# Patient Record
Sex: Female | Born: 1940
Health system: Southern US, Community
[De-identification: ages and names within clinical notes are randomized; demographics above are authoritative.]

## PROBLEM LIST (undated history)

## (undated) DIAGNOSIS — I779 Disorder of arteries and arterioles, unspecified: Secondary | ICD-10-CM

## (undated) DIAGNOSIS — Z860100 Personal history of colon polyps, unspecified: Secondary | ICD-10-CM

## (undated) DIAGNOSIS — M1990 Unspecified osteoarthritis, unspecified site: Secondary | ICD-10-CM

## (undated) DIAGNOSIS — Z87442 Personal history of urinary calculi: Secondary | ICD-10-CM

## (undated) DIAGNOSIS — Z8719 Personal history of other diseases of the digestive system: Secondary | ICD-10-CM

## (undated) DIAGNOSIS — I82A11 Acute embolism and thrombosis of right axillary vein: Secondary | ICD-10-CM

## (undated) DIAGNOSIS — Z8709 Personal history of other diseases of the respiratory system: Secondary | ICD-10-CM

## (undated) DIAGNOSIS — K56609 Unspecified intestinal obstruction, unspecified as to partial versus complete obstruction: Secondary | ICD-10-CM

## (undated) DIAGNOSIS — K219 Gastro-esophageal reflux disease without esophagitis: Secondary | ICD-10-CM

## (undated) DIAGNOSIS — I2510 Atherosclerotic heart disease of native coronary artery without angina pectoris: Secondary | ICD-10-CM

## (undated) DIAGNOSIS — G8929 Other chronic pain: Secondary | ICD-10-CM

## (undated) DIAGNOSIS — J449 Chronic obstructive pulmonary disease, unspecified: Secondary | ICD-10-CM

## (undated) DIAGNOSIS — I10 Essential (primary) hypertension: Secondary | ICD-10-CM

## (undated) DIAGNOSIS — F32A Depression, unspecified: Secondary | ICD-10-CM

## (undated) DIAGNOSIS — K2270 Barrett's esophagus without dysplasia: Secondary | ICD-10-CM

## (undated) DIAGNOSIS — T4145XA Adverse effect of unspecified anesthetic, initial encounter: Secondary | ICD-10-CM

## (undated) DIAGNOSIS — I639 Cerebral infarction, unspecified: Secondary | ICD-10-CM

## (undated) DIAGNOSIS — E785 Hyperlipidemia, unspecified: Secondary | ICD-10-CM

## (undated) DIAGNOSIS — M2550 Pain in unspecified joint: Secondary | ICD-10-CM

## (undated) DIAGNOSIS — M542 Cervicalgia: Secondary | ICD-10-CM

## (undated) DIAGNOSIS — C801 Malignant (primary) neoplasm, unspecified: Secondary | ICD-10-CM

## (undated) DIAGNOSIS — E43 Unspecified severe protein-calorie malnutrition: Secondary | ICD-10-CM

## (undated) DIAGNOSIS — I739 Peripheral vascular disease, unspecified: Secondary | ICD-10-CM

## (undated) DIAGNOSIS — D649 Anemia, unspecified: Secondary | ICD-10-CM

## (undated) DIAGNOSIS — J9601 Acute respiratory failure with hypoxia: Secondary | ICD-10-CM

## (undated) DIAGNOSIS — J189 Pneumonia, unspecified organism: Secondary | ICD-10-CM

## (undated) DIAGNOSIS — R011 Cardiac murmur, unspecified: Secondary | ICD-10-CM

## (undated) DIAGNOSIS — I629 Nontraumatic intracranial hemorrhage, unspecified: Secondary | ICD-10-CM

## (undated) DIAGNOSIS — R42 Dizziness and giddiness: Secondary | ICD-10-CM

## (undated) DIAGNOSIS — M549 Dorsalgia, unspecified: Secondary | ICD-10-CM

## (undated) DIAGNOSIS — F329 Major depressive disorder, single episode, unspecified: Secondary | ICD-10-CM

## (undated) DIAGNOSIS — T8859XA Other complications of anesthesia, initial encounter: Secondary | ICD-10-CM

## (undated) HISTORY — PX: COLON RESECTION: SHX5231

## (undated) HISTORY — DX: Unspecified intestinal obstruction, unspecified as to partial versus complete obstruction: K56.609

## (undated) HISTORY — PX: CHOLECYSTECTOMY: SHX55

## (undated) HISTORY — DX: Atherosclerotic heart disease of native coronary artery without angina pectoris: I25.10

## (undated) HISTORY — DX: Anemia, unspecified: D64.9

## (undated) HISTORY — DX: Unspecified severe protein-calorie malnutrition: E43

## (undated) HISTORY — PX: APPENDECTOMY: SHX54

## (undated) HISTORY — PX: TRACHEOSTOMY: SUR1362

## (undated) HISTORY — PX: OTHER SURGICAL HISTORY: SHX169

## (undated) HISTORY — PX: COLON SURGERY: SHX602

## (undated) HISTORY — DX: Hyperlipidemia, unspecified: E78.5

## (undated) HISTORY — DX: Acute respiratory failure with hypoxia: J96.01

## (undated) HISTORY — PX: ABDOMINAL HYSTERECTOMY: SHX81

## (undated) HISTORY — DX: Acute embolism and thrombosis of right axillary vein: I82.A11

## (undated) HISTORY — DX: Essential (primary) hypertension: I10

## (undated) HISTORY — PX: HAND SURGERY: SHX662

## (undated) HISTORY — PX: FOOT SURGERY: SHX648

## (undated) HISTORY — DX: Malignant (primary) neoplasm, unspecified: C80.1

## (undated) HISTORY — PX: TONSILLECTOMY: SUR1361

## (undated) HISTORY — DX: Nontraumatic intracranial hemorrhage, unspecified: I62.9

## (undated) HISTORY — PX: ROTATOR CUFF REPAIR: SHX139

---

## 2003-04-24 ENCOUNTER — Ambulatory Visit (HOSPITAL_COMMUNITY): Admission: RE | Admit: 2003-04-24 | Discharge: 2003-04-24 | Payer: Self-pay | Admitting: Orthopedic Surgery

## 2003-04-24 ENCOUNTER — Ambulatory Visit (HOSPITAL_BASED_OUTPATIENT_CLINIC_OR_DEPARTMENT_OTHER): Admission: RE | Admit: 2003-04-24 | Discharge: 2003-04-24 | Payer: Self-pay | Admitting: Orthopedic Surgery

## 2003-08-30 ENCOUNTER — Ambulatory Visit (HOSPITAL_COMMUNITY): Admission: RE | Admit: 2003-08-30 | Discharge: 2003-08-30 | Payer: Self-pay | Admitting: Neurosurgery

## 2004-05-16 ENCOUNTER — Ambulatory Visit: Payer: Self-pay | Admitting: Cardiology

## 2004-05-20 ENCOUNTER — Ambulatory Visit: Payer: Self-pay | Admitting: Cardiology

## 2004-05-24 ENCOUNTER — Inpatient Hospital Stay (HOSPITAL_BASED_OUTPATIENT_CLINIC_OR_DEPARTMENT_OTHER): Admission: RE | Admit: 2004-05-24 | Discharge: 2004-05-24 | Payer: Self-pay | Admitting: Cardiology

## 2004-05-24 ENCOUNTER — Ambulatory Visit: Payer: Self-pay | Admitting: *Deleted

## 2004-05-30 ENCOUNTER — Ambulatory Visit: Payer: Self-pay

## 2004-05-30 ENCOUNTER — Ambulatory Visit: Payer: Self-pay | Admitting: Internal Medicine

## 2006-04-02 ENCOUNTER — Ambulatory Visit: Payer: Self-pay | Admitting: Cardiology

## 2006-04-17 ENCOUNTER — Ambulatory Visit: Payer: Self-pay

## 2006-04-27 ENCOUNTER — Ambulatory Visit: Payer: Self-pay | Admitting: Cardiology

## 2006-10-30 ENCOUNTER — Ambulatory Visit: Payer: Self-pay

## 2006-11-05 ENCOUNTER — Ambulatory Visit: Payer: Self-pay | Admitting: Cardiology

## 2007-04-29 ENCOUNTER — Ambulatory Visit: Payer: Self-pay

## 2007-05-04 ENCOUNTER — Ambulatory Visit: Payer: Self-pay | Admitting: Cardiology

## 2007-10-27 ENCOUNTER — Ambulatory Visit: Payer: Self-pay | Admitting: Cardiology

## 2007-11-01 ENCOUNTER — Ambulatory Visit: Payer: Self-pay

## 2008-04-17 DIAGNOSIS — I739 Peripheral vascular disease, unspecified: Secondary | ICD-10-CM

## 2008-04-17 DIAGNOSIS — E782 Mixed hyperlipidemia: Secondary | ICD-10-CM

## 2008-04-24 ENCOUNTER — Encounter: Payer: Self-pay | Admitting: Cardiology

## 2008-04-24 ENCOUNTER — Ambulatory Visit: Payer: Self-pay | Admitting: Cardiology

## 2008-05-03 ENCOUNTER — Inpatient Hospital Stay (HOSPITAL_COMMUNITY): Admission: EM | Admit: 2008-05-03 | Discharge: 2008-05-05 | Payer: Self-pay | Admitting: Cardiology

## 2008-05-03 ENCOUNTER — Ambulatory Visit: Payer: Self-pay | Admitting: Cardiology

## 2008-05-04 ENCOUNTER — Encounter: Payer: Self-pay | Admitting: Cardiology

## 2008-08-31 ENCOUNTER — Encounter (INDEPENDENT_AMBULATORY_CARE_PROVIDER_SITE_OTHER): Payer: Self-pay | Admitting: *Deleted

## 2008-09-11 ENCOUNTER — Encounter: Admission: RE | Admit: 2008-09-11 | Discharge: 2008-09-11 | Payer: Self-pay

## 2008-09-29 ENCOUNTER — Encounter: Payer: Self-pay | Admitting: Cardiology

## 2008-10-27 ENCOUNTER — Encounter (INDEPENDENT_AMBULATORY_CARE_PROVIDER_SITE_OTHER): Payer: Self-pay | Admitting: *Deleted

## 2008-11-08 ENCOUNTER — Ambulatory Visit: Payer: Self-pay | Admitting: Cardiology

## 2008-11-08 ENCOUNTER — Ambulatory Visit: Payer: Self-pay

## 2008-11-14 ENCOUNTER — Telehealth: Payer: Self-pay | Admitting: Cardiology

## 2009-11-06 DIAGNOSIS — I2510 Atherosclerotic heart disease of native coronary artery without angina pectoris: Secondary | ICD-10-CM | POA: Insufficient documentation

## 2009-11-07 ENCOUNTER — Encounter: Payer: Self-pay | Admitting: Cardiology

## 2009-11-08 ENCOUNTER — Ambulatory Visit: Payer: Self-pay | Admitting: Cardiology

## 2009-11-08 ENCOUNTER — Encounter: Payer: Self-pay | Admitting: Cardiology

## 2009-11-08 ENCOUNTER — Ambulatory Visit: Payer: Self-pay

## 2009-11-08 DIAGNOSIS — F17200 Nicotine dependence, unspecified, uncomplicated: Secondary | ICD-10-CM | POA: Insufficient documentation

## 2010-02-19 NOTE — Miscellaneous (Signed)
Summary: Orders Update  Clinical Lists Changes  Orders: Added new Test order of Carotid Duplex (Carotid Duplex) - Signed 

## 2010-02-19 NOTE — Assessment & Plan Note (Signed)
Summary: per check out/sf  Medications Added NITROSTAT 0.4 MG SUBL (NITROGLYCERIN) 1 tablet under tongue at onset of chest pain; you may repeat every 5 minutes for up to 3 doses.        Visit Type:  1 yr f/u  CC:  pt offers no cardiac complaints today.  Current Medications (verified): 1)  Aspirin 81 Mg Tbec (Aspirin) .... Take One Tablet By Mouth Daily 2)  Zoloft 100 Mg Tabs (Sertraline Hcl) .Marland Kitchen.. 1 Tab Two Times A Day 3)  Tylenol Pm Extra Strength 500-25 Mg Tabs (Diphenhydramine-Apap (Sleep)) .... Use As Directed At Bedtime 4)  Metoprolol Succinate 25 Mg Xr24h-Tab (Metoprolol Succinate) .Marland Kitchen.. 1 Tab Once Daily 5)  Fish Oil 1000 Mg Caps (Omega-3 Fatty Acids) .Marland Kitchen.. 1 Cap Once Daily 6)  Omeprazole 20 Mg Cpdr (Omeprazole) .... 2 Tab Once Daily 7)  Simvastatin 80 Mg Tabs (Simvastatin) .Marland Kitchen.. 1 Tab At Bedtime 8)  Folic Acid 400 Mcg Tabs (Folic Acid) .Marland Kitchen.. 1 Tab Once Daily 9)  Spiriva Handihaler 18 Mcg Caps (Tiotropium Bromide Monohydrate) .... Use As Directed Every Other Day 10)  Nitrostat 0.4 Mg Subl (Nitroglycerin) .Marland Kitchen.. 1 Tablet Under Tongue At Onset of Chest Pain; You May Repeat Every 5 Minutes For Up To 3 Doses.  Allergies: 1)  ! Sulfa 2)  ! * Lyrica  Vital Signs:  Patient profile:   70 year old female Height:      61 inches Weight:      175 pounds BMI:     33.19 Pulse rate:   69 / minute Pulse rhythm:   irregular BP sitting:   140 / 78  (left arm) Cuff size:   large  Vitals Entered By: Danielle Rankin, CMA (November 08, 2009 10:45 AM)  Clinical Reports Reviewed:  Cardiac Cath:  05/24/2004: Cardiac Cath Findings:  ASSESSMENT:  1.  Nonobstructive coronary disease with the exception of the possibility of      the branches in the circumflex distribution.  2.  Normal left ventricular systolic function.  3.  Coronary disease in the obtuse marginal of unknown hemodynamic      significance.   PLAN:  1.  I think it would be reasonable to do a perfusion imaging study on this  lady and the lateral wall is a relatively easy wall to see and if there      is significant ischemia, this would be relatively obvious as the obtuse      marginal is a reasonably good sized vessel and there is a relatively      large territory seen.   1.  I think continue her on her current medications and follow up with Dr.      Diona Browner in two weeks for routine followup.   JH/MEDQ  D:  05/24/2004  T:  05/24/2004  Job:  [id number]  Carotid Doppler:  11/08/2008:  Impressions: Stable 60-79% bilateral ICA stenosis over serial exams.  Charlton Haws, MD  11/01/2007:  Impressions: Stable carotid artery disease over serial exams, with mild to moderate plaque. 60-79% bilateral ICA stenosis, low end of range.  Charlton Haws MD  04/29/2007:  Impressions Stable bilateral carotid disease with moderate plaque. Tortuos mid to distal ICA's 60-79% bilatral ICA stenoses.  Charlton Haws MD  Nuclear Study:  11/01/2007:  Excerise capacity: Adenosine study with no exercise  Blood Pressure response:  Clinical symptoms:  ECG impression: No significant ST segment change suggestive of ischemia  Overall impression: Low risk adenosine nuclear study with soft tissue attenuation anteriorly  but no ischemia.  Olga Millers, MD   04/17/2006:  Excerise capacity: Adenosine study with no exercise  Blood Pressure response: Normal blood pressure response  Clinical symptoms: SOB  ECG impression: No significant ST segement change suggestive of ischemia  Overall impression: Normal stress nuclear study.  Willa Rough, MD    Impression & Recommendations:  Problem # 1:  CAD, NATIVE VESSEL (ICD-414.01)  Her updated medication list for this problem includes:    Aspirin 81 Mg Tbec (Aspirin) .Marland Kitchen... Take one tablet by mouth daily    Metoprolol Succinate 25 Mg Xr24h-tab (Metoprolol succinate) .Marland Kitchen... 1 tab once daily    Nitrostat 0.4 Mg Subl (Nitroglycerin) .Marland Kitchen... 1 tablet under tongue at onset of chest pain;  you may repeat every 5 minutes for up to 3 doses.  Her updated medication list for this problem includes:    Aspirin 81 Mg Tbec (Aspirin) .Marland Kitchen... Take one tablet by mouth daily    Metoprolol Succinate 25 Mg Xr24h-tab (Metoprolol succinate) .Marland Kitchen... 1 tab once daily    Nitrostat 0.4 Mg Subl (Nitroglycerin) .Marland Kitchen... 1 tablet under tongue at onset of chest pain; you may repeat every 5 minutes for up to 3 doses.  Problem # 2:  CAROTID ARTERY STENOSIS, WITHOUT INFARCTION (ICD-433.10)  Her updated medication list for this problem includes:    Aspirin 81 Mg Tbec (Aspirin) .Marland Kitchen... Take one tablet by mouth daily  Her updated medication list for this problem includes:    Aspirin 81 Mg Tbec (Aspirin) .Marland Kitchen... Take one tablet by mouth daily  Problem # 3:  HYPERLIPIDEMIA-MIXED (ICD-272.4)  Her updated medication list for this problem includes:    Simvastatin 80 Mg Tabs (Simvastatin) .Marland Kitchen... 1 tab at bedtime  Her updated medication list for this problem includes:    Simvastatin 80 Mg Tabs (Simvastatin) .Marland Kitchen... 1 tab at bedtime Prescriptions: NITROSTAT 0.4 MG SUBL (NITROGLYCERIN) 1 tablet under tongue at onset of chest pain; you may repeat every 5 minutes for up to 3 doses.  #25 x 7   Entered by:   Danielle Rankin, CMA   Authorized by:   Gaylord Shih, MD, [facility]   Signed by:   Danielle Rankin, CMA on 11/08/2009   Method used:   Electronically to        [facility]  [id number] (retail)       [address]       [place], [place]  [address]       Ph: [phone] or [phone]       Fax: [phone]   RxID:   [id number]   Appended Document: [facility]      Primary Provider:  Nadine Counts MD   History of Present Illness: [name] returns today for evaluation and management of her carotid artery disease, nonobstructive coronary disease, tobacco use, hyperlipidemia, hypertension. She has multiple complaints today but most related to arthritis. With limited mobility, not sure that she has any angina or  ischemia.  She continues to smoke. She denies any claudication.  Preliminary Dopplers of the carotids are stable today.  Clinical Reports Reviewed:  Cardiac Cath:  05/24/2004: Cardiac Cath Findings:  ASSESSMENT:  1.  Nonobstructive coronary disease with the exception of the possibility of      the branches in the circumflex distribution.  2.  Normal left ventricular systolic function.  3.  Coronary disease in the obtuse marginal of unknown hemodynamic      significance.   PLAN:  1.  I think it would be reasonable  to do a perfusion imaging study on this      lady and the lateral wall is a relatively easy wall to see and if there      is significant ischemia, this would be relatively obvious as the obtuse      marginal is a reasonably good sized vessel and there is a relatively      large territory seen.   1.  I think continue her on her current medications and follow up with Dr.      Diona Browner in two weeks for routine followup.   JH/MEDQ  D:  05/24/2004  T:  05/24/2004  Job:  [id number]  Carotid Doppler:  11/08/2008:  Impressions: Stable 60-79% bilateral ICA stenosis over serial exams.  Charlton Haws, MD  11/01/2007:  Impressions: Stable carotid artery disease over serial exams, with mild to moderate plaque. 60-79% bilateral ICA stenosis, low end of range.  Charlton Haws MD  04/29/2007:  Impressions Stable bilateral carotid disease with moderate plaque. Tortuos mid to distal ICA's 60-79% bilatral ICA stenoses.  Charlton Haws MD  Nuclear Study:  11/01/2007:  Excerise capacity: Adenosine study with no exercise  Blood Pressure response:  Clinical symptoms:  ECG impression: No significant ST segment change suggestive of ischemia  Overall impression: Low risk adenosine nuclear study with soft tissue attenuation anteriorly but no ischemia.  Olga Millers, MD   04/17/2006:  Excerise capacity: Adenosine study with no exercise  Blood Pressure response: Normal blood  pressure response  Clinical symptoms: SOB  ECG impression: No significant ST segement change suggestive of ischemia  Overall impression: Normal stress nuclear study.  Willa Rough, MD   Allergies: 1)  ! Sulfa 2)  ! * Lyrica  Past History:  Past Medical History: Last updated: 11/06/2009 CAD, NATIVE VESSEL (ICD-414.01) CAROTID ARTERY STENOSIS, WITHOUT INFARCTION (ICD-433.10) HYPERLIPIDEMIA-MIXED (ICD-272.4) CAROTID ARTERY DISEASE (ICD-433.10)    Past Surgical History: Last updated: 08-May-2008 Appendectomy Cholecystectomy Colon Resection Partial Hysterectomy  Family History: Last updated: May 08, 2008 Mother: deceased in an accident Father: deceased unknown reason Siblings: alive and well..2 brothers and 1 sister  Social History: Last updated: May 08, 2008 Full Time Married  Tobacco Use - Yes.  Alcohol Use - yes Drug Use - no  Risk Factors: Smoking Status: current (05/08/2008)  Review of Systems       negative history of present illness  Physical Exam  General:  obese.   Head:  normocephalic and atraumatic Eyes:  PERRLA/EOM intact; conjunctiva and lids normal. Neck:  Neck supple, no JVD. No masses, thyromegaly or abnormal cervical nodes. Chest Wall:  no deformities or breast masses noted Lungs:  decreased breath sounds throughout Heart:  PMI poorly appreciated, regular rate and rhythm, soft S1-S2, carotids are full without obvious bruits. Msk:  decreased ROM.   Pulses:  diminished but palpable in both extremities. Extremities:  No clubbing or cyanosis.no soft-tissue breakdown or ulcerations. Neurologic:  Alert and oriented x 3. Skin:  Intact without lesions or rashes. Psych:  Normal affect.   Impression & Recommendations:  Problem # 1:  CAD, NATIVE VESSEL (ICD-414.01) Assessment Unchanged  Problem # 2:  CAROTID ARTERY STENOSIS, WITHOUT INFARCTION (ICD-433.10) Assessment: Unchanged stable. No change in treatment. Her updated medication list for  this problem includes:    Aspirin 81 Mg Tbec (Aspirin) .Marland Kitchen... Take one tablet by mouth daily  Problem # 3:  TOBACCO ABUSE (ICD-305.1) advised to quit.  Patient Instructions: 1)  Your physician recommends that you schedule a follow-up appointment in: 1 year---we encourage smoking cessation

## 2010-05-01 LAB — LIPID PANEL
Cholesterol: 137 mg/dL (ref 0–200)
HDL: 43 mg/dL (ref >39)
LDL Cholesterol: 84 mg/dL (ref 0–99)
Total CHOL/HDL Ratio: 3.2 RATIO
VLDL: 10 mg/dL (ref 0–40)

## 2010-05-01 LAB — HEPARIN LEVEL (UNFRACTIONATED): Heparin Unfractionated: 0.46 IU/mL (ref 0.30–0.70)

## 2010-05-01 LAB — BASIC METABOLIC PANEL
BUN: 15 mg/dL (ref 6–23)
CO2: 24 mEq/L (ref 19–32)
Calcium: 8.9 mg/dL (ref 8.4–10.5)
Creatinine, Ser: 0.7 mg/dL (ref 0.4–1.2)
GFR calc Af Amer: >60 mL/min (ref >60)

## 2010-05-01 LAB — CBC
HCT: 35.8 % — ABNORMAL LOW (ref 36.0–46.0)
Hemoglobin: 12.4 g/dL (ref 12.0–15.0)
MCHC: 34.2 g/dL (ref 30.0–36.0)
MCV: 89.6 fL (ref 78.0–100.0)
RBC: 4 MIL/uL (ref 3.87–5.11)
RBC: 3.99 MIL/uL (ref 3.87–5.11)
WBC: 12.5 10*3/uL — ABNORMAL HIGH (ref 4.0–10.5)

## 2010-05-01 LAB — CARDIAC PANEL(CRET KIN+CKTOT+MB+TROPI)
CK, MB: 3.1 ng/mL (ref 0.3–4.0)
Total CK: 117 U/L (ref 7–177)
Total CK: 120 U/L (ref 7–177)
Total CK: 149 U/L (ref 7–177)
Troponin I: <0.01 ng/mL (ref 0.00–0.06)
Troponin I: <0.01 ng/mL (ref 0.00–0.06)

## 2010-05-01 LAB — BRAIN NATRIURETIC PEPTIDE: Pro B Natriuretic peptide (BNP): 109 pg/mL — ABNORMAL HIGH (ref 0.0–100.0)

## 2010-05-15 ENCOUNTER — Other Ambulatory Visit: Payer: Self-pay | Admitting: Cardiology

## 2010-05-15 DIAGNOSIS — I6529 Occlusion and stenosis of unspecified carotid artery: Secondary | ICD-10-CM

## 2010-05-17 ENCOUNTER — Encounter (INDEPENDENT_AMBULATORY_CARE_PROVIDER_SITE_OTHER): Payer: Medicare Other | Admitting: Cardiology

## 2010-05-17 DIAGNOSIS — I6529 Occlusion and stenosis of unspecified carotid artery: Secondary | ICD-10-CM

## 2010-05-20 ENCOUNTER — Encounter: Payer: Self-pay | Admitting: Cardiology

## 2010-05-24 ENCOUNTER — Telehealth: Payer: Self-pay | Admitting: *Deleted

## 2010-05-24 NOTE — Telephone Encounter (Signed)
Message copied by Lisabeth Devoid on Fri May 24, 2010  2:50 PM ------      Message from: Valera Castle      Created: Thu May 23, 2010  9:17 AM       Stable, followup in 1 year

## 2010-05-24 NOTE — Telephone Encounter (Signed)
Pt aware of carotid doppler results. Debbie Stein Windhorst RN  

## 2010-06-04 NOTE — Assessment & Plan Note (Signed)
[facility]                            CARDIOLOGY OFFICE NOTE   NAME:Mcleish, KRUPA STEGE                  MRN:          [id number]  DATE:11/05/2006                            DOB:          [birth year]    PRIMARY CARE PHYSICIAN:  Nadine Counts, MD   REASON FOR VISIT:  Follow up coronary artery disease.   Ms. Courts is doing relatively well.  She is not having any problems  with progressive angina or dyspnea.  She did not tolerate Imdur even at  15 mg daily due to headaches, and this was discontinued.  Her  electrocardiogram today is normal, showing sinus rhythm at 80 beats per  minute.  She had follow-up carotid Dopplers revealing moderate bilateral  internal carotid disease at 60-79% with recommended follow-up in 6  months.   Most recent ischemic evaluation was with a Myoview in March of this year  demonstrating normal perfusion and an ejection fraction of 66%.   ALLERGIES:  SULFA DRUGS.   MEDICATIONS:  1. Nexium 40 mg p.o. daily.  2. Aspirin 81 mg p.o. daily.  3. Wellbutrin SR 150 mg p.o. b.i.d.  4. Zoloft 100 mg p.o. b.i.d.  5. Tylenol P.M. q.h.s.  6. Simvastatin 80 mg p.o. q.p.m.  7. Toprol XL 25 mg p.o. daily.  8. Folic acid.  9. Omega-3 supplements.   REVIEW OF SYSTEMS:  As described in the history of present illness.  She  has had no active bleeding problems.  She does have some bruises on her  right forearm following recent fall when she tripped.   EXAMINATION:  Blood pressure 112/68, heart rate is 80, weight is 166  pounds.  The patient is comfortable and in no acute distress.  Examination of the neck reveals soft right bruits, no thyromegaly, no  elevated jugular venous pressure.  LUNGS:  Clear.  Diminished breath sounds.  CARDIAC:  A regular rate and rhythm.  No rub, murmur or gallop.  EXTREMITIES:  No pitting edema.   IMPRESSION AND RECOMMENDATIONS:  1. Moderate coronary artery disease as outlined at cardiac  catheterization in 2006 with nonischemic Myoview earlier this year.      I plan to continue the medical therapy and observation.  I will      plan to see her back over the next 6 months.  2. Carotid artery disease, moderate and bilateral.  Follow up      ultrasound in 6 months.  3. Hyperlipidemia, on simvastatin.  Goal LDL should be around 70.     Jonelle Sidle, MD  Electronically Signed    SGM/MedQ  DD: 11/05/2006  DT: 11/06/2006  Job #: [id number]   cc:   Nadine Counts

## 2010-06-04 NOTE — H&P (Signed)
NAMEZELIA, YZAGUIRRE NO.:  [id number]   MEDICAL RECORD NO.:  [id number]          PATIENT TYPE:  INP   LOCATION:  2919                         FACILITY:  [facility]   PHYSICIAN:  Marca Ancona, MD      DATE OF BIRTH:  03/18/1940   DATE OF ADMISSION:  05/03/2008  DATE OF DISCHARGE:                              HISTORY & PHYSICAL   PRIMARY CARDIOLOGIST:  Maisie Fus C. Daleen Squibb, MD, [facility]   PRIMARY CARE PHYSICIAN:  Nadine Counts.   HISTORY OF PRESENT ILLNESS:  This is a 70 year old woman with a history  of moderate coronary artery disease, carotid stenosis, and smoking who  was transferred to [facility] because of chest tightness.  The  patient states that she has had a cough for about a month.  She had  attributed this to allergy.  She was given Claritin and Mucinex for the  cough.  Her cough has continued and has been nonproductive.  However,  over the last 2 days, in addition to cough, she has developed tightness  across her chest.  This has waxed and waned, but has never totally  resolved over the last 2 days.  She states the exertion seems to make  the tightness worse; however, cough also makes the tightness worse, deep  breathing is not making it worse.  The patient also reports increased  shortness of breath over the last 2 days.  She has had no orthopnea or  paroxysmal nocturnal dyspnea.  She does report shortness of breath when  climbing up a flight of steps or trying to walk fast.  She has had no  fever.  She went to the Emergency Department of [facility] with  the chest tightness.  There they were concerned for ischemic pain.  She  also appears to have been wheezing when she was in the emergency  department, and she got steroids.  While in the ER, she received  Dilaudid, nitroglycerin, aspirin, Solu-Medrol 125 mg IV, albuterol nebs,  Lopressor 25 mg, nitroglycerin paste and drip, and levofloxacin 750 mg  p.o. daily.  She had chest x-ray done, that had a  poor inspiration, but  was concerning for perihilar and bibasilar interstitial edema versus  infiltrates as well as left retrocardiac consolidation.  She had a set  of cardiac enzymes done that were negative, and her EKG was  unremarkable.   ALLERGIES:  SULFA.   MEDICATIONS:  1. Aspirin 81 mg daily.  2. Wellbutrin SR 150 mg b.i.d.  3. Zoloft 100 mg b.i.d.  4. Toprol-XL 25 mg daily.  5. Fish oil 1000 mg daily.  6. Omeprazole 20 mg daily.  7. Zocor 80 mg daily.  8. Claritin p.r.n.  9. Mucinex p.r.n.  10.The patient was on a heparin drip and nitroglycerin drip at the      time of arrival.   PAST MEDICAL HISTORY:  1. Coronary artery disease.  The patient had a left heart cath in May      2006 with a 50% mid LAD stenosis, 60-70% mid obtuse marginal-1      stenosis, 47% mid RCA stenosis, and 30% distal  RCA stenosis.  EF      was 60-65.  The patient was medically managed.  She did have a      Myoview done after this cath in the same month that showed no      evidence for ischemia.  The patient had atypical-type chest pain in      March 2008, and she had no evidence for ischemia on an Adenosine      Myoview.  The patient again had atypical chest pain in October of      2009.  She had an Adenosine Myoview at that time, that again showed      no ischemia.  2. Hyperlipidemia.  3. Carotid stenosis, most recent carotid ultrasound was in October of      2009 showed bilateral 60-79% internal carotid artery stenosis.  4. Tobacco abuse.  The patient has no known history of COPD.   SOCIAL HISTORY:  The patient lives in [place] with her husband.  She is  retired.  She smokes half a pack a day.  She rarely uses alcohol.   FAMILY HISTORY:  The patient's father had MI at the age of 27.   REVIEW OF SYSTEMS:  All systems were reviewed and are negative except as  noted in the history of present illness.   PHYSICAL EXAMINATION:  VITAL SIGNS:  Initial vital signs at [facility], blood  pressure 189/81, pulse is 85 and regular, temperature  97.4, and oxygen saturation 97% on room air.  Vitals at [facility] upon  transfer, pulse in the 70s and regular, blood pressure 120/60, O2  saturation 93% on 2 L nasal cannula.  GENERAL:  This is an obese female in no apparent distress.  HEENT:  Normal exam.  ABDOMEN:  Soft and nontender.  No hepatosplenomegaly.  Obese.  Normal  bowel sounds.  NECK:  Supple without lymphadenopathy.  No thyromegaly.  JVP appears to  be mildly elevated about 10 cm of water.  CARDIOVASCULAR:  Heart, regular.  S1, S2.  No S3.  No S4.  There is a  2/6 mid-peaking systolic murmur at the right upper sternal border.  S2  is somewhat obscured.  There is 1+ right dorsalis pedis pulse and a 2+  left dorsalis pedis pulses.  There is trace ankle edema.  EXTREMITIES:  There is no clubbing or cyanosis.  LUNGS:  Decreased breath sounds bilaterally with no evidence now for  wheezes or crackles.  SKIN:  No rash.  MUSCULOSKELETAL:  Normal exam.  NEUROLOGIC:  Alert and oriented x3.  Normal affect.   IMAGING STUDIES:  CT of the head at [facility] showed no acute  findings.  Portable chest x-ray at [facility] report is showing  cardiomegaly, perihilar, and bibasilar interstitial edema versus  infiltrates, and left retrocardiac consolidation.  EKG was reviewed,  shows normal sinus rhythm.  It is a normal EKG.   LABORATORY DATA:  From [facility], hematocrit 34.4, platelets  223.  Potassium 3.4, creatinine 0.66. LFTs normal.  First set of cardiac  enzymes negative.  INR 1.   ASSESSMENT AND PLAN:  This is a 70 year old with history of moderate  coronary artery disease and carotid stenosis who presented to [facility] with chest tightness and shortness of breath.  1. Chest tightness.  This has been waxing and waning for the last 2      days.  It is never completely resolved.  EKG is normal.  First set  of cardiac enzymes is negative.  The  tightness is worse with      exertion, but it also seems to be worse with cough.  There is some      suggestion this could be a COPD exacerbation or pneumonia.  Based      on the wheezes heard initially on exam in the ER, and the chest x-      ray showing possible infiltrates, the patient did get Solu-Medrol      and albuterol at [facility] and she is no longer wheezing.      The patient did have moderate coronary artery disease on a cath in      May of 2006.  She does have a history of atypical chest pain and      has had negative Myoview x3 over the last several years.  We will      plan on cycling her cardiac enzymes.  She will be on aspirin,      statin, and metoprolol.  We will continue the heparin and      nitroglycerin drips for now.  We will need to reassess tomorrow and      decide on whether or not to send her for cardiac catheterization in      the morning.  Would not do another Myoview given her negative      Myoviews several times in the past with atypical symptoms.  I think      we should wait to make a decision based on PA and lateral chest x-      ray, BNP, and cardiac enzymes.  2. Pulmonary.  The patient apparently was wheezing at [facility].  This      resolved with the use of Solu-Medrol and albuterol nebulizers.  So,      I do question whether there is a component of COPD exacerbation      causing the chronic cough and the chest tightness.  Also, the chest      x-ray read from [facility] is somewhat equivocal.  It      does describe pulmonary edema versus pneumonia on the chest x-ray.      Therefore, we are going to repeat a chest x-ray at this time doing      a PA and lateral.  We will start her on moxifloxacin for possible      pneumonia.  We will continue her steroids at a lower dose for COPD      for now.  We will have her on Atrovent nebulizers though the      patient is actually not wheezing now.  3. Volume status.  The patient does appear a  bit overloaded with      somewhat elevated JVP; however, her exam is difficult, we will not      give her any IV fluids, we will check a BNP.  4. Murmur.  The patient's murmur does sound somewhat like an aortic-      type ejection murmur.  S2 is a bit obscured. I think we did check      an echocardiogram to assess for any evidence of significant aortic      stenosis.  5. Carotid stenosis.  The patient has had no TIA-like symptoms.  Most      recent carotid Doppler showed stable disease.      Marca Ancona, MD  Electronically Signed     DM/MEDQ  D:  05/03/2008  T:  05/04/2008  Job:  [id number]

## 2010-06-04 NOTE — Assessment & Plan Note (Signed)
[facility]                            CARDIOLOGY OFFICE NOTE   NAME:Cordova, DEWAYNE JUREK                  MRN:          [id number]  DATE:10/27/2007                            DOB:          [birth year]    Ms. Hirota comes today for followup of the following issues;  1. Nonobstructive coronary artery disease.  This was by cath in May      2006.  2. Normal left ventricular function.  3. Carotid artery disease.  Her last carotid Doppler showed 60-79%      bilateral internal carotid artery stenoses with antegrade flow in      both vertebrals.  4. Hyperlipidemia being treated with simvastatin by Dr. Nadine Counts.  5. Tobacco use.   Her biggest complaint is having intermittent numbness in her left upper  extremity.  There are no other associated neurological symptoms with  careful questioning.  It usually lasts about 4 minutes and goes away.   She has had some chest tightness with no exertion.  She does not get  around that well because of plantar fasciitis in the left foot.   CURRENT MEDICATIONS:  1. Aspirin 81 mg a day.  2. Wellbutrin 150 mg p.o. b.i.d.  3. Zoloft 100 mg b.i.d.  4. Tylenol nightly.  5. Metoprolol succinate 25 mg a day.  6. Fish oil 1000 mg a day.  7. Omeprazole 40 mg a day.  8. Simvastatin 80 mg a day.  9. Folic acid 400 mg per day.   PHYSICAL EXAMINATION:  VITAL SIGNS:  Her blood pressure today was  145/89.  Her pulse is 104 and regular.  Her weight is 167, up 3.  Respiratory rate is 18 and unlabored.  HEENT:  Normal.  GENERAL:  She is alert and oriented x3.  NECK:  Supple.  Carotid upstrokes were equal bilaterally with soft  bilateral bruits.  Thyroid is not enlarged.  Trachea is midline.  There  is no obvious JVD.  LUNGS:  Some mild inspiratory and expiratory rhonchi.  HEART:  Nondisplaced PMI.  Normal S1 and S2.  No gallop.  ABDOMEN:  Soft.  Good bowel sounds.  EXTREMITIES:  Minimal to trace edema.  Pulses were present  both dorsalis  pedis and posterior tibial.  There is no sign of DVT.  NEUROLOGIC:  Intact.  Gait is normal.  SKIN:  Unremarkable.   ASSESSMENT/PLAN:  Ms. Gaillard may be having intermittent transient  ischemic attacks of the left upper extremity.  She is due a carotid  Doppler next week which is already scheduled for Monday.  In addition,  she has had some chest tightness with no objective assessment of her  coronaries in a couple of years.  We will perform an adenosine Myoview.   I spent a lot of time talking to her and her husband who are both  smokers.  I have encouraged her to stop.  This would greatly reduce her  risk of stroke or an acute coronary syndrome.     Thomas C. Daleen Squibb, MD, [facility]  Electronically Signed    TCW/MedQ  DD: 10/27/2007  DT: 10/28/2007  Job #:  [id number]   cc:   Nadine Counts

## 2010-06-04 NOTE — Discharge Summary (Signed)
[name], HADA NO.:  [id number]   MEDICAL RECORD NO.:  [id number]          PATIENT TYPE:  INP   LOCATION:  3706                         FACILITY:  [facility]   PHYSICIAN:  Veverly Fells. Excell Seltzer, MD  DATE OF BIRTH:  [birth year]   DATE OF ADMISSION:  05/03/2008  DATE OF DISCHARGE:  05/05/2008                               DISCHARGE SUMMARY   PRIMARY CARDIOLOGIST:  Maisie Fus C. Daleen Squibb, MD, [facility]   PRIMARY CARE Oree Mirelez:  Dr. Nadine Counts   DISCHARGE DIAGNOSIS:  Acute on chronic bronchitis/chronic obstructive  pulmonary disease.   SECONDARY DIAGNOSES:  1. History of nonobstructive coronary artery disease.  2. History of atypical chest pain with multiple negative Myoviews.  3. Hyperlipidemia.  4. History of carotid stenosis.  5. Ongoing tobacco abuse.   ALLERGIES:  SULFA.   PROCEDURES:  A 2-D echocardiogram performed on May 04, 2008, showing  EF of 55-65% without regional wall motion abnormalities.  Chest x-ray of  May 04, 2008, showed no CHF.  Cardiomegaly.  Minimal atelectasis of  the right middle lobe and mild chronic bronchitic changes.   HISTORY OF PRESENT ILLNESS:  A 70 year old Caucasian female with the  above problem list.  She had a cough which is nonproductive for some  time.  Over a 48-hour period prior to admission, she developed chest  tightness across her chest which was intermittent and associated with  wheezing.  The tightness was also pleuritic in nature.  Because of  ongoing symptoms, she presented to the [facility] Emergency Room  where she was treated with Dilaudid, nitroglycerin, aspirin, Solu-  Medrol, albuterol, Lopressor, nitroglycerin paste, subsequent infusion,  and Levaquin.  Chest x-ray showed poor inspiration and was concerning  for perihilar and bibasilar interstitial edema versus infiltrate as well  as left retrocardiac consolidation.  Enzymes were negative.  The patient  was transferred to [facility] for further  evaluation.   HOSPITAL COURSE:  The patient ruled out for MI.  Followup ECG showed  chronic bronchitic changes without edema.  She continued to have cough  and was noted to have expiratory wheezing with forceful cough.  Secondary to this, she was maintained on oral prednisone and also placed  on inhaler therapy and Avelox.  A 2-D echocardiogram was performed on  May 04, 2008, showing normal LV function.   On above therapy, she has had improvement.  She continues to cough.  However, this remains nonproductive.  She has been afebrile.  We plan to  discharge her home today in good condition.  We have initiated Combivent  inhaler as well as prednisone taper and she will continue Avelox for a  total of 7 days.   DISCHARGE LABORATORY DATA:  Hemoglobin 12.4, hematocrit 35.6, WBC 12.5,  and platelets 217.  Sodium 142, potassium 4.0, chloride 109, CO2 of 24,  BUN 15, creatinine 0.70, and glucose 163.  CK 149, MB 9.0, and troponin  I less than 0.01.  Total cholesterol 137, triglycerides 48, HDL 43, and  LDL 84.  TSH 0.501.   DISPOSITION:  The patient will be discharged home today in good  condition.  FOLLOWUP PLANS AND APPOINTMENT:  The patient is to follow up with Dr.  Nadine Counts in the next 1-2 weeks.   DISCHARGE MEDICATIONS:  1. Aspirin 81 mg daily.  2. Wellbutrin 150 mg b.i.d.  3. Zoloft 100 mg b.i.d.  4. Toprol-XL 25 mg daily.  5. Fish oil 1000 mg daily.  6. Omeprazole 20 mg daily.  7. Zocor 80 mg nightly.  8. Claritin 10 mg daily.  9. Combivent MDI of 120/21 mcg per spray 2 puffs q.i.d.  10.Prednisone 10 mg 4 tabs x2 days, then 3 tabs x2, then 2 tabs x2      days, and 1 tab x2 days.  11.Avelox 400 mg daily x6 additional days.  12.Tessalon 100 mg t.i.d.  13.Nitroglycerin 0.4 mg sublingual p.r.n. chest pain.   OUTSTANDING LAB STUDIES:  None.   DURATION OF DISCHARGE ENCOUNTER:  60 minutes including physician time.      Nicolasa Ducking, ANP      Veverly Fells.  Excell Seltzer, MD  Electronically Signed    CB/MEDQ  D:  05/05/2008  T:  05/06/2008  Job:  [id number]   cc:   Nadine Counts

## 2010-06-04 NOTE — Assessment & Plan Note (Signed)
[name]                            CARDIOLOGY OFFICE NOTE   NAME:[name], [name]                  MRN:          [id number]  DATE:05/04/2007                            DOB:          [birth year]    PRIMARY CARE PHYSICIAN:  [name] Counts, MD.   REASON FOR VISIT:  Follow up coronary and carotid artery disease.   HISTORY OF PRESENT ILLNESS:  [name] comes back in for a 69-month  visit.  She is not reporting any significant angina.  She does  experience some left shoulder and neck pain which sounds very much  musculoskeletal in description.  Her electrocardiogram today is normal  showing sinus rhythm at 83 beats per minute.  She has had no speech  problems or focal motor weakness.  She had followup carotid Dopplers  done just recently demonstrating stable moderate bilateral carotid  artery disease with 60-79% bilateral internal carotid artery stenoses.  She had no new symptoms otherwise and had a nonischemic Myoview in March  2008.   ALLERGIES:  SOFT DRUGS.   MEDICATIONS:  1. Aspirin 81 mg p.o. b.i.d.  2. Wellbutrin SR 150 mg p.o. b.i.d.  3. Zoloft 100 mg p.o. b.i.d.  4. Tylenol PM.  5. Toprol 25 mg p.o. daily.  6. Omega 3 supplements 1000 mg p.o. daily.  7. Omeprazole 20 mg 2 tablets p.o. daily.  8. Simvastatin 80 mg p.o. daily.  9. Folic acid 400 mcg p.o. daily.   REVIEW OF SYSTEMS:  As described in History of Present Illness.  Occasional reflux.  She states that she is due for a colonoscopy.  Also  reports a history of Barrett's esophagus.  No palpitations, orthopnea,  or progressive chest pain.  Occasional lower extremity edema.  Otherwise  systems are negative.   PHYSICAL EXAMINATION:  VITAL SIGNS:  Blood pressure is 118/86, heart  rate 83 and regular, weight 164 pounds.  GENERAL:  The patient is comfortable and in no acute distress.  HEENT:  Conjunctivae and lids normal.  Oropharynx clear.  NECK:  Supple.  Soft right carotid  bruit.  No thyromegaly.  No thyroid  tenderness.  LUNGS:  Diminished breath sounds, otherwise clear.  CARDIAC:  Exam reveals a regular rate and rhythm.  No loud murmur, S3  gallop.  EXTREMITIES:  Exhibit no pitting edema today.   IMPRESSION AND RECOMMENDATIONS:  1. Moderate cardiovascular disease, stable symptomatically.  We plan      to continue medical therapy at this point.  2. Moderate, 60-79% bilateral internal carotid artery disease,      asymptomatic.  Followup ultrasound is planned in 6 months.     [name] Sidle, MD  Electronically Signed    SGM/MedQ  DD: 05/04/2007  DT: 05/04/2007  Job #: [id number]   cc:   [name]

## 2010-06-07 NOTE — Assessment & Plan Note (Signed)
[facility]                            CARDIOLOGY OFFICE NOTE   NAME:[name], [name] HOLM                  MRN:          [id number]  DATE:04/02/2006                            DOB:          07/08/1940    REFERRING PHYSICIAN:  Nadine Counts   REASON FOR VISIT:  Chest pain.   HISTORY OF PRESENT ILLNESS:  [name] is a 70 year old woman that I  saw back in April 2006 with a history of tobacco use, hyperlipidemia,  and exertional chest pain with dyspnea. She had also been diagnosed with  moderate right carotid artery stenosis with screening testing and given  this we elected to proceed with a diagnostic cardiac catheterization.  This study was performed by Dr. Dorethea Clan and revealed predominantly  nonobstructive coronary artery disease in the major epicardial vessels  including a 50% left anterior descending stenosis, 50% right coronary  artery stenosis, and a 60-70% obtuse marginal stenosis with overall left  ventricular ejection fraction of 60-65%. The patient was referred  subsequently for a Myoview which did not reveal any focal ischemia with  an ejection fraction of 71% prompting recommendation for medical  therapy. I have not seen her back in the office since that time.   She presents now stating that she has had progressive symptoms of chest  discomfort although with somewhat atypical features. She states that she  experiences a discomfort in the left lower part of her chest also up to  her shoulder and upper back which can occur when she puts on her hose or  bends over, also sometimes with other household chores but not  specifically with exertion. These symptoms can sometimes last even up to  2 days at a time without remitting. Her electrocardiogram today shows  normal sinus rhythm at 82 beats per minute. Today I reviewed with the  patient and her  husband her prior cardiac evaluation and after a discussion of the  issues arrived at the plan  outlined below.   ALLERGIES:  SULFA DRUGS.   CURRENT MEDICATIONS:  1. Nexium 40 mg p.o. daily.  2. Aspirin 81 mg p.o. daily.  3. Wellbutrin SR 150 mg p.o. b.i.d.  4. Zoloft 100 mg p.o. b.i.d.  5. Vytorin 10/80 mg p.o. daily.  6. Tylenol PM q.h.s.   REVIEW OF SYSTEMS:  As described in the history of present illness.   PHYSICAL EXAMINATION:  VITAL SIGNS:  Blood pressure is 140/72, heart  rate is 82, weight is 162 pounds.  GENERAL:  The patient is comfortable and in no acute distress.  HEENT:  Conjunctiva is normal. Oropharynx is clear.  NECK:  Supple without elevated jugular venous pressure or loud bruits.  No thyromegaly is noted.  LUNGS:  Clear with diminished breath sounds. No active wheezing.  CARDIAC:  Reveals a regular rate and rhythm. No loud murmur or S3  gallop.  ABDOMEN:  Soft, nontender, no hepatomegaly, no bruits.  EXTREMITIES:  Exhibit no pitting edema. Warm and dry. Distal pulses are  1 to 2+.  MUSCULOSKELETAL:  No kyphosis is noted.  NEUROPSYCHIATRIC:  The patient is alert and oriented x3. Affect  is  normal.   IMPRESSION/RECOMMENDATIONS:  1. Previously documented moderate coronary artery disease as outlined      above with most significant lesion affecting the obtuse marginal      branch of the circumflex. Followup Myoview around that time      demonstrates no focal ischemia prompting medical therapy. Ms.      [name] reports progression in her symptoms although still with      atypical features and her electrocardiogram at rest is normal      today. I recommended a trial of Imdur to see if this affected her      symptoms at all and also a followup Myoview to compare to her prior      study looking for any objective evidence of progression that might      push [place] more towards an attempt at revascularization. She was      comfortable with this and I will plan to see her back over the next      few weeks to review test results.  2. Further plans to  follow.     Jonelle Sidle, MD  Electronically Signed    SGM/MedQ  DD: 04/02/2006  DT: 04/04/2006  Job #: [id number]   cc:   Nadine Counts

## 2010-06-07 NOTE — Cardiovascular Report (Signed)
[name], [name] NO.:  [id number]   MEDICAL RECORD NO.:  [id number]          PATIENT TYPE:  OIB   LOCATION:  6501                         FACILITY:  [facility]   PHYSICIAN:  Vida Roller, M.D.   DATE OF BIRTH:  Oct 09, 1940   DATE OF PROCEDURE:  DATE OF DISCHARGE:                              CARDIAC CATHETERIZATION   PRIMARY CARE PHYSICIAN:  [place]. Des[place] [facility]  [facility].   CARDIOLOGIST:  Simona Huh, M.D.   PROCEDURES PERFORMED:  1.  Left heart catheterization.  2.  Selective coronary angiography.  3.  Left ventriculography.   HISTORY OF PRESENT ILLNESS:  [name] is a 70 year old woman with no  known coronary artery disease, but symptoms concerning for angina.  She was  referred for left heart catheterization.  After obtaining informed consent,  the patient was brought to the cardiac catheterization laboratory in a  fasting state.  She was prepped and draped in the usual sterile manner and  local anesthetic was obtained over the right groin using 1% lidocaine  without epinephrine.  The right femoral artery was cannulated using the  modified Seldinger technique with a 4-French 10 cm sheath and left heart  catheterization was performed using a 4-French Judkins left #4, a 4-French  Judkins right #4, and  a 4-French pigtail catheter.  At the conclusion of  the procedure, the catheter was removed.  The patient was moved back to the  cardiology holding area.  The femoral artery sheath was removed.  Hemostasis  was obtained using direct manual pressure.  At the conclusion of the hold,  there was no evidence of ecchymosis or hematoma formation.  Distal pulses  were intact.  Total fluoroscopic time was 4.3 minutes.  Total ionized  contrast was 100 mL.   RESULTS:  Aortic pressure 130/58 with a mean of 88 mmHg.   Left ventricular pressure 130/3 with an end diastolic pressure of 13 mmHg.   CORONARY ANGIOGRAPHY:  The  left main coronary artery is a short artery which  is angiographically free of disease.   The left anterior descending coronary artery is a moderate caliber of vessel  which is quite tortuous and there is approximately a 50% lesion in the  midportion of the artery.   The circumflex coronary artery is a moderate caliber nondominant vessel  which has a large obtuse marginal which trifurcates along the anterior  lateral wall.  That obtuse marginal has some obstructive disease in the  midportion of the artery, approximately 60 to 70%.  Is of unknown  hemodynamic significance.  Has TIMI 3 flow through it.   The right coronary artery is a moderate caliber dominant vessel which has  multiple 50% lesions throughout its course, mid to distal with a small  posterior descending coronary artery.   Left ventriculogram reveals normal left ventricular systolic function.  Estimated ejection fraction 60 to 65%, normal wall motion abnormalities, 1+  MR.   ASSESSMENT:  1.  Nonobstructive coronary disease with the exception of the possibility of      the branches in the circumflex distribution.  2.  Normal left ventricular systolic function.  3.  Coronary disease in the obtuse marginal of unknown hemodynamic      significance.   PLAN:  1.  I think it would be reasonable to do a perfusion imaging study on this      lady and the lateral wall is a relatively easy wall to see and if there      is significant ischemia, this would be relatively obvious as the obtuse      marginal is a reasonably good sized vessel and there is a relatively      large territory seen.   1.  I think continue her on her current medications and follow up with Dr.      Diona Browner in two weeks for routine followup.      JH/MEDQ  D:  05/24/2004  T:  05/24/2004  Job:  [id number]

## 2010-06-07 NOTE — Op Note (Signed)
NAME:  [name], [name]                     ACCOUNT NO.:  [id number]   MEDICAL RECORD NO.:  [id number]                   PATIENT TYPE:  AMB   LOCATION:  DSC                                  FACILITY:  [facility]   PHYSICIAN:  Feliberto Gottron. Turner Daniels, M.D.                DATE OF BIRTH:  [birth year]   DATE OF PROCEDURE:  04/24/2003  DATE OF DISCHARGE:                                 OPERATIVE REPORT   PREOPERATIVE DIAGNOSES:  Right shoulder impingement syndrome secondary to an  injury at work and right carpal tunnel syndrome, idiopathic.   POSTOPERATIVE DIAGNOSES:  Right shoulder impingement syndrome secondary to  an injury at work and right carpal tunnel syndrome, idiopathic.   OPERATION PERFORMED:  Right shoulder arthroscopic anterior inferior  acromioplasty, distal clavicle excision, debridement of small labral tear  followed by open right carpal tunnel release.   SURGEON:  Feliberto Gottron. Turner Daniels, M.D.   ASSISTANTLaural Benes. Jannet Mantis.   ANESTHESIA:  Interscalene block plus general endotracheal.   ESTIMATED BLOOD LOSS:  Minimal.   FLUIDS REPLACED:  1200 mL of crystalloid.   DRAINS:  None.   TOURNIQUET TIME:  None.   INDICATIONS FOR PROCEDURE:  The patient is a 70 year old woman who injured  her right shoulder at work some months ago and had classic signs and  symptoms of right shoulder impingement syndrome with a 1 cm type 2  subacromial spur and acromioclavicular joint arthritis.  She was treated  with anti-inflammatory medicines, therapy and exercises. These failed. We  tried a cortisone injection.  She got good temporary relief for about 1 to 2  weeks and then the pain came back with a vengeance.  She also had some  radicular pain going down her arm that she had had on and off for many  months and we went ahead and got EMGs and NCVs showing moderate severe right  carpal tunnel syndrome with some axonal drop out and the recommendation was  that she should have carpal tunnel release  sooner rather than later.  The  carpal tunnel, however, was not related to the injury at work and this is  being done under her private insurance carrier.   DESCRIPTION OF PROCEDURE:  The patient was identified by arm band and taken  to the operating room at [facility] where appropriate  anesthetic monitors are attached and right interscalene block anesthesia  induced without difficulty followed by general endotracheal anesthesia.  The  patient was placed in the beach chair position and the right upper extremity  was prepped and draped in the usual sterile fashion from the fingertips to  the hemithorax.  Using a #11 blade, standard portals were then made 1.5 cm  anterior to the acromioclavicular joint, lateral to the junction of the  middle and posterior thirds of the acromion process and posterior to the  posterolateral corner of the acromion process.  The inflow was  placed  anteriorly.  The arthroscope laterally and a 4.2 [place] sucker shaver  posteriorly.  The subacromial space was then filled with normal saline under  gravity pressure from 40 to 60 mm.  Using the [employer] sucker shaver,  subacromial bursectomy accomplished.  Small bleeders were then cauterized  with underwater electrocautery and we then removed the 1 cm type 2 to 3  subacromial spur as well as the inferior distal centimeter of the clavicle.  We visualized the external surface of the rotator cuff and it was actually  in pretty good shape consistent with the MRI scan which showed no full  thickness tearing.  The 4.5 mm hooded vortex bur was then placed anteriorly,  the scope posteriorly and the inflow laterally allowing completion of the  distal clavicle excision and photographic documentation was made of this.  The arthroscope was then inserted into the glenohumeral joint using the  posterior portal with the inflow attached, a small superior anterior labral  tear was encountered and debrided  with a 4.2 [place] sucker shaver.  The  articular cartilage of the glenohumeral joint was in good condition as was  the biceps, subscapularis  and rotator cuff tendons with minimal fraying.  The shoulder was then washed out with normal saline solution.  The  arthroscopic instruments were removed.  We directed our attention to the  carpal tunnel at this point.  The hand and wrist were wrapped with an  Esmarch bandage that was used as a tourniquet for the procedure.  After  exsanguinating the hand and wrist, we went ahead and made a volar  longitudinal incision starting out at the wrist flexion crease, going  distally for about 3 to 4 cm just to the ulnar side of the thenar crease  through the skin and subcutaneous tissue distally.  A small incision was  made in the palmar fascia allowing [place] to enter the carpal tunnel and a Market researcher was used to protect the tendons and nerves in the carpal tunnel  keeping it volar and on the transverse carpal ligament.  The transverse  carpal ligament was then sectioned under direct vision and the incision  extended into the forearm fascia with a black handled scissors using a  ____________ to apply skin traction.  We then explored the carpal tunnel.  No masses or ganglia were found.  The median nerve was noted to neck down as  it went underneath the transverse carpal ligament with an hourglass  configuration.  The wound was then washed out with normal saline solution.  Small bleeders were cauterized with a bipolar.  The skin only was closed  with running 5-0 nylon suture and a dressing of Xeroform, 4 x 4 dressing  sponges and an Ace wrap applied to the right wrist.  To the shoulder,  Xeroform, 4 x 4 dressing sponges and paper tape followed by a medium sling.  Patient awakened, laid supine and taken to the recovery room without  difficulty.                                               Feliberto Gottron. Turner Daniels, M.D.   Ovid Curd  D:  04/24/2003  T:   04/24/2003  Job:  [id number]

## 2010-06-07 NOTE — Assessment & Plan Note (Signed)
 HEALTHCARE                            CARDIOLOGY OFFICE NOTE   NAME:[name], [name]                  MRN:          [id number]  DATE:04/27/2006                            DOB:          03-16-1940    REASON FOR VISIT:  Followup cardiac testing.   HISTORY OF PRESENT ILLNESS:  I saw [name] back in mid March.  Her  history is detailed in my previous note.  I placed her on Imdur 30 mg  daily at that time as an antianginal and she states that this may have  helped some of her chest discomfort although gave her fairly significant  headaches.  Since that time she has called in and her dose was decreased  to 15 mg daily.  She just made this change last week.  I reviewed her  followup studies including a carotid ultrasound revealing 60-79%  bilateral internal carotid artery stenoses with recommended followup in  6 months.  She also underwent an Adenosine Myoview which showed normal  perfusion with a normal ejection fraction of 66%.  I reviewed these with  her today and would anticipate continuing medical therapy at this point.   ALLERGIES:  SULFA DRUGS.   PRESENT MEDICATIONS:  1. Nexium 40 mg p.o. daily.  2. Aspirin 81 mg p.o. daily.  3. Wellbutrin SR 150 mg p.o. b.i.d.  4. Zoloft 100 mg p.o. b.i.d.  5. Vytorin 10/80 mg p.o. daily.  6. Tylenol P.M. q.h.s.  7. Imdur 15 mg p.o. q.a.m.   REVIEW OF SYSTEMS:  As described in  he History of Present Illness.   PHYSICAL EXAMINATION:  Blood pressure is 129/79, heart rate is 96,  weight is 164 pounds.  The patient is comfortable and in no acute  distress.  NECK:  No elevated jugular venous pressure, without bruits, no  thyromegaly was noted.  LUNGS:  Clear without labored breathing at rest.  CARDIAC EXAM:  A regular rate and rhythm without murmur or gallop.  EXTREMITIES:  No significant pitting edema.   IMPRESSION RECOMMENDATION:  1. History of moderate coronary artery disease as discussed  previously.  Followup Myoview showed normal perfusion with normal      ejection fraction.  I would therefore continue medical therapy.      She could certainly still be having some breakthrough angina and if      she does not tolerate Imdur 15 mg taken in the evenings daily we      might discontinue this in favor of a rate controlling medicine such      as low dose beta-blocker or calcium channel blocker.  I will      otherwise plan to see her back over the next 6 months.  2. Carotid artery disease, with planned followup carotid ultrasound in      6 months.     Jonelle Sidle, MD  Electronically Signed    SGM/MedQ  DD: 04/27/2006  DT: 04/27/2006  Job #: [id number]   cc:   Nadine Counts

## 2010-08-27 ENCOUNTER — Other Ambulatory Visit: Payer: Self-pay | Admitting: Cardiology

## 2010-10-22 ENCOUNTER — Ambulatory Visit: Payer: Medicare Other | Admitting: Cardiology

## 2010-11-15 ENCOUNTER — Encounter: Payer: Self-pay | Admitting: Cardiology

## 2010-11-20 ENCOUNTER — Ambulatory Visit (INDEPENDENT_AMBULATORY_CARE_PROVIDER_SITE_OTHER): Payer: Medicare Other | Admitting: Cardiology

## 2010-11-20 ENCOUNTER — Encounter: Payer: Self-pay | Admitting: Cardiology

## 2010-11-20 VITALS — BP 126/60 | HR 74 | Ht 60.0 in | Wt 168.0 lb

## 2010-11-20 DIAGNOSIS — I6529 Occlusion and stenosis of unspecified carotid artery: Secondary | ICD-10-CM

## 2010-11-20 DIAGNOSIS — F17200 Nicotine dependence, unspecified, uncomplicated: Secondary | ICD-10-CM

## 2010-11-20 DIAGNOSIS — E785 Hyperlipidemia, unspecified: Secondary | ICD-10-CM

## 2010-11-20 DIAGNOSIS — I2510 Atherosclerotic heart disease of native coronary artery without angina pectoris: Secondary | ICD-10-CM

## 2010-11-20 NOTE — Assessment & Plan Note (Signed)
Advised to quit altogether particularly in light of her vascular disease but especially her lower extremities. Discussed this at length.

## 2010-11-20 NOTE — Progress Notes (Signed)
HPI [name] comes in today for followup and management of her history of coronary disease, carotid artery disease, other risk factors which are followed by her primary care, and tobacco use.  She denies any angina or exertional ischemic symptoms. She is limited because of left arthritic knee issues. She denies any symptoms of TIAs or mini strokes. Carotid Dopplers were stable in April of this year. He is on good preventative therapy. She denies any claudication. Again her mobility is limited. He continues to smoke but has cut way back. She has an electric cigarette.   Past Medical History  Diagnosis Date  . Coronary atherosclerosis of native coronary artery   . Occlusion and stenosis of carotid artery without mention of cerebral infarction   . Other and unspecified hyperlipidemia   . CAD (coronary artery disease)   . Hx of hysterectomy     partial    Past Surgical History  Procedure Date  . Appendectomy   . Cholecystectomy   . Colon resection     No family history on file.  History   Social History  . Marital Status: Married    Spouse Name: N/A    Number of Children: N/A  . Years of Education: N/A   Occupational History  . full time    Social History Main Topics  . Smoking status: Current Some Day Smoker  . Smokeless tobacco: Never Used  . Alcohol Use: Yes  . Drug Use: No  . Sexually Active: Not on file   Other Topics Concern  . Not on file   Social History Narrative  . No narrative on file    Allergies  Allergen Reactions  . Pregabalin   . Sulfonamide Derivatives     Current Outpatient Prescriptions  Medication Sig Dispense Refill  . albuterol-ipratropium (COMBIVENT) 18-103 MCG/ACT inhaler Inhale 2 puffs into the lungs as needed.        Marland Kitchen aspirin 81 MG tablet Take 81 mg by mouth 2 (two) times daily.       Marland Kitchen atorvastatin (LIPITOR) 40 MG tablet Take 1 tablet by mouth Daily.      Marland Kitchen dicyclomine (BENTYL) 10 MG capsule as directed.      .  diphenhydramine-acetaminophen (TYLENOL PM) 25-500 MG TABS Take 1 tablet by mouth at bedtime as needed.        . fish oil-omega-3 fatty acids 1000 MG capsule Take 1 g by mouth daily.        . folic acid (FOLVITE) 800 MCG tablet Take 400 mcg by mouth daily.        . metoprolol succinate (TOPROL-XL) 25 MG 24 hr tablet TAKE 1 TABLET ONCE DAILY.  30 tablet  9  . omeprazole (PRILOSEC) 20 MG capsule Take 20 mg by mouth 2 (two) times daily.        . Pyridoxine HCl (VITAMIN B-6 PO) Take by mouth daily.        . sertraline (ZOLOFT) 100 MG tablet Take 150 mg by mouth daily.       Marland Kitchen tiotropium (SPIRIVA) 18 MCG inhalation capsule Place 18 mcg into inhaler and inhale as needed.       . nitroGLYCERIN (NITROSTAT) 0.4 MG SL tablet Place 0.4 mg under the tongue every 5 (five) minutes as needed.          ROS Negative other than HPI.   PE General Appearance: well developed, well nourished in no acute distress HEENT: symmetrical face, PERRLA, good dentition  Neck: no JVD, thyromegaly, or  adenopathy, trachea midline Chest: symmetric without deformity Cardiac: PMI non-displaced, RRR, normal S1, S2, no gallop or murmur Lung: clear to ausculation and percussion Vascular: Decreased pulses in the lower extremities one plus over 4+ bilaterally, delayed capillary refill Abdominal: nondistended, nontender, good bowel sounds, no HSM, no bruits Extremities: no cyanosis, clubbing or edema, no sign of DVT, no varicosities  Skin: normal color, no rashes Neuro: alert and oriented x 3, non-focal Pysch: normal affect Filed Vitals:   11/20/10 1451  BP: 126/60  Pulse: 74  Height: 5' (1.524 m)  Weight: 168 lb (76.204 kg)    EKG Normal sinus rhythm, normal EKG Labs and Studies Reviewed.   Lab Results  Component Value Date   WBC 12.5* 05/05/2008   HGB 12.4 05/05/2008   HCT 35.6* 05/05/2008   MCV 88.9 05/05/2008   PLT 217 05/05/2008      Chemistry      Component Value Date/Time   NA 142 05/04/2008 0611   K 4.0  05/04/2008 0611   CL 109 05/04/2008 0611   CO2 24 05/04/2008 0611   BUN 15 05/04/2008 0611   CREATININE 0.70 05/04/2008 0611      Component Value Date/Time   CALCIUM 8.9 05/04/2008 0611       Lab Results  Component Value Date   CHOL  Value: 137        ATP III CLASSIFICATION:  <200     mg/dL   Desirable  161-096  mg/dL   Borderline High  >=045    mg/dL   High        04/28/8117   Lab Results  Component Value Date   HDL 43 05/04/2008   Lab Results  Component Value Date   LDLCALC  Value: 84        Total Cholesterol/HDL:CHD Risk Coronary Heart Disease Risk Table                     Men   Women  1/2 Average Risk   3.4   3.3  Average Risk       5.0   4.4  2 X Average Risk   9.6   7.1  3 X Average Risk  23.4   11.0        Use the calculated Patient Ratio above and the CHD Risk Table to determine the patient's CHD Risk.        ATP III CLASSIFICATION (LDL):  <100     mg/dL   Optimal  147-829  mg/dL   Near or Above                    Optimal  130-159  mg/dL   Borderline  562-130  mg/dL   High  >865     mg/dL   Very High 7/84/6962   Lab Results  Component Value Date   TRIG 48 05/04/2008   Lab Results  Component Value Date   CHOLHDL 3.2 05/04/2008   No results found for this basename: HGBA1C   No results found for this basename: ALT, AST, GGT, ALKPHOS, BILITOT

## 2010-11-20 NOTE — Assessment & Plan Note (Addendum)
Repeat carotid Dopplers In April of 2013.

## 2010-11-20 NOTE — Assessment & Plan Note (Signed)
Subclinical. Encouraged to stop smoking altogether and continue her medications.

## 2010-11-20 NOTE — Patient Instructions (Signed)
Your physician discussed the hazards of tobacco use. Tobacco use cessation is recommended and techniques and options to help you quit were discussed.  Good work!  Your physician has requested that you have a carotid duplex. This test is an ultrasound of the carotid arteries in your neck. It looks at blood flow through these arteries that supply the brain with blood. Allow one hour for this exam. There are no restrictions or special instructions.  April 2013   Your physician wants you to follow-up in: 1 year with Dr. Daleen Squibb. You will receive a reminder letter in the mail two months in advance. If you don't receive a letter, please call our office to schedule the follow-up appointment.

## 2011-05-16 ENCOUNTER — Other Ambulatory Visit: Payer: Self-pay | Admitting: *Deleted

## 2011-05-16 DIAGNOSIS — I6529 Occlusion and stenosis of unspecified carotid artery: Secondary | ICD-10-CM

## 2011-05-20 ENCOUNTER — Encounter (INDEPENDENT_AMBULATORY_CARE_PROVIDER_SITE_OTHER): Payer: Medicare Other

## 2011-05-20 DIAGNOSIS — I6529 Occlusion and stenosis of unspecified carotid artery: Secondary | ICD-10-CM

## 2011-06-13 ENCOUNTER — Ambulatory Visit: Payer: Medicare Other | Admitting: Cardiology

## 2011-11-18 ENCOUNTER — Encounter: Payer: Self-pay | Admitting: Cardiology

## 2011-12-04 ENCOUNTER — Ambulatory Visit: Payer: Medicare Other | Admitting: Cardiology

## 2011-12-05 ENCOUNTER — Encounter: Payer: Self-pay | Admitting: Cardiology

## 2011-12-05 ENCOUNTER — Ambulatory Visit (INDEPENDENT_AMBULATORY_CARE_PROVIDER_SITE_OTHER): Payer: Medicare Other | Admitting: Cardiology

## 2011-12-05 VITALS — BP 158/76 | HR 88 | Ht 60.0 in | Wt 160.0 lb

## 2011-12-05 DIAGNOSIS — I2510 Atherosclerotic heart disease of native coronary artery without angina pectoris: Secondary | ICD-10-CM

## 2011-12-05 DIAGNOSIS — E785 Hyperlipidemia, unspecified: Secondary | ICD-10-CM

## 2011-12-05 DIAGNOSIS — I6529 Occlusion and stenosis of unspecified carotid artery: Secondary | ICD-10-CM

## 2011-12-05 NOTE — Assessment & Plan Note (Signed)
Stable. Continue secondary preventative therapy. Patient encouraged to stop smoking.

## 2011-12-05 NOTE — Assessment & Plan Note (Signed)
Carotid stable in May We'll repeat in May of 2014.

## 2011-12-05 NOTE — Patient Instructions (Addendum)
Your physician recommends that you continue on your current medications as directed. Please refer to the Current Medication list given to you today.  Your physician wants you to follow-up in: 1 year with Dr. Daleen Squibb. You will receive a reminder letter in the mail two months in advance. If you don't receive a letter, please call our office to schedule the follow-up appointment.   Your physician has requested that you have a carotid duplex May 2014 This test is an ultrasound of the carotid arteries in your neck. It looks at blood flow through these arteries that supply the brain with blood. Allow one hour for this exam. There are no restrictions or special instructions.

## 2011-12-05 NOTE — Progress Notes (Signed)
HPI [name] returns today for management of her diffuse vascular disease.  She has a lot of left hip pain being evaluated for orthopedics. She is fairly immobile. She still smoking about a half-pack cigarettes a day. She denies any angina. Occasionally her left arm aches at rest. She has not tried sublingual nitroglycerin.  Carotid Dopplers in May were stable.  Past Medical History  Diagnosis Date  . Coronary atherosclerosis of native coronary artery   . Occlusion and stenosis of carotid artery without mention of cerebral infarction   . Other and unspecified hyperlipidemia   . CAD (coronary artery disease)   . Hx of hysterectomy     partial    Current Outpatient Prescriptions  Medication Sig Dispense Refill  . aspirin 81 MG tablet Take 81 mg by mouth 2 (two) times daily.       Marland Kitchen atorvastatin (LIPITOR) 80 MG tablet Take 80 mg by mouth daily.      Marland Kitchen dicyclomine (BENTYL) 10 MG capsule as directed.      . diphenhydramine-acetaminophen (TYLENOL PM) 25-500 MG TABS Take 1 tablet by mouth at bedtime as needed.        . fish oil-omega-3 fatty acids 1000 MG capsule Take 1 g by mouth daily.        . folic acid (FOLVITE) 800 MCG tablet Take 400 mcg by mouth daily.        . metoprolol succinate (TOPROL-XL) 25 MG 24 hr tablet TAKE 1 TABLET ONCE DAILY.  30 tablet  9  . nitroGLYCERIN (NITROSTAT) 0.4 MG SL tablet Place 0.4 mg under the tongue every 5 (five) minutes as needed.        Marland Kitchen omeprazole (PRILOSEC) 20 MG capsule Take 20 mg by mouth 2 (two) times daily.        . sertraline (ZOLOFT) 100 MG tablet Take 150 mg by mouth daily.         Allergies  Allergen Reactions  . Pregabalin   . Sulfonamide Derivatives     No family history on file.  History   Social History  . Marital Status: Married    Spouse Name: N/A    Number of Children: N/A  . Years of Education: N/A   Occupational History  . full time    Social History Main Topics  . Smoking status: Light Tobacco Smoker  .  Smokeless tobacco: Never Used  . Alcohol Use: Yes  . Drug Use: No  . Sexually Active: Not on file   Other Topics Concern  . Not on file   Social History Narrative  . No narrative on file    ROS ALL NEGATIVE EXCEPT THOSE NOTED IN HPI  PE  General Appearance: well developed, well nourished in no acute distress HEENT: symmetrical face, PERRLA, good dentition  Neck: no JVD, thyromegaly, or adenopathy, trachea midline Chest: symmetric without deformity Cardiac: PMI non-displaced, RRR, normal S1, S2, no gallop or murmur Lung: clear to ausculation and percussion Vascular: Pulses diminished in the lower extremities but present.  Abdominal: nondistended, nontender, good bowel sounds, no HSM, no bruits Extremities: no cyanosis, clubbing or edema, no sign of DVT, no varicosities  Skin: normal color, no rashes Neuro: alert and oriented x 3, non-focal Pysch: Depressed and exhausted from chronic pain  EKG Normal sinus rhythm, normal EKG  BMET    Component Value Date/Time   NA 142 05/04/2008 0611   K 4.0 05/04/2008 0611   CL 109 05/04/2008 0611   CO2 24 05/04/2008 6295  GLUCOSE 163* 05/04/2008 0611   BUN 15 05/04/2008 0611   CREATININE 0.70 05/04/2008 0611   CALCIUM 8.9 05/04/2008 0611   GFRNONAA >60 05/04/2008 0611   GFRAA  Value: >60        The eGFR has been calculated using the MDRD equation. This calculation has not been validated in all clinical situations. eGFR's persistently <60 mL/min signify possible Chronic Kidney Disease. 05/04/2008 0611    Lipid Panel     Component Value Date/Time   CHOL  Value: 137        ATP III CLASSIFICATION:  <200     mg/dL   Desirable  119-147  mg/dL   Borderline High  >=829    mg/dL   High        5/62/1308 0611   TRIG 48 05/04/2008 0611   HDL 43 05/04/2008 0611   CHOLHDL 3.2 05/04/2008 0611   VLDL 10 05/04/2008 0611   LDLCALC  Value: 84        Total Cholesterol/HDL:CHD Risk Coronary Heart Disease Risk Table                     Men   Women  1/2 Average  Risk   3.4   3.3  Average Risk       5.0   4.4  2 X Average Risk   9.6   7.1  3 X Average Risk  23.4   11.0        Use the calculated Patient Ratio above and the CHD Risk Table to determine the patient's CHD Risk.        ATP III CLASSIFICATION (LDL):  <100     mg/dL   Optimal  657-846  mg/dL   Near or Above                    Optimal  130-159  mg/dL   Borderline  962-952  mg/dL   High  >841     mg/dL   Very High 04/12/4008 2725    CBC    Component Value Date/Time   WBC 12.5* 05/05/2008 0515   RBC 4.00 05/05/2008 0515   HGB 12.4 05/05/2008 0515   HCT 35.6* 05/05/2008 0515   PLT 217 05/05/2008 0515   MCV 88.9 05/05/2008 0515   MCHC 34.9 05/05/2008 0515   RDW 13.2 05/05/2008 0515

## 2011-12-31 ENCOUNTER — Other Ambulatory Visit (HOSPITAL_COMMUNITY): Payer: Self-pay | Admitting: Obstetrics

## 2011-12-31 DIAGNOSIS — N838 Other noninflammatory disorders of ovary, fallopian tube and broad ligament: Secondary | ICD-10-CM

## 2012-01-07 ENCOUNTER — Ambulatory Visit (HOSPITAL_COMMUNITY)
Admission: RE | Admit: 2012-01-07 | Discharge: 2012-01-07 | Disposition: A | Discharge status: Home / Self Care | Payer: Medicare Other | Source: Ambulatory Visit | Attending: Obstetrics | Admitting: Obstetrics

## 2012-01-07 DIAGNOSIS — N949 Unspecified condition associated with female genital organs and menstrual cycle: Secondary | ICD-10-CM | POA: Insufficient documentation

## 2012-01-07 DIAGNOSIS — Z90710 Acquired absence of both cervix and uterus: Secondary | ICD-10-CM | POA: Insufficient documentation

## 2012-01-07 DIAGNOSIS — N838 Other noninflammatory disorders of ovary, fallopian tube and broad ligament: Secondary | ICD-10-CM

## 2012-01-07 DIAGNOSIS — N9489 Other specified conditions associated with female genital organs and menstrual cycle: Secondary | ICD-10-CM | POA: Insufficient documentation

## 2012-01-07 DIAGNOSIS — N83209 Unspecified ovarian cyst, unspecified side: Secondary | ICD-10-CM | POA: Insufficient documentation

## 2012-01-19 ENCOUNTER — Other Ambulatory Visit (HOSPITAL_COMMUNITY): Payer: Self-pay | Admitting: Obstetrics

## 2012-01-19 DIAGNOSIS — N83209 Unspecified ovarian cyst, unspecified side: Secondary | ICD-10-CM

## 2012-03-09 ENCOUNTER — Ambulatory Visit (HOSPITAL_COMMUNITY): Payer: Medicare HMO

## 2012-05-04 ENCOUNTER — Other Ambulatory Visit: Payer: Self-pay | Admitting: Cardiology

## 2012-06-03 ENCOUNTER — Encounter (INDEPENDENT_AMBULATORY_CARE_PROVIDER_SITE_OTHER): Payer: Medicare HMO

## 2012-06-03 DIAGNOSIS — I6529 Occlusion and stenosis of unspecified carotid artery: Secondary | ICD-10-CM

## 2012-08-30 ENCOUNTER — Telehealth: Payer: Self-pay | Admitting: Cardiology

## 2012-08-30 NOTE — Telephone Encounter (Signed)
New problem   Pt needs to know who dr wall will be referring her to for cardiology

## 2012-09-03 NOTE — Telephone Encounter (Signed)
Referred to Dr. Delton See. Please give her my best regards.

## 2012-09-10 NOTE — Telephone Encounter (Signed)
Pt aware letter will be mailed to her about making an appointment with Dr. Delton See Recall placed for Dr. Henderson Newcomer RN

## 2012-09-13 ENCOUNTER — Telehealth: Payer: Self-pay | Admitting: Cardiology

## 2012-09-13 NOTE — Telephone Encounter (Signed)
Pt was scheduled an appointment  On 12/06/12 with Dr. Patty Sermons. Pt would like have the appointment made with Dr. Delton See because that is the MD Dr. Daleen Squibb has referred her to see as her Cardiologist. Glynda Jaeger scheduler aware, to call pt for an appointment when Dr. Delton See 's schedule is out. Pt is  Aware.

## 2012-09-13 NOTE — Telephone Encounter (Signed)
New Problem  Pt states she received a call Friday was in a lot of pain was unable to call // not sure of the nature of call// request a call back

## 2012-12-06 ENCOUNTER — Ambulatory Visit: Payer: Medicare HMO | Admitting: Cardiology

## 2012-12-13 ENCOUNTER — Encounter: Payer: Self-pay | Admitting: Cardiology

## 2012-12-13 ENCOUNTER — Other Ambulatory Visit (INDEPENDENT_AMBULATORY_CARE_PROVIDER_SITE_OTHER): Payer: Medicare HMO

## 2012-12-13 ENCOUNTER — Ambulatory Visit (INDEPENDENT_AMBULATORY_CARE_PROVIDER_SITE_OTHER)
Admission: RE | Admit: 2012-12-13 | Discharge: 2012-12-13 | Disposition: A | Discharge status: Home / Self Care | Payer: Medicare HMO | Source: Ambulatory Visit | Attending: Cardiology | Admitting: Cardiology

## 2012-12-13 ENCOUNTER — Ambulatory Visit (INDEPENDENT_AMBULATORY_CARE_PROVIDER_SITE_OTHER): Payer: Medicare HMO | Admitting: Cardiology

## 2012-12-13 VITALS — BP 132/70 | HR 72 | Ht 60.0 in | Wt 158.0 lb

## 2012-12-13 DIAGNOSIS — R079 Chest pain, unspecified: Secondary | ICD-10-CM

## 2012-12-13 DIAGNOSIS — Z7901 Long term (current) use of anticoagulants: Secondary | ICD-10-CM

## 2012-12-13 LAB — CBC WITH DIFFERENTIAL/PLATELET
Basophils Absolute: 0 10*3/uL (ref 0.0–0.1)
Basophils Relative: 0.5 % (ref 0.0–3.0)
Eosinophils Absolute: 0.3 10*3/uL (ref 0.0–0.7)
Eosinophils Relative: 3.5 % (ref 0.0–5.0)
HCT: 38.7 % (ref 36.0–46.0)
Hemoglobin: 12.9 g/dL (ref 12.0–15.0)
Lymphocytes Relative: 30.7 % (ref 12.0–46.0)
Lymphs Abs: 2.5 10*3/uL (ref 0.7–4.0)
MCHC: 33.5 g/dL (ref 30.0–36.0)
MCV: 88.9 fl (ref 78.0–100.0)
Monocytes Absolute: 0.5 10*3/uL (ref 0.1–1.0)
Monocytes Relative: 5.8 % (ref 3.0–12.0)
Neutro Abs: 4.9 10*3/uL (ref 1.4–7.7)
Neutrophils Relative %: 59.5 % (ref 43.0–77.0)
Platelets: 231 10*3/uL (ref 150.0–400.0)
RBC: 4.35 Mil/uL (ref 3.87–5.11)
RDW: 13.6 % (ref 11.5–14.6)
WBC: 8.2 10*3/uL (ref 4.5–10.5)

## 2012-12-13 LAB — BASIC METABOLIC PANEL
BUN: 18 mg/dL (ref 6–23)
CO2: 25 mEq/L (ref 19–32)
Calcium: 9.2 mg/dL (ref 8.4–10.5)
Chloride: 105 mEq/L (ref 96–112)
Creatinine, Ser: 0.6 mg/dL (ref 0.4–1.2)
GFR: 100.05 mL/min (ref >60.00)
Glucose, Bld: 85 mg/dL (ref 70–99)
Potassium: 4.1 mEq/L (ref 3.5–5.1)
Sodium: 137 mEq/L (ref 135–145)

## 2012-12-13 LAB — PROTIME-INR
INR: 1 ratio (ref 0.8–1.0)
Prothrombin Time: 10.7 s (ref 10.2–12.4)

## 2012-12-13 MED ORDER — METOPROLOL SUCCINATE ER 25 MG PO TB24: 25.0000 mg | ORAL_TABLET | Freq: Every day | ORAL | Status: DC

## 2012-12-13 MED ORDER — NITROGLYCERIN 0.4 MG SL SUBL: 0.4000 mg | SUBLINGUAL_TABLET | SUBLINGUAL | Status: DC | PRN

## 2012-12-13 NOTE — Progress Notes (Signed)
Patient ID: [name], female   DOB: 05/30/1940, 72 y.o.   MRN: [id number]     Patient Name: [name] Date of Encounter: 12/13/2012  Primary Care Provider:  MOON,AMY, NP Primary Cardiologist:  Salvador Bigbee, H  Problem List   Past Medical History  Diagnosis Date  . Coronary atherosclerosis of native coronary artery   . Occlusion and stenosis of carotid artery without mention of cerebral infarction   . Other and unspecified hyperlipidemia   . CAD (coronary artery disease)   . Hx of hysterectomy     partial   Past Surgical History  Procedure Laterality Date  . Appendectomy    . Cholecystectomy    . Colon resection      Allergies  Allergies  Allergen Reactions  . Pregabalin   . Sulfonamide Derivatives    HPI  [name] was previously followed by Dr Wall. She is a pleasant 72 year old female with h/o hyperlipidemia, hypertension, PAD and CAD who returns today for a 1 year follow up. The patient is a lifelong smoker.  She report new onset of exertional retrosternal chest tightness that started couple months ago and currently appears to almost any physical activity especially with household chores. The patient states that when she takes a break the pain goes away in about 30 minutes. These pain brought her to the [place] ER and acute coronary syndrome was ruled out. The patient denies shortness of breath or dizziness associated with her chest tightness. The chest tightness is retrosternal pressure-like, without any radiation.  She also admits to occasional palpitations, especially when she lays down in bed. No associated symptoms at this.  The patient denies any symptoms of dizziness or claudication.  Home Medications  Prior to Admission medications   Medication Sig Start Date End Date Taking? Authorizing Provider  aspirin 81 MG tablet Take 81 mg by mouth 2 (two) times daily.    Yes Historical Provider, MD  atorvastatin (LIPITOR) 80 MG tablet  Take 80 mg by mouth daily.   Yes Historical Provider, MD  dicyclomine (BENTYL) 10 MG capsule as directed. 08/20/10  Yes Historical Provider, MD  diphenhydramine-acetaminophen (TYLENOL PM) 25-500 MG TABS Take 1 tablet by mouth at bedtime as needed.     Yes Historical Provider, MD  fish oil-omega-3 fatty acids 1000 MG capsule Take 1 g by mouth daily.     Yes Historical Provider, MD  folic acid (FOLVITE) 800 MCG tablet Take 400 mcg by mouth daily.     Yes Historical Provider, MD  metoprolol succinate (TOPROL-XL) 25 MG 24 hr tablet TAKE 1 TABLET ONCE DAILY. 05/04/12  Yes Thomas C Wall, MD  nitroGLYCERIN (NITROSTAT) 0.4 MG SL tablet Place 0.4 mg under the tongue every 5 (five) minutes as needed.     Yes Historical Provider, MD  omeprazole (PRILOSEC) 20 MG capsule Take 20 mg by mouth 2 (two) times daily.     Yes Historical Provider, MD  sertraline (ZOLOFT) 100 MG tablet Take 150 mg by mouth daily.    Yes Historical Provider, MD    Family History  No family history on file.  Social History  History   Social History  . Marital Status: Married    Spouse Name: N/A    Number of Children: N/A  . Years of Education: N/A   Occupational History  . full time    Social History Main Topics  . Smoking status: Light Tobacco Smoker  . Smokeless tobacco: Never Used  . Alcohol   Use: Yes  . Drug Use: No  . Sexual Activity: Not on file   Other Topics Concern  . Not on file   Social History Narrative  . No narrative on file     Review of Systems, as per HPI, otherwise negative General:  No chills, fever, night sweats or weight changes.  Cardiovascular:  No chest pain, dyspnea on exertion, edema, orthopnea, palpitations, paroxysmal nocturnal dyspnea. Dermatological: No rash, lesions/masses Respiratory: No cough, dyspnea Urologic: No hematuria, dysuria Abdominal:   No nausea, vomiting, diarrhea, bright red blood per rectum, melena, or hematemesis Neurologic:  No visual changes, wkns, changes in  mental status. All other systems reviewed and are otherwise negative except as noted above.  Physical Exam  Blood pressure 132/70, pulse 72, height 5' (1.524 m), weight 158 lb (71.668 kg).  General: Pleasant, NAD Psych: Normal affect. Neuro: Alert and oriented X 3. Moves all extremities spontaneously. HEENT: Normal  Neck: Supple without bruits or JVD. Lungs:  Resp regular and unlabored, CTA. Heart: RRR no s3, s4, or murmurs. Abdomen: Soft, non-tender, non-distended, BS + x 4.  Extremities: No clubbing, cyanosis or edema. DP/PT/Radials 2+ and equal bilaterally.  Labs:  No results found for this basename: CKTOTAL, CKMB, TROPONINI,  in the last 72 hours Lab Results  Component Value Date   WBC 12.5* 05/05/2008   HGB 12.4 05/05/2008   HCT 35.6* 05/05/2008   MCV 88.9 05/05/2008   PLT 217 05/05/2008   No results found for this basename: NA, K, CL, CO2, BUN, CREATININE, CALCIUM, LABALBU, PROT, BILITOT, ALKPHOS, ALT, AST, GLUCOSE,  in the last 168 hours Lab Results  Component Value Date   CHOL  Value: 137        ATP III CLASSIFICATION:  <200     mg/dL   Desirable  200-239  mg/dL   Borderline High  >=240    mg/dL   High        05/04/2008   HDL 43 05/04/2008   LDLCALC  Value: 84        Total Cholesterol/HDL:CHD Risk Coronary Heart Disease Risk Table                     Men   Women  1/2 Average Risk   3.4   3.3  Average Risk       5.0   4.4  2 X Average Risk   9.6   7.1  3 X Average Risk  23.4   11.0        Use the calculated Patient Ratio above and the CHD Risk Table to determine the patient's CHD Risk.        ATP III CLASSIFICATION (LDL):  <100     mg/dL   Optimal  100-129  mg/dL   Near or Above                    Optimal  130-159  mg/dL   Borderline  160-189  mg/dL   High  >190     mg/dL   Very High 05/04/2008   TRIG 48 05/04/2008   No results found for this basename: DDIMER   No components found with this basename: POCBNP,   Accessory Clinical Findings  Echocardiogram none in Epic  ECG - SR,  normal ECG   Assessment & Plan   72 year old female with multiple risk factors for CAD including lifelong smoking, known B/L carotid disease, HTN and hyperlipidemia  1. Typical exertional chest   pain - relieved by rest in 30 minutes, recent visit to the ER, we will draw labs today and schedule an outpatient cath for the next Monday December 1. Prescription for sl NTG given.  2. Hypertension - controlled  3. Hyperlipidemia - managed by PCP, on high dose of atorvastatin 80 mg PO QHS  4. PAD - B/L 60-79% carotid disease, we will repeat Duplex carotids in May 2015   Follow up after the cath  Aalyssa Elderkin, H, MD, FACC 12/13/2012, 9:22 AM     

## 2012-12-13 NOTE — Patient Instructions (Addendum)
**Note De-identified  Obfuscation** Your physician has requested that you have a cardiac catheterization. Cardiac catheterization is used to diagnose and/or treat various heart conditions. Doctors may recommend this procedure for a number of different reasons. The most common reason is to evaluate chest pain. Chest pain can be a symptom of coronary artery disease (CAD), and cardiac catheterization can show whether plaque is narrowing or blocking your heart's arteries. This procedure is also used to evaluate the valves, as well as measure the blood flow and oxygen levels in different parts of your heart. For further information please visit www.cardiosmart.org. Please follow instruction sheet, as given.  Your physician recommends that you return for lab work in: today  A chest x-ray takes a picture of the organs and structures inside the chest, including the heart, lungs, and blood vessels. This test can show several things, including, whether the heart is enlarges; whether fluid is building up in the lungs; and whether pacemaker / defibrillator leads are still in place.  Your physician recommends that you schedule a follow-up appointment in: after cath   

## 2012-12-14 DIAGNOSIS — R079 Chest pain, unspecified: Secondary | ICD-10-CM | POA: Insufficient documentation

## 2012-12-15 ENCOUNTER — Encounter (HOSPITAL_COMMUNITY): Payer: Self-pay

## 2012-12-20 ENCOUNTER — Encounter (HOSPITAL_COMMUNITY)
Admission: RE | Disposition: A | Discharge status: Home / Self Care | Payer: Self-pay | Source: Ambulatory Visit | Attending: Cardiovascular Disease

## 2012-12-20 ENCOUNTER — Ambulatory Visit (HOSPITAL_COMMUNITY)
Admission: RE | Admit: 2012-12-20 | Discharge: 2012-12-21 | Disposition: A | Discharge status: Home / Self Care | Payer: Medicare HMO | Source: Ambulatory Visit | Attending: Cardiovascular Disease | Admitting: Cardiovascular Disease

## 2012-12-20 ENCOUNTER — Encounter (HOSPITAL_COMMUNITY): Payer: Self-pay | Admitting: General Practice

## 2012-12-20 DIAGNOSIS — R079 Chest pain, unspecified: Secondary | ICD-10-CM

## 2012-12-20 DIAGNOSIS — E785 Hyperlipidemia, unspecified: Secondary | ICD-10-CM

## 2012-12-20 DIAGNOSIS — I2510 Atherosclerotic heart disease of native coronary artery without angina pectoris: Secondary | ICD-10-CM

## 2012-12-20 DIAGNOSIS — I10 Essential (primary) hypertension: Secondary | ICD-10-CM | POA: Insufficient documentation

## 2012-12-20 DIAGNOSIS — Z955 Presence of coronary angioplasty implant and graft: Secondary | ICD-10-CM

## 2012-12-20 DIAGNOSIS — I658 Occlusion and stenosis of other precerebral arteries: Secondary | ICD-10-CM | POA: Insufficient documentation

## 2012-12-20 DIAGNOSIS — F17200 Nicotine dependence, unspecified, uncomplicated: Secondary | ICD-10-CM | POA: Insufficient documentation

## 2012-12-20 DIAGNOSIS — E782 Mixed hyperlipidemia: Secondary | ICD-10-CM | POA: Diagnosis present

## 2012-12-20 DIAGNOSIS — I209 Angina pectoris, unspecified: Secondary | ICD-10-CM | POA: Insufficient documentation

## 2012-12-20 DIAGNOSIS — Z23 Encounter for immunization: Secondary | ICD-10-CM | POA: Insufficient documentation

## 2012-12-20 HISTORY — PX: CORONARY ANGIOPLASTY WITH STENT PLACEMENT: SHX49

## 2012-12-20 HISTORY — DX: Cardiac murmur, unspecified: R01.1

## 2012-12-20 HISTORY — PX: LEFT HEART CATHETERIZATION WITH CORONARY ANGIOGRAM: SHX5451

## 2012-12-20 HISTORY — DX: Peripheral vascular disease, unspecified: I73.9

## 2012-12-20 HISTORY — DX: Essential (primary) hypertension: I10

## 2012-12-20 HISTORY — DX: Barrett's esophagus without dysplasia: K22.70

## 2012-12-20 HISTORY — DX: Major depressive disorder, single episode, unspecified: F32.9

## 2012-12-20 HISTORY — DX: Depression, unspecified: F32.A

## 2012-12-20 HISTORY — DX: Disorder of arteries and arterioles, unspecified: I77.9

## 2012-12-20 HISTORY — DX: Unspecified osteoarthritis, unspecified site: M19.90

## 2012-12-20 LAB — POCT ACTIVATED CLOTTING TIME: Activated Clotting Time: 237 seconds

## 2012-12-20 SURGERY — LEFT HEART CATHETERIZATION WITH CORONARY ANGIOGRAM
Anesthesia: LOCAL

## 2012-12-20 MED ORDER — HYDROCODONE-ACETAMINOPHEN 5-325 MG PO TABS: 1.0000 | ORAL_TABLET | Freq: Four times a day (QID) | ORAL | Status: DC

## 2012-12-20 MED ORDER — LIDOCAINE HCL (PF) 1 % IJ SOLN
INTRAMUSCULAR | Status: AC
  Filled 2012-12-20: qty 30

## 2012-12-20 MED ORDER — HYDROCODONE-ACETAMINOPHEN 5-325 MG PO TABS
1.0000 | ORAL_TABLET | Freq: Four times a day (QID) | ORAL | Status: DC | PRN
  Administered 2012-12-20: 1 via ORAL
  Filled 2012-12-20: qty 1

## 2012-12-20 MED ORDER — DIAZEPAM 5 MG PO TABS: 5.0000 mg | ORAL_TABLET | Freq: Three times a day (TID) | ORAL | Status: DC | PRN

## 2012-12-20 MED ORDER — MIDAZOLAM HCL 2 MG/2ML IJ SOLN
INTRAMUSCULAR | Status: AC
  Filled 2012-12-20: qty 2

## 2012-12-20 MED ORDER — METOPROLOL SUCCINATE ER 25 MG PO TB24
25.0000 mg | ORAL_TABLET | Freq: Every day | ORAL | Status: DC
  Administered 2012-12-21: 25 mg via ORAL
  Filled 2012-12-20 (×2): qty 1

## 2012-12-20 MED ORDER — SODIUM CHLORIDE 0.9 % IV SOLN: 250.0000 mL | INTRAVENOUS | Status: DC | PRN

## 2012-12-20 MED ORDER — CLOPIDOGREL BISULFATE 300 MG PO TABS
ORAL_TABLET | ORAL | Status: AC
  Filled 2012-12-20: qty 2

## 2012-12-20 MED ORDER — SODIUM CHLORIDE 0.9 % IV SOLN
INTRAVENOUS | Status: DC
  Administered 2012-12-20: 08:00:00 via INTRAVENOUS

## 2012-12-20 MED ORDER — PNEUMOCOCCAL VAC POLYVALENT 25 MCG/0.5ML IJ INJ
0.5000 mL | INJECTION | INTRAMUSCULAR | Status: AC
Start: 2012-12-20 — End: 2012-12-21
  Administered 2012-12-21: 12:00:00 0.5 mL via INTRAMUSCULAR
  Filled 2012-12-20: qty 0.5

## 2012-12-20 MED ORDER — ASPIRIN EC 81 MG PO TBEC
162.0000 mg | DELAYED_RELEASE_TABLET | Freq: Every day | ORAL | Status: DC
  Administered 2012-12-21: 10:00:00 162 mg via ORAL
  Filled 2012-12-20: qty 2

## 2012-12-20 MED ORDER — SODIUM CHLORIDE 0.9 % IJ SOLN: 3.0000 mL | INTRAMUSCULAR | Status: DC | PRN

## 2012-12-20 MED ORDER — ACETAMINOPHEN 500 MG PO TABS
1000.0000 mg | ORAL_TABLET | Freq: Every day | ORAL | Status: DC
Start: 2012-12-20 — End: 2012-12-21
  Administered 2012-12-20: 22:00:00 1000 mg via ORAL
  Filled 2012-12-20 (×2): qty 2

## 2012-12-20 MED ORDER — FENTANYL CITRATE 0.05 MG/ML IJ SOLN
INTRAMUSCULAR | Status: AC
  Filled 2012-12-20: qty 2

## 2012-12-20 MED ORDER — ONDANSETRON HCL 4 MG/2ML IJ SOLN: 4.0000 mg | Freq: Four times a day (QID) | INTRAMUSCULAR | Status: DC | PRN

## 2012-12-20 MED ORDER — FAMOTIDINE IN NACL 20-0.9 MG/50ML-% IV SOLN
INTRAVENOUS | Status: AC
  Filled 2012-12-20: qty 50

## 2012-12-20 MED ORDER — HEPARIN SODIUM (PORCINE) 1000 UNIT/ML IJ SOLN
INTRAMUSCULAR | Status: AC
  Filled 2012-12-20: qty 1

## 2012-12-20 MED ORDER — ALUM & MAG HYDROXIDE-SIMETH 200-200-20 MG/5ML PO SUSP
ORAL | Status: AC
  Filled 2012-12-20: qty 30

## 2012-12-20 MED ORDER — DIPHENHYDRAMINE-APAP (SLEEP) 25-500 MG PO TABS: 2.0000 | ORAL_TABLET | Freq: Every day | ORAL | Status: DC

## 2012-12-20 MED ORDER — SERTRALINE HCL 100 MG PO TABS
200.0000 mg | ORAL_TABLET | Freq: Every day | ORAL | Status: DC
  Administered 2012-12-21: 10:00:00 200 mg via ORAL
  Filled 2012-12-20: qty 2

## 2012-12-20 MED ORDER — SODIUM CHLORIDE 0.9 % IV SOLN: 1.0000 mL/kg/h | INTRAVENOUS | Status: AC

## 2012-12-20 MED ORDER — DIPHENHYDRAMINE HCL 50 MG PO CAPS
50.0000 mg | ORAL_CAPSULE | Freq: Every day | ORAL | Status: DC
  Administered 2012-12-20: 50 mg via ORAL
  Filled 2012-12-20 (×2): qty 1
  Filled 2012-12-20: qty 2

## 2012-12-20 MED ORDER — SODIUM CHLORIDE 0.9 % IJ SOLN: 3.0000 mL | Freq: Two times a day (BID) | INTRAMUSCULAR | Status: DC

## 2012-12-20 MED ORDER — DICYCLOMINE HCL 10 MG PO CAPS
10.0000 mg | ORAL_CAPSULE | Freq: Every day | ORAL | Status: DC | PRN
  Filled 2012-12-20: qty 1

## 2012-12-20 MED ORDER — NITROGLYCERIN 0.4 MG SL SUBL: 0.4000 mg | SUBLINGUAL_TABLET | SUBLINGUAL | Status: DC | PRN

## 2012-12-20 MED ORDER — ACETAMINOPHEN 325 MG PO TABS: 650.0000 mg | ORAL_TABLET | ORAL | Status: DC | PRN

## 2012-12-20 MED ORDER — ATORVASTATIN CALCIUM 80 MG PO TABS
80.0000 mg | ORAL_TABLET | Freq: Every evening | ORAL | Status: DC
  Administered 2012-12-20: 18:00:00 80 mg via ORAL
  Filled 2012-12-20 (×2): qty 1

## 2012-12-20 MED ORDER — ASPIRIN 81 MG PO CHEW: 81.0000 mg | CHEWABLE_TABLET | ORAL | Status: DC

## 2012-12-20 MED ORDER — VERAPAMIL HCL 2.5 MG/ML IV SOLN
INTRAVENOUS | Status: AC
  Filled 2012-12-20: qty 2

## 2012-12-20 MED ORDER — NITROGLYCERIN 0.2 MG/ML ON CALL CATH LAB
INTRAVENOUS | Status: AC
  Filled 2012-12-20: qty 1

## 2012-12-20 MED ORDER — HEPARIN (PORCINE) IN NACL 2-0.9 UNIT/ML-% IJ SOLN
INTRAMUSCULAR | Status: AC
  Filled 2012-12-20: qty 1500

## 2012-12-20 MED ORDER — FOLIC ACID 1 MG PO TABS
1.0000 mg | ORAL_TABLET | Freq: Every day | ORAL | Status: DC
  Administered 2012-12-21: 10:00:00 1 mg via ORAL
  Filled 2012-12-20: qty 1

## 2012-12-20 MED ORDER — FOLIC ACID 800 MCG PO TABS: 800.0000 ug | ORAL_TABLET | Freq: Every day | ORAL | Status: DC

## 2012-12-20 MED ORDER — CLOPIDOGREL BISULFATE 75 MG PO TABS
75.0000 mg | ORAL_TABLET | Freq: Every day | ORAL | Status: DC
  Administered 2012-12-21: 10:00:00 75 mg via ORAL
  Filled 2012-12-20: qty 1

## 2012-12-20 NOTE — CV Procedure (Signed)
    Cardiac Catheterization Procedure Note  Name: [name] MRN: [id number] DOB: Oct 13, 1940  Procedure: Left Heart Cath, Selective Coronary Angiography, LV angiography, PTCA and stenting of the mid-circumflex, PTCA and stenting of the first OM  Indication: CCS class 3 angina, progressive symtpoms  Procedural Details:  The right wrist was prepped, draped, and anesthetized with 1% lidocaine. Using the modified Seldinger technique, a 5/6 French sheath was introduced into the right radial artery. 3 mg of verapamil was administered through the sheath, weight-based unfractionated heparin was administered intravenously. Standard Judkins catheters were used for selective coronary angiography and left ventriculography. Catheter exchanges were performed over an exchange length guidewire.  PROCEDURAL FINDINGS Hemodynamics: AO 146/59 LV 149/16   Coronary angiography: Coronary dominance: right  Left mainstem: Sort segment arising from left cusp, divides into LAD and LCx without obstructive disease  Left anterior descending (LAD): Moderate calcification. First diag has diffuse 50% stenosis. Mid-LAD has 30-40% stenosis. Otherwise wide patency and there is no high-grade stenosis present.  Left circumflex (LCx): normal caliber vessel. The first OM has 80% stenosis. The mid-AV circumflex has 90% eccentirc stenosis. The vessel supplies 2 small OM branches beyond the severe stenosis in the mid-circumflex.  Right coronary artery (RCA): normal caliber, dominant vessel. The mid-vessel has 50% stenosis. The PDA is patent.   Left ventriculography: Left ventricular systolic function is normal, LVEF is estimated at 55-65%, there is no significant mitral regurgitation   PCI Note:  Following the diagnostic procedure, the decision was made to proceed with PCI. The images were compared to the patient's 2006 study, and the LAD and RCA stenosis is stable. However, the LCx stenosis has advanced significantly.    Weight-based heparin was given for anticoagulation. Plavix 600 mg was administered. Once a therapeutic ACT was achieved, a 6 [place] XB-LAD 3.0 cm guide catheter was inserted.  Attention was first turned to the AV circumflex, as it was the most critical lesion. A cougar coronary guidewire was used to cross the lesion.  The lesion was predilated with a 2.5x12 mm balloon.  The lesion was then stented with a 2.5x16 mm Promus DES stent.  The stent was postdilated with a 2.75 mm noncompliant balloon.  Following PCI, there was 0% residual stenosis and TIMI-3 flow. Attention was then turned to the first OM. The cougar wire was redirected down this vessel. The lesion was primarily stented with a 2.25x12 mm Promus DES and post-dilated to 16 atm with a 2.5 mm [place] balloon. Final angiography confirmed an excellent result. The patient tolerated the procedure well. There were no immediate procedural complications. A TR band was used for radial hemostasis. The patient was transferred to the post catheterization recovery area for further monitoring.  PCI Data: Lesion 1 Vessel - left circumflex/Segment - mid Percent Stenosis (pre)  95 TIMI-flow 3 Stent 2.5x16 mm Promus DES Percent Stenosis (post) 0 TIMI-flow (post) 3  Lesion 2 Vessel - OM1/Segment - mid Percent Stenosis (pre)  80 TIMI-flow 3 Stent 2.25x12 mm Promus DES Percent Stenosis (post) 0 TIMI-flow (post) 3  Final Conclusions:   1. Severe left circumflex and OM disease treated successfully with PCI 2. Moderate LAD and RCA stenosis unchanged from the previous study in 2006.  3. Normal LV function   Recommendations:  DAPT with ASA and Plavix for at least 12 months. Continued aggressive medical therapy.  Tonny Bollman 12/20/2012, 11:56 AM

## 2012-12-20 NOTE — Progress Notes (Signed)
TR BAND REMOVAL  LOCATION:  right radial  DEFLATED PER PROTOCOL:  yes  TIME BAND OFF / DRESSING APPLIED:   1645   SITE UPON ARRIVAL:   Level 0  SITE AFTER BAND REMOVAL:  Level 1  REVERSE ALLEN'S TEST:    positive  CIRCULATION SENSATION AND MOVEMENT:  Within Normal Limits  yes  COMMENTS:  Upon removal of band, hematoma immediately started to form.  Applied pressure for 10 minutes with good result.  Added second pillow for elevation, applied ice.

## 2012-12-20 NOTE — H&P (View-Only) (Signed)
Patient ID: EDA MAGNUSSEN, female   DOB: 06/11/1940, 72 y.o.   MRN: [id number]     Patient Name: [name] Date of Encounter: 12/13/2012  Primary Care Provider:  Gaye Alken, NP Primary Cardiologist:  [name], H  Problem List   Past Medical History  Diagnosis Date  . Coronary atherosclerosis of native coronary artery   . Occlusion and stenosis of carotid artery without mention of cerebral infarction   . Other and unspecified hyperlipidemia   . CAD (coronary artery disease)   . Hx of hysterectomy     partial   Past Surgical History  Procedure Laterality Date  . Appendectomy    . Cholecystectomy    . Colon resection      Allergies  Allergies  Allergen Reactions  . Pregabalin   . Sulfonamide Derivatives    HPI  [name] was previously followed by Dr Daleen Squibb. She is a pleasant 72 year old female with h/o hyperlipidemia, hypertension, PAD and CAD who returns today for a 1 year follow up. The patient is a lifelong smoker.  She report new onset of exertional retrosternal chest tightness that started couple months ago and currently appears to almost any physical activity especially with household chores. The patient states that when she takes a break the pain goes away in about 30 minutes. These pain brought her to the [place] ER and acute coronary syndrome was ruled out. The patient denies shortness of breath or dizziness associated with her chest tightness. The chest tightness is retrosternal pressure-like, without any radiation.  She also admits to occasional palpitations, especially when she lays down in bed. No associated symptoms at this.  The patient denies any symptoms of dizziness or claudication.  Home Medications  Prior to Admission medications   Medication Sig Start Date End Date Taking? Authorizing Provider  aspirin 81 MG tablet Take 81 mg by mouth 2 (two) times daily.    Yes Historical Provider, MD  atorvastatin (LIPITOR) 80 MG tablet  Take 80 mg by mouth daily.   Yes Historical Provider, MD  dicyclomine (BENTYL) 10 MG capsule as directed. 08/20/10  Yes Historical Provider, MD  diphenhydramine-acetaminophen (TYLENOL PM) 25-500 MG TABS Take 1 tablet by mouth at bedtime as needed.     Yes Historical Provider, MD  fish oil-omega-3 fatty acids 1000 MG capsule Take 1 g by mouth daily.     Yes Historical Provider, MD  folic acid (FOLVITE) 800 MCG tablet Take 400 mcg by mouth daily.     Yes Historical Provider, MD  metoprolol succinate (TOPROL-XL) 25 MG 24 hr tablet TAKE 1 TABLET ONCE DAILY. 05/04/12  Yes Gaylord Shih, MD  nitroGLYCERIN (NITROSTAT) 0.4 MG SL tablet Place 0.4 mg under the tongue every 5 (five) minutes as needed.     Yes Historical Provider, MD  omeprazole (PRILOSEC) 20 MG capsule Take 20 mg by mouth 2 (two) times daily.     Yes Historical Provider, MD  sertraline (ZOLOFT) 100 MG tablet Take 150 mg by mouth daily.    Yes Historical Provider, MD    Family History  No family history on file.  Social History  History   Social History  . Marital Status: Married    Spouse Name: N/A    Number of Children: N/A  . Years of Education: N/A   Occupational History  . full time    Social History Main Topics  . Smoking status: Light Tobacco Smoker  . Smokeless tobacco: Never Used  . Alcohol  Use: Yes  . Drug Use: No  . Sexual Activity: Not on file   Other Topics Concern  . Not on file   Social History Narrative  . No narrative on file     Review of Systems, as per HPI, otherwise negative General:  No chills, fever, night sweats or weight changes.  Cardiovascular:  No chest pain, dyspnea on exertion, edema, orthopnea, palpitations, paroxysmal nocturnal dyspnea. Dermatological: No rash, lesions/masses Respiratory: No cough, dyspnea Urologic: No hematuria, dysuria Abdominal:   No nausea, vomiting, diarrhea, bright red blood per rectum, melena, or hematemesis Neurologic:  No visual changes, wkns, changes in  mental status. All other systems reviewed and are otherwise negative except as noted above.  Physical Exam  Blood pressure 132/70, pulse 72, height 5' (1.524 m), weight 158 lb (71.668 kg).  General: Pleasant, NAD Psych: Normal affect. Neuro: Alert and oriented X 3. Moves all extremities spontaneously. HEENT: Normal  Neck: Supple without bruits or JVD. Lungs:  Resp regular and unlabored, CTA. Heart: RRR no s3, s4, or murmurs. Abdomen: Soft, non-tender, non-distended, BS + x 4.  Extremities: No clubbing, cyanosis or edema. DP/PT/Radials 2+ and equal bilaterally.  Labs:  No results found for this basename: CKTOTAL, CKMB, TROPONINI,  in the last 72 hours Lab Results  Component Value Date   WBC 12.5* 05/05/2008   HGB 12.4 05/05/2008   HCT 35.6* 05/05/2008   MCV 88.9 05/05/2008   PLT 217 05/05/2008   No results found for this basename: NA, K, CL, CO2, BUN, CREATININE, CALCIUM, LABALBU, PROT, BILITOT, ALKPHOS, ALT, AST, GLUCOSE,  in the last 168 hours Lab Results  Component Value Date   CHOL  Value: 137        ATP III CLASSIFICATION:  <200     mg/dL   Desirable  696-295  mg/dL   Borderline High  >=284    mg/dL   High        1/32/4401   HDL 43 05/04/2008   LDLCALC  Value: 84        Total Cholesterol/HDL:CHD Risk Coronary Heart Disease Risk Table                     Men   Women  1/2 Average Risk   3.4   3.3  Average Risk       5.0   4.4  2 X Average Risk   9.6   7.1  3 X Average Risk  23.4   11.0        Use the calculated Patient Ratio above and the CHD Risk Table to determine the patient's CHD Risk.        ATP III CLASSIFICATION (LDL):  <100     mg/dL   Optimal  027-253  mg/dL   Near or Above                    Optimal  130-159  mg/dL   Borderline  664-403  mg/dL   High  >474     mg/dL   Very High 2/59/5638   TRIG 48 05/04/2008   No results found for this basename: DDIMER   No components found with this basename: POCBNP,   Accessory Clinical Findings  Echocardiogram none in Epic  ECG - SR,  normal ECG   Assessment & Plan   72 year old female with multiple risk factors for CAD including lifelong smoking, known B/L carotid disease, HTN and hyperlipidemia  1. Typical exertional chest  pain - relieved by rest in 30 minutes, recent visit to the ER, we will draw labs today and schedule an outpatient cath for the next Monday December 1. Prescription for sl NTG given.  2. Hypertension - controlled  3. Hyperlipidemia - managed by PCP, on high dose of atorvastatin 80 mg PO QHS  4. PAD - B/L 60-79% carotid disease, we will repeat Duplex carotids in May 2015   Follow up after the cath  [name], Rexene Edison, MD, [facility] 12/13/2012, 9:22 AM

## 2012-12-20 NOTE — Interval H&P Note (Signed)
History and Physical Interval Note:  12/20/2012 10:26 AM  [name]  has presented today for surgery, with the diagnosis of cp  The various methods of treatment have been discussed with the patient and family. After consideration of risks, benefits and other options for treatment, the patient has consented to  Procedure(s): LEFT HEART CATHETERIZATION WITH CORONARY ANGIOGRAM (N/A) as a surgical intervention .  The patient's history has been reviewed, patient examined, no change in status, stable for surgery.  I have reviewed the patient's chart and labs.  Questions were answered to the patient's satisfaction.    Cath Lab Visit (complete for each Cath Lab visit)  Clinical Evaluation Leading to the Procedure:   ACS: no  Non-ACS:    Anginal Classification: CCS III  Anti-ischemic medical therapy: Minimal Therapy (1 class of medications)  Non-Invasive Test Results: No non-invasive testing performed  Prior CABG: No previous CABG       Tonny Bollman

## 2012-12-21 ENCOUNTER — Encounter (HOSPITAL_COMMUNITY): Payer: Self-pay | Admitting: Physician Assistant

## 2012-12-21 DIAGNOSIS — F329 Major depressive disorder, single episode, unspecified: Secondary | ICD-10-CM | POA: Insufficient documentation

## 2012-12-21 DIAGNOSIS — K2270 Barrett's esophagus without dysplasia: Secondary | ICD-10-CM | POA: Insufficient documentation

## 2012-12-21 DIAGNOSIS — E785 Hyperlipidemia, unspecified: Secondary | ICD-10-CM

## 2012-12-21 DIAGNOSIS — M1990 Unspecified osteoarthritis, unspecified site: Secondary | ICD-10-CM | POA: Insufficient documentation

## 2012-12-21 DIAGNOSIS — I2510 Atherosclerotic heart disease of native coronary artery without angina pectoris: Secondary | ICD-10-CM

## 2012-12-21 DIAGNOSIS — R079 Chest pain, unspecified: Secondary | ICD-10-CM

## 2012-12-21 LAB — BASIC METABOLIC PANEL
BUN: 14 mg/dL (ref 6–23)
CO2: 23 mEq/L (ref 19–32)
Calcium: 8.6 mg/dL (ref 8.4–10.5)
Chloride: 107 mEq/L (ref 96–112)
GFR calc non Af Amer: 85 mL/min — ABNORMAL LOW (ref >90)
Glucose, Bld: 90 mg/dL (ref 70–99)
Potassium: 3.8 mEq/L (ref 3.5–5.1)
Sodium: 139 mEq/L (ref 135–145)

## 2012-12-21 LAB — CBC
HCT: 37.4 % (ref 36.0–46.0)
Hemoglobin: 12.5 g/dL (ref 12.0–15.0)
MCHC: 33.4 g/dL (ref 30.0–36.0)
RBC: 4.08 MIL/uL (ref 3.87–5.11)
WBC: 5.8 10*3/uL (ref 4.0–10.5)

## 2012-12-21 MED ORDER — ASPIRIN EC 81 MG PO TBEC: 81.0000 mg | DELAYED_RELEASE_TABLET | Freq: Every day | ORAL | Status: DC

## 2012-12-21 MED ORDER — PANTOPRAZOLE SODIUM 40 MG PO TBEC
40.0000 mg | DELAYED_RELEASE_TABLET | Freq: Two times a day (BID) | ORAL | Status: DC
Start: 2012-12-21 — End: 2013-04-05

## 2012-12-21 MED ORDER — CLOPIDOGREL BISULFATE 75 MG PO TABS: 75.0000 mg | ORAL_TABLET | Freq: Every day | ORAL | Status: DC

## 2012-12-21 NOTE — Care Management Note (Addendum)
  Page 1 of 1   12/21/2012     2:05:55 PM   CARE MANAGEMENT NOTE 12/21/2012  Patient:  [name], [name]   Account Number:  [id number]  Date Initiated:  12/21/2012  Documentation initiated by:  [facility]  Subjective/Objective Assessment:   72 year old female with h/o hyperlipidemia, hypertension, PAD and CAD who returns today for [name] 1 year follow up.The patient is [name] lifelong smoker.//Hm with spouse      She report new onset of exertional retrosternal chest tightness     Action/Plan:   LEFT HEART CATHETERIZATION WITH CORONARY ANGIOGRAM //benefits check for Plavix   Anticipated DC Date:  12/21/2012   Anticipated DC Plan:  HOME/SELF CARE      DC Planning Services  CM consult  Medication Assistance      Choice offered to / List presented to:             Status of service:  Completed, signed off Medicare Important Message given?   (If response is "NO", the following Medicare IM given date fields will be blank) Date Medicare IM given:   Date Additional Medicare IM given:    Discharge Disposition:    Per UR Regulation:  Reviewed for med. necessity/level of care/duration of stay  If discussed at Long Length of Stay Meetings, dates discussed:    Comments:  12/21/12 1045 Oletta Cohn, RN, BSN, [employer] [phone] Spoke with pt at bedside regarding benefits check for Plavix.  Pt utilizes [facility] in [place] for prescription needs along with [facility] internal pharmacy).  NCM called pharmacy to confirm availability of medication.  Information relayed to pt.  Pt verbalizes importance of filling medication upon discharge.  Pt co-pay will be $6.40 until mail order prescription processed.  [facility] Fax form left on chart to be completed by MD/PA.

## 2012-12-21 NOTE — Discharge Summary (Addendum)
Discharge Summary   Patient ID: [name] MRN: [id number], DOB/AGE: 73/09/1940 73 y.o. Admit date: 12/20/2012 D/C date:     12/21/2012  Primary Care Provider: Gaye Alken, NP Primary Cardiologist: Excell Seltzer  Primary Discharge Diagnoses:  1. CAD/class 3 angina - s/p PTCA/DES to mid-circumflex, PTCA/DES to first OM, residual moderate LAD/RCA dz for med rx 2. HTN 3. HL  Secondary Discharge Diagnoses:  1. Carotid artery disease - 60-70% bilat ICA stenosis 05/2012 2. Depression 3. Barrett's esophagus 4. Arthritis  Hospital Course: [name] is a 72 y/o F with history of HTN, HLD, carotid disease, and moderate CAD by cath in 2010 who presented to [facility] 12/20/2012 for planned cardiac cath. She was recently seen in the office for new onset of exertional retrosternal chest tightness. It started a couple months ago and more recently was occurring with almost any physical activity especially with household chores. The pain would typically resolve with rest in about 30 minutes. She denied any SOB or dizziness. She had recently been seen in the ER in [place] and ruled out. Symptoms were concerning for [place]. She was brought in for cath on 12/20/12 which demonstrated: 1. Severe left circumflex and OM disease treated successfully with PCI (PTCA/DES to each) 2. Moderate LAD and RCA stenosis unchanged from the previous study in 2006.  3. Normal LV function  EF 55-65% DAPT for at least 12 months was recommended. She was continued on her home statin and beta blocker. She tolerated the procedure well. [name] has seen and examined the patient today and feels she is stable for discharge. Per patient request, she received a 30-day rx for local pharmacy and the other rx was sent into mail order. Her omeprazole was also changed to Protonix given Plavix use.  Discharge Vitals: Blood pressure 134/46, pulse 67, temperature 98.4 F (36.9 C), temperature source Oral, resp. rate 18, height 5' (1.524  m), weight 162 lb 11.2 oz (73.8 kg), SpO2 94.00%.  Labs: Lab Results  Component Value Date   WBC 5.8 12/21/2012   HGB 12.5 12/21/2012   HCT 37.4 12/21/2012   MCV 91.7 12/21/2012   PLT 189 12/21/2012     Recent Labs Lab 12/21/12 0525  NA 139  K 3.8  CL 107  CO2 23  BUN 14  CREATININE 0.68  CALCIUM 8.6  GLUCOSE 90    Diagnostic Studies/Procedures   Cardiac catheterization this admission, please [name] full report and above for summary.  Dg Chest 2 View 12/13/2012   CLINICAL DATA:  Chest pain, pressure, prior smoker  EXAM: CHEST  2 VIEW  COMPARISON:  05/04/2008, 09/10/2012  FINDINGS: Hyperinflation noted with mild bronchitic change and interstitial prominence. No definite focal pneumonia, collapse, consolidation, edema, effusion or pneumothorax. Atherosclerosis of the aorta. Trachea is midline. Degenerative changes spine.  IMPRESSION: Hyperinflation and chronic bronchitic change, suspect COPD/emphysema  Cardiomegaly without CHF or pneumonia   Electronically Signed   By: Ruel Favors M.D.   On: 12/13/2012 14:52    Discharge Medications     Medication List    STOP taking these medications       omeprazole 20 MG capsule  Commonly known as:  PRILOSEC      TAKE these medications       aspirin EC 81 MG tablet  Take 1 tablet (81 mg total) by mouth daily.     atorvastatin 80 MG tablet  Commonly known as:  LIPITOR  Take 80 mg by mouth every evening.  clopidogrel 75 MG tablet  Commonly known as:  PLAVIX  Take 1 tablet (75 mg total) by mouth daily.     dicyclomine 10 MG capsule  Commonly known as:  BENTYL  Take 10 mg by mouth daily as needed (stomach cramps).     diphenhydramine-acetaminophen 25-500 MG Tabs  Commonly known as:  TYLENOL PM  Take 2 tablets by mouth at bedtime.     fish oil-omega-3 fatty acids 1000 MG capsule  Take 1,000 mg by mouth daily.     folic acid 800 MCG tablet  Commonly known as:  FOLVITE  Take 800 mcg by mouth daily.     metoprolol  succinate 25 MG 24 hr tablet  Commonly known as:  TOPROL-XL  Take 25 mg by mouth daily.     nitroGLYCERIN 0.4 MG SL tablet  Commonly known as:  NITROSTAT  Place 1 tablet (0.4 mg total) under the tongue every 5 (five) minutes as needed for chest pain.     pantoprazole 40 MG tablet  Commonly known as:  PROTONIX  Take 1 tablet (40 mg total) by mouth 2 (two) times daily.     sertraline 100 MG tablet  Commonly known as:  ZOLOFT  Take 200 mg by mouth daily.        Disposition   The patient will be discharged in stable condition to home. Discharge Orders   Future Appointments Provider Department Dept Phone   01/04/2013 3:30 PM Lars Masson, MD [facility] Office [phone]   Future Orders Complete By Expires   Amb Referral to Cardiac Rehabilitation  As directed    Comments:     Referring to [place] Phase 2   Diet - low sodium heart healthy  As directed    Discharge instructions  As directed    Comments:     The plan is to continue your aspirin and Plavix for at least 12 months.  There are some studies to suggest that Prilosec (omeprazole) makes Plavix less effective. Take Protonix instead of Prilosec for less chance of interaction. If you tolerate Protonix OK, follow up with your PCP for refills.   Increase activity slowly  As directed    Comments:     No driving for 2 days. No lifting over 5 lbs for 1 week. No sexual activity for 1 week. Keep procedure site clean & dry. If you notice increased pain, swelling, bleeding or pus, call/return!  You may shower, but no soaking baths/hot tubs/pools for 1 week.     Follow-up Information   Follow up with Lars Masson, MD. (01/04/13 at 3:30pm)    Specialty:  Cardiology   Contact information:   [address] [place] [address] [phone]         Duration of Discharge Encounter: Greater than 30 minutes including physician and PA time.  Signed, Ronie Spies PA-C 12/21/2012, 9:13  AM   [name], H 12/21/2012

## 2012-12-21 NOTE — Progress Notes (Signed)
CARDIAC REHAB PHASE I   PRE:  Rate/Rhythm: 62SR  BP:  Supine: 134/46  Sitting:   Standing:    SaO2:   MODE:  Ambulation: 500 ft   POST:  Rate/Rhythm: 86SR  BP:  Supine:   Sitting: 148/73  Standing:    SaO2:  0810-0905 Pt walked 500 ft with steady gait. No CP, tolerated well. Education completed with pt and husband and understanding voiced. Gave pt smoking cessation handouts and encouraged pt to call 1800quitnow as needed for coaching. Pt has quit in the past but does not know her plan for quitting at this time. Discussed CRP 2 and permission given to refer to [facility] program. Pt states has no money for plavix and will not be able to afford. Discussed with pt's RN. Case manager to see.   Luetta Nutting, RN BSN  12/21/2012 9:00 AM

## 2012-12-21 NOTE — Progress Notes (Signed)
Patient Name: [name] Date of Encounter: 12/21/2012  Active Problems:   Other and unspecified angina pectoris   Length of Stay: 1  SUBJECTIVE  Feels well, no chest pain.  CURRENT MEDS . diphenhydrAMINE  50 mg Oral QHS   And  . acetaminophen  1,000 mg Oral QHS  . aspirin EC  162 mg Oral Daily  . atorvastatin  80 mg Oral QPM  . clopidogrel  75 mg Oral Q breakfast  . folic acid  1 mg Oral Daily  . metoprolol succinate  25 mg Oral Q0600  . pneumococcal 23 valent vaccine  0.5 mL Intramuscular Tomorrow-1000  . sertraline  200 mg Oral Daily    OBJECTIVE  Filed Vitals:   12/20/12 2006 12/20/12 2358 12/21/12 0116 12/21/12 0531  BP: 122/35 118/46  107/36  Pulse: 69 62  61  Temp: 98.2 F (36.8 C) 98.1 F (36.7 C)  98.1 F (36.7 C)  TempSrc: Oral Oral  Oral  Resp: 17 15  18   Height:      Weight:   162 lb 11.2 oz (73.8 kg)   SpO2: 95% 96%  94%    Intake/Output Summary (Last 24 hours) at 12/21/12 0812 Last data filed at 12/21/12 0008  Gross per 24 hour  Intake 1214.9 ml  Output   1400 ml  Net -185.1 ml   Filed Weights   12/20/12 0707 12/21/12 0116  Weight: 162 lb (73.483 kg) 162 lb 11.2 oz (73.8 kg)    PHYSICAL EXAM  General: Pleasant, NAD. Neuro: Alert and oriented X 3. Moves all extremities spontaneously. Psych: Normal affect. HEENT:  Normal  Neck: Supple without bruits or JVD. Lungs:  Resp regular and unlabored, CTA. Heart: RRR no s3, s4, or murmurs. Abdomen: Soft, non-tender, non-distended, BS + x 4.  Extremities: No clubbing, cyanosis or edema. DP/PT/Radials 2+ and equal bilaterally. Right hand with good radial pulses distally, no tingling or pain, normal Allen test  Accessory Clinical Findings  CBC  Recent Labs  12/21/12 0525  WBC 5.8  HGB 12.5  HCT 37.4  MCV 91.7  PLT 189   Basic Metabolic Panel  Recent Labs  12/21/12 0525  NA 139  K 3.8  CL 107  CO2 23  GLUCOSE 90  BUN 14  CREATININE 0.68  CALCIUM 8.6   TELE: SR  in 60'  ECG: SR, 62 BPM, normal ECG  Cath 12/20/2012 Data:  Lesion 1  Vessel - left circumflex/Segment - mid  Percent Stenosis (pre) 95  TIMI-flow 3  Stent 2.5x16 mm Promus DES  Percent Stenosis (post) 0  TIMI-flow (post) 3  Lesion 2  Vessel - OM1/Segment - mid  Percent Stenosis (pre) 80  TIMI-flow 3  Stent 2.25x12 mm Promus DES  Percent Stenosis (post) 0  TIMI-flow (post) 3  Final Conclusions:  1. Severe left circumflex and OM disease treated successfully with PCI  2. Moderate LAD and RCA stenosis unchanged from the previous study in 2006.  3. Normal LV function  Recommendations:  DAPT with ASA and Plavix for at least 12 months. Continued aggressive medical therapy.    ASSESSMENT AND PLAN  72 year old female with multiple risk factors for CAD including lifelong smoking, known B/L carotid disease, HTN and hyperlipidemia   1. CAD - presented with typical exertional chest pain, cath with severe LCX disease, s/p successful PCI to LCX and OM1, plan: DAPT x 1 year minimum - continue BB< high dose statin  2. Hypertension - controlled  3. Hyperlipidemia - on high dose of atorvastatin 80 mg PO QHS  4. PAD - B/L 60-79% carotid disease, we will repeat Duplex carotids in May 2015  Follow up in my clinic within the next 10 days.  Signed, [name], H MD, [facility] 12/21/2012

## 2013-01-04 ENCOUNTER — Encounter: Payer: Self-pay | Admitting: Cardiology

## 2013-01-04 ENCOUNTER — Ambulatory Visit (INDEPENDENT_AMBULATORY_CARE_PROVIDER_SITE_OTHER): Payer: Medicare HMO | Admitting: Cardiology

## 2013-01-04 VITALS — BP 120/84 | HR 74 | Ht 60.0 in | Wt 160.0 lb

## 2013-01-04 DIAGNOSIS — I209 Angina pectoris, unspecified: Secondary | ICD-10-CM

## 2013-01-04 DIAGNOSIS — E785 Hyperlipidemia, unspecified: Secondary | ICD-10-CM

## 2013-01-04 DIAGNOSIS — I10 Essential (primary) hypertension: Secondary | ICD-10-CM

## 2013-01-04 DIAGNOSIS — I779 Disorder of arteries and arterioles, unspecified: Secondary | ICD-10-CM

## 2013-01-04 DIAGNOSIS — I2510 Atherosclerotic heart disease of native coronary artery without angina pectoris: Secondary | ICD-10-CM

## 2013-01-04 NOTE — Progress Notes (Signed)
Patient ID: NEAH SPORRER, female   DOB: 1940-02-12, 72 y.o.   MRN: [id number]    Patient Name: [name] Date of Encounter: 01/04/2013  Primary Care Provider:  Gaye Alken, NP Primary Cardiologist:  [name], H  Problem List   Past Medical History  Diagnosis Date  . Carotid artery disease     a. 60-70% bilat ICA stenosis by dopplers 05/2012.  Marland Kitchen Other and unspecified hyperlipidemia   . CAD (coronary artery disease)     a. Mod dz 2010 initially mgd medically. b. 12/2012 - angina s/p PTCA/DES to mid-circumflex, PTCA/DES to first OM.   Marland Kitchen Hypertension   . Heart murmur   . Depression   . Barrett's esophagus   . Arthritis    Past Surgical History  Procedure Laterality Date  . Appendectomy    . Cholecystectomy    . Colon resection    . 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 Rotator cuff repair Left   . Abdominal hysterectomy    . Colon surgery    . Foot surgery    . Hand surgery      Allergies  Allergies  Allergen Reactions  . Pregabalin Swelling    Tongue swelling  . Sulfonamide Derivatives Swelling    Childhood reaction   HPI  [name] was previously followed by Dr Daleen Squibb. She is a pleasant 72 year old female with h/o hyperlipidemia, hypertension, PAD and CAD who returns today for a 1 year follow up. The patient is a lifelong smoker.  She report new onset of exertional retrosternal chest tightness that started couple months ago and currently appears to almost any physical activity especially with household chores. The patient states that when she takes a break the pain goes away in about 30 minutes. These pain brought her to the [place] [facility] and acute coronary syndrome was ruled out. The patient denies shortness of breath or dizziness associated with her chest tightness. The chest tightness is retrosternal pressure-like, without any radiation.  The patient underwent a cardiac cath on 12/20/2012 with  finding of severe stenosis in OM1 and received 2 DES.  Today she states that her chest pain has resolved but she feels out of shape.   Home Medications  Prior to Admission medications   Medication Sig Start Date End Date Taking? Authorizing Provider  aspirin 81 MG tablet Take 81 mg by mouth 2 (two) times daily.    Yes Historical Provider, MD  atorvastatin (LIPITOR) 80 MG tablet Take 80 mg by mouth daily.   Yes Historical Provider, MD  dicyclomine (BENTYL) 10 MG capsule as directed. 08/20/10  Yes Historical Provider, MD  diphenhydramine-acetaminophen (TYLENOL PM) 25-500 MG TABS Take 1 tablet by mouth at bedtime as needed.     Yes Historical Provider, MD  fish oil-omega-3 fatty acids 1000 MG capsule Take 1 g by mouth daily.     Yes Historical Provider, MD  folic acid (FOLVITE) 800 MCG tablet Take 400 mcg by mouth daily.     Yes Historical Provider, MD  metoprolol succinate (TOPROL-XL) 25 MG 24 hr tablet TAKE 1 TABLET ONCE DAILY. 05/04/12  Yes [name] Shih, MD  nitroGLYCERIN (NITROSTAT) 0.4 MG SL tablet Place 0.4 mg under the tongue every 5 (five) minutes as needed.     Yes Historical Provider, MD  omeprazole (PRILOSEC) 20 MG capsule Take 20 mg by mouth 2 (two) times daily.  Yes Historical Provider, MD  sertraline (ZOLOFT) 100 MG tablet Take 150 mg by mouth daily.    Yes Historical Provider, MD    Family History  No family history on file.  Social History  History   Social History  . Marital Status: Married    Spouse Name: N/A    Number of Children: N/A  . Years of Education: N/A   Occupational History  . full time    Social History Main Topics  . Smoking status: Light Tobacco Smoker    Types: Cigarettes  . Smokeless tobacco: Never Used  . Alcohol Use: No  . Drug Use: No  . Sexual Activity: Not on file   Other Topics Concern  . Not on file   Social History Narrative  . No narrative on file     Review of Systems, as per HPI, otherwise negative General:  No chills,  fever, night sweats or weight changes.  Cardiovascular:  No chest pain, dyspnea on exertion, edema, orthopnea, palpitations, paroxysmal nocturnal dyspnea. Dermatological: No rash, lesions/masses Respiratory: No cough, dyspnea Urologic: No hematuria, dysuria Abdominal:   No nausea, vomiting, diarrhea, bright red blood per rectum, melena, or hematemesis Neurologic:  No visual changes, wkns, changes in mental status. All other systems reviewed and are otherwise negative except as noted above.  Physical Exam  Blood pressure 120/84, pulse 74, height 5' (1.524 m), weight 160 lb (72.576 kg), SpO2 97.00%.  General: Pleasant, NAD Psych: Normal affect. Neuro: Alert and oriented X 3. Moves all extremities spontaneously. HEENT: Normal  Neck: Supple without bruits or JVD. Lungs:  Resp regular and unlabored, CTA. Heart: RRR no s3, s4, or murmurs. Abdomen: Soft, non-tender, non-distended, BS + x 4.  Extremities: No clubbing, cyanosis or edema. DP/PT/Radials 2+ and equal bilaterally.  Labs:  No results found for this basename: CKTOTAL, CKMB, TROPONINI,  in the last 72 hours Lab Results  Component Value Date   WBC 5.8 12/21/2012   HGB 12.5 12/21/2012   HCT 37.4 12/21/2012   MCV 91.7 12/21/2012   PLT 189 12/21/2012   No results found for this basename: NA, K, CL, CO2, BUN, CREATININE, CALCIUM, LABALBU, PROT, BILITOT, ALKPHOS, ALT, AST, GLUCOSE,  in the last 168 hours Lab Results  Component Value Date   CHOL  Value: 137        ATP III CLASSIFICATION:  <200     mg/dL   Desirable  454-098  mg/dL   Borderline High  >=119    mg/dL   High        1/47/8295   HDL 43 05/04/2008   LDLCALC  Value: 84        Total Cholesterol/HDL:CHD Risk Coronary Heart Disease Risk Table                     Men   Women  1/2 Average Risk   3.4   3.3  Average Risk       5.0   4.4  2 X Average Risk   9.6   7.1  3 X Average Risk  23.4   11.0        Use the calculated Patient Ratio above and the CHD Risk Table to determine the  patient's CHD Risk.        ATP III CLASSIFICATION (LDL):  <100     mg/dL   Optimal  621-308  mg/dL   Near or Above  Optimal  130-159  mg/dL   Borderline  213-086  mg/dL   High  >578     mg/dL   Very High 4/69/6295   TRIG 48 05/04/2008   No results found for this basename: DDIMER   No components found with this basename: POCBNP,   Accessory Clinical Findings  Echocardiogram none in Epic  ECG - SR, normal ECG  Cardiac cath 12/20/2012   Assessment & Plan   72 year old female with multiple risk factors for CAD including lifelong smoking, known B/L carotid disease, HTN and hyperlipidemia  1. Typical exertional chest pain - Severe left circumflex and OM disease treated successfully with PCI x 2 on 12/20/2012. DAT recommended for 1 year.  We will refer the patient to the cardiac rehab.   2. Hypertension - controlled  3. Hyperlipidemia - managed by PCP, on high dose of atorvastatin 80 mg PO QHS  4. PAD - B/L 60-79% carotid disease, we will repeat Duplex carotids in May 2015   Follow up in 3 months with CBC, CMP.  Lars Masson, MD, [facility] 01/04/2013, 3:33 PM

## 2013-01-04 NOTE — Patient Instructions (Addendum)
Start Cardiac Rehab at [facility]    Your physician recommends that you schedule a follow-up appointment in: 3 months with a cbc,cmet

## 2013-03-31 ENCOUNTER — Ambulatory Visit (INDEPENDENT_AMBULATORY_CARE_PROVIDER_SITE_OTHER): Payer: Medicare HMO | Admitting: Cardiology

## 2013-03-31 ENCOUNTER — Encounter: Payer: Self-pay | Admitting: Cardiology

## 2013-03-31 ENCOUNTER — Other Ambulatory Visit: Payer: Commercial Managed Care - HMO

## 2013-03-31 ENCOUNTER — Telehealth: Payer: Self-pay | Admitting: Cardiology

## 2013-03-31 VITALS — BP 118/64 | HR 80 | Ht 60.0 in | Wt 162.0 lb

## 2013-03-31 DIAGNOSIS — E785 Hyperlipidemia, unspecified: Secondary | ICD-10-CM

## 2013-03-31 DIAGNOSIS — I6529 Occlusion and stenosis of unspecified carotid artery: Secondary | ICD-10-CM

## 2013-03-31 NOTE — Patient Instructions (Addendum)
   Your physician has requested that you have a carotid duplex. This test is an ultrasound of the carotid arteries in your neck. It looks at blood flow through these arteries that supply the brain with blood. Allow one hour for this exam. There are no restrictions or special instructions.  Your physician wants you to follow-up in: 6 months with Dr. Meda Coffee. You will receive a reminder letter in the mail two months in advance. If you don't receive a letter, please call our office to schedule the follow-up appointment.

## 2013-03-31 NOTE — Progress Notes (Signed)
Patient ID: [name], female   DOB: 25-Aug-1940, 73 y.o.   MRN: [id number]    Patient Name: [name] Date of Encounter: 03/31/2013  Primary Care Provider:  Laverna Peace, NP Primary Cardiologist:  Dorothy Spark  Problem List   Past Medical History  Diagnosis Date  . Carotid artery disease     a. 60-70% bilat ICA stenosis by dopplers 05/2012.  Marland Kitchen Other and unspecified hyperlipidemia   . CAD (coronary artery disease)     a. Mod dz 2010 initially mgd medically. b. 12/2012 - angina s/p PTCA/DES to mid-circumflex, PTCA/DES to first OM.   Marland Kitchen Hypertension   . Heart murmur   . Depression   . Barrett's esophagus   . Arthritis    Past Surgical History  Procedure Laterality Date  . Appendectomy    . Cholecystectomy    . Colon resection    . 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 Rotator cuff repair Left   . Abdominal hysterectomy    . Colon surgery    . Foot surgery    . Hand surgery      Allergies  Allergies  Allergen Reactions  . Pregabalin Swelling    Tongue swelling  . Sulfonamide Derivatives Swelling    Childhood reaction   HPI  [name] was previously followed by Dr Verl Blalock. She is a pleasant 73 year old female with h/o hyperlipidemia, hypertension, PAD and CAD who returns today for a 1 year follow up. The patient is a lifelong smoker.  She report new onset of exertional retrosternal chest tightness that started couple months ago and currently appears to almost any physical activity especially with household chores. The patient states that when she takes a break the pain goes away in about 30 minutes. These pain brought her to the [place] ER and acute coronary syndrome was ruled out. The patient denies shortness of breath or dizziness associated with her chest tightness. The chest tightness is retrosternal pressure-like, without any radiation.  The patient underwent a cardiac cath on 12/20/2012 with  finding of severe stenosis in OM1 and received 2 DES.   Today patient reports that over the course of the last 3 months she only had one episode of nonexertional chest pain that resolved in 7 minutes after taking nitroglycerin. The patient was going to cardiac rehabilitation for while until her insurance was covering. She is now trying to exercise on her own and increasing the time and distance. She denies a dyspnea on exertion, dizziness, syncope, lower extremity edema. She doesn't have any muscle pain.  Home Medications  Prior to Admission medications   Medication Sig Start Date End Date Taking? Authorizing Provider  aspirin 81 MG tablet Take 81 mg by mouth 2 (two) times daily.    Yes Historical Provider, MD  atorvastatin (LIPITOR) 80 MG tablet Take 80 mg by mouth daily.   Yes Historical Provider, MD  dicyclomine (BENTYL) 10 MG capsule as directed. 08/20/10  Yes Historical Provider, MD  diphenhydramine-acetaminophen (TYLENOL PM) 25-500 MG TABS Take 1 tablet by mouth at bedtime as needed.     Yes Historical Provider, MD  fish oil-omega-3 fatty acids 1000 MG capsule Take 1 g by mouth daily.     Yes Historical Provider, MD  folic acid (FOLVITE) 993 MCG tablet Take 400 mcg by mouth daily.     Yes Historical Provider, MD  metoprolol succinate (  TOPROL-XL) 25 MG 24 hr tablet TAKE 1 TABLET ONCE DAILY. 05/04/12  Yes Renella Cunas, MD  nitroGLYCERIN (NITROSTAT) 0.4 MG SL tablet Place 0.4 mg under the tongue every 5 (five) minutes as needed.     Yes Historical Provider, MD  omeprazole (PRILOSEC) 20 MG capsule Take 20 mg by mouth 2 (two) times daily.     Yes Historical Provider, MD  sertraline (ZOLOFT) 100 MG tablet Take 150 mg by mouth daily.    Yes Historical Provider, MD    Family History  No family history on file.  Social History  History   Social History  . Marital Status: Married    Spouse Name: N/A    Number of Children: N/A  . Years of Education: N/A   Occupational History  . full  time    Social History Main Topics  . Smoking status: Light Tobacco Smoker    Types: Cigarettes  . Smokeless tobacco: Never Used  . Alcohol Use: No  . Drug Use: No  . Sexual Activity: Not on file   Other Topics Concern  . Not on file   Social History Narrative  . No narrative on file     Review of Systems, as per HPI, otherwise negative General:  No chills, fever, night sweats or weight changes.  Cardiovascular:  No chest pain, dyspnea on exertion, edema, orthopnea, palpitations, paroxysmal nocturnal dyspnea. Dermatological: No rash, lesions/masses Respiratory: No cough, dyspnea Urologic: No hematuria, dysuria Abdominal:   No nausea, vomiting, diarrhea, bright red blood per rectum, melena, or hematemesis Neurologic:  No visual changes, wkns, changes in mental status. All other systems reviewed and are otherwise negative except as noted above.  Physical Exam  Blood pressure 118/64, pulse 80, height 5' (1.524 m), weight 162 lb (73.483 kg).  General: Pleasant, NAD Psych: Normal affect. Neuro: Alert and oriented X 3. Moves all extremities spontaneously. HEENT: Normal  Neck: Supple without bruits or JVD. Lungs:  Resp regular and unlabored, CTA. Heart: RRR no s3, s4, or murmurs. Abdomen: Soft, non-tender, non-distended, BS + x 4.  Extremities: No clubbing, cyanosis or edema. DP/PT/Radials 2+ and equal bilaterally.  Labs:  No results found for this basename: CKTOTAL, CKMB, TROPONINI,  in the last 72 hours Lab Results  Component Value Date   WBC 5.8 12/21/2012   HGB 12.5 12/21/2012   HCT 37.4 12/21/2012   MCV 91.7 12/21/2012   PLT 189 12/21/2012   No results found for this basename: NA, K, CL, CO2, BUN, CREATININE, CALCIUM, LABALBU, PROT, BILITOT, ALKPHOS, ALT, AST, GLUCOSE,  in the last 168 hours Lab Results  Component Value Date   CHOL  Value: 137        ATP III CLASSIFICATION:  <200     mg/dL   Desirable  200-239  mg/dL   Borderline High  >=240    mg/dL   High         05/04/2008   HDL 43 05/04/2008   LDLCALC  Value: 84        Total Cholesterol/HDL:CHD Risk Coronary Heart Disease Risk Table                     Men   Women  1/2 Average Risk   3.4   3.3  Average Risk       5.0   4.4  2 X Average Risk   9.6   7.1  3 X Average Risk  23.4   11.0  Use the calculated Patient Ratio above and the CHD Risk Table to determine the patient's CHD Risk.        ATP III CLASSIFICATION (LDL):  <100     mg/dL   Optimal  100-129  mg/dL   Near or Above                    Optimal  130-159  mg/dL   Borderline  160-189  mg/dL   High  >190     mg/dL   Very High 05/04/2008   TRIG 48 05/04/2008   No results found for this basename: DDIMER   No components found with this basename: POCBNP,   Accessory Clinical Findings  Echocardiogram none in Epic  ECG - SR, normal ECG  Cardiac cath 12/20/2012   Assessment & Plan   73 year old female with multiple risk factors for CAD including lifelong smoking, known B/L carotid disease, HTN and hyperlipidemia  1. CAD- stable - Severe left circumflex and OM disease treated successfully with PCI x 2 on 12/20/2012. DAT recommended for 1 year.   2. Hypertension - controlled   3. Hyperlipidemia - managed by PCP, on high dose of atorvastatin 80 mg PO QHS, we'll check liver enzymes today   4. PAD - B/L 60-79% carotid disease, we will repeat Duplex carotids in May 2015   Follow up in 6 months.  Dorothy Spark, MD, [facility] 03/31/2013, 2:10 PM

## 2013-03-31 NOTE — Telephone Encounter (Signed)
ROI faxed to [facility] @ [phone]

## 2013-04-05 ENCOUNTER — Other Ambulatory Visit: Payer: Self-pay

## 2013-04-05 MED ORDER — PANTOPRAZOLE SODIUM 40 MG PO TBEC: 40.0000 mg | DELAYED_RELEASE_TABLET | Freq: Two times a day (BID) | ORAL | Status: DC

## 2013-04-06 ENCOUNTER — Encounter: Payer: Self-pay | Admitting: Cardiovascular Disease

## 2013-04-06 ENCOUNTER — Ambulatory Visit (HOSPITAL_COMMUNITY): Payer: Medicare HMO | Attending: Cardiovascular Disease | Admitting: Cardiology

## 2013-04-06 DIAGNOSIS — I6529 Occlusion and stenosis of unspecified carotid artery: Secondary | ICD-10-CM | POA: Insufficient documentation

## 2013-04-06 DIAGNOSIS — Z8673 Personal history of transient ischemic attack (TIA), and cerebral infarction without residual deficits: Secondary | ICD-10-CM | POA: Insufficient documentation

## 2013-04-06 DIAGNOSIS — E785 Hyperlipidemia, unspecified: Secondary | ICD-10-CM | POA: Insufficient documentation

## 2013-04-06 DIAGNOSIS — I2510 Atherosclerotic heart disease of native coronary artery without angina pectoris: Secondary | ICD-10-CM | POA: Insufficient documentation

## 2013-04-06 DIAGNOSIS — I658 Occlusion and stenosis of other precerebral arteries: Secondary | ICD-10-CM | POA: Insufficient documentation

## 2013-04-06 DIAGNOSIS — F17200 Nicotine dependence, unspecified, uncomplicated: Secondary | ICD-10-CM | POA: Insufficient documentation

## 2013-04-06 NOTE — Progress Notes (Signed)
Carotid duplex complete 

## 2013-04-08 ENCOUNTER — Telehealth: Payer: Self-pay | Admitting: Cardiology

## 2013-04-08 NOTE — Telephone Encounter (Signed)
Records rec From [facility] to Operator Room

## 2013-04-11 ENCOUNTER — Other Ambulatory Visit: Payer: Self-pay | Admitting: *Deleted

## 2013-04-11 MED ORDER — PANTOPRAZOLE SODIUM 40 MG PO TBEC: 40.0000 mg | DELAYED_RELEASE_TABLET | Freq: Two times a day (BID) | ORAL | Status: DC

## 2013-07-20 DIAGNOSIS — J9601 Acute respiratory failure with hypoxia: Secondary | ICD-10-CM

## 2013-07-20 HISTORY — DX: Acute respiratory failure with hypoxia: J96.01

## 2013-07-29 ENCOUNTER — Inpatient Hospital Stay (HOSPITAL_COMMUNITY): Payer: Medicare HMO

## 2013-07-29 ENCOUNTER — Emergency Department (HOSPITAL_COMMUNITY): Payer: Medicare HMO

## 2013-07-29 ENCOUNTER — Encounter (HOSPITAL_COMMUNITY): Payer: Self-pay | Admitting: Radiology

## 2013-07-29 ENCOUNTER — Inpatient Hospital Stay (HOSPITAL_COMMUNITY)
Admission: EM | Admit: 2013-07-29 | Discharge: 2013-08-26 | DRG: 003 | Disposition: A | Discharge status: Skilled Nursing Facility | Payer: Medicare HMO | Attending: Internal Medicine | Admitting: Internal Medicine

## 2013-07-29 DIAGNOSIS — L500 Allergic urticaria: Secondary | ICD-10-CM | POA: Diagnosis not present

## 2013-07-29 DIAGNOSIS — Z66 Do not resuscitate: Secondary | ICD-10-CM | POA: Diagnosis not present

## 2013-07-29 DIAGNOSIS — Y833 Surgical operation with formation of external stoma as the cause of abnormal reaction of the patient, or of later complication, without mention of misadventure at the time of the procedure: Secondary | ICD-10-CM | POA: Diagnosis not present

## 2013-07-29 DIAGNOSIS — A419 Sepsis, unspecified organism: Secondary | ICD-10-CM | POA: Diagnosis not present

## 2013-07-29 DIAGNOSIS — K56609 Unspecified intestinal obstruction, unspecified as to partial versus complete obstruction: Secondary | ICD-10-CM | POA: Diagnosis not present

## 2013-07-29 DIAGNOSIS — Z7902 Long term (current) use of antithrombotics/antiplatelets: Secondary | ICD-10-CM | POA: Diagnosis not present

## 2013-07-29 DIAGNOSIS — R1310 Dysphagia, unspecified: Secondary | ICD-10-CM | POA: Diagnosis present

## 2013-07-29 DIAGNOSIS — I779 Disorder of arteries and arterioles, unspecified: Secondary | ICD-10-CM

## 2013-07-29 DIAGNOSIS — I2584 Coronary atherosclerosis due to calcified coronary lesion: Secondary | ICD-10-CM | POA: Diagnosis not present

## 2013-07-29 DIAGNOSIS — R578 Other shock: Secondary | ICD-10-CM

## 2013-07-29 DIAGNOSIS — E785 Hyperlipidemia, unspecified: Secondary | ICD-10-CM

## 2013-07-29 DIAGNOSIS — Y9210 Unspecified residential institution as the place of occurrence of the external cause: Secondary | ICD-10-CM | POA: Diagnosis not present

## 2013-07-29 DIAGNOSIS — K929 Disease of digestive system, unspecified: Secondary | ICD-10-CM | POA: Diagnosis not present

## 2013-07-29 DIAGNOSIS — I428 Other cardiomyopathies: Secondary | ICD-10-CM | POA: Diagnosis present

## 2013-07-29 DIAGNOSIS — I82401 Acute embolism and thrombosis of unspecified deep veins of right lower extremity: Secondary | ICD-10-CM

## 2013-07-29 DIAGNOSIS — E8779 Other fluid overload: Secondary | ICD-10-CM | POA: Diagnosis not present

## 2013-07-29 DIAGNOSIS — D62 Acute posthemorrhagic anemia: Secondary | ICD-10-CM | POA: Diagnosis not present

## 2013-07-29 DIAGNOSIS — Z933 Colostomy status: Secondary | ICD-10-CM

## 2013-07-29 DIAGNOSIS — K660 Peritoneal adhesions (postprocedural) (postinfection): Secondary | ICD-10-CM | POA: Diagnosis present

## 2013-07-29 DIAGNOSIS — IMO0002 Reserved for concepts with insufficient information to code with codable children: Secondary | ICD-10-CM | POA: Diagnosis not present

## 2013-07-29 DIAGNOSIS — F329 Major depressive disorder, single episode, unspecified: Secondary | ICD-10-CM | POA: Diagnosis present

## 2013-07-29 DIAGNOSIS — I2510 Atherosclerotic heart disease of native coronary artery without angina pectoris: Secondary | ICD-10-CM | POA: Diagnosis present

## 2013-07-29 DIAGNOSIS — Z79899 Other long term (current) drug therapy: Secondary | ICD-10-CM | POA: Diagnosis not present

## 2013-07-29 DIAGNOSIS — I10 Essential (primary) hypertension: Secondary | ICD-10-CM

## 2013-07-29 DIAGNOSIS — I82A11 Acute embolism and thrombosis of right axillary vein: Secondary | ICD-10-CM

## 2013-07-29 DIAGNOSIS — F17200 Nicotine dependence, unspecified, uncomplicated: Secondary | ICD-10-CM

## 2013-07-29 DIAGNOSIS — I739 Peripheral vascular disease, unspecified: Secondary | ICD-10-CM

## 2013-07-29 DIAGNOSIS — E872 Acidosis, unspecified: Secondary | ICD-10-CM | POA: Diagnosis not present

## 2013-07-29 DIAGNOSIS — Z9049 Acquired absence of other specified parts of digestive tract: Secondary | ICD-10-CM

## 2013-07-29 DIAGNOSIS — B3749 Other urogenital candidiasis: Secondary | ICD-10-CM | POA: Diagnosis present

## 2013-07-29 DIAGNOSIS — E860 Dehydration: Secondary | ICD-10-CM

## 2013-07-29 DIAGNOSIS — E870 Hyperosmolality and hypernatremia: Secondary | ICD-10-CM

## 2013-07-29 DIAGNOSIS — Z7982 Long term (current) use of aspirin: Secondary | ICD-10-CM

## 2013-07-29 DIAGNOSIS — I82A19 Acute embolism and thrombosis of unspecified axillary vein: Secondary | ICD-10-CM | POA: Diagnosis not present

## 2013-07-29 DIAGNOSIS — E876 Hypokalemia: Secondary | ICD-10-CM

## 2013-07-29 DIAGNOSIS — F3289 Other specified depressive episodes: Secondary | ICD-10-CM

## 2013-07-29 DIAGNOSIS — R6520 Severe sepsis without septic shock: Secondary | ICD-10-CM

## 2013-07-29 DIAGNOSIS — Z9861 Coronary angioplasty status: Secondary | ICD-10-CM

## 2013-07-29 DIAGNOSIS — R339 Retention of urine, unspecified: Secondary | ICD-10-CM | POA: Diagnosis not present

## 2013-07-29 DIAGNOSIS — J189 Pneumonia, unspecified organism: Secondary | ICD-10-CM | POA: Diagnosis not present

## 2013-07-29 DIAGNOSIS — K5669 Other intestinal obstruction: Secondary | ICD-10-CM | POA: Diagnosis present

## 2013-07-29 DIAGNOSIS — T367X5A Adverse effect of antifungal antibiotics, systemically used, initial encounter: Secondary | ICD-10-CM | POA: Diagnosis not present

## 2013-07-29 DIAGNOSIS — R5381 Other malaise: Secondary | ICD-10-CM

## 2013-07-29 DIAGNOSIS — G894 Chronic pain syndrome: Secondary | ICD-10-CM | POA: Diagnosis present

## 2013-07-29 DIAGNOSIS — J9600 Acute respiratory failure, unspecified whether with hypoxia or hypercapnia: Secondary | ICD-10-CM | POA: Diagnosis not present

## 2013-07-29 DIAGNOSIS — Z781 Physical restraint status: Secondary | ICD-10-CM | POA: Diagnosis not present

## 2013-07-29 DIAGNOSIS — T40605A Adverse effect of unspecified narcotics, initial encounter: Secondary | ICD-10-CM | POA: Diagnosis not present

## 2013-07-29 DIAGNOSIS — K560 Paralytic ileus: Secondary | ICD-10-CM | POA: Diagnosis present

## 2013-07-29 DIAGNOSIS — R109 Unspecified abdominal pain: Secondary | ICD-10-CM | POA: Diagnosis present

## 2013-07-29 DIAGNOSIS — I619 Nontraumatic intracerebral hemorrhage, unspecified: Secondary | ICD-10-CM | POA: Diagnosis not present

## 2013-07-29 DIAGNOSIS — K565 Intestinal adhesions [bands], unspecified as to partial versus complete obstruction: Secondary | ICD-10-CM

## 2013-07-29 DIAGNOSIS — K5900 Constipation, unspecified: Secondary | ICD-10-CM | POA: Diagnosis present

## 2013-07-29 DIAGNOSIS — Z930 Tracheostomy status: Secondary | ICD-10-CM

## 2013-07-29 DIAGNOSIS — D649 Anemia, unspecified: Secondary | ICD-10-CM | POA: Diagnosis not present

## 2013-07-29 DIAGNOSIS — E871 Hypo-osmolality and hyponatremia: Secondary | ICD-10-CM | POA: Diagnosis not present

## 2013-07-29 DIAGNOSIS — I472 Ventricular tachycardia, unspecified: Secondary | ICD-10-CM | POA: Diagnosis not present

## 2013-07-29 DIAGNOSIS — K5289 Other specified noninfective gastroenteritis and colitis: Principal | ICD-10-CM | POA: Diagnosis present

## 2013-07-29 DIAGNOSIS — Z6830 Body mass index (BMI) 30.0-30.9, adult: Secondary | ICD-10-CM

## 2013-07-29 DIAGNOSIS — I4729 Other ventricular tachycardia: Secondary | ICD-10-CM | POA: Diagnosis not present

## 2013-07-29 DIAGNOSIS — T8131XA Disruption of external operation (surgical) wound, not elsewhere classified, initial encounter: Secondary | ICD-10-CM | POA: Diagnosis not present

## 2013-07-29 DIAGNOSIS — Z9911 Dependence on respirator [ventilator] status: Secondary | ICD-10-CM

## 2013-07-29 DIAGNOSIS — J9601 Acute respiratory failure with hypoxia: Secondary | ICD-10-CM

## 2013-07-29 DIAGNOSIS — F32A Depression, unspecified: Secondary | ICD-10-CM

## 2013-07-29 DIAGNOSIS — K2270 Barrett's esophagus without dysplasia: Secondary | ICD-10-CM | POA: Diagnosis present

## 2013-07-29 DIAGNOSIS — R7309 Other abnormal glucose: Secondary | ICD-10-CM | POA: Diagnosis not present

## 2013-07-29 DIAGNOSIS — J80 Acute respiratory distress syndrome: Secondary | ICD-10-CM

## 2013-07-29 DIAGNOSIS — G9340 Encephalopathy, unspecified: Secondary | ICD-10-CM | POA: Diagnosis not present

## 2013-07-29 DIAGNOSIS — R601 Generalized edema: Secondary | ICD-10-CM

## 2013-07-29 DIAGNOSIS — I609 Nontraumatic subarachnoid hemorrhage, unspecified: Secondary | ICD-10-CM

## 2013-07-29 DIAGNOSIS — I420 Dilated cardiomyopathy: Secondary | ICD-10-CM

## 2013-07-29 DIAGNOSIS — I517 Cardiomegaly: Secondary | ICD-10-CM | POA: Diagnosis not present

## 2013-07-29 DIAGNOSIS — E440 Moderate protein-calorie malnutrition: Secondary | ICD-10-CM

## 2013-07-29 DIAGNOSIS — M1990 Unspecified osteoarthritis, unspecified site: Secondary | ICD-10-CM

## 2013-07-29 DIAGNOSIS — I2583 Coronary atherosclerosis due to lipid rich plaque: Secondary | ICD-10-CM

## 2013-07-29 DIAGNOSIS — E43 Unspecified severe protein-calorie malnutrition: Secondary | ICD-10-CM

## 2013-07-29 DIAGNOSIS — R6521 Severe sepsis with septic shock: Secondary | ICD-10-CM

## 2013-07-29 DIAGNOSIS — I209 Angina pectoris, unspecified: Secondary | ICD-10-CM

## 2013-07-29 DIAGNOSIS — I82402 Acute embolism and thrombosis of unspecified deep veins of left lower extremity: Secondary | ICD-10-CM

## 2013-07-29 LAB — COMPREHENSIVE METABOLIC PANEL
ALK PHOS: 82 U/L (ref 39–117)
ALT: 13 U/L (ref 0–35)
ANION GAP: 17 — AB (ref 5–15)
AST: 14 U/L (ref 0–37)
Albumin: 2.9 g/dL — ABNORMAL LOW (ref 3.5–5.2)
BUN: 13 mg/dL (ref 6–23)
CO2: 23 meq/L (ref 19–32)
Calcium: 8.8 mg/dL (ref 8.4–10.5)
Chloride: 105 mEq/L (ref 96–112)
Creatinine, Ser: 0.49 mg/dL — ABNORMAL LOW (ref 0.50–1.10)
GLUCOSE: 135 mg/dL — AB (ref 70–99)
POTASSIUM: 2.9 meq/L — AB (ref 3.7–5.3)
SODIUM: 145 meq/L (ref 137–147)
Total Bilirubin: 0.4 mg/dL (ref 0.3–1.2)
Total Protein: 5.9 g/dL — ABNORMAL LOW (ref 6.0–8.3)

## 2013-07-29 LAB — CBC WITH DIFFERENTIAL/PLATELET
Basophils Absolute: 0 10*3/uL (ref 0.0–0.1)
Basophils Relative: 0 % (ref 0–1)
EOS PCT: 0 % (ref 0–5)
Eosinophils Absolute: 0 10*3/uL (ref 0.0–0.7)
HCT: 35 % — ABNORMAL LOW (ref 36.0–46.0)
Hemoglobin: 11 g/dL — ABNORMAL LOW (ref 12.0–15.0)
LYMPHS PCT: 11 % — AB (ref 12–46)
Lymphs Abs: 1.5 10*3/uL (ref 0.7–4.0)
MCH: 26.8 pg (ref 26.0–34.0)
MCHC: 31.4 g/dL (ref 30.0–36.0)
MCV: 85.2 fL (ref 78.0–100.0)
MONOS PCT: 3 % (ref 3–12)
Monocytes Absolute: 0.4 10*3/uL (ref 0.1–1.0)
Neutro Abs: 11.5 10*3/uL — ABNORMAL HIGH (ref 1.7–7.7)
Neutrophils Relative %: 86 % — ABNORMAL HIGH (ref 43–77)
PLATELETS: 312 10*3/uL (ref 150–400)
RBC: 4.11 MIL/uL (ref 3.87–5.11)
RDW: 15.6 % — ABNORMAL HIGH (ref 11.5–15.5)
WBC: 13.5 10*3/uL — AB (ref 4.0–10.5)

## 2013-07-29 LAB — CBC
HCT: 31 % — ABNORMAL LOW (ref 36.0–46.0)
HEMOGLOBIN: 9.8 g/dL — AB (ref 12.0–15.0)
MCH: 27.1 pg (ref 26.0–34.0)
MCHC: 31.6 g/dL (ref 30.0–36.0)
MCV: 85.9 fL (ref 78.0–100.0)
Platelets: 294 10*3/uL (ref 150–400)
RBC: 3.61 MIL/uL — ABNORMAL LOW (ref 3.87–5.11)
RDW: 15.6 % — ABNORMAL HIGH (ref 11.5–15.5)
WBC: 8.7 10*3/uL (ref 4.0–10.5)

## 2013-07-29 LAB — URINALYSIS, ROUTINE W REFLEX MICROSCOPIC
Bilirubin Urine: NEGATIVE
GLUCOSE, UA: NEGATIVE mg/dL
Hgb urine dipstick: NEGATIVE
KETONES UR: 15 mg/dL — AB
Nitrite: NEGATIVE
PH: 6 (ref 5.0–8.0)
PROTEIN: NEGATIVE mg/dL
Specific Gravity, Urine: 1.023 (ref 1.005–1.030)
Urobilinogen, UA: 0.2 mg/dL (ref 0.0–1.0)

## 2013-07-29 LAB — URINE MICROSCOPIC-ADD ON

## 2013-07-29 LAB — LIPASE, BLOOD: Lipase: 9 U/L — ABNORMAL LOW (ref 11–59)

## 2013-07-29 LAB — CREATININE, SERUM: Creatinine, Ser: 0.48 mg/dL — ABNORMAL LOW (ref 0.50–1.10)

## 2013-07-29 LAB — MAGNESIUM: MAGNESIUM: 1.8 mg/dL (ref 1.5–2.5)

## 2013-07-29 LAB — I-STAT CG4 LACTIC ACID, ED: Lactic Acid, Venous: 0.86 mmol/L (ref 0.5–2.2)

## 2013-07-29 LAB — TROPONIN I: Troponin I: <0.3 ng/mL (ref <0.30)

## 2013-07-29 MED ORDER — NITROGLYCERIN 0.4 MG SL SUBL: 0.4000 mg | SUBLINGUAL_TABLET | SUBLINGUAL | Status: DC | PRN

## 2013-07-29 MED ORDER — METRONIDAZOLE IN NACL 5-0.79 MG/ML-% IV SOLN: 500.0000 mg | Freq: Once | INTRAVENOUS | Status: DC

## 2013-07-29 MED ORDER — ONDANSETRON HCL 4 MG/2ML IJ SOLN
4.0000 mg | Freq: Four times a day (QID) | INTRAMUSCULAR | Status: DC | PRN
  Administered 2013-07-30 – 2013-08-24 (×4): 4 mg via INTRAVENOUS
  Filled 2013-07-29 (×4): qty 2

## 2013-07-29 MED ORDER — TRAMADOL HCL 50 MG PO TABS: 50.0000 mg | ORAL_TABLET | Freq: Four times a day (QID) | ORAL | Status: DC | PRN

## 2013-07-29 MED ORDER — FLUCONAZOLE IN SODIUM CHLORIDE 200-0.9 MG/100ML-% IV SOLN
200.0000 mg | INTRAVENOUS | Status: DC
  Administered 2013-07-30 – 2013-08-03 (×6): 200 mg via INTRAVENOUS
  Filled 2013-07-29 (×8): qty 100

## 2013-07-29 MED ORDER — MORPHINE SULFATE 4 MG/ML IJ SOLN
4.0000 mg | Freq: Once | INTRAMUSCULAR | Status: AC
  Administered 2013-07-29: 4 mg via INTRAVENOUS
  Filled 2013-07-29: qty 1

## 2013-07-29 MED ORDER — ACETAMINOPHEN 650 MG RE SUPP: 650.0000 mg | Freq: Four times a day (QID) | RECTAL | Status: DC | PRN

## 2013-07-29 MED ORDER — CIPROFLOXACIN IN D5W 400 MG/200ML IV SOLN
400.0000 mg | Freq: Two times a day (BID) | INTRAVENOUS | Status: DC
  Administered 2013-07-29 – 2013-07-30 (×3): 400 mg via INTRAVENOUS
  Filled 2013-07-29 (×4): qty 200

## 2013-07-29 MED ORDER — METOPROLOL TARTRATE 1 MG/ML IV SOLN
2.5000 mg | Freq: Four times a day (QID) | INTRAVENOUS | Status: DC
  Administered 2013-07-30 – 2013-07-31 (×6): 2.5 mg via INTRAVENOUS
  Filled 2013-07-29 (×10): qty 5

## 2013-07-29 MED ORDER — MORPHINE SULFATE 2 MG/ML IJ SOLN
1.0000 mg | INTRAMUSCULAR | Status: DC | PRN
  Administered 2013-07-30: 1 mg via INTRAVENOUS
  Filled 2013-07-29: qty 1

## 2013-07-29 MED ORDER — HYDROMORPHONE HCL PF 1 MG/ML IJ SOLN
1.0000 mg | Freq: Once | INTRAMUSCULAR | Status: AC
  Administered 2013-07-29: 1 mg via INTRAVENOUS
  Filled 2013-07-29: qty 1

## 2013-07-29 MED ORDER — ONDANSETRON HCL 4 MG/2ML IJ SOLN
4.0000 mg | Freq: Once | INTRAMUSCULAR | Status: AC
  Administered 2013-07-29: 4 mg via INTRAVENOUS
  Filled 2013-07-29: qty 2

## 2013-07-29 MED ORDER — PANTOPRAZOLE SODIUM 40 MG IV SOLR
40.0000 mg | INTRAVENOUS | Status: DC
  Administered 2013-07-29 – 2013-08-08 (×11): 40 mg via INTRAVENOUS
  Filled 2013-07-29 (×13): qty 40

## 2013-07-29 MED ORDER — SERTRALINE HCL 100 MG PO TABS
200.0000 mg | ORAL_TABLET | Freq: Every day | ORAL | Status: DC
  Administered 2013-07-30: 200 mg via ORAL
  Filled 2013-07-29 (×3): qty 2

## 2013-07-29 MED ORDER — METOPROLOL SUCCINATE ER 25 MG PO TB24
25.0000 mg | ORAL_TABLET | Freq: Every day | ORAL | Status: DC
  Filled 2013-07-29: qty 1

## 2013-07-29 MED ORDER — IOHEXOL 300 MG/ML  SOLN
100.0000 mL | Freq: Once | INTRAMUSCULAR | Status: AC | PRN
  Administered 2013-07-29: 100 mL via INTRAVENOUS

## 2013-07-29 MED ORDER — KCL IN DEXTROSE-NACL 10-5-0.45 MEQ/L-%-% IV SOLN
INTRAVENOUS | Status: DC
  Administered 2013-07-29 – 2013-07-30 (×2): via INTRAVENOUS
  Filled 2013-07-29 (×4): qty 1000

## 2013-07-29 MED ORDER — POTASSIUM CHLORIDE 10 MEQ/100ML IV SOLN: 10.0000 meq | Freq: Once | INTRAVENOUS | Status: DC

## 2013-07-29 MED ORDER — ZOLPIDEM TARTRATE 5 MG PO TABS: 5.0000 mg | ORAL_TABLET | Freq: Every evening | ORAL | Status: DC | PRN

## 2013-07-29 MED ORDER — IOHEXOL 300 MG/ML  SOLN: 25.0000 mL | INTRAMUSCULAR | Status: AC

## 2013-07-29 MED ORDER — LIDOCAINE HCL 2 % EX GEL
1.0000 "application " | Freq: Once | CUTANEOUS | Status: AC
  Administered 2013-07-29: 1 via TOPICAL
  Filled 2013-07-29: qty 20

## 2013-07-29 MED ORDER — OXYCODONE HCL 5 MG PO TABS: 5.0000 mg | ORAL_TABLET | ORAL | Status: DC | PRN

## 2013-07-29 MED ORDER — CIPROFLOXACIN IN D5W 400 MG/200ML IV SOLN
400.0000 mg | Freq: Once | INTRAVENOUS | Status: AC
  Administered 2013-07-29: 400 mg via INTRAVENOUS
  Filled 2013-07-29: qty 200

## 2013-07-29 MED ORDER — SODIUM CHLORIDE 0.9 % IV BOLUS (SEPSIS)
1000.0000 mL | Freq: Once | INTRAVENOUS | Status: AC
  Administered 2013-07-29: 1000 mL via INTRAVENOUS

## 2013-07-29 MED ORDER — ENOXAPARIN SODIUM 40 MG/0.4ML ~~LOC~~ SOLN
40.0000 mg | SUBCUTANEOUS | Status: DC
  Administered 2013-07-30 – 2013-07-31 (×3): 40 mg via SUBCUTANEOUS
  Filled 2013-07-29 (×4): qty 0.4

## 2013-07-29 MED ORDER — IPRATROPIUM-ALBUTEROL 0.5-2.5 (3) MG/3ML IN SOLN: 3.0000 mL | Freq: Four times a day (QID) | RESPIRATORY_TRACT | Status: DC | PRN

## 2013-07-29 MED ORDER — SODIUM CHLORIDE 0.9 % IJ SOLN
INTRAMUSCULAR | Status: AC
  Filled 2013-07-29: qty 50

## 2013-07-29 MED ORDER — METRONIDAZOLE IN NACL 5-0.79 MG/ML-% IV SOLN
500.0000 mg | Freq: Three times a day (TID) | INTRAVENOUS | Status: DC
  Administered 2013-07-30 – 2013-08-02 (×12): 500 mg via INTRAVENOUS
  Filled 2013-07-29 (×16): qty 100

## 2013-07-29 MED ORDER — ALBUTEROL SULFATE (2.5 MG/3ML) 0.083% IN NEBU: 2.5000 mg | INHALATION_SOLUTION | RESPIRATORY_TRACT | Status: DC | PRN

## 2013-07-29 MED ORDER — ONDANSETRON HCL 4 MG PO TABS: 4.0000 mg | ORAL_TABLET | Freq: Four times a day (QID) | ORAL | Status: DC | PRN

## 2013-07-29 MED ORDER — ACETAMINOPHEN 325 MG PO TABS: 650.0000 mg | ORAL_TABLET | Freq: Four times a day (QID) | ORAL | Status: DC | PRN

## 2013-07-29 NOTE — ED Notes (Signed)
PT has been having abdominal pain since 4/15 after intestinal blockage surgery. PT states last BM on Friday or Saturday.

## 2013-07-29 NOTE — ED Notes (Signed)
Consulting provider asked to have NG tube placed shortly after arriving to floor. Lidocaine jelly sent upstairs w/ patient

## 2013-07-29 NOTE — ED Notes (Signed)
Pt in from home c/o abd pain onset x3 mths with bowel obstruction sx at that time, pt c/o generalized abd pain worse on the LUQ, pt c/o n/v, denies diarrhea, last BM last week, pt A&O x 4, follows commands, speaks in complete

## 2013-07-29 NOTE — ED Notes (Signed)
MD at bedside. Consulting provider

## 2013-07-29 NOTE — ED Provider Notes (Signed)
CSN: [id number]     Arrival date & time 07/29/13  1533 History   First MD Initiated Contact with Patient 07/29/13 1537     Chief Complaint  Patient presents with  . Abdominal Pain     (Consider location/radiation/quality/duration/timing/severity/associated sxs/prior Treatment) HPI Comments: Patient arrives via EMS with diffuse abdominal pain that has been ongoing since April but worse in the past 2 weeks. Patient states she's had multiple surgeries.[facility] hospital for bowel obstruction last one was in April. She was seen at [facility] ER this morning and sent home. Her pain is constant and unrelieved by oxycodone at home. Nothing makes it better or worse. He denies any fever. She has 2 episodes of vomiting today. Her last bowel movement was one week ago. She had a colonoscopy on July 7 that was unremarkable by her report. She denies any chest pain or back pain. She denies any urinary or vaginal symptoms. Denies any fever or vomiting.  The history is provided by the EMS personnel, the patient and a relative. The history is limited by the condition of the patient.    Past Medical History  Diagnosis Date  . Carotid artery disease     a. 60-70% bilat ICA stenosis by dopplers 05/2012.  Marland Kitchen Other and unspecified hyperlipidemia   . CAD (coronary artery disease)     a. Mod dz 2010 initially mgd medically. b. 12/2012 - angina s/p PTCA/DES to mid-circumflex, PTCA/DES to first OM.   Marland Kitchen Hypertension   . Heart murmur   . Depression   . Barrett's esophagus   . Arthritis    Past Surgical History  Procedure Laterality Date  . Appendectomy    . Cholecystectomy    . Colon resection    . 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 Rotator cuff repair Left   . Abdominal hysterectomy    . Colon surgery    . Foot surgery    . Hand surgery     No family history on file. History  Substance Use Topics  . Smoking status: Light Tobacco Smoker    Types:  Cigarettes  . Smokeless tobacco: Never Used  . Alcohol Use: No   OB History   Grav Para Term Preterm Abortions TAB SAB Ect Mult Living                 Review of Systems  Constitutional: Positive for activity change, appetite change and fatigue. Negative for fever.  HENT: Negative for congestion and rhinorrhea.   Respiratory: Negative for cough, chest tightness and shortness of breath.   Cardiovascular: Negative for chest pain.  Gastrointestinal: Positive for nausea, vomiting, abdominal pain and constipation.  Genitourinary: Negative for dysuria, hematuria, vaginal bleeding and vaginal discharge.  Musculoskeletal: Negative for myalgias and neck pain.  Skin: Negative for rash.  Neurological: Negative for dizziness and weakness.  A complete 10 system review of systems was obtained and all systems are negative except as noted in the HPI and PMH.      Allergies  Pregabalin and Sulfonamide derivatives  Home Medications   Prior to Admission medications   Medication Sig Start Date End Date Taking? Authorizing Provider  aspirin EC 81 MG tablet Take 162 mg by mouth daily. Take 2 tablets daily 12/21/12  Yes Dayna N Dunn, PA-C  atorvastatin (LIPITOR) 80 MG tablet Take 80 mg by mouth every evening.    Yes Historical Provider,  MD  clopidogrel (PLAVIX) 75 MG tablet Take 1 tablet (75 mg total) by mouth daily. 12/21/12  Yes Dayna N Dunn, PA-C  dicyclomine (BENTYL) 10 MG capsule Take 10 mg by mouth daily as needed (stomach cramps).  08/20/10  Yes Historical Provider, MD  diphenhydramine-acetaminophen (TYLENOL PM) 25-500 MG TABS Take 1 tablet by mouth at bedtime.    Yes Historical Provider, MD  fish oil-omega-3 fatty acids 1000 MG capsule Take 1,000 mg by mouth daily.    Yes Historical Provider, MD  folic acid (FOLVITE) 678 MCG tablet Take 800 mcg by mouth daily.    Yes Historical Provider, MD  ipratropium-albuterol (DUONEB) 0.5-2.5 (3) MG/3ML SOLN Take 3 mLs by nebulization every 6 (six) hours as  needed (for shortness of breath).   Yes Historical Provider, MD  metoprolol succinate (TOPROL-XL) 25 MG 24 hr tablet Take 25 mg by mouth daily. 12/13/12  Yes Dorothy Spark, MD  ondansetron (ZOFRAN) 4 MG tablet Take 4 mg by mouth every 8 (eight) hours as needed for nausea or vomiting.   Yes Historical Provider, MD  oxyCODONE-acetaminophen (PERCOCET/ROXICET) 5-325 MG per tablet Take 1 tablet by mouth daily. 07/15/13  Yes Historical Provider, MD  pantoprazole (PROTONIX) 40 MG tablet Take 40 mg by mouth daily.   Yes Historical Provider, MD  sertraline (ZOLOFT) 100 MG tablet Take 200 mg by mouth daily.    Yes Historical Provider, MD  traMADol (ULTRAM) 50 MG tablet Take 50 mg by mouth every 6 (six) hours as needed for moderate pain.    Yes Historical Provider, MD  nitroGLYCERIN (NITROSTAT) 0.4 MG SL tablet Place 1 tablet (0.4 mg total) under the tongue every 5 (five) minutes as needed for chest pain. 12/13/12   Dorothy Spark, MD   BP 137/77  Pulse 92  Temp(Src) 98.3 F (36.8 C) (Oral)  Resp 20  Ht 5' (1.524 m)  Wt 154 lb 3.2 oz (69.945 kg)  BMI 30.12 kg/m2  SpO2 94% Physical Exam  Nursing note and vitals reviewed. Constitutional: She is oriented to person, place, and time. She appears well-developed and well-nourished. She appears distressed.  Uncomfortable  HENT:  Head: Normocephalic and atraumatic.  Mouth/Throat: Oropharynx is clear and moist. No oropharyngeal exudate.  Eyes: Conjunctivae and EOM are normal. Pupils are equal, round, and reactive to light.  Neck: Normal range of motion. Neck supple.  No meningismus.  Cardiovascular: Normal rate, normal heart sounds and intact distal pulses.   No murmur heard. Tachycardic  Pulmonary/Chest: Effort normal and breath sounds normal. No respiratory distress.  Abdominal: Soft. There is tenderness. There is guarding. There is no rebound.  Well-healed midline incision with small nontender hernia at inferior edge. Diffuse tenderness with  guarding.  Genitourinary:  No hemorrhoids, no fecal impaction  Musculoskeletal: Normal range of motion. She exhibits no edema and no tenderness.  No CVA tenderness  Neurological: She is alert and oriented to person, place, and time. No cranial nerve deficit. She exhibits normal muscle tone. Coordination normal.  No ataxia on finger to nose bilaterally. No pronator drift. 5/5 strength throughout. CN 2-12 intact. Negative Romberg. Equal grip strength. Sensation intact. Gait is normal.   Skin: Skin is warm.  Psychiatric: She has a normal mood and affect. Her behavior is normal.    ED Course  Procedures (including critical care time) Labs Review Labs Reviewed  CBC WITH DIFFERENTIAL - Abnormal; Notable for the following:    WBC 13.5 (*)    Hemoglobin 11.0 (*)    HCT  35.0 (*)    RDW 15.6 (*)    Neutrophils Relative % 86 (*)    Neutro Abs 11.5 (*)    Lymphocytes Relative 11 (*)    All other components within normal limits  COMPREHENSIVE METABOLIC PANEL - Abnormal; Notable for the following:    Potassium 2.9 (*)    Glucose, Bld 135 (*)    Creatinine, Ser 0.49 (*)    Total Protein 5.9 (*)    Albumin 2.9 (*)    Anion gap 17 (*)    All other components within normal limits  LIPASE, BLOOD - Abnormal; Notable for the following:    Lipase 9 (*)    All other components within normal limits  URINALYSIS, ROUTINE W REFLEX MICROSCOPIC - Abnormal; Notable for the following:    Ketones, ur 15 (*)    Leukocytes, UA SMALL (*)    All other components within normal limits  CBC - Abnormal; Notable for the following:    RBC 3.61 (*)    Hemoglobin 9.8 (*)    HCT 31.0 (*)    RDW 15.6 (*)    All other components within normal limits  CREATININE, SERUM - Abnormal; Notable for the following:    Creatinine, Ser 0.48 (*)    All other components within normal limits  TROPONIN I  URINE MICROSCOPIC-ADD ON  MAGNESIUM  BASIC METABOLIC PANEL  CBC  I-STAT CG4 LACTIC ACID, ED  POC OCCULT BLOOD, ED     Imaging Review Ct Abdomen Pelvis W Contrast  07/29/2013   CLINICAL DATA:  Abdominal pain mostly on LEFT side, nausea, vomiting, past history coronary artery disease, hypertension, smoking  EXAM: CT ABDOMEN AND PELVIS WITH CONTRAST  TECHNIQUE: Multidetector CT imaging of the abdomen and pelvis was performed using the standard protocol following bolus administration of intravenous contrast. Sagittal and coronal MPR images reconstructed from axial data set.  CONTRAST:  14mL OMNIPAQUE IOHEXOL 300 MG/ML SOLN IV. Dilute oral contrast.  COMPARISON:  07/19/2013  FINDINGS: Bibasilar atelectasis greater on LEFT.  Liver, spleen, pancreas, kidneys, and adrenal glands normal appearance.  Small hiatal hernia.  Post cholecystectomy and hysterectomy with nonvisualization of appendix.  Unremarkable bladder and ureters.  Scattered atherosclerotic calcifications including coronary arteries.  Distention of the colon from cecum to proximal descending colon.  Minimal wall thickening and pericolic inflammatory changes at distal transverse colon and splenic flexure.  Abrupt transition from dilated to nondilated colon at the proximal descending colon question related to stricture, focal diverticulitis, or subtle mass lesion.  Descending and sigmoid colon and rectum are decompressed.  Small bowel loops decompressed with a single loop in the central abdomen shows questionable wall thickening.  No definite adenopathy, ascites, or perforation.  Focus of infiltration in the anterior abdominal wall with central fat attenuation question related to prior surgery and/or fat necrosis, area 5.6 x 2.5 x 3.9 cm, unchanged from previous exam.  No acute osseous abnormalities.  IMPRESSION: Dilated proximal colon with abrupt transition to normal caliber at the proximal descending colon compatible colonic obstruction.  Focal inflammatory process at the splenic flexure/proximal descending colon with wall thickening and pericolic infiltration causing  the colonic obstruction, question diverticulitis versus colitis, tumor considered less likely.  Small hiatal hernia.  Questionable wall thickening of a single small bowel loop in the pelvis.   Electronically Signed   By: Lavonia Dana M.D.   On: 07/29/2013 19:07   Dg Abd Acute W/chest  07/29/2013   CLINICAL DATA:  Left lower quadrant pain for 3  months.  EXAM: ACUTE ABDOMEN SERIES (ABDOMEN 2 VIEW & CHEST 1 VIEW)  COMPARISON:  Chest in two views abdomen 07/29/2013 at 8:44 a.m. CT abdomen and pelvis 07/19/2013.  FINDINGS: Single view of the chest again shows a small left pleural effusion. Lungs appear clear. No pneumothorax. Heart size normal.  Two views of the abdomen show no free intraperitoneal air. Air-fluid levels are seen in small bowel. There is small bowel dilatation up to approximately 3.7 cm. There is some gas and stool in the colon.  IMPRESSION: Bowel gas pattern is suggestive of small bowel obstruction. Negative for free air.  Small left pleural effusion.   Electronically Signed   By: Inge Rise M.D.   On: 07/29/2013 17:37     EKG Interpretation   Date/Time:  Friday July 29 2013 15:45:21 EDT Ventricular Rate:  111 PR Interval:  155 QRS Duration: 80 QT Interval:  380 QTC Calculation: 516 R Axis:   71 Text Interpretation:  Sinus tachycardia Probable left atrial enlargement  Prolonged QT interval Rate faster Confirmed by Wyvonnia Dusky  MD, Marchell Froman  [phone]) on 07/29/2013 3:49:19 PM      MDM   Final diagnoses:  Colonic obstruction   Acute on chronic abdominal pain since surgery in April. Diffuse tenderness with guarding. Rule out bowel obstruction.  White count of 13. X-rays show evidence of small bowel obstruction no free air. Potassium 2.9. Place NG tube and CT scan  CT shows colonic obstruction with area of likely diverticulitis and colitis. Start cipro and flagyl. Patient does not want to return to her surgeon at [facility] Dr. Noberto Retort. Dr. Grandville Silos agrees to evaluate patient  and requests a medical admission. NG tube will be placed.  Records obtained from [facility]. Patient had CT scan on 07/19/2013 which showed thickening of colonic wall of the splenic flexure. This is concerning for colitis. Patient underwent surgery on April 10 for lysis of adhesions. She had a colonscopy earlier this week which apparently showed a sigmoid stricture.    Admission d/w Dr. Laren Everts.   Ezequiel Essex, MD 07/30/13 [id number]

## 2013-07-29 NOTE — Consult Note (Signed)
Reason for Consult:Abdominal pain, nausea and vomiting Referring Physician: [name] is an 73 y.o. female.  HPI: She has a history of multiple medical problems including significant coronary artery disease with stents on Plavix. She presented to [facility] with an acute abdomen in April. She underwent an emergent exploration by Dr. Noberto Retort. She had extensive lysis of adhesions. She was on the ventilator for several days but gradually improved. Since discharge from the hospital she has been readmitted 3 times with abdominal pain. [name] abdominal pain has been crampy and intermittent. This worsened over the past week. She did not have a bowel movement for about 7 days. She had a CT scan back at [facility] on June 30. This demonstrated an area of inflammation and narrowing in [name] proximal descending colon. She was treated empirically for colitis with Cipro and Flagyl. She was set up for colonoscopy. She did tolerate the prep and had liquid bowel movements. Dr. Melina Copa attempted to do colonoscopy this past Monday and reportedly could not complete the study. Some biopsies were taken. He felt she had a stricture in [name] colon and she was referred back to Dr. Noberto Retort. He scheduled [name] for barium enema next week. She became acutely worse earlier today with vomiting and increased pain. She was seen at [facility] and discharged. She subsequently came to the [facility] hospital emergency department for further workup including CT scan which shows colonic obstruction at this proximal descending colon location.There is some inflammation there as well. I was asked to see [name] for surgical management. She and [name] family are quite frustrated regarding [name] medical care over the past 2 months.  Past Medical History  Diagnosis Date  . Carotid artery disease     a. 60-70% bilat ICA stenosis by dopplers 05/2012.  Marland Kitchen Other and unspecified hyperlipidemia   . CAD (coronary artery disease)    a. Mod dz 2010 initially mgd medically. b. 12/2012 - angina s/p PTCA/DES to mid-circumflex, PTCA/DES to first OM.   Marland Kitchen Hypertension   . Heart murmur   . Depression   . Barrett's esophagus   . Arthritis     Past Surgical History  Procedure Laterality Date  . Appendectomy    . Cholecystectomy    . Colon resection    . 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 Rotator cuff repair Left   . Abdominal hysterectomy    . Colon surgery    . Foot surgery    . Hand surgery      No family history on file.  Social History:  reports that she has been smoking Cigarettes.  She has been smoking about 0.00 packs per day. She has never used smokeless tobacco. She reports that she does not drink alcohol or use illicit drugs.  Allergies:  Allergies  Allergen Reactions  . Pregabalin Swelling    Tongue swelling  . Sulfonamide Derivatives Swelling    Childhood reaction    Medications:  Prior to Admission:  Prescriptions prior to admission  Medication Sig Dispense Refill  . aspirin EC 81 MG tablet Take 162 mg by mouth daily. Take 2 tablets daily      . atorvastatin (LIPITOR) 80 MG tablet Take 80 mg by mouth every evening.       . clopidogrel (PLAVIX) 75 MG tablet Take 1 tablet (75 mg total) by mouth daily.  90 tablet  2  . dicyclomine (BENTYL) 10 MG capsule Take 10 mg by mouth daily as needed (stomach cramps).       . diphenhydramine-acetaminophen (TYLENOL PM) 25-500 MG TABS Take 1 tablet by mouth at bedtime.       . fish oil-omega-3 fatty acids 1000 MG capsule Take 1,000 mg by mouth daily.       . folic acid (FOLVITE) 161 MCG tablet Take 800 mcg by mouth daily.       Marland Kitchen ipratropium-albuterol (DUONEB) 0.5-2.5 (3) MG/3ML SOLN Take 3 mLs by nebulization every 6 (six) hours as needed (for shortness of breath).      . metoprolol succinate (TOPROL-XL) 25 MG 24 hr tablet Take 25 mg by mouth daily.      . ondansetron (ZOFRAN) 4 MG tablet Take 4 mg by  mouth every 8 (eight) hours as needed for nausea or vomiting.      Marland Kitchen oxyCODONE-acetaminophen (PERCOCET/ROXICET) 5-325 MG per tablet Take 1 tablet by mouth daily.      . pantoprazole (PROTONIX) 40 MG tablet Take 40 mg by mouth daily.      . sertraline (ZOLOFT) 100 MG tablet Take 200 mg by mouth daily.       . traMADol (ULTRAM) 50 MG tablet Take 50 mg by mouth every 6 (six) hours as needed for moderate pain.       . nitroGLYCERIN (NITROSTAT) 0.4 MG SL tablet Place 1 tablet (0.4 mg total) under the tongue every 5 (five) minutes as needed for chest pain.  25 tablet  3    Results for orders placed during the hospital encounter of 07/29/13 (from the past 48 hour(s))  CBC WITH DIFFERENTIAL     Status: Abnormal   Collection Time    07/29/13  4:05 PM      Result Value Ref Range   WBC 13.5 (*) 4.0 - 10.5 K/uL   RBC 4.11  3.87 - 5.11 MIL/uL   Hemoglobin 11.0 (*) 12.0 - 15.0 g/dL   HCT 35.0 (*) 36.0 - 46.0 %   MCV 85.2  78.0 - 100.0 fL   MCH 26.8  26.0 - 34.0 pg   MCHC 31.4  30.0 - 36.0 g/dL   RDW 15.6 (*) 11.5 - 15.5 %   Platelets 312  150 - 400 K/uL   Neutrophils Relative % 86 (*) 43 - 77 %   Neutro Abs 11.5 (*) 1.7 - 7.7 K/uL   Lymphocytes Relative 11 (*) 12 - 46 %   Lymphs Abs 1.5  0.7 - 4.0 K/uL   Monocytes Relative 3  3 - 12 %   Monocytes Absolute 0.4  0.1 - 1.0 K/uL   Eosinophils Relative 0  0 - 5 %   Eosinophils Absolute 0.0  0.0 - 0.7 K/uL   Basophils Relative 0  0 - 1 %   Basophils Absolute 0.0  0.0 - 0.1 K/uL  COMPREHENSIVE METABOLIC PANEL     Status: Abnormal   Collection Time    07/29/13  4:05 PM      Result Value Ref Range   Sodium 145  137 - 147 mEq/L   Potassium 2.9 (*) 3.7 - 5.3 mEq/L   Comment: CRITICAL RESULT CALLED TO, READ BACK BY AND VERIFIED WITH:     MIA WYATT,RN AT 1655 07/29/13 BY K BARR   Chloride 105  96 - 112 mEq/L   CO2 23  19 - 32 mEq/L   Glucose, Bld 135 (*) 70 - 99 mg/dL   BUN  13  6 - 23 mg/dL   Creatinine, Ser 0.49 (*) 0.50 - 1.10 mg/dL   Calcium 8.8   8.4 - 10.5 mg/dL   Total Protein 5.9 (*) 6.0 - 8.3 g/dL   Albumin 2.9 (*) 3.5 - 5.2 g/dL   AST 14  0 - 37 U/L   ALT 13  0 - 35 U/L   Alkaline Phosphatase 82  39 - 117 U/L   Total Bilirubin 0.4  0.3 - 1.2 mg/dL   GFR calc non Af Amer >90  >90 mL/min   GFR calc Af Amer >90  >90 mL/min   Comment: (NOTE)     The eGFR has been calculated using the CKD EPI equation.     This calculation has not been validated in all clinical situations.     eGFR's persistently <90 mL/min signify possible Chronic Kidney     Disease.   Anion gap 17 (*) 5 - 15  LIPASE, BLOOD     Status: Abnormal   Collection Time    07/29/13  4:05 PM      Result Value Ref Range   Lipase 9 (*) 11 - 59 U/L  TROPONIN I     Status: None   Collection Time    07/29/13  4:05 PM      Result Value Ref Range   Troponin I <0.30  <0.30 ng/mL   Comment:            Due to the release kinetics of cTnI,     a negative result within the first hours     of the onset of symptoms does not rule out     myocardial infarction with certainty.     If myocardial infarction is still suspected,     repeat the test at appropriate intervals.  I-STAT CG4 LACTIC ACID, ED     Status: None   Collection Time    07/29/13  4:24 PM      Result Value Ref Range   Lactic Acid, Venous 0.86  0.5 - 2.2 mmol/L  URINALYSIS, ROUTINE W REFLEX MICROSCOPIC     Status: Abnormal   Collection Time    07/29/13  6:04 PM      Result Value Ref Range   Color, Urine YELLOW  YELLOW   APPearance CLEAR  CLEAR   Specific Gravity, Urine 1.023  1.005 - 1.030   pH 6.0  5.0 - 8.0   Glucose, UA NEGATIVE  NEGATIVE mg/dL   Hgb urine dipstick NEGATIVE  NEGATIVE   Bilirubin Urine NEGATIVE  NEGATIVE   Ketones, ur 15 (*) NEGATIVE mg/dL   Protein, ur NEGATIVE  NEGATIVE mg/dL   Urobilinogen, UA 0.2  0.0 - 1.0 mg/dL   Nitrite NEGATIVE  NEGATIVE   Leukocytes, UA SMALL (*) NEGATIVE  URINE MICROSCOPIC-ADD ON     Status: None   Collection Time    07/29/13  6:04 PM      Result Value  Ref Range   Squamous Epithelial / LPF RARE  RARE   WBC, UA 7-10  <3 WBC/hpf   Bacteria, UA RARE  RARE   Urine-Other MANY YEAST      Ct Abdomen Pelvis W Contrast  07/29/2013   CLINICAL DATA:  Abdominal pain mostly on LEFT side, nausea, vomiting, past history coronary artery disease, hypertension, smoking  EXAM: CT ABDOMEN AND PELVIS WITH CONTRAST  TECHNIQUE: Multidetector CT imaging of the abdomen and pelvis was performed using the standard protocol following bolus administration of intravenous contrast.  Sagittal and coronal MPR images reconstructed from axial data set.  CONTRAST:  125mL OMNIPAQUE IOHEXOL 300 MG/ML SOLN IV. Dilute oral contrast.  COMPARISON:  07/19/2013  FINDINGS: Bibasilar atelectasis greater on LEFT.  Liver, spleen, pancreas, kidneys, and adrenal glands normal appearance.  Small hiatal hernia.  Post cholecystectomy and hysterectomy with nonvisualization of appendix.  Unremarkable bladder and ureters.  Scattered atherosclerotic calcifications including coronary arteries.  Distention of the colon from cecum to proximal descending colon.  Minimal wall thickening and pericolic inflammatory changes at distal transverse colon and splenic flexure.  Abrupt transition from dilated to nondilated colon at the proximal descending colon question related to stricture, focal diverticulitis, or subtle mass lesion.  Descending and sigmoid colon and rectum are decompressed.  Small bowel loops decompressed with a single loop in the central abdomen shows questionable wall thickening.  No definite adenopathy, ascites, or perforation.  Focus of infiltration in the anterior abdominal wall with central fat attenuation question related to prior surgery and/or fat necrosis, area 5.6 x 2.5 x 3.9 cm, unchanged from previous exam.  No acute osseous abnormalities.  IMPRESSION: Dilated proximal colon with abrupt transition to normal caliber at the proximal descending colon compatible colonic obstruction.  Focal  inflammatory process at the splenic flexure/proximal descending colon with wall thickening and pericolic infiltration causing the colonic obstruction, question diverticulitis versus colitis, tumor considered less likely.  Small hiatal hernia.  Questionable wall thickening of a single small bowel loop in the pelvis.   Electronically Signed   By: Lavonia Dana M.D.   On: 07/29/2013 19:07   Dg Abd Acute W/chest  07/29/2013   CLINICAL DATA:  Left lower quadrant pain for 3 months.  EXAM: ACUTE ABDOMEN SERIES (ABDOMEN 2 VIEW & CHEST 1 VIEW)  COMPARISON:  Chest in two views abdomen 07/29/2013 at 8:44 a.m. CT abdomen and pelvis 07/19/2013.  FINDINGS: Single view of the chest again shows a small left pleural effusion. Lungs appear clear. No pneumothorax. Heart size normal.  Two views of the abdomen show no free intraperitoneal air. Air-fluid levels are seen in small bowel. There is small bowel dilatation up to approximately 3.7 cm. There is some gas and stool in the colon.  IMPRESSION: Bowel gas pattern is suggestive of small bowel obstruction. Negative for free air.  Small left pleural effusion.   Electronically Signed   By: Inge Rise M.D.   On: 07/29/2013 17:37    Review of Systems  Constitutional: Negative for fever and chills.  HENT: Negative.   Eyes: Negative.   Respiratory: Negative.   Cardiovascular: Negative for chest pain and palpitations.  Gastrointestinal: Positive for nausea, vomiting, abdominal pain and constipation.  Genitourinary: Negative.   Musculoskeletal: Negative.   Skin: Negative.   Neurological: Negative.   Endo/Heme/Allergies: Negative.   Psychiatric/Behavioral: Negative.    Blood pressure 110/49, pulse 98, temperature 97.8 F (36.6 C), temperature source Oral, resp. rate 17, SpO2 93.00%. Physical Exam  Constitutional: She appears well-developed. No distress.  HENT:  Head: Normocephalic.  Right Ear: Tympanic membrane and external ear normal.  Left Ear: Tympanic membrane  and external ear normal.  Nose: No nasal deformity.  Mouth/Throat: Uvula is midline, oropharynx is clear and moist and mucous membranes are normal.  Eyes: EOM are normal. Pupils are equal, round, and reactive to light.  Neck: Normal range of motion. Neck supple. No tracheal deviation present.  Cardiovascular:  Heart rate 105, regular, moderate peripheral edema  Respiratory: Breath sounds normal. No respiratory distress. She has no wheezes.  GI: Soft. She exhibits distension. There is no tenderness. There is no rebound and no guarding.    Healed midline incision with lower midline 2 cm soft area of granulation tissue, bowel sounds are present, no significant tenderness, no peritonitis  Musculoskeletal: Normal range of motion.  Neurological: She exhibits normal muscle tone.  A bit drowsy but answers questions and follows commands  Skin: Skin is warm.  Psychiatric:  See above    Assessment/Plan: Colonic obstruction proximal descending colon. Stricture related to colitis or ischemia versus tumor. Agree with medical admission, nasogastric tube decompression, IV fluids, electrolyte replacement. Hold Plavix. I called Dr.Lininger this evening and discussed [name] recent care, Including [name] previous surgery. He describes [name] abdomen as densely full of adhesions but states that he took all the adhesions down and freed everything up.. It would also be helpful to get [name] records from Dr. Melina Copa, the gastroenterologist as biopsies were done. Additionally, cardiology evaluation may be helpful as the need for further surgery is a real possibility. Further evaluation with Gastrografin enema or colonoscopy here may be necessary. [name] family feels she is a poor surgical candidate. I discussed [name] situation with them in detail and answered their questions. We will follow closely.  Aarron Wierzbicki E 07/29/2013, 8:41 PM

## 2013-07-29 NOTE — H&P (Signed)
[facility]                                                                                    Patient Demographics  [name], is a 73 y.o. female  CSN: [id number]  MRN: [id number]  DOB - 1940-03-27  Admit Date - 07/29/2013  Outpatient Primary MD for the patient is MOON,AMY, NP   With History of -  Past Medical History  Diagnosis Date  . Carotid artery disease     a. 60-70% bilat ICA stenosis by dopplers 05/2012.  Marland Kitchen Other and unspecified hyperlipidemia   . CAD (coronary artery disease)     a. Mod dz 2010 initially mgd medically. b. 12/2012 - angina s/p PTCA/DES to mid-circumflex, PTCA/DES to first OM.   Marland Kitchen Hypertension   . Heart murmur   . Depression   . Barrett's esophagus   . Arthritis       Past Surgical History  Procedure Laterality Date  . Appendectomy    . Cholecystectomy    . Colon resection    . 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 Rotator cuff repair Left   . Abdominal hysterectomy    . Colon surgery    . Foot surgery    . Hand surgery      in for   Chief Complaint  Patient presents with  . Abdominal Pain     HPI  [name]  is a 73 y.o. female, presenting with 2 weeks history of increased abdominal pain with constipation and nausea but no vomiting. No abdominal pain in the left lower quadrant but sometimes involves the whole abdomen. Her history started on April 10 when she had lysis of adhesions and ovarian cystectomy for small bowel obstruction. Her family reports that she had a complication of anesthesia during the surgery. She returned to the hospital few days later with abdominal pain that was diagnosed as colitis and she was discharged home on Levaquin and Flagyl. Her pain improved slightly but she started having abdominal pain again and was admitted to [facility] and was continued on antibiotics with no need for surgical intervention. Gastroenterology  followed the patient and her as well and did not recommend aggressive intervention she was discharged home and started having worsening of her abdominal pain in the last 2 weeks with nausea and no vomiting. She reports severe constipation and had not had any bowel movement for the last 1 week. Patient had a colonoscopy one week ago but they could not cross beyond the splenic flexure according to the husband. CT of the abdomen was done on hospital and showed obstruction at the level of the colon with small bowel distention. Surgery was consulted and I was called to admit.   Review of Systems    In addition to the HPI above,  No Fever-chills, No Headache, No changes with Vision or hearing, No problems swallowing food or Liquids, No Chest pain, Cough or Shortness of Breath, No Blood in stool or Urine, No dysuria, No new skin rashes or bruises, No new joints pains-aches,  No new weakness, tingling, numbness in any extremity, No recent weight gain or loss, No polyuria, polydypsia or polyphagia, No significant Mental Stressors.  A full 10 point Review of Systems was done, except as stated above, all other Review of Systems were negative.   Social History History  Substance Use Topics  . Smoking status: Light Tobacco Smoker    Types: Cigarettes  . Smokeless tobacco: Never Used  . Alcohol Use: No     Family History  asked and reviewed and denies any  Prior to Admission medications   Medication Sig Start Date End Date Taking? Authorizing Provider  aspirin EC 81 MG tablet Take 162 mg by mouth daily. Take 2 tablets daily 12/21/12  Yes Dayna N Dunn, PA-C  atorvastatin (LIPITOR) 80 MG tablet Take 80 mg by mouth every evening.    Yes Historical Provider, MD  clopidogrel (PLAVIX) 75 MG tablet Take 1 tablet (75 mg total) by mouth daily. 12/21/12  Yes Dayna N Dunn, PA-C  dicyclomine (BENTYL) 10 MG capsule Take 10 mg by mouth daily as needed (stomach cramps).  08/20/10  Yes Historical Provider, MD   diphenhydramine-acetaminophen (TYLENOL PM) 25-500 MG TABS Take 1 tablet by mouth at bedtime.    Yes Historical Provider, MD  fish oil-omega-3 fatty acids 1000 MG capsule Take 1,000 mg by mouth daily.    Yes Historical Provider, MD  folic acid (FOLVITE) 332 MCG tablet Take 800 mcg by mouth daily.    Yes Historical Provider, MD  ipratropium-albuterol (DUONEB) 0.5-2.5 (3) MG/3ML SOLN Take 3 mLs by nebulization every 6 (six) hours as needed (for shortness of breath).   Yes Historical Provider, MD  metoprolol succinate (TOPROL-XL) 25 MG 24 hr tablet Take 25 mg by mouth daily. 12/13/12  Yes Dorothy Spark, MD  ondansetron (ZOFRAN) 4 MG tablet Take 4 mg by mouth every 8 (eight) hours as needed for nausea or vomiting.   Yes Historical Provider, MD  oxyCODONE-acetaminophen (PERCOCET/ROXICET) 5-325 MG per tablet Take 1 tablet by mouth daily. 07/15/13  Yes Historical Provider, MD  pantoprazole (PROTONIX) 40 MG tablet Take 40 mg by mouth daily.   Yes Historical Provider, MD  sertraline (ZOLOFT) 100 MG tablet Take 200 mg by mouth daily.    Yes Historical Provider, MD  traMADol (ULTRAM) 50 MG tablet Take 50 mg by mouth every 6 (six) hours as needed for moderate pain.    Yes Historical Provider, MD  nitroGLYCERIN (NITROSTAT) 0.4 MG SL tablet Place 1 tablet (0.4 mg total) under the tongue every 5 (five) minutes as needed for chest pain. 12/13/12   Dorothy Spark, MD    Allergies  Allergen Reactions  . Pregabalin Swelling    Tongue swelling  . Sulfonamide Derivatives Swelling    Childhood reaction    Physical Exam  Vitals  Blood pressure 135/62, pulse 104, temperature 97.8 F (36.6 C), temperature source Oral, resp. rate 18, SpO2 95.00%.   1. General elderly white American female, looks chronically ill  2.  Awake Alert, Oriented X 3.  3. No F.N deficits, ALL C.Nerves Intact, Strength 5/5 all 4 extremities, Sensation intact all 4 extremities, Plantars down going.  4. Ears and Eyes appear  Normal, Conjunctivae clear, PERRLA. Moist Oral Mucosa.  5. Supple Neck, No JVD, No cervical lymphadenopathy appriciated, No Carotid Bruits.  6. Symmetrical Chest wall movement, Good air movement bilaterally, CTAB.  7. RRR, No Gallops, Rubs or Murmurs, No Parasternal Heave.  8. decreased Bowel Sounds, generalized tenderness noted with no surgical  abdomen, old wounds seem to be well healed  9.  No Cyanosis, Normal Skin Turgor, No Skin Rash or Bruise.  10. Good muscle tone,  joints appear normal , no effusions, Normal ROM.  11. No Palpable Lymph Nodes in Neck or Axillae    Data Review  CBC  Recent Labs Lab 07/29/13 1605  WBC 13.5*  HGB 11.0*  HCT 35.0*  PLT 312  MCV 85.2  MCH 26.8  MCHC 31.4  RDW 15.6*  LYMPHSABS 1.5  MONOABS 0.4  EOSABS 0.0  BASOSABS 0.0   ------------------------------------------------------------------------------------------------------------------  Chemistries   Recent Labs Lab 07/29/13 1605  NA 145  K 2.9*  CL 105  CO2 23  GLUCOSE 135*  BUN 13  CREATININE 0.49*  CALCIUM 8.8  AST 14  ALT 13  ALKPHOS 82  BILITOT 0.4   ------------------------------------------------------------------------------------------------------------------ CrCl is unknown because both a height and weight (above a minimum accepted value) are required for this calculation. ------------------------------------------------------------------------------------------------------------------ No results found for this basename: TSH, T4TOTAL, FREET3, T3FREE, THYROIDAB,  in the last 72 hours   Coagulation profile No results found for this basename: INR, PROTIME,  in the last 168 hours ------------------------------------------------------------------------------------------------------------------- No results found for this basename: DDIMER,  in the last 72  hours -------------------------------------------------------------------------------------------------------------------  Cardiac Enzymes  Recent Labs Lab 07/29/13 1605  TROPONINI <0.30   ------------------------------------------------------------------------------------------------------------------ No components found with this basename: POCBNP,    ---------------------------------------------------------------------------------------------------------------  Urinalysis    Component Value Date/Time   COLORURINE YELLOW 07/29/2013 [place] 07/29/2013 1804   LABSPEC 1.023 07/29/2013 1804   PHURINE 6.0 07/29/2013 [place] 07/29/2013 [place] NEGATIVE 07/29/2013 [place] 07/29/2013 1804   KETONESUR 15* 07/29/2013 [place] 07/29/2013 1804   UROBILINOGEN 0.2 07/29/2013 1804   NITRITE NEGATIVE 07/29/2013 1804   LEUKOCYTESUR SMALL* 07/29/2013 1804    ----------------------------------------------------------------------------------------------------------------     Imaging results:   Ct Abdomen Pelvis W Contrast  07/29/2013   CLINICAL DATA:  Abdominal pain mostly on LEFT side, nausea, vomiting, past history coronary artery disease, hypertension, smoking  EXAM: CT ABDOMEN AND PELVIS WITH CONTRAST  TECHNIQUE: Multidetector CT imaging of the abdomen and pelvis was performed using the standard protocol following bolus administration of intravenous contrast. Sagittal and coronal MPR images reconstructed from axial data set.  CONTRAST:  171mL OMNIPAQUE IOHEXOL 300 MG/ML SOLN IV. Dilute oral contrast.  COMPARISON:  07/19/2013  FINDINGS: Bibasilar atelectasis greater on LEFT.  Liver, spleen, pancreas, kidneys, and adrenal glands normal appearance.  Small hiatal hernia.  Post cholecystectomy and hysterectomy with nonvisualization of appendix.  Unremarkable bladder and ureters.  Scattered atherosclerotic calcifications including  coronary arteries.  Distention of the colon from cecum to proximal descending colon.  Minimal wall thickening and pericolic inflammatory changes at distal transverse colon and splenic flexure.  Abrupt transition from dilated to nondilated colon at the proximal descending colon question related to stricture, focal diverticulitis, or subtle mass lesion.  Descending and sigmoid colon and rectum are decompressed.  Small bowel loops decompressed with a single loop in the central abdomen shows questionable wall thickening.  No definite adenopathy, ascites, or perforation.  Focus of infiltration in the anterior abdominal wall with central fat attenuation question related to prior surgery and/or fat necrosis, area 5.6 x 2.5 x 3.9 cm, unchanged from previous exam.  No acute osseous abnormalities.  IMPRESSION: Dilated proximal colon with abrupt transition to normal caliber at the proximal descending colon compatible colonic obstruction.  Focal inflammatory process at the splenic flexure/proximal descending colon with wall thickening and pericolic infiltration causing the colonic obstruction, question diverticulitis versus colitis, tumor considered less likely.  Small hiatal hernia.  Questionable wall thickening of a single small bowel loop in the pelvis.   Electronically Signed   By: Lavonia Dana M.D.   On: 07/29/2013 19:07   Dg Abd Acute W/chest  07/29/2013   CLINICAL DATA:  Left lower quadrant pain for 3 months.  EXAM: ACUTE ABDOMEN SERIES (ABDOMEN 2 VIEW & CHEST 1 VIEW)  COMPARISON:  Chest in two views abdomen 07/29/2013 at 8:44 a.m. CT abdomen and pelvis 07/19/2013.  FINDINGS: Single view of the chest again shows a small left pleural effusion. Lungs appear clear. No pneumothorax. Heart size normal.  Two views of the abdomen show no free intraperitoneal air. Air-fluid levels are seen in small bowel. There is small bowel dilatation up to approximately 3.7 cm. There is some gas and stool in the colon.  IMPRESSION: Bowel  gas pattern is suggestive of small bowel obstruction. Negative for free air.  Small left pleural effusion.   Electronically Signed   By: Inge Rise M.D.   On: 07/29/2013 17:37      Personally reviewed Old Chart from  [facility] admission on 5/29  Assessment & Plan  1.?Diverticulitis/colitis with colonic obstruction and resulting small bowel distention    Patient is status post lysis of adhesions at the [facility] on 04/29/2013 by Dr. Dorathy Daft, also treated for        diverticulitis and colitis with multiple regimens    Surgery consulted    NG tube placed    Cipro/Flagyl    C. diff is pending    Surgery following 2. Coronary artery disease     Aspirin and Plavix on hold     Continue beta blockers 3. Yeast UTI     Continue Diflucan           DVT Prophylaxis Lovenox  AM Labs Ordered, also please review Full Orders  Family Communication: Admission, patients condition and plan of care including tests being ordered have been discussed with the patient and husband and daughters who indicate understanding and agree with the plan and Code Status.  Code Status full  Disposition Plan: Home  Time spent in minutes : 33 minutes  Condition GUARDED   @SIGNATURE @

## 2013-07-30 ENCOUNTER — Encounter (HOSPITAL_COMMUNITY): Payer: Self-pay | Admitting: *Deleted

## 2013-07-30 DIAGNOSIS — I779 Disorder of arteries and arterioles, unspecified: Secondary | ICD-10-CM

## 2013-07-30 LAB — BASIC METABOLIC PANEL
ANION GAP: 15 (ref 5–15)
BUN: 11 mg/dL (ref 6–23)
CO2: 21 mEq/L (ref 19–32)
Calcium: 8.3 mg/dL — ABNORMAL LOW (ref 8.4–10.5)
Chloride: 107 mEq/L (ref 96–112)
Creatinine, Ser: 0.48 mg/dL — ABNORMAL LOW (ref 0.50–1.10)
Glucose, Bld: 112 mg/dL — ABNORMAL HIGH (ref 70–99)
POTASSIUM: 3 meq/L — AB (ref 3.7–5.3)
Sodium: 143 mEq/L (ref 137–147)

## 2013-07-30 LAB — CBC
HCT: 32 % — ABNORMAL LOW (ref 36.0–46.0)
Hemoglobin: 10.1 g/dL — ABNORMAL LOW (ref 12.0–15.0)
MCH: 27.3 pg (ref 26.0–34.0)
MCHC: 31.6 g/dL (ref 30.0–36.0)
MCV: 86.5 fL (ref 78.0–100.0)
PLATELETS: 276 10*3/uL (ref 150–400)
RBC: 3.7 MIL/uL — AB (ref 3.87–5.11)
RDW: 15.9 % — ABNORMAL HIGH (ref 11.5–15.5)
WBC: 10.2 10*3/uL (ref 4.0–10.5)

## 2013-07-30 MED ORDER — HYDROMORPHONE HCL PF 1 MG/ML IJ SOLN
1.0000 mg | INTRAMUSCULAR | Status: DC | PRN
  Administered 2013-07-30 – 2013-07-31 (×4): 1 mg via INTRAVENOUS
  Filled 2013-07-30 (×4): qty 1

## 2013-07-30 MED ORDER — MORPHINE SULFATE 2 MG/ML IJ SOLN
2.0000 mg | INTRAMUSCULAR | Status: DC | PRN
  Administered 2013-07-30 (×6): 2 mg via INTRAVENOUS
  Filled 2013-07-30 (×7): qty 1

## 2013-07-30 MED ORDER — HYDROMORPHONE HCL PF 1 MG/ML IJ SOLN
1.0000 mg | Freq: Once | INTRAMUSCULAR | Status: AC
  Administered 2013-07-30: 1 mg via INTRAVENOUS
  Filled 2013-07-30: qty 1

## 2013-07-30 NOTE — Progress Notes (Signed)
[name] [id number] Admitted to 5W: 07/29/13 21:05 Attending Provider: Merton Border, MD    [name] is a 73 y.o. female patient admitted from ED awake, alert  & orientated  X 3,  Full Code, VSS - Blood pressure 117/60, pulse 100, temperature 98.3 F (36.8 C), temperature source Oral, resp. rate 20, height 5' (1.524 m), weight 69.945 kg (154 lb 3.2 oz), SpO2 94.00%.RA, no c/o shortness of breath, no c/o chest pain, no distress noted.    IV site WDL:  with a transparent dsg that's clean dry and intact.  Allergies:   Allergies  Allergen Reactions  . Pregabalin Swelling    Tongue swelling  . Sulfonamide Derivatives Swelling    Childhood reaction     Past Medical History  Diagnosis Date  . Carotid artery disease     a. 60-70% bilat ICA stenosis by dopplers 05/2012.  Marland Kitchen Other and unspecified hyperlipidemia   . CAD (coronary artery disease)     a. Mod dz 2010 initially mgd medically. b. 12/2012 - angina s/p PTCA/DES to mid-circumflex, PTCA/DES to first OM.   Marland Kitchen Hypertension   . Heart murmur   . Depression   . Barrett's esophagus   . Arthritis     History:  obtained from patient  Pt orientation to unit, room and routine. Information packet given to patient/family.  Admission INP armband ID verified with patient/family, and in place. SR up x 2, fall risk assessment complete with Patient and family verbalizing understanding of risks associated with falls. Pt verbalizes an understanding of how to use the call bell and to call for help before getting out of bed.  Skin, clean-dry- intact without evidence of bruising, or skin tears.   No evidence of skin break down noted on exam.    Will cont to monitor and assist as needed.  Parthenia Ames, RN 07/30/2013 3:04 AM

## 2013-07-30 NOTE — Progress Notes (Signed)
Subjective: Still with abdominal pain after NG placed No flatus  Objective: Vital signs in last 24 hours: Temp:  [97.8 F (36.6 C)-98.6 F (37 C)] 98.6 F (37 C) (07/11 0444) Pulse Rate:  [92-112] 97 (07/11 0444) Resp:  [11-22] 18 (07/11 0444) BP: (110-189)/(49-80) 118/77 mmHg (07/11 0444) SpO2:  [93 %-97 %] 94 % (07/11 0444) Weight:  [154 lb 3.2 oz (69.945 kg)] 154 lb 3.2 oz (69.945 kg) (07/10 2123) Last BM Date: 07/22/13  Intake/Output from previous day: 07/10 0701 - 07/11 0700 In: 557.5 [I.V.:557.5] Out: -  Intake/Output this shift:    Abdomen distended, moderately tender  Lab Results:   Recent Labs  07/29/13 2128 07/30/13 0529  WBC 8.7 10.2  HGB 9.8* 10.1*  HCT 31.0* 32.0*  PLT 294 276   BMET  Recent Labs  07/29/13 1605 07/29/13 2128 07/30/13 0529  NA 145  --  143  K 2.9*  --  3.0*  CL 105  --  107  CO2 23  --  21  GLUCOSE 135*  --  112*  BUN 13  --  11  CREATININE 0.49* 0.48* 0.48*  CALCIUM 8.8  --  8.3*   PT/INR No results found for this basename: LABPROT, INR,  in the last 72 hours ABG No results found for this basename: PHART, PCO2, PO2, HCO3,  in the last 72 hours  Studies/Results: Ct Abdomen Pelvis W Contrast  07/29/2013   CLINICAL DATA:  Abdominal pain mostly on LEFT side, nausea, vomiting, past history coronary artery disease, hypertension, smoking  EXAM: CT ABDOMEN AND PELVIS WITH CONTRAST  TECHNIQUE: Multidetector CT imaging of the abdomen and pelvis was performed using the standard protocol following bolus administration of intravenous contrast. Sagittal and coronal MPR images reconstructed from axial data set.  CONTRAST:  159mL OMNIPAQUE IOHEXOL 300 MG/ML SOLN IV. Dilute oral contrast.  COMPARISON:  07/19/2013  FINDINGS: Bibasilar atelectasis greater on LEFT.  Liver, spleen, pancreas, kidneys, and adrenal glands normal appearance.  Small hiatal hernia.  Post cholecystectomy and hysterectomy with nonvisualization of appendix.   Unremarkable bladder and ureters.  Scattered atherosclerotic calcifications including coronary arteries.  Distention of the colon from cecum to proximal descending colon.  Minimal wall thickening and pericolic inflammatory changes at distal transverse colon and splenic flexure.  Abrupt transition from dilated to nondilated colon at the proximal descending colon question related to stricture, focal diverticulitis, or subtle mass lesion.  Descending and sigmoid colon and rectum are decompressed.  Small bowel loops decompressed with a single loop in the central abdomen shows questionable wall thickening.  No definite adenopathy, ascites, or perforation.  Focus of infiltration in the anterior abdominal wall with central fat attenuation question related to prior surgery and/or fat necrosis, area 5.6 x 2.5 x 3.9 cm, unchanged from previous exam.  No acute osseous abnormalities.  IMPRESSION: Dilated proximal colon with abrupt transition to normal caliber at the proximal descending colon compatible colonic obstruction.  Focal inflammatory process at the splenic flexure/proximal descending colon with wall thickening and pericolic infiltration causing the colonic obstruction, question diverticulitis versus colitis, tumor considered less likely.  Small hiatal hernia.  Questionable wall thickening of a single small bowel loop in the pelvis.   Electronically Signed   By: Lavonia Dana M.D.   On: 07/29/2013 19:07   Dg Abd Acute W/chest  07/29/2013   CLINICAL DATA:  Left lower quadrant pain for 3 months.  EXAM: ACUTE ABDOMEN SERIES (ABDOMEN 2 VIEW & CHEST 1 VIEW)  COMPARISON:  Chest  in two views abdomen 07/29/2013 at 8:44 a.m. CT abdomen and pelvis 07/19/2013.  FINDINGS: Single view of the chest again shows a small left pleural effusion. Lungs appear clear. No pneumothorax. Heart size normal.  Two views of the abdomen show no free intraperitoneal air. Air-fluid levels are seen in small bowel. There is small bowel dilatation up to  approximately 3.7 cm. There is some gas and stool in the colon.  IMPRESSION: Bowel gas pattern is suggestive of small bowel obstruction. Negative for free air.  Small left pleural effusion.   Electronically Signed   By: Inge Rise M.D.   On: 07/29/2013 17:37   Dg Abd Portable 1v  07/30/2013   CLINICAL DATA:  NG placement.  EXAM: PORTABLE ABDOMEN - 1 VIEW  COMPARISON:  07/29/2013  FINDINGS: Enteric tube tip projects over the left upper quadrant consistent with location in the mid stomach. Visualized bowel gas pattern is unremarkable. Residual contrast material in the urinary tract.  IMPRESSION: Enteric tube tip localizes to the region of the mid stomach.   Electronically Signed   By: Lucienne Capers M.D.   On: 07/30/2013 01:59    Anti-infectives: Anti-infectives   Start     Dose/Rate Route Frequency Ordered Stop   07/29/13 2100  ciprofloxacin (CIPRO) IVPB 400 mg     400 mg 200 mL/hr over 60 Minutes Intravenous Every 12 hours 07/29/13 2058     07/29/13 2100  metroNIDAZOLE (FLAGYL) IVPB 500 mg     500 mg 100 mL/hr over 60 Minutes Intravenous Every 8 hours 07/29/13 2058     07/29/13 2100  fluconazole (DIFLUCAN) IVPB 200 mg     200 mg 100 mL/hr over 60 Minutes Intravenous Every 24 hours 07/29/13 2058     07/29/13 1930  ciprofloxacin (CIPRO) IVPB 400 mg     400 mg 200 mL/hr over 60 Minutes Intravenous  Once 07/29/13 1915 07/29/13 2031   07/29/13 1930  metroNIDAZOLE (FLAGYL) IVPB 500 mg  Status:  Discontinued     500 mg 100 mL/hr over 60 Minutes Intravenous  Once 07/29/13 1915 07/29/13 2107      Assessment/Plan:  Large bowel obstruction of uncertain etiology.  Ischemic stricture vs malignancy  I don't think she will get better without surgery.  I explained that this would necessitate a colostomy.  She is thinking about it.  Probable exploration on Monday.  LOS: 1 day    [name] A 07/30/2013

## 2013-07-30 NOTE — Progress Notes (Signed)
[facility] PROGRESS NOTE  NAJA APPERSON NIO:[id number] DOB: Dec 04, 1940 DOA: 07/29/2013 PCP: Laverna Peace, NP  Assessment/Plan: 1. Suspected colitis with colonic obstruction/SBO 1. Surgery consulted with recs of likely surgery early next week 2. Pt has noted some improvement with NG to low wall suction 3. Pt is currently continued on empiric cipro and flagyl 4. As pt has a significant cardiac history (see below), will consult cardiology for a formal cardiac clearance 5. Cont with supportive care 2. CAD 1. Pt is s/p cath and stenting in 12/14, followed by [facility] 2. ASA and plavix currently on hold in anticipation for possible surgery early next week 3. Pt is currently asymptomatic and denies chest pain or sob 4. Per above, will consult Cardiology for formal cardiac pre-op clearance 3. Candidal UTI 1. On IV diflucan 4. DVT prophylaxis 1. Lovenox  Code Status: Full Family Communication: Pt and husband in room Disposition Plan: Pending  Consultants:  General Surgery  Cardiology  Procedures:    Antibiotics:  Ciprofloxacin 7/10>>>  Flagyl 7/10>>>  Diflucan 7/10>>>  HPI/Subjective: No acute events noted overnight. Pt reports some improvement with NG to low wall suction.  Objective: Filed Vitals:   07/29/13 2123 07/30/13 0035 07/30/13 0444 07/30/13 1252  BP: 137/77 117/60 118/77 143/72  Pulse: 92 100 97 93  Temp: 98.3 F (36.8 C)  98.6 F (37 C) 98.4 F (36.9 C)  TempSrc: Oral  Oral Oral  Resp: 20  18 18   Height: 5' (1.524 m)     Weight: 154 lb 3.2 oz (69.945 kg)     SpO2: 94%  94% 90%    Intake/Output Summary (Last 24 hours) at 07/30/13 1629 Last data filed at 07/30/13 0946  Gross per 24 hour  Intake    660 ml  Output     10 ml  Net    650 ml   Filed Weights   07/29/13 2123  Weight: 154 lb 3.2 oz (69.945 kg)    Exam:   General:  Awake, in nad  Cardiovascular: regular, s1, s2  Respiratory: normal resp effort, no  wheezing  Abdomen: soft, nontender, NG in place  Musculoskeletal: perfused, no clubbing   Data Reviewed: Basic Metabolic Panel:  Recent Labs Lab 07/29/13 1605 07/29/13 2128 07/30/13 0529  NA 145  --  143  K 2.9*  --  3.0*  CL 105  --  107  CO2 23  --  21  GLUCOSE 135*  --  112*  BUN 13  --  11  CREATININE 0.49* 0.48* 0.48*  CALCIUM 8.8  --  8.3*  MG  --  1.8  --    Liver Function Tests:  Recent Labs Lab 07/29/13 1605  AST 14  ALT 13  ALKPHOS 82  BILITOT 0.4  PROT 5.9*  ALBUMIN 2.9*    Recent Labs Lab 07/29/13 1605  LIPASE 9*   No results found for this basename: AMMONIA,  in the last 168 hours CBC:  Recent Labs Lab 07/29/13 1605 07/29/13 2128 07/30/13 0529  WBC 13.5* 8.7 10.2  NEUTROABS 11.5*  --   --   HGB 11.0* 9.8* 10.1*  HCT 35.0* 31.0* 32.0*  MCV 85.2 85.9 86.5  PLT 312 294 276   Cardiac Enzymes:  Recent Labs Lab 07/29/13 1605  TROPONINI <0.30   BNP (last 3 results) No results found for this basename: PROBNP,  in the last 8760 hours CBG: No results found for this basename: GLUCAP,  in the last 168 hours  No results  found for this or any previous visit (from the past 240 hour(s)).   Studies: Ct Abdomen Pelvis W Contrast  07/29/2013   CLINICAL DATA:  Abdominal pain mostly on LEFT side, nausea, vomiting, past history coronary artery disease, hypertension, smoking  EXAM: CT ABDOMEN AND PELVIS WITH CONTRAST  TECHNIQUE: Multidetector CT imaging of the abdomen and pelvis was performed using the standard protocol following bolus administration of intravenous contrast. Sagittal and coronal MPR images reconstructed from axial data set.  CONTRAST:  126mL OMNIPAQUE IOHEXOL 300 MG/ML SOLN IV. Dilute oral contrast.  COMPARISON:  07/19/2013  FINDINGS: Bibasilar atelectasis greater on LEFT.  Liver, spleen, pancreas, kidneys, and adrenal glands normal appearance.  Small hiatal hernia.  Post cholecystectomy and hysterectomy with nonvisualization of  appendix.  Unremarkable bladder and ureters.  Scattered atherosclerotic calcifications including coronary arteries.  Distention of the colon from cecum to proximal descending colon.  Minimal wall thickening and pericolic inflammatory changes at distal transverse colon and splenic flexure.  Abrupt transition from dilated to nondilated colon at the proximal descending colon question related to stricture, focal diverticulitis, or subtle mass lesion.  Descending and sigmoid colon and rectum are decompressed.  Small bowel loops decompressed with a single loop in the central abdomen shows questionable wall thickening.  No definite adenopathy, ascites, or perforation.  Focus of infiltration in the anterior abdominal wall with central fat attenuation question related to prior surgery and/or fat necrosis, area 5.6 x 2.5 x 3.9 cm, unchanged from previous exam.  No acute osseous abnormalities.  IMPRESSION: Dilated proximal colon with abrupt transition to normal caliber at the proximal descending colon compatible colonic obstruction.  Focal inflammatory process at the splenic flexure/proximal descending colon with wall thickening and pericolic infiltration causing the colonic obstruction, question diverticulitis versus colitis, tumor considered less likely.  Small hiatal hernia.  Questionable wall thickening of a single small bowel loop in the pelvis.   Electronically Signed   By: Lavonia Dana M.D.   On: 07/29/2013 19:07   Dg Abd Acute W/chest  07/29/2013   CLINICAL DATA:  Left lower quadrant pain for 3 months.  EXAM: ACUTE ABDOMEN SERIES (ABDOMEN 2 VIEW & CHEST 1 VIEW)  COMPARISON:  Chest in two views abdomen 07/29/2013 at 8:44 a.m. CT abdomen and pelvis 07/19/2013.  FINDINGS: Single view of the chest again shows a small left pleural effusion. Lungs appear clear. No pneumothorax. Heart size normal.  Two views of the abdomen show no free intraperitoneal air. Air-fluid levels are seen in small bowel. There is small bowel  dilatation up to approximately 3.7 cm. There is some gas and stool in the colon.  IMPRESSION: Bowel gas pattern is suggestive of small bowel obstruction. Negative for free air.  Small left pleural effusion.   Electronically Signed   By: Inge Rise M.D.   On: 07/29/2013 17:37   Dg Abd Portable 1v  07/30/2013   CLINICAL DATA:  NG placement.  EXAM: PORTABLE ABDOMEN - 1 VIEW  COMPARISON:  07/29/2013  FINDINGS: Enteric tube tip projects over the left upper quadrant consistent with location in the mid stomach. Visualized bowel gas pattern is unremarkable. Residual contrast material in the urinary tract.  IMPRESSION: Enteric tube tip localizes to the region of the mid stomach.   Electronically Signed   By: Lucienne Capers M.D.   On: 07/30/2013 01:59    Scheduled Meds: . ciprofloxacin  400 mg Intravenous Q12H  . enoxaparin (LOVENOX) injection  40 mg Subcutaneous Q24H  . fluconazole (DIFLUCAN) IV  200 mg Intravenous Q24H  . metoprolol  2.5 mg Intravenous 4 times per day  . metronidazole  500 mg Intravenous Q8H  . pantoprazole (PROTONIX) IV  40 mg Intravenous Q24H  . potassium chloride  10 mEq Intravenous Once  . sertraline  200 mg Oral Daily   Continuous Infusions: . dextrose 5 % and 0.45 % NaCl with KCl 10 mEq/L 75 mL/hr at 07/29/13 2312    Principal Problem:   Colonic obstruction  Time spent: 75min  [facility], [facility] Pager [phone]. If 7PM-7AM, please contact night-coverage at [email or web address], password [facility] 07/30/2013, 4:29 PM  LOS: 1 day

## 2013-07-31 ENCOUNTER — Inpatient Hospital Stay (HOSPITAL_COMMUNITY): Payer: Medicare HMO

## 2013-07-31 DIAGNOSIS — F17200 Nicotine dependence, unspecified, uncomplicated: Secondary | ICD-10-CM

## 2013-07-31 DIAGNOSIS — E876 Hypokalemia: Secondary | ICD-10-CM

## 2013-07-31 DIAGNOSIS — E43 Unspecified severe protein-calorie malnutrition: Secondary | ICD-10-CM | POA: Diagnosis present

## 2013-07-31 DIAGNOSIS — I2510 Atherosclerotic heart disease of native coronary artery without angina pectoris: Secondary | ICD-10-CM

## 2013-07-31 LAB — BASIC METABOLIC PANEL
Anion gap: 15 (ref 5–15)
BUN: 5 mg/dL — AB (ref 6–23)
CALCIUM: 8.2 mg/dL — AB (ref 8.4–10.5)
CO2: 24 mEq/L (ref 19–32)
Chloride: 102 mEq/L (ref 96–112)
Creatinine, Ser: 0.44 mg/dL — ABNORMAL LOW (ref 0.50–1.10)
GFR calc Af Amer: >90 mL/min (ref >90)
Glucose, Bld: 111 mg/dL — ABNORMAL HIGH (ref 70–99)
Potassium: 2.8 mEq/L — CL (ref 3.7–5.3)
SODIUM: 141 meq/L (ref 137–147)

## 2013-07-31 LAB — CBC
HCT: 30.4 % — ABNORMAL LOW (ref 36.0–46.0)
Hemoglobin: 9.7 g/dL — ABNORMAL LOW (ref 12.0–15.0)
MCH: 27.3 pg (ref 26.0–34.0)
MCHC: 31.9 g/dL (ref 30.0–36.0)
MCV: 85.6 fL (ref 78.0–100.0)
Platelets: 255 10*3/uL (ref 150–400)
RBC: 3.55 MIL/uL — ABNORMAL LOW (ref 3.87–5.11)
RDW: 15.6 % — ABNORMAL HIGH (ref 11.5–15.5)
WBC: 8.6 10*3/uL (ref 4.0–10.5)

## 2013-07-31 LAB — HEMOGLOBIN A1C
HEMOGLOBIN A1C: 5.5 % (ref <5.7)
Mean Plasma Glucose: 111 mg/dL (ref <117)

## 2013-07-31 LAB — GLUCOSE, CAPILLARY
GLUCOSE-CAPILLARY: 103 mg/dL — AB (ref 70–99)
GLUCOSE-CAPILLARY: 114 mg/dL — AB (ref 70–99)
Glucose-Capillary: 130 mg/dL — ABNORMAL HIGH (ref 70–99)

## 2013-07-31 LAB — SURGICAL PCR SCREEN
MRSA, PCR: NEGATIVE
STAPHYLOCOCCUS AUREUS: NEGATIVE

## 2013-07-31 MED ORDER — POTASSIUM CHLORIDE 10 MEQ/100ML IV SOLN: 10.0000 meq | INTRAVENOUS | Status: DC

## 2013-07-31 MED ORDER — METOPROLOL TARTRATE 1 MG/ML IV SOLN
5.0000 mg | Freq: Four times a day (QID) | INTRAVENOUS | Status: DC
  Administered 2013-07-31 – 2013-08-01 (×4): 5 mg via INTRAVENOUS
  Filled 2013-07-31 (×4): qty 5

## 2013-07-31 MED ORDER — SODIUM CHLORIDE 0.9 % IJ SOLN
10.0000 mL | INTRAMUSCULAR | Status: DC | PRN
  Administered 2013-07-31 – 2013-08-01 (×3): 10 mL

## 2013-07-31 MED ORDER — POTASSIUM CHLORIDE 10 MEQ/100ML IV SOLN
10.0000 meq | INTRAVENOUS | Status: AC
  Administered 2013-07-31 (×5): 10 meq via INTRAVENOUS
  Filled 2013-07-31 (×5): qty 100

## 2013-07-31 MED ORDER — INSULIN ASPART 100 UNIT/ML ~~LOC~~ SOLN
0.0000 [IU] | Freq: Three times a day (TID) | SUBCUTANEOUS | Status: DC
  Administered 2013-08-01: 2 [IU] via SUBCUTANEOUS

## 2013-07-31 MED ORDER — ASPIRIN 300 MG RE SUPP
300.0000 mg | Freq: Every day | RECTAL | Status: DC
  Administered 2013-07-31: 300 mg via RECTAL
  Filled 2013-07-31 (×2): qty 1

## 2013-07-31 MED ORDER — KCL IN DEXTROSE-NACL 10-5-0.45 MEQ/L-%-% IV SOLN
INTRAVENOUS | Status: DC
  Administered 2013-07-31: 11:00:00 via INTRAVENOUS
  Filled 2013-07-31 (×2): qty 1000

## 2013-07-31 MED ORDER — DEXTROSE 5 % IV SOLN
2.0000 g | INTRAVENOUS | Status: AC
  Filled 2013-07-31 (×2): qty 2

## 2013-07-31 MED ORDER — ALUM & MAG HYDROXIDE-SIMETH 200-200-20 MG/5ML PO SUSP
30.0000 mL | Freq: Four times a day (QID) | ORAL | Status: DC | PRN
  Filled 2013-07-31: qty 30

## 2013-07-31 MED ORDER — POLYETHYLENE GLYCOL 3350 17 G PO PACK: 17.0000 g | PACK | Freq: Two times a day (BID) | ORAL | Status: DC | PRN

## 2013-07-31 MED ORDER — MENTHOL 3 MG MT LOZG
1.0000 | LOZENGE | OROMUCOSAL | Status: DC | PRN
  Filled 2013-07-31: qty 9

## 2013-07-31 MED ORDER — TRACE MINERALS CR-CU-F-FE-I-MN-MO-SE-ZN IV SOLN
INTRAVENOUS | Status: AC
  Administered 2013-07-31: 18:00:00 via INTRAVENOUS
  Filled 2013-07-31: qty 1000

## 2013-07-31 MED ORDER — MAGNESIUM SULFATE 40 MG/ML IJ SOLN
2.0000 g | Freq: Once | INTRAMUSCULAR | Status: AC
  Administered 2013-07-31: 2 g via INTRAVENOUS
  Filled 2013-07-31: qty 50

## 2013-07-31 MED ORDER — BLISTEX MEDICATED EX OINT
1.0000 | TOPICAL_OINTMENT | Freq: Two times a day (BID) | CUTANEOUS | Status: DC
Start: 2013-07-31 — End: 2013-08-26
  Administered 2013-07-31 – 2013-08-26 (×48): 1 via TOPICAL
  Filled 2013-07-31 (×3): qty 10

## 2013-07-31 MED ORDER — PHENOL 1.4 % MT LIQD
2.0000 | OROMUCOSAL | Status: DC | PRN
  Administered 2013-07-31: 2 via OROMUCOSAL
  Filled 2013-07-31: qty 177

## 2013-07-31 MED ORDER — BISACODYL 10 MG RE SUPP
10.0000 mg | Freq: Two times a day (BID) | RECTAL | Status: DC | PRN
  Filled 2013-07-31: qty 1

## 2013-07-31 MED ORDER — DEXTROSE 5 % IV SOLN
2.0000 g | INTRAVENOUS | Status: DC
  Administered 2013-07-31 – 2013-08-02 (×3): 2 g via INTRAVENOUS
  Filled 2013-07-31 (×4): qty 2

## 2013-07-31 MED ORDER — HYDROMORPHONE HCL PF 1 MG/ML IJ SOLN
0.5000 mg | INTRAMUSCULAR | Status: DC | PRN
  Administered 2013-07-31: 2 mg via INTRAVENOUS
  Administered 2013-07-31 (×3): 1 mg via INTRAVENOUS
  Administered 2013-08-01 (×2): 2 mg via INTRAVENOUS
  Filled 2013-07-31 (×2): qty 1
  Filled 2013-07-31: qty 2
  Filled 2013-07-31: qty 1
  Filled 2013-07-31 (×2): qty 2

## 2013-07-31 MED ORDER — POTASSIUM CHLORIDE 10 MEQ/100ML IV SOLN: 10.0000 meq | INTRAVENOUS | Status: DC | PRN

## 2013-07-31 MED ORDER — FAT EMULSION 20 % IV EMUL
250.0000 mL | INTRAVENOUS | Status: AC
  Administered 2013-07-31: 250 mL via INTRAVENOUS
  Filled 2013-07-31: qty 250

## 2013-07-31 MED ORDER — MAGIC MOUTHWASH
15.0000 mL | Freq: Four times a day (QID) | ORAL | Status: DC | PRN
  Filled 2013-07-31: qty 15

## 2013-07-31 NOTE — Progress Notes (Signed)
Pharmacy:  Consult for preop antibiotic renal dose/ weight adjustment 73 yo lady for surgery tomorrow.  Weight 70 kg, SrCr 0.48 Cefotetan 2 gm ordered for patient.  Dose is appropriate Thank you,  Excell Seltzer, PharmD

## 2013-07-31 NOTE — Consult Note (Signed)
WOC ostomy consult note Patient seen for pre-operative stoma site selection per Dr. Johney Maine' request. Plan is for OR tomorrow with Dr. Ninfa Linden. Patient with abdominal distention at this time, but is able to sit for marking (not stand) and report wear she wears her undergarments and where the waistband of her slacks sits.  She recently had NGT replaced (for self-removal) and is about to have a CXR for placement verification. Patient's husband is with her at this time. Patient abdomen is viewed in the lying and sitting positions. Site selected is within the narrow rectus muscle and in the LLQ.  There is a significant skin fold located proximally to the mark and several wrinkles located distally. Site marked with surgical marking pen and covered with a thin film transparent dressing.   Site selected is located 6.5cm to the left of and 4.0HK below the umbilicus.  Education provided: Patient and husband given abbreviated teaching regarding an ostomy-specifically that in the event one is created in surgery tomorrow that there are nurses here who are specially trained to assist them in the management of the stoma. Patient does verbalize to the [facility] nurse that she is very hopeful that she does not need a colostomy. Reassurance provided. The husband is given a general book that contains information regarding all of the ostomies; if a colostomy is created they will receive a book specific to the type of stoma along with follow up teaching and support. [place] nursing team will follow in the event of ostomy creation, and will remain available to this patient, the nursing and medical team.  Please re-consult if surgery tomorrow results in a stoma. Thank you, Maudie Flakes, MSN, RN, [place], [place], [place] [phone])

## 2013-07-31 NOTE — Consult Note (Signed)
CARDIOLOGY CONSULT NOTE   Patient ID: [name] MRN: [id number], DOB/AGE: March 31, 1940   Admit date: 07/29/2013 Date of Consult: 07/31/2013   Primary Physician: Laverna Peace, NP Primary Cardiologist: Ena Dawley, H  Problem List  Past Medical History  Diagnosis Date  . Carotid artery disease     a. 60-70% bilat ICA stenosis by dopplers 05/2012.  Marland Kitchen Other and unspecified hyperlipidemia   . CAD (coronary artery disease)     a. Mod dz 2010 initially mgd medically. b. 12/2012 - angina s/p PTCA/DES to mid-circumflex, PTCA/DES to first OM.   Marland Kitchen Hypertension   . Heart murmur   . Depression   . Barrett's esophagus   . Arthritis     Past Surgical History  Procedure Laterality Date  . Appendectomy    . Cholecystectomy    . Colon resection    . 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 Rotator cuff repair Left   . Abdominal hysterectomy    . Colon surgery    . Foot surgery    . Hand surgery       Allergies  Allergies  Allergen Reactions  . Pregabalin Swelling    Tongue swelling  . Sulfonamide Derivatives Swelling    Childhood reaction    HPI   [name] is a 73 y/o F with history of HTN, HLD, carotid disease, CAD s/p PCI in 12/2013 admitted with a colonic obstruction of prox descending colon. Plan is for OR likely Monday. Aspirin and plavix were last taken by the pateint on 7/9. They were discontinued by the primary team on admission.  Cardiology consulted for pre-op evaluation.  [name] had a cardiac cath on 12/21/12. She was noted to have Severe left circumflex and OM disease treated successfully with PCI (PTCA/DES to each), Moderate LAD and RCA stenosis unchanged from the previous study in 2006, and normal LV function EF 55-65%. She has no history of HF. Denies angina, DOE, or claudication. Although she is weak from her operation in April and the sequelae, she reports an overall good functional capacity. She  can perform light house work and estimates she can go up a flight of stairs and walk for over 10 minutes on flat group.    Inpatient Medications  . ciprofloxacin  400 mg Intravenous Q12H  . enoxaparin (LOVENOX) injection  40 mg Subcutaneous Q24H  . fluconazole (DIFLUCAN) IV  200 mg Intravenous Q24H  . metoprolol  2.5 mg Intravenous 4 times per day  . metronidazole  500 mg Intravenous Q8H  . pantoprazole (PROTONIX) IV  40 mg Intravenous Q24H  . potassium chloride  10 mEq Intravenous Once  . sertraline  200 mg Oral Daily    Family History History reviewed. No pertinent family history.   Social History History   Social History  . Marital Status: Married    Spouse Name: N/A    Number of Children: N/A  . Years of Education: N/A   Occupational History  . full time    Social History Main Topics  . Smoking status: Light Tobacco Smoker    Types: Cigarettes  . Smokeless tobacco: Never Used  . Alcohol Use: No  . Drug Use: No  . Sexual Activity: Not Currently   Other Topics Concern  . Not on file   Social History Narrative  . No narrative on file     Review of Systems  General:  No chills, fever, night sweats or weight changes.  Cardiovascular:  No chest pain, dyspnea on exertion, edema, orthopnea, palpitations, paroxysmal nocturnal dyspnea. Dermatological: No rash, lesions/masses Respiratory: No cough, dyspnea Urologic: No hematuria, dysuria Abdominal:   + abdominal pain, nausea. No diarrhea, bright red blood per rectum, melena, or hematemesis Neurologic:  No visual changes, wkns, changes in mental status. All other systems reviewed and are otherwise negative except as noted above.  Physical Exam  Blood pressure 150/73, pulse 97, temperature 98.9 F (37.2 C), temperature source Oral, resp. rate 19, height 5' (1.524 m), weight 69.945 kg (154 lb 3.2 oz), SpO2 94.00%.  General: Pleasant, NAD Psych: Normal affect. Neuro: Alert and oriented X 3. Moves all extremities  spontaneously. HEENT: Normal  Neck: Supple without bruits or JVD. Lungs:  Resp regular and unlabored, CTA. Heart: RRR no s3, s4, or murmurs. Abdomen: Distended. Diffusely tender.   Extremities: No clubbing, cyanosis or edema. DP/PT/Radials 2+ and equal bilaterally.  Labs   Recent Labs  07/29/13 1605  TROPONINI <0.30   Lab Results  Component Value Date   WBC 10.2 07/30/2013   HGB 10.1* 07/30/2013   HCT 32.0* 07/30/2013   MCV 86.5 07/30/2013   PLT 276 07/30/2013    Recent Labs Lab 07/29/13 1605  07/30/13 0529  NA 145  --  143  K 2.9*  --  3.0*  CL 105  --  107  CO2 23  --  21  BUN 13  --  11  CREATININE 0.49*  < > 0.48*  CALCIUM 8.8  --  8.3*  PROT 5.9*  --   --   BILITOT 0.4  --   --   ALKPHOS 82  --   --   ALT 13  --   --   AST 14  --   --   GLUCOSE 135*  --  112*  < > = values in this interval not displayed. Lab Results  Component Value Date   CHOL  Value: 137        ATP III CLASSIFICATION:  <200     mg/dL   Desirable  200-239  mg/dL   Borderline High  >=240    mg/dL   High        05/04/2008   HDL 43 05/04/2008   LDLCALC  Value: 84        Total Cholesterol/HDL:CHD Risk Coronary Heart Disease Risk Table                     Men   Women  1/2 Average Risk   3.4   3.3  Average Risk       5.0   4.4  2 X Average Risk   9.6   7.1  3 X Average Risk  23.4   11.0        Use the calculated Patient Ratio above and the CHD Risk Table to determine the patient's CHD Risk.        ATP III CLASSIFICATION (LDL):  <100     mg/dL   Optimal  100-129  mg/dL   Near or Above                    Optimal  130-159  mg/dL   Borderline  160-189  mg/dL   High  >190     mg/dL   Very High 05/04/2008   TRIG 48 05/04/2008   No results found for this basename: DDIMER  Radiology/Studies  Ct Abdomen Pelvis W Contrast  07/29/2013   CLINICAL DATA:  Abdominal pain mostly on LEFT side, nausea, vomiting, past history coronary artery disease, hypertension, smoking  EXAM: CT ABDOMEN AND PELVIS WITH CONTRAST   TECHNIQUE: Multidetector CT imaging of the abdomen and pelvis was performed using the standard protocol following bolus administration of intravenous contrast. Sagittal and coronal MPR images reconstructed from axial data set.  CONTRAST:  167m OMNIPAQUE IOHEXOL 300 MG/ML SOLN IV. Dilute oral contrast.  COMPARISON:  07/19/2013  FINDINGS: Bibasilar atelectasis greater on LEFT.  Liver, spleen, pancreas, kidneys, and adrenal glands normal appearance.  Small hiatal hernia.  Post cholecystectomy and hysterectomy with nonvisualization of appendix.  Unremarkable bladder and ureters.  Scattered atherosclerotic calcifications including coronary arteries.  Distention of the colon from cecum to proximal descending colon.  Minimal wall thickening and pericolic inflammatory changes at distal transverse colon and splenic flexure.  Abrupt transition from dilated to nondilated colon at the proximal descending colon question related to stricture, focal diverticulitis, or subtle mass lesion.  Descending and sigmoid colon and rectum are decompressed.  Small bowel loops decompressed with a single loop in the central abdomen shows questionable wall thickening.  No definite adenopathy, ascites, or perforation.  Focus of infiltration in the anterior abdominal wall with central fat attenuation question related to prior surgery and/or fat necrosis, area 5.6 x 2.5 x 3.9 cm, unchanged from previous exam.  No acute osseous abnormalities.  IMPRESSION: Dilated proximal colon with abrupt transition to normal caliber at the proximal descending colon compatible colonic obstruction.  Focal inflammatory process at the splenic flexure/proximal descending colon with wall thickening and pericolic infiltration causing the colonic obstruction, question diverticulitis versus colitis, tumor considered less likely.  Small hiatal hernia.  Questionable wall thickening of a single small bowel loop in the pelvis.   Electronically Signed   By: MLavonia DanaM.D.    On: 07/29/2013 19:07   Dg Abd Acute W/chest  07/29/2013   CLINICAL DATA:  Left lower quadrant pain for 3 months.  EXAM: ACUTE ABDOMEN SERIES (ABDOMEN 2 VIEW & CHEST 1 VIEW)  COMPARISON:  Chest in two views abdomen 07/29/2013 at 8:44 a.m. CT abdomen and pelvis 07/19/2013.  FINDINGS: Single view of the chest again shows a small left pleural effusion. Lungs appear clear. No pneumothorax. Heart size normal.  Two views of the abdomen show no free intraperitoneal air. Air-fluid levels are seen in small bowel. There is small bowel dilatation up to approximately 3.7 cm. There is some gas and stool in the colon.  IMPRESSION: Bowel gas pattern is suggestive of small bowel obstruction. Negative for free air.  Small left pleural effusion.   Electronically Signed   By: TInge RiseM.D.   On: 07/29/2013 17:37   Dg Abd Portable 1v  07/30/2013   CLINICAL DATA:  NG placement.  EXAM: PORTABLE ABDOMEN - 1 VIEW  COMPARISON:  07/29/2013  FINDINGS: Enteric tube tip projects over the left upper quadrant consistent with location in the mid stomach. Visualized bowel gas pattern is unremarkable. Residual contrast material in the urinary tract.  IMPRESSION: Enteric tube tip localizes to the region of the mid stomach.   Electronically Signed   By: WLucienne CapersM.D.   On: 07/30/2013 01:59    Cardiac Cath 12/20/12 PROCEDURAL FINDINGS  Hemodynamics:  AO 146/59  LV 149/16  Coronary angiography:  Coronary dominance: right  Left mainstem: Sort segment arising from left cusp, divides into LAD and LCx without obstructive disease  Left anterior descending (LAD): Moderate calcification. First diag has diffuse 50% stenosis. Mid-LAD has 30-40% stenosis. Otherwise wide patency and there is no high-grade stenosis present.  Left circumflex (LCx): normal caliber vessel. The first OM has 80% stenosis. The mid-AV circumflex has 90% eccentirc stenosis. The vessel supplies 2 small OM branches beyond the severe stenosis in the  mid-circumflex.  Right coronary artery (RCA): normal caliber, dominant vessel. The mid-vessel has 50% stenosis. The PDA is patent.  Left ventriculography: Left ventricular systolic function is normal, LVEF is estimated at 55-65%, there is no significant mitral regurgitation  PCI Note: Following the diagnostic procedure, the decision was made to proceed with PCI. The images were compared to the patient's 2006 study, and the LAD and RCA stenosis is stable. However, the LCx stenosis has advanced significantly. Weight-based heparin was given for anticoagulation. Plavix 600 mg was administered. Once a therapeutic ACT was achieved, a 6 [place] XB-LAD 3.0 cm guide catheter was inserted. Attention was first turned to the AV circumflex, as it was the most critical lesion. A cougar coronary guidewire was used to cross the lesion. The lesion was predilated with a 2.5x12 mm balloon. The lesion was then stented with a 2.5x16 mm Promus DES stent. The stent was postdilated with a 2.75 mm noncompliant balloon. Following PCI, there was 0% residual stenosis and TIMI-3 flow. Attention was then turned to the first OM. The cougar wire was redirected down this vessel. The lesion was primarily stented with a 2.25x12 mm Promus DES and post-dilated to 16 atm with a 2.5 mm [place] balloon. Final angiography confirmed an excellent result. The patient tolerated the procedure well. There were no immediate procedural complications. A TR band was used for radial hemostasis. The patient was transferred to the post catheterization recovery area for further monitoring.   PCI Data:  Lesion 1  Vessel - left circumflex/Segment - mid  Percent Stenosis (pre) 95  TIMI-flow 3  Stent 2.5x16 mm Promus DES  Percent Stenosis (post) 0  TIMI-flow (post) 3   Lesion 2  Vessel - OM1/Segment - mid  Percent Stenosis (pre) 80  TIMI-flow 3  Stent 2.25x12 mm Promus DES  Percent Stenosis (post) 0  TIMI-flow (post) 3   Final Conclusions:  1. Severe left  circumflex and OM disease treated successfully with PCI  2. Moderate LAD and RCA stenosis unchanged from the previous study in 2006.  3. Normal LV function     ECG ST, Prolonged QT  ASSESSMENT AND PLAN [name] is a 73 y/o F with history of HTN, HLD, carotid disease, CAD s/p PCI in 12/2013 admitted with a colonic obstruction of prox descending colon. Plan is for OR likely Monday. She is moderate to high risk for a moderate to high risk procedure.   She is certainly at risk for a peri-op MI. Although the operation is unlikely to be able to be performed on plavix, would highly recommend that it be done on aspirin at least. She did have 2 drug eluting stents placed in December and she is at risk for peri-op in stent thrombosis, particularly if off all antiplatelets.She also needs a peri-op BB. Electrolytes should be optimized to reduce the risk of peri-op arrhythmias.   Would recommend - PR ASA - Increase IV metoprolol to 73m IV q6h - Replete K - goal K>4, Mg>2  Signed, FLamar Sprinkles MD     07/31/2013, 2:36 AM

## 2013-07-31 NOTE — Progress Notes (Signed)
PARENTERAL NUTRITION CONSULT NOTE - INITIAL  Pharmacy Consult for TPN Indication: Bowel Obstruction  Allergies  Allergen Reactions  . Pregabalin Swelling    Tongue swelling  . Sulfonamide Derivatives Swelling    Childhood reaction   Patient Measurements: Height: 5' (152.4 cm) Weight: 154 lb 3.2 oz (69.945 kg) IBW/kg (Calculated) : 45.5  Vital Signs: Temp: 98 F (36.7 C) (07/12 0507) Temp src: Oral (07/12 0507) BP: 157/74 mmHg (07/12 0507) Pulse Rate: 94 (07/12 0507) Intake/Output from previous day: 07/11 0701 - 07/12 0700 In: 1691.3 [I.V.:691.3; IV Piggyback:1000] Out: 10 [Emesis/NG output:10] Intake/Output from this shift:  Labs:  Recent Labs  07/29/13 1605 07/29/13 2128 07/30/13 0529  WBC 13.5* 8.7 10.2  HGB 11.0* 9.8* 10.1*  HCT 35.0* 31.0* 32.0*  PLT 312 294 276    Recent Labs  07/29/13 1605 07/29/13 2128 07/30/13 0529  NA 145  --  143  K 2.9*  --  3.0*  CL 105  --  107  CO2 23  --  21  GLUCOSE 135*  --  112*  BUN 13  --  11  CREATININE 0.49* 0.48* 0.48*  CALCIUM 8.8  --  8.3*  MG  --  1.8  --   PROT 5.9*  --   --   ALBUMIN 2.9*  --   --   AST 14  --   --   ALT 13  --   --   ALKPHOS 82  --   --   BILITOT 0.4  --   --    Estimated Creatinine Clearance: 53.9 ml/min (by C-G formula based on Cr of 0.48).   Medical History: Past Medical History  Diagnosis Date  . Carotid artery disease     a. 60-70% bilat ICA stenosis by dopplers 05/2012.  Marland Kitchen Other and unspecified hyperlipidemia   . CAD (coronary artery disease)     a. Mod dz 2010 initially mgd medically. b. 12/2012 - angina s/p PTCA/DES to mid-circumflex, PTCA/DES to first OM.   Marland Kitchen Hypertension   . Heart murmur   . Depression   . Barrett's esophagus   . Arthritis    Insulin Requirements in the past 24 hours:  None  Current Nutrition:  NPO with ice chips  Baseline Labs:  Total Protein 5.9, Albumin 2.9, Alk Phos. 82, AST/ALT 14/13  Assessment: 73 yo female admitted with c/o abdominal  pain associated with N&V.  She has been admitted to [facility] multiple times over the past few months with a similar complaint.  Of note, past surgical history reveals an appendectomy, cholecystectomy, colon resection and an abdominal hysterectomy.  Nutritional Goals:  1200-1400 kCal, 60-70 grams of protein per day   Fluids:  D5%0.45NS with 1meq/KCL at 83ml/hr  ENDO:  No hx. of diabetes, will add SSI when TPN starts.  Lines/Drains:   Peripheral IV (rt. Hand) 7/10 PICC line right (7/12) NG/OG Tube - 7/11  Plan:  1.  Begin TPN with Clinimix 5/15 E at rate of 41ml/hr with plans to titrate as tolerated. 2.  Add SSI coverage to regimen to start with TPN tonight 3.  Will decrease maintenance fluids to 71ml/hr with TPN start 4.  F/U AM labs and adjust  Rober Minion, PharmD., MS Clinical Pharmacist Pager:  [phone] Thank you for allowing pharmacy to be part of this patients care team. 07/31/2013,9:52 AM

## 2013-07-31 NOTE — Progress Notes (Signed)
Peripherally Inserted Central Catheter/Midline Placement  The IV Nurse has discussed with the patient and/or persons authorized to consent for the patient, the purpose of this procedure and the potential benefits and risks involved with this procedure.  The benefits include less needle sticks, lab draws from the catheter and patient may be discharged home with the catheter.  Risks include, but not limited to, infection, bleeding, blood clot (thrombus formation), and puncture of an artery; nerve damage and irregular heat beat.  Alternatives to this procedure were also discussed.  PICC/Midline Placement Documentation  PICC / Midline Double Lumen 07/31/13 PICC Right Brachial 38 cm 0 cm (Active)  Indication for Insertion or Continuance of Line Administration of hyperosmolar/irritating solutions (i.e. TPN, Vancomycin, etc.);Prolonged intravenous therapies 07/31/2013  9:23 AM  Exposed Catheter (cm) 0 cm 07/31/2013  9:23 AM  Site Assessment Clean;Dry;Intact 07/31/2013  9:23 AM  Lumen #1 Status Flushed;Saline locked;Blood return noted 07/31/2013  9:23 AM  Lumen #2 Status Flushed;Saline locked;Blood return noted 07/31/2013  9:23 AM  Dressing Type Transparent 07/31/2013  9:23 AM  Dressing Status Clean;Dry;Intact;Antimicrobial disc in place 07/31/2013  9:23 AM  Line Care Connections checked and tightened 07/31/2013  9:23 AM  Line Adjustment (NICU/IV Team Only) No 07/31/2013  9:23 AM  Dressing Intervention New dressing 07/31/2013  9:23 AM  Dressing Change Due 08/07/13 07/31/2013  9:23 AM       [name] 07/31/2013, 9:23 AM

## 2013-07-31 NOTE — Progress Notes (Signed)
[address] [place], [place] [address] Phone: [phone] FAX: [phone]    ALPA SALVO [id number] [birth year]  CARE TEAM:  PCP: Laverna Peace, NP  Outpatient Care Team: Patient Care Team: Laverna Peace, NP as PCP - General (Internal Medicine)  Inpatient Treatment Team: Treatment Team: Attending Provider: Merton Border, MD; Rounding Team: Md Edison Pace, MD; Technician: Vergia Alcon, NT; Rounding Team: Sonda Primes, MD; Registered Nurse: Bud Face, RN; Consulting Physician: Michae Kava Lbcardiology, MD; Technician: Seabron Spates, NT   Subjective:  Bloated Husband at bedside Walking in room  Objective:  Vital signs:  Filed Vitals:   07/30/13 2056 07/30/13 2118 07/30/13 2343 07/31/13 0507  BP: 130/67  150/73 157/74  Pulse: 104  97 94  Temp: 98.9 F (37.2 C)   98 F (36.7 C)  TempSrc: Oral   Oral  Resp: _0 Height:      Weight:      SpO2: 83% 92% 94% 94%    Last BM Date: 07/23/13  Intake/Output   Yesterday:  07/11 0701 - 07/12 0700 In: 1691.3 [I.V.:691.3; IV Piggyback:1000] Out: 10 [Emesis/NG output:10] This shift:     Bowel function:  Flatus: n  BM: n  Drain: min output  Physical Exam:  General: Pt awake/alert/oriented x4 in mild distress Eyes: PERRL, normal EOM.  Sclera clear.  No icterus Neuro: CN II-XII intact w/o focal sensory/motor deficits. Lymph: No head/neck/groin lymphadenopathy Psych:  No delerium/psychosis/paranoia HENT: Normocephalic, Mucus membranes moist.  No thrush.  NGT in place Neck: Supple, No tracheal deviation Chest: No chest wall pain w good excursion CV:  Pulses intact.  Regular rhythm MS: Normal AROM mjr joints.  No obvious deformity Abdomen: Soft.  Mod distended.  Mod tender at L>R abdomen.  No evidence of peritonitis.  No incarcerated hernias. Ext:  SCDs BLE.  No mjr edema.  No cyanosis Skin: No petechiae / purpura   Problem List:   Principal  Problem:   Colonic obstruction Active Problems:   CAD (coronary artery disease)   Depression   Hypokalemia   Hypomagnesemia   Assessment  [name]  73 y.o. female       Persistent obstruction.  Most likely from LUQ obstructing/inflamed colon  Plan:  -NGT -start TNA for prolonged obstruction & expected prolonged postop ileus -ex lap/ LOA/ colon resection w colostomy tomorrow per Dr Ninfa Linden.  Anticipate ICU/SDU post-op.  Mod risk cardiac:  The anatomy & physiology of the digestive tract was discussed.  The pathophysiology of perforation was discussed.  Differential diagnosis such as perforated ulcer or colon, etc was discussed.   Natural history risks without surgery such as death was discussed.  I recommended abdominal exploration to diagnose & treat the source of the problem.  Laparoscopic & open techniques were discussed.   Risks such as bleeding, infection, abscess, leak, reoperation, bowel resection, possible ostomy, hernia, heart attack, death, and other risks were discussed.   The risks of no intervention will lead to serious problems including death.   I expressed a good likelihood that surgery will address the problem.    Goals of post-operative recovery were discussed as well.  We will work to minimize complications although risks in an emergent setting are high.   Questions were answered.  The patient expressed understanding & wishes to proceed with surgery.    -seen by cards but no orders done - so I did them:   -Low K - replace -Low  Mg - replace -PR ASA -VTE prophylaxis- SCDs, etc -mobilize as tolerated to help recovery.  Get PT/OT to see post op ?SNF   Adin Hector, M.D., F.A.C.S. Gastrointestinal and Minimally Invasive Surgery [facility] [address], [place], [place] [address] [phone] Main / Paging   07/31/2013   Results:   Labs: Results for orders placed during the hospital encounter of 07/29/13  (from the past 48 hour(s))  CBC WITH DIFFERENTIAL     Status: Abnormal   Collection Time    07/29/13  4:05 PM      Result Value Ref Range   WBC 13.5 (*) 4.0 - 10.5 K/uL   RBC 4.11  3.87 - 5.11 MIL/uL   Hemoglobin 11.0 (*) 12.0 - 15.0 g/dL   HCT 35.0 (*) 36.0 - 46.0 %   MCV 85.2  78.0 - 100.0 fL   MCH 26.8  26.0 - 34.0 pg   MCHC 31.4  30.0 - 36.0 g/dL   RDW 15.6 (*) 11.5 - 15.5 %   Platelets 312  150 - 400 K/uL   Neutrophils Relative % 86 (*) 43 - 77 %   Neutro Abs 11.5 (*) 1.7 - 7.7 K/uL   Lymphocytes Relative 11 (*) 12 - 46 %   Lymphs Abs 1.5  0.7 - 4.0 K/uL   Monocytes Relative 3  3 - 12 %   Monocytes Absolute 0.4  0.1 - 1.0 K/uL   Eosinophils Relative 0  0 - 5 %   Eosinophils Absolute 0.0  0.0 - 0.7 K/uL   Basophils Relative 0  0 - 1 %   Basophils Absolute 0.0  0.0 - 0.1 K/uL  COMPREHENSIVE METABOLIC PANEL     Status: Abnormal   Collection Time    07/29/13  4:05 PM      Result Value Ref Range   Sodium 145  137 - 147 mEq/L   Potassium 2.9 (*) 3.7 - 5.3 mEq/L   Comment: CRITICAL RESULT CALLED TO, READ BACK BY AND VERIFIED WITH:     MIA WYATT,RN AT 1655 07/29/13 BY K BARR   Chloride 105  96 - 112 mEq/L   CO2 23  19 - 32 mEq/L   Glucose, Bld 135 (*) 70 - 99 mg/dL   BUN 13  6 - 23 mg/dL   Creatinine, Ser 0.49 (*) 0.50 - 1.10 mg/dL   Calcium 8.8  8.4 - 10.5 mg/dL   Total Protein 5.9 (*) 6.0 - 8.3 g/dL   Albumin 2.9 (*) 3.5 - 5.2 g/dL   AST 14  0 - 37 U/L   ALT 13  0 - 35 U/L   Alkaline Phosphatase 82  39 - 117 U/L   Total Bilirubin 0.4  0.3 - 1.2 mg/dL   GFR calc non Af Amer >90  >90 mL/min   GFR calc Af Amer >90  >90 mL/min   Comment: (NOTE)     The eGFR has been calculated using the CKD EPI equation.     This calculation has not been validated in all clinical situations.     eGFR's persistently <90 mL/min signify possible Chronic Kidney     Disease.   Anion gap 17 (*) 5 - 15  LIPASE, BLOOD     Status: Abnormal   Collection Time    07/29/13  4:05 PM      Result  Value Ref Range   Lipase 9 (*) 11 - 59 U/L  TROPONIN I  Status: None   Collection Time    07/29/13  4:05 PM      Result Value Ref Range   Troponin I <0.30  <0.30 ng/mL   Comment:            Due to the release kinetics of cTnI,     a negative result within the first hours     of the onset of symptoms does not rule out     myocardial infarction with certainty.     If myocardial infarction is still suspected,     repeat the test at appropriate intervals.  I-STAT CG4 LACTIC ACID, ED     Status: None   Collection Time    07/29/13  4:24 PM      Result Value Ref Range   Lactic Acid, Venous 0.86  0.5 - 2.2 mmol/L  URINALYSIS, ROUTINE W REFLEX MICROSCOPIC     Status: Abnormal   Collection Time    07/29/13  6:04 PM      Result Value Ref Range   Color, Urine YELLOW  YELLOW   APPearance CLEAR  CLEAR   Specific Gravity, Urine 1.023  1.005 - 1.030   pH 6.0  5.0 - 8.0   Glucose, UA NEGATIVE  NEGATIVE mg/dL   Hgb urine dipstick NEGATIVE  NEGATIVE   Bilirubin Urine NEGATIVE  NEGATIVE   Ketones, ur 15 (*) NEGATIVE mg/dL   Protein, ur NEGATIVE  NEGATIVE mg/dL   Urobilinogen, UA 0.2  0.0 - 1.0 mg/dL   Nitrite NEGATIVE  NEGATIVE   Leukocytes, UA SMALL (*) NEGATIVE  URINE MICROSCOPIC-ADD ON     Status: None   Collection Time    07/29/13  6:04 PM      Result Value Ref Range   Squamous Epithelial / LPF RARE  RARE   WBC, UA 7-10  <3 WBC/hpf   Bacteria, UA RARE  RARE   Urine-Other MANY YEAST    CBC     Status: Abnormal   Collection Time    07/29/13  9:28 PM      Result Value Ref Range   WBC 8.7  4.0 - 10.5 K/uL   RBC 3.61 (*) 3.87 - 5.11 MIL/uL   Hemoglobin 9.8 (*) 12.0 - 15.0 g/dL   HCT 31.0 (*) 36.0 - 46.0 %   MCV 85.9  78.0 - 100.0 fL   MCH 27.1  26.0 - 34.0 pg   MCHC 31.6  30.0 - 36.0 g/dL   RDW 15.6 (*) 11.5 - 15.5 %   Platelets 294  150 - 400 K/uL  CREATININE, SERUM     Status: Abnormal   Collection Time    07/29/13  9:28 PM      Result Value Ref Range   Creatinine, Ser  0.48 (*) 0.50 - 1.10 mg/dL   GFR calc non Af Amer >90  >90 mL/min   GFR calc Af Amer >90  >90 mL/min   Comment: (NOTE)     The eGFR has been calculated using the CKD EPI equation.     This calculation has not been validated in all clinical situations.     eGFR's persistently <90 mL/min signify possible Chronic Kidney     Disease.  MAGNESIUM     Status: None   Collection Time    07/29/13  9:28 PM      Result Value Ref Range   Magnesium 1.8  1.5 - 2.5 mg/dL  BASIC METABOLIC PANEL     Status: Abnormal  Collection Time    07/30/13  5:29 AM      Result Value Ref Range   Sodium 143  137 - 147 mEq/L   Potassium 3.0 (*) 3.7 - 5.3 mEq/L   Chloride 107  96 - 112 mEq/L   CO2 21  19 - 32 mEq/L   Glucose, Bld 112 (*) 70 - 99 mg/dL   BUN 11  6 - 23 mg/dL   Creatinine, Ser 0.48 (*) 0.50 - 1.10 mg/dL   Calcium 8.3 (*) 8.4 - 10.5 mg/dL   GFR calc non Af Amer >90  >90 mL/min   GFR calc Af Amer >90  >90 mL/min   Comment: (NOTE)     The eGFR has been calculated using the CKD EPI equation.     This calculation has not been validated in all clinical situations.     eGFR's persistently <90 mL/min signify possible Chronic Kidney     Disease.   Anion gap 15  5 - 15  CBC     Status: Abnormal   Collection Time    07/30/13  5:29 AM      Result Value Ref Range   WBC 10.2  4.0 - 10.5 K/uL   RBC 3.70 (*) 3.87 - 5.11 MIL/uL   Hemoglobin 10.1 (*) 12.0 - 15.0 g/dL   HCT 32.0 (*) 36.0 - 46.0 %   MCV 86.5  78.0 - 100.0 fL   MCH 27.3  26.0 - 34.0 pg   MCHC 31.6  30.0 - 36.0 g/dL   RDW 15.9 (*) 11.5 - 15.5 %   Platelets 276  150 - 400 K/uL    Imaging / Studies: Ct Abdomen Pelvis W Contrast  07/29/2013   CLINICAL DATA:  Abdominal pain mostly on LEFT side, nausea, vomiting, past history coronary artery disease, hypertension, smoking  EXAM: CT ABDOMEN AND PELVIS WITH CONTRAST  TECHNIQUE: Multidetector CT imaging of the abdomen and pelvis was performed using the standard protocol following bolus  administration of intravenous contrast. Sagittal and coronal MPR images reconstructed from axial data set.  CONTRAST:  149m OMNIPAQUE IOHEXOL 300 MG/ML SOLN IV. Dilute oral contrast.  COMPARISON:  07/19/2013  FINDINGS: Bibasilar atelectasis greater on LEFT.  Liver, spleen, pancreas, kidneys, and adrenal glands normal appearance.  Small hiatal hernia.  Post cholecystectomy and hysterectomy with nonvisualization of appendix.  Unremarkable bladder and ureters.  Scattered atherosclerotic calcifications including coronary arteries.  Distention of the colon from cecum to proximal descending colon.  Minimal wall thickening and pericolic inflammatory changes at distal transverse colon and splenic flexure.  Abrupt transition from dilated to nondilated colon at the proximal descending colon question related to stricture, focal diverticulitis, or subtle mass lesion.  Descending and sigmoid colon and rectum are decompressed.  Small bowel loops decompressed with a single loop in the central abdomen shows questionable wall thickening.  No definite adenopathy, ascites, or perforation.  Focus of infiltration in the anterior abdominal wall with central fat attenuation question related to prior surgery and/or fat necrosis, area 5.6 x 2.5 x 3.9 cm, unchanged from previous exam.  No acute osseous abnormalities.  IMPRESSION: Dilated proximal colon with abrupt transition to normal caliber at the proximal descending colon compatible colonic obstruction.  Focal inflammatory process at the splenic flexure/proximal descending colon with wall thickening and pericolic infiltration causing the colonic obstruction, question diverticulitis versus colitis, tumor considered less likely.  Small hiatal hernia.  Questionable wall thickening of a single small bowel loop in the pelvis.   Electronically Signed  By: Lavonia Dana M.D.   On: 07/29/2013 19:07   Dg Abd Acute W/chest  07/29/2013   CLINICAL DATA:  Left lower quadrant pain for 3 months.   EXAM: ACUTE ABDOMEN SERIES (ABDOMEN 2 VIEW & CHEST 1 VIEW)  COMPARISON:  Chest in two views abdomen 07/29/2013 at 8:44 a.m. CT abdomen and pelvis 07/19/2013.  FINDINGS: Single view of the chest again shows a small left pleural effusion. Lungs appear clear. No pneumothorax. Heart size normal.  Two views of the abdomen show no free intraperitoneal air. Air-fluid levels are seen in small bowel. There is small bowel dilatation up to approximately 3.7 cm. There is some gas and stool in the colon.  IMPRESSION: Bowel gas pattern is suggestive of small bowel obstruction. Negative for free air.  Small left pleural effusion.   Electronically Signed   By: Inge Rise M.D.   On: 07/29/2013 17:37   Dg Abd Portable 1v  07/30/2013   CLINICAL DATA:  NG placement.  EXAM: PORTABLE ABDOMEN - 1 VIEW  COMPARISON:  07/29/2013  FINDINGS: Enteric tube tip projects over the left upper quadrant consistent with location in the mid stomach. Visualized bowel gas pattern is unremarkable. Residual contrast material in the urinary tract.  IMPRESSION: Enteric tube tip localizes to the region of the mid stomach.   Electronically Signed   By: Lucienne Capers M.D.   On: 07/30/2013 01:59    Medications / Allergies: per chart  Antibiotics: Anti-infectives   Start     Dose/Rate Route Frequency Ordered Stop   07/31/13 0800  cefoTEtan (CEFOTAN) 2 g in dextrose 5 % 50 mL IVPB     2 g 100 mL/hr over 30 Minutes Intravenous On call to O.R. 07/31/13 0741 08/01/13 0559   07/29/13 2100  ciprofloxacin (CIPRO) IVPB 400 mg     400 mg 200 mL/hr over 60 Minutes Intravenous Every 12 hours 07/29/13 2058     07/29/13 2100  metroNIDAZOLE (FLAGYL) IVPB 500 mg     500 mg 100 mL/hr over 60 Minutes Intravenous Every 8 hours 07/29/13 2058     07/29/13 2100  fluconazole (DIFLUCAN) IVPB 200 mg     200 mg 100 mL/hr over 60 Minutes Intravenous Every 24 hours 07/29/13 2058     07/29/13 1930  ciprofloxacin (CIPRO) IVPB 400 mg     400 mg 200 mL/hr over  60 Minutes Intravenous  Once 07/29/13 1915 07/29/13 2031   07/29/13 1930  metroNIDAZOLE (FLAGYL) IVPB 500 mg  Status:  Discontinued     500 mg 100 mL/hr over 60 Minutes Intravenous  Once 07/29/13 1915 07/29/13 2107       Note: This dictation was prepared with voice recognition software technology. In this process, transcriptional errors may occur.  Attempts are made to proofread & provide accurate documentation.  Any errors are unintentional.

## 2013-07-31 NOTE — Progress Notes (Signed)
[facility] PROGRESS NOTE  [name] XIP:[id number] DOB: 11/23/1940 DOA: 07/29/2013 PCP: Laverna Peace, NP  Assessment/Plan: 1. Suspected colitis with colonic obstruction/SBO 1. Surgery consulted with recs of likely surgery early next week 2. Pt has noted some improvement with NG to low wall suction 3. Pt is currently continued on empiric cipro and flagyl 4. Cardiology pre-op eval noted. Recs for ASA, increased beta blocker 5. Cont with supportive care 2. CAD 1. Pt is s/p cath and stenting in 12/14, followed by [facility] Cardiology 2. ASA resumed per Cardiology 3. Pt is currently asymptomatic and denies chest pain or sob 3. Candidal UTI 1. On IV diflucan 4. DVT prophylaxis 1. Lovenox  Code Status: Full Family Communication: Pt in room Disposition Plan: Pending  Consultants:  General Surgery  Cardiology  Procedures:    Antibiotics:  Ciprofloxacin 7/10>>>  Flagyl 7/10>>>  Diflucan 7/10>>>  HPI/Subjective: Continues with generalized abd pain. Denies chest pain or sob  Objective: Filed Vitals:   07/30/13 2056 07/30/13 2118 07/30/13 2343 07/31/13 0507  BP: 130/67  150/73 157/74  Pulse: 104  97 94  Temp: 98.9 F (37.2 C)   98 F (36.7 C)  TempSrc: Oral   Oral  Resp: 18  19 12   Height:      Weight:      SpO2: 83% 92% 94% 94%    Intake/Output Summary (Last 24 hours) at 07/31/13 1256 Last data filed at 07/31/13 0936  Gross per 24 hour  Intake 1188.75 ml  Output      0 ml  Net 1188.75 ml   Filed Weights   07/29/13 2123  Weight: 154 lb 3.2 oz (69.945 kg)    Exam:   General:  Awake, in no acute distress  Cardiovascular: regular, s1, s2  Respiratory: normal resp effort, no crackles  Abdomen: soft, nontender, NG in place to low wall suction  Musculoskeletal: perfused, no clubbing   Data Reviewed: Basic Metabolic Panel:  Recent Labs Lab 07/29/13 1605 07/29/13 2128 07/30/13 0529 07/31/13 1045  NA 145  --  143 141  K 2.9*  --   3.0* 2.8*  CL 105  --  107 102  CO2 23  --  21 24  GLUCOSE 135*  --  112* 111*  BUN 13  --  11 5*  CREATININE 0.49* 0.48* 0.48* 0.44*  CALCIUM 8.8  --  8.3* 8.2*  MG  --  1.8  --   --    Liver Function Tests:  Recent Labs Lab 07/29/13 1605  AST 14  ALT 13  ALKPHOS 82  BILITOT 0.4  PROT 5.9*  ALBUMIN 2.9*    Recent Labs Lab 07/29/13 1605  LIPASE 9*   No results found for this basename: AMMONIA,  in the last 168 hours CBC:  Recent Labs Lab 07/29/13 1605 07/29/13 2128 07/30/13 0529 07/31/13 1045  WBC 13.5* 8.7 10.2 8.6  NEUTROABS 11.5*  --   --   --   HGB 11.0* 9.8* 10.1* 9.7*  HCT 35.0* 31.0* 32.0* 30.4*  MCV 85.2 85.9 86.5 85.6  PLT 312 294 276 255   Cardiac Enzymes:  Recent Labs Lab 07/29/13 1605  TROPONINI <0.30   BNP (last 3 results) No results found for this basename: PROBNP,  in the last 8760 hours CBG:  Recent Labs Lab 07/31/13 1225  GLUCAP 103*    No results found for this or any previous visit (from the past 240 hour(s)).   Studies: Ct Abdomen Pelvis W Contrast  07/29/2013  CLINICAL DATA:  Abdominal pain mostly on LEFT side, nausea, vomiting, past history coronary artery disease, hypertension, smoking  EXAM: CT ABDOMEN AND PELVIS WITH CONTRAST  TECHNIQUE: Multidetector CT imaging of the abdomen and pelvis was performed using the standard protocol following bolus administration of intravenous contrast. Sagittal and coronal MPR images reconstructed from axial data set.  CONTRAST:  150mL OMNIPAQUE IOHEXOL 300 MG/ML SOLN IV. Dilute oral contrast.  COMPARISON:  07/19/2013  FINDINGS: Bibasilar atelectasis greater on LEFT.  Liver, spleen, pancreas, kidneys, and adrenal glands normal appearance.  Small hiatal hernia.  Post cholecystectomy and hysterectomy with nonvisualization of appendix.  Unremarkable bladder and ureters.  Scattered atherosclerotic calcifications including coronary arteries.  Distention of the colon from cecum to proximal descending  colon.  Minimal wall thickening and pericolic inflammatory changes at distal transverse colon and splenic flexure.  Abrupt transition from dilated to nondilated colon at the proximal descending colon question related to stricture, focal diverticulitis, or subtle mass lesion.  Descending and sigmoid colon and rectum are decompressed.  Small bowel loops decompressed with a single loop in the central abdomen shows questionable wall thickening.  No definite adenopathy, ascites, or perforation.  Focus of infiltration in the anterior abdominal wall with central fat attenuation question related to prior surgery and/or fat necrosis, area 5.6 x 2.5 x 3.9 cm, unchanged from previous exam.  No acute osseous abnormalities.  IMPRESSION: Dilated proximal colon with abrupt transition to normal caliber at the proximal descending colon compatible colonic obstruction.  Focal inflammatory process at the splenic flexure/proximal descending colon with wall thickening and pericolic infiltration causing the colonic obstruction, question diverticulitis versus colitis, tumor considered less likely.  Small hiatal hernia.  Questionable wall thickening of a single small bowel loop in the pelvis.   Electronically Signed   By: Lavonia Dana M.D.   On: 07/29/2013 19:07   Dg Abd Acute W/chest  07/29/2013   CLINICAL DATA:  Left lower quadrant pain for 3 months.  EXAM: ACUTE ABDOMEN SERIES (ABDOMEN 2 VIEW & CHEST 1 VIEW)  COMPARISON:  Chest in two views abdomen 07/29/2013 at 8:44 a.m. CT abdomen and pelvis 07/19/2013.  FINDINGS: Single view of the chest again shows a small left pleural effusion. Lungs appear clear. No pneumothorax. Heart size normal.  Two views of the abdomen show no free intraperitoneal air. Air-fluid levels are seen in small bowel. There is small bowel dilatation up to approximately 3.7 cm. There is some gas and stool in the colon.  IMPRESSION: Bowel gas pattern is suggestive of small bowel obstruction. Negative for free air.   Small left pleural effusion.   Electronically Signed   By: Inge Rise M.D.   On: 07/29/2013 17:37   Dg Abd Portable 1v  07/30/2013   CLINICAL DATA:  NG placement.  EXAM: PORTABLE ABDOMEN - 1 VIEW  COMPARISON:  07/29/2013  FINDINGS: Enteric tube tip projects over the left upper quadrant consistent with location in the mid stomach. Visualized bowel gas pattern is unremarkable. Residual contrast material in the urinary tract.  IMPRESSION: Enteric tube tip localizes to the region of the mid stomach.   Electronically Signed   By: Lucienne Capers M.D.   On: 07/30/2013 01:59    Scheduled Meds: . aspirin  300 mg Rectal Daily  . cefoTEtan (CEFOTAN) 2 GM IVPB  2 g Intravenous On Call to OR  . cefTRIAXone (ROCEPHIN)  IV  2 g Intravenous Q24H  . enoxaparin (LOVENOX) injection  40 mg Subcutaneous Q24H  . fluconazole (DIFLUCAN)  IV  200 mg Intravenous Q24H  . insulin aspart  0-9 Units Subcutaneous TID WC  . lip balm  1 application Topical BID  . metoprolol  5 mg Intravenous 4 times per day  . metronidazole  500 mg Intravenous Q8H  . pantoprazole (PROTONIX) IV  40 mg Intravenous Q24H  . potassium chloride  10 mEq Intravenous Once  . potassium chloride  10 mEq Intravenous Q1 Hr x 5   Continuous Infusions: . dextrose 5 % and 0.45 % NaCl with KCl 10 mEq/L 50 mL/hr at 07/31/13 1032  . Marland KitchenTPN (CLINIMIX-E) Adult     And  . fat emulsion      Principal Problem:   Colonic obstruction Active Problems:   CAD (coronary artery disease)   Depression   Hypokalemia   Hypomagnesemia   Protein-calorie malnutrition, moderate  Time spent: 8min  [facility], [facility] Pager [phone]. If 7PM-7AM, please contact night-coverage at [email or web address], password [facility] 07/31/2013, 12:56 PM  LOS: 2 days

## 2013-07-31 NOTE — Progress Notes (Signed)
Utilization Review Completed.Donne Anon T7/12/2013

## 2013-08-01 ENCOUNTER — Encounter (HOSPITAL_COMMUNITY): Payer: Self-pay | Admitting: Certified Registered"

## 2013-08-01 ENCOUNTER — Inpatient Hospital Stay (HOSPITAL_COMMUNITY): Payer: Medicare HMO

## 2013-08-01 ENCOUNTER — Encounter (HOSPITAL_COMMUNITY)
Admission: EM | Disposition: A | Discharge status: Skilled Nursing Facility | Payer: Self-pay | Source: Home / Self Care | Attending: Critical Care Medicine

## 2013-08-01 ENCOUNTER — Inpatient Hospital Stay (HOSPITAL_COMMUNITY): Payer: Medicare HMO | Admitting: Anesthesiology

## 2013-08-01 ENCOUNTER — Encounter (HOSPITAL_COMMUNITY): Payer: Medicare HMO | Admitting: Anesthesiology

## 2013-08-01 DIAGNOSIS — I2584 Coronary atherosclerosis due to calcified coronary lesion: Secondary | ICD-10-CM

## 2013-08-01 DIAGNOSIS — Z9911 Dependence on respirator [ventilator] status: Secondary | ICD-10-CM

## 2013-08-01 DIAGNOSIS — K660 Peritoneal adhesions (postprocedural) (postinfection): Secondary | ICD-10-CM

## 2013-08-01 DIAGNOSIS — R578 Other shock: Secondary | ICD-10-CM

## 2013-08-01 DIAGNOSIS — E440 Moderate protein-calorie malnutrition: Secondary | ICD-10-CM

## 2013-08-01 HISTORY — PX: PARTIAL COLECTOMY: SHX5273

## 2013-08-01 HISTORY — PX: BOWEL RESECTION: SHX1257

## 2013-08-01 HISTORY — PX: LAPAROTOMY: SHX154

## 2013-08-01 HISTORY — PX: COLOSTOMY: SHX63

## 2013-08-01 LAB — URINALYSIS, ROUTINE W REFLEX MICROSCOPIC
Bilirubin Urine: NEGATIVE
GLUCOSE, UA: NEGATIVE mg/dL
HGB URINE DIPSTICK: NEGATIVE
Ketones, ur: 15 mg/dL — AB
Nitrite: NEGATIVE
PH: 6 (ref 5.0–8.0)
Protein, ur: 30 mg/dL — AB
Specific Gravity, Urine: 1.019 (ref 1.005–1.030)
Urobilinogen, UA: 0.2 mg/dL (ref 0.0–1.0)

## 2013-08-01 LAB — DIFFERENTIAL
BASOS ABS: 0 10*3/uL (ref 0.0–0.1)
Basophils Relative: 0 % (ref 0–1)
EOS PCT: 1 % (ref 0–5)
Eosinophils Absolute: 0.1 10*3/uL (ref 0.0–0.7)
LYMPHS PCT: 13 % (ref 12–46)
Lymphs Abs: 1.1 10*3/uL (ref 0.7–4.0)
Monocytes Absolute: 0.5 10*3/uL (ref 0.1–1.0)
Monocytes Relative: 6 % (ref 3–12)
NEUTROS ABS: 7.2 10*3/uL (ref 1.7–7.7)
NEUTROS PCT: 80 % — AB (ref 43–77)

## 2013-08-01 LAB — BLOOD GAS, ARTERIAL
ACID-BASE DEFICIT: 3 mmol/L — AB (ref 0.0–2.0)
BICARBONATE: 20.9 meq/L (ref 20.0–24.0)
Drawn by: 307971
FIO2: 0.4 %
MECHVT: 360 mL
O2 SAT: 98.2 %
PCO2 ART: 33.8 mmHg — AB (ref 35.0–45.0)
PEEP: 5 cmH2O
Patient temperature: 98.6
RATE: 16 resp/min
TCO2: 21.9 mmol/L (ref 0–100)
pH, Arterial: 7.408 (ref 7.350–7.450)
pO2, Arterial: 86.6 mmHg (ref 80.0–100.0)

## 2013-08-01 LAB — CBC
HCT: 29.3 % — ABNORMAL LOW (ref 36.0–46.0)
HCT: 32.7 % — ABNORMAL LOW (ref 36.0–46.0)
Hemoglobin: 9.4 g/dL — ABNORMAL LOW (ref 12.0–15.0)
Hemoglobin: 10.8 g/dL — ABNORMAL LOW (ref 12.0–15.0)
MCH: 27.6 pg (ref 26.0–34.0)
MCH: 29 pg (ref 26.0–34.0)
MCHC: 32.1 g/dL (ref 30.0–36.0)
MCHC: 33 g/dL (ref 30.0–36.0)
MCV: 85.9 fL (ref 78.0–100.0)
MCV: 87.9 fL (ref 78.0–100.0)
PLATELETS: 255 10*3/uL (ref 150–400)
Platelets: 224 10*3/uL (ref 150–400)
RBC: 3.41 MIL/uL — ABNORMAL LOW (ref 3.87–5.11)
RBC: 3.72 MIL/uL — ABNORMAL LOW (ref 3.87–5.11)
RDW: 15.8 % — AB (ref 11.5–15.5)
RDW: 14.6 % (ref 11.5–15.5)
WBC: 8.9 10*3/uL (ref 4.0–10.5)
WBC: 5 10*3/uL (ref 4.0–10.5)

## 2013-08-01 LAB — COMPREHENSIVE METABOLIC PANEL
ALBUMIN: 2.3 g/dL — AB (ref 3.5–5.2)
ALT: 10 U/L (ref 0–35)
AST: 12 U/L (ref 0–37)
Alkaline Phosphatase: 72 U/L (ref 39–117)
Anion gap: 12 (ref 5–15)
BUN: 5 mg/dL — ABNORMAL LOW (ref 6–23)
CO2: 25 mEq/L (ref 19–32)
CREATININE: 0.42 mg/dL — AB (ref 0.50–1.10)
Calcium: 8.1 mg/dL — ABNORMAL LOW (ref 8.4–10.5)
Chloride: 104 mEq/L (ref 96–112)
GFR calc Af Amer: >90 mL/min (ref >90)
GFR calc non Af Amer: >90 mL/min (ref >90)
Glucose, Bld: 147 mg/dL — ABNORMAL HIGH (ref 70–99)
POTASSIUM: 3 meq/L — AB (ref 3.7–5.3)
Sodium: 141 mEq/L (ref 137–147)
TOTAL PROTEIN: 5.1 g/dL — AB (ref 6.0–8.3)
Total Bilirubin: 0.4 mg/dL (ref 0.3–1.2)

## 2013-08-01 LAB — POCT I-STAT 4, (NA,K, GLUC, HGB,HCT)
GLUCOSE: 164 mg/dL — AB (ref 70–99)
HCT: 20 % — ABNORMAL LOW (ref 36.0–46.0)
HEMOGLOBIN: 6.8 g/dL — AB (ref 12.0–15.0)
POTASSIUM: 3.6 meq/L — AB (ref 3.7–5.3)
Sodium: 139 mEq/L (ref 137–147)

## 2013-08-01 LAB — BASIC METABOLIC PANEL
Anion gap: 15 (ref 5–15)
BUN: 7 mg/dL (ref 6–23)
CO2: 20 mEq/L (ref 19–32)
Calcium: 7.8 mg/dL — ABNORMAL LOW (ref 8.4–10.5)
Chloride: 103 mEq/L (ref 96–112)
Creatinine, Ser: 0.58 mg/dL (ref 0.50–1.10)
GFR calc Af Amer: >90 mL/min (ref >90)
GFR, EST NON AFRICAN AMERICAN: 89 mL/min — AB (ref >90)
Glucose, Bld: 193 mg/dL — ABNORMAL HIGH (ref 70–99)
Potassium: 3.8 mEq/L (ref 3.7–5.3)
Sodium: 138 mEq/L (ref 137–147)

## 2013-08-01 LAB — PHOSPHORUS: PHOSPHORUS: 2.3 mg/dL (ref 2.3–4.6)

## 2013-08-01 LAB — GLUCOSE, CAPILLARY
GLUCOSE-CAPILLARY: 150 mg/dL — AB (ref 70–99)
Glucose-Capillary: 164 mg/dL — ABNORMAL HIGH (ref 70–99)
Glucose-Capillary: 104 mg/dL — ABNORMAL HIGH (ref 70–99)
Glucose-Capillary: 144 mg/dL — ABNORMAL HIGH (ref 70–99)
Glucose-Capillary: 190 mg/dL — ABNORMAL HIGH (ref 70–99)

## 2013-08-01 LAB — MRSA PCR SCREENING: MRSA BY PCR: NEGATIVE

## 2013-08-01 LAB — URINE MICROSCOPIC-ADD ON

## 2013-08-01 LAB — PREALBUMIN: PREALBUMIN: 9 mg/dL — AB (ref 17.0–34.0)

## 2013-08-01 LAB — PROTIME-INR
INR: 1.25 (ref 0.00–1.49)
Prothrombin Time: 15.7 seconds — ABNORMAL HIGH (ref 11.6–15.2)

## 2013-08-01 LAB — ABO/RH: ABO/RH(D): B POS

## 2013-08-01 LAB — MAGNESIUM: Magnesium: 2 mg/dL (ref 1.5–2.5)

## 2013-08-01 LAB — TRIGLYCERIDES: Triglycerides: 96 mg/dL (ref <150)

## 2013-08-01 LAB — PREPARE RBC (CROSSMATCH)

## 2013-08-01 SURGERY — LAPAROTOMY, EXPLORATORY
Anesthesia: General | Site: Abdomen

## 2013-08-01 MED ORDER — FENTANYL CITRATE 0.05 MG/ML IJ SOLN
INTRAMUSCULAR | Status: AC
  Filled 2013-08-01: qty 5

## 2013-08-01 MED ORDER — LIDOCAINE HCL (CARDIAC) 20 MG/ML IV SOLN
INTRAVENOUS | Status: AC
  Filled 2013-08-01: qty 5

## 2013-08-01 MED ORDER — FENTANYL CITRATE 0.05 MG/ML IJ SOLN: 100.0000 ug | INTRAMUSCULAR | Status: DC | PRN

## 2013-08-01 MED ORDER — METOCLOPRAMIDE HCL 5 MG/ML IJ SOLN: 10.0000 mg | Freq: Once | INTRAMUSCULAR | Status: DC | PRN

## 2013-08-01 MED ORDER — KCL IN DEXTROSE-NACL 30-5-0.45 MEQ/L-%-% IV SOLN
INTRAVENOUS | Status: DC
  Administered 2013-08-01: 10:00:00 via INTRAVENOUS
  Filled 2013-08-01 (×2): qty 1000

## 2013-08-01 MED ORDER — LIDOCAINE HCL (CARDIAC) 20 MG/ML IV SOLN
INTRAVENOUS | Status: DC | PRN
  Administered 2013-08-01: 60 mg via INTRAVENOUS

## 2013-08-01 MED ORDER — GLYCOPYRROLATE 0.2 MG/ML IJ SOLN
INTRAMUSCULAR | Status: AC
  Filled 2013-08-01: qty 3

## 2013-08-01 MED ORDER — HYDROMORPHONE HCL PF 1 MG/ML IJ SOLN
0.2500 mg | INTRAMUSCULAR | Status: DC | PRN
Start: 2013-08-01 — End: 2013-08-01

## 2013-08-01 MED ORDER — DIPHENHYDRAMINE HCL 50 MG/ML IJ SOLN
50.0000 mg | Freq: Four times a day (QID) | INTRAMUSCULAR | Status: DC | PRN
  Administered 2013-08-01 – 2013-08-03 (×4): 50 mg via INTRAVENOUS
  Filled 2013-08-01 (×4): qty 1

## 2013-08-01 MED ORDER — IPRATROPIUM-ALBUTEROL 0.5-2.5 (3) MG/3ML IN SOLN: 3.0000 mL | RESPIRATORY_TRACT | Status: DC | PRN

## 2013-08-01 MED ORDER — METRONIDAZOLE IN NACL 5-0.79 MG/ML-% IV SOLN
500.0000 mg | INTRAVENOUS | Status: AC
  Administered 2013-08-01: 500 mg via INTRAVENOUS
  Filled 2013-08-01: qty 100

## 2013-08-01 MED ORDER — OXYCODONE HCL 5 MG/5ML PO SOLN: 5.0000 mg | Freq: Once | ORAL | Status: DC | PRN

## 2013-08-01 MED ORDER — PROPOFOL 10 MG/ML IV BOLUS
INTRAVENOUS | Status: DC | PRN
  Administered 2013-08-01: 80 mg via INTRAVENOUS

## 2013-08-01 MED ORDER — POTASSIUM CHLORIDE 10 MEQ/100ML IV SOLN
10.0000 meq | INTRAVENOUS | Status: AC
  Administered 2013-08-01 (×3): 10 meq via INTRAVENOUS
  Filled 2013-08-01 (×5): qty 100

## 2013-08-01 MED ORDER — 0.9 % SODIUM CHLORIDE (POUR BTL) OPTIME
TOPICAL | Status: DC | PRN
  Administered 2013-08-01 (×4): 1000 mL

## 2013-08-01 MED ORDER — ARTIFICIAL TEARS OP OINT
TOPICAL_OINTMENT | OPHTHALMIC | Status: AC
  Filled 2013-08-01: qty 3.5

## 2013-08-01 MED ORDER — PHENYLEPHRINE 40 MCG/ML (10ML) SYRINGE FOR IV PUSH (FOR BLOOD PRESSURE SUPPORT)
PREFILLED_SYRINGE | INTRAVENOUS | Status: AC
  Filled 2013-08-01: qty 10

## 2013-08-01 MED ORDER — LACTATED RINGERS IV SOLN
INTRAVENOUS | Status: DC | PRN
  Administered 2013-08-01 (×3): via INTRAVENOUS

## 2013-08-01 MED ORDER — OXYCODONE HCL 5 MG PO TABS: 5.0000 mg | ORAL_TABLET | Freq: Once | ORAL | Status: DC | PRN

## 2013-08-01 MED ORDER — NEOSTIGMINE METHYLSULFATE 10 MG/10ML IV SOLN
INTRAVENOUS | Status: AC
  Filled 2013-08-01: qty 1

## 2013-08-01 MED ORDER — ARTIFICIAL TEARS OP OINT
TOPICAL_OINTMENT | OPHTHALMIC | Status: DC | PRN
  Administered 2013-08-01: 1 via OPHTHALMIC

## 2013-08-01 MED ORDER — DIPHENHYDRAMINE HCL 50 MG/ML IJ SOLN
INTRAMUSCULAR | Status: DC | PRN
  Administered 2013-08-01 (×2): 6.25 mg via INTRAVENOUS

## 2013-08-01 MED ORDER — CHLORHEXIDINE GLUCONATE 0.12 % MT SOLN
15.0000 mL | Freq: Two times a day (BID) | OROMUCOSAL | Status: DC
  Administered 2013-08-01 – 2013-08-26 (×46): 15 mL via OROMUCOSAL
  Filled 2013-08-01 (×50): qty 15

## 2013-08-01 MED ORDER — ROCURONIUM BROMIDE 100 MG/10ML IV SOLN
INTRAVENOUS | Status: DC | PRN
  Administered 2013-08-01: 50 mg via INTRAVENOUS
  Administered 2013-08-01: 10 mg via INTRAVENOUS

## 2013-08-01 MED ORDER — STERILE WATER FOR INJECTION IJ SOLN
INTRAMUSCULAR | Status: AC
  Filled 2013-08-01: qty 10

## 2013-08-01 MED ORDER — SODIUM CHLORIDE 0.9 % IV SOLN
0.0000 ug/h | INTRAVENOUS | Status: DC
  Administered 2013-08-01: 50 ug/h via INTRAVENOUS
  Administered 2013-08-02: 100 ug/h via INTRAVENOUS
  Administered 2013-08-04: 150 ug/h via INTRAVENOUS
  Administered 2013-08-05: 50 ug/h via INTRAVENOUS
  Administered 2013-08-06: 100 ug/h via INTRAVENOUS
  Administered 2013-08-07: 150 ug/h via INTRAVENOUS
  Administered 2013-08-07: 200 ug/h via INTRAVENOUS
  Administered 2013-08-08: 150 ug/h via INTRAVENOUS
  Filled 2013-08-01 (×9): qty 50

## 2013-08-01 MED ORDER — GLYCOPYRROLATE 0.2 MG/ML IJ SOLN
INTRAMUSCULAR | Status: DC | PRN
  Administered 2013-08-01: 0.6 mg via INTRAVENOUS

## 2013-08-01 MED ORDER — SUCCINYLCHOLINE CHLORIDE 20 MG/ML IJ SOLN
INTRAMUSCULAR | Status: DC | PRN
  Administered 2013-08-01: 100 mg via INTRAVENOUS

## 2013-08-01 MED ORDER — PHENYLEPHRINE HCL 10 MG/ML IJ SOLN
INTRAMUSCULAR | Status: DC | PRN
  Administered 2013-08-01: 80 ug via INTRAVENOUS
  Administered 2013-08-01: 120 ug via INTRAVENOUS

## 2013-08-01 MED ORDER — ROCURONIUM BROMIDE 50 MG/5ML IV SOLN
INTRAVENOUS | Status: AC
  Filled 2013-08-01: qty 2

## 2013-08-01 MED ORDER — INSULIN ASPART 100 UNIT/ML ~~LOC~~ SOLN
0.0000 [IU] | SUBCUTANEOUS | Status: DC
  Administered 2013-08-01 – 2013-08-02 (×7): 4 [IU] via SUBCUTANEOUS
  Administered 2013-08-03: 7 [IU] via SUBCUTANEOUS
  Administered 2013-08-03: 4 [IU] via SUBCUTANEOUS
  Administered 2013-08-03: 7 [IU] via SUBCUTANEOUS
  Administered 2013-08-03 (×2): 3 [IU] via SUBCUTANEOUS
  Administered 2013-08-04 (×2): 4 [IU] via SUBCUTANEOUS
  Administered 2013-08-04: 3 [IU] via SUBCUTANEOUS
  Administered 2013-08-04 (×3): 7 [IU] via SUBCUTANEOUS
  Administered 2013-08-05 – 2013-08-08 (×4): 3 [IU] via SUBCUTANEOUS
  Administered 2013-08-09: 2 [IU] via SUBCUTANEOUS
  Administered 2013-08-09: 3 [IU] via SUBCUTANEOUS
  Administered 2013-08-10 (×2): 4 [IU] via SUBCUTANEOUS
  Administered 2013-08-10: 3 [IU] via SUBCUTANEOUS
  Administered 2013-08-10 – 2013-08-11 (×5): 4 [IU] via SUBCUTANEOUS
  Administered 2013-08-11: 3 [IU] via SUBCUTANEOUS
  Administered 2013-08-11: 4 [IU] via SUBCUTANEOUS
  Administered 2013-08-11: 7 [IU] via SUBCUTANEOUS
  Administered 2013-08-11: 4 [IU] via SUBCUTANEOUS
  Administered 2013-08-11: 7 [IU] via SUBCUTANEOUS
  Administered 2013-08-12 (×3): 3 [IU] via SUBCUTANEOUS
  Administered 2013-08-12 (×2): 4 [IU] via SUBCUTANEOUS
  Administered 2013-08-13 (×3): 3 [IU] via SUBCUTANEOUS
  Administered 2013-08-13 – 2013-08-14 (×3): 4 [IU] via SUBCUTANEOUS
  Administered 2013-08-14 – 2013-08-15 (×2): 3 [IU] via SUBCUTANEOUS
  Administered 2013-08-15: 4 [IU] via SUBCUTANEOUS
  Administered 2013-08-15 (×2): 3 [IU] via SUBCUTANEOUS
  Administered 2013-08-15 (×2): 4 [IU] via SUBCUTANEOUS
  Administered 2013-08-16: 3 [IU] via SUBCUTANEOUS
  Administered 2013-08-16: 7 [IU] via SUBCUTANEOUS
  Administered 2013-08-16 – 2013-08-17 (×4): 4 [IU] via SUBCUTANEOUS
  Administered 2013-08-17: 3 [IU] via SUBCUTANEOUS
  Administered 2013-08-17 (×2): 4 [IU] via SUBCUTANEOUS
  Administered 2013-08-17 – 2013-08-18 (×2): 3 [IU] via SUBCUTANEOUS
  Administered 2013-08-18: 4 [IU] via SUBCUTANEOUS
  Administered 2013-08-18: 3 [IU] via SUBCUTANEOUS
  Administered 2013-08-19: 4 [IU] via SUBCUTANEOUS
  Administered 2013-08-19 – 2013-08-25 (×11): 3 [IU] via SUBCUTANEOUS

## 2013-08-01 MED ORDER — PHENYLEPHRINE HCL 10 MG/ML IJ SOLN
30.0000 ug/min | INTRAMUSCULAR | Status: DC
  Filled 2013-08-01 (×2): qty 1

## 2013-08-01 MED ORDER — NEOSTIGMINE METHYLSULFATE 10 MG/10ML IV SOLN
INTRAVENOUS | Status: DC | PRN
  Administered 2013-08-01: 4 mg via INTRAVENOUS

## 2013-08-01 MED ORDER — TRACE MINERALS CR-CU-F-FE-I-MN-MO-SE-ZN IV SOLN
INTRAVENOUS | Status: AC
  Administered 2013-08-01: 21:00:00 via INTRAVENOUS
  Filled 2013-08-01: qty 1000

## 2013-08-01 MED ORDER — ONDANSETRON HCL 4 MG/2ML IJ SOLN
INTRAMUSCULAR | Status: DC | PRN
  Administered 2013-08-01: 4 mg via INTRAVENOUS

## 2013-08-01 MED ORDER — FENTANYL CITRATE 0.05 MG/ML IJ SOLN
50.0000 ug | INTRAMUSCULAR | Status: DC | PRN
  Filled 2013-08-01: qty 2

## 2013-08-01 MED ORDER — ALBUMIN HUMAN 5 % IV SOLN
INTRAVENOUS | Status: DC | PRN
  Administered 2013-08-01 (×2): via INTRAVENOUS

## 2013-08-01 MED ORDER — INSULIN ASPART 100 UNIT/ML ~~LOC~~ SOLN
2.0000 [IU] | SUBCUTANEOUS | Status: DC
  Administered 2013-08-01: 4 [IU] via SUBCUTANEOUS

## 2013-08-01 MED ORDER — FAT EMULSION 20 % IV EMUL
250.0000 mL | INTRAVENOUS | Status: AC
  Administered 2013-08-01: 250 mL via INTRAVENOUS
  Filled 2013-08-01: qty 250

## 2013-08-01 MED ORDER — STERILE WATER FOR IRRIGATION IR SOLN
Status: DC | PRN
  Administered 2013-08-01: 1000 mL

## 2013-08-01 MED ORDER — ALBUMIN HUMAN 5 % IV SOLN
INTRAVENOUS | Status: AC
  Filled 2013-08-01: qty 250

## 2013-08-01 MED ORDER — SUCCINYLCHOLINE CHLORIDE 20 MG/ML IJ SOLN
INTRAMUSCULAR | Status: AC
  Filled 2013-08-01: qty 1

## 2013-08-01 MED ORDER — PROPOFOL 10 MG/ML IV BOLUS
INTRAVENOUS | Status: AC
  Filled 2013-08-01: qty 20

## 2013-08-01 MED ORDER — FENTANYL CITRATE 0.05 MG/ML IJ SOLN
50.0000 ug | INTRAMUSCULAR | Status: AC | PRN
  Administered 2013-08-01 (×3): 100 ug via INTRAVENOUS
  Filled 2013-08-01 (×2): qty 2

## 2013-08-01 MED ORDER — LACTATED RINGERS IV SOLN
INTRAVENOUS | Status: DC
  Administered 2013-08-01: 11:00:00 via INTRAVENOUS

## 2013-08-01 MED ORDER — SUCCINYLCHOLINE CHLORIDE 20 MG/ML IJ SOLN: INTRAMUSCULAR | Status: DC | PRN

## 2013-08-01 MED ORDER — FENTANYL CITRATE 0.05 MG/ML IJ SOLN
INTRAMUSCULAR | Status: DC | PRN
Start: 2013-08-01 — End: 2013-08-01
  Administered 2013-08-01 (×5): 50 ug via INTRAVENOUS

## 2013-08-01 MED ORDER — BIOTENE DRY MOUTH MT LIQD
15.0000 mL | Freq: Four times a day (QID) | OROMUCOSAL | Status: DC
  Administered 2013-08-02 – 2013-08-26 (×97): 15 mL via OROMUCOSAL

## 2013-08-01 MED ORDER — FENTANYL BOLUS VIA INFUSION
25.0000 ug | INTRAVENOUS | Status: DC | PRN
  Administered 2013-08-01 – 2013-08-06 (×3): 50 ug via INTRAVENOUS
  Administered 2013-08-06: 100 ug via INTRAVENOUS
  Filled 2013-08-01: qty 50

## 2013-08-01 MED ORDER — FENTANYL CITRATE 0.05 MG/ML IJ SOLN: 50.0000 ug | INTRAMUSCULAR | Status: DC | PRN

## 2013-08-01 MED ORDER — FENTANYL CITRATE 0.05 MG/ML IJ SOLN: 50.0000 ug | Freq: Once | INTRAMUSCULAR | Status: DC

## 2013-08-01 MED ORDER — ONDANSETRON HCL 4 MG/2ML IJ SOLN
INTRAMUSCULAR | Status: AC
  Filled 2013-08-01: qty 2

## 2013-08-01 MED ORDER — SODIUM CHLORIDE 0.9 % IV SOLN
10.0000 mg | INTRAVENOUS | Status: DC | PRN
  Administered 2013-08-01: 20 ug/min via INTRAVENOUS

## 2013-08-01 SURGICAL SUPPLY — 53 items
BLADE SURG ROTATE 9660 (MISCELLANEOUS) IMPLANT
BRR ADH 5X3 SEPRAFILM 6 SHT (MISCELLANEOUS) ×2
CANISTER SUCTION 2500CC (MISCELLANEOUS) ×3 IMPLANT
COVER MAYO STAND STRL (DRAPES) IMPLANT
COVER SURGICAL LIGHT HANDLE (MISCELLANEOUS) ×3 IMPLANT
DRAPE LAPAROSCOPIC ABDOMINAL (DRAPES) ×3 IMPLANT
DRAPE PROXIMA HALF (DRAPES) IMPLANT
DRAPE UTILITY 15X26 W/TAPE STR (DRAPE) ×6 IMPLANT
DRAPE WARM FLUID 44X44 (DRAPE) ×3 IMPLANT
DRSG OPSITE POSTOP 4X10 (GAUZE/BANDAGES/DRESSINGS) IMPLANT
DRSG OPSITE POSTOP 4X8 (GAUZE/BANDAGES/DRESSINGS) IMPLANT
ELECT BLADE 6.5 EXT (BLADE) ×1 IMPLANT
ELECT CAUTERY BLADE 6.4 (BLADE) ×3 IMPLANT
ELECT REM PT RETURN 9FT ADLT (ELECTROSURGICAL) ×3
ELECTRODE REM PT RTRN 9FT ADLT (ELECTROSURGICAL) ×2 IMPLANT
GLOVE BIOGEL PI IND STRL 7.5 (GLOVE) IMPLANT
GLOVE BIOGEL PI INDICATOR 7.5 (GLOVE) ×1
GLOVE ECLIPSE 7.5 STRL STRAW (GLOVE) ×2 IMPLANT
GLOVE SURG SIGNA 7.5 PF LTX (GLOVE) ×3 IMPLANT
GLOVE SURG SS PI 7.0 STRL IVOR (GLOVE) ×3 IMPLANT
GOWN STRL REUS W/ TWL LRG LVL3 (GOWN DISPOSABLE) ×4 IMPLANT
GOWN STRL REUS W/ TWL XL LVL3 (GOWN DISPOSABLE) ×2 IMPLANT
GOWN STRL REUS W/TWL LRG LVL3 (GOWN DISPOSABLE) ×6
GOWN STRL REUS W/TWL XL LVL3 (GOWN DISPOSABLE) ×9
KIT BASIN OR (CUSTOM PROCEDURE TRAY) ×3 IMPLANT
KIT OSTOMY DRAINABLE 2.75 STR (WOUND CARE) ×1 IMPLANT
KIT ROOM TURNOVER OR (KITS) ×3 IMPLANT
LIGASURE IMPACT 36 18CM CVD LR (INSTRUMENTS) ×1 IMPLANT
NS IRRIG 1000ML POUR BTL (IV SOLUTION) ×8 IMPLANT
PACK GENERAL/GYN (CUSTOM PROCEDURE TRAY) ×3 IMPLANT
PAD ARMBOARD 7.5X6 YLW CONV (MISCELLANEOUS) ×4 IMPLANT
PAD SHARPS MAGNETIC DISPOSAL (MISCELLANEOUS) ×1 IMPLANT
RELOAD PROXIMATE 75MM BLUE (ENDOMECHANICALS) ×15 IMPLANT
RELOAD STAPLE 75 3.8 BLU REG (ENDOMECHANICALS) IMPLANT
SEPRAFILM PROCEDURAL PACK 3X5 (MISCELLANEOUS) ×1 IMPLANT
SPECIMEN JAR LARGE (MISCELLANEOUS) IMPLANT
SPONGE GAUZE 4X4 12PLY STER LF (GAUZE/BANDAGES/DRESSINGS) ×1 IMPLANT
SPONGE LAP 18X18 X RAY DECT (DISPOSABLE) ×8 IMPLANT
STAPLER GUN LINEAR PROX 60 (STAPLE) ×1 IMPLANT
STAPLER PROXIMATE 75MM BLUE (STAPLE) ×1 IMPLANT
STAPLER VISISTAT 35W (STAPLE) ×3 IMPLANT
SUCTION POOLE TIP (SUCTIONS) ×3 IMPLANT
SUT PDS AB 1 TP1 96 (SUTURE) ×7 IMPLANT
SUT SILK 2 0 SH CR/8 (SUTURE) ×4 IMPLANT
SUT SILK 2 0 TIES 10X30 (SUTURE) ×3 IMPLANT
SUT SILK 3 0 SH CR/8 (SUTURE) ×8 IMPLANT
SUT SILK 3 0 TIES 10X30 (SUTURE) ×3 IMPLANT
SUT VIC AB 3-0 SH 18 (SUTURE) ×1 IMPLANT
TAPE CLOTH SURG 4X10 WHT LF (GAUZE/BANDAGES/DRESSINGS) ×1 IMPLANT
TOWEL OR 17X24 6PK STRL BLUE (TOWEL DISPOSABLE) ×3 IMPLANT
TOWEL OR 17X26 10 PK STRL BLUE (TOWEL DISPOSABLE) ×3 IMPLANT
TRAY FOLEY CATH 16FRSI W/METER (SET/KITS/TRAYS/PACK) ×1 IMPLANT
YANKAUER SUCT BULB TIP NO VENT (SUCTIONS) ×1 IMPLANT

## 2013-08-01 NOTE — Anesthesia Preprocedure Evaluation (Addendum)
Anesthesia Evaluation  Patient identified by MRN, date of birth, ID band Patient awake    Reviewed: Allergy & Precautions, H&P , NPO status , Patient's Chart, lab work & pertinent test results, reviewed documented beta blocker date and time   History of Anesthesia Complications Negative for: history of anesthetic complications  Airway Mallampati: II TM Distance: >3 FB Neck ROM: full    Dental  (+) Edentulous Upper, Lower Dentures, Dental Advisory Given   Pulmonary Current Smoker,    + wheezing      Cardiovascular hypertension, On Medications and On Home Beta Blockers + angina + CAD, + Cardiac Stents and + Peripheral Vascular Disease + Valvular Problems/Murmurs Rhythm:regular     Neuro/Psych PSYCHIATRIC DISORDERS negative neurological ROS     GI/Hepatic negative GI ROS, Neg liver ROS,   Endo/Other  negative endocrine ROS  Renal/GU negative Renal ROS  negative genitourinary   Musculoskeletal   Abdominal   Peds  Hematology negative hematology ROS (+)   Anesthesia Other Findings See surgeon's H&P   Reproductive/Obstetrics negative OB ROS                          Anesthesia Physical Anesthesia Plan  ASA: III  Anesthesia Plan: General   Post-op Pain Management:    Induction: Intravenous  Airway Management Planned: Oral ETT  Additional Equipment:   Intra-op Plan:   Post-operative Plan: Post-operative intubation/ventilation  Informed Consent: I have reviewed the patients History and Physical, chart, labs and discussed the procedure including the risks, benefits and alternatives for the proposed anesthesia with the patient or authorized representative who has indicated his/her understanding and acceptance.   Dental Advisory Given  Plan Discussed with: CRNA, Surgeon and Anesthesiologist  Anesthesia Plan Comments:        Anesthesia Quick Evaluation

## 2013-08-01 NOTE — Op Note (Signed)
EXPLORATORY LAPAROTOMY, PARTIAL COLECTOMY, COLOSTOMY, SMALL BOWEL RESECTION  Procedure Note  [name] 07/29/2013 - 08/01/2013   Pre-op Diagnosis: COLON OBSTRUCTION     Post-op Diagnosis: same  Procedure(s): EXPLORATORY LAPAROTOMY PARTIAL COLECTOMY COLOSTOMY SMALL BOWEL RESECTION REPAIR OF ENTEROTOMY LYSIS OF ADHESIONS (2 HOURS)  Surgeon(s): Harl Bowie, MD  Anesthesia: General  Staff:  Circulator: Jaci Standard, RN Relief Circulator: Starla Link, RN; Cyd Silence, RN Scrub Person: Sanjuan Dame Chula, CST; Randalyn Rhea, RN RN First Assistant: Cyd Silence, RN  Estimated Blood Loss: 500 ML               Specimens: SENT TO PATH          Coralie Keens A   Date: 08/01/2013  Time: 3:31 PM

## 2013-08-01 NOTE — Progress Notes (Signed)
INITIAL NUTRITION ASSESSMENT  DOCUMENTATION CODES Per approved criteria  -Obesity Unspecified   INTERVENTION: TPN per PharmD. RD to continue to follow nutrition care plan.  NUTRITION DIAGNOSIS: Inadequate oral intake related to altered GI function as evidenced by need for bowel rest.   Goal: Intake to meet >90% of estimated nutrition needs. Advancement of diet.  Monitor:  weight trends, lab trends, I/O's, TPN provision, advancement to diet vs enteral nutrition  Reason for Assessment: New TPN  73 y.o. female  Admitting Dx: Colonic obstruction  ASSESSMENT: PMHx significant for diverticulitis, CAD, HTN, depression. Admitted with abdominal pain and c/n x 2 weeks. Hx of lysis of adhesions and ovarian cystectomy for SBO in April 2015. Work-up reveals colonic obstruction proximal to descending colon, MD suspects etiology of ischemia vs malignancy.  NGT placed for decompression on admission.  TPN started by Dr. Johney Maine for prolonged obstruction and expected prolonged post-op ileus. Pt is currently in OR for exp lap - with partial colectomy and colostomy.  Patient is receiving TPN with Clinimix E 5/15 @ 40 ml/hr and lipids @ 20 ml/hr. Provides 1162 kcal and 48 grams protein per day. Meets 66% minimum estimated energy needs and 60% minimum estimated protein needs.  CBG's: 104 - 164 Sodium WNL Potassium low at 3.0 Phosphorus WNL Magnesium WNL Triglyceride WNL Prealbumin pending  Height: Ht Readings from Last 1 Encounters:  07/29/13 5' (1.524 m)    Weight: Wt Readings from Last 1 Encounters:  07/29/13 154 lb 3.2 oz (69.945 kg)    Ideal Body Weight: 100 lb  % Ideal Body Weight: 154%  Wt Readings from Last 10 Encounters:  07/29/13 154 lb 3.2 oz (69.945 kg)  07/29/13 154 lb 3.2 oz (69.945 kg)  03/31/13 162 lb (73.483 kg)  01/04/13 160 lb (72.576 kg)  12/21/12 162 lb 11.2 oz (73.8 kg)  12/21/12 162 lb 11.2 oz (73.8 kg)  12/13/12 158 lb (71.668 kg)  12/05/11 160 lb  (72.576 kg)  11/20/10 168 lb (76.204 kg)  11/08/09 175 lb (79.379 kg)    Usual Body Weight: 160 lb  % Usual Body Weight: 96%  BMI:  Body mass index is 30.12 kg/(m^2). Obese Class I  Estimated Nutritional Needs: Kcal: 1750 - 1900 Protein: 80 - 95 g Fluid: 1.8 - 2 liters  Skin: closed abdominal incision  Diet Order: NPO  EDUCATION NEEDS: -No education needs identified at this time   Intake/Output Summary (Last 24 hours) at 08/01/13 1256 Last data filed at 08/01/13 1249  Gross per 24 hour  Intake 1318.32 ml  Output    350 ml  Net 968.32 ml    Last BM: 7/4  Labs:   Recent Labs Lab 07/29/13 2128 07/30/13 0529 07/31/13 1045 08/01/13 0400  NA  --  143 141 141  K  --  3.0* 2.8* 3.0*  CL  --  107 102 104  CO2  --  21 24 25   BUN  --  11 5* 5*  CREATININE 0.48* 0.48* 0.44* 0.42*  CALCIUM  --  8.3* 8.2* 8.1*  MG 1.8  --   --  2.0  PHOS  --   --   --  2.3  GLUCOSE  --  112* 111* 147*    CBG (last 3)   Recent Labs  08/01/13 0100 08/01/13 0656 08/01/13 1023  GLUCAP 150* 164* 104*   No results found for this basename: prealbumin   Triglycerides  Date/Time Value Ref Range Status  08/01/2013  4:00 AM 96  <150  mg/dL Final    Scheduled Meds: . [facility] HOLD] aspirin  300 mg Rectal Daily  . [MAR HOLD] cefTRIAXone (ROCEPHIN)  IV  2 g Intravenous Q24H  . [MAR HOLD] enoxaparin (LOVENOX) injection  40 mg Subcutaneous Q24H  . [MAR HOLD] fluconazole (DIFLUCAN) IV  200 mg Intravenous Q24H  . [MAR HOLD] insulin aspart  0-9 Units Subcutaneous TID WC  . [MAR HOLD] lip balm  1 application Topical BID  . [facility] HOLD] metoprolol  5 mg Intravenous 4 times per day  . [facility] HOLD] metronidazole  500 mg Intravenous Q8H  . metronidazole  500 mg Intravenous To OR  . [MAR HOLD] pantoprazole (PROTONIX) IV  40 mg Intravenous Q24H  . [facility] HOLD] potassium chloride  10 mEq Intravenous Once  . [facility] HOLD] potassium chloride  10 mEq Intravenous Q1 Hr x 5    Continuous Infusions: .  dexrose 5 % and 0.45 % NaCl with KCl 30 mEq/L 50 mL/hr at 08/01/13 1020  . [MAR HOLD] .TPN (CLINIMIX-E) Adult 40 mL/hr at 07/31/13 1749   And  . [facility] HOLD] fat emulsion 250 mL (07/31/13 1749)  . [MAR HOLD] .TPN (CLINIMIX-E) Adult     And  . [MAR HOLD] fat emulsion    . lactated ringers 50 mL/hr at 08/01/13 1111    Past Medical History  Diagnosis Date  . Carotid artery disease     a. 60-70% bilat ICA stenosis by dopplers 05/2012.  Marland Kitchen Other and unspecified hyperlipidemia   . CAD (coronary artery disease)     a. Mod dz 2010 initially mgd medically. b. 12/2012 - angina s/p PTCA/DES to mid-circumflex, PTCA/DES to first OM.   Marland Kitchen Hypertension   . Heart murmur   . Depression   . Barrett's esophagus   . Arthritis     Past Surgical History  Procedure Laterality Date  . Appendectomy    . Cholecystectomy    . Colon resection    . 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 Rotator cuff repair Left   . Abdominal hysterectomy    . Colon surgery    . Foot surgery    . Hand surgery      Inda Coke MS, RD, LDN Inpatient Registered Dietitian Pager: [phone] After-hours pager: [phone]

## 2013-08-01 NOTE — Anesthesia Procedure Notes (Signed)
Procedure Name: Intubation Date/Time: 08/01/2013 12:49 PM Performed by: Scheryl Darter Pre-anesthesia Checklist: Patient identified, Emergency Drugs available, Suction available, Patient being monitored and Timeout performed Patient Re-evaluated:Patient Re-evaluated prior to inductionOxygen Delivery Method: Circle system utilized Preoxygenation: Pre-oxygenation with 100% oxygen Intubation Type: IV induction and Rapid sequence Ventilation: Mask ventilation without difficulty Laryngoscope Size: Miller and 2 Grade View: Grade I Tube type: Oral Tube size: 7.5 mm Number of attempts: 1 Airway Equipment and Method: Stylet

## 2013-08-01 NOTE — Progress Notes (Signed)
eLink Physician-Brief Progress Note Patient Name: [name] DOB: 10/29/1940 MRN: [id number]  Date of Service  08/01/2013   HPI/Events of Note  Issue of hives noted  eICU Interventions  Doubt from blood Was pre existing, monitor    Intervention Category Major Interventions: Other:  Asencion Noble 08/01/2013, 7:39 PM

## 2013-08-01 NOTE — Progress Notes (Signed)
Pt's daughter reports pt was "almost lost" during last surgery, difficulty coming out of anesthesia. Also reports pt has broken out in hives in the past using lasix.

## 2013-08-01 NOTE — Anesthesia Postprocedure Evaluation (Signed)
  Anesthesia Post-op Note  Patient: [name]  Procedure(s) Performed: Procedure(s): EXPLORATORY LAPAROTOMY (N/A) PARTIAL COLECTOMY (N/A) COLOSTOMY (N/A) SMALL BOWEL RESECTION (N/A)  Patient Location: PACU  Anesthesia Type:General  Level of Consciousness: sedated and Patient remains intubated per anesthesia plan  Airway and Oxygen Therapy: Patient remains intubated per anesthesia plan and Patient placed on Ventilator (see vital sign flow sheet for setting)  Post-op Pain: none  Post-op Assessment: Post-op Vital signs reviewed  Post-op Vital Signs: Reviewed  Last Vitals:  Filed Vitals:   08/01/13 1715  BP: 82/40  Pulse: 101  Temp: 36.8 C  Resp: 27    Complications: No apparent anesthesia complications

## 2013-08-01 NOTE — Progress Notes (Signed)
1845 Patient arrived to 2M05. Handoff report received from [place], Therapist, sports. Pt VSS. Pt currently on ventilator, lungs sound clear, diminished in lower bases. Heart rate currently sustaining in 110s. Second unit of PRBC currently infusing. Georgann Housekeeper, NP currently at bedside assessing patient.

## 2013-08-01 NOTE — Progress Notes (Addendum)
PARENTERAL NUTRITION CONSULT NOTE - Follow-up  Pharmacy Consult for TPN Indication: Bowel Obstruction  Allergies  Allergen Reactions  . Pregabalin Swelling    Tongue swelling  . Sulfonamide Derivatives Swelling    Childhood reaction   Patient Measurements: Height: 5' (152.4 cm) Weight: 154 lb 3.2 oz (69.945 kg) IBW/kg (Calculated) : 45.5  Vital Signs: Temp: 98.8 F (37.1 C) (07/13 0509) Temp src: Oral (07/13 0509) BP: 154/85 mmHg (07/13 0509) Pulse Rate: 107 (07/13 0509) Intake/Output from previous day: 07/12 0701 - 07/13 0700 In: 1468.3 [NG/GT:360; IV Piggyback:550; TPN:558.3] Out: 130 [Emesis/NG output:130] Intake/Output from this shift:  Labs:  Recent Labs  07/30/13 0529 07/31/13 1045 08/01/13 0400  WBC 10.2 8.6 8.9  HGB 10.1* 9.7* 9.4*  HCT 32.0* 30.4* 29.3*  PLT 276 255 255    Recent Labs  07/29/13 1605 07/29/13 2128 07/30/13 0529 07/31/13 1045 08/01/13 0400  NA 145  --  143 141 141  K 2.9*  --  3.0* 2.8* 3.0*  CL 105  --  107 102 104  CO2 23  --  21 24 25   GLUCOSE 135*  --  112* 111* 147*  BUN 13  --  11 5* 5*  CREATININE 0.49* 0.48* 0.48* 0.44* 0.42*  CALCIUM 8.8  --  8.3* 8.2* 8.1*  MG  --  1.8  --   --  2.0  PHOS  --   --   --   --  2.3  PROT 5.9*  --   --   --  5.1*  ALBUMIN 2.9*  --   --   --  2.3*  AST 14  --   --   --  12  ALT 13  --   --   --  10  ALKPHOS 82  --   --   --  72  BILITOT 0.4  --   --   --  0.4  TRIG  --   --   --   --  96   Estimated Creatinine Clearance: 53.9 ml/min (by C-G formula based on Cr of 0.42).    Insulin Requirements in the past 24 hours:  2 units Novolog SSI  Current Nutrition:  Clinimix E 5/15 at 40 ml/hr and 20% lipids at 10 ml/hr provides 48 gm protein and 1162 kcal.  Nutritional Goals:  Await RD recommendations  Assessment: Admit: 73 yo female admitted with c/o abdominal pain associated with N&V.  She has been admitted to [facility] multiple times over the past few months with a similar  complaint.  Of note, past surgical history reveals an appendectomy, cholecystectomy, colon resection and an abdominal hysterectomy.  GI: Pt with prolonged obstruction and expect prolonged post-op ileus. Plan for exp lap/LOA/colon resection with colostomy 7/13. NPO with TPN.  Endo: No h/o DM. CBGs well controlled thus far on SSI.  Lytes: K remains low at 3 (MD replacing with 80mEq KCl IV), Mg 2, Phos 2.3.    Renal: SCr stable, pt making urine - UOP not recorded accurately. MIVF: D51/2NC with 10 mEq KCl at 50 ml/hr.  Pulm: 2L [place]  Cards: CAD, HTN. VSS. On IV metoprolol, ASA supp.  Hepatobil: LFTs wnl. TG 96.  Neuro: h/o depression. Sertraline PTA  ID: Rocephin/Flagyl for intraabd process. Also on diflucan for candidal UTI.  Best Practices: Lovenox, PPI IV (home med)  Home meds: Will need resumed once pt able to take po  TPN Access: Double lumen PICC placed 7/12  TPN day#: 1  Plan:  1.  Continue Clinimix E 5/15 at rate of 2ml/hr and 20% lipids at 10 ml/hr with plans to advance to goal as tolerated. Will not advance today with continued low K. 2.  Continue SSI coverage 3.  Will increase amount of K in MIVF to 30 MEq KCl per liter (D5/12NS with 58mEq KCl) at 50 ml/hr. 4.  F/u BMET, prealbumin 5. F/u RD recommendations  Sherlon Handing, PharmD, BCPS Clinical pharmacist, pager [phone] 08/01/2013,8:10 AM

## 2013-08-01 NOTE — Progress Notes (Signed)
[place] Progress Note Patient Name: [name] DOB: 05/01/1940 MRN: [id number]  Date of Service  08/01/2013   HPI/Events of Note   Pt hyperglycemic  eICU Interventions  SSI ordered    Intervention Category Major Interventions: Hyperglycemia - active titration of insulin therapy  Asencion Noble 08/01/2013, 8:55 PM

## 2013-08-01 NOTE — Progress Notes (Signed)
Dr. [name] into see pt., aware of hives, order rec'd to d/c TPN/lipids.

## 2013-08-01 NOTE — H&P (Addendum)
PULMONARY / CRITICAL CARE MEDICINE   Name: [name] MRN: [id number] DOB: 04/27/1940    ADMISSION DATE:  07/29/2013 CONSULTATION DATE:  08/01/2013  REFERRING MD :  Surgery PRIMARY SERVICE: PCCM  CHIEF COMPLAINT:  Bowel obstruction  BRIEF PATIENT DESCRIPTION: 73 y/o female with PMH CAD, HTN, Barrett's esophagus, presented to [facility] ED 7/10 with LLQ pain. Found to have bowel obstruction. To OR for repair 7/13. Remains of vent post-op. PCCM to see.  SIGNIFICANT EVENTS: 7/10 Admit, CCS consulted 7/12 Cardiology consulted for pre-op evaluation 7/13 Laparotomy, partial colectomy, colostomy, SB resection, repair of enterotomy, lysis of adhesions  STUDIES:  7/10 - CT abd/pelvis > Dilated proximal colon with abrupt transition to normal caliber at the proximal descending colon compatible colonic obstruction. Diverticulitis vs colitis.   LINES / TUBES: OETT 7/13 >>> Colostomy 7/13 >>> PICC 7/12 >>> NGT 7/10 >>>  CULTURES: n/a  ANTIBIOTICS: Ceftriaxone 7/10 >>> Flagyl 7/10 >>> Fluconazole 7/10 >>>  HISTORY OF PRESENT ILLNESS: 73 yo female with PMH as below,  including significant coronary artery disease with stents on Plavix. She presented to [facility] with an acute abdomen in April. She underwent an emergent exploration by Dr. Noberto Retort. She had extensive lysis of adhesions. She was on the ventilator for several days but gradually improved. Since discharge from the hospital she has been readmitted 3 times with abdominal pain. Her abdominal pain has been crampy and intermittent. This worsened over the past week. She did not have a bowel movement for about 7 days. She had a CT scan back at [facility] on June 30. This demonstrated an area of inflammation and narrowing in her proximal descending colon. She was treated empirically for colitis with Cipro and Flagyl. She was set up for colonoscopy. Colonoscopy was attempted 7/6 and reportedly could not be completed. Some biopsies  were taken. It was felt she had a stricture in her colon and she was referred back to Dr. Noberto Retort. He scheduled her for barium enema. She became acutely worse 7/10 with vomiting and increased pain. She came to the [facility] for further workup including CT scan which shows colonic obstruction at this proximal descending colon location.There is some inflammation there as well. She was taken to OR for revision of this 7/13 and remains on ventilator post-operatively. PCCM to manage ICU care.   PAST MEDICAL HISTORY :  Past Medical History  Diagnosis Date  . Carotid artery disease     a. 60-70% bilat ICA stenosis by dopplers 05/2012.  Marland Kitchen Other and unspecified hyperlipidemia   . CAD (coronary artery disease)     a. Mod dz 2010 initially mgd medically. b. 12/2012 - angina s/p PTCA/DES to mid-circumflex, PTCA/DES to first OM.   Marland Kitchen Hypertension   . Heart murmur   . Depression   . Barrett's esophagus   . Arthritis    Past Surgical History  Procedure Laterality Date  . Appendectomy    . Cholecystectomy    . Colon resection    . 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 Rotator cuff repair Left   . Abdominal hysterectomy    . Colon surgery    . Foot surgery    . Hand surgery     Prior to Admission medications   Medication Sig Start Date End Date Taking? Authorizing Provider  aspirin EC 81 MG tablet Take 162 mg by mouth daily. Take 2 tablets  daily 12/21/12  Yes Dayna N Dunn, PA-C  atorvastatin (LIPITOR) 80 MG tablet Take 80 mg by mouth every evening.    Yes Historical Provider, MD  clopidogrel (PLAVIX) 75 MG tablet Take 1 tablet (75 mg total) by mouth daily. 12/21/12  Yes Dayna N Dunn, PA-C  dicyclomine (BENTYL) 10 MG capsule Take 10 mg by mouth daily as needed (stomach cramps).  08/20/10  Yes Historical Provider, MD  diphenhydramine-acetaminophen (TYLENOL PM) 25-500 MG TABS Take 1 tablet by mouth at bedtime.    Yes Historical Provider, MD  fish  oil-omega-3 fatty acids 1000 MG capsule Take 1,000 mg by mouth daily.    Yes Historical Provider, MD  folic acid (FOLVITE) 166 MCG tablet Take 800 mcg by mouth daily.    Yes Historical Provider, MD  ipratropium-albuterol (DUONEB) 0.5-2.5 (3) MG/3ML SOLN Take 3 mLs by nebulization every 6 (six) hours as needed (for shortness of breath).   Yes Historical Provider, MD  metoprolol succinate (TOPROL-XL) 25 MG 24 hr tablet Take 25 mg by mouth daily. 12/13/12  Yes Dorothy Spark, MD  ondansetron (ZOFRAN) 4 MG tablet Take 4 mg by mouth every 8 (eight) hours as needed for nausea or vomiting.   Yes Historical Provider, MD  oxyCODONE-acetaminophen (PERCOCET/ROXICET) 5-325 MG per tablet Take 1 tablet by mouth daily. 07/15/13  Yes Historical Provider, MD  pantoprazole (PROTONIX) 40 MG tablet Take 40 mg by mouth daily.   Yes Historical Provider, MD  sertraline (ZOLOFT) 100 MG tablet Take 200 mg by mouth daily.    Yes Historical Provider, MD  traMADol (ULTRAM) 50 MG tablet Take 50 mg by mouth every 6 (six) hours as needed for moderate pain.    Yes Historical Provider, MD  nitroGLYCERIN (NITROSTAT) 0.4 MG SL tablet Place 1 tablet (0.4 mg total) under the tongue every 5 (five) minutes as needed for chest pain. 12/13/12   Dorothy Spark, MD   Allergies  Allergen Reactions  . Pregabalin Swelling    Tongue swelling  . Sulfonamide Derivatives Swelling    Childhood reaction    FAMILY HISTORY:  History reviewed. No pertinent family history. SOCIAL HISTORY:  reports that she has been smoking Cigarettes.  She has been smoking about 0.00 packs per day. She has never used smokeless tobacco. She reports that she does not drink alcohol or use illicit drugs.  REVIEW OF SYSTEMS:  unable - intubated.   SUBJECTIVE:   VITAL SIGNS: Temp:  [98.3 F (36.8 C)-99.1 F (37.3 C)] 98.7 F (37.1 C) (07/13 2015) Pulse Rate:  [93-118] 118 (07/13 2130) Resp:  [18-33] 28 (07/13 2130) BP: (67-154)/(31-85) 116/55 mmHg (07/13  2130) SpO2:  [95 %-100 %] 100 % (07/13 2130) FiO2 (%):  [0.4 %-60 %] 60 % (07/13 2000) HEMODYNAMICS:   VENTILATOR SETTINGS: Vent Mode:  [-] PRVC FiO2 (%):  [0.4 %-60 %] 60 % Set Rate:  [16 bmp] 16 bmp Vt Set:  [360 mL] 360 mL PEEP:  [5 cmH20] 5 cmH20 INTAKE / OUTPUT: Intake/Output     07/13 0701 - 07/14 0700   I.V. (mL/kg) 3145 (45)   Blood 715.5   NG/GT    IV Piggyback 500   TPN 800.8   Total Intake(mL/kg) 5161.3 (73.8)   Urine (mL/kg/hr) 325 (0.3)   Emesis/NG output    Blood 350 (0.3)   Total Output 675   Net +4486.3         PHYSICAL EXAMINATION: General:  Pale, intubated, sedated.  Neuro:  RASS -1 - follows commands.  HEENT:  [place]/AT, ETT in place.  Cardiovascular:  RRR, no MRG Lungs:  Clear anterior Abdomen:  Soft, abd dressing in place. New colostomy, stoma red/moist. Some bloody discharge.  Ext:  No acute deformity. Pedal pulses +1, BLE warm.  Skin:  Appears pale. Ostomy to LLQ, vertical surgical incision to center of abdomen. Areas of urticaria on chest and extremities  LABS:  CBC  Recent Labs Lab 07/31/13 1045 08/01/13 0400 08/01/13 1643 08/01/13 1930  WBC 8.6 8.9  --  5.0  HGB 9.7* 9.4* 6.8* 10.8*  HCT 30.4* 29.3* 20.0* 32.7*  PLT 255 255  --  224   Coag's  Recent Labs Lab 08/01/13 1930  INR 1.25   BMET  Recent Labs Lab 07/31/13 1045 08/01/13 0400 08/01/13 1643 08/01/13 1930  NA 141 141 139 138  K 2.8* 3.0* 3.6* 3.8  CL 102 104  --  103  CO2 24 25  --  20  BUN 5* 5*  --  7  CREATININE 0.44* 0.42*  --  0.58  GLUCOSE 111* 147* 164* 193*   Electrolytes  Recent Labs Lab 07/29/13 2128  07/31/13 1045 08/01/13 0400 08/01/13 1930  CALCIUM  --   < > 8.2* 8.1* 7.8*  MG 1.8  --   --  2.0  --   PHOS  --   --   --  2.3  --   < > = values in this interval not displayed. Sepsis Markers  Recent Labs Lab 07/29/13 1624  LATICACIDVEN 0.86   ABG  Recent Labs Lab 08/01/13 1733  PHART 7.408  PCO2ART 33.8*  PO2ART 86.6   Liver  Enzymes  Recent Labs Lab 07/29/13 1605 08/01/13 0400  AST 14 12  ALT 13 10  ALKPHOS 82 72  BILITOT 0.4 0.4  ALBUMIN 2.9* 2.3*   Cardiac Enzymes  Recent Labs Lab 07/29/13 1605  TROPONINI <0.30   Glucose  Recent Labs Lab 08/01/13 08/01/13 0100 08/01/13 0656 08/01/13 1023 08/01/13 1853 08/01/13 1937  GLUCAP 130* 150* 164* 104* 144* 190*    Imaging Portable Chest Xray  08/01/2013   CLINICAL DATA:  Endotracheal tube placement.  EXAM: PORTABLE CHEST - 1 VIEW  COMPARISON:  July 29, 2013.  FINDINGS: Stable cardiomegaly. Endotracheal tube is seen projected over tracheal air shadow with distal tip 5 cm above the carina. Right-sided PICC line is noted with distal tip in expected position of the SVC. No pneumothorax is noted. Right upper lobe opacity is noted concerning for pneumonia or edema. Mild left basilar opacity is noted concerning for pneumonia or atelectasis. Nasogastric tube is seen passing through the esophagus into the stomach.  IMPRESSION: Endotracheal tube in grossly good position. Increased right upper lobe opacity concerning for pneumonia or edema. Left basilar opacity is noted concerning for pneumonia or subsegmental atelectasis.   Electronically Signed   By: Sabino Dick M.D.   On: 08/01/2013 19:15   Dg Abd Portable 1v  07/31/2013   CLINICAL DATA:  NG tube placement  EXAM: PORTABLE ABDOMEN - 1 VIEW  COMPARISON:  07/29/2013  FINDINGS: NG tube extends into the body of the stomach with side port at the GE junction. Oral contrast within the colon from recent CT exam. No evidence of bowel obstruction. There is dense left lower lobe atelectasis/infiltrate.  IMPRESSION: 1. NG tube in stomach.  Sideport at the GE junction. 2. No evidence of bowel obstruction. 3. Left lower lobe atelectasis/infiltrate   Electronically Signed   By: Helane Gunther.D.  On: 07/31/2013 15:47    ASSESSMENT / PLAN:  PULMONARY A: Acute respiratory failure 2nd to shock in post op setting. P:    Full vent support >> change to pressure control 7/13 F/u ABG, CXR PRN BD's  Pulmonary hygiene  CARDIOVASCULAR A:  Hemorraghic/hypovolemic shock >> Hgb 6.8 in PACU, 2 units PRBC given 7/13. H/o CAD, HTN. P:  Continue IV fluids Hold plavix, lipitor, ASA, lopressor for now  RENAL A:   Hypokalemia. P:   F/u and replace electrolytes as needed  GASTROINTESTINAL A:   Suspected colitis with colonic obstruction/SBO s/p repair with new colostomy 7/13. Hx of Barrett's esophagus. Nutrition. P:   Post op care, nutrition per surgery Wound/ostomy care consult Continue PPI  HEMATOLOGIC A:   Acute blood loss anemia. VTE ppx. P:  SCD's Hold enoxaparin, asa, plavix  Transfuse for Hb < 7 or bleeding  INFECTIOUS A:   Colitis vs diverticulitis P:   ABX as above Follow WBC and fever curve.   ENDOCRINE A:   No acute issues P:   Check CBG and treat is consistently greater than 180.   NEUROLOGIC A:   Acute encephalopathy in post operative setting Medical Sedation P:   RASS goal: -1 PRN fentanyl for sedation Restraints  DERMATOLOGY A: Urticaria >> family reports pt is prone to getting this. P: PRN benadryl  Georgann Housekeeper, ACNP [place] [facility] Pager [phone] or [phone]   Reviewed above, examined, and documentation changes made as needed.  73 yo female with VDRF and hemorrhagic shock after laparotomy.  Continue IV fluids, f/u Hb.  Transfuse for Hb < 7.  Adjust vent to pressure control.  F/u CXR and ABG.  Wean to extubate when medically more stable.  CC time 45 minutes.  Chesley Mires, MD [facility] Pulmonary/Critical Care 08/01/2013, 9:53 PM Pager:  [phone] After 3pm call: [phone]

## 2013-08-01 NOTE — Transfer of Care (Signed)
Immediate Anesthesia Transfer of Care Note  Patient: [name]  Procedure(s) Performed: Procedure(s): EXPLORATORY LAPAROTOMY (N/A) PARTIAL COLECTOMY (N/A) COLOSTOMY (N/A) SMALL BOWEL RESECTION (N/A)  Patient Location: PACU  Anesthesia Type:General  Level of Consciousness: awake and sedated  Airway & Oxygen Therapy: Patient Spontanous Breathing, Patient remains intubated per anesthesia plan and Patient placed on Ventilator (see vital sign flow sheet for setting)  Post-op Assessment: Report given to PACU RN, Post -op Vital signs reviewed and unstable, Anesthesiologist notified and hypotensive; phenylephrine drip continued  Post vital signs: Reviewed and stable  Complications: No apparent anesthesia complications

## 2013-08-01 NOTE — Progress Notes (Signed)
[facility] PROGRESS NOTE  [name] QVZ:[id number] DOB: 1940-01-26 DOA: 07/29/2013 PCP: Laverna Peace, NP  Assessment/Plan: 1. Suspected colitis with colonic obstruction/SBO 1. Surgery consulted with surgery planned for today 2. Pt has noted some improvement with NG to low wall suction 3. Pt is currently continued on empiric cipro and flagyl 4. Cardiology pre-op eval noted. Recs for ASA, increased beta blocker 5. Cont with supportive care 2. CAD 1. Pt is s/p cath and stenting in 12/14, followed by [facility] Cardiology 2. Perioperative ASA resumed per Cardiology 3. Pt remains asymptomatic and denies chest pain or sob 3. Candidal UTI 1. On IV diflucan 4. DVT prophylaxis 1. Lovenox  Code Status: Full Family Communication: Pt in room, husband at bedside Disposition Plan: Pending  Consultants:  General Surgery  Cardiology  Procedures:    Antibiotics:  Ciprofloxacin 7/10>>>  Flagyl 7/10>>>  Diflucan 7/10>>>  HPI/Subjective: Cont with abd pain.   Objective: Filed Vitals:   07/31/13 1332 07/31/13 2040 08/01/13 0048 08/01/13 0509  BP: 141/77 133/77 114/71 154/85  Pulse: 93 105 93 107  Temp: 98.4 F (36.9 C) 99.9 F (37.7 C) 98.9 F (37.2 C) 98.8 F (37.1 C)  TempSrc: Oral Oral  Oral  Resp: 16 16  18   Height:      Weight:      SpO2: 93% 93%  95%    Intake/Output Summary (Last 24 hours) at 08/01/13 1404 Last data filed at 08/01/13 1330  Gross per 24 hour  Intake 1368.32 ml  Output    555 ml  Net 813.32 ml   Filed Weights   07/29/13 2123  Weight: 154 lb 3.2 oz (69.945 kg)    Exam:   General:  Awake, nad  Cardiovascular: regular, s1, s2  Respiratory: normal resp effort, no crackles or wheezing  Abdomen: soft, nontender, NG in place to low wall suction  Musculoskeletal: perfused, no clubbing   Data Reviewed: Basic Metabolic Panel:  Recent Labs Lab 07/29/13 1605 07/29/13 2128 07/30/13 0529 07/31/13 1045 08/01/13 0400  NA 145   --  143 141 141  K 2.9*  --  3.0* 2.8* 3.0*  CL 105  --  107 102 104  CO2 23  --  21 24 25   GLUCOSE 135*  --  112* 111* 147*  BUN 13  --  11 5* 5*  CREATININE 0.49* 0.48* 0.48* 0.44* 0.42*  CALCIUM 8.8  --  8.3* 8.2* 8.1*  MG  --  1.8  --   --  2.0  PHOS  --   --   --   --  2.3   Liver Function Tests:  Recent Labs Lab 07/29/13 1605 08/01/13 0400  AST 14 12  ALT 13 10  ALKPHOS 82 72  BILITOT 0.4 0.4  PROT 5.9* 5.1*  ALBUMIN 2.9* 2.3*    Recent Labs Lab 07/29/13 1605  LIPASE 9*   No results found for this basename: AMMONIA,  in the last 168 hours CBC:  Recent Labs Lab 07/29/13 1605 07/29/13 2128 07/30/13 0529 07/31/13 1045 08/01/13 0400  WBC 13.5* 8.7 10.2 8.6 8.9  NEUTROABS 11.5*  --   --   --  7.2  HGB 11.0* 9.8* 10.1* 9.7* 9.4*  HCT 35.0* 31.0* 32.0* 30.4* 29.3*  MCV 85.2 85.9 86.5 85.6 85.9  PLT 312 294 276 255 255   Cardiac Enzymes:  Recent Labs Lab 07/29/13 1605  TROPONINI <0.30   BNP (last 3 results) No results found for this basename: PROBNP,  in the last  8760 hours CBG:  Recent Labs Lab 07/31/13 1805 08/01/13 08/01/13 0100 08/01/13 0656 08/01/13 1023  GLUCAP 114* 130* 150* 164* 104*    Recent Results (from the past 240 hour(s))  SURGICAL PCR SCREEN     Status: None   Collection Time    07/31/13  8:55 PM      Result Value Ref Range Status   MRSA, PCR NEGATIVE  NEGATIVE Final   Staphylococcus aureus NEGATIVE  NEGATIVE Final   Comment:            The Xpert SA Assay (FDA     approved for NASAL specimens     in patients over 48 years of age),     is one component of     a comprehensive surveillance     program.  Test performance has     been validated by [employer] for patients greater     than or equal to 9 year old.     It is not intended     to diagnose infection nor to     guide or monitor treatment.     Studies: Dg Abd Portable 1v  07/31/2013   CLINICAL DATA:  NG tube placement  EXAM: PORTABLE ABDOMEN - 1 VIEW   COMPARISON:  07/29/2013  FINDINGS: NG tube extends into the body of the stomach with side port at the GE junction. Oral contrast within the colon from recent CT exam. No evidence of bowel obstruction. There is dense left lower lobe atelectasis/infiltrate.  IMPRESSION: 1. NG tube in stomach.  Sideport at the GE junction. 2. No evidence of bowel obstruction. 3. Left lower lobe atelectasis/infiltrate   Electronically Signed   By: Suzy Bouchard M.D.   On: 07/31/2013 15:47    Scheduled Meds: . [facility] HOLD] aspirin  300 mg Rectal Daily  . [MAR HOLD] cefTRIAXone (ROCEPHIN)  IV  2 g Intravenous Q24H  . [MAR HOLD] enoxaparin (LOVENOX) injection  40 mg Subcutaneous Q24H  . [MAR HOLD] fluconazole (DIFLUCAN) IV  200 mg Intravenous Q24H  . [MAR HOLD] insulin aspart  0-9 Units Subcutaneous TID WC  . [MAR HOLD] lip balm  1 application Topical BID  . [facility] HOLD] metoprolol  5 mg Intravenous 4 times per day  . [facility] HOLD] metronidazole  500 mg Intravenous Q8H  . [MAR HOLD] pantoprazole (PROTONIX) IV  40 mg Intravenous Q24H  . [facility] HOLD] potassium chloride  10 mEq Intravenous Once   Continuous Infusions: . dexrose 5 % and 0.45 % NaCl with KCl 30 mEq/L 50 mL/hr at 08/01/13 1020  . [MAR HOLD] .TPN (CLINIMIX-E) Adult 40 mL/hr at 07/31/13 1749   And  . [facility] HOLD] fat emulsion 250 mL (07/31/13 1749)  . [MAR HOLD] .TPN (CLINIMIX-E) Adult     And  . [MAR HOLD] fat emulsion    . lactated ringers 50 mL/hr at 08/01/13 1111    Principal Problem:   Colonic obstruction Active Problems:   CAD (coronary artery disease)   Depression   Hypokalemia   Hypomagnesemia   Protein-calorie malnutrition, moderate  Time spent: 83min  [facility], [facility] Pager [phone]. If 7PM-7AM, please contact night-coverage at [email or web address], password [facility] 08/01/2013, 2:04 PM  LOS: 3 days

## 2013-08-01 NOTE — Progress Notes (Signed)
Patient ID: [name], female   DOB: [birth year], 74 y.o.   MRN: [id number]  Plan exploratory laparotomy with partial colectomy and colostomy today.  Again I discussed this in detail with the patient and her family. I discussed the risks which include but are not limited to bleeding, infection, injury to surrounding structures, need for further surgery, cardiopulmonary problems, DVT, etc.  She understands and agrees to proceed. Plan either ICU or step-down post op.

## 2013-08-02 ENCOUNTER — Inpatient Hospital Stay (HOSPITAL_COMMUNITY): Payer: Medicare HMO

## 2013-08-02 ENCOUNTER — Encounter (HOSPITAL_COMMUNITY): Payer: Self-pay | Admitting: Surgery

## 2013-08-02 DIAGNOSIS — K56609 Unspecified intestinal obstruction, unspecified as to partial versus complete obstruction: Secondary | ICD-10-CM

## 2013-08-02 DIAGNOSIS — J9600 Acute respiratory failure, unspecified whether with hypoxia or hypercapnia: Secondary | ICD-10-CM

## 2013-08-02 DIAGNOSIS — E46 Unspecified protein-calorie malnutrition: Secondary | ICD-10-CM

## 2013-08-02 DIAGNOSIS — E440 Moderate protein-calorie malnutrition: Secondary | ICD-10-CM

## 2013-08-02 LAB — CBC
HCT: 30.1 % — ABNORMAL LOW (ref 36.0–46.0)
HCT: 26.9 % — ABNORMAL LOW (ref 36.0–46.0)
HEMATOCRIT: 29.4 % — AB (ref 36.0–46.0)
HEMOGLOBIN: 8.9 g/dL — AB (ref 12.0–15.0)
Hemoglobin: 10 g/dL — ABNORMAL LOW (ref 12.0–15.0)
Hemoglobin: 9.5 g/dL — ABNORMAL LOW (ref 12.0–15.0)
MCH: 28.3 pg (ref 26.0–34.0)
MCH: 27.5 pg (ref 26.0–34.0)
MCH: 28.3 pg (ref 26.0–34.0)
MCHC: 33.2 g/dL (ref 30.0–36.0)
MCHC: 32.3 g/dL (ref 30.0–36.0)
MCHC: 33.1 g/dL (ref 30.0–36.0)
MCV: 85.3 fL (ref 78.0–100.0)
MCV: 85.2 fL (ref 78.0–100.0)
MCV: 85.7 fL (ref 78.0–100.0)
PLATELETS: 240 10*3/uL (ref 150–400)
Platelets: 222 10*3/uL (ref 150–400)
Platelets: 227 10*3/uL (ref 150–400)
RBC: 3.53 MIL/uL — ABNORMAL LOW (ref 3.87–5.11)
RBC: 3.45 MIL/uL — ABNORMAL LOW (ref 3.87–5.11)
RBC: 3.14 MIL/uL — AB (ref 3.87–5.11)
RDW: 14.7 % (ref 11.5–15.5)
RDW: 15.1 % (ref 11.5–15.5)
RDW: 15.2 % (ref 11.5–15.5)
WBC: 6.9 10*3/uL (ref 4.0–10.5)
WBC: 8.5 10*3/uL (ref 4.0–10.5)
WBC: 11.9 10*3/uL — ABNORMAL HIGH (ref 4.0–10.5)

## 2013-08-02 LAB — POCT I-STAT 3, ART BLOOD GAS (G3+)
ACID-BASE DEFICIT: 6 mmol/L — AB (ref 0.0–2.0)
Acid-base deficit: 4 mmol/L — ABNORMAL HIGH (ref 0.0–2.0)
BICARBONATE: 20.5 meq/L (ref 20.0–24.0)
Bicarbonate: 22.3 mEq/L (ref 20.0–24.0)
O2 SAT: 99 %
O2 Saturation: 92 %
PCO2 ART: 57.5 mmHg — AB (ref 35.0–45.0)
PO2 ART: 63 mmHg — AB (ref 80.0–100.0)
PO2 ART: 155 mmHg — AB (ref 80.0–100.0)
Patient temperature: 98.1
TCO2: 22 mmol/L (ref 0–100)
TCO2: 24 mmol/L (ref 0–100)
pCO2 arterial: 32.6 mmHg — ABNORMAL LOW (ref 35.0–45.0)
pH, Arterial: 7.407 (ref 7.350–7.450)
pH, Arterial: 7.196 — CL (ref 7.350–7.450)

## 2013-08-02 LAB — BASIC METABOLIC PANEL
ANION GAP: 12 (ref 5–15)
BUN: 9 mg/dL (ref 6–23)
CALCIUM: 7.7 mg/dL — AB (ref 8.4–10.5)
CO2: 22 mEq/L (ref 19–32)
CREATININE: 0.68 mg/dL (ref 0.50–1.10)
Chloride: 105 mEq/L (ref 96–112)
GFR calc non Af Amer: 84 mL/min — ABNORMAL LOW (ref >90)
Glucose, Bld: 208 mg/dL — ABNORMAL HIGH (ref 70–99)
Potassium: 3.6 mEq/L — ABNORMAL LOW (ref 3.7–5.3)
SODIUM: 139 meq/L (ref 137–147)

## 2013-08-02 LAB — GLUCOSE, CAPILLARY
GLUCOSE-CAPILLARY: 156 mg/dL — AB (ref 70–99)
Glucose-Capillary: 159 mg/dL — ABNORMAL HIGH (ref 70–99)
Glucose-Capillary: 159 mg/dL — ABNORMAL HIGH (ref 70–99)
Glucose-Capillary: 161 mg/dL — ABNORMAL HIGH (ref 70–99)
Glucose-Capillary: 169 mg/dL — ABNORMAL HIGH (ref 70–99)
Glucose-Capillary: 179 mg/dL — ABNORMAL HIGH (ref 70–99)
Glucose-Capillary: 196 mg/dL — ABNORMAL HIGH (ref 70–99)

## 2013-08-02 LAB — BLOOD GAS, ARTERIAL
Acid-base deficit: 3.3 mmol/L — ABNORMAL HIGH (ref 0.0–2.0)
BICARBONATE: 20.1 meq/L (ref 20.0–24.0)
Drawn by: 252031
FIO2: 0.4 %
O2 Saturation: 94.3 %
PEEP: 5 cmH2O
PH ART: 7.446 (ref 7.350–7.450)
PO2 ART: 66.4 mmHg — AB (ref 80.0–100.0)
PRESSURE CONTROL: 15 cmH2O
Patient temperature: 98.6
RATE: 18 resp/min
TCO2: 21 mmol/L (ref 0–100)
pCO2 arterial: 29.7 mmHg — ABNORMAL LOW (ref 35.0–45.0)

## 2013-08-02 LAB — TYPE AND SCREEN
ABO/RH(D): B POS
Antibody Screen: NEGATIVE
Unit division: 0
Unit division: 0

## 2013-08-02 MED ORDER — FENTANYL CITRATE 0.05 MG/ML IJ SOLN: 50.0000 ug | INTRAMUSCULAR | Status: DC | PRN

## 2013-08-02 MED ORDER — DIPHENHYDRAMINE HCL 50 MG/ML IJ SOLN
INTRAMUSCULAR | Status: AC
  Filled 2013-08-02: qty 1

## 2013-08-02 MED ORDER — POTASSIUM CHLORIDE 10 MEQ/50ML IV SOLN
INTRAVENOUS | Status: AC
  Filled 2013-08-02: qty 50

## 2013-08-02 MED ORDER — ROCURONIUM BROMIDE 50 MG/5ML IV SOLN
10.0000 mg | Freq: Once | INTRAVENOUS | Status: AC
  Administered 2013-08-02: 10 mg via INTRAVENOUS

## 2013-08-02 MED ORDER — METOPROLOL TARTRATE 1 MG/ML IV SOLN: 2.5000 mg | INTRAVENOUS | Status: DC | PRN

## 2013-08-02 MED ORDER — ESMOLOL BOLUS VIA INFUSION
250.0000 ug/kg | Freq: Once | INTRAVENOUS | Status: AC
  Filled 2013-08-02: qty 18000

## 2013-08-02 MED ORDER — SODIUM CHLORIDE 0.9 % IV BOLUS (SEPSIS)
500.0000 mL | Freq: Once | INTRAVENOUS | Status: AC
  Administered 2013-08-02: 500 mL via INTRAVENOUS

## 2013-08-02 MED ORDER — ROCURONIUM BROMIDE 50 MG/5ML IV SOLN
50.0000 mg | Freq: Once | INTRAVENOUS | Status: DC
  Filled 2013-08-02: qty 5

## 2013-08-02 MED ORDER — FENTANYL CITRATE 0.05 MG/ML IJ SOLN: 12.5000 ug | INTRAMUSCULAR | Status: DC | PRN

## 2013-08-02 MED ORDER — MIDAZOLAM HCL 2 MG/2ML IJ SOLN
4.0000 mg | Freq: Once | INTRAMUSCULAR | Status: AC
  Administered 2013-08-02: 4 mg via INTRAVENOUS

## 2013-08-02 MED ORDER — FENTANYL CITRATE 0.05 MG/ML IJ SOLN
200.0000 ug | Freq: Once | INTRAMUSCULAR | Status: AC
  Administered 2013-08-02: 200 ug via INTRAVENOUS

## 2013-08-02 MED ORDER — DEXTROSE 5 % IV SOLN
30.0000 ug/min | INTRAVENOUS | Status: AC
  Administered 2013-08-02: 110 ug/min via INTRAVENOUS
  Administered 2013-08-02 (×2): 30 ug/min via INTRAVENOUS
  Filled 2013-08-02 (×3): qty 1

## 2013-08-02 MED ORDER — ESMOLOL HCL-SODIUM CHLORIDE 2000 MG/100ML IV SOLN
25.0000 ug/kg/min | INTRAVENOUS | Status: DC
  Administered 2013-08-02 (×2): 25 ug/kg/min via INTRAVENOUS
  Filled 2013-08-02 (×2): qty 100

## 2013-08-02 MED ORDER — FUROSEMIDE 10 MG/ML IJ SOLN
20.0000 mg | Freq: Two times a day (BID) | INTRAMUSCULAR | Status: DC
Start: 2013-08-02 — End: 2013-08-03
  Administered 2013-08-02: 20 mg via INTRAVENOUS
  Filled 2013-08-02: qty 2

## 2013-08-02 MED ORDER — ETOMIDATE 2 MG/ML IV SOLN
10.0000 mg | Freq: Once | INTRAVENOUS | Status: AC
  Administered 2013-08-02: 10 mg via INTRAVENOUS

## 2013-08-02 MED ORDER — PHENYLEPHRINE HCL 10 MG/ML IJ SOLN
30.0000 ug/min | INTRAVENOUS | Status: DC
  Administered 2013-08-02: 130 ug/min via INTRAVENOUS
  Administered 2013-08-03: 170 ug/min via INTRAVENOUS
  Administered 2013-08-03: 190 ug/min via INTRAVENOUS
  Administered 2013-08-03 (×3): 200 ug/min via INTRAVENOUS
  Administered 2013-08-04: 80 ug/min via INTRAVENOUS
  Administered 2013-08-04: 70 ug/min via INTRAVENOUS
  Administered 2013-08-04: 180 ug/min via INTRAVENOUS
  Administered 2013-08-04: 200 ug/min via INTRAVENOUS
  Administered 2013-08-05 – 2013-08-07 (×2): 30 ug/min via INTRAVENOUS
  Filled 2013-08-02 (×14): qty 4

## 2013-08-02 MED ORDER — FENTANYL CITRATE 0.05 MG/ML IJ SOLN
INTRAMUSCULAR | Status: AC
  Filled 2013-08-02: qty 4

## 2013-08-02 MED ORDER — MIDAZOLAM HCL 2 MG/2ML IJ SOLN
INTRAMUSCULAR | Status: AC
  Filled 2013-08-02: qty 4

## 2013-08-02 MED ORDER — SODIUM CHLORIDE 0.9 % IV SOLN
INTRAVENOUS | Status: AC
  Administered 2013-08-03: 02:00:00 via INTRAVENOUS

## 2013-08-02 MED ORDER — POTASSIUM CHLORIDE 10 MEQ/50ML IV SOLN
10.0000 meq | INTRAVENOUS | Status: AC
  Administered 2013-08-02 (×3): 10 meq via INTRAVENOUS
  Filled 2013-08-02 (×2): qty 50

## 2013-08-02 MED ORDER — FAT EMULSION 20 % IV EMUL
250.0000 mL | INTRAVENOUS | Status: AC
  Administered 2013-08-02: 250 mL via INTRAVENOUS
  Filled 2013-08-02: qty 250

## 2013-08-02 MED ORDER — ESMOLOL BOLUS VIA INFUSION: 500.0000 ug/kg | Freq: Once | INTRAVENOUS | Status: DC

## 2013-08-02 MED ORDER — TRACE MINERALS CR-CU-F-FE-I-MN-MO-SE-ZN IV SOLN
INTRAVENOUS | Status: AC
  Administered 2013-08-02: 17:00:00 via INTRAVENOUS
  Filled 2013-08-02: qty 1000

## 2013-08-02 NOTE — Progress Notes (Signed)
Per Warren Lacy MD stop Esmolol and start Neo for low BP. MAP in the 40's. Per order call back if HR becomes greater than 120.

## 2013-08-02 NOTE — Op Note (Signed)
[name], [name] NO.:  [id number]  MEDICAL RECORD NO.:  [id number]  LOCATION:  2M05C                        FACILITY:  [facility]  PHYSICIAN:  Coralie Keens, M.D. DATE OF BIRTH:  1940/03/02  DATE OF PROCEDURE:  08/01/2013 DATE OF DISCHARGE:                              OPERATIVE REPORT   PREOPERATIVE DIAGNOSIS:  Colon obstruction.  POSTOPERATIVE DIAGNOSIS:  Colon obstruction.  PROCEDURE: 1. Exploratory laparotomy. 2. Partial colectomy. 3. End-colostomy with Hartmann procedure. 4. Small bowel resection. 5. Repair of multiple enterotomies. 6. Lysis of adhesions (2 hours).  SURGEON:  Coralie Keens, MD  ANESTHESIA:  General endotracheal anesthesia.  ESTIMATED BLOOD LOSS:  500 mL.  INDICATIONS:  This is a 73 year old female who presents with a colonic obstruction.  She had exploratory laparotomy at another institution for an acute abdomen several months ago.  Apparently, she had lysis of adhesions at that time.  She then presented back with abdominal pain and was found to have colitis.  Eventually, she had a colonoscopy which showed a stricturing of the colon because of worsening abdominal pain. She presented to the [facility] where she was admitted and found again to have near complete obstruction of her colon near the splenic flexure.  Decision was made to proceed to the operating room after she failed conservative management.  FINDINGS:  The patient was found to have a dense amount of adhesions in the abdominal cavity.  It took slightly over 2 hours to lyse all the adhesions.  Several enterotomies were created necessitating a small bowel resection and repair of enterotomies.  The actual stricture in the colon was complete and at the level of the splenic flexure.  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  The specimen was sent to Pathology for evaluation.  PROCEDURE IN DETAIL:  The  patient was identified as [name] and taken to the operating room.  She was placed supine on the operating room table and general anesthesia was induced.  Again, she was identified as correct patient and time-out was instituted.  Her abdomen was then prepped and draped in usual sterile fashion.  I made a midline incision in the patient's previous incision removing the scar.  I then took this down to the peritoneum with the entire length of incision. The patient had loops of small bowel completely tethered to the abdominal midline.  I made an enterotomy trying to get into the abdominal cavity at the area of small bowel.  The patient had extreme dense adhesions throughout her entire small bowel.  There were several loops completely stuck to each other and twisted.  I did undergo 2 hours of lysis of these dense adhesions.  Several enterotomies were created in a segment that was approximately 8-9 inches long.  Once all the adhesions were freed up, I decided to resect this area.  I transected proximally and distally with GIA 75 stapler and sweeped down the mesentery with the LigaSure device.  One other large enterotomy proximal to this in the jejunum was repaired in 2 layers with interrupted 3-0 silk sutures.  There were several different tears in the mesentery that occurred.  At one area  the bowel appeared ischemic so I did resect another section of small bowel that was adjacent to the first resection. All the small bowel specimens sent to Pathology for evaluation.  I ran the small bowel again from the ligament of Treitz to the terminal ileum, and repaired one other area of serosal injuries.  At this point again the entire right colon was massively dilated.  The descending colon was completely collapsed along the white line of Toldt.  I could feel a very hard, firm stricture at the splenic flexure.  I took down the splenic flexure with the electrocautery and the LigaSure.  This tight  stricture was the obvious cause of the colonic obstruction.  Again, it was difficult to tell whether this was a diverticular stricture or malignant stricture.  I favored a diverticular or ischemic stricture.  I then transected the colon proximal to distal to this area with GIA 75 stapler and took down the mesentery with the LigaSure.  The specimen was sent to Pathology for evaluation.  At this point, I made a separate circular incision for the ostomy site in the patient's left mid abdomen.  I performed this with electrocautery.  I then took this down the fascia which were opened in a cruciate fashion.  The underlying muscle fibers were then separated free.  I opened up the peritoneum and then pulled the transverse colon out as the colostomy.  Next, I reapproximated the resected areas of small bowel together in a side-to-side fashion.  This was performed with silk sutures.  I then performed 2 enterotomies, then performed a side-to-side anastomosis with a single firing with the GIA 75 stapler.  I then closed the opening with a TA-60 stapler.  The mesentery defect was closed with interrupted 3-0 silk sutures.  Then, the suture line was reinforced with interrupted 3-0 silk sutures.  I again ran the small bowel from the ligament of Treitz to the terminal ileum and found no other serosal injuries or enterotomies.  I then copiously irrigated the abdomen with multiple liters of normal saline. Hemostasis appeared to be achieved.  I then closed the patient's midline fascia with 2 separate running #1 looped PDS sutures.  The skin was then irrigated and packed with  wet-to-dry saline gauze.  I then excised the staple line at the colostomy and matured the colostomy circumferentially with interrupted 3-0 Vicryl sutures.  An ostomy appliance was then applied.  Gauze tapes were then applied to the midline wound.  At this point, all sponge, needle, and instrument counts were correct at the end of  procedure.  I then left the patient in the operating room as the Anesthesia was attempting to extubate the patient.  She will be taken postoperatively to the intensive care unit.     Coralie Keens, M.D.     DB/MEDQ  D:  08/01/2013  T:  08/02/2013  Job:  [id number]

## 2013-08-02 NOTE — Progress Notes (Signed)
Called by bedside RN, patient is decompensating with hypoxemic respiratory failure, SBP also dropped dramatically prior to intubation.  After examination of the entire case, patient likely aspirated, will intubate and mechanically ventilate, will hold esmolol and place TLC for neo.  Will follow CVP and monitor for now.  Additional CC time of 45 minutes.  Rush Farmer, M.D. [facility] Pulmonary/Critical Care Medicine. Pager: [phone]. After hours pager: [phone].

## 2013-08-02 NOTE — Progress Notes (Signed)
PULMONARY / CRITICAL CARE MEDICINE   Name: [name] MRN: [id number] DOB: 03-Oct-1940    ADMISSION DATE:  07/29/2013 CONSULTATION DATE:  08/01/2013  REFERRING MD :  Surgery PRIMARY SERVICE: PCCM  CHIEF COMPLAINT:  Bowel obstruction  BRIEF PATIENT DESCRIPTION: 73 y/o female with PMH CAD, HTN, Barrett's esophagus, presented to [facility] ED 7/10 with LLQ pain. Found to have bowel obstruction. To OR for repair 7/13. Remains of vent post-op. Consulted PCCM.  SIGNIFICANT EVENTS / STUDIES: 7/10 Admit, CT Abd - dilated prox colon w/ abrupt transition c/w with obstruction, diverticulitis vs colitis, Surgery c/s 7/12 Empiric Cipro/Flagyl, Cardiology consulted for pre-op evaluation, TPN per pharm 7/13 - Laparotomy, partial colectomy, colostomy, SB resection, repair of enterotomy, lysis of adhesions         - Hgb down to 6.8 >> imp to 10.8 s/p 2u PRBC 7/14 - Extubation, CXR stable, Hgb 9.5, o/n tachycardic >> Cards start Esmolol, IVF  LINES / TUBES: OETT 7/13 >>> 7/14 Colostomy 7/13 >>> PICC 7/12 >>> NGT 7/10 >>>  CULTURES: n/a  ANTIBIOTICS: Ceftriaxone 7/10 >>> Flagyl 7/10 >>> Fluconazole 7/10 >>>  SUBJECTIVE:  Sedated on ventilator, wakes up appropriately, without agitation. Updated family at bedside. Per nursing, significant tachycardia overnight with HR 130s. Soft Bp requiring fluid bolus  VITAL SIGNS: Temp:  [98.1 F (36.7 C)-100.5 F (38.1 C)] 98.1 F (36.7 C) (07/14 0840) Pulse Rate:  [99-128] 108 (07/14 1000) Resp:  [18-33] 27 (07/14 1000) BP: (67-142)/(31-77) 118/69 mmHg (07/14 1000) SpO2:  [96 %-100 %] 97 % (07/14 1000) FiO2 (%):  [0.4 %-60 %] 40 % (07/14 1000) HEMODYNAMICS:   VENTILATOR SETTINGS: Vent Mode:  [-] CPAP;PSV FiO2 (%):  [0.4 %-60 %] 40 % Set Rate:  [16 bmp-18 bmp] 18 bmp Vt Set:  [360 mL] 360 mL PEEP:  [5 cmH20] 5 cmH20 Pressure Support:  [5 cmH20] 5 cmH20 INTAKE / OUTPUT: Intake/Output     07/13 0701 - 07/14 0700 07/14 0701 - 07/15 0700   I.V.  (mL/kg) 3240 (46.3)    Blood 715.5    NG/GT     IV Piggyback 2100    TPN 1300.8    Total Intake(mL/kg) 7356.3 (105.2)    Urine (mL/kg/hr) 455 (0.3)    Emesis/NG output 50 (0)    Blood 350 (0.2)    Total Output 855     Net +6501.3            PHYSICAL EXAMINATION: General:  Sedated and on vent, no agitation or distress Neuro:  Wakes up appropriately, RASS -1 - follows commands.  HEENT:  [place]/AT, ETT in place.  Cardiovascular:  RRR, no murmurs Lungs:  CTAB anterior Abdomen:  Soft, abd dressing in place. LLQ colostomy appears healthy with viable tissue, with serosanguinous ostomy output, Absent bowel sounds Ext:  Warm, intact peripheral pulses +2 Skin:  areas of urticaria on chest and extremities, do not appear to be worsening  LABS:  CBC  Recent Labs Lab 08/01/13 1930 08/02/13 0118 08/02/13 0635  WBC 5.0 6.9 8.5  HGB 10.8* 10.0* 9.5*  HCT 32.7* 30.1* 29.4*  PLT 224 222 227   Coag's  Recent Labs Lab 08/01/13 1930  INR 1.25   BMET  Recent Labs Lab 08/01/13 0400 08/01/13 1643 08/01/13 1930 08/02/13 0118  NA 141 139 138 139  K 3.0* 3.6* 3.8 3.6*  CL 104  --  103 105  CO2 25  --  20 22  BUN 5*  --  7 9  CREATININE 0.42*  --  0.58 0.68  GLUCOSE 147* 164* 193* 208*   Electrolytes  Recent Labs Lab 07/29/13 2128  08/01/13 0400 08/01/13 1930 08/02/13 0118  CALCIUM  --   < > 8.1* 7.8* 7.7*  MG 1.8  --  2.0  --   --   PHOS  --   --  2.3  --   --   < > = values in this interval not displayed. Sepsis Markers  Recent Labs Lab 07/29/13 1624  LATICACIDVEN 0.86   ABG  Recent Labs Lab 08/01/13 1733 08/02/13 0330 08/02/13 0830  PHART 7.408 7.446 7.407  PCO2ART 33.8* 29.7* 32.6*  PO2ART 86.6 66.4* 63.0*   Liver Enzymes  Recent Labs Lab 07/29/13 1605 08/01/13 0400  AST 14 12  ALT 13 10  ALKPHOS 82 72  BILITOT 0.4 0.4  ALBUMIN 2.9* 2.3*   Cardiac Enzymes  Recent Labs Lab 07/29/13 1605  TROPONINI <0.30   Glucose  Recent Labs Lab  08/01/13 1023 08/01/13 1853 08/01/13 1937 08/01/13 2347 08/02/13 0419 08/02/13 0808  GLUCAP 104* 144* 190* 196* 179* 156*    Imaging - noted , no new infx   ASSESSMENT / PLAN:  PULMONARY A: Acute respiratory failure, in post-op sedation - extubation 7/14 P:   Extubation 7/14, demonstrated stable resp status on SBT, RSBI 56 (appropriately < 100) CXR stable 7/14, repeat in AM BDs PRN Pulmonary hygiene  CARDIOVASCULAR A:  Hemorraghic/hypovolemic shock >> Hgb 6.8 in PACU, 2u PRBC given 7/13 - Stable Hgb 9.5 Tachycardia, likely secondary to hypovolemia, post-op H/o CAD, HTN. P:  Increase IVF NS @ 75 cc/hr, may bolus PRN as needed for low BP CBC in AM Hold plavix, lipitor, ASA Cardiology following - start Esmolol for tachycardia, with eventual plan to resume Lopressor  RENAL A:   Hypokalemia P:   F/u and replace electrolytes as needed BMET in AM  GASTROINTESTINAL A:   Suspected colitis with colonic obstruction/SBO s/p partial colectomy with new colostomy 7/13 Hx of Barrett's esophagus Nutrition P:   Post op care, nutrition per surgery, remains on TPN Wound/ostomy care consult Continue PPI  HEMATOLOGIC A:   Acute blood loss anemia, post-op - Improved VTE ppx P:  SCD's Monitor CBC q 12 hrs x 2 days post-op Hold enoxaparin, asa, plavix  Transfuse for Hb < 7 or bleeding  INFECTIOUS A:   Colitis vs diverticulitis Candidal UTI P:   Antibiotics and micro as above Low-grade fever to 100.27F, f/u fever curve WBC stable  ENDOCRINE A:   Hyperglycemia, post-op No hx DM P:   SSI  NEUROLOGIC A:   Acute encephalopathy in post operative setting P:   RASS goal: -1 PRN fentanyl for sedation Restraints  DERMATOLOGY A: Urticaria >> family reports pt is prone to getting this - Stable P: PRN benadryl  GLOBAL: Extubation today, close monitoring of resp status post-extubation in ICU, monitor Hgb (currently stable), start Esmolol for tachycardia, inc IVF,  may bolus for hypotension, post-op care per Surgery on TPN. Husband updated in detail  Nobie Putnam, [place], PGY-2  Care during the described time interval was provided by me and/or other providers on the critical care team.  I have reviewed this patient's available data, including medical history, events of note, physical examination and test results as part of my evaluation  CC time x 35 m  ALVA,RAKESH V.  08/02/2013, 11:03 AM

## 2013-08-02 NOTE — Procedures (Signed)
Intubation Procedure Note [name] [id number] September 27, 1940  Procedure: Intubation Indications: Respiratory insufficiency  Procedure Details Consent: Unable to obtain consent because of emergent medical necessity. Time Out: Verified patient identification, verified procedure, site/side was marked, verified correct patient position, special equipment/implants available, medications/allergies/relevent history reviewed, required imaging and test results available.  Performed  Maximum sterile technique was used including antiseptics, gloves, hand hygiene and mask.  MAC    Evaluation Hemodynamic Status: BP stable throughout; O2 sats: stable throughout Patient's Current Condition: stable Complications: No apparent complications Patient did tolerate procedure well. Chest X-ray ordered to verify placement.  CXR: pending.   Jennet Maduro 08/02/2013

## 2013-08-02 NOTE — Progress Notes (Signed)
Patient ID: [name], female   DOB: 01/07/1941, 73 y.o.   MRN: [id number]   Subjective: Awake on vent.  Febrile, tachy, hypotensive. On atbx.  WBC normal today.    Objective:  Vital signs:  Filed Vitals:   08/02/13 0700 08/02/13 0715 08/02/13 0743 08/02/13 0840  BP: 92/55 112/61 88/55   Pulse: 124 123 127   Temp:    98.1 F (36.7 C)  TempSrc:    Axillary  Resp: _0 Height:      Weight:      SpO2: 98% 99% 98%     Last BM Date: 07/23/13  Intake/Output   Yesterday:  07/13 0701 - 07/14 0700 In: 7356.3 [I.V.:3240; Blood:715.5; IV Piggyback:2100; TPN:1300.8] Out: 855 [Urine:455; Emesis/NG output:50; Blood:350] This shift:    I/O last 3 completed shifts: In: 8215.5 [I.V.:3240; Blood:715.5; NG/GT:360; IV Piggyback:2100] Out: 855 [Urine:455; Emesis/NG output:50; Blood:350]    Physical Exam:  General: Pt awake on vent Abdomen: absent bowel sounds.  Abdomen is soft.  Blood noted on dressing, taken down, midline incision with skin open looks excellent, a wet to dry was reapplied.  LLQ colostomy is edematous, pink and viable.  There is serosanguinous output from ostomy.     Problem List:   Principal Problem:   Colonic obstruction Active Problems:   CAD (coronary artery disease)   Depression   Hypokalemia   Hypomagnesemia   Protein-calorie malnutrition, moderate    Results:   Labs: Results for orders placed during the hospital encounter of 07/29/13 (from the past 48 hour(s))  HEMOGLOBIN A1C     Status: None   Collection Time    07/31/13 10:45 AM      Result Value Ref Range   Hemoglobin A1C 5.5  <5.7 %   Comment: (NOTE)                                                                               According to the ADA Clinical Practice Recommendations for 2011, when     HbA1c is used as a screening test:      >=6.5%   Diagnostic of Diabetes Mellitus               (if abnormal result is confirmed)     5.7-6.4%   Increased risk of developing  Diabetes Mellitus     References:Diagnosis and Classification of Diabetes Mellitus,Diabetes     KZSW,1093,23(FTDDU 1):S62-S69 and Standards of Medical Care in             Diabetes - 2011,Diabetes Care,2011,34 (Suppl 1):S11-S61.   Mean Plasma Glucose 111  <117 mg/dL   Comment: Performed at [facility]     Status: Abnormal   Collection Time    07/31/13 10:45 AM      Result Value Ref Range   Sodium 141  137 - 147 mEq/L   Potassium 2.8 (*) 3.7 - 5.3 mEq/L   Comment: CRITICAL RESULT CALLED TO, READ BACK BY AND VERIFIED WITH:     LESARD R.,RN 07/31/13 1140 BY JONESJ   Chloride 102  96 - 112 mEq/L   CO2 24  19 - 32 mEq/L  Glucose, Bld 111 (*) 70 - 99 mg/dL   BUN 5 (*) 6 - 23 mg/dL   Creatinine, Ser 0.44 (*) 0.50 - 1.10 mg/dL   Calcium 8.2 (*) 8.4 - 10.5 mg/dL   GFR calc non Af Amer >90  >90 mL/min   GFR calc Af Amer >90  >90 mL/min   Comment: (NOTE)     The eGFR has been calculated using the CKD EPI equation.     This calculation has not been validated in all clinical situations.     eGFR's persistently <90 mL/min signify possible Chronic Kidney     Disease.   Anion gap 15  5 - 15  CBC     Status: Abnormal   Collection Time    07/31/13 10:45 AM      Result Value Ref Range   WBC 8.6  4.0 - 10.5 K/uL   RBC 3.55 (*) 3.87 - 5.11 MIL/uL   Hemoglobin 9.7 (*) 12.0 - 15.0 g/dL   HCT 30.4 (*) 36.0 - 46.0 %   MCV 85.6  78.0 - 100.0 fL   MCH 27.3  26.0 - 34.0 pg   MCHC 31.9  30.0 - 36.0 g/dL   RDW 15.6 (*) 11.5 - 15.5 %   Platelets 255  150 - 400 K/uL  GLUCOSE, CAPILLARY     Status: Abnormal   Collection Time    07/31/13 12:25 PM      Result Value Ref Range   Glucose-Capillary 103 (*) 70 - 99 mg/dL  GLUCOSE, CAPILLARY     Status: Abnormal   Collection Time    07/31/13  6:05 PM      Result Value Ref Range   Glucose-Capillary 114 (*) 70 - 99 mg/dL  SURGICAL PCR SCREEN     Status: None   Collection Time    07/31/13  8:55 PM      Result Value Ref Range    MRSA, PCR NEGATIVE  NEGATIVE   Staphylococcus aureus NEGATIVE  NEGATIVE   Comment:            The Xpert SA Assay (FDA     approved for NASAL specimens     in patients over 22 years of age),     is one component of     a comprehensive surveillance     program.  Test performance has     been validated by [employer] for patients greater     than or equal to 91 year old.     It is not intended     to diagnose infection nor to     guide or monitor treatment.  GLUCOSE, CAPILLARY     Status: Abnormal   Collection Time    08/01/13 12:00 AM      Result Value Ref Range   Glucose-Capillary 130 (*) 70 - 99 mg/dL  GLUCOSE, CAPILLARY     Status: Abnormal   Collection Time    08/01/13  1:00 AM      Result Value Ref Range   Glucose-Capillary 150 (*) 70 - 99 mg/dL  COMPREHENSIVE METABOLIC PANEL     Status: Abnormal   Collection Time    08/01/13  4:00 AM      Result Value Ref Range   Sodium 141  137 - 147 mEq/L   Potassium 3.0 (*) 3.7 - 5.3 mEq/L   Chloride 104  96 - 112 mEq/L   CO2 25  19 - 32 mEq/L  Glucose, Bld 147 (*) 70 - 99 mg/dL   BUN 5 (*) 6 - 23 mg/dL   Creatinine, Ser 0.42 (*) 0.50 - 1.10 mg/dL   Calcium 8.1 (*) 8.4 - 10.5 mg/dL   Total Protein 5.1 (*) 6.0 - 8.3 g/dL   Albumin 2.3 (*) 3.5 - 5.2 g/dL   AST 12  0 - 37 U/L   ALT 10  0 - 35 U/L   Alkaline Phosphatase 72  39 - 117 U/L   Total Bilirubin 0.4  0.3 - 1.2 mg/dL   GFR calc non Af Amer >90  >90 mL/min   GFR calc Af Amer >90  >90 mL/min   Comment: (NOTE)     The eGFR has been calculated using the CKD EPI equation.     This calculation has not been validated in all clinical situations.     eGFR's persistently <90 mL/min signify possible Chronic Kidney     Disease.   Anion gap 12  5 - 15  PREALBUMIN     Status: Abnormal   Collection Time    08/01/13  4:00 AM      Result Value Ref Range   Prealbumin 9.0 (*) 17.0 - 34.0 mg/dL   Comment: Performed at [facility]     Status: None    Collection Time    08/01/13  4:00 AM      Result Value Ref Range   Magnesium 2.0  1.5 - 2.5 mg/dL  PHOSPHORUS     Status: None   Collection Time    08/01/13  4:00 AM      Result Value Ref Range   Phosphorus 2.3  2.3 - 4.6 mg/dL  CBC     Status: Abnormal   Collection Time    08/01/13  4:00 AM      Result Value Ref Range   WBC 8.9  4.0 - 10.5 K/uL   RBC 3.41 (*) 3.87 - 5.11 MIL/uL   Hemoglobin 9.4 (*) 12.0 - 15.0 g/dL   HCT 29.3 (*) 36.0 - 46.0 %   MCV 85.9  78.0 - 100.0 fL   MCH 27.6  26.0 - 34.0 pg   MCHC 32.1  30.0 - 36.0 g/dL   RDW 15.8 (*) 11.5 - 15.5 %   Platelets 255  150 - 400 K/uL  DIFFERENTIAL     Status: Abnormal   Collection Time    08/01/13  4:00 AM      Result Value Ref Range   Neutrophils Relative % 80 (*) 43 - 77 %   Neutro Abs 7.2  1.7 - 7.7 K/uL   Lymphocytes Relative 13  12 - 46 %   Lymphs Abs 1.1  0.7 - 4.0 K/uL   Monocytes Relative 6  3 - 12 %   Monocytes Absolute 0.5  0.1 - 1.0 K/uL   Eosinophils Relative 1  0 - 5 %   Eosinophils Absolute 0.1  0.0 - 0.7 K/uL   Basophils Relative 0  0 - 1 %   Basophils Absolute 0.0  0.0 - 0.1 K/uL  TRIGLYCERIDES     Status: None   Collection Time    08/01/13  4:00 AM      Result Value Ref Range   Triglycerides 96  <150 mg/dL  GLUCOSE, CAPILLARY     Status: Abnormal   Collection Time    08/01/13  6:56 AM      Result Value Ref Range   Glucose-Capillary 164 (*) 70 -  99 mg/dL  GLUCOSE, CAPILLARY     Status: Abnormal   Collection Time    08/01/13 10:23 AM      Result Value Ref Range   Glucose-Capillary 104 (*) 70 - 99 mg/dL  PREPARE RBC (CROSSMATCH)     Status: None   Collection Time    08/01/13 11:35 AM      Result Value Ref Range   Order Confirmation ORDER PROCESSED BY BLOOD BANK    TYPE AND SCREEN     Status: None   Collection Time    08/01/13 11:35 AM      Result Value Ref Range   ABO/RH(D) B POS     Antibody Screen NEG     Sample Expiration 08/04/2013     Unit Number [id number]     Blood Component  Type RED CELLS,LR     Unit division 00     Status of Unit ISSUED,FINAL     Transfusion Status OK TO TRANSFUSE     Crossmatch Result Compatible     Unit Number [id number]     Blood Component Type RED CELLS,LR     Unit division 00     Status of Unit ISSUED,FINAL     Transfusion Status OK TO TRANSFUSE     Crossmatch Result Compatible    ABO/RH     Status: None   Collection Time    08/01/13 11:35 AM      Result Value Ref Range   ABO/RH(D) B POS    POCT I-STAT 4, (NA,K, GLUC, HGB,HCT)     Status: Abnormal   Collection Time    08/01/13  4:43 PM      Result Value Ref Range   Sodium 139  137 - 147 mEq/L   Potassium 3.6 (*) 3.7 - 5.3 mEq/L   Glucose, Bld 164 (*) 70 - 99 mg/dL   HCT 20.0 (*) 36.0 - 46.0 %   Hemoglobin 6.8 (*) 12.0 - 15.0 g/dL   Comment NOTIFIED PHYSICIAN    BLOOD GAS, ARTERIAL     Status: Abnormal   Collection Time    08/01/13  5:33 PM      Result Value Ref Range   FIO2 0.40     Delivery systems VENTILATOR     VT 360     Rate 16     Peep/cpap 5.0     pH, Arterial 7.408  7.350 - 7.450   pCO2 arterial 33.8 (*) 35.0 - 45.0 mmHg   pO2, Arterial 86.6  80.0 - 100.0 mmHg   Bicarbonate 20.9  20.0 - 24.0 mEq/L   TCO2 21.9  0 - 100 mmol/L   Acid-base deficit 3.0 (*) 0.0 - 2.0 mmol/L   O2 Saturation 98.2     Patient temperature 98.6     Collection site RIGHT RADIAL     Drawn by [id number]     Sample type ARTERIAL DRAW     Allens test (pass/fail) PASS  PASS  GLUCOSE, CAPILLARY     Status: Abnormal   Collection Time    08/01/13  6:53 PM      Result Value Ref Range   Glucose-Capillary 144 (*) 70 - 99 mg/dL  URINALYSIS, ROUTINE W REFLEX MICROSCOPIC     Status: Abnormal   Collection Time    08/01/13  7:10 PM      Result Value Ref Range   Color, Urine AMBER (*) YELLOW   Comment: BIOCHEMICALS MAY BE AFFECTED BY COLOR   APPearance CLOUDY (*) CLEAR  Specific Gravity, Urine 1.019  1.005 - 1.030   pH 6.0  5.0 - 8.0   Glucose, UA NEGATIVE  NEGATIVE mg/dL   Hgb urine  dipstick NEGATIVE  NEGATIVE   Bilirubin Urine NEGATIVE  NEGATIVE   Ketones, ur 15 (*) NEGATIVE mg/dL   Protein, ur 30 (*) NEGATIVE mg/dL   Urobilinogen, UA 0.2  0.0 - 1.0 mg/dL   Nitrite NEGATIVE  NEGATIVE   Leukocytes, UA MODERATE (*) NEGATIVE  URINE MICROSCOPIC-ADD ON     Status: Abnormal   Collection Time    08/01/13  7:10 PM      Result Value Ref Range   Squamous Epithelial / LPF RARE  RARE   WBC, UA 21-50  <3 WBC/hpf   Bacteria, UA FEW (*) RARE   Casts HYALINE CASTS (*) NEGATIVE   Comment: GRANULAR CAST   Urine-Other MUCOUS PRESENT    CBC     Status: Abnormal   Collection Time    08/01/13  7:30 PM      Result Value Ref Range   WBC 5.0  4.0 - 10.5 K/uL   RBC 3.72 (*) 3.87 - 5.11 MIL/uL   Hemoglobin 10.8 (*) 12.0 - 15.0 g/dL   Comment: POST TRANSFUSION SPECIMEN   HCT 32.7 (*) 36.0 - 46.0 %   MCV 87.9  78.0 - 100.0 fL   MCH 29.0  26.0 - 34.0 pg   MCHC 33.0  30.0 - 36.0 g/dL   RDW 14.6  11.5 - 15.5 %   Platelets 224  150 - 400 K/uL  BASIC METABOLIC PANEL     Status: Abnormal   Collection Time    08/01/13  7:30 PM      Result Value Ref Range   Sodium 138  137 - 147 mEq/L   Potassium 3.8  3.7 - 5.3 mEq/L   Chloride 103  96 - 112 mEq/L   CO2 20  19 - 32 mEq/L   Glucose, Bld 193 (*) 70 - 99 mg/dL   BUN 7  6 - 23 mg/dL   Creatinine, Ser 0.58  0.50 - 1.10 mg/dL   Calcium 7.8 (*) 8.4 - 10.5 mg/dL   GFR calc non Af Amer 89 (*) >90 mL/min   GFR calc Af Amer >90  >90 mL/min   Comment: (NOTE)     The eGFR has been calculated using the CKD EPI equation.     This calculation has not been validated in all clinical situations.     eGFR's persistently <90 mL/min signify possible Chronic Kidney     Disease.   Anion gap 15  5 - 15  PROTIME-INR     Status: Abnormal   Collection Time    08/01/13  7:30 PM      Result Value Ref Range   Prothrombin Time 15.7 (*) 11.6 - 15.2 seconds   INR 1.25  0.00 - 1.49  GLUCOSE, CAPILLARY     Status: Abnormal   Collection Time    08/01/13   7:37 PM      Result Value Ref Range   Glucose-Capillary 190 (*) 70 - 99 mg/dL   Comment 1 Notify RN    MRSA PCR SCREENING     Status: None   Collection Time    08/01/13  9:03 PM      Result Value Ref Range   MRSA by PCR NEGATIVE  NEGATIVE   Comment:            The GeneXpert MRSA Assay (FDA  approved for NASAL specimens     only), is one component of a     comprehensive MRSA colonization     surveillance program. It is not     intended to diagnose MRSA     infection nor to guide or     monitor treatment for     MRSA infections.  GLUCOSE, CAPILLARY     Status: Abnormal   Collection Time    08/01/13 11:47 PM      Result Value Ref Range   Glucose-Capillary 196 (*) 70 - 99 mg/dL   Comment 1 Documented in Chart     Comment 2 Notify RN    BASIC METABOLIC PANEL     Status: Abnormal   Collection Time    08/02/13  1:18 AM      Result Value Ref Range   Sodium 139  137 - 147 mEq/L   Potassium 3.6 (*) 3.7 - 5.3 mEq/L   Chloride 105  96 - 112 mEq/L   CO2 22  19 - 32 mEq/L   Glucose, Bld 208 (*) 70 - 99 mg/dL   BUN 9  6 - 23 mg/dL   Creatinine, Ser 0.68  0.50 - 1.10 mg/dL   Calcium 7.7 (*) 8.4 - 10.5 mg/dL   GFR calc non Af Amer 84 (*) >90 mL/min   GFR calc Af Amer >90  >90 mL/min   Comment: (NOTE)     The eGFR has been calculated using the CKD EPI equation.     This calculation has not been validated in all clinical situations.     eGFR's persistently <90 mL/min signify possible Chronic Kidney     Disease.   Anion gap 12  5 - 15  CBC     Status: Abnormal   Collection Time    08/02/13  1:18 AM      Result Value Ref Range   WBC 6.9  4.0 - 10.5 K/uL   RBC 3.53 (*) 3.87 - 5.11 MIL/uL   Hemoglobin 10.0 (*) 12.0 - 15.0 g/dL   HCT 30.1 (*) 36.0 - 46.0 %   MCV 85.3  78.0 - 100.0 fL   MCH 28.3  26.0 - 34.0 pg   MCHC 33.2  30.0 - 36.0 g/dL   RDW 14.7  11.5 - 15.5 %   Platelets 222  150 - 400 K/uL  BLOOD GAS, ARTERIAL     Status: Abnormal   Collection Time    08/02/13  3:30 AM       Result Value Ref Range   FIO2 0.40     Delivery systems VENTILATOR     Mode PRESSURE CONTROL     Rate 18     Peep/cpap 5.0     Pressure control 15     pH, Arterial 7.446  7.350 - 7.450   pCO2 arterial 29.7 (*) 35.0 - 45.0 mmHg   pO2, Arterial 66.4 (*) 80.0 - 100.0 mmHg   Bicarbonate 20.1  20.0 - 24.0 mEq/L   TCO2 21.0  0 - 100 mmol/L   Acid-base deficit 3.3 (*) 0.0 - 2.0 mmol/L   O2 Saturation 94.3     Patient temperature 98.6     Collection site LEFT RADIAL     Drawn by [id number]     Sample type ARTERIAL DRAW     Allens test (pass/fail) PASS  PASS  GLUCOSE, CAPILLARY     Status: Abnormal   Collection Time    08/02/13  4:19 AM  Result Value Ref Range   Glucose-Capillary 179 (*) 70 - 99 mg/dL   Comment 1 Documented in Chart     Comment 2 Notify RN    CBC     Status: Abnormal   Collection Time    08/02/13  6:35 AM      Result Value Ref Range   WBC 8.5  4.0 - 10.5 K/uL   RBC 3.45 (*) 3.87 - 5.11 MIL/uL   Hemoglobin 9.5 (*) 12.0 - 15.0 g/dL   HCT 29.4 (*) 36.0 - 46.0 %   MCV 85.2  78.0 - 100.0 fL   MCH 27.5  26.0 - 34.0 pg   MCHC 32.3  30.0 - 36.0 g/dL   RDW 15.1  11.5 - 15.5 %   Platelets 227  150 - 400 K/uL  GLUCOSE, CAPILLARY     Status: Abnormal   Collection Time    08/02/13  8:08 AM      Result Value Ref Range   Glucose-Capillary 156 (*) 70 - 99 mg/dL  POCT I-STAT 3, ART BLOOD GAS (G3+)     Status: Abnormal   Collection Time    08/02/13  8:30 AM      Result Value Ref Range   pH, Arterial 7.407  7.350 - 7.450   pCO2 arterial 32.6 (*) 35.0 - 45.0 mmHg   pO2, Arterial 63.0 (*) 80.0 - 100.0 mmHg   Bicarbonate 20.5  20.0 - 24.0 mEq/L   TCO2 22  0 - 100 mmol/L   O2 Saturation 92.0     Acid-base deficit 4.0 (*) 0.0 - 2.0 mmol/L   Patient temperature 98.1 F     Collection site RADIAL, ALLEN'S TEST ACCEPTABLE     Drawn by RT     Sample type ARTERIAL      Imaging / Studies: Portable Chest Xray In Am  08/02/2013   CLINICAL DATA:  Shortness of breath.  Respiratory failure. Ventilator.  EXAM: PORTABLE CHEST - 1 VIEW  COMPARISON:  08/01/2013  FINDINGS: Endotracheal tube is in good position the top of the aortic arch. NG tube tip is in the body of the stomach with side hole at the gastroesophageal junction. PICC tip is in the superior vena cava of of the cavoatrial junction.  There has been almost complete clearing of the infiltrate in the right upper lobe. There is consolidation and a small effusion at the left base, unchanged. Slight haziness persists at the right base.  Heart size is normal. Pulmonary vascularity is within normal limits.  IMPRESSION: 1. Almost complete clearing of the right upper lobe infiltrate. 2. Persistent consolidation small effusion at the left base.   Electronically Signed   By: Rozetta Nunnery M.D.   On: 08/02/2013 07:42   Portable Chest Xray  08/01/2013   CLINICAL DATA:  Endotracheal tube placement.  EXAM: PORTABLE CHEST - 1 VIEW  COMPARISON:  July 29, 2013.  FINDINGS: Stable cardiomegaly. Endotracheal tube is seen projected over tracheal air shadow with distal tip 5 cm above the carina. Right-sided PICC line is noted with distal tip in expected position of the SVC. No pneumothorax is noted. Right upper lobe opacity is noted concerning for pneumonia or edema. Mild left basilar opacity is noted concerning for pneumonia or atelectasis. Nasogastric tube is seen passing through the esophagus into the stomach.  IMPRESSION: Endotracheal tube in grossly good position. Increased right upper lobe opacity concerning for pneumonia or edema. Left basilar opacity is noted concerning for pneumonia or subsegmental atelectasis.  Electronically Signed   By: Sabino Dick M.D.   On: 08/01/2013 19:15   Dg Abd Portable 1v  07/31/2013   CLINICAL DATA:  NG tube placement  EXAM: PORTABLE ABDOMEN - 1 VIEW  COMPARISON:  07/29/2013  FINDINGS: NG tube extends into the body of the stomach with side port at the GE junction. Oral contrast within the colon from  recent CT exam. No evidence of bowel obstruction. There is dense left lower lobe atelectasis/infiltrate.  IMPRESSION: 1. NG tube in stomach.  Sideport at the GE junction. 2. No evidence of bowel obstruction. 3. Left lower lobe atelectasis/infiltrate   Electronically Signed   By: Suzy Bouchard M.D.   On: 07/31/2013 15:47    Scheduled Meds: . antiseptic oral rinse  15 mL Mouth Rinse QID  . cefTRIAXone (ROCEPHIN)  IV  2 g Intravenous Q24H  . chlorhexidine  15 mL Mouth Rinse BID  . esmolol  250 mcg/kg Intravenous Once  . fentaNYL  50 mcg Intravenous Once  . fluconazole (DIFLUCAN) IV  200 mg Intravenous Q24H  . insulin aspart  0-20 Units Subcutaneous 6 times per day  . lip balm  1 application Topical BID  . metronidazole  500 mg Intravenous Q8H  . pantoprazole (PROTONIX) IV  40 mg Intravenous Q24H  . potassium chloride  10 mEq Intravenous Q1 Hr x 3   Continuous Infusions: . esmolol    . Marland KitchenTPN (CLINIMIX-E) Adult 40 mL/hr at 08/01/13 2039   And  . fat emulsion 250 mL (08/01/13 2040)  . Marland KitchenTPN (CLINIMIX-E) Adult     And  . fat emulsion    . fentaNYL infusion INTRAVENOUS 100 mcg/hr (08/02/13 0700)  . phenylephrine (NEO-SYNEPHRINE) Adult infusion Stopped (08/01/13 1856)   PRN Meds:.acetaminophen, bisacodyl, diphenhydrAMINE, fentaNYL, fentaNYL, ipratropium-albuterol, ondansetron (ZOFRAN) IV, sodium chloride   Antibiotics: Anti-infectives   Start     Dose/Rate Route Frequency Ordered Stop   08/01/13 1245  metroNIDAZOLE (FLAGYL) IVPB 500 mg     500 mg 100 mL/hr over 60 Minutes Intravenous To Surgery 08/01/13 1238 08/01/13 1300   07/31/13 0800  cefoTEtan (CEFOTAN) 2 g in dextrose 5 % 50 mL IVPB     2 g 100 mL/hr over 30 Minutes Intravenous On call to O.R. 07/31/13 0741 08/01/13 0559   07/31/13 0800  cefTRIAXone (ROCEPHIN) 2 g in dextrose 5 % 50 mL IVPB    Comments:  Pharmacy may adjust dosing strength / duration / interval for maximal efficacy   2 g 100 mL/hr over 30 Minutes Intravenous  Every 24 hours 07/31/13 0744     07/29/13 2100  ciprofloxacin (CIPRO) IVPB 400 mg  Status:  Discontinued     400 mg 200 mL/hr over 60 Minutes Intravenous Every 12 hours 07/29/13 2058 07/31/13 0744   07/29/13 2100  metroNIDAZOLE (FLAGYL) IVPB 500 mg     500 mg 100 mL/hr over 60 Minutes Intravenous Every 8 hours 07/29/13 2058     07/29/13 2100  fluconazole (DIFLUCAN) IVPB 200 mg     200 mg 100 mL/hr over 60 Minutes Intravenous Every 24 hours 07/29/13 2058     07/29/13 1930  ciprofloxacin (CIPRO) IVPB 400 mg     400 mg 200 mL/hr over 60 Minutes Intravenous  Once 07/29/13 1915 07/29/13 2031   07/29/13 1930  metroNIDAZOLE (FLAGYL) IVPB 500 mg  Status:  Discontinued     500 mg 100 mL/hr over 60 Minutes Intravenous  Once 07/29/13 1915 07/29/13 2107      Assessment/Plan Colonic obstruction  Exploratory laparotomy, partial colectomy, end colostomy with hartman's procedure, SBP, repair of multiple enterotomies and LOA---Dr. Ninfa Linden 08/02/13 -continue with NGT to LWIS, she will need to be progressed very slowly -await pathology results -rocephin/flagyl -WOC consult PCM -TPN ARF -per CCM  Erby Pian, [facility] Pager [phone] Office [phone]  08/02/2013 9:19 AM

## 2013-08-02 NOTE — Progress Notes (Signed)
I have seen and examined the patient and agree with the assessment and plans. Appreciate CCM's care of [name]. Continuing NPO, NG.  On TNA.  Anticipate a prolonged ileus.  Lynzie Cliburn A. Ninfa Linden  MD, FACS

## 2013-08-02 NOTE — Progress Notes (Signed)
Wasted 150 mcg of fentenyl     Wasted with [employer] RN

## 2013-08-02 NOTE — Progress Notes (Signed)
1440 Dr. Johnsie Cancel paged pertaining to patients blood pressure 75/52 (57), 83/45 (54), 88/53 (62). Patient currently on Esmolol drip. 1445 Instructed to cut the drip in half to 12.5 and inform critical care physician of changes. If patients blood pressure continues to stay hypotensive continue care measures per critical care medicine with pressors or fluids.

## 2013-08-02 NOTE — Progress Notes (Signed)
At 0000 Spoke to [facility] MD about pts HR 115-125 and low urine output. 500 cc bolus given per MD order.  At 0330 spoke to [facility] MD again with concerns with HR 115-127 and low urine output. Another 500 cc bolus given per MD order. Will continue to monitor.

## 2013-08-02 NOTE — Procedures (Signed)
Extubation Procedure Note  Patient Details:   Name: [name] DOB: Aug 24, 1940 MRN: [id number]   Airway Documentation:     Evaluation  O2 sats: stable throughout Complications: No apparent complications Patient did tolerate procedure well. Bilateral Breath Sounds: Clear   Yes Patient extubated to [place]. No complications. Vital signs stable at this time. RN and husband at bedside. RT will continue to monitor.  Mcneil Sober 08/02/2013, 11:10 AM

## 2013-08-02 NOTE — Consult Note (Addendum)
WOC ostomy consult note Stoma type/location:  Pt with colostomy surgery performed yesterday to left lower quad Stomal assessment/size: Stoma red and viable, swollen and above skin level.  Assessed through bag and did not remove since surgery was performed yesterday. Pouch intact with good seal. Output Scant amt pink drainage in pouch.  No stool or flatus. Ostomy pouching: 2pc.  Education provided: Pt intubated and sedated.  Will begin teaching sessions when stable and out of ICU.  Supplies ordered to bedside for staff nurse use. Julien Girt MSN, RN, [place], [place], [place]

## 2013-08-02 NOTE — Care Management Note (Addendum)
    Page 1 of 2   08/26/2013     3:18:12 PM CARE MANAGEMENT NOTE 08/26/2013  Patient:  [name], [name]   Account Number:  [id number]  Date Initiated:  08/01/2013  Documentation initiated by:  Tomi Bamberger  Subjective/Objective Assessment:   dx abd pain     Action/Plan:   7/13- for surgery   Anticipated DC Date:     Anticipated DC Plan:    In-house referral  Clinical Social Worker      DC Planning Services  CM consult      Choice offered to / List presented to:             Status of service:  Completed, signed off Medicare Important Message given?  YES (If response is "NO", the following Medicare IM given date fields will be blank) Date Medicare IM given:  08/22/2013 Medicare IM given by:  Wilmary Levit Date Additional Medicare IM given:  08/25/2013 Additional Medicare IM given by:  [facility] Becki Mccaskill  Discharge Disposition:  [place]  Per UR Regulation:  Reviewed for med. necessity/level of care/duration of stay  If discussed at [place] of Stay Meetings, dates discussed:   08/04/2013  08/09/2013  08/11/2013  08/16/2013  08/18/2013  08/23/2013  08/25/2013    Comments:  Contact:  [place] Spouse [phone]  08/25/13 [place] MSN BSN CCM Pt decannulated after trach capped overnight.  Now on RA, sats 90s.  CSW following for transfer to SNF for rehab when medically stable.  SNFs require 24hr wait after decannulation before they will make [name] bed offer.  Spouse requesting [facility] or [facility].  08/23/13 [place] MSN BSN CCM Clinicals resubmitted, inpt rehab denied.  Talked with spouse about trach SNF - CSW aware.  Trach downsized 8/3.  08/17/13 [place] RN MSN BSN CCM Pt weaned to 35% > 28% TC.  [employer] denied inpt rehab stay, CSW notified that pt will need ST-SNF for rehab.  7/24 1355 debbie dowell rn,bsn pt had trach placed today. will get ltach to eval for elidg if needed.please see note below w  quidelines.  08-11-13 12:50am Luz Lex, RNBSN [phone] For trach tomorrow. Could be eligible for Ltach on 08-21-13 - if still on vent with trach and has 3 failed weans documented.  08-09-13 1:50pm Luz Lex, RNBSN [phone] Talked with husband at bedside.  Patient remains intubated. Husband tearful.  Plan per physician is weaning if fails - trach this week.  Checking to see if will qualify for Ltach with insurance.  08-08-13 3:45pm [place] [phone] Continues on vent. Started trickle feed - still on TNA  08-05-13 10:20am Luz Lex, [place] Remains on vent - ARDS.  On pressors.  08-03-13 [place], RNBSN [phone] On admission found to have bowel obstruction - to surgery for repain on 7-13.  Remained intubated post surgery - to ICU.  Extubated 7-14, but required reintubation on late 7-14.  Checking to see if has Ltach benefits.

## 2013-08-02 NOTE — Progress Notes (Signed)
eLink Physician-Brief Progress Note Patient Name: [name] DOB: 1941/01/05 MRN: [id number]  Date of Service  08/02/2013   HPI/Events of Note  Ventilator dyssynchrony. Pt is outstripping vent and consequently receiving little support   eICU Interventions  Changed to PCV mode - much improved   Intervention Category Major Interventions: Respiratory failure - evaluation and management  [name] 08/02/2013, 11:43 PM

## 2013-08-02 NOTE — Procedures (Signed)
Central Venous Catheter Insertion Procedure Note [name] [id number] 11-01-1940  Procedure: Insertion of Central Venous Catheter Indications: Assessment of intravascular volume, Drug and/or fluid administration and Frequent blood sampling  Procedure Details Consent: Unable to obtain consent because of emergent medical necessity. Time Out: Verified patient identification, verified procedure, site/side was marked, verified correct patient position, special equipment/implants available, medications/allergies/relevent history reviewed, required imaging and test results available.  Performed  Maximum sterile technique was used including antiseptics, cap, gloves, gown, hand hygiene, mask and sheet. Skin prep: Chlorhexidine; local anesthetic administered A antimicrobial bonded/coated triple lumen catheter was placed in the right internal jugular vein using the Seldinger technique.  U/S used in placement.  Evaluation Blood flow good Complications: No apparent complications Patient did tolerate procedure well. Chest X-ray ordered to verify placement.  CXR: pending.  [name] 08/02/2013, 4:21 PM

## 2013-08-02 NOTE — Progress Notes (Signed)
Patient ID: [name], female   DOB: [birth year], 73 y.o.   MRN: [id number]    Subjective:  Intubated Spoke with nurse and husband  Telemetry shows patient has been tachycardic all night HR 130's   Objective:  Filed Vitals:   08/02/13 0700 08/02/13 0715 08/02/13 0743 08/02/13 0840  BP: 92/55 112/61 88/55   Pulse: 124 123 127   Temp:    98.1 F (36.7 C)  TempSrc:    Axillary  Resp: 19 22 23    Height:      Weight:      SpO2: 98% 99% 98%     Intake/Output from previous day:  Intake/Output Summary (Last 24 hours) at 08/02/13 0910 Last data filed at 08/02/13 0700  Gross per 24 hour  Intake 7356.34 ml  Output    855 ml  Net 6501.34 ml    Physical Exam: Affect appropriate Frail elderly female HEENT: Intubated  Neck supple with no adenopathy JVP normal no bruits no thyromegaly Lungs clear with no wheezing and good diaphragmatic motion Heart:  S1/S2 no murmur, no rub, gallop or click PMI normal Abdomen: S/P colon resection with ostomy no bruit.  No HSM or HJR Distal pulses intact with no bruits No edema Neuro non-focal Skin warm and dry    Lab Results: Basic Metabolic Panel:  Recent Labs  07/31/13 1045 08/01/13 0400  08/01/13 1930 08/02/13 0118  NA 141 141  < > 138 139  K 2.8* 3.0*  < > 3.8 3.6*  CL 102 104  --  103 105  CO2 24 25  --  20 22  GLUCOSE 111* 147*  < > 193* 208*  BUN 5* 5*  --  7 9  CREATININE 0.44* 0.42*  --  0.58 0.68  CALCIUM 8.2* 8.1*  --  7.8* 7.7*  MG  --  2.0  --   --   --   PHOS  --  2.3  --   --   --   < > = values in this interval not displayed. Liver Function Tests:  Recent Labs  08/01/13 0400  AST 12  ALT 10  ALKPHOS 72  BILITOT 0.4  PROT 5.1*  ALBUMIN 2.3*   CBC:  Recent Labs  08/01/13 0400  08/02/13 0118 08/02/13 0635  WBC 8.9  < > 6.9 8.5  NEUTROABS 7.2  --   --   --   HGB 9.4*  < > 10.0* 9.5*  HCT 29.3*  < > 30.1* 29.4*  MCV 85.9  < > 85.3 85.2  PLT 255  < > 222 227  < > = values in this interval  not displayed. Hemoglobin A1C:  Recent Labs  07/31/13 1045  HGBA1C 5.5   Fasting Lipid Panel:  Recent Labs  08/01/13 0400  TRIG 96    Imaging: Portable Chest Xray In Am  08/02/2013   CLINICAL DATA:  Shortness of breath. Respiratory failure. Ventilator.  EXAM: PORTABLE CHEST - 1 VIEW  COMPARISON:  08/01/2013  FINDINGS: Endotracheal tube is in good position the top of the aortic arch. NG tube tip is in the body of the stomach with side hole at the gastroesophageal junction. PICC tip is in the superior vena cava of of the cavoatrial junction.  There has been almost complete clearing of the infiltrate in the right upper lobe. There is consolidation and a small effusion at the left base, unchanged. Slight haziness persists at the right base.  Heart size is normal. Pulmonary vascularity is  within normal limits.  IMPRESSION: 1. Almost complete clearing of the right upper lobe infiltrate. 2. Persistent consolidation small effusion at the left base.   Electronically Signed   By: Rozetta Nunnery M.D.   On: 08/02/2013 07:42   Portable Chest Xray  08/01/2013   CLINICAL DATA:  Endotracheal tube placement.  EXAM: PORTABLE CHEST - 1 VIEW  COMPARISON:  July 29, 2013.  FINDINGS: Stable cardiomegaly. Endotracheal tube is seen projected over tracheal air shadow with distal tip 5 cm above the carina. Right-sided PICC line is noted with distal tip in expected position of the SVC. No pneumothorax is noted. Right upper lobe opacity is noted concerning for pneumonia or edema. Mild left basilar opacity is noted concerning for pneumonia or atelectasis. Nasogastric tube is seen passing through the esophagus into the stomach.  IMPRESSION: Endotracheal tube in grossly good position. Increased right upper lobe opacity concerning for pneumonia or edema. Left basilar opacity is noted concerning for pneumonia or subsegmental atelectasis.   Electronically Signed   By: Sabino Dick M.D.   On: 08/01/2013 19:15   Dg Abd Portable  1v  07/31/2013   CLINICAL DATA:  NG tube placement  EXAM: PORTABLE ABDOMEN - 1 VIEW  COMPARISON:  07/29/2013  FINDINGS: NG tube extends into the body of the stomach with side port at the GE junction. Oral contrast within the colon from recent CT exam. No evidence of bowel obstruction. There is dense left lower lobe atelectasis/infiltrate.  IMPRESSION: 1. NG tube in stomach.  Sideport at the GE junction. 2. No evidence of bowel obstruction. 3. Left lower lobe atelectasis/infiltrate   Electronically Signed   By: Suzy Bouchard M.D.   On: 07/31/2013 15:47    Cardiac Studies:  ECG:  ST rate 130  Normal    Telemetry:  ST  130's  Echo:   Medications:   . antiseptic oral rinse  15 mL Mouth Rinse QID  . cefTRIAXone (ROCEPHIN)  IV  2 g Intravenous Q24H  . chlorhexidine  15 mL Mouth Rinse BID  . esmolol  500 mcg/kg Intravenous Once  . fentaNYL  50 mcg Intravenous Once  . fluconazole (DIFLUCAN) IV  200 mg Intravenous Q24H  . insulin aspart  0-20 Units Subcutaneous 6 times per day  . lip balm  1 application Topical BID  . metronidazole  500 mg Intravenous Q8H  . pantoprazole (PROTONIX) IV  40 mg Intravenous Q24H  . potassium chloride  10 mEq Intravenous Q1 Hr x 3     . esmolol    . Marland KitchenTPN (CLINIMIX-E) Adult 40 mL/hr at 08/01/13 2039   And  . fat emulsion 250 mL (08/01/13 2040)  . Marland KitchenTPN (CLINIMIX-E) Adult     And  . fat emulsion    . fentaNYL infusion INTRAVENOUS 100 mcg/hr (08/02/13 0700)  . phenylephrine (NEO-SYNEPHRINE) Adult infusion Stopped (08/01/13 1856)    Assessment/Plan:  CAD:  Discussed with Dr Elsworth Soho and nurse  [facility] to give more fluid for soft BP  Use Esmolol with small initial bolus to control HR and decrease risk of periop MI Had DES stents to RCA/Circumflex 12/14  Also has not gotten ASA PR  And discussed this with nurse  Hct stable   Esmolol will be easier to titrate than  intermitant lopressor which was not given at all last night.  Short half life also beneficial if hypotension  becomes and issue   Jenkins Rouge 08/02/2013, 9:10 AM

## 2013-08-02 NOTE — Progress Notes (Signed)
PARENTERAL NUTRITION CONSULT NOTE - Follow-up  Pharmacy Consult for TPN Indication: Bowel Obstruction  Allergies  Allergen Reactions  . Pregabalin Swelling    Tongue swelling  . Sulfonamide Derivatives Swelling    Childhood reaction   Patient Measurements: Height: 5' (152.4 cm) Weight: 154 lb 3.2 oz (69.945 kg) IBW/kg (Calculated) : 45.5  Vital Signs: Temp: 100.5 F (38.1 C) (07/14 0425) Temp src: Oral (07/14 0425) BP: 88/55 mmHg (07/14 0743) Pulse Rate: 127 (07/14 0743) Intake/Output from previous day: 07/13 0701 - 07/14 0700 In: 7356.3 [I.V.:3240; Blood:715.5; IV Piggyback:2100; TPN:1300.8] Out: 855 [Urine:455; Emesis/NG output:50; Blood:350] Intake/Output from this shift:  Labs:  Recent Labs  08/01/13 1930 08/02/13 0118 08/02/13 0635  WBC 5.0 6.9 8.5  HGB 10.8* 10.0* 9.5*  HCT 32.7* 30.1* 29.4*  PLT 224 222 227  INR 1.25  --   --     Recent Labs  08/01/13 0400 08/01/13 1643 08/01/13 1930 08/02/13 0118  NA 141 139 138 139  K 3.0* 3.6* 3.8 3.6*  CL 104  --  103 105  CO2 25  --  20 22  GLUCOSE 147* 164* 193* 208*  BUN 5*  --  7 9  CREATININE 0.42*  --  0.58 0.68  CALCIUM 8.1*  --  7.8* 7.7*  MG 2.0  --   --   --   PHOS 2.3  --   --   --   PROT 5.1*  --   --   --   ALBUMIN 2.3*  --   --   --   AST 12  --   --   --   ALT 10  --   --   --   ALKPHOS 72  --   --   --   BILITOT 0.4  --   --   --   PREALBUMIN 9.0*  --   --   --   TRIG 96  --   --   --    Estimated Creatinine Clearance: 53.9 ml/min (by C-G formula based on Cr of 0.68).   CBG (last 3)   Recent Labs  08/01/13 1937 08/01/13 2347 08/02/13 0419  GLUCAP 190* 196* 179*    Insulin Requirements in the past 24 hours:  12 units Novolog SSI  Current Nutrition:  Clinimix E 5/15 at 40 ml/hr and 20% lipids at 10 ml/hr provides 48 gm protein and 1162 kcal.  Nutritional Goals:  1750-1900 kcal, 80-95 gm protein (per RD note 7/13)  Assessment: Admit: 73 yo female admitted with c/o  abdominal pain associated with N&V.  She has been admitted to [facility] multiple times over the past few months with a similar complaint.  Of note, past surgical history reveals an appendectomy, cholecystectomy, colon resection and an abdominal hysterectomy.  GI: Pt with prolonged obstruction and expect prolonged post-op ileus. S/p exp lap/LOA/partial colectomy/end-colostomy, small bowel resection, repair of multiple enterotomies 7/13. NPO with TPN. Baseline prealbumin 9 (low).  Endo: No h/o DM. CBGs elevated post-op. Resistant SSI ordered post-op.  Lytes: K slightly low at 3.6. Corrected Ca 9 (low alb 2.3). Other lytes ok.    Renal: SCr stable, pt making urine - UOP not recorded accurately.   Pulm: Intubated post-op  Cards: CAD, HTN. BP low-nl (off pressors). HR 120s (ST).  Hepatobil: LFTs wnl. TG 96.  Neuro: sedated with fent gtt. h/o depression. Sertraline PTA  ID: Rocephin/Flagyl for intraabd process. Also on diflucan for candidal UTI.  Best Practices: Lovenox, PPI IV (  home med)  Home meds: Will need resumed once pt able to take po  TPN Access: Double lumen PICC placed 7/12  TPN day#: 2  Plan:  1.  Continue Clinimix E 5/15 at rate of 55ml/hr and 20% lipids at 10 ml/hr with plans to advance to goal as tolerated. Will not advance today with hyperglycemia. 2.  Continue resistant SSI coverage and will add 15 units regular insulin to TPN bag 3.  KCl 25mEq/50ml x 3 4.  F/u BMET, Mg, Phos in a.m.  Sherlon Handing, PharmD, BCPS Clinical pharmacist, pager [phone] 08/02/2013,8:04 AM

## 2013-08-03 ENCOUNTER — Inpatient Hospital Stay (HOSPITAL_COMMUNITY): Payer: Medicare HMO

## 2013-08-03 DIAGNOSIS — I517 Cardiomegaly: Secondary | ICD-10-CM

## 2013-08-03 DIAGNOSIS — R6521 Severe sepsis with septic shock: Secondary | ICD-10-CM

## 2013-08-03 DIAGNOSIS — A419 Sepsis, unspecified organism: Secondary | ICD-10-CM

## 2013-08-03 LAB — GLUCOSE, CAPILLARY
GLUCOSE-CAPILLARY: 132 mg/dL — AB (ref 70–99)
Glucose-Capillary: 128 mg/dL — ABNORMAL HIGH (ref 70–99)
Glucose-Capillary: 158 mg/dL — ABNORMAL HIGH (ref 70–99)
Glucose-Capillary: 209 mg/dL — ABNORMAL HIGH (ref 70–99)
Glucose-Capillary: 222 mg/dL — ABNORMAL HIGH (ref 70–99)
Glucose-Capillary: 227 mg/dL — ABNORMAL HIGH (ref 70–99)

## 2013-08-03 LAB — CBC
HEMATOCRIT: 25.8 % — AB (ref 36.0–46.0)
HEMATOCRIT: 25.5 % — AB (ref 36.0–46.0)
Hemoglobin: 8.2 g/dL — ABNORMAL LOW (ref 12.0–15.0)
Hemoglobin: 8.2 g/dL — ABNORMAL LOW (ref 12.0–15.0)
MCH: 28 pg (ref 26.0–34.0)
MCH: 28.3 pg (ref 26.0–34.0)
MCHC: 31.8 g/dL (ref 30.0–36.0)
MCHC: 32.2 g/dL (ref 30.0–36.0)
MCV: 88.1 fL (ref 78.0–100.0)
MCV: 87.9 fL (ref 78.0–100.0)
Platelets: 264 10*3/uL (ref 150–400)
Platelets: 279 10*3/uL (ref 150–400)
RBC: 2.93 MIL/uL — ABNORMAL LOW (ref 3.87–5.11)
RBC: 2.9 MIL/uL — AB (ref 3.87–5.11)
RDW: 15.7 % — AB (ref 11.5–15.5)
RDW: 15.9 % — ABNORMAL HIGH (ref 11.5–15.5)
WBC: 14.2 10*3/uL — ABNORMAL HIGH (ref 4.0–10.5)
WBC: 16.7 10*3/uL — AB (ref 4.0–10.5)

## 2013-08-03 LAB — BLOOD GAS, ARTERIAL
ACID-BASE DEFICIT: 5.1 mmol/L — AB (ref 0.0–2.0)
Bicarbonate: 19.9 mEq/L — ABNORMAL LOW (ref 20.0–24.0)
DRAWN BY: 39866
FIO2: 0.8 %
LHR: 16 {breaths}/min
O2 SAT: 89.6 %
PEEP: 8 cmH2O
PO2 ART: 62.9 mmHg — AB (ref 80.0–100.0)
Patient temperature: 101.1
Pressure control: 10 cmH2O
TCO2: 21.1 mmol/L (ref 0–100)
pCO2 arterial: 42.3 mmHg (ref 35.0–45.0)
pH, Arterial: 7.303 — ABNORMAL LOW (ref 7.350–7.450)

## 2013-08-03 LAB — URINALYSIS, ROUTINE W REFLEX MICROSCOPIC
GLUCOSE, UA: NEGATIVE mg/dL
Ketones, ur: 15 mg/dL — AB
Nitrite: POSITIVE — AB
PROTEIN: 100 mg/dL — AB
Specific Gravity, Urine: 1.025 (ref 1.005–1.030)
UROBILINOGEN UA: 1 mg/dL (ref 0.0–1.0)
pH: 5.5 (ref 5.0–8.0)

## 2013-08-03 LAB — BASIC METABOLIC PANEL
ANION GAP: 14 (ref 5–15)
BUN: 19 mg/dL (ref 6–23)
CHLORIDE: 103 meq/L (ref 96–112)
CO2: 20 mEq/L (ref 19–32)
CREATININE: 0.91 mg/dL (ref 0.50–1.10)
Calcium: 8 mg/dL — ABNORMAL LOW (ref 8.4–10.5)
GFR calc Af Amer: 70 mL/min — ABNORMAL LOW (ref >90)
GFR calc non Af Amer: 61 mL/min — ABNORMAL LOW (ref >90)
GLUCOSE: 121 mg/dL — AB (ref 70–99)
Potassium: 3.6 mEq/L — ABNORMAL LOW (ref 3.7–5.3)
Sodium: 137 mEq/L (ref 137–147)

## 2013-08-03 LAB — PROCALCITONIN: Procalcitonin: 1.83 ng/mL

## 2013-08-03 LAB — PHOSPHORUS: Phosphorus: 2.5 mg/dL (ref 2.3–4.6)

## 2013-08-03 LAB — URINE MICROSCOPIC-ADD ON

## 2013-08-03 LAB — MAGNESIUM: Magnesium: 1.5 mg/dL (ref 1.5–2.5)

## 2013-08-03 LAB — PRO B NATRIURETIC PEPTIDE: PRO B NATRI PEPTIDE: 3782 pg/mL — AB (ref 0–125)

## 2013-08-03 LAB — TROPONIN I

## 2013-08-03 MED ORDER — SODIUM CHLORIDE 0.9 % IV SOLN
0.0300 [IU]/min | INTRAVENOUS | Status: DC
  Administered 2013-08-03 – 2013-08-04 (×2): 0.03 [IU]/min via INTRAVENOUS
  Filled 2013-08-03 (×3): qty 2.5

## 2013-08-03 MED ORDER — POTASSIUM CHLORIDE 10 MEQ/50ML IV SOLN
10.0000 meq | INTRAVENOUS | Status: AC
  Administered 2013-08-03 (×4): 10 meq via INTRAVENOUS
  Filled 2013-08-03 (×4): qty 50

## 2013-08-03 MED ORDER — FAT EMULSION 20 % IV EMUL
250.0000 mL | INTRAVENOUS | Status: AC
  Administered 2013-08-03: 250 mL via INTRAVENOUS
  Filled 2013-08-03: qty 250

## 2013-08-03 MED ORDER — TRACE MINERALS CR-CU-F-FE-I-MN-MO-SE-ZN IV SOLN
INTRAVENOUS | Status: AC
  Administered 2013-08-03: 17:00:00 via INTRAVENOUS
  Filled 2013-08-03: qty 2000

## 2013-08-03 MED ORDER — SODIUM CHLORIDE 0.9 % IV SOLN: INTRAVENOUS | Status: DC

## 2013-08-03 MED ORDER — VANCOMYCIN HCL IN DEXTROSE 750-5 MG/150ML-% IV SOLN
750.0000 mg | Freq: Two times a day (BID) | INTRAVENOUS | Status: DC
  Administered 2013-08-03 – 2013-08-04 (×2): 750 mg via INTRAVENOUS
  Filled 2013-08-03 (×3): qty 150

## 2013-08-03 MED ORDER — MAGNESIUM SULFATE 40 MG/ML IJ SOLN
2.0000 g | Freq: Once | INTRAMUSCULAR | Status: AC
  Administered 2013-08-03: 2 g via INTRAVENOUS
  Filled 2013-08-03: qty 50

## 2013-08-03 MED ORDER — PIPERACILLIN-TAZOBACTAM 3.375 G IVPB
3.3750 g | Freq: Three times a day (TID) | INTRAVENOUS | Status: DC
  Administered 2013-08-03 – 2013-08-07 (×13): 3.375 g via INTRAVENOUS
  Filled 2013-08-03 (×15): qty 50

## 2013-08-03 MED ORDER — VANCOMYCIN HCL 10 G IV SOLR
1250.0000 mg | Freq: Once | INTRAVENOUS | Status: AC
  Administered 2013-08-03: 1250 mg via INTRAVENOUS
  Filled 2013-08-03: qty 1250

## 2013-08-03 MED ORDER — HYDROCORTISONE NA SUCCINATE PF 100 MG IJ SOLR
50.0000 mg | Freq: Four times a day (QID) | INTRAMUSCULAR | Status: DC
  Administered 2013-08-03 – 2013-08-08 (×21): 50 mg via INTRAVENOUS
  Filled 2013-08-03 (×25): qty 1

## 2013-08-03 NOTE — Progress Notes (Signed)
ANTIBIOTIC CONSULT NOTE - INITIAL  Pharmacy Consult for Vancomycin and Zosyn  Indication: rule out sepsis  Allergies  Allergen Reactions  . Pregabalin Swelling    Tongue swelling  . Sulfonamide Derivatives Swelling    Childhood reaction    Patient Measurements: Height: 5' (152.4 cm) Weight: 154 lb 3.2 oz (69.945 kg) IBW/kg (Calculated) : 45.5  Vital Signs: Temp: 101.1 F (38.4 C) (07/15 0427) Temp src: Oral (07/15 0427) BP: 119/53 mmHg (07/15 0344) Pulse Rate: 107 (07/15 0344) Intake/Output from previous day: 07/14 0701 - 07/15 0700 In: 2253.1 [I.V.:843.1; NG/GT:20; IV Piggyback:500; TPN:890] Out: 460 [Urine:410; Stool:50] Intake/Output from this shift: Total I/O In: 1190.7 [I.V.:690.7; IV Piggyback:200; TPN:300] Out: 400 [Urine:350; Stool:50]  Labs:  Recent Labs  08/01/13 0400  08/01/13 1930 08/02/13 0118 08/02/13 0635 08/02/13 1924  WBC 8.9  --  5.0 6.9 8.5 11.9*  HGB 9.4*  < > 10.8* 10.0* 9.5* 8.9*  PLT 255  --  224 222 227 240  CREATININE 0.42*  --  0.58 0.68  --   --   < > = values in this interval not displayed. Estimated Creatinine Clearance: 53.9 ml/min (by C-G formula based on Cr of 0.68). No results found for this basename: VANCOTROUGH, Corlis Leak, VANCORANDOM, GENTTROUGH, GENTPEAK, GENTRANDOM, TOBRATROUGH, TOBRAPEAK, TOBRARND, AMIKACINPEAK, AMIKACINTROU, AMIKACIN,  in the last 72 hours   Microbiology: Recent Results (from the past 720 hour(s))  SURGICAL PCR SCREEN     Status: None   Collection Time    07/31/13  8:55 PM      Result Value Ref Range Status   MRSA, PCR NEGATIVE  NEGATIVE Final   Staphylococcus aureus NEGATIVE  NEGATIVE Final   Comment:            The Xpert SA Assay (FDA     approved for NASAL specimens     in patients over 33 years of age),     is one component of     a comprehensive surveillance     program.  Test performance has     been validated by [employer] for patients greater     than or equal to 38 year old.   It is not intended     to diagnose infection nor to     guide or monitor treatment.  MRSA PCR SCREENING     Status: None   Collection Time    08/01/13  9:03 PM      Result Value Ref Range Status   MRSA by PCR NEGATIVE  NEGATIVE Final   Comment:            The GeneXpert MRSA Assay (FDA     approved for NASAL specimens     only), is one component of a     comprehensive MRSA colonization     surveillance program. It is not     intended to diagnose MRSA     infection nor to guide or     monitor treatment for     MRSA infections.   Assessment: 73 yo female with fevers, possible sepsis, for empiric antibiotics  Goal of Therapy:  Vancomycin trough level 15-20 mcg/ml  Plan:  Vancomycin 1250 mg IV now, then 750 mg IV q12h Zosyn 3.375 g IV q8h   Caryl Pina 08/03/2013,4:34 AM

## 2013-08-03 NOTE — Progress Notes (Signed)
NUTRITION FOLLOW UP  Intervention:    Continue TPN dosing per Pharmacy, increase rate as tolerated to meet nutrition needs as able.   Nutrition Dx:   Inadequate oral intake related to altered GI function as evidenced by need for bowel rest. Ongoing.  Goal:   Intake to meet >90% of estimated nutrition needs. Progressing.  Monitor:   Weight trend, lab trends, TPN provision, advancement to diet vs enteral nutrition  Assessment:   PMHx significant for diverticulitis, CAD, HTN, depression. Admitted with abdominal pain and c/n x 2 weeks. Hx of lysis of adhesions and ovarian cystectomy for SBO in April 2015. Work-up reveals colonic obstruction proximal to descending colon, MD suspects etiology of ischemia vs malignancy.  S/P laparotomy, partial colectomy, colostomy, SB resection, repair of enterotomy, lysis of adhesions on 7/13. Was able to extubate on 7/14, but required re-intubation the same day.   Patient is receiving TPN with Clinimix E 5/15 increasing to 60 ml/hr and lipids @ 10 ml/hr. Provides 1680 ml, 1502 kcal, and 72 grams protein per day. Meets 85% of estimated energy needs and 79% of minimum estimated protein needs.  Patient is currently intubated on ventilator support MV: 15.9 L/min Temp (24hrs), Avg:99.4 F (37.4 C), Min:98.3 F (36.8 C), Max:101.1 F (38.4 C)   Height: Ht Readings from Last 1 Encounters:  08/02/13 5' (1.524 m)    Ideal weight: 45.5 kg   Weight Status:   Wt Readings from Last 1 Encounters:  07/29/13 154 lb 3.2 oz (69.945 kg)    Re-estimated needs:  Kcal: 1775 Protein: >/= 91 gm Fluid: 2 L  Skin: abdominal incision  Diet Order: NPO   Intake/Output Summary (Last 24 hours) at 08/03/13 1122 Last data filed at 08/03/13 1000  Gross per 24 hour  Intake 5024.83 ml  Output    460 ml  Net 4564.83 ml    Last BM: 7/4   Labs:   Recent Labs Lab 07/29/13 2128  07/31/13 1045 08/01/13 0400  08/01/13 1930 08/02/13 0118 08/03/13 0400  NA   --   < > 141 141  < > 138 139 137  K  --   < > 2.8* 3.0*  < > 3.8 3.6* 3.6*  CL  --   < > 102 104  --  103 105 103  CO2  --   < > 24 25  --  20 22 20   BUN  --   < > 5* 5*  --  7 9 19   CREATININE 0.48*  < > 0.44* 0.42*  --  0.58 0.68 0.91  CALCIUM  --   < > 8.2* 8.1*  --  7.8* 7.7* 8.0*  MG 1.8  --   --  2.0  --   --   --  1.5  PHOS  --   --   --  2.3  --   --   --  2.5  GLUCOSE  --   < > 111* 147*  < > 193* 208* 121*  < > = values in this interval not displayed.  CBG (last 3)   Recent Labs  08/02/13 2344 08/03/13 0322 08/03/13 0804  GLUCAP 169* 132* 128*    Scheduled Meds: . antiseptic oral rinse  15 mL Mouth Rinse QID  . chlorhexidine  15 mL Mouth Rinse BID  . fluconazole (DIFLUCAN) IV  200 mg Intravenous Q24H  . hydrocortisone sod succinate (SOLU-CORTEF) inj  50 mg Intravenous Q6H  . insulin aspart  0-20 Units Subcutaneous 6  times per day  . lip balm  1 application Topical BID  . magnesium sulfate 1 - 4 g bolus IVPB  2 g Intravenous Once  . pantoprazole (PROTONIX) IV  40 mg Intravenous Q24H  . piperacillin-tazobactam (ZOSYN)  IV  3.375 g Intravenous 3 times per day  . potassium chloride  10 mEq Intravenous Q1 Hr x 4  . vancomycin  750 mg Intravenous Q12H    Continuous Infusions: . sodium chloride 10 mL/hr at 08/03/13 0900  . Marland KitchenTPN (CLINIMIX-E) Adult 40 mL/hr at 08/02/13 1721   And  . fat emulsion 10 kcal (08/02/13 2000)  . Marland KitchenTPN (CLINIMIX-E) Adult     And  . fat emulsion    . fentaNYL infusion INTRAVENOUS 150 mcg/hr (08/03/13 0830)  . phenylephrine (NEO-SYNEPHRINE) Adult infusion 200 mcg/min (08/03/13 1040)  . vasopressin (PITRESSIN) infusion - *FOR SHOCK* 0.03 Units/min (08/03/13 0835)    Molli Barrows, RD, LDN, [place] Pager [phone] After Hours Pager [phone]

## 2013-08-03 NOTE — Progress Notes (Signed)
eLink Physician-Brief Progress Note Patient Name: [name] DOB: December 16, 1940 MRN: [id number]  Date of Service  08/03/2013   HPI/Events of Note  Fever.  Increasing O2 reqts Concern for hosp acq infection  eICU Interventions  Urine, resp and blood  cx  Broaden abx coverage from ceftrx/metronudazole to V/Z AM CXR already ordered PRN acetaminophen already ordered   Intervention Category Major Interventions: Sepsis - evaluation and management  Merton Border 08/03/2013, 4:06 AM

## 2013-08-03 NOTE — Progress Notes (Signed)
UR Completed.  [name] .tele 08/03/2013

## 2013-08-03 NOTE — Progress Notes (Signed)
2 Days Post-Op  Subjective: Required re-intubation late yesterday Now sedated   Objective: Vital signs in last 24 hours: Temp:  [98.1 F (36.7 C)-101.1 F (38.4 C)] 101.1 F (38.4 C) (07/15 0427) Pulse Rate:  [101-146] 107 (07/15 0750) Resp:  [18-44] 21 (07/15 0715) BP: (53-174)/(31-81) 107/33 mmHg (07/15 0750) SpO2:  [82 %-100 %] 94 % (07/15 0750) FiO2 (%):  [40 %-100 %] 90 % (07/15 0750) Last BM Date: 07/23/13  Intake/Output from previous day: 07/14 0701 - 07/15 0700 In: 4855.6 [I.V.:2845.6; NG/GT:20; IV Piggyback:800; TPN:1190] Out: 520 [Urine:460; Emesis/NG output:10; Stool:50] Intake/Output this shift:    Abdomen soft, ostomy pink, edematous Midline wound clean, fascia intact  Lab Results:   Recent Labs  08/02/13 1924 08/03/13 0647  WBC 11.9* 14.2*  HGB 8.9* 8.2*  HCT 26.9* 25.8*  PLT 240 264   BMET  Recent Labs  08/02/13 0118 08/03/13 0400  NA 139 137  K 3.6* 3.6*  CL 105 103  CO2 22 20  GLUCOSE 208* 121*  BUN 9 19  CREATININE 0.68 0.91  CALCIUM 7.7* 8.0*   PT/INR  Recent Labs  08/01/13 1930  LABPROT 15.7*  INR 1.25   ABG  Recent Labs  08/02/13 1736 08/03/13 0422  PHART 7.196* 7.303*  HCO3 22.3 19.9*    Studies/Results: Dg Chest Port 1 View  08/03/2013   CLINICAL DATA:  Respiratory failure.  EXAM: PORTABLE CHEST - 1 VIEW  COMPARISON:  08/02/2013  FINDINGS: Endotracheal tube remains with the tip approximately 3 cm above the carina. Nasogastric tube extends into the proximal stomach. Central line and additional PICC line positioning are stable. Lungs show slight interval worsening of bilateral pulmonary airspace disease and edema. No significant pleural effusions are identified. No pneumothorax is seen. The heart size is stable.  IMPRESSION: Worsening of bilateral pulmonary airspace disease and edema.   Electronically Signed   By: Aletta Edouard M.D.   On: 08/03/2013 07:49   Dg Chest Port 1 View  08/02/2013   CLINICAL DATA:  Check  endotracheal tube position  EXAM: PORTABLE CHEST - 1 VIEW  COMPARISON:  08/02/2013  FINDINGS: An endotracheal tube is again identified just above the aorta at approximately 5 cm above the carina. Is relatively stable in appearance from the prior exam. A right-sided PICC line is noted in the mid superior vena cava. A right jugular central line is noted in the distal superior vena cava. A nasogastric catheter is noted extending towards the stomach. The proximal side port of the nasogastric catheter lies within the distal esophagus. The cardiac shadow is stable. Diffuse vascular congestion with interstitial edema is noted consistent with increasing CHF. No bony abnormality is noted.  IMPRESSION: Tubes and lines as described above.  Increasing vascular congestion and edema consistent with CHF.   Electronically Signed   By: Inez Catalina M.D.   On: 08/02/2013 17:13   Portable Chest Xray In Am  08/02/2013   CLINICAL DATA:  Shortness of breath. Respiratory failure. Ventilator.  EXAM: PORTABLE CHEST - 1 VIEW  COMPARISON:  08/01/2013  FINDINGS: Endotracheal tube is in good position the top of the aortic arch. NG tube tip is in the body of the stomach with side hole at the gastroesophageal junction. PICC tip is in the superior vena cava of of the cavoatrial junction.  There has been almost complete clearing of the infiltrate in the right upper lobe. There is consolidation and a small effusion at the left base, unchanged. Slight haziness persists at the  right base.  Heart size is normal. Pulmonary vascularity is within normal limits.  IMPRESSION: 1. Almost complete clearing of the right upper lobe infiltrate. 2. Persistent consolidation small effusion at the left base.   Electronically Signed   By: Rozetta Nunnery M.D.   On: 08/02/2013 07:42   Portable Chest Xray  08/01/2013   CLINICAL DATA:  Endotracheal tube placement.  EXAM: PORTABLE CHEST - 1 VIEW  COMPARISON:  July 29, 2013.  FINDINGS: Stable cardiomegaly. Endotracheal  tube is seen projected over tracheal air shadow with distal tip 5 cm above the carina. Right-sided PICC line is noted with distal tip in expected position of the SVC. No pneumothorax is noted. Right upper lobe opacity is noted concerning for pneumonia or edema. Mild left basilar opacity is noted concerning for pneumonia or atelectasis. Nasogastric tube is seen passing through the esophagus into the stomach.  IMPRESSION: Endotracheal tube in grossly good position. Increased right upper lobe opacity concerning for pneumonia or edema. Left basilar opacity is noted concerning for pneumonia or subsegmental atelectasis.   Electronically Signed   By: Sabino Dick M.D.   On: 08/01/2013 19:15   Dg Abd Portable 1v  08/02/2013   CLINICAL DATA:  Abdominal distention  EXAM: PORTABLE ABDOMEN - 1 VIEW  COMPARISON:  07/31/2013  FINDINGS: Nonspecific but nonobstructive bowel gas pattern.  Left lower quadrant ostomy.  Enteric tube terminates in the gastric body.  Cholecystectomy clips in the right upper abdomen.  IMPRESSION: Nonobstructive bowel gas pattern.  Left lower quadrant ostomy.   Electronically Signed   By: Julian Hy M.D.   On: 08/02/2013 17:05    Anti-infectives: Anti-infectives   Start     Dose/Rate Route Frequency Ordered Stop   08/03/13 1800  vancomycin (VANCOCIN) IVPB 750 mg/150 ml premix     750 mg 150 mL/hr over 60 Minutes Intravenous Every 12 hours 08/03/13 0440     08/03/13 0500  vancomycin (VANCOCIN) 1,250 mg in sodium chloride 0.9 % 250 mL IVPB     1,250 mg 166.7 mL/hr over 90 Minutes Intravenous  Once 08/03/13 0440 08/03/13 0646   08/03/13 0500  piperacillin-tazobactam (ZOSYN) IVPB 3.375 g     3.375 g 12.5 mL/hr over 240 Minutes Intravenous 3 times per day 08/03/13 0440     08/01/13 1245  metroNIDAZOLE (FLAGYL) IVPB 500 mg     500 mg 100 mL/hr over 60 Minutes Intravenous To Surgery 08/01/13 1238 08/01/13 1300   07/31/13 0800  cefoTEtan (CEFOTAN) 2 g in dextrose 5 % 50 mL IVPB     2  g 100 mL/hr over 30 Minutes Intravenous On call to O.R. 07/31/13 0741 08/01/13 0559   07/31/13 0800  cefTRIAXone (ROCEPHIN) 2 g in dextrose 5 % 50 mL IVPB  Status:  Discontinued    Comments:  Pharmacy may adjust dosing strength / duration / interval for maximal efficacy   2 g 100 mL/hr over 30 Minutes Intravenous Every 24 hours 07/31/13 0744 08/03/13 0405   07/29/13 2100  ciprofloxacin (CIPRO) IVPB 400 mg  Status:  Discontinued     400 mg 200 mL/hr over 60 Minutes Intravenous Every 12 hours 07/29/13 2058 07/31/13 0744   07/29/13 2100  metroNIDAZOLE (FLAGYL) IVPB 500 mg  Status:  Discontinued     500 mg 100 mL/hr over 60 Minutes Intravenous Every 8 hours 07/29/13 2058 08/03/13 0405   07/29/13 2100  fluconazole (DIFLUCAN) IVPB 200 mg     200 mg 100 mL/hr over 60 Minutes Intravenous Every 24  hours 07/29/13 2058     07/29/13 1930  ciprofloxacin (CIPRO) IVPB 400 mg     400 mg 200 mL/hr over 60 Minutes Intravenous  Once 07/29/13 1915 07/29/13 2031   07/29/13 1930  metroNIDAZOLE (FLAGYL) IVPB 500 mg  Status:  Discontinued     500 mg 100 mL/hr over 60 Minutes Intravenous  Once 07/29/13 1915 07/29/13 2107      Assessment/Plan: s/p Procedure(s): EXPLORATORY LAPAROTOMY (N/A) PARTIAL COLECTOMY (N/A) COLOSTOMY (N/A) SMALL BOWEL RESECTION (N/A)  Respiratory failure post op Post op ileus Malnutrition  Continuing TNA, NPO, NG Wet to dry dressing changes  LOS: 5 days    Jeyren Danowski A 08/03/2013

## 2013-08-03 NOTE — Progress Notes (Signed)
PULMONARY / CRITICAL CARE MEDICINE   Name: [name] MRN: [id number] DOB: Apr 26, 1940    ADMISSION DATE:  07/29/2013 CONSULTATION DATE:  08/01/2013  REFERRING MD :  Surgery PRIMARY SERVICE: PCCM  CHIEF COMPLAINT:  Bowel obstruction  BRIEF PATIENT DESCRIPTION: 73 y/o female with PMH CAD, HTN, Barrett's esophagus, presented to [facility] ED 7/10 with LLQ pain. Found to have bowel obstruction. To OR for repair 7/13. Remains of vent post-op. Consulted PCCM.  SIGNIFICANT EVENTS / STUDIES: 7/10 Admit, CT Abd - dilated prox colon w/ abrupt transition c/w with obstruction, diverticulitis vs colitis, Surgery c/s 7/12 Empiric Cipro/Flagyl, Cardiology consulted for pre-op evaluation, TPN per pharm 7/13 - Laparotomy, partial colectomy, colostomy, SB resection, repair of enterotomy, lysis of adhesions         - Hgb down to 6.8 >> imp to 10.8 s/p 2u PRBC 7/14 - Extubation, CXR stable, Hgb 9.5, o/n tachycardic >> Cards start Esmolol, IVF         - o/n required re-intubation due to desat, decompsenate, hypoxemic resp failure, hypotensive - concern aspiration         - DC Esmolol, start Vaso, switch to Vanc / Zosyn 7/15 - CXR (worsening bilateral airspace disease, suspect edema), concern for volume overload despite CVP 5-6         - Add Vaso, Trop (neg), BNP 3782, PCT 1.83, ECHO  LINES / TUBES: #1 OETT 7/13 >>> 7/14 #2 OETT 7/14 >>>  Colostomy 7/13 >>> PICC 7/12 >>> NGT 7/10 >>>  CULTURES: Blood x 2, 7/15 >>> Respiratory 7/15 >>  ANTIBIOTICS: Ceftriaxone 7/10 >>> 7/14 Flagyl 7/10 >>> 7/14 Vancomycin 7/14 >>> Zosyn 7/14 >>> Fluconazole 7/10 >>>  SUBJECTIVE:  Required reintubation yesterday. Per nursing remained tachycardic overnight. Sedated on vent, concern for volume overload. Family at bedside.  Thick secretions suctioned with some desat to 88%.  VITAL SIGNS: Temp:  [98.3 F (36.8 C)-101.1 F (38.4 C)] 99.8 F (37.7 C) (07/15 0828) Pulse Rate:  [101-146] 102 (07/15 1115) Resp:   [18-44] 20 (07/15 1115) BP: (53-174)/(25-81) 108/49 mmHg (07/15 1115) SpO2:  [82 %-100 %] 96 % (07/15 1115) FiO2 (%):  [60 %-100 %] 100 % (07/15 1100) HEMODYNAMICS: CVP:  [5 mmHg-6 mmHg] 6 mmHg VENTILATOR SETTINGS: Vent Mode:  [-] PCV FiO2 (%):  [60 %-100 %] 100 % Set Rate:  [16 bmp-22 bmp] 16 bmp Vt Set:  [400 mL] 400 mL PEEP:  [5 cmH20-8 cmH20] 8 cmH20 Plateau Pressure:  [18 cmH20-23 cmH20] 23 cmH20 INTAKE / OUTPUT: Intake/Output     07/14 0701 - 07/15 0700 07/15 0701 - 07/16 0700   I.V. (mL/kg) 2845.6 (40.7) 517.2 (7.4)   Blood     NG/GT 20 20   IV Piggyback 800 100   TPN 1190 200   Total Intake(mL/kg) 4855.6 (69.4) 837.2 (12)   Urine (mL/kg/hr) 460 (0.3)    Emesis/NG output 10 (0)    Stool 50 (0)    Blood     Total Output 520     Net +4335.6 +837.2          PHYSICAL EXAMINATION: General:  Sedated and on vent, no agitation or distress Neuro:  RASS -3, sedated, not following commands HEENT:  [place]/AT, ETT in place.  Cardiovascular:  Tachycardic, regular rhythm, no murmurs Lungs:  Coarse bilateral breath sounds Abdomen:  Soft, abd dressing in place. LLQ colostomy appears healthy with viable tissue, diminished BS Ext:  Warm, intact peripheral pulses +2, upper ext R>L edema Skin:  areas of  urticaria on chest and extremities (lower), stable  LABS:  CBC  Recent Labs Lab 08/02/13 0635 08/02/13 1924 08/03/13 0647  WBC 8.5 11.9* 14.2*  HGB 9.5* 8.9* 8.2*  HCT 29.4* 26.9* 25.8*  PLT 227 240 264   Coag's  Recent Labs Lab 08/01/13 1930  INR 1.25   BMET  Recent Labs Lab 08/01/13 1930 08/02/13 0118 08/03/13 0400  NA 138 139 137  K 3.8 3.6* 3.6*  CL 103 105 103  CO2 20 22 20   BUN 7 9 19   CREATININE 0.58 0.68 0.91  GLUCOSE 193* 208* 121*   Electrolytes  Recent Labs Lab 07/29/13 2128  08/01/13 0400 08/01/13 1930 08/02/13 0118 08/03/13 0400  CALCIUM  --   < > 8.1* 7.8* 7.7* 8.0*  MG 1.8  --  2.0  --   --  1.5  PHOS  --   --  2.3  --   --  2.5  <  > = values in this interval not displayed. Sepsis Markers  Recent Labs Lab 07/29/13 1624 08/03/13 0930  LATICACIDVEN 0.86  --   PROCALCITON  --  1.83   ABG  Recent Labs Lab 08/02/13 0830 08/02/13 1736 08/03/13 0422  PHART 7.407 7.196* 7.303*  PCO2ART 32.6* 57.5* 42.3  PO2ART 63.0* 155.0* 62.9*   Liver Enzymes  Recent Labs Lab 07/29/13 1605 08/01/13 0400  AST 14 12  ALT 13 10  ALKPHOS 82 72  BILITOT 0.4 0.4  ALBUMIN 2.9* 2.3*   Cardiac Enzymes  Recent Labs Lab 07/29/13 1605 08/03/13 0930  TROPONINI <0.30 <0.30  PROBNP  --  3782.0*   Glucose  Recent Labs Lab 08/02/13 1203 08/02/13 1737 08/02/13 1929 08/02/13 2344 08/03/13 0322 08/03/13 0804  GLUCAP 159* 159* 161* 169* 132* 128*    Imaging - noted   ASSESSMENT / PLAN:  PULMONARY A: Acute respiratory failure, in post-op sedation - s/p re-intubation 7/14 following extubation with hypoxemic failure Pulmonary Edema - 7/15 CXR worsening, not consistent with focal aspiration PNA Concern ARDS, if rule out Cardiogenic source of Pulmonary Edema Concern CAP, unlikely aspiration PNA P:   Full mech vent support, ARDS protocol for PEEP/FIO2 titration Monitor O2 sats, tolerate >88%, note thick secretions on suctioning with desat If persistent desat < 88%, would consider trial Lasix to reduce pulm edema (caution with low BP) BDs PRN Pulmonary hygiene Repeat CXR in AM  CARDIOVASCULAR A:  Shock, Hemorraghic/hypovolemic - Improved MAP on pressors , CVP 5-6 Tachycardia, likely secondary to hypovolemia, post-op H/o CAD, HTN. P:  Continue pressor support with Neo, start Vaso today Discontinue Esmolol KVO IVF, still receiving significant IVF through IV drips Cardiac work-up with: Troponin (neg x 1), elevated BNP 3782, ordered ECHO - r/o cardiac cause of pulm edema CBC q 12 Hold plavix, lipitor, ASA Cardiology following  RENAL A:   Non-gap Metabolic Acidosis -  Hypokalemia - K 3.6 P:   F/u and  replace electrolytes as needed BMET in AM  GASTROINTESTINAL A:   Suspected colitis with colonic obstruction/SBO s/p partial colectomy with new colostomy 7/13 Hx of Barrett's esophagus Nutrition P:   Post op care, nutrition per surgery, remains on TPN Wound/ostomy care consult Continue PPI  HEMATOLOGIC A:   Acute blood loss anemia, post-op - Gradual decreasing Hgb to 8.2 VTE ppx P:  SCD's Monitor CBC q 12 hrs x 2 days post-op Hold enoxaparin, asa, plavix  Transfuse for Hb < 7 or bleeding  INFECTIOUS A:   Colitis vs diverticulitis, s/p partial colectomy Concern  CAP, unlikely aspiration PNA - fever to 101.1, low PCT 1.83, inc WBC 14 Candidal UTI P:   Antibiotics and micro as above F/u fever curve Trend PCT If resp culture without staph, consider DC Vanc  ENDOCRINE A: Shock, requiring stress dose steroids   Hyperglycemia, post-op No hx DM P:   Solucortef 50mg  IV q 6 hr SSI  NEUROLOGIC A:   Acute encephalopathy in post operative setting P:   RASS goal: -1 PRN fentanyl for sedation Restraints  DERMATOLOGY A: Urticaria >> suspect may be related to opiates - Stable P: Monitor Steroids may improve PRN benadryl  GLOBAL: Monitor in ICU, remains intubated (s/p reintubation 7/14), monitor low BPs (CVP 5-6) given pulm edema have decreased IVF, would try Lasix if desat < 88%, currently switch to ARDS protocol vent, r/o cardiac etiology (pending ECHO, note BNP elevated), post-op care per Surgery on TPN. Husband & daughters updated in detail -guarded prognosis given  Nobie Putnam, [place], PGY-2  Care during the described time interval was provided by me and/or other providers on the critical care team.  I have reviewed this patient's available data, including medical history, events of note, physical examination and test results as part of my evaluation  CC time x 3m  ALVA,RAKESH V.MD   08/03/2013, 11:38 AM

## 2013-08-03 NOTE — Progress Notes (Signed)
[facility] ADULT ICU REPLACEMENT PROTOCOL FOR AM LAB REPLACEMENT ONLY  The patient does not apply for the [facility] Adult ICU Electrolyte Replacment Protocol based on the criteria listed below:     Is urine output >/= 0.5 ml/kg/hr for the last 6 hours? No. Patient's UOP is 0.4 ml/kg/hr  Abnormal electrolyte(s): K 3.6   If a panic level lab has been reported, has the CCM MD in charge been notified? Yes.  .   Physician:  Elder Cyphers, MD  Vear Clock 08/03/2013 5:34 AM

## 2013-08-03 NOTE — Progress Notes (Signed)
  Echocardiogram 2D Echocardiogram has been performed.  [name] 08/03/2013, 2:36 PM

## 2013-08-03 NOTE — Progress Notes (Signed)
PARENTERAL NUTRITION CONSULT NOTE - Follow-up  Pharmacy Consult for TPN Indication: Bowel Obstruction  Allergies  Allergen Reactions  . Pregabalin Swelling    Tongue swelling  . Sulfonamide Derivatives Swelling    Childhood reaction   Patient Measurements: Height: 5' (152.4 cm) Weight: 154 lb 3.2 oz (69.945 kg) IBW/kg (Calculated) : 45.5  Vital Signs: Temp: 101.1 F (38.4 C) (07/15 0427) Temp src: Oral (07/15 0800) BP: 107/38 mmHg (07/15 0800) Pulse Rate: 107 (07/15 0800) Intake/Output from previous day: 07/14 0701 - 07/15 0700 In: 4855.6 [I.V.:2845.6; NG/GT:20; IV Piggyback:800; TPN:1190] Out: 5 [Urine:460; Emesis/NG output:10; Stool:50] Intake/Output from this shift:  Labs:  Recent Labs  08/01/13 1930  08/02/13 0635 08/02/13 1924 08/03/13 0647  WBC 5.0  < > 8.5 11.9* 14.2*  HGB 10.8*  < > 9.5* 8.9* 8.2*  HCT 32.7*  < > 29.4* 26.9* 25.8*  PLT 224  < > 227 240 264  INR 1.25  --   --   --   --   < > = values in this interval not displayed.  Recent Labs  08/01/13 0400  08/01/13 1930 08/02/13 0118 08/03/13 0400  NA 141  < > 138 139 137  K 3.0*  < > 3.8 3.6* 3.6*  CL 104  --  103 105 103  CO2 25  --  20 22 20   GLUCOSE 147*  < > 193* 208* 121*  BUN 5*  --  7 9 19   CREATININE 0.42*  --  0.58 0.68 0.91  CALCIUM 8.1*  --  7.8* 7.7* 8.0*  MG 2.0  --   --   --  1.5  PHOS 2.3  --   --   --  2.5  PROT 5.1*  --   --   --   --   ALBUMIN 2.3*  --   --   --   --   AST 12  --   --   --   --   ALT 10  --   --   --   --   ALKPHOS 72  --   --   --   --   BILITOT 0.4  --   --   --   --   PREALBUMIN 9.0*  --   --   --   --   TRIG 96  --   --   --   --   < > = values in this interval not displayed. Estimated Creatinine Clearance: 47.3 ml/min (by C-G formula based on Cr of 0.91).   CBG (last 3)   Recent Labs  08/02/13 1929 08/02/13 2344 08/03/13 0322  GLUCAP 161* 169* 132*    Insulin Requirements in the past 24 hours:  22 units Novolog SSI  Current  Nutrition:  Clinimix E 5/15 at 40 ml/hr and 20% lipids at 10 ml/hr provides 48 gm protein and 1162 kcal.  Nutritional Goals:  1750-1900 kcal, 80-95 gm protein (per RD note 7/13)  Assessment: Admit: 73 yo female admitted with c/o abdominal pain associated with N&V.  She has been admitted to [facility] multiple times over the past few months with a similar complaint.  Of note, past surgical history reveals an appendectomy, cholecystectomy, colon resection and an abdominal hysterectomy.  GI: Pt with prolonged obstruction and expect prolonged post-op ileus. S/p exp lap/LOA/partial colectomy/end-colostomy, small bowel resection, repair of multiple enterotomies 7/13. NPO with TPN. Baseline prealbumin 9 (low).  Endo: No h/o DM. CBGs elevated post-op. Pt on  resistant SSI. CBGs <180 past 24 hrs.  Lytes: K remains slightly low at 3.6. Corrected Ca 9.4 (low alb 2.3). Mg 1.5. Phos remains wnl.   Renal: SCr stable, UOP 0.3 ml/kg/hr. MIVF: NS at 75 ml/hr.  Pulm: Extubated post-op then required reintubation later the same night 7/14.  Cards: CAD, HTN. Requiring phenylephrine at 200 mcg/minl; adding vasopressin. HR 101-146 (ST).  Hepatobil: LFTs wnl. TG 96.  Neuro: sedated with fent gtt. GCS 9. h/o depression. Sertraline PTA  ID: Abx broadened to Vanc/Zosyn for sepsis. Also on diflucan for candidal UTI. Wbc up to 14.2.   Best Practices: Lovenox, PPI IV (home med)  Home meds: Will need resumed once pt able to take po  TPN Access: Double lumen PICC placed 7/12  TPN day#: 3  Plan:  1.  Increase Clinimix E 5/15 to 78ml/hr and continue 20% lipids at 10 ml/hr with plans to advance to goal as tolerated. Will advance to goal slowly. 2. Will decrease MIVF to 55 ml/hr when new TPN bag hangs. 3.  Continue resistant SSI coverage and increase insulin in regular TPN bag to 20 units/L (total of 40 units in 2L bag) 4. Magnesium sulfate 2gm IV  5.  KCl 10 mEq/9ml x 4 6.  F/u TPN labs in a.m.  Sherlon Handing, PharmD, BCPS Clinical pharmacist, pager [phone] 08/03/2013,8:14 AM

## 2013-08-03 NOTE — Progress Notes (Signed)
[facility] MD notified that HR greater than 120 per verbal order. No new orders received at this time. Will continue to monitor.

## 2013-08-03 NOTE — Procedures (Signed)
Arterial Catheter Insertion Procedure Note [name] [id number] 08-30-1940  Procedure: Insertion of Arterial Catheter  Indications: Blood pressure monitoring and Frequent blood sampling  Procedure Details Consent: Risks of procedure as well as the alternatives and risks of each were explained to the (patient/caregiver).  Consent for procedure obtained. and Unable to obtain consent because of emergent medical necessity. Time Out: Verified patient identification, verified procedure, site/side was marked, verified correct patient position, special equipment/implants available, medications/allergies/relevent history reviewed, required imaging and test results available.  Performed  Maximum sterile technique was used including antiseptics, cap, gloves, gown, hand hygiene, mask and sheet. Skin prep: Chlorhexidine; local anesthetic administered 22 gauge catheter was inserted into left radial artery using the Seldinger technique.  Evaluation Blood flow good; BP tracing good. Complications: No apparent complications.   Ulice Dash 08/03/2013

## 2013-08-03 NOTE — Consult Note (Addendum)
WOC ostomy follow up CCS team following for assessment and plan of care to abd wound. Stoma type/location:  colostomy to left lower quad from 07/13 surgery Stomal assessment/size: 2 1/4 inch, pink, viable, above skin, swollen Peristomal assessment: skin intact with stiches present Treatment options for stomal/peristomal skin: cleaned skin with moist guaze and placed barrier ring to decrease potential leakage Output : 20cc of serosanguinous liquid, no stool of flatus Ostomy pouching: 2pc. barrier ring and pouch  Education provided: Pt husband educated on ostomy care and daily care.  Demonstrated understanding through teach back  Will need reinforcement. Supplies at bedside for staff nurses. Larene Beach, RN, BSN, MSN-Student Julien Girt MSN, RN, [place], [place], [place]

## 2013-08-03 NOTE — Progress Notes (Signed)
Patient ID: [name], female   DOB: 09/21/1940, 73 y.o.   MRN: [id number]    Subjective:  Events last night noted  Intubated sedated Echo pending for EF    Objective:  Filed Vitals:   08/03/13 1223 08/03/13 1230 08/03/13 1245 08/03/13 1300  BP: 119/41 130/65 124/43 125/54  Pulse: 107 102 99 95  Temp:      TempSrc:    Oral  Resp:  18 17 17   Height:      Weight:      SpO2: 95% 98% 95% 97%    Intake/Output from previous day:  Intake/Output Summary (Last 24 hours) at 08/03/13 1357 Last data filed at 08/03/13 1304  Gross per 24 hour  Intake 5466.43 ml  Output    460 ml  Net 5006.43 ml    Physical Exam: Affect appropriate Frail elderly female HEENT: Intubated  Neck supple with no adenopathy JVP normal no bruits no thyromegaly Lungs clear with no wheezing and good diaphragmatic motion Heart:  S1/S2 no murmur, no rub, gallop or click PMI normal Abdomen: S/P colon resection with ostomy no bruit.  No HSM or HJR Distal pulses intact with no bruits No edema Neuro non-focal Skin warm and dry    Lab Results: Basic Metabolic Panel:  Recent Labs  08/01/13 0400  08/02/13 0118 08/03/13 0400  NA 141  < > 139 137  K 3.0*  < > 3.6* 3.6*  CL 104  < > 105 103  CO2 25  < > 22 20  GLUCOSE 147*  < > 208* 121*  BUN 5*  < > 9 19  CREATININE 0.42*  < > 0.68 0.91  CALCIUM 8.1*  < > 7.7* 8.0*  MG 2.0  --   --  1.5  PHOS 2.3  --   --  2.5  < > = values in this interval not displayed. Liver Function Tests:  Recent Labs  08/01/13 0400  AST 12  ALT 10  ALKPHOS 72  BILITOT 0.4  PROT 5.1*  ALBUMIN 2.3*   CBC:  Recent Labs  08/01/13 0400  08/02/13 1924 08/03/13 0647  WBC 8.9  < > 11.9* 14.2*  NEUTROABS 7.2  --   --   --   HGB 9.4*  < > 8.9* 8.2*  HCT 29.3*  < > 26.9* 25.8*  MCV 85.9  < > 85.7 88.1  PLT 255  < > 240 264  < > = values in this interval not displayed. Hemoglobin A1C: No results found for this basename: HGBA1C,  in the last 72  hours Fasting Lipid Panel:  Recent Labs  08/01/13 0400  TRIG 96    Imaging: Dg Chest Port 1 View  08/03/2013   CLINICAL DATA:  Respiratory failure.  EXAM: PORTABLE CHEST - 1 VIEW  COMPARISON:  08/02/2013  FINDINGS: Endotracheal tube remains with the tip approximately 3 cm above the carina. Nasogastric tube extends into the proximal stomach. Central line and additional PICC line positioning are stable. Lungs show slight interval worsening of bilateral pulmonary airspace disease and edema. No significant pleural effusions are identified. No pneumothorax is seen. The heart size is stable.  IMPRESSION: Worsening of bilateral pulmonary airspace disease and edema.   Electronically Signed   By: Aletta Edouard M.D.   On: 08/03/2013 07:49   Dg Chest Port 1 View  08/02/2013   CLINICAL DATA:  Check endotracheal tube position  EXAM: PORTABLE CHEST - 1 VIEW  COMPARISON:  08/02/2013  FINDINGS: An endotracheal  tube is again identified just above the aorta at approximately 5 cm above the carina. Is relatively stable in appearance from the prior exam. A right-sided PICC line is noted in the mid superior vena cava. A right jugular central line is noted in the distal superior vena cava. A nasogastric catheter is noted extending towards the stomach. The proximal side port of the nasogastric catheter lies within the distal esophagus. The cardiac shadow is stable. Diffuse vascular congestion with interstitial edema is noted consistent with increasing CHF. No bony abnormality is noted.  IMPRESSION: Tubes and lines as described above.  Increasing vascular congestion and edema consistent with CHF.   Electronically Signed   By: Inez Catalina M.D.   On: 08/02/2013 17:13   Portable Chest Xray In Am  08/02/2013   CLINICAL DATA:  Shortness of breath. Respiratory failure. Ventilator.  EXAM: PORTABLE CHEST - 1 VIEW  COMPARISON:  08/01/2013  FINDINGS: Endotracheal tube is in good position the top of the aortic arch. NG tube tip is  in the body of the stomach with side hole at the gastroesophageal junction. PICC tip is in the superior vena cava of of the cavoatrial junction.  There has been almost complete clearing of the infiltrate in the right upper lobe. There is consolidation and a small effusion at the left base, unchanged. Slight haziness persists at the right base.  Heart size is normal. Pulmonary vascularity is within normal limits.  IMPRESSION: 1. Almost complete clearing of the right upper lobe infiltrate. 2. Persistent consolidation small effusion at the left base.   Electronically Signed   By: Rozetta Nunnery M.D.   On: 08/02/2013 07:42   Portable Chest Xray  08/01/2013   CLINICAL DATA:  Endotracheal tube placement.  EXAM: PORTABLE CHEST - 1 VIEW  COMPARISON:  July 29, 2013.  FINDINGS: Stable cardiomegaly. Endotracheal tube is seen projected over tracheal air shadow with distal tip 5 cm above the carina. Right-sided PICC line is noted with distal tip in expected position of the SVC. No pneumothorax is noted. Right upper lobe opacity is noted concerning for pneumonia or edema. Mild left basilar opacity is noted concerning for pneumonia or atelectasis. Nasogastric tube is seen passing through the esophagus into the stomach.  IMPRESSION: Endotracheal tube in grossly good position. Increased right upper lobe opacity concerning for pneumonia or edema. Left basilar opacity is noted concerning for pneumonia or subsegmental atelectasis.   Electronically Signed   By: Sabino Dick M.D.   On: 08/01/2013 19:15   Dg Abd Portable 1v  08/02/2013   CLINICAL DATA:  Abdominal distention  EXAM: PORTABLE ABDOMEN - 1 VIEW  COMPARISON:  07/31/2013  FINDINGS: Nonspecific but nonobstructive bowel gas pattern.  Left lower quadrant ostomy.  Enteric tube terminates in the gastric body.  Cholecystectomy clips in the right upper abdomen.  IMPRESSION: Nonobstructive bowel gas pattern.  Left lower quadrant ostomy.   Electronically Signed   By: Julian Hy M.D.   On: 08/02/2013 17:05    Cardiac Studies:  ECG:  ST no acute ST elevation    Telemetry:  ST  111  Echo:   Medications:   . antiseptic oral rinse  15 mL Mouth Rinse QID  . chlorhexidine  15 mL Mouth Rinse BID  . fluconazole (DIFLUCAN) IV  200 mg Intravenous Q24H  . hydrocortisone sod succinate (SOLU-CORTEF) inj  50 mg Intravenous Q6H  . insulin aspart  0-20 Units Subcutaneous 6 times per day  . lip balm  1 application  Topical BID  . pantoprazole (PROTONIX) IV  40 mg Intravenous Q24H  . piperacillin-tazobactam (ZOSYN)  IV  3.375 g Intravenous 3 times per day  . potassium chloride  10 mEq Intravenous Q1 Hr x 4  . vancomycin  750 mg Intravenous Q12H     . sodium chloride 10 mL/hr at 08/03/13 0900  . Marland KitchenTPN (CLINIMIX-E) Adult 40 mL/hr at 08/02/13 1721   And  . fat emulsion 10 kcal (08/02/13 2000)  . Marland KitchenTPN (CLINIMIX-E) Adult     And  . fat emulsion    . fentaNYL infusion INTRAVENOUS 150 mcg/hr (08/03/13 0830)  . phenylephrine (NEO-SYNEPHRINE) Adult infusion 200 mcg/min (08/03/13 1040)  . vasopressin (PITRESSIN) infusion - *FOR SHOCK* 0.03 Units/min (08/03/13 0835)    Assessment/Plan:  CAD:  12/14 DES to mid circumflex and OM  Unable to give antiplatlets due to anemia and bowel surgery.  Unable to Rx tachycardia Due to hypotension and need for pressors.  CVP 5 CXR consistant with CHF/ARDS and BNP elevated.  Agree with lasix if unable to  Oxygenate and MAP over 60.  Continue broad spectum antibiotics and routine post surgical care.  If volume needs to be replaced use blood Echo pending for EF and ? MR.  Enzymes negative and no acute ECG changes  ECG in am  Jenkins Rouge 08/03/2013, 1:57 PM

## 2013-08-04 ENCOUNTER — Inpatient Hospital Stay (HOSPITAL_COMMUNITY): Payer: Medicare HMO

## 2013-08-04 LAB — COMPREHENSIVE METABOLIC PANEL
ALT: 12 U/L (ref 0–35)
AST: 33 U/L (ref 0–37)
Albumin: 1.5 g/dL — ABNORMAL LOW (ref 3.5–5.2)
Alkaline Phosphatase: 55 U/L (ref 39–117)
Anion gap: 14 (ref 5–15)
BUN: 27 mg/dL — ABNORMAL HIGH (ref 6–23)
CALCIUM: 7.9 mg/dL — AB (ref 8.4–10.5)
CO2: 18 meq/L — AB (ref 19–32)
CREATININE: 0.91 mg/dL (ref 0.50–1.10)
Chloride: 100 mEq/L (ref 96–112)
GFR calc Af Amer: 70 mL/min — ABNORMAL LOW (ref >90)
GFR, EST NON AFRICAN AMERICAN: 61 mL/min — AB (ref >90)
GLUCOSE: 220 mg/dL — AB (ref 70–99)
Potassium: 4.3 mEq/L (ref 3.7–5.3)
SODIUM: 132 meq/L — AB (ref 137–147)
TOTAL PROTEIN: 4.3 g/dL — AB (ref 6.0–8.3)
Total Bilirubin: 0.3 mg/dL (ref 0.3–1.2)

## 2013-08-04 LAB — POCT I-STAT 3, ART BLOOD GAS (G3+)
ACID-BASE DEFICIT: 8 mmol/L — AB (ref 0.0–2.0)
Acid-base deficit: 6 mmol/L — ABNORMAL HIGH (ref 0.0–2.0)
Bicarbonate: 19.7 mEq/L — ABNORMAL LOW (ref 20.0–24.0)
Bicarbonate: 20.6 mEq/L (ref 20.0–24.0)
O2 SAT: 78 %
O2 SAT: 98 %
PCO2 ART: 49.8 mmHg — AB (ref 35.0–45.0)
PCO2 ART: 49.2 mmHg — AB (ref 35.0–45.0)
PO2 ART: 52 mmHg — AB (ref 80.0–100.0)
Patient temperature: 99
TCO2: 21 mmol/L (ref 0–100)
TCO2: 22 mmol/L (ref 0–100)
pH, Arterial: 7.206 — ABNORMAL LOW (ref 7.350–7.450)
pH, Arterial: 7.235 — ABNORMAL LOW (ref 7.350–7.450)
pO2, Arterial: 130 mmHg — ABNORMAL HIGH (ref 80.0–100.0)

## 2013-08-04 LAB — POCT I-STAT 4, (NA,K, GLUC, HGB,HCT)
GLUCOSE: 118 mg/dL — AB (ref 70–99)
HCT: 27 % — ABNORMAL LOW (ref 36.0–46.0)
Hemoglobin: 9.2 g/dL — ABNORMAL LOW (ref 12.0–15.0)
POTASSIUM: 3.9 meq/L (ref 3.7–5.3)
Sodium: 139 mEq/L (ref 137–147)

## 2013-08-04 LAB — GLUCOSE, CAPILLARY
GLUCOSE-CAPILLARY: 200 mg/dL — AB (ref 70–99)
GLUCOSE-CAPILLARY: 169 mg/dL — AB (ref 70–99)
GLUCOSE-CAPILLARY: 131 mg/dL — AB (ref 70–99)
Glucose-Capillary: 220 mg/dL — ABNORMAL HIGH (ref 70–99)
Glucose-Capillary: 211 mg/dL — ABNORMAL HIGH (ref 70–99)

## 2013-08-04 LAB — PROCALCITONIN: PROCALCITONIN: 1.97 ng/mL

## 2013-08-04 LAB — PHOSPHORUS: PHOSPHORUS: 3.5 mg/dL (ref 2.3–4.6)

## 2013-08-04 LAB — MAGNESIUM: Magnesium: 2 mg/dL (ref 1.5–2.5)

## 2013-08-04 MED ORDER — FAT EMULSION 20 % IV EMUL
250.0000 mL | INTRAVENOUS | Status: AC
  Administered 2013-08-04: 250 mL via INTRAVENOUS
  Filled 2013-08-04: qty 250

## 2013-08-04 MED ORDER — FUROSEMIDE 10 MG/ML IJ SOLN
40.0000 mg | Freq: Once | INTRAMUSCULAR | Status: AC
  Administered 2013-08-04: 40 mg via INTRAVENOUS
  Filled 2013-08-04: qty 4

## 2013-08-04 MED ORDER — TRACE MINERALS CR-CU-F-FE-I-MN-MO-SE-ZN IV SOLN
INTRAVENOUS | Status: AC
  Administered 2013-08-04: 17:00:00 via INTRAVENOUS
  Filled 2013-08-04: qty 2000

## 2013-08-04 NOTE — Progress Notes (Signed)
PARENTERAL NUTRITION CONSULT NOTE - Follow-up  Pharmacy Consult for TPN Indication: Bowel Obstruction  Allergies  Allergen Reactions  . Pregabalin Swelling    Tongue swelling  . Sulfonamide Derivatives Swelling    Childhood reaction   Patient Measurements: Height: 5' (152.4 cm) Weight: 171 lb 4.8 oz (77.7 kg) IBW/kg (Calculated) : 45.5  Vital Signs: Temp: 98.9 F (37.2 C) (07/16 0000) Temp src: Oral (07/16 0000) BP: 136/57 mmHg (07/16 0630) Pulse Rate: 101 (07/16 0700) Intake/Output from previous day: 07/15 0701 - 07/16 0700 In: 4598.9 [I.V.:2516.2; NG/GT:100; IV Piggyback:600; TPN:1382.7] Out: 400 [Urine:235; Stool:165] Intake/Output from this shift:  Labs:  Recent Labs  08/01/13 1930  08/02/13 1924 08/03/13 0647 08/03/13 1930  WBC 5.0  < > 11.9* 14.2* 16.7*  HGB 10.8*  < > 8.9* 8.2* 8.2*  HCT 32.7*  < > 26.9* 25.8* 25.5*  PLT 224  < > 240 264 279  INR 1.25  --   --   --   --   < > = values in this interval not displayed.  Recent Labs  08/02/13 0118 08/03/13 0400 08/04/13 0341  NA 139 137 132*  K 3.6* 3.6* 4.3  CL 105 103 100  CO2 22 20 18*  GLUCOSE 208* 121* 220*  BUN 9 19 27*  CREATININE 0.68 0.91 0.91  CALCIUM 7.7* 8.0* 7.9*  MG  --  1.5 2.0  PHOS  --  2.5 3.5  PROT  --   --  4.3*  ALBUMIN  --   --  1.5*  AST  --   --  33  ALT  --   --  12  ALKPHOS  --   --  55  BILITOT  --   --  0.3   Estimated Creatinine Clearance: 50 ml/min (by C-G formula based on Cr of 0.91).   CBG (last 3)   Recent Labs  08/03/13 1934 08/03/13 2341 08/04/13 0343  GLUCAP 227* 222* 220*    Insulin Requirements in the past 24 hours:  39 units Novolog SSI; 40 units regular insulin in TPN (pt receives about 29 units)  Current Nutrition:  Clinimix E 5/15 at 60 ml/hr and 20% lipids at 10 ml/hr provides 72 gm protein and 1502 kcal.  Nutritional Goals:  1750-1900 kcal, 80-95 gm protein (per RD note 7/13)  Assessment: Admit: 73 yo female admitted with c/o  abdominal pain associated with N&V.  She has been admitted to [facility] multiple times over the past few months with a similar complaint.  Of note, past surgical history reveals an appendectomy, cholecystectomy, colon resection and an abdominal hysterectomy.  GI: Pt with prolonged obstruction and expect prolonged post-op ileus. S/p exp lap/LOA/partial colectomy/end-colostomy, small bowel resection, repair of multiple enterotomies 7/13. NPO with TPN. Baseline prealbumin 9 (low).  Endo: No h/o DM. CBGs >200 past 24 hrs. Pt continues on resistant SSI with insulin in TPN. Steroids added 7/15 so probably exacerbating hyperglycemia.  Lytes: Na low, bicarb low, Corrected Ca 9.9 (low alb 1.5), other lytes wnl.   Renal: SCr stable but UOP down to ~0.1 ml/kg/hr. CVP 12.  Pulm: Remains intubated. Fio2 80%.  Cards: CAD, HTN. Requiring phenylephrine at 180 mcg/min and vasopressin 0.03 units/min. HR 86-110.  Hepatobil: LFTs wnl. TG 96.  Neuro: Sedated with fent gtt. GCS 10. h/o depression. Sertraline PTA  ID: Abx broadened to Vanc/Zosyn for sepsis. Also on diflucan for candidal UTI. Wbc up to 16.7. PCT 1.97.  Best Practices: Lovenox, PPI IV (home med)  Home meds: Will need resumed once pt able to take po  TPN Access: Double lumen PICC placed 7/12  TPN day#: 4  Plan:  1.  Continue Clinimix E 5/15 at 56ml/hr and continue 20% lipids at 10 ml/hr. Will not advance to goal with hyperglycemia. 2.  Continue resistant SSI coverage and increase insulin in regular TPN bag to 100 units in 2L bag (pt will receive 72 units) 3.  F/u TPN labs  Sherlon Handing, PharmD, BCPS Clinical pharmacist, pager [phone] 08/04/2013,8:01 AM

## 2013-08-04 NOTE — Progress Notes (Signed)
3 Days Post-Op  Subjective: Remains intubated and sedated on pressors  Objective: Vital signs in last 24 hours: Temp:  [98.4 F (36.9 C)-99.8 F (37.7 C)] 98.9 F (37.2 C) (07/16 0000) Pulse Rate:  [86-110] 101 (07/16 0700) Resp:  [12-25] 13 (07/16 0700) BP: (73-146)/(22-102) 136/57 mmHg (07/16 0630) SpO2:  [86 %-100 %] 98 % (07/16 0700) Arterial Line BP: (79-158)/(24-91) 156/61 mmHg (07/16 0700) FiO2 (%):  [70 %-100 %] 80 % (07/16 0420) Weight:  [171 lb 4.8 oz (77.7 kg)] 171 lb 4.8 oz (77.7 kg) (07/16 0400) Last BM Date: 08/04/13  Intake/Output from previous day: 07/15 0701 - 07/16 0700 In: 4598.9 [I.V.:2516.2; NG/GT:100; IV Piggyback:600; TPN:1382.7] Out: 400 [Urine:235; Stool:165] Intake/Output this shift:    Abdomen soft, incision clean, ostomy viable  Lab Results:   Recent Labs  08/03/13 0647 08/03/13 1930  WBC 14.2* 16.7*  HGB 8.2* 8.2*  HCT 25.8* 25.5*  PLT 264 279   BMET  Recent Labs  08/03/13 0400 08/04/13 0341  NA 137 132*  K 3.6* 4.3  CL 103 100  CO2 20 18*  GLUCOSE 121* 220*  BUN 19 27*  CREATININE 0.91 0.91  CALCIUM 8.0* 7.9*   PT/INR  Recent Labs  08/01/13 1930  LABPROT 15.7*  INR 1.25   ABG  Recent Labs  08/03/13 0422 08/04/13 0413  PHART 7.303* 7.206*  HCO3 19.9* 19.7*    Studies/Results: Dg Chest Port 1 View  08/04/2013   CLINICAL DATA:  Re- intubated.  Monitor pulmonary edema  EXAM: PORTABLE CHEST - 1 VIEW  COMPARISON:  Chest x-ray from yesterday  FINDINGS: There is an endotracheal tube which ends in the lower thoracic trachea, 3.5 cm above the carina. [name] right upper extremity PICC and right IJ central line are in stable position. An orogastric tube reaches the stomach.  Unchanged cardiomegaly. Stable mediastinal contours when accounting for left rotation. Diffuse interstitial and airspace opacity is unchanged. There is no evidence of increasing pleural fluid or pneumothorax.  IMPRESSION: 1. Unchanged and unremarkable  positioning of tubes and lines. 2. Unchanged pulmonary edema or diffuse pneumonia.   Electronically Signed   By: Jorje Guild M.D.   On: 08/04/2013 05:27   Dg Chest Port 1 View  08/03/2013   CLINICAL DATA:  Respiratory failure.  EXAM: PORTABLE CHEST - 1 VIEW  COMPARISON:  08/02/2013  FINDINGS: Endotracheal tube remains with the tip approximately 3 cm above the carina. Nasogastric tube extends into the proximal stomach. Central line and additional PICC line positioning are stable. Lungs show slight interval worsening of bilateral pulmonary airspace disease and edema. No significant pleural effusions are identified. No pneumothorax is seen. The heart size is stable.  IMPRESSION: Worsening of bilateral pulmonary airspace disease and edema.   Electronically Signed   By: Aletta Edouard M.D.   On: 08/03/2013 07:49   Dg Chest Port 1 View  08/02/2013   CLINICAL DATA:  Check endotracheal tube position  EXAM: PORTABLE CHEST - 1 VIEW  COMPARISON:  08/02/2013  FINDINGS: An endotracheal tube is again identified just above the aorta at approximately 5 cm above the carina. Is relatively stable in appearance from the prior exam. [name] right-sided PICC line is noted in the mid superior vena cava. [name] right jugular central line is noted in the distal superior vena cava. [name] nasogastric catheter is noted extending towards the stomach. The proximal side port of the nasogastric catheter lies within the distal esophagus. The cardiac shadow is stable. Diffuse vascular congestion with interstitial  edema is noted consistent with increasing CHF. No bony abnormality is noted.  IMPRESSION: Tubes and lines as described above.  Increasing vascular congestion and edema consistent with CHF.   Electronically Signed   By: Inez Catalina M.D.   On: 08/02/2013 17:13   Dg Abd Portable 1v  08/02/2013   CLINICAL DATA:  Abdominal distention  EXAM: PORTABLE ABDOMEN - 1 VIEW  COMPARISON:  07/31/2013  FINDINGS: Nonspecific but nonobstructive bowel gas  pattern.  Left lower quadrant ostomy.  Enteric tube terminates in the gastric body.  Cholecystectomy clips in the right upper abdomen.  IMPRESSION: Nonobstructive bowel gas pattern.  Left lower quadrant ostomy.   Electronically Signed   By: Julian Hy M.D.   On: 08/02/2013 17:05    Anti-infectives: Anti-infectives   Start     Dose/Rate Route Frequency Ordered Stop   08/03/13 1800  vancomycin (VANCOCIN) IVPB 750 mg/150 ml premix     750 mg 150 mL/hr over 60 Minutes Intravenous Every 12 hours 08/03/13 0440     08/03/13 0500  vancomycin (VANCOCIN) 1,250 mg in sodium chloride 0.9 % 250 mL IVPB     1,250 mg 166.7 mL/hr over 90 Minutes Intravenous  Once 08/03/13 0440 08/03/13 0646   08/03/13 0500  piperacillin-tazobactam (ZOSYN) IVPB 3.375 g     3.375 g 12.5 mL/hr over 240 Minutes Intravenous 3 times per day 08/03/13 0440     08/01/13 1245  metroNIDAZOLE (FLAGYL) IVPB 500 mg     500 mg 100 mL/hr over 60 Minutes Intravenous To Surgery 08/01/13 1238 08/01/13 1300   07/31/13 0800  cefoTEtan (CEFOTAN) 2 g in dextrose 5 % 50 mL IVPB     2 g 100 mL/hr over 30 Minutes Intravenous On call to O.R. 07/31/13 0741 08/01/13 0559   07/31/13 0800  cefTRIAXone (ROCEPHIN) 2 g in dextrose 5 % 50 mL IVPB  Status:  Discontinued    Comments:  Pharmacy may adjust dosing strength / duration / interval for maximal efficacy   2 g 100 mL/hr over 30 Minutes Intravenous Every 24 hours 07/31/13 0744 08/03/13 0405   07/29/13 2100  ciprofloxacin (CIPRO) IVPB 400 mg  Status:  Discontinued     400 mg 200 mL/hr over 60 Minutes Intravenous Every 12 hours 07/29/13 2058 07/31/13 0744   07/29/13 2100  metroNIDAZOLE (FLAGYL) IVPB 500 mg  Status:  Discontinued     500 mg 100 mL/hr over 60 Minutes Intravenous Every 8 hours 07/29/13 2058 08/03/13 0405   07/29/13 2100  fluconazole (DIFLUCAN) IVPB 200 mg     200 mg 100 mL/hr over 60 Minutes Intravenous Every 24 hours 07/29/13 2058     07/29/13 1930  ciprofloxacin (CIPRO)  IVPB 400 mg     400 mg 200 mL/hr over 60 Minutes Intravenous  Once 07/29/13 1915 07/29/13 2031   07/29/13 1930  metroNIDAZOLE (FLAGYL) IVPB 500 mg  Status:  Discontinued     500 mg 100 mL/hr over 60 Minutes Intravenous  Once 07/29/13 1915 07/29/13 2107      Assessment/Plan: s/p Procedure(s): EXPLORATORY LAPAROTOMY (N/[name]) PARTIAL COLECTOMY (N/[name]) COLOSTOMY (N/[name]) SMALL BOWEL RESECTION (N/[name])  Continue TNA, NG Critical care per CCM Dressing changes  LOS: 6 days    [name] 08/04/2013

## 2013-08-04 NOTE — Progress Notes (Signed)
PULMONARY / CRITICAL CARE MEDICINE   Name: [name] MRN: [id number] DOB: 10-31-1940    ADMISSION DATE:  07/29/2013 CONSULTATION DATE:  08/01/2013  REFERRING MD :  Surgery PRIMARY SERVICE: PCCM  CHIEF COMPLAINT:  Bowel obstruction  BRIEF PATIENT DESCRIPTION: 73 y/o female with PMH CAD, HTN, Barrett's esophagus, presented to [facility] ED 7/10 with LLQ pain. Found to have bowel obstruction. To OR for repair 7/13. Remains of vent post-op. Consulted PCCM.  SIGNIFICANT EVENTS / STUDIES: 7/10 Admit, CT Abd - dilated prox colon w/ abrupt transition c/w with obstruction, diverticulitis vs colitis, Surgery c/s 7/12 Empiric Cipro/Flagyl, Cardiology consulted for pre-op evaluation, TPN per pharm 7/13 - Laparotomy, partial colectomy, colostomy, SB resection, repair of enterotomy, lysis of adhesions         - Hgb down to 6.8 >> imp to 10.8 s/p 2u PRBC 7/14 - Extubation, CXR stable, Hgb 9.5, o/n tachycardic >> Cards start Esmolol, IVF         - o/n required re-intubation due to desat, decompsenate, hypoxemic resp failure, hypotensive - concern aspiration         - DC Esmolol, start Vaso, switch to Vanc / Zosyn 7/15 - CXR (worsening bilateral airspace disease, suspect edema), concern for volume overload despite CVP 5-6         - Add Vaso, Trop (neg), BNP 3782, PCT 1.83, ECHO (normal LVEF) 7/16 - Considered ARDS (unclear etiology, ?aspiration), CXR (persistent b/l edema), cont pressors, stable low PCT 1.9  LINES / TUBES: #1 OETT 7/13 >>> 7/14 #2 OETT 7/14 >>>  Colostomy 7/13 >>> PICC 7/12 >>> NGT 7/10 >>>  CULTURES: Blood x 2, 7/15 >>> Respiratory 7/15 >> negative (candida)  ANTIBIOTICS: Ceftriaxone 7/10 >>> 7/14 Flagyl 7/10 >>> 7/14 Vancomycin 7/14 >>> 7/16 Fluconazole 7/10 >>> 7/16 Zosyn 7/14 >>>  SUBJECTIVE:  Overnight with poor UOP and positive fluid balance, remains on pressors, sedated on vent. Minimal awake up on assessment, unable to follow commands. Functioning ostomy with  stool. Family at bedside.  VITAL SIGNS: Temp:  [98.4 F (36.9 C)-100.2 F (37.9 C)] 100.2 F (37.9 C) (07/16 0831) Pulse Rate:  [86-108] 99 (07/16 1000) Resp:  [12-31] 15 (07/16 1000) BP: (73-146)/(22-102) 129/62 mmHg (07/16 1000) SpO2:  [86 %-100 %] 100 % (07/16 1000) Arterial Line BP: (79-158)/(24-91) 143/61 mmHg (07/16 1000) FiO2 (%):  [70 %-100 %] 80 % (07/16 0925) Weight:  [171 lb 4.8 oz (77.7 kg)] 171 lb 4.8 oz (77.7 kg) (07/16 0400) HEMODYNAMICS: CVP:  [6 mmHg-12 mmHg] 12 mmHg VENTILATOR SETTINGS: Vent Mode:  [-] PRVC FiO2 (%):  [70 %-100 %] 80 % Set Rate:  [16 bmp-25 bmp] 25 bmp Vt Set:  [480 mL] 480 mL PEEP:  [12 cmH20] 12 cmH20 Pressure Support:  [5 cmH20] 5 cmH20 Plateau Pressure:  [20 cmH20-29 cmH20] 29 cmH20 INTAKE / OUTPUT: Intake/Output     07/15 0701 - 07/16 0700 07/16 0701 - 07/17 0700   I.V. (mL/kg) 2615.2 (33.7) 279.1 (3.6)   NG/GT 100    IV Piggyback 600    TPN 1452.7 210   Total Intake(mL/kg) 4767.9 (61.4) 489.1 (6.3)   Urine (mL/kg/hr) 280 (0.2)    Emesis/NG output     Stool 165 (0.1)    Total Output 445     Net +4322.9 +489.1          PHYSICAL EXAMINATION: General:  On vent Neuro:  Sedated RASS -3, not following commands Cardiovascular:  Tachycardic, regular rhythm, no murmurs Lungs:  Persistent coarse bilateral  breath sounds Abdomen:  Soft, abd dressing in place. LLQ colostomy appears healthy with viable tissue, diminished BS Ext:  Bilateral lower ext cool, trace/limited pulse in RLE, LLE intact pulse, +2 pitting edema b/l ext Skin:  Improved urticaria, now located bilateral shins (under SCDs)  LABS:  CBC  Recent Labs Lab 08/02/13 1924 08/03/13 0647 08/03/13 1930  WBC 11.9* 14.2* 16.7*  HGB 8.9* 8.2* 8.2*  HCT 26.9* 25.8* 25.5*  PLT 240 264 279   Coag's  Recent Labs Lab 08/01/13 1930  INR 1.25   BMET  Recent Labs Lab 08/02/13 0118 08/03/13 0400 08/04/13 0341  NA 139 137 132*  K 3.6* 3.6* 4.3  CL 105 103 100  CO2  22 20 18*  BUN 9 19 27*  CREATININE 0.68 0.91 0.91  GLUCOSE 208* 121* 220*   Electrolytes  Recent Labs Lab 08/01/13 0400  08/02/13 0118 08/03/13 0400 08/04/13 0341  CALCIUM 8.1*  < > 7.7* 8.0* 7.9*  MG 2.0  --   --  1.5 2.0  PHOS 2.3  --   --  2.5 3.5  < > = values in this interval not displayed. Sepsis Markers  Recent Labs Lab 07/29/13 1624 08/03/13 0930 08/04/13 0341  LATICACIDVEN 0.86  --   --   PROCALCITON  --  1.83 1.97   ABG  Recent Labs Lab 08/02/13 1736 08/03/13 0422 08/04/13 0413  PHART 7.196* 7.303* 7.206*  PCO2ART 57.5* 42.3 49.8*  PO2ART 155.0* 62.9* 52.0*   Liver Enzymes  Recent Labs Lab 07/29/13 1605 08/01/13 0400 08/04/13 0341  AST 14 12 33  ALT 13 10 12   ALKPHOS 82 72 55  BILITOT 0.4 0.4 0.3  ALBUMIN 2.9* 2.3* 1.5*   Cardiac Enzymes  Recent Labs Lab 07/29/13 1605 08/03/13 0930  TROPONINI <0.30 <0.30  PROBNP  --  3782.0*   Glucose  Recent Labs Lab 08/03/13 1120 08/03/13 1639 08/03/13 1934 08/03/13 2341 08/04/13 0343 08/04/13 0806  GLUCAP 158* 209* 227* 222* 220* 211*    Imaging - noted   ASSESSMENT / PLAN:  PULMONARY A: Acute respiratory failure (post-op), req re-intubation 7/14 following extubation with hypoxemic failure - On vent ARDS, unclear etiology, question aspiration - persistent b/l edema on CXR 7/16 Ruled out Cardiogenic Etiology of Pulm Edema - 7/15 ECHO (nml LVEF) CAP, presumed P:   Full mech vent support, ARDS protocol for PEEP/FIO2 titration Today resume PRVC, noted asynchronous breathing, monitor as tolerated, repeat ABG on setting change BDs PRN Pulmonary hygiene Repeat CXR in AM Hold Lasix in setting of ARDS at this time while remains on pressors  CARDIOVASCULAR A:  Shock, consistent with Septic etiology, presumed CAP source - On pressors, improved CVP Hemorrhagic Shock - Resolved, no evidence of persistent bleeding Tachycardia, likely secondary to hypovolemia, post-op H/o CAD,  HTN Elevated BNP, but Preserved LVEF - No evidence cardiogenic shock - 7/15 ECHO (nml EF) P:  Pressor support with goal MAP >65 Continue Neo, Vaso KVO IVF Hold plavix, lipitor, ASA Cardiology following  RENAL A:   Non-gap Metabolic Acidosis Hyponatremia Hypokalemia - Resolved P:   BMET in AM Replace electrolytes as needed  GASTROINTESTINAL A:   Suspected colitis with colonic obstruction/SBO s/p partial colectomy with new colostomy 7/13 Ileus, post-op Hx of Barrett's esophagus Nutrition P:   Post op care, nutrition per surgery, remains on TPN Wound/ostomy care consult Continue PPI  HEMATOLOGIC A:   Acute blood loss anemia, post-op - Stable, Hgb 8.2 VTE ppx P:  SCD's CBC daily Hold enoxaparin,  asa, plavix  Transfuse for Hb < 7 or bleeding  INFECTIOUS A:   Colitis, s/p partial colectomy Presumed CAP, question aspiration PNA - Stable low PCT, elevated WBC 16.7 P:   Discontinued Vanc Antibiotics and micro as above F/u fever curve Trend PCT  ENDOCRINE A: Septic Shock, requiring stress dose steroids   Hyperglycemia No hx DM P:   Solucortef 50mg  IV q 6 hr SSI Adjust insulin per pharm with TPN  NEUROLOGIC A:   Acute encephalopathy in post operative setting P:   RASS goal: -1, may need deeper sedation with IV versed for vent synchrony PRN fentanyl for sedation Restraints  DERMATOLOGY A: Urticaria >> suspect may be related to opiates - Improved on steroids P: Monitor Steroids may improve PRN benadryl  GLOBAL: Overall guarded prognosis in setting of presumed Septic Shock, considered ARDS of unexplained etiology, ?aspiration, (adjsuted vent for low Tv), remains on pressors, antibiotics (DC'd Vanc). Surgery following, remains on TPN, post-op ileus otherwise well functioning ostomy.  Husband updated in detail at bedside.  Nobie Putnam, [place], PGY-2  Care during the described time interval was provided by me and/or  other providers on the critical care team.  I have reviewed this patient's available data, including medical history, events of note, physical examination and test results as part of my evaluation  CC time x 67m  Kurk Corniel V. MD  08/04/2013, 11:19 AM

## 2013-08-05 ENCOUNTER — Encounter (HOSPITAL_COMMUNITY): Payer: Self-pay

## 2013-08-05 ENCOUNTER — Inpatient Hospital Stay (HOSPITAL_COMMUNITY): Payer: Medicare HMO

## 2013-08-05 DIAGNOSIS — R609 Edema, unspecified: Secondary | ICD-10-CM

## 2013-08-05 LAB — PREPARE RBC (CROSSMATCH)

## 2013-08-05 LAB — BASIC METABOLIC PANEL
ANION GAP: 11 (ref 5–15)
BUN: 35 mg/dL — ABNORMAL HIGH (ref 6–23)
CHLORIDE: 99 meq/L (ref 96–112)
CO2: 22 meq/L (ref 19–32)
Calcium: 8.6 mg/dL (ref 8.4–10.5)
Creatinine, Ser: 0.67 mg/dL (ref 0.50–1.10)
GFR calc Af Amer: >90 mL/min (ref >90)
GFR calc non Af Amer: 84 mL/min — ABNORMAL LOW (ref >90)
Glucose, Bld: 128 mg/dL — ABNORMAL HIGH (ref 70–99)
Potassium: 4 mEq/L (ref 3.7–5.3)
SODIUM: 132 meq/L — AB (ref 137–147)

## 2013-08-05 LAB — CULTURE, RESPIRATORY W GRAM STAIN

## 2013-08-05 LAB — CBC
HCT: 21.9 % — ABNORMAL LOW (ref 36.0–46.0)
Hemoglobin: 6.9 g/dL — CL (ref 12.0–15.0)
MCH: 28.2 pg (ref 26.0–34.0)
MCHC: 31.5 g/dL (ref 30.0–36.0)
MCV: 89.4 fL (ref 78.0–100.0)
PLATELETS: 206 10*3/uL (ref 150–400)
RBC: 2.45 MIL/uL — AB (ref 3.87–5.11)
RDW: 16 % — ABNORMAL HIGH (ref 11.5–15.5)
WBC: 16.1 10*3/uL — ABNORMAL HIGH (ref 4.0–10.5)

## 2013-08-05 LAB — PROCALCITONIN: Procalcitonin: 1.02 ng/mL

## 2013-08-05 LAB — GLUCOSE, CAPILLARY
GLUCOSE-CAPILLARY: 94 mg/dL (ref 70–99)
Glucose-Capillary: 103 mg/dL — ABNORMAL HIGH (ref 70–99)
Glucose-Capillary: 108 mg/dL — ABNORMAL HIGH (ref 70–99)
Glucose-Capillary: 134 mg/dL — ABNORMAL HIGH (ref 70–99)

## 2013-08-05 LAB — CULTURE, RESPIRATORY: GRAM STAIN: NONE SEEN

## 2013-08-05 MED ORDER — FAT EMULSION 20 % IV EMUL
250.0000 mL | INTRAVENOUS | Status: AC
  Administered 2013-08-05: 250 mL via INTRAVENOUS
  Filled 2013-08-05: qty 250

## 2013-08-05 MED ORDER — IOHEXOL 300 MG/ML  SOLN
80.0000 mL | Freq: Once | INTRAMUSCULAR | Status: AC | PRN
  Administered 2013-08-05: 80 mL via INTRAVENOUS

## 2013-08-05 MED ORDER — TRACE MINERALS CR-CU-F-FE-I-MN-MO-SE-ZN IV SOLN
INTRAVENOUS | Status: AC
  Administered 2013-08-05: 18:00:00 via INTRAVENOUS
  Filled 2013-08-05: qty 2000

## 2013-08-05 MED ORDER — IOHEXOL 300 MG/ML  SOLN
25.0000 mL | INTRAMUSCULAR | Status: AC
  Administered 2013-08-05 (×2): 25 mL via ORAL

## 2013-08-05 NOTE — Progress Notes (Signed)
[place] Progress Note Patient Name: [name] DOB: October 06, 1940 MRN: [id number]  Date of Service  08/05/2013   HPI/Events of Note  Hgb 6.9   eICU Interventions  One unit PRBCs ordered   Intervention Category Evaluation Type: Other  Merton Border 08/05/2013, 5:16 AM

## 2013-08-05 NOTE — Progress Notes (Signed)
Right upper extremity venous duplex:  DVT noted in the right axillary vein.

## 2013-08-05 NOTE — Progress Notes (Signed)
Patient ID: [name], female   DOB: 02/25/1940, 73 y.o.   MRN: [id number] 4 Days Post-Op  Subjective: Patient intubated on ventilator. Open eyes to her name  Objective: Vital signs in last 24 hours: Temp:  [98.4 F (36.9 C)-99.9 F (37.7 C)] 98.5 F (36.9 C) (07/17 0645) Pulse Rate:  [72-100] 84 (07/17 0715) Resp:  [15-31] 28 (07/17 0715) BP: (91-134)/(35-70) 123/62 mmHg (07/17 0715) SpO2:  [96 %-100 %] 100 % (07/17 0715) Arterial Line BP: (96-143)/(42-72) 133/67 mmHg (07/17 0600) FiO2 (%):  [60 %-80 %] 60 % (07/17 0715) Last BM Date: 08/04/13  Intake/Output from previous day: 07/16 0701 - 07/17 0700 In: 3256.8 [I.V.:1364.3; Blood:12.5; IV Piggyback:200; TPN:1680] Out: 2100 [Urine:1900; Stool:200] Intake/Output this shift:    PE: Abd: Soft, hypoactive bowel sounds, nondistended, midline wound is clean and packed. Ostomy in place with some liquid stool output. Minimal NG tube output  Lab Results:   Recent Labs  08/03/13 1930 08/05/13 0452  WBC 16.7* 16.1*  HGB 8.2* 6.9*  HCT 25.5* 21.9*  PLT 279 206   BMET  Recent Labs  08/04/13 0341 08/05/13 0452  NA 132* 132*  K 4.3 4.0  CL 100 99  CO2 18* 22  GLUCOSE 220* 128*  BUN 27* 35*  CREATININE 0.91 0.67  CALCIUM 7.9* 8.6   PT/INR No results found for this basename: LABPROT, INR,  in the last 72 hours CMP     Component Value Date/Time   NA 132* 08/05/2013 0452   K 4.0 08/05/2013 0452   CL 99 08/05/2013 0452   CO2 22 08/05/2013 0452   GLUCOSE 128* 08/05/2013 0452   BUN 35* 08/05/2013 0452   CREATININE 0.67 08/05/2013 0452   CALCIUM 8.6 08/05/2013 0452   PROT 4.3* 08/04/2013 0341   ALBUMIN 1.5* 08/04/2013 0341   AST 33 08/04/2013 0341   ALT 12 08/04/2013 0341   ALKPHOS 55 08/04/2013 0341   BILITOT 0.3 08/04/2013 0341   GFRNONAA 84* 08/05/2013 0452   GFRAA >90 08/05/2013 0452   Lipase     Component Value Date/Time   LIPASE 9* 07/29/2013 1605       Studies/Results: Dg Chest Port 1 View  08/05/2013    CLINICAL DATA:  ARDS.  EXAM: PORTABLE CHEST - 1 VIEW  COMPARISON:  08/04/2013  FINDINGS: Endotracheal tube ends in the mid thoracic trachea. The orogastric tube reaches the stomach. Right IJ catheter and right upper extremity PICC tip are at the level of the SVC.  Mild cardiomegaly is stable. There is diffuse interstitial coarsening which is improved from previous. Possible small left pleural effusion. No evidence of pneumothorax.  IMPRESSION: 1. Tubes and lines remain in good position. 2. Mild improvement in pulmonary edema.   Electronically Signed   By: Jorje Guild M.D.   On: 08/05/2013 06:57   Dg Chest Port 1 View  08/04/2013   CLINICAL DATA:  Re- intubated.  Monitor pulmonary edema  EXAM: PORTABLE CHEST - 1 VIEW  COMPARISON:  Chest x-ray from yesterday  FINDINGS: There is an endotracheal tube which ends in the lower thoracic trachea, 3.5 cm above the carina. A right upper extremity PICC and right IJ central line are in stable position. An orogastric tube reaches the stomach.  Unchanged cardiomegaly. Stable mediastinal contours when accounting for left rotation. Diffuse interstitial and airspace opacity is unchanged. There is no evidence of increasing pleural fluid or pneumothorax.  IMPRESSION: 1. Unchanged and unremarkable positioning of tubes and lines. 2. Unchanged pulmonary edema or  diffuse pneumonia.   Electronically Signed   By: Jorje Guild M.D.   On: 08/04/2013 05:27    Anti-infectives: Anti-infectives   Start     Dose/Rate Route Frequency Ordered Stop   08/03/13 1800  vancomycin (VANCOCIN) IVPB 750 mg/150 ml premix  Status:  Discontinued     750 mg 150 mL/hr over 60 Minutes Intravenous Every 12 hours 08/03/13 0440 08/04/13 0910   08/03/13 0500  vancomycin (VANCOCIN) 1,250 mg in sodium chloride 0.9 % 250 mL IVPB     1,250 mg 166.7 mL/hr over 90 Minutes Intravenous  Once 08/03/13 0440 08/03/13 0646   08/03/13 0500  piperacillin-tazobactam (ZOSYN) IVPB 3.375 g     3.375 g 12.5 mL/hr  over 240 Minutes Intravenous 3 times per day 08/03/13 0440     08/01/13 1245  metroNIDAZOLE (FLAGYL) IVPB 500 mg     500 mg 100 mL/hr over 60 Minutes Intravenous To Surgery 08/01/13 1238 08/01/13 1300   07/31/13 0800  cefoTEtan (CEFOTAN) 2 g in dextrose 5 % 50 mL IVPB     2 g 100 mL/hr over 30 Minutes Intravenous On call to O.R. 07/31/13 0741 08/01/13 0559   07/31/13 0800  cefTRIAXone (ROCEPHIN) 2 g in dextrose 5 % 50 mL IVPB  Status:  Discontinued    Comments:  Pharmacy may adjust dosing strength / duration / interval for maximal efficacy   2 g 100 mL/hr over 30 Minutes Intravenous Every 24 hours 07/31/13 0744 08/03/13 0405   07/29/13 2100  ciprofloxacin (CIPRO) IVPB 400 mg  Status:  Discontinued     400 mg 200 mL/hr over 60 Minutes Intravenous Every 12 hours 07/29/13 2058 07/31/13 0744   07/29/13 2100  metroNIDAZOLE (FLAGYL) IVPB 500 mg  Status:  Discontinued     500 mg 100 mL/hr over 60 Minutes Intravenous Every 8 hours 07/29/13 2058 08/03/13 0405   07/29/13 2100  fluconazole (DIFLUCAN) IVPB 200 mg  Status:  Discontinued     200 mg 100 mL/hr over 60 Minutes Intravenous Every 24 hours 07/29/13 2058 08/04/13 0911   07/29/13 1930  ciprofloxacin (CIPRO) IVPB 400 mg     400 mg 200 mL/hr over 60 Minutes Intravenous  Once 07/29/13 1915 07/29/13 2031   07/29/13 1930  metroNIDAZOLE (FLAGYL) IVPB 500 mg  Status:  Discontinued     500 mg 100 mL/hr over 60 Minutes Intravenous  Once 07/29/13 1915 07/29/13 2107       Assessment/Plan   1. POD 4, EXPLORATORY LAPAROTOMY (N/A)  PARTIAL COLECTOMY (N/A)  COLOSTOMY (N/A)  SMALL BOWEL RESECTION (N/A) 2. Postoperative ileus 3. PCM, TNA 4. Ventilator dependent respiratory failure 5. ARDS  Plan: 1. The patient seems to have some bowel function and she has minimal NG tube output and some output from her colostomy. However, she has hypoactive bowel sounds. Given the amount of resection done during her case, her NG tube should likely remain on  suction for now. If she remains intubated and her bowel function continues to improve, hopefully she can be started on some enteral tube feeds over the next several days. For now continue her TNA for nutritional support 2. Continue twice a day dressing changes to her abdominal wound 3. Continue antibiotic therapy.  LOS: 7 days    Anwar Sakata E 08/05/2013, 8:34 AM Pager: [phone]

## 2013-08-05 NOTE — Progress Notes (Signed)
ANTIBIOTIC CONSULT NOTE  Pharmacy Consult for Zosyn  Indication: rule out sepsis  Allergies  Allergen Reactions  . Pregabalin Swelling    Tongue swelling  . Sulfonamide Derivatives Swelling    Childhood reaction    Patient Measurements: Height: 5' (152.4 cm) Weight: 171 lb 4.8 oz (77.7 kg) IBW/kg (Calculated) : 45.5  Vital Signs: Temp: 97.5 F (36.4 C) (07/17 1244) Temp src: Oral (07/17 1244) BP: 91/50 mmHg (07/17 1216) Pulse Rate: 81 (07/17 1216) Intake/Output from previous day: 07/16 0701 - 07/17 0700 In: 3256.8 [I.V.:1364.3; Blood:12.5; IV Piggyback:200; TPN:1680] Out: 2100 [Urine:1900; Stool:200] Intake/Output from this shift: Total I/O In: 91.7 [I.V.:91.7] Out: 400 [Urine:400]  Labs:  Recent Labs  08/03/13 0400 08/03/13 0647 08/03/13 1930 08/04/13 0341 08/05/13 0452  WBC  --  14.2* 16.7*  --  16.1*  HGB  --  8.2* 8.2*  --  6.9*  PLT  --  264 279  --  206  CREATININE 0.91  --   --  0.91 0.67   Estimated Creatinine Clearance: 56.9 ml/min (by C-G formula based on Cr of 0.67). No results found for this basename: VANCOTROUGH, Corlis Leak, VANCORANDOM, GENTTROUGH, GENTPEAK, GENTRANDOM, TOBRATROUGH, TOBRAPEAK, TOBRARND, AMIKACINPEAK, AMIKACINTROU, AMIKACIN,  in the last 72 hours   Microbiology: Recent Results (from the past 720 hour(s))  SURGICAL PCR SCREEN     Status: None   Collection Time    07/31/13  8:55 PM      Result Value Ref Range Status   MRSA, PCR NEGATIVE  NEGATIVE Final   Staphylococcus aureus NEGATIVE  NEGATIVE Final   Comment:            The Xpert SA Assay (FDA     approved for NASAL specimens     in patients over 74 years of age),     is one component of     a comprehensive surveillance     program.  Test performance has     been validated by [employer] for patients greater     than or equal to 31 year old.     It is not intended     to diagnose infection nor to     guide or monitor treatment.  MRSA PCR SCREENING     Status: None    Collection Time    08/01/13  9:03 PM      Result Value Ref Range Status   MRSA by PCR NEGATIVE  NEGATIVE Final   Comment:            The GeneXpert MRSA Assay (FDA     approved for NASAL specimens     only), is one component of a     comprehensive MRSA colonization     surveillance program. It is not     intended to diagnose MRSA     infection nor to guide or     monitor treatment for     MRSA infections.  CULTURE, RESPIRATORY (NON-EXPECTORATED)     Status: None   Collection Time    08/03/13  4:15 AM      Result Value Ref Range Status   Specimen Description TRACHEAL ASPIRATE   Final   Special Requests NONE   Final   Gram Stain     Final   Value: NO WBC SEEN     NO SQUAMOUS EPITHELIAL CELLS SEEN     NO ORGANISMS SEEN     Performed at [facility]  Final   Value: FEW CANDIDA ALBICANS     Performed at [facility]   Report Status 08/05/2013 FINAL   Final  CULTURE, BLOOD (ROUTINE X 2)     Status: None   Collection Time    08/03/13  4:55 AM      Result Value Ref Range Status   Specimen Description BLOOD LEFT ARM   Final   Special Requests BOTTLES DRAWN AEROBIC AND ANAEROBIC 10CC EACH   Final   Culture  Setup Time     Final   Value: 08/03/2013 08:45     Performed at [facility]   Culture     Final   Value:        BLOOD CULTURE RECEIVED NO GROWTH TO DATE CULTURE WILL BE HELD FOR 5 DAYS BEFORE ISSUING A FINAL NEGATIVE REPORT     Performed at [facility]   Report Status PENDING   Incomplete  CULTURE, BLOOD (ROUTINE X 2)     Status: None   Collection Time    08/03/13  5:12 AM      Result Value Ref Range Status   Specimen Description BLOOD LEFT HAND   Final   Special Requests BOTTLES DRAWN AEROBIC AND ANAEROBIC 10CC   Final   Culture  Setup Time     Final   Value: 08/03/2013 08:45     Performed at [facility]   Culture     Final   Value:        BLOOD CULTURE RECEIVED NO GROWTH TO DATE CULTURE WILL BE HELD FOR 5 DAYS  BEFORE ISSUING A FINAL NEGATIVE REPORT     Performed at [facility]   Report Status PENDING   Incomplete   Assessment: 73 yo female with fevers and septic shock started on empiric antibiotics. Currently remains only on Zosyn. Renal function is stable with CrCl ~55-82mL/min. Only growth has been few candida albicans in respiratory culture, blood remains NGTD. WBC still elevated at 16.1, Tmax/24h 100.2  Goal of Therapy:  Eradication of infection Proper dosing based on hepatic and renal function  Plan: 1. Continue Zosyn 3.375 g IV q8h EI 2. Follow c/s, clinical progression, LOT  [name], PharmD, BCPS Clinical Pharmacist Pager: [phone] 08/05/2013 1:58 PM

## 2013-08-05 NOTE — Progress Notes (Signed)
I have seen and examined the patient and agree with the assessment and plans. Making some improvement.  If WBC remains elevated, will CT the abdomen with contrast to r/o abscess or leak  [name] A. Ninfa Linden  MD, FACS

## 2013-08-05 NOTE — Progress Notes (Signed)
PARENTERAL NUTRITION CONSULT NOTE - Follow-up  Pharmacy Consult for TPN Indication: Bowel Obstruction  Allergies  Allergen Reactions  . Pregabalin Swelling    Tongue swelling  . Sulfonamide Derivatives Swelling    Childhood reaction   Patient Measurements: Height: 5' (152.4 cm) Weight: 171 lb 4.8 oz (77.7 kg) IBW/kg (Calculated) : 45.5  Vital Signs: Temp: 98.5 F (36.9 C) (07/17 0645) Temp src: Oral (07/17 0645) BP: 115/49 mmHg (07/17 0600) Pulse Rate: 85 (07/17 0600) Intake/Output from previous day: 07/16 0701 - 07/17 0700 In: 3256.8 [I.V.:1364.3; Blood:12.5; IV Piggyback:200; TPN:1680] Out: 2100 [Urine:1900; Stool:200] Intake/Output from this shift:  Labs:  Recent Labs  08/03/13 0647 08/03/13 1930 08/05/13 0452  WBC 14.2* 16.7* 16.1*  HGB 8.2* 8.2* 6.9*  HCT 25.8* 25.5* 21.9*  PLT 264 279 206    Recent Labs  08/03/13 0400 08/04/13 0341 08/05/13 0452  NA 137 132* 132*  K 3.6* 4.3 4.0  CL 103 100 99  CO2 20 18* 22  GLUCOSE 121* 220* 128*  BUN 19 27* 35*  CREATININE 0.91 0.91 0.67  CALCIUM 8.0* 7.9* 8.6  MG 1.5 2.0  --   PHOS 2.5 3.5  --   PROT  --  4.3*  --   ALBUMIN  --  1.5*  --   AST  --  33  --   ALT  --  12  --   ALKPHOS  --  55  --   BILITOT  --  0.3  --    Estimated Creatinine Clearance: 56.9 ml/min (by C-G formula based on Cr of 0.67).   CBG (last 3)   Recent Labs  08/04/13 1148 08/04/13 1607 08/04/13 1932  GLUCAP 200* 169* 131*    Insulin Requirements in the past 24 hours:  24 units Novolog SSI; 100 units regular insulin in TPN (pt receives about 72 units)  Current Nutrition:  Clinimix E 5/15 at 60 ml/hr and 20% lipids at 10 ml/hr provides 72 gm protein and 1502 kcal.  Nutritional Goals:  1750-1900 kcal, 80-95 gm protein (per RD note 7/13)  Assessment: Admit: 73 yo female admitted with c/o abdominal pain associated with N&V.  She has been admitted to [facility] multiple times over the past few months with a similar  complaint.  Of note, past surgical history reveals an appendectomy, cholecystectomy, colon resection and an abdominal hysterectomy.  GI: Pt with prolonged obstruction and expect prolonged post-op ileus. S/p exp lap/LOA/partial colectomy/end-colostomy, small bowel resection, repair of multiple enterotomies 7/13. NPO with TPN. Baseline prealbumin 9 (low).  Endo: No h/o DM. CBGs better with increased insulin in TPN. Pt continues on resistant SSI with insulin in TPN. Steroids added 7/15 so probably exacerbating hyperglycemia.  Lytes: Na low, Corrected Ca 10.6 (low alb 1.5), other lytes wnl.   Renal: SCr stable.  UOP 52mL/kg/hr.   Pulm: Remains intubated. Fio2 60%.  Cards: CAD, HTN. Remains on pheynyephrine and vasopressin.   Hepatobil: LFTs wnl. TG 96.  Neuro: Sedated with fent gtt. GCS 13. h/o depression. Sertraline PTA  ID: On Zosyn for sepsis. Wbc up to 16.7. PCT decreasing to 1.02.  Best Practices: Lovenox, PPI IV (home med)  Home meds: Will need resumed once pt able to take po  TPN Access: Double lumen PICC placed 7/12  TPN day#: 5  Plan:  1.  Increase Clinimix E 5/15 to goal of 77ml/hr and continue 20% lipids at 10 ml/hr. This will provide 1843 kcal and 96g protein 2.  Continue resistant SSI coverage  Heide Guile, PharmD, BCPS Clinical Pharmacist Pager [phone]   08/05/2013,8:03 AM

## 2013-08-05 NOTE — Progress Notes (Signed)
Pt found to have DVT Rt axillary vein.  Pt with concern for GI bleeding and dropping hemoglobin.  Defer anticoagulation for now.  PICC line functional >> will keep in place.  Chesley Mires, MD [facility] Pulmonary/Critical Care 08/05/2013, 4:56 PM Pager:  [phone] After 3pm call: [phone]

## 2013-08-05 NOTE — Progress Notes (Signed)
PULMONARY / CRITICAL CARE MEDICINE   Name: [name] MRN: [id number] DOB: 1940-06-16    ADMISSION DATE:  07/29/2013 CONSULTATION DATE:  08/01/2013  REFERRING MD :  Surgery PRIMARY SERVICE: PCCM  CHIEF COMPLAINT:  Bowel obstruction  BRIEF PATIENT DESCRIPTION: 73 y/o female with PMH CAD, HTN, Barrett's esophagus, presented to [facility] ED 7/10 with LLQ pain. Found to have bowel obstruction. To OR for repair 7/13. Remains of vent post-op. Consulted PCCM.  SIGNIFICANT EVENTS / STUDIES: 7/10 Admit, CT Abd - dilated prox colon w/ abrupt transition c/w with obstruction, diverticulitis vs colitis, Surgery c/s 7/12 Empiric Cipro/Flagyl, Cardiology consulted for pre-op evaluation, TPN per pharm 7/13 - Laparotomy, partial colectomy, colostomy, SB resection, repair of enterotomy, lysis of adhesions         - Hgb down to 6.8 >> imp to 10.8 s/p 2u PRBC 7/14 - Extubation, CXR stable, Hgb 9.5, o/n tachycardic >> Cards start Esmolol, IVF         - o/n required re-intubation due to desat, decompsenate, hypoxemic resp failure, hypotensive - concern aspiration         - DC Esmolol, start Vaso, switch to Vanc / Zosyn 7/15 - CXR (worsening bilateral airspace disease, suspect edema), concern for volume overload despite CVP 5-6         - Add Vaso, Trop (neg), BNP 3782, PCT 1.83, ECHO (normal LVEF) 7/16 - Considered ARDS (unclear etiology, ?aspiration), CXR (persistent b/l edema), cont pressors, stable low PCT 1.9 7/17 - o/n Hgb 8.2 >> 6.9, received 1u PRBC, titrating down Neo, CVP 15-16         - Off Neo, remains on Vaso, CXR (mild improved b/l pulm edema)  LINES / TUBES: #1 OETT 7/13 >>> 7/14 #2 OETT 7/14 >>>  Colostomy 7/13 >>> PICC 7/12 >>> NGT 7/10 >>>  CULTURES: Blood x 2, 7/15 >>> Respiratory 7/15 >> negative (candida)  ANTIBIOTICS: Ceftriaxone 7/10 >>> 7/14 Flagyl 7/10 >>> 7/14 Vancomycin 7/14 >>> 7/16 Fluconazole 7/10 >>> 7/16 Zosyn 7/14 >>>  SUBJECTIVE:  Overnight with improved UOP  (1.9 L) s/p Lasix 40 IV x 1 dose, titrating down on pressors. Remains sedated on vent, today improved following commands. Abdominal dressing dry / recently changed, ostomy functioning appropriately with brown liquid stool. Family at bedside.  VITAL SIGNS: Temp:  [98.4 F (36.9 C)-99.9 F (37.7 C)] 98.6 F (37 C) (07/17 0845) Pulse Rate:  [72-90] 81 (07/17 1216) Resp:  [15-28] 25 (07/17 1216) BP: (91-134)/(35-70) 91/50 mmHg (07/17 1216) SpO2:  [96 %-100 %] 100 % (07/17 1216) Arterial Line BP: (93-140)/(42-72) 93/50 mmHg (07/17 1200) FiO2 (%):  [50 %-70 %] 50 % (07/17 1216) HEMODYNAMICS: CVP:  [11 mmHg-19 mmHg] 11 mmHg VENTILATOR SETTINGS: Vent Mode:  [-] PRVC FiO2 (%):  [50 %-70 %] 50 % Set Rate:  [25 bmp] 25 bmp Vt Set:  [480 mL] 480 mL PEEP:  [8 cmH20-12 cmH20] 8 cmH20 Plateau Pressure:  [20 cmH20-32 cmH20] 23 cmH20 INTAKE / OUTPUT: Intake/Output     07/16 0701 - 07/17 0700 07/17 0701 - 07/18 0700   I.V. (mL/kg) 1364.3 (17.6) 91.7 (1.2)   Blood 12.5    NG/GT     IV Piggyback 200    TPN 1680    Total Intake(mL/kg) 3256.8 (41.9) 91.7 (1.2)   Urine (mL/kg/hr) 1900 (1) 175 (0.4)   Stool 200 (0.1)    Total Output 2100 175   Net +1156.8 -83.4          PHYSICAL EXAMINATION: General:  On vent Neuro:  Sedated RASS -2, following some commands Cardiovascular:  Tachycardic, regular rhythm, no murmurs Lungs:  Improved bilateral coarse breath sounds Abdomen:  Soft, abd dressing in place. LLQ colostomy (brown stool), appears healthy with viable tissue, +hypoactive BS Ext:  Bilateral lower ext cool, trace distal pulses, generalized +2 pitting edema upper/lower ext Skin:  Resolved urticaria  LABS:  CBC  Recent Labs Lab 08/03/13 0647 08/03/13 1930 08/05/13 0452  WBC 14.2* 16.7* 16.1*  HGB 8.2* 8.2* 6.9*  HCT 25.8* 25.5* 21.9*  PLT 264 279 206   Coag's  Recent Labs Lab 08/01/13 1930  INR 1.25   BMET  Recent Labs Lab 08/03/13 0400 08/04/13 0341 08/05/13 0452   NA 137 132* 132*  K 3.6* 4.3 4.0  CL 103 100 99  CO2 20 18* 22  BUN 19 27* 35*  CREATININE 0.91 0.91 0.67  GLUCOSE 121* 220* 128*   Electrolytes  Recent Labs Lab 08/01/13 0400  08/03/13 0400 08/04/13 0341 08/05/13 0452  CALCIUM 8.1*  < > 8.0* 7.9* 8.6  MG 2.0  --  1.5 2.0  --   PHOS 2.3  --  2.5 3.5  --   < > = values in this interval not displayed. Sepsis Markers  Recent Labs Lab 07/29/13 1624 08/03/13 0930 08/04/13 0341 08/05/13 0453  LATICACIDVEN 0.86  --   --   --   PROCALCITON  --  1.83 1.97 1.02   ABG  Recent Labs Lab 08/03/13 0422 08/04/13 0413 08/04/13 1130  PHART 7.303* 7.206* 7.235*  PCO2ART 42.3 49.8* 49.2*  PO2ART 62.9* 52.0* 130.0*   Liver Enzymes  Recent Labs Lab 07/29/13 1605 08/01/13 0400 08/04/13 0341  AST 14 12 33  ALT 13 10 12   ALKPHOS 82 72 55  BILITOT 0.4 0.4 0.3  ALBUMIN 2.9* 2.3* 1.5*   Cardiac Enzymes  Recent Labs Lab 07/29/13 1605 08/03/13 0930  TROPONINI <0.30 <0.30  PROBNP  --  3782.0*   Glucose  Recent Labs Lab 08/04/13 0343 08/04/13 0806 08/04/13 1148 08/04/13 1607 08/04/13 1932 08/05/13 0806  GLUCAP 220* 211* 200* 169* 131* 103*    Imaging - noted   ASSESSMENT / PLAN:  PULMONARY A: Acute respiratory failure (post-op), req re-intubation 7/14 following extubation with hypoxemic failure - On vent ARDS, unclear etiology, question aspiration - 7/17 CXR improved edema Ruled out Cardiogenic Etiology of Pulm Edema - 7/15 ECHO (nml LVEF) CAP, presumed P:   Full mech vent support, ARDS protocol for PEEP/FIO2 titration, currently FiO2 50%, titrate down PEEP BDs PRN Pulmonary hygiene Repeat CXR in AM Hold Lasix in setting of ARDS at this time while remains on pressors, if able to come off Vaso today, can initiate Lasix  CARDIOVASCULAR A:  Shock, consistent with Septic etiology, presumed CAP source - Titrating off pressors Hemorrhagic Shock - Resolved, now with some decreasing Hgb (no overt  bleeding) Tachycardia H/o CAD, HTN Elevated BNP, but Preserved LVEF - No evidence cardiogenic shock - 7/15 ECHO (nml EF) P:  Pressor support with goal MAP >65, off Neo Continue Vaso, attempt to discontinue today to see if tolerated (as above, recommend Lasix once off pressors) KVO IVF Hold plavix, lipitor, ASA Cardiology following  RENAL A:   Non-gap Metabolic Acidosis Hyponatremia Hypokalemia - Resolved P:   BMET in AM Replace electrolytes as needed  GASTROINTESTINAL A:   Suspected colitis with colonic obstruction/SBO s/p partial colectomy with new colostomy 7/13 Ileus, post-op Hx of Barrett's esophagus Nutrition P:   Post op care,  nutrition per surgery, remains on TPN Surgery hopeful of start tube feeds if remains intubated over next few days Wound/ostomy care consult Continue PPI CT abdomen/pelvis with PO/IV contrast for bowel leak  HEMATOLOGIC A:   Acute blood loss anemia, post-op - Decreased Hgb 8.2 >> 6.9 (1u PRBC), concern for intra-abdominal bleed RUE edema, unlikely DVT - pending Doppler VTE ppx P:  Consider abdominal imaging with CT today vs tomorrow, investigate post-op abd to see if bleeding source identified SCD's CBC daily Hold enoxaparin, asa, plavix  Transfuse for Hb < 7 or bleeding  INFECTIOUS A:   Colitis, s/p partial colectomy Presumed CAP, question aspiration PNA - Stable low PCT, elevated WBC 16.7 P:   Antibiotics and micro as above F/u fever curve PCT Stable, no longer trend  ENDOCRINE A: Septic Shock, requiring stress dose steroids   Hyperglycemia - improved No hx DM P:   Solucortef 50mg  IV q 6 hr, continue while on pressors, then wean off SSI Adjust insulin per pharm with TPN  NEUROLOGIC A:   Acute encephalopathy in post operative setting P:   RASS goal: -1, may need deeper sedation with IV versed for vent synchrony PRN fentanyl for sedation Restraints  DERMATOLOGY A: Urticaria >> suspect may be related to diflucan vs  opiates - Resolved P: Monitor PRN benadryl  GLOBAL: Improved prognosis today, remains intubated continue wean, only pressor left is Vaso (attempt trial off today), if hemodynamically stable off pressors, recommend starting Lasix in setting of ARDS. Concern with recurrent drop in Hgb, s/p 1u PRBC, consider Abd CT today to eval for intra-abdominal bleeding post-op. Surgery following, remains on TPN, post-op ileus otherwise well functioning ostomy.  Husband updated in detail at bedside.  Nobie Putnam, [place], PGY-2  Care during the described time interval was provided by me and/or other providers on the critical care team.  I have reviewed this patient's available data, including medical history, events of note, physical examination and test results as part of my evaluation  CC time x 39m  Opal Mckellips V. MD  08/05/2013, 12:30 PM

## 2013-08-06 ENCOUNTER — Inpatient Hospital Stay (HOSPITAL_COMMUNITY): Payer: Medicare HMO

## 2013-08-06 LAB — TYPE AND SCREEN
ABO/RH(D): B POS
Antibody Screen: NEGATIVE
Unit division: 0

## 2013-08-06 LAB — CBC
HCT: 25.2 % — ABNORMAL LOW (ref 36.0–46.0)
Hemoglobin: 8.3 g/dL — ABNORMAL LOW (ref 12.0–15.0)
MCH: 28.7 pg (ref 26.0–34.0)
MCHC: 32.9 g/dL (ref 30.0–36.0)
MCV: 87.2 fL (ref 78.0–100.0)
Platelets: 188 10*3/uL (ref 150–400)
RBC: 2.89 MIL/uL — ABNORMAL LOW (ref 3.87–5.11)
RDW: 15.5 % (ref 11.5–15.5)
WBC: 19.5 10*3/uL — ABNORMAL HIGH (ref 4.0–10.5)

## 2013-08-06 LAB — GLUCOSE, CAPILLARY
GLUCOSE-CAPILLARY: 107 mg/dL — AB (ref 70–99)
GLUCOSE-CAPILLARY: 109 mg/dL — AB (ref 70–99)
GLUCOSE-CAPILLARY: 97 mg/dL (ref 70–99)
GLUCOSE-CAPILLARY: 94 mg/dL (ref 70–99)
Glucose-Capillary: 110 mg/dL — ABNORMAL HIGH (ref 70–99)
Glucose-Capillary: 115 mg/dL — ABNORMAL HIGH (ref 70–99)

## 2013-08-06 LAB — BASIC METABOLIC PANEL
Anion gap: 11 (ref 5–15)
BUN: 32 mg/dL — ABNORMAL HIGH (ref 6–23)
CALCIUM: 9.4 mg/dL (ref 8.4–10.5)
CO2: 24 mEq/L (ref 19–32)
Chloride: 105 mEq/L (ref 96–112)
Creatinine, Ser: 0.54 mg/dL (ref 0.50–1.10)
GFR calc Af Amer: >90 mL/min (ref >90)
GLUCOSE: 105 mg/dL — AB (ref 70–99)
Potassium: 3.7 mEq/L (ref 3.7–5.3)
SODIUM: 140 meq/L (ref 137–147)

## 2013-08-06 MED ORDER — MIDAZOLAM HCL 2 MG/2ML IJ SOLN
1.0000 mg | INTRAMUSCULAR | Status: AC | PRN
  Administered 2013-08-06 – 2013-08-07 (×3): 1 mg via INTRAVENOUS
  Filled 2013-08-06: qty 2

## 2013-08-06 MED ORDER — MIDAZOLAM HCL 2 MG/2ML IJ SOLN
1.0000 mg | INTRAMUSCULAR | Status: DC | PRN
  Administered 2013-08-07 – 2013-08-08 (×7): 1 mg via INTRAVENOUS
  Filled 2013-08-06 (×8): qty 2

## 2013-08-06 MED ORDER — FAT EMULSION 20 % IV EMUL
250.0000 mL | INTRAVENOUS | Status: AC
  Administered 2013-08-06: 250 mL via INTRAVENOUS
  Filled 2013-08-06: qty 250

## 2013-08-06 MED ORDER — M.V.I. ADULT IV INJ
INTRAVENOUS | Status: AC
  Administered 2013-08-06: 18:00:00 via INTRAVENOUS
  Filled 2013-08-06: qty 2000

## 2013-08-06 NOTE — Progress Notes (Signed)
RT changed patient's aline dressing.RN notified. Husband at bedside. No complications. Vital signs stable at this time. RT will continue to monitor.

## 2013-08-06 NOTE — Progress Notes (Signed)
5 Days Post-Op  Subjective: Pt with no acute changes. Weaning slowly on vent  Objective: Vital signs in last 24 hours: Temp:  [97.5 F (36.4 C)-98.3 F (36.8 C)] 97.9 F (36.6 C) (07/18 0750) Pulse Rate:  [72-127] 108 (07/18 0900) Resp:  [16-25] 22 (07/18 0900) BP: (63-124)/(37-82) 121/82 mmHg (07/18 0900) SpO2:  [95 %-100 %] 100 % (07/18 0900) Arterial Line BP: (84-136)/(43-105) 117/56 mmHg (07/18 0900) FiO2 (%):  [40 %-50 %] 40 % (07/18 0900) Last BM Date: 08/06/13  Intake/Output from previous day: 07/17 0701 - 07/18 0700 In: 1632.8 [I.V.:282.8; IV Piggyback:100; TPN:1170] Out: 6010 [Urine:4050; Stool:425] Intake/Output this shift: Total I/O In: 205 [I.V.:25; TPN:180] Out: 285 [Urine:285]  General appearance: alert Cardio: regular rate and rhythm, S1, S2 normal, no murmur, click, rub or gallop GI: abnormal findings:  absent bowel sounds, ostomy patent   Lab Results:   Recent Labs  08/05/13 0452 08/06/13 0449  WBC 16.1* 19.5*  HGB 6.9* 8.3*  HCT 21.9* 25.2*  PLT 206 188   BMET  Recent Labs  08/05/13 0452 08/06/13 0449  NA 132* 140  K 4.0 3.7  CL 99 105  CO2 22 24  GLUCOSE 128* 105*  BUN 35* 32*  CREATININE 0.67 0.54  CALCIUM 8.6 9.4   PT/INR No results found for this basename: LABPROT, INR,  in the last 72 hours ABG  Recent Labs  08/04/13 0413 08/04/13 1130  PHART 7.206* 7.235*  HCO3 19.7* 20.6    Studies/Results: Ct Abdomen Pelvis W Contrast  08/05/2013   CLINICAL DATA:  Several days postop from colostomy for bowel leak. Decreased hemoglobin. Evaluate for postop hematoma or abscess  EXAM: CT ABDOMEN AND PELVIS WITH CONTRAST  TECHNIQUE: Multidetector CT imaging of the abdomen and pelvis was performed using the standard protocol following bolus administration of intravenous contrast.  CONTRAST:  84mL OMNIPAQUE IOHEXOL 300 MG/ML  SOLN  COMPARISON:  07/29/2013  FINDINGS: Images through the lung bases show increased small bilateral pleural  effusions, bibasilar atelectasis, and probable bibasilar pulmonary edema. Patient has undergone transverse colostomy since prior study.  There is no evidence of bowel obstruction. Diffuse mesenteric and body wall edema is seen. Small amount of free fluid seen along the left paracolic gutter and in the central pelvis, however no abscess identified. There is no evidence of retroperitoneal hemorrhage or focal hematoma.  Foley catheter is seen within the bladder. Prior hysterectomy noted. Adnexal regions are unremarkable in appearance. No focal inflammatory process or mass identified.  The liver, spleen, pancreas, adrenal glands, and kidneys are normal in appearance except for a tiny right renal cyst. No evidence of hydronephrosis.  IMPRESSION: Expected postop changes from transverse colostomy. No evidence of retroperitoneal hemorrhage.  Small amount of free fluid in left abdomen and pelvis, however no focal abscess identified.  Diffuse mesenteric and body wall edema.  Increased small bilateral pleural effusions, bibasilar atelectasis, and probable diffuse pulmonary edema.   Electronically Signed   By: Earle Gell M.D.   On: 08/05/2013 17:48   Dg Chest Port 1 View  08/06/2013   CLINICAL DATA:  Intubated, pneumonia  EXAM: PORTABLE CHEST - 1 VIEW  COMPARISON:  Prior chest x-ray 08/05/2013  FINDINGS: Endotracheal tube is 5.6 cm above the carina. The tip of the nasogastric tube overlies the gastric bubble. Right IJ approach central venous catheter with the tip at the superior cavoatrial junction. Right upper extremity approach PICC with the catheter tip in the mid SVC. Stable cardiac and mediastinal contours. Atherosclerotic calcifications  present in the transverse aorta.  Very similar appearance of the lungs although there is a slightly decreased interstitial prominence suggesting and improvement in interstitial pulmonary edema. Persistent bibasilar opacities. No pneumothorax or new acute abnormality. No acute osseous  abnormality.  IMPRESSION: 1. Stable and satisfactory support apparatus. 2. Slight interval improvement in pulmonary interstitial edema. 3. Persistent bibasilar airspace opacities which remain nonspecific and may reflect infiltrate, atelectasis and/or small layering effusions.   Electronically Signed   By: Jacqulynn Cadet M.D.   On: 08/06/2013 07:36   Dg Chest Port 1 View  08/05/2013   CLINICAL DATA:  ARDS.  EXAM: PORTABLE CHEST - 1 VIEW  COMPARISON:  08/04/2013  FINDINGS: Endotracheal tube ends in the mid thoracic trachea. The orogastric tube reaches the stomach. Right IJ catheter and right upper extremity PICC tip are at the level of the SVC.  Mild cardiomegaly is stable. There is diffuse interstitial coarsening which is improved from previous. Possible small left pleural effusion. No evidence of pneumothorax.  IMPRESSION: 1. Tubes and lines remain in good position. 2. Mild improvement in pulmonary edema.   Electronically Signed   By: Jorje Guild M.D.   On: 08/05/2013 06:57    Anti-infectives: Anti-infectives   Start     Dose/Rate Route Frequency Ordered Stop   08/03/13 1800  vancomycin (VANCOCIN) IVPB 750 mg/150 ml premix  Status:  Discontinued     750 mg 150 mL/hr over 60 Minutes Intravenous Every 12 hours 08/03/13 0440 08/04/13 0910   08/03/13 0500  vancomycin (VANCOCIN) 1,250 mg in sodium chloride 0.9 % 250 mL IVPB     1,250 mg 166.7 mL/hr over 90 Minutes Intravenous  Once 08/03/13 0440 08/03/13 0646   08/03/13 0500  piperacillin-tazobactam (ZOSYN) IVPB 3.375 g     3.375 g 12.5 mL/hr over 240 Minutes Intravenous 3 times per day 08/03/13 0440     08/01/13 1245  metroNIDAZOLE (FLAGYL) IVPB 500 mg     500 mg 100 mL/hr over 60 Minutes Intravenous To Surgery 08/01/13 1238 08/01/13 1300   07/31/13 0800  cefoTEtan (CEFOTAN) 2 g in dextrose 5 % 50 mL IVPB     2 g 100 mL/hr over 30 Minutes Intravenous On call to O.R. 07/31/13 0741 08/01/13 0559   07/31/13 0800  cefTRIAXone (ROCEPHIN) 2 g in  dextrose 5 % 50 mL IVPB  Status:  Discontinued    Comments:  Pharmacy may adjust dosing strength / duration / interval for maximal efficacy   2 g 100 mL/hr over 30 Minutes Intravenous Every 24 hours 07/31/13 0744 08/03/13 0405   07/29/13 2100  ciprofloxacin (CIPRO) IVPB 400 mg  Status:  Discontinued     400 mg 200 mL/hr over 60 Minutes Intravenous Every 12 hours 07/29/13 2058 07/31/13 0744   07/29/13 2100  metroNIDAZOLE (FLAGYL) IVPB 500 mg  Status:  Discontinued     500 mg 100 mL/hr over 60 Minutes Intravenous Every 8 hours 07/29/13 2058 08/03/13 0405   07/29/13 2100  fluconazole (DIFLUCAN) IVPB 200 mg  Status:  Discontinued     200 mg 100 mL/hr over 60 Minutes Intravenous Every 24 hours 07/29/13 2058 08/04/13 0911   07/29/13 1930  ciprofloxacin (CIPRO) IVPB 400 mg     400 mg 200 mL/hr over 60 Minutes Intravenous  Once 07/29/13 1915 07/29/13 2031   07/29/13 1930  metroNIDAZOLE (FLAGYL) IVPB 500 mg  Status:  Discontinued     500 mg 100 mL/hr over 60 Minutes Intravenous  Once 07/29/13 1915 07/29/13 2107  Assessment/Plan: s/p Procedure(s): EXPLORATORY LAPAROTOMY (N/A) PARTIAL COLECTOMY (N/A) COLOSTOMY (N/A) SMALL BOWEL RESECTION (N/A) 1. POD 5, EXPLORATORY LAPAROTOMY (N/A)  PARTIAL COLECTOMY (N/A)  COLOSTOMY (N/A)  SMALL BOWEL RESECTION (N/A)  2. Postoperative ileus  3. PCM, TNA  4. Ventilator dependent respiratory failure  5. ARDS   CT done yesterday shows no abscess.   Con't abx. Con't to wean vent as tol TNA   LOS: 8 days    Rosario Jacks., [facility] 08/06/2013

## 2013-08-06 NOTE — Progress Notes (Signed)
PARENTERAL NUTRITION CONSULT NOTE - Follow-up  Pharmacy Consult for TPN Indication: Bowel Obstruction  Allergies  Allergen Reactions  . Pregabalin Swelling    Tongue swelling  . Sulfonamide Derivatives Swelling    Childhood reaction   Patient Measurements: Height: 5' (152.4 cm) Weight: 171 lb 4.8 oz (77.7 kg) IBW/kg (Calculated) : 45.5  Vital Signs: Temp: 98 F (36.7 C) (07/18 0422) Temp src: Oral (07/18 0422) BP: 118/51 mmHg (07/18 0400) Pulse Rate: 83 (07/18 0600) Intake/Output from previous day: 07/17 0701 - 07/18 0700 In: 1480.3 [I.V.:270.3; IV Piggyback:50; TPN:1080] Out: 9767 [Urine:3900; Stool:425] Intake/Output from this shift:  Labs:  Recent Labs  08/03/13 1930 08/05/13 0452 08/06/13 0449  WBC 16.7* 16.1* 19.5*  HGB 8.2* 6.9* 8.3*  HCT 25.5* 21.9* 25.2*  PLT 279 206 188    Recent Labs  08/04/13 0341 08/05/13 0452 08/06/13 0449  NA 132* 132* 140  K 4.3 4.0 3.7  CL 100 99 105  CO2 18* 22 24  GLUCOSE 220* 128* 105*  BUN 27* 35* 32*  CREATININE 0.91 0.67 0.54  CALCIUM 7.9* 8.6 9.4  MG 2.0  --   --   PHOS 3.5  --   --   PROT 4.3*  --   --   ALBUMIN 1.5*  --   --   AST 33  --   --   ALT 12  --   --   ALKPHOS 55  --   --   BILITOT 0.3  --   --    Estimated Creatinine Clearance: 56.9 ml/min (by C-G formula based on Cr of 0.54).   CBG (last 3)   Recent Labs  08/05/13 1938 08/06/13 0008 08/06/13 0418  GLUCAP 134* 110* 115*    Insulin Requirements in the past 24 hours:  3 units Novolog SSI; 100 units regular insulin in TPN (pt receives about 72 units)  Current Nutrition:  Clinimix E 5/15 at 80 ml/hr and 20% lipids at 10 ml/hr provides 1843 kcal and 96g protein  Nutritional Goals:  1750-1900 kcal, 80-95 gm protein (per RD note 7/13)  Assessment: Admit: 73 yo female admitted with c/o abdominal pain associated with N&V.  She has been admitted to [facility] multiple times over the past few months with a similar complaint.  Of  note, past surgical history reveals an appendectomy, cholecystectomy, colon resection and an abdominal hysterectomy.  GI: Pt with prolonged obstruction and expect prolonged post-op ileus. S/p exp lap/LOA/partial colectomy/end-colostomy, small bowel resection, repair of multiple enterotomies 7/13. NPO with TPN. Baseline prealbumin 9 (low).  Endo: No h/o DM. CBGs well-controlled with increased insulin in TPN. Pt continues on resistant SSI with insulin in TPN. Steroids added 7/15 so probably exacerbating hyperglycemia.  Lytes: Na 140, K 3.7   Renal: SCr stable.  UOP 3L yesterday  Pulm: Remains intubated. Fio2 40%.  Cards: CAD, HTN. Remains on pheynyephrine   Hepatobil: LFTs wnl. TG 96.  Neuro: Sedated with fent gtt. GCS 12. h/o depression. Sertraline PTA  ID: On Zosyn for sepsis. Wbc up to 19.7. PCT decreasing to 1.02.  Best Practices: Lovenox, PPI IV (home med)  Home meds: Will need resumed once pt able to take po  TPN Access: Double lumen PICC placed 7/12  TPN day#: 6  Plan:  1.  Continue Clinimix E 5/15 to goal of 1ml/hr and continue 20% lipids at 10 ml/hr. This will provide 1843 kcal and 96g protein 2.  Continue resistant SSI coverage   Heide Guile, PharmD, BCPS Clinical  Pharmacist Pager [phone]   08/06/2013,7:22 AM

## 2013-08-06 NOTE — Progress Notes (Addendum)
PULMONARY / CRITICAL CARE MEDICINE   Name: [name] MRN: [id number] DOB: 01-Oct-1940    ADMISSION DATE:  07/29/2013 CONSULTATION DATE:  08/01/2013  REFERRING MD :  Surgery PRIMARY SERVICE: PCCM  CHIEF COMPLAINT:  Bowel obstruction  BRIEF PATIENT DESCRIPTION: 73 y/o female with PMH CAD, HTN, Barrett's esophagus, presented to [facility] ED 7/10 with LLQ pain. Found to have bowel obstruction. To OR for repair 7/13. Remains of vent post-op. Consulted PCCM.  SIGNIFICANT EVENTS / STUDIES: 7/10 Admit, CT Abd - dilated prox colon w/ abrupt transition c/w with obstruction, diverticulitis vs colitis, Surgery c/s 7/12 Empiric Cipro/Flagyl, Cardiology consulted for pre-op evaluation, TPN per pharm 7/13 - Laparotomy, partial colectomy, colostomy, SB resection, repair of enterotomy, lysis of adhesions         - Hgb down to 6.8 >> imp to 10.8 s/p 2u PRBC 7/14 - Extubation, CXR stable, Hgb 9.5, o/n tachycardic >> Cards start Esmolol, IVF         - o/n required re-intubation due to hypoxemic resp failure, hypotension, concern for aspiration         - DC Esmolol, start Vaso, switch to Vanc / Zosyn 7/15 - CXR (worsening bilateral airspace disease, suspect edema), concern for volume overload despite CVP 5-6         - Add Vaso, Trop (neg), BNP 3782, PCT 1.83, ECHO (normal LVEF) 7/16 - Considered ARDS (unclear etiology, ?aspiration), CXR (persistent b/l edema), cont pressors, stable low PCT 1.9 7/17 - o/n Hgb 8.2 >> 6.9, received 1u PRBC, titrating down Neo, CVP 15-16         - Off Neo, remains on Vaso, CXR (mild improved b/l pulm edema) 7/17 CT abd >no bleeding , no abscess, sm fluid in abd/pelvis  7/17 Ven Dopp>+DVT Rt axillary vein   LINES / TUBES: ETT 7/13 >> 7/14, 7/14 >>  PICC 7/12 >>  R IJ CVL 7/14 >>  L radial arterial cath 7/15 >>   CULTURES: Blood x 2, 7/15 >> NEG Respiratory 7/15 >> few Candida  ANTIBIOTICS: Ceftriaxone 7/10 >>> 7/14 Flagyl 7/10 >>> 7/14 Vancomycin 7/14 >>>  7/16 Fluconazole 7/10 >>> 7/16 Zosyn 7/14 >>   SUBJECTIVE:  Remains on pressor support -now off vaso  Follows intermittent commands, w/ on/off agitation.  VITAL SIGNS: Temp:  [97.5 F (36.4 C)-98.3 F (36.8 C)] 97.9 F (36.6 C) (07/18 0750) Pulse Rate:  [72-127] 108 (07/18 0900) Resp:  [16-25] 22 (07/18 0900) BP: (63-124)/(37-82) 121/82 mmHg (07/18 0900) SpO2:  [95 %-100 %] 100 % (07/18 0900) Arterial Line BP: (84-136)/(43-105) 117/56 mmHg (07/18 0900) FiO2 (%):  [40 %-50 %] 40 % (07/18 0900) HEMODYNAMICS: CVP:  [11 mmHg-31 mmHg] 13 mmHg VENTILATOR SETTINGS: Vent Mode:  [-] CPAP;PSV FiO2 (%):  [40 %-50 %] 40 % Set Rate:  [25 bmp] 25 bmp Vt Set:  [450 mL-480 mL] 450 mL PEEP:  [5 cmH20-8 cmH20] 5 cmH20 Pressure Support:  [5 cmH20] 5 cmH20 Plateau Pressure:  [18 cmH20-27 cmH20] 18 cmH20 INTAKE / OUTPUT: Intake/Output     07/17 0701 - 07/18 0700 07/18 0701 - 07/19 0700   I.V. (mL/kg) 282.8 (3.6) 25 (0.3)   Blood     Other 80    IV Piggyback 100    TPN 1170 180   Total Intake(mL/kg) 1632.8 (21) 205 (2.6)   Urine (mL/kg/hr) 4050 (2.2) 285 (1.2)   Stool 425 (0.2)    Total Output 4475 285   Net -2842.2 -80  PHYSICAL EXAMINATION: General:  On vent Neuro:  Sedated RASS -1-0, following some commands Cardiovascular:  Tachycardic, regular rhythm, no murmurs Lungs:  Bibasilar crackles  Abdomen:  Soft, abd dressing in place. LLQ colostomy (brown stool), appears healthy with viable tissue, +hypoactive BS Ext:  Bilateral lower ext cool, trace distal pulses, generalized +2 pitting edema upper/lower ext Skin:  Resolved urticaria  LABS:  CBC  Recent Labs Lab 08/03/13 1930 08/05/13 0452 08/06/13 0449  WBC 16.7* 16.1* 19.5*  HGB 8.2* 6.9* 8.3*  HCT 25.5* 21.9* 25.2*  PLT 279 206 188   Coag's  Recent Labs Lab 08/01/13 1930  INR 1.25   BMET  Recent Labs Lab 08/04/13 0341 08/05/13 0452 08/06/13 0449  NA 132* 132* 140  K 4.3 4.0 3.7  CL 100 99 105   CO2 18* 22 24  BUN 27* 35* 32*  CREATININE 0.91 0.67 0.54  GLUCOSE 220* 128* 105*   Electrolytes  Recent Labs Lab 08/01/13 0400  08/03/13 0400 08/04/13 0341 08/05/13 0452 08/06/13 0449  CALCIUM 8.1*  < > 8.0* 7.9* 8.6 9.4  MG 2.0  --  1.5 2.0  --   --   PHOS 2.3  --  2.5 3.5  --   --   < > = values in this interval not displayed. Sepsis Markers  Recent Labs Lab 08/03/13 0930 08/04/13 0341 08/05/13 0453  PROCALCITON 1.83 1.97 1.02   ABG  Recent Labs Lab 08/03/13 0422 08/04/13 0413 08/04/13 1130  PHART 7.303* 7.206* 7.235*  PCO2ART 42.3 49.8* 49.2*  PO2ART 62.9* 52.0* 130.0*   Liver Enzymes  Recent Labs Lab 08/01/13 0400 08/04/13 0341  AST 12 33  ALT 10 12  ALKPHOS 72 55  BILITOT 0.4 0.3  ALBUMIN 2.3* 1.5*   Cardiac Enzymes  Recent Labs Lab 08/03/13 0930  TROPONINI <0.30  PROBNP 3782.0*   Glucose  Recent Labs Lab 08/05/13 1224 08/05/13 1614 08/05/13 1938 08/06/13 0008 08/06/13 0418 08/06/13 0715  GLUCAP 108* 94 134* 110* 115* 107*    Imaging - noted   ASSESSMENT / PLAN:  PULMONARY A: Acute respiratory failure (post-op), req re-intubation 7/14 following extubation with hypoxemic failure - On vent ARDS, unclear etiology, question aspiration -   Ruled out Cardiogenic Etiology of Pulm Edema - 7/15 ECHO (nml LVEF) CAP, presumed P:   Full mech vent support, ARDS protocol for PEEP/FIO2 titration,  BDs PRN Pulmonary hygiene Repeat CXR in AM Hold Lasix in setting of ARDS at this time while remains on pressors,   CARDIOVASCULAR A:  Shock, consistent with Septic etiology, presumed CAP source - Weaning pressors Hemorrhagic Shock - Resolved, now with some decreasing Hgb (no overt bleeding) Tachycardia H/o CAD, HTN Elevated BNP, but Preserved LVEF - No evidence cardiogenic shock - 7/15 ECHO (nml EF) DVT Rt axillary vein- unable to start anticoagulation in setting of sign anemia   P:  Pressor support with goal MAP >65, off Vaso    Recommend Lasix once off pressors KVO IVF Hold plavix, lipitor, ASA Cardiology following  RENAL A:   Non-gap Metabolic Acidosis Hyponatremia Hypokalemia - Resolved P:   BMET in AM Replace electrolytes as needed  GASTROINTESTINAL A:   Suspected colitis with colonic obstruction/SBO s/p partial colectomy with new colostomy 7/13 Ileus, post-op Hx of Barrett's esophagus Nutrition >7/17 CT abd/pelvis >no abscess /no bleeding  P:   Post op care, nutrition per surgery, remains on TPN Surgery hopeful of start tube feeds if remains intubated over next few days Wound/ostomy care consult Continue  PPI    HEMATOLOGIC A:   Acute blood loss anemia, post-op - s/p (1u PRBC) 7/17 >hbg improved  RUE edema, >DVT Rt axillary vein>unable to begin anticoagulation d/t sig anemia  VTE ppx P:   SCD's CBC daily Hold enoxaparin, asa, plavix  Transfuse for Hb < 7 or bleeding  INFECTIOUS A:   Colitis, s/p partial colectomy Presumed CAP, question aspiration PNA - Stable low PCT, elevated WBC 16.7>19.5 P:   Antibiotics and micro as above F/u fever curve PCT Stable, no longer trend Check lactate in am   ENDOCRINE A: Septic Shock, requiring stress dose steroids   Hyperglycemia - improved No hx DM P:   Solucortef 50mg  IV q 6 hr, continue while on pressors, then wean off SSI Adjust insulin per pharm with TPN  NEUROLOGIC A:   Acute encephalopathy in post operative setting P:   RASS goal: -1, may need deeper sedation with IV versed for vent synchrony PRN fentanyl for sedation Restraints Add versed for agitation As needed    DERMATOLOGY A: Urticaria >> suspect may be related to diflucan vs opiates - Resolved P: Monitor PRN benadryl  GLOBAL: Improved prognosis today, remains intubated continue wean, only pressor left is Vaso (attempt trial off today), if hemodynamically stable off pressors, recommend starting Lasix in setting of ARDS. Concern with recurrent drop in Hgb, s/p 1u  PRBC, consider Abd CT today to eval for intra-abdominal bleeding post-op. Surgery following, remains on TPN, post-op ileus otherwise well functioning ostomy.     PARRETT,TAMMY NP-C  [facility]  [phone]   PCCM ATTENDING: I have interviewed and examined the patient and reviewed the database. I have formulated the assessment and plan as reflected in the note above with amendments made by me. 30 mins of direct critical care time provided  Merton Border, MD;  PCCM service; Mobile [phone]  08/06/2013, 10:06 AM

## 2013-08-07 ENCOUNTER — Inpatient Hospital Stay (HOSPITAL_COMMUNITY): Payer: Medicare HMO

## 2013-08-07 LAB — CBC
HCT: 24.4 % — ABNORMAL LOW (ref 36.0–46.0)
HEMOGLOBIN: 7.9 g/dL — AB (ref 12.0–15.0)
MCH: 28.8 pg (ref 26.0–34.0)
MCHC: 32.4 g/dL (ref 30.0–36.0)
MCV: 89.1 fL (ref 78.0–100.0)
PLATELETS: 197 10*3/uL (ref 150–400)
RBC: 2.74 MIL/uL — ABNORMAL LOW (ref 3.87–5.11)
RDW: 16.1 % — ABNORMAL HIGH (ref 11.5–15.5)
WBC: 18.6 10*3/uL — ABNORMAL HIGH (ref 4.0–10.5)

## 2013-08-07 LAB — GLUCOSE, CAPILLARY
GLUCOSE-CAPILLARY: 81 mg/dL (ref 70–99)
GLUCOSE-CAPILLARY: 74 mg/dL (ref 70–99)
Glucose-Capillary: 75 mg/dL (ref 70–99)
Glucose-Capillary: 81 mg/dL (ref 70–99)
Glucose-Capillary: 82 mg/dL (ref 70–99)
Glucose-Capillary: 85 mg/dL (ref 70–99)

## 2013-08-07 LAB — COMPREHENSIVE METABOLIC PANEL
ALBUMIN: 1.7 g/dL — AB (ref 3.5–5.2)
ALT: 21 U/L (ref 0–35)
AST: 36 U/L (ref 0–37)
Alkaline Phosphatase: 62 U/L (ref 39–117)
Anion gap: 11 (ref 5–15)
BUN: 31 mg/dL — ABNORMAL HIGH (ref 6–23)
CO2: 26 mEq/L (ref 19–32)
Calcium: 9.4 mg/dL (ref 8.4–10.5)
Chloride: 110 mEq/L (ref 96–112)
Creatinine, Ser: 0.49 mg/dL — ABNORMAL LOW (ref 0.50–1.10)
GFR calc Af Amer: >90 mL/min (ref >90)
GFR calc non Af Amer: >90 mL/min (ref >90)
Glucose, Bld: 84 mg/dL (ref 70–99)
Potassium: 3.7 mEq/L (ref 3.7–5.3)
SODIUM: 147 meq/L (ref 137–147)
TOTAL PROTEIN: 4.5 g/dL — AB (ref 6.0–8.3)
Total Bilirubin: 0.3 mg/dL (ref 0.3–1.2)

## 2013-08-07 LAB — LACTIC ACID, PLASMA: LACTIC ACID, VENOUS: 0.9 mmol/L (ref 0.5–2.2)

## 2013-08-07 MED ORDER — PIPERACILLIN-TAZOBACTAM 3.375 G IVPB
3.3750 g | Freq: Three times a day (TID) | INTRAVENOUS | Status: AC
  Administered 2013-08-07 – 2013-08-09 (×8): 3.375 g via INTRAVENOUS
  Filled 2013-08-07 (×8): qty 50

## 2013-08-07 MED ORDER — VITAL HIGH PROTEIN PO LIQD
1000.0000 mL | ORAL | Status: DC
  Administered 2013-08-07 – 2013-08-08 (×2): 1000 mL
  Filled 2013-08-07 (×4): qty 1000

## 2013-08-07 MED ORDER — TRACE MINERALS CR-CU-F-FE-I-MN-MO-SE-ZN IV SOLN
INTRAVENOUS | Status: AC
  Administered 2013-08-07: 18:00:00 via INTRAVENOUS
  Filled 2013-08-07: qty 2000

## 2013-08-07 MED ORDER — FAT EMULSION 20 % IV EMUL
250.0000 mL | INTRAVENOUS | Status: AC
Start: 2013-08-07 — End: 2013-08-08
  Administered 2013-08-07: 250 mL via INTRAVENOUS
  Filled 2013-08-07: qty 250

## 2013-08-07 NOTE — Progress Notes (Signed)
6 Days Post-Op  Subjective: No major events. intubated  Objective: Vital signs in last 24 hours: Temp:  [97.7 F (36.5 C)-98.3 F (36.8 C)] 98.3 F (36.8 C) (07/19 0417) Pulse Rate:  [73-110] 110 (07/19 0754) Resp:  [15-26] 19 (07/19 0754) BP: (91-126)/(42-82) 109/52 mmHg (07/19 0754) SpO2:  [99 %-100 %] 100 % (07/19 0754) Arterial Line BP: (98-121)/(45-83) 114/51 mmHg (07/19 0600) FiO2 (%):  [40 %] 40 % (07/19 0754) Last BM Date: 08/06/13  Intake/Output from previous day: 07/18 0701 - 07/19 0700 In: 3034.9 [I.V.:514.9; IV Piggyback:150; TPN:2290] Out: 2445 [Urine:2320; Emesis/NG output:25; Stool:100] Intake/Output this shift:    Intubated. Sedated. No follow commands Coarse b/l BS Soft, obese, open midline skin wound - tissue viable. Ostomy with stool   Lab Results:   Recent Labs  08/06/13 0449 08/07/13 0420  WBC 19.5* 18.6*  HGB 8.3* 7.9*  HCT 25.2* 24.4*  PLT 188 197   BMET  Recent Labs  08/06/13 0449 08/07/13 0420  NA 140 147  K 3.7 3.7  CL 105 110  CO2 24 26  GLUCOSE 105* 84  BUN 32* 31*  CREATININE 0.54 0.49*  CALCIUM 9.4 9.4   PT/INR No results found for this basename: LABPROT, INR,  in the last 72 hours ABG  Recent Labs  08/04/13 1130  PHART 7.235*  HCO3 20.6    Studies/Results: Ct Abdomen Pelvis W Contrast  08/05/2013   CLINICAL DATA:  Several days postop from colostomy for bowel leak. Decreased hemoglobin. Evaluate for postop hematoma or abscess  EXAM: CT ABDOMEN AND PELVIS WITH CONTRAST  TECHNIQUE: Multidetector CT imaging of the abdomen and pelvis was performed using the standard protocol following bolus administration of intravenous contrast.  CONTRAST:  9mL OMNIPAQUE IOHEXOL 300 MG/ML  SOLN  COMPARISON:  07/29/2013  FINDINGS: Images through the lung bases show increased small bilateral pleural effusions, bibasilar atelectasis, and probable bibasilar pulmonary edema. Patient has undergone transverse colostomy since prior study.  There  is no evidence of bowel obstruction. Diffuse mesenteric and body wall edema is seen. Small amount of free fluid seen along the left paracolic gutter and in the central pelvis, however no abscess identified. There is no evidence of retroperitoneal hemorrhage or focal hematoma.  Foley catheter is seen within the bladder. Prior hysterectomy noted. Adnexal regions are unremarkable in appearance. No focal inflammatory process or mass identified.  The liver, spleen, pancreas, adrenal glands, and kidneys are normal in appearance except for a tiny right renal cyst. No evidence of hydronephrosis.  IMPRESSION: Expected postop changes from transverse colostomy. No evidence of retroperitoneal hemorrhage.  Small amount of free fluid in left abdomen and pelvis, however no focal abscess identified.  Diffuse mesenteric and body wall edema.  Increased small bilateral pleural effusions, bibasilar atelectasis, and probable diffuse pulmonary edema.   Electronically Signed   By: Earle Gell M.D.   On: 08/05/2013 17:48   Dg Chest Port 1 View  08/07/2013   CLINICAL DATA:  Acute respiratory failure. On ventilator.  EXAM: PORTABLE CHEST - 1 VIEW  COMPARISON:  08/06/2013  FINDINGS: Support lines and tubes in appropriate position. Cardiomegaly stable. Diffuse interstitial edema pattern shows no significant change. Persistent airspace disease noted in the left retrocardiac lung base. No evidence of pneumothorax.  IMPRESSION: Stable cardiomegaly and diffuse interstitial edema pattern. Left retrocardiac airspace disease also stable, and superimposed pneumonia cannot be excluded.   Electronically Signed   By: Earle Gell M.D.   On: 08/07/2013 07:46   Dg Chest [facility]  1 View  08/06/2013   CLINICAL DATA:  Intubated, pneumonia  EXAM: PORTABLE CHEST - 1 VIEW  COMPARISON:  Prior chest x-ray 08/05/2013  FINDINGS: Endotracheal tube is 5.6 cm above the carina. The tip of the nasogastric tube overlies the gastric bubble. Right IJ approach central  venous catheter with the tip at the superior cavoatrial junction. Right upper extremity approach PICC with the catheter tip in the mid SVC. Stable cardiac and mediastinal contours. Atherosclerotic calcifications present in the transverse aorta.  Very similar appearance of the lungs although there is a slightly decreased interstitial prominence suggesting and improvement in interstitial pulmonary edema. Persistent bibasilar opacities. No pneumothorax or new acute abnormality. No acute osseous abnormality.  IMPRESSION: 1. Stable and satisfactory support apparatus. 2. Slight interval improvement in pulmonary interstitial edema. 3. Persistent bibasilar airspace opacities which remain nonspecific and may reflect infiltrate, atelectasis and/or small layering effusions.   Electronically Signed   By: Jacqulynn Cadet M.D.   On: 08/06/2013 07:36    Anti-infectives: Anti-infectives   Start     Dose/Rate Route Frequency Ordered Stop   08/03/13 1800  vancomycin (VANCOCIN) IVPB 750 mg/150 ml premix  Status:  Discontinued     750 mg 150 mL/hr over 60 Minutes Intravenous Every 12 hours 08/03/13 0440 08/04/13 0910   08/03/13 0500  vancomycin (VANCOCIN) 1,250 mg in sodium chloride 0.9 % 250 mL IVPB     1,250 mg 166.7 mL/hr over 90 Minutes Intravenous  Once 08/03/13 0440 08/03/13 0646   08/03/13 0500  piperacillin-tazobactam (ZOSYN) IVPB 3.375 g     3.375 g 12.5 mL/hr over 240 Minutes Intravenous 3 times per day 08/03/13 0440     08/01/13 1245  metroNIDAZOLE (FLAGYL) IVPB 500 mg     500 mg 100 mL/hr over 60 Minutes Intravenous To Surgery 08/01/13 1238 08/01/13 1300   07/31/13 0800  cefoTEtan (CEFOTAN) 2 g in dextrose 5 % 50 mL IVPB     2 g 100 mL/hr over 30 Minutes Intravenous On call to O.R. 07/31/13 0741 08/01/13 0559   07/31/13 0800  cefTRIAXone (ROCEPHIN) 2 g in dextrose 5 % 50 mL IVPB  Status:  Discontinued    Comments:  Pharmacy may adjust dosing strength / duration / interval for maximal efficacy   2  g 100 mL/hr over 30 Minutes Intravenous Every 24 hours 07/31/13 0744 08/03/13 0405   07/29/13 2100  ciprofloxacin (CIPRO) IVPB 400 mg  Status:  Discontinued     400 mg 200 mL/hr over 60 Minutes Intravenous Every 12 hours 07/29/13 2058 07/31/13 0744   07/29/13 2100  metroNIDAZOLE (FLAGYL) IVPB 500 mg  Status:  Discontinued     500 mg 100 mL/hr over 60 Minutes Intravenous Every 8 hours 07/29/13 2058 08/03/13 0405   07/29/13 2100  fluconazole (DIFLUCAN) IVPB 200 mg  Status:  Discontinued     200 mg 100 mL/hr over 60 Minutes Intravenous Every 24 hours 07/29/13 2058 08/04/13 0911   07/29/13 1930  ciprofloxacin (CIPRO) IVPB 400 mg     400 mg 200 mL/hr over 60 Minutes Intravenous  Once 07/29/13 1915 07/29/13 2031   07/29/13 1930  metroNIDAZOLE (FLAGYL) IVPB 500 mg  Status:  Discontinued     500 mg 100 mL/hr over 60 Minutes Intravenous  Once 07/29/13 1915 07/29/13 2107      Assessment/Plan: s/p Procedure(s): EXPLORATORY LAPAROTOMY (N/A) PARTIAL COLECTOMY (N/A) COLOSTOMY (N/A) SMALL BOWEL RESECTION (N/A) Repair SB enterotomies  2. Postoperative ileus  3. PCM, TNA  4. Ventilator dependent respiratory  failure  5. ARDS 6. Anemia  Wean Neo as tolerated Wean vent as tolerated Cont TPN Can begin trickle tube feeds (do not advance) Follow wbc. No fever. Wbc down slightly. Cont zosyn for total of 7 days for empiric gut coverage given contamination during surgery Appreciate CCM support  Leighton Ruff. Redmond Pulling, MD, FACS General, Bariatric, & Minimally Invasive Surgery [facility], [place]    LOS: 9 days    Gayland Curry 08/07/2013

## 2013-08-07 NOTE — Progress Notes (Signed)
[place] Progress Note Patient Name: [name] DOB: July 30, 1940 MRN: [id number]  Date of Service  08/07/2013   HPI/Events of Note   Art-Line Out, ? From RN if need to replace. RN comfortable with correlation between a-line and cuff. Currently on pressure support.   eICU Interventions   Trial without A-line. Replace if needed.    Intervention Category Minor Interventions: Routine modifications to care plan (e.g. PRN medications for pain, fever)  Naiya Corral R. 08/07/2013, 3:47 PM

## 2013-08-07 NOTE — Progress Notes (Signed)
PULMONARY / CRITICAL CARE MEDICINE   Name: [name] MRN: [id number] DOB: 27-May-1940    ADMISSION DATE:  07/29/2013 CONSULTATION DATE:  08/01/2013  REFERRING MD :  Surgery PRIMARY SERVICE: PCCM  CHIEF COMPLAINT:  Bowel obstruction  BRIEF PATIENT DESCRIPTION: 73 y/o female with PMH CAD, HTN, Barrett's esophagus, presented to [facility] ED 7/10 with LLQ pain. Found to have bowel obstruction. To OR for repair 7/13. Remains of vent post-op. Consulted PCCM.  SIGNIFICANT EVENTS / STUDIES: 7/10 Admit, CT Abd - dilated prox colon w/ abrupt transition c/w with obstruction, diverticulitis vs colitis, Surgery c/s 7/12 Empiric Cipro/Flagyl, Cardiology consulted for pre-op evaluation, TPN per pharm 7/13 - Laparotomy, partial colectomy, colostomy, SB resection, repair of enterotomy, lysis of adhesions         - Hgb down to 6.8 >> imp to 10.8 s/p 2u PRBC 7/14 - Extubation, CXR stable, Hgb 9.5, o/n tachycardic >> Cards start Esmolol, IVF         - o/n required re-intubation due to hypoxemic resp failure, hypotension, concern for aspiration         - DC Esmolol, start Vaso, switch to Vanc / Zosyn 7/15 - CXR (worsening bilateral airspace disease, suspect edema), concern for volume overload despite CVP 5-6         - Add Vaso, Trop (neg), BNP 3782, PCT 1.83, ECHO (normal LVEF) 7/16 - Considered ARDS (unclear etiology, ?aspiration), CXR (persistent b/l edema), cont pressors, stable low PCT 1.9 7/17 - o/n Hgb 8.2 >> 6.9, received 1u PRBC, titrating down Neo, CVP 15-16         - Off Neo, remains on Vaso, CXR (mild improved b/l pulm edema) 7/17 - CT abd >no bleeding , no abscess, sm fluid in abd/pelvis  7/17 - Ven Dopp>+DVT Rt axillary vein 7/18 - off Vaso 7/19 - on intermittent Neo, CXR mild improved b/l diffuse interstitial edema, trial SBT anticipate extubate 1-2 days         - start trickle tube feeds (per surgery)   LINES / TUBES: ETT 7/13 >> 7/14, 7/14 >>  PICC 7/12 >>  R IJ CVL 7/14 >>  L  radial arterial cath 7/15 >>   CULTURES: Blood x 2, 7/15 >> NEG Respiratory 7/15 >> few Candida  ANTIBIOTICS: Ceftriaxone 7/10 >>> 7/14 Flagyl 7/10 >>> 7/14 Vancomycin 7/14 >>> 7/16 Fluconazole 7/10 >>> 7/16 Zosyn 7/14 >> (stop date 7/21, total 7 day course)  SUBJECTIVE:  No overnight events. Remains on intermittent Neo, remains sedated on Fentanyl on vent. Per RT, attempted SBT initially tolerating but inc RR to 40s and resumed vent support. Today remains sedated with poor alertness, weaning fentanyl.  VITAL SIGNS: Temp:  [97.7 F (36.5 C)-98.3 F (36.8 C)] 98.3 F (36.8 C) (07/19 0417) Pulse Rate:  [73-110] 110 (07/19 0754) Resp:  [15-26] 19 (07/19 0754) BP: (91-126)/(42-65) 109/52 mmHg (07/19 0754) SpO2:  [99 %-100 %] 100 % (07/19 0754) Arterial Line BP: (98-121)/(45-83) 114/51 mmHg (07/19 0600) FiO2 (%):  [40 %] 40 % (07/19 0754) HEMODYNAMICS: CVP:  [5 mmHg-24 mmHg] 8 mmHg VENTILATOR SETTINGS: Vent Mode:  [-] CPAP;PSV FiO2 (%):  [40 %] 40 % Set Rate:  [16 bmp-25 bmp] 16 bmp Vt Set:  [480 mL] 480 mL PEEP:  [5 cmH20] 5 cmH20 Pressure Support:  [5 cmH20] 5 cmH20 Plateau Pressure:  [21 cmH20-22 cmH20] 21 cmH20 INTAKE / OUTPUT: Intake/Output     07/18 0701 - 07/19 0700 07/19 0701 - 07/20 0700   I.V. (mL/kg) 514.9 (  6.6)    Other 80    IV Piggyback 150    TPN 2290    Total Intake(mL/kg) 3034.9 (39.1)    Urine (mL/kg/hr) 2320 (1.2)    Emesis/NG output 25 (0)    Stool 100 (0.1)    Total Output 2445     Net +589.9          Stool Occurrence 1 x      PHYSICAL EXAMINATION: General:  On vent Neuro:  Sedated RASS -1, intermittently following commands Cardiovascular:  Tachycardic, regular rhythm, no murmurs Lungs: Improved bilateral coarse breath sounds, with unchanged bibasilar crackles Abdomen:  Soft, abd dressing in place. LLQ healthy appearing colostomy (liquid brown stool), +hypoactive BS Ext: Bilateral lower ext cool, trace distal pulses, mostly unchanged  generalized +2 pitting edema upper/lower ext  LABS:  CBC  Recent Labs Lab 08/05/13 0452 08/06/13 0449 08/07/13 0420  WBC 16.1* 19.5* 18.6*  HGB 6.9* 8.3* 7.9*  HCT 21.9* 25.2* 24.4*  PLT 206 188 197   Coag's  Recent Labs Lab 08/01/13 1930  INR 1.25   BMET  Recent Labs Lab 08/05/13 0452 08/06/13 0449 08/07/13 0420  NA 132* 140 147  K 4.0 3.7 3.7  CL 99 105 110  CO2 22 24 26   BUN 35* 32* 31*  CREATININE 0.67 0.54 0.49*  GLUCOSE 128* 105* 84   Electrolytes  Recent Labs Lab 08/01/13 0400  08/03/13 0400 08/04/13 0341 08/05/13 0452 08/06/13 0449 08/07/13 0420  CALCIUM 8.1*  < > 8.0* 7.9* 8.6 9.4 9.4  MG 2.0  --  1.5 2.0  --   --   --   PHOS 2.3  --  2.5 3.5  --   --   --   < > = values in this interval not displayed. Sepsis Markers  Recent Labs Lab 08/03/13 0930 08/04/13 0341 08/05/13 0453 08/07/13 0420  LATICACIDVEN  --   --   --  0.9  PROCALCITON 1.83 1.97 1.02  --    ABG  Recent Labs Lab 08/03/13 0422 08/04/13 0413 08/04/13 1130  PHART 7.303* 7.206* 7.235*  PCO2ART 42.3 49.8* 49.2*  PO2ART 62.9* 52.0* 130.0*   Liver Enzymes  Recent Labs Lab 08/01/13 0400 08/04/13 0341 08/07/13 0420  AST 12 33 36  ALT 10 12 21   ALKPHOS 72 55 62  BILITOT 0.4 0.3 0.3  ALBUMIN 2.3* 1.5* 1.7*   Cardiac Enzymes  Recent Labs Lab 08/03/13 0930  TROPONINI <0.30  PROBNP 3782.0*   Glucose  Recent Labs Lab 08/06/13 1118 08/06/13 1533 08/06/13 1929 08/06/13 2351 08/07/13 0349 08/07/13 0707  GLUCAP 109* 97 94 85 81 81    Imaging - noted   ASSESSMENT / PLAN:  PULMONARY A: Acute respiratory failure (post-op), req re-intubation 7/14 following extubation with hypoxemic failure - On vent ARDS, unclear etiology, question aspiration Ruled out Cardiogenic Etiology of Pulm Edema - 7/15 ECHO (nml LVEF) CAP, presumed P:   Full mech vent support, ARDS protocol for PEEP/FIO2 titration Daily SBT, initially failed wean (with tachypnea). Continue  SBT wean today, anticipate extubate 1-2 days BDs PRN Pulmonary hygiene Repeat CXR in AM Hold Lasix in setting of ARDS at this time while remains on pressors,   CARDIOVASCULAR A:  Shock, consistent with Septic etiology, presumed CAP source - Improving, weaning pressors Hemorrhagic Shock - Resolved. No evidence of bleeding on CT Abd Tachycardia H/o CAD, HTN Elevated BNP, but Preserved LVEF - No evidence cardiogenic shock - 7/15 ECHO (nml EF) DVT Rt axillary vein P:  Pressor support with goal MAP >65, continue Neo (wean), off Vaso (7/18) Recommend Lasix once off pressors Lactate stable, normal, 0.9 KVO IVF Hold plavix, lipitor, ASA Cardiology following  RENAL A:   Non-gap Metabolic Acidosis - Resolved Hyponatremia Hypokalemia - Resolved P:   BMET in AM Replace electrolytes as needed  GASTROINTESTINAL A:   Suspected colitis with colonic obstruction/SBO s/p partial colectomy with new colostomy 7/13 - Stable CT Abd Ileus, post-op Hx of Barrett's esophagus Nutrition P:   Post op care, nutrition per surgery, with plans to resume trickle tube feeds 10 cc/hr (x 24 hr) Wound/ostomy care consult Continue PPI   HEMATOLOGIC A:   Acute blood loss anemia, post-op - Hgb stable, 7.9 DVT Rt axillary vein - Unable to start anticoagulation d/t concern post-op bleeding, currently stable VTE ppx P:  SCD's CBC daily Hold enoxaparin, asa, plavix  Transfuse for Hb < 7 or bleeding  INFECTIOUS A:   Colitis, s/p partial colectomy Presumed CAP, question aspiration PNA - Dec WBC to 18.6 P:   Antibiotics and micro as above F/u fever curve  ENDOCRINE A: Septic Shock, requiring stress dose steroids   Hyperglycemia - improved No hx DM P:   Solucortef 50mg  IV q 6 hr, continue while on pressors, then wean off SSI May need adjust insulin if transition to trickle tube feeds  NEUROLOGIC A:   Acute encephalopathy in post operative setting Sedated on Mechanical Vent P:   RASS goal:  -1, may need deeper sedation with IV versed for vent synchrony Titrate down Fentanyl gtt to minimize sedation, continue PRN Restraints Add versed for agitation As needed    DERMATOLOGY A: Urticaria >> suspect may be related to diflucan vs opiates - Resolved P: Monitor PRN benadryl  GLOBAL: Remains on intermittent pressors with Neo (off Vaso 7/18), cont vent support for ARDS initially failed SBT today continue to wean, anticipate extubate 1-2 days, monitor Hgb (stabilized) no overt evidence of persistent bleeding (CT Abd negative). Surgery advance to trickle tube feeds today. Overall gradual improvement.   Nobie Putnam, DO [place], PGY-2   PCCM ATTENDING: I have interviewed and examined the patient and reviewed the database. I have formulated the assessment and plan as reflected in the note above with amendments made by me. 35 mins of direct critical care time provided  She tolerates PS 10-15 cm H2O. Moderately uncomfortable on WUA. Family updated @ bedside  Merton Border, MD;  PCCM service; Mobile [phone]  08/07/2013, 9:18 AM

## 2013-08-07 NOTE — Progress Notes (Signed)
PARENTERAL NUTRITION CONSULT NOTE - Follow-up  Pharmacy Consult for TPN Indication: Bowel Obstruction  Allergies  Allergen Reactions  . Pregabalin Swelling    Tongue swelling  . Sulfonamide Derivatives Swelling    Childhood reaction   Patient Measurements: Height: 5' (152.4 cm) Weight: 171 lb 4.8 oz (77.7 kg) IBW/kg (Calculated) : 45.5  Vital Signs: Temp: 98.3 F (36.8 C) (07/19 0417) Temp src: Oral (07/19 0417) BP: 109/52 mmHg (07/19 0754) Pulse Rate: 110 (07/19 0754) Intake/Output from previous day: 07/18 0701 - 07/19 0700 In: 3034.9 [I.V.:514.9; IV Piggyback:150; TPN:2290] Out: 2445 [Urine:2320; Emesis/NG output:25; Stool:100] Intake/Output from this shift:  Labs:  Recent Labs  08/05/13 0452 08/06/13 0449 08/07/13 0420  WBC 16.1* 19.5* 18.6*  HGB 6.9* 8.3* 7.9*  HCT 21.9* 25.2* 24.4*  PLT 206 188 197    Recent Labs  08/05/13 0452 08/06/13 0449 08/07/13 0420  NA 132* 140 147  K 4.0 3.7 3.7  CL 99 105 110  CO2 22 24 26   GLUCOSE 128* 105* 84  BUN 35* 32* 31*  CREATININE 0.67 0.54 0.49*  CALCIUM 8.6 9.4 9.4  PROT  --   --  4.5*  ALBUMIN  --   --  1.7*  AST  --   --  36  ALT  --   --  21  ALKPHOS  --   --  62  BILITOT  --   --  0.3   Estimated Creatinine Clearance: 56.9 ml/min (by C-G formula based on Cr of 0.49).   CBG (last 3)   Recent Labs  08/06/13 2351 08/07/13 0349 08/07/13 0707  GLUCAP 85 81 81    Insulin Requirements in the past 24 hours:  0 units Novolog SSI; 100 units regular insulin in TPN (pt receives about 72 units)  Current Nutrition:  Clinimix E 5/15 at 80 ml/hr and 20% lipids at 10 ml/hr provides 1843 kcal and 96g protein  Nutritional Goals:  1750-1900 kcal, 80-95 gm protein (per RD note 7/13)  Assessment: Admit: 73 yo female admitted with c/o abdominal pain associated with N&V.  She has been admitted to [facility] multiple times over the past few months with a similar complaint.  Of note, past surgical history  reveals an appendectomy, cholecystectomy, colon resection and an abdominal hysterectomy.  GI: Pt with prolonged obstruction and expect prolonged post-op ileus. S/p exp lap/LOA/partial colectomy/end-colostomy, small bowel resection, repair of multiple enterotomies 7/13. NPO with TPN. Baseline prealbumin 9 (low).  Endo: No h/o DM. CBGs well-controlled with increased insulin in TPN. Pt continues on resistant SSI with insulin in TPN. Steroids added 7/15 so probably exacerbating hyperglycemia.  When Solu-cortef stopped, will need to cut back on TPN insulin.  Lytes: Na 147, K 3.7   Renal: SCr stable.  UOP 2.4L yesterday  Pulm: Remains intubated. Fio2 40%.  Cards: CAD, HTN. Remains on pheynyephrine   Hepatobil: LFTs wnl. TG 96.  Neuro: Sedated with fent gtt. GCS 12. h/o depression. Sertraline PTA  ID: On Zosyn for sepsis. Wbc up to 19.7. PCT decreasing to 1.02.  Best Practices: Lovenox, PPI IV (home med)  Home meds: Will need resumed once pt able to take po  TPN Access: Double lumen PICC placed 7/12  TPN day#: 7  Plan:  1.  Continue Clinimix E 5/15 to goal of 54ml/hr and continue 20% lipids at 10 ml/hr. This will provide 1843 kcal and 96g protein 2.  Continue resistant SSI coverage  3.  TPN labs in AM  Heide Guile, PharmD,  BCPS Clinical Pharmacist Pager [phone]   08/07/2013,8:06 AM

## 2013-08-08 ENCOUNTER — Inpatient Hospital Stay (HOSPITAL_COMMUNITY): Payer: Medicare HMO

## 2013-08-08 DIAGNOSIS — M129 Arthropathy, unspecified: Secondary | ICD-10-CM

## 2013-08-08 DIAGNOSIS — K2270 Barrett's esophagus without dysplasia: Secondary | ICD-10-CM

## 2013-08-08 LAB — COMPREHENSIVE METABOLIC PANEL
ALT: 30 U/L (ref 0–35)
AST: 43 U/L — ABNORMAL HIGH (ref 0–37)
Albumin: 2 g/dL — ABNORMAL LOW (ref 3.5–5.2)
Alkaline Phosphatase: 72 U/L (ref 39–117)
Anion gap: 9 (ref 5–15)
BUN: 34 mg/dL — ABNORMAL HIGH (ref 6–23)
CALCIUM: 9.1 mg/dL (ref 8.4–10.5)
CO2: 28 mEq/L (ref 19–32)
CREATININE: 0.47 mg/dL — AB (ref 0.50–1.10)
Chloride: 111 mEq/L (ref 96–112)
GFR calc non Af Amer: >90 mL/min (ref >90)
Glucose, Bld: 80 mg/dL (ref 70–99)
Potassium: 4.2 mEq/L (ref 3.7–5.3)
SODIUM: 148 meq/L — AB (ref 137–147)
Total Bilirubin: 0.4 mg/dL (ref 0.3–1.2)
Total Protein: 5 g/dL — ABNORMAL LOW (ref 6.0–8.3)

## 2013-08-08 LAB — POCT I-STAT 3, ART BLOOD GAS (G3+)
Acid-Base Excess: 7 mmol/L — ABNORMAL HIGH (ref 0.0–2.0)
Bicarbonate: 29.3 mEq/L — ABNORMAL HIGH (ref 20.0–24.0)
O2 SAT: 98 %
Patient temperature: 98.6
TCO2: 30 mmol/L (ref 0–100)
pCO2 arterial: 31.6 mmHg — ABNORMAL LOW (ref 35.0–45.0)
pH, Arterial: 7.575 — ABNORMAL HIGH (ref 7.350–7.450)
pO2, Arterial: 88 mmHg (ref 80.0–100.0)

## 2013-08-08 LAB — CBC
HEMATOCRIT: 25.7 % — AB (ref 36.0–46.0)
Hemoglobin: 8.2 g/dL — ABNORMAL LOW (ref 12.0–15.0)
MCH: 28.6 pg (ref 26.0–34.0)
MCHC: 31.9 g/dL (ref 30.0–36.0)
MCV: 89.5 fL (ref 78.0–100.0)
PLATELETS: 186 10*3/uL (ref 150–400)
RBC: 2.87 MIL/uL — ABNORMAL LOW (ref 3.87–5.11)
RDW: 16.1 % — AB (ref 11.5–15.5)
WBC: 17.9 10*3/uL — ABNORMAL HIGH (ref 4.0–10.5)

## 2013-08-08 LAB — GLUCOSE, CAPILLARY
GLUCOSE-CAPILLARY: 126 mg/dL — AB (ref 70–99)
GLUCOSE-CAPILLARY: 127 mg/dL — AB (ref 70–99)
Glucose-Capillary: 107 mg/dL — ABNORMAL HIGH (ref 70–99)
Glucose-Capillary: 115 mg/dL — ABNORMAL HIGH (ref 70–99)
Glucose-Capillary: 143 mg/dL — ABNORMAL HIGH (ref 70–99)
Glucose-Capillary: 69 mg/dL — ABNORMAL LOW (ref 70–99)
Glucose-Capillary: 82 mg/dL (ref 70–99)
Glucose-Capillary: 85 mg/dL (ref 70–99)
Glucose-Capillary: 94 mg/dL (ref 70–99)

## 2013-08-08 LAB — BASIC METABOLIC PANEL
ANION GAP: 12 (ref 5–15)
BUN: 34 mg/dL — ABNORMAL HIGH (ref 6–23)
CALCIUM: 8.6 mg/dL (ref 8.4–10.5)
CO2: 31 mEq/L (ref 19–32)
Chloride: 106 mEq/L (ref 96–112)
Creatinine, Ser: 0.55 mg/dL (ref 0.50–1.10)
GFR calc Af Amer: >90 mL/min (ref >90)
Glucose, Bld: 129 mg/dL — ABNORMAL HIGH (ref 70–99)
Potassium: 2.8 mEq/L — CL (ref 3.7–5.3)
Sodium: 149 mEq/L — ABNORMAL HIGH (ref 137–147)

## 2013-08-08 LAB — DIFFERENTIAL
BASOS ABS: 0 10*3/uL (ref 0.0–0.1)
BASOS PCT: 0 % (ref 0–1)
EOS ABS: 0 10*3/uL (ref 0.0–0.7)
Eosinophils Relative: 0 % (ref 0–5)
LYMPHS ABS: 1.8 10*3/uL (ref 0.7–4.0)
Lymphocytes Relative: 10 % — ABNORMAL LOW (ref 12–46)
Monocytes Absolute: 0.9 10*3/uL (ref 0.1–1.0)
Monocytes Relative: 5 % (ref 3–12)
NEUTROS PCT: 85 % — AB (ref 43–77)
Neutro Abs: 15.1 10*3/uL — ABNORMAL HIGH (ref 1.7–7.7)

## 2013-08-08 LAB — MAGNESIUM
MAGNESIUM: 2 mg/dL (ref 1.5–2.5)
MAGNESIUM: 1.7 mg/dL (ref 1.5–2.5)

## 2013-08-08 LAB — PHOSPHORUS: Phosphorus: 3.8 mg/dL (ref 2.3–4.6)

## 2013-08-08 LAB — TROPONIN I: Troponin I: <0.3 ng/mL (ref <0.30)

## 2013-08-08 LAB — TRIGLYCERIDES: TRIGLYCERIDES: 108 mg/dL (ref <150)

## 2013-08-08 LAB — HEPARIN LEVEL (UNFRACTIONATED): Heparin Unfractionated: 0.37 IU/mL (ref 0.30–0.70)

## 2013-08-08 LAB — PREALBUMIN: PREALBUMIN: 14.1 mg/dL — AB (ref 17.0–34.0)

## 2013-08-08 MED ORDER — TRACE MINERALS CR-CU-F-FE-I-MN-MO-SE-ZN IV SOLN
INTRAVENOUS | Status: DC
  Administered 2013-08-08: 17:00:00 via INTRAVENOUS
  Filled 2013-08-08: qty 2000

## 2013-08-08 MED ORDER — FUROSEMIDE 10 MG/ML IJ SOLN
60.0000 mg | Freq: Once | INTRAMUSCULAR | Status: AC
  Administered 2013-08-08: 60 mg via INTRAVENOUS
  Filled 2013-08-08: qty 6

## 2013-08-08 MED ORDER — DEXTROSE 50 % IV SOLN
25.0000 mL | Freq: Once | INTRAVENOUS | Status: AC | PRN
  Administered 2013-08-08: 25 mL via INTRAVENOUS
  Filled 2013-08-08: qty 50

## 2013-08-08 MED ORDER — FAT EMULSION 20 % IV EMUL
250.0000 mL | INTRAVENOUS | Status: DC
  Administered 2013-08-08: 250 mL via INTRAVENOUS
  Filled 2013-08-08: qty 250

## 2013-08-08 MED ORDER — DEXTROSE 10 % IV SOLN
INTRAVENOUS | Status: AC
  Administered 2013-08-08: 10:00:00 via INTRAVENOUS

## 2013-08-08 MED ORDER — HYDROMORPHONE HCL PF 1 MG/ML IJ SOLN: 1.0000 mg | INTRAMUSCULAR | Status: DC | PRN

## 2013-08-08 MED ORDER — HEPARIN (PORCINE) IN NACL 100-0.45 UNIT/ML-% IJ SOLN
900.0000 [IU]/h | INTRAMUSCULAR | Status: DC
  Administered 2013-08-08: 1100 [IU]/h via INTRAVENOUS
  Administered 2013-08-10 – 2013-08-11 (×2): 900 [IU]/h via INTRAVENOUS
  Filled 2013-08-08 (×7): qty 250

## 2013-08-08 MED ORDER — DEXMEDETOMIDINE HCL IN NACL 200 MCG/50ML IV SOLN
0.0000 ug/kg/h | INTRAVENOUS | Status: AC
  Administered 2013-08-08: 0.2 ug/kg/h via INTRAVENOUS
  Administered 2013-08-08 (×2): 0.4 ug/kg/h via INTRAVENOUS
  Administered 2013-08-09: 0.8 ug/kg/h via INTRAVENOUS
  Administered 2013-08-09: 0.498 ug/kg/h via INTRAVENOUS
  Administered 2013-08-09: 0.4 ug/kg/h via INTRAVENOUS
  Administered 2013-08-09: 0.6 ug/kg/h via INTRAVENOUS
  Administered 2013-08-10 (×2): 0.3 ug/kg/h via INTRAVENOUS
  Administered 2013-08-11 (×2): 0.9 ug/kg/h via INTRAVENOUS
  Administered 2013-08-11: 0.6 ug/kg/h via INTRAVENOUS
  Filled 2013-08-08 (×14): qty 50

## 2013-08-08 MED ORDER — POTASSIUM CHLORIDE 20 MEQ/15ML (10%) PO LIQD
40.0000 meq | Freq: Three times a day (TID) | ORAL | Status: AC
  Administered 2013-08-08 – 2013-08-09 (×2): 40 meq
  Filled 2013-08-08 (×3): qty 30

## 2013-08-08 MED ORDER — DEXTROSE 5 % IV SOLN
INTRAVENOUS | Status: DC
  Administered 2013-08-08 – 2013-08-10 (×2): via INTRAVENOUS

## 2013-08-08 MED ORDER — HYDROCORTISONE NA SUCCINATE PF 100 MG IJ SOLR
25.0000 mg | Freq: Four times a day (QID) | INTRAMUSCULAR | Status: DC
  Administered 2013-08-08 – 2013-08-09 (×3): 25 mg via INTRAVENOUS
  Filled 2013-08-08 (×8): qty 0.5

## 2013-08-08 MED ORDER — MORPHINE SULFATE 2 MG/ML IJ SOLN
1.0000 mg | INTRAMUSCULAR | Status: DC | PRN
  Administered 2013-08-10 – 2013-08-11 (×4): 2 mg via INTRAVENOUS
  Filled 2013-08-08 (×4): qty 1

## 2013-08-08 MED ORDER — DEXTROSE 5 % IV SOLN: INTRAVENOUS | Status: DC

## 2013-08-08 NOTE — Progress Notes (Signed)
ANTICOAGULATION CONSULT NOTE - Initial Consult + ANTIBIOTIC CONSULT NOTE - [place] for Heparin  Indication: R-DVT  Allergies  Allergen Reactions  . Pregabalin Swelling    Tongue swelling  . Sulfonamide Derivatives Swelling    Childhood reaction    Patient Measurements: Height: 5' (152.4 cm) Weight: 175 lb 4.3 oz (79.5 kg) IBW/kg (Calculated) : 45.5 Heparin Dosing Weight: 64 kg  Vital Signs: Temp: 100 F (37.8 C) (07/20 1638) Temp src: Oral (07/20 1900) BP: 137/61 mmHg (07/20 2000) Pulse Rate: 109 (07/20 2000)  Labs:  Recent Labs  08/06/13 0449 08/07/13 0420 08/08/13 0500 08/08/13 1754 08/08/13 2040  HGB 8.3* 7.9* 8.2*  --   --   HCT 25.2* 24.4* 25.7*  --   --   PLT 188 197 186  --   --   HEPARINUNFRC  --   --   --   --  0.37  CREATININE 0.54 0.49* 0.47* 0.55  --   TROPONINI  --   --   --  <0.30  --     Estimated Creatinine Clearance: 57.6 ml/min (by C-G formula based on Cr of 0.55).   Medical History: Past Medical History  Diagnosis Date  . Carotid artery disease     a. 60-70% bilat ICA stenosis by dopplers 05/2012.  Marland Kitchen Other and unspecified hyperlipidemia   . CAD (coronary artery disease)     a. Mod dz 2010 initially mgd medically. b. 12/2012 - angina s/p PTCA/DES to mid-circumflex, PTCA/DES to first OM.   Marland Kitchen Hypertension   . Heart murmur   . Depression   . Barrett's esophagus   . Arthritis     Assessment: 41 YOF who presented to [facility] on 7/10 with LLQ pain and was found to have a bowel obstruction, s/p repair on 7/13. The patient was found to have a right axillary vein DVT on 7/17 via dopplers however treatment was held initially due to the recent surgery. It was determined that the patient's risks of getting a new clot exceeded the patient's risk for bleeding and pharmacy was consulted to start full dose heparin to treat. The patient's PICC line is being removed. Heparin level came back therapeutic tonight.   Goal of  Therapy:  Heparin level 0.3-0.7 units/ml Monitor platelets by anticoagulation protocol: Yes Proper antibiotics for infection/cultures adjusted for renal/hepatic function    Plan:   Cont heparin at 1100 units/hr (11 ml/hr) F/u with HL in AM

## 2013-08-08 NOTE — Progress Notes (Signed)
[place] Progress Note Patient Name: [name] DOB: 10/31/1940 MRN: [id number]  Date of Service  08/08/2013   HPI/Events of Note   Short runs of vtach  eICU Interventions  See orders    Intervention Category Major Interventions: Arrhythmia - evaluation and management  Asencion Noble 08/08/2013, 5:53 PM

## 2013-08-08 NOTE — Progress Notes (Signed)
ANTICOAGULATION CONSULT NOTE - Initial Consult + ANTIBIOTIC CONSULT NOTE - [place] for Heparin + Zosyn Indication: R-DVT & empiric coverage for r/o HCAP/sepsis  Allergies  Allergen Reactions  . Pregabalin Swelling    Tongue swelling  . Sulfonamide Derivatives Swelling    Childhood reaction    Patient Measurements: Height: 5' (152.4 cm) Weight: 175 lb 4.3 oz (79.5 kg) IBW/kg (Calculated) : 45.5 Heparin Dosing Weight: 64 kg  Vital Signs: Temp: 98.6 F (37 C) (07/20 0829) Temp src: Oral (07/20 0829) BP: 167/85 mmHg (07/20 0811) Pulse Rate: 122 (07/20 0811)  Labs:  Recent Labs  08/06/13 0449 08/07/13 0420 08/08/13 0500  HGB 8.3* 7.9* 8.2*  HCT 25.2* 24.4* 25.7*  PLT 188 197 186  CREATININE 0.54 0.49* 0.47*    Estimated Creatinine Clearance: 57.6 ml/min (by C-G formula based on Cr of 0.47).   Medical History: Past Medical History  Diagnosis Date  . Carotid artery disease     a. 60-70% bilat ICA stenosis by dopplers 05/2012.  Marland Kitchen Other and unspecified hyperlipidemia   . CAD (coronary artery disease)     a. Mod dz 2010 initially mgd medically. b. 12/2012 - angina s/p PTCA/DES to mid-circumflex, PTCA/DES to first OM.   Marland Kitchen Hypertension   . Heart murmur   . Depression   . Barrett's esophagus   . Arthritis     Assessment: 25 YOF who presented to the [facility] on 7/10 with LLQ pain and was found to have a bowel obstruction, s/p repair on 7/13. The patient was found to have a right axillary vein DVT on 7/17 via dopplers however treatment was held initially due to the recent surgery. It was determined that the patient's risks of getting a new clot exceeded the patient's risk for bleeding and pharmacy was consulted to start full dose heparin to treat. The patient's PICC line is being removed today. Per CCM - no bolus on the heparin drip.  The patient also continues on Zosyn for empiric coverage of r/o HCAP/sepsis. Renal function remains stable - dose is  appropriate. Stop date is already entered for 7/22.   Goal of Therapy:  Heparin level 0.3-0.7 units/ml Monitor platelets by anticoagulation protocol: Yes Proper antibiotics for infection/cultures adjusted for renal/hepatic function    Plan:  1. Initiate heparin at 1100 units/hr (11 ml/hr) 2. No bolus due to recent concern for bleed 3. Daily heparin levels, CBC 4. Continue Zosyn 3.375g IV every 8 hours (infused over 4 hours) 5. Will continue to monitor for any signs/symptoms of bleeding and will follow up with heparin level in 8 hours  6. Will continue to follow renal function, culture results, LOT, and antibiotic de-escalation plans   Alycia Rossetti, PharmD, BCPS Clinical Pharmacist Pager: [phone] 08/08/2013 9:46 AM

## 2013-08-08 NOTE — Progress Notes (Signed)
eLink Physician-Brief Progress Note Patient Name: MAELEE HOOT DOB: 1940-03-22 MRN: [id number]  Date of Service  08/08/2013   HPI/Events of Note   hypokalemia  eICU Interventions  K supp   Intervention Category Major Interventions: Electrolyte abnormality - evaluation and management  Asencion Noble 08/08/2013, 7:34 PM

## 2013-08-08 NOTE — Progress Notes (Addendum)
NUTRITION FOLLOW UP  Intervention:    Goal nutrition intake from TPN at this time is 1195 kcals and 70 gm protein per day; wean TPN per Pharmacy as TF is advanced and tolerated.   Continue trickle TF for now, when able to advance TF rate, increase Vital High Protein to goal rate of 55 ml/h (1320 ml/day) to provide 1320 kcals (92% of estimated needs), 116 gm protein, 1104 ml free water daily.   Nutrition Dx:   Inadequate oral intake related to inability to eat as evidenced by NPO status. Ongoing.  Goal:   Intake to meet >90% of estimated nutrition needs. Met.  Monitor:   Weight trend, lab trends, TF tolerance and adequacy, TPN provision.  Assessment:   PMHx significant for diverticulitis, CAD, HTN, depression. Admitted with abdominal pain and c/n x 2 weeks. Hx of lysis of adhesions and ovarian cystectomy for SBO in April 2015. Work-up reveals colonic obstruction proximal to descending colon, MD suspects etiology of ischemia vs malignancy.  S/P laparotomy, partial colectomy, colostomy, SB resection, repair of enterotomy, lysis of adhesions on 7/13. Was able to extubate on 7/14, but required re-intubation the same day. Has been receiving TPN since 7/13.  Patient is receiving TPN with Clinimix E 5/15 at 80 ml/hr and lipids @ 10 ml/hr. Provides 2160 ml, 1843 kcal, and 96 grams protein per day. Meets 100% of estimated energy needs and 100% of minimum estimated protein needs.  TF was initiated on 7/19 with Vital High Protein at 10 ml/h providing 240 kcals, 21 gm protein, 201 ml free water daily.  Patient remains intubated on ventilator support MV: 12.5 L/min Temp (24hrs), Avg:98.3 F (36.8 C), Min:97.7 F (36.5 C), Max:98.6 F (37 C)   Height: Ht Readings from Last 1 Encounters:  08/02/13 5' (1.524 m)    Ideal weight: 45.5 kg   Weight Status:   Wt Readings from Last 1 Encounters:  08/08/13 175 lb 4.3 oz (79.5 kg)  07/29/13  154 lb 3.2 oz (69.945 kg)  BMI=30.1  Re-estimated  needs:  Kcal: 1435 Protein: >/= 91 gm Fluid: 2 L  Skin: abdominal incision  Diet Order:  NPO   Intake/Output Summary (Last 24 hours) at 08/08/13 1020 Last data filed at 08/08/13 0800  Gross per 24 hour  Intake 2687.24 ml  Output   1255 ml  Net 1432.24 ml    Last BM: 7/4   Labs:   Recent Labs Lab 08/03/13 0400 08/04/13 0341  08/06/13 0449 08/07/13 0420 08/08/13 0500  NA 137 132*  < > 140 147 148*  K 3.6* 4.3  < > 3.7 3.7 4.2  CL 103 100  < > 105 110 111  CO2 20 18*  < > _0 BUN 19 27*  < > 32* 31* 34*  CREATININE 0.91 0.91  < > 0.54 0.49* 0.47*  CALCIUM 8.0* 7.9*  < > 9.4 9.4 9.1  MG 1.5 2.0  --   --   --  2.0  PHOS 2.5 3.5  --   --   --  3.8  GLUCOSE 121* 220*  < > 105* 84 80  < > = values in this interval not displayed.  CBG (last 3)   Recent Labs  08/08/13 0103 08/08/13 0352 08/08/13 0746  GLUCAP 107* 85 82    Scheduled Meds: . antiseptic oral rinse  15 mL Mouth Rinse QID  . chlorhexidine  15 mL Mouth Rinse BID  . feeding supplement (VITAL HIGH PROTEIN)  1,000  mL Per Tube Q24H  . hydrocortisone sod succinate (SOLU-CORTEF) inj  25 mg Intravenous Q6H  . insulin aspart  0-20 Units Subcutaneous 6 times per day  . lip balm  1 application Topical BID  . pantoprazole (PROTONIX) IV  40 mg Intravenous Q24H  . piperacillin-tazobactam (ZOSYN)  IV  3.375 g Intravenous 3 times per day    Continuous Infusions: . dexmedetomidine 0.2 mcg/kg/hr (08/08/13 1006)  . dextrose 80 mL/hr at 08/08/13 1006  . Marland KitchenTPN (CLINIMIX-E) Adult 80 mL/hr at 08/07/13 1753   And  . fat emulsion 250 mL (08/07/13 1754)  . Marland KitchenTPN (CLINIMIX-E) Adult     And  . fat emulsion    . fentaNYL infusion INTRAVENOUS 150 mcg/hr (08/08/13 1006)  . heparin    . phenylephrine (NEO-SYNEPHRINE) Adult infusion Stopped (08/07/13 2000)    Molli Barrows, RD, LDN, [place] Pager [phone] After Hours Pager [phone]

## 2013-08-08 NOTE — Progress Notes (Signed)
PARENTERAL NUTRITION CONSULT NOTE - Follow-up  Pharmacy Consult for TPN Indication: Bowel Obstruction  Allergies  Allergen Reactions  . Pregabalin Swelling    Tongue swelling  . Sulfonamide Derivatives Swelling    Childhood reaction   Patient Measurements: Height: 5' (152.4 cm) Weight: 175 lb 4.3 oz (79.5 kg) IBW/kg (Calculated) : 45.5  Vital Signs: Temp: 98.4 F (36.9 C) (07/20 0422) Temp src: Oral (07/20 0422) BP: 145/71 mmHg (07/20 0500) Pulse Rate: 89 (07/20 0500) Intake/Output from previous day: 07/19 0701 - 07/20 0700 In: 2627.5 [I.V.:317; NG/GT:230.5; IV Piggyback:100; GEX:5284] Out: 1324 [Urine:1205] Intake/Output from this shift:  Labs:  Recent Labs  08/06/13 0449 08/07/13 0420 08/08/13 0500  WBC 19.5* 18.6* 17.9*  HGB 8.3* 7.9* 8.2*  HCT 25.2* 24.4* 25.7*  PLT 188 197 186    Recent Labs  08/06/13 0449 08/07/13 0420 08/08/13 0500  NA 140 147 148*  K 3.7 3.7 4.2  CL 105 110 111  CO2 24 26 28   GLUCOSE 105* 84 80  BUN 32* 31* 34*  CREATININE 0.54 0.49* 0.47*  CALCIUM 9.4 9.4 9.1  MG  --   --  2.0  PHOS  --   --  3.8  PROT  --  4.5* 5.0*  ALBUMIN  --  1.7* 2.0*  AST  --  36 43*  ALT  --  21 30  ALKPHOS  --  62 72  BILITOT  --  0.3 0.4  TRIG  --   --  108   Estimated Creatinine Clearance: 57.6 ml/min (by C-G formula based on Cr of 0.47).   CBG (last 3)   Recent Labs  08/07/13 2348 08/08/13 0103 08/08/13 0352  GLUCAP 69* 107* 85    Insulin Requirements in the past 24 hours:  0 units Novolog SSI; 100 units regular insulin in TPN (pt receives about 72 units)  Current Nutrition:  Clinimix E 5/15 at 80 ml/hr and 20% lipids at 10 ml/hr provides 1843 kcal and 96g protein  Nutritional Goals:  1750-1900 kcal, 80-95 gm protein (per RD note 7/13)  Assessment: Admit: 73 yo female admitted with c/o abdominal pain associated with N&V.  She has been admitted to [facility] multiple times over the past few months with a similar  complaint.  Of note, past surgical history reveals an appendectomy, cholecystectomy, colon resection and an abdominal hysterectomy.  GI: Pt with prolonged obstruction and expect prolonged post-op ileus. S/p exp lap/LOA/partial colectomy/end-colostomy, small bowel resection, repair of multiple enterotomies 7/13. Trickle feeds started with plan to not advance. Baseline prealbumin 9 (low).  Endo: No h/o DM. CBGs 69-107. Pt continues on resistant SSI with insulin in TPN with no sliding scale insulin administered.  Plan to continue steroids while on pressors  Lytes: Na 148, K 4.2, Phos 3.8, Mag 2.0   Renal: SCr stable.  UOP 0.6 ml/kg/hr  Pulm: Remains intubated. Fio2 40%.  Cards: CAD, HTN. Weaning phenylephrine off  Hepatobil: LFTs wnl. TG 108  Neuro: Sedated with fent gtt. GCS 12. h/o depression. Sertraline PTA  ID: Zosyn  Wbc 17.9. PCT decreasing to 1.02.  Best Practices: Lovenox, PPI IV (home med)  Home meds: Will need resumed once pt able to take po  TPN Access: Double lumen PICC placed 7/12  TPN day#: 8  Plan:  1.  Continue Clinimix E 5/15 to goal of 78ml/hr and continue 20% lipids at 10 ml/hr. This will provide 1843 kcal and 96g protein 2.  Continue resistant SSI coverage and decrease amount of  insulin in TPN 3.  F/u advancement of TFs  Excell Seltzer, PharmD Clinical Pharmacist [phone]   08/08/2013,7:26 AM

## 2013-08-08 NOTE — Progress Notes (Signed)
UR Completed.  Vergie Living [phone] 08/08/2013

## 2013-08-08 NOTE — Progress Notes (Signed)
7 Days Post-Op  Subjective: Pt on SBT Trickle TFs started  Objective: Vital signs in last 24 hours: Temp:  [97.7 F (36.5 C)-98.4 F (36.9 C)] 98.4 F (36.9 C) (07/20 0422) Pulse Rate:  [83-120] 98 (07/20 0700) Resp:  [13-31] 23 (07/20 0700) BP: (86-147)/(40-91) 147/59 mmHg (07/20 0700) SpO2:  [96 %-100 %] 100 % (07/20 0700) Arterial Line BP: (86-129)/(37-53) 126/53 mmHg (07/19 1600) FiO2 (%):  [40 %] 40 % (07/20 0348) Weight:  [175 lb 4.3 oz (79.5 kg)] 175 lb 4.3 oz (79.5 kg) (07/20 0500) Last BM Date: 08/06/13  Intake/Output from previous day: 07/19 0701 - 07/20 0700 In: 2867.5 [I.V.:357; NG/GT:250.5; IV Piggyback:100; TPN:2160] Out: 6599 [Urine:1205] Intake/Output this shift:    General appearance: alert Cardio: regular rate and rhythm, S1, S2 normal, no murmur, click, rub or gallop GI: soft, wound c/d/i, ostomy patent  Lab Results:   Recent Labs  08/07/13 0420 08/08/13 0500  WBC 18.6* 17.9*  HGB 7.9* 8.2*  HCT 24.4* 25.7*  PLT 197 186   BMET  Recent Labs  08/07/13 0420 08/08/13 0500  NA 147 148*  K 3.7 4.2  CL 110 111  CO2 26 28  GLUCOSE 84 80  BUN 31* 34*  CREATININE 0.49* 0.47*  CALCIUM 9.4 9.1   PT/INR No results found for this basename: LABPROT, INR,  in the last 72 hours ABG No results found for this basename: PHART, PCO2, PO2, HCO3,  in the last 72 hours  Studies/Results: Dg Chest Port 1 View  08/08/2013   CLINICAL DATA:  Respiratory failure.  Intubation  EXAM: PORTABLE CHEST - 1 VIEW  COMPARISON:  08/07/2013  FINDINGS: Endotracheal tube in good position. NG tube in the stomach. Right jugular catheter tip at the cavoatrial junction. Right arm PICC tip in the SVC. No pneumothorax.  Interval increase in bilateral airspace disease suggestive of edema. Bibasilar atelectasis unchanged. No significant effusion.  IMPRESSION: Support lines remain in good position and unchanged.  Slight increase and diffuse airspace disease suggestive of pulmonary  edema. Bibasilar atelectasis unchanged.   Electronically Signed   By: Franchot Gallo M.D.   On: 08/08/2013 07:37   Dg Chest Port 1 View  08/07/2013   CLINICAL DATA:  Acute respiratory failure. On ventilator.  EXAM: PORTABLE CHEST - 1 VIEW  COMPARISON:  08/06/2013  FINDINGS: Support lines and tubes in appropriate position. Cardiomegaly stable. Diffuse interstitial edema pattern shows no significant change. Persistent airspace disease noted in the left retrocardiac lung base. No evidence of pneumothorax.  IMPRESSION: Stable cardiomegaly and diffuse interstitial edema pattern. Left retrocardiac airspace disease also stable, and superimposed pneumonia cannot be excluded.   Electronically Signed   By: Earle Gell M.D.   On: 08/07/2013 07:46    Anti-infectives: Anti-infectives   Start     Dose/Rate Route Frequency Ordered Stop   08/07/13 1400  piperacillin-tazobactam (ZOSYN) IVPB 3.375 g     3.375 g 12.5 mL/hr over 240 Minutes Intravenous 3 times per day 08/07/13 0921 08/10/13 1359   08/03/13 1800  vancomycin (VANCOCIN) IVPB 750 mg/150 ml premix  Status:  Discontinued     750 mg 150 mL/hr over 60 Minutes Intravenous Every 12 hours 08/03/13 0440 08/04/13 0910   08/03/13 0500  vancomycin (VANCOCIN) 1,250 mg in sodium chloride 0.9 % 250 mL IVPB     1,250 mg 166.7 mL/hr over 90 Minutes Intravenous  Once 08/03/13 0440 08/03/13 0646   08/03/13 0500  piperacillin-tazobactam (ZOSYN) IVPB 3.375 g  Status:  Discontinued  3.375 g 12.5 mL/hr over 240 Minutes Intravenous 3 times per day 08/03/13 0440 08/07/13 0921   08/01/13 1245  metroNIDAZOLE (FLAGYL) IVPB 500 mg     500 mg 100 mL/hr over 60 Minutes Intravenous To Surgery 08/01/13 1238 08/01/13 1300   07/31/13 0800  cefoTEtan (CEFOTAN) 2 g in dextrose 5 % 50 mL IVPB     2 g 100 mL/hr over 30 Minutes Intravenous On call to O.R. 07/31/13 0741 08/01/13 0559   07/31/13 0800  cefTRIAXone (ROCEPHIN) 2 g in dextrose 5 % 50 mL IVPB  Status:  Discontinued     Comments:  Pharmacy may adjust dosing strength / duration / interval for maximal efficacy   2 g 100 mL/hr over 30 Minutes Intravenous Every 24 hours 07/31/13 0744 08/03/13 0405   07/29/13 2100  ciprofloxacin (CIPRO) IVPB 400 mg  Status:  Discontinued     400 mg 200 mL/hr over 60 Minutes Intravenous Every 12 hours 07/29/13 2058 07/31/13 0744   07/29/13 2100  metroNIDAZOLE (FLAGYL) IVPB 500 mg  Status:  Discontinued     500 mg 100 mL/hr over 60 Minutes Intravenous Every 8 hours 07/29/13 2058 08/03/13 0405   07/29/13 2100  fluconazole (DIFLUCAN) IVPB 200 mg  Status:  Discontinued     200 mg 100 mL/hr over 60 Minutes Intravenous Every 24 hours 07/29/13 2058 08/04/13 0911   07/29/13 1930  ciprofloxacin (CIPRO) IVPB 400 mg     400 mg 200 mL/hr over 60 Minutes Intravenous  Once 07/29/13 1915 07/29/13 2031   07/29/13 1930  metroNIDAZOLE (FLAGYL) IVPB 500 mg  Status:  Discontinued     500 mg 100 mL/hr over 60 Minutes Intravenous  Once 07/29/13 1915 07/29/13 2107      Assessment/Plan: s/p Procedure(s): EXPLORATORY LAPAROTOMY (N/A) PARTIAL COLECTOMY (N/A) COLOSTOMY (N/A) SMALL BOWEL RESECTION (N/A) Con't trickle TF Con't abx Pt with some MS change this AM as per RN.  Sedation decreased to see if that helps with her function. Cont' weaning vent as per CCM   LOS: 10 days    Rosario Jacks., Anne Hahn 08/08/2013

## 2013-08-08 NOTE — Progress Notes (Signed)
PULMONARY / CRITICAL CARE MEDICINE   Name: [name] MRN: [id number] DOB: April 20, 1940    ADMISSION DATE:  07/29/2013 CONSULTATION DATE:  08/01/2013  REFERRING MD :  Surgery PRIMARY SERVICE: PCCM  CHIEF COMPLAINT:  Bowel obstruction  BRIEF PATIENT DESCRIPTION: 73 y/o female with PMH CAD, HTN, Barrett's esophagus, presented to [facility] ED 7/10 with LLQ pain. Found to have bowel obstruction. To OR for repair 7/13. Remains of vent post-op. Consulted PCCM.  SIGNIFICANT EVENTS / STUDIES: 7/10 Admit, CT Abd - dilated prox colon w/ abrupt transition c/w with obstruction, diverticulitis vs colitis, Surgery c/s 7/12 Empiric Cipro/Flagyl, Cardiology consulted for pre-op evaluation, TPN per pharm 7/13 - Laparotomy, partial colectomy, colostomy, SB resection, repair of enterotomy, lysis of adhesions         - Hgb down to 6.8 >> imp to 10.8 s/p 2u PRBC 7/14 - Extubation, CXR stable, Hgb 9.5, o/n tachycardic >> Cards start Esmolol, IVF         - o/n required re-intubation due to hypoxemic resp failure, hypotension, concern for aspiration         - DC Esmolol, start Vaso, switch to Vanc / Zosyn 7/15 - CXR (worsening bilateral airspace disease, suspect edema), concern for volume overload despite CVP 5-6         - Add Vaso, Trop (neg), BNP 3782, PCT 1.83, ECHO (normal LVEF) 7/16 - Considered ARDS (unclear etiology, ?aspiration), CXR (persistent b/l edema), cont pressors, stable low PCT 1.9 7/17 - o/n Hgb 8.2 >> 6.9, received 1u PRBC, titrating down Neo, CVP 15-16         - Off Neo, remains on Vaso, CXR (mild improved b/l pulm edema) 7/17 - CT abd >no bleeding , no abscess, sm fluid in abd/pelvis  7/17 - Ven Dopp>+DVT Rt axillary vein 7/18 - off Vaso 7/19 - on intermittent Neo, CXR mild improved b/l diffuse interstitial edema, trial SBT anticipate extubate 1-2 days         - start trickle tube feeds (per surgery) 7/20 - off Neo, cont SBT, CXR (mild improved / stable diffuse interstitial edema, Hgb  improved 7.9 >> 8.2   LINES / TUBES: ETT 7/13 >> 7/14, 7/14 >> PICC 7/12 >>7/20 R IJ CVL 7/14 >>  L radial arterial cath 7/15 >>   CULTURES: Blood x 2, 7/15 >> negative Respiratory 7/15 >> few Candida  ANTIBIOTICS: Ceftriaxone 7/10 >>> 7/14 Flagyl 7/10 >>> 7/14 Vancomycin 7/14 >>> 7/16 Fluconazole 7/10 >>> 7/16 Zosyn 7/14 >> (stop date 7/21, total 7 day course)  SUBJECTIVE:  Per RN, pt came off Neo last night, off pressors, remains on Fentanyl drip, remains on trickle tube feeds and TPN. Continue SBT today, suctioning with thick white secretions. Overall with persistent poor mental status, minimal alertness, not following commands.  VITAL SIGNS: Temp:  [97.7 F (36.5 C)-98.4 F (36.9 C)] 98.4 F (36.9 C) (07/20 0422) Pulse Rate:  [83-120] 89 (07/20 0500) Resp:  [13-31] 23 (07/20 0500) BP: (86-147)/(40-91) 145/71 mmHg (07/20 0500) SpO2:  [96 %-100 %] 100 % (07/20 0500) Arterial Line BP: (86-129)/(37-53) 126/53 mmHg (07/19 1600) FiO2 (%):  [40 %] 40 % (07/20 0348) Weight:  [175 lb 4.3 oz (79.5 kg)] 175 lb 4.3 oz (79.5 kg) (07/20 0500) HEMODYNAMICS: CVP:  [6 mmHg-12 mmHg] 9 mmHg VENTILATOR SETTINGS: Vent Mode:  [-] PRVC FiO2 (%):  [40 %] 40 % Set Rate:  [16 bmp] 16 bmp Vt Set:  [480 mL] 480 mL PEEP:  [5 cmH20] 5 cmH20 Pressure Support:  [  5 cmH20-15 cmH20] 15 cmH20 INTAKE / OUTPUT: Intake/Output     07/19 0701 - 07/20 0700   I.V. (mL/kg) 317 (4)   NG/GT 230.5   IV Piggyback 100   TPN 1980   Total Intake(mL/kg) 2627.5 (33.1)   Urine (mL/kg/hr) 1205 (0.6)   Total Output 1205   Net +1422.5         PHYSICAL EXAMINATION: General:  On vent Neuro:  Sedated RASS -1 to -3, not following commands Cardiovascular:  Tachycardic, regular rhythm, no murmurs Lungs: Diffuse bilateral rhonchi with coarse breath sounds Abdomen:  Soft, abd dressing in place. LLQ colostomy appears to be functioning (liquid brown stool), +hypoactive BS Ext: Bilateral lower ext mostly unchanged  generalized +2 pitting edema upper/lower ext  LABS:  CBC  Recent Labs Lab 08/06/13 0449 08/07/13 0420 08/08/13 0500  WBC 19.5* 18.6* 17.9*  HGB 8.3* 7.9* 8.2*  HCT 25.2* 24.4* 25.7*  PLT 188 197 186   Coag's  Recent Labs Lab 08/01/13 1930  INR 1.25   BMET  Recent Labs Lab 08/06/13 0449 08/07/13 0420 08/08/13 0500  NA 140 147 148*  K 3.7 3.7 4.2  CL 105 110 111  CO2 24 26 28   BUN 32* 31* 34*  CREATININE 0.54 0.49* 0.47*  GLUCOSE 105* 84 80   Electrolytes  Recent Labs Lab 08/03/13 0400 08/04/13 0341  08/06/13 0449 08/07/13 0420 08/08/13 0500  CALCIUM 8.0* 7.9*  < > 9.4 9.4 9.1  MG 1.5 2.0  --   --   --  2.0  PHOS 2.5 3.5  --   --   --  3.8  < > = values in this interval not displayed. Sepsis Markers  Recent Labs Lab 08/03/13 0930 08/04/13 0341 08/05/13 0453 08/07/13 0420  LATICACIDVEN  --   --   --  0.9  PROCALCITON 1.83 1.97 1.02  --    ABG  Recent Labs Lab 08/03/13 0422 08/04/13 0413 08/04/13 1130  PHART 7.303* 7.206* 7.235*  PCO2ART 42.3 49.8* 49.2*  PO2ART 62.9* 52.0* 130.0*   Liver Enzymes  Recent Labs Lab 08/04/13 0341 08/07/13 0420 08/08/13 0500  AST 33 36 43*  ALT 12 21 30   ALKPHOS 55 62 72  BILITOT 0.3 0.3 0.4  ALBUMIN 1.5* 1.7* 2.0*   Cardiac Enzymes  Recent Labs Lab 08/03/13 0930  TROPONINI <0.30  PROBNP 3782.0*   Glucose  Recent Labs Lab 08/07/13 1112 08/07/13 1528 08/07/13 1921 08/07/13 2348 08/08/13 0103 08/08/13 0352  GLUCAP 82 74 75 69* 107* 85    Imaging - noted   ASSESSMENT / PLAN:  PULMONARY A: Acute respiratory failure (post-op), req re-intubation 7/14 following extubation with hypoxemic failure - On vent ARDS, unclear etiology, question aspiration Ruled out Cardiogenic Etiology of Pulm Edema - 7/15 ECHO (nml LVEF) CAP, presumed P:   Full mech vent support, ARDS protocol for PEEP/FIO2 titration Daily SBT, cpap 5 ps 5 failed, increase PS 10-15 to reduce rate Consider early  trach>? Goal neg balance now Repeat CXR in AM for volume status abg on rest  CARDIOVASCULAR A:  Shock, consistent with Septic etiology, presumed CAP source - Improving, weaning pressors Hemorrhagic Shock - Resolved. No evidence of bleeding on CT Abd Tachycardia H/o CAD, HTN Elevated BNP, but Preserved LVEF - No evidence cardiogenic shock - 7/15 ECHO (nml EF) DVT Rt axillary vein P:  Currently off Pressor support with goal MAP >65, weaned off Neo, off Vaso (7/18) Start Lasix IV for diuresis with goal CVP 4-8, as tolerated  Lactate normalized KVO IVF Hold plavix, lipitor, ASA Cardiology following Dc picc  RENAL A:   Non-gap Metabolic Acidosis - Resolved Hypernatremia - 148 Hypokalemia - Resolved P:   BMET in AM Monitor Na add d5w lasix started  GASTROINTESTINAL A:   Suspected colitis with colonic obstruction/SBO s/p partial colectomy with new colostomy 7/13 Ileus, post-op Hx of Barrett's esophagus Nutrition P:   Post op care Nutrition per surgery - continue trickle tube feeds 10 cc/hr (x 24 hr), would want to increase this rate Want to avoid TPN, will d/w surgery to dc in am even if at 20 cc/hr Wound/ostomy care consult Continue PPI   HEMATOLOGIC A:   Acute blood loss anemia, post-op - Hgb stable/improved, 8.2 - no bleeding noted DVT Rt axillary vein VTE ppx P:  SCD's CBC daily Transfuse for Hb < 7 or bleeding Start heparin as risk PE / DVt higher than risk bleeding currently  INFECTIOUS A:   Colitis, s/p partial colectomy Presumed CAP, question aspiration PNA - Dec WBC to 18.6 P:   Antibiotics zosyn to stop in am   ENDOCRINE A: Septic Shock, requiring stress dose steroids   Hyperglycemia - improved No hx DM P:   Solucortef to 25 mg as off pressors SSI May need adjust insulin if transition once initiate tube feeds  NEUROLOGIC A:   Acute encephalopathy - Continued poor mental status Sedated on Mechanical Vent P:   RASS goal: -, dc versed, NOT  a home med Titrate down Fentanyl gtt to minimize sedation, continue PRN PT unable precedex consideration  DERMATOLOGY A: Urticaria >> suspect may be related to diflucan vs opiates - Resolved P: Monitor PRN benadryl  GLOBAL: Off pressors today, cont vent support for ARDS with continue SBT, want to escalate TF and dc tpn soon, lasix   Nobie Putnam, Kenneth, PGY-2  08/08/2013, 6:50 AM  Ccm time 30 min  I have fully examined this patient and agree with above findings.    And edited infull  Lavon Paganini. Titus Mould, MD, [place] Pgr: [facility]

## 2013-08-09 ENCOUNTER — Inpatient Hospital Stay (HOSPITAL_COMMUNITY): Payer: Medicare HMO

## 2013-08-09 LAB — CBC
HEMATOCRIT: 27.2 % — AB (ref 36.0–46.0)
HEMATOCRIT: 21.6 % — AB (ref 36.0–46.0)
HEMOGLOBIN: 8.7 g/dL — AB (ref 12.0–15.0)
HEMOGLOBIN: 7 g/dL — AB (ref 12.0–15.0)
MCH: 29.3 pg (ref 26.0–34.0)
MCH: 28.8 pg (ref 26.0–34.0)
MCHC: 32 g/dL (ref 30.0–36.0)
MCHC: 31.9 g/dL (ref 30.0–36.0)
MCV: 91.6 fL (ref 78.0–100.0)
MCV: 90 fL (ref 78.0–100.0)
PLATELETS: 200 10*3/uL (ref 150–400)
Platelets: 210 10*3/uL (ref 150–400)
RBC: 2.97 MIL/uL — ABNORMAL LOW (ref 3.87–5.11)
RBC: 2.4 MIL/uL — ABNORMAL LOW (ref 3.87–5.11)
RDW: 16.6 % — AB (ref 11.5–15.5)
RDW: 17 % — ABNORMAL HIGH (ref 11.5–15.5)
WBC: 13.8 10*3/uL — AB (ref 4.0–10.5)
WBC: 13.2 10*3/uL — AB (ref 4.0–10.5)

## 2013-08-09 LAB — CULTURE, BLOOD (ROUTINE X 2)
Culture: NO GROWTH
Culture: NO GROWTH

## 2013-08-09 LAB — BLOOD GAS, ARTERIAL
Acid-Base Excess: 8.5 mmol/L — ABNORMAL HIGH (ref 0.0–2.0)
Bicarbonate: 31.4 mEq/L — ABNORMAL HIGH (ref 20.0–24.0)
DRAWN BY: 39899
FIO2: 0.5 %
LHR: 12 {breaths}/min
MECHVT: 450 mL
O2 Saturation: 99.2 %
PATIENT TEMPERATURE: 100.3
PEEP: 5 cmH2O
TCO2: 32.5 mmol/L (ref 0–100)
pCO2 arterial: 36.5 mmHg (ref 35.0–45.0)
pH, Arterial: 7.548 — ABNORMAL HIGH (ref 7.350–7.450)
pO2, Arterial: 127 mmHg — ABNORMAL HIGH (ref 80.0–100.0)

## 2013-08-09 LAB — LACTIC ACID, PLASMA: Lactic Acid, Venous: 2 mmol/L (ref 0.5–2.2)

## 2013-08-09 LAB — BASIC METABOLIC PANEL
ANION GAP: 7 (ref 5–15)
ANION GAP: 8 (ref 5–15)
Anion gap: 11 (ref 5–15)
BUN: 33 mg/dL — ABNORMAL HIGH (ref 6–23)
BUN: 29 mg/dL — AB (ref 6–23)
BUN: 27 mg/dL — AB (ref 6–23)
CALCIUM: 8.5 mg/dL (ref 8.4–10.5)
CALCIUM: 7.3 mg/dL — AB (ref 8.4–10.5)
CHLORIDE: 107 meq/L (ref 96–112)
CHLORIDE: 107 meq/L (ref 96–112)
CHLORIDE: 107 meq/L (ref 96–112)
CO2: 32 meq/L (ref 19–32)
CO2: 33 mEq/L — ABNORMAL HIGH (ref 19–32)
CO2: 31 meq/L (ref 19–32)
CREATININE: 0.49 mg/dL — AB (ref 0.50–1.10)
Calcium: 7.6 mg/dL — ABNORMAL LOW (ref 8.4–10.5)
Creatinine, Ser: 0.51 mg/dL (ref 0.50–1.10)
Creatinine, Ser: 0.54 mg/dL (ref 0.50–1.10)
GFR calc Af Amer: >90 mL/min (ref >90)
GFR calc Af Amer: >90 mL/min (ref >90)
GFR calc Af Amer: >90 mL/min (ref >90)
GFR calc non Af Amer: >90 mL/min (ref >90)
GFR calc non Af Amer: >90 mL/min (ref >90)
GFR calc non Af Amer: >90 mL/min (ref >90)
GLUCOSE: 86 mg/dL (ref 70–99)
Glucose, Bld: 93 mg/dL (ref 70–99)
Glucose, Bld: 173 mg/dL — ABNORMAL HIGH (ref 70–99)
Potassium: 2.9 mEq/L — CL (ref 3.7–5.3)
Potassium: 3.2 mEq/L — ABNORMAL LOW (ref 3.7–5.3)
Potassium: 2.8 mEq/L — CL (ref 3.7–5.3)
SODIUM: 150 meq/L — AB (ref 137–147)
Sodium: 147 mEq/L (ref 137–147)
Sodium: 146 mEq/L (ref 137–147)

## 2013-08-09 LAB — GLUCOSE, CAPILLARY
GLUCOSE-CAPILLARY: 83 mg/dL (ref 70–99)
Glucose-Capillary: 134 mg/dL — ABNORMAL HIGH (ref 70–99)
Glucose-Capillary: 157 mg/dL — ABNORMAL HIGH (ref 70–99)
Glucose-Capillary: 77 mg/dL (ref 70–99)
Glucose-Capillary: 81 mg/dL (ref 70–99)
Glucose-Capillary: 83 mg/dL (ref 70–99)

## 2013-08-09 LAB — HEPARIN LEVEL (UNFRACTIONATED): Heparin Unfractionated: 0.63 IU/mL (ref 0.30–0.70)

## 2013-08-09 MED ORDER — POTASSIUM CHLORIDE 10 MEQ/50ML IV SOLN
10.0000 meq | Freq: Once | INTRAVENOUS | Status: AC
Start: 2013-08-09 — End: 2013-08-09
  Administered 2013-08-09: 10 meq via INTRAVENOUS

## 2013-08-09 MED ORDER — HYDROCORTISONE NA SUCCINATE PF 100 MG IJ SOLR
25.0000 mg | Freq: Three times a day (TID) | INTRAMUSCULAR | Status: DC
  Filled 2013-08-09 (×3): qty 0.5

## 2013-08-09 MED ORDER — TRACE MINERALS CR-CU-F-FE-I-MN-MO-SE-ZN IV SOLN
INTRAVENOUS | Status: DC
  Filled 2013-08-09: qty 2000

## 2013-08-09 MED ORDER — METOPROLOL TARTRATE 1 MG/ML IV SOLN
INTRAVENOUS | Status: AC
  Filled 2013-08-09: qty 5

## 2013-08-09 MED ORDER — PANTOPRAZOLE SODIUM 40 MG PO PACK
40.0000 mg | PACK | Freq: Every day | ORAL | Status: DC
  Administered 2013-08-09 – 2013-08-11 (×3): 40 mg
  Filled 2013-08-09 (×4): qty 20

## 2013-08-09 MED ORDER — RISPERIDONE 1 MG/ML PO SOLN
0.5000 mg | Freq: Two times a day (BID) | ORAL | Status: DC
  Administered 2013-08-09 – 2013-08-11 (×5): 0.5 mg
  Filled 2013-08-09 (×6): qty 0.5

## 2013-08-09 MED ORDER — SODIUM CHLORIDE 0.9 % IV BOLUS (SEPSIS)
500.0000 mL | Freq: Once | INTRAVENOUS | Status: AC
  Administered 2013-08-09: 500 mL via INTRAVENOUS

## 2013-08-09 MED ORDER — FAT EMULSION 20 % IV EMUL
250.0000 mL | INTRAVENOUS | Status: DC
  Filled 2013-08-09: qty 250

## 2013-08-09 MED ORDER — METOPROLOL TARTRATE 12.5 MG HALF TABLET
12.5000 mg | ORAL_TABLET | Freq: Two times a day (BID) | ORAL | Status: DC
  Administered 2013-08-09: 12.5 mg
  Filled 2013-08-09 (×4): qty 1

## 2013-08-09 MED ORDER — POTASSIUM CHLORIDE 10 MEQ/50ML IV SOLN
10.0000 meq | INTRAVENOUS | Status: AC
  Administered 2013-08-09: 10 meq via INTRAVENOUS
  Filled 2013-08-09 (×2): qty 50

## 2013-08-09 MED ORDER — NOREPINEPHRINE BITARTRATE 1 MG/ML IV SOLN
2.0000 ug/min | INTRAVENOUS | Status: DC
  Administered 2013-08-09: 20 ug/min via INTRAVENOUS
  Administered 2013-08-10: 5 ug/min via INTRAVENOUS
  Administered 2013-08-10: 4 ug/min via INTRAVENOUS
  Administered 2013-08-11: 2 ug/min via INTRAVENOUS
  Administered 2013-08-12: 8 ug/min via INTRAVENOUS
  Administered 2013-08-12 (×2): 12 ug/min via INTRAVENOUS
  Administered 2013-08-13: 2 ug/min via INTRAVENOUS
  Administered 2013-08-15: 4 ug/min via INTRAVENOUS
  Filled 2013-08-09 (×10): qty 4

## 2013-08-09 MED ORDER — POTASSIUM CHLORIDE 20 MEQ/15ML (10%) PO LIQD
40.0000 meq | ORAL | Status: AC
  Administered 2013-08-10 (×2): 40 meq via ORAL
  Filled 2013-08-09: qty 30

## 2013-08-09 MED ORDER — POTASSIUM CHLORIDE 10 MEQ/50ML IV SOLN
INTRAVENOUS | Status: AC
  Administered 2013-08-09: 10 meq via INTRAVENOUS
  Filled 2013-08-09: qty 50

## 2013-08-09 MED ORDER — MAGNESIUM SULFATE 40 MG/ML IJ SOLN
2.0000 g | Freq: Once | INTRAMUSCULAR | Status: AC
  Administered 2013-08-09: 2 g via INTRAVENOUS
  Filled 2013-08-09: qty 50

## 2013-08-09 MED ORDER — VITAL HIGH PROTEIN PO LIQD
1000.0000 mL | ORAL | Status: DC
  Administered 2013-08-09 (×4)
  Administered 2013-08-09: 1000 mL
  Filled 2013-08-09 (×3): qty 1000

## 2013-08-09 NOTE — Progress Notes (Signed)
Patient ID: GENIENE LIST, female   DOB: Jun 06, 1940, 73 y.o.   MRN: [id number] 8 Days Post-Op  Subjective: Patient intubated and on Precedex.  Tolerating tube feeds at 10 cc/hour  Objective: Vital signs in last 24 hours: Temp:  [99.1 F (37.3 C)-100.2 F (37.9 C)] 100.2 F (37.9 C) (07/21 0832) Pulse Rate:  [101-123] 108 (07/21 0812) Resp:  [15-27] 23 (07/21 0812) BP: (109-152)/(53-75) 134/75 mmHg (07/21 0600) SpO2:  [100 %] 100 % (07/21 0600) FiO2 (%):  [40 %] 40 % (07/21 0812) Last BM Date: 08/06/13  Intake/Output from previous day: 07/20 0701 - 07/21 0700 In: 2476.1 [I.V.:1608.8; NG/GT:180; IV Piggyback:200; TPN:487.3] Out: 5775 [Urine:5575; Stool:200] Intake/Output this shift:    PE: Abd: soft, few bowel sounds, ostomy is working well with good bilious output as well as air. Midline wound is clean, pink, and packed.  Lab Results:   Recent Labs  08/08/13 0500 08/09/13 0410  WBC 17.9* 13.8*  HGB 8.2* 8.7*  HCT 25.7* 27.2*  PLT 186 210   BMET  Recent Labs  08/08/13 1754 08/09/13 0410  NA 149* 150*  K 2.8* 2.9*  CL 106 107  CO2 31 32  GLUCOSE 129* 86  BUN 34* 33*  CREATININE 0.55 0.51  CALCIUM 8.6 8.5   PT/INR No results found for this basename: LABPROT, INR,  in the last 72 hours CMP     Component Value Date/Time   NA 150* 08/09/2013 0410   K 2.9* 08/09/2013 0410   CL 107 08/09/2013 0410   CO2 32 08/09/2013 0410   GLUCOSE 86 08/09/2013 0410   BUN 33* 08/09/2013 0410   CREATININE 0.51 08/09/2013 0410   CALCIUM 8.5 08/09/2013 0410   PROT 5.0* 08/08/2013 0500   ALBUMIN 2.0* 08/08/2013 0500   AST 43* 08/08/2013 0500   ALT 30 08/08/2013 0500   ALKPHOS 72 08/08/2013 0500   BILITOT 0.4 08/08/2013 0500   GFRNONAA >90 08/09/2013 0410   GFRAA >90 08/09/2013 0410   Lipase     Component Value Date/Time   LIPASE 9* 07/29/2013 1605       Studies/Results: Dg Chest Port 1 View  08/09/2013   CLINICAL DATA:  Respiratory failure.  ARDS.  EXAM: PORTABLE CHEST -  1 VIEW  COMPARISON:  08/08/2013.  FINDINGS: The endotracheal tube tip is 3 cm above the carina. The NG tube tip is in the body region of the stomach. The proximal port is near the GE junction and could be advanced several cm. The right IJ catheter is stable.  The cardiac silhouette, mediastinal and hilar contours are stable. Persistent interstitial and airspace process in the lungs and bilateral pleural effusions with left lower lobe atelectasis or infiltrate. No pneumothorax.  IMPRESSION: Stable support apparatus as discussed above. The NG tube could be advanced several cm.  Persistent interstitial and airspace process.   Electronically Signed   By: Kalman Jewels M.D.   On: 08/09/2013 07:23   Dg Chest Port 1 View  08/08/2013   CLINICAL DATA:  Respiratory failure.  Intubation  EXAM: PORTABLE CHEST - 1 VIEW  COMPARISON:  08/07/2013  FINDINGS: Endotracheal tube in good position. NG tube in the stomach. Right jugular catheter tip at the cavoatrial junction. Right arm PICC tip in the SVC. No pneumothorax.  Interval increase in bilateral airspace disease suggestive of edema. Bibasilar atelectasis unchanged. No significant effusion.  IMPRESSION: Support lines remain in good position and unchanged.  Slight increase and diffuse airspace disease suggestive of pulmonary edema. Bibasilar  atelectasis unchanged.   Electronically Signed   By: Franchot Gallo M.D.   On: 08/08/2013 07:37    Anti-infectives: Anti-infectives   Start     Dose/Rate Route Frequency Ordered Stop   08/07/13 1400  piperacillin-tazobactam (ZOSYN) IVPB 3.375 g     3.375 g 12.5 mL/hr over 240 Minutes Intravenous 3 times per day 08/07/13 0921 08/09/13 2359   08/03/13 1800  vancomycin (VANCOCIN) IVPB 750 mg/150 ml premix  Status:  Discontinued     750 mg 150 mL/hr over 60 Minutes Intravenous Every 12 hours 08/03/13 0440 08/04/13 0910   08/03/13 0500  vancomycin (VANCOCIN) 1,250 mg in sodium chloride 0.9 % 250 mL IVPB     1,250 mg 166.7 mL/hr  over 90 Minutes Intravenous  Once 08/03/13 0440 08/03/13 0646   08/03/13 0500  piperacillin-tazobactam (ZOSYN) IVPB 3.375 g  Status:  Discontinued     3.375 g 12.5 mL/hr over 240 Minutes Intravenous 3 times per day 08/03/13 0440 08/07/13 0921   08/01/13 1245  metroNIDAZOLE (FLAGYL) IVPB 500 mg     500 mg 100 mL/hr over 60 Minutes Intravenous To Surgery 08/01/13 1238 08/01/13 1300   07/31/13 0800  cefoTEtan (CEFOTAN) 2 g in dextrose 5 % 50 mL IVPB     2 g 100 mL/hr over 30 Minutes Intravenous On call to O.R. 07/31/13 0741 08/01/13 0559   07/31/13 0800  cefTRIAXone (ROCEPHIN) 2 g in dextrose 5 % 50 mL IVPB  Status:  Discontinued    Comments:  Pharmacy may adjust dosing strength / duration / interval for maximal efficacy   2 g 100 mL/hr over 30 Minutes Intravenous Every 24 hours 07/31/13 0744 08/03/13 0405   07/29/13 2100  ciprofloxacin (CIPRO) IVPB 400 mg  Status:  Discontinued     400 mg 200 mL/hr over 60 Minutes Intravenous Every 12 hours 07/29/13 2058 07/31/13 0744   07/29/13 2100  metroNIDAZOLE (FLAGYL) IVPB 500 mg  Status:  Discontinued     500 mg 100 mL/hr over 60 Minutes Intravenous Every 8 hours 07/29/13 2058 08/03/13 0405   07/29/13 2100  fluconazole (DIFLUCAN) IVPB 200 mg  Status:  Discontinued     200 mg 100 mL/hr over 60 Minutes Intravenous Every 24 hours 07/29/13 2058 08/04/13 0911   07/29/13 1930  ciprofloxacin (CIPRO) IVPB 400 mg     400 mg 200 mL/hr over 60 Minutes Intravenous  Once 07/29/13 1915 07/29/13 2031   07/29/13 1930  metroNIDAZOLE (FLAGYL) IVPB 500 mg  Status:  Discontinued     500 mg 100 mL/hr over 60 Minutes Intravenous  Once 07/29/13 1915 07/29/13 2107       Assessment/Plan   1. POD 8, EXPLORATORY LAPAROTOMY (N/A)  PARTIAL COLECTOMY (N/A)  COLOSTOMY (N/A)  SMALL BOWEL RESECTION (N/A)  2. Postoperative ileus, improving  3. PCM, TNA  4. Ventilator dependent respiratory failure  5. ARDS  Plan: 1. Patient has good ostomy function. It is okay to  advance to 20 cc/hour on her tube feeds today. I agree with CCM that we can wean her TNA to off as her tube feed requirements go up. 2. Continue twice a day dressing changes to abdominal wound. 3. Continue antibiotic therapy 4. Replace potassium per primary service.   LOS: 11 days    Thor Nannini E 08/09/2013, 8:32 AM Pager: [phone]

## 2013-08-09 NOTE — Progress Notes (Signed)
CRITICAL VALUE ALERT  Critical value received:  K+ 2.8  Date of notification:  08/09/2013  Time of notification:  2330  Critical value read back:Yes.    Nurse who received alert:  Fayrene Helper  MD notified (1st page):  Elink RN  Time of first page:  2330  MD notified (2nd page):  Time of second page:  Responding MD:  Warren Lacy RN  Time MD responded:  2330

## 2013-08-09 NOTE — Progress Notes (Signed)
eLink Physician-Brief Progress Note Patient Name: [name] DOB: 1940/10/05 MRN: [id number]  Date of Service  08/09/2013   HPI/Events of Note     eICU Interventions  Potassium replaced   Intervention Category Intermediate Interventions: Electrolyte abnormality - evaluation and management  [name] S. 08/09/2013, 11:54 PM

## 2013-08-09 NOTE — Progress Notes (Signed)
Pt's BP 80s/40s, Dr. Lake Bells called, and new orders to be carried out.

## 2013-08-09 NOTE — Progress Notes (Signed)
eLink Physician-Brief Progress Note Patient Name: [name] DOB: 1940/10/06 MRN: [id number]  Date of Service  08/09/2013   HPI/Events of Note   Severe hypoxemia, no breath sounds on left On vent  eICU Interventions  Bag lavage> good effect Stat CXR > no pneumothorax, infiltrate vs partial collapse LLL Continue pulm toilette   Intervention Category Major Interventions: Respiratory failure - evaluation and management  MCQUAID, DOUGLAS 08/09/2013, 4:12 PM

## 2013-08-09 NOTE — Progress Notes (Signed)
PARENTERAL NUTRITION CONSULT NOTE - Follow-up  Pharmacy Consult for TPN Indication: Bowel Obstruction  Allergies  Allergen Reactions  . Pregabalin Swelling    Tongue swelling  . Sulfonamide Derivatives Swelling    Childhood reaction   Patient Measurements: Height: 5' (152.4 cm) Weight: 175 lb 4.3 oz (79.5 kg) IBW/kg (Calculated) : 45.5  Vital Signs: Temp: 99.2 F (37.3 C) (07/21 0425) Temp src: Oral (07/21 0425) BP: 134/75 mmHg (07/21 0600) Pulse Rate: 101 (07/21 0600) Intake/Output from previous day: 07/20 0701 - 07/21 0700 In: 2476.1 [I.V.:1608.8; NG/GT:180; IV Piggyback:200; TPN:487.3] Out: 5775 [Urine:5575; Stool:200] Intake/Output from this shift:  Labs:  Recent Labs  08/07/13 0420 08/08/13 0500 08/09/13 0410  WBC 18.6* 17.9* 13.8*  HGB 7.9* 8.2* 8.7*  HCT 24.4* 25.7* 27.2*  PLT 197 186 210    Recent Labs  08/07/13 0420 08/08/13 0500 08/08/13 1754 08/09/13 0410  NA 147 148* 149* 150*  K 3.7 4.2 2.8* 2.9*  CL 110 111 106 107  CO2 26 28 31  32  GLUCOSE 84 80 129* 86  BUN 31* 34* 34* 33*  CREATININE 0.49* 0.47* 0.55 0.51  CALCIUM 9.4 9.1 8.6 8.5  MG  --  2.0 1.7  --   PHOS  --  3.8  --   --   PROT 4.5* 5.0*  --   --   ALBUMIN 1.7* 2.0*  --   --   AST 36 43*  --   --   ALT 21 30  --   --   ALKPHOS 62 72  --   --   BILITOT 0.3 0.4  --   --   PREALBUMIN  --  14.1*  --   --   TRIG  --  108  --   --    Estimated Creatinine Clearance: 57.6 ml/min (by C-G formula based on Cr of 0.51).   CBG (last 3)   Recent Labs  08/08/13 2026 08/09/13 0007 08/09/13 0410  GLUCAP 115* 81 83    Insulin Requirements in the past 12 hours:  0 units Novolog SSI; 90 units regular insulin in TPN (pt receives about 65 units)  Current Nutrition:  Clinimix E 5/15 at 80 ml/hr and 20% lipids at 10 ml/hr provides 1843 kcal and 96g protein Vital High Protein at 10 ml/hr provides 240 kcal and 21 gm protein  Nutritional Goals:  1435 kcal, >/= 91 gm protein (per RD note  7/13)  Assessment: Admit: 73 yo female admitted with c/o abdominal pain associated with N&V.  She has been admitted to [facility] multiple times over the past few months with a similar complaint.  Of note, past surgical history reveals an appendectomy, cholecystectomy, colon resection and an abdominal hysterectomy.  GI: Pt with prolonged obstruction and expect prolonged post-op ileus. S/p exp lap/LOA/partial colectomy/end-colostomy, small bowel resection, repair of multiple enterotomies 7/13. Trickle feeds started with plan to not advance. Prealb 14.1  Endo: No h/o DM. CBGs  81-115. Pt continues on resistant SSI with insulin in TPN with no sliding scale insulin administered.  Plan to continue steroids while on pressors  Lytes: Na 150, K 2.9, Mag 1.7  KCl 40 mEq tid for 3 doses ordered last night.  Will also give KCl 10 mEq IV X 3 and magnesium 2gm IV X 1   Renal: SCr stable.  UOP 2.9 ml/kg/hr  Rec'd 60 mg IV lasix yesterday am  Pulm: Remains intubated. Fio2 40%.  Cards: CAD, HTN. Off pressors  Hepatobil: LFTs wnl. TG  108  Neuro: Sedated with fent gtt and precedex .GCS 11 h/o depression. Sertraline PTA  ID: Zosyn  Wbc 13.8. PCT decreasing to 1.02.  Best Practices: Heparin drip for DVT, PPI IV (home med)  Home meds: Will need resumed once pt able to take po  TPN Access: Double lumen PICC placed 7/12  TPN day#: 9  Plan:  1.  Decrease Clinimix E 5/15 to 60 ml/hr and continue 20% lipids at 10 ml/hr. This will provide 1502 kcal and 72g protein which combined with TFs will meet 100% protein needs. 2.  Replete Mag and K as above 3.  F/u advancement of TFs 4.  Keep insulin at 90 units/2 L bag.  Pt will receive ~57 units insulin. 5.  F/u am labs  Excell Seltzer, PharmD Clinical Pharmacist [phone]   08/09/2013,7:17 AM

## 2013-08-09 NOTE — Progress Notes (Signed)
CRITICAL VALUE ALERT  Critical value received:  K+ 2.9  Date of notification:  08/09/2013  Time of notification:  1031  Critical value read back:Yes.    Nurse who received alert:  Fayrene Helper  MD notified (1st page):  Althea Grimmer to Dr. Jimmy Footman  Time of first page:  [phone]  MD notified (2nd page):  Time of second page:  Responding MD:  Althea Grimmer to Dr. Jimmy Footman  Time MD responded:  [phone]

## 2013-08-09 NOTE — Progress Notes (Signed)
ANTICOAGULATION CONSULT NOTE - Follow Up Consult  Pharmacy Consult for Heparin Indication: R axillary vein DVT  Allergies  Allergen Reactions  . Pregabalin Swelling    Tongue swelling  . Sulfonamide Derivatives Swelling    Childhood reaction    Patient Measurements: Height: 5' (152.4 cm) Weight: 175 lb 4.3 oz (79.5 kg) IBW/kg (Calculated) : 45.5  Vital Signs: Temp: 100.2 F (37.9 C) (07/21 0832) Temp src: Oral (07/21 0832) BP: 175/71 mmHg (07/21 1100) Pulse Rate: 106 (07/21 1150)  Labs:  Recent Labs  08/07/13 0420 08/08/13 0500 08/08/13 1754 08/08/13 2040 08/09/13 0410  HGB 7.9* 8.2*  --   --  8.7*  HCT 24.4* 25.7*  --   --  27.2*  PLT 197 186  --   --  210  HEPARINUNFRC  --   --   --  0.37 0.63  CREATININE 0.49* 0.47* 0.55  --  0.51  TROPONINI  --   --  <0.30  --   --     Estimated Creatinine Clearance: 57.6 ml/min (by C-G formula based on Cr of 0.51).   Medications:  Heparin @ 1100 units/hr  Assessment: 33 YOF who [facility] 7[facility]10 with LLQ pain and was found to have a bowel obstruction, s/p repair on 7/13. The patient was found to have a right axillary vein DVT on 7/17 via dopplers however treatment was held initially due to the recent surgery. It was determined that the patient's risks of getting a new clot exceeded the patient's risk for bleeding and pharmacy was consulted to start full dose heparin on 7/20. Per CCM - no boluses due to recent surgery  The heparin level this morning remains therapeutic though trending up (HL 0.63 << 0.37, goal of 0.3-0.7). CBC stable - no overt s/sx of bleeding noted.   Will also change PPI from IV to PT today since the patient has been tolerating TFs for >24 hours.   Goal of Therapy:  Heparin level 0.3-0.7 units/ml Monitor platelets by anticoagulation protocol: Yes   Plan:  1. Reduce heparin drip rate slightly to 1050 units/hr (10.5 ml/hr) to keep within range 2. Change protonix from IV to PT per P&T  protocol 3. Will continue to monitor for any signs/symptoms of bleeding and will follow up with heparin level in the a.m.   Alycia Rossetti, PharmD, BCPS Clinical Pharmacist Pager: [phone] 08/09/2013 12:08 PM

## 2013-08-09 NOTE — Progress Notes (Signed)
I have seen and examined the pt and agree with PA-Osborne's progress note. OK to adv TFs

## 2013-08-09 NOTE — Progress Notes (Signed)
PULMONARY / CRITICAL CARE MEDICINE   Name: [name] MRN: [id number] DOB: 05-Jun-1940    ADMISSION DATE:  07/29/2013 CONSULTATION DATE:  08/01/2013  REFERRING MD :  Surgery PRIMARY SERVICE: PCCM  CHIEF COMPLAINT:  Bowel obstruction  BRIEF PATIENT DESCRIPTION: 73 y/o [name] with PMH CAD, HTN, Barrett's esophagus, presented to [facility] ED 7/10 with LLQ pain. Found to have bowel obstruction. To OR for repair 7/13. Remains of vent post-op. Consulted PCCM.  SIGNIFICANT EVENTS / STUDIES: 7/10 Admit, CT Abd - dilated prox colon w/ abrupt transition c/w with obstruction, diverticulitis vs colitis, Surgery c/s 7/12 Empiric Cipro/Flagyl, Cardiology consulted for pre-op evaluation, TPN per pharm 7/13 - Laparotomy, partial colectomy, colostomy, SB resection, repair of enterotomy, lysis of adhesions         - Hgb down to 6.8 >> imp to 10.8 s/p 2u PRBC 7/14 - Extubation, CXR stable, Hgb 9.5, o/n tachycardic >> Cards start Esmolol, IVF         - o/n required re-intubation due to hypoxemic resp failure, hypotension, concern for aspiration         - DC Esmolol, start Vaso, switch to Vanc / Zosyn 7/15 - CXR (worsening bilateral airspace disease, suspect edema), concern for volume overload despite CVP 5-6         - Add Vaso, Trop (neg), BNP 3782, PCT 1.83, ECHO (normal LVEF) 7/16 - Considered ARDS (unclear etiology, ?aspiration), CXR (persistent b/l edema), cont pressors, stable low PCT 1.9 7/17 - o/n Hgb 8.2 >> 6.9, received 1u PRBC, titrating down Neo, CVP 15-16         - Off Neo, remains on Vaso, CXR (mild improved b/l pulm edema) 7/17 - CT abd >no bleeding , no abscess, sm fluid in abd/pelvis  7/17 - Ven Dopp>+DVT Rt axillary vein 7/18 - off Vaso 7/19 - on intermittent Neo, CXR mild improved b/l diffuse interstitial edema, trial SBT anticipate extubate 1-2 days         - start trickle tube feeds (per surgery) 7/20 - off Neo, cont SBT, CXR (mild improved / stable diffuse interstitial edema, Hgb  improved 7.9 >> 8.2 7/21 - remains off pressors, on Precedex (minimal Fentanyl), good UOP to Lasix $Remove'60mg'[id number]$ , Hypo K to 2.8  LINES / TUBES: ETT 7/13 >> 7/14, 7/14 >> PICC 7/12 >>7/20 R IJ CVL 7/14 >>  L radial arterial cath 7/15 >>   CULTURES: Blood x 2, 7/15 >> negative Respiratory 7/15 >> few Candida  ANTIBIOTICS: Ceftriaxone 7/10 >>> 7/14 Flagyl 7/10 >>> 7/14 Vancomycin 7/14 >>> 7/16 Fluconazole 7/10 >>> 7/16 Zosyn 7/14 >> 7/21  SUBJECTIVE:  Overnight pt noted to have short runs of VTach and found to be hypoKalemic to 2.8, replaced. Per RN / RT, transitioned to Precedex yesterday. Today persistent poor mental status without following commands.  VITAL SIGNS: Temp:  [98.6 F (37 C)-100.2 F (37.9 C)] 99.2 F (37.3 C) (07/21 0425) Pulse Rate:  [91-123] 108 (07/21 0500) Resp:  [15-27] 26 (07/21 0500) BP: (108-167)/(47-85) 139/73 mmHg (07/21 0500) SpO2:  [100 %] 100 % (07/21 0500) FiO2 (%):  [40 %] 40 % (07/21 0425) HEMODYNAMICS: CVP:  [5 mmHg-8 mmHg] 8 mmHg VENTILATOR SETTINGS: Vent Mode:  [-] PRVC FiO2 (%):  [40 %] 40 % Set Rate:  [12 bmp-16 bmp] 12 bmp Vt Set:  [450 mL-480 mL] 450 mL PEEP:  [5 cmH20] 5 cmH20 Plateau Pressure:  [10 URK27-06 cmH20] 15 cmH20 INTAKE / OUTPUT: Intake/Output     07/20 0701 - 07/21 0700  I.V. (mL/kg) 1531.2 (19.3)   NG/GT 180   IV Piggyback 200   TPN 487.3   Total Intake(mL/kg) 2398.5 (30.2)   Urine (mL/kg/hr) 5275 (2.8)   Stool 200 (0.1)   Total Output 5475   Net -3076.5         PHYSICAL EXAMINATION: General:  On vent, improved agitation Neuro:  Sedated RASS -1 to -3, not following commands Cardiovascular:  Tachycardic, regular rhythm, no murmurs Lungs: Unchanged bilateral rhonchi with coarse breath sounds Abdomen:  Soft, abd dressing in place. LLQ colostomy functioning (liquid brown/green stool), improved +BS Ext: Mild improvement in b/l LE edea +2 pitting, and improve b/l UE non-pitting edema  LABS:  CBC  Recent Labs Lab  08/07/13 0420 08/08/13 0500 08/09/13 0410  WBC 18.6* 17.9* 13.8*  HGB 7.9* 8.2* 8.7*  HCT 24.4* 25.7* 27.2*  PLT 197 186 210   Coag's No results found for this basename: APTT, INR,  in the last 168 hours BMET  Recent Labs Lab 08/08/13 0500 08/08/13 1754 08/09/13 0410  NA 148* 149* 150*  K 4.2 2.8* 2.9*  CL 111 106 107  CO2 28 31 32  BUN 34* 34* 33*  CREATININE 0.47* 0.55 0.51  GLUCOSE 80 129* 86   Electrolytes  Recent Labs Lab 08/03/13 0400 08/04/13 0341  08/08/13 0500 08/08/13 1754 08/09/13 0410  CALCIUM 8.0* 7.9*  < > 9.1 8.6 8.5  MG 1.5 2.0  --  2.0 1.7  --   PHOS 2.5 3.5  --  3.8  --   --   < > = values in this interval not displayed. Sepsis Markers  Recent Labs Lab 08/03/13 0930 08/04/13 0341 08/05/13 0453 08/07/13 0420  LATICACIDVEN  --   --   --  0.9  PROCALCITON 1.83 1.97 1.02  --    ABG  Recent Labs Lab 08/04/13 0413 08/04/13 1130 08/08/13 0941  PHART 7.206* 7.235* 7.575*  PCO2ART 49.8* 49.2* 31.6*  PO2ART 52.0* 130.0* 88.0   Liver Enzymes  Recent Labs Lab 08/04/13 0341 08/07/13 0420 08/08/13 0500  AST 33 36 43*  ALT _0 ALKPHOS 55 62 72  BILITOT 0.3 0.3 0.4  ALBUMIN 1.5* 1.7* 2.0*   Cardiac Enzymes  Recent Labs Lab 08/03/13 0930 08/08/13 1754  TROPONINI <0.30 <0.30  PROBNP 3782.0*  --    Glucose  Recent Labs Lab 08/08/13 0746 08/08/13 1230 08/08/13 1548 08/08/13 2026 08/09/13 0007 08/09/13 0410  GLUCAP 82 143* 94 115* 42 83    Imaging - noted   ASSESSMENT / PLAN:  PULMONARY A: Acute respiratory failure (post-op), req re-intubation 7/14 following extubation with hypoxemic failure - On vent ARDS, unclear etiology, question aspiration Ruled out Cardiogenic Etiology of Pulm Edema - 7/15 ECHO (nml LVEF) CAP, presumed P:   Full mech vent support Daily SBT, cpap 5 ps 5, goal 2 hrs, delirium still a concern on extubation Goal neg balance now, successful CXR with reduced edema / improved aeration,  monitor CXR while on vent with diuresis abg in am required for prior alk  CARDIOVASCULAR A:  Shock, consistent with Septic etiology, presumed CAP source - Resolved. Off pressors, nml lactate Hemorrhagic Shock - Resolved. No evidence of bleeding on CT Abd Tachycardia H/o CAD, HTN Elevated BNP, but Preserved LVEF - No evidence cardiogenic shock - 7/15 ECHO (nml EF) DVT Rt axillary vein - RUE PICC removed 7/20 P:  HTN increased, add metoprolol home 12.5 bid Consider cont Lasix IV for diuresis with goal CVP 4-8, as  tolerated  Hold plavix, lipitor, ASA  RENAL A:   Non-gap  Metabolic Acidosis - Resolved Hypokalemia - 2.8 Hypernatremia - 150 P:   Replace K, repeat BMET @ 1400 BMET in AM and pm d5w increase 75 cc/hr lasix maintain ,did well with this   GASTROINTESTINAL A:   Suspected colitis with colonic obstruction/SBO s/p partial colectomy with new colostomy 7/13 Ileus, post-op Hx of Barrett's esophagus Nutrition P:   Post op care Nutrition per surgery - increase tube feeds to 20 cc/hr (x 24 hr) as tolerated, maintain @ 20 cc, no titration up Discontinue TPN today Wound/ostomy care consult Continue PPI   HEMATOLOGIC A:   Acute blood loss anemia, post-op - Hgb improved, 8.7 - no bleeding noted Dilutional anemia as well DVT Rt axillary vein  RUE PICC removed 7/20 VTE ppx P:  SCD's CBC daily Transfuse for Hb < 7 or bleeding Continue Heparin per pharm for acute RUE DVT, as risk PE / DVT higher than risk bleeding currently  INFECTIOUS A:   Colitis, s/p partial colectomy Presumed CAP, question aspiration PNA - Improved WBC to 13.8 P:   - Antibiotic coverage with Zosyn, set to stop today 7/21 - Cultures negative as above  ENDOCRINE A: Septic Shock, requiring stress dose steroids   Hyperglycemia - improved, now HTN No hx DM P:   Dc roids, HTN now Resistant SSI When tpn off , add d5 as had large amount insulin in it  NEUROLOGIC A:   Acute encephalopathy -  Continued poor mental status Sedated on Mechanical Vent Delirium  P:   RASS goal: -, dc versed, NOT a home med Continue Precedex to minimize agitation Titrate down Fentanyl gtt to minimize sedation PT unable Add Risperdal 0.5 bid Wua, behaviroal modifiers PT when able  DERMATOLOGY A: Urticaria >> suspect may be related to diflucan vs opiates - Resolved P: Monitor PRN benadryl  GLOBAL: Cont vent support for ARDS with continue daily SBT, improved UOP (5.6 L out) to Lasix yesterday with goal neg balance off pressors, complete antibiotics today with Zosyn, cont on Heparin for RUE DVT, Surgery assisting with nutrition, increased TF to 20 cc and DC TPN today. K supp, add Risperdal    Nobie Putnam, [place], PGY-2  08/09/2013, 5:55 AM Ccm time 30 min   I have fully examined this patient and agree with above findings.     Lavon Paganini. Titus Mould, MD, [place] Pgr: [facility]

## 2013-08-09 NOTE — Progress Notes (Signed)
[place] Progress Note Patient Name: [name] DOB: 16-Feb-1940 MRN: [id number]  Date of Service  08/09/2013   HPI/Events of Note   Mild hypotension  eICU Interventions  Saline bolus 500cc   Intervention Category Intermediate Interventions: Hypotension - evaluation and management  Yeslin Delio 08/09/2013, 5:33 PM

## 2013-08-09 NOTE — Progress Notes (Signed)
eLink Physician-Brief Progress Note Patient Name: [name] DOB: April 01, 1940 MRN: [id number]  Date of Service  08/09/2013   HPI/Events of Note  Hypotensive again despite fluid bolus Fever noted cvp ~6 on vent but 16 L positive for hospitalization  eICU Interventions  Levophed Cbc, bmet, procalcitonin, lactic acid, abg now ?broaden abx? Will f/u labs before changing antimicrobials   Intervention Category Major Interventions: Hypotension - evaluation and management  MCQUAID, DOUGLAS 08/09/2013, 9:21 PM

## 2013-08-09 NOTE — Progress Notes (Signed)
CRITICAL VALUE ALERT  Critical value received:  K+ 2.8  Date of notification:  08/08/13  Time of notification:  1920  Critical value read back:Yes.    Nurse who received alert:  Fayrene Helper   MD notified (1st page):  Dr. Joya Gaskins  Time of first page:  [phone]  MD notified (2nd page):  Time of second page:  Responding MD:  Dr. Joya Gaskins  Time MD responded:  [phone]

## 2013-08-10 ENCOUNTER — Inpatient Hospital Stay (HOSPITAL_COMMUNITY): Payer: Medicare HMO

## 2013-08-10 LAB — GLUCOSE, CAPILLARY
GLUCOSE-CAPILLARY: 164 mg/dL — AB (ref 70–99)
GLUCOSE-CAPILLARY: 182 mg/dL — AB (ref 70–99)
Glucose-Capillary: 151 mg/dL — ABNORMAL HIGH (ref 70–99)
Glucose-Capillary: 136 mg/dL — ABNORMAL HIGH (ref 70–99)
Glucose-Capillary: 167 mg/dL — ABNORMAL HIGH (ref 70–99)
Glucose-Capillary: 169 mg/dL — ABNORMAL HIGH (ref 70–99)

## 2013-08-10 LAB — CBC
HEMATOCRIT: 23.6 % — AB (ref 36.0–46.0)
Hemoglobin: 7.4 g/dL — ABNORMAL LOW (ref 12.0–15.0)
MCH: 28.8 pg (ref 26.0–34.0)
MCHC: 31.4 g/dL (ref 30.0–36.0)
MCV: 91.8 fL (ref 78.0–100.0)
PLATELETS: 257 10*3/uL (ref 150–400)
RBC: 2.57 MIL/uL — AB (ref 3.87–5.11)
RDW: 17.4 % — ABNORMAL HIGH (ref 11.5–15.5)
WBC: 14.1 10*3/uL — AB (ref 4.0–10.5)

## 2013-08-10 LAB — HEPARIN LEVEL (UNFRACTIONATED)
HEPARIN UNFRACTIONATED: 0.56 [IU]/mL (ref 0.30–0.70)
Heparin Unfractionated: 0.97 IU/mL — ABNORMAL HIGH (ref 0.30–0.70)
Heparin Unfractionated: 0.59 IU/mL (ref 0.30–0.70)

## 2013-08-10 LAB — BASIC METABOLIC PANEL
Anion gap: 9 (ref 5–15)
BUN: 26 mg/dL — ABNORMAL HIGH (ref 6–23)
CO2: 29 meq/L (ref 19–32)
Calcium: 7.5 mg/dL — ABNORMAL LOW (ref 8.4–10.5)
Chloride: 106 mEq/L (ref 96–112)
Creatinine, Ser: 0.48 mg/dL — ABNORMAL LOW (ref 0.50–1.10)
GFR calc Af Amer: >90 mL/min (ref >90)
GLUCOSE: 234 mg/dL — AB (ref 70–99)
POTASSIUM: 3.1 meq/L — AB (ref 3.7–5.3)
Sodium: 144 mEq/L (ref 137–147)

## 2013-08-10 LAB — BLOOD GAS, ARTERIAL
ACID-BASE EXCESS: 7.9 mmol/L — AB (ref 0.0–2.0)
Bicarbonate: 31.6 mEq/L — ABNORMAL HIGH (ref 20.0–24.0)
Drawn by: 39899
FIO2: 0.5 %
PATIENT TEMPERATURE: 98.6
PEEP: 5 cmH2O
PH ART: 7.497 — AB (ref 7.350–7.450)
RATE: 12 resp/min
TCO2: 32.8 mmol/L (ref 0–100)
VT: 450 mL
pCO2 arterial: 41.1 mmHg (ref 35.0–45.0)
pO2, Arterial: 152 mmHg — ABNORMAL HIGH (ref 80.0–100.0)

## 2013-08-10 LAB — PROCALCITONIN: Procalcitonin: <0.1 ng/mL

## 2013-08-10 LAB — MAGNESIUM: Magnesium: 2.1 mg/dL (ref 1.5–2.5)

## 2013-08-10 LAB — PHOSPHORUS: Phosphorus: 2.2 mg/dL — ABNORMAL LOW (ref 2.3–4.6)

## 2013-08-10 MED ORDER — POTASSIUM CHLORIDE 20 MEQ/15ML (10%) PO LIQD
30.0000 meq | ORAL | Status: AC
  Administered 2013-08-10 (×2): 30 meq
  Filled 2013-08-10 (×3): qty 30

## 2013-08-10 MED ORDER — VITAL HIGH PROTEIN PO LIQD
1000.0000 mL | ORAL | Status: DC
  Administered 2013-08-11 (×2): 1000 mL
  Filled 2013-08-10 (×5): qty 1000

## 2013-08-10 MED ORDER — METOPROLOL TARTRATE 12.5 MG HALF TABLET
12.5000 mg | ORAL_TABLET | Freq: Two times a day (BID) | ORAL | Status: DC
  Filled 2013-08-10 (×2): qty 1

## 2013-08-10 MED ORDER — SODIUM CHLORIDE 0.45 % IV SOLN
INTRAVENOUS | Status: DC
  Administered 2013-08-10: 09:00:00 via INTRAVENOUS

## 2013-08-10 MED ORDER — HYDROCORTISONE NA SUCCINATE PF 100 MG IJ SOLR
50.0000 mg | Freq: Four times a day (QID) | INTRAMUSCULAR | Status: DC
  Administered 2013-08-10 – 2013-08-14 (×16): 50 mg via INTRAVENOUS
  Filled 2013-08-10 (×20): qty 1

## 2013-08-10 MED ORDER — POTASSIUM CHLORIDE 20 MEQ/15ML (10%) PO LIQD
ORAL | Status: AC
  Filled 2013-08-10: qty 30

## 2013-08-10 NOTE — Progress Notes (Signed)
[facility] ADULT ICU REPLACEMENT PROTOCOL FOR AM LAB REPLACEMENT ONLY  The patient does apply for the [facility] Adult ICU Electrolyte Replacment Protocol based on the criteria listed below:   1. Is GFR >/= 40 ml/min? Yes.    Patient's GFR today is >90 2. Is urine output >/= 0.5 ml/kg/hr for the last 6 hours? Yes.   Patient's UOP is .58 ml/kg/hr 3. Is BUN < 60 mg/dL? Yes.    Patient's BUN today is 26 4. Abnormal electrolyte K 3.1 5. Ordered repletion with: per protocol 6. If a panic level lab has been reported, has the CCM MD in charge been notified? Yes.  .   Physician:  Dr Zenovia Jarred 08/10/2013 6:27 AM

## 2013-08-10 NOTE — Progress Notes (Signed)
ANTICOAGULATION CONSULT NOTE - Follow Up Consult  Pharmacy Consult for heparin Indication: DVT  Labs:  Recent Labs  08/08/13 0500 08/08/13 1754 08/08/13 2040 08/09/13 0410 08/09/13 1400 08/09/13 2230 08/10/13 0500  HGB 8.2*  --   --  8.7*  --  7.0*  --   HCT 25.7*  --   --  27.2*  --  21.6*  --   PLT 186  --   --  210  --  200  --   HEPARINUNFRC  --   --  0.37 0.63  --   --  0.97*  CREATININE 0.47* 0.55  --  0.51 0.49* 0.54  --   TROPONINI  --  <0.30  --   --   --   --   --     Assessment: 73yo female now supratherapeutic on heparin despite rate decrease for level trending up.  Goal of Therapy:  Heparin level 0.3-0.7 units/ml   Plan:  Will decrease heparin gtt by 2 units/kg/hr to 900 units/hr and check level in [place], PharmD, BCPS  08/10/2013,5:59 AM

## 2013-08-10 NOTE — Progress Notes (Signed)
NUTRITION FOLLOW UP  Intervention:    Increase Vital High Protein by 10 ml every 4 hours to goal rate of 50 ml/h (1200 ml/day) to provide 1200 kcals (69% of estimated needs), 105 gm protein, 1003 ml free water daily.   Nutrition Dx:   Inadequate oral intake related to inability to eat as evidenced by NPO status. Ongoing.  Goal:   Intake to meet >90% of estimated nutrition needs. Unmet but progressing.  Monitor:   TF tolerance/adequacy, weight trend, labs, vent status.  Assessment:   PMHx significant for diverticulitis, CAD, HTN, depression. Admitted with abdominal pain and c/n x 2 weeks. Hx of lysis of adhesions and ovarian cystectomy for SBO in April 2015. Work-up reveals colonic obstruction proximal to descending colon, MD suspects etiology of ischemia vs malignancy.  S/P laparotomy, partial colectomy, colostomy, SB resection, repair of enterotomy, lysis of adhesions on 7/13. Was able to extubate on 7/14, but required re-intubation the same day. Received TPN from 7/13 to 7/20 when TPN was discontinued.  TF was initiated on 7/19 with Vital High Protein at 10 ml/h, currently at 20 ml/h providing 480 kcals, 42 gm protein, 401 ml free water daily. Per discussion with RN and in ICU rounds, plans to advance TF rate as tolerated to goal rate now that TPN is off. Surgery is following patient and agreeable to advancing TF as tolerated.  Patient remains intubated on ventilator support MV: 12.3 L/min Temp (24hrs), Avg:99.9 F (37.7 C), Min:98.6 F (37 C), Max:100.6 F (38.1 C)   Height: Ht Readings from Last 1 Encounters:  08/02/13 5' (1.524 m)    Ideal weight: 45.5 kg   Weight Status:   Wt Readings from Last 1 Encounters:  08/08/13 175 lb 4.3 oz (79.5 kg)  07/29/13  154 lb 3.2 oz (69.945 kg)  BMI=30.1  Re-estimated needs:  Kcal: 1740 Protein: >/= 91 gm Fluid: 2 L  Skin: abdominal incision  Diet Order:  NPO   Intake/Output Summary (Last 24 hours) at 08/10/13 1231 Last  data filed at 08/10/13 0741  Gross per 24 hour  Intake 3757.94 ml  Output   2110 ml  Net 1647.94 ml    Last BM: 7/18   Labs:   Recent Labs Lab 08/04/13 0341  08/08/13 0500 08/08/13 1754  08/09/13 1400 08/09/13 2230 08/10/13 0500  NA 132*  < > 148* 149*  < > 147 146 144  K 4.3  < > 4.2 2.8*  < > 3.2* 2.8* 3.1*  CL 100  < > 111 106  < > 107 107 106  CO2 18*  < > 28 31  < > 33* 31 29  BUN 27*  < > 34* 34*  < > 29* 27* 26*  CREATININE 0.91  < > 0.47* 0.55  < > 0.49* 0.54 0.48*  CALCIUM 7.9*  < > 9.1 8.6  < > 7.6* 7.3* 7.5*  MG 2.0  --  2.0 1.7  --   --   --  2.1  PHOS 3.5  --  3.8  --   --   --   --  2.2*  GLUCOSE 220*  < > 80 129*  < > 93 173* 234*  < > = values in this interval not displayed.  CBG (last 3)   Recent Labs  08/09/13 2344 08/10/13 0350 08/10/13 0754  GLUCAP 182* 164* 151*    Scheduled Meds: . antiseptic oral rinse  15 mL Mouth Rinse QID  . chlorhexidine  15 mL Mouth  Rinse BID  . feeding supplement (VITAL HIGH PROTEIN)  1,000 mL Per Tube Q24H  . hydrocortisone sod succinate (SOLU-CORTEF) inj  50 mg Intravenous Q6H  . insulin aspart  0-20 Units Subcutaneous 6 times per day  . lip balm  1 application Topical BID  . pantoprazole sodium  40 mg Per Tube Daily  . risperiDONE  0.5 mg Per Tube BID    Continuous Infusions: . sodium chloride 50 mL/hr at 08/10/13 0929  . dexmedetomidine 0.3 mcg/kg/hr (08/10/13 1143)  . fentaNYL infusion INTRAVENOUS 25 mcg/hr (08/09/13 0400)  . heparin 900 Units/hr (08/10/13 5379)  . norepinephrine (LEVOPHED) Adult infusion 6 mcg/min (08/10/13 1143)    Molli Barrows, RD, LDN, [place] Pager [phone] After Hours Pager [phone]

## 2013-08-10 NOTE — Progress Notes (Signed)
Patient ID: [name], female   DOB: 05-Nov-1940, 73 y.o.   MRN: [id number] 9 Days Post-Op  Subjective: Patient intubated and sedated. Seems to be tolerating her tube feeds at 20 cc/HR was no significant residuals.  Objective: Vital signs in last 24 hours: Temp:  [98.6 F (37 C)-101.2 F (38.4 C)] 98.6 F (37 C) (07/22 0439) Pulse Rate:  [82-130] 101 (07/22 0700) Resp:  [18-38] 25 (07/22 0700) BP: (58-178)/(31-78) 127/56 mmHg (07/22 0700) SpO2:  [100 %] 100 % (07/22 0615) FiO2 (%):  [40 %-100 %] 40 % (07/22 0400) Last BM Date: 08/06/13  Intake/Output from previous day: 07/21 0701 - 07/22 0700 In: 4116.4 [I.V.:2596.4; NG/GT:320; IV Piggyback:1200] Out: 3210 [Urine:2760; Stool:450] Intake/Output this shift: Total I/O In: 120 [Other:120] Out: 400 [Stool:400]  PE: Abd: soft, wound is pale pink. Base of wound is mostly necrotic and liquefied fat. Her PDS sutures are still in place however from about the middle of the wound inferiorly your finger can be stuck straight through the PDS sutures indicating a lack of healing. Her ostomy is in place with good output.  Lab Results:   Recent Labs  08/09/13 2230 08/10/13 0500  WBC 13.2* 14.1*  HGB 7.0* 7.4*  HCT 21.6* 23.6*  PLT 200 257   BMET  Recent Labs  08/09/13 2230 08/10/13 0500  NA 146 144  K 2.8* 3.1*  CL 107 106  CO2 31 29  GLUCOSE 173* 234*  BUN 27* 26*  CREATININE 0.54 0.48*  CALCIUM 7.3* 7.5*   PT/INR No results found for this basename: LABPROT, INR,  in the last 72 hours CMP     Component Value Date/Time   NA 144 08/10/2013 0500   K 3.1* 08/10/2013 0500   CL 106 08/10/2013 0500   CO2 29 08/10/2013 0500   GLUCOSE 234* 08/10/2013 0500   BUN 26* 08/10/2013 0500   CREATININE 0.48* 08/10/2013 0500   CALCIUM 7.5* 08/10/2013 0500   PROT 5.0* 08/08/2013 0500   ALBUMIN 2.0* 08/08/2013 0500   AST 43* 08/08/2013 0500   ALT 30 08/08/2013 0500   ALKPHOS 72 08/08/2013 0500   BILITOT 0.4 08/08/2013 0500   GFRNONAA  >90 08/10/2013 0500   GFRAA >90 08/10/2013 0500   Lipase     Component Value Date/Time   LIPASE 9* 07/29/2013 1605       Studies/Results: Dg Chest Port 1 View  08/10/2013   CLINICAL DATA:  Ventilated patient status post partial colectomy and colostomy creation  EXAM: PORTABLE CHEST - 1 VIEW  COMPARISON:  Portable chest x-ray of August 09, 2013  FINDINGS: The lungs are well-expanded. There remains increased retrocardiac density on the left with obscuration of the hemidiaphragm. The cardiac silhouette is top-normal in size. The pulmonary vascularity is normal today. The interstitial markings have improved considerably.  The endotracheal tube tip lies approximately 2.4 cm above the crotch of the carina. The esophagogastric tube tip lies in the gastric cardia with the proximal port at the level of the GE junction. The right internal jugular venous catheter tip lies mid SVC.  IMPRESSION: 1. Improving pulmonary interstitial edema. Persistent left lower lobe atelectasis and small pleural effusion. 2. The endotracheal tube and internal jugular catheter are in appropriate position. The esophagogastric tube should be advanced 5 5-10 cm to assure that the proximal port remains below the GE junction. These results will be called to the ordering clinician or representative by the Radiologist Assistant, and communication documented in the PACS or zVision Dashboard.  Electronically Signed   By: David  Martinique   On: 08/10/2013 07:26   Dg Chest Port 1 View  08/09/2013   CLINICAL DATA:  Dyspnea.  Abnormal breath sounds.  Short of breath.  EXAM: PORTABLE CHEST - 1 VIEW  COMPARISON:  08/09/2013.  FINDINGS: Cardiopericardial silhouette within normal limits for projection. Bilateral pleural effusions are present which appears small. Diffuse bilateral basilar predominant airspace disease is present compatible with moderate CHF.  Endotracheal tube tip is 3 cm from the carina. Enteric tube is present with the tip in the stomach.  The proximal side port is at the gastroesophageal junction and should be advanced about 5 cm.  RIGHT IJ central line is present with the tip in the mid SVC. No pneumothorax.  IMPRESSION: 1. Unchanged moderate CHF. 2. Support apparatus detailed above. Enteric tube should be advanced about 5 cm to prevent gastroesophageal reflux.   Electronically Signed   By: Dereck Ligas M.D.   On: 08/09/2013 15:46   Dg Chest Port 1 View  08/09/2013   CLINICAL DATA:  Respiratory failure.  ARDS.  EXAM: PORTABLE CHEST - 1 VIEW  COMPARISON:  08/08/2013.  FINDINGS: The endotracheal tube tip is 3 cm above the carina. The NG tube tip is in the body region of the stomach. The proximal port is near the GE junction and could be advanced several cm. The right IJ catheter is stable.  The cardiac silhouette, mediastinal and hilar contours are stable. Persistent interstitial and airspace process in the lungs and bilateral pleural effusions with left lower lobe atelectasis or infiltrate. No pneumothorax.  IMPRESSION: Stable support apparatus as discussed above. The NG tube could be advanced several cm.  Persistent interstitial and airspace process.   Electronically Signed   By: Kalman Jewels M.D.   On: 08/09/2013 07:23    Anti-infectives: Anti-infectives   Start     Dose/Rate Route Frequency Ordered Stop   08/07/13 1400  piperacillin-tazobactam (ZOSYN) IVPB 3.375 g     3.375 g 12.5 mL/hr over 240 Minutes Intravenous 3 times per day 08/07/13 0921 08/10/13 0101   08/03/13 1800  vancomycin (VANCOCIN) IVPB 750 mg/150 ml premix  Status:  Discontinued     750 mg 150 mL/hr over 60 Minutes Intravenous Every 12 hours 08/03/13 0440 08/04/13 0910   08/03/13 0500  vancomycin (VANCOCIN) 1,250 mg in sodium chloride 0.9 % 250 mL IVPB     1,250 mg 166.7 mL/hr over 90 Minutes Intravenous  Once 08/03/13 0440 08/03/13 0646   08/03/13 0500  piperacillin-tazobactam (ZOSYN) IVPB 3.375 g  Status:  Discontinued     3.375 g 12.5 mL/hr over 240  Minutes Intravenous 3 times per day 08/03/13 0440 08/07/13 0921   08/01/13 1245  metroNIDAZOLE (FLAGYL) IVPB 500 mg     500 mg 100 mL/hr over 60 Minutes Intravenous To Surgery 08/01/13 1238 08/01/13 1300   07/31/13 0800  cefoTEtan (CEFOTAN) 2 g in dextrose 5 % 50 mL IVPB     2 g 100 mL/hr over 30 Minutes Intravenous On call to O.R. 07/31/13 0741 08/01/13 0559   07/31/13 0800  cefTRIAXone (ROCEPHIN) 2 g in dextrose 5 % 50 mL IVPB  Status:  Discontinued    Comments:  Pharmacy may adjust dosing strength / duration / interval for maximal efficacy   2 g 100 mL/hr over 30 Minutes Intravenous Every 24 hours 07/31/13 0744 08/03/13 0405   07/29/13 2100  ciprofloxacin (CIPRO) IVPB 400 mg  Status:  Discontinued     400 mg  200 mL/hr over 60 Minutes Intravenous Every 12 hours 07/29/13 2058 07/31/13 0744   07/29/13 2100  metroNIDAZOLE (FLAGYL) IVPB 500 mg  Status:  Discontinued     500 mg 100 mL/hr over 60 Minutes Intravenous Every 8 hours 07/29/13 2058 08/03/13 0405   07/29/13 2100  fluconazole (DIFLUCAN) IVPB 200 mg  Status:  Discontinued     200 mg 100 mL/hr over 60 Minutes Intravenous Every 24 hours 07/29/13 2058 08/04/13 0911   07/29/13 1930  ciprofloxacin (CIPRO) IVPB 400 mg     400 mg 200 mL/hr over 60 Minutes Intravenous  Once 07/29/13 1915 07/29/13 2031   07/29/13 1930  metroNIDAZOLE (FLAGYL) IVPB 500 mg  Status:  Discontinued     500 mg 100 mL/hr over 60 Minutes Intravenous  Once 07/29/13 1915 07/29/13 2107       Assessment/Plan  1. POD 9, EXPLORATORY LAPAROTOMY (N/A)  PARTIAL COLECTOMY (N/A)  COLOSTOMY (N/A)  SMALL BOWEL RESECTION (N/A)  2. Postoperative ileus, resolving 3. Ventilator dependent respiratory failure  4. ARDS  Plan: 1. Continue dressing changes twice a day to abdominal wound. Her wound is not appear to be healing very well as I am able to stick fingers through her PDS sutures.we'll continue to follow this closely. 2. Her tube feeds may be advanced as she tolerates  her CCM. 3. Continue routine ostomy care. 4. Plans for a tracheostomy on Friday if fails to wean from vent.  LOS: 12 days    Italo Banton E 08/10/2013, 7:57 AM Pager: [phone]

## 2013-08-10 NOTE — Consult Note (Addendum)
WOC ostomy follow up CCS following for assessment and plan of care to abd wound. Bedside nurse states ostomy pouch has been leaking frequently and soiling abd wound.  Assessed abd and stoma with Dr Emogene Morgan at the bedside. CCS team following for assessment and plan of care to abd wound. Stoma type/location:  colostomy to left lower quad from 07/13 surgery Stomal assessment/size: 1 3/4  inch, pink, viable, above skin, less edematous Peristomal assessment: skin intact with stiches present.  [place] at 9:00 o'clock near abd wound where leakage is occurring despite barrier ring which was applied to maintain seal. Treatment options for stomal/peristomal skin: cleaned skin with moist guaze and placed barrier ring to decrease potential leakage Output : 200cc of brown liquid stool. Ostomy pouching: 1 piece with barrier ring Education provided: No family at bedside.  Pt on vent and not responsive.  Supplies ordered to bedside  for staff nurses. Will attempt use of urostomy pouch to straight drainage bag to help decrease chance of leakage occurring. Larene Beach, RN, BSN, MSN-Student Julien Girt MSN, RN, [place], [place], [place]

## 2013-08-10 NOTE — Progress Notes (Signed)
I have seen and examined the pt and agree with PA-Osborne's progress note. Bowel functioning well Adv TF as tol

## 2013-08-10 NOTE — Progress Notes (Signed)
Contacted by Radiologist to advance NG tube 10cm. Discussed with Saverio Danker PA with Surgery. Advanced with Claiborne Billings at bedside. Per Claiborne Billings D/C 120cc Air Flushes

## 2013-08-10 NOTE — Progress Notes (Signed)
ANTICOAGULATION CONSULT NOTE - Follow Up Consult  Pharmacy Consult for Heparin Indication: R axillary vein DVT  Allergies  Allergen Reactions  . Pregabalin Swelling    Tongue swelling  . Sulfonamide Derivatives Swelling    Childhood reaction    Patient Measurements: Height: 5' (152.4 cm) Weight: 175 lb 4.3 oz (79.5 kg) IBW/kg (Calculated) : 45.5  Vital Signs: Temp: 99 F (37.2 C) (07/22 1250) Temp src: Oral (07/22 1250) BP: 109/53 mmHg (07/22 1528) Pulse Rate: 109 (07/22 1528)  Labs:  Recent Labs  08/08/13 1754  08/09/13 0410 08/09/13 1400 08/09/13 2230 08/10/13 0500 08/10/13 1400  HGB  --   --  8.7*  --  7.0* 7.4*  --   HCT  --   --  27.2*  --  21.6* 23.6*  --   PLT  --   --  210  --  200 257  --   HEPARINUNFRC  --   < > 0.63  --   --  0.97* 0.56  CREATININE 0.55  --  0.51 0.49* 0.54 0.48*  --   TROPONINI <0.30  --   --   --   --   --   --   < > = values in this interval not displayed.  Estimated Creatinine Clearance: 57.6 ml/min (by C-G formula based on Cr of 0.48).   Medications:  Heparin @ 1100 units/hr  Assessment: 83 YOF who presented to the [facility] on 7/10 with LLQ pain and was found to have a bowel obstruction, s/p repair on 7/13. The patient was found to have a right axillary vein DVT on 7/17 via dopplers however treatment was held initially due to the recent surgery. It was determined that the patient's risks of getting a new clot exceeded the patient's risk for bleeding and pharmacy was consulted to start full dose heparin on 7/20. Per CCM - no boluses due to recent surgery  The heparin level this morning remains is therapeutic after a rate decrease this morning (HL 0.56 << 0.97, goal of 0.3-0.7). Hgb/Hct slight drop, plts wnl - no overt s/sx of bleeding noted.   Goal of Therapy:  Heparin level 0.3-0.7 units/ml Monitor platelets by anticoagulation protocol: Yes   Plan:  1. Continue heparin at 900 units/hr (9 ml/hr) 2. Will continue to monitor for  any signs/symptoms of bleeding and will follow up with heparin level in 8 hours to confirm therapeutic  Alycia Rossetti, PharmD, BCPS Clinical Pharmacist Pager: [phone] 08/10/2013 4:02 PM

## 2013-08-10 NOTE — Progress Notes (Signed)
ANTICOAGULATION CONSULT NOTE - Follow Up Consult  Pharmacy Consult for heparin Indication: DVT  Labs:  Recent Labs  08/08/13 1754  08/09/13 0410 08/09/13 1400 08/09/13 2230 08/10/13 0500 08/10/13 1400 08/10/13 2230  HGB  --   --  8.7*  --  7.0* 7.4*  --   --   HCT  --   --  27.2*  --  21.6* 23.6*  --   --   PLT  --   --  210  --  200 257  --   --   HEPARINUNFRC  --   < > 0.63  --   --  0.97* 0.56 0.59  CREATININE 0.55  --  0.51 0.49* 0.54 0.48*  --   --   TROPONINI <0.30  --   --   --   --   --   --   --   < > = values in this interval not displayed.   Assessment/Plan:  73yo female remains therapeutic on heparin after rate adjustment. Will continue gtt at current rate and confirm stable with am labs.   Wynona Neat, PharmD, BCPS  08/10/2013,11:06 PM

## 2013-08-10 NOTE — Progress Notes (Signed)
PULMONARY / CRITICAL CARE MEDICINE   Name: [name] MRN: [id number] DOB: 1940/08/29    ADMISSION DATE:  07/29/2013 CONSULTATION DATE:  08/01/2013  REFERRING MD :  Surgery PRIMARY SERVICE: PCCM  CHIEF COMPLAINT:  Bowel obstruction  BRIEF PATIENT DESCRIPTION: 73 y/o female with PMH CAD, HTN, Barrett's esophagus, presented to [facility] ED 7/10 with LLQ pain. Found to have bowel obstruction. To OR for repair 7/13. Remains of vent post-op. Consulted PCCM.  SIGNIFICANT EVENTS / STUDIES: 7/10 Admit, CT Abd - dilated prox colon w/ abrupt transition c/w with obstruction, diverticulitis vs colitis, Surgery c/s 7/12 Empiric Cipro/Flagyl, Cardiology consulted for pre-op evaluation, TPN per pharm 7/13 - Laparotomy, partial colectomy, colostomy, SB resection, repair of enterotomy, lysis of adhesions         - Hgb down to 6.8 >> imp to 10.8 s/p 2u PRBC 7/14 - Extubation, CXR stable, Hgb 9.5, o/n tachycardic >> Cards start Esmolol, IVF         - o/n required re-intubation due to hypoxemic resp failure, hypotension, concern for aspiration         - DC Esmolol, start Vaso, switch to Vanc / Zosyn 7/15 - CXR (worsening bilateral airspace disease, suspect edema), concern for volume overload despite CVP 5-6         - Add Vaso, Trop (neg), BNP 3782, PCT 1.83, ECHO (normal LVEF) 7/16 - Considered ARDS (unclear etiology, ?aspiration), CXR (persistent b/l edema), cont pressors, stable low PCT 1.9 7/17 - o/n Hgb 8.2 >> 6.9, received 1u PRBC, titrating down Neo, CVP 15-16         - Off Neo, remains on Vaso, CXR (mild improved b/l pulm edema) 7/17 - CT abd >no bleeding , no abscess, sm fluid in abd/pelvis  7/17 - Ven Dopp>+DVT Rt axillary vein 7/18 - off Vaso 7/19 - on intermittent Neo, CXR mild improved b/l diffuse interstitial edema, trial SBT anticipate extubate 1-2 days         - start trickle tube feeds (per surgery) 7/20 - off Neo, cont SBT, CXR (mild improved / stable diffuse interstitial edema, Hgb  improved 7.9 >> 8.2 7/21 - remains off pressors, on Precedex (minimal Fentanyl), good UOP to Lasix 60mg , Hypo K to 2.8         - Inc TF up to 20 cc/hr, wean off TPN 7/22 - o/n hypotension not responding to IVF bolus, CVP 6, start low dose Levophed         - Hgb 8.7 >> 7.0 >> 7.4, K 2.8 >> 3.1         - CXR (improved aeration, inc LLL haziness with noted rotation on film), PCT < 0.10, lactic acid 2.0, inc WBC 14.1  LINES / TUBES: ETT 7/13 >> 7/14, 7/14 >> PICC 7/12 >>7/20 R IJ CVL 7/14 >>  L radial arterial cath 7/15 >>   CULTURES: Blood x 2, 7/15 >> negative Respiratory 7/15 >> few Candida  ANTIBIOTICS: Ceftriaxone 7/10 >>> 7/14 Flagyl 7/10 >>> 7/14 Vancomycin 7/14 >>> 7/16 Fluconazole 7/10 >>> 7/16 Zosyn 7/14 >> 7/21  SUBJECTIVE:  Overnight pt noted to have episode of hypotension without significant BP response to IVF bolus, CVP measured at 6 despite overall positive fluid balance. Resumed low dose Levophed o/n. Remains on low dose Precedex for sedation, on vent. Today without agitation, responds to name, otherwise not following commands. Weaning Levo.  VITAL SIGNS: Temp:  [98.6 F (37 C)-101.2 F (38.4 C)] 98.6 F (37 C) (07/22 0439) Pulse Rate:  [  82-130] 101 (07/22 0615) Resp:  [18-38] 25 (07/22 0615) BP: (58-178)/(31-78) 95/52 mmHg (07/22 0615) SpO2:  [100 %] 100 % (07/22 0615) FiO2 (%):  [40 %-100 %] 40 % (07/22 0400) HEMODYNAMICS: CVP:  [6 mmHg-8 mmHg] 8 mmHg VENTILATOR SETTINGS: Vent Mode:  [-] PRVC FiO2 (%):  [40 %-100 %] 40 % Set Rate:  [12 bmp] 12 bmp Vt Set:  [450 mL] 450 mL PEEP:  [5 cmH20] 5 cmH20 Pressure Support:  [10 cmH20-12 cmH20] 10 cmH20 Plateau Pressure:  [18 cmH20-20 cmH20] 20 cmH20 INTAKE / OUTPUT: Intake/Output     07/21 0701 - 07/22 0700   I.V. (mL/kg) 2439 (30.7)   NG/GT 300   IV Piggyback 1200   Total Intake(mL/kg) 3939 (49.5)   Urine (mL/kg/hr) 2760 (1.4)   Stool 450 (0.2)   Total Output 3210   Net +729         PHYSICAL  EXAMINATION: General:  On vent, without agitation Neuro:  Sedated RASS -2 to -3, eye opening / responds to name, not following commands Cardiovascular:  Tachycardic, regular rhythm, no murmurs Lungs: Improved bilateral breath sounds with reduced rhonchi, no wheezing, no inc work of breathing Abdomen:  Soft, abd dressing in place. LLQ colostomy functioning (liquid green stool), improved +active BS Ext: Mild improvement in b/l LE edea +2 pitting, and slightly increased b/l UE non-pitting edema  LABS:  CBC  Recent Labs Lab 08/09/13 0410 08/09/13 2230 08/10/13 0500  WBC 13.8* 13.2* 14.1*  HGB 8.7* 7.0* 7.4*  HCT 27.2* 21.6* 23.6*  PLT 210 200 257   Coag's No results found for this basename: APTT, INR,  in the last 168 hours BMET  Recent Labs Lab 08/09/13 1400 08/09/13 2230 08/10/13 0500  NA 147 146 144  K 3.2* 2.8* 3.1*  CL 107 107 106  CO2 33* 31 29  BUN 29* 27* 26*  CREATININE 0.49* 0.54 0.48*  GLUCOSE 93 173* 234*   Electrolytes  Recent Labs Lab 08/04/13 0341  08/08/13 0500 08/08/13 1754  08/09/13 1400 08/09/13 2230 08/10/13 0500  CALCIUM 7.9*  < > 9.1 8.6  < > 7.6* 7.3* 7.5*  MG 2.0  --  2.0 1.7  --   --   --  2.1  PHOS 3.5  --  3.8  --   --   --   --  2.2*  < > = values in this interval not displayed. Sepsis Markers  Recent Labs Lab 08/04/13 0341 08/05/13 0453 08/07/13 0420 08/09/13 2230  LATICACIDVEN  --   --  0.9 2.0  PROCALCITON 1.97 1.02  --  <0.10   ABG  Recent Labs Lab 08/08/13 0941 08/09/13 2205 08/10/13 0340  PHART 7.575* 7.548* 7.497*  PCO2ART 31.6* 36.5 41.1  PO2ART 88.0 127.0* 152.0*   Liver Enzymes  Recent Labs Lab 08/04/13 0341 08/07/13 0420 08/08/13 0500  AST 33 36 43*  ALT 12 21 30   ALKPHOS 55 62 72  BILITOT 0.3 0.3 0.4  ALBUMIN 1.5* 1.7* 2.0*   Cardiac Enzymes  Recent Labs Lab 08/03/13 0930 08/08/13 1754  TROPONINI <0.30 <0.30  PROBNP 3782.0*  --    Glucose  Recent Labs Lab 08/09/13 0745  08/09/13 1239 08/09/13 1542 08/09/13 1949 08/09/13 2344 08/10/13 0350  GLUCAP 83 77 134* 157* 182* 164*    Imaging - noted   ASSESSMENT / PLAN:  PULMONARY A: Acute respiratory failure (post-op), req re-intubation 7/14 following extubation with hypoxemic failure - On vent ARDS, unclear etiology, question aspiration - Improving Ruled  out Cardiogenic Etiology of Pulm Edema - 7/15 ECHO (nml LVEF) CAP, presumed P:   Daily SBT, cpap 5 ps 10-12, goal 2 hrs, delirium still a concern on extubation CXR improved with reduced edema / improved aeration, inc LLL haziness likely d/t rotation ABG improved to pH 7.497, reduce T V by 1 cc/kg If unable to wean and remains vent dependent, anticipate placing trach on Friday 7/24, bedside perc  CARDIOVASCULAR A:  Shock, consistent with Septic etiology, presumed CAP source - Resumed pressors with Levo, hypovolemia, precedex? Hemorrhagic Shock - Resolved Tachycardia H/o CAD, HTN Elevated BNP, but Preserved LVEF - No evidence cardiogenic shock - 7/15 ECHO (nml EF) DVT Rt axillary vein - RUE PICC removed 7/20 P:  Monitor BP, MAP >60 Titrate down Levophed (low dose), anticipate off pressors today, avoiding precedex / propofol Resume stress dose steroids - hydrocortisone 50mg  q 6 hr Check CVP, last 11, no bolus Goal even Hold BB , pressors Mild inc lactate to 2.0, previously 0.9, continue trend, see ID Hold Lasix diuresis given hypotension Hold plavix, lipitor, ASA  RENAL A:   Non-gap Metabolic Acidosis - Resolved Hypokalemia - 3.1 Hypernatremia - Resolved. 144 P:   Replace K per protocol BMET in AM and pm change fluids to 1/2 NS @ 50 cc/hr Hold Lasix, levophed  GASTROINTESTINAL A:   Suspected colitis with colonic obstruction/SBO s/p partial colectomy with new colostomy 7/13 Ileus, post-op Hx of Barrett's esophagus Nutrition P:   Post op care Nutrition per surgery -  increase to goal Wound/ostomy care consult Continue PPI    HEMATOLOGIC A:   Acute blood loss anemia, post-op - Acute dec Hgb 8.7 >> 7.0, now improving to 7.4 with likely dilutional component DVT Rt axillary vein  RUE PICC removed 7/20 VTE ppx P:  SCD's CBC daily Transfuse for Hb < 7 or bleeding Continue Heparin per pharm for acute RUE DVT, as risk PE / DVT higher than risk bleeding currently  INFECTIOUS A:   Colitis, s/p partial colectomy Presumed CAP, question aspiration PNA - Mild inc WBC to 14, PCT < 0.10 P:   - Continue monitor off antibiotics, negative PCT - Cultures negative as above -re assess lactic acid in am, follow serial exams abdo / wound -unlikely shock state related to infection, see cvs  ENDOCRINE A: Septic Shock, requiring stress dose steroids - Resolved, completed steroids, now HTN Hyperglycemia - improved No hx DM P:   Resistant SSI Re add stress roids  NEUROLOGIC A:   Acute encephalopathy - Continued poor mental status Sedated on Mechanical Vent Delirium  P:   RASS goal: 0 Dc precedex, done over night PT unable Continued Risperdal 0.5 bid WUA, behaviroal modifiers PT when able   GLOBAL: Resumed pressors with low dose Levophed d/t hypotension, anticipate wean off pressors today, cont vent support for ARDS with continue daily SBT,, negative PCT plan to hold abx and clear other reasons for levo low dose. Surgery assisting with nutrition, cont inc TF to 20 cc, off TPN. If poor progress on vent, anticipate may need trach placement on Friday 7/24    Nobie Putnam, [place], PGY-2  08/10/2013, 6:33 AM  Ccm time 35 min   I have fully examined this patient and agree with above findings.     Lavon Paganini. Titus Mould, MD, [place] Pgr: [facility]

## 2013-08-11 ENCOUNTER — Inpatient Hospital Stay (HOSPITAL_COMMUNITY): Payer: Medicare HMO

## 2013-08-11 LAB — GLUCOSE, CAPILLARY
GLUCOSE-CAPILLARY: 220 mg/dL — AB (ref 70–99)
GLUCOSE-CAPILLARY: 145 mg/dL — AB (ref 70–99)
GLUCOSE-CAPILLARY: 173 mg/dL — AB (ref 70–99)
GLUCOSE-CAPILLARY: 174 mg/dL — AB (ref 70–99)
Glucose-Capillary: 154 mg/dL — ABNORMAL HIGH (ref 70–99)
Glucose-Capillary: 170 mg/dL — ABNORMAL HIGH (ref 70–99)
Glucose-Capillary: 205 mg/dL — ABNORMAL HIGH (ref 70–99)

## 2013-08-11 LAB — BASIC METABOLIC PANEL
Anion gap: 11 (ref 5–15)
BUN: 28 mg/dL — AB (ref 6–23)
CHLORIDE: 108 meq/L (ref 96–112)
CO2: 28 mEq/L (ref 19–32)
Calcium: 7.9 mg/dL — ABNORMAL LOW (ref 8.4–10.5)
Creatinine, Ser: 0.38 mg/dL — ABNORMAL LOW (ref 0.50–1.10)
GFR calc Af Amer: >90 mL/min (ref >90)
GFR calc non Af Amer: >90 mL/min (ref >90)
GLUCOSE: 219 mg/dL — AB (ref 70–99)
POTASSIUM: 3.6 meq/L — AB (ref 3.7–5.3)
Sodium: 147 mEq/L (ref 137–147)

## 2013-08-11 LAB — LACTIC ACID, PLASMA: LACTIC ACID, VENOUS: 1.7 mmol/L (ref 0.5–2.2)

## 2013-08-11 LAB — POCT I-STAT 3, ART BLOOD GAS (G3+)
Acid-Base Excess: 6 mmol/L — ABNORMAL HIGH (ref 0.0–2.0)
Acid-Base Excess: 4 mmol/L — ABNORMAL HIGH (ref 0.0–2.0)
Bicarbonate: 28.7 mEq/L — ABNORMAL HIGH (ref 20.0–24.0)
Bicarbonate: 28 mEq/L — ABNORMAL HIGH (ref 20.0–24.0)
O2 SAT: 92 %
O2 Saturation: 90 %
PCO2 ART: 32.1 mmHg — AB (ref 35.0–45.0)
PH ART: 7.557 — AB (ref 7.350–7.450)
PO2 ART: 52 mmHg — AB (ref 80.0–100.0)
PO2 ART: 53 mmHg — AB (ref 80.0–100.0)
Patient temperature: 97.4
TCO2: 30 mmol/L (ref 0–100)
TCO2: 29 mmol/L (ref 0–100)
pCO2 arterial: 36.1 mmHg (ref 35.0–45.0)
pH, Arterial: 7.497 — ABNORMAL HIGH (ref 7.350–7.450)

## 2013-08-11 LAB — HEPARIN LEVEL (UNFRACTIONATED): Heparin Unfractionated: 0.59 IU/mL (ref 0.30–0.70)

## 2013-08-11 LAB — CBC
HCT: 22.5 % — ABNORMAL LOW (ref 36.0–46.0)
HEMOGLOBIN: 7.2 g/dL — AB (ref 12.0–15.0)
MCH: 29.3 pg (ref 26.0–34.0)
MCHC: 32 g/dL (ref 30.0–36.0)
MCV: 91.5 fL (ref 78.0–100.0)
Platelets: 277 10*3/uL (ref 150–400)
RBC: 2.46 MIL/uL — AB (ref 3.87–5.11)
RDW: 17.7 % — ABNORMAL HIGH (ref 11.5–15.5)
WBC: 17.5 10*3/uL — ABNORMAL HIGH (ref 4.0–10.5)

## 2013-08-11 LAB — APTT: aPTT: 64 seconds — ABNORMAL HIGH (ref 24–37)

## 2013-08-11 LAB — PROTIME-INR
INR: 1.2 (ref 0.00–1.49)
Prothrombin Time: 15.2 seconds (ref 11.6–15.2)

## 2013-08-11 LAB — PROCALCITONIN: Procalcitonin: <0.1 ng/mL

## 2013-08-11 MED ORDER — VANCOMYCIN HCL IN DEXTROSE 750-5 MG/150ML-% IV SOLN
750.0000 mg | Freq: Two times a day (BID) | INTRAVENOUS | Status: DC
  Administered 2013-08-11 – 2013-08-13 (×4): 750 mg via INTRAVENOUS
  Filled 2013-08-11 (×6): qty 150

## 2013-08-11 MED ORDER — POTASSIUM CHLORIDE 20 MEQ/15ML (10%) PO LIQD
30.0000 meq | ORAL | Status: AC
  Administered 2013-08-11: 40 meq
  Administered 2013-08-11: 30 meq
  Filled 2013-08-11 (×2): qty 22.5

## 2013-08-11 MED ORDER — MIDAZOLAM HCL 2 MG/2ML IJ SOLN
2.0000 mg | INTRAMUSCULAR | Status: DC | PRN
  Administered 2013-08-11 (×2): 4 mg via INTRAVENOUS
  Administered 2013-08-12 – 2013-08-13 (×4): 2 mg via INTRAVENOUS
  Filled 2013-08-11 (×2): qty 2
  Filled 2013-08-11: qty 4
  Filled 2013-08-11 (×2): qty 2
  Filled 2013-08-11: qty 4

## 2013-08-11 MED ORDER — FENTANYL CITRATE 0.05 MG/ML IJ SOLN
100.0000 ug | INTRAMUSCULAR | Status: DC | PRN
  Administered 2013-08-11 – 2013-08-12 (×3): 100 ug via INTRAVENOUS
  Filled 2013-08-11 (×2): qty 2

## 2013-08-11 MED ORDER — FENTANYL CITRATE 0.05 MG/ML IJ SOLN
100.0000 ug | Freq: Once | INTRAMUSCULAR | Status: AC
  Administered 2013-08-11: 100 ug via INTRAVENOUS

## 2013-08-11 MED ORDER — RISPERIDONE 1 MG/ML PO SOLN
0.5000 mg | Freq: Every day | ORAL | Status: DC
  Administered 2013-08-12 – 2013-08-14 (×3): 0.5 mg
  Filled 2013-08-11 (×4): qty 0.5

## 2013-08-11 MED ORDER — SODIUM CHLORIDE 0.9 % IV SOLN
500.0000 mg | Freq: Three times a day (TID) | INTRAVENOUS | Status: DC
  Administered 2013-08-11 – 2013-08-17 (×18): 500 mg via INTRAVENOUS
  Filled 2013-08-11 (×20): qty 500

## 2013-08-11 MED ORDER — SODIUM CHLORIDE 0.9 % IV SOLN
0.0000 ug/h | INTRAVENOUS | Status: DC
  Administered 2013-08-11: 100 ug/h via INTRAVENOUS
  Administered 2013-08-11 – 2013-08-12 (×2): 300 ug/h via INTRAVENOUS
  Administered 2013-08-12: 200 ug/h via INTRAVENOUS
  Administered 2013-08-13: 250 ug/h via INTRAVENOUS
  Administered 2013-08-13: 100 ug/h via INTRAVENOUS
  Filled 2013-08-11 (×6): qty 50

## 2013-08-11 MED ORDER — FENTANYL BOLUS VIA INFUSION
25.0000 ug | INTRAVENOUS | Status: DC | PRN
  Administered 2013-08-12 (×4): 50 ug via INTRAVENOUS
  Filled 2013-08-11: qty 50

## 2013-08-11 MED ORDER — FENTANYL CITRATE 0.05 MG/ML IJ SOLN
INTRAMUSCULAR | Status: AC
  Filled 2013-08-11: qty 2

## 2013-08-11 MED ORDER — IOHEXOL 300 MG/ML  SOLN
25.0000 mL | INTRAMUSCULAR | Status: AC
  Administered 2013-08-11 (×2): 25 mL via ORAL

## 2013-08-11 MED ORDER — SODIUM CHLORIDE 0.9 % IV BOLUS (SEPSIS)
500.0000 mL | Freq: Once | INTRAVENOUS | Status: AC
  Administered 2013-08-11: 500 mL via INTRAVENOUS

## 2013-08-11 MED ORDER — FENTANYL CITRATE 0.05 MG/ML IJ SOLN: 50.0000 ug | Freq: Once | INTRAMUSCULAR | Status: DC

## 2013-08-11 MED ORDER — DEXTROSE 5 % IV SOLN
INTRAVENOUS | Status: DC
  Administered 2013-08-11 – 2013-08-12 (×2): via INTRAVENOUS

## 2013-08-11 MED ORDER — IOHEXOL 300 MG/ML  SOLN
100.0000 mL | Freq: Once | INTRAMUSCULAR | Status: AC | PRN
Start: 2013-08-11 — End: 2013-08-11
  Administered 2013-08-11: 80 mL via INTRAVENOUS

## 2013-08-11 MED ORDER — PROPOFOL 10 MG/ML IV EMUL: 5.0000 ug/kg/min | Freq: Once | INTRAVENOUS | Status: DC

## 2013-08-11 MED ORDER — VECURONIUM BROMIDE 10 MG IV SOLR
10.0000 mg | Freq: Once | INTRAVENOUS | Status: AC
  Administered 2013-08-12: 5 mg via INTRAVENOUS
  Filled 2013-08-11 (×2): qty 10

## 2013-08-11 MED ORDER — ETOMIDATE 2 MG/ML IV SOLN
40.0000 mg | Freq: Once | INTRAVENOUS | Status: AC
  Administered 2013-08-12: 10 mg via INTRAVENOUS
  Filled 2013-08-11 (×2): qty 20

## 2013-08-11 MED ORDER — SODIUM CHLORIDE 0.9 % IV SOLN
100.0000 mg | Freq: Every day | INTRAVENOUS | Status: DC
  Administered 2013-08-11 – 2013-08-12 (×2): 100 mg via INTRAVENOUS
  Filled 2013-08-11 (×2): qty 100

## 2013-08-11 MED ORDER — FENTANYL CITRATE 0.05 MG/ML IJ SOLN: 200.0000 ug | Freq: Once | INTRAMUSCULAR | Status: DC

## 2013-08-11 MED ORDER — MIDAZOLAM HCL 2 MG/2ML IJ SOLN
4.0000 mg | Freq: Once | INTRAMUSCULAR | Status: AC
  Administered 2013-08-12: 4 mg via INTRAVENOUS
  Filled 2013-08-11: qty 4

## 2013-08-11 NOTE — Consult Note (Addendum)
WOC ostomy follow up CCS following for assessment and plan of care to abd wound. Stomal assessment/size: 1 3/4  inch, pink, viable, above skin, less edematous Peristomal assessment: skin intact surrounding stoma.  [place] at 9:00 o'clock near abd wound  Output : 100cc of brown liquid stool. Ostomy pouching: 1 piece urostomy pouch with barrier ring to facilitate drainage and decrease chance of leakage.  Supplies at bedside for bedside nurses. Education provided: No family at bedside. Pt on vent and not responsive. Supplies ordered to bedside for staff nurses. Julien Girt MSN, RN, [place], [place], [place]

## 2013-08-11 NOTE — Progress Notes (Signed)
Orthopedic Tech Progress Note Patient Details:  [name] 1940-11-29 [id number] Brace completed by Advanced vendor. Patient ID: [name], female   DOB: [birth year], 73 y.o.   MRN: [id number]   Braulio Bosch 08/11/2013, 4:27 PM

## 2013-08-11 NOTE — Progress Notes (Signed)
eLink Physician-Brief Progress Note Patient Name: [name] DOB: 1940/10/10 MRN: [id number]  Date of Service  08/11/2013   HPI/Events of Note   ICH on CT scan, small, left frontal  eICU Interventions  Stop heparin   Intervention Category Intermediate Interventions: Communication with other healthcare providers and/or family  Simonne Maffucci 08/11/2013, 6:04 PM

## 2013-08-11 NOTE — Progress Notes (Signed)
UR Completed.  [name] [phone] 08/11/2013  

## 2013-08-11 NOTE — Progress Notes (Signed)
ANTICOAGULATION - Follow Up & ANTBIOTIC - Initial CONSULT NOTE  Pharmacy Consult for Heparin & Micafungin + Vancomycin + Primaxin Indication: R axillary vein DVT & Empiric sepsis coverage  Allergies  Allergen Reactions  . Pregabalin Swelling    Tongue swelling  . Sulfonamide Derivatives Swelling    Childhood reaction    Patient Measurements: Height: 5' (152.4 cm) Weight: 166 lb 7.2 oz (75.5 kg) IBW/kg (Calculated) : 45.5  Vital Signs: Temp: 97.4 F (36.3 C) (07/23 0808) Temp src: Oral (07/23 0808) BP: 121/43 mmHg (07/23 0700) Pulse Rate: 103 (07/23 1127)  Labs:  Recent Labs  08/08/13 1754  08/09/13 2230 08/10/13 0500 08/10/13 1400 08/10/13 2230 08/11/13 0500  HGB  --   < > 7.0* 7.4*  --   --  7.2*  HCT  --   < > 21.6* 23.6*  --   --  22.5*  PLT  --   < > 200 257  --   --  277  HEPARINUNFRC  --   < >  --  0.97* 0.56 0.59 0.59  CREATININE 0.55  < > 0.54 0.48*  --   --  0.38*  TROPONINI <0.30  --   --   --   --   --   --   < > = values in this interval not displayed.  Estimated Creatinine Clearance: 56 ml/min (by C-G formula based on Cr of 0.38).   Medications:  Heparin @ 900 units/hr  Assessment: 42 YOF who presented to the [facility] on 7/10 with LLQ pain and was found to have a bowel obstruction, s/p repair on 7/13. The patient was found to have a right axillary vein DVT on 7/17 via dopplers however treatment was held initially due to the recent surgery. It was determined that the patient's risks of getting a new clot exceeded the patient's risk for bleeding and pharmacy was consulted to start full dose heparin on 7/20.   The heparin level this morning remains (HL 0.59 << 0.59, goal of 0.3-0.7). Hgb/Hct low but stable, plts wnl - no overt s/sx of bleeding noted.   The patient is also noted to remain hypotensive despite fluid boluses and starting pressors on 7/22. CCM concerned for possible sepsis with recent abdominal surgery and consulted pharmacy to dose Vancomycin +  Primaxin + Micafungin. Repeat BCx sent on 7/23. SCr 0.38 (poor muscle mass), estimated CrCl>50 ml/min.   Goal of Therapy:  Heparin level 0.3-0.7 units/ml Monitor platelets by anticoagulation protocol: Yes   Plan:  1. Continue heparin at 900 units/hr (9 ml/hr) 2. Start Vancomycin 750 mg IV every 12 hours 3. Start Primaxin 500 mg IV every 8 hours 4. Start Micafungin 100 mg IV every 24 hours 5. Will continue to monitor for any signs/symptoms of bleeding and will follow up with heparin level in the a.m.  6. Will continue to follow renal function, culture results, LOT, and antibiotic de-escalation plans   Alycia Rossetti, PharmD, BCPS Clinical Pharmacist Pager: [phone] 08/11/2013 11:57 AM

## 2013-08-11 NOTE — Progress Notes (Signed)
Inpatient Diabetes Program Recommendations  AACE/ADA: New Consensus Statement on Inpatient Glycemic Control (2013)  Target Ranges:  Prepandial:   less than 140 mg/dL      Peak postprandial:   less than 180 mg/dL (1-2 hours)      Critically ill patients:  140 - 180 mg/dL   Results for [name], [name] (MRN [id number]) as of 08/11/2013 11:13  Ref. Range 08/10/2013 07:54 08/10/2013 12:35 08/10/2013 15:54 08/10/2013 19:14 08/11/2013 00:06 08/11/2013 04:20 08/11/2013 08:10 08/11/2013 10:13  Glucose-Capillary Latest Range: 70-99 mg/dL 151 (H) 136 (H) 167 (H) 169 (H) 205 (H) 220 (H) 154 (H) 170 (H)   Diabetes history: No Outpatient Diabetes medications: NA Current orders for Inpatient glycemic control: Novolog 0-20 units Q4H  Inpatient Diabetes Program Recommendations Insulin - Basal: If steroids will be continued and CBG consistently elevated , may want to consider ordering low dose basal insulin.  Thanks, Barnie Alderman, RN, MSN, CCRN Diabetes Coordinator Inpatient Diabetes Program [phone] (Team Pager) [phone] (AP office) [phone] [facility] office)

## 2013-08-11 NOTE — Progress Notes (Signed)
Contacted by radiologist, Enteric tube has been retracted and now lies in the upper thoracic esophagus. Inserted 28cm and KUB ordered for placement verification. Discussed with Saverio Danker at bedside

## 2013-08-11 NOTE — Progress Notes (Signed)
Patient ID: [name], female   DOB: [birth year], 73 y.o.   MRN: [id number] 10 Days Post-Op  Subjective: RN reports some respiratory issues overnight.  Otherwise ok.  NGT continues to migrate up.  Tolerating TFs, now at 50cc/hr, which is goal.  Objective: Vital signs in last 24 hours: Temp:  [98.1 F (36.7 C)-99.7 F (37.6 C)] 98.1 F (36.7 C) (07/23 0400) Pulse Rate:  [94-117] 96 (07/23 0700) Resp:  [19-41] 34 (07/23 0700) BP: (91-152)/(32-123) 121/43 mmHg (07/23 0700) SpO2:  [97 %-100 %] 100 % (07/23 0700) FiO2 (%):  [40 %] 40 % (07/23 0700) Weight:  [166 lb 7.2 oz (75.5 kg)] 166 lb 7.2 oz (75.5 kg) (07/23 0200) Last BM Date: 08/10/13  Intake/Output from previous day: 07/22 0701 - 07/23 0700 In: 3039.4 [I.V.:2035.1; NG/GT:884.3] Out: 1680 [Urine:1080; Stool:600] Intake/Output this shift:    PE: Abd: soft, ND, +BS. Wound actually looks better today, starting to heal a little better.  Cleaner, packed.  Ostomy with good output.  Stoma pink and healthy.  Lab Results:   Recent Labs  08/10/13 0500 08/11/13 0500  WBC 14.1* 17.5*  HGB 7.4* 7.2*  HCT 23.6* 22.5*  PLT 257 277   BMET  Recent Labs  08/10/13 0500 08/11/13 0500  NA 144 147  K 3.1* 3.6*  CL 106 108  CO2 29 28  GLUCOSE 234* 219*  BUN 26* 28*  CREATININE 0.48* 0.38*  CALCIUM 7.5* 7.9*   PT/INR No results found for this basename: LABPROT, INR,  in the last 72 hours CMP     Component Value Date/Time   NA 147 08/11/2013 0500   K 3.6* 08/11/2013 0500   CL 108 08/11/2013 0500   CO2 28 08/11/2013 0500   GLUCOSE 219* 08/11/2013 0500   BUN 28* 08/11/2013 0500   CREATININE 0.38* 08/11/2013 0500   CALCIUM 7.9* 08/11/2013 0500   PROT 5.0* 08/08/2013 0500   ALBUMIN 2.0* 08/08/2013 0500   AST 43* 08/08/2013 0500   ALT 30 08/08/2013 0500   ALKPHOS 72 08/08/2013 0500   BILITOT 0.4 08/08/2013 0500   GFRNONAA >90 08/11/2013 0500   GFRAA >90 08/11/2013 0500   Lipase     Component Value Date/Time   LIPASE 9*  07/29/2013 1605       Studies/Results: Dg Chest Port 1 View  08/11/2013   CLINICAL DATA:  Endotracheal tube.  Ventilated patient.  EXAM: PORTABLE CHEST - 1 VIEW  COMPARISON:  08/10/2013.  FINDINGS: Support apparatus: Endotracheal tube tip 2 cm from the carina. RIGHT IJ central line unchanged. The enteric tube appears to have been pulled back into the upper thoracic esophagus, with the distal aspect of the tip overlying the endotracheal tube.  Cardiomediastinal silhouette: Unchanged. Aortic arch atherosclerosis.  Lungs: Worsening bilateral airspace disease with perihilar and basilar predominant distribution. The appearance is compatible with pulmonary edema. No pneumothorax. Basilar atelectasis.  Effusions:  Small LEFT-greater-than-RIGHT effusions.  Other:  None.  IMPRESSION: 1. Unchanged endotracheal tube. 2. Enteric tube has been retracted and now lies in the upper thoracic esophagus. This tube should be repositioned. Critical Value/emergent results were called by telephone at the time of interpretation on 08/11/2013 at 7:30 am to the patient's nurse Mavis, who verbally acknowledged these results. 3. Worsening bilateral basilar and perihilar airspace disease compatible with pulmonary edema.   Electronically Signed   By: Dereck Ligas M.D.   On: 08/11/2013 07:32   Dg Chest Port 1 View  08/10/2013   CLINICAL DATA:  Ventilated patient  status post partial colectomy and colostomy creation  EXAM: PORTABLE CHEST - 1 VIEW  COMPARISON:  Portable chest x-ray of August 09, 2013  FINDINGS: The lungs are well-expanded. There remains increased retrocardiac density on the left with obscuration of the hemidiaphragm. The cardiac silhouette is top-normal in size. The pulmonary vascularity is normal today. The interstitial markings have improved considerably.  The endotracheal tube tip lies approximately 2.4 cm above the crotch of the carina. The esophagogastric tube tip lies in the gastric cardia with the proximal port at  the level of the GE junction. The right internal jugular venous catheter tip lies mid SVC.  IMPRESSION: 1. Improving pulmonary interstitial edema. Persistent left lower lobe atelectasis and small pleural effusion. 2. The endotracheal tube and internal jugular catheter are in appropriate position. The esophagogastric tube should be advanced 5 5-10 cm to assure that the proximal port remains below the GE junction. These results will be called to the ordering clinician or representative by the Radiologist Assistant, and communication documented in the PACS or zVision Dashboard.   Electronically Signed   By: David  Martinique   On: 08/10/2013 07:26   Dg Chest Port 1 View  08/09/2013   CLINICAL DATA:  Dyspnea.  Abnormal breath sounds.  Short of breath.  EXAM: PORTABLE CHEST - 1 VIEW  COMPARISON:  08/09/2013.  FINDINGS: Cardiopericardial silhouette within normal limits for projection. Bilateral pleural effusions are present which appears small. Diffuse bilateral basilar predominant airspace disease is present compatible with moderate CHF.  Endotracheal tube tip is 3 cm from the carina. Enteric tube is present with the tip in the stomach. The proximal side port is at the gastroesophageal junction and should be advanced about 5 cm.  RIGHT IJ central line is present with the tip in the mid SVC. No pneumothorax.  IMPRESSION: 1. Unchanged moderate CHF. 2. Support apparatus detailed above. Enteric tube should be advanced about 5 cm to prevent gastroesophageal reflux.   Electronically Signed   By: Dereck Ligas M.D.   On: 08/09/2013 15:46    Anti-infectives: Anti-infectives   Start     Dose/Rate Route Frequency Ordered Stop   08/07/13 1400  piperacillin-tazobactam (ZOSYN) IVPB 3.375 g     3.375 g 12.5 mL/hr over 240 Minutes Intravenous 3 times per day 08/07/13 0921 08/10/13 0101   08/03/13 1800  vancomycin (VANCOCIN) IVPB 750 mg/150 ml premix  Status:  Discontinued     750 mg 150 mL/hr over 60 Minutes Intravenous  Every 12 hours 08/03/13 0440 08/04/13 0910   08/03/13 0500  vancomycin (VANCOCIN) 1,250 mg in sodium chloride 0.9 % 250 mL IVPB     1,250 mg 166.7 mL/hr over 90 Minutes Intravenous  Once 08/03/13 0440 08/03/13 0646   08/03/13 0500  piperacillin-tazobactam (ZOSYN) IVPB 3.375 g  Status:  Discontinued     3.375 g 12.5 mL/hr over 240 Minutes Intravenous 3 times per day 08/03/13 0440 08/07/13 0921   08/01/13 1245  metroNIDAZOLE (FLAGYL) IVPB 500 mg     500 mg 100 mL/hr over 60 Minutes Intravenous To Surgery 08/01/13 1238 08/01/13 1300   07/31/13 0800  cefoTEtan (CEFOTAN) 2 g in dextrose 5 % 50 mL IVPB     2 g 100 mL/hr over 30 Minutes Intravenous On call to O.R. 07/31/13 0741 08/01/13 0559   07/31/13 0800  cefTRIAXone (ROCEPHIN) 2 g in dextrose 5 % 50 mL IVPB  Status:  Discontinued    Comments:  Pharmacy may adjust dosing strength / duration / interval for  maximal efficacy   2 g 100 mL/hr over 30 Minutes Intravenous Every 24 hours 07/31/13 0744 08/03/13 0405   07/29/13 2100  ciprofloxacin (CIPRO) IVPB 400 mg  Status:  Discontinued     400 mg 200 mL/hr over 60 Minutes Intravenous Every 12 hours 07/29/13 2058 07/31/13 0744   07/29/13 2100  metroNIDAZOLE (FLAGYL) IVPB 500 mg  Status:  Discontinued     500 mg 100 mL/hr over 60 Minutes Intravenous Every 8 hours 07/29/13 2058 08/03/13 0405   07/29/13 2100  fluconazole (DIFLUCAN) IVPB 200 mg  Status:  Discontinued     200 mg 100 mL/hr over 60 Minutes Intravenous Every 24 hours 07/29/13 2058 08/04/13 0911   07/29/13 1930  ciprofloxacin (CIPRO) IVPB 400 mg     400 mg 200 mL/hr over 60 Minutes Intravenous  Once 07/29/13 1915 07/29/13 2031   07/29/13 1930  metroNIDAZOLE (FLAGYL) IVPB 500 mg  Status:  Discontinued     500 mg 100 mL/hr over 60 Minutes Intravenous  Once 07/29/13 1915 07/29/13 2107       Assessment/Plan   1. POD 10, EXPLORATORY LAPAROTOMY (N/A)  PARTIAL COLECTOMY (N/A)  COLOSTOMY (N/A)  SMALL BOWEL RESECTION (N/A)  2.  Postoperative ileus, resolved 3. Ventilator dependent respiratory failure  4. ARDS 5. Leukocytosis  Plan: 1. Pt surgically doing well.  Tolerating TFs well. 2. Plan for trach tomorrow 3. WBC trending up.  Would be a little concerned about possible aspiration given NGT keeps migrating up into esophagus.  This has been replaced.  I asked the nurse to tape it to her nose, instead of the other method they were using, in order to better secure this in place.   LOS: 13 days    Michell Kader E 08/11/2013, 7:42 AM Pager: [phone]

## 2013-08-11 NOTE — Progress Notes (Signed)
PULMONARY / CRITICAL CARE MEDICINE   Name: [name] MRN: [id number] DOB: 06-28-1940    ADMISSION DATE:  07/29/2013 CONSULTATION DATE:  08/01/2013  REFERRING MD :  Surgery PRIMARY SERVICE: PCCM  CHIEF COMPLAINT:  Bowel obstruction  BRIEF PATIENT DESCRIPTION: 73 y/o female with PMH CAD, HTN, Barrett's esophagus, presented to [facility] ED 7/10 with LLQ pain. Found to have bowel obstruction. To OR for repair 7/13. Remains of vent post-op. Consulted PCCM.  SIGNIFICANT EVENTS / STUDIES: 7/10 Admit, CT Abd - dilated prox colon w/ abrupt transition c/w with obstruction, diverticulitis vs colitis, Surgery c/s 7/12 Empiric Cipro/Flagyl, Cardiology consulted for pre-op evaluation, TPN per pharm 7/13 - Laparotomy, partial colectomy, colostomy, SB resection, repair of enterotomy, lysis of adhesions         - Hgb down to 6.8 >> imp to 10.8 s/p 2u PRBC 7/14 - Extubation, CXR stable, Hgb 9.5, o/n tachycardic >> Cards start Esmolol, IVF         - o/n required re-intubation due to hypoxemic resp failure, hypotension, concern for aspiration         - DC Esmolol, start Vaso, switch to Vanc / Zosyn 7/15 - CXR (worsening bilateral airspace disease, suspect edema), concern for volume overload despite CVP 5-6         - Add Vaso, Trop (neg), BNP 3782, PCT 1.83, ECHO (normal LVEF) 7/16 - Considered ARDS (unclear etiology, ?aspiration), CXR (persistent b/l edema), cont pressors, stable low PCT 1.9 7/17 - o/n Hgb 8.2 >> 6.9, received 1u PRBC, titrating down Neo, CVP 15-16         - Off Neo, remains on Vaso, CXR (mild improved b/l pulm edema) 7/17 - CT abd >no bleeding , no abscess, sm fluid in abd/pelvis  7/17 - Ven Dopp>+DVT Rt axillary vein 7/18 - off Vaso 7/19 - on intermittent Neo, CXR mild improved b/l diffuse interstitial edema, trial SBT anticipate extubate 1-2 days         - start trickle tube feeds (per surgery) 7/20 - off Neo, cont SBT, CXR (mild improved / stable diffuse interstitial edema, Hgb  improved 7.9 >> 8.2 7/21 - remains off pressors, on Precedex (minimal Fentanyl), good UOP to Lasix 26m, Hypo K to 2.8         - Inc TF up to 20 cc/hr, wean off TPN 7/22 - o/n hypotension not responding to IVF bolus, CVP 6, start low dose Levophed         - Hgb 8.7 >> 7.0 >> 7.4, K 2.8 >> 3.1         - CXR (improved aeration, inc LLL haziness with noted rotation on film), PCT < 0.10, lactic acid 2.0, WBC 14.1 7/23 - CXR (inc bilateral patchy edema, LLL opacity), improved lactic acid 1.7, inc WBC 17.5         - NGT in esophagus >> replaced, now back in duodenum KUB confirmed   LINES / TUBES: ETT 7/13 >> 7/14, 7/14 >> NGT PICC 7/12 >>7/20 R IJ CVL 7/14 >>  L radial arterial cath 7/15 >>   CULTURES: Blood x 2, 7/15 >> negative Respiratory 7/15 >> few Candida  ANTIBIOTICS: Ceftriaxone 7/10 >>> 7/14 Flagyl 7/10 >>> 7/14 Vancomycin 7/14 >>> 7/16 Fluconazole 7/10 >>> 7/16 Zosyn 7/14 >> 7/21  SUBJECTIVE:  Per RN / RT, overnight patient with persistent tachypnea (RR 30-40s), suctioned pink frothy secretions, required higher dose pressors with Levo. Decreased UOP to 1L. Required inc sedation with Fentanyl PRN and Precedex increased.  Opens eyes to name otherwise unable to follow commands.  VITAL SIGNS: Temp:  [98.1 F (36.7 C)-99.7 F (37.6 C)] 98.1 F (36.7 C) (07/23 0400) Pulse Rate:  [94-117] 94 (07/23 0600) Resp:  [19-41] 37 (07/23 0600) BP: (91-152)/(32-123) 122/32 mmHg (07/23 0600) SpO2:  [97 %-100 %] 100 % (07/23 0600) FiO2 (%):  [40 %] 40 % (07/23 0600) Weight:  [166 lb 7.2 oz (75.5 kg)] 166 lb 7.2 oz (75.5 kg) (07/23 0200) HEMODYNAMICS: CVP:  [1 mmHg-11 mmHg] 11 mmHg VENTILATOR SETTINGS: Vent Mode:  [-] PRVC FiO2 (%):  [40 %] 40 % Set Rate:  [12 bmp] 12 bmp Vt Set:  [380 mL] 380 mL PEEP:  [5 cmH20] 5 cmH20 Plateau Pressure:  [11 cmH20-19 cmH20] 19 cmH20 INTAKE / OUTPUT: Intake/Output     07/22 0701 - 07/23 0700 07/23 0701 - 07/24 0700   I.V. (mL/kg) 1943.2 (25.7)     Other 120    NG/GT 834.3    IV Piggyback     Total Intake(mL/kg) 2897.5 (38.4)    Urine (mL/kg/hr) 1080 (0.6)    Stool 600 (0.3)    Total Output 1680     Net +1217.5            PHYSICAL EXAMINATION: General:  On vent, without agitation Neuro:  Sedated RASS -2 to -3, eye opening / responds to name, not following commands Cardiovascular: Tachycardic, regular rhythm, no murmurs Lungs: Worsening bilateral breath sounds with inc bilateral crackles. Abdomen:  Soft, abd dressing in place. LLQ colostomy functioning (liquid stool), improved +active BS Ext: Mild improvement in b/l LE edea +2 pitting, and slightly increased b/l UE non-pitting edema  LABS:  CBC  Recent Labs Lab 08/09/13 2230 08/10/13 0500 08/11/13 0500  WBC 13.2* 14.1* 17.5*  HGB 7.0* 7.4* 7.2*  HCT 21.6* 23.6* 22.5*  PLT 200 257 277   Coag's No results found for this basename: APTT, INR,  in the last 168 hours BMET  Recent Labs Lab 08/09/13 2230 08/10/13 0500 08/11/13 0500  NA 146 144 147  K 2.8* 3.1* 3.6*  CL 107 106 108  CO2 _0 BUN 27* 26* 28*  CREATININE 0.54 0.48* 0.38*  GLUCOSE 173* 234* 219*   Electrolytes  Recent Labs Lab 08/08/13 0500 08/08/13 1754  08/09/13 2230 08/10/13 0500 08/11/13 0500  CALCIUM 9.1 8.6  < > 7.3* 7.5* 7.9*  MG 2.0 1.7  --   --  2.1  --   PHOS 3.8  --   --   --  2.2*  --   < > = values in this interval not displayed. Sepsis Markers  Recent Labs Lab 08/07/13 0420 08/09/13 2230 08/10/13 0500 08/11/13 0500  LATICACIDVEN 0.9 2.0  --  1.7  PROCALCITON  --  <0.10 <0.10 <0.10   ABG  Recent Labs Lab 08/08/13 0941 08/09/13 2205 08/10/13 0340  PHART 7.575* 7.548* 7.497*  PCO2ART 31.6* 36.5 41.1  PO2ART 88.0 127.0* 152.0*   Liver Enzymes  Recent Labs Lab 08/07/13 0420 08/08/13 0500  AST 36 43*  ALT 21 30  ALKPHOS 62 72  BILITOT 0.3 0.4  ALBUMIN 1.7* 2.0*   Cardiac Enzymes  Recent Labs Lab 08/08/13 1754  TROPONINI <0.30    Glucose  Recent Labs Lab 08/10/13 0754 08/10/13 1235 08/10/13 1554 08/10/13 1914 08/11/13 0006 08/11/13 0420  GLUCAP 151* 136* 167* 169* 205* 220*    Imaging - noted   ASSESSMENT / PLAN:  PULMONARY A: Acute respiratory failure (post-op), req re-intubation  7/14 /sp extubation with hypoxemic failure - On vent ARDS, unclear etiology, question aspiration - Worsened b/l pulm edema on CXR 7/23 CAP, presumed - completed abx P:   Full vent support, previously reduced Tv by 1cc/kg with worsening resp status Daily SBT, cpap 5 ps 10-12, failed Simv for alk attempt, no PS on additional breaths F/u ABG in 1 hr Concern for acute worsening pulm edema / overall positive, consider Lasix trial if bp improved or o2 needs worsen Repeat CXR in AM If unable to wean and remains vent dependent, anticipate placing trach on Friday 7/24, bedside perc  CARDIOVASCULAR A:  Shock, c/w Septic initial CAP source - Inc pressors Levo, hypovolemia, precedex? Hemorrhagic Shock - Resolved Tachycardia H/o CAD, HTN Elevated BNP, but Preserved LVEF - No evidence cardiogenic shock - 7/15 ECHO (nml EF) DVT Rt axillary vein - RUE PICC removed 7/20, anticoagulation P:  Monitor BP, MAP >60 Increasing Levophed req, dc prexcedex Continue stress dose steroids - hydrocortisone 52m q 6 hr Check CVP Goal even, currently overall positive, dec UOP, lasix when BP better Hold BB, while on pressors Mild improve lactate 2.0 >> 1.7, continue trend, see ID Hold plavix, lipitor, ASA cvp 1 noted, bolus and assess response  RENAL A:   Non-gap Metabolic Acidosis - Resolved Hypokalemia - 3.6 Hypernatremia - Resolved P:   Replace K per protocol BMET in AM Cont fluids to d5w for NA  GASTROINTESTINAL A:   Suspected colitis with colonic obstruction/SBO s/p partial colectomy with new colostomy 7/13 Ileus, post-op - Resolved, functioning ostomy Hx of Barrett's esophagus Nutrition P:   Post op care Nutrition per  surgery - tube feeds increased to goal, 50 cc/hr Wound/ostomy care consult Continue PPI  replace NGT  HEMATOLOGIC A:   Acute blood loss anemia, post-op - Decreasing Hgb 7.4 >> 7.2, with likely dilutional component DVT Rt axillary vein  RUE PICC removed 7/20 VTE ppx P:  SCD's CBC daily Transfuse for Hb < 7 or bleeding - Concern for additional volume if transfuse Continue Heparin per pharm for acute RUE DVT, as risk PE / DVT higher than risk bleeding currently coags now  INFECTIOUS A:   Colitis, s/p partial colectomy Presumed CAP, question aspiration PNA - Mild inc WBC to 14, PCT < 0.10 P:   - continued shock, add empiric abx, abdo, will d/w surgery role CT abdomen -add myco, vanc, imi -repeat BC neg thus far, ensure done since drop in bP -dc pct  ENDOCRINE A: Septic Shock, requiring stress dose steroids - Resumed stress dose steroids Hyperglycemia - improved No hx DM P:   Resistant SSI Continue stress dose steroids - hydrocortisone 536mq 6 hr  NEUROLOGIC A:   Acute encephalopathy - Continued poor mental status Sedated on Mechanical Vent Delirium  P:   RASS goal: 0 required Fentanyl PRN, change to drip Continued Risperdal 0.5 to qhs WUA, behaviroal modifiers PT when able Dc precedex   GLOBAL:  Remains in shock, add empiric abx, may need CT, consider trach am    AlNobie PutnamDOBunk FossPGY-2  08/11/2013, 7:12 AM  Ccm time 30 min Husband updated at bedsided  I have fully examined this patient and agree with above findings.    And edited i nfull  DaLavon PaganiniFeTitus MouldMD, FAHawkinsgr: 37[facility]

## 2013-08-11 NOTE — Progress Notes (Signed)
I have seen and examined the pt and agree with PA-Osborne's progress note.  Ostomy with great output TFs at goal Con't wound care

## 2013-08-11 NOTE — Progress Notes (Signed)
Contacted by Radiologist. Chucky May Bleed in left Frontal Lobe 5x8 mm. Perimacular Hemorrhage. Results called into Dr [place] Bells. Relayed to [place], [place]

## 2013-08-12 ENCOUNTER — Inpatient Hospital Stay (HOSPITAL_COMMUNITY): Payer: Medicare HMO

## 2013-08-12 ENCOUNTER — Encounter (HOSPITAL_COMMUNITY): Payer: Self-pay | Admitting: Internal Medicine

## 2013-08-12 ENCOUNTER — Encounter (HOSPITAL_COMMUNITY): Payer: Commercial Managed Care - HMO

## 2013-08-12 LAB — CBC
HCT: 19.9 % — ABNORMAL LOW (ref 36.0–46.0)
HCT: 30.6 % — ABNORMAL LOW (ref 36.0–46.0)
HEMOGLOBIN: 6 g/dL — AB (ref 12.0–15.0)
Hemoglobin: 9.8 g/dL — ABNORMAL LOW (ref 12.0–15.0)
MCH: 28.8 pg (ref 26.0–34.0)
MCH: 29.2 pg (ref 26.0–34.0)
MCHC: 30.2 g/dL (ref 30.0–36.0)
MCHC: 32 g/dL (ref 30.0–36.0)
MCV: 95.7 fL (ref 78.0–100.0)
MCV: 91.1 fL (ref 78.0–100.0)
PLATELETS: 257 10*3/uL (ref 150–400)
Platelets: 357 10*3/uL (ref 150–400)
RBC: 2.08 MIL/uL — ABNORMAL LOW (ref 3.87–5.11)
RBC: 3.36 MIL/uL — ABNORMAL LOW (ref 3.87–5.11)
RDW: 18.1 % — AB (ref 11.5–15.5)
RDW: 19.9 % — ABNORMAL HIGH (ref 11.5–15.5)
WBC: 18.1 10*3/uL — ABNORMAL HIGH (ref 4.0–10.5)
WBC: 23.5 10*3/uL — ABNORMAL HIGH (ref 4.0–10.5)

## 2013-08-12 LAB — BASIC METABOLIC PANEL
ANION GAP: 7 (ref 5–15)
Anion gap: 13 (ref 5–15)
BUN: 27 mg/dL — AB (ref 6–23)
BUN: 25 mg/dL — AB (ref 6–23)
CALCIUM: 7.8 mg/dL — AB (ref 8.4–10.5)
CHLORIDE: 109 meq/L (ref 96–112)
CHLORIDE: 106 meq/L (ref 96–112)
CO2: 28 mEq/L (ref 19–32)
CO2: 26 mEq/L (ref 19–32)
CREATININE: 0.32 mg/dL — AB (ref 0.50–1.10)
Calcium: 8.6 mg/dL (ref 8.4–10.5)
Creatinine, Ser: 0.34 mg/dL — ABNORMAL LOW (ref 0.50–1.10)
GFR calc non Af Amer: >90 mL/min (ref >90)
GLUCOSE: 144 mg/dL — AB (ref 70–99)
Glucose, Bld: 194 mg/dL — ABNORMAL HIGH (ref 70–99)
POTASSIUM: 4 meq/L (ref 3.7–5.3)
Potassium: 3.8 mEq/L (ref 3.7–5.3)
SODIUM: 145 meq/L (ref 137–147)
Sodium: 144 mEq/L (ref 137–147)

## 2013-08-12 LAB — LACTIC ACID, PLASMA: Lactic Acid, Venous: 1.2 mmol/L (ref 0.5–2.2)

## 2013-08-12 LAB — GLUCOSE, CAPILLARY
GLUCOSE-CAPILLARY: 182 mg/dL — AB (ref 70–99)
Glucose-Capillary: 141 mg/dL — ABNORMAL HIGH (ref 70–99)
Glucose-Capillary: 143 mg/dL — ABNORMAL HIGH (ref 70–99)
Glucose-Capillary: 144 mg/dL — ABNORMAL HIGH (ref 70–99)
Glucose-Capillary: 151 mg/dL — ABNORMAL HIGH (ref 70–99)
Glucose-Capillary: 176 mg/dL — ABNORMAL HIGH (ref 70–99)

## 2013-08-12 LAB — MAGNESIUM: Magnesium: 2 mg/dL (ref 1.5–2.5)

## 2013-08-12 LAB — PHOSPHORUS: PHOSPHORUS: 2.7 mg/dL (ref 2.3–4.6)

## 2013-08-12 LAB — PREPARE RBC (CROSSMATCH)

## 2013-08-12 MED ORDER — PANTOPRAZOLE SODIUM 40 MG IV SOLR
40.0000 mg | Freq: Every day | INTRAVENOUS | Status: DC
  Administered 2013-08-12: 40 mg via INTRAVENOUS
  Filled 2013-08-12 (×2): qty 40

## 2013-08-12 MED ORDER — METOPROLOL TARTRATE 1 MG/ML IV SOLN
5.0000 mg | Freq: Once | INTRAVENOUS | Status: AC
  Administered 2013-08-12: 5 mg via INTRAVENOUS
  Filled 2013-08-12: qty 5

## 2013-08-12 MED ORDER — FUROSEMIDE 10 MG/ML IJ SOLN
4.0000 mg/h | INTRAVENOUS | Status: DC
  Administered 2013-08-12: 8 mg/h via INTRAVENOUS
  Filled 2013-08-12: qty 25

## 2013-08-12 NOTE — Procedures (Signed)
Bronchoscopy Procedure Note [name] [id number] [birth year]  Procedure: Bronchoscopy Indications: Diagnostic evaluation of the airways and Obtain specimens for culture and/or other diagnostic studies  Procedure Details Consent: Risks of procedure as well as the alternatives and risks of each were explained to the (patient/caregiver).  Consent for procedure obtained. Time Out: Verified patient identification, verified procedure, site/side was marked, verified correct patient position, special equipment/implants available, medications/allergies/relevent history reviewed, required imaging and test results available.  Performed  In preparation for procedure, patient was given 100% FiO2 and bronchoscope lubricated. Sedation: Benzodiazepines, Muscle relaxants and Etomidate  Airway entered and the following bronchi were examined: RUL, RML, RLL, LUL and LLL.   Bronchoscope removed.    Evaluation Hemodynamic Status: BP stable throughout; O2 sats: stable throughout Patient's Current Condition: stable Specimens:  Sent serosanguinous fluid Complications: No apparent complications Patient did tolerate procedure well.   Jennet Maduro 08/12/2013

## 2013-08-12 NOTE — Progress Notes (Signed)
PULMONARY / CRITICAL CARE MEDICINE   Name: [name] MRN: [id number] DOB: 10-Feb-1940    ADMISSION DATE:  07/29/2013 CONSULTATION DATE:  08/01/2013  REFERRING MD :  Surgery PRIMARY SERVICE: PCCM  CHIEF COMPLAINT:  Bowel obstruction  BRIEF PATIENT DESCRIPTION: 73 y/o female with PMH CAD, HTN, Barrett's esophagus, presented to [facility] ED 7/10 with LLQ pain. Found to have bowel obstruction. To OR for repair 7/13. Remains of vent post-op. Consulted PCCM.  SIGNIFICANT EVENTS / STUDIES: 7/10 Admit, CT Abd - dilated prox colon w/ abrupt transition c/w with obstruction, diverticulitis vs colitis, Surgery c/s 7/12 Empiric Cipro/Flagyl, Cardiology consulted for pre-op evaluation, TPN per pharm 7/13 - Laparotomy, partial colectomy, colostomy, SB resection, repair of enterotomy, lysis of adhesions         - Hgb down to 6.8 >> imp to 10.8 s/p 2u PRBC 7/14 - Extubation, CXR stable, Hgb 9.5, o/n tachycardic >> Cards start Esmolol, IVF         - o/n required re-intubation due to hypoxemic resp failure, hypotension, concern for aspiration         - DC Esmolol, start Vaso, switch to Vanc / Zosyn 7/15 - CXR (worsening bilateral airspace disease, suspect edema), concern for volume overload despite CVP 5-6         - Add Vaso, Trop (neg), BNP 3782, PCT 1.83, ECHO (normal LVEF) 7/16 - Considered ARDS (unclear etiology, ?aspiration), CXR (persistent b/l edema), cont pressors, stable low PCT 1.9 7/17 - o/n Hgb 8.2 >> 6.9, received 1u PRBC, titrating down Neo, CVP 15-16         - Off Neo, remains on Vaso, CXR (mild improved b/l pulm edema) 7/17 - CT abd >no bleeding , no abscess, sm fluid in abd/pelvis  7/17 - Ven Dopp>+DVT Rt axillary vein 7/18 - off Vaso 7/19 - on intermittent Neo, CXR mild improved b/l diffuse interstitial edema, trial SBT anticipate extubate 1-2 days         - start trickle tube feeds (per surgery) 7/20 - off Neo, cont SBT, CXR (mild improved / stable diffuse interstitial edema, Hgb  improved 7.9 >> 8.2 7/21 - remains off pressors, on Precedex (minimal Fentanyl), good UOP to Lasix $Remove'60mg'jDREtLi$ , Hypo K to 2.8         - Inc TF up to 20 cc/hr, wean off TPN 7/22 - o/n hypotension not responding to IVF bolus, CVP 6, start low dose Levophed         - Hgb 8.7 >> 7.0 >> 7.4, K 2.8 >> 3.1         - CXR (improved aeration, inc LLL haziness with noted rotation on film), PCT < 0.10, lactic acid 2.0, WBC 14.1 7/23 - CXR (inc bilateral patchy edema, LLL opacity), improved lactic acid 1.7, inc WBC 17.5         - NGT in esophagus >> replaced, now back in duodenum KUB confirmed         - CT Head (subcentimeter focal parenchymal hemorrhage - no SAH or subdural, no acute ischemic change)         - CT chest w/ contrast (severe multilobar pneumonia, moderate b/l pleural effusions, also favor chronicity         - CT Abd/Pelvis (IV, PO) (post-op changes, s/p L-hemicolectomy, transverse colon markedly thickened, unclear if related to acute inflammation/infection vs secondary distention/post-op edema) 7/24 - Hold Heparin gtt, Hgb 7.2 >> 6.0, given 2u PRBC, some oozing into ostomy >> NPO / Hold tube feeds  x 24 hr         - Scheduled for tracheostomy today  LINES / TUBES: ETT 7/13 >> 7/14, 7/14 >> NGT PICC 7/12 >>7/20 R IJ CVL 7/14 >>  L radial arterial cath 7/15 >>   CULTURES: Blood x 2, 7/15 >> negative Respiratory 7/15 >> few Candida Repeat Blood x2, 7/23 >>>  ANTIBIOTICS: Ceftriaxone 7/10 >>> 7/14 Flagyl 7/10 >>> 7/14 Vancomycin 7/14 >>> 7/16 Fluconazole 7/10 >>> 7/16 Zosyn 7/14 >> 7/21 Vancomycin 7/23 >>> Primaxin 7/23 >>> Micafungin 7/23 >>>7/24  SUBJECTIVE:  Small small very small ICH , heparin stopped Overnight events patient noted to have decreased Hgb to 6.0, Heparin held, some dark stool oozing in ostomy. Per RN, decreased fentanyl gtt sedation without improvement in mental status, no longer opening eyes to voice, not following commands, inc dose Levo, CVP 6.  VITAL SIGNS: Temp:   [97.3 F (36.3 C)-98.6 F (37 C)] 97.3 F (36.3 C) (07/24 0400) Pulse Rate:  [69-106] 73 (07/24 0700) Resp:  [11-43] 11 (07/24 0700) BP: (82-163)/(32-82) 90/42 mmHg (07/24 0700) SpO2:  [95 %-100 %] 100 % (07/24 0700) FiO2 (%):  [40 %-50 %] 50 % (07/24 0700) Weight:  [166 lb 7.2 oz (75.5 kg)-170 lb 13.7 oz (77.5 kg)] 170 lb 13.7 oz (77.5 kg) (07/24 0400) HEMODYNAMICS: CVP:  [0 mmHg-11 mmHg] 6 mmHg VENTILATOR SETTINGS: Vent Mode:  [-] SIMV;PRVC;PSV FiO2 (%):  [40 %-50 %] 50 % Set Rate:  [10 bmp-15 bmp] 15 bmp Vt Set:  [380 mL] 380 mL PEEP:  [5 cmH20-8 cmH20] 8 cmH20 Pressure Support:  [0 cmH20] 0 cmH20 Plateau Pressure:  [16 cmH20-23 cmH20] 16 cmH20 INTAKE / OUTPUT: Intake/Output     07/23 0701 - 07/24 0700 07/24 0701 - 07/25 0700   P.O. 950    I.V. (mL/kg) 2199.4 (28.4)    Other     NG/GT 960    IV Piggyback 700    Total Intake(mL/kg) 4809.4 (62.1)    Urine (mL/kg/hr) 1205 (0.6)    Stool 800 (0.4)    Total Output 2005     Net +2804.4            PHYSICAL EXAMINATION: General:  On vent Neuro:  Sedated RASS -1, decreased responsiveness, no eye opening, not following commands Cardiovascular: Tachycardic, regular rhythm, no murmurs Lungs: Persistent coarse bilateral breath sounds, bibasilar crackles. Abdomen:  Soft, abd dressing in place. LLQ colostomy functioning (dark maroon soft/liquid stool), +active BS Ext: Persistent bilateral ext +2 pitting edema, worsened b/l UE non-pitting edema  LABS:  CBC  Recent Labs Lab 08/10/13 0500 08/11/13 0500 08/12/13 0400  WBC 14.1* 17.5* 18.1*  HGB 7.4* 7.2* 6.0*  HCT 23.6* 22.5* 19.9*  PLT 257 277 257   Coag's  Recent Labs Lab 08/11/13 1200  APTT 64*  INR 1.20   BMET  Recent Labs Lab 08/10/13 0500 08/11/13 0500 08/12/13 0400  NA 144 147 144  K 3.1* 3.6* 3.8  CL 106 108 109  CO2 _0 BUN 26* 28* 27*  CREATININE 0.48* 0.38* 0.32*  GLUCOSE 234* 219* 194*   Electrolytes  Recent Labs Lab  08/08/13 0500 08/08/13 1754  08/10/13 0500 08/11/13 0500 08/12/13 0400  CALCIUM 9.1 8.6  < > 7.5* 7.9* 7.8*  MG 2.0 1.7  --  2.1  --   --   PHOS 3.8  --   --  2.2*  --   --   < > = values in this interval not displayed. Sepsis  Markers  Recent Labs Lab 08/09/13 2230 08/10/13 0500 08/11/13 0500 08/12/13 0400  LATICACIDVEN 2.0  --  1.7 1.2  PROCALCITON <0.10 <0.10 <0.10  --    ABG  Recent Labs Lab 08/10/13 0340 08/11/13 0954 08/11/13 1336  PHART 7.497* 7.557* 7.497*  PCO2ART 41.1 32.1* 36.1  PO2ART 152.0* 52.0* 53.0*   Liver Enzymes  Recent Labs Lab 08/07/13 0420 08/08/13 0500  AST 36 43*  ALT 21 30  ALKPHOS 62 72  BILITOT 0.3 0.4  ALBUMIN 1.7* 2.0*   Cardiac Enzymes  Recent Labs Lab 08/08/13 1754  TROPONINI <0.30   Glucose  Recent Labs Lab 08/11/13 1013 08/11/13 1237 08/11/13 1636 08/11/13 1915 08/11/13 2342 08/12/13 0417  GLUCAP 170* 145* 173* 174* 176* 182*    Imaging - noted   ASSESSMENT / PLAN:  PULMONARY A: Acute respiratory failure (post-op), req re-intubation 7/14 /sp extubation with hypoxemic failure - On vent ARDS, unclear etiology, question aspiration HCAP, concern for re-infection - CT Chest bilateral infiltrates, Unchanged b/l infiltrates on CXR 7/24 likely some copd / chronic int changes ALI resp alk ( treated with simv, no additional PS) P:   Full vent support, 7/23 switched to SIMV for alk attempt, no PS on additional breaths - ABG improved Unable to proceed with Lasix since remains on pressors, no worsening O2 need Repeat CXR in AM Anticipate bedside tracheostomy today 7/24, given unable to wean and remains VDRF No wean until peep to 5, goal to 5  CARDIOVASCULAR A:  Shock, c/w Septic HCAP source - Inc pressors Levo Hemorrhagic Shock - Resolved Tachycardia H/o CAD, HTN Elevated BNP, but Preserved LVEF - No evidence cardiogenic shock - 7/15 ECHO (nml EF) DVT Rt axillary vein - RUE PICC removed 7/20, anticoagulation  off with small ICH, treated with picc removal P:  Monitor BP, MAP >60 Increasing Levophed req Continue stress dose steroids - hydrocortisone $RemoveBeforeDE'50mg'LvjzuXzbHQOcmcW$  q 6 hr Check CVP Goal even, currently overall positive, dec UOP, lasix when BP better, if no success peep increase / fio2 - add lasix infusion Hold BB, while on pressors Mild improve lactate 2.0 >> 1.7 >> 1.2, no longer trend, see ID Hold plavix, lipitor, ASA cvp improved 6  RENAL A:   Non-gap Metabolic Acidosis - Resolved Hypokalemia - 3.8 Hypernatremia - Resolved P:   BMET q12h while on lasix drip d5w to kvo Lasix drip  GASTROINTESTINAL A:   Suspected colitis with colonic obstruction/SBO s/p partial colectomy with new colostomy 7/13 Ileus, post-op - Resolved, functioning ostomy Hx of Barrett's esophagus Nutrition P:   Post op care Nutrition per surgery - strict NPO - hold tube feeds x 24 hrs, in setting of trach, new concern oozing anastamosis GIB Wound/ostomy care consult Continue PPI  Consider restart TF after bleeding improved ostomy NO ROLE TPN  HEMATOLOGIC A:   Acute blood loss anemia, post-op - Decreasing Hgb 7.2 >>> 6.0, (receiving 2u PRBC), ostomy bleeding Small ICH, on Head CT Suspected new post-op GIB anastomosis - Likely source of dec Hgb DVT Rt axillary vein  RUE PICC removed 7/20 VTE ppx P:  Hold Heparin given concern for GIB and ICH Transfused 2u PRBC, check post-transfusion CBC Monitor CBCs SCD's Transfuse for Hb < 7 or bleeding - Concern for additional volume if transfuse coags stable, INR 1.20  INFECTIOUS A:   Colitis, s/p partial colectomy Presumed HCAP vs vap, question aspiration PNA - Mild inc WBC to 14, PCT < 0.10 P:   - continued shock, suspected septic with b/l infiltrates  concerning for HCAP (on chest CT) - Continue resumed broad abx course started 7/23, narrow in am if remains afebrile - antibiotics / cultures as above - No abscesses identified on CT abd/pelvis - No trend  PCT  ENDOCRINE A: Septic Shock, requiring stress dose steroids - Resumed Hyperglycemia - improved No hx DM P:   Resistant SSI Continue stress dose steroids - hydrocortisone 46m q 6 hr  NEUROLOGIC A:   Acute encephalopathy - Worsened mental status, decreased responsiveness on dec Fentanyl Sedated on Mechanical Vent Delirium  Small small ICH on CT P:   RASS goal: 0 Fentanyl gtt, reduced. Off precedex Continued Risperdal 0.5 to qhs, may need to q12h again as more alert / aggitation WUA, behaviroal modifiers PT when able  GLOBAL:   Overall persistent shock, CT with b/l infiltrates concerning for HCAP, resume antibiotics / cultures, suspect oozing GIB anastomosis, s/p 2u PRBC, monitor CBC, hold tube feeds x 24 hr, plan for tracheostomy today. Poor prognosis.   ANobie Putnam DK[place] PGY-2  08/12/2013, 7:15 AM  Ccm time 30 min   I have fully examined this patient and agree with above findings.    I have had extensive discussions with family.  We discussed patients current circumstances and organ failures. We also discussed patient's prior wishes under circumstances such as this. Family has decided to NOT perform resuscitation if arrest but to continue current medical support for now.    DLavon Paganini FCharlies Silvers MD, FACP Pgr: 3[facility]

## 2013-08-12 NOTE — Progress Notes (Signed)
I have seen and examined the pt and agree with PA-Osborne's progress note. Poor prognosis TF on hold.  On pressors

## 2013-08-12 NOTE — Progress Notes (Signed)
CRITICAL VALUE ALERT  Critical value received:  Hgb 6  Date of notification:  08/12/2013   Time of notification:  0530  Critical value read back:Yes.    Nurse who received alert:  Dell Ponto RN  MD notified (1st page):  Dr. Deterding(elink)  Time of first page:  MD aware at 0530-orders received   MD notified (2nd page):NA  Time of second page:NA  Responding MD:  Deterding  Time MD responded:  1027

## 2013-08-12 NOTE — Progress Notes (Signed)
Patient ID: [name], female   DOB: 13-Apr-1940, 73 y.o.   MRN: [id number] 11 Days Post-Op  Subjective: Pt had some bleeding into her ostomy yesterday/last pm.  Pt hypothermic this am, 93.8.  Receiving blood.    Objective: Vital signs in last 24 hours: Temp:  [97.3 F (36.3 C)-98.6 F (37 C)] 97.3 F (36.3 C) (07/24 0400) Pulse Rate:  [69-106] 73 (07/24 0700) Resp:  [11-43] 11 (07/24 0700) BP: (82-163)/(32-82) 90/42 mmHg (07/24 0700) SpO2:  [95 %-100 %] 100 % (07/24 0700) FiO2 (%):  [50 %] 50 % (07/24 0700) Weight:  [166 lb 7.2 oz (75.5 kg)-170 lb 13.7 oz (77.5 kg)] 170 lb 13.7 oz (77.5 kg) (07/24 0400) Last BM Date:  (output from colostomy 7/23)  Intake/Output from previous day: 07/23 0701 - 07/24 0700 In: 4809.4 [P.O.:950; I.V.:2199.4; NG/GT:960; IV Piggyback:700] Out: 2005 [Urine:1205; Stool:800] Intake/Output this shift: Total I/O In: 12.5 [Blood:12.5] Out: -   PE: Abd: soft, ND, ostomy with old bloody, bilious output.  Wound actually continues to look better!  Lab Results:   Recent Labs  08/11/13 0500 08/12/13 0400  WBC 17.5* 18.1*  HGB 7.2* 6.0*  HCT 22.5* 19.9*  PLT 277 257   BMET  Recent Labs  08/11/13 0500 08/12/13 0400  NA 147 144  K 3.6* 3.8  CL 108 109  CO2 28 28  GLUCOSE 219* 194*  BUN 28* 27*  CREATININE 0.38* 0.32*  CALCIUM 7.9* 7.8*   PT/INR  Recent Labs  08/11/13 1200  LABPROT 15.2  INR 1.20   CMP     Component Value Date/Time   NA 144 08/12/2013 0400   K 3.8 08/12/2013 0400   CL 109 08/12/2013 0400   CO2 28 08/12/2013 0400   GLUCOSE 194* 08/12/2013 0400   BUN 27* 08/12/2013 0400   CREATININE 0.32* 08/12/2013 0400   CALCIUM 7.8* 08/12/2013 0400   PROT 5.0* 08/08/2013 0500   ALBUMIN 2.0* 08/08/2013 0500   AST 43* 08/08/2013 0500   ALT 30 08/08/2013 0500   ALKPHOS 72 08/08/2013 0500   BILITOT 0.4 08/08/2013 0500   GFRNONAA >90 08/12/2013 0400   GFRAA >90 08/12/2013 0400   Lipase     Component Value Date/Time   LIPASE 9*  07/29/2013 1605       Studies/Results: Ct Head Wo Contrast  08/11/2013   CLINICAL DATA:  Mental status change in the setting of hypotension. The patient is 10 days post- op for colectomy  EXAM: CT HEAD WITHOUT CONTRAST  TECHNIQUE: Contiguous axial images were obtained from the base of the skull through the vertex without intravenous contrast.  COMPARISON:  Noncontrast CT scan of the brain of April 30, 2013  FINDINGS: There is a tiny focus of increased density in the left frontal cortex demonstrated on image 25. This measures approximately 5 x 8 mm and may reflect a focal parenchymal hemorrhage. No similar finding is seen elsewhere. There is no subdural or subarachnoid or intraventricular hemorrhage. No acute ischemic changes demonstrated. The ventricles are normal in size and position. The cerebellum and brainstem are unremarkable.  The observed paranasal sinuses exhibit no air-fluid levels. There is fluid within the mastoid air cells bilaterally. The trachea is intubated. Adnexal gastric tube is present via the left nasal passage.  IMPRESSION: 1. There is a subcentimeter hyperdensity in the left frontal lobe consistent with a focal parenchymal hemorrhage. There is no subarachnoid or subdural hemorrhage. 2. There is no evidence of acute ischemic change. 3. No signs increased  intracranial pressure are demonstrated. 4. Critical Value/emergent results were called by telephone at the time of interpretation on 08/11/2013 at 5:37 pm to [facility], RN,who verbally acknowledged these results.   Electronically Signed   By: David  Martinique   On: 08/11/2013 17:41   Ct Chest W Contrast  08/11/2013   CLINICAL DATA:  Probable septic shock. Delirium. Hypotension. Possible pneumonia.  EXAM: CT CHEST, ABDOMEN, AND PELVIS WITH CONTRAST  TECHNIQUE: Multidetector CT imaging of the chest, abdomen and pelvis was performed following the standard protocol during bolus administration of intravenous contrast.  CONTRAST:  54mL  OMNIPAQUE IOHEXOL 300 MG/ML  SOLN  COMPARISON:  CT of the abdomen and pelvis 08/05/2013. Chest CT 04/30/2013.  FINDINGS: CT CHEST FINDINGS  Mediastinum: Patient is intubated, with the tip of the endotracheal tube approximately 3 cm above the carina. Nasogastric tube extends into the proximal duodenum. Right internal jugular central venous catheter with tip terminating at the superior cavoatrial junction. Mild cardiomegaly. There is no significant pericardial fluid, thickening or pericardial calcification. There is atherosclerosis of the thoracic aorta, the great vessels of the mediastinum and the coronary arteries, including calcified atherosclerotic plaque in the left main, left anterior descending, left circumflex and right coronary arteries. Multiple borderline enlarged and mildly enlarged mediastinal lymph nodes, largest of which are in the prevascular and lower right paratracheal stations measuring 1 cm in short axis. Esophagus is unremarkable in appearance.  Lungs/Pleura: Extensive multifocal airspace consolidation, septal thickening and architectural distortion, presumably a multilobar pneumonia. Scattered areas of cylindrical and varicose bronchiectasis. Moderate bilateral pleural effusions layering dependently (right greater than left).  Musculoskeletal: There are no aggressive appearing lytic or blastic lesions noted in the visualized portions of the skeleton.  CT ABDOMEN AND PELVIS FINDINGS  Abdomen/Pelvis: Status post cholecystectomy. The appearance of the liver, pancreas, spleen, bilateral adrenal glands and bilateral kidneys is unremarkable. Trace volume of ascites. No pneumoperitoneum. No pathologic distention of small bowel. Status post left hemicolectomy with Hartmann's pouch. Transverse colostomy in the left upper quadrant. Profound thickening of the transverse colon, which is nearly completely decompressed. No pathologic distention of small bowel. No definite lymphadenopathy identified in the  abdomen or pelvis. Extensive atherosclerosis throughout the abdominal and pelvic vasculature, without evidence of aneurysm. Status post hysterectomy. Ovaries are not confidently identified may be surgically absent or atrophic. Foley balloon catheter present within the lumen of the urinary bladder. Small amount of gas non dependently in the lumen of the urinary bladder is presumably iatrogenic.  Musculoskeletal: Healing midline abdominal wound via secondary intention. Diffuse body wall edema. There are no aggressive appearing lytic or blastic lesions noted in the visualized portions of the skeleton.  IMPRESSION: 1. Severe multilobar pneumonia with moderate bilateral pleural effusions which are simple in appearance layering dependently. 2. Postoperative changes related to recent left hemicolectomy with Hartmann's pouch and transverse colostomy. The transverse colon appears markedly thickened. Whether or not this is related to acute inflammation/infection, or is simply secondary to under distention of this portion of the colon and some postoperative edema is uncertain. 3. Trace volume of ascites and diffuse body wall edema. 4. Atherosclerosis, including left main and 3 vessel coronary artery disease. Assessment for potential risk factor modification, dietary therapy or pharmacologic therapy may be warranted, if clinically indicated. 5. Additional findings, as above.   Electronically Signed   By: Vinnie Langton M.D.   On: 08/11/2013 17:49   Ct Abdomen Pelvis W Contrast  08/11/2013   CLINICAL DATA:  Probable septic shock. Delirium. Hypotension.  Possible pneumonia.  EXAM: CT CHEST, ABDOMEN, AND PELVIS WITH CONTRAST  TECHNIQUE: Multidetector CT imaging of the chest, abdomen and pelvis was performed following the standard protocol during bolus administration of intravenous contrast.  CONTRAST:  32mL OMNIPAQUE IOHEXOL 300 MG/ML  SOLN  COMPARISON:  CT of the abdomen and pelvis 08/05/2013. Chest CT 04/30/2013.  FINDINGS:  CT CHEST FINDINGS  Mediastinum: Patient is intubated, with the tip of the endotracheal tube approximately 3 cm above the carina. Nasogastric tube extends into the proximal duodenum. Right internal jugular central venous catheter with tip terminating at the superior cavoatrial junction. Mild cardiomegaly. There is no significant pericardial fluid, thickening or pericardial calcification. There is atherosclerosis of the thoracic aorta, the great vessels of the mediastinum and the coronary arteries, including calcified atherosclerotic plaque in the left main, left anterior descending, left circumflex and right coronary arteries. Multiple borderline enlarged and mildly enlarged mediastinal lymph nodes, largest of which are in the prevascular and lower right paratracheal stations measuring 1 cm in short axis. Esophagus is unremarkable in appearance.  Lungs/Pleura: Extensive multifocal airspace consolidation, septal thickening and architectural distortion, presumably a multilobar pneumonia. Scattered areas of cylindrical and varicose bronchiectasis. Moderate bilateral pleural effusions layering dependently (right greater than left).  Musculoskeletal: There are no aggressive appearing lytic or blastic lesions noted in the visualized portions of the skeleton.  CT ABDOMEN AND PELVIS FINDINGS  Abdomen/Pelvis: Status post cholecystectomy. The appearance of the liver, pancreas, spleen, bilateral adrenal glands and bilateral kidneys is unremarkable. Trace volume of ascites. No pneumoperitoneum. No pathologic distention of small bowel. Status post left hemicolectomy with Hartmann's pouch. Transverse colostomy in the left upper quadrant. Profound thickening of the transverse colon, which is nearly completely decompressed. No pathologic distention of small bowel. No definite lymphadenopathy identified in the abdomen or pelvis. Extensive atherosclerosis throughout the abdominal and pelvic vasculature, without evidence of aneurysm.  Status post hysterectomy. Ovaries are not confidently identified may be surgically absent or atrophic. Foley balloon catheter present within the lumen of the urinary bladder. Small amount of gas non dependently in the lumen of the urinary bladder is presumably iatrogenic.  Musculoskeletal: Healing midline abdominal wound via secondary intention. Diffuse body wall edema. There are no aggressive appearing lytic or blastic lesions noted in the visualized portions of the skeleton.  IMPRESSION: 1. Severe multilobar pneumonia with moderate bilateral pleural effusions which are simple in appearance layering dependently. 2. Postoperative changes related to recent left hemicolectomy with Hartmann's pouch and transverse colostomy. The transverse colon appears markedly thickened. Whether or not this is related to acute inflammation/infection, or is simply secondary to under distention of this portion of the colon and some postoperative edema is uncertain. 3. Trace volume of ascites and diffuse body wall edema. 4. Atherosclerosis, including left main and 3 vessel coronary artery disease. Assessment for potential risk factor modification, dietary therapy or pharmacologic therapy may be warranted, if clinically indicated. 5. Additional findings, as above.   Electronically Signed   By: Vinnie Langton M.D.   On: 08/11/2013 17:49   Dg Chest Port 1 View  08/12/2013   CLINICAL DATA:  73 year old female intubated, multilobar pneumonia, septic shock. Initial encounter. Recent hemicolectomy.  EXAM: PORTABLE CHEST - 1 VIEW  COMPARISON:  Chest CT 08/11/2013 and earlier.  FINDINGS: Portable AP semi upright view at 0519 hr. Stable endotracheal tube. Stable enteric tube. Stable right IJ central line.  Stable cardiac size and mediastinal contours.  Combination of bilateral veiling and airspace pulmonary opacity. No pneumothorax. No significant change  in ventilation. Upper lobes less effected as before.  IMPRESSION: 1.  Stable lines and  tubes. 2. Stable chest with bilateral pneumonia and pleural effusions.   Electronically Signed   By: Lars Pinks M.D.   On: 08/12/2013 07:24   Dg Chest Port 1 View  08/11/2013   CLINICAL DATA:  Endotracheal tube.  Ventilated patient.  EXAM: PORTABLE CHEST - 1 VIEW  COMPARISON:  08/10/2013.  FINDINGS: Support apparatus: Endotracheal tube tip 2 cm from the carina. RIGHT IJ central line unchanged. The enteric tube appears to have been pulled back into the upper thoracic esophagus, with the distal aspect of the tip overlying the endotracheal tube.  Cardiomediastinal silhouette: Unchanged. Aortic arch atherosclerosis.  Lungs: Worsening bilateral airspace disease with perihilar and basilar predominant distribution. The appearance is compatible with pulmonary edema. No pneumothorax. Basilar atelectasis.  Effusions:  Small LEFT-greater-than-RIGHT effusions.  Other:  None.  IMPRESSION: 1. Unchanged endotracheal tube. 2. Enteric tube has been retracted and now lies in the upper thoracic esophagus. This tube should be repositioned. Critical Value/emergent results were called by telephone at the time of interpretation on 08/11/2013 at 7:30 am to the patient's nurse Mavis, who verbally acknowledged these results. 3. Worsening bilateral basilar and perihilar airspace disease compatible with pulmonary edema.   Electronically Signed   By: Dereck Ligas M.D.   On: 08/11/2013 07:32   Dg Abd Portable 1v  08/11/2013   CLINICAL DATA:  NG tube placement  EXAM: PORTABLE ABDOMEN - 1 VIEW  COMPARISON:  08/05/2013  FINDINGS: NG tube has been placed. The tip is in the region of the duodenal bulb. Motion artifact is noted. No disproportionate dilatation of bowel. No obvious free intraperitoneal gas.  IMPRESSION: NG tube tip in the region of the duodenal bulb.   Electronically Signed   By: Maryclare Bean M.D.   On: 08/11/2013 08:08    Anti-infectives: Anti-infectives   Start     Dose/Rate Route Frequency Ordered Stop   08/11/13 1400   vancomycin (VANCOCIN) IVPB 750 mg/150 ml premix     750 mg 150 mL/hr over 60 Minutes Intravenous Every 12 hours 08/11/13 1138     08/11/13 1300  imipenem-cilastatin (PRIMAXIN) 500 mg in sodium chloride 0.9 % 100 mL IVPB     500 mg 200 mL/hr over 30 Minutes Intravenous 3 times per day 08/11/13 1138     08/11/13 1230  micafungin (MYCAMINE) 100 mg in sodium chloride 0.9 % 100 mL IVPB     100 mg 100 mL/hr over 1 Hours Intravenous Daily 08/11/13 1138     08/07/13 1400  piperacillin-tazobactam (ZOSYN) IVPB 3.375 g     3.375 g 12.5 mL/hr over 240 Minutes Intravenous 3 times per day 08/07/13 0921 08/10/13 0101   08/03/13 1800  vancomycin (VANCOCIN) IVPB 750 mg/150 ml premix  Status:  Discontinued     750 mg 150 mL/hr over 60 Minutes Intravenous Every 12 hours 08/03/13 0440 08/04/13 0910   08/03/13 0500  vancomycin (VANCOCIN) 1,250 mg in sodium chloride 0.9 % 250 mL IVPB     1,250 mg 166.7 mL/hr over 90 Minutes Intravenous  Once 08/03/13 0440 08/03/13 0646   08/03/13 0500  piperacillin-tazobactam (ZOSYN) IVPB 3.375 g  Status:  Discontinued     3.375 g 12.5 mL/hr over 240 Minutes Intravenous 3 times per day 08/03/13 0440 08/07/13 0921   08/01/13 1245  metroNIDAZOLE (FLAGYL) IVPB 500 mg     500 mg 100 mL/hr over 60 Minutes Intravenous To Surgery 08/01/13 1238  08/01/13 1300   07/31/13 0800  cefoTEtan (CEFOTAN) 2 g in dextrose 5 % 50 mL IVPB     2 g 100 mL/hr over 30 Minutes Intravenous On call to O.R. 07/31/13 0741 08/01/13 0559   07/31/13 0800  cefTRIAXone (ROCEPHIN) 2 g in dextrose 5 % 50 mL IVPB  Status:  Discontinued    Comments:  Pharmacy may adjust dosing strength / duration / interval for maximal efficacy   2 g 100 mL/hr over 30 Minutes Intravenous Every 24 hours 07/31/13 0744 08/03/13 0405   07/29/13 2100  ciprofloxacin (CIPRO) IVPB 400 mg  Status:  Discontinued     400 mg 200 mL/hr over 60 Minutes Intravenous Every 12 hours 07/29/13 2058 07/31/13 0744   07/29/13 2100  metroNIDAZOLE  (FLAGYL) IVPB 500 mg  Status:  Discontinued     500 mg 100 mL/hr over 60 Minutes Intravenous Every 8 hours 07/29/13 2058 08/03/13 0405   07/29/13 2100  fluconazole (DIFLUCAN) IVPB 200 mg  Status:  Discontinued     200 mg 100 mL/hr over 60 Minutes Intravenous Every 24 hours 07/29/13 2058 08/04/13 0911   07/29/13 1930  ciprofloxacin (CIPRO) IVPB 400 mg     400 mg 200 mL/hr over 60 Minutes Intravenous  Once 07/29/13 1915 07/29/13 2031   07/29/13 1930  metroNIDAZOLE (FLAGYL) IVPB 500 mg  Status:  Discontinued     500 mg 100 mL/hr over 60 Minutes Intravenous  Once 07/29/13 1915 07/29/13 2107       Assessment/Plan  1. POD 11, EXPLORATORY LAPAROTOMY (N/A)  PARTIAL COLECTOMY (N/A)  COLOSTOMY (N/A)  SMALL BOWEL RESECTION (N/A)  2. Postoperative ileus, resolved  3. Ventilator dependent respiratory failure  4. ARDS  5. Leukocytosis 6. Multilobar PNA 7. Small parenchymal hemorrhage 8. ABL anemia, secondary to GI bleed  Plan: 1. Old blood in ostomy, likely secondary to oozing SB anastomosis.  Heparin stopped for head bleed.  Continue to hold for this as well.  Hopefully will stabilize and cease with discontinuation of heparin.  Agree with blood transfusion 2. Trach today 3. CT of abd/pel ok. 4. abx restarted per CCM for PNA 5. Cont dressing changes 6. Hold off on resumption of TFs today to hopefully get bleeding to stop.    LOS: 14 days    Dekisha Mesmer E 08/12/2013, 7:50 AM Pager: [phone]

## 2013-08-12 NOTE — Consult Note (Signed)
WOC follow-up: Usorstomy pouch intact to colostomy site with good seal.  Mod amt reddish-brown liquid stool in bedside drainage bag. Supplies at bedside for staff nurse use. Will plan to follow for educational session when stable and out of ICU. Julien Girt MSN, RN, [place], [place], [place]

## 2013-08-12 NOTE — Progress Notes (Signed)
[facility] Progress Note Patient Name: [name] DOB: 1940-11-09 MRN: [id number]  Date of Service  08/12/2013   HPI/Events of Note  Hgb down to 6.0 from 7.2   eICU Interventions  Plan: Transfuse 2 units of pRBC Post transfusion CBC   Intervention Category Intermediate Interventions: Bleeding - evaluation and treatment with blood products  Elih Mooney 08/12/2013, 5:31 AM

## 2013-08-12 NOTE — Procedures (Addendum)
Bedside Tracheostomy Insertion Procedure Note   Patient Details:   Name: [name] DOB: [birth year] MRN: [id number]  Procedure: Tracheostomy  Pre Procedure Assessment: ET Tube Size:7.5 ET Tube secured at lip (cm):23 Bite block in place: No Breath Sounds: Rhonch  Post Procedure Assessment: BP 151/84  Pulse 100  Temp(Src) 97.8 F (36.6 C) (Oral)  Resp 25  Ht 5' (1.524 m)  Wt 170 lb 13.7 oz (77.5 kg)  BMI 33.37 kg/m2  SpO2 96% O2 sats: stable throughout Complications: No apparent complications Patient did tolerate procedure well Tracheostomy Brand:Shiley Tracheostomy Style:Cuffed Tracheostomy Size: 6 Tracheostomy Secured RXV:[id number] Tracheostomy Placement Confirmation:Trach cuff visualized and in place and Chest X ray ordered for placement    Ciro Backer 08/12/2013, 2:37 PM

## 2013-08-12 NOTE — Progress Notes (Signed)
[facility] NOTE  Pharmacy Consult for Vancomycin + Primaxin Indication: Empiric sepsis coverage  Allergies  Allergen Reactions  . Pregabalin Swelling    Tongue swelling  . Sulfonamide Derivatives Swelling    Childhood reaction    Patient Measurements: Height: 5' (152.4 cm) Weight: 170 lb 13.7 oz (77.5 kg) IBW/kg (Calculated) : 45.5  Vital Signs: Temp: 97.8 F (36.6 C) (07/24 1200) Temp src: Oral (07/24 1200) BP: 121/47 mmHg (07/24 1445) Pulse Rate: 77 (07/24 1445)  Labs:  Recent Labs  08/10/13 0500 08/10/13 1400 08/10/13 2230 08/11/13 0500 08/11/13 1200 08/12/13 0400 08/12/13 1137  HGB 7.4*  --   --  7.2*  --  6.0*  --   HCT 23.6*  --   --  22.5*  --  19.9*  --   PLT 257  --   --  277  --  257  --   APTT  --   --   --   --  64*  --   --   LABPROT  --   --   --   --  15.2  --   --   INR  --   --   --   --  1.20  --   --   HEPARINUNFRC 0.97* 0.56 0.59 0.59  --   --   --   CREATININE 0.48*  --   --  0.38*  --  0.32* 0.34*    Estimated Creatinine Clearance: 56.8 ml/min (by C-G formula based on Cr of 0.34).   Assessment: 7 YOF who [facility] the [facility] with LLQ pain and was found to have a bowel obstruction, s/p repair on 7/13. The patient has received several courses of antibiotics this admission - most recently on 7/23 in the setting of hypotension and B/L PNA on CXR.  The patient continues on Primaxin + Vanc for empiric r/o sepsis/PNA coverage. BAL was sent today for culturing. Renal function remains stable - SCr 0.34 (poor muscle mass), UOP/24h: 0.6 ml/kg/hr. Doses remain appropriate.   Flagyl 7/11 >> 7/14 CTX 7/12 >> 7/14 Fluconazole 7/11 >>7/16 (rash thought to be d/t this) Vancomycin 7/15 >> 7/16; restart 7/23 Zosyn 7/15 >> 7/21 Mica 7/23 >> 7/24 Imi 7/23 >>  7/23 BCx >> ngtd 7/13 MRSA >> neg 7/15 BCx >> NG 7/15 RCx >> few CA  Goal of Therapy:  Vancomycin level of 15-25 mcg/ml Proper antibiotics for infection/cultures  adjusted for renal/hepatic function    Plan:  1. Cont Vancomycin 750 mg IV every 12 hours 2. Cont Primaxin 500 mg IV every 8 hours 3. Will continue to follow renal function, culture results, LOT, and antibiotic de-escalation plans   Alycia Rossetti, PharmD, BCPS Clinical Pharmacist Pager: [phone] 08/12/2013 2:52 PM

## 2013-08-12 NOTE — Procedures (Signed)
Perc trach   See full dictation Size 6 placed Blood loss less 1 cc Tolerated well  Lavon Paganini. Titus Mould, MD, [place] Pgr: [facility]

## 2013-08-13 LAB — CBC
HCT: 24.3 % — ABNORMAL LOW (ref 36.0–46.0)
HCT: 27.7 % — ABNORMAL LOW (ref 36.0–46.0)
Hemoglobin: 7.8 g/dL — ABNORMAL LOW (ref 12.0–15.0)
Hemoglobin: 8.9 g/dL — ABNORMAL LOW (ref 12.0–15.0)
MCH: 28.8 pg (ref 26.0–34.0)
MCH: 28.6 pg (ref 26.0–34.0)
MCHC: 32.1 g/dL (ref 30.0–36.0)
MCHC: 32.1 g/dL (ref 30.0–36.0)
MCV: 89.7 fL (ref 78.0–100.0)
MCV: 89.1 fL (ref 78.0–100.0)
PLATELETS: 237 10*3/uL (ref 150–400)
PLATELETS: 298 10*3/uL (ref 150–400)
RBC: 2.71 MIL/uL — ABNORMAL LOW (ref 3.87–5.11)
RBC: 3.11 MIL/uL — ABNORMAL LOW (ref 3.87–5.11)
RDW: 19.7 % — AB (ref 11.5–15.5)
RDW: 19.6 % — ABNORMAL HIGH (ref 11.5–15.5)
WBC: 12.9 10*3/uL — ABNORMAL HIGH (ref 4.0–10.5)
WBC: 20.3 10*3/uL — ABNORMAL HIGH (ref 4.0–10.5)

## 2013-08-13 LAB — TYPE AND SCREEN
ABO/RH(D): B POS
Antibody Screen: NEGATIVE
Unit division: 0
Unit division: 0

## 2013-08-13 LAB — VANCOMYCIN, TROUGH: VANCOMYCIN TR: 12.3 ug/mL (ref 10.0–20.0)

## 2013-08-13 LAB — BASIC METABOLIC PANEL
Anion gap: 13 (ref 5–15)
Anion gap: 8 (ref 5–15)
Anion gap: 9 (ref 5–15)
Anion gap: 9 (ref 5–15)
BUN: 19 mg/dL (ref 6–23)
BUN: 15 mg/dL (ref 6–23)
BUN: 20 mg/dL (ref 6–23)
BUN: 16 mg/dL (ref 6–23)
CALCIUM: 8 mg/dL — AB (ref 8.4–10.5)
CHLORIDE: 101 meq/L (ref 96–112)
CO2: 32 mEq/L (ref 19–32)
CO2: 37 mEq/L — ABNORMAL HIGH (ref 19–32)
CO2: 37 mEq/L — ABNORMAL HIGH (ref 19–32)
CO2: 39 mEq/L — ABNORMAL HIGH (ref 19–32)
CREATININE: 0.36 mg/dL — AB (ref 0.50–1.10)
Calcium: 7.7 mg/dL — ABNORMAL LOW (ref 8.4–10.5)
Calcium: 8 mg/dL — ABNORMAL LOW (ref 8.4–10.5)
Calcium: 8.4 mg/dL (ref 8.4–10.5)
Chloride: 104 mEq/L (ref 96–112)
Chloride: 98 mEq/L (ref 96–112)
Chloride: 100 mEq/L (ref 96–112)
Creatinine, Ser: 0.32 mg/dL — ABNORMAL LOW (ref 0.50–1.10)
Creatinine, Ser: 0.35 mg/dL — ABNORMAL LOW (ref 0.50–1.10)
Creatinine, Ser: 0.37 mg/dL — ABNORMAL LOW (ref 0.50–1.10)
GFR calc Af Amer: >90 mL/min (ref >90)
GFR calc Af Amer: >90 mL/min (ref >90)
GFR calc non Af Amer: >90 mL/min (ref >90)
GFR calc non Af Amer: >90 mL/min (ref >90)
GFR calc non Af Amer: >90 mL/min (ref >90)
GFR calc non Af Amer: >90 mL/min (ref >90)
GLUCOSE: 130 mg/dL — AB (ref 70–99)
GLUCOSE: 108 mg/dL — AB (ref 70–99)
Glucose, Bld: 111 mg/dL — ABNORMAL HIGH (ref 70–99)
Glucose, Bld: 150 mg/dL — ABNORMAL HIGH (ref 70–99)
POTASSIUM: 2.5 meq/L — AB (ref 3.7–5.3)
POTASSIUM: 2.5 meq/L — AB (ref 3.7–5.3)
POTASSIUM: 2.4 meq/L — AB (ref 3.7–5.3)
Potassium: 2.2 mEq/L — CL (ref 3.7–5.3)
SODIUM: 149 meq/L — AB (ref 137–147)
SODIUM: 146 meq/L (ref 137–147)
Sodium: 146 mEq/L (ref 137–147)
Sodium: 146 mEq/L (ref 137–147)

## 2013-08-13 LAB — MAGNESIUM
MAGNESIUM: 1.6 mg/dL (ref 1.5–2.5)
Magnesium: 1.4 mg/dL — ABNORMAL LOW (ref 1.5–2.5)

## 2013-08-13 LAB — GLUCOSE, CAPILLARY
GLUCOSE-CAPILLARY: 167 mg/dL — AB (ref 70–99)
GLUCOSE-CAPILLARY: 112 mg/dL — AB (ref 70–99)
Glucose-Capillary: 101 mg/dL — ABNORMAL HIGH (ref 70–99)
Glucose-Capillary: 128 mg/dL — ABNORMAL HIGH (ref 70–99)
Glucose-Capillary: 132 mg/dL — ABNORMAL HIGH (ref 70–99)
Glucose-Capillary: 143 mg/dL — ABNORMAL HIGH (ref 70–99)

## 2013-08-13 LAB — PHOSPHORUS
Phosphorus: 2.2 mg/dL — ABNORMAL LOW (ref 2.3–4.6)
Phosphorus: 2.4 mg/dL (ref 2.3–4.6)

## 2013-08-13 MED ORDER — PANTOPRAZOLE SODIUM 40 MG PO PACK
40.0000 mg | PACK | Freq: Every day | ORAL | Status: DC
  Administered 2013-08-14 – 2013-08-23 (×10): 40 mg
  Filled 2013-08-13 (×11): qty 20

## 2013-08-13 MED ORDER — POTASSIUM CHLORIDE 20 MEQ/15ML (10%) PO LIQD
ORAL | Status: AC
  Filled 2013-08-13: qty 30

## 2013-08-13 MED ORDER — POTASSIUM CHLORIDE 10 MEQ/50ML IV SOLN
INTRAVENOUS | Status: AC
  Filled 2013-08-13: qty 50

## 2013-08-13 MED ORDER — METOPROLOL TARTRATE 1 MG/ML IV SOLN
INTRAVENOUS | Status: AC
  Filled 2013-08-13: qty 5

## 2013-08-13 MED ORDER — VANCOMYCIN HCL IN DEXTROSE 1-5 GM/200ML-% IV SOLN
1000.0000 mg | Freq: Two times a day (BID) | INTRAVENOUS | Status: DC
Start: 2013-08-13 — End: 2013-08-16
  Administered 2013-08-13 – 2013-08-16 (×6): 1000 mg via INTRAVENOUS
  Filled 2013-08-13 (×7): qty 200

## 2013-08-13 MED ORDER — POTASSIUM CHLORIDE 10 MEQ/50ML IV SOLN
10.0000 meq | INTRAVENOUS | Status: AC
  Administered 2013-08-13: 10 meq via INTRAVENOUS

## 2013-08-13 MED ORDER — POTASSIUM CHLORIDE 10 MEQ/100ML IV SOLN
10.0000 meq | INTRAVENOUS | Status: AC
  Administered 2013-08-13 (×5): 10 meq via INTRAVENOUS
  Filled 2013-08-13 (×4): qty 100

## 2013-08-13 MED ORDER — METOPROLOL TARTRATE 1 MG/ML IV SOLN
2.5000 mg | INTRAVENOUS | Status: DC | PRN
  Administered 2013-08-13 (×2): 2.5 mg via INTRAVENOUS
  Filled 2013-08-13 (×2): qty 5

## 2013-08-13 MED ORDER — POTASSIUM CHLORIDE 10 MEQ/50ML IV SOLN
10.0000 meq | INTRAVENOUS | Status: AC
  Administered 2013-08-13 (×4): 10 meq via INTRAVENOUS
  Filled 2013-08-13 (×4): qty 50

## 2013-08-13 MED ORDER — POTASSIUM CHLORIDE 20 MEQ/15ML (10%) PO LIQD
40.0000 meq | Freq: Three times a day (TID) | ORAL | Status: DC
  Administered 2013-08-13 (×2): 40 meq
  Filled 2013-08-13 (×3): qty 30

## 2013-08-13 NOTE — Progress Notes (Signed)
Pt placed on 40% ATC per MD order to wean as tolerated.  Pt is tolerating well, HR 85, RR 20, Sats 97.  RT will continue to monitor.

## 2013-08-13 NOTE — Progress Notes (Signed)
Hemoglobin dropped from yesterday by 2 grams.  Will recheck later today.  Platelet count is remaining stable.  Kathryne Eriksson. Dahlia Bailiff, MD, [place] [phone] [facility]

## 2013-08-13 NOTE — Progress Notes (Signed)
PULMONARY / CRITICAL CARE MEDICINE   Name: [name] MRN: [id number] DOB: [birth year]    ADMISSION DATE:  07/29/2013 CONSULTATION DATE:  08/01/2013  REFERRING MD :  Surgery PRIMARY SERVICE: PCCM  CHIEF COMPLAINT:  Bowel obstruction  BRIEF PATIENT DESCRIPTION: 73 y/o female with PMH CAD, HTN, Barrett's esophagus, presented to [facility] ED 7/10 with LLQ pain. Found to have bowel obstruction. To OR for repair 7/13. Remained on vent post-op.    SIGNIFICANT EVENTS / STUDIES: 7/10 Admit, CT Abd - dilated prox colon w/ abrupt transition c/w with obstruction, diverticulitis vs colitis, Surgery c/s 7/12  Empiric Cipro/Flagyl, Cardiology consulted for pre-op evaluation, TPN per pharm 7/13 - Laparotomy, partial colectomy, colostomy, SB resection, repair of enterotomy, lysis of adhesions         - Hgb down to 6.8 >> imp to 10.8 s/p 2u PRBC 7/14 - Extubated, required re-intubation due to hypoxemic resp failure, hypotension, concern for aspiration 7/15 - 2D echo>>> severely dilated LA, normal systolic function   5/42 - Considered ARDS (unclear etiology, ?aspiration), CXR (persistent b/l edema), cont pressors, stable low PCT 1.9 7/17 - CT abd >no bleeding , no abscess, sm fluid in abd/pelvis  7/17 - Ven Dopp>+DVT Rt axillary vein 7/23 -CT Head (subcentimeter focal parenchymal hemorrhage - no SAH or subdural, no acute ischemic change)         - CT chest w/ contrast (severe multilobar pneumonia, moderate b/l pleural effusions, also favor chronicity         - CT Abd/Pelvis (IV, PO) (post-op changes, s/p L-hemicolectomy, transverse colon markedly thickened, unclear if related to acute inflammation/infection vs secondary distention/post-op edema) 7/24 - Trach (DF)        LINES / TUBES: ETT 7/13 >> 7/14, 7/14 >>7/24 Trach (DF) 7/24>>> R IJ CVL 7/14 >>   CULTURES: Blood x 2, 7/15 >> negative Respiratory 7/15 >> few Candida Repeat Blood x2, 7/23 >>> BAL 7/24>>>  ANTIBIOTICS: Ceftriaxone 7/10 >>>  7/14 Flagyl 7/10 >>> 7/14 Vancomycin 7/14 >>> 7/16 Fluconazole 7/10 >>> 7/16 Zosyn 7/14 >> 7/21 Vancomycin 7/23 >>> Primaxin 7/23 >>> Micafungin 7/23 >>>7/24  SUBJECTIVE/ OVERNIGHT:  S/p trach 7/24.  Mental status improved. Pressors needs improved. Remains on lasix gtt, hypokalemia.   VITAL SIGNS: Temp:  [93.3 F (34.1 C)-99.8 F (37.7 C)] 98.4 F (36.9 C) (07/25 0739) Pulse Rate:  [69-145] 78 (07/25 0749) Resp:  [10-31] 16 (07/25 0749) BP: (60-151)/(21-107) 151/51 mmHg (07/25 0745) SpO2:  [96 %-100 %] 99 % (07/25 0749) FiO2 (%):  [40 %-50 %] 40 % (07/25 0749) Weight:  [160 lb 4.4 oz (72.7 kg)] 160 lb 4.4 oz (72.7 kg) (07/25 0400) HEMODYNAMICS: CVP:  [0 mmHg-5 mmHg] 5 mmHg VENTILATOR SETTINGS: Vent Mode:  [-] CPAP;PSV FiO2 (%):  [40 %-50 %] 40 % Set Rate:  [10 bmp-20 bmp] 10 bmp Vt Set:  [380 mL] 380 mL PEEP:  [8 cmH20] 8 cmH20 Pressure Support:  [0 cmH20-5 cmH20] 5 cmH20 Plateau Pressure:  [15 HCW23-76 cmH20] 18 cmH20 INTAKE / OUTPUT: Intake/Output     07/24 0701 - 07/25 0700 07/25 0701 - 07/26 0700   P.O.     I.V. (mL/kg) 1707.1 (23.5)    Blood 625    NG/GT 150    IV Piggyback 900    Total Intake(mL/kg) 3382.1 (46.5)    Urine (mL/kg/hr) 7535 (4.3) 350 (3.7)   Stool 50 (0)    Total Output 7585 350   Net -4202.9 -350  PHYSICAL EXAMINATION: General:  On vent, NAD Neuro:  Sedated RASS 0, opens eyes to voice, tracks, moves upper ext non purposeful  Cardiovascular: Tachycardic, regular rhythm, no murmurs Lungs: resps even non labored on PS 5/5, Coarse bilateral, bibasilar crackles. Abdomen:  Soft, abd dressing in place. LLQ colostomy functioning (oozing, melena), +active BS Ext:  gen edema, 2+ BLE   LABS:  CBC  Recent Labs Lab 08/11/13 0500 08/12/13 0400 08/12/13 1840  WBC 17.5* 18.1* 23.5*  HGB 7.2* 6.0* 9.8*  HCT 22.5* 19.9* 30.6*  PLT 277 257 357   Coag's  Recent Labs Lab 08/11/13 1200  APTT 64*  INR 1.20   BMET  Recent Labs Lab  08/12/13 1137 08/12/13 2333 08/13/13 0633  NA 145 146 149*  K 4.0 2.4* 2.5*  CL 106 101 104  CO2 26 32 37*  BUN 25* 20 19  CREATININE 0.34* 0.35* 0.37*  GLUCOSE 144* 150* 130*   Electrolytes  Recent Labs Lab 08/10/13 0500  08/12/13 1137 08/12/13 2333 08/13/13 0633  CALCIUM 7.5*  < > 8.6 8.4 7.7*  MG 2.1  --  2.0 1.6  --   PHOS 2.2*  --  2.7 2.4  --   < > = values in this interval not displayed. Sepsis Markers  Recent Labs Lab 08/09/13 2230 08/10/13 0500 08/11/13 0500 08/12/13 0400  LATICACIDVEN 2.0  --  1.7 1.2  PROCALCITON <0.10 <0.10 <0.10  --    ABG  Recent Labs Lab 08/10/13 0340 08/11/13 0954 08/11/13 1336  PHART 7.497* 7.557* 7.497*  PCO2ART 41.1 32.1* 36.1  PO2ART 152.0* 52.0* 53.0*   Liver Enzymes  Recent Labs Lab 08/07/13 0420 08/08/13 0500  AST 36 43*  ALT 21 30  ALKPHOS 62 72  BILITOT 0.3 0.4  ALBUMIN 1.7* 2.0*   Cardiac Enzymes  Recent Labs Lab 08/08/13 1754  TROPONINI <0.30   Glucose  Recent Labs Lab 08/12/13 1238 08/12/13 1617 08/12/13 1927 08/13/13 0001 08/13/13 0419 08/13/13 0710  GLUCAP 151* 144* 143* 143* 167* 128*    CXR - no new cxr 7/25   ASSESSMENT / PLAN:  PULMONARY A: Acute respiratory failure, post op - now s/p trach 7/25 ARDS, unclear etiology, ?aspiration - improved.  HCAP, concern for re-infection  P:   Cont vent support with daily SBT as tol  Cont diuresis as below  F/u CXR in am  abx as above   CARDIOVASCULAR A:  Shock, Septic - Pressor needs improved.  Preserved EF, no evidence cardiogenic shock.  Tachycardia Volume overload  H/o CAD, HTN DVT Rt axillary vein - RUE PICC removed 7/20, anticoagulation off with small ICH P:  Cont wean pressors to off as able  Continue stress dose steroids  Cont lasix gtt -- neg 4L last 24 hours but remains net POS >16L this admit  Monitor CVP hold BB, while on pressors Hold plavix, lipitor, ASA   RENAL A:   Non-gap Metabolic Acidosis -  Resolved Severe hypokalemia - Hypervolemia P:   KVO IVF  replete K aggressively  Check mg, phos  DC furosemide gtt until we can get K+ repleted  GASTROINTESTINAL A:   Suspected colitis with colonic obstruction/SBO s/p partial colectomy with new colostomy 7/13 Ileus, post-op - Resolved, functioning ostomy Hx of Barrett's esophagus Concern oozing anastomosis  P:   Surgery following  Nutrition per surgery - strict NPO - cont hold tube feeds until bo further bleeding  Wound/ostomy care  Continue PPI    HEMATOLOGIC A:   Very small  ICH Suspected new post-op GIB anastomosis - Likely source of dec Hgb DVT Rt axillary vein  RUE PICC removed 7/20 VTE ppx P:  Hold Heparin given concern for GIB and ICH F/u cbc  SCD's Transfuse for Hb < 7 or bleeding - avoid if able with volume overload  F/u coags    INFECTIOUS A:   Colitis, s/p partial colectomy Presumed HCAP vs vap, ?aspiration PNA - No obvious abd source on CT P:   Cont abx as above  F/u repeat cultures/ BAL  Afebrile but WBC trend up - cont broad spectrum abx   ENDOCRINE A: Hyperglycemia  No hx DM P:   Resistant SSI Continue stress dose steroids   NEUROLOGIC A:   Acute encephalopathy - improved  Delirium  Small small ICH on CT P:   RASS goal: 0 Fentanyl gtt Continued Risperdal 0.5mg  qhs PT/OT when able     GLOBAL:  Improving shock. S/p trach 7/24.  Good output with lasix gtt.  Mental status slightly improved.  Concern for anastomotic oozing and new HCAP. Cont broad spectrum abx, lasix gtt, low dose pressors.  F/u cbc.  Poor overall prognosis, DNR, but does seem to be making some slow progress.    Nickolas Madrid, NP 08/13/2013  8:54 AM Pager: [phone] or [phone]  *Care during the described time interval was provided by me and/or other providers on the critical care team. I have reviewed this patient's available data, including medical history, events of note, physical examination and test  results as part of my evaluation.  40 mins CCM time  Merton Border, MD ; [facility] [phone].  After 5:30 PM or weekends, call [phone]

## 2013-08-13 NOTE — Progress Notes (Signed)
Patient ID: [name], female   DOB: [birth year], 73 y.o.   MRN: [id number] 12 Days Post-Op  Subjective: Pt more awake.  On pressure support and trying to wean.  Still with melena from ostomy  Objective: Vital signs in last 24 hours: Temp:  [93.3 F (34.1 C)-99.8 F (37.7 C)] 98.4 F (36.9 C) (07/25 0739) Pulse Rate:  [69-145] 92 (07/25 0830) Resp:  [10-31] 17 (07/25 0830) BP: (60-151)/(21-107) 94/64 mmHg (07/25 0830) SpO2:  [96 %-100 %] 98 % (07/25 0830) FiO2 (%):  [40 %-50 %] 40 % (07/25 0749) Weight:  [160 lb 4.4 oz (72.7 kg)] 160 lb 4.4 oz (72.7 kg) (07/25 0400) Last BM Date: 08/12/13  Intake/Output from previous day: 07/24 0701 - 07/25 0700 In: 3382.1 [I.V.:1707.1; Blood:625; NG/GT:150; IV [id number]:494] Out: 4967 [Urine:7535; Stool:50] Intake/Output this shift: Total I/O In: 42.5 [I.V.:42.5] Out: 700 [Urine:700]  PE: Abd: soft, inferior portion of wound closing.  Middle portion still has liquified fascia with non-healing of her wound.  Can stick finger through fascia, but sutures are still intact.  Ostomy with melanotic liquid stool.  Lab Results:   Recent Labs  08/12/13 0400 08/12/13 1840  WBC 18.1* 23.5*  HGB 6.0* 9.8*  HCT 19.9* 30.6*  PLT 257 357   BMET  Recent Labs  08/12/13 2333 08/13/13 0633  NA 146 149*  K 2.4* 2.5*  CL 101 104  CO2 32 37*  GLUCOSE 150* 130*  BUN 20 19  CREATININE 0.35* 0.37*  CALCIUM 8.4 7.7*   PT/INR  Recent Labs  08/11/13 1200  LABPROT 15.2  INR 1.20   CMP     Component Value Date/Time   NA 149* 08/13/2013 0633   K 2.5* 08/13/2013 0633   CL 104 08/13/2013 0633   CO2 37* 08/13/2013 0633   GLUCOSE 130* 08/13/2013 0633   BUN 19 08/13/2013 0633   CREATININE 0.37* 08/13/2013 0633   CALCIUM 7.7* 08/13/2013 0633   PROT 5.0* 08/08/2013 0500   ALBUMIN 2.0* 08/08/2013 0500   AST 43* 08/08/2013 0500   ALT 30 08/08/2013 0500   ALKPHOS 72 08/08/2013 0500   BILITOT 0.4 08/08/2013 0500   GFRNONAA >90 08/13/2013 0633   GFRAA  >90 08/13/2013 0633   Lipase     Component Value Date/Time   LIPASE 9* 07/29/2013 1605       Studies/Results: Ct Head Wo Contrast  08/11/2013   CLINICAL DATA:  Mental status change in the setting of hypotension. The patient is 10 days post- op for colectomy  EXAM: CT HEAD WITHOUT CONTRAST  TECHNIQUE: Contiguous axial images were obtained from the base of the skull through the vertex without intravenous contrast.  COMPARISON:  Noncontrast CT scan of the brain of April 30, 2013  FINDINGS: There is a tiny focus of increased density in the left frontal cortex demonstrated on image 25. This measures approximately 5 x 8 mm and may reflect a focal parenchymal hemorrhage. No similar finding is seen elsewhere. There is no subdural or subarachnoid or intraventricular hemorrhage. No acute ischemic changes demonstrated. The ventricles are normal in size and position. The cerebellum and brainstem are unremarkable.  The observed paranasal sinuses exhibit no air-fluid levels. There is fluid within the mastoid air cells bilaterally. The trachea is intubated. Adnexal gastric tube is present via the left nasal passage.  IMPRESSION: 1. There is a subcentimeter hyperdensity in the left frontal lobe consistent with a focal parenchymal hemorrhage. There is no subarachnoid or subdural hemorrhage. 2. There is no  evidence of acute ischemic change. 3. No signs increased intracranial pressure are demonstrated. 4. Critical Value/emergent results were called by telephone at the time of interpretation on 08/11/2013 at 5:37 pm to [facility], RN,who verbally acknowledged these results.   Electronically Signed   By: David  Martinique   On: 08/11/2013 17:41   Ct Chest W Contrast  08/11/2013   CLINICAL DATA:  Probable septic shock. Delirium. Hypotension. Possible pneumonia.  EXAM: CT CHEST, ABDOMEN, AND PELVIS WITH CONTRAST  TECHNIQUE: Multidetector CT imaging of the chest, abdomen and pelvis was performed following the standard protocol  during bolus administration of intravenous contrast.  CONTRAST:  70mL OMNIPAQUE IOHEXOL 300 MG/ML  SOLN  COMPARISON:  CT of the abdomen and pelvis 08/05/2013. Chest CT 04/30/2013.  FINDINGS: CT CHEST FINDINGS  Mediastinum: Patient is intubated, with the tip of the endotracheal tube approximately 3 cm above the carina. Nasogastric tube extends into the proximal duodenum. Right internal jugular central venous catheter with tip terminating at the superior cavoatrial junction. Mild cardiomegaly. There is no significant pericardial fluid, thickening or pericardial calcification. There is atherosclerosis of the thoracic aorta, the great vessels of the mediastinum and the coronary arteries, including calcified atherosclerotic plaque in the left main, left anterior descending, left circumflex and right coronary arteries. Multiple borderline enlarged and mildly enlarged mediastinal lymph nodes, largest of which are in the prevascular and lower right paratracheal stations measuring 1 cm in short axis. Esophagus is unremarkable in appearance.  Lungs/Pleura: Extensive multifocal airspace consolidation, septal thickening and architectural distortion, presumably a multilobar pneumonia. Scattered areas of cylindrical and varicose bronchiectasis. Moderate bilateral pleural effusions layering dependently (right greater than left).  Musculoskeletal: There are no aggressive appearing lytic or blastic lesions noted in the visualized portions of the skeleton.  CT ABDOMEN AND PELVIS FINDINGS  Abdomen/Pelvis: Status post cholecystectomy. The appearance of the liver, pancreas, spleen, bilateral adrenal glands and bilateral kidneys is unremarkable. Trace volume of ascites. No pneumoperitoneum. No pathologic distention of small bowel. Status post left hemicolectomy with Hartmann's pouch. Transverse colostomy in the left upper quadrant. Profound thickening of the transverse colon, which is nearly completely decompressed. No pathologic  distention of small bowel. No definite lymphadenopathy identified in the abdomen or pelvis. Extensive atherosclerosis throughout the abdominal and pelvic vasculature, without evidence of aneurysm. Status post hysterectomy. Ovaries are not confidently identified may be surgically absent or atrophic. Foley balloon catheter present within the lumen of the urinary bladder. Small amount of gas non dependently in the lumen of the urinary bladder is presumably iatrogenic.  Musculoskeletal: Healing midline abdominal wound via secondary intention. Diffuse body wall edema. There are no aggressive appearing lytic or blastic lesions noted in the visualized portions of the skeleton.  IMPRESSION: 1. Severe multilobar pneumonia with moderate bilateral pleural effusions which are simple in appearance layering dependently. 2. Postoperative changes related to recent left hemicolectomy with Hartmann's pouch and transverse colostomy. The transverse colon appears markedly thickened. Whether or not this is related to acute inflammation/infection, or is simply secondary to under distention of this portion of the colon and some postoperative edema is uncertain. 3. Trace volume of ascites and diffuse body wall edema. 4. Atherosclerosis, including left main and 3 vessel coronary artery disease. Assessment for potential risk factor modification, dietary therapy or pharmacologic therapy may be warranted, if clinically indicated. 5. Additional findings, as above.   Electronically Signed   By: Vinnie Langton M.D.   On: 08/11/2013 17:49   Ct Abdomen Pelvis W Contrast  08/11/2013  CLINICAL DATA:  Probable septic shock. Delirium. Hypotension. Possible pneumonia.  EXAM: CT CHEST, ABDOMEN, AND PELVIS WITH CONTRAST  TECHNIQUE: Multidetector CT imaging of the chest, abdomen and pelvis was performed following the standard protocol during bolus administration of intravenous contrast.  CONTRAST:  73mL OMNIPAQUE IOHEXOL 300 MG/ML  SOLN  COMPARISON:   CT of the abdomen and pelvis 08/05/2013. Chest CT 04/30/2013.  FINDINGS: CT CHEST FINDINGS  Mediastinum: Patient is intubated, with the tip of the endotracheal tube approximately 3 cm above the carina. Nasogastric tube extends into the proximal duodenum. Right internal jugular central venous catheter with tip terminating at the superior cavoatrial junction. Mild cardiomegaly. There is no significant pericardial fluid, thickening or pericardial calcification. There is atherosclerosis of the thoracic aorta, the great vessels of the mediastinum and the coronary arteries, including calcified atherosclerotic plaque in the left main, left anterior descending, left circumflex and right coronary arteries. Multiple borderline enlarged and mildly enlarged mediastinal lymph nodes, largest of which are in the prevascular and lower right paratracheal stations measuring 1 cm in short axis. Esophagus is unremarkable in appearance.  Lungs/Pleura: Extensive multifocal airspace consolidation, septal thickening and architectural distortion, presumably a multilobar pneumonia. Scattered areas of cylindrical and varicose bronchiectasis. Moderate bilateral pleural effusions layering dependently (right greater than left).  Musculoskeletal: There are no aggressive appearing lytic or blastic lesions noted in the visualized portions of the skeleton.  CT ABDOMEN AND PELVIS FINDINGS  Abdomen/Pelvis: Status post cholecystectomy. The appearance of the liver, pancreas, spleen, bilateral adrenal glands and bilateral kidneys is unremarkable. Trace volume of ascites. No pneumoperitoneum. No pathologic distention of small bowel. Status post left hemicolectomy with Hartmann's pouch. Transverse colostomy in the left upper quadrant. Profound thickening of the transverse colon, which is nearly completely decompressed. No pathologic distention of small bowel. No definite lymphadenopathy identified in the abdomen or pelvis. Extensive atherosclerosis  throughout the abdominal and pelvic vasculature, without evidence of aneurysm. Status post hysterectomy. Ovaries are not confidently identified may be surgically absent or atrophic. Foley balloon catheter present within the lumen of the urinary bladder. Small amount of gas non dependently in the lumen of the urinary bladder is presumably iatrogenic.  Musculoskeletal: Healing midline abdominal wound via secondary intention. Diffuse body wall edema. There are no aggressive appearing lytic or blastic lesions noted in the visualized portions of the skeleton.  IMPRESSION: 1. Severe multilobar pneumonia with moderate bilateral pleural effusions which are simple in appearance layering dependently. 2. Postoperative changes related to recent left hemicolectomy with Hartmann's pouch and transverse colostomy. The transverse colon appears markedly thickened. Whether or not this is related to acute inflammation/infection, or is simply secondary to under distention of this portion of the colon and some postoperative edema is uncertain. 3. Trace volume of ascites and diffuse body wall edema. 4. Atherosclerosis, including left main and 3 vessel coronary artery disease. Assessment for potential risk factor modification, dietary therapy or pharmacologic therapy may be warranted, if clinically indicated. 5. Additional findings, as above.   Electronically Signed   By: Vinnie Langton M.D.   On: 08/11/2013 17:49   Chest Portable 1 View To Assess Tube Placement And Rule-out Pneumothorax  08/12/2013   CLINICAL DATA:  73 year old female with tracheostomy placement. Initial encounter.  EXAM: PORTABLE CHEST - 1 VIEW  COMPARISON:  0519 hr the same day and earlier.  FINDINGS: Portable AP semi upright view at 1415 hrs. ET tube is been replaced with a tracheostomy tube which projects over the tracheal air column, no adverse features identified.  Stable right IJ central line. Stable cardiac size and mediastinal contours. No pneumothorax.   Enteric tube remains in place. Pulmonary ventilation not significantly changed.  IMPRESSION: 1. ETT replaced with tracheostomy tube. No pneumothorax or adverse features. 2. Otherwise stable chest.   Electronically Signed   By: Lars Pinks M.D.   On: 08/12/2013 14:23   Dg Chest Port 1 View  08/12/2013   CLINICAL DATA:  73 year old female intubated, multilobar pneumonia, septic shock. Initial encounter. Recent hemicolectomy.  EXAM: PORTABLE CHEST - 1 VIEW  COMPARISON:  Chest CT 08/11/2013 and earlier.  FINDINGS: Portable AP semi upright view at 0519 hr. Stable endotracheal tube. Stable enteric tube. Stable right IJ central line.  Stable cardiac size and mediastinal contours.  Combination of bilateral veiling and airspace pulmonary opacity. No pneumothorax. No significant change in ventilation. Upper lobes less effected as before.  IMPRESSION: 1.  Stable lines and tubes. 2. Stable chest with bilateral pneumonia and pleural effusions.   Electronically Signed   By: Lars Pinks M.D.   On: 08/12/2013 07:24    Anti-infectives: Anti-infectives   Start     Dose/Rate Route Frequency Ordered Stop   08/11/13 1400  vancomycin (VANCOCIN) IVPB 750 mg/150 ml premix     750 mg 150 mL/hr over 60 Minutes Intravenous Every 12 hours 08/11/13 1138     08/11/13 1300  imipenem-cilastatin (PRIMAXIN) 500 mg in sodium chloride 0.9 % 100 mL IVPB     500 mg 200 mL/hr over 30 Minutes Intravenous 3 times per day 08/11/13 1138     08/11/13 1230  micafungin (MYCAMINE) 100 mg in sodium chloride 0.9 % 100 mL IVPB  Status:  Discontinued     100 mg 100 mL/hr over 1 Hours Intravenous Daily 08/11/13 1138 08/12/13 1132   08/07/13 1400  piperacillin-tazobactam (ZOSYN) IVPB 3.375 g     3.375 g 12.5 mL/hr over 240 Minutes Intravenous 3 times per day 08/07/13 0921 08/10/13 0101   08/03/13 1800  vancomycin (VANCOCIN) IVPB 750 mg/150 ml premix  Status:  Discontinued     750 mg 150 mL/hr over 60 Minutes Intravenous Every 12 hours 08/03/13 0440  08/04/13 0910   08/03/13 0500  vancomycin (VANCOCIN) 1,250 mg in sodium chloride 0.9 % 250 mL IVPB     1,250 mg 166.7 mL/hr over 90 Minutes Intravenous  Once 08/03/13 0440 08/03/13 0646   08/03/13 0500  piperacillin-tazobactam (ZOSYN) IVPB 3.375 g  Status:  Discontinued     3.375 g 12.5 mL/hr over 240 Minutes Intravenous 3 times per day 08/03/13 0440 08/07/13 0921   08/01/13 1245  metroNIDAZOLE (FLAGYL) IVPB 500 mg     500 mg 100 mL/hr over 60 Minutes Intravenous To Surgery 08/01/13 1238 08/01/13 1300   07/31/13 0800  cefoTEtan (CEFOTAN) 2 g in dextrose 5 % 50 mL IVPB     2 g 100 mL/hr over 30 Minutes Intravenous On call to O.R. 07/31/13 0741 08/01/13 0559   07/31/13 0800  cefTRIAXone (ROCEPHIN) 2 g in dextrose 5 % 50 mL IVPB  Status:  Discontinued    Comments:  Pharmacy may adjust dosing strength / duration / interval for maximal efficacy   2 g 100 mL/hr over 30 Minutes Intravenous Every 24 hours 07/31/13 0744 08/03/13 0405   07/29/13 2100  ciprofloxacin (CIPRO) IVPB 400 mg  Status:  Discontinued     400 mg 200 mL/hr over 60 Minutes Intravenous Every 12 hours 07/29/13 2058 07/31/13 0744   07/29/13 2100  metroNIDAZOLE (FLAGYL) IVPB 500 mg  Status:  Discontinued     500 mg 100 mL/hr over 60 Minutes Intravenous Every 8 hours 07/29/13 2058 08/03/13 0405   07/29/13 2100  fluconazole (DIFLUCAN) IVPB 200 mg  Status:  Discontinued     200 mg 100 mL/hr over 60 Minutes Intravenous Every 24 hours 07/29/13 2058 08/04/13 0911   07/29/13 1930  ciprofloxacin (CIPRO) IVPB 400 mg     400 mg 200 mL/hr over 60 Minutes Intravenous  Once 07/29/13 1915 07/29/13 2031   07/29/13 1930  metroNIDAZOLE (FLAGYL) IVPB 500 mg  Status:  Discontinued     500 mg 100 mL/hr over 60 Minutes Intravenous  Once 07/29/13 1915 07/29/13 2107       Assessment/Plan  1. POD 12, EXPLORATORY LAPAROTOMY (N/A)  PARTIAL COLECTOMY (N/A)  COLOSTOMY (N/A)  SMALL BOWEL RESECTION (N/A)  2. Postoperative ileus, resolved  3.  Ventilator dependent respiratory failure  4. ARDS  5. Leukocytosis  6. Multilobar PNA  7. Small parenchymal hemorrhage  8. ABL anemia, secondary to GI bleed  Plan: 1. hgb pending.  Still with blood in ostomy.  Cont to hold TFs for now. 2. Cont dressing changes to abdominal wound    LOS: 15 days    Akelia Husted E 08/13/2013, 8:52 AM Pager: [phone]

## 2013-08-13 NOTE — Progress Notes (Signed)
[name] NOTE  Pharmacy Consult for Vancomycin + Primaxin Indication: Empiric sepsis coverage  Allergies  Allergen Reactions  . Pregabalin Swelling    Tongue swelling  . Sulfonamide Derivatives Swelling    Childhood reaction    Patient Measurements: Height: 5' (152.4 cm) Weight: 160 lb 4.4 oz (72.7 kg) IBW/kg (Calculated) : 45.5  Vital Signs: Temp: 98.6 F (37 C) (07/25 1143) Temp src: Oral (07/25 1143) BP: 130/68 mmHg (07/25 1415) Pulse Rate: 74 (07/25 1415)  Labs:  Recent Labs  08/10/13 2230  08/11/13 0500 08/11/13 1200 08/12/13 0400  08/12/13 1840 08/12/13 2333 08/13/13 0633 08/13/13 0640 08/13/13 1137  HGB  --   < > 7.2*  --  6.0*  --  9.8*  --   --  7.8*  --   HCT  --   < > 22.5*  --  19.9*  --  30.6*  --   --  24.3*  --   PLT  --   < > 277  --  257  --  357  --   --  237  --   APTT  --   --   --  64*  --   --   --   --   --   --   --   LABPROT  --   --   --  15.2  --   --   --   --   --   --   --   INR  --   --   --  1.20  --   --   --   --   --   --   --   HEPARINUNFRC 0.59  --  0.59  --   --   --   --   --   --   --   --   CREATININE  --   < > 0.38*  --  0.32*  < >  --  0.35* 0.37*  --  0.36*  < > = values in this interval not displayed.  Estimated Creatinine Clearance: 54.9 ml/min (by C-G formula based on Cr of 0.36).   Assessment: 35 YOF who presented to the [facility] on 7/10 with LLQ pain and was found to have a bowel obstruction, s/p repair on 7/13. The patient has received several courses of antibiotics this admission - most recently on 7/23 in the setting of hypotension and B/L PNA on CXR.  The patient continues on Primaxin + Vanc for empiric r/o sepsis/PNA coverage. BAL was sent 7/24 and is still pending. Renal function remains stable - SCr 0.34 (poor muscle mass), UOP/24h: 0.6 ml/kg/hr. Doses remain appropriate.   Her Vancomycin trough is 12.3, which is slightly subtherapeutic for treatment of pneumonia.  Flagyl 7/11 >>  7/14 CTX 7/12 >> 7/14 Fluconazole 7/11 >>7/16 (rash thought to be d/t this) Vancomycin 7/15 >> 7/16; restart 7/23 Zosyn 7/15 >> 7/21 Mica 7/23 >> 7/24 Imi 7/23 >>  7/23 BCx >> ngtd 7/13 MRSA >> neg 7/15 BCx >> NG 7/15 RCx >> few CA  Goal of Therapy:  Vancomycin level of 15-20 mcg/ml Proper antibiotics for infection/cultures adjusted for renal/hepatic function    Plan:  1. Increase Vancomycin to 1gm IV q12h 2. Continue Primaxin 500 mg IV every 8 hours 3. Will continue to follow renal function, culture results, LOT, and antibiotic de-escalation plans   Legrand Como, Pharm.D., BCPS, AAHIVP Clinical Pharmacist Phone: [phone] or [phone] 08/13/2013, 2:34 PM

## 2013-08-13 NOTE — Progress Notes (Signed)
CRITICAL VALUE ALERT  Critical value received:  K-2.4  Date of notification:  08/13/2013  Time of notification:  12:58 AM  Critical value read back:Yes.    Nurse who received alert:  Dillard Essex  MD notified (1st page):  Dr. Sherril Cong  Time of first page:  12:59 AM  MD notified (2nd page):  Time of second page:  Responding MD:  Dr. Sherril Cong  Time MD responded:  12:59 AM

## 2013-08-13 NOTE — Progress Notes (Signed)
CRITICAL VALUE ALERT  Critical value received:  K+   2.5  Date of notification:  [id number]  Time of notification:  1930  Critical value read back:Yes.    Nurse who received alert:  G. Lindalou Hose, RN  MD notified (1st page):  Dr. Tamala Julian  Time of first page:  2200  MD notified (2nd page):  Time of second page:  Responding MD:  Dr. Tamala Julian  Time MD responded:  2200

## 2013-08-13 NOTE — Progress Notes (Signed)
CRITICAL VALUE ALERT  Critical value received:  K 2.5  Date of notification:  08/13/13  Time of notification:  0744   Critical value read back: yes  Nurse who received alert:  Olevia Perches, RN  Responding MD:  Tiffany Kocher, NP  Time of first page:  Verbally notified [phone]  Time MD responded:  Verbal order [phone]

## 2013-08-14 ENCOUNTER — Inpatient Hospital Stay (HOSPITAL_COMMUNITY): Payer: Medicare HMO

## 2013-08-14 DIAGNOSIS — R601 Generalized edema: Secondary | ICD-10-CM | POA: Diagnosis present

## 2013-08-14 DIAGNOSIS — J9589 Other postprocedural complications and disorders of respiratory system, not elsewhere classified: Secondary | ICD-10-CM

## 2013-08-14 LAB — GLUCOSE, CAPILLARY
GLUCOSE-CAPILLARY: 136 mg/dL — AB (ref 70–99)
GLUCOSE-CAPILLARY: 156 mg/dL — AB (ref 70–99)
GLUCOSE-CAPILLARY: 172 mg/dL — AB (ref 70–99)
Glucose-Capillary: 99 mg/dL (ref 70–99)
Glucose-Capillary: 118 mg/dL — ABNORMAL HIGH (ref 70–99)
Glucose-Capillary: 111 mg/dL — ABNORMAL HIGH (ref 70–99)

## 2013-08-14 LAB — BASIC METABOLIC PANEL
ANION GAP: 10 (ref 5–15)
ANION GAP: 9 (ref 5–15)
Anion gap: 12 (ref 5–15)
BUN: 15 mg/dL (ref 6–23)
BUN: 15 mg/dL (ref 6–23)
BUN: 18 mg/dL (ref 6–23)
CALCIUM: 7.9 mg/dL — AB (ref 8.4–10.5)
CHLORIDE: 105 meq/L (ref 96–112)
CO2: 33 mEq/L — ABNORMAL HIGH (ref 19–32)
CO2: 35 mEq/L — ABNORMAL HIGH (ref 19–32)
CO2: 35 mEq/L — ABNORMAL HIGH (ref 19–32)
Calcium: 8.1 mg/dL — ABNORMAL LOW (ref 8.4–10.5)
Calcium: 7.8 mg/dL — ABNORMAL LOW (ref 8.4–10.5)
Chloride: 100 mEq/L (ref 96–112)
Chloride: 103 mEq/L (ref 96–112)
Creatinine, Ser: 0.34 mg/dL — ABNORMAL LOW (ref 0.50–1.10)
Creatinine, Ser: 0.35 mg/dL — ABNORMAL LOW (ref 0.50–1.10)
Creatinine, Ser: 0.35 mg/dL — ABNORMAL LOW (ref 0.50–1.10)
GFR calc Af Amer: >90 mL/min (ref >90)
GFR calc non Af Amer: >90 mL/min (ref >90)
GLUCOSE: 136 mg/dL — AB (ref 70–99)
Glucose, Bld: 86 mg/dL (ref 70–99)
Glucose, Bld: 151 mg/dL — ABNORMAL HIGH (ref 70–99)
POTASSIUM: 3.5 meq/L — AB (ref 3.7–5.3)
Potassium: 2.7 mEq/L — CL (ref 3.7–5.3)
Potassium: 3.1 mEq/L — ABNORMAL LOW (ref 3.7–5.3)
SODIUM: 147 meq/L (ref 137–147)
SODIUM: 147 meq/L (ref 137–147)
Sodium: 148 mEq/L — ABNORMAL HIGH (ref 137–147)

## 2013-08-14 LAB — CULTURE, BAL-QUANTITATIVE W GRAM STAIN

## 2013-08-14 LAB — CBC
HCT: 27 % — ABNORMAL LOW (ref 36.0–46.0)
HCT: 25 % — ABNORMAL LOW (ref 36.0–46.0)
HEMOGLOBIN: 8.1 g/dL — AB (ref 12.0–15.0)
Hemoglobin: 8.6 g/dL — ABNORMAL LOW (ref 12.0–15.0)
MCH: 28.6 pg (ref 26.0–34.0)
MCH: 29.2 pg (ref 26.0–34.0)
MCHC: 31.9 g/dL (ref 30.0–36.0)
MCHC: 32.4 g/dL (ref 30.0–36.0)
MCV: 89.7 fL (ref 78.0–100.0)
MCV: 90.3 fL (ref 78.0–100.0)
PLATELETS: 272 10*3/uL (ref 150–400)
Platelets: 305 10*3/uL (ref 150–400)
RBC: 3.01 MIL/uL — AB (ref 3.87–5.11)
RBC: 2.77 MIL/uL — ABNORMAL LOW (ref 3.87–5.11)
RDW: 19.5 % — ABNORMAL HIGH (ref 11.5–15.5)
RDW: 19.9 % — ABNORMAL HIGH (ref 11.5–15.5)
WBC: 18.8 10*3/uL — AB (ref 4.0–10.5)
WBC: 15.9 10*3/uL — AB (ref 4.0–10.5)

## 2013-08-14 LAB — CULTURE, BAL-QUANTITATIVE: Colony Count: >=100000

## 2013-08-14 MED ORDER — INSULIN GLARGINE 100 UNIT/ML ~~LOC~~ SOLN
10.0000 [IU] | Freq: Every day | SUBCUTANEOUS | Status: DC
  Filled 2013-08-14: qty 0.1

## 2013-08-14 MED ORDER — FUROSEMIDE 10 MG/ML IJ SOLN
40.0000 mg | Freq: Two times a day (BID) | INTRAMUSCULAR | Status: AC
  Administered 2013-08-14: 40 mg via INTRAVENOUS
  Filled 2013-08-14: qty 4

## 2013-08-14 MED ORDER — VITAL HIGH PROTEIN PO LIQD
1000.0000 mL | ORAL | Status: DC
  Administered 2013-08-14 – 2013-08-15 (×2): 1000 mL
  Filled 2013-08-14 (×3): qty 1000

## 2013-08-14 MED ORDER — POTASSIUM PHOSPHATES 15 MMOLE/5ML IV SOLN
30.0000 mmol | Freq: Once | INTRAVENOUS | Status: AC
  Administered 2013-08-14: 30 mmol via INTRAVENOUS
  Filled 2013-08-14: qty 10

## 2013-08-14 MED ORDER — POTASSIUM CHLORIDE 10 MEQ/100ML IV SOLN
10.0000 meq | INTRAVENOUS | Status: AC
  Administered 2013-08-14 (×4): 10 meq via INTRAVENOUS
  Filled 2013-08-14 (×4): qty 100

## 2013-08-14 MED ORDER — POTASSIUM CHLORIDE 20 MEQ/15ML (10%) PO LIQD
40.0000 meq | Freq: Two times a day (BID) | ORAL | Status: DC
  Filled 2013-08-14 (×3): qty 30

## 2013-08-14 MED ORDER — POTASSIUM CHLORIDE 20 MEQ/15ML (10%) PO LIQD
40.0000 meq | Freq: Two times a day (BID) | ORAL | Status: DC
  Administered 2013-08-14 (×2): 40 meq via ORAL
  Filled 2013-08-14 (×5): qty 30

## 2013-08-14 MED ORDER — POTASSIUM CHLORIDE 20 MEQ/15ML (10%) PO LIQD
40.0000 meq | Freq: Once | ORAL | Status: AC
  Administered 2013-08-14: 40 meq
  Filled 2013-08-14: qty 30

## 2013-08-14 MED ORDER — FENTANYL CITRATE 0.05 MG/ML IJ SOLN
25.0000 ug | INTRAMUSCULAR | Status: DC | PRN
  Administered 2013-08-14 – 2013-08-18 (×5): 100 ug via INTRAVENOUS
  Administered 2013-08-18: 50 ug via INTRAVENOUS
  Administered 2013-08-18 – 2013-08-19 (×4): 100 ug via INTRAVENOUS
  Administered 2013-08-19: 50 ug via INTRAVENOUS
  Administered 2013-08-20 – 2013-08-22 (×12): 100 ug via INTRAVENOUS
  Filled 2013-08-14 (×23): qty 2

## 2013-08-14 MED ORDER — HYDROCORTISONE NA SUCCINATE PF 100 MG IJ SOLR
50.0000 mg | Freq: Two times a day (BID) | INTRAMUSCULAR | Status: DC
  Administered 2013-08-14 – 2013-08-15 (×2): 50 mg via INTRAVENOUS
  Filled 2013-08-14 (×4): qty 1

## 2013-08-14 MED ORDER — POTASSIUM CHLORIDE 10 MEQ/100ML IV SOLN
10.0000 meq | INTRAVENOUS | Status: AC
  Administered 2013-08-14 (×6): 10 meq via INTRAVENOUS
  Filled 2013-08-14 (×6): qty 100

## 2013-08-14 MED ORDER — FUROSEMIDE 10 MG/ML IJ SOLN: 40.0000 mg | Freq: Two times a day (BID) | INTRAMUSCULAR | Status: DC

## 2013-08-14 MED ORDER — INSULIN ASPART 100 UNIT/ML ~~LOC~~ SOLN: 2.0000 [IU] | SUBCUTANEOUS | Status: DC

## 2013-08-14 MED ORDER — POTASSIUM CL IN DEXTROSE 5% 20 MEQ/L IV SOLN
20.0000 meq | INTRAVENOUS | Status: DC
  Administered 2013-08-14: 20 meq via INTRAVENOUS
  Filled 2013-08-14 (×2): qty 1000

## 2013-08-14 NOTE — Progress Notes (Signed)
Fascia appears tenuous. Continue tube feeds at current rate Monitor Hgb  Imogene Burn. Georgette Dover, MD, [facility]  General/ Trauma Surgery  08/14/2013 9:15 AM

## 2013-08-14 NOTE — Progress Notes (Signed)
eLink Physician-Brief Progress Note Patient Name: CARMELINE KOWAL DOB: 1940/06/24 MRN: [id number]  Date of Service  08/14/2013   HPI/Events of Note  Hypokalemia 3.5 after aggressive replacement Lasix dur now   eICU Interventions  60 Meq KCL IV Give lasix Repeat BMP around midnight   Intervention Category Minor Interventions: Electrolytes abnormality - evaluation and management  Mauri Brooklyn, P 08/14/2013, 5:37 PM

## 2013-08-14 NOTE — Progress Notes (Signed)
PULMONARY / CRITICAL CARE MEDICINE   Name: [name] MRN: [id number] DOB: 1940/10/06    ADMISSION DATE:  07/29/2013 CONSULTATION DATE:  08/01/2013  REFERRING MD :  Surgery PRIMARY SERVICE: PCCM  CHIEF COMPLAINT:  Bowel obstruction  BRIEF PATIENT DESCRIPTION: 73 y/o female with PMH CAD, HTN, Barrett's esophagus, presented to [facility] ED 7/10 with LLQ pain. Found to have bowel obstruction. To OR for repair 7/13. Remained on vent post-op.    SIGNIFICANT EVENTS / STUDIES: 7/10 Admit, CT Abd - dilated prox colon w/ abrupt transition c/w with obstruction, diverticulitis vs colitis, Surgery c/s 7/12  Empiric Cipro/Flagyl, Cardiology consulted for pre-op evaluation, TPN per pharm 7/13 - Laparotomy, partial colectomy, colostomy, SB resection, repair of enterotomy, lysis of adhesions         - Hgb down to 6.8 >> imp to 10.8 s/p 2u PRBC 7/14 - Extubated, required re-intubation due to hypoxemic resp failure, hypotension, concern for aspiration 7/15 - 2D echo>>> severely dilated LA, normal systolic function   8/31 - Considered ARDS (unclear etiology, ?aspiration), CXR (persistent b/l edema), cont pressors, stable low PCT 1.9 7/17 - CT abd >no bleeding , no abscess, sm fluid in abd/pelvis  7/17 - Ven Dopp>+DVT Rt axillary vein 7/23 -CT Head (subcentimeter focal parenchymal hemorrhage - no SAH or subdural, no acute ischemic change)         - CT chest w/ contrast (severe multilobar pneumonia, moderate b/l pleural effusions, also favor chronicity         - CT Abd/Pelvis (IV, PO) (post-op changes, s/p L-hemicolectomy, transverse colon markedly thickened, unclear if related to acute inflammation/infection vs secondary distention/post-op edema) 7/24 - Trach (DF)        LINES / TUBES: ETT 7/13 >> 7/14, 7/14 >>7/24 Trach (DF) 7/24>>> R IJ CVL 7/14 >>   CULTURES: Blood x 2, 7/15 >> negative Respiratory 7/15 >> few Candida Repeat Blood x2, 7/23 >>> BAL 7/24>>>normal  flora>>>  ANTIBIOTICS: Ceftriaxone 7/10 >>> 7/14 Flagyl 7/10 >>> 7/14 Vancomycin 7/14 >>> 7/16 Fluconazole 7/10 >>> 7/16 Zosyn 7/14 >> 7/21 Vancomycin 7/23 >>> Primaxin 7/23 >>> Micafungin 7/23 >>>7/24  SUBJECTIVE/ OVERNIGHT:  Continues to improve.  Tol ATC x6 hours yesterday.  Weaning pressors, likely off in next few hours.    VITAL SIGNS: Temp:  [98.1 F (36.7 C)-99 F (37.2 C)] 98.5 F (36.9 C) (07/26 0828) Pulse Rate:  [71-142] 94 (07/26 0843) Resp:  [15-37] 25 (07/26 0843) BP: (74-138)/(31-88) 133/51 mmHg (07/26 0745) SpO2:  [91 %-100 %] 97 % (07/26 0843) FiO2 (%):  [40 %-60 %] 40 % (07/26 0843) HEMODYNAMICS: CVP:  [2 mmHg-7 mmHg] 5 mmHg VENTILATOR SETTINGS: Vent Mode:  [-] SIMV;PSV FiO2 (%):  [40 %-60 %] 40 % Set Rate:  [10 bmp] 10 bmp Vt Set:  [380 mL] 380 mL PEEP:  [5 cmH20] 5 cmH20 Pressure Support:  [5 cmH20-15 cmH20] 15 cmH20 Plateau Pressure:  [21 cmH20] 21 cmH20 INTAKE / OUTPUT: Intake/Output     07/25 0701 - 07/26 0700 07/26 0701 - 07/27 0700   I.V. (mL/kg) 820.4 (11.3)    Blood     NG/GT 60    IV Piggyback 1250    Total Intake(mL/kg) 2130.4 (29.3)    Urine (mL/kg/hr) 4110 (2.4)    Stool     Total Output 4110     Net -1979.6            PHYSICAL EXAMINATION: General:  On vent, NAD Neuro:  Awake, alert, nods, MAE, gen weakness  Cardiovascular: Tachycardic, regular rhythm, no murmurs Lungs: resps even non labored on full support, Coarse bilateral, bibasilar crackles. Abdomen:  Soft, abd dressing in place. LLQ colostomy functioning (dark stool), +active BS Ext:  gen edema, 2+ BLE   LABS:  CBC  Recent Labs Lab 08/13/13 0640 08/13/13 1810 08/14/13 0515  WBC 12.9* 20.3* 18.8*  HGB 7.8* 8.9* 8.6*  HCT 24.3* 27.7* 27.0*  PLT 237 298 305   Coag's  Recent Labs Lab 08/11/13 1200  APTT 64*  INR 1.20   BMET  Recent Labs Lab 08/13/13 1810 08/13/13 2337 08/14/13 0227  NA 146 147 148*  K 2.5* 2.7* 3.1*  CL 100 100 103  CO2 37* 35*  35*  BUN 15 15 15   CREATININE 0.32* 0.35* 0.34*  GLUCOSE 108* 136* 86   Electrolytes  Recent Labs Lab 08/12/13 1137 08/12/13 2333  08/13/13 1137 08/13/13 1810 08/13/13 2337 08/14/13 0227  CALCIUM 8.6 8.4  < > 8.0* 8.0* 7.9* 8.1*  MG 2.0 1.6  --  1.4*  --   --   --   PHOS 2.7 2.4  --  2.2*  --   --   --   < > = values in this interval not displayed. Sepsis Markers  Recent Labs Lab 08/09/13 2230 08/10/13 0500 08/11/13 0500 08/12/13 0400  LATICACIDVEN 2.0  --  1.7 1.2  PROCALCITON <0.10 <0.10 <0.10  --    ABG  Recent Labs Lab 08/10/13 0340 08/11/13 0954 08/11/13 1336  PHART 7.497* 7.557* 7.497*  PCO2ART 41.1 32.1* 36.1  PO2ART 152.0* 52.0* 53.0*   Liver Enzymes  Recent Labs Lab 08/08/13 0500  AST 43*  ALT 30  ALKPHOS 72  BILITOT 0.4  ALBUMIN 2.0*   Cardiac Enzymes  Recent Labs Lab 08/08/13 1754  TROPONINI <0.30   Glucose  Recent Labs Lab 08/13/13 1056 08/13/13 1520 08/13/13 1917 08/14/13 08/14/13 0337 08/14/13 0827  GLUCAP 101* 132* 112* 136* 99 156*    CXR - Improved edema   ASSESSMENT / PLAN:  PULMONARY A: Acute respiratory failure, post op - now s/p trach 7/25 ARDS, unclear etiology, ?aspiration - improved.  HCAP, concern for re-infection  Pulm edema - improved  P:   Cont vent support with daily SBT as tol - goal ATC  Cont diuresis as below  F/u CXR in am  abx as above   CARDIOVASCULAR A:  Shock, Septic - Pressor needs improved.  Preserved EF, no evidence cardiogenic shock.  Tachycardia Volume overload  H/o CAD, HTN DVT Rt axillary vein - RUE PICC removed 7/20, anticoagulation off with small ICH P:  Cont wean pressors to off as able  Continue stress dose steroids - wean to q12  Monitor CVP hold BB, while on pressors Hold plavix, lipitor, ASA Lasix 40mg  BID x 2 doses 7/26  RENAL A:   Non-gap Metabolic Acidosis - Resolved Severe hypokalemia - Hypernatremia  Hypophosphatemia  P:   d5W with 20Kcl replete K  aggressively - K runs AND K-phos 7/26 Recheck chem am and pm  Hold lasix until this pm and attempt aggressive pot replacement   GASTROINTESTINAL A:   Suspected colitis with colonic obstruction/SBO s/p partial colectomy with new colostomy 7/13 Ileus, post-op - Resolved, functioning ostomy Hx of Barrett's esophagus Concern oozing anastomosis  P:   Surgery following  Trickle feeds per surgery to start 7/26 Wound/ostomy care  Continue PPI    HEMATOLOGIC A:   Very small ICH Suspected new post-op GIB anastomosis - Hgb stabilized  DVT  Rt axillary vein  RUE PICC removed 7/20 VTE ppx P:  Hold Heparin given concern for GIB and ICH F/u cbc  SCD's Transfuse for Hb < 7 or bleeding - avoid if able with volume overload  F/u coags    INFECTIOUS A:   Colitis, s/p partial colectomy Presumed HCAP vs vap, ?aspiration PNA - No obvious abd source on CT P:   Cont abx as above  F/u repeat cultures/ BAL  Afebrile but WBC trend up - cont broad spectrum abx   ENDOCRINE A: Hyperglycemia  No hx DM P:   Resistant SSI Continue stress dose steroids   NEUROLOGIC A:   Acute encephalopathy - improved  Delirium  Small small ICH on CT P:   RASS goal: 0 Fentanyl gtt - change to PRN Continued Risperdal 0.5mg  qhs Begin pt/ot - d/w surgery    GLOBAL:  Improving overall.  Tol ATC.  Still concern for new HCAP but improving pulm edema.  Cont broad spectrum abx.  Aggressive potassium replacement this am and resume intermittent lasix this PM.  OK for trickle feeds per surgery.  Leave in ICU today (intermittent low dose pressors) but likely tx SDU in am.      Nickolas Madrid, NP 08/14/2013  8:46 AM Pager: [phone] or [phone]  *Care during the described time interval was provided by me and/or other providers on the critical care team. I have reviewed this patient's available data, including medical history, events of note, physical examination and test results as part of my  evaluation.  PCCM ATTENDING: I have interviewed and examined the patient and reviewed the database. I have formulated the assessment and plan as reflected in the note above with amendments made by me. 35 mins of direct critical care time provided. Husband updated @ bedside  Merton Border, MD;  PCCM service; Mobile [phone]

## 2013-08-14 NOTE — Progress Notes (Signed)
Patient ID: LADONA ROSTEN, female   DOB: Oct 11, 1940, 73 y.o.   MRN: [id number] 13 Days Post-Op  Subjective: Pt much more awake and alert today.  Objective: Vital signs in last 24 hours: Temp:  [98.1 F (36.7 C)-99 F (37.2 C)] 98.9 F (37.2 C) (07/26 0400) Pulse Rate:  [71-142] 92 (07/26 0745) Resp:  [15-37] 23 (07/26 0745) BP: (74-138)/(31-88) 133/51 mmHg (07/26 0745) SpO2:  [91 %-100 %] 98 % (07/26 0745) FiO2 (%):  [40 %-60 %] 40 % (07/26 0600) Last BM Date: 08/12/13  Intake/Output from previous day: 07/25 0701 - 07/26 0700 In: 2130.4 [I.V.:820.4; NG/GT:60; IV Piggyback:1250] Out: 4110 [Urine:4110] Intake/Output this shift:    PE: Abd: ostomy bag with still a hint of melena, but more bilious today.  abd is stable, wound stable still with liquified fascia.  Lab Results:   Recent Labs  08/13/13 1810 08/14/13 0515  WBC 20.3* 18.8*  HGB 8.9* 8.6*  HCT 27.7* 27.0*  PLT 298 305   BMET  Recent Labs  08/13/13 2337 08/14/13 0227  NA 147 148*  K 2.7* 3.1*  CL 100 103  CO2 35* 35*  GLUCOSE 136* 86  BUN 15 15  CREATININE 0.35* 0.34*  CALCIUM 7.9* 8.1*   PT/INR  Recent Labs  08/11/13 1200  LABPROT 15.2  INR 1.20   CMP     Component Value Date/Time   NA 148* 08/14/2013 0227   K 3.1* 08/14/2013 0227   CL 103 08/14/2013 0227   CO2 35* 08/14/2013 0227   GLUCOSE 86 08/14/2013 0227   BUN 15 08/14/2013 0227   CREATININE 0.34* 08/14/2013 0227   CALCIUM 8.1* 08/14/2013 0227   PROT 5.0* 08/08/2013 0500   ALBUMIN 2.0* 08/08/2013 0500   AST 43* 08/08/2013 0500   ALT 30 08/08/2013 0500   ALKPHOS 72 08/08/2013 0500   BILITOT 0.4 08/08/2013 0500   GFRNONAA >90 08/14/2013 0227   GFRAA >90 08/14/2013 0227   Lipase     Component Value Date/Time   LIPASE 9* 07/29/2013 1605       Studies/Results: Dg Chest Portable 1 View  08/14/2013   CLINICAL DATA:  Difficulty breathing  EXAM: PORTABLE CHEST - 1 VIEW  COMPARISON:  August 12, 2013  FINDINGS: Tracheostomy catheter tip is  6.1 cm above the carina. Central catheter tip is in the superior vena cava. Nasogastric tube tip and side port are below the diaphragm. No pneumothorax. There is moderate interstitial edema with some interval clearing compared to recent prior study. There is atelectatic change in the lung bases. There are small pleural effusions bilaterally. There is underlying emphysema. Heart is mildly enlarged with pulmonary vascularity within normal limits. No adenopathy appreciable.  IMPRESSION: There has been partial but incomplete clearing of interstitial edema. Moderate interstitial edema does remain, however. There is cardiomegaly with small pleural effusions. The appearance is consistent with congestive heart failure superimposed on emphysematous change. There is bibasilar atelectatic change. Tube and catheter positions are as described without pneumothorax.   Electronically Signed   By: Lowella Grip M.D.   On: 08/14/2013 07:06   Chest Portable 1 View To Assess Tube Placement And Rule-out Pneumothorax  08/12/2013   CLINICAL DATA:  73 year old female with tracheostomy placement. Initial encounter.  EXAM: PORTABLE CHEST - 1 VIEW  COMPARISON:  0519 hr the same day and earlier.  FINDINGS: Portable AP semi upright view at 1415 hrs. ET tube is been replaced with a tracheostomy tube which projects over the tracheal air column, no  adverse features identified. Stable right IJ central line. Stable cardiac size and mediastinal contours. No pneumothorax.  Enteric tube remains in place. Pulmonary ventilation not significantly changed.  IMPRESSION: 1. ETT replaced with tracheostomy tube. No pneumothorax or adverse features. 2. Otherwise stable chest.   Electronically Signed   By: Lars Pinks M.D.   On: 08/12/2013 14:23    Anti-infectives: Anti-infectives   Start     Dose/Rate Route Frequency Ordered Stop   08/13/13 1600  vancomycin (VANCOCIN) IVPB 1000 mg/200 mL premix     1,000 mg 200 mL/hr over 60 Minutes Intravenous  Every 12 hours 08/13/13 1435     08/11/13 1400  vancomycin (VANCOCIN) IVPB 750 mg/150 ml premix  Status:  Discontinued     750 mg 150 mL/hr over 60 Minutes Intravenous Every 12 hours 08/11/13 1138 08/13/13 1435   08/11/13 1300  imipenem-cilastatin (PRIMAXIN) 500 mg in sodium chloride 0.9 % 100 mL IVPB     500 mg 200 mL/hr over 30 Minutes Intravenous 3 times per day 08/11/13 1138     08/11/13 1230  micafungin (MYCAMINE) 100 mg in sodium chloride 0.9 % 100 mL IVPB  Status:  Discontinued     100 mg 100 mL/hr over 1 Hours Intravenous Daily 08/11/13 1138 08/12/13 1132   08/07/13 1400  piperacillin-tazobactam (ZOSYN) IVPB 3.375 g     3.375 g 12.5 mL/hr over 240 Minutes Intravenous 3 times per day 08/07/13 0921 08/10/13 0101   08/03/13 1800  vancomycin (VANCOCIN) IVPB 750 mg/150 ml premix  Status:  Discontinued     750 mg 150 mL/hr over 60 Minutes Intravenous Every 12 hours 08/03/13 0440 08/04/13 0910   08/03/13 0500  vancomycin (VANCOCIN) 1,250 mg in sodium chloride 0.9 % 250 mL IVPB     1,250 mg 166.7 mL/hr over 90 Minutes Intravenous  Once 08/03/13 0440 08/03/13 0646   08/03/13 0500  piperacillin-tazobactam (ZOSYN) IVPB 3.375 g  Status:  Discontinued     3.375 g 12.5 mL/hr over 240 Minutes Intravenous 3 times per day 08/03/13 0440 08/07/13 0921   08/01/13 1245  metroNIDAZOLE (FLAGYL) IVPB 500 mg     500 mg 100 mL/hr over 60 Minutes Intravenous To Surgery 08/01/13 1238 08/01/13 1300   07/31/13 0800  cefoTEtan (CEFOTAN) 2 g in dextrose 5 % 50 mL IVPB     2 g 100 mL/hr over 30 Minutes Intravenous On call to O.R. 07/31/13 0741 08/01/13 0559   07/31/13 0800  cefTRIAXone (ROCEPHIN) 2 g in dextrose 5 % 50 mL IVPB  Status:  Discontinued    Comments:  Pharmacy may adjust dosing strength / duration / interval for maximal efficacy   2 g 100 mL/hr over 30 Minutes Intravenous Every 24 hours 07/31/13 0744 08/03/13 0405   07/29/13 2100  ciprofloxacin (CIPRO) IVPB 400 mg  Status:  Discontinued     400  mg 200 mL/hr over 60 Minutes Intravenous Every 12 hours 07/29/13 2058 07/31/13 0744   07/29/13 2100  metroNIDAZOLE (FLAGYL) IVPB 500 mg  Status:  Discontinued     500 mg 100 mL/hr over 60 Minutes Intravenous Every 8 hours 07/29/13 2058 08/03/13 0405   07/29/13 2100  fluconazole (DIFLUCAN) IVPB 200 mg  Status:  Discontinued     200 mg 100 mL/hr over 60 Minutes Intravenous Every 24 hours 07/29/13 2058 08/04/13 0911   07/29/13 1930  ciprofloxacin (CIPRO) IVPB 400 mg     400 mg 200 mL/hr over 60 Minutes Intravenous  Once 07/29/13 1915 07/29/13 2031  07/29/13 1930  metroNIDAZOLE (FLAGYL) IVPB 500 mg  Status:  Discontinued     500 mg 100 mL/hr over 60 Minutes Intravenous  Once 07/29/13 1915 07/29/13 2107       Assessment/Plan  1. POD 13, EXPLORATORY LAPAROTOMY (N/A)  PARTIAL COLECTOMY (N/A)  COLOSTOMY (N/A)  SMALL BOWEL RESECTION (N/A)  2. Postoperative ileus, resolved  3. Ventilator dependent respiratory failure  4. ARDS  5. Leukocytosis  6. Multilobar PNA  7. Small parenchymal hemorrhage  8. ABL anemia, secondary to GI bleed, stable  Plan: 1. hgb stable.  Less melenotic appearing output, but still a slight hint.  Will resume TFs at 20cc/hr.  Would not advance today.  See if hgb and output remains stable today.  If so, can advance tomorrow. 2. Cont dressing changes.    LOS: 16 days    Donyetta Ogletree E 08/14/2013, 8:16 AM Pager: [phone]

## 2013-08-14 NOTE — Progress Notes (Signed)
Pt removed from vent and placed on 40% ATC.  Pt tolerating well at this time.  RT will continue to monitor.

## 2013-08-14 NOTE — Progress Notes (Signed)
Changes made to vent by Dr. Leonidas Romberg. PRVC 500, R14, Peep 5, FIO2 .40

## 2013-08-14 NOTE — Progress Notes (Signed)
CRITICAL VALUE ALERT  Critical value received:  K+  2.7  Date of notification:  08/14/2013  Time of notification:  0100  Critical value read back:Yes.    Nurse who received alert:  G. Lindalou Hose, RN  MD notified (1st page):  Dr. Oliver Pila  Time of first page:  0105  MD notified (2nd page):  Time of second page:  Responding MD:  Dr. Oliver Pila  Time MD responded:  [phone]

## 2013-08-14 NOTE — Progress Notes (Signed)
eLink Physician-Brief Progress Note Patient Name: DEYCI GESELL DOB: 1940/12/02 MRN: [id number]  Date of Service  08/14/2013   HPI/Events of Note   K 2.7 @ 2330 K replaced last 40 mEq PO @ 2300   eICU Interventions   Give 40 mEq PO @ 2:00 Repeat BMP @ 5: 00    Intervention Category Intermediate Interventions: Electrolyte abnormality - evaluation and management  Vincient Vanaman 08/14/2013, 1:17 AM

## 2013-08-15 LAB — GLUCOSE, CAPILLARY
GLUCOSE-CAPILLARY: 135 mg/dL — AB (ref 70–99)
GLUCOSE-CAPILLARY: 157 mg/dL — AB (ref 70–99)
GLUCOSE-CAPILLARY: 172 mg/dL — AB (ref 70–99)
Glucose-Capillary: 121 mg/dL — ABNORMAL HIGH (ref 70–99)
Glucose-Capillary: 142 mg/dL — ABNORMAL HIGH (ref 70–99)

## 2013-08-15 LAB — BASIC METABOLIC PANEL
Anion gap: 8 (ref 5–15)
BUN: 18 mg/dL (ref 6–23)
CO2: 32 mEq/L (ref 19–32)
Calcium: 7.6 mg/dL — ABNORMAL LOW (ref 8.4–10.5)
Chloride: 104 mEq/L (ref 96–112)
Creatinine, Ser: 0.36 mg/dL — ABNORMAL LOW (ref 0.50–1.10)
Glucose, Bld: 147 mg/dL — ABNORMAL HIGH (ref 70–99)
POTASSIUM: 3.9 meq/L (ref 3.7–5.3)
SODIUM: 144 meq/L (ref 137–147)

## 2013-08-15 LAB — COMPREHENSIVE METABOLIC PANEL
ALT: 23 U/L (ref 0–35)
AST: 19 U/L (ref 0–37)
Albumin: 1.8 g/dL — ABNORMAL LOW (ref 3.5–5.2)
Alkaline Phosphatase: 60 U/L (ref 39–117)
Anion gap: 8 (ref 5–15)
BUN: 19 mg/dL (ref 6–23)
CALCIUM: 7.7 mg/dL — AB (ref 8.4–10.5)
CO2: 32 meq/L (ref 19–32)
CREATININE: 0.38 mg/dL — AB (ref 0.50–1.10)
Chloride: 103 mEq/L (ref 96–112)
GLUCOSE: 142 mg/dL — AB (ref 70–99)
Potassium: 3.5 mEq/L — ABNORMAL LOW (ref 3.7–5.3)
Sodium: 143 mEq/L (ref 137–147)
TOTAL PROTEIN: 4.2 g/dL — AB (ref 6.0–8.3)
Total Bilirubin: 1.3 mg/dL — ABNORMAL HIGH (ref 0.3–1.2)

## 2013-08-15 LAB — CBC
HCT: 25 % — ABNORMAL LOW (ref 36.0–46.0)
Hemoglobin: 7.9 g/dL — ABNORMAL LOW (ref 12.0–15.0)
MCH: 28.8 pg (ref 26.0–34.0)
MCHC: 31.6 g/dL (ref 30.0–36.0)
MCV: 91.2 fL (ref 78.0–100.0)
PLATELETS: 268 10*3/uL (ref 150–400)
RBC: 2.74 MIL/uL — ABNORMAL LOW (ref 3.87–5.11)
RDW: 20.2 % — ABNORMAL HIGH (ref 11.5–15.5)
WBC: 14.1 10*3/uL — AB (ref 4.0–10.5)

## 2013-08-15 MED ORDER — FUROSEMIDE 10 MG/ML IJ SOLN
40.0000 mg | Freq: Three times a day (TID) | INTRAMUSCULAR | Status: AC
  Administered 2013-08-15 (×2): 40 mg via INTRAVENOUS
  Filled 2013-08-15 (×2): qty 4

## 2013-08-15 MED ORDER — FUROSEMIDE 10 MG/ML IJ SOLN: 40.0000 mg | Freq: Four times a day (QID) | INTRAMUSCULAR | Status: DC

## 2013-08-15 MED ORDER — POTASSIUM CHLORIDE 20 MEQ/15ML (10%) PO LIQD
40.0000 meq | Freq: Three times a day (TID) | ORAL | Status: AC
  Administered 2013-08-15 (×3): 40 meq
  Filled 2013-08-15 (×3): qty 30

## 2013-08-15 MED ORDER — HYDROCORTISONE NA SUCCINATE PF 100 MG IJ SOLR
50.0000 mg | Freq: Four times a day (QID) | INTRAMUSCULAR | Status: DC
  Administered 2013-08-15 – 2013-08-16 (×4): 50 mg via INTRAVENOUS
  Filled 2013-08-15 (×8): qty 1

## 2013-08-15 MED ORDER — VITAL HIGH PROTEIN PO LIQD
1000.0000 mL | ORAL | Status: DC
  Administered 2013-08-15 – 2013-08-21 (×8): 1000 mL
  Filled 2013-08-15 (×12): qty 1000

## 2013-08-15 NOTE — Op Note (Signed)
[name], POLLMAN NO.:  [id number]  MEDICAL RECORD NO.:  [id number]  LOCATION:  2M05C                        FACILITY:  [facility]  PHYSICIAN:  Raylene Miyamoto, MD DATE OF BIRTH:  [birth year]  DATE OF PROCEDURE:  08/12/2013 DATE OF DISCHARGE:                              OPERATIVE REPORT   PROCEDURE:  Percutaneous tracheostomy.  Consent was obtained from the patient's husband, fully aware of risks and benefits of the procedure including infection, bleeding, pneumothorax, and death.  PREOPERATIVE DIAGNOSIS:  Acute lung injury, status post abdominal surgery, failure to wean.  POSTOPERATIVE DIAGNOSIS:  Status post tracheostomy secondary to above diagnosis.  DESCRIPTION OF PROCEDURE:  The patient was placed in supine position. Chlorhexidine preparation was used to sterilize the operative site.  The patient was on no continuous anticoagulants.  Bronchoscopist placed a bronchoscope through the endotracheal tube, backed up to approximately 17 cm.  Chlorhexidine preparation was used to sterilize the operative site.  A 6 mL of lidocaine plus epinephrine was injected over the operative site.  The patient required sedation, Versed, fentanyl, etomidate.  A 1-cm vertical incision was made.  Dissection was made down to the tracheal planes.  An 18-gauge needle over white catheter sheath was placed in the airway successfully without any posterior wall injury. The needle was removed.  White catheter sheath remained.  The wire was placed in the white catheter sheath.  I then placed a 14-French punch dilator over the wire in and out, then the patient progressed with Rhino dilator over a glider in and out of the airway to approximately 30- [place] and placed a 6 Shiley tracheostomy over 26-French dilator in and out the airway with ease.  The tracheostomy remained, the glider and wire and dilator were removed.  The bronchoscopist placed the bronchoscope through the new  tracheostomy and noted carina approximately 4.5 cm below without any posterior wall injury or active bleeding. Blood loss for the procedure was less than 1 mL.  Sutures were placed at all four corners of the size 6 tracheostomy.  Postoperative chest x-ray revealed a well-placed tracheostomy tube without any apparent complications.  This patient can follow up in our Percutaneous Tracheostomy Clinic by calling [phone].     Raylene Miyamoto, MD     DJF/MEDQ  D:  08/14/2013  T:  08/14/2013  Job:  [id number]

## 2013-08-15 NOTE — Progress Notes (Signed)
PULMONARY / CRITICAL CARE MEDICINE   Name: [name] MRN: [id number] DOB: Jul 07, 1940    ADMISSION DATE:  07/29/2013 CONSULTATION DATE:  08/01/2013  REFERRING MD :  Surgery PRIMARY SERVICE: PCCM  CHIEF COMPLAINT:  Bowel obstruction  BRIEF PATIENT DESCRIPTION: 73 y/o female with PMH CAD, HTN, Barrett's esophagus, presented to [facility] ED 7/10 with LLQ pain. Found to have bowel obstruction. To OR for repair 7/13. Remained on vent post-op.   SIGNIFICANT EVENTS / STUDIES: 7/10 Admit, CT Abd - dilated prox colon w/ abrupt transition c/w with obstruction, diverticulitis vs colitis, Surgery c/s 7/12  Empiric Cipro/Flagyl, Cardiology consulted for pre-op evaluation, TPN per pharm 7/13 - Laparotomy, partial colectomy, colostomy, SB resection, repair of enterotomy, lysis of adhesions         - Hgb down to 6.8 >> imp to 10.8 s/p 2u PRBC 7/14 - Extubated, required re-intubation due to hypoxemic resp failure, hypotension, concern for aspiration 7/15 - 2D echo>>> severely dilated LA, normal systolic function   5/78 - Considered ARDS (unclear etiology, ?aspiration), CXR (persistent b/l edema), cont pressors, stable low PCT 1.9 7/17 - CT abd >no bleeding , no abscess, sm fluid in abd/pelvis  7/17 - Ven Dopp>+DVT Rt axillary vein 7/23 -CT Head (subcentimeter focal parenchymal hemorrhage - no SAH or subdural, no acute ischemic change)         - CT chest w/ contrast (severe multilobar pneumonia, moderate b/l pleural effusions, also favor chronicity         - CT Abd/Pelvis (IV, PO) (post-op changes, s/p L-hemicolectomy, transverse colon markedly thickened, unclear if related to acute inflammation/infection vs secondary distention/post-op edema) 7/24 - s/p bedside trach (DF) / bronchoscopy 7/25 - melena with dec Hgb 7/26 - off vent placed on 40% trach collar >> return to vent, CXR (partial improvement b/l edema) 7/27 - improved mental status and off cont sedation, req brief Levo o/n with MAP 40s,  discontinued @ 0600, Hgb 7.9      LINES / TUBES: ETT 7/13 >> 7/14, 7/14 >>7/24 Trach (DF) 7/24>>> R IJ CVL 7/14 >>   CULTURES: Blood x 2, 7/15 >> negative Respiratory 7/15 >> few Candida Repeat Blood x2, 7/23 >>> BAL 7/24>>>normal flora>>>  ANTIBIOTICS: Ceftriaxone 7/10 >>> 7/14 Flagyl 7/10 >>> 7/14 Vancomycin 7/14 >>> 7/16 Fluconazole 7/10 >>> 7/16 Zosyn 7/14 >> 7/21 Vancomycin 7/23 >>> Primaxin 7/23 >>> Micafungin 7/23 >>>7/24  SUBJECTIVE/ OVERNIGHT: Per RN, required low dose Levo o/n (previously off) due to MAP 40s, discontinued this AM @ 0600. Currently with MAP 58. Off continuous sedation. Overall improved mental status, following commands.  VITAL SIGNS: Temp:  [98.5 F (36.9 C)-99.3 F (37.4 C)] 98.5 F (36.9 C) (07/27 0436) Pulse Rate:  [83-139] 87 (07/27 0600) Resp:  [20-36] 22 (07/27 0600) BP: (78-133)/(36-75) 115/44 mmHg (07/27 0600) SpO2:  [90 %-100 %] 99 % (07/27 0600) FiO2 (%):  [40 %] 40 % (07/27 0600) HEMODYNAMICS: CVP:  [5 mmHg-6 mmHg] 5 mmHg VENTILATOR SETTINGS: Vent Mode:  [-] PRVC FiO2 (%):  [40 %] 40 % Set Rate:  [14 bmp] 14 bmp Vt Set:  [500 mL] 500 mL PEEP:  [5 cmH20] 5 cmH20 Plateau Pressure:  [18 cmH20-22 cmH20] 18 cmH20 INTAKE / OUTPUT: Intake/Output     07/26 0701 - 07/27 0700 07/27 0701 - 07/28 0700   I.V. (mL/kg) 736.4 (10.1)    NG/GT 360    IV Piggyback 2210    Total Intake(mL/kg) 3306.4 (45.5)    Urine (mL/kg/hr) 2700 (1.5)  Total Output 2700     Net +606.4           PHYSICAL EXAMINATION: General:  On trach collar, NAD Neuro:  Awake, alert, nods, follows commands Cardiovascular: Tachycardic,regular rhythm, no murmurs Lungs: Improved bilateral coarse breath sounds, persistent bibasilar crackles. Abdomen:  Soft, abd dressing in place. LLQ colostomy functioning (brown liquid stool), +active BS Ext: improved bilateral ext pitting edema, UE non-pitting, b/l LE with +1 pitting, intact peripheral pulses  LABS:  CBC  Recent  Labs Lab 08/14/13 0515 08/14/13 1642 08/15/13 0432  WBC 18.8* 15.9* 14.1*  HGB 8.6* 8.1* 7.9*  HCT 27.0* 25.0* 25.0*  PLT 305 272 268   Coag's  Recent Labs Lab 08/11/13 1200  APTT 64*  INR 1.20   BMET  Recent Labs Lab 08/14/13 1642 08/15/13 0130 08/15/13 0432  NA 147 144 143  K 3.5* 3.9 3.5*  CL 105 104 103  CO2 33* 32 32  BUN 18 18 19   CREATININE 0.35* 0.36* 0.38*  GLUCOSE 151* 147* 142*   Electrolytes  Recent Labs Lab 08/12/13 1137 08/12/13 2333  08/13/13 1137  08/14/13 1642 08/15/13 0130 08/15/13 0432  CALCIUM 8.6 8.4  < > 8.0*  < > 7.8* 7.6* 7.7*  MG 2.0 1.6  --  1.4*  --   --   --   --   PHOS 2.7 2.4  --  2.2*  --   --   --   --   < > = values in this interval not displayed. Sepsis Markers  Recent Labs Lab 08/09/13 2230 08/10/13 0500 08/11/13 0500 08/12/13 0400  LATICACIDVEN 2.0  --  1.7 1.2  PROCALCITON <0.10 <0.10 <0.10  --    ABG  Recent Labs Lab 08/10/13 0340 08/11/13 0954 08/11/13 1336  PHART 7.497* 7.557* 7.497*  PCO2ART 41.1 32.1* 36.1  PO2ART 152.0* 52.0* 53.0*   Liver Enzymes  Recent Labs Lab 08/15/13 0432  AST 19  ALT 23  ALKPHOS 60  BILITOT 1.3*  ALBUMIN 1.8*   Cardiac Enzymes  Recent Labs Lab 08/08/13 1754  TROPONINI <0.30   Glucose  Recent Labs Lab 08/14/13 0827 08/14/13 1157 08/14/13 1734 08/14/13 1950 08/14/13 2352 08/15/13 0346  GLUCAP 156* 118* 172* 111* 157* 121*    CXR - Improved edema   ASSESSMENT / PLAN:  PULMONARY A: Acute respiratory failure, post op - now s/p trach 7/25 - off vent ARDS, unclear etiology, ?aspiration - improved HCAP, concern for re-infection  Pulm edema - improved  P:   - Tolerating ATC, off vent, will maintain as tolerated. - Cont diuresis as below  - Repeat CXR in AM  CARDIOVASCULAR A:  Shock, Septic - Off pressors (req low dose Levo o/n) Tachycardia Volume overload  H/o CAD, HTN DVT Rt axillary vein - RUE PICC removed 7/20, anticoagulation off with  small ICH P:  - Remains on pressors. - Monitor BP, MAP goal >65 - Continue stress dose steroids - continue 60 mg IV q6 hrs.  - Monitor CVP - Lasix as ordered. - Hold BB, while on pressors and low MAP, change goal map to 60. - Hold plavix, lipitor, ASA  RENAL A:   Non-gap Metabolic Acidosis - Resolved Severe hypokalemia - Hypernatremia  Hypophosphatemia  P:   - KVO IVF. - Replete electrolytes as indicated. - Monitor BMET daily, BID with diuresis. - Lasix 40 mg IV q6 x3 doses.  GASTROINTESTINAL A:   Suspected colitis with colonic obstruction/SBO s/p partial colectomy with new colostomy 7/13 Ileus,  post-op - Resolved, functioning ostomy Hx of Barrett's esophagus Concern oozing anastomosis  P:   - Surgery following  - Continue tube feeds @ 20 cc/hr, escalate per surgery - Wound/ostomy care  - Continue PPI   HEMATOLOGIC A:   Very small ICH Suspected new post-op GIB anastomosis - Hgb stabilized  DVT Rt axillary vein  RUE PICC removed 7/20 VTE ppx P:  - Hold Heparin given concern for GIB and ICH - Monitor CBC - SCD's - Transfuse for Hb < 7 or bleeding - avoid if able with volume overload   INFECTIOUS A:   Colitis, s/p partial colectomy Presumed HCAP vs vap, ?aspiration PNA - No obvious abd source on CT P:   - Cont abx as above  - F/u repeat cultures/ BAL >>> NGTD - Afebrile but WBC trend up - cont broad spectrum abx   ENDOCRINE A: Hyperglycemia  No hx DM P:   - Resistant SSI - Continue to wean stress dose steroids, increase to 50 mg IV q6 hours.  NEUROLOGIC A:   Acute encephalopathy - improved, off continuous sedation  Delirium  Small small ICH on CT P:   - RASS goal: 0 - Fentanyl PRN, off gtt - D/C Risperdal 0.5mg  QHS - Begin pt/ot - d/w surgery   Nobie Putnam, [place], PGY-2  GLOBAL:   Continued overall improvement, tolerating ATC, resolving pulm edema, anticipate continued diuresis as tolerated by BP/MAP and  monitor K. Continue antibiotics in setting of HCAP. Surgery following, cont tube feeds.  Decrease MAP goal to 60, increase stress dose steroids.  CC time 35 min.  Patient seen and examined, agree with above note.  I dictated the care and orders written for this patient under my direction.  Rush Farmer, MD [phone]  08/15/2013  7:15 AM

## 2013-08-15 NOTE — Progress Notes (Signed)
[facility] Progress Note 14 Days Post-Op  Subjective: Patient is alert on the ventilator.  Just changed to trach collar.  Objective: Vital signs in last 24 hours: Temp:  [98.5 F (36.9 C)-99.3 F (37.4 C)] 98.5 F (36.9 C) (07/27 0436) Pulse Rate:  [83-139] 86 (07/27 0740) Resp:  [20-36] 21 (07/27 0740) BP: (78-125)/(36-75) 115/44 mmHg (07/27 0600) SpO2:  [90 %-100 %] 99 % (07/27 0740) FiO2 (%):  [40 %] 40 % (07/27 0740) Last BM Date: 08/12/13  Intake/Output from previous day: 07/26 0701 - 07/27 0700 In: 3306.4 [I.V.:736.4; NG/GT:360; IV Piggyback:2210] Out: 2700 [Urine:2700] Intake/Output this shift:    General: No distress  Lungs: Clear  Abd: Midline wound leaking peritoneal fluid from upper portion.  Fascia is loose and sutures stretched.  Not a lot of granulation tissue.  I do not think this is a good wound for a VAC yet.  Extremities: No changes  Neuro: Intact  Lab Results:  @LABLAST2 (wbc:2,hgb:2,hct:2,plt:2) BMET  Recent Labs  08/15/13 0130 08/15/13 0432  NA 144 143  K 3.9 3.5*  CL 104 103  CO2 32 32  GLUCOSE 147* 142*  BUN 18 19  CREATININE 0.36* 0.38*  CALCIUM 7.6* 7.7*   PT/INR No results found for this basename: LABPROT, INR,  in the last 72 hours ABG No results found for this basename: PHART, PCO2, PO2, HCO3,  in the last 72 hours  Studies/Results: Dg Chest Portable 1 View  08/14/2013   CLINICAL DATA:  Difficulty breathing  EXAM: PORTABLE CHEST - 1 VIEW  COMPARISON:  August 12, 2013  FINDINGS: Tracheostomy catheter tip is 6.1 cm above the carina. Central catheter tip is in the superior vena cava. Nasogastric tube tip and side port are below the diaphragm. No pneumothorax. There is moderate interstitial edema with some interval clearing compared to recent prior study. There is atelectatic change in the lung bases. There are small pleural effusions bilaterally. There is underlying emphysema. Heart is mildly enlarged with pulmonary vascularity within  normal limits. No adenopathy appreciable.  IMPRESSION: There has been partial but incomplete clearing of interstitial edema. Moderate interstitial edema does remain, however. There is cardiomegaly with small pleural effusions. The appearance is consistent with congestive heart failure superimposed on emphysematous change. There is bibasilar atelectatic change. Tube and catheter positions are as described without pneumothorax.   Electronically Signed   By: Lowella Grip M.D.   On: 08/14/2013 07:06    Anti-infectives: Anti-infectives   Start     Dose/Rate Route Frequency Ordered Stop   08/13/13 1600  vancomycin (VANCOCIN) IVPB 1000 mg/200 mL premix     1,000 mg 200 mL/hr over 60 Minutes Intravenous Every 12 hours 08/13/13 1435     08/11/13 1400  vancomycin (VANCOCIN) IVPB 750 mg/150 ml premix  Status:  Discontinued     750 mg 150 mL/hr over 60 Minutes Intravenous Every 12 hours 08/11/13 1138 08/13/13 1435   08/11/13 1300  imipenem-cilastatin (PRIMAXIN) 500 mg in sodium chloride 0.9 % 100 mL IVPB     500 mg 200 mL/hr over 30 Minutes Intravenous 3 times per day 08/11/13 1138     08/11/13 1230  micafungin (MYCAMINE) 100 mg in sodium chloride 0.9 % 100 mL IVPB  Status:  Discontinued     100 mg 100 mL/hr over 1 Hours Intravenous Daily 08/11/13 1138 08/12/13 1132   08/07/13 1400  piperacillin-tazobactam (ZOSYN) IVPB 3.375 g     3.375 g 12.5 mL/hr over 240 Minutes Intravenous 3 times per day 08/07/13 4010  08/10/13 0101   08/03/13 1800  vancomycin (VANCOCIN) IVPB 750 mg/150 ml premix  Status:  Discontinued     750 mg 150 mL/hr over 60 Minutes Intravenous Every 12 hours 08/03/13 0440 08/04/13 0910   08/03/13 0500  vancomycin (VANCOCIN) 1,250 mg in sodium chloride 0.9 % 250 mL IVPB     1,250 mg 166.7 mL/hr over 90 Minutes Intravenous  Once 08/03/13 0440 08/03/13 0646   08/03/13 0500  piperacillin-tazobactam (ZOSYN) IVPB 3.375 g  Status:  Discontinued     3.375 g 12.5 mL/hr over 240 Minutes  Intravenous 3 times per day 08/03/13 0440 08/07/13 0921   08/01/13 1245  metroNIDAZOLE (FLAGYL) IVPB 500 mg     500 mg 100 mL/hr over 60 Minutes Intravenous To Surgery 08/01/13 1238 08/01/13 1300   07/31/13 0800  cefoTEtan (CEFOTAN) 2 g in dextrose 5 % 50 mL IVPB     2 g 100 mL/hr over 30 Minutes Intravenous On call to O.R. 07/31/13 0741 08/01/13 0559   07/31/13 0800  cefTRIAXone (ROCEPHIN) 2 g in dextrose 5 % 50 mL IVPB  Status:  Discontinued    Comments:  Pharmacy may adjust dosing strength / duration / interval for maximal efficacy   2 g 100 mL/hr over 30 Minutes Intravenous Every 24 hours 07/31/13 0744 08/03/13 0405   07/29/13 2100  ciprofloxacin (CIPRO) IVPB 400 mg  Status:  Discontinued     400 mg 200 mL/hr over 60 Minutes Intravenous Every 12 hours 07/29/13 2058 07/31/13 0744   07/29/13 2100  metroNIDAZOLE (FLAGYL) IVPB 500 mg  Status:  Discontinued     500 mg 100 mL/hr over 60 Minutes Intravenous Every 8 hours 07/29/13 2058 08/03/13 0405   07/29/13 2100  fluconazole (DIFLUCAN) IVPB 200 mg  Status:  Discontinued     200 mg 100 mL/hr over 60 Minutes Intravenous Every 24 hours 07/29/13 2058 08/04/13 0911   07/29/13 1930  ciprofloxacin (CIPRO) IVPB 400 mg     400 mg 200 mL/hr over 60 Minutes Intravenous  Once 07/29/13 1915 07/29/13 2031   07/29/13 1930  metroNIDAZOLE (FLAGYL) IVPB 500 mg  Status:  Discontinued     500 mg 100 mL/hr over 60 Minutes Intravenous  Once 07/29/13 1915 07/29/13 2107      Assessment/Plan: s/p Procedure(s): EXPLORATORY LAPAROTOMY PARTIAL COLECTOMY COLOSTOMY SMALL BOWEL RESECTION Advance diet Tube feedings only. Has [facility] sump but could be changed to soft feeding tube if patient cannot swallow.  LOS: 17 days   Kathryne Eriksson. Dahlia Bailiff, MD, FACS [phone] [facility] Surgery 08/15/2013

## 2013-08-15 NOTE — Evaluation (Signed)
Physical Therapy Evaluation Patient Details Name: [name] MRN: [id number] DOB: 02-16-1940 Today's Date: 08/15/2013   History of Present Illness  Pt admit with colonic obstruction.  SB resection with colostomy.  VDRF and trach.    Clinical Impression  Pt admitted with above. Pt currently with functional limitations due to the deficits listed below (see PT Problem List).  Pt will benefit from skilled PT to increase their independence and safety with mobility to allow discharge to the venue listed below.     Follow Up Recommendations CIR;Supervision/Assistance - 24 hour    Equipment Recommendations  Other (comment) (TBA)    Recommendations for Other Services Rehab consult     Precautions / Restrictions Precautions Precautions: Fall Restrictions Weight Bearing Restrictions: No      Mobility  Bed Mobility Overal bed mobility: Needs Assistance;+2 for physical assistance Bed Mobility: Supine to Sit     Supine to sit: +2 for physical assistance;Max assist     General bed mobility comments: Pt needed assist for LEs and for elevation of trunk.    Transfers Overall transfer level: Needs assistance Equipment used: None Transfers: Squat Pivot Transfers     Squat pivot transfers: +2 physical assistance;Max assist;From elevated surface     General transfer comment: Used pad to assist with squat pivot transfer.  Pt able to weight bear but only minimally on feet.  Very poor postural stability.    Ambulation/Gait                Stairs            Wheelchair Mobility    Modified Rankin (Stroke Patients Only)       Balance Overall balance assessment: Needs assistance;History of Falls Sitting-balance support: Bilateral upper extremity supported;Feet supported Sitting balance-Leahy Scale: Zero Sitting balance - Comments: Pt unable to maintain sitting balance more than a few seconds with min guard.  needs mod to max assist most of time sitting EOB.  Sat  a total of 8 minutes at EOB.   Postural control: Posterior lean     Standing balance comment: unable to achieve full stand.  Clears bottom off bed with +2 total assist.                               Pertinent Vitals/Pain Pt HR 98-119 bpm, BP 130/51pre activity to 134/69 post activity.  O2 94% at rest on 40% trach collar.  Had to incr pt to 60% trach collar for 5 min after transfer to chair as pt sats decr to 85% for a brief time. Then decr FiO2 to 40% again with sats 91-94% at rest.  No pain.      Home Living Family/patient expects to be discharged to:: Private residence Living Arrangements: Spouse/significant other Available Help at Discharge: Family;Available 24 hours/day Type of Home: House Home Access: Stairs to enter Entrance Stairs-Rails: None Entrance Stairs-Number of Steps: 2 Home Layout: One level Home Equipment: Wheelchair - manual;Walker - standard;Shower seat (Equipment was pts ex husbands)      Prior Function Level of Independence: Needs assistance   Gait / Transfers Assistance Needed: Pt had to have help  with sit to stand.  Husband stated she ambulated independently PTA.    ADL's / Homemaking Assistance Needed: supervision with B/D        Hand Dominance        Extremity/Trunk Assessment   Upper Extremity Assessment: Defer to OT evaluation  Lower Extremity Assessment: RLE deficits/detail;LLE deficits/detail RLE Deficits / Details: appears 2+/5 LLE Deficits / Details: appears 2+/5  Cervical / Trunk Assessment: Normal  Communication   Communication: Tracheostomy  Cognition Arousal/Alertness: Awake/alert Behavior During Therapy: Anxious Overall Cognitive Status: Within Functional Limits for tasks assessed                      General Comments General comments (skin integrity, edema, etc.): All 4 extremities swollen.      Exercises General Exercises - Upper Extremity Shoulder Flexion: AAROM;Both;5 reps;Supine Elbow  Flexion: AAROM;Both;5 reps;Supine Elbow Extension: AAROM;Both;5 reps;Supine General Exercises - Lower Extremity Ankle Circles/Pumps: AAROM;Both;Supine;5 reps Heel Slides: AAROM;Both;Supine;5 reps Hip ABduction/ADduction: AAROM;Both;5 reps;Supine      Assessment/Plan    PT Assessment Patient needs continued PT services  PT Diagnosis Generalized weakness   PT Problem List Decreased activity tolerance;Decreased balance;Decreased mobility;Decreased knowledge of use of DME;Decreased safety awareness;Decreased knowledge of precautions;Decreased strength;Decreased range of motion  PT Treatment Interventions DME instruction;Gait training;Functional mobility training;Therapeutic activities;Therapeutic exercise;Balance training;Patient/family education   PT Goals (Current goals can be found in the Care Plan section) Acute Rehab PT Goals Patient Stated Goal: to go home PT Goal Formulation: With patient Time For Goal Achievement: 08/22/13 Potential to Achieve Goals: Good    Frequency Min 3X/week   Barriers to discharge        Co-evaluation               End of Session Equipment Utilized During Treatment: Gait belt;Oxygen (trach) Activity Tolerance: Patient limited by fatigue Patient left: in chair;with call bell/phone within reach;with family/visitor present Nurse Communication: Mobility status;Need for lift equipment         Time: [phone] PT Time Calculation (min): 48 min   Charges:   PT Evaluation $Initial PT Evaluation Tier I: 1 Procedure PT Treatments $Therapeutic Exercise: 8-22 mins $Therapeutic Activity: 23-37 mins   PT G Codes:          INGOLD,Mahlet Jergens 2013-08-18, 12:53 PM [facility] Acute Rehabilitation [phone] (pager)

## 2013-08-15 NOTE — Progress Notes (Signed)
Fentanyl drip was stopped in prior shift.  Took remainder of drip down and wasted approx. 125 mg in sink.  Ricki Rodriguez, RN witnessed waste of remaining fentanyl.

## 2013-08-15 NOTE — Op Note (Signed)
Reviewed [name]

## 2013-08-15 NOTE — Progress Notes (Signed)
Pt not on vent at this time.

## 2013-08-15 NOTE — Consult Note (Addendum)
WOC ostomy follow up [facility] following for assessment and plan of care to abd wound. Stomal assessment/size: 1 3/4  inch, pink and viable, above skin Peristomal assessment: skin intact surrounding stoma.  [place] at 9:00 o'clock near abd wound  Output : 50cc of brown liquid stool. Ostomy pouching: 1 piece urostomy pouch with barrier ring to facilitate drainage and decrease chance of leakage. This pouching system has been effective during the past week to avoid soiling into the abd wound. Supplies at bedside for nurses. Education provided: No family at bedside. Pt on vent and not responsive. Supplies ordered to bedside for staff nurses. Julien Girt MSN, RN, [place], [place], [place]

## 2013-08-15 NOTE — Progress Notes (Addendum)
Rehab Admissions Coordinator Note:  Patient was screened by Cleatrice Burke for appropriateness for an Inpatient Acute Rehab Consult per PT recommendation.   At this time, we are recommending Inpatient Rehab consult. Please place order for inpt rehab consult as well as OT eval. Pt has [employer].   Cleatrice Burke 08/15/2013, 1:05 PM  I can be reached at [phone].

## 2013-08-15 NOTE — Evaluation (Signed)
[facility] Speaking [facility] - Evaluation Patient Details  Name: [name] MRN: [id number] Date of Birth: [birth year]  Today's Date: 08/15/2013 Time: 1436-1500 SLP Time Calculation (min): 24 min  Past Medical History:  Past Medical History  Diagnosis Date  . Carotid artery disease     a. 60-70% bilat ICA stenosis by dopplers 05/2012.  Marland Kitchen Other and unspecified hyperlipidemia   . CAD (coronary artery disease)     a. Mod dz 2010 initially mgd medically. b. 12/2012 - angina s/p PTCA/DES to mid-circumflex, PTCA/DES to first OM.   Marland Kitchen Hypertension   . Heart murmur   . Depression   . Barrett's esophagus   . Arthritis    Past Surgical History:  Past Surgical History  Procedure Laterality Date  . Appendectomy    . Cholecystectomy    . Colon resection    . 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 Rotator cuff repair Left   . Abdominal hysterectomy    . Colon surgery    . Foot surgery    . Hand surgery    . Laparotomy N/A 08/01/2013    Procedure: EXPLORATORY LAPAROTOMY;  Surgeon: Harl Bowie, MD;  Location: [place];  Service: General;  Laterality: N/A;  . Partial colectomy N/A 08/01/2013    Procedure: PARTIAL COLECTOMY;  Surgeon: Harl Bowie, MD;  Location: [place];  Service: General;  Laterality: N/A;  . Colostomy N/A 08/01/2013    Procedure: COLOSTOMY;  Surgeon: Harl Bowie, MD;  Location: [place];  Service: General;  Laterality: N/A;  . Bowel resection N/A 08/01/2013    Procedure: SMALL BOWEL RESECTION;  Surgeon: Harl Bowie, MD;  Location: [place];  Service: General;  Laterality: N/A;  . Tracheostomy      feinstein   HPI:  73 y/o female with PMH CAD, HTN, Barrett's esophagus, presented to [facility] ED 7/10 with LLQ pain. Found to have bowel obstruction. To OR for repair 7/13 (Laparotomy, partial colectomy, colostomy, SB resection, repair of enterotomy, lysis of adhesions). Pt was extubated on 7/14 but required  reintubation until trach on 7/24. Trach collar trials on 7/26. Pt also found to have a small ICH on CT.    Assessment / Plan / Recommendation Clinical Impression  Pt tolerated very brief PMSV trials. Secretions following cuff deflation impacted redirection of air to upper airway. With moderate vebal cues and modeling the pt was able to tracheally expectorate signficant blood tinged secretions. With PMSV placement, secretions were audible in the upper airway and were suctioned from the posterior roal cavity with further cues for coughing. As trials progressed pts RR rate increased and pt only tolerate very brief intervals for PMSV placement with clear air trapping. Pt audibly phonated with cough, but could not sustain phonation or repeat single words with max cues. For now, pt may wear PMSV with full supervision from SLP only. Cuff will remain deflated to progress pt at next session. Will f/u tomorrow.     SLP Assessment  Patient needs continued Speech Lanaguage Pathology Services    Follow Up Recommendations  Inpatient Rehab    Frequency and Duration min 3x week  2 weeks   Pertinent Vitals/Pain NA    SLP Goals Potential to Achieve Goals: Good   PMSV Trial  PMSV was placed for: up to one minute intervals, decreasing to 4-5 breath cycle intervals Able to redirect subglottic air through upper  airway: Yes (with cough) Able to Attain Phonation: No attempt to phonate Able to Expectorate Secretions: Yes Level of Secretion Expectoration with PMSV: Oral;Tracheal Respirations During Trial: 40 (28-40 at end of session) SpO2 During Trial: 94 % Pulse During Trial: 120 (80-120 range) Behavior: Alert   Tracheostomy Tube  Additional Tracheostomy Tube Assessment Trach Collar Period: since 7 am Secretion Description: bloody, thin Frequency of Tracheal Suctioning: every 2 hours Level of Secretion Expectoration: Tracheal    Vent Dependency  Vent Dependent: No FiO2 (%): 40 %    Cuff Deflation  Trial Tolerated Cuff Deflation: Yes Length of Time for Cuff Deflation Trial: 15 minutes Behavior: Alert Cuff Deflation Trial - Comments: significant secretions expectorated tracheally   Christne Platts, Katherene Ponto 08/15/2013, 3:13 PM

## 2013-08-15 NOTE — Progress Notes (Addendum)
NUTRITION FOLLOW UP  Intervention:    Increase Vital High Protein by 10 ml every 4 hours to goal rate of 50 ml/h (1200 ml/day) to provide 1200 kcals (70% of estimated needs), 105 gm protein, 1003 ml free water daily.   Nutrition Dx:   Inadequate oral intake related to inability to eat as evidenced by NPO status. Ongoing.  Goal:   Intake to meet >90% of estimated nutrition needs. Met.  Monitor:   TF tolerance/adequacy, weight trend, labs, vent status.  Assessment:   PMHx significant for diverticulitis, CAD, HTN, depression. Admitted with abdominal pain and c/n x 2 weeks. Hx of lysis of adhesions and ovarian cystectomy for SBO in April 2015. Work-up reveals colonic obstruction proximal to descending colon, MD suspects etiology of ischemia vs malignancy.  S/P laparotomy, partial colectomy, colostomy, SB resection, repair of enterotomy, lysis of adhesions on 7/13. Received TPN from 7/13 to 7/20 when TPN was discontinued.  TF was discontinued after trach placement on 7/24 due to bleeding from ostomy site. TF resumed on 7/26 at 20 ml/h. Patient is currently receiving Vital High Protein at 20 ml/h (480 ml per day) to provide 480 kcals (29% of estimated needs), 42 gm protein (46% of estimated needs), 401 ml free water daily. Tolerating well at this time. Discussed patient with RN and in ICU rounds today.   Spoke with Dr. Hulen Skains regarding advancing TF rate, okay for RD to increase rate by 10 ml every 4 hours to goal of 50 ml/h.  Patient is currently on trach collar.  Height: Ht Readings from Last 1 Encounters:  08/02/13 5' (1.524 m)    Ideal weight: 45.5 kg   Weight Status:   Wt Readings from Last 1 Encounters:  08/13/13 160 lb 4.4 oz (72.7 kg)  07/29/13  154 lb 3.2 oz (69.945 kg)  BMI=30.1  Re-estimated needs:  Kcal: 1700 Protein: >/= 91 gm Fluid: 2 L  Skin: abdominal incision  Diet Order: NPO   Intake/Output Summary (Last 24 hours) at 08/15/13 1426 Last data filed at  08/15/13 1300  Gross per 24 hour  Intake 2432.7 ml  Output   3110 ml  Net -677.3 ml    Last BM: 7/27   Labs:   Recent Labs Lab 08/12/13 1137 08/12/13 2333  08/13/13 1137  08/14/13 1642 08/15/13 0130 08/15/13 0432  NA 145 146  < > 146  < > 147 144 143  K 4.0 2.4*  < > 2.2*  < > 3.5* 3.9 3.5*  CL 106 101  < > 98  < > 105 104 103  CO2 26 32  < > 39*  < > 33* 32 32  BUN 25* 20  < > 16  < > _0 CREATININE 0.34* 0.35*  < > 0.36*  < > 0.35* 0.36* 0.38*  CALCIUM 8.6 8.4  < > 8.0*  < > 7.8* 7.6* 7.7*  MG 2.0 1.6  --  1.4*  --   --   --   --   PHOS 2.7 2.4  --  2.2*  --   --   --   --   GLUCOSE 144* 150*  < > 111*  < > 151* 147* 142*  < > = values in this interval not displayed.  CBG (last 3)   Recent Labs  08/15/13 0346 08/15/13 0753 08/15/13 1258  GLUCAP 121* 142* 172*    Scheduled Meds: . antiseptic oral rinse  15 mL Mouth Rinse QID  . chlorhexidine  15 mL Mouth Rinse BID  . feeding supplement (VITAL HIGH PROTEIN)  1,000 mL Per Tube Q24H  . furosemide  40 mg Intravenous 3 times per day  . hydrocortisone sod succinate (SOLU-CORTEF) inj  50 mg Intravenous Q6H  . imipenem-cilastatin  500 mg Intravenous 3 times per day  . insulin aspart  0-20 Units Subcutaneous 6 times per day  . lip balm  1 application Topical BID  . pantoprazole sodium  40 mg Per Tube Q1200  . potassium chloride  40 mEq Per Tube TID  . vancomycin  1,000 mg Intravenous Q12H    Continuous Infusions: . norepinephrine (LEVOPHED) Adult infusion Stopped (08/15/13 1014)    Molli Barrows, RD, LDN, [place] Pager [phone] After Hours Pager [phone]

## 2013-08-16 ENCOUNTER — Inpatient Hospital Stay (HOSPITAL_COMMUNITY): Payer: Medicare HMO

## 2013-08-16 ENCOUNTER — Encounter (HOSPITAL_COMMUNITY): Payer: Self-pay

## 2013-08-16 DIAGNOSIS — R5381 Other malaise: Secondary | ICD-10-CM

## 2013-08-16 DIAGNOSIS — I619 Nontraumatic intracerebral hemorrhage, unspecified: Secondary | ICD-10-CM

## 2013-08-16 DIAGNOSIS — R609 Edema, unspecified: Secondary | ICD-10-CM

## 2013-08-16 LAB — CBC
HCT: 26.7 % — ABNORMAL LOW (ref 36.0–46.0)
HEMOGLOBIN: 8.6 g/dL — AB (ref 12.0–15.0)
MCH: 29.4 pg (ref 26.0–34.0)
MCHC: 32.2 g/dL (ref 30.0–36.0)
MCV: 91.1 fL (ref 78.0–100.0)
Platelets: 268 10*3/uL (ref 150–400)
RBC: 2.93 MIL/uL — ABNORMAL LOW (ref 3.87–5.11)
RDW: 20 % — ABNORMAL HIGH (ref 11.5–15.5)
WBC: 11.8 10*3/uL — ABNORMAL HIGH (ref 4.0–10.5)

## 2013-08-16 LAB — BASIC METABOLIC PANEL
Anion gap: 10 (ref 5–15)
Anion gap: 11 (ref 5–15)
BUN: 24 mg/dL — ABNORMAL HIGH (ref 6–23)
BUN: 25 mg/dL — AB (ref 6–23)
CO2: 31 mEq/L (ref 19–32)
CO2: 33 meq/L — AB (ref 19–32)
Calcium: 7.9 mg/dL — ABNORMAL LOW (ref 8.4–10.5)
Calcium: 7.6 mg/dL — ABNORMAL LOW (ref 8.4–10.5)
Chloride: 105 mEq/L (ref 96–112)
Chloride: 106 mEq/L (ref 96–112)
Creatinine, Ser: 0.37 mg/dL — ABNORMAL LOW (ref 0.50–1.10)
Creatinine, Ser: 0.38 mg/dL — ABNORMAL LOW (ref 0.50–1.10)
GFR calc Af Amer: >90 mL/min (ref >90)
Glucose, Bld: 160 mg/dL — ABNORMAL HIGH (ref 70–99)
Glucose, Bld: 123 mg/dL — ABNORMAL HIGH (ref 70–99)
Potassium: 2.8 mEq/L — CL (ref 3.7–5.3)
Potassium: <2.2 mEq/L — CL (ref 3.7–5.3)
SODIUM: 146 meq/L (ref 137–147)
Sodium: 150 mEq/L — ABNORMAL HIGH (ref 137–147)

## 2013-08-16 LAB — GLUCOSE, CAPILLARY
GLUCOSE-CAPILLARY: 206 mg/dL — AB (ref 70–99)
Glucose-Capillary: 143 mg/dL — ABNORMAL HIGH (ref 70–99)
Glucose-Capillary: 145 mg/dL — ABNORMAL HIGH (ref 70–99)
Glucose-Capillary: 155 mg/dL — ABNORMAL HIGH (ref 70–99)
Glucose-Capillary: 178 mg/dL — ABNORMAL HIGH (ref 70–99)
Glucose-Capillary: 182 mg/dL — ABNORMAL HIGH (ref 70–99)
Glucose-Capillary: 191 mg/dL — ABNORMAL HIGH (ref 70–99)

## 2013-08-16 LAB — PHOSPHORUS: Phosphorus: 3 mg/dL (ref 2.3–4.6)

## 2013-08-16 LAB — MAGNESIUM: Magnesium: 1.7 mg/dL (ref 1.5–2.5)

## 2013-08-16 MED ORDER — POTASSIUM CHLORIDE 10 MEQ/50ML IV SOLN
10.0000 meq | INTRAVENOUS | Status: AC
  Administered 2013-08-16 (×2): 10 meq via INTRAVENOUS
  Filled 2013-08-16 (×2): qty 50

## 2013-08-16 MED ORDER — POTASSIUM CHLORIDE 20 MEQ/15ML (10%) PO LIQD
40.0000 meq | ORAL | Status: AC
  Administered 2013-08-16 (×2): 40 meq
  Filled 2013-08-16 (×2): qty 30

## 2013-08-16 MED ORDER — MAGNESIUM SULFATE 50 % IJ SOLN
4.0000 g | Freq: Once | INTRAVENOUS | Status: AC
  Administered 2013-08-16: 4 g via INTRAVENOUS
  Filled 2013-08-16: qty 8

## 2013-08-16 MED ORDER — POTASSIUM CHLORIDE 10 MEQ/50ML IV SOLN
10.0000 meq | INTRAVENOUS | Status: AC
  Administered 2013-08-16 (×6): 10 meq via INTRAVENOUS
  Filled 2013-08-16 (×3): qty 50

## 2013-08-16 MED ORDER — POTASSIUM CHLORIDE 20 MEQ/15ML (10%) PO LIQD
40.0000 meq | Freq: Once | ORAL | Status: AC
  Administered 2013-08-16: 40 meq via ORAL
  Filled 2013-08-16: qty 30

## 2013-08-16 MED ORDER — HYDROCORTISONE NA SUCCINATE PF 100 MG IJ SOLR
50.0000 mg | Freq: Two times a day (BID) | INTRAMUSCULAR | Status: DC
  Administered 2013-08-16 – 2013-08-17 (×2): 50 mg via INTRAVENOUS
  Filled 2013-08-16 (×4): qty 1

## 2013-08-16 MED ORDER — MAGNESIUM SULFATE 4000MG/100ML IJ SOLN
4.0000 g | Freq: Once | INTRAMUSCULAR | Status: AC
  Filled 2013-08-16: qty 100

## 2013-08-16 MED ORDER — POTASSIUM CHLORIDE 20 MEQ/15ML (10%) PO LIQD
40.0000 meq | Freq: Once | ORAL | Status: AC
  Administered 2013-08-16: 40 meq
  Filled 2013-08-16: qty 30

## 2013-08-16 MED ORDER — FUROSEMIDE 10 MG/ML IJ SOLN
40.0000 mg | Freq: Two times a day (BID) | INTRAMUSCULAR | Status: AC
  Administered 2013-08-16 (×2): 40 mg via INTRAVENOUS
  Filled 2013-08-16 (×3): qty 4

## 2013-08-16 NOTE — Progress Notes (Signed)
Fascia not that much more separated to me.  No overt evisceration.  Coralee Pesa is a good idea, especially now that she is getting out of bed with therapy.  Kathryne Eriksson. Dahlia Bailiff, MD, [place] [phone] [facility]

## 2013-08-16 NOTE — Progress Notes (Signed)
Patient ID: [name], female   DOB: 06/15/1940, 73 y.o.   MRN: [id number] 15 Days Post-Op  Subjective: Patient actually looks really good today. She has been on trach collar for 24 hours.  Objective: Vital signs in last 24 hours: Temp:  [98.1 F (36.7 C)-98.7 F (37.1 C)] 98.7 F (37.1 C) (07/28 0739) Pulse Rate:  [88-115] 110 (07/28 0730) Resp:  [23-40] 28 (07/28 0730) BP: (98-159)/(46-94) 107/48 mmHg (07/28 0700) SpO2:  [90 %-99 %] 94 % (07/28 0730) FiO2 (%):  [35 %-40 %] 35 % (07/28 0730) Last BM Date: 08/16/13  Intake/Output from previous day: 07/27 0701 - 07/28 0700 In: 1892.3 [I.V.:312.3; NG/GT:880; IV Piggyback:700] Out: 5100 [Urine:4075; Stool:1025] Intake/Output this shift:    PE: Abd: soft, nontender, nondistended, ostomy with good feculent output that is not melanotic at all. fascia is beginning to dehisce with widening of her sutures and free space into her abdomen. no evidence of evisceration  Lab Results:   Recent Labs  08/15/13 0432 08/16/13 0405  WBC 14.1* 11.8*  HGB 7.9* 8.6*  HCT 25.0* 26.7*  PLT 268 268   BMET  Recent Labs  08/15/13 0432 08/16/13 0405  NA 143 146  K 3.5* 2.8*  CL 103 105  CO2 32 31  GLUCOSE 142* 160*  BUN 19 24*  CREATININE 0.38* 0.37*  CALCIUM 7.7* 7.9*   PT/INR No results found for this basename: LABPROT, INR,  in the last 72 hours CMP     Component Value Date/Time   NA 146 08/16/2013 0405   K 2.8* 08/16/2013 0405   CL 105 08/16/2013 0405   CO2 31 08/16/2013 0405   GLUCOSE 160* 08/16/2013 0405   BUN 24* 08/16/2013 0405   CREATININE 0.37* 08/16/2013 0405   CALCIUM 7.9* 08/16/2013 0405   PROT 4.2* 08/15/2013 0432   ALBUMIN 1.8* 08/15/2013 0432   AST 19 08/15/2013 0432   ALT 23 08/15/2013 0432   ALKPHOS 60 08/15/2013 0432   BILITOT 1.3* 08/15/2013 0432   GFRNONAA >90 08/16/2013 0405   GFRAA >90 08/16/2013 0405   Lipase     Component Value Date/Time   LIPASE 9* 07/29/2013 1605       Studies/Results: No  results found.  Anti-infectives: Anti-infectives   Start     Dose/Rate Route Frequency Ordered Stop   08/13/13 1600  vancomycin (VANCOCIN) IVPB 1000 mg/200 mL premix     1,000 mg 200 mL/hr over 60 Minutes Intravenous Every 12 hours 08/13/13 1435     08/11/13 1400  vancomycin (VANCOCIN) IVPB 750 mg/150 ml premix  Status:  Discontinued     750 mg 150 mL/hr over 60 Minutes Intravenous Every 12 hours 08/11/13 1138 08/13/13 1435   08/11/13 1300  imipenem-cilastatin (PRIMAXIN) 500 mg in sodium chloride 0.9 % 100 mL IVPB     500 mg 200 mL/hr over 30 Minutes Intravenous 3 times per day 08/11/13 1138     08/11/13 1230  micafungin (MYCAMINE) 100 mg in sodium chloride 0.9 % 100 mL IVPB  Status:  Discontinued     100 mg 100 mL/hr over 1 Hours Intravenous Daily 08/11/13 1138 08/12/13 1132   08/07/13 1400  piperacillin-tazobactam (ZOSYN) IVPB 3.375 g     3.375 g 12.5 mL/hr over 240 Minutes Intravenous 3 times per day 08/07/13 0921 08/10/13 0101   08/03/13 1800  vancomycin (VANCOCIN) IVPB 750 mg/150 ml premix  Status:  Discontinued     750 mg 150 mL/hr over 60 Minutes Intravenous Every 12 hours 08/03/13  0440 08/04/13 0910   08/03/13 0500  vancomycin (VANCOCIN) 1,250 mg in sodium chloride 0.9 % 250 mL IVPB     1,250 mg 166.7 mL/hr over 90 Minutes Intravenous  Once 08/03/13 0440 08/03/13 0646   08/03/13 0500  piperacillin-tazobactam (ZOSYN) IVPB 3.375 g  Status:  Discontinued     3.375 g 12.5 mL/hr over 240 Minutes Intravenous 3 times per day 08/03/13 0440 08/07/13 0921   08/01/13 1245  metroNIDAZOLE (FLAGYL) IVPB 500 mg     500 mg 100 mL/hr over 60 Minutes Intravenous To Surgery 08/01/13 1238 08/01/13 1300   07/31/13 0800  cefoTEtan (CEFOTAN) 2 g in dextrose 5 % 50 mL IVPB     2 g 100 mL/hr over 30 Minutes Intravenous On call to O.R. 07/31/13 0741 08/01/13 0559   07/31/13 0800  cefTRIAXone (ROCEPHIN) 2 g in dextrose 5 % 50 mL IVPB  Status:  Discontinued    Comments:  Pharmacy may adjust dosing  strength / duration / interval for maximal efficacy   2 g 100 mL/hr over 30 Minutes Intravenous Every 24 hours 07/31/13 0744 08/03/13 0405   07/29/13 2100  ciprofloxacin (CIPRO) IVPB 400 mg  Status:  Discontinued     400 mg 200 mL/hr over 60 Minutes Intravenous Every 12 hours 07/29/13 2058 07/31/13 0744   07/29/13 2100  metroNIDAZOLE (FLAGYL) IVPB 500 mg  Status:  Discontinued     500 mg 100 mL/hr over 60 Minutes Intravenous Every 8 hours 07/29/13 2058 08/03/13 0405   07/29/13 2100  fluconazole (DIFLUCAN) IVPB 200 mg  Status:  Discontinued     200 mg 100 mL/hr over 60 Minutes Intravenous Every 24 hours 07/29/13 2058 08/04/13 0911   07/29/13 1930  ciprofloxacin (CIPRO) IVPB 400 mg     400 mg 200 mL/hr over 60 Minutes Intravenous  Once 07/29/13 1915 07/29/13 2031   07/29/13 1930  metroNIDAZOLE (FLAGYL) IVPB 500 mg  Status:  Discontinued     500 mg 100 mL/hr over 60 Minutes Intravenous  Once 07/29/13 1915 07/29/13 2107       Assessment/Plan  1. POD 15, EXPLORATORY LAPAROTOMY (N/A)  PARTIAL COLECTOMY (N/A)  COLOSTOMY (N/A)  SMALL BOWEL RESECTION (N/A)  2. Postoperative ileus, resolved  3. Ventilator dependent respiratory failure, now on trach collar  4. ARDS, improving  5. Leukocytosis, resolving  6. Multilobar PNA  7. Small parenchymal hemorrhage  8. ABL anemia, secondary to GI bleed, resolved  Plan: 1. Patient is currently at goal rate with her tube feeds. She is not having any residuals. 2. She now has some widening of her fascia with evidence of some dehiscence but no evisceration.  i will put an abdominal binder on her today to see if we can help keep things together as much as possible. otherwise the patient is surgically stable.    LOS: 18 days    Rosanna Bickle E 08/16/2013, 7:43 AM Pager: [phone]

## 2013-08-16 NOTE — Evaluation (Signed)
Occupational Therapy Evaluation Patient Details Name: [name] MRN: [id number] DOB: 01/16/1941 Today's Date: 08/16/2013    History of Present Illness Pt admit with colonic obstruction.  SB resection with colostomy.  VDRF and trach.  08/16/13 abdominal wound beginning to dehice.  Abdominal binder ordered.    Clinical Impression   Pt admitted with above. She demonstrates the below listed deficits and will benefit from continued OT to maximize safety and independence with BADLs.  Pt presents to OT with generalized weakness/deconditioning.  She was very fatigued this morning during eval.  She requires total A with all aspects of BADLs.  She was able to participate in AAROM, but did fatigue.   At this time, recommend CIR once endurance improves a bit.       Follow Up Recommendations  CIR;Supervision/Assistance - 24 hour    Equipment Recommendations  None recommended by OT (defer to next venue of care)    Recommendations for Other Services       Precautions / Restrictions Precautions Precautions: Fall;Other (comment) Precaution Comments: trach collar Restrictions Weight Bearing Restrictions: No      Mobility Bed Mobility Overal bed mobility: Needs Assistance;+2 for physical assistance Bed Mobility: Supine to Sit     Supine to sit: +2 for physical assistance;Max assist     General bed mobility comments: Pt needed assist for LEs and for elevation of trunk.    Transfers Overall transfer level: Needs assistance Equipment used: None Transfers: Sit to/from [employer] Sit to Stand: Max assist;+2 physical assistance;From elevated surface   Squat pivot transfers: +2 physical assistance;Max assist;From elevated surface     General transfer comment: Pt too fatigued after PT    Balance Overall balance assessment: Needs assistance Sitting-balance support: Feet supported Sitting balance-Leahy Scale: Zero Sitting balance - Comments: Pt unable to maintain  sitting balance more than a few seconds with min guard.  needs mod to max assist most of time sitting EOB.  Sat a total of 5 minutes at EOB.  Pt much more fatigued today.   Postural control: Posterior lean     Standing balance comment: Unable to achieve full stand.  Clears bottom off bed with pad and +2 total assist.                              ADL Overall ADL's : Needs assistance/impaired Eating/Feeding: NPO   Grooming: Wash/dry hands;Wash/dry face;Sitting;Total assistance   Upper Body Bathing: Total assistance;Sitting   Lower Body Bathing: Total assistance;Bed level   Upper Body Dressing : Total assistance;Sitting   Lower Body Dressing: Total assistance;Bed level   Toilet Transfer: Total assistance   Toileting- Clothing Manipulation and Hygiene: Total assistance;Bed level   Tub/ Shower Transfer: Total assistance   Functional mobility during ADLs: +2 for physical assistance General ADL Comments: Pt very fatigued this date after PT.  She was able to participate in [place].  She is able to assist with ~20% of simple grooming activities     Vision                     Perception     Praxis      Pertinent Vitals/Pain Pt with no complaint of pain      Hand Dominance     Extremity/Trunk Assessment Upper Extremity Assessment Upper Extremity Assessment: RUE deficits/detail;LUE deficits/detail RUE Deficits / Details: grossly 2-/5 throughout  RUE Coordination: decreased fine motor;decreased gross motor LUE  Deficits / Details: grossly 2-/5 throughout. LUE Coordination: decreased fine motor;decreased gross motor   Lower Extremity Assessment Lower Extremity Assessment: Defer to PT evaluation   Cervical / Trunk Assessment Cervical / Trunk Assessment: Other exceptions Cervical / Trunk Exceptions: weakness throughout trunk    Communication Communication Communication: Tracheostomy   Cognition Arousal/Alertness: Lethargic Behavior During Therapy:  Flat affect Overall Cognitive Status: Impaired/Different from baseline Area of Impairment: Attention;Following commands   Current Attention Level: Sustained   Following Commands: Follows one step commands with increased time       General Comments: Cognitive deficits may be impacted by lethargy this date.  She is very slow to follow commands and does not initiate activity    General Comments       Exercises Exercises: General Upper Extremity     Shoulder Instructions      Home Living Family/patient expects to be discharged to:: Private residence Living Arrangements: Spouse/significant other Available Help at Discharge: Family;Available 24 hours/day Type of Home: House Home Access: Stairs to enter [employer] of Steps: 2 Entrance Stairs-Rails: None Home Layout: One level     Bathroom Shower/Tub: Occupational psychologist: Standard     Home Equipment: Wheelchair - manual;Walker - standard;Shower seat (Equipment was pts ex husbands)          Prior Functioning/Environment Level of Independence: Needs assistance  Gait / Transfers Assistance Needed: Pt had to have help  with sit to stand.  Husband stated she ambulated independently PTA.   ADL's / Homemaking Assistance Needed: supervision with B/D        OT Diagnosis: Generalized weakness   OT Problem List: Decreased strength;Decreased range of motion;Decreased activity tolerance;Impaired balance (sitting and/or standing);Decreased coordination;Decreased knowledge of use of DME or AE;Cardiopulmonary status limiting activity;Impaired UE functional use   OT Treatment/Interventions: Self-care/ADL training;Therapeutic exercise;Neuromuscular education;Energy conservation;DME and/or AE instruction;Therapeutic activities;Patient/family education;Balance training    OT Goals(Current goals can be found in the care plan section) Acute Rehab OT Goals OT Goal Formulation: Patient unable to participate in goal  setting Time For Goal Achievement: 08/30/13 Potential to Achieve Goals: Good ADL Goals Pt Will Perform Grooming: with min assist;sitting Pt Will Perform Upper Body Bathing: with mod assist;sitting Pt Will Transfer to Toilet: with mod assist;squat pivot transfer;bedside commode;stand pivot transfer Pt/caregiver will Perform Home Exercise Program: Increased strength;Right Upper extremity;Increased ROM;Independently;With written HEP provided Additional ADL Goal #1: Pt will actively participate in 25 mins therapeutic activity consistently to increase activity tolerance.  Additional ADL Goal #2: Pt will be able to sit EOB with min A x 15 mins in prep for BADLs  OT Frequency: Min 3X/week   Barriers to D/C:    spouse very supportive       Co-evaluation              End of Session Equipment Utilized During Treatment: Other (comment)  Activity Tolerance: Patient limited by fatigue Patient left: in chair;with call bell/phone within reach;with family/visitor present   Time: 9024-0973 OT Time Calculation (min): 16 min Charges:  OT General Charges $OT Visit: 1 Procedure OT Evaluation $Initial OT Evaluation Tier I: 1 Procedure OT Treatments $Therapeutic Exercise: 8-22 mins G-Codes:    John Vasconcelos M 09-01-2013, 12:34 PM

## 2013-08-16 NOTE — Progress Notes (Signed)
eLink Physician-Brief Progress Note Patient Name: [name] DOB: 28-Jul-1940 MRN: [id number]  Date of Service  08/16/2013   HPI/Events of Note   Active diuresis, low k , mag  eICU Interventions  supp k  ,mag   Intervention Category Major Interventions: Electrolyte abnormality - evaluation and management  FEINSTEIN,DANIEL J. 08/16/2013, 5:15 AM

## 2013-08-16 NOTE — Consult Note (Signed)
Physical Medicine and Rehabilitation Consult Reason for Consult: Deconditioned/small bowel obstruction/tracheostomy Referring Physician: Critical care   HPI: [name] is a 73 y.o. right-handed female with history of CAD with stent placement. Presented 07/29/2013 with increased abdominal pain and constipation as well as nausea. Her history started April 10 when she had lysis of adhesions and ovarian cystectomy for small bowel obstruction. She returned back to outside hospital with ongoing abdominal pain diagnosed with colitis discharged home on Levaquin and Flagyl. Ongoing bouts of constipation and abdominal pain she underwent a colonoscopy approximately one week prior to latest admission but they could not cross beyond the splenic flexure according to the husband. CT of the abdomen showed colonic obstruction at the proximal descending colon location. Underwent exploratory laparotomy, partial colectomy, colostomy with small bowel resection repair of enterotomy and lysis of adhesions 08/01/2013 per Dr. Ninfa Linden. Patient with postoperative respiratory distress maintained on the ventilator with fall per critical care medicine. TPN initiated for nutritional support. Hospital course with bouts of confusion a CT of the head 08/11/2013 revealed a subcentimeter hyperdensity in the left frontal lobe consistent with a focal parenchymal hemorrhage. Failure to wean from ventilator underwent tracheostomy 08/14/2013 per Dr. Titus Mould. Maintained on broad-spectrum antibiotics for severe multilobar pneumonia. Echocardiogram completed showing systolic function normal no wall motion abnormalities. Right upper extremity Dopplers for increased swelling showed small axillary vein DVT no anticoagulation at this time due to small ICH. Hospital course acute blood loss anemia latest hemoglobin 8.6. Physical therapy evaluation completed 08/15/2013 with recommendations for physical medicine rehabilitation  consult   Review of Systems  Unable to perform ROS: mental acuity   Past Medical History  Diagnosis Date  . Carotid artery disease     a. 60-70% bilat ICA stenosis by dopplers 05/2012.  Marland Kitchen Other and unspecified hyperlipidemia   . CAD (coronary artery disease)     a. Mod dz 2010 initially mgd medically. b. 12/2012 - angina s/p PTCA/DES to mid-circumflex, PTCA/DES to first OM.   Marland Kitchen Hypertension   . Heart murmur   . Depression   . Barrett's esophagus   . Arthritis    Past Surgical History  Procedure Laterality Date  . Appendectomy    . Cholecystectomy    . Colon resection    . 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 Rotator cuff repair Left   . Abdominal hysterectomy    . Colon surgery    . Foot surgery    . Hand surgery    . Laparotomy N/A 08/01/2013    Procedure: EXPLORATORY LAPAROTOMY;  Surgeon: Harl Bowie, MD;  Location: [facility];  Service: General;  Laterality: N/A;  . Partial colectomy N/A 08/01/2013    Procedure: PARTIAL COLECTOMY;  Surgeon: Harl Bowie, MD;  Location: [facility];  Service: General;  Laterality: N/A;  . Colostomy N/A 08/01/2013    Procedure: COLOSTOMY;  Surgeon: Harl Bowie, MD;  Location: [facility];  Service: General;  Laterality: N/A;  . Bowel resection N/A 08/01/2013    Procedure: SMALL BOWEL RESECTION;  Surgeon: Harl Bowie, MD;  Location: [facility];  Service: General;  Laterality: N/A;  . Tracheostomy      feinstein   History reviewed. No pertinent family history. Social History:  reports that she has been smoking Cigarettes.  She has been smoking about 0.00 packs per day. She has never used smokeless  tobacco. She reports that she does not drink alcohol or use illicit drugs. Allergies:  Allergies  Allergen Reactions  . Pregabalin Swelling    Tongue swelling  . Sulfonamide Derivatives Swelling    Childhood reaction   Medications Prior to Admission  Medication Sig Dispense Refill  .  aspirin EC 81 MG tablet Take 162 mg by mouth daily. Take 2 tablets daily      . atorvastatin (LIPITOR) 80 MG tablet Take 80 mg by mouth every evening.       . clopidogrel (PLAVIX) 75 MG tablet Take 1 tablet (75 mg total) by mouth daily.  90 tablet  2  . dicyclomine (BENTYL) 10 MG capsule Take 10 mg by mouth daily as needed (stomach cramps).       . diphenhydramine-acetaminophen (TYLENOL PM) 25-500 MG TABS Take 1 tablet by mouth at bedtime.       . fish oil-omega-3 fatty acids 1000 MG capsule Take 1,000 mg by mouth daily.       . folic acid (FOLVITE) 355 MCG tablet Take 800 mcg by mouth daily.       Marland Kitchen ipratropium-albuterol (DUONEB) 0.5-2.5 (3) MG/3ML SOLN Take 3 mLs by nebulization every 6 (six) hours as needed (for shortness of breath).      . metoprolol succinate (TOPROL-XL) 25 MG 24 hr tablet Take 25 mg by mouth daily.      . ondansetron (ZOFRAN) 4 MG tablet Take 4 mg by mouth every 8 (eight) hours as needed for nausea or vomiting.      Marland Kitchen oxyCODONE-acetaminophen (PERCOCET/ROXICET) 5-325 MG per tablet Take 1 tablet by mouth daily.      . pantoprazole (PROTONIX) 40 MG tablet Take 40 mg by mouth daily.      . sertraline (ZOLOFT) 100 MG tablet Take 200 mg by mouth daily.       . traMADol (ULTRAM) 50 MG tablet Take 50 mg by mouth every 6 (six) hours as needed for moderate pain.       . nitroGLYCERIN (NITROSTAT) 0.4 MG SL tablet Place 1 tablet (0.4 mg total) under the tongue every 5 (five) minutes as needed for chest pain.  25 tablet  3    Home: Home Living Family/patient expects to be discharged to:: Private residence Living Arrangements: Spouse/significant other Available Help at Discharge: Family;Available 24 hours/day Type of Home: House Home Access: Stairs to enter [employer] of Steps: 2 Entrance Stairs-Rails: None Home Layout: One level Home Equipment: Wheelchair - manual;Walker - standard;Shower seat (Equipment was pts ex husbands)  Functional History: Prior  Function Level of Independence: Needs assistance Gait / Transfers Assistance Needed: Pt had to have help  with sit to stand.  Husband stated she ambulated independently PTA.   ADL's / Homemaking Assistance Needed: supervision with B/D Functional Status:  Mobility: Bed Mobility Overal bed mobility: Needs Assistance;+2 for physical assistance Bed Mobility: Supine to Sit Supine to sit: +2 for physical assistance;Max assist General bed mobility comments: Pt needed assist for LEs and for elevation of trunk.   Transfers Overall transfer level: Needs assistance Equipment used: None Transfers: Squat Pivot Transfers Squat pivot transfers: +2 physical assistance;Max assist;From elevated surface General transfer comment: Used pad to assist with squat pivot transfer.  Pt able to weight bear but only minimally on feet.  Very poor postural stability.        ADL:    Cognition: Cognition Overall Cognitive Status: Within Functional Limits for tasks assessed Orientation Level: Intubated/Tracheostomy - Unable to assess Cognition Arousal/Alertness:  Awake/alert Behavior During Therapy: Anxious Overall Cognitive Status: Within Functional Limits for tasks assessed  Blood pressure 107/48, pulse 98, temperature 98.1 F (36.7 C), temperature source Oral, resp. rate 30, height 5' (1.524 m), weight 72.7 kg (160 lb 4.4 oz), SpO2 97.00%. Physical Exam  Constitutional:  Frail appearing. Can hardly keep eyes open  HENT:  Head: Normocephalic.  Nasogastric tube in place  Eyes: EOM are normal.  Neck:  Trach tube in place  Cardiovascular: Normal rate and regular rhythm.   Respiratory:  Decreased breath sounds  GI: Soft. Bowel sounds are normal.  Colostomy in place  Musculoskeletal:  RUE with 1++ edema  Neurological: She is alert.  Makes good eye contact with examiner.Appropriate yes/no head nods. Follows simple commands.extremely weak. Can lift both arms off the bed, left more quickly than right.      Results for orders placed during the hospital encounter of 07/29/13 (from the past 24 hour(s))  GLUCOSE, CAPILLARY     Status: Abnormal   Collection Time    08/15/13  7:53 AM      Result Value Ref Range   Glucose-Capillary 142 (*) 70 - 99 mg/dL  GLUCOSE, CAPILLARY     Status: Abnormal   Collection Time    08/15/13 12:58 PM      Result Value Ref Range   Glucose-Capillary 172 (*) 70 - 99 mg/dL  GLUCOSE, CAPILLARY     Status: Abnormal   Collection Time    08/15/13  3:38 PM      Result Value Ref Range   Glucose-Capillary 135 (*) 70 - 99 mg/dL  GLUCOSE, CAPILLARY     Status: Abnormal   Collection Time    08/15/13  8:30 PM      Result Value Ref Range   Glucose-Capillary 155 (*) 70 - 99 mg/dL  GLUCOSE, CAPILLARY     Status: Abnormal   Collection Time    08/15/13 11:53 PM      Result Value Ref Range   Glucose-Capillary 143 (*) 70 - 99 mg/dL   Comment 1 Documented in Chart     Comment 2 Notify RN    GLUCOSE, CAPILLARY     Status: Abnormal   Collection Time    08/16/13  4:03 AM      Result Value Ref Range   Glucose-Capillary 191 (*) 70 - 99 mg/dL   Comment 1 Documented in Chart     Comment 2 Notify RN    CBC     Status: Abnormal   Collection Time    08/16/13  4:05 AM      Result Value Ref Range   WBC 11.8 (*) 4.0 - 10.5 K/uL   RBC 2.93 (*) 3.87 - 5.11 MIL/uL   Hemoglobin 8.6 (*) 12.0 - 15.0 g/dL   HCT 26.7 (*) 36.0 - 46.0 %   MCV 91.1  78.0 - 100.0 fL   MCH 29.4  26.0 - 34.0 pg   MCHC 32.2  30.0 - 36.0 g/dL   RDW 20.0 (*) 11.5 - 15.5 %   Platelets 268  150 - 400 K/uL  BASIC METABOLIC PANEL     Status: Abnormal   Collection Time    08/16/13  4:05 AM      Result Value Ref Range   Sodium 146  137 - 147 mEq/L   Potassium 2.8 (*) 3.7 - 5.3 mEq/L   Chloride 105  96 - 112 mEq/L   CO2 31  19 - 32 mEq/L   Glucose, Bld  160 (*) 70 - 99 mg/dL   BUN 24 (*) 6 - 23 mg/dL   Creatinine, Ser 0.37 (*) 0.50 - 1.10 mg/dL   Calcium 7.9 (*) 8.4 - 10.5 mg/dL   GFR calc non Af Amer >90   >90 mL/min   GFR calc Af Amer >90  >90 mL/min   Anion gap 10  5 - 15  MAGNESIUM     Status: None   Collection Time    08/16/13  4:05 AM      Result Value Ref Range   Magnesium 1.7  1.5 - 2.5 mg/dL  PHOSPHORUS     Status: None   Collection Time    08/16/13  4:05 AM      Result Value Ref Range   Phosphorus 3.0  2.3 - 4.6 mg/dL   No results found.  Assessment/Plan: Diagnosis: colonic obstruction.  1. Does the need for close, 24 hr/day medical supervision in concert with the patient's rehab needs make it unreasonable for this patient to be served in a less intensive setting? Potentially 2. Co-Morbidities requiring supervision/potential complications: depression, cad 3. Due to bladder management, bowel management, safety, skin/wound care, disease management, medication administration, pain management and patient education, does the patient require 24 hr/day rehab nursing? Yes 4. Does the patient require coordinated care of a physician, rehab nurse, PT (1-2 hrs/day, 5 days/week), OT (1-2 hrs/day, 5 days/week) and SLP (1-2 hrs/day, 5 days/week) to address physical and functional deficits in the context of the above medical diagnosis(es)? Potentially Addressing deficits in the following areas: balance, endurance, locomotion, strength, transferring, bowel/bladder control, bathing, dressing, feeding, grooming, toileting, cognition, speech, language, swallowing and psychosocial support 5. Can the patient actively participate in an intensive therapy program of at least 3 hrs of therapy per day at least 5 days per week? Potentially 6. The potential for patient to make measurable gains while on inpatient rehab is good 7. Anticipated functional outcomes upon discharge from inpatient rehab are supervision and min assist  with PT, supervision and min assist with OT, supervision with SLP. 8. Estimated rehab length of stay to reach the above functional goals is: 20-28 days 9. Does the patient have adequate  social supports to accommodate these discharge functional goals? Yes 10. Anticipated D/C setting: Home 11. Anticipated post D/C treatments: HH therapy and Outpatient therapy 12. Overall Rehab/Functional Prognosis: excellent  RECOMMENDATIONS: This patient's condition is appropriate for continued rehabilitative care in the following setting: CIR Patient has agreed to participate in recommended program. N/A Note that insurance prior authorization may be required for reimbursement for recommended care.  Comment: Rehab Admissions Coordinator to follow up.  Thanks,  Meredith Staggers, MD, Mellody Drown     08/16/2013

## 2013-08-16 NOTE — Progress Notes (Signed)
Physical Therapy Treatment Patient Details Name: [name] MRN: [id number] DOB: 07/05/1940 Today's Date: 08/16/2013    History of Present Illness Pt admit with colonic obstruction.  SB resection with colostomy.  VDRF and trach.      PT Comments    Pt admitted with above. Pt currently with functional limitations due to balance, strength and endurance deficits. Pt will benefit from skilled PT to increase their independence and safety with mobility to allow discharge to the venue listed below.   Follow Up Recommendations  CIR;Supervision/Assistance - 24 hour     Equipment Recommendations  Other (comment) (TBA)    Recommendations for Other Services Rehab consult     Precautions / Restrictions Precautions Precautions: Fall Restrictions Weight Bearing Restrictions: No    Mobility  Bed Mobility Overal bed mobility: Needs Assistance;+2 for physical assistance Bed Mobility: Supine to Sit     Supine to sit: +2 for physical assistance;Max assist     General bed mobility comments: Pt needed assist for LEs and for elevation of trunk.    Transfers Overall transfer level: Needs assistance Equipment used: None Transfers: Sit to/from [employer] Sit to Stand: Max assist;+2 physical assistance;From elevated surface   Squat pivot transfers: +2 physical assistance;Max assist;From elevated surface     General transfer comment: Used pad to assist with squat pivot transfer.  Pt able to weight bear but only minimally on feet.  Very poor postural stability.  Seemed to assist PT less today.  Also pt would not move UEs or LEs to command today.  Had to assist with ROM and strength more.    Ambulation/Gait                 Stairs            Wheelchair Mobility    Modified Rankin (Stroke Patients Only)       Balance Overall balance assessment: Needs assistance;History of Falls Sitting-balance support: Bilateral upper extremity supported;Feet  supported Sitting balance-Leahy Scale: Zero Sitting balance - Comments: Pt unable to maintain sitting balance more than a few seconds with min guard.  needs mod to max assist most of time sitting EOB.  Sat a total of 5 minutes at EOB.  Pt much more fatigued today.   Postural control: Posterior lean     Standing balance comment: Unable to achieve full stand.  Clears bottom off bed with pad and +2 total assist.                      Cognition Arousal/Alertness: Lethargic Behavior During Therapy: Flat affect Overall Cognitive Status: Within Functional Limits for tasks assessed                      Exercises General Exercises - Upper Extremity Shoulder Flexion: AAROM;Both;5 reps;Supine Elbow Flexion: AAROM;Both;5 reps;Supine Elbow Extension: AAROM;Both;5 reps;Supine General Exercises - Lower Extremity Ankle Circles/Pumps: AAROM;Both;Supine;5 reps Heel Slides: AAROM;Both;Supine;5 reps Hip ABduction/ADduction: AAROM;Both;5 reps;Supine    General Comments General comments (skin integrity, edema, etc.): All 4 extremities swollen.       Pertinent Vitals/Pain VSS with HR elevating slightly to 124 bpm with activity.  O2 sats >90% throughout.      Home Living                      Prior Function            PT Goals (current goals can now be found in the  care plan section) Progress towards PT goals: Progressing toward goals    Frequency  Min 3X/week    PT Plan Current plan remains appropriate    Co-evaluation             End of Session Equipment Utilized During Treatment: Gait belt;Oxygen (trach) Activity Tolerance: Patient limited by fatigue Patient left: in chair;with call bell/phone within reach;with family/visitor present     Time: 1000-1025 PT Time Calculation (min): 25 min  Charges:  $Therapeutic Exercise: 8-22 mins $Therapeutic Activity: 8-22 mins                    G Codes:      INGOLD,Samani Deal 2013/09/09, 10:59 AM [facility] [phone] (pager)

## 2013-08-16 NOTE — Progress Notes (Signed)
Contact Dr Nelda Marseille Potassium level <2.2. Orders to give 40 po potassium and 80 iV

## 2013-08-16 NOTE — Progress Notes (Addendum)
CRITICAL VALUE ALERT  Critical value received:  k 2.8  Date of notification:  08/16/13  Time of notification:  0505  Critical value read back: yes  Nurse who received alert:  Km  MD notified (1st page):  elink  Time MD responded:  0507

## 2013-08-16 NOTE — Progress Notes (Signed)
PULMONARY / CRITICAL CARE MEDICINE   Name: [name] MRN: [id number] DOB: Feb 08, 1940    ADMISSION DATE:  07/29/2013 CONSULTATION DATE:  08/01/2013  REFERRING MD :  Surgery PRIMARY SERVICE: PCCM  CHIEF COMPLAINT:  Bowel obstruction  BRIEF PATIENT DESCRIPTION: 73 y/o female with PMH CAD, HTN, Barrett's esophagus, presented to [facility] ED 7/10 with LLQ pain. Found to have bowel obstruction. To OR for repair 7/13. Remained on vent post-op.   SIGNIFICANT EVENTS / STUDIES: 7/10 Admit, CT Abd - dilated prox colon w/ abrupt transition c/w with obstruction, diverticulitis vs colitis, Surgery c/s 7/12  Empiric Cipro/Flagyl, Cardiology consulted for pre-op evaluation, TPN per pharm 7/13 - Laparotomy, partial colectomy, colostomy, SB resection, repair of enterotomy, lysis of adhesions         - Hgb down to 6.8 >> imp to 10.8 s/p 2u PRBC 7/14 - Extubated, required re-intubation due to hypoxemic resp failure, hypotension, concern for aspiration 7/15 - 2D echo>>> severely dilated LA, normal systolic function   7/82 - Considered ARDS (unclear etiology, ?aspiration), CXR (persistent b/l edema), cont pressors, stable low PCT 1.9 7/17 - CT abd >no bleeding , no abscess, sm fluid in abd/pelvis  7/17 - Ven Dopp>+DVT Rt axillary vein 7/23 -CT Head (subcentimeter focal parenchymal hemorrhage - no SAH or subdural, no acute ischemic change)         - CT chest w/ contrast (severe multilobar pneumonia, moderate b/l pleural effusions, also favor chronicity         - CT Abd/Pelvis (IV, PO) (post-op changes, s/p L-hemicolectomy, transverse colon markedly thickened, unclear if related to acute inflammation/infection vs secondary distention/post-op edema) 7/24 - s/p bedside trach (DF) / bronchoscopy 7/25 - melena with dec Hgb 7/26 - off vent placed on 40% trach collar >> return to vent, CXR (partial improvement b/l edema) 7/27 - improved mental status off cont sedation, req Levo o/n with MAP 40s DC'd in AM, Lasix  40 x 3, Hgb 7.9 7/28 - consult CIR, Hgb 8.6, remains off pressors x 24 hrs, CVP 0-1, CXR (stable / mild worsened b/l pulm edema)      LINES / TUBES: ETT 7/13 >> 7/14, 7/14 >>7/24 Trach (DF) 7/24>>> R IJ CVL 7/14 >>   CULTURES: Blood x 2, 7/15 >> negative Respiratory 7/15 >> few Candida Repeat Blood x2, 7/23 >>> BAL 7/24>>>normal flora>>>  ANTIBIOTICS: Ceftriaxone 7/10 >>> 7/14 Flagyl 7/10 >>> 7/14 Vancomycin 7/14 >>> 7/16 Fluconazole 7/10 >>> 7/16 Zosyn 7/14 >> 7/21 Vancomycin 7/23 >>> Primaxin 7/23 >>> Micafungin 7/23 >>>7/24  SUBJECTIVE/ OVERNIGHT: Per RN, no acute events overnight. Tolerating trach collar x 24 hours, remains off pressors x 24 hours, at goal MAP. Attempted PM valve with SLP but dc'd d/t inc secretions, pending CIR eval. Patient continues with overall improved mental status, following commands, remains non-verbal.  VITAL SIGNS: Temp:  [98.1 F (36.7 C)-98.7 F (37.1 C)] 98.1 F (36.7 C) (07/28 0404) Pulse Rate:  [86-115] 98 (07/28 0700) Resp:  [21-40] 30 (07/28 0700) BP: (98-159)/(46-94) 107/48 mmHg (07/28 0700) SpO2:  [90 %-99 %] 97 % (07/28 0700) FiO2 (%):  [40 %] 40 % (07/28 0400) HEMODYNAMICS: CVP:  [0 mmHg-2 mmHg] 0 mmHg VENTILATOR SETTINGS: Vent Mode:  [-]  FiO2 (%):  [40 %] 40 % INTAKE / OUTPUT: Intake/Output     07/27 0701 - 07/28 0700 07/28 0701 - 07/29 0700   I.V. (mL/kg) 312.3 (4.3)    NG/GT 880    IV Piggyback 700    Total Intake(mL/kg) 1892.3 (  26)    Urine (mL/kg/hr) 4075 (2.3)    Stool 1025 (0.6)    Total Output 5100     Net -3207.8           PHYSICAL EXAMINATION: General:  On trach collar, sitting up in bed, smiles, NAD Neuro:  Awake, alert, nods, follows commands Cardiovascular: Tachycardic,regular rhythm, no murmurs Lungs: Stable bilateral coarse breath sounds with mild improvement, persistent bibasilar crackles. Abdomen:  Soft, abd dressing in place with vertical incision with some noted dehiscence, no drainage or  bleeding. LLQ colostomy functioning (brown liquid stool), +active BS Ext: mostly resolved b/l LE edema, and resolved UE edema, intact peripheral pulses  LABS:  CBC  Recent Labs Lab 08/14/13 1642 08/15/13 0432 08/16/13 0405  WBC 15.9* 14.1* 11.8*  HGB 8.1* 7.9* 8.6*  HCT 25.0* 25.0* 26.7*  PLT 272 268 268   Coag's  Recent Labs Lab 08/11/13 1200  APTT 64*  INR 1.20   BMET  Recent Labs Lab 08/15/13 0130 08/15/13 0432 08/16/13 0405  NA 144 143 146  K 3.9 3.5* 2.8*  CL 104 103 105  CO2 32 32 31  BUN 18 19 24*  CREATININE 0.36* 0.38* 0.37*  GLUCOSE 147* 142* 160*   Electrolytes  Recent Labs Lab 08/12/13 2333  08/13/13 1137  08/15/13 0130 08/15/13 0432 08/16/13 0405  CALCIUM 8.4  < > 8.0*  < > 7.6* 7.7* 7.9*  MG 1.6  --  1.4*  --   --   --  1.7  PHOS 2.4  --  2.2*  --   --   --  3.0  < > = values in this interval not displayed. Sepsis Markers  Recent Labs Lab 08/09/13 2230 08/10/13 0500 08/11/13 0500 08/12/13 0400  LATICACIDVEN 2.0  --  1.7 1.2  PROCALCITON <0.10 <0.10 <0.10  --    ABG  Recent Labs Lab 08/10/13 0340 08/11/13 0954 08/11/13 1336  PHART 7.497* 7.557* 7.497*  PCO2ART 41.1 32.1* 36.1  PO2ART 152.0* 52.0* 53.0*   Liver Enzymes  Recent Labs Lab 08/15/13 0432  AST 19  ALT 23  ALKPHOS 60  BILITOT 1.3*  ALBUMIN 1.8*   Cardiac Enzymes No results found for this basename: TROPONINI, PROBNP,  in the last 168 hours Glucose  Recent Labs Lab 08/15/13 0753 08/15/13 1258 08/15/13 1538 08/15/13 2030 08/15/13 2353 08/16/13 0403  GLUCAP 142* 172* 135* 155* 143* 191*    ASSESSMENT / PLAN:  PULMONARY A: Acute respiratory failure, post op - now s/p trach 7/25 - Remains on trach collar, off vent 7/26 ARDS, unclear etiology, ?aspiration HCAP, concern for re-infection on 7/23 - Improved, afebrile Pulm edema - CXR 7/28 with persistent b/l pulm edema P:   - Continue ATC, maintain as tolerated, remains off vent - Continue  diuresis, in setting of persistent pulm edema, gross +balance - SLP following for PM valve, yesterday dc'd d/t inc secretions - Repeat CXR in AM  CARDIOVASCULAR A:  Shock, Septic - Off pressors 7/27 Tachycardia Volume overload - Gross net +balance  H/o CAD, HTN DVT Rt axillary vein - RUE PICC removed 7/20, anticoagulation off with small ICH P:  - Remains off pressors - Monitor BP, adjusted MAP goal >60 - Wean stress dose steroids - space to hydrocortisone 50mg  q 12 hr - Monitor CVP, last 0-1 - Continue diuresis with Lasix 40mg  IV q 12 hr (good UOP 4L yesterday) - Hold BB, while on pressors and low MAP, change goal map to 60. - Hold plavix, lipitor,  ASA  RENAL A:   Non-gap Metabolic Acidosis - Resolved Severe hypokalemia -2.8 Hypernatremia - Resolved Hypophosphatemia  P:   - KVO IVF - Replete electrolytes as indicated - Re-check BMET @ 1600 given low K 2.8, s/p replaced - Monitor BMET daily - Lasix as above  GASTROINTESTINAL A:   Suspected colitis with colonic obstruction/SBO s/p partial colectomy with new colostomy 7/13 Concern abdominal wound dehiscence post-op Ileus, post-op - Resolved, functioning ostomy Hx of Barrett's esophagus Concern oozing anastomosis  P:   - Surgery following, will start abdominal binder today given concern for wound dehiscence - Continue tube feeds @ goal 50 cc/hr, per surgery - Wound/ostomy care  - Continue PPI PO  HEMATOLOGIC A:   Very small ICH Suspected new post-op GIB anastomosis - Hgb stabilized  Leukocytosis - Improving WBC to 11.8 DVT Rt axillary vein  RUE PICC removed 7/20 VTE ppx P:  - Hold Heparin given concern for GIB and ICH - Monitor CBC - SCD's - Transfuse for Hb < 7 or bleeding - avoid if able with volume overload   INFECTIOUS A:   Colitis, s/p partial colectomy Presumed HCAP vs vap, ?aspiration PNA - No obvious abd source on CT P:   - Cont abx as above  - F/u repeat cultures/ BAL >>> NGTD - Afebrile and WBC  trending down, consider narrowing abx  ENDOCRINE A: Hyperglycemia  No hx DM P:   - Resistant SSI - Continue to wean stress dose steroids, Hydrocortisone 50mg  q 12 hrs  NEUROLOGIC A:   Acute encephalopathy / Delirium - Improved, remains off sedation Small small ICH on CT P:   - RASS goal: 0 - Fentanyl PRN, remains off cont sedation - Consult CIR (screened as appropriate candidate) - Consult PT/OT  Nobie Putnam, DO [place], PGY-2  GLOBAL:   Continued overall improvement, tolerating trach collar x 24 hours, remains off pressors x 24 hours, persistent b/l pulm edema on CXR with gross net +balance, cont Lasix diuresis (good UOP response yesterday), replacing K, goal to narrow abx and taper steroids. Surgery following, cont tube feeds, place abd binder.  Transfer to SDU. CIR to evaluate, continue PT/OT/SLP evals.  Patient seen and examined, agree with above note.  I dictated the care and orders written for this patient under my direction.  Rush Farmer, MD [phone] 08/16/2013  7:21 AM

## 2013-08-16 NOTE — Progress Notes (Signed)
Speech Language Pathology Treatment: [name] Speaking valve  Patient Details Name: [name] MRN: [id number] DOB: 01-16-1941 Today's Date: 08/16/2013 Time: 1950-9326 SLP Time Calculation (min): 26 min  Assessment / Plan / Recommendation Clinical Impression  Pt with decreased tracheal secretions today, but still can only redirect minimal amounts of air to upper airway, likely due to presence of trach with deflated cuff, NG and possible upper airway secretions. SLP provided oral care and suction to base of tongue. Still, PMSV placed for, at best, 10 respiratory cycles resulted in significant air trapping, increased respiratory rate and visible pt discomfort. She had 2 episodes of spontaneous throat clearing, showing slight ability to phonate, but despite max cues pt would not respond verbally, sustain phonation or blow air to move towel. Education provided to husband as well as verbal instruction to never place PMSV himself.  SLP will f/u, but pt unlikely to progress with PMSV or swallowing until trach is downsized or changed to a cuffless. (Dr. Hulen Skains gave ok for BSE when pt ready per SLP).    HPI HPI: [name] with PMH CAD, HTN, Barrett's esophagus, presented to [facility] ED 7/10 with LLQ pain. Found to have bowel obstruction. To OR for repair 7/13 (Laparotomy, partial colectomy, colostomy, SB resection, repair of enterotomy, lysis of adhesions). Pt was extubated on 7/14 but required reintubation until trach on 7/24. Trach collar trials on 7/26. Pt also found to have a small ICH on CT.    Pertinent Vitals NA  SLP Plan  Continue with current plan of care    Recommendations        Patient may use Passy-Muir Speech Valve: with SLP only PMSV Supervision: Full MD: Please consider changing trach tube to : Smaller size;Cuffless       Plan: Continue with current plan of care    GO    [facility], MA CCC-SLP [phone]  Lynann Beaver 08/16/2013, 9:48 AM

## 2013-08-17 ENCOUNTER — Inpatient Hospital Stay (HOSPITAL_COMMUNITY): Payer: Medicare HMO

## 2013-08-17 LAB — BASIC METABOLIC PANEL
ANION GAP: 11 (ref 5–15)
Anion gap: 10 (ref 5–15)
Anion gap: 10 (ref 5–15)
BUN: 29 mg/dL — ABNORMAL HIGH (ref 6–23)
BUN: 30 mg/dL — AB (ref 6–23)
BUN: 28 mg/dL — AB (ref 6–23)
CO2: 32 mEq/L (ref 19–32)
CO2: 31 meq/L (ref 19–32)
CO2: 30 meq/L (ref 19–32)
CREATININE: 0.41 mg/dL — AB (ref 0.50–1.10)
CREATININE: 0.37 mg/dL — AB (ref 0.50–1.10)
Calcium: 7.9 mg/dL — ABNORMAL LOW (ref 8.4–10.5)
Calcium: 7.8 mg/dL — ABNORMAL LOW (ref 8.4–10.5)
Calcium: 8.1 mg/dL — ABNORMAL LOW (ref 8.4–10.5)
Chloride: 108 mEq/L (ref 96–112)
Chloride: 109 mEq/L (ref 96–112)
Chloride: 111 mEq/L (ref 96–112)
Creatinine, Ser: 0.43 mg/dL — ABNORMAL LOW (ref 0.50–1.10)
GFR calc Af Amer: >90 mL/min (ref >90)
GFR calc Af Amer: >90 mL/min (ref >90)
GFR calc non Af Amer: >90 mL/min (ref >90)
GLUCOSE: 156 mg/dL — AB (ref 70–99)
Glucose, Bld: 127 mg/dL — ABNORMAL HIGH (ref 70–99)
Glucose, Bld: 136 mg/dL — ABNORMAL HIGH (ref 70–99)
POTASSIUM: 4 meq/L (ref 3.7–5.3)
POTASSIUM: 3.2 meq/L — AB (ref 3.7–5.3)
POTASSIUM: 3 meq/L — AB (ref 3.7–5.3)
Sodium: 149 mEq/L — ABNORMAL HIGH (ref 137–147)
Sodium: 150 mEq/L — ABNORMAL HIGH (ref 137–147)
Sodium: 153 mEq/L — ABNORMAL HIGH (ref 137–147)

## 2013-08-17 LAB — GLUCOSE, CAPILLARY
GLUCOSE-CAPILLARY: 128 mg/dL — AB (ref 70–99)
GLUCOSE-CAPILLARY: 145 mg/dL — AB (ref 70–99)
GLUCOSE-CAPILLARY: 153 mg/dL — AB (ref 70–99)
Glucose-Capillary: 157 mg/dL — ABNORMAL HIGH (ref 70–99)
Glucose-Capillary: 172 mg/dL — ABNORMAL HIGH (ref 70–99)
Glucose-Capillary: 117 mg/dL — ABNORMAL HIGH (ref 70–99)

## 2013-08-17 LAB — CULTURE, BLOOD (ROUTINE X 2)
Culture: NO GROWTH
Culture: NO GROWTH

## 2013-08-17 LAB — MAGNESIUM: MAGNESIUM: 2.1 mg/dL (ref 1.5–2.5)

## 2013-08-17 LAB — CBC
HCT: 27.9 % — ABNORMAL LOW (ref 36.0–46.0)
HEMOGLOBIN: 8.5 g/dL — AB (ref 12.0–15.0)
MCH: 28.1 pg (ref 26.0–34.0)
MCHC: 30.5 g/dL (ref 30.0–36.0)
MCV: 92.4 fL (ref 78.0–100.0)
Platelets: 262 10*3/uL (ref 150–400)
RBC: 3.02 MIL/uL — ABNORMAL LOW (ref 3.87–5.11)
RDW: 20.3 % — ABNORMAL HIGH (ref 11.5–15.5)
WBC: 9.5 10*3/uL (ref 4.0–10.5)

## 2013-08-17 MED ORDER — FREE WATER
250.0000 mL | Freq: Four times a day (QID) | Status: DC
  Administered 2013-08-17 – 2013-08-19 (×9): 250 mL

## 2013-08-17 MED ORDER — POTASSIUM CHLORIDE 10 MEQ/100ML IV SOLN
10.0000 meq | INTRAVENOUS | Status: AC
  Administered 2013-08-17 (×3): 10 meq via INTRAVENOUS
  Filled 2013-08-17 (×3): qty 100

## 2013-08-17 MED ORDER — FREE WATER
200.0000 mL | Freq: Three times a day (TID) | Status: DC
  Administered 2013-08-17: 200 mL

## 2013-08-17 MED ORDER — POTASSIUM CHLORIDE 20 MEQ/15ML (10%) PO LIQD
40.0000 meq | Freq: Two times a day (BID) | ORAL | Status: AC
  Administered 2013-08-17 – 2013-08-18 (×3): 40 meq
  Filled 2013-08-17 (×3): qty 30

## 2013-08-17 MED ORDER — FUROSEMIDE 10 MG/ML IJ SOLN
40.0000 mg | Freq: Two times a day (BID) | INTRAMUSCULAR | Status: AC
  Administered 2013-08-17 (×2): 40 mg via INTRAVENOUS
  Filled 2013-08-17 (×2): qty 4

## 2013-08-17 NOTE — Progress Notes (Signed)
Rehab admissions - I met with pt and her husband in follow up to rehab MD consult. I explained the possibility of inpatient rehab and pt nodded appropriately at times during this discussion. Pt indicated that she is interested in pursuing this and husband is in agreement. Informational brochures were given and further questions were answered.  Husband gave baseline details and stated that pt was very independent prior to getting sick in mid-June. Husband asked that I contact pt's daughter Sharyn Lull to share information about our rehab program. I spoke with Sharyn Lull by phone and gave information about our rehab program. She is in support of pursuing inpatient rehab.  I will now open her case with [facility]. We will consider possible rehab admission pending her medical clearance, insurance authorization and our bed availability.  Please call me with any questions. Thanks.  Nanetta Batty, PT Rehabilitation Admissions Coordinator [phone]

## 2013-08-17 NOTE — Progress Notes (Signed)
RT suctioned PT x 1 and changed inner cannula. Suctioning resulted in moderate yellow thick- tolerated well.

## 2013-08-17 NOTE — Progress Notes (Signed)
Speech Language Pathology Treatment: [name] Speaking valve  Patient Details Name: [name] MRN: [id number] DOB: 08/06/1940 Today's Date: 08/17/2013 Time: 4098-1191 SLP Time Calculation (min): 15 min  Assessment / Plan / Recommendation Clinical Impression  Skilled Passy-Muir treatment with husband at bedside; SLP reviewed purpose of valve, how it works, cuff etc.  Air trapping present during 5 of 6 trials with valve as well as increased work of breathing and increased anxious appearance.  HR, RR and SpO2 were within normal limits.  Pt. attempted verbalizations given moderate verbal and visual cues/demonstration without ability to phonate.  Highly suspect cuff is prohitbiting adequate airflow to reach vocal cords and inhibiting phonation. Can trach be changed to cuffless or smaller size?   HPI HPI: 73 y/o female with PMH CAD, HTN, Barrett's esophagus, presented to [facility] ED 7/10 with LLQ pain. Found to have bowel obstruction. To OR for repair 7/13 (Laparotomy, partial colectomy, colostomy, SB resection, repair of enterotomy, lysis of adhesions). Pt was extubated on 7/14 but required reintubation until trach on 7/24. Trach collar trials on 7/26. Pt also found to have a small ICH on CT.    Pertinent Vitals WDL  SLP Plan  Continue with current plan of care    Recommendations        Patient may use Passy-Muir Speech Valve: with SLP only PMSV Supervision: Full MD: Please consider changing trach tube to : Smaller size;Cuffless       General recommendations: Rehab consult Oral Care Recommendations: Oral care BID Follow up Recommendations: Inpatient Rehab Plan: Continue with current plan of care          Houston Siren M.Ed [employer] [phone]  08/17/2013

## 2013-08-17 NOTE — Progress Notes (Signed)
PULMONARY / CRITICAL CARE MEDICINE   Name: [name] MRN: [id number] DOB: 11-Dec-1940    ADMISSION DATE:  07/29/2013 CONSULTATION DATE:  08/01/2013  REFERRING MD :  Surgery PRIMARY SERVICE: PCCM  CHIEF COMPLAINT:  Bowel obstruction  BRIEF PATIENT DESCRIPTION: 73 y/o female with PMH CAD, HTN, Barrett's esophagus, presented to [facility] ED 7/10 with LLQ pain. Found to have bowel obstruction. To OR for repair 7/13. Remained on vent post-op.   SIGNIFICANT EVENTS / STUDIES: 7/10 Admit, CT Abd - dilated prox colon w/ abrupt transition c/w with obstruction, diverticulitis vs colitis, Surgery c/s 7/12  Empiric Cipro/Flagyl, Cardiology consulted for pre-op evaluation, TPN per pharm 7/13 - Laparotomy, partial colectomy, colostomy, SB resection, repair of enterotomy, lysis of adhesions         - Hgb down to 6.8 >> imp to 10.8 s/p 2u PRBC 7/14 - Extubated, required re-intubation due to hypoxemic resp failure, hypotension, concern for aspiration 7/15 - 2D echo>>> severely dilated LA, normal systolic function   0/86 - Considered ARDS (unclear etiology, ?aspiration), CXR (persistent b/l edema), cont pressors, stable low PCT 1.9 7/17 - CT abd >no bleeding , no abscess, sm fluid in abd/pelvis  7/17 - Ven Dopp>+DVT Rt axillary vein 7/23 -CT Head (subcentimeter focal parenchymal hemorrhage - no SAH or subdural, no acute ischemic change)         - CT chest w/ contrast (severe multilobar pneumonia, moderate b/l pleural effusions, also favor chronicity         - CT Abd/Pelvis (IV, PO) (post-op changes, s/p L-hemicolectomy, transverse colon markedly thickened, unclear if related to acute inflammation/infection vs secondary distention/post-op edema) 7/24 - s/p bedside trach (DF) / bronchoscopy 7/25 - melena with dec Hgb 7/28 - tol ATC       LINES / TUBES: ETT 7/13 >> 7/14, 7/14 >>7/24 Trach (DF) 7/24>>> R IJ CVL 7/14 >>   CULTURES: Blood x 2, 7/15 >> negative Respiratory 7/15 >> few Candida Repeat  Blood x2, 7/23 >>> BAL 7/24>>>normal flora  ANTIBIOTICS: Ceftriaxone 7/10 >>> 7/14 Flagyl 7/10 >>> 7/14 Vancomycin 7/14 >>> 7/16 Fluconazole 7/10 >>> 7/16 Zosyn 7/14 >> 7/21 Vancomycin 7/23 >>>off Primaxin 7/23 >>>7/29 Micafungin 7/23 >>>7/24  SUBJECTIVE/ OVERNIGHT: No change overnight.  Tol ATC x 48hours.  Awaiting CIR insurance approval. SLP following.    VITAL SIGNS: Temp:  [97.6 F (36.4 C)-98.3 F (36.8 C)] 98 F (36.7 C) (07/29 0803) Pulse Rate:  [38-115] 81 (07/29 0803) Resp:  [22-33] 22 (07/29 0803) BP: (105-137)/(42-66) 137/65 mmHg (07/29 0803) SpO2:  [94 %-99 %] 99 % (07/29 0803) FiO2 (%):  [35 %] 35 % (07/29 0803) Weight:  [160 lb 4.4 oz (72.7 kg)] 160 lb 4.4 oz (72.7 kg) (07/29 0404) HEMODYNAMICS: CVP:  [0 mmHg] 0 mmHg VENTILATOR SETTINGS: Vent Mode:  [-]  FiO2 (%):  [35 %] 35 % INTAKE / OUTPUT: Intake/Output     07/28 0701 - 07/29 0700 07/29 0701 - 07/30 0700   I.V. (mL/kg) 129 (1.8)    NG/GT 910    IV Piggyback 300    Total Intake(mL/kg) 1339 (18.4)    Urine (mL/kg/hr) 4400 (2.5)    Stool     Total Output 4400     Net -3061          Stool Occurrence 1 x     PHYSICAL EXAMINATION: General:  On trach collar, sitting up in bed, NAD Neuro:  Awake, alert, nods, follows commands Cardiovascular: Tachycardic,regular rhythm, no murmurs Lungs: resps even non labored  on ATC, few scattered rhonchi  Abdomen:  Soft, abd dressing in place with vertical incision with some fascial dehiscence, no drainage or bleeding. LLQ colostomy functioning (brown liquid stool), +active BS Ext: mild BLE edema   LABS:  CBC  Recent Labs Lab 08/15/13 0432 08/16/13 0405 08/17/13 0500  WBC 14.1* 11.8* 9.5  HGB 7.9* 8.6* 8.5*  HCT 25.0* 26.7* 27.9*  PLT 268 268 262   Coag's  Recent Labs Lab 08/11/13 1200  APTT 64*  INR 1.20   BMET  Recent Labs Lab 08/16/13 1350 08/16/13 2330 08/17/13 0500  NA 150* 149* 150*  K <2.2* 3.2* 3.0*  CL 106 108 109  CO2 33* 31  30  BUN 25* 28* 29*  CREATININE 0.38* 0.43* 0.41*  GLUCOSE 123* 127* 136*   Electrolytes  Recent Labs Lab 08/12/13 2333  08/13/13 1137  08/16/13 0405 08/16/13 1350 08/16/13 2330 08/17/13 0500  CALCIUM 8.4  < > 8.0*  < > 7.9* 7.6* 7.9* 7.8*  MG 1.6  --  1.4*  --  1.7  --   --  2.1  PHOS 2.4  --  2.2*  --  3.0  --   --   --   < > = values in this interval not displayed. Sepsis Markers  Recent Labs Lab 08/11/13 0500 08/12/13 0400  LATICACIDVEN 1.7 1.2  PROCALCITON <0.10  --    ABG  Recent Labs Lab 08/11/13 0954 08/11/13 1336  PHART 7.557* 7.497*  PCO2ART 32.1* 36.1  PO2ART 52.0* 53.0*   Liver Enzymes  Recent Labs Lab 08/15/13 0432  AST 19  ALT 23  ALKPHOS 60  BILITOT 1.3*  ALBUMIN 1.8*   Cardiac Enzymes No results found for this basename: TROPONINI, PROBNP,  in the last 168 hours Glucose  Recent Labs Lab 08/16/13 1221 08/16/13 1639 08/16/13 2015 08/17/13 0030 08/17/13 0434 08/17/13 0800  GLUCAP 182* 145* 206* 128* 145* 157*    ASSESSMENT / PLAN:  PULMONARY A: Acute respiratory failure, post op - now s/p trach 7/25 - Remains on trach collar, off vent 7/26 ARDS, unclear etiology, ?aspiration HCAP, concern for re-infection on 7/23 - Improved, afebrile Pulm edema - CXR 7/28 with persistent b/l pulm edema P:   - Continue ATC, maintain as tolerated, remains off vent - Continue gentle diuresis in setting of persistent pulm edema, gross +balance but improving  - SLP following for PM valve - has had difficulty with increased secretions  - Repeat CXR in AM   CARDIOVASCULAR A:  Shock, Septic - Off pressors 7/27 Tachycardia Volume overload - Gross net +balance  H/o CAD, HTN DVT Rt axillary vein - RUE PICC removed 7/20, anticoagulation off with small ICH P:  - Monitor BP, adjusted MAP goal >60 - d/c stress steroids  - monitor CVP - Continue diuresis with Lasix 40mg  IV q 12 hr x2 doses- daily assessment  - Hold BB, while on pressors and low  MAP, change goal map to 60 - Hold plavix, lipitor, ASA with recent ??anastomotic bleed  RENAL A:   Severe hypokalemia -2.8 Hypernatremia -  Hypophosphatemia  P:   - KVO IVF - Replete electrolytes as indicated - replete K aggressively - recheck BMET am and PM  - Lasix as above - increase free water 7/29  GASTROINTESTINAL A:   Suspected colitis with colonic obstruction/SBO s/p partial colectomy with new colostomy 7/13 Concern abdominal wound dehiscence post-op Ileus, post-op - Resolved, functioning ostomy Hx of Barrett's esophagus Concern oozing anastomosis  P:   -  Surgery following, cont abdominal binder given concern for wound dehiscence - Continue tube feeds @ goal 50 cc/hr, per surgery - Wound/ostomy care  - Continue PPI PO  HEMATOLOGIC A:   Very small ICH Suspected new post-op GIB anastomosis - Hgb stabilized  DVT Rt axillary vein  RUE PICC removed 7/20 VTE ppx P:  - Hold Heparin given concern for GIB and ICH - Monitor CBC - SCD's - Transfuse for Hb < 7 or bleeding - avoid if able with volume overload   INFECTIOUS A:   Colitis, s/p partial colectomy Presumed HCAP vs vap, ?aspiration PNA - No obvious abd source on CT P:   - d/c abx 7/29 (has had >10 days total, neg cultures, afebrile, wbc wnl)   ENDOCRINE A: Hyperglycemia  No hx DM P:   - Resistant SSI - d/c stress steroids 7/29  NEUROLOGIC A:   Acute encephalopathy / Delirium - Improved, remains off sedation Small small ICH on CT P:   - RASS goal: 0 - Fentanyl PRN, remains off cont sedation - cont pt/ot  - awaiting CIR placement    GLOBAL:   Continued overall improvement, tolerating trach collar x 48 hours, vent out of room.  Persistent gross POS balance and difficult to control hypokalemia. Narrow abx, cont replete K, daily assessment diuresis, d/c stress steroids.  Surgery following.  Cont TF at goal.   May be ready for tx CIR 7/30.    Nickolas Madrid, NP 08/17/2013  10:49 AM Pager: [phone] or [phone]  *Care during the described time interval was provided by me and/or other providers on the critical care team. I have reviewed this patient's available data, including medical history, events of note, physical examination and test results as part of my evaluation.  Attending:  I have seen and examined the patient with nurse practitioner/resident and agree with the note above.   Roselie Awkward, MD [facility] [facility] Pager: [phone] Cell: [phone] If no response, call [phone]

## 2013-08-17 NOTE — Progress Notes (Addendum)
[facility] Progress Note 16 Days Post-Op  Subjective: Patient opens eyes, does not follow commands for me.  Lethargic.  Gurgling breath sounds, but sats are okay.  Objective: Vital signs in last 24 hours: Temp:  [97.6 F (36.4 C)-98.7 F (37.1 C)] 97.9 F (36.6 C) (07/29 0404) Pulse Rate:  [38-115] 99 (07/29 0400) Resp:  [21-33] 26 (07/29 0400) BP: (105-124)/(42-66) 123/66 mmHg (07/29 0404) SpO2:  [94 %-99 %] 99 % (07/29 0400) FiO2 (%):  [35 %] 35 % (07/29 0400) Weight:  [72.7 kg (160 lb 4.4 oz)] 72.7 kg (160 lb 4.4 oz) (07/29 0404) Last BM Date: 08/16/13  Intake/Output from previous day: 07/28 0701 - 07/29 0700 In: 1339 [I.V.:129; NG/GT:910; IV Piggyback:300] Out: 4400 [Urine:4400] Intake/Output this shift:    General: No acute distress  Lungs: [place] trach breath sounds, needs to be suctioned.  Abd: Good bowel sounds.  Wound is still not granulating well.  Some separation of the fascia.  Abdominal binder is too high and on the wrong side.  Extremities: No changes  Neuro: Lethargic, staring into space.  Will not follow commands.  Lab Results:  @LABLAST2 (wbc:2,hgb:2,hct:2,plt:2) BMET  Recent Labs  08/16/13 2330 08/17/13 0500  NA 149* 150*  K 3.2* 3.0*  CL 108 109  CO2 31 30  GLUCOSE 127* 136*  BUN 28* 29*  CREATININE 0.43* 0.41*  CALCIUM 7.9* 7.8*   PT/INR No results found for this basename: LABPROT, INR,  in the last 72 hours ABG No results found for this basename: PHART, PCO2, PO2, HCO3,  in the last 72 hours  Studies/Results: Dg Chest Port 1 View  08/17/2013   CLINICAL DATA:  Shortness of breath.  EXAM: PORTABLE CHEST - 1 VIEW  COMPARISON:  08/16/2013 .  FINDINGS: Tracheostomy tube, right IJ line, NG tube in good anatomic position. Cardiomegaly with pulmonary vascular prominence and bilateral interstitial prominence with bilateral pleural effusions noted. These findings are consistent with congestive heart failure. Similar findings noted on prior study.  No acute bony abnormality.  IMPRESSION: 1.  Line and tube positions stable.  2.  Congestive heart failure with pulmonary interstitial edema.   Electronically Signed   By: Marcello Moores  Register   On: 08/17/2013 07:32   Dg Chest Port 1 View  08/16/2013   CLINICAL DATA:  Followup pulmonary edema  EXAM: PORTABLE CHEST - 1 VIEW  COMPARISON:  08/14/2013  FINDINGS: Cardiac shadow is stable. A tracheostomy tube, nasogastric catheter and right-sided central line are again seen and stable. Persistent central vascular congestion and increased edema is noted. Increasing pleural effusions are seen bilaterally.  IMPRESSION: Worsening edema with pleural effusions.   Electronically Signed   By: Inez Catalina M.D.   On: 08/16/2013 08:07    Anti-infectives: Anti-infectives   Start     Dose/Rate Route Frequency Ordered Stop   08/13/13 1600  vancomycin (VANCOCIN) IVPB 1000 mg/200 mL premix  Status:  Discontinued     1,000 mg 200 mL/hr over 60 Minutes Intravenous Every 12 hours 08/13/13 1435 08/16/13 1243   08/11/13 1400  vancomycin (VANCOCIN) IVPB 750 mg/150 ml premix  Status:  Discontinued     750 mg 150 mL/hr over 60 Minutes Intravenous Every 12 hours 08/11/13 1138 08/13/13 1435   08/11/13 1300  imipenem-cilastatin (PRIMAXIN) 500 mg in sodium chloride 0.9 % 100 mL IVPB     500 mg 200 mL/hr over 30 Minutes Intravenous 3 times per day 08/11/13 1138 08/18/13 2359   08/11/13 1230  micafungin (MYCAMINE) 100 mg in sodium  chloride 0.9 % 100 mL IVPB  Status:  Discontinued     100 mg 100 mL/hr over 1 Hours Intravenous Daily 08/11/13 1138 08/12/13 1132   08/07/13 1400  piperacillin-tazobactam (ZOSYN) IVPB 3.375 g     3.375 g 12.5 mL/hr over 240 Minutes Intravenous 3 times per day 08/07/13 0921 08/10/13 0101   08/03/13 1800  vancomycin (VANCOCIN) IVPB 750 mg/150 ml premix  Status:  Discontinued     750 mg 150 mL/hr over 60 Minutes Intravenous Every 12 hours 08/03/13 0440 08/04/13 0910   08/03/13 0500  vancomycin (VANCOCIN)  1,250 mg in sodium chloride 0.9 % 250 mL IVPB     1,250 mg 166.7 mL/hr over 90 Minutes Intravenous  Once 08/03/13 0440 08/03/13 0646   08/03/13 0500  piperacillin-tazobactam (ZOSYN) IVPB 3.375 g  Status:  Discontinued     3.375 g 12.5 mL/hr over 240 Minutes Intravenous 3 times per day 08/03/13 0440 08/07/13 0921   08/01/13 1245  metroNIDAZOLE (FLAGYL) IVPB 500 mg     500 mg 100 mL/hr over 60 Minutes Intravenous To Surgery 08/01/13 1238 08/01/13 1300   07/31/13 0800  cefoTEtan (CEFOTAN) 2 g in dextrose 5 % 50 mL IVPB     2 g 100 mL/hr over 30 Minutes Intravenous On call to O.R. 07/31/13 0741 08/01/13 0559   07/31/13 0800  cefTRIAXone (ROCEPHIN) 2 g in dextrose 5 % 50 mL IVPB  Status:  Discontinued    Comments:  Pharmacy may adjust dosing strength / duration / interval for maximal efficacy   2 g 100 mL/hr over 30 Minutes Intravenous Every 24 hours 07/31/13 0744 08/03/13 0405   07/29/13 2100  ciprofloxacin (CIPRO) IVPB 400 mg  Status:  Discontinued     400 mg 200 mL/hr over 60 Minutes Intravenous Every 12 hours 07/29/13 2058 07/31/13 0744   07/29/13 2100  metroNIDAZOLE (FLAGYL) IVPB 500 mg  Status:  Discontinued     500 mg 100 mL/hr over 60 Minutes Intravenous Every 8 hours 07/29/13 2058 08/03/13 0405   07/29/13 2100  fluconazole (DIFLUCAN) IVPB 200 mg  Status:  Discontinued     200 mg 100 mL/hr over 60 Minutes Intravenous Every 24 hours 07/29/13 2058 08/04/13 0911   07/29/13 1930  ciprofloxacin (CIPRO) IVPB 400 mg     400 mg 200 mL/hr over 60 Minutes Intravenous  Once 07/29/13 1915 07/29/13 2031   07/29/13 1930  metroNIDAZOLE (FLAGYL) IVPB 500 mg  Status:  Discontinued     500 mg 100 mL/hr over 60 Minutes Intravenous  Once 07/29/13 1915 07/29/13 2107      Assessment/Plan: s/p Procedure(s): EXPLORATORY LAPAROTOMY PARTIAL COLECTOMY COLOSTOMY SMALL BOWEL RESECTION Continue ABX therapy due to Post-op infection Adjust binder to fit lower on abdomen. Continue enteral  nutrition. Check prelabumin level for adequacy of nutritional support. Hypernatremia needs to be addressed.  Is she getting enough free water?  LOS: 19 days   Kathryne Eriksson. Dahlia Bailiff, MD, FACS [phone] [facility] Surgery 08/17/2013

## 2013-08-17 NOTE — Progress Notes (Signed)
Occupational Therapy Treatment Patient Details Name: [name] MRN: [id number] DOB: Sep 23, 1940 Today's Date: 08/17/2013    History of present illness Pt admit with colonic obstruction.  SB resection with colostomy.  VDRF and trach.  08/16/13 abdominal wound beginning to dehice.  Abdominal binder ordered.    OT comments  Pt with very limited participation.  Performed AAROM bil. Shoulder flexion only - she would not perform other exercise despite max encouragement.  She does acknowledge abdominal pain when asked.  RN reports pt has had significant pain today and was premedicated prior to OT.   Follow Up Recommendations  CIR;Supervision/Assistance - 24 hour    Equipment Recommendations  None recommended by OT    Recommendations for Other Services      Precautions / Restrictions Precautions Precautions: Fall;Other (comment) Precaution Comments: trach collar       Mobility Bed Mobility                  Transfers                      Balance                                   ADL                                         General ADL Comments: Pt participated in AAROM bil. shoulders with max cues.  Pt indicates pain in abdomen and mouths "water".  She becomes very frutstrated when told that she can't have any.        Vision                     Perception     Praxis      Cognition   Behavior During Therapy: Flat affect Overall Cognitive Status: Impaired/Different from baseline Area of Impairment: Attention;Following commands   Current Attention Level: Sustained    Following Commands: Follows one step commands with increased time            Extremity/Trunk Assessment               Exercises General Exercises - Upper Extremity Shoulder Flexion: AAROM;Right;Left;10 reps;Supine   Shoulder Instructions       General Comments      Pertinent Vitals/ Pain       See Vitals flow sheet  Home  Living                                          Prior Functioning/Environment              Frequency Min 3X/week     Progress Toward Goals  OT Goals(current goals can now be found in the care plan section)  Progress towards OT goals: Not progressing toward goals - comment (limited participation this date)     Plan Discharge plan remains appropriate    Co-evaluation                 End of Session     Activity Tolerance Patient limited by pain   Patient Left in bed;with call bell/phone within reach;with family/visitor present   Nurse Communication  Time: 3832-9191 OT Time Calculation (min): 9 min  Charges: OT General Charges $OT Visit: 1 Procedure OT Treatments $Therapeutic Exercise: 8-22 mins  Dreden Rivere M 08/17/2013, 1:24 PM

## 2013-08-17 NOTE — Progress Notes (Signed)
[name] for Primaxin Indication: Empiric sepsis coverage  Allergies  Allergen Reactions  . Pregabalin Swelling    Tongue swelling  . Sulfonamide Derivatives Swelling    Childhood reaction    Patient Measurements: Height: 5' (152.4 cm) Weight: 160 lb 4.4 oz (72.7 kg) IBW/kg (Calculated) : 45.5  Vital Signs: Temp: 98 F (36.7 C) (07/29 0803) Temp src: Axillary (07/29 0803) BP: 137/65 mmHg (07/29 0803) Pulse Rate: 81 (07/29 0803)  Labs:  Recent Labs  08/15/13 0432 08/16/13 0405 08/16/13 1350 08/16/13 2330 08/17/13 0500  HGB 7.9* 8.6*  --   --  8.5*  HCT 25.0* 26.7*  --   --  27.9*  PLT 268 268  --   --  262  CREATININE 0.38* 0.37* 0.38* 0.43* 0.41*    Estimated Creatinine Clearance: 54.9 ml/min (by C-G formula based on Cr of 0.41).   Assessment: 58 YOF who presented to the [facility] on 7/10 with LLQ pain and was found to have a bowel obstruction, s/p repair on 7/13. The patient has received several courses of antibiotics this admission.  The patient continues on Primaxin for presumed HCAP vs VAP and to stop on 08/18/13.   Flagyl 7/11 >> 7/14 CTX 7/12 >> 7/14 Fluconazole 7/11 >>7/16 (rash thought to be d/t this) Vancomycin 7/15 >> 7/16; restart 7/23>> 7/28 Zosyn 7/15 >> 7/21 Mica 7/23 >> 7/24 Imi 7/23 >> (7/30)  7/24 BAL>> normal flora 7/23 BCx >> ngtd 7/13 MRSA >> neg 7/15 BCx >> NG 7/15 RCx >> few CA  Goal of Therapy:  Vancomycin level of 15-20 mcg/ml Proper antibiotics for infection/cultures adjusted for renal/hepatic function    Plan:  -Continue Primaxin 500 mg IV every 8 hours with stop date of 08/08/13 -Will followclinical progress  [name], Pharm D 08/17/2013 9:00 AM

## 2013-08-17 NOTE — Progress Notes (Signed)
Rehab admissions - We received insurance denial from [facility] for inpatient rehab. I called and spoke with pt's husband and daughter to share the news of the denial. I will update the patient in person as well.  I called and updated Henrietta with case management who will update social worker that SNF is now appropriate discharge plan. I also spoke with Valetta Fuller, CCM NP to give this update.  At this time, we recommend pursuing skilled nursing facility in light of insurance denial. I will now sign off pt's case.  Please call me with any questions. Thanks.  Nanetta Batty, PT Rehabilitation Admissions Coordinator [phone]

## 2013-08-18 LAB — BASIC METABOLIC PANEL
Anion gap: 8 (ref 5–15)
BUN: 34 mg/dL — ABNORMAL HIGH (ref 6–23)
CO2: 29 meq/L (ref 19–32)
Calcium: 8.1 mg/dL — ABNORMAL LOW (ref 8.4–10.5)
Chloride: 112 mEq/L (ref 96–112)
Creatinine, Ser: 0.35 mg/dL — ABNORMAL LOW (ref 0.50–1.10)
GFR calc Af Amer: >90 mL/min (ref >90)
GFR calc non Af Amer: >90 mL/min (ref >90)
GLUCOSE: 94 mg/dL (ref 70–99)
POTASSIUM: 3.5 meq/L — AB (ref 3.7–5.3)
SODIUM: 149 meq/L — AB (ref 137–147)

## 2013-08-18 LAB — GLUCOSE, CAPILLARY
GLUCOSE-CAPILLARY: 108 mg/dL — AB (ref 70–99)
GLUCOSE-CAPILLARY: 130 mg/dL — AB (ref 70–99)
Glucose-Capillary: 151 mg/dL — ABNORMAL HIGH (ref 70–99)
Glucose-Capillary: 111 mg/dL — ABNORMAL HIGH (ref 70–99)
Glucose-Capillary: 146 mg/dL — ABNORMAL HIGH (ref 70–99)
Glucose-Capillary: 99 mg/dL (ref 70–99)

## 2013-08-18 LAB — PHOSPHORUS: Phosphorus: 2.3 mg/dL (ref 2.3–4.6)

## 2013-08-18 MED ORDER — FUROSEMIDE 10 MG/ML IJ SOLN
40.0000 mg | Freq: Two times a day (BID) | INTRAMUSCULAR | Status: AC
  Administered 2013-08-18 (×2): 40 mg via INTRAVENOUS
  Filled 2013-08-18 (×2): qty 4

## 2013-08-18 MED ORDER — POTASSIUM CHLORIDE 20 MEQ/15ML (10%) PO LIQD
40.0000 meq | Freq: Two times a day (BID) | ORAL | Status: AC
  Administered 2013-08-18 – 2013-08-19 (×3): 40 meq
  Filled 2013-08-18 (×3): qty 30

## 2013-08-18 NOTE — Progress Notes (Signed)
PULMONARY / CRITICAL CARE MEDICINE   Name: [name] MRN: [id number] DOB: [birth year]    ADMISSION DATE:  07/29/2013 CONSULTATION DATE:  08/01/2013  REFERRING MD :  Surgery PRIMARY SERVICE: PCCM  CHIEF COMPLAINT:  Bowel obstruction  BRIEF PATIENT DESCRIPTION: 73 y/o female with PMH CAD, HTN, Barrett's esophagus, presented to [facility] ED 7/10 with LLQ pain. Found to have bowel obstruction. To OR for repair 7/13. Remained on vent post-op.   SIGNIFICANT EVENTS / STUDIES: 7/10 Admit, CT Abd - dilated prox colon w/ abrupt transition c/w with obstruction, diverticulitis vs colitis, Surgery c/s 7/12  Empiric Cipro/Flagyl, Cardiology consulted for pre-op evaluation, TPN per pharm 7/13 - Laparotomy, partial colectomy, colostomy, SB resection, repair of enterotomy, lysis of adhesions         - Hgb down to 6.8 >> imp to 10.8 s/p 2u PRBC 7/14 - Extubated, required re-intubation due to hypoxemic resp failure, hypotension, concern for aspiration 7/15 - 2D echo>>> severely dilated LA, normal systolic function   8/46 - Considered ARDS (unclear etiology, ?aspiration), CXR (persistent b/l edema), cont pressors, stable low PCT 1.9 7/17 - CT abd >no bleeding , no abscess, sm fluid in abd/pelvis  7/17 - Ven Dopp>+DVT Rt axillary vein 7/23 -CT Head (subcentimeter focal parenchymal hemorrhage - no SAH or subdural, no acute ischemic change)         - CT chest w/ contrast (severe multilobar pneumonia, moderate b/l pleural effusions, also favor chronicity         - CT Abd/Pelvis (IV, PO) (post-op changes, s/p L-hemicolectomy, transverse colon markedly thickened, unclear if related to acute inflammation/infection vs secondary distention/post-op edema) 7/24 - s/p bedside trach (DF) / bronchoscopy 7/25 - melena with dec Hgb 7/28 - tol ATC       LINES / TUBES: ETT 7/13 >> 7/14, 7/14 >>7/24 Trach (DF) 7/24>>> R IJ CVL 7/14 >>   CULTURES: Blood x 2, 7/15 >> negative Respiratory 7/15 >> few Candida Repeat  Blood x2, 7/23 >>> BAL 7/24>>>normal flora  ANTIBIOTICS: Ceftriaxone 7/10 >>> 7/14 Flagyl 7/10 >>> 7/14 Vancomycin 7/14 >>> 7/16 Fluconazole 7/10 >>> 7/16 Zosyn 7/14 >> 7/21 Vancomycin 7/23 >>>off Primaxin 7/23 >>>7/29 Micafungin 7/23 >>>7/24  SUBJECTIVE/ OVERNIGHT: No change overnight.  Tol ATC x 48hours.  Awaiting CIR insurance approval. SLP following.    VITAL SIGNS: Temp:  [97.4 F (36.3 C)-98.5 F (36.9 C)] 97.6 F (36.4 C) (07/30 0825) Pulse Rate:  [90-115] 93 (07/30 0825) Resp:  [13-33] 13 (07/30 0825) BP: (98-134)/(36-69) 110/43 mmHg (07/30 0825) SpO2:  [97 %-100 %] 100 % (07/30 0825) FiO2 (%):  [28 %] 28 % (07/30 0825) HEMODYNAMICS:   VENTILATOR SETTINGS: Vent Mode:  [-]  FiO2 (%):  [28 %] 28 % INTAKE / OUTPUT: Intake/Output     07/29 0701 - 07/30 0700 07/30 0701 - 07/31 0700   I.V. (mL/kg)     NG/GT 1600    IV Piggyback 300    Total Intake(mL/kg) 1900 (26.1)    Urine (mL/kg/hr) 3100 (1.8) 350 (1.3)   Stool 700 (0.4)    Total Output 3800 350   Net -1900 -350         PHYSICAL EXAMINATION: General:  On trach collar, sitting up in bed, NAD Neuro:  Awake, alert, nods, follows commands Cardiovascular: Tachycardic,regular rhythm, no murmurs Lungs: resps even non labored on ATC, few scattered rhonchi  Abdomen:  Soft, abd dressing in place with vertical incision with some fascial dehiscence, no drainage or bleeding. LLQ colostomy functioning (brown  liquid stool), +active BS Ext: mild BLE edema   LABS:  CBC  Recent Labs Lab 08/15/13 0432 08/16/13 0405 08/17/13 0500  WBC 14.1* 11.8* 9.5  HGB 7.9* 8.6* 8.5*  HCT 25.0* 26.7* 27.9*  PLT 268 268 262   Coag's  Recent Labs Lab 08/11/13 1200  APTT 64*  INR 1.20   BMET  Recent Labs Lab 08/17/13 0500 08/17/13 1247 08/18/13 0500  NA 150* 153* 149*  K 3.0* 4.0 3.5*  CL 109 111 112  CO2 30 32 29  BUN 29* 30* 34*  CREATININE 0.41* 0.37* 0.35*  GLUCOSE 136* 156* 94   Electrolytes  Recent  Labs Lab 08/13/13 1137  08/16/13 0405  08/17/13 0500 08/17/13 1247 08/18/13 0500  CALCIUM 8.0*  < > 7.9*  < > 7.8* 8.1* 8.1*  MG 1.4*  --  1.7  --  2.1  --   --   PHOS 2.2*  --  3.0  --   --   --  2.3  < > = values in this interval not displayed. Sepsis Markers  Recent Labs Lab 08/12/13 0400  LATICACIDVEN 1.2   ABG  Recent Labs Lab 08/11/13 1336  PHART 7.497*  PCO2ART 36.1  PO2ART 53.0*   Liver Enzymes  Recent Labs Lab 08/15/13 0432  AST 19  ALT 23  ALKPHOS 60  BILITOT 1.3*  ALBUMIN 1.8*   Cardiac Enzymes No results found for this basename: TROPONINI, PROBNP,  in the last 168 hours Glucose  Recent Labs Lab 08/17/13 1203 08/17/13 1627 08/17/13 2054 08/18/13 0017 08/18/13 0423 08/18/13 0829  GLUCAP 172* 117* 153* 108* 151* 130*    ASSESSMENT / PLAN:  PULMONARY A: Acute respiratory failure, post op - now s/p trach 7/25 - Remains on trach collar, off vent 7/26 ARDS, unclear etiology, ?aspiration HCAP, concern for re-infection on 7/23 - Improved, afebrile Pulm edema - CXR 7/28 with persistent b/l pulm edema P:   - Continue ATC, maintain as tolerated, remains off vent - Continue gentle diuresis in setting of persistent pulm edema, gross +balance but improving  - SLP following for PM valve - has had difficulty with increased secretions  - Repeat CXR in AM   CARDIOVASCULAR A:  Shock, Septic - Off pressors 7/27 Tachycardia Volume overload - Gross net +balance  H/o CAD, HTN DVT Rt axillary vein - RUE PICC removed 7/20, anticoagulation off with small ICH P:  - Monitor BP, adjusted MAP goal >60 - d/c stress steroids  - monitor CVP - Continue diuresis with Lasix 40mg  IV q 12 hr x2 doses- daily assessment  - Hold BB, while on pressors and low MAP, change goal map to 60 - Hold plavix, lipitor, ASA with recent ??anastomotic bleed  RENAL A:   Severe hypokalemia -2.8 Hypernatremia -  Hypophosphatemia  P:   - KVO IVF - Replete electrolytes as  indicated - replete K aggressively - recheck BMET am and PM  - Lasix as above - increase free water 7/29  GASTROINTESTINAL A:   Suspected colitis with colonic obstruction/SBO s/p partial colectomy with new colostomy 7/13 Concern abdominal wound dehiscence post-op Ileus, post-op - Resolved, functioning ostomy Hx of Barrett's esophagus Concern oozing anastomosis  P:   - Surgery following, cont abdominal binder given concern for wound dehiscence - Continue tube feeds @ goal 50 cc/hr, per surgery - Wound/ostomy care  - Continue PPI PO  HEMATOLOGIC A:   Very small ICH Suspected new post-op GIB anastomosis - Hgb stabilized  DVT Rt axillary  vein  RUE PICC removed 7/20 VTE ppx P:  - Hold Heparin given concern for GIB and ICH - Monitor CBC - SCD's - Transfuse for Hb < 7 or bleeding - avoid if able with volume overload   INFECTIOUS A:   Colitis, s/p partial colectomy Presumed HCAP vs vap, ?aspiration PNA - No obvious abd source on CT P:   - d/c abx 7/29 (has had >10 days total, neg cultures, afebrile, wbc wnl)   ENDOCRINE A: Hyperglycemia  No hx DM P:   - Resistant SSI - d/c stress steroids 7/29  NEUROLOGIC A:   Acute encephalopathy / Delirium - Improved, remains off sedation Small small ICH on CT P:   - RASS goal: 0 - Fentanyl PRN, remains off cont sedation - cont pt/ot  - awaiting CIR placement    GLOBAL:   Continued overall improvement, tolerating trach collar x 48 hours, vent out of room.  Persistent gross POS balance and difficult to control hypokalemia. Narrow abx, cont replete K, daily assessment diuresis, d/c stress steroids.  Surgery following.  Cont TF at goal.   May be ready for tx CIR 7/30.    Nickolas Madrid, NP 08/18/2013  10:41 AM Pager: [phone] or [phone]  *Care during the described time interval was provided by me and/or other providers on the critical care team. I have reviewed this patient's available data, including medical  history, events of note, physical examination and test results as part of my evaluation.  Attending:  I have seen and examined the patient with nurse practitioner/resident and agree with the note above.   Roselie Awkward, MD [facility] Pager: [phone] Cell: [phone] If no response, call [phone]

## 2013-08-18 NOTE — Progress Notes (Signed)
[facility] Progress Note 17 Days Post-Op  Subjective: Patient much more responsive today than yesterday.  Following commands and appropriately answering questions.  Husband at patient bedside.  Objective: Vital signs in last 24 hours: Temp:  [97.4 F (36.3 C)-98.5 F (36.9 C)] 97.4 F (36.3 C) (07/30 0417) Pulse Rate:  [81-115] 94 (07/30 0600) Resp:  [19-33] 19 (07/30 0450) BP: (98-137)/(36-69) 113/52 mmHg (07/30 0600) SpO2:  [97 %-100 %] 99 % (07/30 0450) FiO2 (%):  [28 %-35 %] 28 % (07/30 0450) Last BM Date: 08/17/13  Intake/Output from previous day: 07/29 0701 - 07/30 0700 In: 1900 [NG/GT:1600; IV Piggyback:300] Out: 3800 [Urine:3100; Stool:700] Intake/Output this shift:    General: No distress  Lungs: Upper respiratory secretions.  I suctioned the patient myself.  Lots of moderately thick secretions.  Patient able to cough well.  Lungs are clear  Abd: Wound continues to need care.  Not for VAC.  Not anymore separated thatn before.  Extremities: The same   Neuro: Much more alert  Lab Results:  @LABLAST2 (wbc:2,hgb:2,hct:2,plt:2) BMET  Recent Labs  08/17/13 1247 08/18/13 0500  NA 153* 149*  K 4.0 3.5*  CL 111 112  CO2 32 29  GLUCOSE 156* 94  BUN 30* 34*  CREATININE 0.37* 0.35*  CALCIUM 8.1* 8.1*   PT/INR No results found for this basename: LABPROT, INR,  in the last 72 hours ABG No results found for this basename: PHART, PCO2, PO2, HCO3,  in the last 72 hours  Studies/Results: Dg Chest Port 1 View  08/17/2013   CLINICAL DATA:  Shortness of breath.  EXAM: PORTABLE CHEST - 1 VIEW  COMPARISON:  08/16/2013 .  FINDINGS: Tracheostomy tube, right IJ line, NG tube in good anatomic position. Cardiomegaly with pulmonary vascular prominence and bilateral interstitial prominence with bilateral pleural effusions noted. These findings are consistent with congestive heart failure. Similar findings noted on prior study. No acute bony abnormality.  IMPRESSION: 1.  Line and  tube positions stable.  2.  Congestive heart failure with pulmonary interstitial edema.   Electronically Signed   By: Marcello Moores  Register   On: 08/17/2013 07:32   Dg Chest Port 1 View  08/16/2013   CLINICAL DATA:  Followup pulmonary edema  EXAM: PORTABLE CHEST - 1 VIEW  COMPARISON:  08/14/2013  FINDINGS: Cardiac shadow is stable. A tracheostomy tube, nasogastric catheter and right-sided central line are again seen and stable. Persistent central vascular congestion and increased edema is noted. Increasing pleural effusions are seen bilaterally.  IMPRESSION: Worsening edema with pleural effusions.   Electronically Signed   By: Inez Catalina M.D.   On: 08/16/2013 08:07   Dg Abd Portable 1v  08/17/2013   CLINICAL DATA:  Left lower quadrant pain  EXAM: PORTABLE ABDOMEN - 1 VIEW  COMPARISON:  08/11/2013  FINDINGS: There is NG tube in place with tip in distal stomach. Nonspecific nonobstructive bowel gas pattern.  IMPRESSION: NG tube in place.  Nonspecific nonobstructive bowel gas pattern.   Electronically Signed   By: Lahoma Crocker M.D.   On: 08/17/2013 15:36    Anti-infectives: Anti-infectives   Start     Dose/Rate Route Frequency Ordered Stop   08/13/13 1600  vancomycin (VANCOCIN) IVPB 1000 mg/200 mL premix  Status:  Discontinued     1,000 mg 200 mL/hr over 60 Minutes Intravenous Every 12 hours 08/13/13 1435 08/16/13 1243   08/11/13 1400  vancomycin (VANCOCIN) IVPB 750 mg/150 ml premix  Status:  Discontinued     750 mg 150 mL/hr over  60 Minutes Intravenous Every 12 hours 08/11/13 1138 08/13/13 1435   08/11/13 1300  imipenem-cilastatin (PRIMAXIN) 500 mg in sodium chloride 0.9 % 100 mL IVPB  Status:  Discontinued     500 mg 200 mL/hr over 30 Minutes Intravenous 3 times per day 08/11/13 1138 08/17/13 1055   08/11/13 1230  micafungin (MYCAMINE) 100 mg in sodium chloride 0.9 % 100 mL IVPB  Status:  Discontinued     100 mg 100 mL/hr over 1 Hours Intravenous Daily 08/11/13 1138 08/12/13 1132   08/07/13 1400   piperacillin-tazobactam (ZOSYN) IVPB 3.375 g     3.375 g 12.5 mL/hr over 240 Minutes Intravenous 3 times per day 08/07/13 0921 08/10/13 0101   08/03/13 1800  vancomycin (VANCOCIN) IVPB 750 mg/150 ml premix  Status:  Discontinued     750 mg 150 mL/hr over 60 Minutes Intravenous Every 12 hours 08/03/13 0440 08/04/13 0910   08/03/13 0500  vancomycin (VANCOCIN) 1,250 mg in sodium chloride 0.9 % 250 mL IVPB     1,250 mg 166.7 mL/hr over 90 Minutes Intravenous  Once 08/03/13 0440 08/03/13 0646   08/03/13 0500  piperacillin-tazobactam (ZOSYN) IVPB 3.375 g  Status:  Discontinued     3.375 g 12.5 mL/hr over 240 Minutes Intravenous 3 times per day 08/03/13 0440 08/07/13 0921   08/01/13 1245  metroNIDAZOLE (FLAGYL) IVPB 500 mg     500 mg 100 mL/hr over 60 Minutes Intravenous To Surgery 08/01/13 1238 08/01/13 1300   07/31/13 0800  cefoTEtan (CEFOTAN) 2 g in dextrose 5 % 50 mL IVPB     2 g 100 mL/hr over 30 Minutes Intravenous On call to O.R. 07/31/13 0741 08/01/13 0559   07/31/13 0800  cefTRIAXone (ROCEPHIN) 2 g in dextrose 5 % 50 mL IVPB  Status:  Discontinued    Comments:  Pharmacy may adjust dosing strength / duration / interval for maximal efficacy   2 g 100 mL/hr over 30 Minutes Intravenous Every 24 hours 07/31/13 0744 08/03/13 0405   07/29/13 2100  ciprofloxacin (CIPRO) IVPB 400 mg  Status:  Discontinued     400 mg 200 mL/hr over 60 Minutes Intravenous Every 12 hours 07/29/13 2058 07/31/13 0744   07/29/13 2100  metroNIDAZOLE (FLAGYL) IVPB 500 mg  Status:  Discontinued     500 mg 100 mL/hr over 60 Minutes Intravenous Every 8 hours 07/29/13 2058 08/03/13 0405   07/29/13 2100  fluconazole (DIFLUCAN) IVPB 200 mg  Status:  Discontinued     200 mg 100 mL/hr over 60 Minutes Intravenous Every 24 hours 07/29/13 2058 08/04/13 0911   07/29/13 1930  ciprofloxacin (CIPRO) IVPB 400 mg     400 mg 200 mL/hr over 60 Minutes Intravenous  Once 07/29/13 1915 07/29/13 2031   07/29/13 1930  metroNIDAZOLE  (FLAGYL) IVPB 500 mg  Status:  Discontinued     500 mg 100 mL/hr over 60 Minutes Intravenous  Once 07/29/13 1915 07/29/13 2107      Assessment/Plan: s/p Procedure(s): EXPLORATORY LAPAROTOMY PARTIAL COLECTOMY COLOSTOMY SMALL BOWEL RESECTION Trach sutures can be removed and we can move forward with PM valve and possibly eting  LOS: 20 days   Kathryne Eriksson. Dahlia Bailiff, MD, FACS [phone] [facility] 08/18/2013

## 2013-08-18 NOTE — Progress Notes (Signed)
Rehab admissions - I received a call from Clayborne Artist, RN  with [facility]. Critical care MD/NP has requested that pt be considered again for inpatient rehab and further clinical information will be sent, documenting pt's progress and activity tolerance on Monday. [employer] is willing to consider pt for possible inpatient rehab with further clinical information needed.  I will now sign back on pt's case and submit further clinical information to [facility] on Monday. I updated Henrietta with case management as well.  Please call me with any questions. Thanks.  Nanetta Batty, PT Rehabilitation Admissions Coordinator [phone]

## 2013-08-18 NOTE — Progress Notes (Signed)
RT note: Pts. sutures removed without difficulty per order placed by Dr. Lake Bells, tolerated well with no c/o.

## 2013-08-18 NOTE — Progress Notes (Signed)
Sutures due for removal/Dr. Hulen Skains aware, placed in note, will plan for after first rounds.

## 2013-08-18 NOTE — Progress Notes (Signed)
Rehab admissions - I met with pt to follow up to share that [facility] has denied inpatient rehab stay. I explained that social worker will follow up with pt/family to plan for skilled nursing. Pt nodded in understanding. Husband was present but remained asleep in the chair. I did speak with husband/daughter by phone yesterday to give this update (see my previous note).  I will now sign off pt's case and recommend SNF at this time.  Thank you.  Nanetta Batty, PT Rehabilitation Admissions Coordinator [phone]

## 2013-08-18 NOTE — Progress Notes (Signed)
Occupational Therapy Treatment Patient Details Name: [name] MRN: [id number] DOB: 1940-09-21 Today's Date: 08/18/2013    History of present illness Pt admit with colonic obstruction.  SB resection with colostomy.  VDRF and trach.  08/16/13 abdominal wound beginning to dehice.  Abdominal binder ordered.    OT comments  Pt able to move to EOB and pivot to chair with less assistance today, but still requires +2 assist.  She was able to sit EOB x 4 mins with min guard assist and performed AAROM bil. UE.  VSS during session.  Pt on 28% FIO2.  Pt indicated pain in abdomen, with activity (binder in place) - pt was premedicated.   Follow Up Recommendations  CIR;Supervision/Assistance - 24 hour    Equipment Recommendations  None recommended by OT    Recommendations for Other Services      Precautions / Restrictions Precautions Precautions: Fall;Other (comment) Precaution Comments: trach collar       Mobility Bed Mobility Overal bed mobility: Needs Assistance;+2 for physical assistance Bed Mobility: Supine to Sit     Supine to sit: Mod assist;+2 for physical assistance     General bed mobility comments: Pt initiated moving LEs to EOB.  She was able to push through Lt. elbow to move herself into seated position   Transfers Overall transfer level: Needs assistance Equipment used: 2 person hand held assist Transfers: Sit to/from [facility] Sit to Stand: Max assist;+2 physical assistance Stand pivot transfers: Max assist;+2 physical assistance       General transfer comment: Pt requires assist to prevent knee buckling on Rt. Pad under hips to assist with lifting hips from bed adn to extend hips while standing.  Assist to shift weight and pivot feet    Balance Overall balance assessment: Needs assistance Sitting-balance support: Feet supported;Single extremity supported Sitting balance-Leahy Scale: Poor Sitting balance - Comments: min guard assist, but  reliant on unilateral UE support to do so   Standing balance support: Bilateral upper extremity supported Standing balance-Leahy Scale: Zero Standing balance comment: Achieved full standign with +2 max A.  Assist to prevent Rt knee from buckling and assist to extend hips                   ADL       Grooming: Wash/dry hands;Wash/dry face;Moderate assistance;Sitting                   Toilet Transfer: Maximal assistance;+2 for physical assistance;Stand-pivot;BSC   Toileting- Clothing Manipulation and Hygiene: Total assistance;+2 for physical assistance;Sit to/from stand       Functional mobility during ADLs: Maximal assistance;+2 for physical assistance General ADL Comments: Pt sat EOB x 4 mins with min guard assist.  Engaged in AAROM bil. shoulders with max encouragement       Vision                     Perception     Praxis      Cognition   Behavior During Therapy: Flat affect Overall Cognitive Status: Impaired/Different from baseline Area of Impairment: Attention;Following commands   Current Attention Level: Sustained    Following Commands: Follows one step commands with increased time       General Comments: cognition difficult to accurately assess due to tracheostomy    Extremity/Trunk Assessment               Exercises General Exercises - Upper Extremity Shoulder Flexion: AAROM;Right;Left;5 reps;Seated (EOB)  Shoulder Instructions       General Comments      Pertinent Vitals/ Pain       See vitals flow sheet.  Home Living                                          Prior Functioning/Environment              Frequency Min 3X/week     Progress Toward Goals  OT Goals(current goals can now be found in the care plan section)  Progress towards OT goals: Progressing toward goals  ADL Goals Pt Will Perform Grooming: with min assist;sitting Pt Will Perform Upper Body Bathing: with mod  assist;sitting Pt Will Transfer to Toilet: with mod assist;squat pivot transfer;bedside commode;stand pivot transfer Pt/caregiver will Perform Home Exercise Program: Increased strength;Right Upper extremity;Increased ROM;Independently;With written HEP provided Additional ADL Goal #1: Pt will actively participate in 25 mins therapeutic activity consistently to increase activity tolerance.  Additional ADL Goal #2: Pt will be able to sit EOB with min A x 15 mins in prep for BADLs  Plan Discharge plan remains appropriate    Co-evaluation                 End of Session Equipment Utilized During Treatment: Oxygen (28% FIO2 via trach collar)   Activity Tolerance Patient tolerated treatment well   Patient Left in chair;with call bell/phone within reach;with family/visitor present   Nurse Communication Mobility status;Need for lift equipment        Time: 1120-1150 OT Time Calculation (min): 30 min  Charges: OT General Charges $OT Visit: 1 Procedure OT Treatments $Therapeutic Activity: 23-37 mins  Ashantia Amaral M 08/18/2013, 1:05 PM

## 2013-08-18 NOTE — Trach Care Team (Signed)
[facility] Progression Note   Patient Details Name: [name] MRN: [id number] DOB: 1940/12/11 Today's Date: 08/18/2013   Tracheostomy Assessment    Tracheostomy Shiley 6 mm Cuffed (Active)  Status Secured 08/18/2013 12:00 PM  Site Assessment Clean;Dry 08/18/2013 12:00 PM  Site Care Cleansed;Dried;Dressing applied 08/18/2013  4:10 AM  Inner Cannula Care Changed/new 08/18/2013  4:10 AM  Ties Assessment Clean;Dry;Secure 08/18/2013 12:00 PM  Cuff pressure (cm) 0 cm 08/18/2013  7:59 AM  Emergency Equipment at bedside Yes 08/18/2013 12:00 PM      Care Needs  Needs trach downsized to cuffless to progress with PMSV and possible swallow eval.    Respiratory Therapy O2 Device: Trach collar FiO2 (%): 28 % SpO2: 100 %    Speech Language Pathology  SLP chart review complete: Patient currently receiving at SLP services Patient may use Passy-Muir Speech Valve: with SLP only PMSV Supervision: Full MD: Please consider changing trach tube to : Smaller size;Cuffless (Needs cuffless and/or smaller trach to use pmsv!!) Recommendations for Other Services: Rehab consult Follow up Recommendations: Inpatient Rehab SLP barriers to progression:  (cuffed trach )   Physical Therapy PT Recommendation/Assessment: Patient needs continued PT services Follow Up Recommendations: CIR;Supervision/Assistance - 24 hour PT equipment: Other (comment) (TBA)    Occupational Therapy OT Recommendation/Assessment: Patient needs continued OT Services Follow Up Recommendations: CIR;Supervision/Assistance - 24 hour OT Equipment: None recommended by OT    Nutritional Patient's Current Diet: Tube feeding Tube Feeding: Vital High Protein Tube Feeding Frequency: Continuous Tube Feeding Strength: Full strength    Case Management/Social Work Level of patient care prior to hospitalization: Home-Self care Living status: Spouse Insurance payer: Medicare ([employer]) Barriers to progression: IV steroids/abx. CIR denied by  insurance Plan to address barriers: CSW referral for ST trach SNF Anticipated discharge disposition: SNF/Assisted  living facility           [facility] Members -  Herbie Baltimore, SLP, Deveron Furlong, [place], Philipp Deputy, RT and Maylon Peppers, RD      Ronesha Heenan, Katherene Ponto 08/18/2013, 2:20 PM

## 2013-08-18 NOTE — Progress Notes (Signed)
Pt up to chair..noted b/p 07-57 systolic..hr 110. No distress noted. Dr. Lake Bells notified.Madaline Brilliant to hold Lasix dose and family inquiring about next dose of pain medicine and will hold that for now also. Reck b/p 99 systolic.

## 2013-08-18 NOTE — Progress Notes (Signed)
Duplicate Roselie Awkward, MD [place] P[facility]M Pager: [phone] Cell: [phone] If no response, call [phone]

## 2013-08-18 NOTE — Progress Notes (Signed)
PULMONARY / CRITICAL CARE MEDICINE   Name: [name] MRN: [id number] DOB: 06/30/1940    ADMISSION DATE:  07/29/2013 CONSULTATION DATE:  08/01/2013  REFERRING MD :  Surgery PRIMARY SERVICE: PCCM  CHIEF COMPLAINT:  Bowel obstruction  BRIEF PATIENT DESCRIPTION: 73 y/o female with PMH CAD, HTN, Barrett's esophagus, presented to [facility] ED 7/10 with LLQ pain. Found to have bowel obstruction. To OR for repair 7/13. Remained on vent post-op.   SIGNIFICANT EVENTS / STUDIES: 7/10 Admit, CT Abd - dilated prox colon w/ abrupt transition c/w with obstruction, diverticulitis vs colitis, Surgery c/s 7/12  Empiric Cipro/Flagyl, Cardiology consulted for pre-op evaluation, TPN per pharm 7/13 - Laparotomy, partial colectomy, colostomy, SB resection, repair of enterotomy, lysis of adhesions         - Hgb down to 6.8 >> imp to 10.8 s/p 2u PRBC 7/14 - Extubated, required re-intubation due to hypoxemic resp failure, hypotension, concern for aspiration 7/15 - 2D echo>>> severely dilated LA, normal systolic function   8/09 - Considered ARDS (unclear etiology, ?aspiration), CXR (persistent b/l edema), cont pressors, stable low PCT 1.9 7/17 - CT abd >no bleeding , no abscess, sm fluid in abd/pelvis  7/17 - Ven Dopp>+DVT Rt axillary vein 7/23 -CT Head (subcentimeter focal parenchymal hemorrhage - no SAH or subdural, no acute ischemic change)         - CT chest w/ contrast (severe multilobar pneumonia, moderate b/l pleural effusions, also favor chronicity         - CT Abd/Pelvis (IV, PO) (post-op changes, s/p L-hemicolectomy, transverse colon markedly thickened, unclear if related to acute inflammation/infection vs secondary distention/post-op edema) 7/24 - s/p bedside trach (DF) / bronchoscopy 7/25 - melena with dec Hgb 7/28 - tol ATC       LINES / TUBES: ETT 7/13 >> 7/14, 7/14 >>7/24 Trach (DF) 7/24>>> R IJ CVL 7/14 >>   CULTURES: Blood x 2, 7/15 >> negative Respiratory 7/15 >> few Candida Repeat  Blood x2, 7/23 >>>neg BAL 7/24>>>normal flora  ANTIBIOTICS: Ceftriaxone 7/10 >>> 7/14 Flagyl 7/10 >>> 7/14 Vancomycin 7/14 >>> 7/16 Fluconazole 7/10 >>> 7/16 Zosyn 7/14 >> 7/21 Vancomycin 7/23 >>>off Primaxin 7/23 >>>7/29 Micafungin 7/23 >>>7/24  SUBJECTIVE/ OVERNIGHT: Insurance denied CIR.  For trach suture removal today.  Failed swallow eval.    VITAL SIGNS: Temp:  [97.4 F (36.3 C)-98.5 F (36.9 C)] 97.6 F (36.4 C) (07/30 0825) Pulse Rate:  [90-115] 93 (07/30 0825) Resp:  [13-33] 13 (07/30 0825) BP: (98-134)/(36-69) 110/43 mmHg (07/30 0825) SpO2:  [97 %-100 %] 100 % (07/30 0825) FiO2 (%):  [28 %] 28 % (07/30 0825) HEMODYNAMICS:   VENTILATOR SETTINGS: Vent Mode:  [-]  FiO2 (%):  [28 %] 28 % INTAKE / OUTPUT: Intake/Output     07/29 0701 - 07/30 0700 07/30 0701 - 07/31 0700   I.V. (mL/kg)     NG/GT 1600    IV Piggyback 300    Total Intake(mL/kg) 1900 (26.1)    Urine (mL/kg/hr) 3100 (1.8) 350 (1.4)   Stool 700 (0.4)    Total Output 3800 350   Net -1900 -350         PHYSICAL EXAMINATION: General:  On trach collar, sitting up in bed, NAD Neuro:  Awake, more alert, nods, follows commands Cardiovascular: s1s2,regular rhythm, no murmurs Lungs: resps even non labored on ATC, few scattered rhonchi, trach c/d Abdomen:  Soft, abd binder in place, no drainage or bleeding. LLQ colostomy functioning, +active BS Ext: mild BLE edema  LABS:  CBC  Recent Labs Lab 08/15/13 0432 08/16/13 0405 08/17/13 0500  WBC 14.1* 11.8* 9.5  HGB 7.9* 8.6* 8.5*  HCT 25.0* 26.7* 27.9*  PLT 268 268 262   Coag's  Recent Labs Lab 08/11/13 1200  APTT 64*  INR 1.20   BMET  Recent Labs Lab 08/17/13 0500 08/17/13 1247 08/18/13 0500  NA 150* 153* 149*  K 3.0* 4.0 3.5*  CL 109 111 112  CO2 30 32 29  BUN 29* 30* 34*  CREATININE 0.41* 0.37* 0.35*  GLUCOSE 136* 156* 94   Electrolytes  Recent Labs Lab 08/13/13 1137  08/16/13 0405  08/17/13 0500 08/17/13 1247  08/18/13 0500  CALCIUM 8.0*  < > 7.9*  < > 7.8* 8.1* 8.1*  MG 1.4*  --  1.7  --  2.1  --   --   PHOS 2.2*  --  3.0  --   --   --  2.3  < > = values in this interval not displayed. Sepsis Markers  Recent Labs Lab 08/12/13 0400  LATICACIDVEN 1.2   ABG  Recent Labs Lab 08/11/13 1336  PHART 7.497*  PCO2ART 36.1  PO2ART 53.0*   Liver Enzymes  Recent Labs Lab 08/15/13 0432  AST 19  ALT 23  ALKPHOS 60  BILITOT 1.3*  ALBUMIN 1.8*   Cardiac Enzymes No results found for this basename: TROPONINI, PROBNP,  in the last 168 hours Glucose  Recent Labs Lab 08/17/13 1203 08/17/13 1627 08/17/13 2054 08/18/13 0017 08/18/13 0423 08/18/13 0829  GLUCAP 172* 117* 153* 108* 151* 130*    ASSESSMENT / PLAN:  PULMONARY A: Acute respiratory failure, post op - now s/p trach 7/25 - Remains on trach collar, off vent 7/26 ARDS, unclear etiology, ?aspiration HCAP, concern for re-infection on 7/23 - Improved, afebrile Pulm edema - CXR 7/28 with persistent b/l pulm edema P:   - Continue ATC, maintain as tolerated, remains off vent  - If cont to tol ATC at all times, consider downsize/cuffless in next few days (hold for now with fairly fresh trach, just off vent 48h) - Continue gentle diuresis in setting of persistent pulm edema, gross +balance but improving  - SLP following for PM valve - has had difficulty with increased secretions  - Repeat CXR in AM   CARDIOVASCULAR A:  Shock, Septic - Off pressors 7/27 Tachycardia Volume overload - Gross net +balance  H/o CAD, HTN DVT Rt axillary vein - RUE PICC removed 7/20, anticoagulation off with small ICH P:  - Monitor BP, adjusted MAP goal >60 - d/c stress steroids  - monitor CVP - Continue diuresis with Lasix 40mg  IV q 12 hr x2 doses- daily assessment  - Hold BB, while on pressors and low MAP, change goal map to 60 - Hold plavix, lipitor, ASA with recent ??anastomotic bleed and Small ICH  RENAL A:   Severe hypokalemia  -2.8 Hypernatremia -  Hypophosphatemia  P:   - KVO IVF - Replete electrolytes as indicated - replete K aggressively - recheck BMET am and PM  - Lasix as above - cont free water   GASTROINTESTINAL A:   Suspected colitis with colonic obstruction/SBO s/p partial colectomy with new colostomy 7/13 Concern abdominal wound dehiscence post-op Ileus, post-op - Resolved, functioning ostomy Hx of Barrett's esophagus Concern oozing anastomosis  P:   - Surgery following, cont abdominal binder given concern for wound dehiscence - Continue tube feeds @ goal 50 cc/hr, per surgery - repeat swallow eval once able to downsize  trach  - Wound/ostomy care  - Continue PPI PO  HEMATOLOGIC A:   Very small ICH Suspected new post-op GIB anastomosis - Hgb stabilized  DVT Rt axillary vein  RUE PICC removed 7/20 VTE ppx P:  - Hold Heparin given concern for GIB and ICH - Monitor CBC - SCD's - Transfuse for Hb < 7 or bleeding - avoid if able with volume overload   INFECTIOUS A:   Colitis, s/p partial colectomy Presumed HCAP vs vap, ?aspiration PNA - No obvious abd source on CT P:   - d/c abx 7/29 (has had >10 days total, neg cultures, afebrile, wbc wnl)   ENDOCRINE A: Hyperglycemia  No hx DM P:   - Resistant SSI - d/c stress steroids 7/29  NEUROLOGIC A:   Acute encephalopathy / Delirium - Improved, remains off sedation Small small ICH on CT P:   - RASS goal: 0 - Fentanyl PRN, remains off cont sedation - cont pt/ot    GLOBAL:   Continued overall improvement, tolerating trach collar x 48 hours, vent out of room.  Persistent gross POS balance is improving.  Hypokalemia has been difficult to control but is also much improved. Now off abx.  Cont TF at goal, surgery following.  Reassess swallow eval once trach downsized in next few days.  Insurance denied CIR.  Begin SNF search but may be able to reconsider (will need peer-peer) CIR once she is a little stronger.   Will ask [facility] to assume  care 7/31.  PCCM will f/u for trach care.    Nickolas Madrid, NP 08/18/2013  10:28 AM Pager: [phone] or [phone]  *Care during the described time interval was provided by me and/or other providers on the critical care team. I have reviewed this patient's available data, including medical history, events of note, physical examination and test results as part of my evaluation.

## 2013-08-18 NOTE — Progress Notes (Signed)
Physical Therapy Treatment Patient Details Name: [name] MRN: [id number] DOB: Sep 01, 1940 Today's Date: 08/18/2013    History of Present Illness Pt admit with colonic obstruction.  SB resection with colostomy.  VDRF and trach.  08/16/13 abdominal wound beginning to dehice.  Abdominal binder ordered.     PT Comments    Pt progressing slowly towards physical therapy goals. Level of interaction in session was low today, and overall rehab effort was poor. Pt demonstrated minimal active movement during session, and pt required increased assist to reposition UE's/LE's in bed. Had been sitting up for a few hours prior to PT arrival and may have been fatigued, but unable to convey this when asked. Per chart review, CIR has been denied due to insurance, and discharge disposition was updated to SNF. Frequency updated to appropriately reflect this as well.   Follow Up Recommendations  SNF;Supervision/Assistance - 24 hour (Insurance denied CIR )     Equipment Recommendations  Other (comment) (TBA)    Recommendations for Other Services       Precautions / Restrictions Precautions Precautions: Fall Precaution Comments: trach collar Restrictions Weight Bearing Restrictions: No    Mobility  Bed Mobility Overal bed mobility: Needs Assistance;+2 for physical assistance Bed Mobility: Sit to Supine       Sit to supine: Max assist;+2 for physical assistance   General bed mobility comments: Pt with little controlled descent when transitioning to supine. Relied on therapist and tech to control shoulders down to bed, and was not able to elevate LE's up onto bed.   Transfers Overall transfer level: Needs assistance Equipment used: 2 person hand held assist Transfers: Sit to/from [facility] Sit to Stand: Max assist;+2 physical assistance Stand pivot transfers: Max assist;+2 physical assistance       General transfer comment: Pt requires assist to prevent knee  buckling on Rt. Pad under hips to assist with lifting hips from chair and to extend hips while standing.  Assist to shift weight and pivot feet to bed.  Ambulation/Gait             General Gait Details: Unable at this time.    Stairs            Wheelchair Mobility    Modified Rankin (Stroke Patients Only)       Balance Overall balance assessment: History of Falls;Needs assistance Sitting-balance support: Feet supported;Bilateral upper extremity supported Sitting balance-Leahy Scale: Poor Sitting balance - Comments: Requires UE support to maintain sitting balance. Requires assist to move UE position on arm rests of chair, otherwise trunk will fall to back of chair.  Postural control: Posterior lean Standing balance support: Bilateral upper extremity supported;During functional activity Standing balance-Leahy Scale: Zero Standing balance comment: Unable to assist with standing balance without +2 assist at this time.                     Cognition Arousal/Alertness: Awake/alert Behavior During Therapy: Flat affect Overall Cognitive Status: Impaired/Different from baseline Area of Impairment: Attention;Following commands   Current Attention Level: Sustained   Following Commands: Follows one step commands with increased time       General Comments: cognition difficult to accurately assess due to tracheostomy    Exercises      General Comments        Pertinent Vitals/Pain Vitals stable throughout session.     Home Living  Prior Function            PT Goals (current goals can now be found in the care plan section) Acute Rehab PT Goals Patient Stated Goal: None stated during PT session.  PT Goal Formulation: Patient unable to participate in goal setting Time For Goal Achievement: 08/22/13 Potential to Achieve Goals: Fair Progress towards PT goals: Progressing toward goals    Frequency  Min 2X/week    PT Plan  Discharge plan needs to be updated;Frequency needs to be updated    Co-evaluation             End of Session Equipment Utilized During Treatment: Gait belt;Oxygen Activity Tolerance: Patient limited by fatigue;Patient limited by lethargy Patient left: in bed;with call bell/phone within reach;with family/visitor present     Time: 1343-1406 PT Time Calculation (min): 23 min  Charges:  $Therapeutic Activity: 23-37 mins                    G Codes:      Jolyn Lent 09/15/2013, 4:11 PM  Jolyn Lent, PT, DPT Acute Rehabilitation Services Pager: [phone]

## 2013-08-19 DIAGNOSIS — R5381 Other malaise: Secondary | ICD-10-CM | POA: Diagnosis present

## 2013-08-19 DIAGNOSIS — J9601 Acute respiratory failure with hypoxia: Secondary | ICD-10-CM | POA: Diagnosis present

## 2013-08-19 DIAGNOSIS — E860 Dehydration: Secondary | ICD-10-CM | POA: Diagnosis present

## 2013-08-19 DIAGNOSIS — D649 Anemia, unspecified: Secondary | ICD-10-CM | POA: Diagnosis not present

## 2013-08-19 DIAGNOSIS — E46 Unspecified protein-calorie malnutrition: Secondary | ICD-10-CM

## 2013-08-19 DIAGNOSIS — I82A19 Acute embolism and thrombosis of unspecified axillary vein: Secondary | ICD-10-CM

## 2013-08-19 DIAGNOSIS — I10 Essential (primary) hypertension: Secondary | ICD-10-CM | POA: Diagnosis present

## 2013-08-19 DIAGNOSIS — E870 Hyperosmolality and hypernatremia: Secondary | ICD-10-CM | POA: Diagnosis present

## 2013-08-19 DIAGNOSIS — I609 Nontraumatic subarachnoid hemorrhage, unspecified: Secondary | ICD-10-CM

## 2013-08-19 DIAGNOSIS — I82A11 Acute embolism and thrombosis of right axillary vein: Secondary | ICD-10-CM | POA: Diagnosis present

## 2013-08-19 LAB — CBC
HCT: 28.4 % — ABNORMAL LOW (ref 36.0–46.0)
Hemoglobin: 8.8 g/dL — ABNORMAL LOW (ref 12.0–15.0)
MCH: 29.3 pg (ref 26.0–34.0)
MCHC: 31 g/dL (ref 30.0–36.0)
MCV: 94.7 fL (ref 78.0–100.0)
PLATELETS: 211 10*3/uL (ref 150–400)
RBC: 3 MIL/uL — AB (ref 3.87–5.11)
RDW: 19.9 % — ABNORMAL HIGH (ref 11.5–15.5)
WBC: 9.2 10*3/uL (ref 4.0–10.5)

## 2013-08-19 LAB — GLUCOSE, CAPILLARY
GLUCOSE-CAPILLARY: 120 mg/dL — AB (ref 70–99)
GLUCOSE-CAPILLARY: 111 mg/dL — AB (ref 70–99)
Glucose-Capillary: 118 mg/dL — ABNORMAL HIGH (ref 70–99)
Glucose-Capillary: 135 mg/dL — ABNORMAL HIGH (ref 70–99)
Glucose-Capillary: 141 mg/dL — ABNORMAL HIGH (ref 70–99)
Glucose-Capillary: 180 mg/dL — ABNORMAL HIGH (ref 70–99)

## 2013-08-19 LAB — BASIC METABOLIC PANEL
Anion gap: 8 (ref 5–15)
BUN: 35 mg/dL — AB (ref 6–23)
CO2: 28 mEq/L (ref 19–32)
Calcium: 8.4 mg/dL (ref 8.4–10.5)
Chloride: 115 mEq/L — ABNORMAL HIGH (ref 96–112)
Creatinine, Ser: 0.37 mg/dL — ABNORMAL LOW (ref 0.50–1.10)
GFR calc Af Amer: >90 mL/min (ref >90)
GLUCOSE: 129 mg/dL — AB (ref 70–99)
Potassium: 3.8 mEq/L (ref 3.7–5.3)
Sodium: 151 mEq/L — ABNORMAL HIGH (ref 137–147)

## 2013-08-19 MED ORDER — FREE WATER
350.0000 mL | Freq: Four times a day (QID) | Status: DC
  Administered 2013-08-19 – 2013-08-25 (×16): 350 mL

## 2013-08-19 NOTE — Progress Notes (Signed)
Discussed downsizing of tracheostomy with Dr. Lake Bells, will wait until Monday to reassess patient for decannulation.   Noe Gens, NP-C [facility] Pgr: [phone] or [phone]  Agree  Roselie Awkward, MD [facility] Pager: [phone] Cell: [phone] If no response, call [phone]

## 2013-08-19 NOTE — Progress Notes (Signed)
[facility]  [name] ZOX:[id number] DOB: December 28, 1940 DOA: 07/29/2013 PCP: Laverna Peace, NP   Brief narrative: 73 y.o. Female who presented with a 2 week history of increased abdominal pain with constipation and nausea but no vomiting.   Her history started on April 10 when she had lysis of adhesions and ovarian cystectomy for small bowel obstruction. Her family reported that she had a complication of anesthesia during the surgery. She returned to the hospital few days later with abdominal pain that was diagnosed as colitis and she was discharged home on Levaquin and Flagyl. Her pain improved slightly but she started having abdominal pain again and was admitted to [facility] and was continued on antibiotics with no need for surgical intervention. Gastroenterology followed the patient and did not recommend aggressive intervention so she was discharged home. She started having worsening of her abdominal pain over the previous 2 weeks associated with nausea but no vomiting. She reported severe constipation and had not had any bowel movement for 1 week. Patient had a colonoscopy one week prior to presentation but they could not cross beyond the splenic flexure according to the husband.   She presented to the ER on 710 because of the above symptoms. CT of the abdomen was done and showed obstruction at the level of the colon with small bowel distention. Surgery was consulted. She went to the OR for repair 7/13 and remained on the vent post-op.  SIGNIFICANT EVENTS / STUDIES:  7/10 Admit, CT Abd - dilated prox colon w/ abrupt transition c/w with obstruction, diverticulitis vs colitis, Surgery c/s  7/12 Empiric Cipro/Flagyl, Cardiology consulted for pre-op evaluation, TPN per pharm  7/13 - Laparotomy, partial colectomy, colostomy, SB resection, repair of enterotomy, lysis of adhesions  - Hgb down to 6.8 >> imp to 10.8 s/p 2u PRBC  7/14 - Extubated,  required re-intubation due to hypoxemic resp failure, hypotension, concern for aspiration  7/15 - 2D echo>>> severely dilated LA, normal systolic function  8/11 - Considered ARDS (unclear etiology, ?aspiration), CXR (persistent b/l edema), cont pressors, stable low PCT 1.9  7/17 - CT abd >no bleeding , no abscess, sm fluid in abd/pelvis  7/17 - Ven Dopp>+DVT Rt axillary vein  7/23 -CT Head (subcentimeter focal parenchymal hemorrhage - no SAH or subdural, no acute ischemic change)  - CT chest w/ contrast (severe multilobar pneumonia, moderate b/l pleural effusions, also favor chronicity  - CT Abd/Pelvis (IV, PO) (post-op changes, s/p L-hemicolectomy, transverse colon markedly thickened, unclear if related to acute inflammation/infection vs secondary distention/post-op edema)  7/24 - s/p bedside trach (DF) / bronchoscopy  7/25 - melena with dec Hgb  7/28 - tol ATC   HPI/Subjective: Very sleepy and appears very deconditioned- reports abdominal discomfort at operative site  Assessment/Plan: Active Problems:   Colonic obstruction due to suspected colitis; s/p colectomy/colostomy -per surgery -issues with wound dehiscence post op so requiring abd binder     Anasarca -due to malnutrition and critical illness    Acute respiratory failure with hypoxia/Tracheostomy status -etiology unclear but differential included ARDS, HCAP -currently has pulmonary edema likely from 3rd spacing in setting of hypoalbunemia -stable on TC -PCCM aware of need to downsize trach to proceed with PMV training and swallowing eval (MBSS)    Dehydration with hypernatremia -Increase free water per tube to 350 ml QID    Protein-calorie malnutrition, severe w/ electrolyte imbalance -cont tube feeding per NGT -follow up on pending MBS- +/- will  need to consider PEG -prn replete electrolytes    HTN (hypertension) -controlled off meds    DVT of axillary vein, acute right -Heparin on hold due to recent issues with  GIB sx's   Very small cerebral bleed -tiny SAH vs SDH seen on CT head from 7/23 - completed due to altered mentation    Anemia -baseline Hgb 11 -currently stable at 8.8    CAD (coronary artery disease) -? Restart Plavix    Depression    Barrett's esophagus    Physical deconditioning -? CIR-insurance payor to re evaluate Monday am (8/3)   DVT prophylaxis: SCDs Code Status: DO NOT RESUSCITATE Family Communication: Brother at bedside Disposition Plan/Expected LOS: Step down   Consultants: Surgery PCCM  Cultures: Blood x 2, 7/15 >> negative  Respiratory 7/15 >> few Candida  Repeat Blood x2, 7/23 >>>  BAL 7/24>>>normal flora  Antibiotics: Ceftriaxone 7/10 >>> 7/14  Flagyl 7/10 >>> 7/14  Vancomycin 7/14 >>> 7/16  Fluconazole 7/10 >>> 7/16  Zosyn 7/14 >> 7/21  Vancomycin 7/23 >>>off  Primaxin 7/23 >>>7/29  Micafungin 7/23 >>>7/24   Objective: Blood pressure 110/53, pulse 104, temperature 97.6 F (36.4 C), temperature source Oral, resp. rate 22, height 5' (1.524 m), weight 160 lb 4.4 oz (72.7 kg), SpO2 100.00%.  Intake/Output Summary (Last 24 hours) at 08/19/13 1435 Last data filed at 08/19/13 1100  Gross per 24 hour  Intake   1600 ml  Output   1875 ml  Net   -275 ml     Exam: Gen: No acute respiratory distress-very lethargic and weak appearing Chest: Coarse to auscultation bilaterally without any definitive rhonchi or wheezes, trach collar Cardiac: Regular rate and rhythm, S1-S2, no rubs murmurs or gallops, no peripheral edema, no JVD Abdomen: Soft tender diffusely over surgical incision nondistended without obvious hepatosplenomegaly, no ascites, colostomy with yellow brown liquid stool to drainage bag, NG tube patent with tube feeding infusing Extremities: Symmetrical in appearance without cyanosis, clubbing or effusion  Scheduled Meds:  Scheduled Meds: . antiseptic oral rinse  15 mL Mouth Rinse QID  . chlorhexidine  15 mL Mouth Rinse BID  . feeding  supplement (VITAL HIGH PROTEIN)  1,000 mL Per Tube Q24H  . free water  350 mL Per Tube 4 times per day  . insulin aspart  0-20 Units Subcutaneous 6 times per day  . lip balm  1 application Topical BID  . pantoprazole sodium  40 mg Per Tube Q1200   Continuous Infusions:   Data Reviewed: Basic Metabolic Panel:  Recent Labs Lab 08/12/13 2333  08/13/13 1137  08/16/13 0405  08/16/13 2330 08/17/13 0500 08/17/13 1247 08/18/13 0500 08/19/13 0445  NA 146  < > 146  < > 146  < > 149* 150* 153* 149* 151*  K 2.4*  < > 2.2*  < > 2.8*  < > 3.2* 3.0* 4.0 3.5* 3.8  CL 101  < > 98  < > 105  < > 108 109 111 112 115*  CO2 32  < > 39*  < > 31  < > 31 30 32 29 28  GLUCOSE 150*  < > 111*  < > 160*  < > 127* 136* 156* 94 129*  BUN 20  < > 16  < > 24*  < > 28* 29* 30* 34* 35*  CREATININE 0.35*  < > 0.36*  < > 0.37*  < > 0.43* 0.41* 0.37* 0.35* 0.37*  CALCIUM 8.4  < > 8.0*  < > 7.9*  < >  7.9* 7.8* 8.1* 8.1* 8.4  MG 1.6  --  1.4*  --  1.7  --   --  2.1  --   --   --   PHOS 2.4  --  2.2*  --  3.0  --   --   --   --  2.3  --   < > = values in this interval not displayed. Liver Function Tests:  Recent Labs Lab 08/15/13 0432  AST 19  ALT 23  ALKPHOS 60  BILITOT 1.3*  PROT 4.2*  ALBUMIN 1.8*   No results found for this basename: LIPASE, AMYLASE,  in the last 168 hours No results found for this basename: AMMONIA,  in the last 168 hours CBC:  Recent Labs Lab 08/14/13 1642 08/15/13 0432 08/16/13 0405 08/17/13 0500 08/19/13 0445  WBC 15.9* 14.1* 11.8* 9.5 9.2  HGB 8.1* 7.9* 8.6* 8.5* 8.8*  HCT 25.0* 25.0* 26.7* 27.9* 28.4*  MCV 90.3 91.2 91.1 92.4 94.7  PLT 272 268 268 262 211   Cardiac Enzymes: No results found for this basename: CKTOTAL, CKMB, CKMBINDEX, TROPONINI,  in the last 168 hours BNP (last 3 results)  Recent Labs  08/03/13 0930  PROBNP 3782.0*   CBG:  Recent Labs Lab 08/18/13 2035 08/19/13 0007 08/19/13 0404 08/19/13 0833 08/19/13 1240  GLUCAP 99 180* 135* 120*  118*    Recent Results (from the past 240 hour(s))  CULTURE, BLOOD (ROUTINE X 2)     Status: None   Collection Time    08/11/13 12:15 PM      Result Value Ref Range Status   Specimen Description BLOOD LEFT HAND   Final   Special Requests BOTTLES DRAWN AEROBIC ONLY 3CC   Final   Culture  Setup Time     Final   Value: 08/11/2013 17:39     Performed at [facility]   Culture     Final   Value: NO GROWTH 5 DAYS     Performed at [facility]   Report Status 08/17/2013 FINAL   Final  CULTURE, BLOOD (ROUTINE X 2)     Status: None   Collection Time    08/11/13 12:30 PM      Result Value Ref Range Status   Specimen Description BLOOD LEFT HAND   Final   Special Requests BOTTLES DRAWN AEROBIC ONLY 2 CC   Final   Culture  Setup Time     Final   Value: 08/11/2013 19:04     Performed at [facility]   Culture     Final   Value: NO GROWTH 5 DAYS     Performed at [facility]   Report Status 08/17/2013 FINAL   Final  AFB CULTURE WITH SMEAR     Status: None   Collection Time    08/12/13  1:34 PM      Result Value Ref Range Status   Specimen Description BRONCHIAL ALVEOLAR LAVAGE   Final   Special Requests NONE   Final   Acid Fast Smear     Final   Value: NO ACID FAST BACILLI SEEN     Performed at [facility]   Culture     Final   Value: CULTURE WILL BE EXAMINED FOR 6 WEEKS BEFORE ISSUING A FINAL REPORT     Performed at [facility]   Report Status PENDING   Incomplete  CULTURE, BAL-QUANTITATIVE     Status: None   Collection Time  08/12/13  1:34 PM      Result Value Ref Range Status   Specimen Description BRONCHIAL ALVEOLAR LAVAGE   Final   Special Requests NONE   Final   Gram Stain     Final   Value: MODERATE WBC PRESENT,BOTH PMN AND MONONUCLEAR     NO SQUAMOUS EPITHELIAL CELLS SEEN     MODERATE GRAM POSITIVE COCCI IN PAIRS     FEW YEAST     Performed at [facility]     Final   Value: >=100,000  COLONIES/ML     Performed at [facility]   Culture     Final   Value: Non-Pathogenic Oropharyngeal-type Flora Isolated.     Performed at [facility]   Report Status 08/14/2013 FINAL   Final  FUNGUS CULTURE W SMEAR     Status: None   Collection Time    08/12/13  1:34 PM      Result Value Ref Range Status   Specimen Description BRONCHIAL ALVEOLAR LAVAGE   Final   Special Requests NONE   Final   Fungal Smear     Final   Value: RARE YEAST     Performed at [facility]   Culture     Final   Value: CANDIDA ALBICANS     Performed at [facility]   Report Status PENDING   Incomplete     Studies:  Recent x-ray studies have been reviewed in detail by the Attending Physician  Time spent :      Erin Hearing, [employer] [facility] Office  [phone] Pager [phone]   **If unable to reach the above provider after paging please contact the [place] @ [phone]  On-Call/Text Page:      [email or web address]      password [email or web address]  If 7PM-7AM, please contact night-coverage [email or web address] Password [email or web address] 08/19/2013, 2:35 PM   LOS: 21 days   Examined patient with ANP Ebony Hail, discussed assessment and plan and agree with the above plan  patient with multiple complex medical issues> 40 min on direct patient care

## 2013-08-19 NOTE — Progress Notes (Signed)
SLP Cancellation Note  Patient Details Name: [name] MRN: [id number] DOB: 1940/03/27   Cancelled treatment:       Reason Eval/Treat Not Completed: Patient not medically ready. Pt cannot tolerate PMSV or proceed with swallow eval until trach is downsized at least to 6 cuffless, if not 4 cuffless. Discussed with Dr. Sherral Hammers who is determining with PCCM if pt is a candidate for downsize. Will follow via chart today for readiness.  Herbie Baltimore, [place] CCC-SLP [phone]  Lynann Beaver 08/19/2013, 9:00 AM

## 2013-08-19 NOTE — Progress Notes (Signed)
[facility] Progress Note  18 Days Post-Op  Subjective: Pt much more alert.  C/o nausea and abdominal pain, but seems to be tolerating tube feeds well.  Good colostomy output.  Follows commands.  Objective: Vital signs in last 24 hours: Temp:  [97.4 F (36.3 C)-98.3 F (36.8 C)] 97.4 F (36.3 C) (07/31 0830) Pulse Rate:  [92-110] 95 (07/31 1100) Resp:  [9-28] 22 (07/31 1100) BP: (95-123)/(37-57) 116/49 mmHg (07/31 1100) SpO2:  [99 %-100 %] 100 % (07/31 1100) FiO2 (%):  [28 %] 28 % (07/31 0830) Last BM Date: 08/18/13  Intake/Output from previous day: 07/30 0701 - 07/31 0700 In: 1150 [NG/GT:1150] Out: 2225 [Urine:2000; Stool:225] Intake/Output this shift: Total I/O In: 450 [NG/GT:450] Out: -   PE: Gen:  Alert, NAD, pleasant Card:  RRR, no M/G/R heard Pulm:  CTA, no W/R/R Abd: Soft, tender to palpation over midline wound, +BS, no HSM, midline wound with some granulation tissue, but some light brown/tan slough with evident wound dehiscence Ext:  No erythema, edema, or tenderness   Lab Results:   Recent Labs  08/17/13 0500 08/19/13 0445  WBC 9.5 9.2  HGB 8.5* 8.8*  HCT 27.9* 28.4*  PLT 262 211   BMET  Recent Labs  08/18/13 0500 08/19/13 0445  NA 149* 151*  K 3.5* 3.8  CL 112 115*  CO2 29 28  GLUCOSE 94 129*  BUN 34* 35*  CREATININE 0.35* 0.37*  CALCIUM 8.1* 8.4   PT/INR No results found for this basename: LABPROT, INR,  in the last 72 hours CMP     Component Value Date/Time   NA 151* 08/19/2013 0445   K 3.8 08/19/2013 0445   CL 115* 08/19/2013 0445   CO2 28 08/19/2013 0445   GLUCOSE 129* 08/19/2013 0445   BUN 35* 08/19/2013 0445   CREATININE 0.37* 08/19/2013 0445   CALCIUM 8.4 08/19/2013 0445   PROT 4.2* 08/15/2013 0432   ALBUMIN 1.8* 08/15/2013 0432   AST 19 08/15/2013 0432   ALT 23 08/15/2013 0432   ALKPHOS 60 08/15/2013 0432   BILITOT 1.3* 08/15/2013 0432   GFRNONAA >90 08/19/2013 0445   GFRAA >90 08/19/2013 0445   Lipase     Component  Value Date/Time   LIPASE 9* 07/29/2013 1605       Studies/Results: Dg Abd Portable 1v  08/17/2013   CLINICAL DATA:  Left lower quadrant pain  EXAM: PORTABLE ABDOMEN - 1 VIEW  COMPARISON:  08/11/2013  FINDINGS: There is NG tube in place with tip in distal stomach. Nonspecific nonobstructive bowel gas pattern.  IMPRESSION: NG tube in place.  Nonspecific nonobstructive bowel gas pattern.   Electronically Signed   By: Lahoma Crocker M.D.   On: 08/17/2013 15:36    Anti-infectives: Anti-infectives   Start     Dose/Rate Route Frequency Ordered Stop   08/13/13 1600  vancomycin (VANCOCIN) IVPB 1000 mg/200 mL premix  Status:  Discontinued     1,000 mg 200 mL/hr over 60 Minutes Intravenous Every 12 hours 08/13/13 1435 08/16/13 1243   08/11/13 1400  vancomycin (VANCOCIN) IVPB 750 mg/150 ml premix  Status:  Discontinued     750 mg 150 mL/hr over 60 Minutes Intravenous Every 12 hours 08/11/13 1138 08/13/13 1435   08/11/13 1300  imipenem-cilastatin (PRIMAXIN) 500 mg in sodium chloride 0.9 % 100 mL IVPB  Status:  Discontinued     500 mg 200 mL/hr over 30 Minutes Intravenous 3 times per day 08/11/13 1138 08/17/13 1055   08/11/13 1230  micafungin (MYCAMINE) 100 mg in sodium chloride 0.9 % 100 mL IVPB  Status:  Discontinued     100 mg 100 mL/hr over 1 Hours Intravenous Daily 08/11/13 1138 08/12/13 1132   08/07/13 1400  piperacillin-tazobactam (ZOSYN) IVPB 3.375 g     3.375 g 12.5 mL/hr over 240 Minutes Intravenous 3 times per day 08/07/13 0921 08/10/13 0101   08/03/13 1800  vancomycin (VANCOCIN) IVPB 750 mg/150 ml premix  Status:  Discontinued     750 mg 150 mL/hr over 60 Minutes Intravenous Every 12 hours 08/03/13 0440 08/04/13 0910   08/03/13 0500  vancomycin (VANCOCIN) 1,250 mg in sodium chloride 0.9 % 250 mL IVPB     1,250 mg 166.7 mL/hr over 90 Minutes Intravenous  Once 08/03/13 0440 08/03/13 0646   08/03/13 0500  piperacillin-tazobactam (ZOSYN) IVPB 3.375 g  Status:  Discontinued     3.375 g 12.5  mL/hr over 240 Minutes Intravenous 3 times per day 08/03/13 0440 08/07/13 0921   08/01/13 1245  metroNIDAZOLE (FLAGYL) IVPB 500 mg     500 mg 100 mL/hr over 60 Minutes Intravenous To Surgery 08/01/13 1238 08/01/13 1300   07/31/13 0800  cefoTEtan (CEFOTAN) 2 g in dextrose 5 % 50 mL IVPB     2 g 100 mL/hr over 30 Minutes Intravenous On call to O.R. 07/31/13 0741 08/01/13 0559   07/31/13 0800  cefTRIAXone (ROCEPHIN) 2 g in dextrose 5 % 50 mL IVPB  Status:  Discontinued    Comments:  Pharmacy may adjust dosing strength / duration / interval for maximal efficacy   2 g 100 mL/hr over 30 Minutes Intravenous Every 24 hours 07/31/13 0744 08/03/13 0405   07/29/13 2100  ciprofloxacin (CIPRO) IVPB 400 mg  Status:  Discontinued     400 mg 200 mL/hr over 60 Minutes Intravenous Every 12 hours 07/29/13 2058 07/31/13 0744   07/29/13 2100  metroNIDAZOLE (FLAGYL) IVPB 500 mg  Status:  Discontinued     500 mg 100 mL/hr over 60 Minutes Intravenous Every 8 hours 07/29/13 2058 08/03/13 0405   07/29/13 2100  fluconazole (DIFLUCAN) IVPB 200 mg  Status:  Discontinued     200 mg 100 mL/hr over 60 Minutes Intravenous Every 24 hours 07/29/13 2058 08/04/13 0911   07/29/13 1930  ciprofloxacin (CIPRO) IVPB 400 mg     400 mg 200 mL/hr over 60 Minutes Intravenous  Once 07/29/13 1915 07/29/13 2031   07/29/13 1930  metroNIDAZOLE (FLAGYL) IVPB 500 mg  Status:  Discontinued     500 mg 100 mL/hr over 60 Minutes Intravenous  Once 07/29/13 1915 07/29/13 2107       Assessment/Plan Colon obstruction POD #18 s/p Ex lap, partial colectomy, end colostomy with Hartmanns procedure, SBR, repair of multiple enterotomies, and LOA Acute respiratory failure post op s/p trach on 7/25 Post op wound dehiscence  PCM Inability to take PO  Plan: 1.  Seems more alert, continue with current medical treatment.  Appreciate CCM and medicine's care of this patient 2.  Continue tube feeds 3.  Antiemetics, pain meds prn, NG tube for tube  feeding 4.  Keep abdominal binder in place to prevent further wound dehiscence.  The abdominal binder is okay to be over top the ostomy, but the ostomy needs to be emptied regularly to prevent accidental leakage due to a full bag. 5.  Continue WD dressing changes to midline wound BID    LOS: 21 days    DORT, Ayonna Speranza 08/19/2013, 11:31 AM Pager: [phone]

## 2013-08-19 NOTE — Progress Notes (Signed)
Occupational Therapy Treatment Patient Details Name: [name] MRN: [id number] DOB: Aug 29, 1940 Today's Date: 08/19/2013    History of present illness Pt admit with colonic obstruction.  SB resection with colostomy.  VDRF and trach.  08/16/13 abdominal wound beginning to dehice.  Abdominal binder in place   OT comments  Pt appeared fatiqued but agreeable to work with OT.  Attempted all activities:  Proximal support given for grooming.  Follow Up Recommendations  CIR    Equipment Recommendations  None recommended by OT    Recommendations for Other Services      Precautions / Restrictions Precautions Precautions: Fall Precaution Comments: trach collar Restrictions Weight Bearing Restrictions: No       Mobility Bed Mobility         Supine to sit: Max assist     General bed mobility comments: used pad to assist.  assisted with LEs and trunk.  Pt used arm on rail to self-assist  Transfers     Transfers: Squat Pivot Transfers Sit to Stand: Max assist;+2 physical assistance         General transfer comment: squat pivot to avoid buckling knee, second person for multiple lines    Balance   Sitting-balance support: Feet unsupported;Bilateral upper extremity supported Sitting balance-Leahy Scale: Poor (min guard for safety)                             ADL       Grooming: Wash/dry face;Brushing hair;Moderate assistance;Maximal assistance (proximal support given for RUE/LUE:  max for hair)                   Toilet Transfer: Maximal assistance;+2 for safety/equipment;+2 for physical assistance;Squat-pivot             General ADL Comments: performed aarom from supine; sat eob x 2 minutes then squat pivot transfer to recliner:  A x 2 to reposition back in chair. Pt fatiiqued but attempted all activities asked.      Vision                     Perception     Praxis      Cognition   Behavior During Therapy: Flat  affect Overall Cognitive Status: Impaired/Different from baseline     Current Attention Level: Sustained    Following Commands: Follows one step commands with increased time       General Comments: cognition difficult to accurately assess due to tracheostomy    Extremity/Trunk Assessment               Exercises General Exercises - Upper Extremity Shoulder Flexion: AAROM;10 reps;Right (shoulder flexion; abduction; AROM elbow flexion bil.  AROM L shoulder flexion and abduction)   Shoulder Instructions       General Comments      Pertinent Vitals/ Pain       Pt indicated pain on L side; RN called for pain medication  Home Living                                          Prior Functioning/Environment              Frequency Min 3X/week     Progress Toward Goals  OT Goals(current goals can now be found in the care plan section)  Progress towards  OT goals: Progressing toward goals     Plan      Co-evaluation                 End of Session     Activity Tolerance Patient tolerated treatment well   Patient Left in chair;with family/visitor present;with call bell/phone within reach   Nurse Communication  (NT present for transfer:  recommended maxi move)        Time:  -     Charges: OT General Charges $OT Visit: 1 Procedure OT Treatments $Therapeutic Activity: 23-37 mins  Efraim Vanallen 08/19/2013, 3:50 PM Lesle Chris, OTR/L [phone] 08/19/2013

## 2013-08-20 DIAGNOSIS — Z9889 Other specified postprocedural states: Secondary | ICD-10-CM

## 2013-08-20 DIAGNOSIS — Z933 Colostomy status: Secondary | ICD-10-CM

## 2013-08-20 DIAGNOSIS — E43 Unspecified severe protein-calorie malnutrition: Secondary | ICD-10-CM

## 2013-08-20 LAB — COMPREHENSIVE METABOLIC PANEL WITH GFR
ALT: 40 U/L — ABNORMAL HIGH (ref 0–35)
AST: 18 U/L (ref 0–37)
Albumin: 2.1 g/dL — ABNORMAL LOW (ref 3.5–5.2)
Alkaline Phosphatase: 81 U/L (ref 39–117)
Anion gap: 6 (ref 5–15)
BUN: 32 mg/dL — ABNORMAL HIGH (ref 6–23)
CO2: 28 meq/L (ref 19–32)
Calcium: 8.2 mg/dL — ABNORMAL LOW (ref 8.4–10.5)
Chloride: 111 meq/L (ref 96–112)
Creatinine, Ser: 0.33 mg/dL — ABNORMAL LOW (ref 0.50–1.10)
GFR calc Af Amer: >90 mL/min
GFR calc non Af Amer: >90 mL/min
Glucose, Bld: 131 mg/dL — ABNORMAL HIGH (ref 70–99)
Potassium: 3.6 meq/L — ABNORMAL LOW (ref 3.7–5.3)
Sodium: 145 meq/L (ref 137–147)
Total Bilirubin: 0.5 mg/dL (ref 0.3–1.2)
Total Protein: 4.8 g/dL — ABNORMAL LOW (ref 6.0–8.3)

## 2013-08-20 LAB — GLUCOSE, CAPILLARY
Glucose-Capillary: 119 mg/dL — ABNORMAL HIGH (ref 70–99)
Glucose-Capillary: 125 mg/dL — ABNORMAL HIGH (ref 70–99)
Glucose-Capillary: 140 mg/dL — ABNORMAL HIGH (ref 70–99)
Glucose-Capillary: 145 mg/dL — ABNORMAL HIGH (ref 70–99)
Glucose-Capillary: 79 mg/dL (ref 70–99)
Glucose-Capillary: 92 mg/dL (ref 70–99)

## 2013-08-20 MED ORDER — POTASSIUM CHLORIDE 10 MEQ/100ML IV SOLN
10.0000 meq | INTRAVENOUS | Status: AC
  Administered 2013-08-20 (×2): 10 meq via INTRAVENOUS
  Filled 2013-08-20 (×2): qty 100

## 2013-08-20 NOTE — Progress Notes (Signed)
19 Days Post-Op  Subjective: Awake and alert, comfortable Tolerating tube feeds  Objective: Vital signs in last 24 hours: Temp:  [97 F (36.1 C)-97.9 F (36.6 C)] 97.6 F (36.4 C) (08/01 0800) Pulse Rate:  [85-110] 85 (08/01 0416) Resp:  [18-30] 20 (08/01 0416) BP: (104-119)/(43-62) 118/47 mmHg (08/01 0416) SpO2:  [100 %] 100 % (08/01 0416) FiO2 (%):  [28 %] 28 % (08/01 0416) Last BM Date: 08/18/13  Intake/Output from previous day: 07/31 0701 - 08/01 0700 In: 1500 [NG/GT:1500] Out: 1200 [Urine:400; Stool:800] Intake/Output this shift:    Trach in place Lungs clearer Abdomen soft, wound stable, ostomy working well  Lab Results:   Recent Labs  08/19/13 0445  WBC 9.2  HGB 8.8*  HCT 28.4*  PLT 211   BMET  Recent Labs  08/19/13 0445 08/20/13 0501  NA 151* 145  K 3.8 3.6*  CL 115* 111  CO2 28 28  GLUCOSE 129* 131*  BUN 35* 32*  CREATININE 0.37* 0.33*  CALCIUM 8.4 8.2*   PT/INR No results found for this basename: LABPROT, INR,  in the last 72 hours ABG No results found for this basename: PHART, PCO2, PO2, HCO3,  in the last 72 hours  Studies/Results: No results found.  Anti-infectives: Anti-infectives   Start     Dose/Rate Route Frequency Ordered Stop   08/13/13 1600  vancomycin (VANCOCIN) IVPB 1000 mg/200 mL premix  Status:  Discontinued     1,000 mg 200 mL/hr over 60 Minutes Intravenous Every 12 hours 08/13/13 1435 08/16/13 1243   08/11/13 1400  vancomycin (VANCOCIN) IVPB 750 mg/150 ml premix  Status:  Discontinued     750 mg 150 mL/hr over 60 Minutes Intravenous Every 12 hours 08/11/13 1138 08/13/13 1435   08/11/13 1300  imipenem-cilastatin (PRIMAXIN) 500 mg in sodium chloride 0.9 % 100 mL IVPB  Status:  Discontinued     500 mg 200 mL/hr over 30 Minutes Intravenous 3 times per day 08/11/13 1138 08/17/13 1055   08/11/13 1230  micafungin (MYCAMINE) 100 mg in sodium chloride 0.9 % 100 mL IVPB  Status:  Discontinued     100 mg 100 mL/hr over 1 Hours  Intravenous Daily 08/11/13 1138 08/12/13 1132   08/07/13 1400  piperacillin-tazobactam (ZOSYN) IVPB 3.375 g     3.375 g 12.5 mL/hr over 240 Minutes Intravenous 3 times per day 08/07/13 0921 08/10/13 0101   08/03/13 1800  vancomycin (VANCOCIN) IVPB 750 mg/150 ml premix  Status:  Discontinued     750 mg 150 mL/hr over 60 Minutes Intravenous Every 12 hours 08/03/13 0440 08/04/13 0910   08/03/13 0500  vancomycin (VANCOCIN) 1,250 mg in sodium chloride 0.9 % 250 mL IVPB     1,250 mg 166.7 mL/hr over 90 Minutes Intravenous  Once 08/03/13 0440 08/03/13 0646   08/03/13 0500  piperacillin-tazobactam (ZOSYN) IVPB 3.375 g  Status:  Discontinued     3.375 g 12.5 mL/hr over 240 Minutes Intravenous 3 times per day 08/03/13 0440 08/07/13 0921   08/01/13 1245  metroNIDAZOLE (FLAGYL) IVPB 500 mg     500 mg 100 mL/hr over 60 Minutes Intravenous To Surgery 08/01/13 1238 08/01/13 1300   07/31/13 0800  cefoTEtan (CEFOTAN) 2 g in dextrose 5 % 50 mL IVPB     2 g 100 mL/hr over 30 Minutes Intravenous On call to O.R. 07/31/13 0741 08/01/13 0559   07/31/13 0800  cefTRIAXone (ROCEPHIN) 2 g in dextrose 5 % 50 mL IVPB  Status:  Discontinued  Comments:  Pharmacy may adjust dosing strength / duration / interval for maximal efficacy   2 g 100 mL/hr over 30 Minutes Intravenous Every 24 hours 07/31/13 0744 08/03/13 0405   07/29/13 2100  ciprofloxacin (CIPRO) IVPB 400 mg  Status:  Discontinued     400 mg 200 mL/hr over 60 Minutes Intravenous Every 12 hours 07/29/13 2058 07/31/13 0744   07/29/13 2100  metroNIDAZOLE (FLAGYL) IVPB 500 mg  Status:  Discontinued     500 mg 100 mL/hr over 60 Minutes Intravenous Every 8 hours 07/29/13 2058 08/03/13 0405   07/29/13 2100  fluconazole (DIFLUCAN) IVPB 200 mg  Status:  Discontinued     200 mg 100 mL/hr over 60 Minutes Intravenous Every 24 hours 07/29/13 2058 08/04/13 0911   07/29/13 1930  ciprofloxacin (CIPRO) IVPB 400 mg     400 mg 200 mL/hr over 60 Minutes Intravenous  Once  07/29/13 1915 07/29/13 2031   07/29/13 1930  metroNIDAZOLE (FLAGYL) IVPB 500 mg  Status:  Discontinued     500 mg 100 mL/hr over 60 Minutes Intravenous  Once 07/29/13 1915 07/29/13 2107      Assessment/Plan: s/p Procedure(s): EXPLORATORY LAPAROTOMY (N/A) PARTIAL COLECTOMY (N/A) COLOSTOMY (N/A) SMALL BOWEL RESECTION (N/A)  Continue wound care and tube feeds. Trach care PT  LOS: 22 days    [name] A 08/20/2013

## 2013-08-20 NOTE — Progress Notes (Signed)
[facility] TEAM 1 - Stepdown/ICU TEAM Progress Note  CINDRA AUSTAD UUV:[id number] DOB: 09-16-1940 DOA: 07/29/2013 PCP: Laverna Peace, NP  Admit HPI / Brief Narrative: 73 y.o. WF PMHx Depression, HTN, HLD, coronary artery disease, chronic pain syndrome. Present with 2 week history of increased abdominal pain with constipation and nausea but no vomiting. Her history started on April 10 when she had lysis of adhesions and ovarian cystectomy for small bowel obstruction. Her family reported that she had a complication of anesthesia during the surgery. She returned to the hospital few days later with abdominal pain that was diagnosed as colitis and she was discharged home on Levaquin and Flagyl. Her pain improved slightly but she started having abdominal pain again and was admitted to [facility] and was continued on antibiotics with no need for surgical intervention. Gastroenterology followed the patient and did not recommend aggressive intervention so she was discharged home. She started having worsening of her abdominal pain over the previous 2 weeks associated with nausea but no vomiting. She reported severe constipation and had not had any bowel movement for 1 week. Patient had a colonoscopy one week prior to presentation but they could not cross beyond the splenic flexure according to the husband.  She presented to the ER on 710 because of the above symptoms. CT of the abdomen was done and showed obstruction at the level of the colon with small bowel distention. Surgery was consulted. She went to the OR for repair 7/13 and remained on the vent post-op.   HPI/Subjective: 8/1 currently has tracheostomy indicates negative pain, but continued nausea.  Assessment/Plan:  Colonic obstruction due to suspected colitis; s/p colectomy/colostomy  -per surgery  -issues with wound dehiscence post op so requiring abd binder (in place properly)   Anasarca  -due to malnutrition and critical illness    Acute respiratory failure with hypoxia/Tracheostomy status  -etiology unclear but differential included ARDS, HCAP  -currently has pulmonary edema likely from 3rd spacing in setting of hypoalbunemia  -stable on TC  -PCCM aware of need to downsize trach. Counseled her husband that they will reassess downsizing on Monday   Dehydration with hypernatremia  -Improved with increased free water continue at  water per tube to 350 ml QID   Protein-calorie malnutrition, severe w/ electrolyte imbalance  -cont tube feeding per NGT  -prn replete electrolytes    Hypokalemia -Potassium 10 mEq x2 -Recheck potassium and magnesium in a.m.  HTN (hypertension)  -controlled off meds ; has not required when necessary metoprolol  DVT of axillary vein, acute right  -Heparin on hold due to recent issues with GIB sx's   Very small cerebral bleed  -tiny SAH vs SDH seen on CT head from 7/23 - completed due to altered mentation   Anemia  -baseline Hgb 11  -currently stable at 8.2  CAD (coronary artery disease)  -Hold for now  Depression   Barrett's esophagus   Physical deconditioning  -? CIR-insurance payor to re evaluate Monday am (8/3)   Code Status: DNR Family Communication: Husband present  Disposition Plan: Per surgery    Consultants: Dr. Simonne Maffucci (PCCM.) Dr. Judeth Horn (surgery)   Procedure/Significant Events: 7/10 Admit, CT Abd - dilated prox colon w/ abrupt transition c/w with obstruction, diverticulitis vs colitis, Surgery c/s  7/12 Empiric Cipro/Flagyl, Cardiology consulted for pre-op evaluation, TPN per pharm  7/13 - Laparotomy, partial colectomy, colostomy, SB resection, repair of enterotomy, lysis of adhesions  - Hgb down to 6.8 >> imp to 10.8 s/p 2u  PRBC  7/14 - Extubated, required re-intubation due to hypoxemic resp failure, hypotension, concern for aspiration  7/15 - 2D echo>>> severely dilated LA, normal systolic function  6/62 - Considered ARDS (unclear  etiology, ?aspiration), CXR (persistent b/l edema), cont pressors, stable low PCT 1.9  7/17 - CT abd >no bleeding , no abscess, sm fluid in abd/pelvis  7/17 - Ven Dopp>+DVT Rt axillary vein  7/23 -CT Head (subcentimeter focal parenchymal hemorrhage - no SAH or subdural, no acute ischemic change)  - CT chest w/ contrast (severe multilobar pneumonia, moderate b/l pleural effusions, also favor chronicity  - CT Abd/Pelvis (IV, PO) (post-op changes, s/p L-hemicolectomy, transverse colon markedly thickened, unclear if related to acute inflammation/infection vs secondary distention/post-op edema)  7/24 - s/p bedside trach (DF) / bronchoscopy  7/25 - melena with dec Hgb  7/28 - tol ATC     Culture Blood x 2, 7/15 >> negative  Respiratory 7/15 >> few Candida  Repeat Blood x2, 7/23 >>>  BAL 7/24>>>normal flora    Antibiotics: Ceftriaxone 7/10 >>> 7/14  Flagyl 7/10 >>> 7/14  Vancomycin 7/14 >>> 7/16  Fluconazole 7/10 >>> 7/16  Zosyn 7/14 >> 7/21  Vancomycin 7/23 >>>off  Primaxin 7/23 >>>7/29  Micafungin 7/23 >>>7/24   DVT prophylaxis: SCD   Devices NA   LINES / TUBES:  7/14 triple-lumen CVC right IJ     Continuous Infusions:   Objective: VITAL SIGNS: Temp: 97.9 F (36.6 C) (08/01 1630) Temp src: Oral (08/01 1630) BP: 103/50 mmHg (08/01 1630) Pulse Rate: 107 (08/01 1630) SPO2; 100% on trach collar 5 L/minute;  FIO2: 28%   Intake/Output Summary (Last 24 hours) at 08/20/13 1711 Last data filed at 08/20/13 1600  Gross per 24 hour  Intake   1850 ml  Output   2100 ml  Net   -250 ml     Exam: Gen: A./O., follows all commands, indicates has continued nausea, No acute respiratory distress  Chest: Coarse to auscultation bilaterally without any definitive rhonchi or wheezes, trach collar  Cardiac: Regular rate and rhythm, S1-S2, no rubs murmurs or gallops, no peripheral edema, no JVD  Abdomen: Soft tender diffusely over surgical incision nondistended without obvious  hepatosplenomegaly, abdomen shows partial dehiscence with wet to dry within& surgical incision site, negative sign of infection no ascites, colostomy with yellow brown liquid stool in drainage bag, NG tube patent with tube feeding infusing  Extremities: Symmetrical in appearance without cyanosis, clubbing or effusion  Data Reviewed: Basic Metabolic Panel:  Recent Labs Lab 08/16/13 0405  08/17/13 0500 08/17/13 1247 08/18/13 0500 08/19/13 0445 08/20/13 0501  NA 146  < > 150* 153* 149* 151* 145  K 2.8*  < > 3.0* 4.0 3.5* 3.8 3.6*  CL 105  < > 109 111 112 115* 111  CO2 31  < > 30 32 29 28 28   GLUCOSE 160*  < > 136* 156* 94 129* 131*  BUN 24*  < > 29* 30* 34* 35* 32*  CREATININE 0.37*  < > 0.41* 0.37* 0.35* 0.37* 0.33*  CALCIUM 7.9*  < > 7.8* 8.1* 8.1* 8.4 8.2*  MG 1.7  --  2.1  --   --   --   --   PHOS 3.0  --   --   --  2.3  --   --   < > = values in this interval not displayed. Liver Function Tests:  Recent Labs Lab 08/15/13 0432 08/20/13 0501  AST 19 18  ALT 23 40*  ALKPHOS 60 81  BILITOT 1.3* 0.5  PROT 4.2* 4.8*  ALBUMIN 1.8* 2.1*   No results found for this basename: LIPASE, AMYLASE,  in the last 168 hours No results found for this basename: AMMONIA,  in the last 168 hours CBC:  Recent Labs Lab 08/14/13 1642 08/15/13 0432 08/16/13 0405 08/17/13 0500 08/19/13 0445  WBC 15.9* 14.1* 11.8* 9.5 9.2  HGB 8.1* 7.9* 8.6* 8.5* 8.8*  HCT 25.0* 25.0* 26.7* 27.9* 28.4*  MCV 90.3 91.2 91.1 92.4 94.7  PLT 272 268 268 262 211   Cardiac Enzymes: No results found for this basename: CKTOTAL, CKMB, CKMBINDEX, TROPONINI,  in the last 168 hours BNP (last 3 results)  Recent Labs  08/03/13 0930  PROBNP 3782.0*   CBG:  Recent Labs Lab 08/19/13 2355 08/20/13 0345 08/20/13 0827 08/20/13 1132 08/20/13 1628  GLUCAP 140* 145* 79 119* 125*    Recent Results (from the past 240 hour(s))  CULTURE, BLOOD (ROUTINE X 2)     Status: None   Collection Time    08/11/13 12:15  PM      Result Value Ref Range Status   Specimen Description BLOOD LEFT HAND   Final   Special Requests BOTTLES DRAWN AEROBIC ONLY 3CC   Final   Culture  Setup Time     Final   Value: 08/11/2013 17:39     Performed at [facility]   Culture     Final   Value: NO GROWTH 5 DAYS     Performed at [facility]   Report Status 08/17/2013 FINAL   Final  CULTURE, BLOOD (ROUTINE X 2)     Status: None   Collection Time    08/11/13 12:30 PM      Result Value Ref Range Status   Specimen Description BLOOD LEFT HAND   Final   Special Requests BOTTLES DRAWN AEROBIC ONLY 2 CC   Final   Culture  Setup Time     Final   Value: 08/11/2013 19:04     Performed at [facility]   Culture     Final   Value: NO GROWTH 5 DAYS     Performed at [facility]   Report Status 08/17/2013 FINAL   Final  AFB CULTURE WITH SMEAR     Status: None   Collection Time    08/12/13  1:34 PM      Result Value Ref Range Status   Specimen Description BRONCHIAL ALVEOLAR LAVAGE   Final   Special Requests NONE   Final   Acid Fast Smear     Final   Value: NO ACID FAST BACILLI SEEN     Performed at [facility]   Culture     Final   Value: CULTURE WILL BE EXAMINED FOR 6 WEEKS BEFORE ISSUING A FINAL REPORT     Performed at [facility]   Report Status PENDING   Incomplete  CULTURE, BAL-QUANTITATIVE     Status: None   Collection Time    08/12/13  1:34 PM      Result Value Ref Range Status   Specimen Description BRONCHIAL ALVEOLAR LAVAGE   Final   Special Requests NONE   Final   Gram Stain     Final   Value: MODERATE WBC PRESENT,BOTH PMN AND MONONUCLEAR     NO SQUAMOUS EPITHELIAL CELLS SEEN     MODERATE GRAM POSITIVE COCCI IN PAIRS     FEW YEAST     Performed  at [facility]     Final   Value: >=100,000 COLONIES/ML     Performed at [facility]   Culture     Final   Value: Non-Pathogenic Oropharyngeal-type Flora Isolated.     Performed at  [facility]   Report Status 08/14/2013 FINAL   Final  FUNGUS CULTURE W SMEAR     Status: None   Collection Time    08/12/13  1:34 PM      Result Value Ref Range Status   Specimen Description BRONCHIAL ALVEOLAR LAVAGE   Final   Special Requests NONE   Final   Fungal Smear     Final   Value: RARE YEAST     Performed at [facility]   Culture     Final   Value: CANDIDA ALBICANS     Performed at [facility]   Report Status PENDING   Incomplete     Studies:  Recent x-ray studies have been reviewed in detail by the Attending Physician  Scheduled Meds:  Scheduled Meds: . antiseptic oral rinse  15 mL Mouth Rinse QID  . chlorhexidine  15 mL Mouth Rinse BID  . feeding supplement (VITAL HIGH PROTEIN)  1,000 mL Per Tube Q24H  . free water  350 mL Per Tube 4 times per day  . insulin aspart  0-20 Units Subcutaneous 6 times per day  . lip balm  1 application Topical BID  . pantoprazole sodium  40 mg Per Tube Q1200    Time spent on care of this patient: 40 mins   Allie Bossier , MD   [facility] Office  [phone] Pager - [phone]  On-Call/Text Page:      [email or web address]      password [email or web address]  If 7PM-7AM, please contact night-coverage [email or web address] Password [email or web address] 08/20/2013, 5:11 PM   LOS: 22 days

## 2013-08-21 ENCOUNTER — Inpatient Hospital Stay (HOSPITAL_COMMUNITY): Payer: Medicare HMO

## 2013-08-21 DIAGNOSIS — I82409 Acute embolism and thrombosis of unspecified deep veins of unspecified lower extremity: Secondary | ICD-10-CM

## 2013-08-21 LAB — COMPREHENSIVE METABOLIC PANEL
ALT: 40 U/L — AB (ref 0–35)
AST: 24 U/L (ref 0–37)
Albumin: 2 g/dL — ABNORMAL LOW (ref 3.5–5.2)
Alkaline Phosphatase: 82 U/L (ref 39–117)
Anion gap: 10 (ref 5–15)
BUN: 21 mg/dL (ref 6–23)
CALCIUM: 7.9 mg/dL — AB (ref 8.4–10.5)
CO2: 24 mEq/L (ref 19–32)
Chloride: 102 mEq/L (ref 96–112)
Creatinine, Ser: 0.32 mg/dL — ABNORMAL LOW (ref 0.50–1.10)
GFR calc non Af Amer: >90 mL/min (ref >90)
GLUCOSE: 118 mg/dL — AB (ref 70–99)
Potassium: 3.6 mEq/L — ABNORMAL LOW (ref 3.7–5.3)
SODIUM: 136 meq/L — AB (ref 137–147)
TOTAL PROTEIN: 4.5 g/dL — AB (ref 6.0–8.3)
Total Bilirubin: 0.5 mg/dL (ref 0.3–1.2)

## 2013-08-21 LAB — MAGNESIUM: MAGNESIUM: 1.4 mg/dL — AB (ref 1.5–2.5)

## 2013-08-21 LAB — GLUCOSE, CAPILLARY
GLUCOSE-CAPILLARY: 116 mg/dL — AB (ref 70–99)
GLUCOSE-CAPILLARY: 114 mg/dL — AB (ref 70–99)
Glucose-Capillary: 106 mg/dL — ABNORMAL HIGH (ref 70–99)
Glucose-Capillary: 108 mg/dL — ABNORMAL HIGH (ref 70–99)
Glucose-Capillary: 116 mg/dL — ABNORMAL HIGH (ref 70–99)

## 2013-08-21 MED ORDER — FUROSEMIDE 10 MG/ML IJ SOLN
40.0000 mg | Freq: Once | INTRAMUSCULAR | Status: AC
  Administered 2013-08-21: 40 mg via INTRAVENOUS
  Filled 2013-08-21: qty 4

## 2013-08-21 NOTE — Progress Notes (Deleted)
Pt asymptomatic and without complaint. QTc prolongation noted on rhythm strips. EKG done. QTc prolongation and flipped T waves noted. MD Sherral Hammers made aware. New orders to follow. Will continue to monitor.

## 2013-08-21 NOTE — Progress Notes (Signed)
[place] TEAM 1 - Stepdown/ICU TEAM Progress Note  MERY GUADALUPE VPX:[id number] DOB: Sep 22, 1940 DOA: 07/29/2013 PCP: Laverna Peace, NP  Admit HPI / Brief Narrative: 73 y.o. WF PMHx Depression, HTN, HLD, coronary artery disease, chronic pain syndrome. Present with 2 week history of increased abdominal pain with constipation and nausea but no vomiting. Her history started on April 10 when she had lysis of adhesions and ovarian cystectomy for small bowel obstruction. Her family reported that she had a complication of anesthesia during the surgery. She returned to the hospital few days later with abdominal pain that was diagnosed as colitis and she was discharged home on Levaquin and Flagyl. Her pain improved slightly but she started having abdominal pain again and was admitted to [facility] and was continued on antibiotics with no need for surgical intervention. Gastroenterology followed the patient and did not recommend aggressive intervention so she was discharged home. She started having worsening of her abdominal pain over the previous 2 weeks associated with nausea but no vomiting. She reported severe constipation and had not had any bowel movement for 1 week. Patient had a colonoscopy one week prior to presentation but they could not cross beyond the splenic flexure according to the husband.  She presented to the ER on 710 because of the above symptoms. CT of the abdomen was done and showed obstruction at the level of the colon with small bowel distention. Surgery was consulted. She went to the OR for repair 7/13 and remained on the vent post-op.   HPI/Subjective: 8/2 currently has tracheostomy indicates negative pain, nausea has resolved but feels SOB.  Assessment/Plan:  Colonic obstruction due to suspected colitis; s/p colectomy/colostomy  -per surgery  -issues with wound dehiscence post op so requiring abd binder (in place properly)   Anasarca  -due to malnutrition and critical  illness   Acute respiratory failure with hypoxia/Tracheostomy status  -etiology unclear but differential included ARDS, HCAP  -currently has pulmonary edema likely from 3rd spacing in setting of hypoalbunemia  -stable on TC  -PCCM aware of need to downsize trach. Counseled her husband that they will reassess downsizing on Monday  - 8/2 Pt feels SOB (SPO2=100%): PCXR when compared to previous PCXR overall improved however still small bilateral pleural effusion. Lasix IV 40 mg x1  Dehydration with hypernatremia  -Improved with increased free water continue water per tube to 350 ml QID   Protein-calorie malnutrition, severe w/ electrolyte imbalance  -cont tube feeding per NGT  -prn replete electrolytes    Hypokalemia -Potassium 10 mEq x2 -Recheck potassium and magnesium in a.m.  HTN (hypertension)  -controlled off meds ; has not required when necessary metoprolol  DVT of axillary vein, acute right  -Heparin on hold due to recent issues with GIB sx's   Very small cerebral bleed  -tiny SAH vs SDH seen on CT head from 7/23 - completed due to altered mentation   Anemia  -baseline Hgb 11  -currently stable   CAD (coronary artery disease)  -Hold for now  Depression   Barrett's esophagus   Physical deconditioning  -? CIR-insurance payor to re evaluate Monday am (8/3)   Code Status: DNR Family Communication: Husband present  Disposition Plan: Per surgery    Consultants: Dr. Simonne Maffucci (PCCM.) Dr. Judeth Horn (surgery)   Procedure/Significant Events: 7/10 Admit, CT Abd - dilated prox colon w/ abrupt transition c/w with obstruction, diverticulitis vs colitis, Surgery c/s  7/12 Empiric Cipro/Flagyl, Cardiology consulted for pre-op evaluation, TPN per pharm  7/13 -  Laparotomy, partial colectomy, colostomy, SB resection, repair of enterotomy, lysis of adhesions  - Hgb down to 6.8 >> imp to 10.8 s/p 2u PRBC  7/14 - Extubated, required re-intubation due to hypoxemic  resp failure, hypotension, concern for aspiration  7/15 - 2D echo>>> severely dilated LA, normal systolic function  2/35 - Considered ARDS (unclear etiology, ?aspiration), CXR (persistent b/l edema), cont pressors, stable low PCT 1.9  7/17 - CT abd >no bleeding , no abscess, sm fluid in abd/pelvis  7/17 - Ven Dopp>+DVT Rt axillary vein  7/23 -CT Head (subcentimeter focal parenchymal hemorrhage - no SAH or subdural, no acute ischemic change)  - CT chest w/ contrast (severe multilobar pneumonia, moderate b/l pleural effusions, also favor chronicity  - CT Abd/Pelvis (IV, PO) (post-op changes, s/p L-hemicolectomy, transverse colon markedly thickened, unclear if related to acute inflammation/infection vs secondary distention/post-op edema)  7/24 - s/p bedside trach (DF) / bronchoscopy  7/25 - melena with dec Hgb  7/28 - tol ATC  8/2 PCXR;Cardiomegaly with mild interstitial edema.-Patchy bilateral lower lobe opacities, possibly atelectasis, left lower lobe not excluded.  -Small bilateral pleural effusions, Lt>Rt.  -Tracheostomy in satisfactory position.    Culture Blood x 2, 7/15 >> negative  Respiratory 7/15 >> few Candida  Repeat Blood x2, 7/23 >>>  BAL 7/24>>>normal flora    Antibiotics: Ceftriaxone 7/10 >>> 7/14  Flagyl 7/10 >>> 7/14  Vancomycin 7/14 >>> 7/16  Fluconazole 7/10 >>> 7/16  Zosyn 7/14 >> 7/21  Vancomycin 7/23 >>>off  Primaxin 7/23 >>>7/29  Micafungin 7/23 >>>7/24   DVT prophylaxis: SCD   Devices NA   LINES / TUBES:  7/14 triple-lumen CVC right IJ     Continuous Infusions:   Objective: VITAL SIGNS: Temp: 97.9 F (36.6 C) (08/02 0810) Temp src: Oral (08/02 0810) BP: 110/57 mmHg (08/02 0810) Pulse Rate: 84 (08/02 0810) SPO2; 100% on trach collar 5 L/minute;  FIO2: 28%   Intake/Output Summary (Last 24 hours) at 08/21/13 0930 Last data filed at 08/21/13 0406  Gross per 24 hour  Intake    900 ml  Output   1550 ml  Net   -650 ml      Exam: Gen: A./O x4., follows all commands, indicates nausea has resolved, No acute respiratory distress  Chest:  Diffuse Coarse BS to auscultation  without any definitive rhonchi or wheezes, trach collar  Cardiac: Regular rate and rhythm, S1-S2, no rubs murmurs or gallops, no peripheral edema, no JVD  Abdomen: Soft tender diffusely over surgical incision nondistended without obvious hepatosplenomegaly, abdomen shows partial dehiscence with wet to dry @; surgical incision site, negative sign of infection no ascites, colostomy with yellow brown liquid stool in drainage bag, NG tube patent with tube feeding infusing  Extremities: Symmetrical in appearance without cyanosis, clubbing or effusion  Data Reviewed: Basic Metabolic Panel:  Recent Labs Lab 08/16/13 0405  08/17/13 0500 08/17/13 1247 08/18/13 0500 08/19/13 0445 08/20/13 0501 08/21/13 0226  NA 146  < > 150* 153* 149* 151* 145 136*  K 2.8*  < > 3.0* 4.0 3.5* 3.8 3.6* 3.6*  CL 105  < > 109 111 112 115* 111 102  CO2 31  < > 30 32 29 28 28 24   GLUCOSE 160*  < > 136* 156* 94 129* 131* 118*  BUN 24*  < > 29* 30* 34* 35* 32* 21  CREATININE 0.37*  < > 0.41* 0.37* 0.35* 0.37* 0.33* 0.32*  CALCIUM 7.9*  < > 7.8* 8.1* 8.1* 8.4 8.2* 7.9*  MG 1.7  --  2.1  --   --   --   --  1.4*  PHOS 3.0  --   --   --  2.3  --   --   --   < > = values in this interval not displayed. Liver Function Tests:  Recent Labs Lab 08/15/13 0432 08/20/13 0501 08/21/13 0226  AST 19 18 24   ALT 23 40* 40*  ALKPHOS 60 81 82  BILITOT 1.3* 0.5 0.5  PROT 4.2* 4.8* 4.5*  ALBUMIN 1.8* 2.1* 2.0*   No results found for this basename: LIPASE, AMYLASE,  in the last 168 hours No results found for this basename: AMMONIA,  in the last 168 hours CBC:  Recent Labs Lab 08/14/13 1642 08/15/13 0432 08/16/13 0405 08/17/13 0500 08/19/13 0445  WBC 15.9* 14.1* 11.8* 9.5 9.2  HGB 8.1* 7.9* 8.6* 8.5* 8.8*  HCT 25.0* 25.0* 26.7* 27.9* 28.4*  MCV 90.3 91.2 91.1  92.4 94.7  PLT 272 268 268 262 211   Cardiac Enzymes: No results found for this basename: CKTOTAL, CKMB, CKMBINDEX, TROPONINI,  in the last 168 hours BNP (last 3 results)  Recent Labs  08/03/13 0930  PROBNP 3782.0*   CBG:  Recent Labs Lab 08/20/13 1628 08/20/13 2040 08/20/13 2340 08/21/13 0359 08/21/13 0817  GLUCAP 125* 92 106* 116* 108*    Recent Results (from the past 240 hour(s))  CULTURE, BLOOD (ROUTINE X 2)     Status: None   Collection Time    08/11/13 12:15 PM      Result Value Ref Range Status   Specimen Description BLOOD LEFT HAND   Final   Special Requests BOTTLES DRAWN AEROBIC ONLY 3CC   Final   Culture  Setup Time     Final   Value: 08/11/2013 17:39     Performed at [facility]   Culture     Final   Value: NO GROWTH 5 DAYS     Performed at [facility]   Report Status 08/17/2013 FINAL   Final  CULTURE, BLOOD (ROUTINE X 2)     Status: None   Collection Time    08/11/13 12:30 PM      Result Value Ref Range Status   Specimen Description BLOOD LEFT HAND   Final   Special Requests BOTTLES DRAWN AEROBIC ONLY 2 CC   Final   Culture  Setup Time     Final   Value: 08/11/2013 19:04     Performed at [facility]   Culture     Final   Value: NO GROWTH 5 DAYS     Performed at [facility]   Report Status 08/17/2013 FINAL   Final  AFB CULTURE WITH SMEAR     Status: None   Collection Time    08/12/13  1:34 PM      Result Value Ref Range Status   Specimen Description BRONCHIAL ALVEOLAR LAVAGE   Final   Special Requests NONE   Final   Acid Fast Smear     Final   Value: NO ACID FAST BACILLI SEEN     Performed at [facility]   Culture     Final   Value: CULTURE WILL BE EXAMINED FOR 6 WEEKS BEFORE ISSUING A FINAL REPORT     Performed at [facility]   Report Status PENDING   Incomplete  CULTURE, BAL-QUANTITATIVE     Status: None   Collection Time    08/12/13  1:34 PM      Result Value Ref Range Status    Specimen Description BRONCHIAL ALVEOLAR LAVAGE   Final   Special Requests NONE   Final   Gram Stain     Final   Value: MODERATE WBC PRESENT,BOTH PMN AND MONONUCLEAR     NO SQUAMOUS EPITHELIAL CELLS SEEN     MODERATE GRAM POSITIVE COCCI IN PAIRS     FEW YEAST     Performed at [facility]     Final   Value: >=100,000 COLONIES/ML     Performed at [facility]   Culture     Final   Value: Non-Pathogenic Oropharyngeal-type Flora Isolated.     Performed at [facility]   Report Status 08/14/2013 FINAL   Final  FUNGUS CULTURE W SMEAR     Status: None   Collection Time    08/12/13  1:34 PM      Result Value Ref Range Status   Specimen Description BRONCHIAL ALVEOLAR LAVAGE   Final   Special Requests NONE   Final   Fungal Smear     Final   Value: RARE YEAST     Performed at [facility]   Culture     Final   Value: CANDIDA ALBICANS     Performed at [facility]   Report Status PENDING   Incomplete     Studies:  Recent x-ray studies have been reviewed in detail by the Attending Physician  Scheduled Meds:  Scheduled Meds: . antiseptic oral rinse  15 mL Mouth Rinse QID  . chlorhexidine  15 mL Mouth Rinse BID  . feeding supplement (VITAL HIGH PROTEIN)  1,000 mL Per Tube Q24H  . free water  350 mL Per Tube 4 times per day  . insulin aspart  0-20 Units Subcutaneous 6 times per day  . lip balm  1 application Topical BID  . pantoprazole sodium  40 mg Per Tube Q1200    Time spent on care of this patient: 40 mins   Allie Bossier , MD   [facility] Office  [phone] Pager - [phone]  On-Call/Text Page:      [email or web address]      password [email or web address]  If 7PM-7AM, please contact night-coverage [email or web address] Password [email or web address] 08/21/2013, 9:30 AM   LOS: 23 days

## 2013-08-21 NOTE — Progress Notes (Signed)
Patient ID: [name], female   DOB: May 13, 1940, 73 y.o.   MRN: [id number] 20 Days Post-Op  Subjective: Sleeping, arousable.  Looks depressed.    Objective: Vital signs in last 24 hours: Temp:  [97.8 F (36.6 C)-98.7 F (37.1 C)] 98 F (36.7 C) (08/02 0400) Pulse Rate:  [86-107] 86 (08/02 0401) Resp:  [13-28] 13 (08/02 0401) BP: (93-114)/(44-50) 93/46 mmHg (08/02 0401) SpO2:  [94 %-100 %] 100 % (08/02 0401) FiO2 (%):  [28 %] 28 % (08/02 0401) Last BM Date: 08/18/13  Intake/Output from previous day: 08/01 0701 - 08/02 0700 In: 1000 [NG/GT:800; IV Piggyback:200] Out: 1550 [Urine:1350; Stool:200] Intake/Output this shift:    Trach in place Lungs clearer Abdomen soft, wound stable, ostomy working well  Lab Results:   Recent Labs  08/19/13 0445  WBC 9.2  HGB 8.8*  HCT 28.4*  PLT 211   BMET  Recent Labs  08/20/13 0501 08/21/13 0226  NA 145 136*  K 3.6* 3.6*  CL 111 102  CO2 28 24  GLUCOSE 131* 118*  BUN 32* 21  CREATININE 0.33* 0.32*  CALCIUM 8.2* 7.9*   PT/INR No results found for this basename: LABPROT, INR,  in the last 72 hours ABG No results found for this basename: PHART, PCO2, PO2, HCO3,  in the last 72 hours  Studies/Results: No results found.  Anti-infectives: Anti-infectives   Start     Dose/Rate Route Frequency Ordered Stop   08/13/13 1600  vancomycin (VANCOCIN) IVPB 1000 mg/200 mL premix  Status:  Discontinued     1,000 mg 200 mL/hr over 60 Minutes Intravenous Every 12 hours 08/13/13 1435 08/16/13 1243   08/11/13 1400  vancomycin (VANCOCIN) IVPB 750 mg/150 ml premix  Status:  Discontinued     750 mg 150 mL/hr over 60 Minutes Intravenous Every 12 hours 08/11/13 1138 08/13/13 1435   08/11/13 1300  imipenem-cilastatin (PRIMAXIN) 500 mg in sodium chloride 0.9 % 100 mL IVPB  Status:  Discontinued     500 mg 200 mL/hr over 30 Minutes Intravenous 3 times per day 08/11/13 1138 08/17/13 1055   08/11/13 1230  micafungin (MYCAMINE) 100 mg  in sodium chloride 0.9 % 100 mL IVPB  Status:  Discontinued     100 mg 100 mL/hr over 1 Hours Intravenous Daily 08/11/13 1138 08/12/13 1132   08/07/13 1400  piperacillin-tazobactam (ZOSYN) IVPB 3.375 g     3.375 g 12.5 mL/hr over 240 Minutes Intravenous 3 times per day 08/07/13 0921 08/10/13 0101   08/03/13 1800  vancomycin (VANCOCIN) IVPB 750 mg/150 ml premix  Status:  Discontinued     750 mg 150 mL/hr over 60 Minutes Intravenous Every 12 hours 08/03/13 0440 08/04/13 0910   08/03/13 0500  vancomycin (VANCOCIN) 1,250 mg in sodium chloride 0.9 % 250 mL IVPB     1,250 mg 166.7 mL/hr over 90 Minutes Intravenous  Once 08/03/13 0440 08/03/13 0646   08/03/13 0500  piperacillin-tazobactam (ZOSYN) IVPB 3.375 g  Status:  Discontinued     3.375 g 12.5 mL/hr over 240 Minutes Intravenous 3 times per day 08/03/13 0440 08/07/13 0921   08/01/13 1245  metroNIDAZOLE (FLAGYL) IVPB 500 mg     500 mg 100 mL/hr over 60 Minutes Intravenous To Surgery 08/01/13 1238 08/01/13 1300   07/31/13 0800  cefoTEtan (CEFOTAN) 2 g in dextrose 5 % 50 mL IVPB     2 g 100 mL/hr over 30 Minutes Intravenous On call to O.R. 07/31/13 0741 08/01/13 0559   07/31/13 0800  cefTRIAXone (ROCEPHIN) 2 g in dextrose 5 % 50 mL IVPB  Status:  Discontinued    Comments:  Pharmacy may adjust dosing strength / duration / interval for maximal efficacy   2 g 100 mL/hr over 30 Minutes Intravenous Every 24 hours 07/31/13 0744 08/03/13 0405   07/29/13 2100  ciprofloxacin (CIPRO) IVPB 400 mg  Status:  Discontinued     400 mg 200 mL/hr over 60 Minutes Intravenous Every 12 hours 07/29/13 2058 07/31/13 0744   07/29/13 2100  metroNIDAZOLE (FLAGYL) IVPB 500 mg  Status:  Discontinued     500 mg 100 mL/hr over 60 Minutes Intravenous Every 8 hours 07/29/13 2058 08/03/13 0405   07/29/13 2100  fluconazole (DIFLUCAN) IVPB 200 mg  Status:  Discontinued     200 mg 100 mL/hr over 60 Minutes Intravenous Every 24 hours 07/29/13 2058 08/04/13 0911   07/29/13  1930  ciprofloxacin (CIPRO) IVPB 400 mg     400 mg 200 mL/hr over 60 Minutes Intravenous  Once 07/29/13 1915 07/29/13 2031   07/29/13 1930  metroNIDAZOLE (FLAGYL) IVPB 500 mg  Status:  Discontinued     500 mg 100 mL/hr over 60 Minutes Intravenous  Once 07/29/13 1915 07/29/13 2107      Assessment/Plan: s/p Procedure(s): EXPLORATORY LAPAROTOMY (N/A) PARTIAL COLECTOMY (N/A) COLOSTOMY (N/A) SMALL BOWEL RESECTION (N/A)  Continue wound care and tube feeds. Trach care PT   LOS: 23 days    Adda Stokes 08/21/2013

## 2013-08-22 DIAGNOSIS — Z930 Tracheostomy status: Secondary | ICD-10-CM

## 2013-08-22 LAB — GLUCOSE, CAPILLARY
GLUCOSE-CAPILLARY: 118 mg/dL — AB (ref 70–99)
GLUCOSE-CAPILLARY: 113 mg/dL — AB (ref 70–99)
Glucose-Capillary: 125 mg/dL — ABNORMAL HIGH (ref 70–99)
Glucose-Capillary: 118 mg/dL — ABNORMAL HIGH (ref 70–99)
Glucose-Capillary: 123 mg/dL — ABNORMAL HIGH (ref 70–99)
Glucose-Capillary: 117 mg/dL — ABNORMAL HIGH (ref 70–99)
Glucose-Capillary: 146 mg/dL — ABNORMAL HIGH (ref 70–99)

## 2013-08-22 MED ORDER — OXYCODONE HCL 5 MG/5ML PO SOLN
5.0000 mg | ORAL | Status: DC | PRN
  Administered 2013-08-22 – 2013-08-23 (×3): 5 mg
  Filled 2013-08-22 (×3): qty 5

## 2013-08-22 MED ORDER — VITAL AF 1.2 CAL PO LIQD
1000.0000 mL | ORAL | Status: DC
  Filled 2013-08-22 (×6): qty 1000

## 2013-08-22 MED ORDER — VITAL AF 1.2 CAL PO LIQD
1000.0000 mL | ORAL | Status: DC
  Filled 2013-08-22 (×2): qty 1000

## 2013-08-22 MED ORDER — FENTANYL CITRATE 0.05 MG/ML IJ SOLN
25.0000 ug | INTRAMUSCULAR | Status: DC | PRN
  Administered 2013-08-22 – 2013-08-23 (×3): 25 ug via INTRAVENOUS
  Filled 2013-08-22 (×3): qty 2

## 2013-08-22 NOTE — Evaluation (Signed)
Clinical/Bedside Swallow Evaluation Patient Details  Name: [name] MRN: [id number] Date of Birth: 04/24/1940  Today's Date: 08/22/2013 Time: 2703-5009 SLP Time Calculation (min): 37 min  Past Medical History:  Past Medical History  Diagnosis Date  . Carotid artery disease     a. 60-70% bilat ICA stenosis by dopplers 05/2012.  Marland Kitchen Other and unspecified hyperlipidemia   . CAD (coronary artery disease)     a. Mod dz 2010 initially mgd medically. b. 12/2012 - angina s/p PTCA/DES to mid-circumflex, PTCA/DES to first OM.   Marland Kitchen Hypertension   . Heart murmur   . Depression   . Barrett's esophagus   . Arthritis    Past Surgical History:  Past Surgical History  Procedure Laterality Date  . Appendectomy    . Cholecystectomy    . Colon resection    . 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 Rotator cuff repair Left   . Abdominal hysterectomy    . Colon surgery    . Foot surgery    . Hand surgery    . Laparotomy N/A 08/01/2013    Procedure: EXPLORATORY LAPAROTOMY;  Surgeon: Harl Bowie, MD;  Location: [place];  Service: General;  Laterality: N/A;  . Partial colectomy N/A 08/01/2013    Procedure: PARTIAL COLECTOMY;  Surgeon: Harl Bowie, MD;  Location: [place];  Service: General;  Laterality: N/A;  . Colostomy N/A 08/01/2013    Procedure: COLOSTOMY;  Surgeon: Harl Bowie, MD;  Location: [place];  Service: General;  Laterality: N/A;  . Bowel resection N/A 08/01/2013    Procedure: SMALL BOWEL RESECTION;  Surgeon: Harl Bowie, MD;  Location: [place];  Service: General;  Laterality: N/A;  . Tracheostomy      feinstein   HPI:  73 y/o female with PMH CAD, HTN, Barrett's esophagus, presented to [facility] ED 7/10 with LLQ pain. Found to have bowel obstruction. To OR for repair 7/13 (Laparotomy, partial colectomy, colostomy, SB resection, repair of enterotomy, lysis of adhesions). Pt was extubated on 7/14 but required reintubation  until trach on 7/24. Trach collar trials on 7/26. Pt also found to have a small ICH on CT.    Assessment / Plan / Recommendation Clinical Impression  Pt demonstrates subtle signs of aspiration with thin liquids (late cough, watery eyes), but tolerates pudding very well. Recommend pt remain NPO tonight with NG, SLP will f/u tomorrow for [facility] for objective assessment of swallow. Expect pt to initiate at least a modified diet tomorrow am.     Aspiration Risk  Moderate    Diet Recommendation          Other  Recommendations Recommended Consults: MBS   Follow Up Recommendations  Inpatient Rehab    Frequency and Duration        Pertinent Vitals/Pain NA    SLP Swallow Goals     Swallow Study Prior Functional Status       General HPI: 73 y/o female with PMH CAD, HTN, Barrett's esophagus, presented to [facility] ED 7/10 with LLQ pain. Found to have bowel obstruction. To OR for repair 7/13 (Laparotomy, partial colectomy, colostomy, SB resection, repair of enterotomy, lysis of adhesions). Pt was extubated on 7/14 but required reintubation until trach on 7/24. Trach collar trials on 7/26. Pt also found to have a small ICH on CT.  Type of Study: Bedside swallow evaluation Diet Prior to this  Study: NPO Temperature Spikes Noted: No Respiratory Status: Trach collar Trach Size and Type: #4;Uncuffed;With PMSV in place History of Recent Intubation: Yes Length of Intubations (days): 12 days Date extubated: 09/12/13 Behavior/Cognition: Alert;Cooperative;Pleasant mood Oral Cavity - Dentition: Dentures, top Self-Feeding Abilities: Total assist Patient Positioning: Upright in bed Baseline Vocal Quality: Clear Volitional Cough: Weak Volitional Swallow: Able to elicit    Oral/Motor/Sensory Function Overall Oral Motor/Sensory Function: Appears within functional limits for tasks assessed   Ice Chips     Thin Liquid Thin Liquid: Impaired Presentation: Cup;Straw Oral Phase Functional Implications:  Prolonged oral transit;Oral holding Pharyngeal  Phase Impairments: Suspected delayed Swallow;Cough - Immediate    Nectar Thick Nectar Thick Liquid: Not tested   Honey Thick Honey Thick Liquid: Not tested   Puree Puree: Within functional limits   Solid   GO    Solid: Not tested      Herbie Baltimore, MA CCC-SLP [phone]  Lynann Beaver 08/22/2013,4:49 PM

## 2013-08-22 NOTE — Progress Notes (Signed)
PULMONARY / CRITICAL CARE MEDICINE  Name: [name] MRN: [id number] DOB: 08-17-1940    ADMISSION DATE: 07/29/2013  CONSULTATION DATE: 08/01/2013   REFERRING MD : Surgery  PRIMARY SERVICE: TRH  CHIEF COMPLAINT: Bowel obstruction   BRIEF PATIENT DESCRIPTION: 73 yo admitted 7/10 with bowel obstruction requiring surgery on 7/13. Course was complicated by HCAP, ARDS and prolonged respiratory failure requiring tracheostomy.  SIGNIFICANT EVENTS / STUDIES:   LINES / TUBES:  Trach (DF) 7/24 >>>  R IJ CVL 7/14 >>>   INTERVAL HISTORY:  No respiratory issues overnight  VITAL SIGNS: Temp:  [97.4 F (36.3 C)-98.3 F (36.8 C)] 97.5 F (36.4 C) (08/03 0742) Pulse Rate:  [86-96] 96 (08/03 0801) Resp:  [19-31] 31 (08/03 0801) BP: (97-113)/(34-48) 113/48 mmHg (08/03 0742) SpO2:  [100 %] 100 % (08/03 0742) FiO2 (%):  [28 %] 28 % (08/03 0801)  PHYSICAL EXAMINATION: General:  Chronically ill appearing female, NAD in bed  Neuro:  Awake, alert, intermittently confused but overall appropriate, oriented x 3 HEENT:  Mm moist, no JVD, trach site c/d, mod amt secretions, #6 cuffed shiley -- VERY LITTLE phonation with PMV,  Cardiovascular:  s1s2 rrr Lungs:  resps even non labored on ATC, little to no voice with PMV but sats remain 100%, few scatt rhonchi  Abdomen:  Soft, +bs, ostomy functioning, wound unchanged  Musculoskeletal:  Warm and dry, 1+ BLE edema   LABS:  Recent Labs Lab 08/19/13 0445 08/20/13 0501 08/21/13 0226  NA 151* 145 136*  K 3.8 3.6* 3.6*  CL 115* 111 102  CO2 28 28 24   BUN 35* 32* 21  CREATININE 0.37* 0.33* 0.32*  GLUCOSE 129* 131* 118*    Recent Labs Lab 08/16/13 0405 08/17/13 0500 08/19/13 0445  HGB 8.6* 8.5* 8.8*  HCT 26.7* 27.9* 28.4*  WBC 11.8* 9.5 9.2  PLT 268 262 211   IMAGING:  Dg Chest Port 1 View  08/21/2013   CLINICAL DATA:  Shortness of breath, tracheostomy  EXAM: PORTABLE CHEST - 1 VIEW  COMPARISON:  08/17/2013  FINDINGS: Cardiomegaly  with pulmonary vascular congestion and suspected mild interstitial edema. Patchy bilateral lower lobe opacities, possibly atelectasis, left lower lobe pneumonia not excluded. Small bilateral pleural effusions, left greater than right. No pneumothorax.  Tracheostomy in satisfactory position.  Right IJ venous catheter terminates at the cavoatrial junction.  Enteric tube courses into the stomach.  IMPRESSION: Cardiomegaly with mild interstitial edema.  Patchy bilateral lower lobe opacities, possibly atelectasis, left lower lobe not excluded.  Small bilateral pleural effusions, left greater than right.  Tracheostomy in satisfactory position. Additional support apparatus as above.   Electronically Signed   By: Julian Hy M.D.   On: 08/21/2013 10:37   ASSESSMENT / PLAN:  Acute respiratory failure HCAP ARDS, less likely pulmonary edema Tracheostomy status   Goal SpO2>92  Supplemental oxygen via trach collar   Diuresis per primary team  SLP / [place] as toperated  Would not decannulate  Will downsize to #4 in hopes that it would assist with PMV tolerance / phonation  Nickolas Madrid, NP 08/22/2013  9:29 AM Pager: [phone] or [phone]  I have personally obtained history, examined patient, evaluated and interpreted laboratory and imaging results, reviewed medical records, formulated assessment / plan and placed orders.  Doree Fudge, MD Pulmonary and [place] Pager: [phone]  08/22/2013, 3:07 PM

## 2013-08-22 NOTE — Procedures (Signed)
Tracheostomy tube change: Informed verbal consent was obtained after explaining the risks (including bleeding and infection), benefits and alternatives of the procedure. Verbal timeout was performed prior to the procedure. The old  # 6  Cuffed trach was carefully removed. the tracheostomy site appeared: unremarkable w/ no significant erythema or drainage . A new #  4 Cuffless trach was easily placed in the tracheostomy stoma and secured with velcro trach ties. The tracheostomy was patent, good color change observed via EZ-CAP, and the patient was  able to weakly phonate with finger occlusion and tolerated the procedure well with no immediate complications.   Marni Griffon ACNP-BC [place] Pager # [phone] OR # [phone] if no answer  Supervised and present through the entire procedure.  Doree Fudge, MD Pulmonary and [place] Pager: [phone]

## 2013-08-22 NOTE — Progress Notes (Signed)
Rehab admissions - We have been following pt's case and I spoke with Elliot Cousin RN and discussed pt's case. Pt has not made significant overall progress over the course of the weekend and is not an appropriate candidate for acute inpatient rehab due to her decreased activity tolerance. At this point, [facility] Rossetti is upholding the denial for inpatient rehab and recommend that skilled nursing be pursued.  I called and updated Henrietta with case management who will update Dr. Thereasa Solo and medical team. SNF or LTACH is most appropriate discharge plan at this point based on pt's limited overall progress.  I will now sign off pt's case.   Nanetta Batty, PT Rehabilitation Admissions Coordinator [phone]

## 2013-08-22 NOTE — Progress Notes (Signed)
[facility] Progress Note  21 Days Post-Op  Subjective: Pt doing better, pain minimal, still has nausea, but tolerating TF well.  Ostomy functioning well with brownish orange stools.  Getting up to chair.  Still on 28% FIO2 on trach collar.  Objective: Vital signs in last 24 hours: Temp:  [97.4 F (36.3 C)-98.3 F (36.8 C)] 97.5 F (36.4 C) (08/03 0742) Pulse Rate:  [86-96] 96 (08/03 0801) Resp:  [19-31] 31 (08/03 0801) BP: (97-113)/(34-48) 113/48 mmHg (08/03 0742) SpO2:  [100 %] 100 % (08/03 0742) FiO2 (%):  [28 %] 28 % (08/03 0801) Last BM Date: 08/21/13  Intake/Output from previous day: 08/02 0701 - 08/03 0700 In: 750 [NG/GT:750] Out: 4000 [Urine:3700; Stool:300] Intake/Output this shift: Total I/O In: -  Out: 250 [Urine:250]  PE: Gen: Alert, NAD, pleasant  Abd: Soft, tender to palpation over midline wound, +BS, no HSM, midline wound with some granulation tissue, but some light brown/tan slough with evident wound dehiscence and mild separation (approximately the same)    Lab Results:  No results found for this basename: WBC, HGB, HCT, PLT,  in the last 72 hours BMET  Recent Labs  08/20/13 0501 08/21/13 0226  NA 145 136*  K 3.6* 3.6*  CL 111 102  CO2 28 24  GLUCOSE 131* 118*  BUN 32* 21  CREATININE 0.33* 0.32*  CALCIUM 8.2* 7.9*   PT/INR No results found for this basename: LABPROT, INR,  in the last 72 hours CMP     Component Value Date/Time   NA 136* 08/21/2013 0226   K 3.6* 08/21/2013 0226   CL 102 08/21/2013 0226   CO2 24 08/21/2013 0226   GLUCOSE 118* 08/21/2013 0226   BUN 21 08/21/2013 0226   CREATININE 0.32* 08/21/2013 0226   CALCIUM 7.9* 08/21/2013 0226   PROT 4.5* 08/21/2013 0226   ALBUMIN 2.0* 08/21/2013 0226   AST 24 08/21/2013 0226   ALT 40* 08/21/2013 0226   ALKPHOS 82 08/21/2013 0226   BILITOT 0.5 08/21/2013 0226   GFRNONAA >90 08/21/2013 0226   GFRAA >90 08/21/2013 0226   Lipase     Component Value Date/Time   LIPASE 9* 07/29/2013 1605        Studies/Results: Dg Chest Port 1 View  08/21/2013   CLINICAL DATA:  Shortness of breath, tracheostomy  EXAM: PORTABLE CHEST - 1 VIEW  COMPARISON:  08/17/2013  FINDINGS: Cardiomegaly with pulmonary vascular congestion and suspected mild interstitial edema. Patchy bilateral lower lobe opacities, possibly atelectasis, left lower lobe pneumonia not excluded. Small bilateral pleural effusions, left greater than right. No pneumothorax.  Tracheostomy in satisfactory position.  Right IJ venous catheter terminates at the cavoatrial junction.  Enteric tube courses into the stomach.  IMPRESSION: Cardiomegaly with mild interstitial edema.  Patchy bilateral lower lobe opacities, possibly atelectasis, left lower lobe not excluded.  Small bilateral pleural effusions, left greater than right.  Tracheostomy in satisfactory position. Additional support apparatus as above.   Electronically Signed   By: Julian Hy M.D.   On: 08/21/2013 10:37    Anti-infectives: Anti-infectives   Start     Dose/Rate Route Frequency Ordered Stop   08/13/13 1600  vancomycin (VANCOCIN) IVPB 1000 mg/200 mL premix  Status:  Discontinued     1,000 mg 200 mL/hr over 60 Minutes Intravenous Every 12 hours 08/13/13 1435 08/16/13 1243   08/11/13 1400  vancomycin (VANCOCIN) IVPB 750 mg/150 ml premix  Status:  Discontinued     750 mg 150 mL/hr over 60 Minutes  Intravenous Every 12 hours 08/11/13 1138 08/13/13 1435   08/11/13 1300  imipenem-cilastatin (PRIMAXIN) 500 mg in sodium chloride 0.9 % 100 mL IVPB  Status:  Discontinued     500 mg 200 mL/hr over 30 Minutes Intravenous 3 times per day 08/11/13 1138 08/17/13 1055   08/11/13 1230  micafungin (MYCAMINE) 100 mg in sodium chloride 0.9 % 100 mL IVPB  Status:  Discontinued     100 mg 100 mL/hr over 1 Hours Intravenous Daily 08/11/13 1138 08/12/13 1132   08/07/13 1400  piperacillin-tazobactam (ZOSYN) IVPB 3.375 g     3.375 g 12.5 mL/hr over 240 Minutes Intravenous 3 times per day  08/07/13 0921 08/10/13 0101   08/03/13 1800  vancomycin (VANCOCIN) IVPB 750 mg/150 ml premix  Status:  Discontinued     750 mg 150 mL/hr over 60 Minutes Intravenous Every 12 hours 08/03/13 0440 08/04/13 0910   08/03/13 0500  vancomycin (VANCOCIN) 1,250 mg in sodium chloride 0.9 % 250 mL IVPB     1,250 mg 166.7 mL/hr over 90 Minutes Intravenous  Once 08/03/13 0440 08/03/13 0646   08/03/13 0500  piperacillin-tazobactam (ZOSYN) IVPB 3.375 g  Status:  Discontinued     3.375 g 12.5 mL/hr over 240 Minutes Intravenous 3 times per day 08/03/13 0440 08/07/13 0921   08/01/13 1245  metroNIDAZOLE (FLAGYL) IVPB 500 mg     500 mg 100 mL/hr over 60 Minutes Intravenous To Surgery 08/01/13 1238 08/01/13 1300   07/31/13 0800  cefoTEtan (CEFOTAN) 2 g in dextrose 5 % 50 mL IVPB     2 g 100 mL/hr over 30 Minutes Intravenous On call to O.R. 07/31/13 0741 08/01/13 0559   07/31/13 0800  cefTRIAXone (ROCEPHIN) 2 g in dextrose 5 % 50 mL IVPB  Status:  Discontinued    Comments:  Pharmacy may adjust dosing strength / duration / interval for maximal efficacy   2 g 100 mL/hr over 30 Minutes Intravenous Every 24 hours 07/31/13 0744 08/03/13 0405   07/29/13 2100  ciprofloxacin (CIPRO) IVPB 400 mg  Status:  Discontinued     400 mg 200 mL/hr over 60 Minutes Intravenous Every 12 hours 07/29/13 2058 07/31/13 0744   07/29/13 2100  metroNIDAZOLE (FLAGYL) IVPB 500 mg  Status:  Discontinued     500 mg 100 mL/hr over 60 Minutes Intravenous Every 8 hours 07/29/13 2058 08/03/13 0405   07/29/13 2100  fluconazole (DIFLUCAN) IVPB 200 mg  Status:  Discontinued     200 mg 100 mL/hr over 60 Minutes Intravenous Every 24 hours 07/29/13 2058 08/04/13 0911   07/29/13 1930  ciprofloxacin (CIPRO) IVPB 400 mg     400 mg 200 mL/hr over 60 Minutes Intravenous  Once 07/29/13 1915 07/29/13 2031   07/29/13 1930  metroNIDAZOLE (FLAGYL) IVPB 500 mg  Status:  Discontinued     500 mg 100 mL/hr over 60 Minutes Intravenous  Once 07/29/13 1915  07/29/13 2107       Assessment/Plan Colon obstruction POD #21 s/p Ex lap, partial colectomy, end colostomy with Hartmanns procedure, SBR, repair of multiple enterotomies, and LOA  Acute respiratory failure post op s/p trach on 7/25  Post op wound dehiscence  PCM  Inability to take PO   Plan:  1. Seems more alert, more commutative, continue with current medical treatment. Appreciate CCM and medicine's care of this patient  2. Continue tube feeds  3. Antiemetics, pain meds prn, NG tube for tube feeding  4. Keep abdominal binder in place to prevent further wound  dehiscence. The abdominal binder is okay to be over top the ostomy, but the ostomy needs to be emptied regularly to prevent accidental leakage due to a full bag.  5. Continue WD dressing changes to midline wound BID 6. Will see and evaluate again on Wednesday    LOS: 24 days    DORT, Suhail Peloquin 08/22/2013, 9:20 AM Pager: [phone]

## 2013-08-22 NOTE — Consult Note (Addendum)
WOC ostomy follow up  CCS following for assessment and plan of care to abd wound, please refer to this team for further questions.  Stomal assessment/size: 1 1/2 inch, pink and viable, above skin  Peristomal assessment: skin intact surrounding stoma. [place] at 9:00 o'clock near abd wound  Output : Mod amt of brown liquid stool in bedside drainage bag. Ostomy pouching: 1 piece urostomy pouch applied with barrier ring to facilitate drainage and decrease chance of leakage. Husband at bedside assisted with pouch application. Pt with trach and unable to verbalize; did not participate in teaching session. Supplies at bedside for staff nurses.  Bedside binder intact over abd wound; opening cut for ostomy bag to facilitate drainage.   Julien Girt MSN, RN, [place], [place], [place]

## 2013-08-22 NOTE — Progress Notes (Signed)
Pt seen this evening a little before 7pm Appears to be doing better.   Discussed with pa earlier in day  Nothing to add  Leighton Ruff. Redmond Pulling, MD, FACS General, Bariatric, & Minimally Invasive Surgery [facility], [place]

## 2013-08-22 NOTE — Progress Notes (Signed)
[facility] TEAM 1 - Stepdown/ICU TEAM Progress Note  [name] HLK:[id number] DOB: 1940-02-19 DOA: 07/29/2013 PCP: Laverna Peace, NP  Brief Narrative: 73 y.o. Female who presented with a 2 week history of increased abdominal pain with constipation and nausea but no vomiting.   Her history started on April 10 when she had lysis of adhesions and ovarian cystectomy for small bowel obstruction. Her family reported that she had a complication of anesthesia during the surgery. She returned to the hospital few days later with abdominal pain that was diagnosed as colitis and she was discharged home on Levaquin and Flagyl. Her pain improved slightly but she started having abdominal pain again and was admitted to [facility] and was continued on antibiotics with no need for surgical intervention. Gastroenterology followed the patient and did not recommend aggressive intervention so she was discharged home. She started having worsening of her abdominal pain over the previous 2 weeks associated with nausea but no vomiting. She reported severe constipation and had not had any bowel movement for 1 week. Patient had a colonoscopy one week prior to presentation but they could not cross beyond the splenic flexure according to the husband.   She presented to the ER on 710 because of the above symptoms. CT of the abdomen was done and showed obstruction at the level of the colon with small bowel distention. Surgery was consulted. She went to the OR for repair 7/13 and remained on the vent post-op.   HPI/Subjective: Alert and nods yes when asked if having surgical site pain- denied SOB  Assessment/Plan:  Colonic obstruction due to suspected colitis; s/p colectomy/colostomy  -per surgery  -issues with wound dehiscence post op so requiring abd binder (in place properly) -adjust pain meds to longer acting per tube meds to facilitate continued improvement in respiratory status   Anasarca  -due to  malnutrition and critical illness   Acute respiratory failure with hypoxia/Tracheostomy status  -etiology unclear but differential included ARDS, HCAP  -currently has pulmonary edema likely from 3rd spacing in setting of hypoalbunemia that is slowly improving -stable on TC  -PCCM plans downsize trach today 8/3 -given 1x dose of Lasix 8/2  Dehydration with hypernatremia  -Improved with increased free water continue water per tube to 350 ml QID   Protein-calorie malnutrition, severe w/ electrolyte imbalance  -cont tube feeding per NGT  -prn replete electrolytes    Hypokalemia -repleted - repeat labs  HTN (hypertension)  -prn Lopressor  DVT of axillary vein, acute right  -Heparin on hold due to recent issues with GIB sx's   Very small cerebral bleed  -tiny SAH vs SDH seen on CT head from 7/23 - completed due to altered mentation   Anemia  -baseline Hgb 11  -currently stable   CAD (coronary artery disease)  -Hold for now  Depression   Barrett's esophagus   Physical deconditioning  -? CIR-insurance payor to reevaluate  Code Status: DNR Family Communication: Husband present  Disposition Plan: Stepdown  Consultants: Dr. Simonne Maffucci (PCCM.) Dr. Judeth Horn (surgery)  Procedure/Significant Events: 7/10 Admit, CT Abd - dilated prox colon w/ abrupt transition c/w with obstruction, diverticulitis vs colitis, Surgery c/s  7/12 Empiric Cipro/Flagyl, Cardiology consulted for pre-op evaluation, TPN per pharm  7/13 - Laparotomy, partial colectomy, colostomy, SB resection, repair of enterotomy, lysis of adhesions  - Hgb down to 6.8 >> imp to 10.8 s/p 2u PRBC  7/14 - Extubated, required re-intubation due to hypoxemic resp failure, hypotension, concern for aspiration  7/15 -  2D echo>>> severely dilated LA, normal systolic function  2/42 - Considered ARDS (unclear etiology, ?aspiration), CXR (persistent b/l edema), cont pressors, stable low PCT 1.9  7/17 - CT abd >no bleeding  , no abscess, sm fluid in abd/pelvis  7/17 - Ven Dopp>+DVT Rt axillary vein  7/23 -CT Head (subcentimeter focal parenchymal hemorrhage - no SAH or subdural, no acute ischemic change)  - CT chest w/ contrast (severe multilobar pneumonia, moderate b/l pleural effusions, also favor chronicity  - CT Abd/Pelvis (IV, PO) (post-op changes, s/p L-hemicolectomy, transverse colon markedly thickened, unclear if related to acute inflammation/infection vs secondary distention/post-op edema)  7/24 - s/p bedside trach (DF) / bronchoscopy  7/25 - melena with dec Hgb  7/28 - tol ATC  8/2 PCXR;Cardiomegaly with mild interstitial edema.-Patchy bilateral lower lobe opacities, possibly atelectasis, left lower lobe not excluded.  -Small bilateral pleural effusions, Lt>Rt.  -Tracheostomy in satisfactory position.  Culture Blood x 2, 7/15 >> negative  Respiratory 7/15 >> few Candida  Repeat Blood x2, 7/23 >>>  BAL 7/24>>>normal flora  Antibiotics: Ceftriaxone 7/10 >>> 7/14  Flagyl 7/10 >>> 7/14  Vancomycin 7/14 >>> 7/16  Fluconazole 7/10 >>> 7/16  Zosyn 7/14 >> 7/21  Vancomycin 7/23 >>>off  Primaxin 7/23 >>>7/29  Micafungin 7/23 >>>7/24  DVT prophylaxis: SCD  Objective: VITAL SIGNS: Temp: 97.7 F (36.5 C) (08/03 1140) Temp src: Oral (08/03 1140) BP: 96/48 mmHg (08/03 1140) Pulse Rate: 97 (08/03 1140) SPO2; 100% on trach collar 5 L/minute;  FIO2: 28%   Intake/Output Summary (Last 24 hours) at 08/22/13 1404 Last data filed at 08/22/13 1200  Gross per 24 hour  Intake    975 ml  Output   3850 ml  Net  -2875 ml   Exam: Gen: A./O x4.,No acute respiratory distress  Chest:  Diffuse Coarse BS to auscultation  without any definitive rhonchi or wheezes, trach collar  Cardiac: Regular rate and rhythm, S1-S2, no rubs murmurs or gallops, no peripheral edema, no JVD  Abdomen: Soft tender expected tenderness over surgical incision, nondistended without obvious hepatosplenomegaly, colostomy with yellow  brown liquid stool in drainage bag, NG tube patent with tube feeding on hold in anticipation of trach change out Extremities: Symmetrical in appearance without cyanosis, clubbing or effusion  Data Reviewed: Basic Metabolic Panel:  Recent Labs Lab 08/16/13 0405  08/17/13 0500 08/17/13 1247 08/18/13 0500 08/19/13 0445 08/20/13 0501 08/21/13 0226  NA 146  < > 150* 153* 149* 151* 145 136*  K 2.8*  < > 3.0* 4.0 3.5* 3.8 3.6* 3.6*  CL 105  < > 109 111 112 115* 111 102  CO2 31  < > 30 32 29 28 28 24   GLUCOSE 160*  < > 136* 156* 94 129* 131* 118*  BUN 24*  < > 29* 30* 34* 35* 32* 21  CREATININE 0.37*  < > 0.41* 0.37* 0.35* 0.37* 0.33* 0.32*  CALCIUM 7.9*  < > 7.8* 8.1* 8.1* 8.4 8.2* 7.9*  MG 1.7  --  2.1  --   --   --   --  1.4*  PHOS 3.0  --   --   --  2.3  --   --   --   < > = values in this interval not displayed.  Liver Function Tests:  Recent Labs Lab 08/20/13 0501 08/21/13 0226  AST 18 24  ALT 40* 40*  ALKPHOS 81 82  BILITOT 0.5 0.5  PROT 4.8* 4.5*  ALBUMIN 2.1* 2.0*   No results found  for this basename: LIPASE, AMYLASE,  in the last 168 hours No results found for this basename: AMMONIA,  in the last 168 hours  CBC:  Recent Labs Lab 08/16/13 0405 08/17/13 0500 08/19/13 0445  WBC 11.8* 9.5 9.2  HGB 8.6* 8.5* 8.8*  HCT 26.7* 27.9* 28.4*  MCV 91.1 92.4 94.7  PLT 268 262 211   Cardiac Enzymes: No results found for this basename: CKTOTAL, CKMB, CKMBINDEX, TROPONINI,  in the last 168 hours BNP (last 3 results)  Recent Labs  08/03/13 0930  PROBNP 3782.0*   CBG:  Recent Labs Lab 08/21/13 1950 08/22/13 0003 08/22/13 0443 08/22/13 0740 08/22/13 1139  GLUCAP 118* 125* 118* 123* 113*    No results found for this or any previous visit (from the past 240 hour(s)).   Studies:  Recent x-ray studies have been reviewed in detail by the Attending Physician  Scheduled Meds:  Scheduled Meds: . antiseptic oral rinse  15 mL Mouth Rinse QID  .  chlorhexidine  15 mL Mouth Rinse BID  . free water  350 mL Per Tube 4 times per day  . insulin aspart  0-20 Units Subcutaneous 6 times per day  . lip balm  1 application Topical BID  . pantoprazole sodium  40 mg Per Tube Q1200    Time spent on care of this patient: 35 mins   ELLIS,ALLISON L. , ANP  [facility] Office  [phone] Pager - [phone]  On-Call/Text Page:      [email or web address]      password [email or web address]  If 7PM-7AM, please contact night-coverage [email or web address] Password [email or web address] 08/22/2013, 2:04 PM   LOS: 24 days   I have personally examined this patient and reviewed the entire database. I have reviewed the above note, made any necessary editorial changes, and agree with its content.  Cherene Altes, MD [facility]

## 2013-08-22 NOTE — Progress Notes (Signed)
Speech Language Pathology Treatment: [name] Speaking valve  Patient Details Name: [name] MRN: [id number] DOB: 07/20/1940 Today's Date: 08/22/2013 Time: 0370-4888 SLP Time Calculation (min): 37 min  Assessment / Plan / Recommendation Clinical Impression  Following trach change the pt is able to tolerate PMSV for over 20 minutes with adequate phonation, no change in vitals and no evidence of air trapping. Pts volume improving throughout session with cues for deeper breathing. Able to talk to family member on the phone and express basic wants and needs with min verbal cues to verbalize and not gesture. Pts husband at bedside and already attempting to manage valve prior to instructions. SLP provided verbal instruction, demonstration and written instruction for PMSV use and precautions. Pts husband return demonstrated placement and removal and verbalized precaution to provide full supervision, remove valve with sleep and inform RN immediately of any difficulty. See next note for swallow assessment.    HPI HPI: 73 y/o female with PMH CAD, HTN, Barrett's esophagus, presented to [facility] ED 7/10 with LLQ pain. Found to have bowel obstruction. To OR for repair 7/13 (Laparotomy, partial colectomy, colostomy, SB resection, repair of enterotomy, lysis of adhesions). Pt was extubated on 7/14 but required reintubation until trach on 7/24. Trach collar trials on 7/26. Pt also found to have a small ICH on CT.    Pertinent Vitals NA  SLP Plan  Continue with current plan of care    Recommendations        Patient may use Passy-Muir Speech Valve: During all waking hours (remove during sleep) PMSV Supervision: Full       General recommendations: Rehab consult Follow up Recommendations: Inpatient Rehab Plan: Continue with current plan of care    GO     [name], Katherene Ponto 08/22/2013, 4:39 PM

## 2013-08-22 NOTE — Progress Notes (Addendum)
NUTRITION FOLLOW UP  Intervention:    D/C Vital HP formula Initiate Vital AF 1.2 formula at 15 ml/hr and increase by 10 ml every 4 hours to goal rate of 55 ml/hr to provide 1584 kcals, 99 gm protein, 1071 ml of free water RD to follow for nutrition care plan  Nutrition Dx:   Inadequate oral intake related to inability to eat as evidenced by NPO status, ongoing  Goal:   Intake to meet >90% of estimated nutrition needs, currently unmet  Monitor:   TF regimen & tolerance, respiratory status, weight, labs, I/O's  Assessment:   PMHx significant for diverticulitis, CAD, HTN, depression. Admitted with abdominal pain and c/n x 2 weeks. Hx of lysis of adhesions and ovarian cystectomy for SBO in April 2015. Work-up reveals colonic obstruction proximal to descending colon, MD suspects etiology of ischemia vs malignancy.  S/P laparotomy, partial colectomy, colostomy, SB resection, repair of enterotomy, lysis of adhesions on 7/13. Received TPN from 7/13 to 7/20 when TPN was discontinued.  7/27:  TF was discontinued after trach placement on 7/24 due to bleeding from ostomy site. TF resumed on 7/26 at 20 ml/h.  Patient is currently receiving Vital High Protein at 20 ml/h (480 ml per day) to provide 480 kcals (29% of estimated needs), 42 gm protein (46% of estimated needs), 401 ml free water daily.  Spoke with Dr. Hulen Skains regarding advancing TF rate, okay for RD to increase rate by 10 ml every 4 hours to goal of 50 ml/h.  8/3:  Patient remains on trach collar.  Not ready for decannulation.  Vital HP formula currently infusing at 50 ml/hr via small bore feeding tube (tip in distal stomach) providing 1200 kcals, 105 gm protein, 1003 ml free water daily.  Free water flushes at 350 ml every 6 hours.  SLP following for PM valve.  Height: Ht Readings from Last 1 Encounters:  08/02/13 5' (1.524 m)    Ideal weight: 45.5 kg  Weight Status:   Wt Readings from Last 1 Encounters:  08/17/13 160 lb 4.4 oz (72.7  kg)  07/29/13  154 lb 3.2 oz (69.945 kg)    Body mass index is 31.3 kg/(m^2).  Re-estimated needs:  Kcal: 1600-1800 Protein: 95-105 gm  Fluid: 1.6-1.8 L  Skin: abdominal incision  Diet Order: NPO   Intake/Output Summary (Last 24 hours) at 08/22/13 1011 Last data filed at 08/22/13 0743  Gross per 24 hour  Intake    750 ml  Output   4250 ml  Net  -3500 ml    Labs:   Recent Labs Lab 08/16/13 0405  08/17/13 0500  08/18/13 0500 08/19/13 0445 08/20/13 0501 08/21/13 0226  NA 146  < > 150*  < > 149* 151* 145 136*  K 2.8*  < > 3.0*  < > 3.5* 3.8 3.6* 3.6*  CL 105  < > 109  < > 112 115* 111 102  CO2 31  < > 30  < > 29 28 28 24   BUN 24*  < > 29*  < > 34* 35* 32* 21  CREATININE 0.37*  < > 0.41*  < > 0.35* 0.37* 0.33* 0.32*  CALCIUM 7.9*  < > 7.8*  < > 8.1* 8.4 8.2* 7.9*  MG 1.7  --  2.1  --   --   --   --  1.4*  PHOS 3.0  --   --   --  2.3  --   --   --   GLUCOSE 160*  < >  136*  < > 94 129* 131* 118*  < > = values in this interval not displayed.  CBG (last 3)   Recent Labs  08/22/13 0003 08/22/13 0443 08/22/13 0740  GLUCAP 125* 118* 123*    Scheduled Meds: . antiseptic oral rinse  15 mL Mouth Rinse QID  . chlorhexidine  15 mL Mouth Rinse BID  . feeding supplement (VITAL HIGH PROTEIN)  1,000 mL Per Tube Q24H  . free water  350 mL Per Tube 4 times per day  . insulin aspart  0-20 Units Subcutaneous 6 times per day  . lip balm  1 application Topical BID  . pantoprazole sodium  40 mg Per Tube Q1200    Continuous Infusions:    Arthur Holms, RD, LDN Pager #: [phone] After-Hours Pager #: [phone]

## 2013-08-22 NOTE — Progress Notes (Signed)
Rehab admissions - I am following pt's case and plan to submit latest clinical information to [facility] later today after PT and SLP have seen the patient.   In order to consider a possible rehab admission, we will need insurance authorization from [facility], medical clearance and have a bed available in inpatient rehab.  I will continue to follow pt's case and keep the team/pt and family aware of any updates. Thanks.  Nanetta Batty, PT Rehabilitation Admissions Coordinator [phone]

## 2013-08-22 NOTE — Progress Notes (Signed)
PT Cancellation Note  Patient Details Name: [name] MRN: [id number] DOB: Jul 19, 1940   Cancelled Treatment:    Reason Eval/Treat Not Completed: Other (comment) (pt just back to bed after 4 hours in recliner and fatigued). Will try again tomorrow.   [name] 08/22/2013, 2:31 PM

## 2013-08-23 ENCOUNTER — Inpatient Hospital Stay (HOSPITAL_COMMUNITY): Payer: Medicare HMO

## 2013-08-23 DIAGNOSIS — I428 Other cardiomyopathies: Secondary | ICD-10-CM

## 2013-08-23 LAB — CBC
HCT: 23.1 % — ABNORMAL LOW (ref 36.0–46.0)
Hemoglobin: 7.5 g/dL — ABNORMAL LOW (ref 12.0–15.0)
MCH: 29.2 pg (ref 26.0–34.0)
MCHC: 32.5 g/dL (ref 30.0–36.0)
MCV: 89.9 fL (ref 78.0–100.0)
PLATELETS: 143 10*3/uL — AB (ref 150–400)
RBC: 2.57 MIL/uL — ABNORMAL LOW (ref 3.87–5.11)
RDW: 17.9 % — ABNORMAL HIGH (ref 11.5–15.5)
WBC: 7.8 10*3/uL (ref 4.0–10.5)

## 2013-08-23 LAB — GLUCOSE, CAPILLARY
GLUCOSE-CAPILLARY: 110 mg/dL — AB (ref 70–99)
Glucose-Capillary: 146 mg/dL — ABNORMAL HIGH (ref 70–99)
Glucose-Capillary: 111 mg/dL — ABNORMAL HIGH (ref 70–99)
Glucose-Capillary: 120 mg/dL — ABNORMAL HIGH (ref 70–99)
Glucose-Capillary: 142 mg/dL — ABNORMAL HIGH (ref 70–99)
Glucose-Capillary: 78 mg/dL (ref 70–99)

## 2013-08-23 LAB — POTASSIUM: Potassium: 4.1 mEq/L (ref 3.7–5.3)

## 2013-08-23 LAB — BASIC METABOLIC PANEL
Anion gap: 11 (ref 5–15)
BUN: 18 mg/dL (ref 6–23)
CO2: 27 mEq/L (ref 19–32)
Calcium: 8 mg/dL — ABNORMAL LOW (ref 8.4–10.5)
Chloride: 97 mEq/L (ref 96–112)
Creatinine, Ser: 0.34 mg/dL — ABNORMAL LOW (ref 0.50–1.10)
Glucose, Bld: 143 mg/dL — ABNORMAL HIGH (ref 70–99)
Potassium: 3 mEq/L — ABNORMAL LOW (ref 3.7–5.3)
SODIUM: 135 meq/L — AB (ref 137–147)

## 2013-08-23 LAB — MAGNESIUM: Magnesium: 1.5 mg/dL (ref 1.5–2.5)

## 2013-08-23 MED ORDER — MAGNESIUM SULFATE 40 MG/ML IJ SOLN
2.0000 g | Freq: Once | INTRAMUSCULAR | Status: AC
  Administered 2013-08-23: 2 g via INTRAVENOUS
  Filled 2013-08-23: qty 50

## 2013-08-23 MED ORDER — POTASSIUM CHLORIDE 20 MEQ/15ML (10%) PO LIQD: 40.0000 meq | Freq: Once | ORAL | Status: DC

## 2013-08-23 MED ORDER — OXYCODONE HCL 5 MG/5ML PO SOLN
5.0000 mg | ORAL | Status: DC | PRN
  Administered 2013-08-23 – 2013-08-24 (×7): 10 mg
  Filled 2013-08-23 (×7): qty 10

## 2013-08-23 MED ORDER — POTASSIUM CHLORIDE 20 MEQ/15ML (10%) PO LIQD
40.0000 meq | Freq: Once | ORAL | Status: AC
  Administered 2013-08-23: 40 meq
  Filled 2013-08-23: qty 30

## 2013-08-23 NOTE — Progress Notes (Signed)
Wet to dry dressing changed to abd incision; wound open. Tol dressing change.

## 2013-08-23 NOTE — Progress Notes (Signed)
[place] TEAM 1 - Stepdown/ICU TEAM Progress Note  KIRANDEEP FARISS EXH:[id number] DOB: 01/01/1941 DOA: 07/29/2013 PCP: Laverna Peace, NP  Brief Narrative: 73 y.o. Female who presented with a 2 week history of increased abdominal pain with constipation and nausea but no vomiting.   Her history started on April 10 when she had lysis of adhesions and ovarian cystectomy for small bowel obstruction. Her family reported that she had a complication of anesthesia during the surgery. She returned to the hospital few days later with abdominal pain that was diagnosed as colitis and she was discharged home on Levaquin and Flagyl. Her pain improved slightly but she started having abdominal pain again and was admitted to [facility] and was continued on antibiotics with no need for surgical intervention. Gastroenterology followed the patient and did not recommend aggressive intervention so she was discharged home. She started having worsening of her abdominal pain over the previous 2 weeks associated with nausea but no vomiting. She reported severe constipation and had not had any bowel movement for 1 week. Patient had a colonoscopy one week prior to presentation but they could not cross beyond the splenic flexure according to the husband.   She presented to the ER on 710 because of the above symptoms. CT of the abdomen was done and showed obstruction at the level of the colon with small bowel distention. Surgery was consulted. She went to the OR for repair 7/13 and remained on the vent post-op.   HPI/Subjective: Alert,sitting up in chair-pain control variable but no SOB or CP  Assessment/Plan:  Colonic obstruction due to suspected colitis; s/p colectomy/colostomy  -per surgery  -issues with wound dehiscence post op so requiring abd binder (in place properly) -adjusted pain meds to longer acting per tube meds but was primarily given IV Fentanyl so will dc Fentanyl and increase Oxy dose to 5 or 10 mg  prn    Anasarca  -due to malnutrition and critical illness   Acute respiratory failure with hypoxia/Tracheostomy status  -etiology unclear but differential included ARDS, HCAP  -currently has pulmonary edema likely from 3rd spacing in setting of hypoalbunemia that is slowly improving -stable on TC  -PCCM downsized trach to #4 cuffless and recommend cont PMV training with goal toward plugging but also recommend to NOT decannulate  -given 1x dose of Lasix 8/2  Dehydration with hypernatremia  -Improved with increased free water continue water per tube to 350 ml QID   Protein-calorie malnutrition, severe w/ electrolyte imbalance/dysphagia  -SLP recommend D3 diet with thin liquids after MBSS -prn replete electrolytes    Hypokalemia -repleted - repeat labs  HTN (hypertension)  -prn Lopressor  DVT of axillary vein, acute right  -Heparin on hold due to recent issues with GIB sx's   Very small cerebral bleed  -tiny SAH vs SDH seen on CT head from 7/23 - completed due to altered mentation  -? When appropriate to resume any form of anticoagulation  Anemia  -baseline Hgb 11  -currently stable   CAD (coronary artery disease)  -Hold for now  Depression   Barrett's esophagus   Physical deconditioning  -? CIR-insurance payor to reevaluate -dc foley  Code Status: DNR Family Communication: Husband present  Disposition Plan: Stepdown  Consultants: Dr. Simonne Maffucci (PCCM.) Dr. Judeth Horn (surgery)  Procedure/Significant Events: 7/10 Admit, CT Abd - dilated prox colon w/ abrupt transition c/w with obstruction, diverticulitis vs colitis, Surgery c/s  7/12 Empiric Cipro/Flagyl, Cardiology consulted for pre-op evaluation, TPN per pharm  7/13 -  Laparotomy, partial colectomy, colostomy, SB resection, repair of enterotomy, lysis of adhesions  - Hgb down to 6.8 >> imp to 10.8 s/p 2u PRBC  7/14 - Extubated, required re-intubation due to hypoxemic resp failure, hypotension, concern  for aspiration  7/15 - 2D echo>>> severely dilated LA, normal systolic function  1/49 - Considered ARDS (unclear etiology, ?aspiration), CXR (persistent b/l edema), cont pressors, stable low PCT 1.9  7/17 - CT abd >no bleeding , no abscess, sm fluid in abd/pelvis  7/17 - Ven Dopp>+DVT Rt axillary vein  7/23 -CT Head (subcentimeter focal parenchymal hemorrhage - no SAH or subdural, no acute ischemic change)  - CT chest w/ contrast (severe multilobar pneumonia, moderate b/l pleural effusions, also favor chronicity  - CT Abd/Pelvis (IV, PO) (post-op changes, s/p L-hemicolectomy, transverse colon markedly thickened, unclear if related to acute inflammation/infection vs secondary distention/post-op edema)  7/24 - s/p bedside trach (DF) / bronchoscopy  7/25 - melena with dec Hgb  7/28 - tol ATC  8/2 PCXR;Cardiomegaly with mild interstitial edema.-Patchy bilateral lower lobe opacities, possibly atelectasis, left lower lobe not excluded.  -Small bilateral pleural effusions, Lt>Rt.  -Tracheostomy in satisfactory position.  Culture Blood x 2, 7/15 >> negative  Respiratory 7/15 >> few Candida  Repeat Blood x2, 7/23 >>>  BAL 7/24>>>normal flora  Antibiotics: Ceftriaxone 7/10 >>> 7/14  Flagyl 7/10 >>> 7/14  Vancomycin 7/14 >>> 7/16  Fluconazole 7/10 >>> 7/16  Zosyn 7/14 >> 7/21  Vancomycin 7/23 >>>off  Primaxin 7/23 >>>7/29  Micafungin 7/23 >>>7/24  DVT prophylaxis: SCD  Objective: VITAL SIGNS: Temp: 98.1 F (36.7 C) (08/04 0744) Temp src: Oral (08/04 0744) BP: 94/42 mmHg (08/04 0744) Pulse Rate: 101 (08/04 0744) SPO2; 100% on trach collar 5 L/minute;  FIO2: 28%   Intake/Output Summary (Last 24 hours) at 08/23/13 1127 Last data filed at 08/23/13 0749  Gross per 24 hour  Intake   2665 ml  Output    850 ml  Net   1815 ml   Exam: Gen: No acute respiratory distress  Chest:  Clear to auscultation  without any definitive rhonchi or wheezes, trach collar  Cardiac: Regular rate and  rhythm, S1-S2, no rubs murmurs or gallops, no peripheral edema, no JVD  Abdomen: Soft tender expected tenderness over surgical incision, nondistended without obvious hepatosplenomegaly, colostomy with yellow brown liquid stool in drainage bag, NG tube patent with tube feeding on hold in anticipation of trach change out Extremities: Symmetrical in appearance without cyanosis, clubbing or effusion  Data Reviewed: Basic Metabolic Panel:  Recent Labs Lab 08/17/13 0500  08/18/13 0500 08/19/13 0445 08/20/13 0501 08/21/13 0226 08/23/13 0500  NA 150*  < > 149* 151* 145 136* 135*  K 3.0*  < > 3.5* 3.8 3.6* 3.6* 3.0*  CL 109  < > 112 115* 111 102 97  CO2 30  < > 29 28 28 24 27   GLUCOSE 136*  < > 94 129* 131* 118* 143*  BUN 29*  < > 34* 35* 32* 21 18  CREATININE 0.41*  < > 0.35* 0.37* 0.33* 0.32* 0.34*  CALCIUM 7.8*  < > 8.1* 8.4 8.2* 7.9* 8.0*  MG 2.1  --   --   --   --  1.4*  --   PHOS  --   --  2.3  --   --   --   --   < > = values in this interval not displayed.  Liver Function Tests:  Recent Labs Lab 08/20/13 0501 08/21/13 0226  AST 18 24  ALT 40* 40*  ALKPHOS 81 82  BILITOT 0.5 0.5  PROT 4.8* 4.5*  ALBUMIN 2.1* 2.0*   No results found for this basename: LIPASE, AMYLASE,  in the last 168 hours No results found for this basename: AMMONIA,  in the last 168 hours  CBC:  Recent Labs Lab 08/17/13 0500 08/19/13 0445 08/23/13 0500  WBC 9.5 9.2 7.8  HGB 8.5* 8.8* 7.5*  HCT 27.9* 28.4* 23.1*  MCV 92.4 94.7 89.9  PLT 262 211 143*   Cardiac Enzymes: No results found for this basename: CKTOTAL, CKMB, CKMBINDEX, TROPONINI,  in the last 168 hours BNP (last 3 results)  Recent Labs  08/03/13 0930  PROBNP 3782.0*   CBG:  Recent Labs Lab 08/22/13 1607 08/22/13 2003 08/23/13 0012 08/23/13 0421 08/23/13 0745  GLUCAP 117* 146* 110* 146* 111*    No results found for this or any previous visit (from the past 240 hour(s)).   Studies:  Recent x-ray studies have been  reviewed in detail by the Attending Physician  Scheduled Meds:  Scheduled Meds: . antiseptic oral rinse  15 mL Mouth Rinse QID  . chlorhexidine  15 mL Mouth Rinse BID  . free water  350 mL Per Tube 4 times per day  . insulin aspart  0-20 Units Subcutaneous 6 times per day  . lip balm  1 application Topical BID  . pantoprazole sodium  40 mg Per Tube Q1200    Time spent on care of this patient: 35 mins   ELLIS,ALLISON L. , ANP  [facility] Office  [phone] Pager - [phone]  On-Call/Text Page:      [email or web address]      password [email or web address]  If 7PM-7AM, please contact night-coverage [email or web address] Password [email or web address] 08/23/2013, 11:27 AM   LOS: 25 days   Examined patient and discussed assessment and plan with ANP Ebony Hail and agree with the above Patient with multiple complex medical problems> 35 minutes spent on direct patient care

## 2013-08-23 NOTE — Progress Notes (Signed)
Occupational Therapy Treatment Patient Details Name: [name] MRN: [id number] DOB: December 30, 1940 Today's Date: 08/23/2013    History of present illness Pt admit with colonic obstruction.  SB resection with colostomy.  VDRF and trach.  08/16/13 abdominal wound beginning to dehice.  Abdominal binder ordered.    OT comments  Pt progressing to chair this session. Pt c/o pain at the back. After session, MD adjusting medicatons for pain management noted. Pt tolerated trach collar 28% on 6 L greater than 95%. OT to continue to follow acutely for adl retraining.   Follow Up Recommendations  CIR    Equipment Recommendations  None recommended by OT    Recommendations for Other Services      Precautions / Restrictions Precautions Precautions: Fall Precaution Comments: trach collar, abdominal binder, rectal pouch       Mobility Bed Mobility Overal bed mobility: Needs Assistance Bed Mobility: Supine to Sit;Rolling Rolling: Mod assist   Supine to sit: +2 for physical assistance;Mod assist;HOB elevated     General bed mobility comments: pad used to (A) with log roll . Pt requires (A) of for progression to EOB sitting due to pain  Transfers Overall transfer level: Needs assistance Equipment used: 2 person hand held assist Transfers: Sit to/from [facility] Sit to Stand: +2 physical assistance;Mod assist Stand pivot transfers: +2 physical assistance;Mod assist       General transfer comment: pad used to help with hip extension and static standing. pt initiated bil LE stepping to stand pivot. pt positioned in chair with pillows to help with pain at teh back    Balance Overall balance assessment: Needs assistance Sitting-balance support: Bilateral upper extremity supported;Feet supported Sitting balance-Leahy Scale: Fair Sitting balance - Comments: requires BIL UE support and posterior support due to pain. Pt able to progress to Min guard (A)   Standing  balance support: Bilateral upper extremity supported;During functional activity Standing balance-Leahy Scale: Poor                     ADL Overall ADL's : Needs assistance/impaired Eating/Feeding: NPO   Grooming: Wash/dry face;Minimal assistance;Sitting Grooming Details (indicate cue type and reason): wash face with cool wash cloth. Declined to comb hair Upper Body Bathing: Maximal assistance;Sitting   Lower Body Bathing: Total assistance;Sit to/from stand           Toilet Transfer: +2 for physical assistance;Stand-pivot;Moderate assistance             General ADL Comments: Pt supine on arrival and agreeable to OOB to chair. Per spouse pt has requested OOB just prior to arrival of therapist. pt reports pain in abdomen and back.pt educated on log roll to help with back pain management. pt allowed extended time on Rt side prior to side <>Sit.pt sitting EOB support by therapist . Pt demonstrates min guard sitting balance but c/o back pain      Vision                     Perception     Praxis      Cognition   Behavior During Therapy: Flat affect Overall Cognitive Status: Impaired/Different from baseline Area of Impairment: Attention   Current Attention Level: Sustained                 Extremity/Trunk Assessment               Exercises     Shoulder Instructions  General Comments      Pertinent Vitals/ Pain       Back pain- not rated. Reports comfort supported upright in chair  Home Living                                          Prior Functioning/Environment              Frequency Min 3X/week     Progress Toward Goals  OT Goals(current goals can now be found in the care plan section)  Progress towards OT goals: Progressing toward goals  Acute Rehab OT Goals Patient Stated Goal: to sit in chair OT Goal Formulation: With patient Time For Goal Achievement: 08/30/13 Potential to Achieve Goals:  Good ADL Goals Pt Will Perform Grooming: with min assist;sitting Pt Will Perform Upper Body Bathing: with mod assist;sitting Pt Will Transfer to Toilet: with mod assist;squat pivot transfer;bedside commode;stand pivot transfer Pt/caregiver will Perform Home Exercise Program: Increased strength;Right Upper extremity;Increased ROM;Independently;With written HEP provided Additional ADL Goal #1: Pt will actively participate in 25 mins therapeutic activity consistently to increase activity tolerance.  Additional ADL Goal #2: Pt will be able to sit EOB with min A x 15 mins in prep for BADLs  Plan Discharge plan remains appropriate    Co-evaluation    PT/OT/SLP Co-Evaluation/Treatment: Yes Reason for Co-Treatment: Complexity of the patient's impairments (multi-system involvement);For patient/therapist safety   OT goals addressed during session: ADL's and self-care      End of Session Equipment Utilized During Treatment: Gait belt   Activity Tolerance Patient tolerated treatment well   Patient Left in chair;with call bell/phone within reach;with family/visitor present   Nurse Communication Mobility status;Precautions        Time: 1610-9604 OT Time Calculation (min): 30 min  Charges: OT General Charges $OT Visit: 1 Procedure OT Treatments $Self Care/Home Management : 8-22 mins  Peri Maris 08/23/2013, 8:46 AM Pager: [phone]

## 2013-08-23 NOTE — Clinical Social Work Psychosocial (Signed)
Clinical Social Work Department BRIEF PSYCHOSOCIAL ASSESSMENT 08/23/2013  Patient:  [name], [name]     Account Number:  [id number]     Admit date:  07/29/2013  Clinical Social Worker:  Frederico Hamman  Date/Time:  08/23/2013 01:24 AM  Referred by:  Physician  Date Referred:  08/18/2013 Referred for  SNF Placement   Other Referral:   Interview type:   Other interview type:    PSYCHOSOCIAL DATA Living Status:  HUSBAND Admitted from facility:   Level of care:   Primary support name:  [name] Primary support relationship to patient:  SPOUSE Degree of support available:   Husband very supportive and was at the bedside.    CURRENT CONCERNS Current Concerns  Post-Acute Placement   Other Concerns:    SOCIAL WORK ASSESSMENT / PLAN CSW initially talked with husband by phone [phone]) regarding MD recommendation of ST rehab. Husband is in agreement and then gave phone to his wife (patient). Patient also in agreement with short-term rehab and talked with her about facilities in [facility] and where located. CSW also explained process and that she and husband will be informed of facilities that can take her so that a decision can be made.   Assessment/plan status:  Psychosocial Support/Ongoing Assessment of Needs Other assessment/ plan:   Information/referral to community resources:   Talked with patient about facilities in her area. Patient will also be provided with a list of skilled facilities in [facility].    PATIENT'S/FAMILY'S RESPONSE TO PLAN OF CARE: Husband and patient agreeable to speaking with CSW and are in agreement with ST rehab. CSW will follow-up with patient via room visit and provide SNF list.

## 2013-08-23 NOTE — Progress Notes (Signed)
PULMONARY / CRITICAL CARE MEDICINE  Name: TAVIANA WESTERGREN MRN: [id number] DOB: [birth year]    ADMISSION DATE: 07/29/2013  CONSULTATION DATE: 08/01/2013   REFERRING MD : Surgery  PRIMARY SERVICE: TRH  CHIEF COMPLAINT: Bowel obstruction   BRIEF PATIENT DESCRIPTION: 73 yo admitted 7/10 with bowel obstruction requiring surgery on 7/13. Course was complicated by HCAP, ARDS and prolonged respiratory failure requiring tracheostomy.  SIGNIFICANT EVENTS / STUDIES:  8/3  Trach tube downsized to cuffless # 4 8/4  Dysphagia 3 diet started, NG removed  LINES / TUBES:  Trach (DF) 7/24 >>>  R IJ CVL 7/14 >>>   INTERVAL HISTORY:  No respiratory issues overnight  VITAL SIGNS: Temp:  [97.4 F (36.3 C)-98.1 F (36.7 C)] 98.1 F (36.7 C) (08/04 0744) Pulse Rate:  [97-104] 101 (08/04 0744) Resp:  [18-28] 25 (08/04 0744) BP: (92-101)/(40-48) 94/42 mmHg (08/04 0744) SpO2:  [92 %-100 %] 92 % (08/04 0744) FiO2 (%):  [28 %] 28 % (08/04 0744) Weight:  [72.7 kg (160 lb 4.4 oz)] 72.7 kg (160 lb 4.4 oz) (08/04 0436)  PHYSICAL EXAMINATION: General:  No distress Neuro:  Sleepy but arouses to stinulation HEENT:  Trach site intact Cardiovascular:  Regular, no murmurs Lungs:  Bilateral diminished air entry, no added sounds Abdomen:  Soft Musculoskeletal:  Trace edema  LABS:  Recent Labs Lab 08/20/13 0501 08/21/13 0226 08/23/13 0500  NA 145 136* 135*  K 3.6* 3.6* 3.0*  CL 111 102 97  CO2 28 24 27   BUN 32* 21 18  CREATININE 0.33* 0.32* 0.34*  GLUCOSE 131* 118* 143*    Recent Labs Lab 08/17/13 0500 08/19/13 0445 08/23/13 0500  HGB 8.5* 8.8* 7.5*  HCT 27.9* 28.4* 23.1*  WBC 9.5 9.2 7.8  PLT 262 211 143*   IMAGING:  Dg Swallowing Func-speech Pathology  08/23/2013   Katherene Ponto Deblois, CCC-SLP     08/23/2013 10:37 AM Objective Swallowing Evaluation: Modified Barium Swallowing Study   Patient Details  Name: GLENNIS BORGER MRN: [id number] Date of Birth: [birth year]  Today's Date:  08/23/2013 Time: 0935-1000 SLP Time Calculation (min): 25 min  Past Medical History:  Past Medical History  Diagnosis Date  . Carotid artery disease     a. 60-70% bilat ICA stenosis by dopplers 05/2012.  Marland Kitchen Other and unspecified hyperlipidemia   . CAD (coronary artery disease)     a. Mod dz 2010 initially mgd medically. b. 12/2012 - angina s/p  PTCA/DES to mid-circumflex, PTCA/DES to first OM.   Marland Kitchen Hypertension   . Heart murmur   . Depression   . Barrett's esophagus   . Arthritis    Past Surgical History:  Past Surgical History  Procedure Laterality Date  . Appendectomy    . Cholecystectomy    . Colon resection    . 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 Rotator cuff repair Left   . Abdominal hysterectomy    . Colon surgery    . Foot surgery    . Hand surgery    . Laparotomy N/A 08/01/2013    Procedure: EXPLORATORY LAPAROTOMY;  Surgeon: Harl Bowie, MD;  Location: [place];  Service: General;  Laterality:  N/A;  . Partial colectomy N/A 08/01/2013    Procedure: PARTIAL COLECTOMY;  Surgeon: Harl Bowie, MD;   Location: [place];  Service: General;  Laterality: N/A;  . Colostomy N/A 08/01/2013  Procedure: COLOSTOMY;  Surgeon: Harl Bowie, MD;   Location: [place];  Service: General;  Laterality: N/A;  . Bowel resection N/A 08/01/2013    Procedure: SMALL BOWEL RESECTION;  Surgeon: Harl Bowie,  MD;  Location: [place];  Service: General;  Laterality: N/A;  . Tracheostomy      feinstein   HPI:  73 y/o female with PMH CAD, HTN, Barrett's esophagus, presented  to [facility] ED 7/10 with LLQ pain. Found to have bowel obstruction. To  OR for repair 7/13 (Laparotomy, partial colectomy, colostomy, SB  resection, repair of enterotomy, lysis of adhesions). Pt was  extubated on 7/14 but required reintubation until trach on 7/24.  Trach collar trials on 7/26. Pt also found to have a small ICH on  CT.      Assessment / Plan / Recommendation Clinical Impression  Dysphagia  Diagnosis: Within Functional Limits Clinical impression: Pt demonstrates normal swallow function,  though she did have intermittent coughing mid study unrelated to  any penetration or aspiration. NG tube was removed mid study to  ensure best swallow function. Given ongoing generalized weakness,  recommend soft dys 3 diet with thin liquids. Pt must wear PMSV  during PO intake with full supervision, though pt should attempt  to feed herself. SLP will f/u for tolerance and upgrade to  regular diet as pts endurance progresses. (Also confirmed with  surgery PA that pt is ready for regular diet).     Treatment Recommendation  Therapy as outlined in treatment plan below    Diet Recommendation Dysphagia 3 (Mechanical Soft);Thin liquid   Liquid Administration via: Cup;Straw Medication Administration: Whole meds with liquid Supervision: Patient able to self feed;Full supervision/cueing  for compensatory strategies Compensations: Slow rate;Small sips/bites Postural Changes and/or Swallow Maneuvers: Seated upright 90  degrees    Other  Recommendations Oral Care Recommendations: Oral care BID Other Recommendations: Place PMSV during PO intake   Follow Up Recommendations  Inpatient Rehab    Frequency and Duration min 2x/week  2 weeks   Pertinent Vitals/Pain NA    SLP Swallow Goals     General HPI: 73 y/o female with PMH CAD, HTN, Barrett's  esophagus, presented to [facility] ED 7/10 with LLQ pain. Found to have  bowel obstruction. To OR for repair 7/13 (Laparotomy, partial  colectomy, colostomy, SB resection, repair of enterotomy, lysis  of adhesions). Pt was extubated on 7/14 but required reintubation  until trach on 7/24. Trach collar trials on 7/26. Pt also found  to have a small ICH on CT.  Type of Study: Modified Barium Swallowing Study Reason for Referral: Objectively evaluate swallowing function Diet Prior to this Study: NPO;Panda Temperature Spikes Noted: No Respiratory Status: Trach collar Trach Size and Type: #4;Uncuffed;With  PMSV in place History of Recent Intubation: Yes Length of Intubations (days): 12 days Date extubated: 09/12/13 Behavior/Cognition: Alert;Cooperative;Pleasant mood Oral Cavity - Dentition: Dentures, top Oral Motor / Sensory Function: Within functional limits Self-Feeding Abilities: Able to feed self Patient Positioning: Upright in chair Baseline Vocal Quality: Low vocal intensity Volitional Cough: Weak Volitional Swallow: Able to elicit Anatomy: Within functional limits    Reason for Referral Objectively evaluate swallowing function   Oral Phase Oral Preparation/Oral Phase Oral Phase: WFL   Pharyngeal Phase Pharyngeal Phase Pharyngeal Phase: Within functional limits  Cervical Esophageal Phase    GO    Cervical Esophageal Phase Cervical Esophageal Phase: Solano DeBlois, MA CCC-SLP [phone]  DeBlois, Katherene Ponto 08/23/2013,  10:18 AM    ASSESSMENT / PLAN:  Acute respiratory failure HCAP ARDS, less likely pulmonary edema Tracheostomy status Dysphagia   Goal SpO2>92  Supplemental oxygen via trach collar   Keep cuffless #4 in, do not decannulate  PMV as tolerated  May consider capping tracheostomy if progresses well  Dysphagia 3 diet per SLP  Diuresis per primary team  Would not decannulate  Will follow intermittently  I have personally obtained history, examined patient, evaluated and interpreted laboratory and imaging results, reviewed medical records, formulated assessment / plan and placed orders.  Doree Fudge, MD Pulmonary and [place] Pager: [phone]  08/23/2013, 10:48 AM

## 2013-08-23 NOTE — Progress Notes (Signed)
To xray for swallow  eval.

## 2013-08-23 NOTE — Progress Notes (Signed)
Medicated for pain 

## 2013-08-23 NOTE — Progress Notes (Signed)
Physical Therapy Treatment Patient Details Name: [name] MRN: [id number] DOB: Apr 07, 1940 Today's Date: 08/23/2013    History of Present Illness Pt admit with colonic obstruction.  SB resection with colostomy.  VDRF and trach.  08/16/13 abdominal wound beginning to dehice.  Abdominal binder ordered.     PT Comments    Pt fatigues easily and c/o back pain.  Pt needs encouragement for participation.  Pt's O2 sats remained in 90's throughout session on Trach collar 6L at 28%.  Will continue to follow.    Follow Up Recommendations  SNF     Equipment Recommendations  None recommended by PT    Recommendations for Other Services       Precautions / Restrictions Precautions Precautions: Fall Precaution Comments: trach collar, abdominal binder, rectal pouch    Mobility  Bed Mobility Overal bed mobility: Needs Assistance Bed Mobility: Rolling;Sidelying to Sit Rolling: Mod assist Sidelying to sit: Mod assist;+2 for physical assistance;HOB elevated Supine to sit: +2 for physical assistance;Mod assist;HOB elevated     General bed mobility comments: pad used to (A) with log roll . Pt requires (A) of for progression to EOB sitting due to pain  Transfers Overall transfer level: Needs assistance Equipment used: 2 person hand held assist Transfers: Sit to/from Stand Sit to Stand: +2 physical assistance;Mod assist Stand pivot transfers: +2 physical assistance;Mod assist       General transfer comment: pad used to help with hip extension and static standing. pt initiated bil LE stepping to stand pivot. pt positioned in chair with pillows to help with pain at teh back  Ambulation/Gait                 Stairs            Wheelchair Mobility    Modified Rankin (Stroke Patients Only)       Balance Overall balance assessment: Needs assistance Sitting-balance support: Bilateral upper extremity supported;Feet supported Sitting balance-Leahy Scale: Poor Sitting  balance - Comments: requires BIL UE support and posterior support due to pain. Pt able to progress to Min guard (A)   Standing balance support: Bilateral upper extremity supported Standing balance-Leahy Scale: Poor                      Cognition Arousal/Alertness: Awake/alert Behavior During Therapy: Flat affect Overall Cognitive Status: Impaired/Different from baseline Area of Impairment: Attention   Current Attention Level: Sustained                Exercises      General Comments        Pertinent Vitals/Pain Back pain.  Premedicated.      Home Living                      Prior Function            PT Goals (current goals can now be found in the care plan section) Acute Rehab PT Goals Patient Stated Goal: to sit in chair PT Goal Formulation: Patient unable to participate in goal setting Time For Goal Achievement: 09/05/13 Potential to Achieve Goals: Fair Progress towards PT goals: Progressing toward goals    Frequency  Min 2X/week    PT Plan Current plan remains appropriate    Co-evaluation PT/OT/SLP Co-Evaluation/Treatment: Yes Reason for Co-Treatment: Complexity of the patient's impairments (multi-system involvement) PT goals addressed during session: Mobility/safety with mobility;Balance OT goals addressed during session: ADL's and self-care     End  of Session Equipment Utilized During Treatment: Gait belt;Oxygen Activity Tolerance: Patient limited by fatigue;Patient limited by lethargy Patient left: in chair;with call bell/phone within reach;with family/visitor present     Time: 0753-0820 PT Time Calculation (min): 27 min  Charges:  $Therapeutic Activity: 8-22 mins                    G CodesCatarina Hartshorn, [place] 08/23/2013, 10:10 AM

## 2013-08-23 NOTE — Clinical Social Work Placement (Addendum)
Clinical Social Work Department CLINICAL SOCIAL WORK PLACEMENT NOTE 08/23/2013  Patient:  [name], [name]  Account Number:  [id number] Admit date:  07/29/2013  Clinical Social Worker:  Shellene Sweigert Givens, LCSW  Date/time:  08/23/2013 01:32 AM  Clinical Social Work is seeking post-discharge placement for this patient at the following level of care:   SKILLED NURSING   (*CSW will update this form in Epic as items are completed)     Patient/family provided with [place] Department of Clinical Social Work's list of facilities offering this level of care within the geographic area requested by the patient (or if unable, by the patient's family).  08/23/2013  Patient/family informed of their freedom to choose among providers that offer the needed level of care, that participate in Medicare, Medicaid or managed care program needed by the patient, have an available bed and are willing to accept the patient.    Patient/family informed of [facility]' ownership interest in [facility], as well as of the fact that they are under no obligation to receive care at this facility.  PASARR submitted to EDS on 05/02/13 PASARR number received on 05/02/13  FL2 transmitted to all facilities in geographic area requested by pt/family on  08/23/2013 FL2 transmitted to all facilities within larger geographic area on   Patient informed that his/her managed care company has contracts with or will negotiate with  certain facilities, including the following:  [employer] [facility] - contact [place] [facility] [phone], [phone]).   Patient/family informed of bed offers received: 08/24/13  Patient chooses bed at [facility] Physician recommends and patient chooses bed at    Patient to be transferred to on   Patient to be transferred to facility by  Patient and family notified of transfer on  Name of family member notified:    The following physician request were entered in Epic:  Additional  Comments: 08/24/13 - CSW contacted Kirke Shaggy [phone]) dmissions director with [facility]. They will make a bed offer and will submit clinicals to [facility] when patient ready for d/c. Lorrie advised that patient is [facility].

## 2013-08-23 NOTE — Progress Notes (Signed)
[place] TEAM 1 - Stepdown/ICU TEAM Progress Note  [name] DGU:[id number] DOB: November 27, 1940 DOA: 07/29/2013 PCP: Laverna Peace, NP  Admit HPI / Brief Narrative: 73 y.o. WF PMHx Depression, HTN, HLD, coronary artery disease, chronic pain syndrome. Present with 2 week history of increased abdominal pain with constipation and nausea but no vomiting. Her history started on April 10 when she had lysis of adhesions and ovarian cystectomy for small bowel obstruction. Her family reported that she had a complication of anesthesia during the surgery. She returned to the hospital few days later with abdominal pain that was diagnosed as colitis and she was discharged home on Levaquin and Flagyl. Her pain improved slightly but she started having abdominal pain again and was admitted to [facility] and was continued on antibiotics with no need for surgical intervention. Gastroenterology followed the patient and did not recommend aggressive intervention so she was discharged home. She started having worsening of her abdominal pain over the previous 2 weeks associated with nausea but no vomiting. She reported severe constipation and had not had any bowel movement for 1 week. Patient had a colonoscopy one week prior to presentation but they could not cross beyond the splenic flexure according to the husband.  She presented to the ER on 710 because of the above symptoms. CT of the abdomen was done and showed obstruction at the level of the colon with small bowel distention. Surgery was consulted. She went to the OR for repair 7/13 and remained on the vent post-op.   HPI/Subjective: 8/4 patient currently talking today after having trach downsize yesterday. States negative CP/SOB  .  Assessment/Plan:  Colonic obstruction due to suspected colitis; s/p colectomy/colostomy  -per surgery  -issues with wound dehiscence post op so requiring abd binder (in place properly) -Continues to improve, past swallow  study on 8/4 , patient start Dysphagia 3 (Mechanical Soft);Thin liquid    Anasarca  -due to malnutrition and critical illness   Acute respiratory failure with hypoxia/Tracheostomy status  -etiology unclear but differential included ARDS, HCAP  -currently has pulmonary edema likely from 3rd spacing in setting of hypoalbunemia  -stable on TC  -PCCM aware of need to downsize trach. Counseled her husband that they will reassess downsizing on Monday  - 8/4 counseled patient's husband at her tracheostomy would not be removed within the next few days. Husband upset that she may have to go to a SNF which is not convenient to their residence.  Dehydration with hypernatremia  -Resolved continue to monitor   Protein-calorie malnutrition, severe w/ electrolyte imbalance  -  8/4 past MBS; start Dysphagia 3 (Mechanical Soft);Thin liquid  -Continue to monitor electrolytes closely     Hypokalemia -Potassium 10 mEq x2 -Recheck potassium and magnesium in a.m.  Hypomagnesemia -Magnesium IV 2 gm x1 -Recheck in a.m.  HTN (hypertension)  -controlled off meds ; has not required PRN metoprolol  Dilated cardiomyopathy -Patient with severely dilated left atrium, normal EF see echocardiogram below  DVT of axillary vein, acute right  -Heparin on hold due to recent issues with GIB sx's   Very small cerebral bleed  -tiny SAH vs SDH seen on CT head from 7/23 - completed due to altered mentation   Anemia  -baseline Hgb 11  -currently stable   CAD (coronary artery disease)  -Hold for now  Depression   Barrett's esophagus   Physical deconditioning  -Insurance has denied request for CIR x2, CSW/NCM looking for SNF -NOTE husband upset about the situation, but counseled that  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   Code Status: DNR Family Communication: Husband present  Disposition Plan: Per surgery    Consultants: Dr. Simonne Maffucci (PCCM.) Dr. Judeth Horn (surgery)   Procedure/Significant  Events: 7/10 Admit, CT Abd - dilated prox colon w/ abrupt transition c/w with obstruction, diverticulitis vs colitis, Surgery c/s  7/12 Empiric Cipro/Flagyl, Cardiology consulted for pre-op evaluation, TPN per pharm  7/13 - Laparotomy, partial colectomy, colostomy, SB resection, repair of enterotomy, lysis of adhesions  - Hgb down to 6.8 >> imp to 10.8 s/p 2u PRBC  7/14 - Extubated, required re-intubation due to hypoxemic resp failure, hypotension, concern for aspiration  7/15 - 2D echo>>> severely dilated LA, normal systolic function  4/85 - Considered ARDS (unclear etiology, ?aspiration), CXR (persistent b/l edema), cont pressors, stable low PCT 1.9  7/17 - CT abd >no bleeding , no abscess, sm fluid in abd/pelvis  7/17 - Ven Dopp>+DVT Rt axillary vein  7/23 -CT Head (subcentimeter focal parenchymal hemorrhage - no SAH or subdural, no acute ischemic change)  - CT chest w/ contrast (severe multilobar pneumonia, moderate b/l pleural effusions, also favor chronicity  - CT Abd/Pelvis (IV, PO) (post-op changes, s/p L-hemicolectomy, transverse colon markedly thickened, unclear if related to acute inflammation/infection vs secondary distention/post-op edema)  7/24 - s/p bedside trach (DF) / bronchoscopy  7/25 - melena with dec Hgb  7/28 - tol ATC  8/2 PCXR;Cardiomegaly with mild interstitial edema.-Patchy bilateral lower lobe opacities, possibly atelectasis, left lower lobe not excluded.  -Small bilateral pleural effusions, Lt>Rt.  -Tracheostomy in satisfactory position.    Culture Blood x 2, 7/15 >> negative  Respiratory 7/15 >> few Candida  Repeat Blood x2, 7/23 >>>  BAL 7/24>>>normal flora    Antibiotics: Ceftriaxone 7/10 >>> 7/14  Flagyl 7/10 >>> 7/14  Vancomycin 7/14 >>> 7/16  Fluconazole 7/10 >>> 7/16  Zosyn 7/14 >> 7/21  Vancomycin 7/23 >>>off  Primaxin 7/23 >>>7/29  Micafungin 7/23 >>>7/24   DVT prophylaxis: SCD   Devices NA   LINES / TUBES:  7/14 triple-lumen CVC  right IJ     Continuous Infusions: . feeding supplement (VITAL AF 1.2 CAL) 1,000 mL (08/22/13 1200)    Objective: VITAL SIGNS: Temp: 98.1 F (36.7 C) (08/04 0744) Temp src: Oral (08/04 0744) BP: 94/42 mmHg (08/04 0744) Pulse Rate: 101 (08/04 0744) SPO2; 100% on trach collar 5 L/minute;  FIO2: 28%   Intake/Output Summary (Last 24 hours) at 08/23/13 4627 Last data filed at 08/23/13 0350  Gross per 24 hour  Intake   2665 ml  Output    850 ml  Net   1815 ml     Exam: Gen: A./O x4., follows all commands, patient's trach was downsized as the day and patient able to communicate, indicates continued nausea,No acute respiratory distress  Chest:  Diffuse Coarse BS to auscultation  without any definitive rhonchi or wheezes, trach collar  Cardiac: Regular rate and rhythm, S1-S2, no rubs murmurs or gallops, no peripheral edema, no JVD  Abdomen: Soft tender diffusely over surgical incision nondistended without obvious hepatosplenomegaly, abdomen shows partial dehiscence with wet to dry @; surgical incision site, negative sign of infection no ascites, colostomy with yellow brown liquid stool in drainage bag, Extremities: Symmetrical in appearance without cyanosis, clubbing or effusion  Data Reviewed: Basic Metabolic Panel:  Recent Labs Lab 08/17/13 0500  08/18/13 0500 08/19/13 0445 08/20/13 0501 08/21/13 0226 08/23/13 0500  NA 150*  < > 149* 151* 145 136* 135*  K 3.0*  < >  3.5* 3.8 3.6* 3.6* 3.0*  CL 109  < > 112 115* 111 102 97  CO2 30  < > 29 28 28 24 27   GLUCOSE 136*  < > 94 129* 131* 118* 143*  BUN 29*  < > 34* 35* 32* 21 18  CREATININE 0.41*  < > 0.35* 0.37* 0.33* 0.32* 0.34*  CALCIUM 7.8*  < > 8.1* 8.4 8.2* 7.9* 8.0*  MG 2.1  --   --   --   --  1.4*  --   PHOS  --   --  2.3  --   --   --   --   < > = values in this interval not displayed. Liver Function Tests:  Recent Labs Lab 08/20/13 0501 08/21/13 0226  AST 18 24  ALT 40* 40*  ALKPHOS 81 82  BILITOT 0.5 0.5   PROT 4.8* 4.5*  ALBUMIN 2.1* 2.0*   No results found for this basename: LIPASE, AMYLASE,  in the last 168 hours No results found for this basename: AMMONIA,  in the last 168 hours CBC:  Recent Labs Lab 08/17/13 0500 08/19/13 0445 08/23/13 0500  WBC 9.5 9.2 7.8  HGB 8.5* 8.8* 7.5*  HCT 27.9* 28.4* 23.1*  MCV 92.4 94.7 89.9  PLT 262 211 143*   Cardiac Enzymes: No results found for this basename: CKTOTAL, CKMB, CKMBINDEX, TROPONINI,  in the last 168 hours BNP (last 3 results)  Recent Labs  08/03/13 0930  PROBNP 3782.0*   CBG:  Recent Labs Lab 08/22/13 1607 08/22/13 2003 08/23/13 0012 08/23/13 0421 08/23/13 0745  GLUCAP 117* 146* 110* 146* 111*    No results found for this or any previous visit (from the past 240 hour(s)).   Studies:  Recent x-ray studies have been reviewed in detail by the Attending Physician  Scheduled Meds:  Scheduled Meds: . antiseptic oral rinse  15 mL Mouth Rinse QID  . chlorhexidine  15 mL Mouth Rinse BID  . free water  350 mL Per Tube 4 times per day  . insulin aspart  0-20 Units Subcutaneous 6 times per day  . lip balm  1 application Topical BID  . pantoprazole sodium  40 mg Per Tube Q1200    Time spent on care of this patient: 40 mins   Allie Bossier , MD   [facility] Office  [phone] Pager - [phone]  On-Call/Text Page:      [email or web address]      password [email or web address]  If 7PM-7AM, please contact night-coverage [email or web address] Password [email or web address] 08/23/2013, 9:38 AM   LOS: 25 days

## 2013-08-23 NOTE — Procedures (Signed)
Objective Swallowing Evaluation: Modified Barium Swallowing Study  Patient Details  Name: JOHANY HANSMAN MRN: [id number] Date of Birth: Dec 23, 1940  Today's Date: 08/23/2013 Time: 0935-1000 SLP Time Calculation (min): 25 min  Past Medical History:  Past Medical History  Diagnosis Date  . Carotid artery disease     a. 60-70% bilat ICA stenosis by dopplers 05/2012.  Marland Kitchen Other and unspecified hyperlipidemia   . CAD (coronary artery disease)     a. Mod dz 2010 initially mgd medically. b. 12/2012 - angina s/p PTCA/DES to mid-circumflex, PTCA/DES to first OM.   Marland Kitchen Hypertension   . Heart murmur   . Depression   . Barrett's esophagus   . Arthritis    Past Surgical History:  Past Surgical History  Procedure Laterality Date  . Appendectomy    . Cholecystectomy    . Colon resection    . 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 Rotator cuff repair Left   . Abdominal hysterectomy    . Colon surgery    . Foot surgery    . Hand surgery    . Laparotomy N/A 08/01/2013    Procedure: EXPLORATORY LAPAROTOMY;  Surgeon: Harl Bowie, MD;  Location: [place];  Service: General;  Laterality: N/A;  . Partial colectomy N/A 08/01/2013    Procedure: PARTIAL COLECTOMY;  Surgeon: Harl Bowie, MD;  Location: [place];  Service: General;  Laterality: N/A;  . Colostomy N/A 08/01/2013    Procedure: COLOSTOMY;  Surgeon: Harl Bowie, MD;  Location: [place];  Service: General;  Laterality: N/A;  . Bowel resection N/A 08/01/2013    Procedure: SMALL BOWEL RESECTION;  Surgeon: Harl Bowie, MD;  Location: [place];  Service: General;  Laterality: N/A;  . Tracheostomy      feinstein   HPI:  73 y/o female with PMH CAD, HTN, Barrett's esophagus, presented to [facility] ED 7/10 with LLQ pain. Found to have bowel obstruction. To OR for repair 7/13 (Laparotomy, partial colectomy, colostomy, SB resection, repair of enterotomy, lysis of adhesions). Pt was extubated on  7/14 but required reintubation until trach on 7/24. Trach collar trials on 7/26. Pt also found to have a small ICH on CT.      Assessment / Plan / Recommendation Clinical Impression  Dysphagia Diagnosis: Within Functional Limits Clinical impression: Pt demonstrates normal swallow function, though she did have intermittent coughing mid study unrelated to any penetration or aspiration. NG tube was removed mid study to ensure best swallow function. Given ongoing generalized weakness, recommend soft dys 3 diet with thin liquids. Pt must wear PMSV during PO intake with full supervision, though pt should attempt to feed herself. SLP will f/u for tolerance and upgrade to regular diet as pts endurance progresses. (Also confirmed with surgery PA that pt is ready for regular diet).     Treatment Recommendation  Therapy as outlined in treatment plan below    Diet Recommendation Dysphagia 3 (Mechanical Soft);Thin liquid   Liquid Administration via: Cup;Straw Medication Administration: Whole meds with liquid Supervision: Patient able to self feed;Full supervision/cueing for compensatory strategies Compensations: Slow rate;Small sips/bites Postural Changes and/or Swallow Maneuvers: Seated upright 90 degrees    Other  Recommendations Oral Care Recommendations: Oral care BID Other Recommendations: Place PMSV during PO intake   Follow Up Recommendations  Inpatient Rehab    Frequency and Duration min 2x/week  2 weeks   Pertinent  Vitals/Pain NA    SLP Swallow Goals     General HPI: 73 y/o female with PMH CAD, HTN, Barrett's esophagus, presented to [facility] ED 7/10 with LLQ pain. Found to have bowel obstruction. To OR for repair 7/13 (Laparotomy, partial colectomy, colostomy, SB resection, repair of enterotomy, lysis of adhesions). Pt was extubated on 7/14 but required reintubation until trach on 7/24. Trach collar trials on 7/26. Pt also found to have a small ICH on CT.  Type of Study: Modified Barium  Swallowing Study Reason for Referral: Objectively evaluate swallowing function Diet Prior to this Study: NPO;Panda Temperature Spikes Noted: No Respiratory Status: Trach collar Trach Size and Type: #4;Uncuffed;With PMSV in place History of Recent Intubation: Yes Length of Intubations (days): 12 days Date extubated: 09/12/13 Behavior/Cognition: Alert;Cooperative;Pleasant mood Oral Cavity - Dentition: Dentures, top Oral Motor / Sensory Function: Within functional limits Self-Feeding Abilities: Able to feed self Patient Positioning: Upright in chair Baseline Vocal Quality: Low vocal intensity Volitional Cough: Weak Volitional Swallow: Able to elicit Anatomy: Within functional limits    Reason for Referral Objectively evaluate swallowing function   Oral Phase Oral Preparation/Oral Phase Oral Phase: WFL   Pharyngeal Phase Pharyngeal Phase Pharyngeal Phase: Within functional limits  Cervical Esophageal Phase    GO    Cervical Esophageal Phase Cervical Esophageal Phase: [facility]        Herbie Baltimore, MA CCC-SLP [phone]  Burtis Imhoff, Katherene Ponto 08/23/2013, 10:18 AM

## 2013-08-24 LAB — GLUCOSE, CAPILLARY
GLUCOSE-CAPILLARY: 103 mg/dL — AB (ref 70–99)
GLUCOSE-CAPILLARY: 107 mg/dL — AB (ref 70–99)
GLUCOSE-CAPILLARY: 108 mg/dL — AB (ref 70–99)
GLUCOSE-CAPILLARY: 90 mg/dL (ref 70–99)
GLUCOSE-CAPILLARY: 92 mg/dL (ref 70–99)
Glucose-Capillary: 97 mg/dL (ref 70–99)
Glucose-Capillary: 124 mg/dL — ABNORMAL HIGH (ref 70–99)

## 2013-08-24 LAB — URINALYSIS, ROUTINE W REFLEX MICROSCOPIC
Bilirubin Urine: NEGATIVE
GLUCOSE, UA: NEGATIVE mg/dL
Hgb urine dipstick: NEGATIVE
KETONES UR: NEGATIVE mg/dL
Leukocytes, UA: NEGATIVE
Nitrite: NEGATIVE
PH: 6.5 (ref 5.0–8.0)
Protein, ur: NEGATIVE mg/dL
Specific Gravity, Urine: 1.012 (ref 1.005–1.030)
Urobilinogen, UA: 0.2 mg/dL (ref 0.0–1.0)

## 2013-08-24 LAB — COMPREHENSIVE METABOLIC PANEL
ALT: 39 U/L — ABNORMAL HIGH (ref 0–35)
AST: 24 U/L (ref 0–37)
Albumin: 2 g/dL — ABNORMAL LOW (ref 3.5–5.2)
Alkaline Phosphatase: 116 U/L (ref 39–117)
Anion gap: 10 (ref 5–15)
BUN: 11 mg/dL (ref 6–23)
CALCIUM: 8.3 mg/dL — AB (ref 8.4–10.5)
CO2: 28 meq/L (ref 19–32)
Chloride: 105 mEq/L (ref 96–112)
Creatinine, Ser: 0.44 mg/dL — ABNORMAL LOW (ref 0.50–1.10)
GFR calc Af Amer: >90 mL/min (ref >90)
Glucose, Bld: 91 mg/dL (ref 70–99)
Potassium: 3.5 mEq/L — ABNORMAL LOW (ref 3.7–5.3)
SODIUM: 143 meq/L (ref 137–147)
Total Bilirubin: 0.3 mg/dL (ref 0.3–1.2)
Total Protein: 4.9 g/dL — ABNORMAL LOW (ref 6.0–8.3)

## 2013-08-24 LAB — CLOSTRIDIUM DIFFICILE BY PCR: CDIFFPCR: NEGATIVE

## 2013-08-24 MED ORDER — POTASSIUM CHLORIDE CRYS ER 20 MEQ PO TBCR
40.0000 meq | EXTENDED_RELEASE_TABLET | Freq: Once | ORAL | Status: AC
  Administered 2013-08-24: 40 meq via ORAL
  Filled 2013-08-24: qty 2

## 2013-08-24 MED ORDER — ACETAMINOPHEN 325 MG PO TABS: 650.0000 mg | ORAL_TABLET | Freq: Four times a day (QID) | ORAL | Status: DC | PRN

## 2013-08-24 MED ORDER — PANTOPRAZOLE SODIUM 40 MG PO TBEC
40.0000 mg | DELAYED_RELEASE_TABLET | Freq: Every day | ORAL | Status: DC
  Administered 2013-08-24 – 2013-08-26 (×3): 40 mg via ORAL
  Filled 2013-08-24 (×3): qty 1

## 2013-08-24 MED ORDER — OXYCODONE HCL 5 MG/5ML PO SOLN
5.0000 mg | ORAL | Status: DC | PRN
  Administered 2013-08-24 – 2013-08-26 (×7): 10 mg via ORAL
  Filled 2013-08-24 (×7): qty 10

## 2013-08-24 MED ORDER — DIPHENHYDRAMINE HCL 25 MG PO CAPS: 25.0000 mg | ORAL_CAPSULE | Freq: Four times a day (QID) | ORAL | Status: DC | PRN

## 2013-08-24 NOTE — Progress Notes (Signed)
[place] TEAM 1 - Stepdown/ICU TEAM Progress Note  [name] FAO:[id number] DOB: 06-22-1940 DOA: 07/29/2013 PCP: Laverna Peace, NP  Brief Narrative: 73 y.o. female who presented with a 2 week history of increased abdominal pain with constipation and nausea but no vomiting.   Her history started on April 10 when she had lysis of adhesions and ovarian cystectomy for small bowel obstruction. Her family reported that she had a complication of anesthesia during the surgery. She returned to the hospital few days later with abdominal pain that was diagnosed as colitis and she was discharged home on Levaquin and Flagyl. Her pain improved slightly but she started having abdominal pain again and was admitted to [facility] and was continued on antibiotics with no need for surgical intervention. Gastroenterology followed the patient and did not recommend aggressive intervention so she was discharged home. She started having worsening of her abdominal pain over the previous 2 weeks associated with nausea but no vomiting. She reported severe constipation and had not had any bowel movement for 1 week. Patient had a colonoscopy one week prior to presentation but they could not cross beyond the splenic flexure according to the husband.   She presented to the ER on 710 because of the above symptoms. CT of the abdomen was done and showed obstruction at the level of the colon with small bowel distention. Surgery was consulted. She went to the OR for repair 7/13 and remained on the vent post-op.   HPI/Subjective: Sitting up in bed c/o suprapubic pain-no SOB-able to phonate around trach without PMV in place  Assessment/Plan:  Colonic obstruction due to suspected colitis; s/p partial colectomy/colostomy  -per surgery  -issues with wound dehiscence post op so requiring abd binder (in place properly) -still having liquid stool from ostomy so as precaution check C Diff PCR -per surgery team stable from  surgical standpoint   Urinary retention -Foley dc'd 8/4 but since has required I/O cath for retained urine > 600 so will reinsert foley and ck UA/cx   Anasarca  -due to malnutrition and critical illness   Acute respiratory failure with hypoxia/Tracheostomy status  -etiology unclear but differential included ARDS, HCAP  -currently has pulmonary edema likely from 3rd spacing in setting of hypoalbunemia that is slowly improving -stable on TC  -PCCM downsized trach to #4 cuffless and 8/5 have plugged trach- if tolerates plans to de-cannulate in am -given 1x dose of Lasix on 8/2  Dehydration with hypernatremia  -resolved  Protein-calorie malnutrition, severe /dysphagia  -8/4 SLP recommended D3 diet with thin liquids after MBS-has tolerated well   Hypokalemia -repleted - repeat labs  HTN (hypertension)  -prn Lopressor  DVT of axillary vein, acute right  -Heparin on hold due to recent issues with GIB sx's   Very small cerebral bleed  -tiny SAH vs SDH seen on CT head from 7/23 - completed due to altered mentation   Anemia  -baseline Hgb 11  -currently stable   CAD (coronary artery disease)   Depression   Barrett's esophagus   Physical deconditioning  -? CIR-insurance payor to reevaluate -dc foley  Code Status: DNR Family Communication: brother present  Disposition Plan: Stepdown until decannulated  Consultants: Dr. Simonne Maffucci (PCCM.) Dr. Judeth Horn (surgery)  Procedure/Significant Events: 7/10 Admit, CT Abd - dilated prox colon w/ abrupt transition c/w with obstruction, diverticulitis vs colitis, Surgery c/s  7/12 Empiric Cipro/Flagyl, Cardiology consulted for pre-op evaluation, TPN per pharm  7/13 - Laparotomy, partial colectomy, colostomy, SB resection, repair of  enterotomy, lysis of adhesions  - Hgb down to 6.8 >> imp to 10.8 s/p 2u PRBC  7/14 - Extubated, required re-intubation due to hypoxemic resp failure, hypotension, concern for aspiration  7/15 - 2D  echo>>> severely dilated LA, normal systolic function  4/09 - Considered ARDS (unclear etiology, ?aspiration), CXR (persistent b/l edema), cont pressors, stable low PCT 1.9  7/17 - CT abd >no bleeding , no abscess, sm fluid in abd/pelvis  7/17 - Ven Dopp>+DVT Rt axillary vein  7/23 -CT Head (subcentimeter focal parenchymal hemorrhage - no SAH or subdural, no acute ischemic change)  - CT chest w/ contrast (severe multilobar pneumonia, moderate b/l pleural effusions, also favor chronicity  - CT Abd/Pelvis (IV, PO) (post-op changes, s/p L-hemicolectomy, transverse colon markedly thickened, unclear if related to acute inflammation/infection vs secondary distention/post-op edema)  7/24 - s/p bedside trach (DF) / bronchoscopy  7/25 - melena with dec Hgb  7/28 - tol ATC  8/2 PCXR;Cardiomegaly with mild interstitial edema.-Patchy bilateral lower lobe opacities, possibly atelectasis, left lower lobe not excluded.  -Small bilateral pleural effusions, Lt>Rt.  -Tracheostomy in satisfactory position.  Antibiotics: Ceftriaxone 7/10 >>> 7/14  Flagyl 7/10 >>> 7/14  Vancomycin 7/14 >>> 7/16  Fluconazole 7/10 >>> 7/16  Zosyn 7/14 >> 7/21  Vancomycin 7/23 >>>off  Primaxin 7/23 >>>7/29  Micafungin 7/23 >>>7/24  DVT prophylaxis: SCD  Objective: VITAL SIGNS: Temp: 98 F (36.7 C) (08/05 1216) Temp src: Oral (08/05 1216) BP: 99/55 mmHg (08/05 1216) Pulse Rate: 101 (08/05 1216) SPO2; 100% on trach collar 5 L/minute;  FIO2: 28%  Intake/Output Summary (Last 24 hours) at 08/24/13 1225 Last data filed at 08/24/13 0930  Gross per 24 hour  Intake    720 ml  Output   1200 ml  Net   -480 ml   Exam: Gen: No acute respiratory distress  Chest:  Clear to auscultation without rhonchi or wheezes, trach collar  Cardiac: Regular rate and rhythm, no rubs murmurs or gallops, no peripheral edema Abdomen: Soft tender expected tenderness over surgical incision, nondistended without obvious hepatosplenomegaly,  colostomy with yellow brown liquid stool in drainage bag Extremities: Symmetrical in appearance without cyanosis, clubbing or effusion  Data Reviewed: Basic Metabolic Panel:  Recent Labs Lab 08/18/13 0500 08/19/13 0445 08/20/13 0501 08/21/13 0226 08/23/13 0500 08/23/13 1000 08/24/13 0438  NA 149* 151* 145 136* 135*  --  143  K 3.5* 3.8 3.6* 3.6* 3.0* 4.1 3.5*  CL 112 115* 111 102 97  --  105  CO2 29 28 28 24 27   --  28  GLUCOSE 94 129* 131* 118* 143*  --  91  BUN 34* 35* 32* 21 18  --  11  CREATININE 0.35* 0.37* 0.33* 0.32* 0.34*  --  0.44*  CALCIUM 8.1* 8.4 8.2* 7.9* 8.0*  --  8.3*  MG  --   --   --  1.4*  --  1.5  --   PHOS 2.3  --   --   --   --   --   --    Liver Function Tests:  Recent Labs Lab 08/20/13 0501 08/21/13 0226 08/24/13 0438  AST 18 24 24   ALT 40* 40* 39*  ALKPHOS 81 82 116  BILITOT 0.5 0.5 0.3  PROT 4.8* 4.5* 4.9*  ALBUMIN 2.1* 2.0* 2.0*   No results found for this basename: LIPASE, AMYLASE,  in the last 168 hours No results found for this basename: AMMONIA,  in the last 168 hours  CBC:  Recent Labs Lab 08/19/13 0445 08/23/13 0500  WBC 9.2 7.8  HGB 8.8* 7.5*  HCT 28.4* 23.1*  MCV 94.7 89.9  PLT 211 143*   CBG:  Recent Labs Lab 08/23/13 1540 08/23/13 2016 08/24/13 0003 08/24/13 0408 08/24/13 0802  GLUCAP 142* 78 90 97 92    No results found for this or any previous visit (from the past 240 hour(s)).   Studies:  Recent x-ray studies have been reviewed in detail by the Attending Physician  Scheduled Meds:  Scheduled Meds: . antiseptic oral rinse  15 mL Mouth Rinse QID  . chlorhexidine  15 mL Mouth Rinse BID  . free water  350 mL Per Tube 4 times per day  . insulin aspart  0-20 Units Subcutaneous 6 times per day  . lip balm  1 application Topical BID  . pantoprazole sodium  40 mg Per Tube Q1200    Time spent on care of this patient: 35 mins  ELLIS,ALLISON L. , ANP  [facility] Office  [phone] Pager -  [phone]  On-Call/Text Page:      [email or web address]      password [email or web address]  If 7PM-7AM, please contact night-coverage [email or web address] Password [email or web address] 08/24/2013, 12:25 PM   LOS: 26 days   I have personally examined this patient and reviewed the entire database. I have reviewed the above note, made any necessary editorial changes, and agree with its content.  Cherene Altes, MD [facility]

## 2013-08-24 NOTE — Progress Notes (Signed)
Doing well Passed swallow  Abd wound stable. No evisceration  If pt is taking adequate po would be ok for discharge  [name]. Redmond Pulling, MD, FACS General, Bariatric, & Minimally Invasive Surgery [facility], [place]

## 2013-08-24 NOTE — Progress Notes (Signed)
[facility] Progress Note  23 Days Post-Op  Subjective: Pt's pain well controlled, no more nausea or vomiting.  Midline wound tender.  Husband at bedside.  She passed her swallow and is now on D3 diet.  She's tolerating this well.  Having BM's in ostomy and flatus.  Nurses have cut a hole in her abdominal binder for her ostomy to fit in to.  Objective: Vital signs in last 24 hours: Temp:  [97.9 F (36.6 C)-98.6 F (37 C)] 98 F (36.7 C) (08/05 0420) Pulse Rate:  [90-102] 90 (08/05 0420) Resp:  [17-36] 17 (08/05 0420) BP: (86-104)/(40-73) 104/44 mmHg (08/05 0420) SpO2:  [93 %-100 %] 97 % (08/05 0420) FiO2 (%):  [28 %] 28 % (08/05 0420) Weight:  [158 lb 11.7 oz (72 kg)] 158 lb 11.7 oz (72 kg) (08/05 0420) Last BM Date: 08/23/13  Intake/Output from previous day: 08/04 0701 - 08/05 0700 In: 990 [P.O.:600; NG/GT:350] Out: 1400 [Urine:1400] Intake/Output this shift:    PE: Gen: Alert, NAD, pleasant  Abd: Soft, mild tenderness to palpation over midline wound, +BS, no HSM, midline wound with some granulation tissue, but some light brown/tan slough with evident wound dehiscence and mild separation, wound depth appears to be getting more shallow   Lab Results:   Recent Labs  08/23/13 0500  WBC 7.8  HGB 7.5*  HCT 23.1*  PLT 143*   BMET  Recent Labs  08/23/13 0500 08/23/13 1000 08/24/13 0438  NA 135*  --  143  K 3.0* 4.1 3.5*  CL 97  --  105  CO2 27  --  28  GLUCOSE 143*  --  91  BUN 18  --  11  CREATININE 0.34*  --  0.44*  CALCIUM 8.0*  --  8.3*   PT/INR No results found for this basename: LABPROT, INR,  in the last 72 hours CMP     Component Value Date/Time   NA 143 08/24/2013 0438   K 3.5* 08/24/2013 0438   CL 105 08/24/2013 0438   CO2 28 08/24/2013 0438   GLUCOSE 91 08/24/2013 0438   BUN 11 08/24/2013 0438   CREATININE 0.44* 08/24/2013 0438   CALCIUM 8.3* 08/24/2013 0438   PROT 4.9* 08/24/2013 0438   ALBUMIN 2.0* 08/24/2013 0438   AST 24 08/24/2013 0438   ALT  39* 08/24/2013 0438   ALKPHOS 116 08/24/2013 0438   BILITOT 0.3 08/24/2013 0438   GFRNONAA >90 08/24/2013 0438   GFRAA >90 08/24/2013 0438   Lipase     Component Value Date/Time   LIPASE 9* 07/29/2013 1605       Studies/Results: Dg Swallowing Func-speech Pathology  08/23/2013   Katherene Ponto Deblois, CCC-SLP     08/23/2013 10:37 AM Objective Swallowing Evaluation: Modified Barium Swallowing Study   Patient Details  Name: FRANCESSCA FRIIS MRN: [id number] Date of Birth: [birth year]  Today's Date: 08/23/2013 Time: 0935-1000 SLP Time Calculation (min): 25 min  Past Medical History:  Past Medical History  Diagnosis Date  . Carotid artery disease     a. 60-70% bilat ICA stenosis by dopplers 05/2012.  Marland Kitchen Other and unspecified hyperlipidemia   . CAD (coronary artery disease)     a. Mod dz 2010 initially mgd medically. b. 12/2012 - angina s/p  PTCA/DES to mid-circumflex, PTCA/DES to first OM.   Marland Kitchen Hypertension   . Heart murmur   . Depression   . Barrett's esophagus   . Arthritis    Past Surgical History:  Past Surgical History  Procedure Laterality Date  . Appendectomy    . Cholecystectomy    . Colon resection    . 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 Rotator cuff repair Left   . Abdominal hysterectomy    . Colon surgery    . Foot surgery    . Hand surgery    . Laparotomy N/A 08/01/2013    Procedure: EXPLORATORY LAPAROTOMY;  Surgeon: Harl Bowie, MD;  Location: [place];  Service: General;  Laterality:  N/A;  . Partial colectomy N/A 08/01/2013    Procedure: PARTIAL COLECTOMY;  Surgeon: Harl Bowie, MD;   Location: [place];  Service: General;  Laterality: N/A;  . Colostomy N/A 08/01/2013    Procedure: COLOSTOMY;  Surgeon: Harl Bowie, MD;   Location: [place];  Service: General;  Laterality: N/A;  . Bowel resection N/A 08/01/2013    Procedure: SMALL BOWEL RESECTION;  Surgeon: Harl Bowie,  MD;  Location: [place];  Service: General;  Laterality: N/A;  .  Tracheostomy      feinstein   HPI:  73 y/o female with PMH CAD, HTN, Barrett's esophagus, presented  to [facility] ED 7/10 with LLQ pain. Found to have bowel obstruction. To  OR for repair 7/13 (Laparotomy, partial colectomy, colostomy, SB  resection, repair of enterotomy, lysis of adhesions). Pt was  extubated on 7/14 but required reintubation until trach on 7/24.  Trach collar trials on 7/26. Pt also found to have a small ICH on  CT.      Assessment / Plan / Recommendation Clinical Impression  Dysphagia Diagnosis: Within Functional Limits Clinical impression: Pt demonstrates normal swallow function,  though she did have intermittent coughing mid study unrelated to  any penetration or aspiration. NG tube was removed mid study to  ensure best swallow function. Given ongoing generalized weakness,  recommend soft dys 3 diet with thin liquids. Pt must wear PMSV  during PO intake with full supervision, though pt should attempt  to feed herself. SLP will f/u for tolerance and upgrade to  regular diet as pts endurance progresses. (Also confirmed with  surgery PA that pt is ready for regular diet).     Treatment Recommendation  Therapy as outlined in treatment plan below    Diet Recommendation Dysphagia 3 (Mechanical Soft);Thin liquid   Liquid Administration via: Cup;Straw Medication Administration: Whole meds with liquid Supervision: Patient able to self feed;Full supervision/cueing  for compensatory strategies Compensations: Slow rate;Small sips/bites Postural Changes and/or Swallow Maneuvers: Seated upright 90  degrees    Other  Recommendations Oral Care Recommendations: Oral care BID Other Recommendations: Place PMSV during PO intake   Follow Up Recommendations  Inpatient Rehab    Frequency and Duration min 2x/week  2 weeks   Pertinent Vitals/Pain NA    SLP Swallow Goals     General HPI: 73 y/o female with PMH CAD, HTN, Barrett's  esophagus, presented to [facility] ED 7/10 with LLQ pain. Found to have  bowel obstruction. To OR for  repair 7/13 (Laparotomy, partial  colectomy, colostomy, SB resection, repair of enterotomy, lysis  of adhesions). Pt was extubated on 7/14 but required reintubation  until trach on 7/24. Trach collar trials on 7/26. Pt also found  to have a small ICH on CT.  Type of Study: Modified Barium Swallowing Study Reason for Referral: Objectively evaluate swallowing function Diet Prior to this Study: NPO;Panda Temperature  Spikes Noted: No Respiratory Status: Trach collar Trach Size and Type: #4;Uncuffed;With PMSV in place History of Recent Intubation: Yes Length of Intubations (days): 12 days Date extubated: 09/12/13 Behavior/Cognition: Alert;Cooperative;Pleasant mood Oral Cavity - Dentition: Dentures, top Oral Motor / Sensory Function: Within functional limits Self-Feeding Abilities: Able to feed self Patient Positioning: Upright in chair Baseline Vocal Quality: Low vocal intensity Volitional Cough: Weak Volitional Swallow: Able to elicit Anatomy: Within functional limits    Reason for Referral Objectively evaluate swallowing function   Oral Phase Oral Preparation/Oral Phase Oral Phase: WFL   Pharyngeal Phase Pharyngeal Phase Pharyngeal Phase: Within functional limits  Cervical Esophageal Phase    GO    Cervical Esophageal Phase Cervical Esophageal Phase: [facility]        Herbie Baltimore, MA CCC-SLP [phone]  Lynann Beaver 08/23/2013, 10:18 AM     Anti-infectives: Anti-infectives   Start     Dose/Rate Route Frequency Ordered Stop   08/13/13 1600  vancomycin (VANCOCIN) IVPB 1000 mg/200 mL premix  Status:  Discontinued     1,000 mg 200 mL/hr over 60 Minutes Intravenous Every 12 hours 08/13/13 1435 08/16/13 1243   08/11/13 1400  vancomycin (VANCOCIN) IVPB 750 mg/150 ml premix  Status:  Discontinued     750 mg 150 mL/hr over 60 Minutes Intravenous Every 12 hours 08/11/13 1138 08/13/13 1435   08/11/13 1300  imipenem-cilastatin (PRIMAXIN) 500 mg in sodium chloride 0.9 % 100 mL IVPB  Status:  Discontinued     500  mg 200 mL/hr over 30 Minutes Intravenous 3 times per day 08/11/13 1138 08/17/13 1055   08/11/13 1230  micafungin (MYCAMINE) 100 mg in sodium chloride 0.9 % 100 mL IVPB  Status:  Discontinued     100 mg 100 mL/hr over 1 Hours Intravenous Daily 08/11/13 1138 08/12/13 1132   08/07/13 1400  piperacillin-tazobactam (ZOSYN) IVPB 3.375 g     3.375 g 12.5 mL/hr over 240 Minutes Intravenous 3 times per day 08/07/13 0921 08/10/13 0101   08/03/13 1800  vancomycin (VANCOCIN) IVPB 750 mg/150 ml premix  Status:  Discontinued     750 mg 150 mL/hr over 60 Minutes Intravenous Every 12 hours 08/03/13 0440 08/04/13 0910   08/03/13 0500  vancomycin (VANCOCIN) 1,250 mg in sodium chloride 0.9 % 250 mL IVPB     1,250 mg 166.7 mL/hr over 90 Minutes Intravenous  Once 08/03/13 0440 08/03/13 0646   08/03/13 0500  piperacillin-tazobactam (ZOSYN) IVPB 3.375 g  Status:  Discontinued     3.375 g 12.5 mL/hr over 240 Minutes Intravenous 3 times per day 08/03/13 0440 08/07/13 0921   08/01/13 1245  metroNIDAZOLE (FLAGYL) IVPB 500 mg     500 mg 100 mL/hr over 60 Minutes Intravenous To Surgery 08/01/13 1238 08/01/13 1300   07/31/13 0800  cefoTEtan (CEFOTAN) 2 g in dextrose 5 % 50 mL IVPB     2 g 100 mL/hr over 30 Minutes Intravenous On call to O.R. 07/31/13 0741 08/01/13 0559   07/31/13 0800  cefTRIAXone (ROCEPHIN) 2 g in dextrose 5 % 50 mL IVPB  Status:  Discontinued    Comments:  Pharmacy may adjust dosing strength / duration / interval for maximal efficacy   2 g 100 mL/hr over 30 Minutes Intravenous Every 24 hours 07/31/13 0744 08/03/13 0405   07/29/13 2100  ciprofloxacin (CIPRO) IVPB 400 mg  Status:  Discontinued     400 mg 200 mL/hr over 60 Minutes Intravenous Every 12 hours 07/29/13 2058 07/31/13 0744   07/29/13 2100  metroNIDAZOLE (FLAGYL) IVPB 500 mg  Status:  Discontinued     500 mg 100 mL/hr over 60 Minutes Intravenous Every 8 hours 07/29/13 2058 08/03/13 0405   07/29/13 2100  fluconazole (DIFLUCAN) IVPB 200  mg  Status:  Discontinued     200 mg 100 mL/hr over 60 Minutes Intravenous Every 24 hours 07/29/13 2058 08/04/13 0911   07/29/13 1930  ciprofloxacin (CIPRO) IVPB 400 mg     400 mg 200 mL/hr over 60 Minutes Intravenous  Once 07/29/13 1915 07/29/13 2031   07/29/13 1930  metroNIDAZOLE (FLAGYL) IVPB 500 mg  Status:  Discontinued     500 mg 100 mL/hr over 60 Minutes Intravenous  Once 07/29/13 1915 07/29/13 2107       Assessment/Plan Colon obstruction POD #23 s/p Ex lap, partial colectomy, end colostomy with Hartmanns procedure, SBR, repair of multiple enterotomies, and LOA  Acute respiratory failure post op s/p trach on 7/25  Post op wound dehiscence  PCM  Inability to take PO - resolved Dysphagia - resolved  Plan:  1. Seems more alert, more commutative, continue with current medical treatment. Appreciate CCM and medicine's care of this patient  2. NG discontinued and started on D3 PO diet since she passed her swallow eval yesterday.   3. Antiemetics, pain meds prn 4. Keep abdominal binder in place to prevent further wound dehiscence. The abdominal binder is okay to be over top the ostomy, but the ostomy needs to be emptied regularly to prevent accidental leakage due to a full bag.  5. Continue saline WD dressing changes to midline wound BID  6. Will see and evaluate again on Friday, and as needed over the weekend 7. Could discharge home per surgical perspective today if needed if medically stable     LOS: 26 days    DORT, Dayson Aboud 08/24/2013, 7:48 AM Pager: [phone]

## 2013-08-24 NOTE — Progress Notes (Signed)
PULMONARY / CRITICAL CARE MEDICINE  Name: AARINI SLEE MRN: [id number] DOB: 22-Sep-1940    ADMISSION DATE: 07/29/2013  CONSULTATION DATE: 08/01/2013   REFERRING MD : Surgery  PRIMARY SERVICE: TRH  CHIEF COMPLAINT: Bowel obstruction   BRIEF PATIENT DESCRIPTION: 73 y/o admitted 7/10 with bowel obstruction requiring surgery on 7/13. Course was complicated by HCAP, ARDS and prolonged respiratory failure requiring tracheostomy.  SIGNIFICANT EVENTS / STUDIES:  8/3  Trach tube downsized to cuffless # 4 8/4  Dysphagia 3 diet started, NG removed  LINES / TUBES:  Trach (DF) 7/24 >>>  R IJ CVL 7/14 >>>   INTERVAL HISTORY:  No respiratory issues overnight.  Pt reports lower abd pain.   VITAL SIGNS: Temp:  [97.9 F (36.6 C)-98.6 F (37 C)] 98 F (36.7 C) (08/05 0759) Pulse Rate:  [89-102] 89 (08/05 0834) Resp:  [16-36] 18 (08/05 0834) BP: (86-104)/(40-73) 95/47 mmHg (08/05 0834) SpO2:  [93 %-100 %] 98 % (08/05 0834) FiO2 (%):  [28 %] 28 % (08/05 0834) Weight:  [158 lb 11.7 oz (72 kg)] 158 lb 11.7 oz (72 kg) (08/05 0420)  PHYSICAL EXAMINATION: General:  No distress Neuro:  Awake, alert, speech clear with PMV HEENT:  Trach site intact Cardiovascular:  Regular, no murmurs Lungs:  Bilateral diminished air entry, no added sounds Abdomen:  Soft, binder in place, colostomy with liquid drainage Musculoskeletal:  Trace edema  LABS:  Recent Labs Lab 08/21/13 0226 08/23/13 0500 08/23/13 1000 08/24/13 0438  NA 136* 135*  --  143  K 3.6* 3.0* 4.1 3.5*  CL 102 97  --  105  CO2 24 27  --  28  BUN 21 18  --  11  CREATININE 0.32* 0.34*  --  0.44*  GLUCOSE 118* 143*  --  91    Recent Labs Lab 08/19/13 0445 08/23/13 0500  HGB 8.8* 7.5*  HCT 28.4* 23.1*  WBC 9.2 7.8  PLT 211 143*   IMAGING:  Dg Swallowing Func-speech Pathology  08/23/2013   Katherene Ponto [place]bl[place], CCC-SLP     08/23/2013 10:37 AM Objective Swallowing Evaluation: Modified Barium Swallowing Study   Patient  Details  Name: KENSLEIGH GATES MRN: [id number] Date of Birth: 1940-05-01  Today's Date: 08/23/2013 Time: 0935-1000 SLP Time Calculation (min): 25 min  Past Medical History:  Past Medical History  Diagnosis Date  . Carotid artery disease     a. 60-70% bilat ICA stenosis by dopplers 05/2012.  Marland Kitchen Other and unspecified hyperlipidemia   . CAD (coronary artery disease)     a. Mod dz 2010 initially mgd medically. b. 12/2012 - angina s/p  PTCA/DES to mid-circumflex, PTCA/DES to first OM.   Marland Kitchen Hypertension   . Heart murmur   . Depression   . Barrett's esophagus   . Arthritis    Past Surgical History:  Past Surgical History  Procedure Laterality Date  . Appendectomy    . Cholecystectomy    . Colon resection    . 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 Rotator cuff repair Left   . Abdominal hysterectomy    . Colon surgery    . Foot surgery    . Hand surgery    . Laparotomy N/A 08/01/2013    Procedure: EXPLORATORY LAPAROTOMY;  Surgeon: Harl Bowie, MD;  Location: [place];  Service: General;  Laterality:  N/A;  . Partial colectomy N/A 08/01/2013  Procedure: PARTIAL COLECTOMY;  Surgeon: Harl Bowie, MD;   Location: [place];  Service: General;  Laterality: N/A;  . Colostomy N/A 08/01/2013    Procedure: COLOSTOMY;  Surgeon: Harl Bowie, MD;   Location: [place];  Service: General;  Laterality: N/A;  . Bowel resection N/A 08/01/2013    Procedure: SMALL BOWEL RESECTION;  Surgeon: Harl Bowie,  MD;  Location: [place];  Service: General;  Laterality: N/A;  . Tracheostomy      feinstein   HPI:  73 y/o female with PMH CAD, HTN, Barrett's esophagus, presented  to [facility] ED 7/10 with LLQ pain. Found to have bowel obstruction. To  OR for repair 7/13 (Laparotomy, partial colectomy, colostomy, SB  resection, repair of enterotomy, lysis of adhesions). Pt was  extubated on 7/14 but required reintubation until trach on 7/24.  Trach collar trials on 7/26. Pt also found to have a small  ICH on  CT.      Assessment / Plan / Recommendation Clinical Impression  Dysphagia Diagnosis: Within Functional Limits Clinical impression: Pt demonstrates normal swallow function,  though she did have intermittent coughing mid study unrelated to  any penetration or aspiration. NG tube was removed mid study to  ensure best swallow function. Given ongoing generalized weakness,  recommend soft dys 3 diet with thin liquids. Pt must wear PMSV  during PO intake with full supervision, though pt should attempt  to feed herself. SLP will f/u for tolerance and upgrade to  regular diet as pts endurance progresses. (Also confirmed with  surgery PA that pt is ready for regular diet).     Treatment Recommendation  Therapy as outlined in treatment plan below    Diet Recommendation Dysphagia 3 (Mechanical Soft);Thin liquid   Liquid Administration via: Cup;Straw Medication Administration: Whole meds with liquid Supervision: Patient able to self feed;Full supervision/cueing  for compensatory strategies Compensations: Slow rate;Small sips/bites Postural Changes and/or Swallow Maneuvers: Seated upright 90  degrees    Other  Recommendations Oral Care Recommendations: Oral care BID Other Recommendations: Place PMSV during PO intake   Follow Up Recommendations  Inpatient Rehab    Frequency and Duration min 2x/week  2 weeks   Pertinent Vitals/Pain NA    SLP Swallow Goals     General HPI: 73 y/o female with PMH CAD, HTN, Barrett's  esophagus, presented to [facility] ED 7/10 with LLQ pain. Found to have  bowel obstruction. To OR for repair 7/13 (Laparotomy, partial  colectomy, colostomy, SB resection, repair of enterotomy, lysis  of adhesions). Pt was extubated on 7/14 but required reintubation  until trach on 7/24. Trach collar trials on 7/26. Pt also found  to have a small ICH on CT.  Type of Study: Modified Barium Swallowing Study Reason for Referral: Objectively evaluate swallowing function Diet Prior to this Study: NPO;Panda Temperature Spikes  Noted: No Respiratory Status: Trach collar Trach Size and Type: #4;Uncuffed;With PMSV in place History of Recent Intubation: Yes Length of Intubations (days): 12 days Date extubated: 09/12/13 Behavior/Cognition: Alert;Cooperative;Pleasant mood Oral Cavity - Dentition: Dentures, top Oral Motor / Sensory Function: Within functional limits Self-Feeding Abilities: Able to feed self Patient Positioning: Upright in chair Baseline Vocal Quality: Low vocal intensity Volitional Cough: Weak Volitional Swallow: Able to elicit Anatomy: Within functional limits    Reason for Referral Objectively evaluate swallowing function   Oral Phase Oral Preparation/Oral Phase Oral Phase: WFL   Pharyngeal Phase Pharyngeal Phase Pharyngeal Phase: Within functional limits  Cervical Esophageal Phase  GO    Cervical Esophageal Phase Cervical Esophageal Phase: [facility]        Herbie Baltimore, MA CCC-SLP [phone]  DeBlois, Katherene Ponto 08/23/2013, 10:18 AM    ASSESSMENT / PLAN:  Acute respiratory failure HCAP ARDS, less likely pulmonary edema Tracheostomy status Dysphagia   Goal SpO2>92  Supplemental oxygen via trach collar   #4 Cuffless:  Cap trach, if does well overnight will decannulate in am 8/6  Dysphagia 3 diet per SLP  Diuresis per primary team  Will follow  Noe Gens, NP-C [place] Pgr: [phone] or [phone]  08/24/2013, 10:44 AM  I have personally obtained history, examined patient, evaluated and interpreted laboratory and imaging results, reviewed medical records, formulated assessment / plan and placed orders.  Doree Fudge, MD Pulmonary and [place] Pager: [phone]  08/24/2013, 11:29 AM

## 2013-08-24 NOTE — Progress Notes (Signed)
Speech Language Pathology Treatment: Dysphagia;Passy Muir Speaking valve  Patient Details Name: [name] MRN: [id number] DOB: 08-30-1940 Today's Date: 08/24/2013 Time: 0940-1000 SLP Time Calculation (min): 20 min  Assessment / Plan / Recommendation Clinical Impression  Pt tolerating diet well, SLP provided min verbal cues for appropriate posture for PO intake and reiterated precautions regarding PMSV usage. Husband has reportedly told other therapists that he feels PMSV is not needed because pts voice is the safe with or without it. SLP demonstrated this is not true and also reiterated importance or wearing valve for weaning pt to capping and decannulation, which finally interested him. SLP guided pt with min conversational cues to extend phrase length and verbalize wants and needs more often. Volume still low and SLP reluctant to attempt diaphragmatic breathing given wound, but instructed pt in 'chunking' phrases to increase breath support and volume with shorter phrase length. Pt return demonstrating. SLP will continue to follow.    HPI HPI: 73 y/o female with PMH CAD, HTN, Barrett's esophagus, presented to [facility] ED 7/10 with LLQ pain. Found to have bowel obstruction. To OR for repair 7/13 (Laparotomy, partial colectomy, colostomy, SB resection, repair of enterotomy, lysis of adhesions). Pt was extubated on 7/14 but required reintubation until trach on 7/24. Trach collar trials on 7/26. Pt also found to have a small ICH on CT.    Pertinent Vitals NA  SLP Plan  Continue with current plan of care    Recommendations Diet recommendations: Dysphagia 3 (mechanical soft);Thin liquid Liquids provided via: Cup;Straw Medication Administration: Whole meds with liquid Supervision: Patient able to self feed;Full supervision/cueing for compensatory strategies Compensations: Slow rate;Small sips/bites Postural Changes and/or Swallow Maneuvers: Seated upright 90 degrees      Patient may use  Passy-Muir Speech Valve: During all waking hours (remove during sleep) PMSV Supervision: Full       Oral Care Recommendations: Oral care BID Follow up Recommendations: Skilled Nursing facility Plan: Continue with current plan of care    GO    [facility], MA CCC-SLP [phone]  Lynann Beaver 08/24/2013, 10:05 AM

## 2013-08-25 DIAGNOSIS — R339 Retention of urine, unspecified: Secondary | ICD-10-CM

## 2013-08-25 LAB — CBC
HCT: 24.1 % — ABNORMAL LOW (ref 36.0–46.0)
Hemoglobin: 7.6 g/dL — ABNORMAL LOW (ref 12.0–15.0)
MCH: 29.2 pg (ref 26.0–34.0)
MCHC: 31.5 g/dL (ref 30.0–36.0)
MCV: 92.7 fL (ref 78.0–100.0)
Platelets: 177 10*3/uL (ref 150–400)
RBC: 2.6 MIL/uL — ABNORMAL LOW (ref 3.87–5.11)
RDW: 18.2 % — ABNORMAL HIGH (ref 11.5–15.5)
WBC: 4 10*3/uL (ref 4.0–10.5)

## 2013-08-25 LAB — BASIC METABOLIC PANEL
Anion gap: 10 (ref 5–15)
BUN: 6 mg/dL (ref 6–23)
CALCIUM: 8.5 mg/dL (ref 8.4–10.5)
CHLORIDE: 108 meq/L (ref 96–112)
CO2: 26 mEq/L (ref 19–32)
CREATININE: 0.4 mg/dL — AB (ref 0.50–1.10)
GFR calc non Af Amer: >90 mL/min (ref >90)
Glucose, Bld: 94 mg/dL (ref 70–99)
Potassium: 4 mEq/L (ref 3.7–5.3)
Sodium: 144 mEq/L (ref 137–147)

## 2013-08-25 LAB — GLUCOSE, CAPILLARY
Glucose-Capillary: 96 mg/dL (ref 70–99)
Glucose-Capillary: 109 mg/dL — ABNORMAL HIGH (ref 70–99)
Glucose-Capillary: 90 mg/dL (ref 70–99)
Glucose-Capillary: 133 mg/dL — ABNORMAL HIGH (ref 70–99)
Glucose-Capillary: 77 mg/dL (ref 70–99)

## 2013-08-25 LAB — MAGNESIUM: Magnesium: 2 mg/dL (ref 1.5–2.5)

## 2013-08-25 MED ORDER — ENSURE COMPLETE PO LIQD
237.0000 mL | Freq: Two times a day (BID) | ORAL | Status: DC
  Administered 2013-08-25 – 2013-08-26 (×4): 237 mL via ORAL

## 2013-08-25 NOTE — Progress Notes (Signed)
I have seen and examined the patient and agree with the assessment and plans. Continuing wound care  Taylorann Tkach A. Ninfa Linden  MD, FACS

## 2013-08-25 NOTE — Progress Notes (Signed)
PULMONARY / CRITICAL CARE MEDICINE  Name: [name] MRN: [id number] DOB: Sep 30, 1940    ADMISSION DATE: 07/29/2013  CONSULTATION DATE: 08/01/2013   REFERRING MD : Surgery  PRIMARY SERVICE: TRH  CHIEF COMPLAINT: Bowel obstruction   BRIEF PATIENT DESCRIPTION: 73 y/o admitted 7/10 with bowel obstruction requiring surgery on 7/13. Course was complicated by HCAP, ARDS and prolonged respiratory failure requiring tracheostomy.  SIGNIFICANT EVENTS / STUDIES:  8/3  Trach tube downsized to cuffless # 4 8/4  Dysphagia 3 diet started, NG removed  LINES / TUBES:  Trach (DF) 7/24 >>> 8/6 R IJ CVL 7/14 >>>   INTERVAL HISTORY:  Capped trach overnight without issues.  VITAL SIGNS: Temp:  [97.6 F (36.4 C)-98.5 F (36.9 C)] 98 F (36.7 C) (08/06 0753) Pulse Rate:  [88-102] 88 (08/06 0820) Resp:  [18-25] 21 (08/06 0820) BP: (93-111)/(46-55) 93/50 mmHg (08/06 0820) SpO2:  [94 %-98 %] 96 % (08/06 0820) FiO2 (%):  [28 %] 28 % (08/05 1200)  PHYSICAL EXAMINATION: General:  Comfortable, no distress Neuro:  Awake, alert, verbalizes well HEENT:  Trach site intact, capped Cardiovascular:  Regular, no murmurs Lungs:  CTAB Abdomen:  Soft Musculoskeletal:  Trace edema  LABS:  Recent Labs Lab 08/23/13 0500 08/23/13 1000 08/24/13 0438 08/25/13 0440  NA 135*  --  143 144  K 3.0* 4.1 3.5* 4.0  CL 97  --  105 108  CO2 27  --  28 26  BUN 18  --  11 6  CREATININE 0.34*  --  0.44* 0.40*  GLUCOSE 143*  --  91 94    Recent Labs Lab 08/19/13 0445 08/23/13 0500 08/25/13 0440  HGB 8.8* 7.5* 7.6*  HCT 28.4* 23.1* 24.1*  WBC 9.2 7.8 4.0  PLT 211 143* 177   IMAGING:  No results found.  ASSESSMENT / PLAN:  Acute respiratory failure, resolved HCAP, resolved ARDS, less likely pulmonary edema, improving Tracheostomy status Dysphagia  Decannulate  Goal SpO2>92  Supplemental oxygen PRN  Albuterol / Atrovent PRN  May need SLP re evaluation after decannulated  PCCM will sign  off, please re consult PRN  I have personally obtained history, examined patient, evaluated and interpreted laboratory and imaging results, reviewed medical records, formulated assessment / plan and placed orders.  Doree Fudge, MD Pulmonary and [place] Pager: [phone]  08/25/2013, 11:24 AM

## 2013-08-25 NOTE — Progress Notes (Addendum)
NUTRITION FOLLOW UP  Intervention:   Ensure Complete po BID, each supplement provides 350 kcal and 13 grams of protein RD to follow for nutrition care plan  Nutrition Dx:   Inadequate oral intake now related to limited appetite as evidenced by PO intake 50%, improved  Goal:   Intake to meet >90% of estimated nutrition needs, progressing  Monitor:   PO & supplemental intake, weight, labs, I/O's  Assessment:   PMHx significant for diverticulitis, CAD, HTN, depression. Admitted with abdominal pain and c/n x 2 weeks. Hx of lysis of adhesions and ovarian cystectomy for SBO in April 2015. Work-up reveals colonic obstruction proximal to descending colon, MD suspects etiology of ischemia vs malignancy.  S/P laparotomy, partial colectomy, colostomy, SB resection, repair of enterotomy, lysis of adhesions on 7/13. Received TPN from 7/13 to 7/20 when TPN was discontinued.  7/27:  TF was discontinued after trach placement on 7/24 due to bleeding from ostomy site. TF resumed on 7/26 at 20 ml/h.  Patient is currently receiving Vital High Protein at 20 ml/h (480 ml per day) to provide 480 kcals (29% of estimated needs), 42 gm protein (46% of estimated needs), 401 ml free water daily.  Spoke with Dr. Hulen Skains regarding advancing TF rate, okay for RD to increase rate by 10 ml every 4 hours to goal of 50 ml/h.  8/3:  Patient remains on trach collar.  Not ready for decannulation.  Vital HP formula currently infusing at 50 ml/hr via small bore feeding tube (tip in distal stomach) providing 1200 kcals, 105 gm protein, 1003 ml free water daily.  Free water flushes at 350 ml every 6 hours.  SLP following for PM valve.  8/6:  Patient currently on room air.  TF (Vital AF 1.2 formula) and feeding tube discontinued.  Diet advanced to Dys 3, thin liquids.  PO intake 50% per flowsheet records.  RD consulted to ensure adequate PO intake.  Will order oral nutrition supplements.  Height: Ht Readings from Last 1 Encounters:   08/02/13 5' (1.524 m)   Weight Status:   Wt Readings from Last 1 Encounters:  08/24/13 158 lb 11.7 oz (72 kg)  07/29/13  154 lb 3.2 oz (69.945 kg)    Body mass index is 31 kg/(m^2).  Re-estimated needs:  Kcal: 1600-1800 Protein: 95-105 gm  Fluid: 1.6-1.8 L  Skin: abdominal incision  Diet Order: Dysphagia 3, thin liquids   Intake/Output Summary (Last 24 hours) at 08/25/13 1028 Last data filed at 08/25/13 0240  Gross per 24 hour  Intake    240 ml  Output   1525 ml  Net  -1285 ml    Labs:   Recent Labs Lab 08/21/13 0226 08/23/13 0500 08/23/13 1000 08/24/13 0438 08/25/13 0440  NA 136* 135*  --  143 144  K 3.6* 3.0* 4.1 3.5* 4.0  CL 102 97  --  105 108  CO2 24 27  --  28 26  BUN 21 18  --  11 6  CREATININE 0.32* 0.34*  --  0.44* 0.40*  CALCIUM 7.9* 8.0*  --  8.3* 8.5  MG 1.4*  --  1.5  --  2.0  GLUCOSE 118* 143*  --  91 94    CBG (last 3)   Recent Labs  08/24/13 2349 08/25/13 0434 08/25/13 0752  GLUCAP 108* 96 109*    Scheduled Meds: . antiseptic oral rinse  15 mL Mouth Rinse QID  . chlorhexidine  15 mL Mouth Rinse BID  . free  water  350 mL Per Tube 4 times per day  . insulin aspart  0-20 Units Subcutaneous 6 times per day  . lip balm  1 application Topical BID  . pantoprazole  40 mg Oral Daily    Continuous Infusions: . feeding supplement (VITAL AF 1.2 CAL) 1,000 mL (08/22/13 1200)    Arthur Holms, RD, LDN Pager #: [phone] After-Hours Pager #: [phone]

## 2013-08-25 NOTE — Progress Notes (Signed)
[place] TEAM 1 - Stepdown/ICU TEAM Progress Note  [name] HCW:[id number] DOB: 1940-06-17 DOA: 07/29/2013 PCP: Laverna Peace, NP  Brief Narrative: 73 y.o. female who presented with a 2 week history of increased abdominal pain with constipation and nausea but no vomiting.   Her history started on April 10 when she had lysis of adhesions and ovarian cystectomy for small bowel obstruction. Her family reported that she had a complication of anesthesia during the surgery. She returned to the hospital few days later with abdominal pain that was diagnosed as colitis and she was discharged home on Levaquin and Flagyl. Her pain improved slightly but she started having abdominal pain again and was admitted to [facility] and was continued on antibiotics with no need for surgical intervention. Gastroenterology followed the patient and did not recommend aggressive intervention so she was discharged home. She started having worsening of her abdominal pain over the previous 2 weeks associated with nausea but no vomiting. She reported severe constipation and had not had any bowel movement for 1 week. Patient had a colonoscopy one week prior to presentation but they could not cross beyond the splenic flexure according to the husband.   She presented to the ER on 710 because of the above symptoms. CT of the abdomen was done and showed obstruction at the level of the colon with small bowel distention. Surgery was consulted. She went to the OR for repair 7/13 and remained on the vent post-op.   HPI/Subjective: Sitting up in bed -wanting to know when trach will be dc'd  Assessment/Plan:  Colonic obstruction due to suspected colitis; s/p partial colectomy/colostomy  -per surgery  -issues with wound dehiscence post op so requiring abd binder (in place properly) -still having liquid stool from ostomy so as precaution check C Diff PCR -per surgery team stable from surgical standpoint   Urinary  retention -Foley dc'd 8/4 but since has required I/O cath for retained urine > 600 so reinserted foley on 8/6   -UA/cx negative   Anasarca  -due to malnutrition and critical illness-slowly resolving   Acute respiratory failure with hypoxia/Tracheostomy status  -initial etiology unclear but differential included ARDS, HCAP  -pulmonary edema from 3rd spacing in setting of hypoalbunemia has resolved clinically -PCCM downsized trach to #4 cuffless and 8/5 plugged trach - decannulated 8/6  Dehydration with hypernatremia  -resolved  Protein-calorie malnutrition, severe /dysphagia  -8/4 SLP recommended D3 diet with thin liquids after MBS-has tolerated well   Hypokalemia -repleted - repeat labs  HTN (hypertension)  -prn Lopressor-BP remains VERY soft  DVT of axillary vein, acute right  -Heparin on hold due to recent issues with GIB sx's   Very small cerebral bleed  -tiny SAH vs SDH seen on CT head from 7/23 - completed due to altered mentation   Anemia  -baseline Hgb 11  -currently stable   CAD (coronary artery disease)   Depression   Barrett's esophagus   Physical deconditioning  -CIR denied by insurance company so will go to SNF once medically stable  Code Status: DNR Family Communication: Husband present  Disposition Plan: Stepdown-plan transfer to floor 8/7 now that decannulated  Consultants: Dr. Simonne Maffucci (PCCM.) Dr. Judeth Horn (surgery)  Procedure/Significant Events: 7/10 Admit, CT Abd - dilated prox colon w/ abrupt transition c/w with obstruction, diverticulitis vs colitis, Surgery c/s  7/12 Empiric Cipro/Flagyl, Cardiology consulted for pre-op evaluation, TPN per pharm  7/13 - Laparotomy, partial colectomy, colostomy, SB resection, repair of enterotomy, lysis of adhesions  -  Hgb down to 6.8 >> imp to 10.8 s/p 2u PRBC  7/14 - Extubated, required re-intubation due to hypoxemic resp failure, hypotension, concern for aspiration  7/15 - 2D echo>>> severely  dilated LA, normal systolic function  2/24 - Considered ARDS (unclear etiology, ?aspiration), CXR (persistent b/l edema), cont pressors, stable low PCT 1.9  7/17 - CT abd >no bleeding , no abscess, sm fluid in abd/pelvis  7/17 - Ven Dopp>+DVT Rt axillary vein  7/23 -CT Head (subcentimeter focal parenchymal hemorrhage - no SAH or subdural, no acute ischemic change)  - CT chest w/ contrast (severe multilobar pneumonia, moderate b/l pleural effusions, also favor chronicity  - CT Abd/Pelvis (IV, PO) (post-op changes, s/p L-hemicolectomy, transverse colon markedly thickened, unclear if related to acute inflammation/infection vs secondary distention/post-op edema)  7/24 - s/p bedside trach (DF) / bronchoscopy  7/25 - melena with dec Hgb  7/28 - tol ATC  8/2 PCXR;Cardiomegaly with mild interstitial edema.-Patchy bilateral lower lobe opacities, possibly atelectasis, left lower lobe not excluded.  -Small bilateral pleural effusions, Lt>Rt.  -Tracheostomy in satisfactory position.  Antibiotics: Ceftriaxone 7/10 >>> 7/14  Flagyl 7/10 >>> 7/14  Vancomycin 7/14 >>> 7/16  Fluconazole 7/10 >>> 7/16  Zosyn 7/14 >> 7/21  Vancomycin 7/23 >>>off  Primaxin 7/23 >>>7/29  Micafungin 7/23 >>>7/24  DVT prophylaxis: SCD  Objective: VITAL SIGNS: Temp: 98 F (36.7 C) (08/06 0753) Temp src: Oral (08/06 0753) BP: 93/50 mmHg (08/06 0820) Pulse Rate: 88 (08/06 0820) SPO2; 100% on trach collar 5 L/minute;  FIO2: 28%  Intake/Output Summary (Last 24 hours) at 08/25/13 1057 Last data filed at 08/25/13 0240  Gross per 24 hour  Intake    240 ml  Output   1525 ml  Net  -1285 ml   Exam: Gen: No acute respiratory distress  Chest:  Clear to auscultation without rhonchi or wheezes, trach plugged -RA Cardiac: Regular rate and rhythm, no rubs murmurs or gallops, no peripheral edema Abdomen: Soft tender expected tenderness over surgical incision, nondistended without obvious hepatosplenomegaly, colostomy with  yellow brown liquid stool in drainage bag Extremities: Symmetrical in appearance without cyanosis, clubbing or effusion  Data Reviewed: Basic Metabolic Panel:  Recent Labs Lab 08/20/13 0501 08/21/13 0226 08/23/13 0500 08/23/13 1000 08/24/13 0438 08/25/13 0440  NA 145 136* 135*  --  143 144  K 3.6* 3.6* 3.0* 4.1 3.5* 4.0  CL 111 102 97  --  105 108  CO2 28 24 27   --  28 26  GLUCOSE 131* 118* 143*  --  91 94  BUN 32* 21 18  --  11 6  CREATININE 0.33* 0.32* 0.34*  --  0.44* 0.40*  CALCIUM 8.2* 7.9* 8.0*  --  8.3* 8.5  MG  --  1.4*  --  1.5  --  2.0   Liver Function Tests:  Recent Labs Lab 08/20/13 0501 08/21/13 0226 08/24/13 0438  AST 18 24 24   ALT 40* 40* 39*  ALKPHOS 81 82 116  BILITOT 0.5 0.5 0.3  PROT 4.8* 4.5* 4.9*  ALBUMIN 2.1* 2.0* 2.0*   No results found for this basename: LIPASE, AMYLASE,  in the last 168 hours No results found for this basename: AMMONIA,  in the last 168 hours  CBC:  Recent Labs Lab 08/19/13 0445 08/23/13 0500 08/25/13 0440  WBC 9.2 7.8 4.0  HGB 8.8* 7.5* 7.6*  HCT 28.4* 23.1* 24.1*  MCV 94.7 89.9 92.7  PLT 211 143* 177   CBG:  Recent Labs Lab 08/24/13 1644  08/24/13 1945 08/24/13 2349 08/25/13 0434 08/25/13 0752  GLUCAP 103* 107* 108* 96 109*    Recent Results (from the past 240 hour(s))  CLOSTRIDIUM DIFFICILE BY PCR     Status: None   Collection Time    08/24/13  3:08 PM      Result Value Ref Range Status   C difficile by pcr NEGATIVE  NEGATIVE Final     Studies:  Recent x-ray studies have been reviewed in detail by the Attending Physician  Scheduled Meds:  Scheduled Meds: . antiseptic oral rinse  15 mL Mouth Rinse QID  . chlorhexidine  15 mL Mouth Rinse BID  . feeding supplement (ENSURE COMPLETE)  237 mL Oral BID BM  . insulin aspart  0-20 Units Subcutaneous 6 times per day  . lip balm  1 application Topical BID  . pantoprazole  40 mg Oral Daily    Time spent on care of this patient: 35  mins  ELLIS,ALLISON L. , ANP  [facility] Office  [phone] Pager - [phone]  On-Call/Text Page:      [email or web address]      password [email or web address]  If 7PM-7AM, please contact night-coverage [email or web address] Password [email or web address] 08/25/2013, 10:57 AM   LOS: 27 days   Examined patient with ANP Ebony Hail and agree with assessment and plan. Discuss plan with family members and answered all questions. Patient with multiple complex medical problems direct patient care> 40 minute

## 2013-08-25 NOTE — Progress Notes (Signed)
Pt decannulated per MD order. Gauze and tape bandage placed. Pt able to vocalize. HR 97, RR 26, Sat 96% on room air. No complications noted.

## 2013-08-25 NOTE — Progress Notes (Signed)
Patient ID: [name], female   DOB: Jan 15, 1941, 73 y.o.   MRN: [id number]     [facility]      [address] [place], [place] [address]    Phone: [phone] FAX: [phone]     Subjective: Appetite is okay.  Mostly has low pelvic pain. VSS.  Afebrile.    Objective:  Vital signs:  Filed Vitals:   08/24/13 2358 08/25/13 0400 08/25/13 0417 08/25/13 0753  BP:  110/49  103/47  Pulse:  93 93 89  Temp: 97.9 F (36.6 C) 97.8 F (36.6 C)  98 F (36.7 C)  TempSrc: Oral Oral  Oral  Resp:  $Remo'18 18 19  'yQezS$ Height:      Weight:      SpO2:  94% 94% 97%    Last BM Date: 08/23/13  Intake/Output   Yesterday:  08/05 0701 - 08/06 0700 In: 360 [P.O.:360] Out: 1525 [Urine:1525] This shift:    I/O last 3 completed shifts: In: 360 [P.O.:360] Out: 2725 [Urine:2725]    Physical Exam: General: Pt awake/alert/oriented x4 in no acute distress Abdomen: Soft.  Nondistended.midline wound some fibrinous exudate, otherwise beefy red, sutures are visible, no visible bowel, repacked and abdominal binder was packed.  Left ostomy is viable with adequate output.  No evidence of peritonitis.  No incarcerated hernias.   Problem List:   Active Problems:   CAD (coronary artery disease)   Depression   Barrett's esophagus   Colonic obstruction due to suspected colitis; s/p colectomy/colostomy   Protein-calorie malnutrition, severe w/ electrolyte imbalance   Anasarca   Acute respiratory failure with hypoxia   Tracheostomy status   Physical deconditioning   HTN (hypertension)   Dehydration with hypernatremia   DVT of axillary vein, acute right   Anemia    Results:   Labs: Results for orders placed during the hospital encounter of 07/29/13 (from the past 48 hour(s))  POTASSIUM     Status: None   Collection Time    08/23/13 10:00 AM      Result Value Ref Range   Potassium 4.1  3.7 - 5.3 mEq/L  MAGNESIUM     Status: None   Collection  Time    08/23/13 10:00 AM      Result Value Ref Range   Magnesium 1.5  1.5 - 2.5 mg/dL  GLUCOSE, CAPILLARY     Status: Abnormal   Collection Time    08/23/13 11:14 AM      Result Value Ref Range   Glucose-Capillary 120 (*) 70 - 99 mg/dL   Comment 1 Documented in Chart    GLUCOSE, CAPILLARY     Status: Abnormal   Collection Time    08/23/13  3:40 PM      Result Value Ref Range   Glucose-Capillary 142 (*) 70 - 99 mg/dL   Comment 1 Documented in Chart    GLUCOSE, CAPILLARY     Status: None   Collection Time    08/23/13  8:16 PM      Result Value Ref Range   Glucose-Capillary 78  70 - 99 mg/dL  GLUCOSE, CAPILLARY     Status: None   Collection Time    08/24/13 12:03 AM      Result Value Ref Range   Glucose-Capillary 90  70 - 99 mg/dL   Comment 1 Notify RN    GLUCOSE, CAPILLARY     Status: None   Collection Time    08/24/13  4:08 AM      Result Value Ref Range   Glucose-Capillary 97  70 - 99 mg/dL   Comment 1 Notify RN    COMPREHENSIVE METABOLIC PANEL     Status: Abnormal   Collection Time    08/24/13  4:38 AM      Result Value Ref Range   Sodium 143  137 - 147 mEq/L   Comment: DELTA CHECK NOTED   Potassium 3.5 (*) 3.7 - 5.3 mEq/L   Chloride 105  96 - 112 mEq/L   CO2 28  19 - 32 mEq/L   Glucose, Bld 91  70 - 99 mg/dL   BUN 11  6 - 23 mg/dL   Creatinine, Ser 0.44 (*) 0.50 - 1.10 mg/dL   Calcium 8.3 (*) 8.4 - 10.5 mg/dL   Total Protein 4.9 (*) 6.0 - 8.3 g/dL   Albumin 2.0 (*) 3.5 - 5.2 g/dL   AST 24  0 - 37 U/L   ALT 39 (*) 0 - 35 U/L   Alkaline Phosphatase 116  39 - 117 U/L   Total Bilirubin 0.3  0.3 - 1.2 mg/dL   GFR calc non Af Amer >90  >90 mL/min   GFR calc Af Amer >90  >90 mL/min   Comment: (NOTE)     The eGFR has been calculated using the CKD EPI equation.     This calculation has not been validated in all clinical situations.     eGFR's persistently <90 mL/min signify possible Chronic Kidney     Disease.   Anion gap 10  5 - 15  GLUCOSE, CAPILLARY      Status: None   Collection Time    08/24/13  8:02 AM      Result Value Ref Range   Glucose-Capillary 92  70 - 99 mg/dL  URINALYSIS, ROUTINE W REFLEX MICROSCOPIC     Status: None   Collection Time    08/24/13 11:38 AM      Result Value Ref Range   Color, Urine YELLOW  YELLOW   APPearance CLEAR  CLEAR   Specific Gravity, Urine 1.012  1.005 - 1.030   pH 6.5  5.0 - 8.0   Glucose, UA NEGATIVE  NEGATIVE mg/dL   Hgb urine dipstick NEGATIVE  NEGATIVE   Bilirubin Urine NEGATIVE  NEGATIVE   Ketones, ur NEGATIVE  NEGATIVE mg/dL   Protein, ur NEGATIVE  NEGATIVE mg/dL   Urobilinogen, UA 0.2  0.0 - 1.0 mg/dL   Nitrite NEGATIVE  NEGATIVE   Leukocytes, UA NEGATIVE  NEGATIVE   Comment: MICROSCOPIC NOT DONE ON URINES WITH NEGATIVE PROTEIN, BLOOD, LEUKOCYTES, NITRITE, OR GLUCOSE <1000 mg/dL.  GLUCOSE, CAPILLARY     Status: Abnormal   Collection Time    08/24/13 12:19 PM      Result Value Ref Range   Glucose-Capillary 124 (*) 70 - 99 mg/dL  CLOSTRIDIUM DIFFICILE BY PCR     Status: None   Collection Time    08/24/13  3:08 PM      Result Value Ref Range   C difficile by pcr NEGATIVE  NEGATIVE  GLUCOSE, CAPILLARY     Status: Abnormal   Collection Time    08/24/13  4:44 PM      Result Value Ref Range   Glucose-Capillary 103 (*) 70 - 99 mg/dL   Comment 1 Notify RN    GLUCOSE, CAPILLARY     Status: Abnormal   Collection Time    08/24/13  7:45 PM  Result Value Ref Range   Glucose-Capillary 107 (*) 70 - 99 mg/dL  GLUCOSE, CAPILLARY     Status: Abnormal   Collection Time    08/24/13 11:49 PM      Result Value Ref Range   Glucose-Capillary 108 (*) 70 - 99 mg/dL   Comment 1 Notify RN    GLUCOSE, CAPILLARY     Status: None   Collection Time    08/25/13  4:34 AM      Result Value Ref Range   Glucose-Capillary 96  70 - 99 mg/dL  BASIC METABOLIC PANEL     Status: Abnormal   Collection Time    08/25/13  4:40 AM      Result Value Ref Range   Sodium 144  137 - 147 mEq/L   Potassium 4.0  3.7 -  5.3 mEq/L   Chloride 108  96 - 112 mEq/L   CO2 26  19 - 32 mEq/L   Glucose, Bld 94  70 - 99 mg/dL   BUN 6  6 - 23 mg/dL   Creatinine, Ser 0.40 (*) 0.50 - 1.10 mg/dL   Calcium 8.5  8.4 - 10.5 mg/dL   GFR calc non Af Amer >90  >90 mL/min   GFR calc Af Amer >90  >90 mL/min   Comment: (NOTE)     The eGFR has been calculated using the CKD EPI equation.     This calculation has not been validated in all clinical situations.     eGFR's persistently <90 mL/min signify possible Chronic Kidney     Disease.   Anion gap 10  5 - 15  CBC     Status: Abnormal   Collection Time    08/25/13  4:40 AM      Result Value Ref Range   WBC 4.0  4.0 - 10.5 K/uL   RBC 2.60 (*) 3.87 - 5.11 MIL/uL   Hemoglobin 7.6 (*) 12.0 - 15.0 g/dL   HCT 24.1 (*) 36.0 - 46.0 %   MCV 92.7  78.0 - 100.0 fL   MCH 29.2  26.0 - 34.0 pg   MCHC 31.5  30.0 - 36.0 g/dL   RDW 18.2 (*) 11.5 - 15.5 %   Platelets 177  150 - 400 K/uL  MAGNESIUM     Status: None   Collection Time    08/25/13  4:40 AM      Result Value Ref Range   Magnesium 2.0  1.5 - 2.5 mg/dL    Imaging / Studies: Dg Swallowing Func-speech Pathology  08/23/2013   Katherene Ponto Deblois, CCC-SLP     08/23/2013 10:37 AM Objective Swallowing Evaluation: Modified Barium Swallowing Study   Patient Details  Name: LUCELY LEARD MRN: [id number] Date of Birth: 1940/02/10  Today's Date: 08/23/2013 Time: 0935-1000 SLP Time Calculation (min): 25 min  Past Medical History:  Past Medical History  Diagnosis Date  . Carotid artery disease     a. 60-70% bilat ICA stenosis by dopplers 05/2012.  Marland Kitchen Other and unspecified hyperlipidemia   . CAD (coronary artery disease)     a. Mod dz 2010 initially mgd medically. b. 12/2012 - angina s/p  PTCA/DES to mid-circumflex, PTCA/DES to first OM.   Marland Kitchen Hypertension   . Heart murmur   . Depression   . Barrett's esophagus   . Arthritis    Past Surgical History:  Past Surgical History  Procedure Laterality Date  . Appendectomy    . Cholecystectomy    .  Colon  resection    . 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 Rotator cuff repair Left   . Abdominal hysterectomy    . Colon surgery    . Foot surgery    . Hand surgery    . Laparotomy N/A 08/01/2013    Procedure: EXPLORATORY LAPAROTOMY;  Surgeon: Harl Bowie, MD;  Location: [facility];  Service: General;  Laterality:  N/A;  . Partial colectomy N/A 08/01/2013    Procedure: PARTIAL COLECTOMY;  Surgeon: Harl Bowie, MD;   Location: [facility];  Service: General;  Laterality: N/A;  . Colostomy N/A 08/01/2013    Procedure: COLOSTOMY;  Surgeon: Harl Bowie, MD;   Location: [facility];  Service: General;  Laterality: N/A;  . Bowel resection N/A 08/01/2013    Procedure: SMALL BOWEL RESECTION;  Surgeon: Harl Bowie,  MD;  Location: [facility];  Service: General;  Laterality: N/A;  . Tracheostomy      feinstein   HPI:  73 y/o female with PMH CAD, HTN, Barrett's esophagus, presented  to [facility] ED 7/10 with LLQ pain. Found to have bowel obstruction. To  OR for repair 7/13 (Laparotomy, partial colectomy, colostomy, SB  resection, repair of enterotomy, lysis of adhesions). Pt was  extubated on 7/14 but required reintubation until trach on 7/24.  Trach collar trials on 7/26. Pt also found to have a small ICH on  CT.      Assessment / Plan / Recommendation Clinical Impression  Dysphagia Diagnosis: Within Functional Limits Clinical impression: Pt demonstrates normal swallow function,  though she did have intermittent coughing mid study unrelated to  any penetration or aspiration. NG tube was removed mid study to  ensure best swallow function. Given ongoing generalized weakness,  recommend soft dys 3 diet with thin liquids. Pt must wear PMSV  during PO intake with full supervision, though pt should attempt  to feed herself. SLP will f/u for tolerance and upgrade to  regular diet as pts endurance progresses. (Also confirmed with  surgery PA that pt is ready for regular diet).      Treatment Recommendation  Therapy as outlined in treatment plan below    Diet Recommendation Dysphagia 3 (Mechanical Soft);Thin liquid   Liquid Administration via: Cup;Straw Medication Administration: Whole meds with liquid Supervision: Patient able to self feed;Full supervision/cueing  for compensatory strategies Compensations: Slow rate;Small sips/bites Postural Changes and/or Swallow Maneuvers: Seated upright 90  degrees    Other  Recommendations Oral Care Recommendations: Oral care BID Other Recommendations: Place PMSV during PO intake   Follow Up Recommendations  Inpatient Rehab    Frequency and Duration min 2x/week  2 weeks   Pertinent Vitals/Pain NA    SLP Swallow Goals     General HPI: 73 y/o female with PMH CAD, HTN, Barrett's  esophagus, presented to [facility] ED 7/10 with LLQ pain. Found to have  bowel obstruction. To OR for repair 7/13 (Laparotomy, partial  colectomy, colostomy, SB resection, repair of enterotomy, lysis  of adhesions). Pt was extubated on 7/14 but required reintubation  until trach on 7/24. Trach collar trials on 7/26. Pt also found  to have a small ICH on CT.  Type of Study: Modified Barium Swallowing Study Reason for Referral: Objectively evaluate swallowing function Diet Prior to this Study: NPO;Panda Temperature Spikes Noted: No Respiratory Status: Trach collar Trach Size and Type: #4;Uncuffed;With PMSV in place History of  Recent Intubation: Yes Length of Intubations (days): 12 days Date extubated: 09/12/13 Behavior/Cognition: Alert;Cooperative;Pleasant mood Oral Cavity - Dentition: Dentures, top Oral Motor / Sensory Function: Within functional limits Self-Feeding Abilities: Able to feed self Patient Positioning: Upright in chair Baseline Vocal Quality: Low vocal intensity Volitional Cough: Weak Volitional Swallow: Able to elicit Anatomy: Within functional limits    Reason for Referral Objectively evaluate swallowing function   Oral Phase Oral Preparation/Oral Phase Oral Phase: WFL    Pharyngeal Phase Pharyngeal Phase Pharyngeal Phase: Within functional limits  Cervical Esophageal Phase    GO    Cervical Esophageal Phase Cervical Esophageal Phase: [facility]        Harlon Ditty, MA CCC-SLP [phone]  DeBlois, Riley Nearing 08/23/2013, 10:18 AM     Medications / Allergies:  Scheduled Meds: . antiseptic oral rinse  15 mL Mouth Rinse QID  . chlorhexidine  15 mL Mouth Rinse BID  . free water  350 mL Per Tube 4 times per day  . insulin aspart  0-20 Units Subcutaneous 6 times per day  . lip balm  1 application Topical BID  . pantoprazole  40 mg Oral Daily   Continuous Infusions: . feeding supplement (VITAL AF 1.2 CAL) 1,000 mL (08/22/13 1200)   PRN Meds:.acetaminophen, diphenhydrAMINE, ipratropium-albuterol, metoprolol, ondansetron (ZOFRAN) IV, oxyCODONE, sodium chloride  Antibiotics: Anti-infectives   Start     Dose/Rate Route Frequency Ordered Stop   08/13/13 1600  vancomycin (VANCOCIN) IVPB 1000 mg/200 mL premix  Status:  Discontinued     1,000 mg 200 mL/hr over 60 Minutes Intravenous Every 12 hours 08/13/13 1435 08/16/13 1243   08/11/13 1400  vancomycin (VANCOCIN) IVPB 750 mg/150 ml premix  Status:  Discontinued     750 mg 150 mL/hr over 60 Minutes Intravenous Every 12 hours 08/11/13 1138 08/13/13 1435   08/11/13 1300  imipenem-cilastatin (PRIMAXIN) 500 mg in sodium chloride 0.9 % 100 mL IVPB  Status:  Discontinued     500 mg 200 mL/hr over 30 Minutes Intravenous 3 times per day 08/11/13 1138 08/17/13 1055   08/11/13 1230  micafungin (MYCAMINE) 100 mg in sodium chloride 0.9 % 100 mL IVPB  Status:  Discontinued     100 mg 100 mL/hr over 1 Hours Intravenous Daily 08/11/13 1138 08/12/13 1132   08/07/13 1400  piperacillin-tazobactam (ZOSYN) IVPB 3.375 g     3.375 g 12.5 mL/hr over 240 Minutes Intravenous 3 times per day 08/07/13 0921 08/10/13 0101   08/03/13 1800  vancomycin (VANCOCIN) IVPB 750 mg/150 ml premix  Status:  Discontinued     750 mg 150 mL/hr over 60 Minutes  Intravenous Every 12 hours 08/03/13 0440 08/04/13 0910   08/03/13 0500  vancomycin (VANCOCIN) 1,250 mg in sodium chloride 0.9 % 250 mL IVPB     1,250 mg 166.7 mL/hr over 90 Minutes Intravenous  Once 08/03/13 0440 08/03/13 0646   08/03/13 0500  piperacillin-tazobactam (ZOSYN) IVPB 3.375 g  Status:  Discontinued     3.375 g 12.5 mL/hr over 240 Minutes Intravenous 3 times per day 08/03/13 0440 08/07/13 0921   08/01/13 1245  metroNIDAZOLE (FLAGYL) IVPB 500 mg     500 mg 100 mL/hr over 60 Minutes Intravenous To Surgery 08/01/13 1238 08/01/13 1300   07/31/13 0800  cefoTEtan (CEFOTAN) 2 g in dextrose 5 % 50 mL IVPB     2 g 100 mL/hr over 30 Minutes Intravenous On call to O.R. 07/31/13 0741 08/01/13 0559   07/31/13 0800  cefTRIAXone (ROCEPHIN) 2 g in dextrose  5 % 50 mL IVPB  Status:  Discontinued    Comments:  Pharmacy may adjust dosing strength / duration / interval for maximal efficacy   2 g 100 mL/hr over 30 Minutes Intravenous Every 24 hours 07/31/13 0744 08/03/13 0405   07/29/13 2100  ciprofloxacin (CIPRO) IVPB 400 mg  Status:  Discontinued     400 mg 200 mL/hr over 60 Minutes Intravenous Every 12 hours 07/29/13 2058 07/31/13 0744   07/29/13 2100  metroNIDAZOLE (FLAGYL) IVPB 500 mg  Status:  Discontinued     500 mg 100 mL/hr over 60 Minutes Intravenous Every 8 hours 07/29/13 2058 08/03/13 0405   07/29/13 2100  fluconazole (DIFLUCAN) IVPB 200 mg  Status:  Discontinued     200 mg 100 mL/hr over 60 Minutes Intravenous Every 24 hours 07/29/13 2058 08/04/13 0911   07/29/13 1930  ciprofloxacin (CIPRO) IVPB 400 mg     400 mg 200 mL/hr over 60 Minutes Intravenous  Once 07/29/13 1915 07/29/13 2031   07/29/13 1930  metroNIDAZOLE (FLAGYL) IVPB 500 mg  Status:  Discontinued     500 mg 100 mL/hr over 60 Minutes Intravenous  Once 07/29/13 1915 07/29/13 2107      Assessment/Plan Colon obstruction POD #24 s/p Ex lap, partial colectomy, end colostomy with Hartmanns procedure, SBR, repair of multiple  enterotomies, and LOA  Acute respiratory failure post op s/p trach on 7/25  Post op wound dehiscence  PCM  Inability to take PO - resolved  Dysphagia - resolved   Plan:  Nutrition consult to ensure adequate PO intake Pain control Abdominal binder at all times BID wet to dry dressing changes Stable for discharge from surgical standpoint when medically ready    Erby Pian, [facility] Pager [phone] Office [phone]  08/25/2013 8:14 AM

## 2013-08-25 NOTE — Discharge Instructions (Signed)
Continue NS WD dressing changes to midline wound BID.  Keep abdominal binder in place for wound dehiscence   [facility], [place] [phone]  OPEN ABDOMINAL SURGERY: POST OP INSTRUCTIONS  Always review your discharge instruction sheet given to you by the facility where your surgery was performed.  IF YOU HAVE DISABILITY OR FAMILY LEAVE FORMS, YOU MUST BRING THEM TO THE OFFICE FOR PROCESSING.  PLEASE DO NOT GIVE THEM TO YOUR DOCTOR.  1. A prescription for pain medication may be given to you upon discharge.  Take your pain medication as prescribed, if needed.  If narcotic pain medicine is not needed, then you may take acetaminophen (Tylenol) or ibuprofen (Advil) as needed. 2. Take your usually prescribed medications unless otherwise directed. 3. If you need a refill on your pain medication, please contact your pharmacy. They will contact our office to request authorization.  Prescriptions will not be filled after 5pm or on week-ends. 4. You should follow a light diet the first few days after arrival home, such as soup and crackers, pudding, etc.unless your doctor has advised otherwise. A high-fiber, low fat diet can be resumed as tolerated.   Be sure to include lots of fluids daily. Most patients will experience some swelling and bruising on the chest and neck area.  Ice packs will help.  Swelling and bruising can take several days to resolve 5. Most patients will experience some swelling and bruising in the area of the incision. Ice pack will help. Swelling and bruising can take several days to resolve..  6. It is common to experience some constipation if taking pain medication after surgery.  Increasing fluid intake and taking a stool softener will usually help or prevent this problem from occurring.  A mild laxative (Milk of Magnesia or Miralax) should be taken according to package directions if there are no bowel movements after 48 hours. 7.  You may have steri-strips (small  skin tapes) in place directly over the incision.  These strips should be left on the skin for 7-10 days.  If your surgeon used skin glue on the incision, you may shower in 24 hours.  The glue will flake off over the next 2-3 weeks.  Any sutures or staples will be removed at the office during your follow-up visit. You may find that a light gauze bandage over your incision may keep your staples from being rubbed or pulled. You may shower and replace the bandage daily. 8. ACTIVITIES:  You may resume regular (light) daily activities beginning the next day--such as daily self-care, walking, climbing stairs--gradually increasing activities as tolerated.  You may have sexual intercourse when it is comfortable.  Refrain from any heavy lifting or straining until approved by your doctor. a. You may drive when you no longer are taking prescription pain medication, you can comfortably wear a seatbelt, and you can safely maneuver your car and apply brakes b. Return to Work: ___________________________________ 63. You should see your doctor in the office for a follow-up appointment approximately two weeks after your surgery.  Make sure that you call for this appointment within a day or two after you arrive home to insure a convenient appointment time. OTHER INSTRUCTIONS:  _____________________________________________________________ _____________________________________________________________  WHEN TO CALL YOUR DOCTOR: 1. Fever over 101.0 2. Inability to urinate 3. Nausea and/or vomiting 4. Extreme swelling or bruising 5. Continued bleeding from incision. 6. Increased pain, redness, or drainage from the incision. 7. Difficulty swallowing or breathing 8. Muscle cramping  or spasms. 9. Numbness or tingling in hands or feet or around lips.  The clinic staff is available to answer your questions during regular business hours.  Please dont hesitate to call and ask to speak to one of the nurses if you have  concerns.  For further questions, please visit [email or web address]

## 2013-08-26 LAB — BASIC METABOLIC PANEL
Anion gap: 10 (ref 5–15)
BUN: 4 mg/dL — AB (ref 6–23)
CO2: 25 meq/L (ref 19–32)
Calcium: 8.5 mg/dL (ref 8.4–10.5)
Chloride: 106 mEq/L (ref 96–112)
Creatinine, Ser: 0.41 mg/dL — ABNORMAL LOW (ref 0.50–1.10)
GFR calc Af Amer: >90 mL/min (ref >90)
GFR calc non Af Amer: >90 mL/min (ref >90)
GLUCOSE: 92 mg/dL (ref 70–99)
POTASSIUM: 3.9 meq/L (ref 3.7–5.3)
Sodium: 141 mEq/L (ref 137–147)

## 2013-08-26 LAB — URINE CULTURE: Colony Count: 50000

## 2013-08-26 LAB — GLUCOSE, CAPILLARY
GLUCOSE-CAPILLARY: 86 mg/dL (ref 70–99)
Glucose-Capillary: 85 mg/dL (ref 70–99)
Glucose-Capillary: 94 mg/dL (ref 70–99)
Glucose-Capillary: 96 mg/dL (ref 70–99)

## 2013-08-26 MED ORDER — ACETAMINOPHEN 325 MG PO TABS: 650.0000 mg | ORAL_TABLET | Freq: Four times a day (QID) | ORAL | Status: DC | PRN

## 2013-08-26 MED ORDER — BLISTEX MEDICATED EX OINT: 1.0000 "application " | TOPICAL_OINTMENT | Freq: Two times a day (BID) | CUTANEOUS | Status: DC

## 2013-08-26 MED ORDER — OXYCODONE HCL 5 MG PO CAPS: ORAL_CAPSULE | ORAL | Status: DC

## 2013-08-26 MED ORDER — ENSURE COMPLETE PO LIQD: 237.0000 mL | Freq: Two times a day (BID) | ORAL | Status: DC

## 2013-08-26 NOTE — Clinical Social Work Note (Signed)
Patient has been discharged  today to SNF bed at [facility] which has been approved by insurance. Family and patient agreeable to this plan- plan transfer via EMS later this afternoon. Patient is pleased and relieved to be leaving and beginning her rehabilitation. Support and encouragement provided.  Eduard Clos, MSW, [place]

## 2013-08-26 NOTE — Progress Notes (Signed)
Pt is for discharge to [facility] today, CVL was removed no bleeding noted,vital signs stable, report given Erline Levine staff of [facility].

## 2013-08-26 NOTE — Progress Notes (Signed)
Patient ID: [name], female   DOB: [birth year], 73 y.o.   MRN: [id number]     [facility]      [address] [place], [place] [address]    Phone: [phone] FAX: [phone]     Subjective: Some pain, alleviated with pain meds.  Appetite is fair.  Ostomy functioning.  Afebrile.  Lurline Idol is out. Adequate sats on room air.   Objective:  Vital signs:  Filed Vitals:   08/26/13 0400 08/26/13 0419 08/26/13 0735 08/26/13 0802  BP: 102/50   105/43  Pulse: 90   85  Temp: 98.2 F (36.8 C)   97.4 F (36.3 C)  TempSrc: Oral   Oral  Resp: $Remo'17 23  23  'XUHbT$ Height:      Weight:      SpO2: 98% 95% 98% 100%    Last BM Date: 08/26/13  Intake/Output   Yesterday:  08/06 0701 - 08/07 0700 In: 1160 [P.O.:1160] Out: 2880 [Urine:2600; Stool:280] This shift:    I/O last 3 completed shifts: In: 1280 [P.O.:1280] Out: 3580 [Urine:3300; Stool:280]    Physical Exam:  General: Pt awake/alert/oriented x4 in no acute distress  Abdomen: Soft. Nondistended.midline wound some fibrinous exudate, otherwise beefy red, sutures are visible, no visible bowel, repacked and abdominal binder was packed. Left ostomy is viable with adequate output. No evidence of peritonitis. No incarcerated hernias.    Problem List:   Active Problems:   CAD (coronary artery disease)   Depression   Barrett's esophagus   Colonic obstruction due to suspected colitis; s/p colectomy/colostomy   Protein-calorie malnutrition, severe w/ electrolyte imbalance   Anasarca   Acute respiratory failure with hypoxia   Tracheostomy status   Physical deconditioning   HTN (hypertension)   Dehydration with hypernatremia   DVT of axillary vein, acute right   Anemia    Results:   Labs: Results for orders placed during the hospital encounter of 07/29/13 (from the past 31 hour(s))  URINE CULTURE     Status: None   Collection Time    08/24/13 11:38 AM      Result Value Ref  Range   Specimen Description URINE, CATHETERIZED     Special Requests NONE     Culture  Setup Time       Value: 08/24/2013 12:10     Performed at [facility] PENDING     Culture       Value: Culture reincubated for better growth     Performed at [facility]   Report Status PENDING    URINALYSIS, ROUTINE W REFLEX MICROSCOPIC     Status: None   Collection Time    08/24/13 11:38 AM      Result Value Ref Range   Color, Urine YELLOW  YELLOW   APPearance CLEAR  CLEAR   Specific Gravity, Urine 1.012  1.005 - 1.030   pH 6.5  5.0 - 8.0   Glucose, UA NEGATIVE  NEGATIVE mg/dL   Hgb urine dipstick NEGATIVE  NEGATIVE   Bilirubin Urine NEGATIVE  NEGATIVE   Ketones, ur NEGATIVE  NEGATIVE mg/dL   Protein, ur NEGATIVE  NEGATIVE mg/dL   Urobilinogen, UA 0.2  0.0 - 1.0 mg/dL   Nitrite NEGATIVE  NEGATIVE   Leukocytes, UA NEGATIVE  NEGATIVE   Comment: MICROSCOPIC NOT DONE ON URINES WITH NEGATIVE PROTEIN, BLOOD, LEUKOCYTES, NITRITE, OR GLUCOSE <1000 mg/dL.  GLUCOSE, CAPILLARY     Status: Abnormal  Collection Time    08/24/13 12:19 PM      Result Value Ref Range   Glucose-Capillary 124 (*) 70 - 99 mg/dL  CLOSTRIDIUM DIFFICILE BY PCR     Status: None   Collection Time    08/24/13  3:08 PM      Result Value Ref Range   C difficile by pcr NEGATIVE  NEGATIVE  GLUCOSE, CAPILLARY     Status: Abnormal   Collection Time    08/24/13  4:44 PM      Result Value Ref Range   Glucose-Capillary 103 (*) 70 - 99 mg/dL   Comment 1 Notify RN    GLUCOSE, CAPILLARY     Status: Abnormal   Collection Time    08/24/13  7:45 PM      Result Value Ref Range   Glucose-Capillary 107 (*) 70 - 99 mg/dL  GLUCOSE, CAPILLARY     Status: Abnormal   Collection Time    08/24/13 11:49 PM      Result Value Ref Range   Glucose-Capillary 108 (*) 70 - 99 mg/dL   Comment 1 Notify RN    GLUCOSE, CAPILLARY     Status: None   Collection Time    08/25/13  4:34 AM      Result Value Ref Range    Glucose-Capillary 96  70 - 99 mg/dL  BASIC METABOLIC PANEL     Status: Abnormal   Collection Time    08/25/13  4:40 AM      Result Value Ref Range   Sodium 144  137 - 147 mEq/L   Potassium 4.0  3.7 - 5.3 mEq/L   Chloride 108  96 - 112 mEq/L   CO2 26  19 - 32 mEq/L   Glucose, Bld 94  70 - 99 mg/dL   BUN 6  6 - 23 mg/dL   Creatinine, Ser 0.40 (*) 0.50 - 1.10 mg/dL   Calcium 8.5  8.4 - 10.5 mg/dL   GFR calc non Af Amer >90  >90 mL/min   GFR calc Af Amer >90  >90 mL/min   Comment: (NOTE)     The eGFR has been calculated using the CKD EPI equation.     This calculation has not been validated in all clinical situations.     eGFR's persistently <90 mL/min signify possible Chronic Kidney     Disease.   Anion gap 10  5 - 15  CBC     Status: Abnormal   Collection Time    08/25/13  4:40 AM      Result Value Ref Range   WBC 4.0  4.0 - 10.5 K/uL   RBC 2.60 (*) 3.87 - 5.11 MIL/uL   Hemoglobin 7.6 (*) 12.0 - 15.0 g/dL   HCT 24.1 (*) 36.0 - 46.0 %   MCV 92.7  78.0 - 100.0 fL   MCH 29.2  26.0 - 34.0 pg   MCHC 31.5  30.0 - 36.0 g/dL   RDW 18.2 (*) 11.5 - 15.5 %   Platelets 177  150 - 400 K/uL  MAGNESIUM     Status: None   Collection Time    08/25/13  4:40 AM      Result Value Ref Range   Magnesium 2.0  1.5 - 2.5 mg/dL  GLUCOSE, CAPILLARY     Status: Abnormal   Collection Time    08/25/13  7:52 AM      Result Value Ref Range   Glucose-Capillary 109 (*) 70 - 99  mg/dL  GLUCOSE, CAPILLARY     Status: None   Collection Time    08/25/13 11:46 AM      Result Value Ref Range   Glucose-Capillary 90  70 - 99 mg/dL  GLUCOSE, CAPILLARY     Status: Abnormal   Collection Time    08/25/13  4:16 PM      Result Value Ref Range   Glucose-Capillary 133 (*) 70 - 99 mg/dL  GLUCOSE, CAPILLARY     Status: None   Collection Time    08/25/13  8:07 PM      Result Value Ref Range   Glucose-Capillary 77  70 - 99 mg/dL  GLUCOSE, CAPILLARY     Status: None   Collection Time    08/26/13 12:21 AM       Result Value Ref Range   Glucose-Capillary 94  70 - 99 mg/dL  BASIC METABOLIC PANEL     Status: Abnormal   Collection Time    08/26/13  5:40 AM      Result Value Ref Range   Sodium 141  137 - 147 mEq/L   Potassium 3.9  3.7 - 5.3 mEq/L   Chloride 106  96 - 112 mEq/L   CO2 25  19 - 32 mEq/L   Glucose, Bld 92  70 - 99 mg/dL   BUN 4 (*) 6 - 23 mg/dL   Creatinine, Ser 0.41 (*) 0.50 - 1.10 mg/dL   Calcium 8.5  8.4 - 10.5 mg/dL   GFR calc non Af Amer >90  >90 mL/min   GFR calc Af Amer >90  >90 mL/min   Comment: (NOTE)     The eGFR has been calculated using the CKD EPI equation.     This calculation has not been validated in all clinical situations.     eGFR's persistently <90 mL/min signify possible Chronic Kidney     Disease.   Anion gap 10  5 - 15  GLUCOSE, CAPILLARY     Status: None   Collection Time    08/26/13  5:44 AM      Result Value Ref Range   Glucose-Capillary 85  70 - 99 mg/dL    Imaging / Studies: No results found.  Medications / Allergies:  Scheduled Meds: . antiseptic oral rinse  15 mL Mouth Rinse QID  . chlorhexidine  15 mL Mouth Rinse BID  . feeding supplement (ENSURE COMPLETE)  237 mL Oral BID BM  . insulin aspart  0-20 Units Subcutaneous 6 times per day  . lip balm  1 application Topical BID  . pantoprazole  40 mg Oral Daily   Continuous Infusions:  PRN Meds:.acetaminophen, diphenhydrAMINE, ipratropium-albuterol, metoprolol, ondansetron (ZOFRAN) IV, oxyCODONE, sodium chloride  Antibiotics: Anti-infectives   Start     Dose/Rate Route Frequency Ordered Stop   08/13/13 1600  vancomycin (VANCOCIN) IVPB 1000 mg/200 mL premix  Status:  Discontinued     1,000 mg 200 mL/hr over 60 Minutes Intravenous Every 12 hours 08/13/13 1435 08/16/13 1243   08/11/13 1400  vancomycin (VANCOCIN) IVPB 750 mg/150 ml premix  Status:  Discontinued     750 mg 150 mL/hr over 60 Minutes Intravenous Every 12 hours 08/11/13 1138 08/13/13 1435   08/11/13 1300  imipenem-cilastatin  (PRIMAXIN) 500 mg in sodium chloride 0.9 % 100 mL IVPB  Status:  Discontinued     500 mg 200 mL/hr over 30 Minutes Intravenous 3 times per day 08/11/13 1138 08/17/13 1055   08/11/13 1230  micafungin (MYCAMINE) 100  mg in sodium chloride 0.9 % 100 mL IVPB  Status:  Discontinued     100 mg 100 mL/hr over 1 Hours Intravenous Daily 08/11/13 1138 08/12/13 1132   08/07/13 1400  piperacillin-tazobactam (ZOSYN) IVPB 3.375 g     3.375 g 12.5 mL/hr over 240 Minutes Intravenous 3 times per day 08/07/13 0921 08/10/13 0101   08/03/13 1800  vancomycin (VANCOCIN) IVPB 750 mg/150 ml premix  Status:  Discontinued     750 mg 150 mL/hr over 60 Minutes Intravenous Every 12 hours 08/03/13 0440 08/04/13 0910   08/03/13 0500  vancomycin (VANCOCIN) 1,250 mg in sodium chloride 0.9 % 250 mL IVPB     1,250 mg 166.7 mL/hr over 90 Minutes Intravenous  Once 08/03/13 0440 08/03/13 0646   08/03/13 0500  piperacillin-tazobactam (ZOSYN) IVPB 3.375 g  Status:  Discontinued     3.375 g 12.5 mL/hr over 240 Minutes Intravenous 3 times per day 08/03/13 0440 08/07/13 0921   08/01/13 1245  metroNIDAZOLE (FLAGYL) IVPB 500 mg     500 mg 100 mL/hr over 60 Minutes Intravenous To Surgery 08/01/13 1238 08/01/13 1300   07/31/13 0800  cefoTEtan (CEFOTAN) 2 g in dextrose 5 % 50 mL IVPB     2 g 100 mL/hr over 30 Minutes Intravenous On call to O.R. 07/31/13 0741 08/01/13 0559   07/31/13 0800  cefTRIAXone (ROCEPHIN) 2 g in dextrose 5 % 50 mL IVPB  Status:  Discontinued    Comments:  Pharmacy may adjust dosing strength / duration / interval for maximal efficacy   2 g 100 mL/hr over 30 Minutes Intravenous Every 24 hours 07/31/13 0744 08/03/13 0405   07/29/13 2100  ciprofloxacin (CIPRO) IVPB 400 mg  Status:  Discontinued     400 mg 200 mL/hr over 60 Minutes Intravenous Every 12 hours 07/29/13 2058 07/31/13 0744   07/29/13 2100  metroNIDAZOLE (FLAGYL) IVPB 500 mg  Status:  Discontinued     500 mg 100 mL/hr over 60 Minutes Intravenous Every  8 hours 07/29/13 2058 08/03/13 0405   07/29/13 2100  fluconazole (DIFLUCAN) IVPB 200 mg  Status:  Discontinued     200 mg 100 mL/hr over 60 Minutes Intravenous Every 24 hours 07/29/13 2058 08/04/13 0911   07/29/13 1930  ciprofloxacin (CIPRO) IVPB 400 mg     400 mg 200 mL/hr over 60 Minutes Intravenous  Once 07/29/13 1915 07/29/13 2031   07/29/13 1930  metroNIDAZOLE (FLAGYL) IVPB 500 mg  Status:  Discontinued     500 mg 100 mL/hr over 60 Minutes Intravenous  Once 07/29/13 1915 07/29/13 2107      Assessment/Plan  Colon obstruction POD #25 s/p Ex lap, partial colectomy, end colostomy with Hartmanns procedure, SBR, repair of multiple enterotomies, and LOA  Acute respiratory failure post op s/p trach on 7/25  Post op wound dehiscence  PCM  Inability to take PO - resolved  Dysphagia - resolved   Plan:  Tolerating PO, ensure supplement  Pain control  Abdominal binder at all times  BID wet to dry dressing changes  Stable for discharge from surgical standpoint when medically ready   Erby Pian, [facility] Surgery Pager [phone] Office [phone]  08/26/2013 8:16 AM

## 2013-08-26 NOTE — Progress Notes (Signed)
Discussed with NP Pt discharged by time I rounded Agree with her assessment  Leighton Ruff. Redmond Pulling, MD, FACS General, Bariatric, & Minimally Invasive Surgery [facility] Surgery, [place]

## 2013-08-26 NOTE — Discharge Summary (Signed)
Physician Discharge Summary  MI BALLA QHU:[id number] DOB: 08/14/1940 DOA: 07/29/2013  PCP: Laverna Peace, NP  Admit date: 07/29/2013 Discharge date: 08/26/2013  Time spent: >30 minutes  Recommendations for Outpatient Follow-up:  1. Discharge to SNF for continued rehabilitation 2. After discharge from SNF need to arrange follow up appointment with Dr. Ninfa Linden of [facility] 3. Continue current wound care to abdomen 4. Issues with urinary retention so will dc to SNF with foley catheter in place w/ plans to reattempt voiding trials at SNF   Discharge Diagnoses:    Colonic obstruction due to suspected colitis; s/p colectomy/colostomy   Anasarca-resolving   Acute respiratory failure with hypoxia/Tracheostomy status-resolved and decannulated   Dehydration with hypernatremia-resolved   Protein-calorie malnutrition, severe w/ electrolyte imbalance-improving   HTN (hypertension)   DVT of axillary vein, acute right not on anticoagulation due to GI Bleed and small cerebral bleed   Anemia-hgb stable   CAD (coronary artery disease)   Depression   Barrett's esophagus   Physical deconditioning   Discharge Condition: stable  Diet recommendation: Dysphagia 3 with thin liquids  Filed Weights   08/17/13 0404 08/23/13 0436 08/24/13 0420  Weight: 160 lb 4.4 oz (72.7 kg) 160 lb 4.4 oz (72.7 kg) 158 lb 11.7 oz (72 kg)    History of present illness:  72 y.o. female who presented with a 2 week history of increased abdominal pain with constipation and nausea but no vomiting.   Her history started on April 10 when she had lysis of adhesions and ovarian cystectomy for small bowel obstruction. Her family reported that she had a complication of anesthesia during the surgery. She returned to the hospital few days later with abdominal pain that was diagnosed as colitis and she was discharged home on Levaquin and Flagyl. Her pain improved slightly but she started having abdominal pain  again and was admitted to [facility] and was continued on antibiotics with no surgical intervention. Gastroenterology followed the patient and did not recommend aggressive intervention so she was discharged home. She started having worsening of her abdominal pain over the follwing 2 weeks associated with nausea but no vomiting. She reported severe constipation and had not had a bowel movement for 1 week.    She presented to the ER on 7/10 because of the above symptoms. CT of the abdomen was done and showed obstruction at the level of the colon with small bowel distention. Surgery was consulted. She went to the OR for repair 7/13 and remained on the vent immediately post-op.  Hospital Course:   Colonic obstruction due to suspected colitis; s/p partial colectomy/colostomy  -per Gen Surgery  -issues with wound dehiscence post op so requiring abd binder  -colostomy functional with pink stoma -per Surgery team stable for d/c  -continue wet to dry NS dressings to the abdomen every 8 hours  Urinary retention  -Foley dc'd 8/4 but since has required I/O cath for retained urine > 600 so reinserted foley on 8/6  -UA/cx after the foley was reinserted was negative  -need to re-evaluate for removal after dc to SNF  Anasarca  -due to malnutrition and critical illness-slowly resolving   Acute respiratory failure with hypoxia / Tracheostomy  -initial etiology unclear but differential included ARDS, HCAP  -pulmonary edema from 3rd spacing in setting of hypoalbunemia has resolved clinically  -PCCM downsized trach to #4 cuffless and 8/5 plugged trach and she was subsequently decannulated on 8/6 without incident  Dehydration with hypernatremia  -resolved  Protein-calorie malnutrition, severe /dysphagia  -8/4 SLP recommended D3 diet with thin liquids after MBS -continue after discharge  Hypokalemia  -resolved  HTN (hypertension)  -BP remains soft so home Lopressor remains on hold  DVT of  axillary vein, acute right  -etiology likely due to Right IJ central line that had been inserted on 7/14-this line was dc'd on date of dc -Heparin on hold due to recent issues with GIB sx's  -no edema right arm on day of dc  Very small cerebral bleed  -tiny SAH vs SDH seen on CT head from 7/23 - completed due to altered mentation  -would avoid anticoagulation for now (including low dose ASA) -consider resume in next 4 weeks  Anemia  -baseline Hgb 11  -currently stable averaging between 7.5 and 8.6 -suspect etiology due to recent critical illness as well as transient GIB   CAD (coronary artery disease) -resume statin -beta blocker on hold due to suboptimal BP -ASA on hold due to issues of GIB and small cerebral bleed this admit   Depression -resume Zoloft   Barrett's esophagus  -cont Protonix  Physical deconditioning  -CIR denied by insurance company so will go to SNF now that medically stable  Procedures: 7/13 - Laparotomy, partial colectomy, colostomy, SB resection, repair of enterotomy, lysis of adhesions   7/15 - 2D echo>>> severely dilated LA, normal systolic function  9/98 - Venous Duplex  >>+DVT Rt axillary vein  7/24 - s/p bedside trach (DF) / bronchoscopy  8/6   - Decannulated  Consultations: Dr. Simonne Maffucci (PCCM.)  Dr. Judeth Horn (surgery)-operative surgeon Dr. Coralie Keens   Discharge Exam: Filed Vitals:   08/26/13 0802  BP: 105/43  Pulse: 85  Temp: 97.4 F (36.3 C)  Resp: 23    Gen: No acute respiratory distress  Chest: Clear to auscultation without rhonchi or wheezes, now decannulated, RA  Cardiac: Regular rate and rhythm, no rubs murmurs or gallops, no peripheral edema  Abdomen: Soft with expected tenderness over surgical incision, nondistended without obvious hepatosplenomegaly, colostomy with pink stoma Extremities: Symmetrical in appearance without cyanosis, clubbing or effusion      Discharge Instructions   Change dressing  (specify)    Complete by:  As directed   Dressing change: TID- use wet to dry dressings     Diet general    Complete by:  As directed   Dysphagia 3 with thin liquids     Increase activity slowly    Complete by:  As directed   Continue PT/OT and other methods of physical rehabilitation            Medication List    STOP taking these medications       aspirin EC 81 MG tablet     clopidogrel 75 MG tablet  Commonly known as:  PLAVIX     dicyclomine 10 MG capsule  Commonly known as:  BENTYL     diphenhydramine-acetaminophen 25-500 MG Tabs  Commonly known as:  TYLENOL PM     metoprolol succinate 25 MG 24 hr tablet  Commonly known as:  TOPROL-XL     oxyCODONE-acetaminophen 5-325 MG per tablet  Commonly known as:  PERCOCET/ROXICET      TAKE these medications       acetaminophen 325 MG tablet  Commonly known as:  TYLENOL  Take 2 tablets (650 mg total) by mouth every 6 (six) hours as needed for mild pain, fever or headache.     atorvastatin 80 MG tablet  Commonly known  as:  LIPITOR  Take 80 mg by mouth every evening.     feeding supplement (ENSURE COMPLETE) Liqd  Take 237 mLs by mouth 2 (two) times daily between meals.     fish oil-omega-3 fatty acids 1000 MG capsule  Take 1,000 mg by mouth daily.     folic acid 284 MCG tablet  Commonly known as:  FOLVITE  Take 800 mcg by mouth daily.     ipratropium-albuterol 0.5-2.5 (3) MG/3ML Soln  Commonly known as:  DUONEB  Take 3 mLs by nebulization every 6 (six) hours as needed (for shortness of breath).     lip balm Oint  Apply 1 application topically 2 (two) times daily.     nitroGLYCERIN 0.4 MG SL tablet  Commonly known as:  NITROSTAT  Place 1 tablet (0.4 mg total) under the tongue every 5 (five) minutes as needed for chest pain.     ondansetron 4 MG tablet  Commonly known as:  ZOFRAN  Take 4 mg by mouth every 8 (eight) hours as needed for nausea or vomiting.     oxycodone 5 MG capsule  Commonly known as:  OXY-IR   TAKE 1-2 TABS EVERY 4 HOURS AS NEEDED FOR PAIN     pantoprazole 40 MG tablet  Commonly known as:  PROTONIX  Take 40 mg by mouth daily.     sertraline 100 MG tablet  Commonly known as:  ZOLOFT  Take 200 mg by mouth daily.     traMADol 50 MG tablet  Commonly known as:  ULTRAM  Take 50 mg by mouth every 6 (six) hours as needed for moderate pain.       Allergies  Allergen Reactions  . Pregabalin Swelling    Tongue swelling  . Sulfonamide Derivatives Swelling    Childhood reaction   Follow-up Information   Follow up with Harl Bowie, MD On 09/13/2013. (2:10pm, arrive at 1:55pm)    Specialty:  General Surgery   Contact information:   [address] [place] [address] [phone]      Microbiology: Recent Results (from the past 240 hour(s))  URINE CULTURE     Status: None   Collection Time    08/24/13 11:38 AM      Result Value Ref Range Status   Specimen Description URINE, CATHETERIZED   Final   Special Requests NONE   Final   Culture  Setup Time     Final   Value: 08/24/2013 12:10     Performed at [place]     Final   Value: 50,000 COLONIES/ML     Performed at [facility]   Culture     Final   Value: Multiple bacterial morphotypes present, none predominant. Suggest appropriate recollection if clinically indicated.     Performed at [facility]   Report Status 08/26/2013 FINAL   Final  CLOSTRIDIUM DIFFICILE BY PCR     Status: None   Collection Time    08/24/13  3:08 PM      Result Value Ref Range Status   C difficile by pcr NEGATIVE  NEGATIVE Final     Labs: Basic Metabolic Panel:  Recent Labs Lab 08/21/13 0226 08/23/13 0500 08/23/13 1000 08/24/13 0438 08/25/13 0440 08/26/13 0540  NA 136* 135*  --  143 144 141  K 3.6* 3.0* 4.1 3.5* 4.0 3.9  CL 102 97  --  105 108 106  CO2 24 27  --  28  26 25  GLUCOSE 118* 143*  --  91 94 92  BUN 21 18  --  11 6 4*  CREATININE 0.32* 0.34*  --  0.44*  0.40* 0.41*  CALCIUM 7.9* 8.0*  --  8.3* 8.5 8.5  MG 1.4*  --  1.5  --  2.0  --    Liver Function Tests:  Recent Labs Lab 08/20/13 0501 08/21/13 0226 08/24/13 0438  AST 18 24 24   ALT 40* 40* 39*  ALKPHOS 81 82 116  BILITOT 0.5 0.5 0.3  PROT 4.8* 4.5* 4.9*  ALBUMIN 2.1* 2.0* 2.0*   CBC:  Recent Labs Lab 08/23/13 0500 08/25/13 0440  WBC 7.8 4.0  HGB 7.5* 7.6*  HCT 23.1* 24.1*  MCV 89.9 92.7  PLT 143* 177   CBG:  Recent Labs Lab 08/25/13 1616 08/25/13 2007 08/26/13 0021 08/26/13 0544 08/26/13 0800  GLUCAP 133* 77 94 85 96   Signed:  ELLIS,ALLISON L. ANP [facility] 08/26/2013, 11:17 AM   I have personally examined this patient and reviewed the entire database. I have reviewed the above note, made any necessary editorial changes, and agree with its content.  Cherene Altes, MD [facility]

## 2013-08-26 NOTE — Progress Notes (Addendum)
Stoma ck done. Pt has bandage with tape over stoma. Pt can talk, pt states she is doing well. Vitals sats 98% on room air, hr 87, rr 21, no distress noted. Pt has emergency equipment at bedside.

## 2013-08-26 NOTE — Progress Notes (Addendum)
Occupational Therapy Treatment Patient Details Name: [name] MRN: [id number] DOB: 1940-11-06 Today's Date: 08/26/2013    History of present illness Pt admit with colonic obstruction.  SB resection with colostomy.  VDRF and trach.  08/16/13 abdominal wound beginning to dehice.  Abdominal binder ordered.    OT comments  Pt with significant improvement with bed mobility and functional mobility   Follow Up Recommendations  CIR    Equipment Recommendations  None recommended by OT    Recommendations for Other Services      Precautions / Restrictions Precautions Precautions: Fall Precaution Comments: Pt is decannulated       Mobility Bed Mobility Overal bed mobility: Needs Assistance Bed Mobility: Rolling;Sidelying to Sit Rolling: Min guard (with use of rails) Sidelying to sit: Min assist;HOB elevated       General bed mobility comments: Increased time.  min A to lift trunk   Transfers Overall transfer level: Needs assistance Equipment used: 2 person hand held assist Transfers: Sit to/from [facility] Sit to Stand: +2 physical assistance;Min assist Stand pivot transfers: +2 physical assistance;Mod assist       General transfer comment: Pt able to ambulate 4' to recliner with mod A +2    Balance Overall balance assessment: Needs assistance Sitting-balance support: Bilateral upper extremity supported Sitting balance-Leahy Scale: Fair     Standing balance support: Bilateral upper extremity supported Standing balance-Leahy Scale: Poor                     ADL       Grooming: Wash/dry hands;Wash/dry face;Brushing hair;Set up;Sitting                   Toilet Transfer: Moderate assistance;+2 for physical assistance;Ambulation;BSC   Toileting- Clothing Manipulation and Hygiene: Maximal assistance;Sit to/from stand       Functional mobility during ADLs: Moderate assistance;+2 for physical assistance General ADL Comments:  Pts spouse very upset that therapies have not seen her "in 4 days".  Explained frequency to pt and spouse.  Both were insistant initially that she had not been seen by therapies.  Reassurred them that she was seen on 08/23/13 and what was done ([facility] PT/OT).  They then indicated they recall this.   Spouse then states he is upset that MD told them that therapy would come get her in a chair and take her outside and was upset that that had not happened.  Appologized to pt and spouse, and spoke with RN.  Unable to transport pt outside due to telemetry as it will not pick up outside the building.  Explained this to both pt and spouse.  Attempted to engage pt in LB ADLs, but she was too fatigued      Vision                     Perception     Praxis      Cognition   Behavior During Therapy: WFL for tasks assessed/performed Overall Cognitive Status: Impaired/Different from baseline     Current Attention Level: Sustained Memory: Decreased short-term memory  Following Commands: Follows one step commands consistently            Extremity/Trunk Assessment               Exercises     Shoulder Instructions       General Comments      Pertinent Vitals/ Pain        Pt denies  pain   Home Living Family/patient expects to be discharged to:: Skilled nursing facility                                        Prior Functioning/Environment              Frequency Min 3X/week     Progress Toward Goals  OT Goals(current goals can now be found in the care plan section)  Progress towards OT goals: Progressing toward goals  ADL Goals Pt Will Perform Grooming: with min assist;sitting Pt Will Perform Upper Body Bathing: with mod assist;sitting Pt Will Transfer to Toilet: with mod assist;squat pivot transfer;bedside commode;stand pivot transfer Pt/caregiver will Perform Home Exercise Program: Increased strength;Right Upper extremity;Increased  ROM;Independently;With written HEP provided Additional ADL Goal #1: Pt will actively participate in 25 mins therapeutic activity consistently to increase activity tolerance.  Additional ADL Goal #2: Pt will be able to sit EOB with min A x 15 mins in prep for BADLs  Plan Discharge plan remains appropriate    Co-evaluation                 End of Session Equipment Utilized During Treatment: Gait belt   Activity Tolerance Patient limited by fatigue   Patient Left in chair;with call bell/phone within reach;with family/visitor present   Nurse Communication Mobility status        Time: 8185-9093 OT Time Calculation (min): 34 min  Charges: OT General Charges $OT Visit: 1 Procedure OT Treatments $Therapeutic Activity: 23-37 mins  Robben Jagiello M 08/26/2013, 12:40 PM

## 2013-08-26 NOTE — Clinical Social Work Note (Signed)
CSW noted trach has been removed- per [facility]; where patient is scheduled to go at discharge, she cannot come there until the trach has been removed for at least 24 hours- CSW will update SNF and await further word from MD on discharge [place], MSW, [place]

## 2013-08-31 ENCOUNTER — Telehealth (INDEPENDENT_AMBULATORY_CARE_PROVIDER_SITE_OTHER): Payer: Self-pay

## 2013-08-31 NOTE — Telephone Encounter (Signed)
Transportation with [facility] ask if appointment with Ninfa Linden to be moved up due to patient having nausea.She could not tell me anything about the patient . Ask for Nurse to assess patient and call with patient symptoms , patient may need to be seen in urg ov if temp or infection . Appointment changed to 09/16/13@ 10:10

## 2013-09-05 LAB — FUNGUS CULTURE W SMEAR

## 2013-09-13 ENCOUNTER — Encounter (INDEPENDENT_AMBULATORY_CARE_PROVIDER_SITE_OTHER): Payer: Medicare PPO | Admitting: Surgery

## 2013-09-16 ENCOUNTER — Ambulatory Visit (INDEPENDENT_AMBULATORY_CARE_PROVIDER_SITE_OTHER): Payer: Commercial Managed Care - HMO | Admitting: Surgery

## 2013-09-16 ENCOUNTER — Telehealth (INDEPENDENT_AMBULATORY_CARE_PROVIDER_SITE_OTHER): Payer: Self-pay

## 2013-09-16 ENCOUNTER — Encounter (INDEPENDENT_AMBULATORY_CARE_PROVIDER_SITE_OTHER): Payer: Self-pay | Admitting: Surgery

## 2013-09-16 VITALS — BP 114/70 | HR 96 | Temp 98.0°F | Ht 60.0 in | Wt 134.0 lb

## 2013-09-16 DIAGNOSIS — Z09 Encounter for follow-up examination after completed treatment for conditions other than malignant neoplasm: Secondary | ICD-10-CM

## 2013-09-16 NOTE — Progress Notes (Signed)
Subjective:     Patient ID: [name], female   DOB: 1940/11/23, 73 y.o.   MRN: [id number]  HPI She is here for a postoperative visit. She underwent export for laparotomy back in July for a stricture of her colon. This is a difficult procedure secondary to lysis of adhesion and excision of small bowels as well secondary to adhesions. She had a prolonged course in the ICU recovering including a tracheostomy. She is now recovered and out of rehabilitation and at home. She is eating well and improving daily with physical therapy. Home health is doing wet-to-dry dressing changes on her abdomen.  Review of Systems     Objective:   Physical Exam On exam, she looks good. I sharply debrided some fibrinous exudate and sutures from the upper portion of her wound.  Her ostomy is pink and well-perfused.  Her abdomen is soft and nontender    Assessment:     Patient stable postop     Plan:     She will continue the current wound care. I will see her back in a month. I do not plan on reversing her colostomy until at least January of 2016.

## 2013-09-16 NOTE — Telephone Encounter (Signed)
Pts called earlier and spoke with [name]. Per Dr Rosendo Gros request pt should go to ER if abd pain continues. See last epic note. Daughter states abd pain has continued. Daughter advised importance of following MD recommendation to take pt to ER since pt has continued. She states she understands and will take pt to ER.

## 2013-09-16 NOTE — Telephone Encounter (Signed)
Pt's daughter calling in b/c pt was seen here today by Dr Ninfa Linden and everything was fine. Pt went home for lunch and ate a big bowl of pinto beans. Pt started having intense abdominal pain and vomited one time. Pt's daughter wants to know if she can take otc Gas X. I advised I would check with one of Dr Trevor Mace partners and call her back.  I did speak to Dr Rosendo Gros and he advised pt that she could take otc Gas X. I advised pt no more pinto's. I advised pt that if she gets worse instead of better with more abdominal pain, bloating, or vomiting pt needs to go to ER. Pt's daughter understands.

## 2013-09-17 ENCOUNTER — Inpatient Hospital Stay (HOSPITAL_COMMUNITY): Payer: Medicare HMO

## 2013-09-17 ENCOUNTER — Encounter (HOSPITAL_COMMUNITY): Payer: Self-pay | Admitting: *Deleted

## 2013-09-17 ENCOUNTER — Inpatient Hospital Stay (HOSPITAL_COMMUNITY)
Admission: EM | Admit: 2013-09-17 | Discharge: 2013-09-19 | DRG: 389 | Disposition: A | Discharge status: Home Health | Payer: Medicare HMO | Source: Other Acute Inpatient Hospital | Attending: Internal Medicine | Admitting: Internal Medicine

## 2013-09-17 DIAGNOSIS — F3289 Other specified depressive episodes: Secondary | ICD-10-CM | POA: Diagnosis present

## 2013-09-17 DIAGNOSIS — K565 Intestinal adhesions [bands], unspecified as to partial versus complete obstruction: Principal | ICD-10-CM | POA: Diagnosis present

## 2013-09-17 DIAGNOSIS — K2270 Barrett's esophagus without dysplasia: Secondary | ICD-10-CM

## 2013-09-17 DIAGNOSIS — D638 Anemia in other chronic diseases classified elsewhere: Secondary | ICD-10-CM | POA: Diagnosis not present

## 2013-09-17 DIAGNOSIS — I739 Peripheral vascular disease, unspecified: Secondary | ICD-10-CM

## 2013-09-17 DIAGNOSIS — Z86718 Personal history of other venous thrombosis and embolism: Secondary | ICD-10-CM | POA: Diagnosis not present

## 2013-09-17 DIAGNOSIS — K5289 Other specified noninfective gastroenteritis and colitis: Secondary | ICD-10-CM | POA: Diagnosis present

## 2013-09-17 DIAGNOSIS — Z9049 Acquired absence of other specified parts of digestive tract: Secondary | ICD-10-CM

## 2013-09-17 DIAGNOSIS — Z9861 Coronary angioplasty status: Secondary | ICD-10-CM | POA: Diagnosis not present

## 2013-09-17 DIAGNOSIS — Z6825 Body mass index (BMI) 25.0-25.9, adult: Secondary | ICD-10-CM

## 2013-09-17 DIAGNOSIS — I2510 Atherosclerotic heart disease of native coronary artery without angina pectoris: Secondary | ICD-10-CM | POA: Diagnosis present

## 2013-09-17 DIAGNOSIS — D649 Anemia, unspecified: Secondary | ICD-10-CM

## 2013-09-17 DIAGNOSIS — K56609 Unspecified intestinal obstruction, unspecified as to partial versus complete obstruction: Secondary | ICD-10-CM | POA: Diagnosis present

## 2013-09-17 DIAGNOSIS — I779 Disorder of arteries and arterioles, unspecified: Secondary | ICD-10-CM

## 2013-09-17 DIAGNOSIS — E870 Hyperosmolality and hypernatremia: Secondary | ICD-10-CM

## 2013-09-17 DIAGNOSIS — F17200 Nicotine dependence, unspecified, uncomplicated: Secondary | ICD-10-CM | POA: Diagnosis not present

## 2013-09-17 DIAGNOSIS — J9601 Acute respiratory failure with hypoxia: Secondary | ICD-10-CM

## 2013-09-17 DIAGNOSIS — F32A Depression, unspecified: Secondary | ICD-10-CM

## 2013-09-17 DIAGNOSIS — K2289 Other specified disease of esophagus: Secondary | ICD-10-CM | POA: Diagnosis present

## 2013-09-17 DIAGNOSIS — R601 Generalized edema: Secondary | ICD-10-CM

## 2013-09-17 DIAGNOSIS — M129 Arthropathy, unspecified: Secondary | ICD-10-CM | POA: Diagnosis present

## 2013-09-17 DIAGNOSIS — E440 Moderate protein-calorie malnutrition: Secondary | ICD-10-CM | POA: Diagnosis not present

## 2013-09-17 DIAGNOSIS — K228 Other specified diseases of esophagus: Secondary | ICD-10-CM | POA: Diagnosis not present

## 2013-09-17 DIAGNOSIS — R5381 Other malaise: Secondary | ICD-10-CM

## 2013-09-17 DIAGNOSIS — F329 Major depressive disorder, single episode, unspecified: Secondary | ICD-10-CM | POA: Diagnosis not present

## 2013-09-17 DIAGNOSIS — Z930 Tracheostomy status: Secondary | ICD-10-CM

## 2013-09-17 DIAGNOSIS — I82A11 Acute embolism and thrombosis of right axillary vein: Secondary | ICD-10-CM

## 2013-09-17 DIAGNOSIS — Z933 Colostomy status: Secondary | ICD-10-CM

## 2013-09-17 DIAGNOSIS — R079 Chest pain, unspecified: Secondary | ICD-10-CM

## 2013-09-17 DIAGNOSIS — I10 Essential (primary) hypertension: Secondary | ICD-10-CM | POA: Diagnosis not present

## 2013-09-17 DIAGNOSIS — I82A19 Acute embolism and thrombosis of unspecified axillary vein: Secondary | ICD-10-CM

## 2013-09-17 DIAGNOSIS — E876 Hypokalemia: Secondary | ICD-10-CM

## 2013-09-17 DIAGNOSIS — E43 Unspecified severe protein-calorie malnutrition: Secondary | ICD-10-CM

## 2013-09-17 DIAGNOSIS — Z90710 Acquired absence of both cervix and uterus: Secondary | ICD-10-CM

## 2013-09-17 DIAGNOSIS — Z9889 Other specified postprocedural states: Secondary | ICD-10-CM

## 2013-09-17 DIAGNOSIS — E785 Hyperlipidemia, unspecified: Secondary | ICD-10-CM

## 2013-09-17 DIAGNOSIS — M1990 Unspecified osteoarthritis, unspecified site: Secondary | ICD-10-CM

## 2013-09-17 DIAGNOSIS — I209 Angina pectoris, unspecified: Secondary | ICD-10-CM

## 2013-09-17 LAB — CBC WITH DIFFERENTIAL/PLATELET
Basophils Absolute: 0 K/uL (ref 0.0–0.1)
Basophils Relative: 0 % (ref 0–1)
Eosinophils Absolute: 0.1 K/uL (ref 0.0–0.7)
Eosinophils Relative: 1 % (ref 0–5)
HCT: 30.4 % — ABNORMAL LOW (ref 36.0–46.0)
Hemoglobin: 9.7 g/dL — ABNORMAL LOW (ref 12.0–15.0)
Lymphocytes Relative: 24 % (ref 12–46)
Lymphs Abs: 1.6 K/uL (ref 0.7–4.0)
MCH: 27.6 pg (ref 26.0–34.0)
MCHC: 31.9 g/dL (ref 30.0–36.0)
MCV: 86.6 fL (ref 78.0–100.0)
Monocytes Absolute: 0.5 K/uL (ref 0.1–1.0)
Monocytes Relative: 8 % (ref 3–12)
Neutro Abs: 4.6 K/uL (ref 1.7–7.7)
Neutrophils Relative %: 68 % (ref 43–77)
Platelets: 278 K/uL (ref 150–400)
RBC: 3.51 MIL/uL — ABNORMAL LOW (ref 3.87–5.11)
RDW: 16.3 % — ABNORMAL HIGH (ref 11.5–15.5)
WBC: 6.8 K/uL (ref 4.0–10.5)

## 2013-09-17 LAB — COMPREHENSIVE METABOLIC PANEL WITH GFR
ALT: 9 U/L (ref 0–35)
AST: 14 U/L (ref 0–37)
Albumin: 2.2 g/dL — ABNORMAL LOW (ref 3.5–5.2)
Alkaline Phosphatase: 108 U/L (ref 39–117)
Anion gap: 12 (ref 5–15)
BUN: 12 mg/dL (ref 6–23)
CO2: 26 meq/L (ref 19–32)
Calcium: 8 mg/dL — ABNORMAL LOW (ref 8.4–10.5)
Chloride: 107 meq/L (ref 96–112)
Creatinine, Ser: 0.47 mg/dL — ABNORMAL LOW (ref 0.50–1.10)
GFR calc Af Amer: >90 mL/min (ref >90)
GFR calc non Af Amer: >90 mL/min (ref >90)
Glucose, Bld: 92 mg/dL (ref 70–99)
Potassium: 3.3 meq/L — ABNORMAL LOW (ref 3.7–5.3)
Sodium: 145 meq/L (ref 137–147)
Total Bilirubin: 0.4 mg/dL (ref 0.3–1.2)
Total Protein: 4.8 g/dL — ABNORMAL LOW (ref 6.0–8.3)

## 2013-09-17 LAB — PROTIME-INR
INR: 1.16 (ref 0.00–1.49)
Prothrombin Time: 14.8 seconds (ref 11.6–15.2)

## 2013-09-17 LAB — MAGNESIUM: Magnesium: 1.2 mg/dL — ABNORMAL LOW (ref 1.5–2.5)

## 2013-09-17 MED ORDER — SODIUM CHLORIDE 0.9 % IJ SOLN
3.0000 mL | Freq: Two times a day (BID) | INTRAMUSCULAR | Status: DC
  Administered 2013-09-17 – 2013-09-19 (×6): 3 mL via INTRAVENOUS

## 2013-09-17 MED ORDER — PANTOPRAZOLE SODIUM 40 MG IV SOLR
40.0000 mg | INTRAVENOUS | Status: DC
  Administered 2013-09-17 – 2013-09-19 (×3): 40 mg via INTRAVENOUS
  Filled 2013-09-17 (×3): qty 40

## 2013-09-17 MED ORDER — ACETAMINOPHEN 650 MG RE SUPP: 650.0000 mg | Freq: Four times a day (QID) | RECTAL | Status: DC | PRN

## 2013-09-17 MED ORDER — CHLORHEXIDINE GLUCONATE 0.12 % MT SOLN
15.0000 mL | Freq: Two times a day (BID) | OROMUCOSAL | Status: DC
  Administered 2013-09-17 – 2013-09-19 (×5): 15 mL via OROMUCOSAL
  Filled 2013-09-17 (×7): qty 15

## 2013-09-17 MED ORDER — SERTRALINE HCL 100 MG PO TABS
200.0000 mg | ORAL_TABLET | Freq: Every day | ORAL | Status: DC
  Administered 2013-09-17: 200 mg via ORAL
  Filled 2013-09-17: qty 2

## 2013-09-17 MED ORDER — IPRATROPIUM-ALBUTEROL 0.5-2.5 (3) MG/3ML IN SOLN: 3.0000 mL | Freq: Four times a day (QID) | RESPIRATORY_TRACT | Status: DC | PRN

## 2013-09-17 MED ORDER — ACETAMINOPHEN 325 MG PO TABS: 650.0000 mg | ORAL_TABLET | Freq: Four times a day (QID) | ORAL | Status: DC | PRN

## 2013-09-17 MED ORDER — ONDANSETRON HCL 4 MG/2ML IJ SOLN
4.0000 mg | Freq: Four times a day (QID) | INTRAMUSCULAR | Status: DC | PRN
  Administered 2013-09-17 – 2013-09-18 (×2): 4 mg via INTRAVENOUS
  Filled 2013-09-17 (×2): qty 2

## 2013-09-17 MED ORDER — PANTOPRAZOLE SODIUM 40 MG IV SOLR
40.0000 mg | INTRAVENOUS | Status: DC
  Administered 2013-09-17: 40 mg via INTRAVENOUS
  Filled 2013-09-17 (×2): qty 40

## 2013-09-17 MED ORDER — MORPHINE SULFATE 2 MG/ML IJ SOLN
2.0000 mg | INTRAMUSCULAR | Status: DC | PRN
  Administered 2013-09-17 – 2013-09-18 (×4): 2 mg via INTRAVENOUS
  Filled 2013-09-17 (×4): qty 1

## 2013-09-17 MED ORDER — NITROGLYCERIN 0.4 MG SL SUBL: 0.4000 mg | SUBLINGUAL_TABLET | SUBLINGUAL | Status: DC | PRN

## 2013-09-17 MED ORDER — BLISTEX MEDICATED EX OINT
1.0000 "application " | TOPICAL_OINTMENT | Freq: Two times a day (BID) | CUTANEOUS | Status: DC
  Administered 2013-09-17 – 2013-09-19 (×6): 1 via TOPICAL
  Filled 2013-09-17: qty 10

## 2013-09-17 MED ORDER — SODIUM CHLORIDE 0.9 % IV SOLN
INTRAVENOUS | Status: DC
  Administered 2013-09-17 – 2013-09-18 (×4): via INTRAVENOUS

## 2013-09-17 MED ORDER — CETYLPYRIDINIUM CHLORIDE 0.05 % MT LIQD
7.0000 mL | Freq: Two times a day (BID) | OROMUCOSAL | Status: DC
  Administered 2013-09-17 – 2013-09-19 (×5): 7 mL via OROMUCOSAL

## 2013-09-17 MED ORDER — PHENOL 1.4 % MT LIQD
1.0000 | OROMUCOSAL | Status: DC | PRN
  Filled 2013-09-17: qty 177

## 2013-09-17 MED ORDER — HEPARIN SODIUM (PORCINE) 5000 UNIT/ML IJ SOLN: 5000.0000 [IU] | Freq: Three times a day (TID) | INTRAMUSCULAR | Status: DC

## 2013-09-17 MED ORDER — ONDANSETRON HCL 4 MG PO TABS: 4.0000 mg | ORAL_TABLET | Freq: Four times a day (QID) | ORAL | Status: DC | PRN

## 2013-09-17 NOTE — Evaluation (Signed)
Physical Therapy Evaluation Patient Details Name: [name] MRN: [id number] DOB: 08/26/1940 Today's Date: 09/17/2013   History of Present Illness  Admitted with nausea and abdominal pain  Clinical Impression  Pt admitted with/for nausea and abdominal pain likely due to bowel obstruction.  Pt currently limited functionally due to the problems listed below.  (see problems list.)  Pt will benefit from PT to maximize function and safety to be able to get home safely with available assist of family.     Follow Up Recommendations Home health PT    Equipment Recommendations  None recommended by PT    Recommendations for Other Services       Precautions / Restrictions Restrictions Weight Bearing Restrictions: No      Mobility  Bed Mobility Overal bed mobility: Needs Assistance Bed Mobility: Sidelying to Sit;Supine to Sit   Sidelying to sit: Supervision (mod use of rail) Supine to sit: Supervision (mod use of rail)     General bed mobility comments: no assist needed  Transfers   Equipment used: Rolling walker (2 wheeled)             General transfer comment: used safe transfer technique appropriately  Ambulation/Gait Ambulation/Gait assistance: Min guard Ambulation Distance (Feet): 240 Feet Assistive device: Rolling walker (2 wheeled) Gait Pattern/deviations: Step-through pattern;Narrow base of support Gait velocity: slower   General Gait Details: narrowed BOS that improved to normalized width as she progressed  Stairs            Wheelchair Mobility    Modified Rankin (Stroke Patients Only)       Balance Overall balance assessment: Needs assistance Sitting-balance support: No upper extremity supported Sitting balance-Leahy Scale: Good     Standing balance support: No upper extremity supported;Bilateral upper extremity supported Standing balance-Leahy Scale: Fair                               Pertinent Vitals/Pain Pain  Assessment: No/denies pain (except where still sore from NG tube)    Home Living Family/patient expects to be discharged to:: Private residence Living Arrangements: Spouse/significant other;Children Available Help at Discharge: Family;Available 24 hours/day Type of Home: House Home Access: Stairs to enter Entrance Stairs-Rails: None Entrance Stairs-Number of Steps: 2 Home Layout: One level Home Equipment: Wheelchair - manual;Walker - standard;Shower seat      Prior Function Level of Independence: Needs assistance               Hand Dominance        Extremity/Trunk Assessment               Lower Extremity Assessment: Overall WFL for tasks assessed;Generalized weakness         Communication      Cognition Arousal/Alertness: Awake/alert Behavior During Therapy: WFL for tasks assessed/performed Overall Cognitive Status: Within Functional Limits for tasks assessed                      General Comments      Exercises        Assessment/Plan    PT Assessment Patient needs continued PT services  PT Diagnosis Abnormality of gait;Generalized weakness   PT Problem List Decreased strength;Decreased activity tolerance;Decreased mobility;Decreased knowledge of use of DME;Cardiopulmonary status limiting activity  PT Treatment Interventions DME instruction;Gait training;Functional mobility training;Therapeutic activities;Patient/family education;Stair training   PT Goals (Current goals can be found in the Care Plan section) Acute Rehab PT  Goals Patient Stated Goal: Get better and back working on therapy PT Goal Formulation: With patient Time For Goal Achievement: 09/24/13 Potential to Achieve Goals: Good    Frequency Min 3X/week   Barriers to discharge        Co-evaluation               End of Session   Activity Tolerance: Patient tolerated treatment well Patient left: in bed;with call bell/phone within reach;with family/visitor  present Nurse Communication: Mobility status         Time: 0352-4818 PT Time Calculation (min): 26 min   Charges:   PT Evaluation $Initial PT Evaluation Tier I: 1 Procedure PT Treatments $Gait Training: 8-22 mins   PT G Codes:          Horris Speros, Tessie Fass 09/17/2013, 3:19 PM 09/17/2013  Donnella Sham, PT [phone]  (pager)

## 2013-09-17 NOTE — Progress Notes (Signed)
Subjective: Patient well-known to our service. On 08/01/2013 she underwent a laparotomy, partial colectomy, colostomy with Hartmann procedure, small bowel resection, repair of multiple enterotomies, and a two-hour lysis of adhesions. Postop course was complicated by ventilator dependent respiratory failure and tracheostomy. She ultimately improved enough to where she could go home. She was seen in the office yesterday morning by Dr. Ninfa Linden and was thought to be doing well. Last month after dinner she began to have episodes of nausea and vomiting and abdominal pain. CT scan performed without contrast in [place] shows some distention of the proximal small bowel and a possible transition point in the mid to distal small bowel. This is not a dramatic transition but there is a caliber change gradually. There was no abscess. She was admitted by the medical service. She continues to pass gas and flatus per colostomy. She denies having a nausea at this time. An NG tube was placed but she insisted that it be removed and that has now been removed.    This morning she has much less abdominal pain. She denies nausea. She is anxious and crying because all that has happened to her, but not the cause of pain or other physical symptoms. Her family is with her.    Comorbidities include coronary artery disease, anemia, hypertension, recent history DVT of right axillary vein, Depression, Barrett's esophagus, history of tracheostomy, now decannulated    Objective: Vital signs in last 24 hours: Temp:  [98.4 F (36.9 C)-98.8 F (37.1 C)] 98.4 F (36.9 C) (08/29 0549) Pulse Rate:  [99-105] 99 (08/29 0549) Resp:  [20] 20 (08/29 0549) BP: (116-128)/(51-54) 116/51 mmHg (08/29 0549) SpO2:  [97 %-100 %] 100 % (08/29 0549) Weight:  [130 lb 4.8 oz (59.104 kg)-130 lb 8.3 oz (59.204 kg)] 130 lb 8.3 oz (59.204 kg) (08/29 0443) Last BM Date: 09/17/13  Intake/Output from previous day: 08/28 0701 - 08/29 0700 In:  186.3 [I.V.:186.3] Out: 400 [Urine:200; Emesis/NG output:200] Intake/Output this shift: Total I/O In: -  Out: 100 [Urine:100]  General appearance: alert. Anxious. Crying.  emotional overlay. No obvious physical distress, however. Resp: clear to auscultation bilaterally GI: abdomen is soft, active bowel sounds, not obviously distended, no significant tenderness. Midline wound open and packed with  a moist dressing. There is some slough at the base of the wound. Fascia is basically intact. No purulent or enteric drainage.Colostomy bag has air and stool in it.  Lab Results:   Recent Labs  09/17/13 0711  WBC 6.8  HGB 9.7*  HCT 30.4*  PLT 278   BMET  Recent Labs  09/17/13 0711  NA 145  K 3.3*  CL 107  CO2 26  GLUCOSE 92  BUN 12  CREATININE 0.47*  CALCIUM 8.0*   PT/INR  Recent Labs  09/17/13 0711  LABPROT 14.8  INR 1.16   ABG No results found for this basename: PHART, PCO2, PO2, HCO3,  in the last 72 hours  Studies/Results: Dg Abd Portable 1v  09/17/2013   CLINICAL DATA:  NG tube placement.  EXAM: PORTABLE ABDOMEN - 1 VIEW  COMPARISON:  09/17/2013  FINDINGS: Enteric tube has been advanced since previous study. Tip is now located in the left upper quadrant consistent with location in the stomach. Proximal side hole remains in the lower chest, probably in the distal esophagus or perhaps in a hiatal hernia. Bowel gas pattern is unchanged. Surgical clips in the right upper quadrant.  IMPRESSION: Enteric tube tip localizes over the upper stomach with proximal side hole  probably in the distal esophagus.   Electronically Signed   By: Lucienne Capers M.D.   On: 09/17/2013 05:49   Dg Abd Portable 2v  09/17/2013   CLINICAL DATA:  NG tube insertion it and followup small bowel obstruction.  EXAM: PORTABLE ABDOMEN - 2 VIEW  COMPARISON:  CT abdomen and pelvis 09/16/2013.  Abdomen 08/17/2013.  FINDINGS: Enteric tube tip is coiled in the lower chest consistent with location in the  distal esophagus or possibly within a hiatal hernia. Stool-filled colon with with mild gaseous distention of left lower quadrant bowel. Left lower quadrant ostomy is present. Residual contrast material in the bladder and renal collecting systems, likely from recent CT scan. Air-fluid levels are demonstrated. No free intra-abdominal air.  IMPRESSION: Persistent changes consistent with small bowel obstruction. Enteric tube tip appears to be coiled in the distal esophagus.   Electronically Signed   By: Lucienne Capers M.D.   On: 09/17/2013 03:50    Anti-infectives: Anti-infectives   None      Assessment/Plan:  Partial SBO versus ileus. No evidence of compromised bowel For now we'll treat with bowel rest and supportive care Lab and x-rays tomorrow Hope this will resolve spontaneously  6 weeks postop complicated laparotomy lysis of adhesions colectomy with colostomy and small bowel resection. 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 No malignancy.  Postop ventilator-dependent respiratory failure with tracheostomy, now decannulated and stable from a pulmonary standpoint  Depression  History DVT right axillary vein  Coronary artery disease  Hypertension  Recent very tiny SAH versus SDH seen on CT in July 23. No deficits on exam.    Will follow with you.    LOS: 0 days    Greidys Deland M 09/17/2013

## 2013-09-17 NOTE — H&P (Signed)
[facility] History and Physical  Patient: [name]  JAS:[id number]  DOB: March 01, 1940  DOS: the patient was seen and examined on 09/17/2013 PCP: Laverna Peace, NP  Chief Complaint: Nausea vomiting abdominal pain  HPI: [name] is a 73 y.o. female with Past medical history of coronary artery disease, carotid artery disease, hypertension, recent admission for small bowel obstruction, Discharge on 41/93/7902 after a complicated prolonged postoperative course after colectomy and colostomy due to bowel obstruction, also had acute hypoxic respiratory failure requiring tracheostomy status post decannulation prior to discharge, DVT of right axillary vein, not anticoagulated due to  intracranial hemorrhage managed conservatively. Patient was discharged to a nursing home.  patient was seen by Dr. Ninfa Linden today for followup and at that point she was doing fine.  she went home and after dinner started having complaints of nausea vomiting and abdominal pain. Went to ER, where CT  abdomen shows small bowel obstruction versus ileus with possible transition point, She Failed by mouth challenge with recurrence of nausea and vomiting Abdominal pain.And therefore NG tube was inserted, general surgery was consulted who recommended the patient to be admitted to medical services and the patient was transferred from [facility] ER to [facility] cone. At the time of my evaluation the patient mentions her nausea has resolved her abdominal pain has improved there is no fever no chills no chest pain no shortness of breath no diarrhea or constipation. Last time she changed her colostomy bag was on Thursday.   The patient is coming from ome. And at her baseline independent for most of her ADL.  Review of Systems: as mentioned in the history of present illness.  A Comprehensive review of the other systems is negative.  Past Medical History  Diagnosis Date  . Carotid artery disease     a. 60-70% bilat ICA  stenosis by dopplers 05/2012.  Marland Kitchen Other and unspecified hyperlipidemia   . CAD (coronary artery disease)     a. Mod dz 2010 initially mgd medically. b. 12/2012 - angina s/p PTCA/DES to mid-circumflex, PTCA/DES to first OM.   Marland Kitchen Hypertension   . Heart murmur   . Depression   . Barrett's esophagus   . Arthritis    Past Surgical History  Procedure Laterality Date  . Appendectomy    . Cholecystectomy    . Colon resection    . 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 Rotator cuff repair Left   . Abdominal hysterectomy    . Colon surgery    . Foot surgery    . Hand surgery    . Laparotomy N/A 08/01/2013    Procedure: EXPLORATORY LAPAROTOMY;  Surgeon: Harl Bowie, MD;  Location: [place];  Service: General;  Laterality: N/A;  . Partial colectomy N/A 08/01/2013    Procedure: PARTIAL COLECTOMY;  Surgeon: Harl Bowie, MD;  Location: [place];  Service: General;  Laterality: N/A;  . Colostomy N/A 08/01/2013    Procedure: COLOSTOMY;  Surgeon: Harl Bowie, MD;  Location: [place];  Service: General;  Laterality: N/A;  . Bowel resection N/A 08/01/2013    Procedure: SMALL BOWEL RESECTION;  Surgeon: Harl Bowie, MD;  Location: [place];  Service: General;  Laterality: N/A;  . Tracheostomy      feinstein   Social History:  reports that she has been smoking Cigarettes.  She has been smoking about 0.00 packs per day.  She has never used smokeless tobacco. She reports that she does not drink alcohol or use illicit drugs.  Allergies  Allergen Reactions  . Pregabalin Swelling    Tongue swelling  . Sulfonamide Derivatives Swelling    Childhood reaction    No family history on file.  Prior to Admission medications   Medication Sig Start Date End Date Taking? Authorizing Provider  acetaminophen (TYLENOL) 325 MG tablet Take 2 tablets (650 mg total) by mouth every 6 (six) hours as needed for mild pain, fever or headache. 08/26/13   Samella Parr, NP  atorvastatin (LIPITOR) 80 MG tablet Take 80 mg by mouth every evening.     Historical Provider, MD  feeding supplement, ENSURE COMPLETE, (ENSURE COMPLETE) LIQD Take 237 mLs by mouth 2 (two) times daily between meals. 08/26/13   Samella Parr, NP  fish oil-omega-3 fatty acids 1000 MG capsule Take 1,000 mg by mouth daily.     Historical Provider, MD  folic acid (FOLVITE) 810 MCG tablet Take 800 mcg by mouth daily.     Historical Provider, MD  ipratropium-albuterol (DUONEB) 0.5-2.5 (3) MG/3ML SOLN Take 3 mLs by nebulization every 6 (six) hours as needed (for shortness of breath).    Historical Provider, MD  lip balm (BLISTEX) OINT Apply 1 application topically 2 (two) times daily. 08/26/13   Samella Parr, NP  nitroGLYCERIN (NITROSTAT) 0.4 MG SL tablet Place 1 tablet (0.4 mg total) under the tongue every 5 (five) minutes as needed for chest pain. 12/13/12   Dorothy Spark, MD  ondansetron (ZOFRAN) 4 MG tablet Take 4 mg by mouth every 8 (eight) hours as needed for nausea or vomiting.    Historical Provider, MD  oxycodone (OXY-IR) 5 MG capsule TAKE 1-2 TABS EVERY 4 HOURS AS NEEDED FOR PAIN 08/26/13   Samella Parr, NP  oxyCODONE-acetaminophen (PERCOCET/ROXICET) 5-325 MG per tablet  09/14/13   Historical Provider, MD  pantoprazole (PROTONIX) 40 MG tablet Take 40 mg by mouth daily.    Historical Provider, MD  sertraline (ZOLOFT) 100 MG tablet Take 200 mg by mouth daily.     Historical Provider, MD  traMADol (ULTRAM) 50 MG tablet Take 50 mg by mouth every 6 (six) hours as needed for moderate pain.     Historical Provider, MD    Physical Exam: Filed Vitals:   09/17/13 0227  BP: 128/54  Pulse: 105  Temp: 98.8 F (37.1 C)  TempSrc: Oral  Resp: 20  Height: 5' (1.524 m)  SpO2: 97%    General: Alert, Awake and Oriented to Time, Place and Person. Appear in mild distress Eyes: PERRL ENT: Oral Mucosa clear moist. Neck: no JVD Cardiovascular: S1 and S2 Present, aortic systolic Murmur,  Peripheral Pulses Present Respiratory: Bilateral Air entry equal and Decreased, Clear to Auscultation, noCrackles, no wheezes Abdomen: Bowel Sound Present, Soft and Non tender Skin: no Rash Extremities: no Pedal edema, o calf tenderness Neurologic: Grossly no focal neuro deficit.  Labs on Admission:  Wbc 15, Hb 11.7 Creat 0.5.  CBC: No results found for this basename: WBC, NEUTROABS, HGB, HCT, MCV, PLT,  in the last 168 hours  CMP     Component Value Date/Time   NA 141 08/26/2013 0540   K 3.9 08/26/2013 0540   CL 106 08/26/2013 0540   CO2 25 08/26/2013 0540   GLUCOSE 92 08/26/2013 0540   BUN 4* 08/26/2013 0540   CREATININE 0.41* 08/26/2013 0540   CALCIUM 8.5 08/26/2013 0540   PROT 4.9*  08/24/2013 0438   ALBUMIN 2.0* 08/24/2013 0438   AST 24 08/24/2013 0438   ALT 39* 08/24/2013 0438   ALKPHOS 116 08/24/2013 0438   BILITOT 0.3 08/24/2013 0438   GFRNONAA >90 08/26/2013 0540   GFRAA >90 08/26/2013 0540    No results found for this basename: LIPASE, AMYLASE,  in the last 168 hours No results found for this basename: AMMONIA,  in the last 168 hours  No results found for this basename: CKTOTAL, CKMB, CKMBINDEX, TROPONINI,  in the last 168 hours BNP (last 3 results)  Recent Labs  08/03/13 0930  PROBNP 3782.0*    Radiological Exams on Admission: No results found.  Assessment/Plan Principal Problem:   SBO (small bowel obstruction) Active Problems:   CAD (coronary artery disease)   Hypertension   Colonic obstruction due to suspected colitis; s/p colectomy/colostomy   H/O tracheostomy   1. SBO (small bowel obstruction) The patient is with complaints of abdominal pain nausea or vomiting. She is found to have small bowel obstruction CT scan. NG tube was reinserted and the patient has been draining greenish fluid. Symptoms have been gradually improving. at present we will consult surgery for further workup patient will be kept n.p.o. and on Zofran will on Protonix. IV fluids will be given. Recheck  her CBC CMP and magnesium level.  2. history of coronary artery disease Intracranial hemorrhage At present holding aspirin in view of intracranial hemorrhage history Also holding Lipitor at present can be resumed once the NG tube is out.  3. Anxiety Continue Zoloft  4. History of respiratory failure and tracheostomy. Continue to monitor at present.  Consults:  surgery   DVT Prophylaxis: MEchanical compression device Nutrition:  NPO  Code Status: DNR/DNI   Family Communication:  family  was present at bedside, opportunity was given to ask question and all questions were answered satisfactorily at the time of interview. Disposition: Admitted to inpatient in med-surge unit.  Author: Berle Mull, MD [facility] Pager: [phone] 09/17/2013, 2:48 AM    If 7PM-7AM, please contact night-coverage [email or web address] Password [email or web address]  **Disclaimer: This note may have been dictated with voice recognition software. Similar sounding words can inadvertently be transcribed and this note may contain transcription errors which may not have been corrected upon publication of note.**

## 2013-09-17 NOTE — Progress Notes (Signed)
PATIENT DETAILS Name: [name] Age: 73 y.o. Sex: female Date of Birth: March 22, 1940 Admit Date: 09/17/2013 Admitting Physician Berle Mull, MD SPQ:ZRAQ,TMA, NP  Subjective: Feels better-less abdominal pain. Colostomy bag with no stool since yesterday.  Assessment/Plan: Principal Problem:   SBO (small bowel obstruction) -likely secondary to adhesions/scar tissue -c/w NPO status, NG tube decompression, other supportive measures -await surgery eval  Active Problems:   CAD (coronary artery disease) -stable-with no chest pain/SOB -resume statin when PO intake resumes -not on beta blocker due soft BP-resume when able -not on ASA because of recent SAH vs SDH  Anemia -secondary to recent critical illness/chronic disease-no evidence of blood loss -monitor Hb periodically  Recent very small SAH vs SDH seen on CT head from 7/23  -no deficits on exam -follow  HTN -previously on lopressor-currently not on any anti-hypertensives-resume when able  Recent hx of DVT of axillary vein, right  -etiology likely due to Right IJ central line that had been inserted on 7/14 -not started on anticoagulation last admit due to GI issues, and small SAH vs SDH seen on CT Head -no edema seen on right arm  Depression  -stable -resume Zoloft when oral intake resumes  Barrett's Esophagus -on PPI  Recent hx of  Colonic obstruction due to suspected colitis; s/p colectomy/colostomy   Recent H/O tracheostomy-now decannulated  Disposition: Remain inpatient  DVT Prophylaxis:  SCD's  Code Status:  DNR  Family Communication Spouse at bedside  Procedures:  None  CONSULTS:  general surgery  Time spent 40 minutes-which includes 50% of the time with face-to-face with patient/ family and coordinating care related to the above assessment and plan.    MEDICATIONS: Scheduled Meds: . antiseptic oral rinse  7 mL Mouth Rinse q12n4p  . chlorhexidine  15 mL Mouth Rinse BID    . lip balm  1 application Topical BID  . pantoprazole (PROTONIX) IV  40 mg Intravenous Q24H  . sertraline  200 mg Oral Daily  . sodium chloride  3 mL Intravenous Q12H   Continuous Infusions: . sodium chloride 75 mL/hr at 09/17/13 0404   PRN Meds:.acetaminophen, acetaminophen, ipratropium-albuterol, morphine injection, nitroGLYCERIN, ondansetron (ZOFRAN) IV, ondansetron  Antibiotics: Anti-infectives   None       PHYSICAL EXAM: Vital signs in last 24 hours: Filed Vitals:   09/17/13 0227 09/17/13 0443 09/17/13 0549  BP: 128/54  116/51  Pulse: 105  99  Temp: 98.8 F (37.1 C)  98.4 F (36.9 C)  TempSrc: Oral  Oral  Resp: 20  20  Height: 5' (1.524 m)    Weight: 59.104 kg (130 lb 4.8 oz) 59.204 kg (130 lb 8.3 oz)   SpO2: 97%  100%    Weight change:  Filed Weights   09/17/13 0227 09/17/13 0443  Weight: 59.104 kg (130 lb 4.8 oz) 59.204 kg (130 lb 8.3 oz)   Body mass index is 25.49 kg/(m^2).   Gen Exam: Awake and alert with clear speech.  NGT in place Neck: Supple, No JVD.   Chest: B/L Clear.   CVS: S1 S2 Regular, no murmurs. Abdomen: soft, BS +, non tender, non distended. Mid line lapartomy wound-packed-dressing dry. Colostomy bag empty Extremities: no edema, lower extremities warm to touch. Neurologic: Non Focal.   Skin: No Rash.   Wounds: N/A.   Intake/Output from previous day:  Intake/Output Summary (Last 24 hours) at 09/17/13 1008 Last data filed at 09/17/13 0820  Gross per 24 hour  Intake 186.25 ml  Output    500 ml  Net -313.75 ml     LAB RESULTS: CBC  Recent Labs Lab 09/17/13 0711  WBC 6.8  HGB 9.7*  HCT 30.4*  PLT 278  MCV 86.6  MCH 27.6  MCHC 31.9  RDW 16.3*  LYMPHSABS 1.6  MONOABS 0.5  EOSABS 0.1  BASOSABS 0.0    Chemistries   Recent Labs Lab 09/17/13 0711  NA 145  K 3.3*  CL 107  CO2 26  GLUCOSE 92  BUN 12  CREATININE 0.47*  CALCIUM 8.0*  MG 1.2*    CBG: No results found for this basename: GLUCAP,  in the last 168  hours  GFR Estimated Creatinine Clearance: 49.7 ml/min (by C-G formula based on Cr of 0.47).  Coagulation profile  Recent Labs Lab 09/17/13 0711  INR 1.16    Cardiac Enzymes No results found for this basename: CK, CKMB, TROPONINI, MYOGLOBIN,  in the last 168 hours  No components found with this basename: POCBNP,  No results found for this basename: DDIMER,  in the last 72 hours No results found for this basename: HGBA1C,  in the last 72 hours No results found for this basename: CHOL, HDL, LDLCALC, TRIG, CHOLHDL, LDLDIRECT,  in the last 72 hours No results found for this basename: TSH, T4TOTAL, FREET3, T3FREE, THYROIDAB,  in the last 72 hours No results found for this basename: VITAMINB12, FOLATE, FERRITIN, TIBC, IRON, RETICCTPCT,  in the last 72 hours No results found for this basename: LIPASE, AMYLASE,  in the last 72 hours  Urine Studies No results found for this basename: UACOL, UAPR, USPG, UPH, UTP, UGL, UKET, UBIL, UHGB, UNIT, UROB, ULEU, UEPI, UWBC, URBC, UBAC, CAST, CRYS, UCOM, BILUA,  in the last 72 hours  MICROBIOLOGY: No results found for this or any previous visit (from the past 240 hour(s)).  RADIOLOGY STUDIES/RESULTS: Dg Chest Port 1 View  08/21/2013   CLINICAL DATA:  Shortness of breath, tracheostomy  EXAM: PORTABLE CHEST - 1 VIEW  COMPARISON:  08/17/2013  FINDINGS: Cardiomegaly with pulmonary vascular congestion and suspected mild interstitial edema. Patchy bilateral lower lobe opacities, possibly atelectasis, left lower lobe pneumonia not excluded. Small bilateral pleural effusions, left greater than right. No pneumothorax.  Tracheostomy in satisfactory position.  Right IJ venous catheter terminates at the cavoatrial junction.  Enteric tube courses into the stomach.  IMPRESSION: Cardiomegaly with mild interstitial edema.  Patchy bilateral lower lobe opacities, possibly atelectasis, left lower lobe not excluded.  Small bilateral pleural effusions, left greater than  right.  Tracheostomy in satisfactory position. Additional support apparatus as above.   Electronically Signed   By: Julian Hy M.D.   On: 08/21/2013 10:37   Dg Abd Portable 1v  09/17/2013   CLINICAL DATA:  NG tube placement.  EXAM: PORTABLE ABDOMEN - 1 VIEW  COMPARISON:  09/17/2013  FINDINGS: Enteric tube has been advanced since previous study. Tip is now located in the left upper quadrant consistent with location in the stomach. Proximal side hole remains in the lower chest, probably in the distal esophagus or perhaps in a hiatal hernia. Bowel gas pattern is unchanged. Surgical clips in the right upper quadrant.  IMPRESSION: Enteric tube tip localizes over the upper stomach with proximal side hole probably in the distal esophagus.   Electronically Signed   By: Lucienne Capers M.D.   On: 09/17/2013 05:49   Dg Abd Portable 2v  09/17/2013   CLINICAL DATA:  NG tube insertion it and followup small bowel obstruction.  EXAM: PORTABLE ABDOMEN - 2 VIEW  COMPARISON:  CT abdomen and pelvis 09/16/2013.  Abdomen 08/17/2013.  FINDINGS: Enteric tube tip is coiled in the lower chest consistent with location in the distal esophagus or possibly within a hiatal hernia. Stool-filled colon with with mild gaseous distention of left lower quadrant bowel. Left lower quadrant ostomy is present. Residual contrast material in the bladder and renal collecting systems, likely from recent CT scan. Air-fluid levels are demonstrated. No free intra-abdominal air.  IMPRESSION: Persistent changes consistent with small bowel obstruction. Enteric tube tip appears to be coiled in the distal esophagus.   Electronically Signed   By: Lucienne Capers M.D.   On: 09/17/2013 03:50   Dg Swallowing Func-speech Pathology  08/23/2013   Katherene Ponto Deblois, CCC-SLP     08/23/2013 10:37 AM Objective Swallowing Evaluation: Modified Barium Swallowing Study   Patient Details  Name: [name] MRN: [id number] Date of Birth: April 16, 1940  Today's  Date: 08/23/2013 Time: 0935-1000 SLP Time Calculation (min): 25 min  Past Medical History:  Past Medical History  Diagnosis Date  . Carotid artery disease     a. 60-70% bilat ICA stenosis by dopplers 05/2012.  Marland Kitchen Other and unspecified hyperlipidemia   . CAD (coronary artery disease)     a. Mod dz 2010 initially mgd medically. b. 12/2012 - angina s/p  PTCA/DES to mid-circumflex, PTCA/DES to first OM.   Marland Kitchen Hypertension   . Heart murmur   . Depression   . Barrett's esophagus   . Arthritis    Past Surgical History:  Past Surgical History  Procedure Laterality Date  . Appendectomy    . Cholecystectomy    . Colon resection    . 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 Rotator cuff repair Left   . Abdominal hysterectomy    . Colon surgery    . Foot surgery    . Hand surgery    . Laparotomy N/A 08/01/2013    Procedure: EXPLORATORY LAPAROTOMY;  Surgeon: Harl Bowie, MD;  Location: [place];  Service: General;  Laterality:  N/A;  . Partial colectomy N/A 08/01/2013    Procedure: PARTIAL COLECTOMY;  Surgeon: Harl Bowie, MD;   Location: [place];  Service: General;  Laterality: N/A;  . Colostomy N/A 08/01/2013    Procedure: COLOSTOMY;  Surgeon: Harl Bowie, MD;   Location: [place];  Service: General;  Laterality: N/A;  . Bowel resection N/A 08/01/2013    Procedure: SMALL BOWEL RESECTION;  Surgeon: Harl Bowie,  MD;  Location: [place];  Service: General;  Laterality: N/A;  . Tracheostomy      feinstein   HPI:  73 y/o female with PMH CAD, HTN, Barrett's esophagus, presented  to [facility] ED 7/10 with LLQ pain. Found to have bowel obstruction. To  OR for repair 7/13 (Laparotomy, partial colectomy, colostomy, SB  resection, repair of enterotomy, lysis of adhesions). Pt was  extubated on 7/14 but required reintubation until trach on 7/24.  Trach collar trials on 7/26. Pt also found to have a small ICH on  CT.      Assessment / Plan / Recommendation Clinical Impression  Dysphagia  Diagnosis: Within Functional Limits Clinical impression: Pt demonstrates normal swallow function,  though she did have intermittent coughing mid study unrelated to  any penetration or aspiration. NG tube was removed mid study to  ensure best swallow  function. Given ongoing generalized weakness,  recommend soft dys 3 diet with thin liquids. Pt must wear PMSV  during PO intake with full supervision, though pt should attempt  to feed herself. SLP will f/u for tolerance and upgrade to  regular diet as pts endurance progresses. (Also confirmed with  surgery PA that pt is ready for regular diet).     Treatment Recommendation  Therapy as outlined in treatment plan below    Diet Recommendation Dysphagia 3 (Mechanical Soft);Thin liquid   Liquid Administration via: Cup;Straw Medication Administration: Whole meds with liquid Supervision: Patient able to self feed;Full supervision/cueing  for compensatory strategies Compensations: Slow rate;Small sips/bites Postural Changes and/or Swallow Maneuvers: Seated upright 90  degrees    Other  Recommendations Oral Care Recommendations: Oral care BID Other Recommendations: Place PMSV during PO intake   Follow Up Recommendations  Inpatient Rehab    Frequency and Duration min 2x/week  2 weeks   Pertinent Vitals/Pain NA    SLP Swallow Goals     General HPI: 73 y/o female with PMH CAD, HTN, Barrett's  esophagus, presented to [facility] ED 7/10 with LLQ pain. Found to have  bowel obstruction. To OR for repair 7/13 (Laparotomy, partial  colectomy, colostomy, SB resection, repair of enterotomy, lysis  of adhesions). Pt was extubated on 7/14 but required reintubation  until trach on 7/24. Trach collar trials on 7/26. Pt also found  to have a small ICH on CT.  Type of Study: Modified Barium Swallowing Study Reason for Referral: Objectively evaluate swallowing function Diet Prior to this Study: NPO;Panda Temperature Spikes Noted: No Respiratory Status: Trach collar Trach Size and Type: #4;Uncuffed;With  PMSV in place History of Recent Intubation: Yes Length of Intubations (days): 12 days Date extubated: 09/12/13 Behavior/Cognition: Alert;Cooperative;Pleasant mood Oral Cavity - Dentition: Dentures, top Oral Motor / Sensory Function: Within functional limits Self-Feeding Abilities: Able to feed self Patient Positioning: Upright in chair Baseline Vocal Quality: Low vocal intensity Volitional Cough: Weak Volitional Swallow: Able to elicit Anatomy: Within functional limits    Reason for Referral Objectively evaluate swallowing function   Oral Phase Oral Preparation/Oral Phase Oral Phase: WFL   Pharyngeal Phase Pharyngeal Phase Pharyngeal Phase: Within functional limits  Cervical Esophageal Phase    GO    Cervical Esophageal Phase Cervical Esophageal Phase: [facility]        Herbie Baltimore, MA CCC-SLP [phone]  DeBlois, Katherene Ponto 08/23/2013, 10:18 AM     Oren Binet, MD  [facility] Pager:[phone]  If 7PM-7AM, please contact night-coverage [email or web address] Password [email or web address] 09/17/2013, 10:08 AM   LOS: 0 days   **Disclaimer: This note may have been dictated with voice recognition software. Similar sounding words can inadvertently be transcribed and this note may contain transcription errors which may not have been corrected upon publication of note.**

## 2013-09-17 NOTE — Progress Notes (Signed)
After results of 2nd abdominal xray posted, NG tube was advanced by 1-2 inches for better placement. Immediately suction began to pull increase in stomach content. Will continue to monitor.

## 2013-09-18 ENCOUNTER — Inpatient Hospital Stay (HOSPITAL_COMMUNITY): Payer: Medicare HMO

## 2013-09-18 DIAGNOSIS — I2510 Atherosclerotic heart disease of native coronary artery without angina pectoris: Secondary | ICD-10-CM

## 2013-09-18 DIAGNOSIS — E440 Moderate protein-calorie malnutrition: Secondary | ICD-10-CM | POA: Insufficient documentation

## 2013-09-18 LAB — CBC
HCT: 28.2 % — ABNORMAL LOW (ref 36.0–46.0)
Hemoglobin: 9.2 g/dL — ABNORMAL LOW (ref 12.0–15.0)
MCH: 28 pg (ref 26.0–34.0)
MCHC: 32.6 g/dL (ref 30.0–36.0)
MCV: 85.7 fL (ref 78.0–100.0)
Platelets: 300 10*3/uL (ref 150–400)
RBC: 3.29 MIL/uL — ABNORMAL LOW (ref 3.87–5.11)
RDW: 16.3 % — ABNORMAL HIGH (ref 11.5–15.5)
WBC: 5.5 10*3/uL (ref 4.0–10.5)

## 2013-09-18 LAB — COMPREHENSIVE METABOLIC PANEL
ALT: 8 U/L (ref 0–35)
AST: 23 U/L (ref 0–37)
Albumin: 2 g/dL — ABNORMAL LOW (ref 3.5–5.2)
Alkaline Phosphatase: 96 U/L (ref 39–117)
Anion gap: 13 (ref 5–15)
BUN: 5 mg/dL — ABNORMAL LOW (ref 6–23)
CO2: 24 mEq/L (ref 19–32)
Calcium: 7.9 mg/dL — ABNORMAL LOW (ref 8.4–10.5)
Chloride: 103 mEq/L (ref 96–112)
Creatinine, Ser: 0.44 mg/dL — ABNORMAL LOW (ref 0.50–1.10)
GFR calc Af Amer: >90 mL/min (ref >90)
GFR calc non Af Amer: >90 mL/min (ref >90)
Glucose, Bld: 71 mg/dL (ref 70–99)
Potassium: 3.2 mEq/L — ABNORMAL LOW (ref 3.7–5.3)
Sodium: 140 mEq/L (ref 137–147)
Total Bilirubin: 0.4 mg/dL (ref 0.3–1.2)
Total Protein: 4.7 g/dL — ABNORMAL LOW (ref 6.0–8.3)

## 2013-09-18 MED ORDER — BOOST / RESOURCE BREEZE PO LIQD
1.0000 | Freq: Three times a day (TID) | ORAL | Status: DC
  Administered 2013-09-18 (×3): 1 via ORAL

## 2013-09-18 MED ORDER — POTASSIUM CHLORIDE CRYS ER 20 MEQ PO TBCR
40.0000 meq | EXTENDED_RELEASE_TABLET | Freq: Every day | ORAL | Status: DC
  Administered 2013-09-18 – 2013-09-19 (×2): 40 meq via ORAL
  Filled 2013-09-18 (×2): qty 2

## 2013-09-18 NOTE — Progress Notes (Signed)
PATIENT DETAILS Name: [name] Age: 73 y.o. Sex: female Date of Birth: 1940-04-02 Admit Date: 09/17/2013 Admitting Physician Berle Mull, MD YKD:XIPJ,ASN, NP  Subjective: No abdominal pain. Colostomy bag with stool  Assessment/Plan: Principal Problem:   SBO (small bowel obstruction) -likely secondary to adhesions/scar tissue -admitted, kept NPO, NG tube placed for decompression, other supportive measures. Significantly improved, has stool in colostomy bag. Start clear liquids, mobilize and advance diet as tolerated -appreciate surgery eval  Active Problems:   CAD (coronary artery disease) -stable-with no chest pain/SOB -resume statin when PO intake resumes -not on beta blocker due soft BP-resume when able -not on ASA because of recent SAH vs SDH  Anemia -secondary to recent critical illness/chronic disease-no evidence of blood loss -monitor Hb periodically  Recent very small SAH vs SDH seen on CT head from 7/23  -no deficits on exam -follow  HTN -previously on lopressor-currently not on any anti-hypertensives-resume when able  Recent hx of DVT of axillary vein, right  -etiology likely due to Right IJ central line that had been inserted on 7/14 -not started on anticoagulation last admit due to GI issues, and small SAH vs SDH seen on CT Head -no edema seen on right arm  Depression  -stable -resume Zoloft when oral intake resumes  Barrett's Esophagus -on PPI  Recent hx of  Colonic obstruction due to suspected colitis; s/p colectomy/colostomy   Recent H/O tracheostomy-now decannulated  Disposition: Remain inpatient-suspect home 8/31  DVT Prophylaxis:  SCD's  Code Status:  DNR  Family Communication Spouse at bedside  Procedures:  None  CONSULTS:  general surgery   MEDICATIONS: Scheduled Meds: . antiseptic oral rinse  7 mL Mouth Rinse q12n4p  . chlorhexidine  15 mL Mouth Rinse BID  . lip balm  1 application Topical BID  .  pantoprazole (PROTONIX) IV  40 mg Intravenous Q24H  . potassium chloride  40 mEq Oral Daily  . sodium chloride  3 mL Intravenous Q12H   Continuous Infusions: . sodium chloride 75 mL/hr at 09/18/13 0622   PRN Meds:.acetaminophen, acetaminophen, ipratropium-albuterol, morphine injection, nitroGLYCERIN, ondansetron (ZOFRAN) IV, ondansetron, phenol  Antibiotics: Anti-infectives   None       PHYSICAL EXAM: Vital signs in last 24 hours: Filed Vitals:   09/17/13 0549 09/17/13 1500 09/17/13 2129 09/18/13 0554  BP: 116/51 92/74 135/50 113/44  Pulse: 99 97 94 101  Temp: 98.4 F (36.9 C) 99 F (37.2 C) 98.5 F (36.9 C) 98.6 F (37 C)  TempSrc: Oral Oral Oral Oral  Resp: 20 20 20 18   Height:      Weight:      SpO2: 100% 100% 99% 97%    Weight change:  Filed Weights   09/17/13 0227 09/17/13 0443  Weight: 59.104 kg (130 lb 4.8 oz) 59.204 kg (130 lb 8.3 oz)   Body mass index is 25.49 kg/(m^2).   Gen Exam: Awake and alert with clear speech.  Neck: Supple, No JVD.   Chest: B/L Clear.  No rales or rhonchi CVS: S1 S2 Regular, no murmurs. Abdomen: soft, BS +, non tender, non distended. Mid line lapartomy wound-packed-dressing dry. Colostomy bag with stool Extremities: no edema, lower extremities warm to touch. Neurologic: Non Focal.   Skin: No Rash.   Wounds: N/A.   Intake/Output from previous day:  Intake/Output Summary (Last 24 hours) at 09/18/13 0936 Last data filed at 09/18/13 0840  Gross per 24 hour  Intake 971.25 ml  Output  1600 ml  Net -628.75 ml     LAB RESULTS: CBC  Recent Labs Lab 09/17/13 0711 09/18/13 0610  WBC 6.8 5.5  HGB 9.7* 9.2*  HCT 30.4* 28.2*  PLT 278 300  MCV 86.6 85.7  MCH 27.6 28.0  MCHC 31.9 32.6  RDW 16.3* 16.3*  LYMPHSABS 1.6  --   MONOABS 0.5  --   EOSABS 0.1  --   BASOSABS 0.0  --     Chemistries   Recent Labs Lab 09/17/13 0711 09/18/13 0610  NA 145 140  K 3.3* 3.2*  CL 107 103  CO2 26 24  GLUCOSE 92 71  BUN 12 5*   CREATININE 0.47* 0.44*  CALCIUM 8.0* 7.9*  MG 1.2*  --     CBG: No results found for this basename: GLUCAP,  in the last 168 hours  GFR Estimated Creatinine Clearance: 49.7 ml/min (by C-G formula based on Cr of 0.44).  Coagulation profile  Recent Labs Lab 09/17/13 0711  INR 1.16    Cardiac Enzymes No results found for this basename: CK, CKMB, TROPONINI, MYOGLOBIN,  in the last 168 hours  No components found with this basename: POCBNP,  No results found for this basename: DDIMER,  in the last 72 hours No results found for this basename: HGBA1C,  in the last 72 hours No results found for this basename: CHOL, HDL, LDLCALC, TRIG, CHOLHDL, LDLDIRECT,  in the last 72 hours No results found for this basename: TSH, T4TOTAL, FREET3, T3FREE, THYROIDAB,  in the last 72 hours No results found for this basename: VITAMINB12, FOLATE, FERRITIN, TIBC, IRON, RETICCTPCT,  in the last 72 hours No results found for this basename: LIPASE, AMYLASE,  in the last 72 hours  Urine Studies No results found for this basename: UACOL, UAPR, USPG, UPH, UTP, UGL, UKET, UBIL, UHGB, UNIT, UROB, ULEU, UEPI, UWBC, URBC, UBAC, CAST, CRYS, UCOM, BILUA,  in the last 72 hours  MICROBIOLOGY: No results found for this or any previous visit (from the past 240 hour(s)).  RADIOLOGY STUDIES/RESULTS: Dg Chest Port 1 View  08/21/2013   CLINICAL DATA:  Shortness of breath, tracheostomy  EXAM: PORTABLE CHEST - 1 VIEW  COMPARISON:  08/17/2013  FINDINGS: Cardiomegaly with pulmonary vascular congestion and suspected mild interstitial edema. Patchy bilateral lower lobe opacities, possibly atelectasis, left lower lobe pneumonia not excluded. Small bilateral pleural effusions, left greater than right. No pneumothorax.  Tracheostomy in satisfactory position.  Right IJ venous catheter terminates at the cavoatrial junction.  Enteric tube courses into the stomach.  IMPRESSION: Cardiomegaly with mild interstitial edema.  Patchy bilateral  lower lobe opacities, possibly atelectasis, left lower lobe not excluded.  Small bilateral pleural effusions, left greater than right.  Tracheostomy in satisfactory position. Additional support apparatus as above.   Electronically Signed   By: Julian Hy M.D.   On: 08/21/2013 10:37   Dg Abd Portable 1v  09/17/2013   CLINICAL DATA:  NG tube placement.  EXAM: PORTABLE ABDOMEN - 1 VIEW  COMPARISON:  09/17/2013  FINDINGS: Enteric tube has been advanced since previous study. Tip is now located in the left upper quadrant consistent with location in the stomach. Proximal side hole remains in the lower chest, probably in the distal esophagus or perhaps in a hiatal hernia. Bowel gas pattern is unchanged. Surgical clips in the right upper quadrant.  IMPRESSION: Enteric tube tip localizes over the upper stomach with proximal side hole probably in the distal esophagus.   Electronically Signed   By: Gwyndolyn Saxon  Gerilyn Nestle M.D.   On: 09/17/2013 05:49   Dg Abd Portable 2v  09/17/2013   CLINICAL DATA:  NG tube insertion it and followup small bowel obstruction.  EXAM: PORTABLE ABDOMEN - 2 VIEW  COMPARISON:  CT abdomen and pelvis 09/16/2013.  Abdomen 08/17/2013.  FINDINGS: Enteric tube tip is coiled in the lower chest consistent with location in the distal esophagus or possibly within a hiatal hernia. Stool-filled colon with with mild gaseous distention of left lower quadrant bowel. Left lower quadrant ostomy is present. Residual contrast material in the bladder and renal collecting systems, likely from recent CT scan. Air-fluid levels are demonstrated. No free intra-abdominal air.  IMPRESSION: Persistent changes consistent with small bowel obstruction. Enteric tube tip appears to be coiled in the distal esophagus.   Electronically Signed   By: Lucienne Capers M.D.   On: 09/17/2013 03:50   Dg Swallowing Func-speech Pathology  08/23/2013   Katherene Ponto Deblois, CCC-SLP     08/23/2013 10:37 AM Objective Swallowing  Evaluation: Modified Barium Swallowing Study   Patient Details  Name: [name] MRN: [id number] Date of Birth: [birth year]  Today's Date: 08/23/2013 Time: 0935-1000 SLP Time Calculation (min): 25 min  Past Medical History:  Past Medical History  Diagnosis Date  . Carotid artery disease     a. 60-70% bilat ICA stenosis by dopplers 05/2012.  Marland Kitchen Other and unspecified hyperlipidemia   . CAD (coronary artery disease)     a. Mod dz 2010 initially mgd medically. b. 12/2012 - angina s/p  PTCA/DES to mid-circumflex, PTCA/DES to first OM.   Marland Kitchen Hypertension   . Heart murmur   . Depression   . Barrett's esophagus   . Arthritis    Past Surgical History:  Past Surgical History  Procedure Laterality Date  . Appendectomy    . Cholecystectomy    . Colon resection    . 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 Rotator cuff repair Left   . Abdominal hysterectomy    . Colon surgery    . Foot surgery    . Hand surgery    . Laparotomy N/A 08/01/2013    Procedure: EXPLORATORY LAPAROTOMY;  Surgeon: Harl Bowie, MD;  Location: [place];  Service: General;  Laterality:  N/A;  . Partial colectomy N/A 08/01/2013    Procedure: PARTIAL COLECTOMY;  Surgeon: Harl Bowie, MD;   Location: [place];  Service: General;  Laterality: N/A;  . Colostomy N/A 08/01/2013    Procedure: COLOSTOMY;  Surgeon: Harl Bowie, MD;   Location: [place];  Service: General;  Laterality: N/A;  . Bowel resection N/A 08/01/2013    Procedure: SMALL BOWEL RESECTION;  Surgeon: Harl Bowie,  MD;  Location: [place];  Service: General;  Laterality: N/A;  . Tracheostomy      feinstein   HPI:  73 y/o female with PMH CAD, HTN, Barrett's esophagus, presented  to [facility] ED 7/10 with LLQ pain. Found to have bowel obstruction. To  OR for repair 7/13 (Laparotomy, partial colectomy, colostomy, SB  resection, repair of enterotomy, lysis of adhesions). Pt was  extubated on 7/14 but required reintubation until trach on 7/24.  Trach  collar trials on 7/26. Pt also found to have a small ICH on  CT.      Assessment / Plan / Recommendation Clinical Impression  Dysphagia Diagnosis: Within Functional Limits Clinical impression: Pt demonstrates  normal swallow function,  though she did have intermittent coughing mid study unrelated to  any penetration or aspiration. NG tube was removed mid study to  ensure best swallow function. Given ongoing generalized weakness,  recommend soft dys 3 diet with thin liquids. Pt must wear PMSV  during PO intake with full supervision, though pt should attempt  to feed herself. SLP will f/u for tolerance and upgrade to  regular diet as pts endurance progresses. (Also confirmed with  surgery PA that pt is ready for regular diet).     Treatment Recommendation  Therapy as outlined in treatment plan below    Diet Recommendation Dysphagia 3 (Mechanical Soft);Thin liquid   Liquid Administration via: Cup;Straw Medication Administration: Whole meds with liquid Supervision: Patient able to self feed;Full supervision/cueing  for compensatory strategies Compensations: Slow rate;Small sips/bites Postural Changes and/or Swallow Maneuvers: Seated upright 90  degrees    Other  Recommendations Oral Care Recommendations: Oral care BID Other Recommendations: Place PMSV during PO intake   Follow Up Recommendations  Inpatient Rehab    Frequency and Duration min 2x/week  2 weeks   Pertinent Vitals/Pain NA    SLP Swallow Goals     General HPI: 73 y/o female with PMH CAD, HTN, Barrett's  esophagus, presented to [facility] ED 7/10 with LLQ pain. Found to have  bowel obstruction. To OR for repair 7/13 (Laparotomy, partial  colectomy, colostomy, SB resection, repair of enterotomy, lysis  of adhesions). Pt was extubated on 7/14 but required reintubation  until trach on 7/24. Trach collar trials on 7/26. Pt also found  to have a small ICH on CT.  Type of Study: Modified Barium Swallowing Study Reason for Referral: Objectively evaluate swallowing function  Diet Prior to this Study: NPO;Panda Temperature Spikes Noted: No Respiratory Status: Trach collar Trach Size and Type: #4;Uncuffed;With PMSV in place History of Recent Intubation: Yes Length of Intubations (days): 12 days Date extubated: 09/12/13 Behavior/Cognition: Alert;Cooperative;Pleasant mood Oral Cavity - Dentition: Dentures, top Oral Motor / Sensory Function: Within functional limits Self-Feeding Abilities: Able to feed self Patient Positioning: Upright in chair Baseline Vocal Quality: Low vocal intensity Volitional Cough: Weak Volitional Swallow: Able to elicit Anatomy: Within functional limits    Reason for Referral Objectively evaluate swallowing function   Oral Phase Oral Preparation/Oral Phase Oral Phase: WFL   Pharyngeal Phase Pharyngeal Phase Pharyngeal Phase: Within functional limits  Cervical Esophageal Phase    GO    Cervical Esophageal Phase Cervical Esophageal Phase: [facility]        Herbie Baltimore, MA CCC-SLP [phone]  DeBlois, Katherene Ponto 08/23/2013, 10:18 AM     Oren Binet, MD  [facility] Pager:[phone]  If 7PM-7AM, please contact night-coverage [email or web address] Password [email or web address] 09/18/2013, 9:36 AM   LOS: 1 day   **Disclaimer: This note may have been dictated with voice recognition software. Similar sounding words can inadvertently be transcribed and this note may contain transcription errors which may not have been corrected upon publication of note.**

## 2013-09-18 NOTE — Progress Notes (Signed)
INITIAL NUTRITION ASSESSMENT  DOCUMENTATION CODES Per approved criteria  -Non-severe (moderate) malnutrition in the context of chronic illness   INTERVENTION:  Clear liquid diet to be advanced as tolerated per MD [employer]ze po TID, each supplement provides 250 kcal and 9 grams of protein RD to follow  NUTRITION DIAGNOSIS: Inadequate oral intake related to altered gi function as evidenced by clear liquid diet.   Goal: Diet advancement with intake of meals and supplements to meet >90% estimated needs.  Monitor:  Diet advancement, tolerance and intake; labs; weight trend  Reason for Assessment: MST  73 y.o. female  Admitting Dx: SBO (small bowel obstruction)  ASSESSMENT: PMHx significant for diverticulitis, CAD, HTN, depression. Admitted with abdominal pain and c/n x 2 weeks. Hx of lysis of adhesions and ovarian cystectomy for SBO in April 2015. Work-up reveals colonic obstruction proximal to descending colon, MD suspects etiology of ischemia vs malignancy. Seen by inpatient RD 08/01/13-08/25/13.  S/P laparotomy, partial colectomy, colostomy, SB resection, repair of enterostomy, lysis of adhesions on 7/13 and received TPN from 7/13-7/20 and then TF with Vital AF 1.2.  8/30: Readmitted with SBO. Patient reports good intake prior to admit. Symptoms started after eating [employer] Friday. Was taking no nutritional supplements at home. UBW 168 lbs per patient.  162 lbs per e-chart 5 months ago. Patient with a 17% weight loss in the past month.  Patient meets criteria for mild/moderate malnutrition related to chronic illness AEB weight loss of 17% in the past month and mild depletion of muscle mass.  Nutrition Focused Physical Exam:  Subcutaneous Fat:  Orbital Region: wnl Upper Arm Region: wnl Thoracic and Lumbar Region: wnl  Muscle:  Temple Region: moderate Clavicle Bone Region: wnl Clavicle and Acromion Bone Region: wnl Scapular Bone Region: wnl Dorsal Hand:  wnl Patellar Region: wnl Anterior Thigh Region: mild Posterior Calf Region: wnl  Edema: not noted.      Height: Ht Readings from Last 1 Encounters:  09/17/13 5' (1.524 m)    Weight: Wt Readings from Last 1 Encounters:  09/18/13 131 lb 11.2 oz (59.739 kg)    Ideal Body Weight: 100  % Ideal Body Weight: 131  Wt Readings from Last 10 Encounters:  09/18/13 131 lb 11.2 oz (59.739 kg)  09/16/13 134 lb (60.782 kg)  08/24/13 158 lb 11.7 oz (72 kg)  08/24/13 158 lb 11.7 oz (72 kg)  03/31/13 162 lb (73.483 kg)  01/04/13 160 lb (72.576 kg)  12/21/12 162 lb 11.2 oz (73.8 kg)  12/21/12 162 lb 11.2 oz (73.8 kg)  12/13/12 158 lb (71.668 kg)  12/05/11 160 lb (72.576 kg)    Usual Body Weight: 168 per pt  % Usual Body Weight: 78  BMI:  Body mass index is 25.72 kg/(m^2).  Estimated Nutritional Needs: Kcal: 1600-1700 Protein: 70-80 gm Fluid: 1.6-1.7L daily  Skin: intact  Diet Order: Clear Liquid  EDUCATION NEEDS: -Education needs addressed   Intake/Output Summary (Last 24 hours) at 09/18/13 1037 Last data filed at 09/18/13 0900  Gross per 24 hour  Intake 1291.25 ml  Output   1600 ml  Net -308.75 ml    Last BM: colostomy- today   Labs:   Recent Labs Lab 09/17/13 0711 09/18/13 0610  NA 145 140  K 3.3* 3.2*  CL 107 103  CO2 26 24  BUN 12 5*  CREATININE 0.47* 0.44*  CALCIUM 8.0* 7.9*  MG 1.2*  --   GLUCOSE 92 71    CBG (last 3)  No results  found for this basename: GLUCAP,  in the last 72 hours  Scheduled Meds: . antiseptic oral rinse  7 mL Mouth Rinse q12n4p  . chlorhexidine  15 mL Mouth Rinse BID  . lip balm  1 application Topical BID  . pantoprazole (PROTONIX) IV  40 mg Intravenous Q24H  . potassium chloride  40 mEq Oral Daily  . sodium chloride  3 mL Intravenous Q12H    Continuous Infusions: . sodium chloride 75 mL/hr at 09/18/13 3295    Past Medical History  Diagnosis Date  . Carotid artery disease     a. 60-70% bilat ICA stenosis by  dopplers 05/2012.  Marland Kitchen Other and unspecified hyperlipidemia   . CAD (coronary artery disease)     a. Mod dz 2010 initially mgd medically. b. 12/2012 - angina s/p PTCA/DES to mid-circumflex, PTCA/DES to first OM.   Marland Kitchen Hypertension   . Heart murmur   . Depression   . Barrett's esophagus   . Arthritis     Past Surgical History  Procedure Laterality Date  . Appendectomy    . Cholecystectomy    . Colon resection    . 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 Rotator cuff repair Left   . Abdominal hysterectomy    . Colon surgery    . Foot surgery    . Hand surgery    . Laparotomy N/A 08/01/2013    Procedure: EXPLORATORY LAPAROTOMY;  Surgeon: Harl Bowie, MD;  Location: [place];  Service: General;  Laterality: N/A;  . Partial colectomy N/A 08/01/2013    Procedure: PARTIAL COLECTOMY;  Surgeon: Harl Bowie, MD;  Location: [place];  Service: General;  Laterality: N/A;  . Colostomy N/A 08/01/2013    Procedure: COLOSTOMY;  Surgeon: Harl Bowie, MD;  Location: [place];  Service: General;  Laterality: N/A;  . Bowel resection N/A 08/01/2013    Procedure: SMALL BOWEL RESECTION;  Surgeon: Harl Bowie, MD;  Location: [place];  Service: General;  Laterality: N/A;  . Tracheostomy      feinstein    Antonieta Iba, RD, LDN Clinical Inpatient Dietitian Pager:  [phone] Weekend and after hours pager:  [phone]

## 2013-09-18 NOTE — Progress Notes (Signed)
Output noticed in Colostomy pouch ,  soft in consistency .  Pt continues to  deny N/V . Will cont to  Monitor.

## 2013-09-18 NOTE — Progress Notes (Signed)
Subjective: Passing gas and stool in ostomy.  Still having some crampy lower abdominal pain, but no n/v.    Objective: Vital signs in last 24 hours: Temp:  [98.5 F (36.9 C)-99 F (37.2 C)] 98.6 F (37 C) Oct 05, 2022 0554) Pulse Rate:  [94-101] 101 10/05/22 0554) Resp:  [18-20] 18 05-Oct-2022 0554) BP: (92-135)/(44-74) 113/44 mmHg 05-Oct-2022 0554) SpO2:  [97 %-100 %] 97 % 05-Oct-2022 0554) Weight:  [131 lb 11.2 oz (59.739 kg)] 131 lb 11.2 oz (59.739 kg) 2022-10-05 0950) Last BM Date: 2013-10-04  Intake/Output from previous day: 08/29 0701 - Oct 05, 2022 0700 In: 900 [I.V.:900] Out: 1580 [Urine:1580] Intake/Output this shift: Total I/O In: 391.3 [P.O.:320; I.V.:71.3] Out: 120 [Urine:120]  General appearance: alert, cooperative and no distress Resp: breathing comfortably GI: soft, non tender, stool, gas in ostomy bag, sl distended  Lab Results:   Recent Labs  09/17/13 0711 2013/10/04 0610  WBC 6.8 5.5  HGB 9.7* 9.2*  HCT 30.4* 28.2*  PLT 278 300   BMET  Recent Labs  09/17/13 0711 Oct 04, 2013 0610  NA 145 140  K 3.3* 3.2*  CL 107 103  CO2 26 24  GLUCOSE 92 71  BUN 12 5*  CREATININE 0.47* 0.44*  CALCIUM 8.0* 7.9*   PT/INR  Recent Labs  09/17/13 0711  LABPROT 14.8  INR 1.16   ABG No results found for this basename: PHART, PCO2, PO2, HCO3,  in the last 72 hours  Studies/Results: Dg Abd 2 Views  2013/10/04   CLINICAL DATA:  73 year old female status post surgery, small bowel obstruction. Initial encounter.  EXAM: ABDOMEN - 2 VIEW  COMPARISON:  09/17/2013 and earlier.  FINDINGS: Enteric tube removed. Small pleural effusions. Mildly increased streaky bibasilar opacity. Stable visible mediastinal contours.  Left abdominal ostomy again noted. Non obstructed bowel gas pattern. Stable right upper quadrant clips. Stable visualized osseous structures.  IMPRESSION: 1. Enteric tube removed. 2.  Non obstructed bowel gas pattern, no free air. 3. New small bilateral pleural effusions with bibasilar  atelectasis.   Electronically Signed   By: Lars Pinks M.D.   On: 04-Oct-2013 07:45   Dg Abd Portable 1v  09/17/2013   CLINICAL DATA:  NG tube placement.  EXAM: PORTABLE ABDOMEN - 1 VIEW  COMPARISON:  09/17/2013  FINDINGS: Enteric tube has been advanced since previous study. Tip is now located in the left upper quadrant consistent with location in the stomach. Proximal side hole remains in the lower chest, probably in the distal esophagus or perhaps in a hiatal hernia. Bowel gas pattern is unchanged. Surgical clips in the right upper quadrant.  IMPRESSION: Enteric tube tip localizes over the upper stomach with proximal side hole probably in the distal esophagus.   Electronically Signed   By: Lucienne Capers M.D.   On: 09/17/2013 05:49   Dg Abd Portable 2v  09/17/2013   CLINICAL DATA:  NG tube insertion it and followup small bowel obstruction.  EXAM: PORTABLE ABDOMEN - 2 VIEW  COMPARISON:  CT abdomen and pelvis 09/16/2013.  Abdomen 08/17/2013.  FINDINGS: Enteric tube tip is coiled in the lower chest consistent with location in the distal esophagus or possibly within a hiatal hernia. Stool-filled colon with with mild gaseous distention of left lower quadrant bowel. Left lower quadrant ostomy is present. Residual contrast material in the bladder and renal collecting systems, likely from recent CT scan. Air-fluid levels are demonstrated. No free intra-abdominal air.  IMPRESSION: Persistent changes consistent with small bowel obstruction. Enteric tube tip appears to be coiled in  the distal esophagus.   Electronically Signed   By: Lucienne Capers M.D.   On: 09/17/2013 03:50    Anti-infectives: Anti-infectives   None      Assessment/Plan: s/p * No surgery found * clear liquids today.   Advance tomorrow or later today to full liquids if tolerating and if crampy pain not getting worse.   LOS: 1 day    [name] 09/18/2013

## 2013-09-18 NOTE — Progress Notes (Signed)
Paged to D Fischer, MD and notified that DBP has been running low , 113/44 on this am. Will cont to monitor.

## 2013-09-18 NOTE — Progress Notes (Signed)
MD called back and no  new order received.

## 2013-09-19 ENCOUNTER — Encounter (INDEPENDENT_AMBULATORY_CARE_PROVIDER_SITE_OTHER): Payer: Commercial Managed Care - HMO | Admitting: Surgery

## 2013-09-19 LAB — BASIC METABOLIC PANEL
Anion gap: 14 (ref 5–15)
BUN: 3 mg/dL — AB (ref 6–23)
CHLORIDE: 105 meq/L (ref 96–112)
CO2: 22 mEq/L (ref 19–32)
Calcium: 8.3 mg/dL — ABNORMAL LOW (ref 8.4–10.5)
Creatinine, Ser: 0.41 mg/dL — ABNORMAL LOW (ref 0.50–1.10)
GFR calc non Af Amer: >90 mL/min (ref >90)
Glucose, Bld: 121 mg/dL — ABNORMAL HIGH (ref 70–99)
POTASSIUM: 3 meq/L — AB (ref 3.7–5.3)
Sodium: 141 mEq/L (ref 137–147)

## 2013-09-19 MED ORDER — DULOXETINE HCL 60 MG PO CPEP
60.0000 mg | ORAL_CAPSULE | Freq: Every day | ORAL | Status: DC
  Administered 2013-09-19: 60 mg via ORAL
  Filled 2013-09-19: qty 1

## 2013-09-19 MED ORDER — CLOPIDOGREL BISULFATE 75 MG PO TABS
75.0000 mg | ORAL_TABLET | Freq: Every day | ORAL | Status: DC
  Administered 2013-09-19: 75 mg via ORAL
  Filled 2013-09-19: qty 1

## 2013-09-19 MED ORDER — METOPROLOL TARTRATE 12.5 MG HALF TABLET: 12.5000 mg | ORAL_TABLET | Freq: Two times a day (BID) | ORAL | Status: DC

## 2013-09-19 NOTE — Progress Notes (Signed)
Physical Therapy Treatment Patient Details Name: [name] MRN: [id number] DOB: 09-Jan-1941 Today's Date: 09/19/2013    History of Present Illness Admitted with nausea and abdominal pain    PT Comments    Patient progressing well with mobility. Able to ambulate short distances within room without AD (did need to use furniture for support) however recommend RW for support for longer distances due to unsteadiness. Required Min A for stair negotiation due to weakness in BLEs. Education provided on performing short bouts of exercise due to episodes of tachycardia. Would continue to benefit from HHPT at discharge to improve strength, balance and overall safe mobility. Will continue to follow and progress as tolerated.    Follow Up Recommendations  Home health PT     Equipment Recommendations  None recommended by PT    Recommendations for Other Services       Precautions / Restrictions Precautions Precautions: None Restrictions Weight Bearing Restrictions: No    Mobility  Bed Mobility               General bed mobility comments: Received sitting in chair upon arrival.   Transfers Overall transfer level: Needs assistance Equipment used: None Transfers: Sit to/from Stand Sit to Stand: Supervision         General transfer comment: used safe transfer technique appropriately  Ambulation/Gait Ambulation/Gait assistance: Supervision Ambulation Distance (Feet): 300 Feet Assistive device: Rolling walker (2 wheeled) Gait Pattern/deviations: Step-through pattern;Narrow base of support;Trunk flexed Gait velocity: 1.7 ft/sec Gait velocity interpretation: <1.8 ft/sec, indicative of risk for recurrent falls General Gait Details: Pt steady with use of RW for longer distances. Ambulated within room without AD- more unsteady requiring need to furniture walk for support. Pt tachycardic 135 bpm post stair negotiation. Stayed in 120s during gait. A few short standing rest  breaks.   Stairs Stairs: Yes Stairs assistance: Min assist Stair Management: Step to pattern;One rail Right;One rail Left   General stair comments: Required VC for proper sequencing as pt with difficulty negotiating steps. Min A to ascend steps and for balance to descend steps.   Wheelchair Mobility    Modified Rankin (Stroke Patients Only)       Balance Overall balance assessment: Needs assistance   Sitting balance-Leahy Scale: Good     Standing balance support: During functional activity Standing balance-Leahy Scale: Fair                      Cognition Arousal/Alertness: Awake/alert Behavior During Therapy: WFL for tasks assessed/performed Overall Cognitive Status: Within Functional Limits for tasks assessed                      Exercises Other Exercises Other Exercises: Sit<-> stand x10 from recliner with VC for slow descent and glute squeezing upon standing. Multiple rest breaks due to increased HR.    General Comments        Pertinent Vitals/Pain Pain Assessment: No/denies pain    Home Living                      Prior Function            PT Goals (current goals can now be found in the care plan section) Progress towards PT goals: Progressing toward goals    Frequency       PT Plan Current plan remains appropriate    Co-evaluation             End of  Session Equipment Utilized During Treatment: Gait belt Activity Tolerance: Patient tolerated treatment well Patient left: in chair;with call bell/phone within reach     Time: 1028-1051 PT Time Calculation (min): 23 min  Charges:  $Gait Training: 8-22 mins $Therapeutic Activity: 8-22 mins                    G CodesCandy Sledge A 2013-09-30, 11:00 AM Candy Sledge, [place], DPT [phone]

## 2013-09-19 NOTE — Care Management Note (Signed)
    Page 1 of 1   09/19/2013     3:18:40 PM CARE MANAGEMENT NOTE 09/19/2013  Patient:  [name], [name]   Account Number:  [id number]  Date Initiated:  09/19/2013  Documentation initiated by:  Tomi Bamberger  Subjective/Objective Assessment:   dx sbo, abd pain  admit- lives with spouse. [facility] with [facility] for HHRN/PT.     Action/Plan:   Anticipated DC Date:  09/19/2013   Anticipated DC Plan:  [place]  CM consult      [facility] Choice  HOME HEALTH  Resumption Of Svcs/PTA Provider   Choice offered to / List presented to:  [name]        [place] arranged  HH-1 RN  [place] PT      [facility] agency  [place]   Status of service:  Completed, signed off Medicare Important Message given?  NA - LOS <3 / Initial given by admissions (If response is "NO", the following Medicare IM given date fields will be blank) Date Medicare IM given:   Medicare IM given by:   Date Additional Medicare IM given:   Additional Medicare IM given by:    Discharge Disposition:  [place]  Per UR Regulation:  Reviewed for med. necessity/level of care/duration of stay  If discussed at [place] of Stay Meetings, dates discussed:    Comments:  09/19/13 [place], BSN [phone] patient is for dc to home and will resume services with [facility] for [facility] and PT.  [facility] health notified that patient is for dc today.

## 2013-09-19 NOTE — Progress Notes (Signed)
  Subjective: Moving bowels and wants to eat.  Bag has been emptied 3 times this am  Objective: Vital signs in last 24 hours: Temp:  [98.6 F (37 C)-99.5 F (37.5 C)] 98.6 F (37 C) (08/31 0606) Pulse Rate:  [89-102] 89 (08/31 0606) Resp:  [18-20] 18 (08/31 0606) BP: (134-149)/(55-70) 149/70 mmHg (08/31 0606) SpO2:  [98 %-100 %] 98 % (08/31 0606) Weight:  [122 lb 6.4 oz (55.52 kg)-131 lb 11.2 oz (59.739 kg)] 122 lb 6.4 oz (55.52 kg) (08/31 0606) Last BM Date: 2013/09/19  Intake/Output from previous day: 09-20-22 0701 - 08/31 0700 In: 1311.3 [P.O.:640; I.V.:671.3] Out: 1970 [Urine:920; Stool:1050] Intake/Output this shift:    Incision/Wound:open.  Soft NT ND  Lab Results:   Recent Labs  09/17/13 0711 09/19/2013 0610  WBC 6.8 5.5  HGB 9.7* 9.2*  HCT 30.4* 28.2*  PLT 278 300   BMET  Recent Labs  09/17/13 0711 09-19-2013 0610  NA 145 140  K 3.3* 3.2*  CL 107 103  CO2 26 24  GLUCOSE 92 71  BUN 12 5*  CREATININE 0.47* 0.44*  CALCIUM 8.0* 7.9*   PT/INR  Recent Labs  09/17/13 0711  LABPROT 14.8  INR 1.16   ABG No results found for this basename: PHART, PCO2, PO2, HCO3,  in the last 72 hours  Studies/Results: Dg Abd 2 Views  Sep 19, 2013   CLINICAL DATA:  73 year old female status post surgery, small bowel obstruction. Initial encounter.  EXAM: ABDOMEN - 2 VIEW  COMPARISON:  09/17/2013 and earlier.  FINDINGS: Enteric tube removed. Small pleural effusions. Mildly increased streaky bibasilar opacity. Stable visible mediastinal contours.  Left abdominal ostomy again noted. Non obstructed bowel gas pattern. Stable right upper quadrant clips. Stable visualized osseous structures.  IMPRESSION: 1. Enteric tube removed. 2.  Non obstructed bowel gas pattern, no free air. 3. New small bilateral pleural effusions with bibasilar atelectasis.   Electronically Signed   By: Lars Pinks M.D.   On: 09-19-2013 07:45    Anti-infectives: Anti-infectives   None       Assessment/Plan: pSBO  Resolved adv diet if she tolerates she can go home later today   LOS: 2 days    [name] A. 09/19/2013

## 2013-09-19 NOTE — Progress Notes (Signed)
NURSING PROGRESS NOTE  [name] [id number] Discharge Data: 09/19/2013 3:26 PM Attending Provider: No att. providers found HYI:FOYD,XAJ, NP     [name] to be D/C'd Home per MD order.  Discussed with the patient the After Visit Summary and all questions fully answered. All IV's discontinued with no bleeding noted. All belongings returned to patient for patient to take home.   Last Vital Signs:  Blood pressure 149/70, pulse 89, temperature 98.6 F (37 C), temperature source Oral, resp. rate 18, height 5' (1.524 m), weight 55.52 kg (122 lb 6.4 oz), SpO2 98.00%.  Discharge Medication List   Medication List    STOP taking these medications       oxycodone 5 MG capsule  Commonly known as:  OXY-IR      TAKE these medications       acetaminophen 325 MG tablet  Commonly known as:  TYLENOL  Take 2 tablets (650 mg total) by mouth every 6 (six) hours as needed for mild pain, fever or headache.     atorvastatin 80 MG tablet  Commonly known as:  LIPITOR  Take 80 mg by mouth every evening.     clopidogrel 75 MG tablet  Commonly known as:  PLAVIX  Take 75 mg by mouth daily.     DULoxetine 60 MG capsule  Commonly known as:  CYMBALTA  Take 60 mg by mouth daily.     feeding supplement (ENSURE COMPLETE) Liqd  Take 237 mLs by mouth 2 (two) times daily between meals.     fish oil-omega-3 fatty acids 1000 MG capsule  Take 1,000 mg by mouth daily.     folic acid 287 MCG tablet  Commonly known as:  FOLVITE  Take 800 mcg by mouth daily.     ipratropium-albuterol 0.5-2.5 (3) MG/3ML Soln  Commonly known as:  DUONEB  Take 3 mLs by nebulization every 6 (six) hours as needed (for shortness of breath).     lip balm Oint  Apply 1 application topically 2 (two) times daily.     metoprolol tartrate 12.5 mg Tabs tablet  Commonly known as:  LOPRESSOR  Take 0.5 tablets (12.5 mg total) by mouth 2 (two) times daily.     nitroGLYCERIN 0.4 MG SL tablet  Commonly known as:   NITROSTAT  Place 1 tablet (0.4 mg total) under the tongue every 5 (five) minutes as needed for chest pain.     ondansetron 4 MG tablet  Commonly known as:  ZOFRAN  Take 4 mg by mouth every 8 (eight) hours as needed for nausea or vomiting.     oxyCODONE-acetaminophen 5-325 MG per tablet  Commonly known as:  PERCOCET/ROXICET  Take 1-2 tablets by mouth every 4 (four) hours as needed for moderate pain.     pantoprazole 40 MG tablet  Commonly known as:  PROTONIX  Take 40 mg by mouth daily.     zaleplon 10 MG capsule  Commonly known as:  SONATA  Take 10 mg by mouth at bedtime.         Wallie Renshaw, RN

## 2013-09-19 NOTE — Discharge Summary (Addendum)
Physician Discharge Summary  [name] KDX:[id number] DOB: 02/06/1940 DOA: 09/17/2013  PCP: [name] Peace, NP  Admit date: 09/17/2013 Discharge date: 09/19/2013  Time spent: 35 minutes  Recommendations for Outpatient Follow-up:  1. Follow up CBC to reassess anemia 2. Home health PT  Discharge Diagnoses:  Principal Problem:   SBO (small bowel obstruction) Active Problems:   CAD (coronary artery disease)   Hypertension   Colonic obstruction due to suspected colitis; s/p colectomy/colostomy   H/O tracheostomy   Malnutrition of moderate degree   Discharge Condition: stable  Diet recommendation: heart healthy diet  Filed Weights   09/17/13 0443 09/18/13 0950 09/19/13 0606  Weight: 59.204 kg (130 lb 8.3 oz) 59.739 kg (131 lb 11.2 oz) 55.52 kg (122 lb 6.4 oz)    History of present illness:   [name] is a 73 y.o. female with PMH of CAD; carotid artery disease; HTN;  and recent admission  for small bowel obstruction s/p colectomy and with colostomy and also developed acute hypoxic respiratory failure requiring tracheostomy status post decannulation prior to discharge; DVT of right axillary vein, not anticoagulated due to intracranial hemorrhage managed conservatively. Patient was discharged on 08/26/13 to nursing home. Patient presented to the [facility] ED with nausea, vomiting, and abdominal pain. She denied fever, chills, chest pain, shortness of breath, diarrhea or constipation. CT abdomen showed small bowel obstruction versus ileus with possible transition point.  She failed by mouth challenge with recurrence of nausea and vomiting and abdominal pain, and therefore NG tube was inserted.  General surgery recommended that the patient to be admitted and the patient was transferred from [facility] ER to [facility].  And at her baseline independent for most of her ADL.   Hospital Course:   SBO (small bowel obstruction)  - likely secondary to adhesions/scar tissue from recent  surgery.  She was admitted, kept NPO, NG tube placed for decompression, other supportive measures. Significantly improved and had stool in colostomy bag. Diet was then advanced and on discharge, she tolerated regular diet with no n/v/ or abdominal pain  CAD (coronary artery disease)  -stable-with no chest pain/SOB.  Have resumed nitroglycerin, clopidogrel, lipitor and labetelol on discharge. Please note, patient was already on Plavix prior to this admission-claims that this was started by her primary care physician. Patient had a drug-eluting stent placed in December of 2014    Anemia  -Hgb 9.2; possibly secondary to recent critical illness/chronic disease-no evidence of acute blood loss.  -follow up with PCP  Recent very small SAH vs SDH seen on CT head from 7/23  -no deficits on exam.Please note, patient was already on Plavix prior to this admission-claims that this was started by her primary care physician- this will be resumed on discharge  HTN  -previously on lopressor-currently not on any anti-hypertensives- will resume lupus on discharge as blood pressure creeping up and should be able to tolerate low-dose Lopressor.   Recent hx of DVT of axillary vein, right  -etiology likely due to Right IJ central line that had been inserted on 7/14;  -not started on anticoagulation last admit due to GI issues, and small SAH vs SDH seen on CT Head  -no edema seen on right arm   Depression  -resume home regimen of cymbalta  Barrett's Esophagus  -on PPI   Moderate protein calorie malnutrition - Continue with supplements  Recent hx of Colonic obstruction due to suspected colitis; s/p colectomy/colostomy   Recent H/O tracheostomy-now decannulated  Procedures:  None  Consultations:  General surgery  Discharge Exam: Filed Vitals:   09/19/13 0606  BP: 149/70  Pulse: 89  Temp: 98.6 F (37 C)  Resp: 18     Exam General: Well-developed and nourished.  Eyes: Anicteric account.   Cardiovascular: Regular rate and rhythm.  No murmurs, rubs, or gallops. Respiratory: Clear to auscultate bilaterally.  No rhonchi or crepitations. Abdomen: Soft nontender bowel sounds present.  laparotomy wound with clean dry dressing.  Colostomy bag with stool  Musculoskeletal: No edema.  Psychiatric: Appears normal.  Neurologic: Alert awake oriented to time place and person.    Discharge Instructions You were cared for by a hospitalist during your hospital stay. If you have any questions about your discharge medications or the care you received while you were in the hospital after you are discharged, you can call the unit and asked to speak with the hospitalist on call if the hospitalist that took care of you is not available. Once you are discharged, your primary care physician will handle any further medical issues. Please note that NO REFILLS for any discharge medications will be authorized once you are discharged, as it is imperative that you return to your primary care physician (or establish a relationship with a primary care physician if you do not have one) for your aftercare needs so that they can reassess your need for medications and monitor your lab values.      Discharge Instructions   Call MD for:  persistant nausea and vomiting    Complete by:  As directed      Call MD for:  severe uncontrolled pain    Complete by:  As directed      Diet - low sodium heart healthy    Complete by:  As directed      Increase activity slowly    Complete by:  As directed             Medication List    STOP taking these medications       oxycodone 5 MG capsule  Commonly known as:  OXY-IR      TAKE these medications       acetaminophen 325 MG tablet  Commonly known as:  TYLENOL  Take 2 tablets (650 mg total) by mouth every 6 (six) hours as needed for mild pain, fever or headache.     atorvastatin 80 MG tablet  Commonly known as:  LIPITOR  Take 80 mg by mouth every evening.      clopidogrel 75 MG tablet  Commonly known as:  PLAVIX  Take 75 mg by mouth daily.     DULoxetine 60 MG capsule  Commonly known as:  CYMBALTA  Take 60 mg by mouth daily.     feeding supplement (ENSURE COMPLETE) Liqd  Take 237 mLs by mouth 2 (two) times daily between meals.     fish oil-omega-3 fatty acids 1000 MG capsule  Take 1,000 mg by mouth daily.     folic acid 696 MCG tablet  Commonly known as:  FOLVITE  Take 800 mcg by mouth daily.     ipratropium-albuterol 0.5-2.5 (3) MG/3ML Soln  Commonly known as:  DUONEB  Take 3 mLs by nebulization every 6 (six) hours as needed (for shortness of breath).     lip balm Oint  Apply 1 application topically 2 (two) times daily.     metoprolol tartrate 12.5 mg Tabs tablet  Commonly known as:  LOPRESSOR  Take 0.5 tablets (12.5 mg total) by mouth 2 (two)  times daily.     nitroGLYCERIN 0.4 MG SL tablet  Commonly known as:  NITROSTAT  Place 1 tablet (0.4 mg total) under the tongue every 5 (five) minutes as needed for chest pain.     ondansetron 4 MG tablet  Commonly known as:  ZOFRAN  Take 4 mg by mouth every 8 (eight) hours as needed for nausea or vomiting.     oxyCODONE-acetaminophen 5-325 MG per tablet  Commonly known as:  PERCOCET/ROXICET  Take 1-2 tablets by mouth every 4 (four) hours as needed for moderate pain.     pantoprazole 40 MG tablet  Commonly known as:  PROTONIX  Take 40 mg by mouth daily.     zaleplon 10 MG capsule  Commonly known as:  SONATA  Take 10 mg by mouth at bedtime.       Allergies  Allergen Reactions  . Pregabalin Swelling    Tongue swelling  . Sulfonamide Derivatives Swelling    Childhood reaction   Follow-up Information   Follow up with MOON,AMY, NP.   Specialty:  Internal Medicine   Contact information:   [address], [place] [address] [phone]       Follow up with [facility], MD. Schedule an appointment as soon as possible for a visit in 2 weeks.    Specialty:  General Surgery   Contact information:   [address] [place] [address] [phone]        The results of significant diagnostics from this hospitalization (including imaging, microbiology, ancillary and laboratory) are listed below for reference.    Significant Diagnostic Studies: Dg Chest Port 1 View  08/21/2013   CLINICAL DATA:  Shortness of breath, tracheostomy  EXAM: PORTABLE CHEST - 1 VIEW  COMPARISON:  08/17/2013  FINDINGS: Cardiomegaly with pulmonary vascular congestion and suspected mild interstitial edema. Patchy bilateral lower lobe opacities, possibly atelectasis, left lower lobe pneumonia not excluded. Small bilateral pleural effusions, left greater than right. No pneumothorax.  Tracheostomy in satisfactory position.  Right IJ venous catheter terminates at the cavoatrial junction.  Enteric tube courses into the stomach.  IMPRESSION: Cardiomegaly with mild interstitial edema.  Patchy bilateral lower lobe opacities, possibly atelectasis, left lower lobe not excluded.  Small bilateral pleural effusions, left greater than right.  Tracheostomy in satisfactory position. Additional support apparatus as above.   Electronically Signed   By: Julian Hy M.D.   On: 08/21/2013 10:37   Dg Abd 2 Views  09/18/2013   CLINICAL DATA:  73 year old female status post surgery, small bowel obstruction. Initial encounter.  EXAM: ABDOMEN - 2 VIEW  COMPARISON:  09/17/2013 and earlier.  FINDINGS: Enteric tube removed. Small pleural effusions. Mildly increased streaky bibasilar opacity. Stable visible mediastinal contours.  Left abdominal ostomy again noted. Non obstructed bowel gas pattern. Stable right upper quadrant clips. Stable visualized osseous structures.  IMPRESSION: 1. Enteric tube removed. 2.  Non obstructed bowel gas pattern, no free air. 3. New small bilateral pleural effusions with bibasilar atelectasis.   Electronically Signed   By: Lars Pinks M.D.   On: 09/18/2013  07:45   Dg Abd Portable 1v  09/17/2013   CLINICAL DATA:  NG tube placement.  EXAM: PORTABLE ABDOMEN - 1 VIEW  COMPARISON:  09/17/2013  FINDINGS: Enteric tube has been advanced since previous study. Tip is now located in the left upper quadrant consistent with location in the stomach. Proximal side hole remains in the lower chest, probably in the distal esophagus or  perhaps in a hiatal hernia. Bowel gas pattern is unchanged. Surgical clips in the right upper quadrant.  IMPRESSION: Enteric tube tip localizes over the upper stomach with proximal side hole probably in the distal esophagus.   Electronically Signed   By: Lucienne Capers M.D.   On: 09/17/2013 05:49   Dg Abd Portable 2v  09/17/2013   CLINICAL DATA:  NG tube insertion it and followup small bowel obstruction.  EXAM: PORTABLE ABDOMEN - 2 VIEW  COMPARISON:  CT abdomen and pelvis 09/16/2013.  Abdomen 08/17/2013.  FINDINGS: Enteric tube tip is coiled in the lower chest consistent with location in the distal esophagus or possibly within a hiatal hernia. Stool-filled colon with with mild gaseous distention of left lower quadrant bowel. Left lower quadrant ostomy is present. Residual contrast material in the bladder and renal collecting systems, likely from recent CT scan. Air-fluid levels are demonstrated. No free intra-abdominal air.  IMPRESSION: Persistent changes consistent with small bowel obstruction. Enteric tube tip appears to be coiled in the distal esophagus.   Electronically Signed   By: Lucienne Capers M.D.   On: 09/17/2013 03:50   Dg Swallowing Func-speech Pathology  08/23/2013   Katherene Ponto Deblois, CCC-SLP     08/23/2013 10:37 AM Objective Swallowing Evaluation: Modified Barium Swallowing Study   Patient Details  Name: AMBREE FRANCES MRN: [id number] Date of Birth: [birth year]  Today's Date: 08/23/2013 Time: 0935-1000 SLP Time Calculation (min): 25 min  Past Medical History:  Past Medical History  Diagnosis Date  . Carotid artery disease      a. 60-70% bilat ICA stenosis by dopplers 05/2012.  Marland Kitchen Other and unspecified hyperlipidemia   . CAD (coronary artery disease)     a. Mod dz 2010 initially mgd medically. b. 12/2012 - angina s/p  PTCA/DES to mid-circumflex, PTCA/DES to first OM.   Marland Kitchen Hypertension   . Heart murmur   . Depression   . Barrett's esophagus   . Arthritis    Past Surgical History:  Past Surgical History  Procedure Laterality Date  . Appendectomy    . Cholecystectomy    . Colon resection    . 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 Rotator cuff repair Left   . Abdominal hysterectomy    . Colon surgery    . Foot surgery    . Hand surgery    . Laparotomy N/A 08/01/2013    Procedure: EXPLORATORY LAPAROTOMY;  Surgeon: Harl Bowie, MD;  Location: [place];  Service: General;  Laterality:  N/A;  . Partial colectomy N/A 08/01/2013    Procedure: PARTIAL COLECTOMY;  Surgeon: Harl Bowie, MD;   Location: [place];  Service: General;  Laterality: N/A;  . Colostomy N/A 08/01/2013    Procedure: COLOSTOMY;  Surgeon: Harl Bowie, MD;   Location: [place];  Service: General;  Laterality: N/A;  . Bowel resection N/A 08/01/2013    Procedure: SMALL BOWEL RESECTION;  Surgeon: Harl Bowie,  MD;  Location: [place];  Service: General;  Laterality: N/A;  . Tracheostomy      feinstein   HPI:  73 y/o female with PMH CAD, HTN, Barrett's esophagus, presented  to [facility] ED 7/10 with LLQ pain. Found to have bowel obstruction. To  OR for repair 7/13 (Laparotomy, partial colectomy, colostomy, SB  resection, repair of enterotomy, lysis of adhesions). Pt was  extubated on 7/14 but required  reintubation until trach on 7/24.  Trach collar trials on 7/26. Pt also found to have a small ICH on  CT.      Assessment / Plan / Recommendation Clinical Impression  Dysphagia Diagnosis: Within Functional Limits Clinical impression: Pt demonstrates normal swallow function,  though she did have intermittent coughing mid study  unrelated to  any penetration or aspiration. NG tube was removed mid study to  ensure best swallow function. Given ongoing generalized weakness,  recommend soft dys 3 diet with thin liquids. Pt must wear PMSV  during PO intake with full supervision, though pt should attempt  to feed herself. SLP will f/u for tolerance and upgrade to  regular diet as pts endurance progresses. (Also confirmed with  surgery PA that pt is ready for regular diet).     Treatment Recommendation  Therapy as outlined in treatment plan below    Diet Recommendation Dysphagia 3 (Mechanical Soft);Thin liquid   Liquid Administration via: Cup;Straw Medication Administration: Whole meds with liquid Supervision: Patient able to self feed;Full supervision/cueing  for compensatory strategies Compensations: Slow rate;Small sips/bites Postural Changes and/or Swallow Maneuvers: Seated upright 90  degrees    Other  Recommendations Oral Care Recommendations: Oral care BID Other Recommendations: Place PMSV during PO intake   Follow Up Recommendations  Inpatient Rehab    Frequency and Duration min 2x/week  2 weeks   Pertinent Vitals/Pain NA    SLP Swallow Goals     General HPI: 73 y/o female with PMH CAD, HTN, Barrett's  esophagus, presented to [facility] ED 7/10 with LLQ pain. Found to have  bowel obstruction. To OR for repair 7/13 (Laparotomy, partial  colectomy, colostomy, SB resection, repair of enterotomy, lysis  of adhesions). Pt was extubated on 7/14 but required reintubation  until trach on 7/24. Trach collar trials on 7/26. Pt also found  to have a small ICH on CT.  Type of Study: Modified Barium Swallowing Study Reason for Referral: Objectively evaluate swallowing function Diet Prior to this Study: NPO;Panda Temperature Spikes Noted: No Respiratory Status: Trach collar Trach Size and Type: #4;Uncuffed;With PMSV in place History of Recent Intubation: Yes Length of Intubations (days): 12 days Date extubated: 09/12/13 Behavior/Cognition:  Alert;Cooperative;Pleasant mood Oral Cavity - Dentition: Dentures, top Oral Motor / Sensory Function: Within functional limits Self-Feeding Abilities: Able to feed self Patient Positioning: Upright in chair Baseline Vocal Quality: Low vocal intensity Volitional Cough: Weak Volitional Swallow: Able to elicit Anatomy: Within functional limits    Reason for Referral Objectively evaluate swallowing function   Oral Phase Oral Preparation/Oral Phase Oral Phase: WFL   Pharyngeal Phase Pharyngeal Phase Pharyngeal Phase: Within functional limits  Cervical Esophageal Phase    GO    Cervical Esophageal Phase Cervical Esophageal Phase: [facility]        Herbie Baltimore, MA CCC-SLP [phone]  DeBlois, Katherene Ponto 08/23/2013, 10:18 AM     Microbiology: No results found for this or any previous visit (from the past 240 hour(s)).   Labs: Basic Metabolic Panel:  Recent Labs Lab 09/17/13 0711 09/18/13 0610 09/19/13 1010  NA 145 140 141  K 3.3* 3.2* 3.0*  CL 107 103 105  CO2 26 24 22   GLUCOSE 92 71 121*  BUN 12 5* 3*  CREATININE 0.47* 0.44* 0.41*  CALCIUM 8.0* 7.9* 8.3*  MG 1.2*  --   --    Liver Function Tests:  Recent Labs Lab 09/17/13 0711 09/18/13 0610  AST 14 23  ALT 9 8  ALKPHOS 108 96  BILITOT 0.4 0.4  PROT 4.8* 4.7*  ALBUMIN 2.2* 2.0*   No results found for this basename: LIPASE, AMYLASE,  in the last 168 hours No results found for this basename: AMMONIA,  in the last 168 hours CBC:  Recent Labs Lab 09/17/13 0711 09/18/13 0610  WBC 6.8 5.5  NEUTROABS 4.6  --   HGB 9.7* 9.2*  HCT 30.4* 28.2*  MCV 86.6 85.7  PLT 278 300   Cardiac Enzymes: No results found for this basename: CKTOTAL, CKMB, CKMBINDEX, TROPONINI,  in the last 168 hours BNP: BNP (last 3 results)  Recent Labs  08/03/13 0930  PROBNP 3782.0*   CBG: No results found for this basename: GLUCAP,  in the last 168 hours   Signed: Lacy Duverney PA-C  [facility] 09/19/2013, 3:00 PM  Attending Patient was  seen, examined,treatment plan was discussed with the Physician extender. I have directly reviewed the clinical findings, lab, imaging studies and management of this patient in detail. I have made the necessary changes to the above noted documentation, and agree with the documentation, as recorded by the Physician extender.  Nena Alexander MD [facility].

## 2013-09-24 LAB — AFB CULTURE WITH SMEAR (NOT AT ARMC): ACID FAST SMEAR: NONE SEEN

## 2013-10-10 ENCOUNTER — Other Ambulatory Visit: Payer: Self-pay | Admitting: Cardiology

## 2013-10-12 ENCOUNTER — Telehealth: Payer: Self-pay

## 2013-10-12 ENCOUNTER — Other Ambulatory Visit: Payer: Self-pay

## 2013-10-12 MED ORDER — CLOPIDOGREL BISULFATE 75 MG PO TABS: 75.0000 mg | ORAL_TABLET | Freq: Every day | ORAL | Status: DC

## 2013-10-12 NOTE — Telephone Encounter (Signed)
She should be taking Plavix, Ivy could you please call her and if not she needs to be restarted.

## 2013-10-20 ENCOUNTER — Emergency Department (HOSPITAL_COMMUNITY)
Admission: EM | Admit: 2013-10-20 | Discharge: 2013-10-20 | Disposition: A | Discharge status: Home / Self Care | Payer: Medicare HMO | Attending: Emergency Medicine | Admitting: Emergency Medicine

## 2013-10-20 ENCOUNTER — Encounter: Payer: Self-pay | Admitting: Physician Assistant

## 2013-10-20 ENCOUNTER — Emergency Department (HOSPITAL_COMMUNITY): Payer: Medicare HMO

## 2013-10-20 ENCOUNTER — Other Ambulatory Visit: Payer: Self-pay | Admitting: Cardiology

## 2013-10-20 ENCOUNTER — Encounter: Payer: Commercial Managed Care - HMO | Admitting: Physician Assistant

## 2013-10-20 ENCOUNTER — Encounter (HOSPITAL_COMMUNITY): Payer: Self-pay | Admitting: Emergency Medicine

## 2013-10-20 ENCOUNTER — Telehealth: Payer: Self-pay | Admitting: Cardiology

## 2013-10-20 DIAGNOSIS — Z9861 Coronary angioplasty status: Secondary | ICD-10-CM | POA: Diagnosis not present

## 2013-10-20 DIAGNOSIS — Z79899 Other long term (current) drug therapy: Secondary | ICD-10-CM | POA: Diagnosis not present

## 2013-10-20 DIAGNOSIS — Z720 Tobacco use: Secondary | ICD-10-CM | POA: Diagnosis not present

## 2013-10-20 DIAGNOSIS — R0789 Other chest pain: Secondary | ICD-10-CM

## 2013-10-20 DIAGNOSIS — Z86718 Personal history of other venous thrombosis and embolism: Secondary | ICD-10-CM | POA: Insufficient documentation

## 2013-10-20 DIAGNOSIS — I2510 Atherosclerotic heart disease of native coronary artery without angina pectoris: Secondary | ICD-10-CM | POA: Insufficient documentation

## 2013-10-20 DIAGNOSIS — Z87828 Personal history of other (healed) physical injury and trauma: Secondary | ICD-10-CM | POA: Insufficient documentation

## 2013-10-20 DIAGNOSIS — R600 Localized edema: Secondary | ICD-10-CM

## 2013-10-20 DIAGNOSIS — F329 Major depressive disorder, single episode, unspecified: Secondary | ICD-10-CM | POA: Diagnosis not present

## 2013-10-20 DIAGNOSIS — E785 Hyperlipidemia, unspecified: Secondary | ICD-10-CM | POA: Diagnosis not present

## 2013-10-20 DIAGNOSIS — K2270 Barrett's esophagus without dysplasia: Secondary | ICD-10-CM | POA: Insufficient documentation

## 2013-10-20 DIAGNOSIS — Z8709 Personal history of other diseases of the respiratory system: Secondary | ICD-10-CM | POA: Diagnosis not present

## 2013-10-20 DIAGNOSIS — Z8679 Personal history of other diseases of the circulatory system: Secondary | ICD-10-CM | POA: Insufficient documentation

## 2013-10-20 DIAGNOSIS — R609 Edema, unspecified: Secondary | ICD-10-CM | POA: Diagnosis not present

## 2013-10-20 DIAGNOSIS — R079 Chest pain, unspecified: Secondary | ICD-10-CM | POA: Insufficient documentation

## 2013-10-20 DIAGNOSIS — D649 Anemia, unspecified: Secondary | ICD-10-CM | POA: Diagnosis not present

## 2013-10-20 DIAGNOSIS — Z7902 Long term (current) use of antithrombotics/antiplatelets: Secondary | ICD-10-CM | POA: Insufficient documentation

## 2013-10-20 DIAGNOSIS — Z9089 Acquired absence of other organs: Secondary | ICD-10-CM | POA: Insufficient documentation

## 2013-10-20 LAB — BASIC METABOLIC PANEL
ANION GAP: 11 (ref 5–15)
BUN: 12 mg/dL (ref 6–23)
CALCIUM: 8.5 mg/dL (ref 8.4–10.5)
CHLORIDE: 112 meq/L (ref 96–112)
CO2: 24 mEq/L (ref 19–32)
Creatinine, Ser: 0.6 mg/dL (ref 0.50–1.10)
GFR calc non Af Amer: 88 mL/min — ABNORMAL LOW (ref >90)
Glucose, Bld: 122 mg/dL — ABNORMAL HIGH (ref 70–99)
Potassium: 3.7 mEq/L (ref 3.7–5.3)
Sodium: 147 mEq/L (ref 137–147)

## 2013-10-20 LAB — I-STAT TROPONIN, ED: Troponin i, poc: 0.01 ng/mL (ref 0.00–0.08)

## 2013-10-20 LAB — HEPATIC FUNCTION PANEL
ALBUMIN: 2.6 g/dL — AB (ref 3.5–5.2)
ALK PHOS: 89 U/L (ref 39–117)
ALT: 13 U/L (ref 0–35)
AST: 27 U/L (ref 0–37)
Total Protein: 6 g/dL (ref 6.0–8.3)

## 2013-10-20 LAB — CBC
HCT: 30.1 % — ABNORMAL LOW (ref 36.0–46.0)
HEMOGLOBIN: 9.3 g/dL — AB (ref 12.0–15.0)
MCH: 27.1 pg (ref 26.0–34.0)
MCHC: 30.9 g/dL (ref 30.0–36.0)
MCV: 87.8 fL (ref 78.0–100.0)
PLATELETS: 384 10*3/uL (ref 150–400)
RBC: 3.43 MIL/uL — ABNORMAL LOW (ref 3.87–5.11)
RDW: 16.8 % — ABNORMAL HIGH (ref 11.5–15.5)
WBC: 6.3 10*3/uL (ref 4.0–10.5)

## 2013-10-20 LAB — PRO B NATRIURETIC PEPTIDE: Pro B Natriuretic peptide (BNP): 379.5 pg/mL — ABNORMAL HIGH (ref 0–125)

## 2013-10-20 LAB — TROPONIN I: Troponin I: <0.3 ng/mL (ref <0.30)

## 2013-10-20 LAB — D-DIMER, QUANTITATIVE (NOT AT ARMC): D DIMER QUANT: 2.78 ug{FEU}/mL — AB (ref 0.00–0.48)

## 2013-10-20 MED ORDER — ASPIRIN EC 325 MG PO TBEC
325.0000 mg | DELAYED_RELEASE_TABLET | Freq: Once | ORAL | Status: AC
  Administered 2013-10-20: 325 mg via ORAL
  Filled 2013-10-20: qty 1

## 2013-10-20 MED ORDER — IOHEXOL 350 MG/ML SOLN
100.0000 mL | Freq: Once | INTRAVENOUS | Status: AC | PRN
  Administered 2013-10-20: 100 mL via INTRAVENOUS

## 2013-10-20 NOTE — Telephone Encounter (Signed)
Spoke with PA Melina Copa, and ok to bring pt in for an OV today with her at 2pm.  Scheduled appt for 2 pm and notation made to do EKG at that time.  Pt aware of this appt and very gracious for all the assistance provided .  Will further update Dr Meda Coffee by routing this message for her to review.

## 2013-10-20 NOTE — ED Provider Notes (Signed)
Medical screening examination/treatment/procedure(s) were conducted as a shared visit with resident physician and myself.  I personally evaluated the patient during the encounter.   EKG Interpretation  Date/Time:  Thursday October 20 2013 13:45:53 EDT Ventricular Rate:  103 PR Interval:  168 QRS Duration: 70 QT Interval:  356 QTC Calculation: 466 R Axis:   74 Text Interpretation:  Sinus tachycardia Otherwise normal ECG No significant change since last tracing Confirmed by Seymone Forlenza  MD, [place] ([id number]) on 10/20/2013 2:47:59 PM      I interviewed and examined the patient. Lungs are CTAB. Cardiac exam wnl. Abdomen soft.  Cards consulted who recommends delta trop. If neg. D/c home w/ outpt f/u.  Pamella Pert, MD 10/20/13 [phone]

## 2013-10-20 NOTE — Telephone Encounter (Signed)
New Message  Pt called. States that she is swimmy headed.. Feet and legs are swelling and has been for a week. Pt also reports she has tightness in her chest. Please call back to discuss.

## 2013-10-20 NOTE — Consult Note (Cosign Needed)
Reason for Consult: Chest Pain Referring Physician: [facility] ED   HPI: The patient is a 73 y/o female, formerly followed by Dr. Daleen Squibb and now by Dr. Delton See. She has a PMH significant for CAD s/p LHC in 12/2012 resulting in DES to the mid left circumflex and DES to the OM1. She was noted to have moderate residual disease with 30-40% mid LAD stenosis and 50% in the first diag as well as 50% mid RCA stenosis. LV function was noted to be normal at 55-60%. She also has treated HTN, HLD and is a smoker. She had a SBO in April of this year and required partial colon resection and now has a colostomy bag.    She presents to the [facility] ER today with a complaint of left sided chest pain radiating to her left scapula. Described as pressure-like. No radiation and to her arms, neck or jaw. No associated dyspnea, diaphoresis, nausea or vomiting. Not worsened by exertion. Exacerbating factors include deep breathing and palpation of the chest wall. Her left scapular pain is improved after her husband massages the area. In retrospect, she recalls suffering 2 falls within the past 30 days which she believes may have been related to mechanical issues. The first fall was 3 weeks ago which correlates with the timing and of the onset of her left scapular pain. She recalls landing on her back at that time. The second fall was 1 week ago and correlates to the start of her left-sided chest pain. She remembers falling on her left side at that time. She has not tried anything at home for pain. She reports that she has been fully compliant with her medications including DAPT with aspirin and Plavix. Unfortunately she continues to smoke on average 2-3 cigarettes per day.  Her EKG in the ER demonstrates sinus tachycardia. Heart rate is 103. No ischemic abnormalities noted. Initial troponin is negative   Past Medical History  Diagnosis Date  . Carotid artery disease     a. 60-70% bilat ICA stenosis by dopplers 05/2012.  Marland Kitchen Other and  unspecified hyperlipidemia   . CAD (coronary artery disease)     a. Mod dz 2010 initially mgd medically. b. 12/2012 - angina s/p PTCA/DES to mid-circumflex, PTCA/DES to first OM.   Marland Kitchen Hypertension   . Depression   . Barrett's esophagus   . SBO (small bowel obstruction)     a. Lysis of adhesions and ovarian cystectomy 04/2013 for sBO. b. Admitted 07/2013 for colonic obstruction due to suspected colitis, s/p partial colectomy/colostomy 08/01/13. Admission complicated by anasarca, acute resp failure requiring tracheostomy, decannulated 08/25/13. c. Recurrent SBO8/2015, NGT placed for decompression.   . Colonic obstruction due to suspected colitis; s/p colectomy/colostomy     a. See SBO.  Marland Kitchen Protein-calorie malnutrition, severe w/ electrolyte imbalance     a. Severe hypoalbuminemia leading to 3rd spacing including anasarca and pulm edema 07/2013.  Marland Kitchen HTN (hypertension)   . DVT of axillary vein, acute right     a. Dx 07/2013 felt due to R IJ central line that had been inserted 7/14. Not on anticoag due to GIB issues and small cerebral bleed.  . SBO (small bowel obstruction)   . Acute respiratory failure with hypoxia s/p tracheostomy     a. 07/2013 during admission for colonic obstruction.  . Intracranial hemorrhage     a. 08/11/13 - per DC summary, tiny SAH vs SDH done for altered mentation, anticoag stopped including aspirin.  . Anemia     a. 7-08/2013  felt due to recent critical illness/chronic disease.    Past Surgical History  Procedure Laterality Date  . Appendectomy    . Cholecystectomy    . Colon resection    . 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 Rotator cuff repair Left   . Abdominal hysterectomy    . Colon surgery    . Foot surgery    . Hand surgery    . Laparotomy N/A 08/01/2013    Procedure: EXPLORATORY LAPAROTOMY;  Surgeon: Harl Bowie, MD;  Location: [place];  Service: General;  Laterality: N/A;  . Partial colectomy N/A  08/01/2013    Procedure: PARTIAL COLECTOMY;  Surgeon: Harl Bowie, MD;  Location: [place];  Service: General;  Laterality: N/A;  . Colostomy N/A 08/01/2013    Procedure: COLOSTOMY;  Surgeon: Harl Bowie, MD;  Location: [place];  Service: General;  Laterality: N/A;  . Bowel resection N/A 08/01/2013    Procedure: SMALL BOWEL RESECTION;  Surgeon: Harl Bowie, MD;  Location: [place];  Service: General;  Laterality: N/A;  . Tracheostomy      feinstein    Family History  Problem Relation Age of Onset  . Heart disease      Social History:  reports that she has been smoking Cigarettes.  She has been smoking about 0.00 packs per day. She has never used smokeless tobacco. She reports that she does not drink alcohol or use illicit drugs.  Allergies:  Allergies  Allergen Reactions  . Pregabalin Swelling    Tongue swelling  . Sulfonamide Derivatives Swelling    Childhood reaction    Medications:  Prior to Admission medications   Medication Sig Start Date End Date Taking? Authorizing Provider  acetaminophen (TYLENOL) 325 MG tablet Take 2 tablets (650 mg total) by mouth every 6 (six) hours as needed for mild pain, fever or headache. 08/26/13  Yes Samella Parr, NP  aspirin EC 81 MG tablet Take 162 mg by mouth daily.   Yes Historical Provider, MD  atorvastatin (LIPITOR) 80 MG tablet Take 80 mg by mouth every evening.    Yes Historical Provider, MD  clopidogrel (PLAVIX) 75 MG tablet Take 1 tablet (75 mg total) by mouth daily. 10/12/13  Yes Dorothy Spark, MD  dicyclomine (BENTYL) 10 MG capsule Take 10 mg by mouth 4 (four) times daily -  before meals and at bedtime.   Yes Historical Provider, MD  DULoxetine (CYMBALTA) 60 MG capsule Take 60 mg by mouth daily. 09/14/13  Yes Historical Provider, MD  fish oil-omega-3 fatty acids 1000 MG capsule Take 1,000 mg by mouth daily.    Yes Historical Provider, MD  folic acid (FOLVITE) 564 MCG tablet Take 800 mcg by mouth daily.    Yes Historical  Provider, MD  furosemide (LASIX) 20 MG tablet Take 20 mg by mouth daily as needed for fluid.  10/17/13  Yes Historical Provider, MD  ipratropium-albuterol (DUONEB) 0.5-2.5 (3) MG/3ML SOLN Take 3 mLs by nebulization every 6 (six) hours as needed (for shortness of breath).   Yes Historical Provider, MD  lip balm (BLISTEX) OINT Apply 1 application topically 2 (two) times daily. 08/26/13  Yes Samella Parr, NP  metoprolol succinate (TOPROL-XL) 25 MG 24 hr tablet Take 25 mg by mouth daily.   Yes Historical Provider, MD  nitroGLYCERIN (NITROSTAT) 0.4 MG SL tablet Place 1 tablet (0.4 mg total) under  the tongue every 5 (five) minutes as needed for chest pain. 12/13/12  Yes Dorothy Spark, MD  ondansetron (ZOFRAN) 4 MG tablet Take 4 mg by mouth every 6 (six) hours as needed for nausea or vomiting.    Yes Historical Provider, MD  oxyCODONE-acetaminophen (PERCOCET/ROXICET) 5-325 MG per tablet Take 1-2 tablets by mouth every 4 (four) hours as needed for moderate pain.  09/14/13  Yes Historical Provider, MD  pantoprazole (PROTONIX) 40 MG tablet Take 40 mg by mouth 2 (two) times daily.   Yes Historical Provider, MD  potassium chloride SA (K-DUR,KLOR-CON) 20 MEQ tablet Take 20 mEq by mouth 3 (three) times daily.   Yes Historical Provider, MD  zaleplon (SONATA) 10 MG capsule Take 20 mg by mouth at bedtime.  09/14/13  Yes Historical Provider, MD       Results for orders placed during the hospital encounter of 10/20/13 (from the past 48 hour(s))  CBC     Status: Abnormal   Collection Time    10/20/13  1:48 PM      Result Value Ref Range   WBC 6.3  4.0 - 10.5 K/uL   RBC 3.43 (*) 3.87 - 5.11 MIL/uL   Hemoglobin 9.3 (*) 12.0 - 15.0 g/dL   HCT 30.1 (*) 36.0 - 46.0 %   MCV 87.8  78.0 - 100.0 fL   MCH 27.1  26.0 - 34.0 pg   MCHC 30.9  30.0 - 36.0 g/dL   RDW 16.8 (*) 11.5 - 15.5 %   Platelets 384  150 - 400 K/uL  BASIC METABOLIC PANEL     Status: Abnormal   Collection Time    10/20/13  1:48 PM      Result  Value Ref Range   Sodium 147  137 - 147 mEq/L   Potassium 3.7  3.7 - 5.3 mEq/L   Chloride 112  96 - 112 mEq/L   CO2 24  19 - 32 mEq/L   Glucose, Bld 122 (*) 70 - 99 mg/dL   BUN 12  6 - 23 mg/dL   Creatinine, Ser 0.60  0.50 - 1.10 mg/dL   Calcium 8.5  8.4 - 10.5 mg/dL   GFR calc non Af Amer 88 (*) >90 mL/min   GFR calc Af Amer >90  >90 mL/min   Comment: (NOTE)     The eGFR has been calculated using the CKD EPI equation.     This calculation has not been validated in all clinical situations.     eGFR's persistently <90 mL/min signify possible Chronic Kidney     Disease.   Anion gap 11  5 - 15  I-STAT TROPOININ, ED     Status: None   Collection Time    10/20/13  2:04 PM      Result Value Ref Range   Troponin i, poc 0.01  0.00 - 0.08 ng/mL   Comment 3            Comment: Due to the release kinetics of cTnI,     a negative result within the first hours     of the onset of symptoms does not rule out     myocardial infarction with certainty.     If myocardial infarction is still suspected,     repeat the test at appropriate intervals.    Dg Chest 2 View  10/20/2013   CLINICAL DATA:  73 year old female with chest pain, left-sided.  EXAM: CHEST - 2 VIEW  COMPARISON:  Plain film 09/16/2013,  CT abdomen 09/16/2013. Chest CT 08/11/2013  FINDINGS: Cardiomediastinal silhouette unchanged in size and contour. Atherosclerotic calcifications of the aortic arch. Coronary stents visible.  No evidence of pulmonary vascular congestion.  No confluent airspace disease.  No pneumothorax.  Pleural parenchymal scarring at the apex. Changes of emphysema including flattening of the hemidiaphragms and increased AP diameter.  No acute bony abnormality.  Surgical changes of cholecystectomy.  IMPRESSION: No radiographic evidence of acute cardiopulmonary disease.  Atherosclerosis and coronary artery disease.  Emphysema.  Signed,  Dulcy Fanny. Earleen Newport, DO  [facility]  [facility]  Radiology   Electronically Signed   By: Corrie Mckusick O.D.   On: 10/20/2013 15:52    Review of Systems  Constitutional: Negative for diaphoresis.  Respiratory: Negative for shortness of breath.   Cardiovascular: Positive for chest pain. Negative for palpitations, orthopnea and PND.  Gastrointestinal: Negative for nausea and vomiting.  Musculoskeletal: Positive for back pain and falls. Negative for neck pain.  Neurological: Negative for loss of consciousness.  All other systems reviewed and are negative.  Blood pressure 139/55, pulse 98, temperature 98.7 F (37.1 C), temperature source Oral, resp. rate 25, SpO2 100.00%. Physical Exam  Constitutional: She is oriented to person, place, and time. She appears well-developed and well-nourished. No distress.  Neck: No JVD present. Carotid bruit is not present.  Cardiovascular: Normal rate, regular rhythm and intact distal pulses.  Exam reveals no gallop and no friction rub.   No murmur heard. Pulses:      Radial pulses are 2+ on the right side, and 2+ on the left side.       Dorsalis pedis pulses are 2+ on the right side, and 2+ on the left side.  Respiratory: Effort normal and breath sounds normal. No respiratory distress. She has no wheezes. She has no rales. She exhibits tenderness (left sided chest and lateral aspect of her left thorax).  GI: Soft. Bowel sounds are normal.  Musculoskeletal: She exhibits no edema.  Neurological: She is alert and oriented to person, place, and time.  Skin: Skin is warm and dry. She is not diaphoretic.  Psychiatric: She has a normal mood and affect. Her behavior is normal.    Assessment/Plan: Active Problems:   Atypical chest pain    See MD recs below. Stephfon Bovey 10/20/2013   Patient seen with PA, agree with the above note. Patient has had left lower chest/left flank/left mid back discomfort that has been coming and going since a fall about a week ago. It is somewhat pleuritic. It is not  exertional. She fell on her left side. She is tender to palpation left lower chest/left mid-flank. ECG and troponin negative. I will have her get a D dimer and a repeat TnI in a couple of hours. If both are normal, she can go home with followup in our office for a Cardiolite.  Loralie Champagne  10/20/2013  4:14 PM

## 2013-10-20 NOTE — ED Provider Notes (Addendum)
I saw and evaluated the patient, reviewed the resident's note and I agree with the findings and plan.   EKG Interpretation   Date/Time:  Thursday October 20 2013 13:45:53 EDT Ventricular Rate:  103 PR Interval:  168 QRS Duration: 70 QT Interval:  356 QTC Calculation: 466 R Axis:   74 Text Interpretation:  Sinus tachycardia Otherwise normal ECG No  significant change since last tracing Confirmed by HARRISON  MD, FORREST  ([id number]) on 10/20/2013 2:47:59 PM      Results for orders placed during the hospital encounter of 10/20/13  CBC      Result Value Ref Range   WBC 6.3  4.0 - 10.5 K/uL   RBC 3.43 (*) 3.87 - 5.11 MIL/uL   Hemoglobin 9.3 (*) 12.0 - 15.0 g/dL   HCT 30.1 (*) 36.0 - 46.0 %   MCV 87.8  78.0 - 100.0 fL   MCH 27.1  26.0 - 34.0 pg   MCHC 30.9  30.0 - 36.0 g/dL   RDW 16.8 (*) 11.5 - 15.5 %   Platelets 384  150 - 400 K/uL  BASIC METABOLIC PANEL      Result Value Ref Range   Sodium 147  137 - 147 mEq/L   Potassium 3.7  3.7 - 5.3 mEq/L   Chloride 112  96 - 112 mEq/L   CO2 24  19 - 32 mEq/L   Glucose, Bld 122 (*) 70 - 99 mg/dL   BUN 12  6 - 23 mg/dL   Creatinine, Ser 0.60  0.50 - 1.10 mg/dL   Calcium 8.5  8.4 - 10.5 mg/dL   GFR calc non Af Amer 88 (*) >90 mL/min   GFR calc Af Amer >90  >90 mL/min   Anion gap 11  5 - 15  PRO B NATRIURETIC PEPTIDE      Result Value Ref Range   Pro B Natriuretic peptide (BNP) 379.5 (*) 0 - 125 pg/mL  HEPATIC FUNCTION PANEL      Result Value Ref Range   Total Protein 6.0  6.0 - 8.3 g/dL   Albumin 2.6 (*) 3.5 - 5.2 g/dL   AST 27  0 - 37 U/L   ALT 13  0 - 35 U/L   Alkaline Phosphatase 89  39 - 117 U/L   Total Bilirubin <0.2 (*) 0.3 - 1.2 mg/dL   Bilirubin, Direct <0.2  0.0 - 0.3 mg/dL   Indirect Bilirubin NOT CALCULATED  0.3 - 0.9 mg/dL  TROPONIN I      Result Value Ref Range   Troponin I <0.30  <0.30 ng/mL  D-DIMER, QUANTITATIVE      Result Value Ref Range   D-Dimer, Quant 2.78 (*) 0.00 - 0.48 ug/mL-FEU  I-STAT TROPOININ, ED       Result Value Ref Range   Troponin i, poc 0.01  0.00 - 0.08 ng/mL   Comment 3            Dg Chest 2 View  10/20/2013   CLINICAL DATA:  73 year old female with chest pain, left-sided.  EXAM: CHEST - 2 VIEW  COMPARISON:  Plain film 09/16/2013, CT abdomen 09/16/2013. Chest CT 08/11/2013  FINDINGS: Cardiomediastinal silhouette unchanged in size and contour. Atherosclerotic calcifications of the aortic arch. Coronary stents visible.  No evidence of pulmonary vascular congestion.  No confluent airspace disease.  No pneumothorax.  Pleural parenchymal scarring at the apex. Changes of emphysema including flattening of the hemidiaphragms and increased AP diameter.  No acute bony abnormality.  Surgical  changes of cholecystectomy.  IMPRESSION: No radiographic evidence of acute cardiopulmonary disease.  Atherosclerosis and coronary artery disease.  Emphysema.  Signed,  Dulcy Fanny. Earleen Newport, DO  [facility]  [facility] Radiology   Electronically Signed   By: Corrie Mckusick O.D.   On: 10/20/2013 15:52   Ct Angio Chest W/cm &/or Wo Cm  10/20/2013   CLINICAL DATA:  73 year old female with chest pain.  EXAM: CT ANGIOGRAPHY CHEST WITH CONTRAST  TECHNIQUE: Multidetector CT imaging of the chest was performed using the standard protocol during bolus administration of intravenous contrast. Multiplanar CT image reconstructions and MIPs were obtained to evaluate the vascular anatomy.  CONTRAST:  136mL OMNIPAQUE IOHEXOL 350 MG/ML SOLN  COMPARISON:  Prior chest CT 08/11/2013, plain film 10/20/2013  FINDINGS: Chest:  Unremarkable soft tissues of the chest wall.  Small axillary lymph nodes with no supraclavicular adenopathy.  Unremarkable appearance of the thyroid and thoracic inlet.  Central airways are patent.  Small hiatal hernia.  No mediastinal adenopathy. Lymph node measures 9-10 mm in the left hilum.  Global cardiomegaly.  Trace pericardial fluid/ thickening.  Calcifications of the aortic arch and  descending thoracic aorta. No aneurysm or dissection flap. No periaortic fluid.  Separate origin of the left vertebral artery. Otherwise, branch vessels are patent with no significant atherosclerotic disease at the origin of the branch vessels. Calcifications in the region of the circumflex coronary artery. Calcifications in the left anterior descending coronary artery.  No central, lobar, segmental, or subsegmental filling defects. Main pulmonary artery measures 2.7 cm.  Paraseptal and centrilobular emphysema. There are a few partially calcified nodules which are compatible with granulomatous disease. Minimal atelectasis at the bases of the lungs. Trace left-sided pleural fluid.  Compared to the prior CT of 7 04/08/2013, there has been resolution in airspace disease.  Upper abdomen:  Unremarkable appearance of the upper abdomen.  Review of the MIP images confirms the above findings.  IMPRESSION: Study is negative for pulmonary emboli.  Resolution of previous airspace disease, with trace left pleural effusion.  Changes of paraseptal and centrilobular emphysema.  Atherosclerosis with 2 vessel coronary artery disease  Signed,  Dulcy Fanny. Earleen Newport, DO  [facility]  [facility] Radiology   Electronically Signed   By: Corrie Mckusick O.D.   On: 10/20/2013 20:44   The patient was turned over from the day shift physician and resident. The plan was to check second troponin which was fine. It as ordered d-dimer which was elevated so therefore patient had CT angiogram is not consistent with a pulmonary embolus. Patient arrived with left-sided chest pain intermittent over the last week pain worse with movement and deep inspiration. No distress noted denied any other symptoms.   Fredia Sorrow, MD 10/20/13 2210  Fredia Sorrow, MD 10/20/13 2213

## 2013-10-20 NOTE — ED Notes (Signed)
Attempted to draw blood from pt x 2. Phlebotomy notified to come draw blood.

## 2013-10-20 NOTE — Discharge Instructions (Signed)

## 2013-10-20 NOTE — ED Provider Notes (Signed)
Care assumed from Dr. Kathi Simpers, see his note for previous care and full HPI.  On my arrival, patient HDS. GCS 15. Well appearing, states pain has eased off. Cards has seen, recommends delta trop, d-dimer and outpatient workup if workup here neg. Delta trop neg. D-dimer elevated. Went and evaluated patient, and explained need for CTA. She was understanding. CTA ordered. Neg for PE. Pt HDS, well appearing, NAD. Stable for d/c with cards outpt f/u.    Sol Passer, MD 10/20/13 [phone]

## 2013-10-20 NOTE — Telephone Encounter (Signed)
Pt calling to inform Dr Meda Coffee that she has been experiencing LEE, mild sob, DOE, and chest tightness for one week now.  Pt states she went to her PCP on Monday about this issue and PCP prescribed her Lasix for the LEE.  Pt states that PCP also did blood work and obtained a urine specimen.  Pt unaware of blood work results, but she stated her urine came back clear and WNL.  Pt unaware what type blood work done.  Pt states that her chest tightness is midsternal in location and intermittently radiates to the left side of her chest.  Pt states she has had a 5 lb weight gain within the past week. Pts current wt today is 129 lbs, as she weighs herself daily. Pt also complains of feeling light headed at times.  Pt states she continues to smoke cigarettes daily, but has limited this to 3 a day.  Pt states she feels like her health is declining.  Pt requesting to see Dr Meda Coffee today for complaints.  Pt states given her health history, she is concerned for her well being.  Informed the pt that Dr Meda Coffee is out of the office today, but I will route this message to her for further review and recommendation.  Informed the pt that I will try and schedule her for a PA visit today if open.  Advised pt while I'm working on obtaining her an appt she should contact her PCP and have them fax recent bloodwork to our office now at [phone].  Pt verbalized understanding and agrees with this plan.

## 2013-10-20 NOTE — Telephone Encounter (Signed)
Melina Copa PA notified triage to cancel pts appt for today at 2pm and instruct her to go directly to [facility] ED for a full workup.  Melina Copa PA states that the pt could have multiple systems that are being affected at this time, given her complaints and health history.  Pt notified and instructed to go there now.  Trish notified as well.  Pt verbalized understanding and agrees with this plan.

## 2013-10-20 NOTE — ED Notes (Signed)
Pt in c/o left sided chest pain that has been intermittent over the last week, pain is worse with movement or deep inspiration, no distress noted, denies other symptoms

## 2013-10-20 NOTE — Consult Note (Signed)
Reason for Consult: Chest Pain Referring Physician: [facility] ED   HPI: The patient is a 73 y/o female, formerly followed by Dr. Verl Blalock and now by Dr. Meda Coffee. She has a PMH significant for CAD s/p LHC in 12/2012 resulting in DES to the mid left circumflex and DES to the OM1. She was noted to have moderate residual disease with 30-40% mid LAD stenosis and 50% in the first diag as well as 50% mid RCA stenosis. LV function was noted to be normal at 55-60%. She also has treated HTN, HLD and is a smoker. She had a a SBO in April of this year and required partial colon resection and now has a colostomy bag.   She presents to the [facility] ER today with complaint of left sided chest pain.    Past Medical History  Diagnosis Date  . Carotid artery disease     a. 60-70% bilat ICA stenosis by dopplers 05/2012.  Marland Kitchen Other and unspecified hyperlipidemia   . CAD (coronary artery disease)     a. Mod dz 2010 initially mgd medically. b. 12/2012 - angina s/p PTCA/DES to mid-circumflex, PTCA/DES to first OM.   Marland Kitchen Hypertension   . Depression   . Barrett's esophagus   . SBO (small bowel obstruction)     a. Lysis of adhesions and ovarian cystectomy 04/2013 for sBO. b. Admitted 07/2013 for colonic obstruction due to suspected colitis, s/p partial colectomy/colostomy 08/01/13. Admission complicated by anasarca, acute resp failure requiring tracheostomy, decannulated 08/25/13. c. Recurrent SBO8/2015, NGT placed for decompression.   . Colonic obstruction due to suspected colitis; s/p colectomy/colostomy     a. See SBO.  Marland Kitchen Protein-calorie malnutrition, severe w/ electrolyte imbalance     a. Severe hypoalbuminemia leading to 3rd spacing including anasarca and pulm edema 07/2013.  Marland Kitchen HTN (hypertension)   . DVT of axillary vein, acute right     a. Dx 07/2013 felt due to R IJ central line that had been inserted 7/14. Not on anticoag due to GIB issues and small cerebral bleed.  . SBO (small bowel obstruction)   . Acute respiratory failure with  hypoxia s/p tracheostomy     a. 07/2013 during admission for colonic obstruction.  . Intracranial hemorrhage     a. 08/11/13 - per DC summary, tiny SAH vs SDH done for altered mentation, anticoag stopped including aspirin.  . Anemia     a. 7-08/2013 felt due to recent critical illness/chronic disease.    Past Surgical History  Procedure Laterality Date  . Appendectomy    . Cholecystectomy    . Colon resection    . 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 Rotator cuff repair Left   . Abdominal hysterectomy    . Colon surgery    . Foot surgery    . Hand surgery    . Laparotomy N/A 08/01/2013    Procedure: EXPLORATORY LAPAROTOMY;  Surgeon: Harl Bowie, MD;  Location: [place];  Service: General;  Laterality: N/A;  . Partial colectomy N/A 08/01/2013    Procedure: PARTIAL COLECTOMY;  Surgeon: Harl Bowie, MD;  Location: [place];  Service: General;  Laterality: N/A;  . Colostomy N/A 08/01/2013    Procedure: COLOSTOMY;  Surgeon: Harl Bowie, MD;  Location: [place];  Service: General;  Laterality: N/A;  . Bowel resection N/A 08/01/2013    Procedure: SMALL BOWEL RESECTION;  Surgeon: Harl Bowie,  MD;  Location: [facility];  Service: General;  Laterality: N/A;  . Tracheostomy      feinstein    Family History  Problem Relation Age of Onset  . Heart disease      Social History:  reports that she has been smoking Cigarettes.  She has been smoking about 0.00 packs per day. She has never used smokeless tobacco. She reports that she does not drink alcohol or use illicit drugs.  Allergies:  Allergies  Allergen Reactions  . Pregabalin Swelling    Tongue swelling  . Sulfonamide Derivatives Swelling    Childhood reaction    Medications: I have reviewed the patient's current medications.  Results for orders placed during the hospital encounter of 10/20/13 (from the past 48 hour(s))  CBC     Status: Abnormal   Collection Time     10/20/13  1:48 PM      Result Value Ref Range   WBC 6.3  4.0 - 10.5 K/uL   RBC 3.43 (*) 3.87 - 5.11 MIL/uL   Hemoglobin 9.3 (*) 12.0 - 15.0 g/dL   HCT 30.1 (*) 36.0 - 46.0 %   MCV 87.8  78.0 - 100.0 fL   MCH 27.1  26.0 - 34.0 pg   MCHC 30.9  30.0 - 36.0 g/dL   RDW 16.8 (*) 11.5 - 15.5 %   Platelets 384  150 - 400 K/uL  BASIC METABOLIC PANEL     Status: Abnormal   Collection Time    10/20/13  1:48 PM      Result Value Ref Range   Sodium 147  137 - 147 mEq/L   Potassium 3.7  3.7 - 5.3 mEq/L   Chloride 112  96 - 112 mEq/L   CO2 24  19 - 32 mEq/L   Glucose, Bld 122 (*) 70 - 99 mg/dL   BUN 12  6 - 23 mg/dL   Creatinine, Ser 0.60  0.50 - 1.10 mg/dL   Calcium 8.5  8.4 - 10.5 mg/dL   GFR calc non Af Amer 88 (*) >90 mL/min   GFR calc Af Amer >90  >90 mL/min   Comment: (NOTE)     The eGFR has been calculated using the CKD EPI equation.     This calculation has not been validated in all clinical situations.     eGFR's persistently <90 mL/min signify possible Chronic Kidney     Disease.   Anion gap 11  5 - 15  I-STAT TROPOININ, ED     Status: None   Collection Time    10/20/13  2:04 PM      Result Value Ref Range   Troponin i, poc 0.01  0.00 - 0.08 ng/mL   Comment 3            Comment: Due to the release kinetics of cTnI,     a negative result within the first hours     of the onset of symptoms does not rule out     myocardial infarction with certainty.     If myocardial infarction is still suspected,     repeat the test at appropriate intervals.    Dg Chest 2 View  10/20/2013   CLINICAL DATA:  73 year old female with chest pain, left-sided.  EXAM: CHEST - 2 VIEW  COMPARISON:  Plain film 09/16/2013, CT abdomen 09/16/2013. Chest CT 08/11/2013  FINDINGS: Cardiomediastinal silhouette unchanged in size and contour. Atherosclerotic calcifications of the aortic arch. Coronary stents visible.  No evidence of  pulmonary vascular congestion.  No confluent airspace disease.  No pneumothorax.   Pleural parenchymal scarring at the apex. Changes of emphysema including flattening of the hemidiaphragms and increased AP diameter.  No acute bony abnormality.  Surgical changes of cholecystectomy.  IMPRESSION: No radiographic evidence of acute cardiopulmonary disease.  Atherosclerosis and coronary artery disease.  Emphysema.  Signed,  Dulcy Fanny. Earleen Newport, DO  [facility]  [facility]   Electronically Signed   By: Corrie Mckusick O.D.   On: 10/20/2013 15:52    Review of Systems  Eyes: Negative for photophobia.   Blood pressure 139/55, pulse 98, temperature 98.7 F (37.1 C), temperature source Oral, resp. rate 25, SpO2 100.00%. Physical Exam General: NAD Neck: No JVD, no thyromegaly or thyroid nodule.  Lungs: Clear to auscultation bilaterally with normal respiratory effort. CV: Nondisplaced PMI.  Heart regular S1/S2, no S3/S4, 2/6 early SEM.  No peripheral edema.  No carotid bruit.  Normal pedal pulses.  Abdomen: Soft, nontender, no hepatosplenomegaly, no distention.  Skin: Intact without lesions or rashes.  Neurologic: Alert and oriented x 3.  Psych: Normal affect. Extremities: No clubbing or cyanosis.  HEENT: Normal.  MSK: Tender to palpation left lower chest/left mid-flank.    Assessment/Plan: See attending note.   SIMMONS, BRITTAINY 10/20/2013, 4:01 PM   Patient seen with PA, agree with the above note.  Patient has had left lower chest/left flank/left mid back discomfort that has been coming and going since a fall about a week ago.  It is somewhat pleuritic.  It is not exertional.  She fell on her left side.  She is tender to palpation left lower chest/left mid-flank.  ECG and troponin negative.  I will have her get a D dimer and a repeat TnI in a couple of hours.  If both are normal, she can go home with followup in our office for a Cardiolite.   Loralie Champagne 10/20/2013 4:14 PM

## 2013-10-20 NOTE — Progress Notes (Signed)
  This encounter was created in error - please disregard. Canceled due to referral to ED.

## 2013-10-20 NOTE — ED Provider Notes (Signed)
CSN: [id number]     Arrival date & time 10/20/13  1339 History   First MD Initiated Contact with Patient 10/20/13 1422     Chief Complaint  Patient presents with  . Chest Pain   Patient is a 73 y.o. female presenting with chest pain. The history is provided by the patient and medical records.  Chest Pain Pain location:  Substernal area Pain quality: pressure   Radiates to: L scapula. Pain radiates to the back: yes   Pain severity:  Moderate Onset quality:  Gradual Duration:  1 week Timing:  Intermittent Progression:  Waxing and waning Chronicity:  Recurrent Context: at rest   Relieved by:  Nothing Associated symptoms: lower extremity edema   Associated symptoms: no abdominal pain, no back pain, no cough, no dizziness, no fever, no headache, no nausea, no orthopnea, no palpitations, no shortness of breath and not vomiting   Risk factors: coronary artery disease and hypertension    Is a 73 year old Caucasian female with history of urinary disease status post stent placement who presents with chest pain and lower extremity edema. Patient states that for the last 2 weeks she has noticed increased pain and swelling in her feet and ankles. Patient states that for the last week she has had intermittent chest pressure that radiates to her left scapula. She denies shortness of breath or cough. Pt saw PCP today and was prescribed lasix for LE edema.  Past Medical History  Diagnosis Date  . Carotid artery disease     a. 60-70% bilat ICA stenosis by dopplers 05/2012.  Marland Kitchen Other and unspecified hyperlipidemia   . CAD (coronary artery disease)     a. Mod dz 2010 initially mgd medically. b. 12/2012 - angina s/p PTCA/DES to mid-circumflex, PTCA/DES to first OM.   Marland Kitchen Hypertension   . Depression   . Barrett's esophagus   . SBO (small bowel obstruction)     a. Lysis of adhesions and ovarian cystectomy 04/2013 for sBO. b. Admitted 07/2013 for colonic obstruction due to suspected colitis, s/p partial  colectomy/colostomy 08/01/13. Admission complicated by anasarca, acute resp failure requiring tracheostomy, decannulated 08/25/13. c. Recurrent SBO8/2015, NGT placed for decompression.   . Colonic obstruction due to suspected colitis; s/p colectomy/colostomy     a. See SBO.  Marland Kitchen Protein-calorie malnutrition, severe w/ electrolyte imbalance     a. Severe hypoalbuminemia leading to 3rd spacing including anasarca and pulm edema 07/2013.  Marland Kitchen HTN (hypertension)   . DVT of axillary vein, acute right     a. Dx 07/2013 felt due to R IJ central line that had been inserted 7/14. Not on anticoag due to GIB issues and small cerebral bleed.  . SBO (small bowel obstruction)   . Acute respiratory failure with hypoxia s/p tracheostomy     a. 07/2013 during admission for colonic obstruction.  . Intracranial hemorrhage     a. 08/11/13 - per DC summary, tiny SAH vs SDH done for altered mentation, anticoag stopped including aspirin.  . Anemia     a. 7-08/2013 felt due to recent critical illness/chronic disease.   Past Surgical History  Procedure Laterality Date  . Appendectomy    . Cholecystectomy    . Colon resection    . 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 Rotator cuff repair Left   . Abdominal hysterectomy    . Colon surgery    . Foot surgery    .  Hand surgery    . Laparotomy N/A 08/01/2013    Procedure: EXPLORATORY LAPAROTOMY;  Surgeon: Harl Bowie, MD;  Location: [place];  Service: General;  Laterality: N/A;  . Partial colectomy N/A 08/01/2013    Procedure: PARTIAL COLECTOMY;  Surgeon: Harl Bowie, MD;  Location: [place];  Service: General;  Laterality: N/A;  . Colostomy N/A 08/01/2013    Procedure: COLOSTOMY;  Surgeon: Harl Bowie, MD;  Location: [place];  Service: General;  Laterality: N/A;  . Bowel resection N/A 08/01/2013    Procedure: SMALL BOWEL RESECTION;  Surgeon: Harl Bowie, MD;  Location: [place];  Service: General;   Laterality: N/A;  . Tracheostomy      feinstein   Family History  Problem Relation Age of Onset  . Heart disease     History  Substance Use Topics  . Smoking status: Light Tobacco Smoker    Types: Cigarettes  . Smokeless tobacco: Never Used  . Alcohol Use: No   OB History   Grav Para Term Preterm Abortions TAB SAB Ect Mult Living                 Review of Systems  Constitutional: Negative for fever.  HENT: Negative for rhinorrhea and sore throat.   Eyes: Negative for visual disturbance.  Respiratory: Negative for cough, chest tightness and shortness of breath.   Cardiovascular: Positive for chest pain and leg swelling. Negative for palpitations and orthopnea.  Gastrointestinal: Negative for nausea, vomiting, abdominal pain and constipation.  Genitourinary: Negative for dysuria and hematuria.  Musculoskeletal: Negative for back pain and neck pain.  Skin: Negative for rash.  Neurological: Negative for dizziness and headaches.  Psychiatric/Behavioral: Negative for confusion.  All other systems reviewed and are negative.  Allergies  Pregabalin and Sulfonamide derivatives  Home Medications   Prior to Admission medications   Medication Sig Start Date End Date Taking? Authorizing Provider  acetaminophen (TYLENOL) 325 MG tablet Take 2 tablets (650 mg total) by mouth every 6 (six) hours as needed for mild pain, fever or headache. 08/26/13   Samella Parr, NP  atorvastatin (LIPITOR) 80 MG tablet Take 80 mg by mouth every evening.     Historical Provider, MD  clopidogrel (PLAVIX) 75 MG tablet Take 1 tablet (75 mg total) by mouth daily. 10/12/13   Dorothy Spark, MD  DULoxetine (CYMBALTA) 60 MG capsule Take 60 mg by mouth daily. 09/14/13   Historical Provider, MD  feeding supplement, ENSURE COMPLETE, (ENSURE COMPLETE) LIQD Take 237 mLs by mouth 2 (two) times daily between meals. 08/26/13   Samella Parr, NP  fish oil-omega-3 fatty acids 1000 MG capsule Take 1,000 mg by mouth daily.      Historical Provider, MD  folic acid (FOLVITE) 161 MCG tablet Take 800 mcg by mouth daily.     Historical Provider, MD  ipratropium-albuterol (DUONEB) 0.5-2.5 (3) MG/3ML SOLN Take 3 mLs by nebulization every 6 (six) hours as needed (for shortness of breath).    Historical Provider, MD  lip balm (BLISTEX) OINT Apply 1 application topically 2 (two) times daily. 08/26/13   Samella Parr, NP  metoprolol tartrate (LOPRESSOR) 12.5 mg TABS tablet Take 0.5 tablets (12.5 mg total) by mouth 2 (two) times daily. 09/19/13   Shanker Kristeen Mans, MD  nitroGLYCERIN (NITROSTAT) 0.4 MG SL tablet Place 1 tablet (0.4 mg total) under the tongue every 5 (five) minutes as needed for chest pain. 12/13/12   Dorothy Spark, MD  ondansetron [facility])  4 MG tablet Take 4 mg by mouth every 8 (eight) hours as needed for nausea or vomiting.    Historical Provider, MD  oxyCODONE-acetaminophen (PERCOCET/ROXICET) 5-325 MG per tablet Take 1-2 tablets by mouth every 4 (four) hours as needed for moderate pain.  09/14/13   Historical Provider, MD  pantoprazole (PROTONIX) 40 MG tablet TAKE 1 TABLET TWICE DAILY 10/10/13   Dorothy Spark, MD  zaleplon (SONATA) 10 MG capsule Take 10 mg by mouth at bedtime. 09/14/13   Historical Provider, MD   BP 139/55  Pulse 98  Temp(Src) 98.7 F (37.1 C) (Oral)  Resp 25  SpO2 100% Physical Exam  Constitutional: She is oriented to person, place, and time. She appears well-developed and well-nourished. No distress.  HENT:  Head: Normocephalic and atraumatic.  Mouth/Throat: Oropharynx is clear and moist.  Eyes: EOM are normal. Pupils are equal, round, and reactive to light.  Neck: Neck supple. No JVD present.  Cardiovascular: Normal rate, regular rhythm, normal heart sounds and intact distal pulses.  Exam reveals no gallop.   No murmur heard. Pulmonary/Chest: Effort normal and breath sounds normal. She has no decreased breath sounds. She has no wheezes. She has no rales.  Abdominal: Soft. She  exhibits no distension. There is no tenderness.  Midline surgical incision with packing, no surrounding erythema or purulence. Dressing clean/dry/intact. Loose brown stool in ostomy bag.  Musculoskeletal: Normal range of motion. She exhibits edema (2+ pitting edema in ankles). She exhibits no tenderness.  Neurological: She is alert and oriented to person, place, and time. No cranial nerve deficit. She exhibits normal muscle tone.  Skin: Skin is warm and dry. No rash noted.  Psychiatric: Her behavior is normal.    ED Course  Procedures None   Labs Review Labs Reviewed  CBC - Abnormal; Notable for the following:    RBC 3.43 (*)    Hemoglobin 9.3 (*)    HCT 30.1 (*)    RDW 16.8 (*)    All other components within normal limits  BASIC METABOLIC PANEL - Abnormal; Notable for the following:    Glucose, Bld 122 (*)    GFR calc non Af Amer 88 (*)    All other components within normal limits  PRO B NATRIURETIC PEPTIDE  HEPATIC FUNCTION PANEL  TROPONIN I  D-DIMER, QUANTITATIVE  I-STAT TROPOININ, ED    Imaging Review Dg Chest 2 View  10/20/2013   CLINICAL DATA:  73 year old female with chest pain, left-sided.  EXAM: CHEST - 2 VIEW  COMPARISON:  Plain film 09/16/2013, CT abdomen 09/16/2013. Chest CT 08/11/2013  FINDINGS: Cardiomediastinal silhouette unchanged in size and contour. Atherosclerotic calcifications of the aortic arch. Coronary stents visible.  No evidence of pulmonary vascular congestion.  No confluent airspace disease.  No pneumothorax.  Pleural parenchymal scarring at the apex. Changes of emphysema including flattening of the hemidiaphragms and increased AP diameter.  No acute bony abnormality.  Surgical changes of cholecystectomy.  IMPRESSION: No radiographic evidence of acute cardiopulmonary disease.  Atherosclerosis and coronary artery disease.  Emphysema.  Signed,  Dulcy Fanny. Earleen Newport, DO  [facility]  [facility]   Electronically  Signed   By: Corrie Mckusick O.D.   On: 10/20/2013 15:52    EKG Interpretation  Date/Time:  Thursday October 20 2013 13:45:53 EDT Ventricular Rate:  103 PR Interval:  168 QRS Duration: 70 QT Interval:  356 QTC Calculation: 466 R Axis:   74 Text Interpretation:  Sinus tachycardia Otherwise normal ECG No  significant change since last tracing Confirmed by HARRISON  MD, FORREST  [phone]) on 10/20/2013 2:47:59 PM    MDM   Final diagnoses:  Chest pain, unspecified chest pain type  Bilateral lower extremity edema   73 year old Caucasian female with a history of coronary disease status post stents on Plavix who presents with chest pressure for one week. Patient is well appearing on exam but continues to complain of chest pressure. She is afebrile normotensive vitals are stable. She has bilateral pedal edema but no edema of her lower legs or knees. Lungs are clear to auscultation bilaterally. She has a colostomy bag with midline surgical incision that is packed with no surrounding erythema or purulence. Abdomen is soft and non-peritonitic. Troponin is negative. EKG with sinus tach, no ischemic changes. Hemoglobin is 9.3. Will obtain LFTs and BNP. Cardiology consulted given the patient's history and continued symptoms. ASA 325 mg given. Cardiology evaluated patient. The recommend repeating troponin and obtaining D-dimer as they feel patient's pain is pleuritic. If these are negative, patient can be discharged home with cards f/u and outpatient cath.  Care assumed by Dr. Sunny Schlein at approx 1600.  Please see his note for remainder of ED management and disposition.   Case discussed with Dr. Aline Brochure.  Gustavus Bryant, MD  Gustavus Bryant, MD 10/20/13 [phone]

## 2013-10-21 ENCOUNTER — Encounter: Payer: Self-pay | Admitting: Cardiology

## 2013-10-24 ENCOUNTER — Encounter (INDEPENDENT_AMBULATORY_CARE_PROVIDER_SITE_OTHER): Payer: Commercial Managed Care - HMO | Admitting: Surgery

## 2013-10-24 ENCOUNTER — Encounter: Payer: Self-pay | Admitting: Cardiology

## 2013-11-03 ENCOUNTER — Encounter (HOSPITAL_COMMUNITY): Payer: Commercial Managed Care - HMO

## 2013-11-04 ENCOUNTER — Ambulatory Visit (HOSPITAL_COMMUNITY): Payer: Medicare HMO | Attending: Cardiology | Admitting: Radiology

## 2013-11-04 VITALS — BP 158/66 | HR 81 | Ht 60.0 in | Wt 132.0 lb

## 2013-11-04 DIAGNOSIS — I2510 Atherosclerotic heart disease of native coronary artery without angina pectoris: Secondary | ICD-10-CM | POA: Insufficient documentation

## 2013-11-04 DIAGNOSIS — R42 Dizziness and giddiness: Secondary | ICD-10-CM | POA: Insufficient documentation

## 2013-11-04 DIAGNOSIS — R079 Chest pain, unspecified: Secondary | ICD-10-CM | POA: Diagnosis not present

## 2013-11-04 DIAGNOSIS — I10 Essential (primary) hypertension: Secondary | ICD-10-CM | POA: Insufficient documentation

## 2013-11-04 DIAGNOSIS — R0789 Other chest pain: Secondary | ICD-10-CM

## 2013-11-04 DIAGNOSIS — J449 Chronic obstructive pulmonary disease, unspecified: Secondary | ICD-10-CM | POA: Diagnosis not present

## 2013-11-04 MED ORDER — AMINOPHYLLINE 25 MG/ML IV SOLN
150.0000 mg | Freq: Once | INTRAVENOUS | Status: AC
  Administered 2013-11-04: 150 mg via INTRAVENOUS

## 2013-11-04 MED ORDER — TECHNETIUM TC 99M SESTAMIBI GENERIC - CARDIOLITE
10.0000 | Freq: Once | INTRAVENOUS | Status: AC | PRN
  Administered 2013-11-04: 10 via INTRAVENOUS

## 2013-11-04 MED ORDER — TECHNETIUM TC 99M SESTAMIBI GENERIC - CARDIOLITE
30.0000 | Freq: Once | INTRAVENOUS | Status: AC | PRN
  Administered 2013-11-04: 30 via INTRAVENOUS

## 2013-11-04 MED ORDER — REGADENOSON 0.4 MG/5ML IV SOLN
0.4000 mg | Freq: Once | INTRAVENOUS | Status: AC
  Administered 2013-11-04: 0.4 mg via INTRAVENOUS

## 2013-11-04 NOTE — Progress Notes (Signed)
[facility] [address] [place], [place] [address] [phone]    Cardiology Nuclear Med Study  [name] is a 73 y.o. female     MRN : [id number]     DOB: 1940-07-23  Procedure Date: 11/04/2013  Nuclear Med Background Indication for Stress Test:  Evaluation for Ischemia and Follow up CAD, [facility]- CP History:  CAD, MPI 2009 EF 79%, COPD Cardiac Risk Factors: Carotid Disease and Hypertension  Symptoms:  Chest Pain and Light-Headedness   Nuclear Pre-Procedure Caffeine/Decaff Intake:  None NPO After: 6:30pm   Lungs:  clear O2 Sat: 98% on room air. IV 0.9% NS with Angio Cath:  22g  IV Site: R Hand  IV Started by:  Crissie Figures, RN  Chest Size (in):  36 Cup Size: B  Height: 5' (1.524 m)  Weight:  132 lb (59.875 kg)  BMI:  Body mass index is 25.78 kg/(m^2). Tech Comments:  N/A    Nuclear Med Study 1 or 2 day study: 1 day  Stress Test Type:  Lexiscan  Reading MD: N/A  Order Authorizing Provider:  Ena Dawley, MD  Resting Radionuclide: Technetium 83m Sestamibi  Resting Radionuclide Dose: 11.0 mCi   Stress Radionuclide:  Technetium 41m Sestamibi  Stress Radionuclide Dose: 33.0 mCi           Stress Protocol Rest HR: 81 Stress HR: 112  Rest BP: 158/66 Stress BP: 143/50  Exercise Time (min): n/a METS: n/a           Dose of Adenosine (mg):  n/a Dose of Lexiscan: 0.4 mg  Dose of Atropine (mg): n/a Dose of Dobutamine: n/a mcg/kg/min (at max HR)  Stress Test Technologist: Glade Lloyd, BS-ES  Nuclear Technologist:  Earl Many, CNMT     Rest Procedure:  Myocardial perfusion imaging was performed at rest 45 minutes following the intravenous administration of Technetium 6m Sestamibi. Rest ECG: Normal sinus rhythm. Normal EKG  Stress Procedure:  The patient received IV Lexiscan 0.4 mg over 15-seconds.  Technetium 70m Sestamibi injected at 30-seconds.  Quantitative spect images were obtained after a 45 minute delay.   During the infusion of Lexiscan the patient had a cough and complained of SOB, lightheadedness, chest tightness, headache and legs aching.  Symptoms began to resolve but legs and buttocks continued to cause discomfort so 150 mg aminophylline was administered.  The patient began to feel better almost immediately.  Stress ECG: No significant change from baseline ECG  QPS Raw Data Images:  Normal; no motion artifact; normal heart/lung ratio. Stress Images:  Normal homogeneous uptake in all areas of the myocardium. Rest Images:  Normal homogeneous uptake in all areas of the myocardium. Subtraction (SDS):  No evidence of ischemia. Transient Ischemic Dilatation (Normal <1.22):  1.16 Lung/Heart Ratio (Normal <0.45):  0.32  Quantitative Gated Spect Images QGS EDV:  70 ml QGS ESV:  22 ml  Impression Exercise Capacity:  Lexiscan with no exercise. BP Response:  Normal blood pressure response. Clinical Symptoms:  Chest tightness ECG Impression:  No significant ST segment change suggestive of ischemia. Comparison with Prior Nuclear Study: Study is compared with the report of the study from October, 2009  Overall Impression:  Normal stress nuclear study. No scar or ischemia. Low risk scan. No change when compared to the report of October, 2009  LV Ejection Fraction: 69%.  LV Wall Motion:  Normal Wall Motion.  Dola Argyle, MD

## 2013-11-08 NOTE — Telephone Encounter (Signed)
Noted pt was contacted by triage nurse on 10/20 at 10:37 am.

## 2013-11-08 NOTE — Telephone Encounter (Signed)
Follow up    Patient returning call back to stating someone called her.

## 2013-11-10 ENCOUNTER — Ambulatory Visit (INDEPENDENT_AMBULATORY_CARE_PROVIDER_SITE_OTHER): Payer: Commercial Managed Care - HMO | Admitting: Cardiology

## 2013-11-10 ENCOUNTER — Encounter: Payer: Self-pay | Admitting: Cardiology

## 2013-11-10 VITALS — BP 120/60 | HR 99 | Ht 60.0 in | Wt 134.0 lb

## 2013-11-10 DIAGNOSIS — I5033 Acute on chronic diastolic (congestive) heart failure: Secondary | ICD-10-CM | POA: Insufficient documentation

## 2013-11-10 DIAGNOSIS — I779 Disorder of arteries and arterioles, unspecified: Secondary | ICD-10-CM

## 2013-11-10 DIAGNOSIS — I808 Phlebitis and thrombophlebitis of other sites: Secondary | ICD-10-CM

## 2013-11-10 DIAGNOSIS — I739 Peripheral vascular disease, unspecified: Principal | ICD-10-CM

## 2013-11-10 DIAGNOSIS — I82A11 Acute embolism and thrombosis of right axillary vein: Secondary | ICD-10-CM

## 2013-11-10 DIAGNOSIS — I5032 Chronic diastolic (congestive) heart failure: Secondary | ICD-10-CM | POA: Insufficient documentation

## 2013-11-10 DIAGNOSIS — E876 Hypokalemia: Secondary | ICD-10-CM

## 2013-11-10 DIAGNOSIS — E782 Mixed hyperlipidemia: Secondary | ICD-10-CM

## 2013-11-10 MED ORDER — FUROSEMIDE 40 MG PO TABS: 40.0000 mg | ORAL_TABLET | Freq: Every day | ORAL | Status: DC

## 2013-11-10 NOTE — Patient Instructions (Signed)
Your physician has recommended you make the following change in your medication:   INCREASE YOUR LASIX TO 40 MG DAILY  PER DR NELSON YOU ARE TO STOP TAKING PLAVIX ON 12/20/13.      DR Meda Coffee HAS ORDERED FOR YOU TO WEAR COMPRESSION STOCKINGS DAILY     Your physician recommends that you return for lab work in: [place] ([facility])   Your physician recommends that you schedule a follow-up appointment in: [place]

## 2013-11-10 NOTE — Progress Notes (Signed)
Patient ID: [name], female   DOB: Apr 25, 1940, 73 y.o.   MRN: [id number]    Patient Name: [name] Date of Encounter: 11/10/2013  Primary Care Provider:  Laverna Peace, NP Primary Cardiologist:  Dorothy Spark  Problem List   Past Medical History  Diagnosis Date  . Carotid artery disease     a. 60-70% bilat ICA stenosis by dopplers 05/2012.  Marland Kitchen Other and unspecified hyperlipidemia   . CAD (coronary artery disease)     a. Mod dz 2010 initially mgd medically. b. 12/2012 - angina s/p PTCA/DES to mid-circumflex, PTCA/DES to first OM.   Marland Kitchen Hypertension   . Depression   . Barrett's esophagus   . SBO (small bowel obstruction)     a. Lysis of adhesions and ovarian cystectomy 04/2013 for sBO. b. Admitted 07/2013 for colonic obstruction due to suspected colitis, s/p partial colectomy/colostomy 08/01/13. Admission complicated by anasarca, acute resp failure requiring tracheostomy, decannulated 08/25/13. c. Recurrent SBO8/2015, NGT placed for decompression.   . Colonic obstruction due to suspected colitis; s/p colectomy/colostomy     a. See SBO.  Marland Kitchen Protein-calorie malnutrition, severe w/ electrolyte imbalance     a. Severe hypoalbuminemia leading to 3rd spacing including anasarca and pulm edema 07/2013.  Marland Kitchen HTN (hypertension)   . DVT of axillary vein, acute right     a. Dx 07/2013 felt due to R IJ central line that had been inserted 7/14. Not on anticoag due to GIB issues and small cerebral bleed.  . SBO (small bowel obstruction)   . Acute respiratory failure with hypoxia s/p tracheostomy     a. 07/2013 during admission for colonic obstruction.  . Intracranial hemorrhage     a. 08/11/13 - per DC summary, tiny SAH vs SDH done for altered mentation, anticoag stopped including aspirin.  . Anemia     a. 7-08/2013 felt due to recent critical illness/chronic disease.   Past Surgical History  Procedure Laterality Date  . Appendectomy    . Cholecystectomy    . Colon resection    .  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 Rotator cuff repair Left   . Abdominal hysterectomy    . Colon surgery    . Foot surgery    . Hand surgery    . Laparotomy N/A 08/01/2013    Procedure: EXPLORATORY LAPAROTOMY;  Surgeon: Harl Bowie, MD;  Location: [place];  Service: General;  Laterality: N/A;  . Partial colectomy N/A 08/01/2013    Procedure: PARTIAL COLECTOMY;  Surgeon: Harl Bowie, MD;  Location: [place];  Service: General;  Laterality: N/A;  . Colostomy N/A 08/01/2013    Procedure: COLOSTOMY;  Surgeon: Harl Bowie, MD;  Location: [place];  Service: General;  Laterality: N/A;  . Bowel resection N/A 08/01/2013    Procedure: SMALL BOWEL RESECTION;  Surgeon: Harl Bowie, MD;  Location: [place];  Service: General;  Laterality: N/A;  . Tracheostomy      feinstein    Allergies  Allergies  Allergen Reactions  . Pregabalin Swelling    Tongue swelling  . Sulfonamide Derivatives Swelling    Childhood reaction   HPI  [name] was previously followed by Dr Verl Blalock. She is a pleasant 73 year old female with h/o hyperlipidemia, hypertension, PAD and CAD who returns today for a 1 year follow up. The patient is a lifelong smoker.  She had  worsening exertional CP and DOE in November 2014, underwent a cardiac cath on 12/20/2012 with finding of severe stenosis in OM1 and received 2 DES. She was doing well from cardiac standpoint. In May 2015 had a SBO and required partial colon resection and now has a colostomy bag, lost 30 lbs.   She has been experiencing LE edema and SOB since then, got worse 3 weeks ago. She was seen in the ER and scheduled for an outpatient Lexiscan stress that was normal. She was prescribed 20 mg PO Lasix that helped mildly. LE edema worse toward the end of day. No orthopnea, PND. BNP 300, CXR no congestion. She is complaining of significant bruising.   Home Medications  Prior to Admission medications    Medication Sig Start Date End Date Taking? Authorizing Provider  aspirin 81 MG tablet Take 81 mg by mouth 2 (two) times daily.    Yes Historical Provider, MD  atorvastatin (LIPITOR) 80 MG tablet Take 80 mg by mouth daily.   Yes Historical Provider, MD  dicyclomine (BENTYL) 10 MG capsule as directed. 08/20/10  Yes Historical Provider, MD  diphenhydramine-acetaminophen (TYLENOL PM) 25-500 MG TABS Take 1 tablet by mouth at bedtime as needed.     Yes Historical Provider, MD  fish oil-omega-3 fatty acids 1000 MG capsule Take 1 g by mouth daily.     Yes Historical Provider, MD  folic acid (FOLVITE) 841 MCG tablet Take 400 mcg by mouth daily.     Yes Historical Provider, MD  metoprolol succinate (TOPROL-XL) 25 MG 24 hr tablet TAKE 1 TABLET ONCE DAILY. 05/04/12  Yes Renella Cunas, MD  nitroGLYCERIN (NITROSTAT) 0.4 MG SL tablet Place 0.4 mg under the tongue every 5 (five) minutes as needed.     Yes Historical Provider, MD  omeprazole (PRILOSEC) 20 MG capsule Take 20 mg by mouth 2 (two) times daily.     Yes Historical Provider, MD  sertraline (ZOLOFT) 100 MG tablet Take 150 mg by mouth daily.    Yes Historical Provider, MD    Family History  Family History  Problem Relation Age of Onset  . Heart disease      Social History  History   Social History  . Marital Status: Married    Spouse Name: N/A    Number of Children: N/A  . Years of Education: N/A   Occupational History  . full time    Social History Main Topics  . Smoking status: Light Tobacco Smoker    Types: Cigarettes  . Smokeless tobacco: Never Used  . Alcohol Use: No  . Drug Use: No  . Sexual Activity: Not Currently   Other Topics Concern  . Not on file   Social History Narrative  . No narrative on file     Review of Systems, as per HPI, otherwise negative General:  No chills, fever, night sweats or weight changes.  Cardiovascular:  No chest pain, dyspnea on exertion, edema, orthopnea, palpitations, paroxysmal nocturnal  dyspnea. Dermatological: No rash, lesions/masses Respiratory: No cough, dyspnea Urologic: No hematuria, dysuria Abdominal:   No nausea, vomiting, diarrhea, bright red blood per rectum, melena, or hematemesis Neurologic:  No visual changes, wkns, changes in mental status. All other systems reviewed and are otherwise negative except as noted above.  Physical Exam  There were no vitals taken for this visit.  General: Pleasant, NAD Psych: Normal affect. Neuro: Alert and oriented X 3. Moves all extremities spontaneously. HEENT: Normal  Neck: Supple without bruits or JVD. Lungs:  Resp regular and unlabored, CTA. Heart: RRR no s3, s4, or murmurs. Abdomen: Soft, non-tender, non-distended, BS + x 4.  Extremities: No clubbing, cyanosis or edema. DP/PT/Radials 2+ and equal bilaterally.  Labs:  No results found for this basename: CKTOTAL, CKMB, TROPONINI,  in the last 72 hours Lab Results  Component Value Date   WBC 6.3 10/20/2013   HGB 9.3* 10/20/2013   HCT 30.1* 10/20/2013   MCV 87.8 10/20/2013   PLT 384 10/20/2013   No results found for this basename: NA, K, CL, CO2, BUN, CREATININE, CALCIUM, LABALBU, PROT, BILITOT, ALKPHOS, ALT, AST, GLUCOSE,  in the last 168 hours Lab Results  Component Value Date   CHOL  Value: 137        ATP III CLASSIFICATION:  <200     mg/dL   Desirable  200-239  mg/dL   Borderline High  >=240    mg/dL   High        05/04/2008   HDL 43 05/04/2008   LDLCALC  Value: 84        Total Cholesterol/HDL:CHD Risk Coronary Heart Disease Risk Table                     Men   Women  1/2 Average Risk   3.4   3.3  Average Risk       5.0   4.4  2 X Average Risk   9.6   7.1  3 X Average Risk  23.4   11.0        Use the calculated Patient Ratio above and the CHD Risk Table to determine the patient's CHD Risk.        ATP III CLASSIFICATION (LDL):  <100     mg/dL   Optimal  100-129  mg/dL   Near or Above                    Optimal  130-159  mg/dL   Borderline  160-189  mg/dL   High  >190      mg/dL   Very High 05/04/2008   TRIG 108 08/08/2013   Lab Results  Component Value Date   DDIMER 2.78* 10/20/2013   No components found with this basename: POCBNP,   Accessory Clinical Findings  Echocardiogram - 08/03/2013 Study Conclusions  - Left ventricle: The cavity size was normal. Systolic function was normal. Wall motion was normal; there were no regional wall motion abnormalities. - Left atrium: The atrium was severely dilated.  ECG - SR, normal ECG  Cardiac cath 12/20/2012   Lexiscan nuclear stress test: 11/04/2013 Quantitative Gated Spect Images  QGS EDV: 70 ml  QGS ESV: 22 ml  Impression  Exercise Capacity: Lexiscan with no exercise.  BP Response: Normal blood pressure response.  Clinical Symptoms: Chest tightness  ECG Impression: No significant ST segment change suggestive of ischemia.  Comparison with Prior Nuclear Study: Study is compared with the report of the study from October, 2009  Overall Impression: Normal stress nuclear study. No scar or ischemia. Low risk scan. No change when compared to the report of October, 2009  LV Ejection Fraction: 69%. LV Wall Motion: Normal Wall Motion.    Assessment & Plan   73 year old female with multiple risk factors for CAD including lifelong smoking, known B/L carotid disease, HTN and hyperlipidemia  1. CAD- stable - Severe left circumflex and OM disease treated successfully with PCI x 2 on 12/20/2012. DAT recommended for 1 year. Negative  stress test on 11/07/2013. Discontinue Plavix in December.  2. Chronic diastolic CHF - increase Lasix to 40 mg po daily. Check CMP today. Most probably due to hypoalbuminemia, start compression stockings.  3. Hypertension - controlled   4. Hyperlipidemia - managed by PCP, on high dose of atorvastatin 80 mg PO QHS, we'll check liver enzymes today   4. PAD - B/L 60-79% carotid disease, stable in March 2015, repeat in 1 year.   Follow up in 2 months.  Dorothy Spark, MD,  [facility] 11/10/2013, 1:38 PM

## 2013-11-11 ENCOUNTER — Telehealth: Payer: Self-pay | Admitting: *Deleted

## 2013-11-11 DIAGNOSIS — I10 Essential (primary) hypertension: Secondary | ICD-10-CM

## 2013-11-11 DIAGNOSIS — I739 Peripheral vascular disease, unspecified: Principal | ICD-10-CM

## 2013-11-11 DIAGNOSIS — I5032 Chronic diastolic (congestive) heart failure: Secondary | ICD-10-CM

## 2013-11-11 DIAGNOSIS — E876 Hypokalemia: Secondary | ICD-10-CM

## 2013-11-11 DIAGNOSIS — I779 Disorder of arteries and arterioles, unspecified: Secondary | ICD-10-CM

## 2013-11-11 DIAGNOSIS — E782 Mixed hyperlipidemia: Secondary | ICD-10-CM

## 2013-11-11 LAB — COMPREHENSIVE METABOLIC PANEL
ALT: 7 U/L (ref 0–35)
AST: 15 U/L (ref 0–37)
Albumin: 2.6 g/dL — ABNORMAL LOW (ref 3.5–5.2)
Alkaline Phosphatase: 61 U/L (ref 39–117)
BUN: 14 mg/dL (ref 6–23)
CO2: 26 mEq/L (ref 19–32)
Calcium: 8.8 mg/dL (ref 8.4–10.5)
Chloride: 111 mEq/L (ref 96–112)
Creatinine, Ser: 0.6 mg/dL (ref 0.4–1.2)
GFR: 112.24 mL/min (ref >60.00)
Glucose, Bld: 107 mg/dL — ABNORMAL HIGH (ref 70–99)
Potassium: 3.3 mEq/L — ABNORMAL LOW (ref 3.5–5.1)
Sodium: 143 mEq/L (ref 135–145)
Total Bilirubin: 0.5 mg/dL (ref 0.2–1.2)
Total Protein: 5.8 g/dL — ABNORMAL LOW (ref 6.0–8.3)

## 2013-11-11 MED ORDER — FUROSEMIDE 20 MG PO TABS: 20.0000 mg | ORAL_TABLET | Freq: Every day | ORAL | Status: DC

## 2013-11-11 NOTE — Telephone Encounter (Signed)
Informed the pt that per Dr Meda Coffee her creatinine level was elevated and she should hold her lasix for 5 days and then restart on lasix 20 mg po daily after that.  Informed the pt that per Dr Meda Coffee, she should wear compression stockings and prop her feet up at rest.  Informed the pt also that per Dr Meda Coffee she needs to come in for lab work (BMET) in 2 weeks.  Pt is scheduled for lab work on 11/6 to recheck bmp.  Pt verbalized understanding of instructions given and agrees with this plan.

## 2013-11-11 NOTE — Telephone Encounter (Signed)
Message copied by Nuala Alpha on Fri Nov 11, 2013  6:26 PM ------      Message from: Dorothy Spark      Created: Fri Nov 11, 2013  6:11 PM       Her Crea is elevated, we will have to hold her Lasix for 5 days, then restart 20 mg po daily. She needs to use compression stockings and elevate her legs as much as she can.      Repeat BMP in 2 weeks.      Thank you,      KN ------

## 2013-11-21 ENCOUNTER — Other Ambulatory Visit: Payer: Self-pay

## 2013-11-21 MED ORDER — FUROSEMIDE 20 MG PO TABS: 20.0000 mg | ORAL_TABLET | Freq: Every day | ORAL | Status: DC

## 2013-11-25 ENCOUNTER — Other Ambulatory Visit (INDEPENDENT_AMBULATORY_CARE_PROVIDER_SITE_OTHER): Payer: Commercial Managed Care - HMO | Admitting: *Deleted

## 2013-11-25 DIAGNOSIS — I5032 Chronic diastolic (congestive) heart failure: Secondary | ICD-10-CM

## 2013-11-25 DIAGNOSIS — I10 Essential (primary) hypertension: Secondary | ICD-10-CM

## 2013-11-25 DIAGNOSIS — E876 Hypokalemia: Secondary | ICD-10-CM

## 2013-11-25 DIAGNOSIS — I779 Disorder of arteries and arterioles, unspecified: Secondary | ICD-10-CM

## 2013-11-25 DIAGNOSIS — E782 Mixed hyperlipidemia: Secondary | ICD-10-CM

## 2013-11-25 LAB — BASIC METABOLIC PANEL
BUN: 16 mg/dL (ref 6–23)
CO2: 26 mEq/L (ref 19–32)
Calcium: 9.1 mg/dL (ref 8.4–10.5)
Chloride: 107 mEq/L (ref 96–112)
Creatinine, Ser: 0.7 mg/dL (ref 0.4–1.2)
GFR: 81.36 mL/min (ref >60.00)
Glucose, Bld: 115 mg/dL — ABNORMAL HIGH (ref 70–99)
Potassium: 3.1 mEq/L — ABNORMAL LOW (ref 3.5–5.1)
Sodium: 142 mEq/L (ref 135–145)

## 2013-12-20 ENCOUNTER — Other Ambulatory Visit (INDEPENDENT_AMBULATORY_CARE_PROVIDER_SITE_OTHER): Payer: Self-pay | Admitting: Surgery

## 2013-12-27 ENCOUNTER — Other Ambulatory Visit: Payer: Self-pay | Admitting: Cardiology

## 2013-12-29 ENCOUNTER — Encounter (HOSPITAL_COMMUNITY): Payer: Self-pay | Admitting: Cardiovascular Disease

## 2014-01-09 ENCOUNTER — Ambulatory Visit (INDEPENDENT_AMBULATORY_CARE_PROVIDER_SITE_OTHER): Payer: Commercial Managed Care - HMO | Admitting: Cardiology

## 2014-01-09 ENCOUNTER — Encounter: Payer: Self-pay | Admitting: Cardiology

## 2014-01-09 VITALS — BP 124/62 | HR 73 | Ht 60.0 in | Wt 134.0 lb

## 2014-01-09 DIAGNOSIS — I10 Essential (primary) hypertension: Secondary | ICD-10-CM

## 2014-01-09 DIAGNOSIS — Z01810 Encounter for preprocedural cardiovascular examination: Secondary | ICD-10-CM

## 2014-01-09 DIAGNOSIS — I5032 Chronic diastolic (congestive) heart failure: Secondary | ICD-10-CM

## 2014-01-09 DIAGNOSIS — I2510 Atherosclerotic heart disease of native coronary artery without angina pectoris: Secondary | ICD-10-CM

## 2014-01-09 DIAGNOSIS — I2583 Coronary atherosclerosis due to lipid rich plaque: Principal | ICD-10-CM

## 2014-01-09 NOTE — Patient Instructions (Addendum)
Your physician recommends that you schedule a follow-up appointment in: [place]. Meda Coffee.

## 2014-01-09 NOTE — Progress Notes (Signed)
Patient ID: NOGA FOGG, female   DOB: 1940/03/11, 73 y.o.   MRN: [id number]    Patient Name: [name] Date of Encounter: 01/09/2014  Primary Care Provider:  Laverna Peace, NP Primary Cardiologist:  Dorothy Spark  Problem List   Past Medical History  Diagnosis Date  . Carotid artery disease     a. 60-70% bilat ICA stenosis by dopplers 05/2012.  Marland Kitchen Other and unspecified hyperlipidemia   . CAD (coronary artery disease)     a. Mod dz 2010 initially mgd medically. b. 12/2012 - angina s/p PTCA/DES to mid-circumflex, PTCA/DES to first OM.   Marland Kitchen Hypertension   . Depression   . Barrett's esophagus   . SBO (small bowel obstruction)     a. Lysis of adhesions and ovarian cystectomy 04/2013 for sBO. b. Admitted 07/2013 for colonic obstruction due to suspected colitis, s/p partial colectomy/colostomy 08/01/13. Admission complicated by anasarca, acute resp failure requiring tracheostomy, decannulated 08/25/13. c. Recurrent SBO8/2015, NGT placed for decompression.   . Colonic obstruction due to suspected colitis; s/p colectomy/colostomy     a. See SBO.  Marland Kitchen Protein-calorie malnutrition, severe w/ electrolyte imbalance     a. Severe hypoalbuminemia leading to 3rd spacing including anasarca and pulm edema 07/2013.  Marland Kitchen HTN (hypertension)   . DVT of axillary vein, acute right     a. Dx 07/2013 felt due to R IJ central line that had been inserted 7/14. Not on anticoag due to GIB issues and small cerebral bleed.  . SBO (small bowel obstruction)   . Acute respiratory failure with hypoxia s/p tracheostomy     a. 07/2013 during admission for colonic obstruction.  . Intracranial hemorrhage     a. 08/11/13 - per DC summary, tiny SAH vs SDH done for altered mentation, anticoag stopped including aspirin.  . Anemia     a. 7-08/2013 felt due to recent critical illness/chronic disease.   Past Surgical History  Procedure Laterality Date  . Appendectomy    . Cholecystectomy    . Colon resection    .  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 Rotator cuff repair Left   . Abdominal hysterectomy    . Colon surgery    . Foot surgery    . Hand surgery    . Laparotomy N/A 08/01/2013    Procedure: EXPLORATORY LAPAROTOMY;  Surgeon: Harl Bowie, MD;  Location: [place];  Service: General;  Laterality: N/A;  . Partial colectomy N/A 08/01/2013    Procedure: PARTIAL COLECTOMY;  Surgeon: Harl Bowie, MD;  Location: [place];  Service: General;  Laterality: N/A;  . Colostomy N/A 08/01/2013    Procedure: COLOSTOMY;  Surgeon: Harl Bowie, MD;  Location: [place];  Service: General;  Laterality: N/A;  . Bowel resection N/A 08/01/2013    Procedure: SMALL BOWEL RESECTION;  Surgeon: Harl Bowie, MD;  Location: [place];  Service: General;  Laterality: N/A;  . Tracheostomy      feinstein  . Left heart catheterization with coronary angiogram N/A 12/20/2012    Procedure: LEFT HEART CATHETERIZATION WITH CORONARY ANGIOGRAM;  Surgeon: Blane Ohara, MD;  Location: [facility] CATH [facility];  Service: Cardiovascular;  Laterality: N/A;    Allergies  Allergies  Allergen Reactions  . Pregabalin Swelling    Tongue swelling  . Sulfonamide Derivatives Swelling    Childhood reaction   HPI  Mrs Delehanty was previously  followed by Dr Verl Blalock. She is a pleasant 73 year old female with h/o hyperlipidemia, hypertension, PAD and CAD who returns today for a 1 year follow up. The patient is a lifelong smoker.  She had worsening exertional CP and DOE in November 2014, underwent a cardiac cath on 12/20/2012 with finding of severe stenosis in OM1 and received 2 DES. She was doing well from cardiac standpoint. In May 2015 had a SBO and required partial colon resection and now has a colostomy bag, lost 30 lbs.   She has been experiencing LE edema and SOB since then, got worse 3 weeks ago. She was seen in the ER and scheduled for an outpatient Lexiscan stress that was normal. She  was prescribed 20 mg PO Lasix that helped mildly. LE edema worse toward the end of day. No orthopnea, PND. BNP 300, CXR no congestion. She is complaining of significant bruising.  01/09/2014 - she reports that her LE edema has resolved with increased lasix dose. She was taken off lasix , her potassium was low and she was started on 20 mEq of KCL, she she was taking every other day. She is awaiting colostomy reconstruction surgery on January 24 2014. No chest pain or SOB, just feels tired all the time.    Home Medications  Prior to Admission medications   Medication Sig Start Date End Date Taking? Authorizing Provider  aspirin 81 MG tablet Take 81 mg by mouth 2 (two) times daily.    Yes Historical Provider, MD  atorvastatin (LIPITOR) 80 MG tablet Take 80 mg by mouth daily.   Yes Historical Provider, MD  dicyclomine (BENTYL) 10 MG capsule as directed. 08/20/10  Yes Historical Provider, MD  diphenhydramine-acetaminophen (TYLENOL PM) 25-500 MG TABS Take 1 tablet by mouth at bedtime as needed.     Yes Historical Provider, MD  fish oil-omega-3 fatty acids 1000 MG capsule Take 1 g by mouth daily.     Yes Historical Provider, MD  folic acid (FOLVITE) 601 MCG tablet Take 400 mcg by mouth daily.     Yes Historical Provider, MD  metoprolol succinate (TOPROL-XL) 25 MG 24 hr tablet TAKE 1 TABLET ONCE DAILY. 05/04/12  Yes Renella Cunas, MD  nitroGLYCERIN (NITROSTAT) 0.4 MG SL tablet Place 0.4 mg under the tongue every 5 (five) minutes as needed.     Yes Historical Provider, MD  omeprazole (PRILOSEC) 20 MG capsule Take 20 mg by mouth 2 (two) times daily.     Yes Historical Provider, MD  sertraline (ZOLOFT) 100 MG tablet Take 150 mg by mouth daily.    Yes Historical Provider, MD    Family History  Family History  Problem Relation Age of Onset  . Heart disease      Social History  History   Social History  . Marital Status: Married    Spouse Name: N/A    Number of Children: N/A  . Years of Education:  N/A   Occupational History  . full time    Social History Main Topics  . Smoking status: Light Tobacco Smoker    Types: Cigarettes  . Smokeless tobacco: Never Used  . Alcohol Use: No  . Drug Use: No  . Sexual Activity: Not Currently   Other Topics Concern  . Not on file   Social History Narrative     Review of Systems, as per HPI, otherwise negative General:  No chills, fever, night sweats or weight changes.  Cardiovascular:  No chest pain, dyspnea on exertion, edema,  orthopnea, palpitations, paroxysmal nocturnal dyspnea. Dermatological: No rash, lesions/masses Respiratory: No cough, dyspnea Urologic: No hematuria, dysuria Abdominal:   No nausea, vomiting, diarrhea, bright red blood per rectum, melena, or hematemesis Neurologic:  No visual changes, wkns, changes in mental status. All other systems reviewed and are otherwise negative except as noted above.  Physical Exam  Blood pressure 124/62, pulse 73, height 5' (1.524 m), weight 134 lb (60.782 kg), SpO2 99 %.  General: Pleasant, NAD Psych: Normal affect. Neuro: Alert and oriented X 3. Moves all extremities spontaneously. HEENT: Normal  Neck: Supple without bruits or JVD. Lungs:  Resp regular and unlabored, CTA. Heart: RRR no s3, s4, or murmurs. Abdomen: Soft, non-tender, non-distended, BS + x 4.  Extremities: No clubbing, cyanosis or edema. DP/PT/Radials 2+ and equal bilaterally.  Labs:  No results for input(s): CKTOTAL, CKMB, TROPONINI in the last 72 hours. Lab Results  Component Value Date   WBC 6.3 10/20/2013   HGB 9.3* 10/20/2013   HCT 30.1* 10/20/2013   MCV 87.8 10/20/2013   PLT 384 10/20/2013   No results for input(s): NA, K, CL, CO2, BUN, CREATININE, CALCIUM, PROT, BILITOT, ALKPHOS, ALT, AST, GLUCOSE in the last 168 hours.  Invalid input(s): LABALBU Lab Results  Component Value Date   CHOL  05/04/2008    137        ATP III CLASSIFICATION:  <200     mg/dL   Desirable  200-239  mg/dL   Borderline  High  >=240    mg/dL   High          HDL 43 05/04/2008   LDLCALC  05/04/2008    84        Total Cholesterol/HDL:CHD Risk Coronary Heart Disease Risk Table                     Men   Women  1/2 Average Risk   3.4   3.3  Average Risk       5.0   4.4  2 X Average Risk   9.6   7.1  3 X Average Risk  23.4   11.0        Use the calculated Patient Ratio above and the CHD Risk Table to determine the patient's CHD Risk.        ATP III CLASSIFICATION (LDL):  <100     mg/dL   Optimal  100-129  mg/dL   Near or Above                    Optimal  130-159  mg/dL   Borderline  160-189  mg/dL   High  >190     mg/dL   Very High   TRIG 108 08/08/2013   Lab Results  Component Value Date   DDIMER 2.78* 10/20/2013   Invalid input(s): POCBNP  Accessory Clinical Findings  Echocardiogram - 08/03/2013 Study Conclusions  - Left ventricle: The cavity size was normal. Systolic function was normal. Wall motion was normal; there were no regional wall motion abnormalities. - Left atrium: The atrium was severely dilated.  ECG - SR, normal ECG  Cardiac cath 12/20/2012   Lexiscan nuclear stress test: 11/04/2013 Quantitative Gated Spect Images  QGS EDV: 70 ml  QGS ESV: 22 ml  Impression  Exercise Capacity: Lexiscan with no exercise.  BP Response: Normal blood pressure response.  Clinical Symptoms: Chest tightness  ECG Impression: No significant ST segment change suggestive of ischemia.  Comparison with Prior Nuclear Study: Study  is compared with the report of the study from October, 2009  Overall Impression: Normal stress nuclear study. No scar or ischemia. Low risk scan. No change when compared to the report of October, 2009  LV Ejection Fraction: 69%. LV Wall Motion: Normal Wall Motion.    Assessment & Plan   73 year old female with multiple risk factors for CAD including lifelong smoking, known B/L carotid disease, HTN and hyperlipidemia  1. CAD- stable - Severe left circumflex and OM  disease treated successfully with PCI x 2 on 12/20/2012. DAT recommended for 1 year. Negative stress test on 11/07/2013. Discontinue Plavix in December.  2. Chronic diastolic CHF -hold lasix, she is euvolemic. BMP check on Tuesday, we will follow for hypokalemia.   3. Hypertension - controlled   4. Hyperlipidemia - managed by PCP, on high dose of atorvastatin 80 mg PO QHS, we'll check liver enzymes today   4. PAD - B/L 60-79% carotid disease, stable in March 2015, repeat in 1 year.  5. Preop valuation - she has had a recent negative stress test, no chest pain. No CHF symptoms. There is no contraindication from cardiac standpoint for her surgery.   Follow up in 3 months.  Dorothy Spark, MD, [facility] 01/09/2014, 4:54 PM

## 2014-01-10 ENCOUNTER — Encounter (HOSPITAL_COMMUNITY): Payer: Self-pay | Admitting: Pharmacy Technician

## 2014-01-17 ENCOUNTER — Encounter (HOSPITAL_COMMUNITY): Payer: Self-pay

## 2014-01-17 ENCOUNTER — Encounter (HOSPITAL_COMMUNITY)
Admission: RE | Admit: 2014-01-17 | Discharge: 2014-01-17 | Disposition: A | Discharge status: Home / Self Care | Payer: Commercial Managed Care - HMO | Source: Ambulatory Visit | Attending: Surgery | Admitting: Surgery

## 2014-01-17 DIAGNOSIS — K5660 Unspecified intestinal obstruction: Secondary | ICD-10-CM | POA: Insufficient documentation

## 2014-01-17 DIAGNOSIS — D649 Anemia, unspecified: Secondary | ICD-10-CM | POA: Diagnosis not present

## 2014-01-17 DIAGNOSIS — J438 Other emphysema: Secondary | ICD-10-CM | POA: Diagnosis not present

## 2014-01-17 DIAGNOSIS — I10 Essential (primary) hypertension: Secondary | ICD-10-CM | POA: Insufficient documentation

## 2014-01-17 DIAGNOSIS — F329 Major depressive disorder, single episode, unspecified: Secondary | ICD-10-CM | POA: Diagnosis not present

## 2014-01-17 DIAGNOSIS — K2270 Barrett's esophagus without dysplasia: Secondary | ICD-10-CM | POA: Diagnosis not present

## 2014-01-17 DIAGNOSIS — E785 Hyperlipidemia, unspecified: Secondary | ICD-10-CM | POA: Diagnosis not present

## 2014-01-17 DIAGNOSIS — I2510 Atherosclerotic heart disease of native coronary artery without angina pectoris: Secondary | ICD-10-CM | POA: Diagnosis not present

## 2014-01-17 DIAGNOSIS — I70202 Unspecified atherosclerosis of native arteries of extremities, left leg: Secondary | ICD-10-CM | POA: Insufficient documentation

## 2014-01-17 DIAGNOSIS — F17210 Nicotine dependence, cigarettes, uncomplicated: Secondary | ICD-10-CM | POA: Insufficient documentation

## 2014-01-17 DIAGNOSIS — Z86718 Personal history of other venous thrombosis and embolism: Secondary | ICD-10-CM | POA: Insufficient documentation

## 2014-01-17 DIAGNOSIS — Z933 Colostomy status: Secondary | ICD-10-CM | POA: Diagnosis not present

## 2014-01-17 DIAGNOSIS — I6523 Occlusion and stenosis of bilateral carotid arteries: Secondary | ICD-10-CM | POA: Insufficient documentation

## 2014-01-17 DIAGNOSIS — J449 Chronic obstructive pulmonary disease, unspecified: Secondary | ICD-10-CM | POA: Insufficient documentation

## 2014-01-17 DIAGNOSIS — K529 Noninfective gastroenteritis and colitis, unspecified: Secondary | ICD-10-CM | POA: Insufficient documentation

## 2014-01-17 DIAGNOSIS — Z01818 Encounter for other preprocedural examination: Secondary | ICD-10-CM | POA: Insufficient documentation

## 2014-01-17 DIAGNOSIS — J432 Centrilobular emphysema: Secondary | ICD-10-CM | POA: Diagnosis not present

## 2014-01-17 DIAGNOSIS — Z9049 Acquired absence of other specified parts of digestive tract: Secondary | ICD-10-CM | POA: Diagnosis not present

## 2014-01-17 DIAGNOSIS — K449 Diaphragmatic hernia without obstruction or gangrene: Secondary | ICD-10-CM | POA: Diagnosis not present

## 2014-01-17 HISTORY — DX: Pain in unspecified joint: M25.50

## 2014-01-17 HISTORY — DX: Dizziness and giddiness: R42

## 2014-01-17 HISTORY — DX: Chronic obstructive pulmonary disease, unspecified: J44.9

## 2014-01-17 HISTORY — DX: Other complications of anesthesia, initial encounter: T88.59XA

## 2014-01-17 HISTORY — DX: Personal history of urinary calculi: Z87.442

## 2014-01-17 HISTORY — DX: Personal history of colonic polyps: Z86.010

## 2014-01-17 HISTORY — DX: Cervicalgia: M54.2

## 2014-01-17 HISTORY — DX: Personal history of other diseases of the respiratory system: Z87.09

## 2014-01-17 HISTORY — DX: Pneumonia, unspecified organism: J18.9

## 2014-01-17 HISTORY — DX: Personal history of colon polyps, unspecified: Z86.0100

## 2014-01-17 HISTORY — DX: Personal history of other diseases of the digestive system: Z87.19

## 2014-01-17 HISTORY — DX: Adverse effect of unspecified anesthetic, initial encounter: T41.45XA

## 2014-01-17 HISTORY — DX: Other chronic pain: G89.29

## 2014-01-17 HISTORY — DX: Dorsalgia, unspecified: M54.9

## 2014-01-17 HISTORY — DX: Cerebral infarction, unspecified: I63.9

## 2014-01-17 LAB — CBC
HEMATOCRIT: 31.4 % — AB (ref 36.0–46.0)
HEMOGLOBIN: 9.5 g/dL — AB (ref 12.0–15.0)
MCH: 23.8 pg — ABNORMAL LOW (ref 26.0–34.0)
MCHC: 30.3 g/dL (ref 30.0–36.0)
MCV: 78.5 fL (ref 78.0–100.0)
Platelets: 244 10*3/uL (ref 150–400)
RBC: 4 MIL/uL (ref 3.87–5.11)
RDW: 15.9 % — ABNORMAL HIGH (ref 11.5–15.5)
WBC: 7.3 10*3/uL (ref 4.0–10.5)

## 2014-01-17 LAB — BASIC METABOLIC PANEL
Anion gap: 8 (ref 5–15)
BUN: 13 mg/dL (ref 6–23)
CALCIUM: 9.1 mg/dL (ref 8.4–10.5)
CHLORIDE: 113 meq/L — AB (ref 96–112)
CO2: 22 mmol/L (ref 19–32)
Creatinine, Ser: 0.75 mg/dL (ref 0.50–1.10)
GFR calc Af Amer: >90 mL/min (ref >90)
GFR calc non Af Amer: 81 mL/min — ABNORMAL LOW (ref >90)
GLUCOSE: 114 mg/dL — AB (ref 70–99)
Potassium: 3.4 mmol/L — ABNORMAL LOW (ref 3.5–5.1)
Sodium: 143 mmol/L (ref 135–145)

## 2014-01-17 MED ORDER — CHLORHEXIDINE GLUCONATE 4 % EX LIQD: 60.0000 mL | Freq: Once | CUTANEOUS | Status: DC

## 2014-01-17 NOTE — Pre-Procedure Instructions (Signed)
[name]  01/17/2014   Your procedure is scheduled on:  Tues, Jan 5 @ 12:50 PM  Report to [facility] at 10:45 AM.  Call this number if you have problems the morning of surgery: [phone]   Remember:   Do not eat food or drink liquids after midnight.   Take these medicines the morning of surgery with A SIP OF WATER: Duoneb(if needed),Metoprolol(Toprol),Zofran(Ondansetron),and Pain Pill(if needed)               Stop taking your Aspirin and Fish Oil. No Goody's,BC's,Aleve,Ibuprofen,Fish Oil,or any Herbal Medications   Do not wear jewelry, make-up or nail polish.  Do not wear lotions, powders, or perfumes. You may wear deodorant.  Do not shave 48 hours prior to surgery.   Do not bring valuables to the hospital.  [facility] is not responsible                  for any belongings or valuables.               Contacts, dentures or bridgework may not be worn into surgery.  Leave suitcase in the car. After surgery it may be brought to your room.  For patients admitted to the hospital, discharge time is determined by your                treatment team.               Patients discharged the day of surgery will not be allowed to drive  home.    Special Instructions:  [facility] - Preparing for Surgery  Before surgery, you can play an important role.  Because skin is not sterile, your skin needs to be as free of germs as possible.  You can reduce the number of germs on you skin by washing with CHG (chlorahexidine gluconate) soap before surgery.  CHG is an antiseptic cleaner which kills germs and bonds with the skin to continue killing germs even after washing.  Please DO NOT use if you have an allergy to CHG or antibacterial soaps.  If your skin becomes reddened/irritated stop using the CHG and inform your nurse when you arrive at Short Stay.  Do not shave (including legs and underarms) for at least 48 hours prior to the first CHG shower.  You may shave your  face.  Please follow these instructions carefully:   1.  Shower with CHG Soap the night before surgery and the                                morning of Surgery.  2.  If you choose to wash your hair, wash your hair first as usual with your       normal shampoo.  3.  After you shampoo, rinse your hair and body thoroughly to remove the                      Shampoo.  4.  Use CHG as you would any other liquid soap.  You can apply chg directly       to the skin and wash gently with scrungie or a clean washcloth.  5.  Apply the CHG Soap to your body ONLY FROM THE NECK DOWN.        Do not use on open wounds or open sores.  Avoid contact with your eyes,  ears, mouth and genitals (private parts).  Wash genitals (private parts)       with your normal soap.  6.  Wash thoroughly, paying special attention to the area where your surgery        will be performed.  7.  Thoroughly rinse your body with warm water from the neck down.  8.  DO NOT shower/wash with your normal soap after using and rinsing off       the CHG Soap.  9.  Pat yourself dry with a clean towel.            10.  Wear clean pajamas.            11.  Place clean sheets on your bed the night of your first shower and do not        sleep with pets.  Day of Surgery  Do not apply any lotions/deoderants the morning of surgery.  Please wear clean clothes to the hospital/surgery center.     Please read over the following fact sheets that you were given: Pain Booklet, Coughing and Deep Breathing and Surgical Site Infection Prevention

## 2014-01-17 NOTE — Progress Notes (Addendum)
Echo reports in epic from 2010/2015  Stress test reports in epic from 2009/2015  Heart cath report in epic from 2014  EKG and CXR in epic from 10-20-13  Dr.Nelson is cardiologist with last visit a week ago-report in epic-clearance in epic

## 2014-01-18 LAB — HEMOGLOBIN A1C
Hgb A1c MFr Bld: 6.4 % — ABNORMAL HIGH (ref <5.7)
Mean Plasma Glucose: 137 mg/dL — ABNORMAL HIGH (ref <117)

## 2014-01-18 NOTE — Progress Notes (Signed)
Anesthesia Chart Review:  Patient is a 73 year old female scheduled for colostomy reversal on 01/24/14 by Dr. Coralie Keens. She was admitted for SBO s/p LOA and ovarian cystectomy 04/29/13 [facility]) and recurrent colonic obstruction due to colitis s/p partial colectomy/colostomy 08/01/13 with hospital course complicated by anasarca, HCAP/ARDS with VDRF s/p tracheostomy (decannulated 08/25/13) with recurrent SBO treated with NGT 08/2013.   Other history includes CAD s/p DES to mid CX and OM1 12/20/2012, HTN, right axillary vein DVT 07/2013 due to right IF CVL (not anticoagulated due to GIB and left frontal lobe subcentimeter focal parenchymal hemorrhage by 08/11/13 CT), moderate carotid occlusive disease with severe left SCA stenosis, anemia, smoker, COPD, Barrett's esophagus, hiatal hernia, HLD, depression. She reported being hard to wake up after anesthesia. PCP is Laverna Peace, NP. Cardiologist is Dr. Ena Dawley, last visit 01/09/14. She felt there was no contraindication from a cardiac standpoint for her surgery.   10/20/13 EKG: ST.  11/04/13 Nuclear stress test: Overall Impression: Normal stress nuclear study. No scar or ischemia. Low risk scan. No change when compared to the report of October, 2009. LV Ejection Fraction: 69%. LV Wall Motion: Normal Wall Motion.  08/03/13 echo: - Left ventricle: The cavity size was normal. Systolic function was normal. Wall motion was normal; there were no regional wall motion abnormalities. - Left atrium: The atrium was severely dilated.  12/20/12 cardiac cath: Final Conclusions:  1. Severe left circumflex (95%) and OM1 (80%) disease treated successfully with PCI/Promus DES. 2. Moderate LAD (30-40% mid LAD, 50% D1) and RCA (50% mid RCA) stenosis unchanged from the previous study in 2006.  3. Normal LV function, EF 55-65%.  10/20/13 CXR: IMPRESSION: No radiographic evidence of acute cardiopulmonary disease. Atherosclerosis and coronary artery disease.  Emphysema.  10/20/13 Chest CTA: IMPRESSION: Study is negative for pulmonary emboli. Resolution of previous airspace disease, with trace left pleural effusion. Changes of paraseptal and centrilobular emphysema. Atherosclerosis with 2 vessel coronary artery disease.  04/06/13 carotid duplex: 60-79% bilateral ICA stenosis, severe left SCA stenosis, antegrade vertebral artery flow.  Preoperative labs noted.  H/H 9.5/31.4, stable since 10/20/13. A1C 6.4. Cr 0.75. Plan T&S on arrival, but defer decision for transfusion to surgeon and/or anesthesiologist.  She has been cleared by cardiology for surgery. Unfortunately, she continues to smoke. She had no chest pain or SOB at her recent cardiology visit, and her sats were 100% at PAT.  If no acute cardiopulmonary issues on the day of surgery then I anticipate that she can proceed as planned. Consider post-operative pulmonology evaluation.    George Hugh [facility]/Anesthesiology Phone [phone] 01/18/2014 10:19 AM

## 2014-01-23 MED ORDER — CEFAZOLIN SODIUM-DEXTROSE 2-3 GM-% IV SOLR
2.0000 g | INTRAVENOUS | Status: AC
  Administered 2014-01-24: 2 g via INTRAVENOUS
  Filled 2014-01-23: qty 50

## 2014-01-23 MED ORDER — ALVIMOPAN 12 MG PO CAPS
12.0000 mg | ORAL_CAPSULE | Freq: Once | ORAL | Status: AC
  Administered 2014-01-24: 12 mg via ORAL
  Filled 2014-01-23: qty 1

## 2014-01-23 MED ORDER — METRONIDAZOLE IN NACL 5-0.79 MG/ML-% IV SOLN
500.0000 mg | INTRAVENOUS | Status: AC
  Filled 2014-01-23: qty 100

## 2014-01-23 NOTE — H&P (Signed)
[name] is an 74 y.o. female.   Chief Complaint: colostomy HPI: this is a pleasant [name] with an extensive past history.  She had an exploratory lap at [facility] in April for an acute abdomen.  She apparently had extensive lysis of adhesions and prolonged intubation post op.  Postoperatively, she reports never fully improving and was eventually found to have a colon obstruction at the splenic flexure.  She underwent another exploration at [facility] in July of 2015 where she had to undergo a colon resection and colostomy for the obstruction which was non-malignant.  She was also found to have dense adhesions and had to undergo a small bowel resection as well for multiple enterotomies.  She has since recovered after a prolonged hospital coarse and is adamant that her ostomy be reversed.  Past Medical History  Diagnosis Date  . Carotid artery disease     a. 60-70% bilat ICA stenosis by dopplers 05/2012.  Marland Kitchen Other and unspecified hyperlipidemia     takes Lipitor daily  . CAD (coronary artery disease)     a. Mod dz 2010 initially mgd medically. b. 12/2012 - angina s/p PTCA/DES to mid-circumflex, PTCA/DES to first OM.   Marland Kitchen Barrett's esophagus   . SBO (small bowel obstruction)     a. Lysis of adhesions and ovarian cystectomy 04/2013 for sBO. b. Admitted 07/2013 for colonic obstruction due to suspected colitis, s/p partial colectomy/colostomy 08/01/13. Admission complicated by anasarca, acute resp failure requiring tracheostomy, decannulated 08/25/13. c. Recurrent SBO8/2015, NGT placed for decompression.   . Colonic obstruction due to suspected colitis; s/p colectomy/colostomy     a. See SBO.  Marland Kitchen Protein-calorie malnutrition, severe w/ electrolyte imbalance     a. Severe hypoalbuminemia leading to 3rd spacing including anasarca and pulm edema 07/2013.  Marland Kitchen DVT of axillary vein, acute right     a. Dx 07/2013 felt due to R IJ central line that had been inserted 7/14. Not on anticoag due to  GIB issues and small cerebral bleed.  . Acute respiratory failure with hypoxia s/p tracheostomy 07/2013  . Intracranial hemorrhage     a. 08/11/13 - per DC summary, tiny SAH vs SDH done for altered mentation, anticoag stopped including aspirin.  . Anemia     a. 7-08/2013 felt due to recent critical illness/chronic disease.  . Complication of anesthesia     hard to wake up from anesthesia   . Hypertension     takes Metoprolol daily  . HTN (hypertension)   . Heart murmur   . COPD (chronic obstructive pulmonary disease)   . Pneumonia     hx of->15 yrs ago  . History of bronchitis     > 42yrs ago   . Dizziness     was on Bentyl which caused this-took off of it and no problems since  . Stroke early 80's    right sided weakness--TIA  . Arthritis   . Joint pain   . Chronic back pain     deteriorating;DDD  . Neck pain     DDD  . History of hiatal hernia   . History of colon polyps   . History of kidney stones     was told it was stable but doesn't know for sure that she ever passed it  . Depression     takes Remeron and Zoloft daily    Past Surgical History  Procedure Laterality Date  . Appendectomy    . Cholecystectomy    .  Colon resection    . 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 Rotator cuff repair Left   . Abdominal hysterectomy    . Colon surgery    . Foot surgery Left   . Hand surgery Bilateral   . Laparotomy N/A 08/01/2013    Procedure: EXPLORATORY LAPAROTOMY;  Surgeon: Harl Bowie, MD;  Location: [place];  Service: General;  Laterality: N/A;  . Partial colectomy N/A 08/01/2013    Procedure: PARTIAL COLECTOMY;  Surgeon: Harl Bowie, MD;  Location: [place];  Service: General;  Laterality: N/A;  . Colostomy N/A 08/01/2013    Procedure: COLOSTOMY;  Surgeon: Harl Bowie, MD;  Location: [place];  Service: General;  Laterality: N/A;  . Bowel resection N/A 08/01/2013    Procedure: SMALL BOWEL RESECTION;   Surgeon: Harl Bowie, MD;  Location: [place];  Service: General;  Laterality: N/A;  . Tracheostomy      feinstein  . Left heart catheterization with coronary angiogram N/A 12/20/2012    Procedure: LEFT HEART CATHETERIZATION WITH CORONARY ANGIOGRAM;  Surgeon: Blane Ohara, MD;  Location: [facility] CATH LAB;  Service: Cardiovascular;  Laterality: N/A;  . Cataract surgery Bilateral     Family History  Problem Relation Age of Onset  . Heart disease     Social History:  reports that she has been smoking Cigarettes.  She has a 26.5 pack-year smoking history. She has never used smokeless tobacco. She reports that she does not drink alcohol or use illicit drugs.  Allergies:  Allergies  Allergen Reactions  . Pregabalin Swelling    Tongue swelling  . Sulfonamide Derivatives Swelling    Childhood reaction    No prescriptions prior to admission    No results found for this or any previous visit (from the past 48 hour(s)). No results found.  Review of Systems  All other systems reviewed and are negative.   There were no vitals taken for this visit. Physical Exam  Constitutional: She is oriented to person, place, and time. She appears well-developed and well-nourished.  HENT:  Head: Normocephalic and atraumatic.  Left Ear: External ear normal.  Eyes: Conjunctivae are normal.  Neck: Normal range of motion. No tracheal deviation present.  Cardiovascular: Normal rate and regular rhythm.   Murmur heard. Respiratory: No respiratory distress.  GI: Soft. She exhibits no distension. There is no tenderness.  Open healing midline incision with left sided ostomy  Musculoskeletal: Normal range of motion. She exhibits no edema or tenderness.  Neurological: She is alert and oriented to person, place, and time.  Skin: Skin is warm and dry. No erythema.  Psychiatric: Her behavior is normal.     Assessment/Plan Colostomy in place  At her request, we will proceed with colostomy takedown.  I  discussed the risks in detail including but not limited to bleeding, infection, further injury to the small bowel, need for bowel resection, anastomotic leak, need for further surgery, cardiopulmonary problems, prolonged ventilation, prolonged recovery, and even death, etc.  Despite these risks, she wishes to proceed.  Linda Biehn A 01/23/2014, 8:27 PM

## 2014-01-24 ENCOUNTER — Inpatient Hospital Stay (HOSPITAL_COMMUNITY): Payer: Commercial Managed Care - HMO | Admitting: Vascular Surgery

## 2014-01-24 ENCOUNTER — Encounter (HOSPITAL_COMMUNITY)
Admission: RE | Disposition: A | Discharge status: Home / Self Care | Payer: Self-pay | Source: Ambulatory Visit | Attending: Surgery

## 2014-01-24 ENCOUNTER — Inpatient Hospital Stay (HOSPITAL_COMMUNITY)
Admission: RE | Admit: 2014-01-24 | Discharge: 2014-02-03 | DRG: 344 | Disposition: A | Discharge status: Home / Self Care | Payer: Commercial Managed Care - HMO | Source: Ambulatory Visit | Attending: Surgery | Admitting: Surgery

## 2014-01-24 ENCOUNTER — Encounter (HOSPITAL_COMMUNITY): Payer: Self-pay | Admitting: Certified Registered Nurse Anesthetist

## 2014-01-24 ENCOUNTER — Inpatient Hospital Stay (HOSPITAL_COMMUNITY): Payer: Commercial Managed Care - HMO | Admitting: Anesthesiology

## 2014-01-24 DIAGNOSIS — I2510 Atherosclerotic heart disease of native coronary artery without angina pectoris: Secondary | ICD-10-CM | POA: Diagnosis present

## 2014-01-24 DIAGNOSIS — Z86010 Personal history of colonic polyps: Secondary | ICD-10-CM

## 2014-01-24 DIAGNOSIS — J449 Chronic obstructive pulmonary disease, unspecified: Secondary | ICD-10-CM | POA: Diagnosis present

## 2014-01-24 DIAGNOSIS — Z9049 Acquired absence of other specified parts of digestive tract: Secondary | ICD-10-CM | POA: Diagnosis present

## 2014-01-24 DIAGNOSIS — Z888 Allergy status to other drugs, medicaments and biological substances status: Secondary | ICD-10-CM | POA: Diagnosis not present

## 2014-01-24 DIAGNOSIS — J9600 Acute respiratory failure, unspecified whether with hypoxia or hypercapnia: Secondary | ICD-10-CM | POA: Insufficient documentation

## 2014-01-24 DIAGNOSIS — E785 Hyperlipidemia, unspecified: Secondary | ICD-10-CM | POA: Diagnosis present

## 2014-01-24 DIAGNOSIS — Z8701 Personal history of pneumonia (recurrent): Secondary | ICD-10-CM

## 2014-01-24 DIAGNOSIS — I10 Essential (primary) hypertension: Secondary | ICD-10-CM | POA: Diagnosis present

## 2014-01-24 DIAGNOSIS — J9601 Acute respiratory failure with hypoxia: Secondary | ICD-10-CM | POA: Diagnosis present

## 2014-01-24 DIAGNOSIS — Z9841 Cataract extraction status, right eye: Secondary | ICD-10-CM

## 2014-01-24 DIAGNOSIS — M5136 Other intervertebral disc degeneration, lumbar region: Secondary | ICD-10-CM | POA: Diagnosis present

## 2014-01-24 DIAGNOSIS — Z8673 Personal history of transient ischemic attack (TIA), and cerebral infarction without residual deficits: Secondary | ICD-10-CM | POA: Diagnosis not present

## 2014-01-24 DIAGNOSIS — Z90710 Acquired absence of both cervix and uterus: Secondary | ICD-10-CM | POA: Diagnosis not present

## 2014-01-24 DIAGNOSIS — Z8709 Personal history of other diseases of the respiratory system: Secondary | ICD-10-CM | POA: Diagnosis not present

## 2014-01-24 DIAGNOSIS — M1990 Unspecified osteoarthritis, unspecified site: Secondary | ICD-10-CM | POA: Diagnosis present

## 2014-01-24 DIAGNOSIS — F17210 Nicotine dependence, cigarettes, uncomplicated: Secondary | ICD-10-CM | POA: Diagnosis present

## 2014-01-24 DIAGNOSIS — Z86718 Personal history of other venous thrombosis and embolism: Secondary | ICD-10-CM | POA: Diagnosis not present

## 2014-01-24 DIAGNOSIS — K5660 Unspecified intestinal obstruction: Secondary | ICD-10-CM | POA: Diagnosis present

## 2014-01-24 DIAGNOSIS — D62 Acute posthemorrhagic anemia: Secondary | ICD-10-CM | POA: Diagnosis present

## 2014-01-24 DIAGNOSIS — Z955 Presence of coronary angioplasty implant and graft: Secondary | ICD-10-CM

## 2014-01-24 DIAGNOSIS — E876 Hypokalemia: Secondary | ICD-10-CM | POA: Diagnosis present

## 2014-01-24 DIAGNOSIS — G9340 Encephalopathy, unspecified: Secondary | ICD-10-CM | POA: Diagnosis present

## 2014-01-24 DIAGNOSIS — F329 Major depressive disorder, single episode, unspecified: Secondary | ICD-10-CM | POA: Diagnosis present

## 2014-01-24 DIAGNOSIS — R52 Pain, unspecified: Secondary | ICD-10-CM

## 2014-01-24 DIAGNOSIS — M5030 Other cervical disc degeneration, unspecified cervical region: Secondary | ICD-10-CM | POA: Diagnosis present

## 2014-01-24 DIAGNOSIS — K567 Ileus, unspecified: Secondary | ICD-10-CM | POA: Diagnosis present

## 2014-01-24 DIAGNOSIS — Z87442 Personal history of urinary calculi: Secondary | ICD-10-CM | POA: Diagnosis not present

## 2014-01-24 DIAGNOSIS — K565 Intestinal adhesions [bands] with obstruction (postprocedural) (postinfection): Secondary | ICD-10-CM | POA: Diagnosis not present

## 2014-01-24 DIAGNOSIS — K9400 Colostomy complication, unspecified: Principal | ICD-10-CM | POA: Diagnosis present

## 2014-01-24 DIAGNOSIS — K2270 Barrett's esophagus without dysplasia: Secondary | ICD-10-CM | POA: Diagnosis present

## 2014-01-24 DIAGNOSIS — R0603 Acute respiratory distress: Secondary | ICD-10-CM

## 2014-01-24 DIAGNOSIS — J9611 Chronic respiratory failure with hypoxia: Secondary | ICD-10-CM | POA: Insufficient documentation

## 2014-01-24 DIAGNOSIS — R509 Fever, unspecified: Secondary | ICD-10-CM

## 2014-01-24 DIAGNOSIS — Z9889 Other specified postprocedural states: Secondary | ICD-10-CM

## 2014-01-24 DIAGNOSIS — Z882 Allergy status to sulfonamides status: Secondary | ICD-10-CM | POA: Diagnosis not present

## 2014-01-24 DIAGNOSIS — I359 Nonrheumatic aortic valve disorder, unspecified: Secondary | ICD-10-CM | POA: Diagnosis not present

## 2014-01-24 DIAGNOSIS — Z9842 Cataract extraction status, left eye: Secondary | ICD-10-CM | POA: Diagnosis not present

## 2014-01-24 DIAGNOSIS — J9690 Respiratory failure, unspecified, unspecified whether with hypoxia or hypercapnia: Secondary | ICD-10-CM

## 2014-01-24 HISTORY — PX: COLOSTOMY TAKEDOWN: SHX5783

## 2014-01-24 HISTORY — PX: COLOSTOMY TAKEDOWN: SHX5258

## 2014-01-24 LAB — TYPE AND SCREEN
ABO/RH(D): B POS
Antibody Screen: NEGATIVE

## 2014-01-24 SURGERY — CLOSURE, COLOSTOMY
Anesthesia: General | Site: Abdomen

## 2014-01-24 MED ORDER — NEOSTIGMINE METHYLSULFATE 10 MG/10ML IV SOLN
INTRAVENOUS | Status: DC | PRN
  Administered 2014-01-24: 4 mg via INTRAVENOUS

## 2014-01-24 MED ORDER — HYDROMORPHONE HCL 1 MG/ML IJ SOLN
INTRAMUSCULAR | Status: AC
  Filled 2014-01-24: qty 1

## 2014-01-24 MED ORDER — HYDROMORPHONE HCL 1 MG/ML IJ SOLN: 0.5000 mg | INTRAMUSCULAR | Status: DC | PRN

## 2014-01-24 MED ORDER — IPRATROPIUM-ALBUTEROL 0.5-2.5 (3) MG/3ML IN SOLN
3.0000 mL | Freq: Four times a day (QID) | RESPIRATORY_TRACT | Status: DC | PRN
  Administered 2014-01-26: 3 mL via RESPIRATORY_TRACT
  Filled 2014-01-24 (×2): qty 3

## 2014-01-24 MED ORDER — ARTIFICIAL TEARS OP OINT
TOPICAL_OINTMENT | OPHTHALMIC | Status: DC | PRN
  Administered 2014-01-24: 1 via OPHTHALMIC

## 2014-01-24 MED ORDER — SODIUM CHLORIDE 0.9 % IJ SOLN: 9.0000 mL | INTRAMUSCULAR | Status: DC | PRN

## 2014-01-24 MED ORDER — 0.9 % SODIUM CHLORIDE (POUR BTL) OPTIME
TOPICAL | Status: DC | PRN
  Administered 2014-01-24: 2000 mL
  Administered 2014-01-24: 1000 mL

## 2014-01-24 MED ORDER — ENOXAPARIN SODIUM 40 MG/0.4ML ~~LOC~~ SOLN
40.0000 mg | SUBCUTANEOUS | Status: DC
  Administered 2014-01-25 – 2014-01-27 (×3): 40 mg via SUBCUTANEOUS
  Filled 2014-01-24 (×5): qty 0.4

## 2014-01-24 MED ORDER — ONDANSETRON HCL 4 MG/2ML IJ SOLN: 4.0000 mg | Freq: Four times a day (QID) | INTRAMUSCULAR | Status: DC | PRN

## 2014-01-24 MED ORDER — LIDOCAINE HCL (CARDIAC) 20 MG/ML IV SOLN
INTRAVENOUS | Status: DC | PRN
  Administered 2014-01-24: 80 mg via INTRAVENOUS

## 2014-01-24 MED ORDER — ONDANSETRON HCL 4 MG/2ML IJ SOLN
INTRAMUSCULAR | Status: DC | PRN
  Administered 2014-01-24: 4 mg via INTRAVENOUS

## 2014-01-24 MED ORDER — LACTATED RINGERS IV SOLN
INTRAVENOUS | Status: DC
  Administered 2014-01-24: 11:00:00 via INTRAVENOUS

## 2014-01-24 MED ORDER — LACTATED RINGERS IV SOLN: INTRAVENOUS | Status: DC

## 2014-01-24 MED ORDER — ONDANSETRON HCL 4 MG PO TABS: 4.0000 mg | ORAL_TABLET | Freq: Four times a day (QID) | ORAL | Status: DC | PRN

## 2014-01-24 MED ORDER — LABETALOL HCL 5 MG/ML IV SOLN
5.0000 mg | INTRAVENOUS | Status: DC | PRN
  Administered 2014-01-24: 5 mg via INTRAVENOUS

## 2014-01-24 MED ORDER — NALOXONE HCL 0.4 MG/ML IJ SOLN: 0.4000 mg | INTRAMUSCULAR | Status: DC | PRN

## 2014-01-24 MED ORDER — HYDROMORPHONE HCL 1 MG/ML IJ SOLN
0.2500 mg | INTRAMUSCULAR | Status: DC | PRN
  Administered 2014-01-24 (×5): 0.5 mg via INTRAVENOUS

## 2014-01-24 MED ORDER — DIPHENHYDRAMINE HCL 50 MG/ML IJ SOLN
12.5000 mg | Freq: Four times a day (QID) | INTRAMUSCULAR | Status: DC | PRN
  Administered 2014-01-25 – 2014-01-26 (×2): 12.5 mg via INTRAVENOUS
  Filled 2014-01-24 (×3): qty 1

## 2014-01-24 MED ORDER — HYDROMORPHONE 0.3 MG/ML IV SOLN
INTRAVENOUS | Status: DC
  Administered 2014-01-24: 0.6 mg via INTRAVENOUS
  Administered 2014-01-24 – 2014-01-25 (×2): via INTRAVENOUS
  Administered 2014-01-25: 4.2 mg via INTRAVENOUS
  Administered 2014-01-25: 08:00:00 via INTRAVENOUS
  Administered 2014-01-25: 1.5 mg via INTRAVENOUS
  Administered 2014-01-25: 2.1 mg via INTRAVENOUS
  Administered 2014-01-25: 1.8 mg via INTRAVENOUS
  Administered 2014-01-26: 0.9 mg via INTRAVENOUS
  Administered 2014-01-26: 0.6 mg via INTRAVENOUS
  Administered 2014-01-26: 1.6 mg via INTRAVENOUS
  Filled 2014-01-24 (×2): qty 25

## 2014-01-24 MED ORDER — FENTANYL CITRATE 0.05 MG/ML IJ SOLN
INTRAMUSCULAR | Status: AC
  Filled 2014-01-24: qty 5

## 2014-01-24 MED ORDER — POTASSIUM CHLORIDE IN NACL 20-0.9 MEQ/L-% IV SOLN
INTRAVENOUS | Status: DC
  Administered 2014-01-24 – 2014-01-29 (×9): via INTRAVENOUS
  Filled 2014-01-24 (×16): qty 1000

## 2014-01-24 MED ORDER — HYDROMORPHONE 0.3 MG/ML IV SOLN
INTRAVENOUS | Status: AC
  Filled 2014-01-24: qty 25

## 2014-01-24 MED ORDER — METOPROLOL SUCCINATE ER 25 MG PO TB24
25.0000 mg | ORAL_TABLET | Freq: Every day | ORAL | Status: DC
  Administered 2014-01-25 – 2014-01-27 (×3): 25 mg via ORAL
  Filled 2014-01-24 (×5): qty 1

## 2014-01-24 MED ORDER — FENTANYL CITRATE 0.05 MG/ML IJ SOLN
INTRAMUSCULAR | Status: DC | PRN
  Administered 2014-01-24 (×2): 75 ug via INTRAVENOUS

## 2014-01-24 MED ORDER — ONDANSETRON HCL 4 MG/2ML IJ SOLN
4.0000 mg | Freq: Four times a day (QID) | INTRAMUSCULAR | Status: DC | PRN
  Administered 2014-01-25: 4 mg via INTRAVENOUS
  Filled 2014-01-24: qty 2

## 2014-01-24 MED ORDER — LACTATED RINGERS IV SOLN
INTRAVENOUS | Status: DC | PRN
  Administered 2014-01-24 (×2): via INTRAVENOUS

## 2014-01-24 MED ORDER — PROPOFOL 10 MG/ML IV BOLUS
INTRAVENOUS | Status: DC | PRN
  Administered 2014-01-24: 100 mg via INTRAVENOUS

## 2014-01-24 MED ORDER — GLYCOPYRROLATE 0.2 MG/ML IJ SOLN
INTRAMUSCULAR | Status: DC | PRN
  Administered 2014-01-24: 0.6 mg via INTRAVENOUS

## 2014-01-24 MED ORDER — LABETALOL HCL 5 MG/ML IV SOLN
INTRAVENOUS | Status: AC
  Filled 2014-01-24: qty 4

## 2014-01-24 MED ORDER — LABETALOL HCL 5 MG/ML IV SOLN
INTRAVENOUS | Status: DC | PRN
  Administered 2014-01-24: 5 mg via INTRAVENOUS

## 2014-01-24 MED ORDER — DIPHENHYDRAMINE HCL 12.5 MG/5ML PO ELIX: 12.5000 mg | ORAL_SOLUTION | Freq: Four times a day (QID) | ORAL | Status: DC | PRN

## 2014-01-24 MED ORDER — ROCURONIUM BROMIDE 100 MG/10ML IV SOLN
INTRAVENOUS | Status: DC | PRN
  Administered 2014-01-24: 30 mg via INTRAVENOUS
  Administered 2014-01-24: 10 mg via INTRAVENOUS

## 2014-01-24 MED ORDER — ALVIMOPAN 12 MG PO CAPS
12.0000 mg | ORAL_CAPSULE | Freq: Two times a day (BID) | ORAL | Status: DC
  Administered 2014-01-25 – 2014-01-26 (×4): 12 mg via ORAL
  Filled 2014-01-24 (×7): qty 1

## 2014-01-24 SURGICAL SUPPLY — 66 items
BLADE SURG ROTATE 9660 (MISCELLANEOUS) IMPLANT
CANISTER SUCTION 2500CC (MISCELLANEOUS) ×3 IMPLANT
CHLORAPREP W/TINT 26ML (MISCELLANEOUS) ×3 IMPLANT
COVER MAYO STAND STRL (DRAPES) ×4 IMPLANT
COVER SURGICAL LIGHT HANDLE (MISCELLANEOUS) ×3 IMPLANT
DRAPE LAPAROSCOPIC ABDOMINAL (DRAPES) ×3 IMPLANT
DRAPE UTILITY XL STRL (DRAPES) ×6 IMPLANT
DRAPE WARM FLUID 44X44 (DRAPE) ×3 IMPLANT
DRSG MEPILEX BORDER 4X12 (GAUZE/BANDAGES/DRESSINGS) IMPLANT
DRSG MEPILEX BORDER 4X8 (GAUZE/BANDAGES/DRESSINGS) IMPLANT
DRSG OPSITE POSTOP 4X10 (GAUZE/BANDAGES/DRESSINGS) IMPLANT
DRSG OPSITE POSTOP 4X8 (GAUZE/BANDAGES/DRESSINGS) IMPLANT
DRSG PAD ABDOMINAL 8X10 ST (GAUZE/BANDAGES/DRESSINGS) ×4 IMPLANT
DRSG TELFA 3X8 NADH (GAUZE/BANDAGES/DRESSINGS) ×3 IMPLANT
ELECT BLADE 6.5 EXT (BLADE) ×3 IMPLANT
ELECT CAUTERY BLADE 6.4 (BLADE) ×6 IMPLANT
ELECT REM PT RETURN 9FT ADLT (ELECTROSURGICAL) ×3
ELECTRODE REM PT RTRN 9FT ADLT (ELECTROSURGICAL) ×1 IMPLANT
GAUZE SPONGE 4X4 12PLY STRL (GAUZE/BANDAGES/DRESSINGS) ×2 IMPLANT
GLOVE BIO SURGEON STRL SZ8 (GLOVE) ×2 IMPLANT
GLOVE BIOGEL PI IND STRL 7.0 (GLOVE) IMPLANT
GLOVE BIOGEL PI IND STRL 8 (GLOVE) IMPLANT
GLOVE BIOGEL PI INDICATOR 7.0 (GLOVE) ×2
GLOVE BIOGEL PI INDICATOR 8 (GLOVE) ×4
GLOVE EUDERMIC 7 POWDERFREE (GLOVE) ×4 IMPLANT
GLOVE SURG SIGNA 7.5 PF LTX (GLOVE) ×6 IMPLANT
GLOVE SURG SS PI 7.0 STRL IVOR (GLOVE) ×2 IMPLANT
GLOVE SURG SS PI 8.0 STRL IVOR (GLOVE) ×2 IMPLANT
GOWN STRL REUS W/ TWL LRG LVL3 (GOWN DISPOSABLE) ×4 IMPLANT
GOWN STRL REUS W/ TWL XL LVL3 (GOWN DISPOSABLE) ×2 IMPLANT
GOWN STRL REUS W/TWL LRG LVL3 (GOWN DISPOSABLE) ×6
GOWN STRL REUS W/TWL XL LVL3 (GOWN DISPOSABLE) ×15
KIT BASIN OR (CUSTOM PROCEDURE TRAY) ×3 IMPLANT
KIT ROOM TURNOVER OR (KITS) ×3 IMPLANT
LIGASURE IMPACT 36 18CM CVD LR (INSTRUMENTS) ×1 IMPLANT
NS IRRIG 1000ML POUR BTL (IV SOLUTION) ×6 IMPLANT
PACK GENERAL/GYN (CUSTOM PROCEDURE TRAY) ×3 IMPLANT
PAD ARMBOARD 7.5X6 YLW CONV (MISCELLANEOUS) ×3 IMPLANT
PAD DRESSING TELFA 3X8 NADH (GAUZE/BANDAGES/DRESSINGS) IMPLANT
PENCIL BUTTON HOLSTER BLD 10FT (ELECTRODE) ×3 IMPLANT
RELOAD PROXIMATE 75MM BLUE (ENDOMECHANICALS) ×3 IMPLANT
RELOAD STAPLE 75 3.8 BLU REG (ENDOMECHANICALS) IMPLANT
SPONGE LAP 18X18 X RAY DECT (DISPOSABLE) ×6 IMPLANT
STAPLER GUN LINEAR PROX 60 (STAPLE) ×2 IMPLANT
STAPLER PROXIMATE 75MM BLUE (STAPLE) ×2 IMPLANT
STAPLER VISISTAT 35W (STAPLE) ×3 IMPLANT
SUCTION POOLE TIP (SUCTIONS) ×3 IMPLANT
SURGILUBE 2OZ TUBE FLIPTOP (MISCELLANEOUS) IMPLANT
SUT NOVA 1 T20/GS 25DT (SUTURE) ×4 IMPLANT
SUT PDS AB 1 TP1 96 (SUTURE) ×6 IMPLANT
SUT PROLENE 2 0 CT2 30 (SUTURE) IMPLANT
SUT PROLENE 2 0 KS (SUTURE) IMPLANT
SUT SILK 2 0 SH CR/8 (SUTURE) ×3 IMPLANT
SUT SILK 2 0 TIES 10X30 (SUTURE) ×3 IMPLANT
SUT SILK 3 0 SH CR/8 (SUTURE) ×3 IMPLANT
SUT SILK 3 0 TIES 10X30 (SUTURE) ×3 IMPLANT
SUT VIC AB 3-0 SH 8-18 (SUTURE) IMPLANT
SYR BULB IRRIGATION 50ML (SYRINGE) ×3 IMPLANT
TAPE CLOTH SURG 6X10 WHT LF (GAUZE/BANDAGES/DRESSINGS) ×2 IMPLANT
TOWEL OR 17X26 10 PK STRL BLUE (TOWEL DISPOSABLE) ×6 IMPLANT
TRAY FOLEY CATH 14FRSI W/METER (CATHETERS) ×2 IMPLANT
TRAY PROCTOSCOPIC FIBER OPTIC (SET/KITS/TRAYS/PACK) IMPLANT
TUBE CONNECTING 12'X1/4 (SUCTIONS) ×1
TUBE CONNECTING 12X1/4 (SUCTIONS) ×2 IMPLANT
WATER STERILE IRR 1000ML POUR (IV SOLUTION) IMPLANT
YANKAUER SUCT BULB TIP NO VENT (SUCTIONS) ×3 IMPLANT

## 2014-01-24 NOTE — Op Note (Signed)
COLOSTOMY TAKEDOWN  Procedure Note  [name] 01/24/2014   Pre-op Diagnosis: Colostomy     Post-op Diagnosis: same  Procedure(s): COLOSTOMY TAKEDOWN LYSIS OF ADHESIONS (1 HOUR)   Surgeon(s): Coralie Keens, MD Fanny Skates, MD  Anesthesia: General  Staff:  Circulator: Beryle Lathe, RN; Sharlot Gowda, RN Scrub Person: Leslie Andrea, CST; Festus Holts Collins-McDonald, RN  Estimated Blood Loss: Minimal               Specimens: sent to path          [name]   Date: 01/24/2014  Time: 3:12 PM

## 2014-01-24 NOTE — Anesthesia Postprocedure Evaluation (Signed)
  Anesthesia Post-op Note  Patient: [name]  Procedure(s) Performed: Procedure(s): COLOSTOMY TAKEDOWN (N/A)  Patient Location: PACU  Anesthesia Type:General  Level of Consciousness: awake, alert , oriented and patient cooperative  Airway and Oxygen Therapy: Patient Spontanous Breathing  Post-op Pain: moderate  Post-op Assessment: Post-op Vital signs reviewed, Patient's Cardiovascular Status Stable, Respiratory Function Stable, Patent Airway, No signs of Nausea or vomiting and Pain level controlled  Post-op Vital Signs: stable  Last Vitals:  Filed Vitals:   01/24/14 1615  BP: 181/86  Pulse: 81  Temp:   Resp: 17    Complications: No apparent anesthesia complications

## 2014-01-24 NOTE — Transfer of Care (Signed)
Immediate Anesthesia Transfer of Care Note  Patient: [name]  Procedure(s) Performed: Procedure(s): COLOSTOMY TAKEDOWN (N/A)  Patient Location: PACU  Anesthesia Type:General  Level of Consciousness: awake, patient cooperative and lethargic  Airway & Oxygen Therapy: Patient Spontanous Breathing and Patient connected to face mask oxygen  Post-op Assessment: Report given to PACU RN, Post -op Vital signs reviewed and stable and Patient moving all extremities  Post vital signs: Reviewed and stable  Complications: No apparent anesthesia complications

## 2014-01-24 NOTE — Anesthesia Procedure Notes (Signed)
Procedure Name: Intubation Date/Time: 01/24/2014 1:19 PM Performed by: Ned Grace Pre-anesthesia Checklist: Patient identified, Timeout performed, Emergency Drugs available, Suction available and Patient being monitored Patient Re-evaluated:Patient Re-evaluated prior to inductionOxygen Delivery Method: Circle system utilized Preoxygenation: Pre-oxygenation with 100% oxygen Intubation Type: IV induction Ventilation: Mask ventilation without difficulty Laryngoscope Size: Mac and 3 Grade View: Grade I Tube type: Oral Tube size: 7.0 mm Number of attempts: 1 Airway Equipment and Method: Stylet Placement Confirmation: ETT inserted through vocal cords under direct vision,  breath sounds checked- equal and bilateral and positive ETCO2 Secured at: 22 cm Tube secured with: Tape Dental Injury: Teeth and Oropharynx as per pre-operative assessment

## 2014-01-24 NOTE — Anesthesia Preprocedure Evaluation (Signed)
Anesthesia Evaluation  Patient identified by MRN, date of birth, ID band Patient awake    Reviewed: Allergy & Precautions, NPO status , Patient's Chart, lab work & pertinent test results  Airway Mallampati: II   Neck ROM: full    Dental   Pulmonary COPDCurrent Smoker,          Cardiovascular hypertension, + angina + CAD, + Cardiac Stents, + Peripheral Vascular Disease and +CHF     Neuro/Psych Depression CVA    GI/Hepatic hiatal hernia,   Endo/Other    Renal/GU      Musculoskeletal  (+) Arthritis -,   Abdominal   Peds  Hematology   Anesthesia Other Findings   Reproductive/Obstetrics                             Anesthesia Physical Anesthesia Plan  ASA: III  Anesthesia Plan: General   Post-op Pain Management:    Induction: Intravenous  Airway Management Planned: Oral ETT  Additional Equipment:   Intra-op Plan:   Post-operative Plan: Extubation in OR  Informed Consent: I have reviewed the patients History and Physical, chart, labs and discussed the procedure including the risks, benefits and alternatives for the proposed anesthesia with the patient or authorized representative who has indicated his/her understanding and acceptance.     Plan Discussed with: CRNA, Anesthesiologist and Surgeon  Anesthesia Plan Comments:         Anesthesia Quick Evaluation

## 2014-01-25 ENCOUNTER — Encounter (HOSPITAL_COMMUNITY): Payer: Self-pay | Admitting: General Practice

## 2014-01-25 LAB — CBC
HCT: 31 % — ABNORMAL LOW (ref 36.0–46.0)
Hemoglobin: 9.3 g/dL — ABNORMAL LOW (ref 12.0–15.0)
MCH: 24.3 pg — ABNORMAL LOW (ref 26.0–34.0)
MCHC: 30 g/dL (ref 30.0–36.0)
MCV: 80.9 fL (ref 78.0–100.0)
PLATELETS: 366 10*3/uL (ref 150–400)
RBC: 3.83 MIL/uL — ABNORMAL LOW (ref 3.87–5.11)
RDW: 16.3 % — AB (ref 11.5–15.5)
WBC: 16.2 10*3/uL — ABNORMAL HIGH (ref 4.0–10.5)

## 2014-01-25 LAB — BASIC METABOLIC PANEL
Anion gap: 11 (ref 5–15)
BUN: 16 mg/dL (ref 6–23)
CO2: 17 mmol/L — ABNORMAL LOW (ref 19–32)
CREATININE: 1.17 mg/dL — AB (ref 0.50–1.10)
Calcium: 8.9 mg/dL (ref 8.4–10.5)
Chloride: 116 mEq/L — ABNORMAL HIGH (ref 96–112)
GFR calc Af Amer: 52 mL/min — ABNORMAL LOW (ref >90)
GFR, EST NON AFRICAN AMERICAN: 45 mL/min — AB (ref >90)
Glucose, Bld: 133 mg/dL — ABNORMAL HIGH (ref 70–99)
Potassium: 4.9 mmol/L (ref 3.5–5.1)
SODIUM: 144 mmol/L (ref 135–145)

## 2014-01-25 MED ORDER — CETYLPYRIDINIUM CHLORIDE 0.05 % MT LIQD
7.0000 mL | Freq: Two times a day (BID) | OROMUCOSAL | Status: DC
  Administered 2014-01-25 – 2014-01-28 (×8): 7 mL via OROMUCOSAL

## 2014-01-25 MED ORDER — SODIUM CHLORIDE 0.9 % IV BOLUS (SEPSIS)
1000.0000 mL | Freq: Once | INTRAVENOUS | Status: AC
  Administered 2014-01-25: 1000 mL via INTRAVENOUS

## 2014-01-25 NOTE — Progress Notes (Signed)
1 Day Post-Op  Subjective: Complains of incisional pain  Objective: Vital signs in last 24 hours: Temp:  [97.8 F (36.6 C)-98.6 F (37 C)] 98.1 F (36.7 C) (01/06 0532) Pulse Rate:  [69-115] 115 (01/06 0532) Resp:  [14-26] 18 (01/06 0532) BP: (82-194)/(41-86) 82/42 mmHg (01/06 0532) SpO2:  [90 %-100 %] 96 % (01/06 0532) Weight:  [137 lb 14 oz (62.54 kg)] 137 lb 14 oz (62.54 kg) (01/05 1027)    Intake/Output from previous day: 01/05 0701 - 01/06 0700 In: 3008.7 [I.V.:3008.7] Out: 375 [Urine:325; Blood:50] Intake/Output this shift: Total I/O In: 1008.7 [I.V.:1008.7] Out: -   Lungs clear Abdomen soft Dressing dry  Lab Results:   Recent Labs  01/25/14 0412  WBC 16.2*  HGB 9.3*  HCT 31.0*  PLT 366   BMET No results for input(s): NA, K, CL, CO2, GLUCOSE, BUN, CREATININE, CALCIUM in the last 72 hours. PT/INR No results for input(s): LABPROT, INR in the last 72 hours. ABG No results for input(s): PHART, HCO3 in the last 72 hours.  Invalid input(s): PCO2, PO2  Studies/Results: No results found.  Anti-infectives: Anti-infectives    Start     Dose/Rate Route Frequency Ordered Stop   01/24/14 0600  ceFAZolin (ANCEF) IVPB 2 g/50 mL premix     2 g100 mL/hr over 30 Minutes Intravenous On call to O.R. 01/23/14 1419 01/24/14 1321   01/24/14 0600  metroNIDAZOLE (FLAGYL) IVPB 500 mg     500 mg100 mL/hr over 60 Minutes Intravenous On call to O.R. 01/23/14 1419 01/25/14 0559      Assessment/Plan: s/p Procedure(s): COLOSTOMY TAKEDOWN (N/A)   Will d/c NG and Foley Start liquids entereg protocol Pulmonary toilet Bolus IVF  LOS: 1 day    [name] A 01/25/2014

## 2014-01-25 NOTE — Progress Notes (Signed)
MD made aware of bladder scan volume and ordered for foley to be placed.   [name]

## 2014-01-25 NOTE — Progress Notes (Signed)
Nutrition Brief Note  Patient identified on the Malnutrition Screening Tool (MST) Report  Wt Readings from Last 15 Encounters:  01/24/14 137 lb 14 oz (62.54 kg)  01/17/14 137 lb 14.4 oz (62.551 kg)  01/09/14 134 lb (60.782 kg)  11/10/13 134 lb (60.782 kg)  11/04/13 132 lb (59.875 kg)  09/19/13 122 lb 6.4 oz (55.52 kg)  09/16/13 134 lb (60.782 kg)  08/24/13 158 lb 11.7 oz (72 kg)  03/31/13 162 lb (73.483 kg)  01/04/13 160 lb (72.576 kg)  12/21/12 162 lb 11.2 oz (73.8 kg)  12/13/12 158 lb (71.668 kg)  12/05/11 160 lb (72.576 kg)  11/20/10 168 lb (76.204 kg)  11/08/09 175 lb (79.379 kg)   this is a pleasant [name] with an extensive past history. She had an exploratory lap at [facility] in April for an acute abdomen. She apparently had extensive lysis of adhesions and prolonged intubation post op. Postoperatively, she reports never fully improving and was eventually found to have a colon obstruction at the splenic flexure. She underwent another exploration at [facility] in July of 2015 where she had to undergo a colon resection and colostomy for the obstruction which was non-malignant. She was also found to have dense adhesions and had to undergo a small bowel resection as well for multiple enterotomies. She has since recovered after a prolonged hospital coarse and is adamant that her ostomy be reversed.  s/p Procedure(s) on 01/24/14: COLOSTOMY TAKEDOWN (N/A)  Spoke with pt and husband at bedside. Both reports hx of weight loss approximately one year ago, due to extensive hospitalization, however, pt reports she has worked hard to gain the weight back. Appetite has been good PTA. She reports UBW of 155#. Wt hx reveals wt gain trend since August.   She is tolerating clear liquids well. She reports she has tried all supplements and does not like them; adamantly refuses offer of any supplements. Educated pt on importance of good PO intake to support healing process.  Nutrition-focused physical exam WDL.   Body mass index is 26.93 kg/(m^2). Patient meets criteria for overweight based on current BMI.   Current diet order is clear liquid, patient is consuming approximately n/a% (good appetite per pt) of meals at this time. Labs and medications reviewed.   No nutrition interventions warranted at this time. If nutrition issues arise, please consult RD.   Esten Dollar A. Jimmye Norman, RD, LDN, CDE Pager: [phone] After hours Pager: [phone]

## 2014-01-25 NOTE — Progress Notes (Signed)
Pt unable to urinate at this time.  Unable to bladder scan due to surgical dressing.  PA paged for assistance.  Will continue to monitor. [name]

## 2014-01-25 NOTE — Progress Notes (Signed)
PA gave ok to remove surgical dressing enough to bladder scan pt.  Most bladder scan volume revealed was 76mL.  Pt does not have urgency to urinate right now.  Will continue monitoring and encourage oral fluid intake. Graceann Congress

## 2014-01-25 NOTE — Op Note (Signed)
NAMEJONNY, LONGINO NO.:  [id number]  MEDICAL RECORD NO.:  [id number]  LOCATION:  6N25C                        FACILITY:  [facility]  PHYSICIAN:  Coralie Keens, M.D. DATE OF BIRTH:  03-14-1940  DATE OF PROCEDURE:  01/24/2014 DATE OF DISCHARGE:                              OPERATIVE REPORT   PREOPERATIVE DIAGNOSIS:  Colostomy in place.  POSTOPERATIVE DIAGNOSIS:  Colostomy in place.  PROCEDURE: 1. Colostomy takedown with colocolonic reanastomosis. 2. Lysis of adhesions (1 hour).  SURGEON:  Coralie Keens, M.D.  ASSISTANT:  Edsel Petrin. Dalbert Batman, M.D.  ANESTHESIA:  General endotracheal anesthesia.  ESTIMATED BLOOD LOSS:  Minimal.  INDICATIONS:  This is a 74 year old female who had undergone an emergent exploratory laparotomy for suspected small bowel obstruction at [facility].  She had presented several months later with a colonic stricture at our institution.  She underwent an exploratory laparotomy with a colon resection and colostomy.  She also had to have a small bowel resected for multiple enterotomies that she had dense adhesions. She was found recovered from that procedure and now wished to proceed with colostomy takedown and colon reanastomosis.  FINDINGS:  The patient was found to have dense adhesions again in the abdomen.  The transverse colon distally was able to be anastomosed in a side-to-side fashion to the distal descending colon.  PROCEDURE IN DETAIL:  The patient was brought to the operating room, identified as Elpidio Anis.  She was placed supine on the operating room table.  General anesthesia was induced.  Her Foley catheter was inserted.  Her ostomy was closed with a running interlocked 2-0 silk suture.  Her abdomen was then prepped and draped in usual sterile fashion.  I created a midline incision with a #10 scalpel going down to the old scar and ellipsing out her midline open wound.  I then took this down to the fascia  in the upper abdomen with electrocautery.  I was able to enter the abdominal cavity.  I visualized the liver in this area.  I then worked distally taking out the old scar and I opened up the fascia and freeing up all adhesions to the abdominal wall.  Those adhesions were omentum to the midline.  It took approximately 1 hour to lyse all the adhesions in order to separate the colon from the small intestines. During this, we were able to visualize the transverse colon coming out of the colostomy and performed elliptical incision around the ostomy site and took this down circumferentially around the ostomy to the fascia.  We were then able to invert the ostomy back into the abdominal cavity.  I then completely free up all the remaining attachments.  We were then able to identify the ligament of Treitz and moved distally lysed adhesions to the small bowel.  There was extensive small bowel stuck at the lowest part of the midline incision and these were lysed with the Metzenbaum scissors.  Next, we were able to identify the distal colon and sigmoid colon and separated from adhesions of small bowel to the previous staple line.  I could also visualize the previous small bowel anastomosis which appeared intact.  Again, after 1 hour lysis of adhesions, we were finally  able to free up enough of the small bowel as well as proximal distal colon to perform the colonic anastomosis. First, we transected the distal end of the transverse colon at the ostomy site with a GIA-75 stapler and sent the ostomy off to Pathology for evaluation.  We then reapproximated the distal transverse colon to the proximal sigmoid colon in a side-to-side fashion with interrupted silk sutures.  Two separate colotomy was performed and then a side-to- side anastomosis was performed with a single firing of the GIA-75 stapler.  The open end was then closed with a TA-60 stapler.  A pink well-perfused adequate sized anastomosis  appeared to be achieved.  I then placed 1 more chronic stitch at the suture line with a 3-0 silk suture.  At this point, we irrigated the abdomen with several liters of normal saline.  We evaluated the small bowel and found no evidence of injuries.  A small serosal defect in the cecum was closed with a single silk suture.  A nasogastric tube was inserted and it was found to be in appropriate location in the stomach.  At this point, the fascial defect for the ostomy was so close to the midline fascial defect, we decided to remove the bridge of tissue between these 2 areas and incorporate the ostomy closure into the midline wound.  The patient's midline incision was then closed with a running #1 looped PDS suture as well as interrupted #1 Novafil internal retention sutures.  The skin and wound were then irrigated and closed in staples.  Telfa wicks were then applied.  The patient tolerated the procedure well.  All the counts were correct at the end of the procedure.  The patient was then extubated in the operating room and taken in stable condition to the recovery room.     Coralie Keens, M.D.     DB/MEDQ  D:  01/24/2014  T:  01/25/2014  Job:  [id number]

## 2014-01-25 NOTE — Progress Notes (Signed)
UR completed.  Gotti Alwin, RN BSN MHA CCM Trauma/Neuro ICU Case Manager [phone]  

## 2014-01-26 ENCOUNTER — Encounter (HOSPITAL_COMMUNITY): Payer: Self-pay | Admitting: Surgery

## 2014-01-26 LAB — CBC
HCT: 26.9 % — ABNORMAL LOW (ref 36.0–46.0)
HEMOGLOBIN: 8.3 g/dL — AB (ref 12.0–15.0)
MCH: 24.1 pg — AB (ref 26.0–34.0)
MCHC: 30.9 g/dL (ref 30.0–36.0)
MCV: 78 fL (ref 78.0–100.0)
Platelets: 335 10*3/uL (ref 150–400)
RBC: 3.45 MIL/uL — AB (ref 3.87–5.11)
RDW: 16.4 % — ABNORMAL HIGH (ref 11.5–15.5)
WBC: 10.9 10*3/uL — ABNORMAL HIGH (ref 4.0–10.5)

## 2014-01-26 LAB — BASIC METABOLIC PANEL
ANION GAP: 7 (ref 5–15)
BUN: 20 mg/dL (ref 6–23)
CHLORIDE: 110 meq/L (ref 96–112)
CO2: 22 mmol/L (ref 19–32)
CREATININE: 0.75 mg/dL (ref 0.50–1.10)
Calcium: 8.9 mg/dL (ref 8.4–10.5)
GFR calc Af Amer: >90 mL/min (ref >90)
GFR calc non Af Amer: 81 mL/min — ABNORMAL LOW (ref >90)
Glucose, Bld: 148 mg/dL — ABNORMAL HIGH (ref 70–99)
Potassium: 4.2 mmol/L (ref 3.5–5.1)
Sodium: 139 mmol/L (ref 135–145)

## 2014-01-26 MED ORDER — PROMETHAZINE HCL 25 MG/ML IJ SOLN: 12.5000 mg | Freq: Four times a day (QID) | INTRAMUSCULAR | Status: DC | PRN

## 2014-01-26 MED ORDER — ONDANSETRON HCL 4 MG PO TABS: 4.0000 mg | ORAL_TABLET | Freq: Four times a day (QID) | ORAL | Status: DC | PRN

## 2014-01-26 MED ORDER — ONDANSETRON HCL 4 MG/2ML IJ SOLN
4.0000 mg | Freq: Four times a day (QID) | INTRAMUSCULAR | Status: DC | PRN
  Filled 2014-01-26: qty 2

## 2014-01-26 MED ORDER — DIPHENHYDRAMINE HCL 12.5 MG/5ML PO ELIX: 12.5000 mg | ORAL_SOLUTION | Freq: Four times a day (QID) | ORAL | Status: DC | PRN

## 2014-01-26 MED ORDER — SODIUM CHLORIDE 0.9 % IV BOLUS (SEPSIS)
1000.0000 mL | Freq: Once | INTRAVENOUS | Status: AC
  Administered 2014-01-26: 1000 mL via INTRAVENOUS

## 2014-01-26 MED ORDER — PANTOPRAZOLE SODIUM 40 MG IV SOLR: 40.0000 mg | Freq: Once | INTRAVENOUS | Status: DC

## 2014-01-26 MED ORDER — PANTOPRAZOLE SODIUM 40 MG IV SOLR
40.0000 mg | INTRAVENOUS | Status: DC
  Administered 2014-01-26 – 2014-01-31 (×6): 40 mg via INTRAVENOUS
  Filled 2014-01-26 (×6): qty 40

## 2014-01-26 MED ORDER — DIPHENHYDRAMINE HCL 50 MG/ML IJ SOLN
12.5000 mg | Freq: Four times a day (QID) | INTRAMUSCULAR | Status: DC | PRN
  Filled 2014-01-26: qty 1

## 2014-01-26 MED ORDER — SODIUM CHLORIDE 0.9 % IV SOLN
INTRAVENOUS | Status: DC
  Administered 2014-01-26: 04:00:00 via INTRAVENOUS
  Filled 2014-01-26 (×2): qty 250

## 2014-01-26 MED ORDER — MORPHINE SULFATE 2 MG/ML IJ SOLN
2.0000 mg | INTRAMUSCULAR | Status: DC | PRN
  Administered 2014-01-26: 4 mg via INTRAVENOUS
  Administered 2014-01-26 – 2014-01-27 (×5): 2 mg via INTRAVENOUS
  Administered 2014-01-27 (×2): 4 mg via INTRAVENOUS
  Administered 2014-01-28: 2 mg via INTRAVENOUS
  Filled 2014-01-26: qty 1
  Filled 2014-01-26: qty 2
  Filled 2014-01-26: qty 1
  Filled 2014-01-26: qty 2
  Filled 2014-01-26: qty 1
  Filled 2014-01-26: qty 2
  Filled 2014-01-26: qty 1
  Filled 2014-01-26: qty 2
  Filled 2014-01-26 (×2): qty 1

## 2014-01-26 MED ORDER — NALOXONE HCL 0.4 MG/ML IJ SOLN: 0.4000 mg | INTRAMUSCULAR | Status: DC | PRN

## 2014-01-26 MED ORDER — ONDANSETRON HCL 4 MG/2ML IJ SOLN: 4.0000 mg | Freq: Four times a day (QID) | INTRAMUSCULAR | Status: DC | PRN

## 2014-01-26 MED ORDER — MORPHINE SULFATE (PF) 1 MG/ML IV SOLN
INTRAVENOUS | Status: DC
  Administered 2014-01-26: 10:00:00 via INTRAVENOUS
  Administered 2014-01-26: 10.5 mg via INTRAVENOUS
  Filled 2014-01-26: qty 25

## 2014-01-26 MED ORDER — SODIUM CHLORIDE 0.9 % IJ SOLN: 9.0000 mL | INTRAMUSCULAR | Status: DC | PRN

## 2014-01-26 MED ORDER — DIPHENHYDRAMINE HCL 50 MG/ML IJ SOLN
12.5000 mg | Freq: Four times a day (QID) | INTRAMUSCULAR | Status: DC | PRN
  Administered 2014-01-26 (×2): 12.5 mg via INTRAVENOUS
  Filled 2014-01-26: qty 1

## 2014-01-26 MED ORDER — LORAZEPAM 2 MG/ML IJ SOLN
1.0000 mg | Freq: Four times a day (QID) | INTRAMUSCULAR | Status: DC | PRN
  Administered 2014-01-26 – 2014-01-28 (×3): 1 mg via INTRAVENOUS
  Filled 2014-01-26 (×3): qty 1

## 2014-01-26 NOTE — Progress Notes (Signed)
Patient is tachypnic and c/o pain 10/10 points to upper abdomen. Vs 98.4-125-40-154/78. O2 sat is 97% on 3L. Paged respiratory to administer breathing treatment. Dr. Grandville Silos notified. Orders received. Will continue to monitor.

## 2014-01-26 NOTE — Progress Notes (Signed)
2 Days Post-Op  Subjective: Complains of nausea, incisional pain, and rash  Objective: Vital signs in last 24 hours: Temp:  [99.1 F (37.3 C)-100.4 F (38 C)] 99.2 F (37.3 C) (01/07 0601) Pulse Rate:  [108-116] 110 (01/07 0601) Resp:  [8-29] 26 (01/07 0601) BP: (123-152)/(45-64) 134/60 mmHg (01/07 0601) SpO2:  [94 %-100 %] 100 % (01/07 0759)    Intake/Output from previous day: 01/06 0701 - 01/07 0700 In: 1702 [P.O.:440; I.V.:1262] Out: 650 [Urine:650] Intake/Output this shift:    Abdomen mildly distended. soft  Lab Results:   Recent Labs  01/25/14 0412  WBC 16.2*  HGB 9.3*  HCT 31.0*  PLT 366   BMET  Recent Labs  01/25/14 0412  NA 144  K 4.9  CL 116*  CO2 17*  GLUCOSE 133*  BUN 16  CREATININE 1.17*  CALCIUM 8.9   PT/INR No results for input(s): LABPROT, INR in the last 72 hours. ABG No results for input(s): PHART, HCO3 in the last 72 hours.  Invalid input(s): PCO2, PO2  Studies/Results: No results found.  Anti-infectives: Anti-infectives    Start     Dose/Rate Route Frequency Ordered Stop   01/24/14 0600  ceFAZolin (ANCEF) IVPB 2 g/50 mL premix     2 g100 mL/hr over 30 Minutes Intravenous On call to O.R. 01/23/14 1419 01/24/14 1321   01/24/14 0600  metroNIDAZOLE (FLAGYL) IVPB 500 mg     500 mg100 mL/hr over 60 Minutes Intravenous On call to O.R. 01/23/14 1419 01/25/14 0559      Assessment/Plan: s/p Procedure(s): COLOSTOMY TAKEDOWN (N/A)  Low UOP.  Will leave foley for at least 24 more hours to monitor closely her I's and O's Change meds If nausea persists, she may need an NG again  LOS: 2 days    Myalynn Lingle A 01/26/2014

## 2014-01-26 NOTE — Progress Notes (Signed)
Patient noted to have rashes in the lower and upper extremities, c/o itching but no difficulty breathing and tongue not swollen.  Administered PRN Benadryl 12.5 mg IV Inj. and paged on call CCS MD.  Doreen Salvage, MD returned call and reviewed medications taken by patient.  Chucky May, MD then issued new orders for Phenergan and protonix (pls. Refer to [facility]) and to discontinue Zofran.  Rashes now gone at upper extremities and lightly diminished at lower extremities. Patient resting comfortably with both eyes closed.

## 2014-01-27 ENCOUNTER — Inpatient Hospital Stay (HOSPITAL_COMMUNITY): Payer: Commercial Managed Care - HMO

## 2014-01-27 LAB — LACTIC ACID, PLASMA: LACTIC ACID, VENOUS: 1.1 mmol/L (ref 0.5–2.2)

## 2014-01-27 LAB — BASIC METABOLIC PANEL
ANION GAP: 10 (ref 5–15)
BUN: 21 mg/dL (ref 6–23)
CO2: 16 mmol/L — ABNORMAL LOW (ref 19–32)
CREATININE: 0.59 mg/dL (ref 0.50–1.10)
Calcium: 8.2 mg/dL — ABNORMAL LOW (ref 8.4–10.5)
Chloride: 114 mEq/L — ABNORMAL HIGH (ref 96–112)
GFR calc non Af Amer: 88 mL/min — ABNORMAL LOW (ref >90)
GLUCOSE: 142 mg/dL — AB (ref 70–99)
POTASSIUM: 3.6 mmol/L (ref 3.5–5.1)
Sodium: 140 mmol/L (ref 135–145)

## 2014-01-27 LAB — BLOOD GAS, ARTERIAL
Acid-base deficit: 4.1 mmol/L — ABNORMAL HIGH (ref 0.0–2.0)
Bicarbonate: 18.8 mEq/L — ABNORMAL LOW (ref 20.0–24.0)
Drawn by: 25203
O2 CONTENT: 2 L/min
O2 Saturation: 99.5 %
PH ART: 7.487 — AB (ref 7.350–7.450)
Patient temperature: 98.6
TCO2: 19.6 mmol/L (ref 0–100)
pCO2 arterial: 25.1 mmHg — ABNORMAL LOW (ref 35.0–45.0)
pO2, Arterial: 146 mmHg — ABNORMAL HIGH (ref 80.0–100.0)

## 2014-01-27 LAB — CBC
HEMATOCRIT: 25.9 % — AB (ref 36.0–46.0)
Hemoglobin: 8.1 g/dL — ABNORMAL LOW (ref 12.0–15.0)
MCH: 24 pg — ABNORMAL LOW (ref 26.0–34.0)
MCHC: 31.3 g/dL (ref 30.0–36.0)
MCV: 76.6 fL — AB (ref 78.0–100.0)
PLATELETS: 361 10*3/uL (ref 150–400)
RBC: 3.38 MIL/uL — ABNORMAL LOW (ref 3.87–5.11)
RDW: 16.8 % — AB (ref 11.5–15.5)
WBC: 10.5 10*3/uL (ref 4.0–10.5)

## 2014-01-27 LAB — MRSA PCR SCREENING: MRSA BY PCR: NEGATIVE

## 2014-01-27 MED ORDER — FUROSEMIDE 10 MG/ML IJ SOLN
40.0000 mg | Freq: Once | INTRAMUSCULAR | Status: AC
  Administered 2014-01-27: 40 mg via INTRAVENOUS
  Filled 2014-01-27: qty 4

## 2014-01-27 MED ORDER — LABETALOL HCL 5 MG/ML IV SOLN
5.0000 mg | INTRAVENOUS | Status: DC | PRN
  Filled 2014-01-27: qty 4

## 2014-01-27 MED ORDER — METOCLOPRAMIDE HCL 5 MG/ML IJ SOLN
10.0000 mg | Freq: Three times a day (TID) | INTRAMUSCULAR | Status: AC
  Administered 2014-01-27 (×3): 10 mg via INTRAVENOUS
  Filled 2014-01-27 (×3): qty 2

## 2014-01-27 NOTE — Progress Notes (Addendum)
Patient ID: [name], female   DOB: [birth year], 74 y.o.   MRN: [id number] Patient has had increased RR tonight. Breathing treatment done without relief. CXR shows mild edema. Abd x-ray shows post-op ileus without free air. On exam, lungs are clear but RR 30. Abdomen is quite distended with some tenderness but no guarding. Wound with mild drainage. I suspect ileus/distended bowel is pushing up on diaphragm and impairing respiration. Will place NGT to LIWS and transfer to ICU for closer monitoring. Appreciate rapid response RN. Will see how she does. May consider CTA chest/PE study if does not improve. Georganna Skeans, MD, MPH, FACS Trauma: [phone] General Surgery: [phone]

## 2014-01-27 NOTE — Progress Notes (Signed)
Report called to RN on 3W. Transferred to 3W16 in bed, accompanied by RR team.

## 2014-01-27 NOTE — Progress Notes (Signed)
3 Days Post-Op  Subjective: She reports mild SOB this morning but overall feels better Abdominal pain is less No flatus yet  Objective: Vital signs in last 24 hours: Temp:  [98 F (36.7 C)-99.2 F (37.3 C)] 99.2 F (37.3 C) (01/08 0125) Pulse Rate:  [110-130] 115 (01/08 0416) Resp:  [16-40] 30 (01/08 0416) BP: (120-154)/(60-78) 120/60 mmHg (01/08 0416) SpO2:  [92 %-100 %] 95 % (01/08 0416) Last BM Date: 01/24/14  Intake/Output from previous day: 01/07 0701 - 01/08 0700 In: 2446.4 [P.O.:360; I.V.:2086.4] Out: 2350 [Urine:1350; Emesis/NG output:1000] Intake/Output this shift:    Lungs with coarse BS bilaterally Abdomen softer, much less tender  Lab Results:   Recent Labs  01/26/14 0715 01/27/14 0204  WBC 10.9* 10.5  HGB 8.3* 8.1*  HCT 26.9* 25.9*  PLT 335 361   BMET  Recent Labs  01/26/14 0715 01/27/14 0204  NA 139 140  K 4.2 3.6  CL 110 114*  CO2 22 16*  GLUCOSE 148* 142*  BUN 20 21  CREATININE 0.75 0.59  CALCIUM 8.9 8.2*   PT/INR No results for input(s): LABPROT, INR in the last 72 hours. ABG  Recent Labs  01/27/14 0221  PHART 7.487*  HCO3 18.8*    Studies/Results: Dg Chest Port 1 View  01/27/2014   CLINICAL DATA:  Respiratory failure.  EXAM: PORTABLE CHEST - 1 VIEW  COMPARISON:  10/20/2013  FINDINGS: Shallow inspiration. Mild cardiac enlargement and mild pulmonary vascular congestion. Suggestion of hazy perihilar infiltration possibly representing mild edema. Small left pleural effusion. No pneumothorax. Calcification of the aorta.  IMPRESSION: Cardiac enlargement with mild pulmonary vascular congestion and suggestion of perihilar edema. Small left pleural effusion.   Electronically Signed   By: Lucienne Capers M.D.   On: 01/27/2014 02:10   Dg Abd Portable 1v  01/27/2014   CLINICAL DATA:  Acute onset of abdominal pain and distention, status post colostomy takedown. Respiratory distress. Initial encounter.  EXAM: PORTABLE ABDOMEN - 1 VIEW   COMPARISON:  Abdominal radiograph performed 09/18/2013  FINDINGS: There is diffuse distention of small and large bowel loops within the abdomen, which may reflect postoperative ileus. The distal colon is not well assessed on this study, status post colostomy takedown. Distal obstruction cannot be entirely excluded. Skin staples are seen along the midline abdomen and at the left lower quadrant. No definite free intra-abdominal air is identified on the concurrent chest radiograph.  No acute osseous abnormalities are seen.  Bibasilar opacities may reflect mild pulmonary edema. A small left pleural effusion is noted.  IMPRESSION: 1. Diffuse distention of small and large bowel loops within the abdomen, new from prior studies, which may reflect postoperative ileus. Status post colostomy takedown, the distal colon is not well assessed on this study. Distal obstruction cannot be entirely excluded. No free intra-abdominal air seen. 2. Bibasilar airspace opacities may reflect mild pulmonary edema. Small left pleural effusion noted.  These results were called by telephone at the time of interpretation on 01/27/2014 at 2:16 am to [facility] on [facility], who verbally acknowledged these results.   Electronically Signed   By: Garald Balding M.D.   On: 01/27/2014 02:17    Anti-infectives: Anti-infectives    Start     Dose/Rate Route Frequency Ordered Stop   01/24/14 0600  ceFAZolin (ANCEF) IVPB 2 g/50 mL premix     2 g100 mL/hr over 30 Minutes Intravenous On call to O.R. 01/23/14 1419 01/24/14 1321   01/24/14 0600  metroNIDAZOLE (FLAGYL) IVPB 500 mg  500 mg100 mL/hr over 60 Minutes Intravenous On call to O.R. 01/23/14 1419 01/25/14 0559      Assessment/Plan: s/p Procedure(s): COLOSTOMY TAKEDOWN (N/A)  Because of tachycardia, will transfer to stepdown unit.  May be because of her beta blocker not having affect Overall, her labs are better Post op ileus Will also give lasix and will leave foley for strict I's and  O's  LOS: 3 days    Nakaila Freeze A 01/27/2014

## 2014-01-27 NOTE — Care Management Note (Signed)
  Page 2 of 2   02/03/2014     11:01:20 AM CARE MANAGEMENT NOTE 02/03/2014  Patient:  [name], [name]   Account Number:  [id number]  Date Initiated:  01/25/2014  Documentation initiated by:  Sandi Mariscal  Subjective/Objective Assessment:   Scheduled colostomy takedown     Action/Plan:   home w/ no anticipated needs when bowel function returns   Anticipated DC Date:  02/03/2014   Anticipated DC Plan:  [place]         Choice offered to / List presented to:  [name]        [place] arranged  HH-1 RN  [place] [facility]   Status of service:  Completed, signed off Medicare Important Message given?  YES (If response is "NO", the following Medicare IM given date fields will be blank) Date Medicare IM given:  01/27/2014 Medicare IM given by:  Magdalen Spatz Date Additional Medicare IM given:  02/02/2014 Additional Medicare IM given by:  Magdalen Spatz  Discharge Disposition:  [place]  Per UR Regulation:  Reviewed for med. necessity/level of care/duration of stay  If discussed at [place] of Stay Meetings, dates discussed:   02/02/2014    Comments:  ContactRidley, Dileo Spouse [phone]  [phone]    [place] Daughter [phone]  [phone]   Josey,Debbie Daughter [phone]   02-03-14 Discharge summary faxed to [facility] , called same they are aware patient discahrged to home today. Magdalen Spatz RN BSN  02-02-14 Confirmed face sheet information with patient and family .  Patient has had [place] of [facility] phone [phone] , fax [phone]    Magdalen Spatz RN BSN   02-02-14 Natividad Brood with [facility] aware of [facility] consult and following patient. Magdalen Spatz RN BSN    01-30-14 3:40pm Luz Lex, RNBSN [phone] Resp distress with drop of hbg and temp - tx to ICU on 1-9 and intubated. Extubated today.

## 2014-01-27 NOTE — Significant Event (Addendum)
Rapid Response Event Note Called to see pt for increased RR Overview: Time Called: 0142 Arrival Time: 0145 Event Type: Respiratory  Initial Focused Assessment: On arrival to floor to see her, she was supine in the bed and moaning in pain.  Pt unable to describe pain but rubs her hand across her upper abd/lower chest.  She is extremely tender to palpation and distended.  RR 36, sats 94% on 3L.  RT at bedside to obtain [facility].  Spoke with Dr. Grandville Silos and new orders obtained.  Interventions: PCXR CBC, CMP, lactic acid, ABG  Event Summary: Name of Physician Notified: Dr. Grandville Silos at 0200    at    NGT placed with nearly 1000cc of dark gastric content obtained.  Pt states she feels some better.  RR still in 58s.  [name]

## 2014-01-27 NOTE — Progress Notes (Signed)
Per Dr. Grandville Silos, will cancel transfer to ICU.

## 2014-01-27 NOTE — Progress Notes (Signed)
NGT in place to LIWS. Approximately 800cc dark brown fluid returned. Patient is now resting comfortably. Will continue to monitor.

## 2014-01-28 ENCOUNTER — Inpatient Hospital Stay (HOSPITAL_COMMUNITY): Payer: Commercial Managed Care - HMO

## 2014-01-28 DIAGNOSIS — J9600 Acute respiratory failure, unspecified whether with hypoxia or hypercapnia: Secondary | ICD-10-CM | POA: Insufficient documentation

## 2014-01-28 DIAGNOSIS — J9601 Acute respiratory failure with hypoxia: Secondary | ICD-10-CM

## 2014-01-28 DIAGNOSIS — J9611 Chronic respiratory failure with hypoxia: Secondary | ICD-10-CM | POA: Insufficient documentation

## 2014-01-28 LAB — BASIC METABOLIC PANEL
Anion gap: 6 (ref 5–15)
Anion gap: 7 (ref 5–15)
BUN: 13 mg/dL (ref 6–23)
BUN: 10 mg/dL (ref 6–23)
CALCIUM: 7.7 mg/dL — AB (ref 8.4–10.5)
CALCIUM: 7.6 mg/dL — AB (ref 8.4–10.5)
CO2: 24 mmol/L (ref 19–32)
CO2: 26 mmol/L (ref 19–32)
CREATININE: 0.6 mg/dL (ref 0.50–1.10)
Chloride: 113 mEq/L — ABNORMAL HIGH (ref 96–112)
Chloride: 107 mEq/L (ref 96–112)
Creatinine, Ser: 0.59 mg/dL (ref 0.50–1.10)
GFR calc Af Amer: >90 mL/min (ref >90)
GFR calc non Af Amer: 88 mL/min — ABNORMAL LOW (ref >90)
GFR calc non Af Amer: 88 mL/min — ABNORMAL LOW (ref >90)
Glucose, Bld: 130 mg/dL — ABNORMAL HIGH (ref 70–99)
Glucose, Bld: 132 mg/dL — ABNORMAL HIGH (ref 70–99)
Potassium: 3 mmol/L — ABNORMAL LOW (ref 3.5–5.1)
Potassium: 3.2 mmol/L — ABNORMAL LOW (ref 3.5–5.1)
SODIUM: 140 mmol/L (ref 135–145)
Sodium: 143 mmol/L (ref 135–145)

## 2014-01-28 LAB — TROPONIN I
Troponin I: <0.03 ng/mL (ref <0.031)
Troponin I: <0.03 ng/mL (ref <0.031)

## 2014-01-28 LAB — CBC
HCT: 21.5 % — ABNORMAL LOW (ref 36.0–46.0)
Hemoglobin: 6.7 g/dL — CL (ref 12.0–15.0)
MCH: 24.2 pg — ABNORMAL LOW (ref 26.0–34.0)
MCHC: 31.2 g/dL (ref 30.0–36.0)
MCV: 77.6 fL — ABNORMAL LOW (ref 78.0–100.0)
Platelets: 269 10*3/uL (ref 150–400)
RBC: 2.77 MIL/uL — ABNORMAL LOW (ref 3.87–5.11)
RDW: 17 % — AB (ref 11.5–15.5)
WBC: 7.7 10*3/uL (ref 4.0–10.5)

## 2014-01-28 LAB — POCT I-STAT 3, ART BLOOD GAS (G3+)
Bicarbonate: 25.4 mEq/L — ABNORMAL HIGH (ref 20.0–24.0)
O2 Saturation: 100 %
PCO2 ART: 41.3 mmHg (ref 35.0–45.0)
PO2 ART: 355 mmHg — AB (ref 80.0–100.0)
TCO2: 27 mmol/L (ref 0–100)
pH, Arterial: 7.397 (ref 7.350–7.450)

## 2014-01-28 LAB — PREPARE RBC (CROSSMATCH)

## 2014-01-28 LAB — GLUCOSE, CAPILLARY
GLUCOSE-CAPILLARY: 119 mg/dL — AB (ref 70–99)
GLUCOSE-CAPILLARY: 116 mg/dL — AB (ref 70–99)
Glucose-Capillary: 120 mg/dL — ABNORMAL HIGH (ref 70–99)
Glucose-Capillary: 128 mg/dL — ABNORMAL HIGH (ref 70–99)

## 2014-01-28 LAB — HEMOGLOBIN AND HEMATOCRIT, BLOOD
HEMATOCRIT: 22.4 % — AB (ref 36.0–46.0)
HEMATOCRIT: 27.4 % — AB (ref 36.0–46.0)
HEMOGLOBIN: 8.8 g/dL — AB (ref 12.0–15.0)
Hemoglobin: 7.1 g/dL — ABNORMAL LOW (ref 12.0–15.0)

## 2014-01-28 LAB — PROCALCITONIN: Procalcitonin: 0.61 ng/mL

## 2014-01-28 MED ORDER — FENTANYL CITRATE 0.05 MG/ML IJ SOLN
INTRAMUSCULAR | Status: AC
  Administered 2014-01-28: 100 ug via INTRAVENOUS
  Filled 2014-01-28: qty 2

## 2014-01-28 MED ORDER — ETOMIDATE 2 MG/ML IV SOLN
20.0000 mg | Freq: Once | INTRAVENOUS | Status: AC
  Administered 2014-01-28: 20 mg via INTRAVENOUS

## 2014-01-28 MED ORDER — FENTANYL CITRATE 0.05 MG/ML IJ SOLN: 50.0000 ug | INTRAMUSCULAR | Status: DC | PRN

## 2014-01-28 MED ORDER — NALOXONE HCL 0.4 MG/ML IJ SOLN: 0.4000 mg | INTRAMUSCULAR | Status: DC | PRN

## 2014-01-28 MED ORDER — POTASSIUM CHLORIDE 10 MEQ/100ML IV SOLN
10.0000 meq | INTRAVENOUS | Status: AC
  Administered 2014-01-28 (×6): 10 meq via INTRAVENOUS
  Filled 2014-01-28 (×4): qty 100

## 2014-01-28 MED ORDER — DIPHENHYDRAMINE HCL 50 MG/ML IJ SOLN: 12.5000 mg | Freq: Four times a day (QID) | INTRAMUSCULAR | Status: DC | PRN

## 2014-01-28 MED ORDER — SODIUM CHLORIDE 0.9 % IV SOLN: Freq: Once | INTRAVENOUS | Status: DC

## 2014-01-28 MED ORDER — CETYLPYRIDINIUM CHLORIDE 0.05 % MT LIQD: 7.0000 mL | Freq: Four times a day (QID) | OROMUCOSAL | Status: DC

## 2014-01-28 MED ORDER — ONDANSETRON HCL 4 MG/2ML IJ SOLN: 4.0000 mg | Freq: Four times a day (QID) | INTRAMUSCULAR | Status: DC | PRN

## 2014-01-28 MED ORDER — DIPHENHYDRAMINE HCL 12.5 MG/5ML PO ELIX: 12.5000 mg | ORAL_SOLUTION | Freq: Four times a day (QID) | ORAL | Status: DC | PRN

## 2014-01-28 MED ORDER — FENTANYL CITRATE 0.05 MG/ML IJ SOLN
50.0000 ug | INTRAMUSCULAR | Status: AC | PRN
  Administered 2014-01-28 (×2): 50 ug via INTRAVENOUS
  Filled 2014-01-28 (×2): qty 2

## 2014-01-28 MED ORDER — PIPERACILLIN-TAZOBACTAM 3.375 G IVPB
3.3750 g | Freq: Three times a day (TID) | INTRAVENOUS | Status: DC
  Administered 2014-01-28 – 2014-02-02 (×15): 3.375 g via INTRAVENOUS
  Filled 2014-01-28 (×19): qty 50

## 2014-01-28 MED ORDER — MIDAZOLAM HCL 2 MG/2ML IJ SOLN
INTRAMUSCULAR | Status: AC
  Administered 2014-01-28: 2 mg via INTRAVENOUS
  Filled 2014-01-28: qty 2

## 2014-01-28 MED ORDER — METOPROLOL TARTRATE 1 MG/ML IV SOLN
5.0000 mg | Freq: Four times a day (QID) | INTRAVENOUS | Status: DC
  Administered 2014-01-28 – 2014-01-29 (×4): 5 mg via INTRAVENOUS
  Filled 2014-01-28 (×8): qty 5

## 2014-01-28 MED ORDER — VANCOMYCIN HCL 500 MG IV SOLR
500.0000 mg | Freq: Two times a day (BID) | INTRAVENOUS | Status: DC
  Administered 2014-01-29 – 2014-01-30 (×3): 500 mg via INTRAVENOUS
  Filled 2014-01-28 (×4): qty 500

## 2014-01-28 MED ORDER — VANCOMYCIN HCL IN DEXTROSE 1-5 GM/200ML-% IV SOLN
1000.0000 mg | Freq: Once | INTRAVENOUS | Status: AC
  Administered 2014-01-28: 1000 mg via INTRAVENOUS
  Filled 2014-01-28: qty 200

## 2014-01-28 MED ORDER — ROCURONIUM BROMIDE 50 MG/5ML IV SOLN
40.0000 mg | Freq: Once | INTRAVENOUS | Status: AC
  Administered 2014-01-28: 40 mg via INTRAVENOUS

## 2014-01-28 MED ORDER — CHLORHEXIDINE GLUCONATE 0.12 % MT SOLN: 15.0000 mL | Freq: Two times a day (BID) | OROMUCOSAL | Status: DC

## 2014-01-28 MED ORDER — MORPHINE SULFATE (PF) 1 MG/ML IV SOLN: INTRAVENOUS | Status: DC

## 2014-01-28 MED ORDER — FENTANYL CITRATE 0.05 MG/ML IJ SOLN
25.0000 ug | INTRAMUSCULAR | Status: DC | PRN
  Administered 2014-01-28 (×4): 100 ug via INTRAVENOUS
  Administered 2014-01-29: 50 ug via INTRAVENOUS
  Administered 2014-01-29: 100 ug via INTRAVENOUS
  Administered 2014-01-29: 50 ug via INTRAVENOUS
  Administered 2014-01-30: 100 ug via INTRAVENOUS
  Administered 2014-01-30: 50 ug via INTRAVENOUS
  Administered 2014-01-30: 100 ug via INTRAVENOUS
  Filled 2014-01-28 (×11): qty 2

## 2014-01-28 MED ORDER — MIDAZOLAM HCL 2 MG/2ML IJ SOLN
2.0000 mg | Freq: Once | INTRAMUSCULAR | Status: AC
  Administered 2014-01-28: 2 mg via INTRAVENOUS

## 2014-01-28 MED ORDER — CHLORHEXIDINE GLUCONATE 0.12 % MT SOLN
15.0000 mL | Freq: Two times a day (BID) | OROMUCOSAL | Status: DC
Start: 2014-01-28 — End: 2014-01-28

## 2014-01-28 MED ORDER — POTASSIUM CHLORIDE 10 MEQ/100ML IV SOLN
10.0000 meq | INTRAVENOUS | Status: AC
  Administered 2014-01-28: 10 meq via INTRAVENOUS
  Filled 2014-01-28 (×2): qty 100

## 2014-01-28 MED ORDER — CETYLPYRIDINIUM CHLORIDE 0.05 % MT LIQD
7.0000 mL | Freq: Four times a day (QID) | OROMUCOSAL | Status: DC
  Administered 2014-01-29 – 2014-01-31 (×9): 7 mL via OROMUCOSAL

## 2014-01-28 MED ORDER — POTASSIUM CHLORIDE 10 MEQ/100ML IV SOLN
10.0000 meq | INTRAVENOUS | Status: AC
  Administered 2014-01-28 – 2014-01-29 (×4): 10 meq via INTRAVENOUS
  Filled 2014-01-28 (×4): qty 100

## 2014-01-28 MED ORDER — POTASSIUM CHLORIDE 20 MEQ/15ML (10%) PO SOLN
40.0000 meq | Freq: Once | ORAL | Status: DC
  Filled 2014-01-28: qty 30

## 2014-01-28 MED ORDER — FENTANYL CITRATE 0.05 MG/ML IJ SOLN
100.0000 ug | Freq: Once | INTRAMUSCULAR | Status: AC
  Administered 2014-01-28: 100 ug via INTRAVENOUS

## 2014-01-28 MED ORDER — CHLORHEXIDINE GLUCONATE 0.12 % MT SOLN
15.0000 mL | Freq: Two times a day (BID) | OROMUCOSAL | Status: DC
  Administered 2014-01-28 – 2014-01-30 (×5): 15 mL via OROMUCOSAL
  Filled 2014-01-28 (×5): qty 15

## 2014-01-28 MED ORDER — SODIUM CHLORIDE 0.9 % IV SOLN
Freq: Once | INTRAVENOUS | Status: AC
  Administered 2014-01-28: 11:00:00 via INTRAVENOUS

## 2014-01-28 MED ORDER — PIPERACILLIN-TAZOBACTAM 3.375 G IVPB 30 MIN
3.3750 g | Freq: Once | INTRAVENOUS | Status: AC
  Administered 2014-01-28: 3.375 g via INTRAVENOUS
  Filled 2014-01-28: qty 50

## 2014-01-28 MED ORDER — SODIUM CHLORIDE 0.9 % IJ SOLN: 9.0000 mL | INTRAMUSCULAR | Status: DC | PRN

## 2014-01-28 MED ORDER — DEXMEDETOMIDINE HCL IN NACL 200 MCG/50ML IV SOLN
0.0000 ug/kg/h | INTRAVENOUS | Status: DC
  Administered 2014-01-28: 0.3 ug/kg/h via INTRAVENOUS
  Administered 2014-01-29: 0.2 ug/kg/h via INTRAVENOUS
  Filled 2014-01-28 (×2): qty 50

## 2014-01-28 MED ORDER — FUROSEMIDE 10 MG/ML IJ SOLN
40.0000 mg | Freq: Once | INTRAMUSCULAR | Status: AC
  Administered 2014-01-28: 40 mg via INTRAVENOUS
  Filled 2014-01-28: qty 4

## 2014-01-28 NOTE — Progress Notes (Signed)
Sputum sampled obtained and sent down to main lab without any complications.

## 2014-01-28 NOTE — Procedures (Signed)
Central Venous Catheter Insertion Procedure Note [name] [id number] July 04, 1940  Procedure: Insertion of Central Venous Catheter Indications: Assessment of intravascular volume, Drug and/or fluid administration and Frequent blood sampling  Procedure Details Consent: Risks of procedure as well as the alternatives and risks of each were explained to the (patient/caregiver).  Consent for procedure obtained. Time Out: Verified patient identification, verified procedure, site/side was marked, verified correct patient position, special equipment/implants available, medications/allergies/relevent history reviewed, required imaging and test results available.  Performed Real time [place] used to ID and cannulate the vessel  Maximum sterile technique was used including antiseptics, cap, gloves, gown, hand hygiene, mask and sheet. Skin prep: Chlorhexidine; local anesthetic administered A antimicrobial bonded/coated triple lumen catheter was placed in the right internal jugular vein using the Seldinger technique.  Evaluation Blood flow good Complications: No apparent complications Patient did tolerate procedure well. Chest X-ray ordered to verify placement.  CXR: pending.  BABCOCK,PETE 01/28/2014, 10:43 AM  I was present for and supervised the entire procedure  Merton Border, MD ; [facility] [phone].  After 5:30 PM or weekends, call [phone]

## 2014-01-28 NOTE — Progress Notes (Signed)
ANTIBIOTIC CONSULT NOTE - INITIAL  Pharmacy Consult for vancomycin, Zosyn Indication: rule out pneumonia  Allergies  Allergen Reactions  . Pregabalin Swelling    Tongue swelling  . Sulfonamide Derivatives Swelling    Childhood reaction    Patient Measurements: Height: 5\' 1"  (154.9 cm) Weight: 142 lb 6.7 oz (64.6 kg) IBW/kg (Calculated) : 47.8 Vital Signs: Temp: 99.3 F (37.4 C) (01/09 1215) Temp Source: Oral (01/09 1215) BP: 132/49 mmHg (01/09 1258) Pulse Rate: 92 (01/09 1258) Intake/Output from previous day: 01/08 0701 - 01/09 0700 In: 50 [I.V.:50] Out: 3600 [Urine:3200; Emesis/NG output:400] Intake/Output from this shift: Total I/O In: 515 [I.V.:80; Blood:335; IV Piggyback:100] Out: 425 [Urine:425]  Labs:  Recent Labs  01/26/14 0715 01/27/14 0204 01/28/14 0345 01/28/14 1259  WBC 10.9* 10.5 7.7  --   HGB 8.3* 8.1* 6.7* 7.1*  PLT 335 361 269  --   CREATININE 0.75 0.59 0.59  --    Estimated Creatinine Clearance: 53.1 mL/min (by C-G formula based on Cr of 0.59). No results for input(s): VANCOTROUGH, VANCOPEAK, VANCORANDOM, GENTTROUGH, GENTPEAK, GENTRANDOM, TOBRATROUGH, TOBRAPEAK, TOBRARND, AMIKACINPEAK, AMIKACINTROU, AMIKACIN in the last 72 hours.   Microbiology: Recent Results (from the past 720 hour(s))  MRSA PCR Screening     Status: None   Collection Time: 01/27/14 12:22 PM  Result Value Ref Range Status   MRSA by PCR NEGATIVE NEGATIVE Final    Comment:        The GeneXpert MRSA Assay (FDA approved for NASAL specimens only), is one component of a comprehensive MRSA colonization surveillance program. It is not intended to diagnose MRSA infection nor to guide or monitor treatment for MRSA infections.     Medical History: Past Medical History  Diagnosis Date  . Carotid artery disease     a. 60-70% bilat ICA stenosis by dopplers 05/2012.  Marland Kitchen Other and unspecified hyperlipidemia     takes Lipitor daily  . CAD (coronary artery disease)     a. Mod  dz 2010 initially mgd medically. b. 12/2012 - angina s/p PTCA/DES to mid-circumflex, PTCA/DES to first OM.   Marland Kitchen Barrett's esophagus   . SBO (small bowel obstruction)     a. Lysis of adhesions and ovarian cystectomy 04/2013 for sBO. b. Admitted 07/2013 for colonic obstruction due to suspected colitis, s/p partial colectomy/colostomy 08/01/13. Admission complicated by anasarca, acute resp failure requiring tracheostomy, decannulated 08/25/13. c. Recurrent SBO8/2015, NGT placed for decompression.   . Colonic obstruction due to suspected colitis; s/p colectomy/colostomy     a. See SBO.  Marland Kitchen Protein-calorie malnutrition, severe w/ electrolyte imbalance     a. Severe hypoalbuminemia leading to 3rd spacing including anasarca and pulm edema 07/2013.  Marland Kitchen DVT of axillary vein, acute right     a. Dx 07/2013 felt due to R IJ central line that had been inserted 7/14. Not on anticoag due to GIB issues and small cerebral bleed.  . Acute respiratory failure with hypoxia s/p tracheostomy 07/2013  . Intracranial hemorrhage     a. 08/11/13 - per DC summary, tiny SAH vs SDH done for altered mentation, anticoag stopped including aspirin.  . Anemia     a. 7-08/2013 felt due to recent critical illness/chronic disease.  . Complication of anesthesia     hard to wake up from anesthesia   . Hypertension     takes Metoprolol daily  . HTN (hypertension)   . Heart murmur   . COPD (chronic obstructive pulmonary disease)   . Pneumonia     hx  of->15 yrs ago  . History of bronchitis     > 87yrs ago   . Dizziness     was on Bentyl which caused this-took off of it and no problems since  . Stroke early 80's    right sided weakness--TIA  . Arthritis   . Joint pain   . Chronic back pain     deteriorating;DDD  . Neck pain     DDD  . History of hiatal hernia   . History of colon polyps   . History of kidney stones     was told it was stable but doesn't know for sure that she ever passed it  . Depression     takes Remeron and  Zoloft daily    Medications:  Anti-infectives    Start     Dose/Rate Route Frequency Ordered Stop   01/24/14 0600  ceFAZolin (ANCEF) IVPB 2 g/50 mL premix     2 g100 mL/hr over 30 Minutes Intravenous On call to O.R. 01/23/14 1419 01/24/14 1321   01/24/14 0600  metroNIDAZOLE (FLAGYL) IVPB 500 mg     500 mg100 mL/hr over 60 Minutes Intravenous On call to O.R. 01/23/14 1419 01/25/14 0559     Assessment: 74 year old female s/p colostomy takedown 01/25/14 and transferred 01/28/14 to ICU for acute respiratory distress, new fever, delirium, and hemoglobin drop to start empiric vancomycin and Zosyn for rule out HCAP.   Tm 101.8, wbc within normal limits on 1/8, last SCr 0.59, estimated CrCl~50-68mL/min.  Blood, urine, and respiratory cultures pending  Goal of Therapy:  Vancomycin trough level 15-20 mcg/ml  Plan:  Vancomycin 1g IV x1 then 500mg  IV every 12 hours.  Zosyn 3.375g IV x1 over 30 minutes, every 8 hours each dose over 4 hours.  Follow-up renal function, culture results, and clinical status.   Sloan Leiter, PharmD, BCPS Clinical Pharmacist [phone]  01/28/2014,2:38 PM

## 2014-01-28 NOTE — Significant Event (Signed)
Rapid Response Event Note  Overview: Time Called: 1025 Arrival Time: 0755 Event Type: Respiratory  Initial Focused Assessment:  Called by primary Rn to evaluate patient with low sats in the 70's and RR in the 40's.  Upon my arrival to patients room, RN at bedside.  Patient in bed with NRB sats now 98%, RR 40's, SBP 130's, HR 120's with increased WOB.  Patient restless and SOB. Patients abdominal incision leaking, patient cleaned and dressing applied by RN.  Patient complaining of pain, morphine given by RN.    Interventions:  RN attempted to obtain PIV-unsuccessful.  PAged MD and updated, transfer for order to ICU given   Event Summary: Patient transferred to 2 M, RN updated.     at         Kennedy Bucker

## 2014-01-28 NOTE — Progress Notes (Signed)
4 Days Post-Op  Subjective: Called urgently to see patient for tachypnea, tachycardia, shortness of breath Patient is agitated because of low sats, but indicates no significant abdominal pain Has been febrile up to 101.5 Hgb was 6.7 - transfusing 2 units PRBC Had a bowel movement last night, per her husband Has had some serous drainage from her midline incision this morning, per nursing.  Objective: Vital signs in last 24 hours: Temp:  [98.2 F (36.8 C)-101.8 F (38.8 C)] 101.5 F (38.6 C) (01/09 0635) Pulse Rate:  [115-126] 122 (01/09 0800) Resp:  [25-42] 34 (01/09 0800) BP: (117-139)/(40-91) 138/46 mmHg (01/09 0800) SpO2:  [86 %-100 %] 100 % (01/09 0800) Weight:  [149 lb 4 oz (67.7 kg)] 149 lb 4 oz (67.7 kg) (01/09 0341) Last BM Date: 01/24/14  Intake/Output from previous day: 01/08 0701 - 01/09 0700 In: 50 [I.V.:50] Out: 3600 [Urine:3200; Emesis/NG output:400] Intake/Output this shift:    General appearance: Agitated, tachypneic, tachycardic Resp: bilateral rhonchi Cardio: Tachycardic, regular rhythm GI: Incision clean, no discoloratin, minimal serous drainage between staples Soft, active bowel sounds, minimal tenderness  Lab Results:   Recent Labs  01/27/14 0204 01/28/14 0345  WBC 10.5 7.7  HGB 8.1* 6.7*  HCT 25.9* 21.5*  PLT 361 269   BMET  Recent Labs  01/27/14 0204 01/28/14 0345  NA 140 143  K 3.6 3.0*  CL 114* 113*  CO2 16* 24  GLUCOSE 142* 130*  BUN 21 13  CREATININE 0.59 0.59  CALCIUM 8.2* 7.7*   PT/INR No results for input(s): LABPROT, INR in the last 72 hours. ABG  Recent Labs  01/27/14 0221  PHART 7.487*  HCO3 18.8*    Studies/Results: Dg Chest Port 1 View  01/27/2014   CLINICAL DATA:  Respiratory failure.  EXAM: PORTABLE CHEST - 1 VIEW  COMPARISON:  10/20/2013  FINDINGS: Shallow inspiration. Mild cardiac enlargement and mild pulmonary vascular congestion. Suggestion of hazy perihilar infiltration possibly representing mild edema.  Small left pleural effusion. No pneumothorax. Calcification of the aorta.  IMPRESSION: Cardiac enlargement with mild pulmonary vascular congestion and suggestion of perihilar edema. Small left pleural effusion.   Electronically Signed   By: Lucienne Capers M.D.   On: 01/27/2014 02:10   Dg Abd Portable 1v  01/27/2014   CLINICAL DATA:  Acute onset of abdominal pain and distention, status post colostomy takedown. Respiratory distress. Initial encounter.  EXAM: PORTABLE ABDOMEN - 1 VIEW  COMPARISON:  Abdominal radiograph performed 09/18/2013  FINDINGS: There is diffuse distention of small and large bowel loops within the abdomen, which may reflect postoperative ileus. The distal colon is not well assessed on this study, status post colostomy takedown. Distal obstruction cannot be entirely excluded. Skin staples are seen along the midline abdomen and at the left lower quadrant. No definite free intra-abdominal air is identified on the concurrent chest radiograph.  No acute osseous abnormalities are seen.  Bibasilar opacities may reflect mild pulmonary edema. A small left pleural effusion is noted.  IMPRESSION: 1. Diffuse distention of small and large bowel loops within the abdomen, new from prior studies, which may reflect postoperative ileus. Status post colostomy takedown, the distal colon is not well assessed on this study. Distal obstruction cannot be entirely excluded. No free intra-abdominal air seen. 2. Bibasilar airspace opacities may reflect mild pulmonary edema. Small left pleural effusion noted.  These results were called by telephone at the time of interpretation on 01/27/2014 at 2:16 am to [facility] on [facility], who verbally acknowledged these results.  Electronically Signed   By: Garald Balding M.D.   On: 01/27/2014 02:17    Anti-infectives: Anti-infectives    Start     Dose/Rate Route Frequency Ordered Stop   01/24/14 0600  ceFAZolin (ANCEF) IVPB 2 g/50 mL premix     2 g100 mL/hr over 30 Minutes  Intravenous On call to O.R. 01/23/14 1419 01/24/14 1321   01/24/14 0600  metroNIDAZOLE (FLAGYL) IVPB 500 mg     500 mg100 mL/hr over 60 Minutes Intravenous On call to O.R. 01/23/14 1419 01/25/14 0559      Assessment/Plan: s/p Procedure(s): COLOSTOMY TAKEDOWN (N/A) Transfer to ICU  Seems to be primary cardiac or pulmonary issues - STAT CXR, Troponin, CCM has been consulted Normal WBC, benign abdomen examination - probably not surgical, but we will probably need to evaluate with CT scan if no improvement. Will probably need intubation, mechanical ventilation. Transfuse for acute blood loss anemia.   LOS: 4 days    Dora Clauss K. 01/28/2014

## 2014-01-28 NOTE — Procedures (Signed)
Arterial Catheter Insertion Procedure Note [name] [id number] [birth year]  Procedure: Insertion of Arterial Catheter  Indications: Blood pressure monitoring  Procedure Details Consent: Risks of procedure as well as the alternatives and risks of each were explained to the (patient/caregiver).  Consent for procedure obtained. Time Out: Verified patient identification, verified procedure, site/side was marked, verified correct patient position, special equipment/implants available, medications/allergies/relevent history reviewed, required imaging and test results available.  Performed  Maximum sterile technique was used including antiseptics, cap, gloves, gown, hand hygiene, mask and sheet. Skin prep: Chlorhexidine; local anesthetic administered 20 gauge catheter was inserted into right radial artery using the Seldinger technique.  Evaluation Blood flow good; BP tracing good. Complications: No apparent complications.   [name] 01/28/2014

## 2014-01-28 NOTE — Progress Notes (Signed)
CRITICAL VALUE ALERT  Critical value received:  hgb 6.7  Date of notification:  01/28/14  Time of notification:  0504  Critical value read back: yes  Nurse who received alert:  Renita Papa    MD notified (1st page):  Ralene Ok, MD   Time of first page: 0515  Responding MD:  Ralene Ok, MD  Time MD responded:  [phone]

## 2014-01-28 NOTE — Consult Note (Signed)
PULMONARY / CRITICAL CARE MEDICINE   Name: [name] MRN: [id number] DOB: [birth year]    ADMISSION DATE:  01/24/2014 CONSULTATION DATE:  1/9   REFERRING MD :  Georgette Dover   CHIEF COMPLAINT:   Acute hypoxic respiratory failure   INITIAL PRESENTATION:  74 year old female who was s/p Colostomy takedown on 1/6. Post-op course was unremarkable until 1/9 when she was emergently transferred to the ICU w/ acute respiratory distress, new fever, delirium and hgb drop from 8.1 to 6.7. PCCM was asked to assist w/ her care. Was emergently intubated shortly after arrival to the ICU.   STUDIES:  ECHO 1/9>>>   SIGNIFICANT EVENTS: 1/6: colostomy takedown, lysis of adhesion  1/9: transferred to ICU, acute respiratory distress; agitated; febrile 101.5; hgb dropped to 6.7    HISTORY OF PRESENT ILLNESS:   [name] with an extensive past history. She had an exploratory lap at [facility] in April for an acute abdomen. She apparently had extensive lysis of adhesions and prolonged intubation post op. Postoperatively, she reports never fully improving and was eventually found to have a colon obstruction at the splenic flexure. She underwent another exploration at [facility] in July of 2015 where she had to undergo a colon resection and colostomy for the obstruction which was non-malignant. She was also found to have dense adhesions and had to undergo a small bowel resection as well for multiple enterotomies. She had since recovered after a prolonged hospital coarse. She underwent Colostomy takedown on 1/6. Post op course was unremarkable until 1/9 when she developed new fever, acute respiratory failure and delirium. PCCM was asked to a/w care.   PAST MEDICAL HISTORY :   has a past medical history of Carotid artery disease; Other and unspecified hyperlipidemia; CAD (coronary artery disease); Barrett's esophagus; SBO (small bowel obstruction); Colonic obstruction due to suspected colitis; s/p  colectomy/colostomy; Protein-calorie malnutrition, severe w/ electrolyte imbalance; DVT of axillary vein, acute right; Acute respiratory failure with hypoxia s/p tracheostomy (07/2013); Intracranial hemorrhage; Anemia; Complication of anesthesia; Hypertension; HTN (hypertension); Heart murmur; COPD (chronic obstructive pulmonary disease); Pneumonia; History of bronchitis; Dizziness; Stroke (early 68's); Arthritis; Joint pain; Chronic back pain; Neck pain; History of hiatal hernia; History of colon polyps; History of kidney stones; and Depression.  has past surgical history that includes Appendectomy; Cholecystectomy; Colon resection; Coronary angioplasty with stent (12/20/2012); Rotator cuff repair (Left); Abdominal hysterectomy; Colon surgery; Foot surgery (Left); Hand surgery (Bilateral); laparotomy (N/A, 08/01/2013); Partial colectomy (N/A, 08/01/2013); Colostomy (N/A, 08/01/2013); Bowel resection (N/A, 08/01/2013); Tracheostomy; left heart catheterization with coronary angiogram (N/A, 12/20/2012); cataract surgery (Bilateral); Colostomy takedown (01/24/2014); and Colostomy takedown (N/A, 01/24/2014). Prior to Admission medications   Medication Sig Start Date End Date Taking? Authorizing Provider  aspirin EC 81 MG tablet Take 162 mg by mouth daily.   Yes Historical Provider, MD  atorvastatin (LIPITOR) 80 MG tablet Take 80 mg by mouth every evening.    Yes Historical Provider, MD  fish oil-omega-3 fatty acids 1000 MG capsule Take 1,000 mg by mouth daily.    Yes Historical Provider, MD  folic acid (FOLVITE) 782 MCG tablet Take 800 mcg by mouth daily.    Yes Historical Provider, MD  ipratropium-albuterol (DUONEB) 0.5-2.5 (3) MG/3ML SOLN Take 3 mLs by nebulization every 6 (six) hours as needed (for shortness of breath).   Yes Historical Provider, MD  metoprolol succinate (TOPROL-XL) 25 MG 24 hr tablet TAKE 1 TABLET BY MOUTH DAILY 12/27/13  Yes Dorothy Spark, MD  mirtazapine (REMERON)  15 MG tablet Take 15 mg by  mouth at bedtime.   Yes Historical Provider, MD  nitroGLYCERIN (NITROSTAT) 0.4 MG SL tablet Place 1 tablet (0.4 mg total) under the tongue every 5 (five) minutes as needed for chest pain. 12/13/12  Yes Dorothy Spark, MD  ondansetron (ZOFRAN) 4 MG tablet Take 4 mg by mouth every 6 (six) hours as needed for nausea or vomiting.    Yes Historical Provider, MD  oxyCODONE-acetaminophen (PERCOCET/ROXICET) 5-325 MG per tablet Take 1-2 tablets by mouth every 4 (four) hours as needed for moderate pain.  09/14/13  Yes Historical Provider, MD  potassium chloride SA (K-DUR,KLOR-CON) 20 MEQ tablet Take 20 mEq by mouth 3 (three) times daily.   Yes Historical Provider, MD  zaleplon (SONATA) 10 MG capsule Take 20 mg by mouth at bedtime.  09/14/13  Yes Historical Provider, MD   Allergies  Allergen Reactions  . Pregabalin Swelling    Tongue swelling  . Sulfonamide Derivatives Swelling    Childhood reaction    FAMILY HISTORY:  indicated that her mother is deceased. She indicated that her father is deceased. She indicated that her sister is alive. She indicated that both of her brothers are alive.  SOCIAL HISTORY:  reports that she has been smoking Cigarettes.  She has a 26.5 pack-year smoking history. She has never used smokeless tobacco. She reports that she does not drink alcohol or use illicit drugs.  REVIEW OF SYSTEMS:  Unable   SUBJECTIVE:   VITAL SIGNS: Temp:  [98.2 F (36.8 C)-101.8 F (38.8 C)] 99.3 F (37.4 C) (01/09 1215) Pulse Rate:  [92-122] 92 (01/09 1258) Resp:  [16-42] 26 (01/09 1258) BP: (91-153)/(40-91) 132/49 mmHg (01/09 1258) SpO2:  [86 %-100 %] 100 % (01/09 1258) FiO2 (%):  [1 %-100 %] 60 % (01/09 1258) Weight:  [64.6 kg (142 lb 6.7 oz)-67.7 kg (149 lb 4 oz)] 64.6 kg (142 lb 6.7 oz) (01/09 1230) HEMODYNAMICS:   VENTILATOR SETTINGS: Vent Mode:  [-] PRVC FiO2 (%):  [1 %-100 %] 60 % Set Rate:  [16 bmp] 16 bmp Vt Set:  [370 mL] 370 mL PEEP:  [8 cmH20] 8 cmH20 Plateau  Pressure:  [13 cmH20-21 cmH20] 13 cmH20 INTAKE / OUTPUT:  Intake/Output Summary (Last 24 hours) at 01/28/14 1407 Last data filed at 01/28/14 1206  Gross per 24 hour  Intake    565 ml  Output   1325 ml  Net   -760 ml    PHYSICAL EXAMINATION: General:  Acutely ill appearing. Now sedated on vent  Neuro:  Oriented x 2 prior to intubation  HEENT:  Orally intubated  Cardiovascular:  rrr Lungs:  Diffuse rales, tactile frem  Abdomen:  Dressing CD&I Musculoskeletal:  Intact  Skin:  Intact   LABS:  CBC  Recent Labs Lab 01/26/14 0715 01/27/14 0204 01/28/14 0345 01/28/14 1259  WBC 10.9* 10.5 7.7  --   HGB 8.3* 8.1* 6.7* 7.1*  HCT 26.9* 25.9* 21.5* 22.4*  PLT 335 361 269  --    Coag's No results for input(s): APTT, INR in the last 168 hours. BMET  Recent Labs Lab 01/26/14 0715 01/27/14 0204 01/28/14 0345  NA 139 140 143  K 4.2 3.6 3.0*  CL 110 114* 113*  CO2 22 16* 24  BUN 20 21 13   CREATININE 0.75 0.59 0.59  GLUCOSE 148* 142* 130*   Electrolytes  Recent Labs Lab 01/26/14 0715 01/27/14 0204 01/28/14 0345  CALCIUM 8.9 8.2* 7.7*   Sepsis Markers  Recent  Labs Lab 01/27/14 0204  LATICACIDVEN 1.1   ABG  Recent Labs Lab 01/27/14 0221 01/28/14 1251  PHART 7.487* 7.397  PCO2ART 25.1* 41.3  PO2ART 146.0* 355.0*   Liver Enzymes No results for input(s): AST, ALT, ALKPHOS, BILITOT, ALBUMIN in the last 168 hours. Cardiac Enzymes  Recent Labs Lab 01/28/14 0852  TROPONINI <0.03   Glucose  Recent Labs Lab 01/28/14 0915  GLUCAP 120*    Imaging Dg Chest Port 1 View  01/27/2014   CLINICAL DATA:  Respiratory failure.  EXAM: PORTABLE CHEST - 1 VIEW  COMPARISON:  10/20/2013  FINDINGS: Shallow inspiration. Mild cardiac enlargement and mild pulmonary vascular congestion. Suggestion of hazy perihilar infiltration possibly representing mild edema. Small left pleural effusion. No pneumothorax. Calcification of the aorta.  IMPRESSION: Cardiac enlargement with  mild pulmonary vascular congestion and suggestion of perihilar edema. Small left pleural effusion.   Electronically Signed   By: Lucienne Capers M.D.   On: 01/27/2014 02:10   Dg Abd Portable 1v  01/27/2014   CLINICAL DATA:  Acute onset of abdominal pain and distention, status post colostomy takedown. Respiratory distress. Initial encounter.  EXAM: PORTABLE ABDOMEN - 1 VIEW  COMPARISON:  Abdominal radiograph performed 09/18/2013  FINDINGS: There is diffuse distention of small and large bowel loops within the abdomen, which may reflect postoperative ileus. The distal colon is not well assessed on this study, status post colostomy takedown. Distal obstruction cannot be entirely excluded. Skin staples are seen along the midline abdomen and at the left lower quadrant. No definite free intra-abdominal air is identified on the concurrent chest radiograph.  No acute osseous abnormalities are seen.  Bibasilar opacities may reflect mild pulmonary edema. A small left pleural effusion is noted.  IMPRESSION: 1. Diffuse distention of small and large bowel loops within the abdomen, new from prior studies, which may reflect postoperative ileus. Status post colostomy takedown, the distal colon is not well assessed on this study. Distal obstruction cannot be entirely excluded. No free intra-abdominal air seen. 2. Bibasilar airspace opacities may reflect mild pulmonary edema. Small left pleural effusion noted.  These results were called by telephone at the time of interpretation on 01/27/2014 at 2:16 am to [facility] on [facility], who verbally acknowledged these results.   Electronically Signed   By: Garald Balding M.D.   On: 01/27/2014 02:17     ASSESSMENT / PLAN:  PULMONARY OETT 1/9>>> A: Acute hypoxic respiratory failure in setting of bilateral pulmonary infiltrates. Favor acute pulmonary edema but ALI is a possibility as CVP estimated currently < 10 P:   Full vent support Send sputum  PAD protocol Lasix X 1 Hope to wean  1/10   CARDIOVASCULAR CVL right IJ CVL 1/9>>> A:  H/o CAD HTN  prob pulmonary edema   P:  KVO IVFs Scheduled BB IV lasix X1   RENAL A:  Hypokalemia   P:   Replace and recheck  GASTROINTESTINAL A:   S/p colostomy takedown P:   SUP w/ PPI  Diet to adv per surg   HEMATOLOGIC A:   Post-op anemia. No evidence of acute blood loss.  P:  Trend cbc Hold LMWH Add SCDs  INFECTIOUS A:  Fever, sirs. R/o sepsis  P:   BCx2 1/9>>> UC 1/9>>> Sputum 1/9>>> P Ck PCT Start empiric abx vanc 1/9>>> Zosyn 1/9>>> May need CT abd/pelvis although abd exam not impressive  ENDOCRINE A:  No acute  P:   Trend glucose   NEUROLOGIC A:   Acute encephalopathy in  setting of hypoxia  P:   RASS goal: -2 PAD protocol  Add precedex   FAMILY  - Updates updated her husband at bedside.     TODAY'S NP SUMMARY:  74 year old female who was s/p Colostomy takedown on 1/6. Post-op course was unremarkable until 1/9 when she was emergently transferred to the ICU w/ acute respiratory distress, new fever, delirium and hgb drop from 8.1 to 6.7. PCCM was asked to assist w/ her care. Was emergently intubated shortly after arrival to the ICU. Would favor this is all pulmonary edema in the setting of known CM, likely exacerbated by post-op anemia, however her initial CVP is less than 10 so infiltrates could reflect ALI or possibly HCAP, given fever (but doubt). Her abd exam is not impressive. Will cont full vent support, transfuse to get hgb > 8 given CAD, add lasix and scheduled BB. Have added empiric abx coverage and ordered PCTs. Will get sputum. Low threshold to stop abx. If clinically she worsens we will need to consider imaging of the abd to f/o abd infection or anastomosis leak. She is critically ill.      Merton Border, MD ; [facility] [phone].  After 5:30 PM or weekends, call [phone]  Pulmonary and [place] Pager: [phone]  01/28/2014, 2:07 PM

## 2014-01-28 NOTE — Progress Notes (Signed)
Noted pt with increased WOB, agitated and restless ; sats in 80's on 6 lpm , nasal cannula , then dropped to  70's lowest 76. Switched to NRB, sats high 90"s. Abdominal incision w/ staples open to air found be oozing with moderate amount of serosanguinous wetting gown. Cleaned and ABD pad applied and secured.Marland Kitchen RRT called for assessment.

## 2014-01-29 ENCOUNTER — Inpatient Hospital Stay (HOSPITAL_COMMUNITY): Payer: Commercial Managed Care - HMO

## 2014-01-29 DIAGNOSIS — Z9889 Other specified postprocedural states: Secondary | ICD-10-CM

## 2014-01-29 LAB — CBC
HCT: 25.4 % — ABNORMAL LOW (ref 36.0–46.0)
HCT: 25.1 % — ABNORMAL LOW (ref 36.0–46.0)
HEMOGLOBIN: 8.2 g/dL — AB (ref 12.0–15.0)
Hemoglobin: 8.1 g/dL — ABNORMAL LOW (ref 12.0–15.0)
MCH: 25.8 pg — ABNORMAL LOW (ref 26.0–34.0)
MCH: 25.6 pg — AB (ref 26.0–34.0)
MCHC: 32.3 g/dL (ref 30.0–36.0)
MCHC: 32.3 g/dL (ref 30.0–36.0)
MCV: 79.9 fL (ref 78.0–100.0)
MCV: 79.4 fL (ref 78.0–100.0)
PLATELETS: 211 10*3/uL (ref 150–400)
Platelets: 213 10*3/uL (ref 150–400)
RBC: 3.18 MIL/uL — ABNORMAL LOW (ref 3.87–5.11)
RBC: 3.16 MIL/uL — AB (ref 3.87–5.11)
RDW: 16.5 % — ABNORMAL HIGH (ref 11.5–15.5)
RDW: 16.9 % — AB (ref 11.5–15.5)
WBC: 7.6 10*3/uL (ref 4.0–10.5)
WBC: 8.9 10*3/uL (ref 4.0–10.5)

## 2014-01-29 LAB — URINALYSIS, ROUTINE W REFLEX MICROSCOPIC
Glucose, UA: NEGATIVE mg/dL
HGB URINE DIPSTICK: NEGATIVE
Ketones, ur: 15 mg/dL — AB
NITRITE: NEGATIVE
Protein, ur: NEGATIVE mg/dL
SPECIFIC GRAVITY, URINE: 1.036 — AB (ref 1.005–1.030)
Urobilinogen, UA: 0.2 mg/dL (ref 0.0–1.0)
pH: 5.5 (ref 5.0–8.0)

## 2014-01-29 LAB — BRAIN NATRIURETIC PEPTIDE: B Natriuretic Peptide: 219.9 pg/mL — ABNORMAL HIGH (ref 0.0–100.0)

## 2014-01-29 LAB — URINE MICROSCOPIC-ADD ON

## 2014-01-29 LAB — BASIC METABOLIC PANEL
ANION GAP: 7 (ref 5–15)
BUN: 13 mg/dL (ref 6–23)
CALCIUM: 7.6 mg/dL — AB (ref 8.4–10.5)
CHLORIDE: 111 meq/L (ref 96–112)
CO2: 25 mmol/L (ref 19–32)
Creatinine, Ser: 0.66 mg/dL (ref 0.50–1.10)
GFR calc Af Amer: >90 mL/min (ref >90)
GFR, EST NON AFRICAN AMERICAN: 85 mL/min — AB (ref >90)
Glucose, Bld: 101 mg/dL — ABNORMAL HIGH (ref 70–99)
Potassium: 3.5 mmol/L (ref 3.5–5.1)
Sodium: 143 mmol/L (ref 135–145)

## 2014-01-29 LAB — GLUCOSE, CAPILLARY
Glucose-Capillary: 104 mg/dL — ABNORMAL HIGH (ref 70–99)
Glucose-Capillary: 99 mg/dL (ref 70–99)

## 2014-01-29 LAB — PROCALCITONIN: PROCALCITONIN: 0.59 ng/mL

## 2014-01-29 MED ORDER — POTASSIUM CHLORIDE 10 MEQ/100ML IV SOLN
10.0000 meq | INTRAVENOUS | Status: AC
  Administered 2014-01-29 (×4): 10 meq via INTRAVENOUS
  Filled 2014-01-29 (×4): qty 100

## 2014-01-29 MED ORDER — LABETALOL HCL 5 MG/ML IV SOLN
5.0000 mg | INTRAVENOUS | Status: DC | PRN
  Filled 2014-01-29: qty 4

## 2014-01-29 MED ORDER — PROPOFOL 10 MG/ML IV EMUL
5.0000 ug/kg/min | INTRAVENOUS | Status: DC
  Administered 2014-01-29: 5 ug/kg/min via INTRAVENOUS
  Filled 2014-01-29: qty 100

## 2014-01-29 MED ORDER — SODIUM CHLORIDE 0.9 % IV BOLUS (SEPSIS)
250.0000 mL | Freq: Once | INTRAVENOUS | Status: AC
  Administered 2014-01-29: 250 mL via INTRAVENOUS

## 2014-01-29 NOTE — Progress Notes (Addendum)
PULMONARY / CRITICAL CARE MEDICINE   Name: [name] MRN: [id number] DOB: 1940/08/12    ADMISSION DATE:  01/24/2014 CONSULTATION DATE:  1/9   REFERRING MD :  Georgette Dover   CHIEF COMPLAINT:   Acute hypoxic respiratory failure   INITIAL PRESENTATION:  74 year old female hx CAD, dvt, copd, stroke, HTN, trach 07/2013 (not currently), arthritis who was s/p Colostomy takedown on 1/6. Post-op course was unremarkable until 1/9 when she was emergently transferred to the ICU w/ acute respiratory distress, new fever, delirium and hgb drop from 8.1 to 6.7. PCCM was asked to assist w/ her care. Was emergently intubated shortly after arrival to the ICU.   STUDIES:  ECHO 1/9>>>   SIGNIFICANT EVENTS: 1/6: colostomy takedown, lysis of adhesion  1/9: transferred to ICU, acute respiratory distress; agitated; febrile 101.5; hgb dropped to 6.7   REVIEW OF SYSTEMS:  Unable to get full ROS but denies any significant pain anywhere.   SUBJECTIVE:   VITAL SIGNS: Temp:  [98.5 F (36.9 C)-100.8 F (38.2 C)] 98.5 F (36.9 C) (01/10 0337) Pulse Rate:  [88-122] 88 (01/10 0700) Resp:  [16-34] 25 (01/10 0700) BP: (67-153)/(34-61) 79/45 mmHg (01/10 0700) SpO2:  [97 %-100 %] 100 % (01/10 0700) FiO2 (%):  [0.6 %-100 %] 40 % (01/10 0600) Weight:  [64.6 kg (142 lb 6.7 oz)-67.6 kg (149 lb 0.5 oz)] 67.6 kg (149 lb 0.5 oz) (01/10 0600) HEMODYNAMICS: CVP:  [7 mmHg-15 mmHg] 8 mmHg VENTILATOR SETTINGS: Vent Mode:  [-] PRVC FiO2 (%):  [0.6 %-100 %] 40 % Set Rate:  [16 bmp] 16 bmp Vt Set:  [370 mL] 370 mL PEEP:  [8 cmH20] 8 cmH20 Plateau Pressure:  [13 XFG18-29 cmH20] 13 cmH20 INTAKE / OUTPUT:  Intake/Output Summary (Last 24 hours) at 01/29/14 0733 Last data filed at 01/29/14 0700  Gross per 24 hour  Intake 2734.51 ml  Output   4015 ml  Net -1280.49 ml    PHYSICAL EXAMINATION: General:  Acutely ill appearing. Now sedated on vent  Neuro:  Sedated. Follows commands.  HEENT:  Orally intubated   Cardiovascular:  rrr Lungs:  Coarse breathing.  Abdomen:  Dressing CD&I Musculoskeletal:  Intact  Skin:  Intact, some rash noted on the thigh, denies itching.   LABS:  CBC  Recent Labs Lab 01/27/14 0204 01/28/14 0345 01/28/14 1259 01/28/14 1850 01/29/14 0431  WBC 10.5 7.7  --   --  7.6  HGB 8.1* 6.7* 7.1* 8.8* 8.2*  HCT 25.9* 21.5* 22.4* 27.4* 25.4*  PLT 361 269  --   --  213   Coag's No results for input(s): APTT, INR in the last 168 hours. BMET  Recent Labs Lab 01/28/14 0345 01/28/14 1850 01/29/14 0431  NA 143 140 143  K 3.0* 3.2* 3.5  CL 113* 107 111  CO2 24 26 25   BUN 13 10 13   CREATININE 0.59 0.60 0.66  GLUCOSE 130* 132* 101*   Electrolytes  Recent Labs Lab 01/28/14 0345 01/28/14 1850 01/29/14 0431  CALCIUM 7.7* 7.6* 7.6*   Sepsis Markers  Recent Labs Lab 01/27/14 0204 01/28/14 1526 01/29/14 0431  LATICACIDVEN 1.1  --   --   PROCALCITON  --  0.61 0.59   ABG  Recent Labs Lab 01/27/14 0221 01/28/14 1251  PHART 7.487* 7.397  PCO2ART 25.1* 41.3  PO2ART 146.0* 355.0*   Liver Enzymes No results for input(s): AST, ALT, ALKPHOS, BILITOT, ALBUMIN in the last 168 hours. Cardiac Enzymes  Recent Labs Lab 01/28/14 0852 01/28/14 1259  01/28/14 1850  TROPONINI <0.03 <0.03 <0.03   Glucose  Recent Labs Lab 01/28/14 0915 01/28/14 1133 01/28/14 1705 01/28/14 1942 01/28/14 2345 01/29/14 0335  GLUCAP 120* 128* 119* 116* 99 104*    Imaging Portable Chest Xray  01/28/2014   CLINICAL DATA:  Acute onset of respiratory failure. Initial encounter.  EXAM: PORTABLE CHEST - 1 VIEW  COMPARISON:  Chest radiograph performed 01/27/2014  FINDINGS: The patient's endotracheal tube is seen ending 2-3 cm above the carina. An enteric tube is noted extending below the diaphragm. A right IJ line is noted ending about the cavoatrial junction.  Lung expansion is mildly improved. Vascular congestion is noted, with bilateral airspace opacification. This may  reflect pulmonary edema or multifocal pneumonia. A small left pleural effusion is noted.  The cardiomediastinal silhouette is mildly enlarged. No acute osseous abnormalities are identified.  IMPRESSION: 1. Endotracheal tube seen ending 2-3 cm above the carina. 2. Vascular congestion and mild cardiomegaly, with bilateral airspace opacification. This may reflect pulmonary edema or multifocal pneumonia. The appearance is somewhat more suggestive of pneumonia. Small left pleural effusion noted.   Electronically Signed   By: Garald Balding M.D.   On: 01/28/2014 11:16     ASSESSMENT / PLAN:  PULMONARY OETT 1/9>>> A: Acute hypoxic respiratory failure in setting of bilateral pulmonary infiltrates. Could 2/2 to aspiration chemo pneumonitis vs secretions which cleared with suction. I don't think this is pulmonary edema. Improved today on cxr.   Wean vent to CPAP as tolerates. Now 40%/16RR,8PEEP. No lasix for now as this is less likely edema.  CARDIOVASCULAR CVL right IJ CVL 1/9>>>  A line 1/9 >>> Echo 07/2013 normal EF. No diastolic dysfunction. ECHO 1/9 pending. Hx of HTN - now normotensive  A:  H/o CAD Hypotension with hx of hypertension  P:  Gave 282mc bolus. BP now better. D/ced metoprolol. Will restart if BP goes up. No lasix today.  RENAL A:  Hypokalemia   P:   Replace and recheck  GASTROINTESTINAL A:   S/p colostomy takedown Post op ab tenderness P:   SUP w/ PPI  Surgery ok with advancing diet but has high NGT output. Will keep NGT to suction for now and keep NPO. Will consider CT abdomen for post op ab tenderness if she doesn't improve.  HEMATOLOGIC A:   Post-op anemia. No evidence of acute blood loss. Transfused 2 units 1/9 P:  Monitor CBC q12hours. tx goal <8 since has CAD.  Hold LMWH Cont scds.   INFECTIOUS A:  Fever, sirs. R/o sepsis   Not sure why UA was not done. I don't see order for urine culture. P:   BCx2 1/9>>> UC 1/9>>> Sputum 1/9>>>  Ordered UA.  ucx if UA positive. PCT 0.59 Cont empiric abx until as remains febrile overnight.  vanc 1/9>>> Zosyn 1/9>>> May need CT abd/pelvis if continues to appear septic although abd exam not impressive.  F/up surgery recs.  ENDOCRINE A:  No acute  P:   Trend glucose   NEUROLOGIC A:   Acute encephalopathy in setting of hypoxia - improving.  P:   Propofol + PRN fentanyl for agitation  . RASS goal -1.   FAMILY  - Updates updated her husband at bedside 01/28/14  Dellia Nims, M.D PGY-1 Internal Medicine Pager [phone]    PCCM ATTENDING: I have reviewed pt's initial presentation, consultants notes and hospital database in detail.  The above assessment and plan was formulated under my direction.   Merton Border, MD;  PCCM service;  Mobile [phone]

## 2014-01-29 NOTE — Progress Notes (Signed)
5 Days Post-Op  Subjective: Intubated. Awake.  Passing gas.  Objective: Vital signs in last 24 hours: Temp:  [98.5 F (36.9 C)-100.8 F (38.2 C)] 98.5 F (36.9 C) (01/10 0337) Pulse Rate:  [88-122] 88 (01/10 0700) Resp:  [16-34] 25 (01/10 0700) BP: (67-153)/(34-61) 79/45 mmHg (01/10 0700) SpO2:  [97 %-100 %] 100 % (01/10 0700) FiO2 (%):  [0.6 %-100 %] 40 % (01/10 0600) Weight:  [142 lb 6.7 oz (64.6 kg)-149 lb 0.5 oz (67.6 kg)] 149 lb 0.5 oz (67.6 kg) (01/10 0600) Last BM Date: 01/24/14  Intake/Output from previous day: 01/09 0701 - 01/10 0700 In: 2934.5 [I.V.:734.5; Blood:670; NG/GT:30; IV Piggyback:1500] Out: 6283 [Urine:3415; Emesis/NG output:600] Intake/Output this shift:    PE: General- In NAD Abdomen-soft, some drainage from upper portion of midline incision  Lab Results:   Recent Labs  01/28/14 0345  01/28/14 1850 01/29/14 0431  WBC 7.7  --   --  7.6  HGB 6.7*  < > 8.8* 8.2*  HCT 21.5*  < > 27.4* 25.4*  PLT 269  --   --  213  < > = values in this interval not displayed. BMET  Recent Labs  01/28/14 1850 01/29/14 0431  NA 140 143  K 3.2* 3.5  CL 107 111  CO2 26 25  GLUCOSE 132* 101*  BUN 10 13  CREATININE 0.60 0.66  CALCIUM 7.6* 7.6*   PT/INR No results for input(s): LABPROT, INR in the last 72 hours. Comprehensive Metabolic Panel:    Component Value Date/Time   NA 143 01/29/2014 0431   NA 140 01/28/2014 1850   K 3.5 01/29/2014 0431   K 3.2* 01/28/2014 1850   CL 111 01/29/2014 0431   CL 107 01/28/2014 1850   CO2 25 01/29/2014 0431   CO2 26 01/28/2014 1850   BUN 13 01/29/2014 0431   BUN 10 01/28/2014 1850   CREATININE 0.66 01/29/2014 0431   CREATININE 0.60 01/28/2014 1850   GLUCOSE 101* 01/29/2014 0431   GLUCOSE 132* 01/28/2014 1850   CALCIUM 7.6* 01/29/2014 0431   CALCIUM 7.6* 01/28/2014 1850   AST 15 11/10/2013 1435   AST 27 10/20/2013 1611   ALT 7 11/10/2013 1435   ALT 13 10/20/2013 1611   ALKPHOS 61 11/10/2013 1435   ALKPHOS 89  10/20/2013 1611   BILITOT 0.5 11/10/2013 1435   BILITOT <0.2* 10/20/2013 1611   PROT 5.8* 11/10/2013 1435   PROT 6.0 10/20/2013 1611   ALBUMIN 2.6* 11/10/2013 1435   ALBUMIN 2.6* 10/20/2013 1611     Studies/Results: Portable Chest Xray  01/29/2014   CLINICAL DATA:  Acute respiratory failure  EXAM: PORTABLE CHEST - 1 VIEW  COMPARISON:  01/28/2014  FINDINGS: Multifocal interstitial/airspace opacities are improved, suggesting improving interstitial edema rather than pneumonia. Persistent left basilar opacity, likely a combination of atelectasis and small left pleural effusion.  The heart is top-normal in size.  Endotracheal tube terminates 3.5 cm above the carina.  Enteric tube courses below the diaphragm.  IMPRESSION: Suspected improving mild interstitial edema.  Patchy left basilar opacity, likely atelectasis, with small left pleural effusion.  Endotracheal tube terminates 3.5 cm above the carina.   Electronically Signed   By: Julian Hy M.D.   On: 01/29/2014 07:42   Portable Chest Xray  01/28/2014   CLINICAL DATA:  Acute onset of respiratory failure. Initial encounter.  EXAM: PORTABLE CHEST - 1 VIEW  COMPARISON:  Chest radiograph performed 01/27/2014  FINDINGS: The patient's endotracheal tube is seen ending 2-3 cm above  the carina. An enteric tube is noted extending below the diaphragm. A right IJ line is noted ending about the cavoatrial junction.  Lung expansion is mildly improved. Vascular congestion is noted, with bilateral airspace opacification. This may reflect pulmonary edema or multifocal pneumonia. A small left pleural effusion is noted.  The cardiomediastinal silhouette is mildly enlarged. No acute osseous abnormalities are identified.  IMPRESSION: 1. Endotracheal tube seen ending 2-3 cm above the carina. 2. Vascular congestion and mild cardiomegaly, with bilateral airspace opacification. This may reflect pulmonary edema or multifocal pneumonia. The appearance is somewhat more  suggestive of pneumonia. Small left pleural effusion noted.   Electronically Signed   By: Garald Balding M.D.   On: 01/28/2014 11:16    Anti-infectives: Anti-infectives    Start     Dose/Rate Route Frequency Ordered Stop   01/29/14 0300  vancomycin (VANCOCIN) 500 mg in sodium chloride 0.9 % 100 mL IVPB     500 mg100 mL/hr over 60 Minutes Intravenous Every 12 hours 01/28/14 1446     01/28/14 2300  piperacillin-tazobactam (ZOSYN) IVPB 3.375 g     3.375 g12.5 mL/hr over 240 Minutes Intravenous Every 8 hours 01/28/14 1446     01/28/14 1500  piperacillin-tazobactam (ZOSYN) IVPB 3.375 g     3.375 g100 mL/hr over 30 Minutes Intravenous  Once 01/28/14 1446 01/28/14 1730   01/28/14 1500  vancomycin (VANCOCIN) IVPB 1000 mg/200 mL premix     1,000 mg200 mL/hr over 60 Minutes Intravenous  Once 01/28/14 1446 01/28/14 1811   01/24/14 0600  ceFAZolin (ANCEF) IVPB 2 g/50 mL premix     2 g100 mL/hr over 30 Minutes Intravenous On call to O.R. 01/23/14 1419 01/24/14 1321   01/24/14 0600  metroNIDAZOLE (FLAGYL) IVPB 500 mg     500 mg100 mL/hr over 60 Minutes Intravenous On call to O.R. 01/23/14 1419 01/25/14 0559      Assessment Active Problems:   S/P colostomy takedown 01/24/14   Acute respiratory failure 01/28/14-possible pneumonia; looks much better this AM   ABL anemia-hemoglobin up to 8.2 post transfusion    LOS: 5 days   Plan: Start po diet once extubated.   [name] 01/29/2014

## 2014-01-30 ENCOUNTER — Inpatient Hospital Stay (HOSPITAL_COMMUNITY): Payer: Commercial Managed Care - HMO

## 2014-01-30 DIAGNOSIS — I359 Nonrheumatic aortic valve disorder, unspecified: Secondary | ICD-10-CM

## 2014-01-30 LAB — CBC
HEMATOCRIT: 25 % — AB (ref 36.0–46.0)
HEMOGLOBIN: 7.8 g/dL — AB (ref 12.0–15.0)
MCH: 25.2 pg — ABNORMAL LOW (ref 26.0–34.0)
MCHC: 31.2 g/dL (ref 30.0–36.0)
MCV: 80.9 fL (ref 78.0–100.0)
Platelets: 207 10*3/uL (ref 150–400)
RBC: 3.09 MIL/uL — ABNORMAL LOW (ref 3.87–5.11)
RDW: 17.4 % — AB (ref 11.5–15.5)
WBC: 7.7 10*3/uL (ref 4.0–10.5)

## 2014-01-30 LAB — BASIC METABOLIC PANEL
ANION GAP: 10 (ref 5–15)
BUN: 15 mg/dL (ref 6–23)
CALCIUM: 7.7 mg/dL — AB (ref 8.4–10.5)
CO2: 21 mmol/L (ref 19–32)
CREATININE: 0.67 mg/dL (ref 0.50–1.10)
Chloride: 111 mEq/L (ref 96–112)
GFR calc non Af Amer: 84 mL/min — ABNORMAL LOW (ref >90)
Glucose, Bld: 61 mg/dL — ABNORMAL LOW (ref 70–99)
POTASSIUM: 3.4 mmol/L — AB (ref 3.5–5.1)
Sodium: 142 mmol/L (ref 135–145)

## 2014-01-30 LAB — PROCALCITONIN: PROCALCITONIN: 0.28 ng/mL

## 2014-01-30 MED ORDER — KCL IN DEXTROSE-NACL 40-5-0.45 MEQ/L-%-% IV SOLN
INTRAVENOUS | Status: DC
  Administered 2014-01-30: 13:00:00 via INTRAVENOUS
  Filled 2014-01-30 (×3): qty 1000

## 2014-01-30 MED ORDER — ALTEPLASE 2 MG IJ SOLR
2.0000 mg | Freq: Once | INTRAMUSCULAR | Status: AC
  Administered 2014-01-30: 2 mg
  Filled 2014-01-30 (×2): qty 2

## 2014-01-30 MED ORDER — SODIUM CHLORIDE 0.9 % IV SOLN
INTRAVENOUS | Status: DC
  Administered 2014-01-31: 21:00:00 via INTRAVENOUS
  Administered 2014-02-02: 20 mL/h via INTRAVENOUS

## 2014-01-30 MED ORDER — SODIUM CHLORIDE 0.9 % IJ SOLN
10.0000 mL | Freq: Two times a day (BID) | INTRAMUSCULAR | Status: DC
  Administered 2014-01-30 – 2014-02-02 (×5): 10 mL

## 2014-01-30 MED ORDER — FENTANYL CITRATE 0.05 MG/ML IJ SOLN
25.0000 ug | INTRAMUSCULAR | Status: DC | PRN
  Administered 2014-01-30: 50 ug via INTRAVENOUS
  Administered 2014-01-30: 100 ug via INTRAVENOUS
  Administered 2014-01-30 (×3): 50 ug via INTRAVENOUS
  Administered 2014-01-31 (×5): 100 ug via INTRAVENOUS
  Filled 2014-01-30 (×9): qty 2

## 2014-01-30 MED ORDER — SODIUM CHLORIDE 0.9 % IJ SOLN: 10.0000 mL | INTRAMUSCULAR | Status: DC | PRN

## 2014-01-30 MED ORDER — KETOROLAC TROMETHAMINE 15 MG/ML IJ SOLN
15.0000 mg | Freq: Four times a day (QID) | INTRAMUSCULAR | Status: DC | PRN
  Administered 2014-01-30 – 2014-02-02 (×7): 15 mg via INTRAVENOUS
  Filled 2014-01-30 (×8): qty 1

## 2014-01-30 NOTE — Progress Notes (Signed)
PULMONARY / CRITICAL CARE MEDICINE   Name: [name] MRN: [id number] DOB: February 25, 1940    ADMISSION DATE:  01/24/2014 CONSULTATION DATE:  1/9   REFERRING MD :  Georgette Dover   CHIEF COMPLAINT:   Acute hypoxic respiratory failure   INITIAL PRESENTATION:  74 year old female hx CAD, dvt, copd, stroke, HTN, trach 07/2013 (not currently), arthritis who was s/p Colostomy takedown on 1/6. Post-op course was unremarkable until 1/9 when she was emergently transferred to the ICU w/ acute respiratory distress, new fever, delirium and hgb drop from 8.1 to 6.7. PCCM was asked to assist w/ her care. Was emergently intubated shortly after arrival to the ICU.   STUDIES: 1/11 TTE:    SIGNIFICANT EVENTS: 1/6 colostomy takedown, lysis of adhesion  1/9 transferred to ICU with respiratory distress and bilateral pulmonary infiltrates; fever; hgb dropped to 6.7 . Transfused 2 units PRBCs 1/11 CXR improved. Passed SBT. Cognition intact. Extubated   SUBJECTIVE:  RASS 0. + F/C. Extubated and comfortable   VITAL SIGNS: Temp:  [97.7 F (36.5 C)-98.3 F (36.8 C)] 97.7 F (36.5 C) (01/11 1139) Pulse Rate:  [72-106] 94 (01/11 1400) Resp:  [19-33] 25 (01/11 1400) BP: (56-119)/(40-83) 112/83 mmHg (01/11 1400) SpO2:  [98 %-100 %] 100 % (01/11 1400) FiO2 (%):  [40 %] 40 % (01/11 0850) Weight:  [64.9 kg (143 lb 1.3 oz)] 64.9 kg (143 lb 1.3 oz) (01/11 0400) HEMODYNAMICS: CVP:  [10 mmHg-11 mmHg] 10 mmHg VENTILATOR SETTINGS: Vent Mode:  [-] PSV;CPAP FiO2 (%):  [40 %] 40 % Set Rate:  [16 bmp] 16 bmp Vt Set:  [370 mL] 370 mL PEEP:  [5 cmH20] 5 cmH20 Pressure Support:  [5 cmH20] 5 cmH20 Plateau Pressure:  [18 cmH20] 18 cmH20 INTAKE / OUTPUT:  Intake/Output Summary (Last 24 hours) at 01/30/14 1516 Last data filed at 01/30/14 1500  Gross per 24 hour  Intake 1764.01 ml  Output   1605 ml  Net 159.01 ml    PHYSICAL EXAMINATION: General: NAD Neuro: no focal deficits HEENT: WNL Cardiovascular: reg, no  M Lungs: clear Abdomen: minimally distended, +BS Ext: warm, no edema   LABS: I have reviewed all of today's lab results. Relevant abnormalities are discussed in the A/P section  CXR: prob R effusion, LLL infiltrate vs atx  ASSESSMENT / PLAN:  PULMONARY OETT 1/9>>> A: Acute hypoxic respiratory failure Suspect aspiration event Doubt pulm edema P: Monitor in ICU post extubation Supp O2 as needed to maintain SpO2 > 92%  CARDIOVASCULAR CVL right IJ CVL 1/9 >>  A line 1/9 >> 1/11 A: Hx of HTN  H/o CAD Hypotension, resolved P:  Monitor  RENAL A:   Hypokalemia, recurrent P:   Monitor BMET intermittently Monitor I/Os Correct electrolytes as indicated  GASTROINTESTINAL A:   S/p colostomy takedown Post op ab tenderness P:   SUP w/ PPI  Surgery ok with advancing diet but has high NGT output. Will keep NGT to suction for now and keep NPO. Will consider CT abdomen for post op ab tenderness if she doesn't improve.  HEMATOLOGIC A:   Post-op anemia. No evidence of acute blood loss P:  DVT px: SCDs Monitor CBC intermittently Transfuse per usual ICU guidelines  INFECTIOUS A:  SIRS/sepsis Suspect aspiration PNA/pneumonitis   Minimally elevated PCT P:   BCx2 1/9>>  UC 1/9>>  Sputum 1/9>>   vanc 1/9>> 1/11 Zosyn 1/9>>>   ENDOCRINE A:   No acute issues P:   Monitor glu on chem panels Consider SSI  for glu > 180   NEUROLOGIC A:   Acute encephalopathy in setting of hypoxia, resolved Post op pain P:   DC propofol post extubation Low dose PRN fentanyl  RASS goal 0   FAMILY  - Updates updated her husband at bedside 1/11   Merton Border, MD ; [facility] service Mobile [phone].  After 5:30 PM or weekends, call [phone]

## 2014-01-30 NOTE — Progress Notes (Signed)
Echocardiogram 2D Echocardiogram has been performed.  [name] 01/30/2014, 1:16 PM

## 2014-01-30 NOTE — Progress Notes (Signed)
6 Days Post-Op  Subjective: On vent Awake, comfortable  Objective: Vital signs in last 24 hours: Temp:  [97.7 F (36.5 C)-98.3 F (36.8 C)] 98.1 F (36.7 C) (01/11 0336) Pulse Rate:  [84-106] 92 (01/11 0600) Resp:  [22-34] 25 (01/11 0600) BP: (56-109)/(38-68) 101/52 mmHg (01/11 0600) SpO2:  [100 %] 100 % (01/11 0600) FiO2 (%):  [40 %] 40 % (01/11 0600) Weight:  [143 lb 1.3 oz (64.9 kg)] 143 lb 1.3 oz (64.9 kg) (01/11 0400) Last BM Date: 01/29/14  Intake/Output from previous day: 01/10 0701 - 01/11 0700 In: 1731.2 [I.V.:1018.7; IV Piggyback:712.5] Out: 1226 [Urine:875; Emesis/NG output:350; Stool:1] Intake/Output this shift:    Abdomen soft, incision loosely approximated, serosang drainage, no purulence or cellulitis  Lab Results:   Recent Labs  01/29/14 1730 01/30/14 0430  WBC 8.9 7.7  HGB 8.1* 7.8*  HCT 25.1* 25.0*  PLT 211 207   BMET  Recent Labs  01/28/14 1850 01/29/14 0431  NA 140 143  K 3.2* 3.5  CL 107 111  CO2 26 25  GLUCOSE 132* 101*  BUN 10 13  CREATININE 0.60 0.66  CALCIUM 7.6* 7.6*   PT/INR No results for input(s): LABPROT, INR in the last 72 hours. ABG  Recent Labs  01/28/14 1251  PHART 7.397  HCO3 25.4*    Studies/Results: Portable Chest Xray  01/29/2014   CLINICAL DATA:  Acute respiratory failure  EXAM: PORTABLE CHEST - 1 VIEW  COMPARISON:  01/28/2014  FINDINGS: Multifocal interstitial/airspace opacities are improved, suggesting improving interstitial edema rather than pneumonia. Persistent left basilar opacity, likely a combination of atelectasis and small left pleural effusion.  The heart is top-normal in size.  Endotracheal tube terminates 3.5 cm above the carina.  Enteric tube courses below the diaphragm.  IMPRESSION: Suspected improving mild interstitial edema.  Patchy left basilar opacity, likely atelectasis, with small left pleural effusion.  Endotracheal tube terminates 3.5 cm above the carina.   Electronically Signed   By:  Julian Hy M.D.   On: 01/29/2014 07:42   Portable Chest Xray  01/28/2014   CLINICAL DATA:  Acute onset of respiratory failure. Initial encounter.  EXAM: PORTABLE CHEST - 1 VIEW  COMPARISON:  Chest radiograph performed 01/27/2014  FINDINGS: The patient's endotracheal tube is seen ending 2-3 cm above the carina. An enteric tube is noted extending below the diaphragm. A right IJ line is noted ending about the cavoatrial junction.  Lung expansion is mildly improved. Vascular congestion is noted, with bilateral airspace opacification. This may reflect pulmonary edema or multifocal pneumonia. A small left pleural effusion is noted.  The cardiomediastinal silhouette is mildly enlarged. No acute osseous abnormalities are identified.  IMPRESSION: 1. Endotracheal tube seen ending 2-3 cm above the carina. 2. Vascular congestion and mild cardiomegaly, with bilateral airspace opacification. This may reflect pulmonary edema or multifocal pneumonia. The appearance is somewhat more suggestive of pneumonia. Small left pleural effusion noted.   Electronically Signed   By: Garald Balding M.D.   On: 01/28/2014 11:16    Anti-infectives: Anti-infectives    Start     Dose/Rate Route Frequency Ordered Stop   01/29/14 0300  vancomycin (VANCOCIN) 500 mg in sodium chloride 0.9 % 100 mL IVPB     500 mg100 mL/hr over 60 Minutes Intravenous Every 12 hours 01/28/14 1446     01/28/14 2300  piperacillin-tazobactam (ZOSYN) IVPB 3.375 g     3.375 g12.5 mL/hr over 240 Minutes Intravenous Every 8 hours 01/28/14 1446     01/28/14  1500  piperacillin-tazobactam (ZOSYN) IVPB 3.375 g     3.375 g100 mL/hr over 30 Minutes Intravenous  Once 01/28/14 1446 01/28/14 1730   01/28/14 1500  vancomycin (VANCOCIN) IVPB 1000 mg/200 mL premix     1,000 mg200 mL/hr over 60 Minutes Intravenous  Once 01/28/14 1446 01/28/14 1811   01/24/14 0600  ceFAZolin (ANCEF) IVPB 2 g/50 mL premix     2 g100 mL/hr over 30 Minutes Intravenous On call to O.R.  01/23/14 1419 01/24/14 1321   01/24/14 0600  metroNIDAZOLE (FLAGYL) IVPB 500 mg     500 mg100 mL/hr over 60 Minutes Intravenous On call to O.R. 01/23/14 1419 01/25/14 0559      Assessment/Plan: s/p Procedure(s): COLOSTOMY TAKEDOWN (N/A)  Abdomen still soft with minimal tenderness.  She is continuing to have BM's.  WBC is normal.  Doubt, currently, an anastomotic leak.  Will D/C NG and start liquids once extubated Appreciate CCM's care Continuing antibiotics  LOS: 6 days    Kanav Kazmierczak A 01/30/2014

## 2014-01-30 NOTE — Progress Notes (Signed)
TPA removed from brown (distal) port of CVC; blood return sluggish but 10 ml of blood aspirated and port flushed with 10 ml NS; sterile CVC cap applied ; Dr Alva Garnet and Berton Lan RN from [facility] notified; Will continue to monitor.  Lenor Coffin, RN

## 2014-01-30 NOTE — Progress Notes (Signed)
UR Completed.  [phone]  

## 2014-01-30 NOTE — Procedures (Signed)
Extubation Procedure Note  Patient Details:   Name: [name] DOB: Apr 28, 1940 MRN: [id number]   Airway Documentation:     Evaluation  O2 sats: stable throughout Complications: No apparent complications Patient did tolerate procedure well. Bilateral Breath Sounds: Diminished Suctioning: Airway Yes   Patient extubated to 2L nasal cannula per MD order.  Positive cuff leak noted.  No evidence of stridor.  Sats currently 100%.  Vitals are stable.  Patient able to speak post extubation.  No apparent complications.  [name] 01/30/2014, 11:34 AM

## 2014-01-31 DIAGNOSIS — T17908D Unspecified foreign body in respiratory tract, part unspecified causing other injury, subsequent encounter: Secondary | ICD-10-CM

## 2014-01-31 LAB — COMPREHENSIVE METABOLIC PANEL
ALK PHOS: 85 U/L (ref 39–117)
ALT: 20 U/L (ref 0–35)
ANION GAP: 5 (ref 5–15)
AST: 29 U/L (ref 0–37)
Albumin: 1.8 g/dL — ABNORMAL LOW (ref 3.5–5.2)
BUN: 8 mg/dL (ref 6–23)
CO2: 26 mmol/L (ref 19–32)
Calcium: 7.3 mg/dL — ABNORMAL LOW (ref 8.4–10.5)
Chloride: 107 mEq/L (ref 96–112)
Creatinine, Ser: 0.52 mg/dL (ref 0.50–1.10)
GFR calc Af Amer: >90 mL/min (ref >90)
GFR calc non Af Amer: >90 mL/min (ref >90)
Glucose, Bld: 128 mg/dL — ABNORMAL HIGH (ref 70–99)
POTASSIUM: 3.3 mmol/L — AB (ref 3.5–5.1)
SODIUM: 138 mmol/L (ref 135–145)
TOTAL PROTEIN: 4.3 g/dL — AB (ref 6.0–8.3)
Total Bilirubin: 0.5 mg/dL (ref 0.3–1.2)

## 2014-01-31 LAB — CBC
HEMATOCRIT: 25.2 % — AB (ref 36.0–46.0)
Hemoglobin: 8 g/dL — ABNORMAL LOW (ref 12.0–15.0)
MCH: 25.6 pg — ABNORMAL LOW (ref 26.0–34.0)
MCHC: 31.7 g/dL (ref 30.0–36.0)
MCV: 80.5 fL (ref 78.0–100.0)
Platelets: 242 10*3/uL (ref 150–400)
RBC: 3.13 MIL/uL — ABNORMAL LOW (ref 3.87–5.11)
RDW: 17.7 % — ABNORMAL HIGH (ref 11.5–15.5)
WBC: 7 10*3/uL (ref 4.0–10.5)

## 2014-01-31 LAB — CULTURE, RESPIRATORY W GRAM STAIN
Gram Stain: NONE SEEN
Special Requests: NORMAL

## 2014-01-31 LAB — CLOSTRIDIUM DIFFICILE BY PCR: CDIFFPCR: NEGATIVE

## 2014-01-31 LAB — CULTURE, RESPIRATORY

## 2014-01-31 LAB — VANCOMYCIN, TROUGH: Vancomycin Tr: <3.5 ug/mL — ABNORMAL LOW (ref 10.0–20.0)

## 2014-01-31 MED ORDER — POTASSIUM CHLORIDE 10 MEQ/100ML IV SOLN: 10.0000 meq | INTRAVENOUS | Status: DC

## 2014-01-31 MED ORDER — OXYCODONE-ACETAMINOPHEN 5-325 MG PO TABS
1.0000 | ORAL_TABLET | ORAL | Status: DC | PRN
  Administered 2014-02-01 – 2014-02-03 (×2): 2 via ORAL
  Filled 2014-01-31 (×2): qty 2

## 2014-01-31 MED ORDER — POTASSIUM CHLORIDE CRYS ER 20 MEQ PO TBCR
40.0000 meq | EXTENDED_RELEASE_TABLET | Freq: Two times a day (BID) | ORAL | Status: DC
  Administered 2014-01-31 – 2014-02-01 (×3): 40 meq via ORAL
  Filled 2014-01-31 (×4): qty 2

## 2014-01-31 MED ORDER — CETYLPYRIDINIUM CHLORIDE 0.05 % MT LIQD
7.0000 mL | Freq: Two times a day (BID) | OROMUCOSAL | Status: DC
Start: 2014-01-31 — End: 2014-02-03
  Administered 2014-01-31 – 2014-02-03 (×7): 7 mL via OROMUCOSAL

## 2014-01-31 MED ORDER — HYDROMORPHONE HCL 1 MG/ML IJ SOLN
1.0000 mg | INTRAMUSCULAR | Status: DC | PRN
  Administered 2014-02-01: 1 mg via INTRAVENOUS
  Filled 2014-01-31: qty 1

## 2014-01-31 NOTE — Progress Notes (Signed)
ANTIBIOTIC CONSULT NOTE - FOLLOW UP  Pharmacy Consult for Zosyn Indication: R/O sepsis, suspected aspiration PNA  Allergies  Allergen Reactions  . Pregabalin Swelling    Tongue swelling  . Sulfonamide Derivatives Swelling    Childhood reaction    Patient Measurements: Height: 5\' 1"  (154.9 cm) Weight: 143 lb 1.3 oz (64.9 kg) IBW/kg (Calculated) : 47.8  Vital Signs: Temp: 97.8 F (36.6 C) (01/12 0728) Temp Source: Oral (01/12 0728) BP: 114/51 mmHg (01/12 0700) Pulse Rate: 83 (01/12 0700) Intake/Output from previous day: 01/11 0701 - 01/12 0700 In: 2275.7 [P.O.:270; I.V.:1838.2; NG/GT:30; IV Piggyback:137.5] Out: 995 [Urine:665; Emesis/NG output:130; NWGNF:621] Intake/Output from this shift:    Labs:  Recent Labs  01/29/14 0431 01/29/14 1730 01/30/14 0430 01/31/14 0500  WBC 7.6 8.9 7.7 7.0  HGB 8.2* 8.1* 7.8* 8.0*  PLT 213 211 207 242  CREATININE 0.66  --  0.67 0.52   Estimated Creatinine Clearance: 53.2 mL/min (by C-G formula based on Cr of 0.52).  Recent Labs  01/31/14 0230  VANCOTROUGH <3.5*     Microbiology: Recent Results (from the past 720 hour(s))  MRSA PCR Screening     Status: None   Collection Time: 01/27/14 12:22 PM  Result Value Ref Range Status   MRSA by PCR NEGATIVE NEGATIVE Final    Comment:        The GeneXpert MRSA Assay (FDA approved for NASAL specimens only), is one component of a comprehensive MRSA colonization surveillance program. It is not intended to diagnose MRSA infection nor to guide or monitor treatment for MRSA infections.   Culture, blood (routine x 2)     Status: None (Preliminary result)   Collection Time: 01/28/14  7:05 AM  Result Value Ref Range Status   Specimen Description BLOOD LEFT HAND  Final   Special Requests BOTTLES DRAWN AEROBIC AND ANAEROBIC 10CC  Final   Culture   Final           BLOOD CULTURE RECEIVED NO GROWTH TO DATE CULTURE WILL BE HELD FOR 5 DAYS BEFORE ISSUING A FINAL NEGATIVE REPORT Performed  at [facility]    Report Status PENDING  Incomplete  Culture, blood (routine x 2)     Status: None (Preliminary result)   Collection Time: 01/28/14  7:18 AM  Result Value Ref Range Status   Specimen Description BLOOD LEFT THUMB  Final   Special Requests BOTTLES DRAWN AEROBIC AND ANAEROBIC 5CC  Final   Culture   Final           BLOOD CULTURE RECEIVED NO GROWTH TO DATE CULTURE WILL BE HELD FOR 5 DAYS BEFORE ISSUING A FINAL NEGATIVE REPORT Performed at [facility]    Report Status PENDING  Incomplete  Culture, respiratory (NON-Expectorated)     Status: None   Collection Time: 01/28/14  3:35 PM  Result Value Ref Range Status   Specimen Description TRACHEAL ASPIRATE  Final   Special Requests Normal  Final   Gram Stain   Final    NO WBC SEEN NO SQUAMOUS EPITHELIAL CELLS SEEN NO ORGANISMS SEEN Performed at [facility]    Culture   Final    Non-Pathogenic Oropharyngeal-type Flora Isolated. Performed at [facility]    Report Status 01/31/2014 FINAL  Final  Clostridium Difficile by PCR     Status: None   Collection Time: 01/30/14  5:30 PM  Result Value Ref Range Status   C difficile by pcr NEGATIVE NEGATIVE Final    Anti-infectives  Start     Dose/Rate Route Frequency Ordered Stop   01/29/14 0300  vancomycin (VANCOCIN) 500 mg in sodium chloride 0.9 % 100 mL IVPB  Status:  Discontinued     500 mg100 mL/hr over 60 Minutes Intravenous Every 12 hours 01/28/14 1446 01/30/14 1158   01/28/14 2300  piperacillin-tazobactam (ZOSYN) IVPB 3.375 g     3.375 g12.5 mL/hr over 240 Minutes Intravenous Every 8 hours 01/28/14 1446     01/28/14 1500  piperacillin-tazobactam (ZOSYN) IVPB 3.375 g     3.375 g100 mL/hr over 30 Minutes Intravenous  Once 01/28/14 1446 01/28/14 1730   01/28/14 1500  vancomycin (VANCOCIN) IVPB 1000 mg/200 mL premix     1,000 mg200 mL/hr over 60 Minutes Intravenous  Once 01/28/14 1446 01/28/14 1811   01/24/14 0600  ceFAZolin (ANCEF) IVPB 2  g/50 mL premix     2 g100 mL/hr over 30 Minutes Intravenous On call to O.R. 01/23/14 1419 01/24/14 1321   01/24/14 0600  metroNIDAZOLE (FLAGYL) IVPB 500 mg     500 mg100 mL/hr over 60 Minutes Intravenous On call to O.R. 01/23/14 1419 01/25/14 0559      Assessment: 32 YOF s/p colostomy takedown 01/25/14 and transferred 01/28/14 to ICU for acute respiratory distress, new fever, delirium, and hemoglobin drop. Started vancomycin and zosyn for sepsis, now considering possible aspiration PNA so vancomycin was stopped and zosyn was continued. CXR shows infiltrates vs edema. This is day #4 of Zosyn. Pt remains afebrile, WBC wnl, and CrCl stable at 58ml/min.  Goal of Therapy:  Eradication of infection  Plan:  Continue Zosyn 3.375g IV Q8 (4 hr infusion) Monitor clinical picture F/U abx LOT  [name] J 01/31/2014,2:44 PM

## 2014-01-31 NOTE — Progress Notes (Addendum)
PULMONARY / CRITICAL CARE MEDICINE   Name: [name] MRN: [id number] DOB: 1940/05/27    ADMISSION DATE:  01/24/2014 CONSULTATION DATE:  1/9   REFERRING MD :  Georgette Dover   CHIEF COMPLAINT:   Acute hypoxic respiratory failure   INITIAL PRESENTATION:  74 year old female hx CAD, dvt, copd, stroke, HTN, trach 07/2013 (not currently), arthritis who was s/p Colostomy takedown on 1/6. Post-op course was unremarkable until 1/9 when she was emergently transferred to the ICU w/ acute respiratory distress, new fever, delirium and hgb drop from 8.1 to 6.7. PCCM was asked to assist w/ her care. Was emergently intubated shortly after arrival to the ICU.   STUDIES: 1/11 TTE: normal EF 55-60%. No diastolic dysfunction.    SIGNIFICANT EVENTS: 1/6 colostomy takedown, lysis of adhesion  1/9 transferred to ICU with respiratory distress and bilateral pulmonary infiltrates; fever; hgb dropped to 6.7 . Transfused 2 units PRBCs 1/11 CXR improved. Passed SBT. Cognition intact. Extubated. NGT out, started clears.  Overnight: added 15mg  toradol q6hr for pain.   SUBJECTIVE:  Has pain at rectal tube site, no complaints otherwise.    VITAL SIGNS: Temp:  [97.3 F (36.3 C)-99 F (37.2 C)] 97.8 F (36.6 C) (01/12 0728) Pulse Rate:  [72-100] 83 (01/12 0700) Resp:  [19-35] 22 (01/12 0700) BP: (70-119)/(19-83) 114/51 mmHg (01/12 0700) SpO2:  [98 %-100 %] 99 % (01/12 0700) FiO2 (%):  [40 %] 40 % (01/11 0850) HEMODYNAMICS:   VENTILATOR SETTINGS: Vent Mode:  [-] PSV;CPAP FiO2 (%):  [40 %] 40 % PEEP:  [5 cmH20] 5 cmH20 Pressure Support:  [5 cmH20] 5 cmH20 INTAKE / OUTPUT:  Intake/Output Summary (Last 24 hours) at 01/31/14 7829 Last data filed at 01/31/14 0700  Gross per 24 hour  Intake 2169.1 ml  Output    965 ml  Net 1204.1 ml    PHYSICAL EXAMINATION: General: NAD Neuro: no focal deficits HEENT: WNL Cardiovascular: reg, no M Lungs: clear Abdomen: minimally distended, +BS, nontender.  Dressing c/d/i. Ext: warm, no edema   LABS: I have reviewed all of today's lab results. Relevant abnormalities are discussed in the A/P section  CXR: prob R effusion, LLL infiltrate vs atx  ASSESSMENT / PLAN:  PULMONARY OETT 1/9>>> 1/11. A: Acute hypoxic respiratory failure Suspect aspiration event Doubt pulm edema P: Monitor in ICU post extubation Supp O2 as needed to maintain SpO2 > 92%  CARDIOVASCULAR CVL right IJ CVL 1/9 >>  A line 1/9 >> 1/11 A: Hx of HTN  H/o CAD Hypotension - resolved ECHO 55-60% EF, normal dias.  P:  Cont IVF  Monitor  RENAL A:   Hypokalemia, recurrent P:   Monitor BMET intermittently Monitor I/Os Correct electrolytes as indicated (has kcl in fluid, also gave extra IV).  GASTROINTESTINAL A:   S/p colostomy takedown Post op ab tenderness P:   Dc/ed NGT 1/11 and started clears. Advance as tolerates.   HEMATOLOGIC A:   Post-op anemia. No evidence of acute blood loss hgb stable post transfusion P:  DVT px: SCDs Monitor CBC intermittently  Transfuse per usual ICU guidelines  INFECTIOUS A:  SIRS/sepsis Suspect aspiration PNA/pneumonitis   Minimally elevated PCT P:   BCx2 1/9>>  UC 1/9>>  Sputum 1/9>>   vanc 1/9>> 1/11 Zosyn 1/9>>>  Consider d/cing zosyn as remains afebrile and cultures negative.   ENDOCRINE A:   No acute issues P:   Monitor glu on chem panels Consider SSI for glu > 180   NEUROLOGIC A:  Acute encephalopathy in setting of hypoxia, resolved Post op pain - improved Pain at rectal tube site P:   Low dose PRN fentanyl Cont toradol q6hr prn for pain.  RASS goal 0  Today's summar: hemodynamically stable today, remains afebrile. Would consider d/c zosyn today, transfer to med-surg back to surgery team. Advance diet.  FAMILY  - Updates updated her husband at bedside 1/11  Dellia Nims, M.D PGY-1 Internal Medicine Pager [phone]   PCCM ATTENDING: I have reviewed pt's initial presentation,  consultants notes and hospital database in detail.  The above assessment and plan was formulated under my direction.  Discussed with Dr Ninfa Linden. Would complete 7 days of pip-tazo empirically for presumed aspiration PNA/ALI  Merton Border, MD;  PCCM service; Mobile [phone]

## 2014-01-31 NOTE — Progress Notes (Signed)
7 Days Post-Op  Subjective: Feeling better Denies abdominal pain or SOB  Objective: Vital signs in last 24 hours: Temp:  [97.3 F (36.3 C)-99 F (37.2 C)] 97.8 F (36.6 C) (01/12 0728) Pulse Rate:  [77-100] 83 (01/12 0700) Resp:  [20-35] 22 (01/12 0700) BP: (70-119)/(19-83) 114/51 mmHg (01/12 0700) SpO2:  [98 %-100 %] 99 % (01/12 0700) Last BM Date: 01/30/14  Intake/Output from previous day: 01/11 0701 - 01/12 0700 In: 2275.7 [P.O.:270; I.V.:1838.2; NG/GT:30; IV Piggyback:137.5] Out: 995 [Urine:665; Emesis/NG output:130; Stool:200] Intake/Output this shift:    Abdomen soft, incision stable  Lab Results:   Recent Labs  01/30/14 0430 01/31/14 0500  WBC 7.7 7.0  HGB 7.8* 8.0*  HCT 25.0* 25.2*  PLT 207 242   BMET  Recent Labs  01/30/14 0430 01/31/14 0500  NA 142 138  K 3.4* 3.3*  CL 111 107  CO2 21 26  GLUCOSE 61* 128*  BUN 15 8  CREATININE 0.67 0.52  CALCIUM 7.7* 7.3*   PT/INR No results for input(s): LABPROT, INR in the last 72 hours. ABG  Recent Labs  01/28/14 1251  PHART 7.397  HCO3 25.4*    Studies/Results: Dg Chest Port 1 View  01/30/2014   CLINICAL DATA:  Respiratory distress, shortness of breath, acute respiratory failure ; history of COPD and coronary artery disease  EXAM: PORTABLE CHEST - 1 VIEW  COMPARISON:  Portable chest x-ray of January 29, 2013  FINDINGS: The lungs are adequately inflated. The interstitial markings have increased further. The left hemidiaphragm is completely obscured today. The retrocardiac region demonstrates increased density. There is a small left pleural effusion. On the right increasing density in the mid and lower lung zone partially obscures the hemidiaphragm laterally. The cardiac silhouette is top-normal in size. The central pulmonary vascularity is prominent.  The endotracheal tube tip projects 3.9 cm above the crotch of the carina. The esophagogastric tube tip projects below the level of the GE junction. The right  internal jugular venous catheter tip projects over the lower third of the SVC.  IMPRESSION: Deteriorating appearance of the pulmonary interstitium consistent with worsening pulmonary edema. Increasing atelectasis or infiltrate in the left lower lobe is present. There are small bilateral pleural effusions which are more conspicuous today especially on the right.   Electronically Signed   By: David  Martinique   On: 01/30/2014 07:24    Anti-infectives: Anti-infectives    Start     Dose/Rate Route Frequency Ordered Stop   01/29/14 0300  vancomycin (VANCOCIN) 500 mg in sodium chloride 0.9 % 100 mL IVPB  Status:  Discontinued     500 mg100 mL/hr over 60 Minutes Intravenous Every 12 hours 01/28/14 1446 01/30/14 1158   01/28/14 2300  piperacillin-tazobactam (ZOSYN) IVPB 3.375 g     3.375 g12.5 mL/hr over 240 Minutes Intravenous Every 8 hours 01/28/14 1446     01/28/14 1500  piperacillin-tazobactam (ZOSYN) IVPB 3.375 g     3.375 g100 mL/hr over 30 Minutes Intravenous  Once 01/28/14 1446 01/28/14 1730   01/28/14 1500  vancomycin (VANCOCIN) IVPB 1000 mg/200 mL premix     1,000 mg200 mL/hr over 60 Minutes Intravenous  Once 01/28/14 1446 01/28/14 1811   01/24/14 0600  ceFAZolin (ANCEF) IVPB 2 g/50 mL premix     2 g100 mL/hr over 30 Minutes Intravenous On call to O.R. 01/23/14 1419 01/24/14 1321   01/24/14 0600  metroNIDAZOLE (FLAGYL) IVPB 500 mg     500 mg100 mL/hr over 60 Minutes Intravenous On  call to O.R. 01/23/14 1419 01/25/14 0559      Assessment/Plan: s/p Procedure(s): COLOSTOMY TAKEDOWN (N/A)  Transfer to floor D/c rectal tube Advance po Will continue zosyn for a total of 7 days  LOS: 7 days    [name] A 01/31/2014

## 2014-02-01 ENCOUNTER — Inpatient Hospital Stay (HOSPITAL_COMMUNITY): Payer: Commercial Managed Care - HMO

## 2014-02-01 LAB — BASIC METABOLIC PANEL
Anion gap: 4 — ABNORMAL LOW (ref 5–15)
CO2: 25 mmol/L (ref 19–32)
CREATININE: 0.5 mg/dL (ref 0.50–1.10)
Calcium: 7.9 mg/dL — ABNORMAL LOW (ref 8.4–10.5)
Chloride: 108 mEq/L (ref 96–112)
GFR calc Af Amer: >90 mL/min (ref >90)
GFR calc non Af Amer: >90 mL/min (ref >90)
Glucose, Bld: 101 mg/dL — ABNORMAL HIGH (ref 70–99)
Potassium: 3.8 mmol/L (ref 3.5–5.1)
Sodium: 137 mmol/L (ref 135–145)

## 2014-02-01 LAB — TYPE AND SCREEN
ABO/RH(D): B POS
Antibody Screen: NEGATIVE
UNIT DIVISION: 0
Unit division: 0
Unit division: 0

## 2014-02-01 MED ORDER — PANTOPRAZOLE SODIUM 40 MG PO TBEC
40.0000 mg | DELAYED_RELEASE_TABLET | Freq: Every day | ORAL | Status: DC
  Administered 2014-02-01 – 2014-02-03 (×3): 40 mg via ORAL
  Filled 2014-02-01: qty 1

## 2014-02-01 MED ORDER — MIRTAZAPINE 15 MG PO TABS
15.0000 mg | ORAL_TABLET | Freq: Every day | ORAL | Status: DC
  Administered 2014-02-01 – 2014-02-02 (×2): 15 mg via ORAL
  Filled 2014-02-01 (×3): qty 1

## 2014-02-01 NOTE — Evaluation (Signed)
Physical Therapy Evaluation Patient Details Name: [name] MRN: [id number] DOB: [birth year] Today's Date: 02/01/2014   History of Present Illness  Patient is a 74 y/o female s/p colostomy takedown 1/6. Post-op course was unremarkable until 1/9 when she was emergently transferred to the ICU w/ acute respiratory distress, new fever, delirium and hgb drop from 8.1 to 6.7 and intubated. Extubated 1/11. PMH of CAD, dvt, copd, stroke, HTN, trach 07/2013 (not currently), arthritis    Clinical Impression  Patient presents with functional limitations due to deficits listed in PT problem list (see below). Pt with generalized weakness, deconditioning, and impaired endurance impacting safe mobility. Tolerated ambulation and transfers with Min guard assist for safety. Pt has necessary assist/S at home. Pt would benefit from skilled PT to improve transfers, gait, balance and mobility so pt can maximize independence and return to PLOF.    Follow Up Recommendations Home health PT;Supervision/Assistance - 24 hour    Equipment Recommendations  None recommended by PT    Recommendations for Other Services       Precautions / Restrictions Precautions Precautions: Fall Restrictions Weight Bearing Restrictions: No      Mobility  Bed Mobility Overal bed mobility: Needs Assistance Bed Mobility: Rolling;Sidelying to Sit Rolling: Min guard Sidelying to sit: Min guard;HOB elevated       General bed mobility comments: Min guard for safety.  Cues for log roll technique.  Transfers Overall transfer level: Needs assistance Equipment used: Rolling walker (2 wheeled) Transfers: Sit to/from Stand Sit to Stand: Min guard         General transfer comment: Min guard for safety. Increased effort to stand. Cues for hand placement.  Ambulation/Gait Ambulation/Gait assistance: Min guard Ambulation Distance (Feet): 150 Feet Assistive device: Rolling walker (2 wheeled) Gait Pattern/deviations:  Step-through pattern;Decreased stride length;Trunk flexed Gait velocity: decreased Gait velocity interpretation: Below normal speed for age/gender General Gait Details: Pt with slow, steady gait. Ambulated on RA, Sa02 remained >91%.   Stairs            Wheelchair Mobility    Modified Rankin (Stroke Patients Only)       Balance Overall balance assessment: Needs assistance Sitting-balance support: Feet supported;No upper extremity supported Sitting balance-Leahy Scale: Good     Standing balance support: During functional activity Standing balance-Leahy Scale: Poor Standing balance comment: Requires BUE support on RW for balance/safety.                             Pertinent Vitals/Pain Pain Assessment: 0-10 Pain Score:  (not rated on pain scale.) Pain Location: abdomen Pain Descriptors / Indicators: Sore Pain Intervention(s): Monitored during session;Premedicated before session;Repositioned    Home Living Family/patient expects to be discharged to:: Private residence Living Arrangements: Spouse/significant other Available Help at Discharge: Family;Available 24 hours/day Type of Home: House Home Access: Stairs to enter Entrance Stairs-Rails: None Entrance Stairs-Number of Steps: 2 Home Layout: One level Home Equipment: Wheelchair - manual;Walker - standard;Shower seat;Walker - 2 wheels;Cane - single point;Bedside commode      Prior Function Level of Independence: Needs assistance   Gait / Transfers Assistance Needed: Pt ambulating with RW PTA.  ADL's / Homemaking Assistance Needed: Supervision and some assist for dressing/bathing.        Hand Dominance        Extremity/Trunk Assessment   Upper Extremity Assessment: Defer to OT evaluation           Lower Extremity Assessment: Generalized  weakness         Communication   Communication: No difficulties  Cognition Arousal/Alertness: Awake/alert Behavior During Therapy: WFL for tasks  assessed/performed Overall Cognitive Status: Within Functional Limits for tasks assessed                      General Comments      Exercises        Assessment/Plan    PT Assessment Patient needs continued PT services  PT Diagnosis Generalized weakness;Acute pain   PT Problem List Decreased strength;Pain;Decreased activity tolerance;Decreased balance;Decreased mobility  PT Treatment Interventions Balance training;Gait training;Patient/family education;Functional mobility training;Therapeutic activities;Therapeutic exercise;Stair training   PT Goals (Current goals can be found in the Care Plan section) Acute Rehab PT Goals Patient Stated Goal: to return home PT Goal Formulation: With patient Time For Goal Achievement: 02/15/14 Potential to Achieve Goals: Fair    Frequency Min 3X/week   Barriers to discharge        Co-evaluation               End of Session Equipment Utilized During Treatment: Gait belt Activity Tolerance: Patient tolerated treatment well Patient left: in chair;with call bell/phone within reach;with family/visitor present Nurse Communication: Mobility status         Time: 1203-1227 PT Time Calculation (min) (ACUTE ONLY): 24 min   Charges:   PT Evaluation $Initial PT Evaluation Tier I: 1 Procedure PT Treatments $Gait Training: 8-22 mins   PT G CodesCandy Sledge A 2014-02-14, 1:17 PM  Candy Sledge, [place], DPT [phone]

## 2014-02-01 NOTE — Consult Note (Signed)
Patient is a [employer] member. Met with the patient at bedside but patient was sound asleep.  Family at bedside states she was napping after her therapy. A packet with information about [facility] Care Management services.  Discussed the case with inpatient RNCM and states patient likely to discharge tomorrow or Friday after completion of IV antibiotics.  Patient's current discharge plan is home with home health services.  Family asked if [facility] Liaison can come by tomorrow.  Of note, [facility] Care Management does not replace or interfere with any services arranged by the inpatient RNCM or social worker. Will continue to follow up for community care management needs.  If any questions, please call Natividad Brood, RN, BSN, CCM at [phone].

## 2014-02-01 NOTE — Progress Notes (Signed)
PULMONARY / CRITICAL CARE MEDICINE   Name: [name] MRN: [id number] DOB: [birth year]    ADMISSION DATE:  01/24/2014 CONSULTATION DATE:  1/9   REFERRING MD :  Georgette Dover   CHIEF COMPLAINT:   Acute hypoxic respiratory failure   INITIAL PRESENTATION:  74 year old female hx CAD, dvt, copd, stroke, HTN, trach 07/2013 (not currently), arthritis who was s/p Colostomy takedown on 1/6. Post-op course was unremarkable until 1/9 when she was emergently transferred to the ICU w/ acute respiratory distress, new fever, delirium and hgb drop from 8.1 to 6.7. PCCM was asked to assist w/ her care. Was emergently intubated shortly after arrival to the ICU.   STUDIES: 1/11 TTE: normal EF 55-60%. No diastolic dysfunction.    SIGNIFICANT EVENTS: 1/6 colostomy takedown, lysis of adhesion  1/9 transferred to ICU with respiratory distress and bilateral pulmonary infiltrates; fever; hgb dropped to 6.7 . Transfused 2 units PRBCs 1/11 CXR improved. Passed SBT. Cognition intact. Extubated. NGT out, started clears.    SUBJECTIVE:  Has pain at rectal tube site, no complaints otherwise.    VITAL SIGNS: Temp:  [97.6 F (36.4 C)-98.7 F (37.1 C)] 97.6 F (36.4 C) (01/13 0537) Pulse Rate:  [78-105] 89 (01/13 0537) Resp:  [16-31] 17 (01/13 0537) BP: (81-119)/(46-64) 119/46 mmHg (01/13 0537) SpO2:  [96 %-100 %] 98 % (01/13 0537) Weight:  [147 lb 4.3 oz (66.8 kg)] 147 lb 4.3 oz (66.8 kg) (01/12 2034) HEMODYNAMICS:   VENTILATOR SETTINGS:   INTAKE / OUTPUT:  Intake/Output Summary (Last 24 hours) at 02/01/14 0935 Last data filed at 02/01/14 0277  Gross per 24 hour  Intake 1170.17 ml  Output    600 ml  Net 570.17 ml    PHYSICAL EXAMINATION: General: NAD Neuro: no focal deficits HEENT: WNL Cardiovascular: reg, no M Lungs: clear, on room air, shallow respirations Abdomen: minimally distended, +BS, nontender. Dressing c/d/i. Ext: warm, no edema   LABS:  Recent Labs Lab 01/30/14 0430  01/31/14 0500 02/01/14 0440  NA 142 138 137  K 3.4* 3.3* 3.8  CL 111 107 108  CO2 21 26 25   BUN 15 8 <5*  CREATININE 0.67 0.52 0.50  GLUCOSE 61* 128* 101*    Recent Labs Lab 01/29/14 1730 01/30/14 0430 01/31/14 0500  HGB 8.1* 7.8* 8.0*  HCT 25.1* 25.0* 25.2*  WBC 8.9 7.7 7.0  PLT 211 207 242     Intake/Output Summary (Last 24 hours) at 02/01/14 0940 Last data filed at 02/01/14 4128  Gross per 24 hour  Intake 1170.17 ml  Output    600 ml  Net 570.17 ml    Dg Chest Port 1 View  02/01/2014   CLINICAL DATA:  Shortness of breath, respiratory failure  EXAM: PORTABLE CHEST - 1 VIEW  COMPARISON:  Portable chest x-ray of 01/30/2014  FINDINGS: There is persistent pulmonary vascular congestion. However with prominent markings at both lung bases and probable effusions, pneumonia cannot be excluded. Cardiomegaly is stable. The right IJ central venous line is unchanged in position. The endotracheal tube is no longer seen.  IMPRESSION: Endotracheal tube not visualized. Little change in pulmonary vascular congestion and effusions. Cannot exclude lower lobe pneumonia left-greater-than-right.   Electronically Signed   By: Ivar Drape M.D.   On: 02/01/2014 08:16     ASSESSMENT / PLAN:  PULMONARY OETT 1/9>>> 1/11. A: Acute hypoxic respiratory failure Suspect aspiration event Doubt pulm edema P: Monitor in ICU post extubation Supp O2 as needed to maintain SpO2 > 92%  On room air 1/13  CARDIOVASCULAR CVL right IJ CVL 1/9 >>  A line 1/9 >> 1/11 A: Hx of HTN  H/o CAD Hypotension - resolved ECHO 55-60% EF, normal dias.  P:  Cont IVF  Monitor  RENAL A:   Hypokalemia, recurrent  Recent Labs Lab 01/30/14 0430 01/31/14 0500 02/01/14 0440  K 3.4* 3.3* 3.8     P:   Monitor BMET intermittently Monitor I/Os Correct electrolytes as indicated.  GASTROINTESTINAL A:   S/p colostomy takedown Post op ab tenderness P:   Dc/ed NGT 1/11 and started clears. Advance as  tolerates.   HEMATOLOGIC  Recent Labs  01/30/14 0430 01/31/14 0500  HGB 7.8* 8.0*    A:   Post-op anemia. No evidence of acute blood loss hgb stable post transfusion P:  DVT px: SCDs Monitor CBC intermittently  Transfuse per usual ICU guidelines  INFECTIOUS A:  SIRS/sepsis Suspect aspiration PNA/pneumonitis   Minimally elevated PCT P:   BCx2 1/9>>  UC 1/9>> neg Sputum 1/9>> nl flora  vanc 1/9>> 1/11 Zosyn 1/9>>>  Consider d/cing zosyn as remains afebrile and cultures negative.   ENDOCRINE A:   No acute issues P:   Monitor glu on chem panels Consider SSI for glu > 180   NEUROLOGIC A:   Acute encephalopathy in setting of hypoxia, resolved Post op pain - improved Pain at rectal tube site P:   Low dose PRN fentanyl Cont toradol q6hr prn for pain.  RASS goal 0  Today's summar: hemodynamically stable today, remains afebrile. Would consider d/c zosyn today, transfer to med-surg back to surgery team. Advance diet.  FAMILY  - Updates updated her husband at bedside 1/13 PCCM will sign off 1/13  [facility] Minor ACNP Maryanna Shape PCCM Pager [phone] till 3 pm If no answer page [phone] 02/01/2014, 9:35 AM  Attending Note:  I have examined patient, reviewed labs, studies and notes. I have discussed the case with S Minor, and I agree with the data and plans as amended above.  Baltazar Apo, MD, PhD 02/01/2014, 3:45 PM [facility] [phone] or if no answer [phone]

## 2014-02-01 NOTE — Progress Notes (Signed)
8 Days Post-Op  Subjective: Continuing to look and feel better  Objective: Vital signs in last 24 hours: Temp:  [97.6 F (36.4 C)-98.7 F (37.1 C)] 97.6 F (36.4 C) (01/13 0537) Pulse Rate:  [78-105] 89 (01/13 0537) Resp:  [16-31] 17 (01/13 0537) BP: (88-119)/(46-58) 119/46 mmHg (01/13 0537) SpO2:  [96 %-100 %] 98 % (01/13 0537) Weight:  [147 lb 4.3 oz (66.8 kg)] 147 lb 4.3 oz (66.8 kg) (01/12 2034) Last BM Date: 02/01/14  Intake/Output from previous day: 01/12 0701 - 01/13 0700 In: 1345.2 [P.O.:240; I.V.:1017.7; IV Piggyback:87.5] Out: 600 [Urine:500; Stool:100] Intake/Output this shift: Total I/O In: 118 [P.O.:118] Out: -   Lungs clearer Abdomen soft, incision clean  Lab Results:   Recent Labs  01/30/14 0430 01/31/14 0500  WBC 7.7 7.0  HGB 7.8* 8.0*  HCT 25.0* 25.2*  PLT 207 242   BMET  Recent Labs  01/31/14 0500 02/01/14 0440  NA 138 137  K 3.3* 3.8  CL 107 108  CO2 26 25  GLUCOSE 128* 101*  BUN 8 <5*  CREATININE 0.52 0.50  CALCIUM 7.3* 7.9*   PT/INR No results for input(s): LABPROT, INR in the last 72 hours. ABG No results for input(s): PHART, HCO3 in the last 72 hours.  Invalid input(s): PCO2, PO2  Studies/Results: Dg Chest Port 1 View  02/01/2014   CLINICAL DATA:  Shortness of breath, respiratory failure  EXAM: PORTABLE CHEST - 1 VIEW  COMPARISON:  Portable chest x-ray of 01/30/2014  FINDINGS: There is persistent pulmonary vascular congestion. However with prominent markings at both lung bases and probable effusions, pneumonia cannot be excluded. Cardiomegaly is stable. The right IJ central venous line is unchanged in position. The endotracheal tube is no longer seen.  IMPRESSION: Endotracheal tube not visualized. Little change in pulmonary vascular congestion and effusions. Cannot exclude lower lobe pneumonia left-greater-than-right.   Electronically Signed   By: Ivar Drape M.D.   On: 02/01/2014 08:16    Anti-infectives: Anti-infectives    Start     Dose/Rate Route Frequency Ordered Stop   01/29/14 0300  vancomycin (VANCOCIN) 500 mg in sodium chloride 0.9 % 100 mL IVPB  Status:  Discontinued     500 mg100 mL/hr over 60 Minutes Intravenous Every 12 hours 01/28/14 1446 01/30/14 1158   01/28/14 2300  piperacillin-tazobactam (ZOSYN) IVPB 3.375 g     3.375 g12.5 mL/hr over 240 Minutes Intravenous Every 8 hours 01/28/14 1446     01/28/14 1500  piperacillin-tazobactam (ZOSYN) IVPB 3.375 g     3.375 g100 mL/hr over 30 Minutes Intravenous  Once 01/28/14 1446 01/28/14 1730   01/28/14 1500  vancomycin (VANCOCIN) IVPB 1000 mg/200 mL premix     1,000 mg200 mL/hr over 60 Minutes Intravenous  Once 01/28/14 1446 01/28/14 1811   01/24/14 0600  ceFAZolin (ANCEF) IVPB 2 g/50 mL premix     2 g100 mL/hr over 30 Minutes Intravenous On call to O.R. 01/23/14 1419 01/24/14 1321   01/24/14 0600  metroNIDAZOLE (FLAGYL) IVPB 500 mg     500 mg100 mL/hr over 60 Minutes Intravenous On call to O.R. 01/23/14 1419 01/25/14 0559      Assessment/Plan: s/p Procedure(s): COLOSTOMY TAKEDOWN (N/A)  Continue PT Zosyn for 2 more days  LOS: 8 days    Alpha Chouinard A 02/01/2014

## 2014-02-02 LAB — BASIC METABOLIC PANEL
Anion gap: 17 — ABNORMAL HIGH (ref 5–15)
BUN: <5 mg/dL — ABNORMAL LOW (ref 6–23)
CO2: 21 mmol/L (ref 19–32)
Calcium: 8.8 mg/dL (ref 8.4–10.5)
Chloride: 107 mEq/L (ref 96–112)
Creatinine, Ser: 0.61 mg/dL (ref 0.50–1.10)
GFR calc Af Amer: >90 mL/min (ref >90)
GFR calc non Af Amer: 87 mL/min — ABNORMAL LOW (ref >90)
Glucose, Bld: 93 mg/dL (ref 70–99)
Potassium: 3.7 mmol/L (ref 3.5–5.1)
SODIUM: 145 mmol/L (ref 135–145)

## 2014-02-02 LAB — CBC
HCT: 32.3 % — ABNORMAL LOW (ref 36.0–46.0)
HEMOGLOBIN: 10.2 g/dL — AB (ref 12.0–15.0)
MCH: 26 pg (ref 26.0–34.0)
MCHC: 31.6 g/dL (ref 30.0–36.0)
MCV: 82.4 fL (ref 78.0–100.0)
Platelets: 364 10*3/uL (ref 150–400)
RBC: 3.92 MIL/uL (ref 3.87–5.11)
RDW: 18.7 % — AB (ref 11.5–15.5)
WBC: 11 10*3/uL — ABNORMAL HIGH (ref 4.0–10.5)

## 2014-02-02 MED ORDER — ZOLPIDEM TARTRATE 5 MG PO TABS: 5.0000 mg | ORAL_TABLET | Freq: Every evening | ORAL | Status: DC | PRN

## 2014-02-02 NOTE — Discharge Instructions (Signed)
° °  Home health will be provided by [facility] 336 [phone]

## 2014-02-02 NOTE — Progress Notes (Signed)
9 Days Post-Op  Subjective: Continues to improve No SOB Off O2 Tolerating po  Objective: Vital signs in last 24 hours: Temp:  [98.1 F (36.7 C)-98.6 F (37 C)] 98.3 F (36.8 C) (01/14 0524) Pulse Rate:  [94-103] 103 (01/14 0524) Resp:  [16-20] 20 (01/14 0524) BP: (110-134)/(47-56) 134/47 mmHg (01/14 0524) SpO2:  [95 %-100 %] 95 % (01/14 0524) Last BM Date: 02/01/14  Intake/Output from previous day: 01/13 0701 - 01/14 0700 In: 726.3 [P.O.:118; I.V.:458.3; IV Piggyback:150] Out: 400 [Urine:400] Intake/Output this shift:    Incision clean Abdomen soft, non tender Lungs clear  Lab Results:   Recent Labs  01/31/14 0500 02/02/14 0527  WBC 7.0 11.0*  HGB 8.0* 10.2*  HCT 25.2* 32.3*  PLT 242 364   BMET  Recent Labs  02/01/14 0440 02/02/14 0527  NA 137 145  K 3.8 3.7  CL 108 107  CO2 25 21  GLUCOSE 101* 93  BUN <5* <5*  CREATININE 0.50 0.61  CALCIUM 7.9* 8.8   PT/INR No results for input(s): LABPROT, INR in the last 72 hours. ABG No results for input(s): PHART, HCO3 in the last 72 hours.  Invalid input(s): PCO2, PO2  Studies/Results: Dg Chest Port 1 View  02/01/2014   CLINICAL DATA:  Shortness of breath, respiratory failure  EXAM: PORTABLE CHEST - 1 VIEW  COMPARISON:  Portable chest x-ray of 01/30/2014  FINDINGS: There is persistent pulmonary vascular congestion. However with prominent markings at both lung bases and probable effusions, pneumonia cannot be excluded. Cardiomegaly is stable. The right IJ central venous line is unchanged in position. The endotracheal tube is no longer seen.  IMPRESSION: Endotracheal tube not visualized. Little change in pulmonary vascular congestion and effusions. Cannot exclude lower lobe pneumonia left-greater-than-right.   Electronically Signed   By: Ivar Drape M.D.   On: 02/01/2014 08:16    Anti-infectives: Anti-infectives    Start     Dose/Rate Route Frequency Ordered Stop   01/29/14 0300  vancomycin (VANCOCIN) 500 mg  in sodium chloride 0.9 % 100 mL IVPB  Status:  Discontinued     500 mg100 mL/hr over 60 Minutes Intravenous Every 12 hours 01/28/14 1446 01/30/14 1158   01/28/14 2300  piperacillin-tazobactam (ZOSYN) IVPB 3.375 g     3.375 g12.5 mL/hr over 240 Minutes Intravenous Every 8 hours 01/28/14 1446     01/28/14 1500  piperacillin-tazobactam (ZOSYN) IVPB 3.375 g     3.375 g100 mL/hr over 30 Minutes Intravenous  Once 01/28/14 1446 01/28/14 1730   01/28/14 1500  vancomycin (VANCOCIN) IVPB 1000 mg/200 mL premix     1,000 mg200 mL/hr over 60 Minutes Intravenous  Once 01/28/14 1446 01/28/14 1811   01/24/14 0600  ceFAZolin (ANCEF) IVPB 2 g/50 mL premix     2 g100 mL/hr over 30 Minutes Intravenous On call to O.R. 01/23/14 1419 01/24/14 1321   01/24/14 0600  metroNIDAZOLE (FLAGYL) IVPB 500 mg     500 mg100 mL/hr over 60 Minutes Intravenous On call to O.R. 01/23/14 1419 01/25/14 0559      Assessment/Plan: s/p Procedure(s): COLOSTOMY TAKEDOWN (N/A)  Hopefully home tomorrow with home health Will stop abx tomorrow   LOS: 9 days    [name] A 02/02/2014

## 2014-02-02 NOTE — Progress Notes (Signed)
Physical Therapy Treatment Patient Details Name: [name] MRN: [id number] DOB: 11/04/1940 Today's Date: 02/02/2014    History of Present Illness Patient is a 74 y/o female s/p colostomy takedown 1/6. Post-op course was unremarkable until 1/9 when she was emergently transferred to the ICU w/ acute respiratory distress, new fever, delirium and hgb drop from 8.1 to 6.7 and intubated. Extubated 1/11. PMH of CAD, dvt, copd, stroke, HTN, trach 07/2013 (not currently), arthritis    PT Comments    Pt. Would benefit from more gait and stair training and review the abdominal precautions. Pt. Was limited by fatigue and lethargic due to pain medication. Husband will do transfers and self care activities on d/c until patient can become more independent.  Follow Up Recommendations  Home health PT;Supervision/Assistance - 24 hour     Equipment Recommendations  None recommended by PT    Recommendations for Other Services       Precautions / Restrictions Precautions Precautions: Fall Restrictions Weight Bearing Restrictions: No    Mobility  Bed Mobility Overal bed mobility: Needs Assistance Bed Mobility: Rolling;Sidelying to Sit Rolling: Min guard Sidelying to sit: HOB elevated;Min assist       General bed mobility comments: min guard for safety, min A for LEs. cues for log roll technique  Transfers Overall transfer level: Needs assistance Equipment used: Rolling walker (2 wheeled) Transfers: Sit to/from Stand Sit to Stand: Min guard         General transfer comment: Min guard for safety. Increased effort to stand. Cues for hand placement.  Ambulation/Gait Ambulation/Gait assistance: Min assist Ambulation Distance (Feet): 150 Feet Assistive device: Rolling walker (2 wheeled) Gait Pattern/deviations: Step-through pattern;Trunk flexed;Decreased stride length Gait velocity: decreased Gait velocity interpretation: Below normal speed for age/gender General Gait Details:  Pt. with slow, steady gait. Had trouble controlling the walker and staying too far away from it. Gave verbal cues to correct. Tactile cues for posture.     Stairs            Wheelchair Mobility    Modified Rankin (Stroke Patients Only)       Balance                                    Cognition Arousal/Alertness: Awake/alert Behavior During Therapy: WFL for tasks assessed/performed Overall Cognitive Status: Within Functional Limits for tasks assessed                      Exercises      General Comments        Pertinent Vitals/Pain Pain Score: 4  Pain Location: abdomen Pain Descriptors / Indicators: Sore Pain Intervention(s): Premedicated before session;Monitored during session    Home Living                      Prior Function            PT Goals (current goals can now be found in the care plan section) Progress towards PT goals: Progressing toward goals    Frequency  Min 3X/week    PT Plan Current plan remains appropriate    Co-evaluation             End of Session Equipment Utilized During Treatment: Gait belt Activity Tolerance: Patient limited by fatigue Patient left: in bed;with family/visitor present;with call bell/phone within reach     Time: 1410-1434 PT Time Calculation (  min) (ACUTE ONLY): 24 min  Charges:  $Gait Training: 8-22 mins $Therapeutic Activity: 8-22 mins                    G Codes:      Jodi Geralds, [place] 02/02/2014, 2:50 PM

## 2014-02-02 NOTE — Progress Notes (Signed)
Follow up visit to patient to discuss [facility] services. Discussed [facility] services with both patient and husband. Consents obtained. Explained that [facility] will not replace or interfere with services provided by home health. Patient will receive post hospital discharge calls and will be evaluated for monthly home visits. Will make inpatient RNCM aware.  Marthenia Rolling, MSN- [facility] [phone]

## 2014-02-03 LAB — CULTURE, BLOOD (ROUTINE X 2)
Culture: NO GROWTH
Culture: NO GROWTH

## 2014-02-03 MED ORDER — OXYCODONE-ACETAMINOPHEN 5-325 MG PO TABS: 1.0000 | ORAL_TABLET | ORAL | Status: DC | PRN

## 2014-02-03 NOTE — Progress Notes (Signed)
Patient discharged to home with instructions, staples removed as ordered, 1/2 inch steri applied and covered with dry dressing.

## 2014-02-03 NOTE — Progress Notes (Signed)
Patient ID: [name], female   DOB: 1940-09-22, 74 y.o.   MRN: [id number]  Doing well today No complaints Tolerating po Ambulating Having BM's  Abdomen soft  Plan:  Discharge with home health

## 2014-02-03 NOTE — Discharge Summary (Signed)
Physician Discharge Summary  Patient ID: [name] MRN: [id number] DOB/AGE: May 22, 1940 74 y.o.  Admit date: 01/24/2014 Discharge date: 02/03/2014  Admission Diagnoses:  Discharge Diagnoses:  Active Problems:   S/P colostomy takedown   Acute respiratory failure possible aspiration Acute on chronic blood loss anemia  Discharged Condition: good  Hospital Course: She was admitted for an elective colostomy takedown.  She did well initially post op but developed an ileus.  She the developed acute respiratory failure.  She was on the Entereg protocol but this had to be stopped and an NG placed.  She was transferred to step down.  Initially, her respiratory status improved, but she subsequently worsened requiring transfer to the ICU and intubuation.  CCM and cardiology were consulted.  She was placed on antibiotics IV.  She improved quickly after about two days on the vent and was then extubated.  She required transfusion of PRBC's for acute on chronic blood loss anemia.  It was suspected she may have aspirated.  She continued to quickly improved and was transferred to the floor.  By the time of discharge, she was tolerating a diet, ambulating with PT, was off oxygen, having BM's and doing well  Consults: cardiology and pulmonary/intensive care  Significant Diagnostic Studies:   Treatments: surgery: colostomy takedown  Discharge Exam: Blood pressure 141/57, pulse 87, temperature 98.3 F (36.8 C), temperature source Oral, resp. rate 17, height 5' (1.524 m), weight 147 lb 4.3 oz (66.8 kg), SpO2 94 %. General appearance: alert, cooperative and no distress Resp: clear to auscultation bilaterally Cardio: regular rate and rhythm, S1, S2 normal, no murmur, click, rub or gallop Incision/Wound:incision clean with loose approximation, no evidence of infection  Disposition: 01-Home or Self Care     Medication List    TAKE these medications        aspirin EC 81 MG tablet  Take 162 mg by  mouth daily.     atorvastatin 80 MG tablet  Commonly known as:  LIPITOR  Take 80 mg by mouth every evening.     fish oil-omega-3 fatty acids 1000 MG capsule  Take 1,000 mg by mouth daily.     folic acid 509 MCG tablet  Commonly known as:  FOLVITE  Take 800 mcg by mouth daily.     ipratropium-albuterol 0.5-2.5 (3) MG/3ML Soln  Commonly known as:  DUONEB  Take 3 mLs by nebulization every 6 (six) hours as needed (for shortness of breath).     metoprolol succinate 25 MG 24 hr tablet  Commonly known as:  TOPROL-XL  TAKE 1 TABLET BY MOUTH DAILY     mirtazapine 15 MG tablet  Commonly known as:  REMERON  Take 15 mg by mouth at bedtime.     nitroGLYCERIN 0.4 MG SL tablet  Commonly known as:  NITROSTAT  Place 1 tablet (0.4 mg total) under the tongue every 5 (five) minutes as needed for chest pain.     ondansetron 4 MG tablet  Commonly known as:  ZOFRAN  Take 4 mg by mouth every 6 (six) hours as needed for nausea or vomiting.     oxyCODONE-acetaminophen 5-325 MG per tablet  Commonly known as:  PERCOCET/ROXICET  Take 1-2 tablets by mouth every 4 (four) hours as needed for moderate pain.     oxyCODONE-acetaminophen 5-325 MG per tablet  Commonly known as:  PERCOCET/ROXICET  Take 1-2 tablets by mouth every 4 (four) hours as needed for moderate pain.     potassium chloride SA 20 MEQ  tablet  Commonly known as:  K-DUR,KLOR-CON  Take 20 mEq by mouth 3 (three) times daily.     zaleplon 10 MG capsule  Commonly known as:  SONATA  Take 20 mg by mouth at bedtime.           Follow-up Information    Follow up with [facility], MD. Schedule an appointment as soon as possible for a visit in 3 weeks.   Specialty:  General Surgery   Contact information:   [address] [place] [address] [phone]       Signed: Harl Bowie 02/03/2014, 6:35 AM

## 2014-03-01 ENCOUNTER — Other Ambulatory Visit: Payer: Self-pay | Admitting: Cardiology

## 2014-03-28 ENCOUNTER — Other Ambulatory Visit: Payer: Self-pay | Admitting: Cardiology

## 2014-03-28 ENCOUNTER — Other Ambulatory Visit: Payer: Self-pay | Admitting: Radiology

## 2014-03-28 DIAGNOSIS — I6523 Occlusion and stenosis of bilateral carotid arteries: Secondary | ICD-10-CM

## 2014-04-03 ENCOUNTER — Ambulatory Visit: Payer: Commercial Managed Care - HMO | Admitting: Cardiology

## 2014-04-06 ENCOUNTER — Ambulatory Visit (HOSPITAL_COMMUNITY): Payer: Commercial Managed Care - HMO | Attending: Internal Medicine | Admitting: Cardiology

## 2014-04-06 DIAGNOSIS — I6523 Occlusion and stenosis of bilateral carotid arteries: Secondary | ICD-10-CM | POA: Diagnosis present

## 2014-04-06 DIAGNOSIS — R200 Anesthesia of skin: Secondary | ICD-10-CM | POA: Diagnosis not present

## 2014-04-06 DIAGNOSIS — R202 Paresthesia of skin: Secondary | ICD-10-CM

## 2014-04-06 NOTE — Progress Notes (Signed)
Carotid duplex performed 

## 2014-04-11 ENCOUNTER — Telehealth: Payer: Self-pay | Admitting: *Deleted

## 2014-04-11 DIAGNOSIS — R200 Anesthesia of skin: Secondary | ICD-10-CM

## 2014-04-11 DIAGNOSIS — I6523 Occlusion and stenosis of bilateral carotid arteries: Secondary | ICD-10-CM

## 2014-04-11 NOTE — Telephone Encounter (Signed)
-----   Message from Dorothy Spark, MD sent at 04/10/2014 10:44 AM EDT ----- She has stable stenosis, however borderline severe, she needs to notify us if she notices any dizziness, stroke like symptoms. Otherwise we will repeat another B/L carotid US in 6 months.

## 2014-04-11 NOTE — Telephone Encounter (Signed)
Informed the pt that I made Dr Meda Coffee aware of her current symptoms of left arm numbness that comes and goes for the past 4 weeks, and per Dr Meda Coffee, she would like to refer the pt to vascular surgery for evaluation of moderate to severe carotid stenosis with symptoms of left arm numbness.  Informed the pt that I will send a message to our [facility] to schedule this appt for the pt.  Pt verbalized understanding and agrees with this plan.

## 2014-04-11 NOTE — Telephone Encounter (Signed)
Contacted the pt to inform her that per Dr Meda Coffee her carotid duplex showed stable stenosis, however borderline severe. Informed the pt that per Dr Meda Coffee she wants the pt to inform us if she notices any dizziness, or any stroke-like symptoms.  Informed the pt that per Dr Meda Coffee she would like to repeat another B/L [place] in 6 months.  Informed the pt that I will send our schedulers a message to contact the pt to schedule this for 6 months out.  Pt verbalized understanding and agrees with this plan.   Pt did want Dr Meda Coffee to know that the only symptoms she is experiencing is numbness in her left arm that comes and goes, for the past 4-6 weeks.  Will route this message to Dr Meda Coffee to make her aware of pts complaints.

## 2014-04-17 ENCOUNTER — Telehealth: Payer: Self-pay | Admitting: Cardiology

## 2014-04-17 NOTE — Telephone Encounter (Signed)
New Message  Pt calling to speak w/ Rn. Pt is aware of 5/6 appt but wants to speak w/ Rn. Please call back and discuss.

## 2014-04-17 NOTE — Telephone Encounter (Signed)
Spoke with Hinton Dyer at [facility] and appoint

## 2014-04-17 NOTE — Telephone Encounter (Signed)
Spoke with patient and and she thought she needed something scheduled at the hospital she discussed with Karlene Einstein a couple of weeks ago. Did see that patient needed appointment with [facility] Appointment has been made with Dr Oneida Alar for April 7 arrive at 9:00 for a 9:15 Advised patient. Will forward to RN with Dr Meda Coffee to make sure no other appointments needing to be scheduled

## 2014-04-18 NOTE — Telephone Encounter (Signed)
Pt aware of future appts scheduled with no additional questions.  Pt agrees with future appts and plans.  Pt very appreciative for all the help provided and the continuity of care.

## 2014-04-26 ENCOUNTER — Encounter: Payer: Self-pay | Admitting: Vascular Surgery

## 2014-04-27 ENCOUNTER — Encounter: Payer: Self-pay | Admitting: Vascular Surgery

## 2014-04-27 ENCOUNTER — Ambulatory Visit (INDEPENDENT_AMBULATORY_CARE_PROVIDER_SITE_OTHER): Payer: Commercial Managed Care - HMO | Admitting: Vascular Surgery

## 2014-04-27 VITALS — BP 116/49 | HR 80 | Ht 60.0 in | Wt 140.8 lb

## 2014-04-27 DIAGNOSIS — I6523 Occlusion and stenosis of bilateral carotid arteries: Secondary | ICD-10-CM

## 2014-04-27 DIAGNOSIS — M25512 Pain in left shoulder: Secondary | ICD-10-CM

## 2014-04-27 NOTE — Progress Notes (Signed)
[facility] [place] HISTORY AND PHYSICAL   History of Present Illness:  Patient is a 74 y.o. year old female who presents for evaluation of bilateral internal carotid artery stenosis. She has intermittently had problems with numbness and tingling in her left arm. These will last 3-4 minutes. They always occur when she is sitting primarily in the evenings. This has been occurring every other day for approximately one month. She states she develops numbness that extends down the arm from the shoulder all the way to her fingertips. She does not describe any real muscle weakness. She states she occasionally has some left leg numbness. She states that this primarily is associated with her knee hip and shoulder. She denies any symptoms of classic TIA amaurosis or stroke. She is currently on aspirin once daily. She had a lengthy protracted hospital stay in the summer and fall after a bowel resection. She had a tracheostomy temporarily. She had a lengthy intensive care unit today.  Other medical problems include  coronary artery disease, Barrett's esophagus, hypertension, mild depression. All of these are currently stable. She is a current smoker.  Past Medical History  Diagnosis Date  . Carotid artery disease     a. 60-70% bilat ICA stenosis by dopplers 05/2012.  Marland Kitchen Other and unspecified hyperlipidemia     takes Lipitor daily  . CAD (coronary artery disease)     a. Mod dz 2010 initially mgd medically. b. 12/2012 - angina s/p PTCA/DES to mid-circumflex, PTCA/DES to first OM.   Marland Kitchen Barrett's esophagus   . SBO (small bowel obstruction)     a. Lysis of adhesions and ovarian cystectomy 04/2013 for sBO. b. Admitted 07/2013 for colonic obstruction due to suspected colitis, s/p partial colectomy/colostomy 08/01/13. Admission complicated by anasarca, acute resp failure requiring tracheostomy, decannulated 08/25/13. c. Recurrent SBO8/2015, NGT placed for decompression.   . Colonic obstruction due to  suspected colitis; s/p colectomy/colostomy     a. See SBO.  Marland Kitchen Protein-calorie malnutrition, severe w/ electrolyte imbalance     a. Severe hypoalbuminemia leading to 3rd spacing including anasarca and pulm edema 07/2013.  Marland Kitchen DVT of axillary vein, acute right     a. Dx 07/2013 felt due to R IJ central line that had been inserted 7/14. Not on anticoag due to GIB issues and small cerebral bleed.  . Acute respiratory failure with hypoxia s/p tracheostomy 07/2013  . Intracranial hemorrhage     a. 08/11/13 - per DC summary, tiny SAH vs SDH done for altered mentation, anticoag stopped including aspirin.  . Anemia     a. 7-08/2013 felt due to recent critical illness/chronic disease.  . Complication of anesthesia     hard to wake up from anesthesia   . Hypertension     takes Metoprolol daily  . HTN (hypertension)   . Heart murmur   . COPD (chronic obstructive pulmonary disease)   . Pneumonia     hx of->15 yrs ago  . History of bronchitis     > 31yrs ago   . Dizziness     was on Bentyl which caused this-took off of it and no problems since  . Stroke early 80's    right sided weakness--TIA  . Arthritis   . Joint pain   . Chronic back pain     deteriorating;DDD  . Neck pain     DDD  . History of hiatal hernia   . History of colon polyps   . History of kidney stones  was told it was stable but doesn't know for sure that she ever passed it  . Depression     takes Remeron and Zoloft daily    Past Surgical History  Procedure Laterality Date  . Appendectomy    . Cholecystectomy    . Colon resection    . 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 Rotator cuff repair Left   . Abdominal hysterectomy    . Colon surgery    . Foot surgery Left   . Hand surgery Bilateral   . Laparotomy N/A 08/01/2013    Procedure: EXPLORATORY LAPAROTOMY;  Surgeon: Harl Bowie, MD;  Location: [place];  Service: General;  Laterality: N/A;  . Partial colectomy  N/A 08/01/2013    Procedure: PARTIAL COLECTOMY;  Surgeon: Harl Bowie, MD;  Location: [place];  Service: General;  Laterality: N/A;  . Colostomy N/A 08/01/2013    Procedure: COLOSTOMY;  Surgeon: Harl Bowie, MD;  Location: [place];  Service: General;  Laterality: N/A;  . Bowel resection N/A 08/01/2013    Procedure: SMALL BOWEL RESECTION;  Surgeon: Harl Bowie, MD;  Location: [place];  Service: General;  Laterality: N/A;  . Tracheostomy      feinstein  . Left heart catheterization with coronary angiogram N/A 12/20/2012    Procedure: LEFT HEART CATHETERIZATION WITH CORONARY ANGIOGRAM;  Surgeon: Blane Ohara, MD;  Location: [facility] CATH LAB;  Service: Cardiovascular;  Laterality: N/A;  . Cataract surgery Bilateral   . Colostomy takedown  01/24/2014    dr Ninfa Linden  . Colostomy takedown N/A 01/24/2014    Procedure: COLOSTOMY TAKEDOWN;  Surgeon: Coralie Keens, MD;  Location: [facility] OR;  Service: General;  Laterality: N/A;    Social History History  Substance Use Topics  . Smoking status: Heavy Tobacco Smoker -- 0.50 packs/day for 53 years    Types: Cigarettes  . Smokeless tobacco: Never Used     Comment: also uses e cig  . Alcohol Use: No    Family History Family History  Problem Relation Age of Onset  . Heart disease    . Varicose Veins Mother   . Heart disease Father   . Heart attack Father   . AAA (abdominal aortic aneurysm) Father     Allergies  Allergies  Allergen Reactions  . Pregabalin Swelling    Tongue swelling  . Sulfonamide Derivatives Swelling    Childhood reaction     Current Outpatient Prescriptions  Medication Sig Dispense Refill  . aspirin EC 81 MG tablet Take 162 mg by mouth daily.    Marland Kitchen atorvastatin (LIPITOR) 80 MG tablet Take 80 mg by mouth every evening.     . fish oil-omega-3 fatty acids 1000 MG capsule Take 1,000 mg by mouth daily.     . folic acid (FOLVITE) 989 MCG tablet Take 800 mcg by mouth daily.     Marland Kitchen ipratropium-albuterol (DUONEB)  0.5-2.5 (3) MG/3ML SOLN Take 3 mLs by nebulization every 6 (six) hours as needed (for shortness of breath).    . metoprolol succinate (TOPROL-XL) 25 MG 24 hr tablet TAKE 1 TABLET BY MOUTH DAILY 90 tablet 3  . mirtazapine (REMERON) 15 MG tablet Take 15 mg by mouth at bedtime.    . nitroGLYCERIN (NITROSTAT) 0.4 MG SL tablet Place 1 tablet (0.4 mg total) under the tongue every 5 (five) minutes as needed for chest pain. 25 tablet 3  .  ondansetron (ZOFRAN) 4 MG tablet Take 4 mg by mouth every 6 (six) hours as needed for nausea or vomiting.     Marland Kitchen oxyCODONE-acetaminophen (PERCOCET/ROXICET) 5-325 MG per tablet Take 1-2 tablets by mouth every 4 (four) hours as needed for moderate pain.     Marland Kitchen oxyCODONE-acetaminophen (PERCOCET/ROXICET) 5-325 MG per tablet Take 1-2 tablets by mouth every 4 (four) hours as needed for moderate pain. 40 tablet 0  . pantoprazole (PROTONIX) 40 MG tablet TAKE 1 TABLET TWICE DAILY 180 tablet 1  . potassium chloride SA (K-DUR,KLOR-CON) 20 MEQ tablet Take 20 mEq by mouth 3 (three) times daily.    . zaleplon (SONATA) 10 MG capsule Take 20 mg by mouth at bedtime.      No current facility-administered medications for this visit.    ROS:   General:  No weight loss, Fever, chills  HEENT: No recent headaches, no nasal bleeding, no visual changes, no sore throat  Neurologic: No dizziness, blackouts, seizures. No recent symptoms of stroke or mini- stroke. No recent episodes of slurred speech, or temporary blindness.  Cardiac: No recent episodes of chest pain/pressure, no shortness of breath at rest.  No shortness of breath with exertion.  Denies history of atrial fibrillation or irregular heartbeat  Vascular: No history of rest pain in feet.  No history of claudication.  No history of non-healing ulcer, No history of DVT   Pulmonary: No home oxygen, no productive cough, no hemoptysis,  No asthma or wheezing  Musculoskeletal:  [ ]  Arthritis, [ ]  Low back pain,  [ ]  Joint  pain  Hematologic:No history of hypercoagulable state.  No history of easy bleeding.  No history of anemia  Gastrointestinal: No hematochezia or melena,  No gastroesophageal reflux, no trouble swallowing  Urinary: [ ]  chronic Kidney disease, [ ]  on HD - [ ]  MWF or [ ]  TTHS, [ ]  Burning with urination, [ ]  Frequent urination, [ ]  Difficulty urinating;   Skin: No rashes  Psychological: No history of anxiety,  No history of depression   Physical Examination  Filed Vitals:   04/27/14 0843 04/27/14 0845  BP: 120/48 116/49  Pulse: 79 80  Height: 5' (1.524 m)   Weight: 140 lb 12.8 oz (63.866 kg)   SpO2: 100%     Body mass index is 27.5 kg/(m^2).  General:  Alert and oriented, no acute distress HEENT: Normal Neck: No bruit or JVD Pulmonary: Clear to auscultation bilaterally Cardiac: Regular Rate and Rhythm without murmur Abdomen: Soft, non-tender, non-distended, no mass Skin: No rash Extremity Pulses:  2+ radial, brachial, femoral pulses bilaterally Musculoskeletal: No deformity or edema, no real point tenderness, she does have some pain when trying to fully abduct the left shoulder   Neurologic: Upper and lower extremity motor 5/5 and symmetric  DATA:  I reviewed the results for carotid duplex scan from March 17. This shows bilateral 60-80% carotid stenosis. On the right side velocities were 262/94, left side were 208/46.  Assessment:  bilateral moderate carotid stenosis. The patient has had intermittent numbness and tingling in her left upper extremity. However her symptoms were not completely classic for TIA. She does have a moderate right internal carotid artery stenosis. I am suspicious on whether or not this is more musculoskeletal neuropraxia rather than TIA.    PLAN:  I will schedule the patient to see Dr. Rhona Raider for further evaluation of an orthopedic issue causing the numbness and tingling. If Dr. Rhona Raider does not seem to think this is related to  a musculoskeletal issue  and we will consider her for right carotid endarterectomy. If we consider right carotid endarterectomy she would need an ENT evaluation prior to carotid endarterectomy to make sure of her baseline vocal cord function and light of her previous tracheostomy.  She will continue her aspirin daily.   Ruta Hinds, MD [facility] [place] Office: [phone] Pager: [phone]

## 2014-04-27 NOTE — Addendum Note (Signed)
Addended by: Mena Goes on: 04/27/2014 03:49 PM   Modules accepted: Orders

## 2014-05-03 ENCOUNTER — Ambulatory Visit: Payer: Commercial Managed Care - HMO | Admitting: Cardiology

## 2014-05-26 ENCOUNTER — Ambulatory Visit: Payer: Commercial Managed Care - HMO | Admitting: Cardiology

## 2014-06-01 ENCOUNTER — Ambulatory Visit: Payer: Commercial Managed Care - HMO | Admitting: Vascular Surgery

## 2014-06-02 ENCOUNTER — Encounter: Payer: Self-pay | Admitting: Cardiology

## 2014-06-02 ENCOUNTER — Ambulatory Visit (INDEPENDENT_AMBULATORY_CARE_PROVIDER_SITE_OTHER): Payer: Commercial Managed Care - HMO | Admitting: Cardiology

## 2014-06-02 VITALS — BP 128/68 | HR 76 | Ht 60.0 in | Wt 143.0 lb

## 2014-06-02 DIAGNOSIS — I2510 Atherosclerotic heart disease of native coronary artery without angina pectoris: Secondary | ICD-10-CM

## 2014-06-02 DIAGNOSIS — E785 Hyperlipidemia, unspecified: Secondary | ICD-10-CM

## 2014-06-02 DIAGNOSIS — I10 Essential (primary) hypertension: Secondary | ICD-10-CM | POA: Diagnosis not present

## 2014-06-02 DIAGNOSIS — I5032 Chronic diastolic (congestive) heart failure: Secondary | ICD-10-CM

## 2014-06-02 DIAGNOSIS — I2583 Coronary atherosclerosis due to lipid rich plaque: Secondary | ICD-10-CM

## 2014-06-02 DIAGNOSIS — I739 Peripheral vascular disease, unspecified: Principal | ICD-10-CM

## 2014-06-02 DIAGNOSIS — I779 Disorder of arteries and arterioles, unspecified: Secondary | ICD-10-CM

## 2014-06-02 NOTE — Patient Instructions (Signed)
Your physician recommends that you continue on your current medications as directed. Please refer to the Current Medication list given to you today.    Your physician recommends that you schedule a follow-up appointment in: 3 MONTHS WITH DR NELSON  

## 2014-06-02 NOTE — Progress Notes (Signed)
Patient ID: [name], female   DOB: 10-31-1940, 74 y.o.   MRN: [id number]    Patient Name: [name] Date of Encounter: 06/02/2014  Primary Care Provider:  Laverna Peace, NP Primary Cardiologist:  Dorothy Spark  Problem List   Past Medical History  Diagnosis Date  . Carotid artery disease     a. 60-70% bilat ICA stenosis by dopplers 05/2012.  Marland Kitchen Other and unspecified hyperlipidemia     takes Lipitor daily  . CAD (coronary artery disease)     a. Mod dz 2010 initially mgd medically. b. 12/2012 - angina s/p PTCA/DES to mid-circumflex, PTCA/DES to first OM.   Marland Kitchen Barrett's esophagus   . SBO (small bowel obstruction)     a. Lysis of adhesions and ovarian cystectomy 04/2013 for sBO. b. Admitted 07/2013 for colonic obstruction due to suspected colitis, s/p partial colectomy/colostomy 08/01/13. Admission complicated by anasarca, acute resp failure requiring tracheostomy, decannulated 08/25/13. c. Recurrent SBO8/2015, NGT placed for decompression.   . Colonic obstruction due to suspected colitis; s/p colectomy/colostomy     a. See SBO.  Marland Kitchen Protein-calorie malnutrition, severe w/ electrolyte imbalance     a. Severe hypoalbuminemia leading to 3rd spacing including anasarca and pulm edema 07/2013.  Marland Kitchen DVT of axillary vein, acute right     a. Dx 07/2013 felt due to R IJ central line that had been inserted 7/14. Not on anticoag due to GIB issues and small cerebral bleed.  . Acute respiratory failure with hypoxia s/p tracheostomy 07/2013  . Intracranial hemorrhage     a. 08/11/13 - per DC summary, tiny SAH vs SDH done for altered mentation, anticoag stopped including aspirin.  . Anemia     a. 7-08/2013 felt due to recent critical illness/chronic disease.  . Complication of anesthesia     hard to wake up from anesthesia   . Hypertension     takes Metoprolol daily  . HTN (hypertension)   . Heart murmur   . COPD (chronic obstructive pulmonary disease)   . Pneumonia     hx of->15 yrs ago  .  History of bronchitis     > 103yr ago   . Dizziness     was on Bentyl which caused this-took off of it and no problems since  . Stroke early 80's    right sided weakness--TIA  . Arthritis   . Joint pain   . Chronic back pain     deteriorating;DDD  . Neck pain     DDD  . History of hiatal hernia   . History of colon polyps   . History of kidney stones     was told it was stable but doesn't know for sure that she ever passed it  . Depression     takes Remeron and Zoloft daily   Past Surgical History  Procedure Laterality Date  . Appendectomy    . Cholecystectomy    . Colon resection    . 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 Rotator cuff repair Left   . Abdominal hysterectomy    . Colon surgery    . Foot surgery Left   . Hand surgery Bilateral   . Laparotomy N/A 08/01/2013    Procedure: EXPLORATORY LAPAROTOMY;  Surgeon: DHarl Bowie MD;  Location: [place]  Service: General;  Laterality: N/A;  . Partial colectomy N/A 08/01/2013    Procedure: PARTIAL  COLECTOMY;  Surgeon: Harl Bowie, MD;  Location: [place];  Service: General;  Laterality: N/A;  . Colostomy N/A 08/01/2013    Procedure: COLOSTOMY;  Surgeon: Harl Bowie, MD;  Location: [place];  Service: General;  Laterality: N/A;  . Bowel resection N/A 08/01/2013    Procedure: SMALL BOWEL RESECTION;  Surgeon: Harl Bowie, MD;  Location: [place];  Service: General;  Laterality: N/A;  . Tracheostomy      feinstein  . Left heart catheterization with coronary angiogram N/A 12/20/2012    Procedure: LEFT HEART CATHETERIZATION WITH CORONARY ANGIOGRAM;  Surgeon: Blane Ohara, MD;  Location: [facility] CATH LAB;  Service: Cardiovascular;  Laterality: N/A;  . Cataract surgery Bilateral   . Colostomy takedown  01/24/2014    dr Ninfa Linden  . Colostomy takedown N/A 01/24/2014    Procedure: COLOSTOMY TAKEDOWN;  Surgeon: Coralie Keens, MD;  Location: [place];  Service: General;   Laterality: N/A;    Allergies  Allergies  Allergen Reactions  . Pregabalin Swelling    Tongue swelling  . Sulfonamide Derivatives Swelling    Childhood reaction   HPI  [name] was previously followed by Dr Verl Blalock. She is a pleasant 74 year old female with h/o hyperlipidemia, hypertension, PAD and CAD who returns today for a 1 year follow up. The patient is a lifelong smoker.  She had worsening exertional CP and DOE in November 2014, underwent a cardiac cath on 12/20/2012 with finding of severe stenosis in OM1 and received 2 DES. She was doing well from cardiac standpoint. In May 2015 had a SBO and required partial colon resection and now has a colostomy bag, lost 30 lbs.   She has been experiencing LE edema and SOB since then, got worse 3 weeks ago. She was seen in the ER and scheduled for an outpatient Lexiscan stress that was normal. She was prescribed 20 mg PO Lasix that helped mildly. LE edema worse toward the end of day. No orthopnea, PND. BNP 300, CXR no congestion. She is complaining of significant bruising.  01/09/2014 - she reports that her LE edema has resolved with increased lasix dose. She was taken off lasix , her potassium was low and she was started on 20 mEq of KCL, she she was taking every other day. She is awaiting colostomy reconstruction surgery on January 24 2014. No chest pain or SOB, just feels tired all the time.   06/02/2014 - the patient is coming after 6 months, in the meantime she underwent elective colostomy with no complications, she has been experiencing left shoulder pain and was seen by Dr Eden Lathe, who recommended an orthopedic surgeon. She has obtained a steroid injection yesterday. Denies chest pain or DOE. No LE edema, orthopnea, palpitations or syncope.   Home Medications  Prior to Admission medications   Medication Sig Start Date End Date Taking? Authorizing Provider  aspirin 81 MG tablet Take 81 mg by mouth 2 (two) times daily.    Yes Historical  Provider, MD  atorvastatin (LIPITOR) 80 MG tablet Take 80 mg by mouth daily.   Yes Historical Provider, MD  dicyclomine (BENTYL) 10 MG capsule as directed. 08/20/10  Yes Historical Provider, MD  diphenhydramine-acetaminophen (TYLENOL PM) 25-500 MG TABS Take 1 tablet by mouth at bedtime as needed.     Yes Historical Provider, MD  fish oil-omega-3 fatty acids 1000 MG capsule Take 1 g by mouth daily.     Yes Historical Provider, MD  folic acid (FOLVITE) 517 MCG tablet  Take 400 mcg by mouth daily.     Yes Historical Provider, MD  metoprolol succinate (TOPROL-XL) 25 MG 24 hr tablet TAKE 1 TABLET ONCE DAILY. 05/04/12  Yes Renella Cunas, MD  nitroGLYCERIN (NITROSTAT) 0.4 MG SL tablet Place 0.4 mg under the tongue every 5 (five) minutes as needed.     Yes Historical Provider, MD  omeprazole (PRILOSEC) 20 MG capsule Take 20 mg by mouth 2 (two) times daily.     Yes Historical Provider, MD  sertraline (ZOLOFT) 100 MG tablet Take 150 mg by mouth daily.    Yes Historical Provider, MD    Family History  Family History  Problem Relation Age of Onset  . Heart disease    . Varicose Veins Mother   . Heart disease Father   . Heart attack Father   . AAA (abdominal aortic aneurysm) Father     Social History  History   Social History  . Marital Status: Married    Spouse Name: N/A  . Number of Children: N/A  . Years of Education: N/A   Occupational History  . full time    Social History Main Topics  . Smoking status: Heavy Tobacco Smoker -- 0.50 packs/day for 53 years    Types: Cigarettes  . Smokeless tobacco: Never Used     Comment: also uses e cig  . Alcohol Use: No  . Drug Use: No  . Sexual Activity: Not Currently    Birth Control/ Protection: Surgical   Other Topics Concern  . Not on file   Social History Narrative     Review of Systems, as per HPI, otherwise negative General:  No chills, fever, night sweats or weight changes.  Cardiovascular:  No chest pain, dyspnea on exertion,  edema, orthopnea, palpitations, paroxysmal nocturnal dyspnea. Dermatological: No rash, lesions/masses Respiratory: No cough, dyspnea Urologic: No hematuria, dysuria Abdominal:   No nausea, vomiting, diarrhea, bright red blood per rectum, melena, or hematemesis Neurologic:  No visual changes, wkns, changes in mental status. All other systems reviewed and are otherwise negative except as noted above.  Physical Exam  Blood pressure 128/68, pulse 76, height 5' (1.524 m), weight 143 lb (64.864 kg), SpO2 99 %.  General: Pleasant, NAD Psych: Normal affect. Neuro: Alert and oriented X 3. Moves all extremities spontaneously. HEENT: Normal  Neck: Supple without bruits or JVD. Lungs:  Resp regular and unlabored, CTA. Heart: RRR no s3, s4, or murmurs. Abdomen: Soft, non-tender, non-distended, BS + x 4.  Extremities: No clubbing, cyanosis or edema. DP/PT/Radials 2+ and equal bilaterally.  Labs:  No results for input(s): CKTOTAL, CKMB, TROPONINI in the last 72 hours. Lab Results  Component Value Date   WBC 11.0* 02/02/2014   HGB 10.2* 02/02/2014   HCT 32.3* 02/02/2014   MCV 82.4 02/02/2014   PLT 364 02/02/2014   No results for input(s): NA, K, CL, CO2, BUN, CREATININE, CALCIUM, PROT, BILITOT, ALKPHOS, ALT, AST, GLUCOSE in the last 168 hours.  Invalid input(s): LABALBU Lab Results  Component Value Date   CHOL  05/04/2008    137        ATP III CLASSIFICATION:  <200     mg/dL   Desirable  200-239  mg/dL   Borderline High  >=240    mg/dL   High          HDL 43 05/04/2008   LDLCALC  05/04/2008    84        Total Cholesterol/HDL:CHD Risk Coronary Heart Disease Risk Table  Men   Women  1/2 Average Risk   3.4   3.3  Average Risk       5.0   4.4  2 X Average Risk   9.6   7.1  3 X Average Risk  23.4   11.0        Use the calculated Patient Ratio above and the CHD Risk Table to determine the patient's CHD Risk.        ATP III CLASSIFICATION (LDL):  <100     mg/dL    Optimal  100-129  mg/dL   Near or Above                    Optimal  130-159  mg/dL   Borderline  160-189  mg/dL   High  >190     mg/dL   Very High   TRIG 108 08/08/2013   Lab Results  Component Value Date   DDIMER 2.78* 10/20/2013   Invalid input(s): POCBNP  Accessory Clinical Findings  Echocardiogram - 08/03/2013 Study Conclusions  - Left ventricle: The cavity size was normal. Systolic function was normal. Wall motion was normal; there were no regional wall motion abnormalities. - Left atrium: The atrium was severely dilated.  ECG - SR, normal ECG  Cardiac cath 12/20/2012   Lexiscan nuclear stress test: 11/04/2013 Quantitative Gated Spect Images  QGS EDV: 70 ml  QGS ESV: 22 ml  Impression  Exercise Capacity: Lexiscan with no exercise.  BP Response: Normal blood pressure response.  Clinical Symptoms: Chest tightness  ECG Impression: No significant ST segment change suggestive of ischemia.  Comparison with Prior Nuclear Study: Study is compared with the report of the study from October, 2009  Overall Impression: Normal stress nuclear study. No scar or ischemia. Low risk scan. No change when compared to the report of October, 2009  LV Ejection Fraction: 69%. LV Wall Motion: Normal Wall Motion.  03/2014  Carotid US Heterogeneous plaque, bilaterally. Essentially stable 60-79% bilateral ICA stenosis.(LICA high end of range) Elevated left subclavian artery velocities. Patent vertebral arteries with antegrade flow    Assessment & Plan   74 year old female with multiple risk factors for CAD including lifelong smoking, known B/L carotid disease, HTN and hyperlipidemia  1. CAD- stable - Severe left circumflex and OM disease treated successfully with PCI x 2 on 12/20/2012. DAT recommended for 1 year. Negative stress test on 11/07/2013. Discontinue Plavix in December.  2. Chronic diastolic CHF -continue holding lasix, she is euvolemic. BMP check on Tuesday, we will follow for  hypokalemia.   3. Hypertension - controlled   4. Hyperlipidemia - managed by PCP, on high dose of atorvastatin 80 mg PO QHS, we'll check liver enzymes today   4. PAD - B/L 60-79% carotid disease, high end on the right, awaiting response to steroid shot to the left shoulder, if no response, we will refer to a vascular surgeon.   Follow up in 3 months.  Dorothy Spark, MD, [facility] 06/02/2014, 11:42 AM

## 2014-06-15 ENCOUNTER — Ambulatory Visit: Payer: Commercial Managed Care - HMO | Admitting: Vascular Surgery

## 2014-06-20 ENCOUNTER — Encounter: Payer: Self-pay | Admitting: Vascular Surgery

## 2014-06-22 ENCOUNTER — Ambulatory Visit: Payer: Commercial Managed Care - HMO | Admitting: Vascular Surgery

## 2014-06-26 ENCOUNTER — Encounter: Payer: Self-pay | Admitting: Vascular Surgery

## 2014-06-27 ENCOUNTER — Encounter: Payer: Self-pay | Admitting: Vascular Surgery

## 2014-06-28 ENCOUNTER — Telehealth: Payer: Self-pay | Admitting: Cardiology

## 2014-06-28 ENCOUNTER — Ambulatory Visit (INDEPENDENT_AMBULATORY_CARE_PROVIDER_SITE_OTHER): Payer: Commercial Managed Care - HMO | Admitting: Vascular Surgery

## 2014-06-28 ENCOUNTER — Encounter: Payer: Self-pay | Admitting: Vascular Surgery

## 2014-06-28 VITALS — BP 124/53 | HR 94 | Ht 60.0 in | Wt 149.2 lb

## 2014-06-28 DIAGNOSIS — I6523 Occlusion and stenosis of bilateral carotid arteries: Secondary | ICD-10-CM | POA: Diagnosis not present

## 2014-06-28 NOTE — Progress Notes (Signed)
Brief History and Physical  History of Present Illness  [name] is a 74 y.o. female who presents with chief complaint: Left arm numbness 2-3 times per week and weakness.  The patient presents today for F/U visit after seeing Dr. Eddie Dibbles at [place].  He diagnosed her with rotator cuff impingement and gave her an interarticular left shoulder injection.  She states her shoulder feels better, but he numbness is still present.    She denies any symptoms of classic TIA amaurosis or stroke. She is currently on aspirin once daily.   She has a history of tracheostomy due to a previous illness and bowel resection.    Past Medical History  Diagnosis Date  . Carotid artery disease     a. 60-70% bilat ICA stenosis by dopplers 05/2012.  Marland Kitchen Other and unspecified hyperlipidemia     takes Lipitor daily  . CAD (coronary artery disease)     a. Mod dz 2010 initially mgd medically. b. 12/2012 - angina s/p PTCA/DES to mid-circumflex, PTCA/DES to first OM.   Marland Kitchen Barrett's esophagus   . SBO (small bowel obstruction)     a. Lysis of adhesions and ovarian cystectomy 04/2013 for sBO. b. Admitted 07/2013 for colonic obstruction due to suspected colitis, s/p partial colectomy/colostomy 08/01/13. Admission complicated by anasarca, acute resp failure requiring tracheostomy, decannulated 08/25/13. c. Recurrent SBO8/2015, NGT placed for decompression.   . Colonic obstruction due to suspected colitis; s/p colectomy/colostomy     a. See SBO.  Marland Kitchen Protein-calorie malnutrition, severe w/ electrolyte imbalance     a. Severe hypoalbuminemia leading to 3rd spacing including anasarca and pulm edema 07/2013.  Marland Kitchen DVT of axillary vein, acute right     a. Dx 07/2013 felt due to R IJ central line that had been inserted 7/14. Not on anticoag due to GIB issues and small cerebral bleed.  . Acute respiratory failure with hypoxia s/p tracheostomy 07/2013  . Intracranial hemorrhage     a. 08/11/13 - per DC summary, tiny SAH vs SDH  done for altered mentation, anticoag stopped including aspirin.  . Anemia     a. 7-08/2013 felt due to recent critical illness/chronic disease.  . Complication of anesthesia     hard to wake up from anesthesia   . Hypertension     takes Metoprolol daily  . HTN (hypertension)   . Heart murmur   . COPD (chronic obstructive pulmonary disease)   . Pneumonia     hx of->15 yrs ago  . History of bronchitis     > 23yr ago   . Dizziness     was on Bentyl which caused this-took off of it and no problems since  . Stroke early 80's    right sided weakness--TIA  . Arthritis   . Joint pain   . Chronic back pain     deteriorating;DDD  . Neck pain     DDD  . History of hiatal hernia   . History of colon polyps   . History of kidney stones     was told it was stable but doesn't know for sure that she ever passed it  . Depression     takes Remeron and Zoloft daily    Past Surgical History  Procedure Laterality Date  . Appendectomy    . Cholecystectomy    . Colon resection    . Coronary angioplasty with stent placement  12/20/2012    STENT TO OM  DR Burt Knack  . Rotator cuff repair Left   . Abdominal hysterectomy    . Colon surgery    . Foot surgery Left   . Hand surgery Bilateral   . Laparotomy N/A 08/01/2013    Procedure: EXPLORATORY LAPAROTOMY;  Surgeon: Harl Bowie, MD;  Location: [place];  Service: General;  Laterality: N/A;  . Partial colectomy N/A 08/01/2013    Procedure: PARTIAL COLECTOMY;  Surgeon: Harl Bowie, MD;  Location: [place];  Service: General;  Laterality: N/A;  . Colostomy N/A 08/01/2013    Procedure: COLOSTOMY;  Surgeon: Harl Bowie, MD;  Location: [place];  Service: General;  Laterality: N/A;  . Bowel resection N/A 08/01/2013    Procedure: SMALL BOWEL RESECTION;  Surgeon: Harl Bowie, MD;  Location: [place];  Service: General;  Laterality: N/A;  . Tracheostomy      feinstein  . Left heart catheterization with coronary angiogram N/A  12/20/2012    Procedure: LEFT HEART CATHETERIZATION WITH CORONARY ANGIOGRAM;  Surgeon: Blane Ohara, MD;  Location[facility] C[facility] LAB;  Service: Cardiovascular;  Laterality: N/A;  . Cataract surgery Bilateral   . Colostomy takedown  01/24/2014    dr Ninfa Linden  . Colostomy takedown N/A 01/24/2014    Procedure: COLOSTOMY TAKEDOWN;  Surgeon: Coralie Keens, MD;  Location: [place];  Service: General;  Laterality: N/A;    History   Social History  . Marital Status: Married    Spouse Name: N/A  . Number of Children: N/A  . Years of Education: N/A   Occupational History  . full time    Social History Main Topics  . Smoking status: Heavy Tobacco Smoker -- 0.50 packs/day for 53 years    Types: Cigarettes  . Smokeless tobacco: Never Used     Comment: also uses e cig  . Alcohol Use: No  . Drug Use: No  . Sexual Activity: Not Currently    Birth Control/ Protection: Surgical   Other Topics Concern  . Not on file   Social History Narrative    Family History  Problem Relation Age of Onset  . Heart disease    . Varicose Veins Mother   . Heart disease Father   . Heart attack Father   . AAA (abdominal aortic aneurysm) Father     Current Outpatient Prescriptions on File Prior to Visit  Medication Sig Dispense Refill  . aspirin EC 81 MG tablet Take 162 mg by mouth daily.    Marland Kitchen atorvastatin (LIPITOR) 80 MG tablet Take 80 mg by mouth every evening.     . fish oil-omega-3 fatty acids 1000 MG capsule Take 1,000 mg by mouth daily.     . fluticasone (FLONASE) 50 MCG/ACT nasal spray Place 2 sprays into both nostrils as needed. As directed    . folic acid (FOLVITE) 347 MCG tablet Take 800 mcg by mouth daily.     . metoprolol succinate (TOPROL-XL) 25 MG 24 hr tablet TAKE 1 TABLET BY MOUTH DAILY 90 tablet 3  . mirtazapine (REMERON) 30 MG tablet Take 1 tablet by mouth daily.    . nitroGLYCERIN (NITROSTAT) 0.4 MG SL tablet Place 1 tablet (0.4 mg total) under the tongue every 5 (five) minutes as needed  for chest pain. 25 tablet 3  . ondansetron (ZOFRAN) 4 MG tablet Take 4 mg by mouth every 6 (six) hours as needed for nausea or vomiting.     . pantoprazole (PROTONIX) 40 MG tablet TAKE 1 TABLET TWICE DAILY 180 tablet  1  . predniSONE (DELTASONE) 5 MG tablet As directed     No current facility-administered medications on file prior to visit.    Allergies  Allergen Reactions  . Pregabalin Swelling    Tongue swelling  . Sulfonamide Derivatives Swelling    Childhood reaction    Review of Systems: As listed above, otherwise negative.  Physical Examination  Filed Vitals:   06/28/14 1556  BP: 124/53  Pulse: 94  Height: 5' (1.524 m)  Weight: 149 lb 3.2 oz (67.677 kg)  SpO2: 98%    General: A&O x 3, WDWN  Pulmonary: Sym exp, good air movt, CTAB, no rales, rhonchi, & wheezing  Cardiac: RRR, Nl S1, S2, no Murmurs, rubs or gallops  Gastrointestinal: soft, NTND, -G/R, - HSM, - masses, - CVAT B  Musculoskeletal: M/S 5/5 throughout , Extremities without ischemic changes   Carotid duplex: Medical Decision Making  BRYSON PALEN is a 74 y.o. female who presents with: Right carotid stenosis. She will see and ENT for vocal cord check and we will contact Dr. Meda Coffee for her verbal OK to proceed with Right CEA.  She will call once she has seen the ENT physician and we can schedule her surgery .  The patient was seen in conjunction with Dr. Carvel Getting, Junior PA-C  [place] Office: [phone]   06/28/2014, 4:29 PM  Patient still having events of numbness and tingling in her left arm. This was not improved after injection of her left shoulder. The still do not sound like TIA events. However, since she does have greater than 60% stenosis on the right side this could possibly be causing symptoms. She will need ENT evaluation for vocal cord check prior to her carotid endarterectomy since she has had previous tracheostomy. After she has had a vocal cord evaluation and we  receive cardiac risk profile from Dr. Meda Coffee we will schedule her for a right carotid endarterectomy. Risks benefits possible palpitations and procedure details were explained to the patient today including but not limited to bleeding infection stroke risk of 1-2% cranial nerve injury was 10-15% she understands and agrees to proceed.  Ruta Hinds, MD [facility] [place] Office: [phone] Pager: [phone]

## 2014-06-29 NOTE — Telephone Encounter (Signed)
Error

## 2014-06-30 ENCOUNTER — Telehealth: Payer: Self-pay

## 2014-06-30 NOTE — Telephone Encounter (Signed)
-----   Message from Dorothy Spark, MD sent at 06/29/2014  4:52 PM EDT ----- Regarding: RE: pre -op cardiac eval Yes, there is currently no contraindication from cardiac standpoint to undergo scheduled surgery. At the last appointment she had no signs of angina or CHF. She had normal stress test in 11/2013 and has preserved LVEF. Please let me know if you have any questions, Ena Dawley, MD   ----- Message -----    From: Denman George, RN    Sent: 06/29/2014  12:38 PM      To: Dorothy Spark, MD Subject: pre -op cardiac eval                           This pt. is to be scheduled for Right Carotid Endarterectomy pending ENT evaluation and Cardiology Clearance.  Are you able to give Cardiac Clearance based on evaluation in May 2016, or should she be rescheduled to see you in the office?

## 2014-06-30 NOTE — Addendum Note (Signed)
Addended by: Dorthula Rue L on: 06/30/2014 03:59 PM   Modules accepted: Orders

## 2014-07-17 ENCOUNTER — Other Ambulatory Visit: Payer: Self-pay

## 2014-07-25 ENCOUNTER — Other Ambulatory Visit: Payer: Self-pay

## 2014-07-26 NOTE — Patient Outreach (Signed)
[facility]) Care Management  07/26/2014  [name] July 22, 1940 [id number]   Referral from [facility] Tier 4 list and assigned to Quinn Plowman, RN for patient outreach.  [name] L. Elbert Ewings [facility] Care Management Assistant [phone]

## 2014-07-27 ENCOUNTER — Other Ambulatory Visit: Payer: Self-pay

## 2014-07-27 NOTE — Patient Outreach (Signed)
[facility]) Care Management  07/27/2014  [name] 1940-05-08 [id number]   Telephone call to patient regarding [facility] referral.  HIPAA confirmed with patient. Discussed and offered [facility] Care Management services to patient. Patient states she does not need [facility] services at this time and verbalized agreement to receive [facility] outreach letter and pamphlet.  Patient states she is scheduled to have carotid artery surgery August 08, 2014 and may consider the services at that time.    PLAN: RNCM will refer patient to [facility] [place] to close due to refusal of services. RNCM will notify patients primary MD of refusal of services. RNCM will send patient outreach letter and pamphlet.   Quinn Plowman RN,BSN,CCM [place] Coordinator [phone]

## 2014-07-31 NOTE — Patient Outreach (Signed)
[facility]) Care Management  07/31/2014  MARYKATHRYN CARBONI 04/20/1940 [id number]   Notification from Quinn Plowman, RN to close case due to patient refused to participate with [place] Management services.  Ronnell Freshwater. [place], [place] Management [place] Assistant Phone: [phone] Fax: [phone]

## 2014-08-01 ENCOUNTER — Encounter (HOSPITAL_COMMUNITY): Payer: Self-pay

## 2014-08-01 ENCOUNTER — Encounter (HOSPITAL_COMMUNITY)
Admission: RE | Admit: 2014-08-01 | Discharge: 2014-08-01 | Disposition: A | Discharge status: Home / Self Care | Payer: Commercial Managed Care - HMO | Source: Ambulatory Visit | Attending: Vascular Surgery | Admitting: Vascular Surgery

## 2014-08-01 DIAGNOSIS — E785 Hyperlipidemia, unspecified: Secondary | ICD-10-CM | POA: Insufficient documentation

## 2014-08-01 DIAGNOSIS — Z7982 Long term (current) use of aspirin: Secondary | ICD-10-CM | POA: Insufficient documentation

## 2014-08-01 DIAGNOSIS — Z01818 Encounter for other preprocedural examination: Secondary | ICD-10-CM | POA: Insufficient documentation

## 2014-08-01 DIAGNOSIS — Z79899 Other long term (current) drug therapy: Secondary | ICD-10-CM | POA: Diagnosis not present

## 2014-08-01 DIAGNOSIS — F17200 Nicotine dependence, unspecified, uncomplicated: Secondary | ICD-10-CM | POA: Diagnosis not present

## 2014-08-01 DIAGNOSIS — J449 Chronic obstructive pulmonary disease, unspecified: Secondary | ICD-10-CM | POA: Diagnosis not present

## 2014-08-01 DIAGNOSIS — Z01812 Encounter for preprocedural laboratory examination: Secondary | ICD-10-CM | POA: Diagnosis not present

## 2014-08-01 DIAGNOSIS — I10 Essential (primary) hypertension: Secondary | ICD-10-CM | POA: Insufficient documentation

## 2014-08-01 DIAGNOSIS — I6523 Occlusion and stenosis of bilateral carotid arteries: Secondary | ICD-10-CM | POA: Diagnosis not present

## 2014-08-01 DIAGNOSIS — Z86718 Personal history of other venous thrombosis and embolism: Secondary | ICD-10-CM | POA: Insufficient documentation

## 2014-08-01 DIAGNOSIS — Z0183 Encounter for blood typing: Secondary | ICD-10-CM | POA: Diagnosis not present

## 2014-08-01 DIAGNOSIS — I2510 Atherosclerotic heart disease of native coronary artery without angina pectoris: Secondary | ICD-10-CM | POA: Insufficient documentation

## 2014-08-01 HISTORY — DX: Gastro-esophageal reflux disease without esophagitis: K21.9

## 2014-08-01 LAB — TYPE AND SCREEN
ABO/RH(D): B POS
Antibody Screen: NEGATIVE

## 2014-08-01 LAB — COMPREHENSIVE METABOLIC PANEL
ALT: 14 U/L (ref 14–54)
AST: 18 U/L (ref 15–41)
Albumin: 3.7 g/dL (ref 3.5–5.0)
Alkaline Phosphatase: 94 U/L (ref 38–126)
Anion gap: 8 (ref 5–15)
BUN: 12 mg/dL (ref 6–20)
CO2: 26 mmol/L (ref 22–32)
CREATININE: 0.75 mg/dL (ref 0.44–1.00)
Calcium: 9.1 mg/dL (ref 8.9–10.3)
Chloride: 105 mmol/L (ref 101–111)
GLUCOSE: 89 mg/dL (ref 65–99)
POTASSIUM: 4.1 mmol/L (ref 3.5–5.1)
Sodium: 139 mmol/L (ref 135–145)
Total Bilirubin: 0.4 mg/dL (ref 0.3–1.2)
Total Protein: 6.6 g/dL (ref 6.5–8.1)

## 2014-08-01 LAB — URINALYSIS, ROUTINE W REFLEX MICROSCOPIC
Bilirubin Urine: NEGATIVE
Glucose, UA: NEGATIVE mg/dL
HGB URINE DIPSTICK: NEGATIVE
Ketones, ur: NEGATIVE mg/dL
Leukocytes, UA: NEGATIVE
Nitrite: NEGATIVE
PH: 6 (ref 5.0–8.0)
PROTEIN: NEGATIVE mg/dL
Specific Gravity, Urine: 1.012 (ref 1.005–1.030)
Urobilinogen, UA: 0.2 mg/dL (ref 0.0–1.0)

## 2014-08-01 LAB — SURGICAL PCR SCREEN
MRSA, PCR: NEGATIVE
STAPHYLOCOCCUS AUREUS: NEGATIVE

## 2014-08-01 LAB — CBC
HCT: 33.3 % — ABNORMAL LOW (ref 36.0–46.0)
Hemoglobin: 10.7 g/dL — ABNORMAL LOW (ref 12.0–15.0)
MCH: 26 pg (ref 26.0–34.0)
MCHC: 32.1 g/dL (ref 30.0–36.0)
MCV: 81 fL (ref 78.0–100.0)
Platelets: 207 10*3/uL (ref 150–400)
RBC: 4.11 MIL/uL (ref 3.87–5.11)
RDW: 16.7 % — AB (ref 11.5–15.5)
WBC: 6.8 10*3/uL (ref 4.0–10.5)

## 2014-08-01 LAB — APTT: aPTT: 28 seconds (ref 24–37)

## 2014-08-01 LAB — PROTIME-INR
INR: 1.03 (ref 0.00–1.49)
Prothrombin Time: 13.7 seconds (ref 11.6–15.2)

## 2014-08-01 NOTE — Pre-Procedure Instructions (Signed)
[name]  08/01/2014      [id number] [facility] [address]Phone: [phone] Fax: [phone] [facility] [place]Phone: [phone]: [phone] [place] [place]Phone: [phone]: [phone]   Your procedure is scheduled on 08/08/2014  Report to [facility] 5[facility]30 A.M.  Call this number if you have problems the morning of surgery:  [phone]   Remember:  Do not eat food or drink liquids after midnight. On Monday     Take these medicines the morning of surgery with A SIP OF WATER : PANTOPRAZOLE, SERTRALINE, METOPROLOL   Do not wear jewelry, make-up or nail polish.  Do not wear lotions, powders, or perfumes.  You may wear deodorant.  Do not shave 48 hours prior to surgery.   Do not bring valuables to the hospital.  [facility] or [facility].  Contacts, dentures or bridgework may not be worn into surgery.  Leave your suitcase in the car.  After surgery it may be brought to your room.  For patients admitted to the hospital, discharge time will be determined by your treatment team.  Patients discharged the day of surgery will not be allowed to drive home.   Name and phone number of your driver:   husband Special instructions:  Special Instructions: [place] - Preparing for Surgery  Before surgery, you can play an important role.  Because skin is not sterile, your skin needs to be as free of germs as possible.  You can reduce the number of germs on you skin by washing with CHG (chlorahexidine gluconate) soap before surgery.  CHG is an antiseptic cleaner which kills germs and bonds with the skin to continue killing germs even after  washing.  Please DO NOT use if you have an allergy to CHG or antibacterial soaps.  If your skin becomes reddened/irritated stop using the CHG and inform your nurse when you arrive at Short Stay.  Do not shave (including legs and underarms) for at least 48 hours prior to the first CHG shower.  You may shave your face.  Please follow these instructions carefully:   1.  Shower with CHG Soap the night before surgery and the  morning of Surgery.  2.  If you choose to wash your hair, wash your hair first as usual with your  normal shampoo.  3.  After you shampoo, rinse your hair and body thoroughly to remove the  Shampoo.  4.  Use CHG as you would any other liquid soap.  You can apply chg directly to the skin and wash gently with scrungie or a clean washcloth.  5.  Apply the CHG Soap to your body ONLY FROM THE NECK DOWN.    Do not use on open wounds or open sores.  Avoid contact with your eyes, ears, mouth and genitals (private parts).  Wash genitals (private parts)   with your normal soap.  6.  Wash thoroughly, paying special attention to the area where your surgery will be performed.  7.  Thoroughly rinse your body with warm water from the neck down.  8.  DO NOT shower/wash with your normal soap after using and rinsing  off   the CHG Soap.  9.  Pat yourself dry with a clean towel.            10.  Wear clean pajamas.            11.  Place clean sheets on your bed the night of your first shower and do not sleep with pets.  Day of Surgery  Do not apply any lotions/deodorants the morning of surgery.  Please wear clean clothes to the hospital/surgery center.  Please read over the following fact sheets that you were given. Pain Booklet, Coughing and Deep Breathing, Blood Transfusion Information, MRSA Information and Surgical Site Infection Prevention

## 2014-08-02 NOTE — Progress Notes (Signed)
Anesthesia Chart Review:   Patient is a 74 year old female scheduled for R CEA on 08/05/2014 by Dr. Oneida Alar.   PCP is Laverna Peace, NP. Cardiologist is Dr. Ena Dawley, last visit 06/02/14.   She was admitted for SBO s/p LOA and ovarian cystectomy 04/29/13 [facility]) and recurrent colonic obstruction due to colitis s/p partial colectomy/colostomy 08/01/13 with hospital course complicated by anasarca, HCAP/ARDS with VDRF s/p tracheostomy (decannulated 08/25/13) with recurrent SBO treated with NGT 08/2013. S/p colostomy takedown [facility] at [facility] 02/27/72, complicated by ileus and acute respiratory failure requiring emergent intubation.   Other history includes CAD (s/p DES to mid CX and OM1 12/20/2012), HTN, right axillary vein DVT 07/2013 due to right IF CVL (not anticoagulated due to GIB and left frontal lobe subcentimeter focal parenchymal hemorrhage by 08/11/13 CT), moderate carotid occlusive disease with severe left SCA stenosis, anemia, COPD, Barrett's esophagus, hiatal hernia, HLD, depression. She reported being hard to wake up after anesthesia. Current heavy smoker. BMI 30.  Medications include: ASA, lipitor, metoprolol, mirtazapine, protonix.   Preoperative labs reviewed.  H/H 10.7/33.3.  1 view CXR 02/01/14 in setting of critical care for acute respiratory failure: Endotracheal tube not visualized. Little change in pulmonary vascular congestion and effusions. Cannot exclude lower lobe pneumonia left-greater-than-right.  Will repeat CXR DOS.   EKG 01/28/2014 in the setting of critical care for acute respiratory failure: sinus tachycardia (117 bpm).   04/07/14 carotid duplex:  -Heterogeneous plaque, bilaterally. -Essentially stable 60-79% bilateral ICA stenosis.(LICA high end of range) -Elevated left subclavian artery velocities. -Patent vertebral arteries with antegrade flow.  Echo 01/30/2014: - Left ventricle: The cavity size was normal. There was mild focal basal hypertrophy of the  septum. Systolic function was normal. The estimated ejection fraction was in the range of 55% to 60%. - Aortic valve: There was mild regurgitation. - Mitral valve: There was mild regurgitation. - Left atrium: The atrium was mildly dilated. - Atrial septum: No defect or patent foramen ovale was identified. - Pericardium, extracardiac: A trivial pericardial effusion was identified posterior to the heart. There was a left pleural effusion. - Impressions: Medial E/E&' signal suggests elevated EDP.  11/04/13 Nuclear stress test: Overall Impression: Normal stress nuclear study. No scar or ischemia. Low risk scan. No change when compared to the report of October, 2009. LV Ejection Fraction: 69%. LV Wall Motion: Normal Wall Motion.  12/20/12 cardiac cath: Final Conclusions:  1. Severe left circumflex (95%) and OM1 (80%) disease treated successfully with PCI/Promus DES. 2. Moderate LAD (30-40% mid LAD, 50% D1) and RCA (50% mid RCA) stenosis unchanged from the previous  study in 2006.  3. Normal LV function, EF 55-65%.  She has been cleared by cardiology for surgery (see Epic telephone encounter by Michaele Offer, RN dated 06/30/14). Unfortunately, she continues to smoke. She had no chest pain or SOB at her recent cardiology visit, and her sats were 99% at PAT. If no acute cardiopulmonary issues on the day of surgery then I anticipate that she can proceed as planned.   Willeen Cass, FNP-BC [facility] Short Stay Surgical Center/Anesthesiology Phone: [phone] 08/02/2014 2:17 PM

## 2014-08-07 MED ORDER — SODIUM CHLORIDE 0.9 % IV SOLN: INTRAVENOUS | Status: DC

## 2014-08-07 MED ORDER — DEXTROSE 5 % IV SOLN
1.5000 g | INTRAVENOUS | Status: AC
  Administered 2014-08-08: 1.5 g via INTRAVENOUS
  Filled 2014-08-07: qty 1.5

## 2014-08-08 ENCOUNTER — Inpatient Hospital Stay (HOSPITAL_COMMUNITY): Payer: Commercial Managed Care - HMO

## 2014-08-08 ENCOUNTER — Encounter (HOSPITAL_COMMUNITY): Payer: Self-pay | Admitting: *Deleted

## 2014-08-08 ENCOUNTER — Encounter (HOSPITAL_COMMUNITY)
Admission: RE | Disposition: A | Discharge status: Rehabilitation Facility | Payer: Self-pay | Source: Ambulatory Visit | Attending: Vascular Surgery

## 2014-08-08 ENCOUNTER — Inpatient Hospital Stay (HOSPITAL_COMMUNITY): Payer: Commercial Managed Care - HMO | Admitting: Anesthesiology

## 2014-08-08 ENCOUNTER — Inpatient Hospital Stay (HOSPITAL_COMMUNITY): Payer: Commercial Managed Care - HMO | Admitting: Emergency Medicine

## 2014-08-08 ENCOUNTER — Inpatient Hospital Stay (HOSPITAL_COMMUNITY)
Admission: RE | Admit: 2014-08-08 | Discharge: 2014-08-11 | DRG: 037 | Disposition: A | Discharge status: Rehabilitation Facility | Payer: Commercial Managed Care - HMO | Source: Ambulatory Visit | Attending: Vascular Surgery | Admitting: Vascular Surgery

## 2014-08-08 ENCOUNTER — Other Ambulatory Visit (HOSPITAL_COMMUNITY): Payer: Self-pay | Admitting: *Deleted

## 2014-08-08 DIAGNOSIS — R471 Dysarthria and anarthria: Secondary | ICD-10-CM | POA: Diagnosis not present

## 2014-08-08 DIAGNOSIS — E785 Hyperlipidemia, unspecified: Secondary | ICD-10-CM | POA: Diagnosis present

## 2014-08-08 DIAGNOSIS — R51 Headache: Secondary | ICD-10-CM | POA: Diagnosis not present

## 2014-08-08 DIAGNOSIS — I10 Essential (primary) hypertension: Secondary | ICD-10-CM | POA: Diagnosis present

## 2014-08-08 DIAGNOSIS — Z90710 Acquired absence of both cervix and uterus: Secondary | ICD-10-CM | POA: Diagnosis not present

## 2014-08-08 DIAGNOSIS — I63411 Cerebral infarction due to embolism of right middle cerebral artery: Secondary | ICD-10-CM | POA: Diagnosis not present

## 2014-08-08 DIAGNOSIS — Z7982 Long term (current) use of aspirin: Secondary | ICD-10-CM | POA: Diagnosis not present

## 2014-08-08 DIAGNOSIS — G510 Bell's palsy: Secondary | ICD-10-CM | POA: Diagnosis present

## 2014-08-08 DIAGNOSIS — E43 Unspecified severe protein-calorie malnutrition: Secondary | ICD-10-CM | POA: Diagnosis not present

## 2014-08-08 DIAGNOSIS — R29810 Facial weakness: Secondary | ICD-10-CM | POA: Diagnosis not present

## 2014-08-08 DIAGNOSIS — I6521 Occlusion and stenosis of right carotid artery: Secondary | ICD-10-CM | POA: Diagnosis not present

## 2014-08-08 DIAGNOSIS — Z01811 Encounter for preprocedural respiratory examination: Secondary | ICD-10-CM

## 2014-08-08 DIAGNOSIS — K219 Gastro-esophageal reflux disease without esophagitis: Secondary | ICD-10-CM | POA: Diagnosis present

## 2014-08-08 DIAGNOSIS — R29898 Other symptoms and signs involving the musculoskeletal system: Secondary | ICD-10-CM

## 2014-08-08 DIAGNOSIS — I2510 Atherosclerotic heart disease of native coronary artery without angina pectoris: Secondary | ICD-10-CM | POA: Diagnosis present

## 2014-08-08 DIAGNOSIS — Z882 Allergy status to sulfonamides status: Secondary | ICD-10-CM | POA: Diagnosis not present

## 2014-08-08 DIAGNOSIS — R531 Weakness: Secondary | ICD-10-CM | POA: Diagnosis present

## 2014-08-08 DIAGNOSIS — Z955 Presence of coronary angioplasty implant and graft: Secondary | ICD-10-CM

## 2014-08-08 DIAGNOSIS — Z9889 Other specified postprocedural states: Secondary | ICD-10-CM | POA: Diagnosis not present

## 2014-08-08 DIAGNOSIS — Z8673 Personal history of transient ischemic attack (TIA), and cerebral infarction without residual deficits: Secondary | ICD-10-CM

## 2014-08-08 DIAGNOSIS — G8194 Hemiplegia, unspecified affecting left nondominant side: Secondary | ICD-10-CM | POA: Diagnosis not present

## 2014-08-08 DIAGNOSIS — Y838 Other surgical procedures as the cause of abnormal reaction of the patient, or of later complication, without mention of misadventure at the time of the procedure: Secondary | ICD-10-CM | POA: Diagnosis not present

## 2014-08-08 DIAGNOSIS — J449 Chronic obstructive pulmonary disease, unspecified: Secondary | ICD-10-CM | POA: Diagnosis present

## 2014-08-08 DIAGNOSIS — Z7952 Long term (current) use of systemic steroids: Secondary | ICD-10-CM

## 2014-08-08 DIAGNOSIS — G8190 Hemiplegia, unspecified affecting unspecified side: Secondary | ICD-10-CM | POA: Diagnosis not present

## 2014-08-08 DIAGNOSIS — Z930 Tracheostomy status: Secondary | ICD-10-CM | POA: Diagnosis not present

## 2014-08-08 DIAGNOSIS — I6529 Occlusion and stenosis of unspecified carotid artery: Secondary | ICD-10-CM | POA: Diagnosis present

## 2014-08-08 DIAGNOSIS — Z888 Allergy status to other drugs, medicaments and biological substances status: Secondary | ICD-10-CM

## 2014-08-08 DIAGNOSIS — F17210 Nicotine dependence, cigarettes, uncomplicated: Secondary | ICD-10-CM | POA: Diagnosis present

## 2014-08-08 DIAGNOSIS — I639 Cerebral infarction, unspecified: Secondary | ICD-10-CM

## 2014-08-08 DIAGNOSIS — Z79899 Other long term (current) drug therapy: Secondary | ICD-10-CM | POA: Diagnosis not present

## 2014-08-08 DIAGNOSIS — R069 Unspecified abnormalities of breathing: Secondary | ICD-10-CM

## 2014-08-08 DIAGNOSIS — I6789 Other cerebrovascular disease: Secondary | ICD-10-CM | POA: Diagnosis not present

## 2014-08-08 DIAGNOSIS — I63511 Cerebral infarction due to unspecified occlusion or stenosis of right middle cerebral artery: Secondary | ICD-10-CM | POA: Diagnosis not present

## 2014-08-08 DIAGNOSIS — E872 Acidosis: Secondary | ICD-10-CM | POA: Diagnosis present

## 2014-08-08 DIAGNOSIS — I5032 Chronic diastolic (congestive) heart failure: Secondary | ICD-10-CM | POA: Diagnosis not present

## 2014-08-08 DIAGNOSIS — I63311 Cerebral infarction due to thrombosis of right middle cerebral artery: Secondary | ICD-10-CM | POA: Diagnosis not present

## 2014-08-08 HISTORY — PX: ENDARTERECTOMY: SHX5162

## 2014-08-08 LAB — POCT I-STAT 7, (LYTES, BLD GAS, ICA,H+H)
ACID-BASE DEFICIT: 5 mmol/L — AB (ref 0.0–2.0)
Acid-base deficit: 4 mmol/L — ABNORMAL HIGH (ref 0.0–2.0)
BICARBONATE: 22.1 meq/L (ref 20.0–24.0)
Bicarbonate: 21.3 mEq/L (ref 20.0–24.0)
Calcium, Ion: 1.24 mmol/L (ref 1.13–1.30)
Calcium, Ion: 1.25 mmol/L (ref 1.13–1.30)
HCT: 32 % — ABNORMAL LOW (ref 36.0–46.0)
HEMATOCRIT: 32 % — AB (ref 36.0–46.0)
HEMOGLOBIN: 10.9 g/dL — AB (ref 12.0–15.0)
Hemoglobin: 10.9 g/dL — ABNORMAL LOW (ref 12.0–15.0)
O2 SAT: 75 %
O2 SAT: 90 %
PH ART: 7.287 — AB (ref 7.350–7.450)
PO2 ART: 65 mmHg — AB (ref 80.0–100.0)
POTASSIUM: 3.9 mmol/L (ref 3.5–5.1)
Patient temperature: 37
Potassium: 3.8 mmol/L (ref 3.5–5.1)
Sodium: 142 mmol/L (ref 135–145)
Sodium: 142 mmol/L (ref 135–145)
TCO2: 23 mmol/L (ref 0–100)
TCO2: 23 mmol/L (ref 0–100)
pCO2 arterial: 44.7 mmHg (ref 35.0–45.0)
pCO2 arterial: 44.8 mmHg (ref 35.0–45.0)
pH, Arterial: 7.301 — ABNORMAL LOW (ref 7.350–7.450)
pO2, Arterial: 45 mmHg — ABNORMAL LOW (ref 80.0–100.0)

## 2014-08-08 SURGERY — ENDARTERECTOMY, CAROTID
Anesthesia: General | Site: Neck | Laterality: Right

## 2014-08-08 MED ORDER — LIDOCAINE HCL (PF) 1 % IJ SOLN
INTRAMUSCULAR | Status: AC
  Filled 2014-08-08: qty 30

## 2014-08-08 MED ORDER — HYDRALAZINE HCL 20 MG/ML IJ SOLN: 5.0000 mg | INTRAMUSCULAR | Status: DC | PRN

## 2014-08-08 MED ORDER — LIDOCAINE HCL (CARDIAC) 20 MG/ML IV SOLN
INTRAVENOUS | Status: AC
  Filled 2014-08-08: qty 5

## 2014-08-08 MED ORDER — PHENYLEPHRINE 40 MCG/ML (10ML) SYRINGE FOR IV PUSH (FOR BLOOD PRESSURE SUPPORT)
PREFILLED_SYRINGE | INTRAVENOUS | Status: AC
  Filled 2014-08-08: qty 20

## 2014-08-08 MED ORDER — DEXTROSE 5 % IV SOLN
1.5000 g | Freq: Two times a day (BID) | INTRAVENOUS | Status: AC
  Administered 2014-08-08 – 2014-08-09 (×2): 1.5 g via INTRAVENOUS
  Filled 2014-08-08 (×2): qty 1.5

## 2014-08-08 MED ORDER — MIRTAZAPINE 30 MG PO TABS
30.0000 mg | ORAL_TABLET | Freq: Every day | ORAL | Status: DC
Start: 2014-08-08 — End: 2014-08-11
  Administered 2014-08-08 – 2014-08-10 (×3): 30 mg via ORAL
  Filled 2014-08-08 (×4): qty 1

## 2014-08-08 MED ORDER — ONDANSETRON HCL 4 MG PO TABS: 4.0000 mg | ORAL_TABLET | Freq: Four times a day (QID) | ORAL | Status: DC | PRN

## 2014-08-08 MED ORDER — SODIUM CHLORIDE 0.9 % IR SOLN
Status: DC | PRN
  Administered 2014-08-08: 500 mL

## 2014-08-08 MED ORDER — THROMBIN 20000 UNITS EX SOLR
CUTANEOUS | Status: AC
  Filled 2014-08-08: qty 20000

## 2014-08-08 MED ORDER — OMEGA-3 FATTY ACIDS 1000 MG PO CAPS: 1000.0000 mg | ORAL_CAPSULE | Freq: Every day | ORAL | Status: DC

## 2014-08-08 MED ORDER — LIDOCAINE HCL (CARDIAC) 20 MG/ML IV SOLN
INTRAVENOUS | Status: DC | PRN
  Administered 2014-08-08: 20 mg via INTRAVENOUS

## 2014-08-08 MED ORDER — ASPIRIN EC 81 MG PO TBEC
162.0000 mg | DELAYED_RELEASE_TABLET | Freq: Every day | ORAL | Status: DC
  Administered 2014-08-08 – 2014-08-11 (×4): 162 mg via ORAL
  Filled 2014-08-08 (×4): qty 2

## 2014-08-08 MED ORDER — ACETAMINOPHEN 650 MG RE SUPP: 325.0000 mg | RECTAL | Status: DC | PRN

## 2014-08-08 MED ORDER — FENTANYL CITRATE (PF) 100 MCG/2ML IJ SOLN
25.0000 ug | INTRAMUSCULAR | Status: DC | PRN
  Administered 2014-08-08 (×4): 25 ug via INTRAVENOUS

## 2014-08-08 MED ORDER — MIDAZOLAM HCL 2 MG/2ML IJ SOLN
INTRAMUSCULAR | Status: AC
  Filled 2014-08-08: qty 2

## 2014-08-08 MED ORDER — ROCURONIUM BROMIDE 100 MG/10ML IV SOLN
INTRAVENOUS | Status: DC | PRN
  Administered 2014-08-08: 40 mg via INTRAVENOUS
  Administered 2014-08-08: 10 mg via INTRAVENOUS

## 2014-08-08 MED ORDER — PHENYLEPHRINE HCL 10 MG/ML IJ SOLN
INTRAMUSCULAR | Status: DC | PRN
  Administered 2014-08-08 (×3): 80 ug via INTRAVENOUS
  Administered 2014-08-08: 40 ug via INTRAVENOUS
  Administered 2014-08-08 (×2): 80 ug via INTRAVENOUS
  Administered 2014-08-08: 40 ug via INTRAVENOUS

## 2014-08-08 MED ORDER — DOPAMINE-DEXTROSE 3.2-5 MG/ML-% IV SOLN
0.0000 ug/kg/min | INTRAVENOUS | Status: AC
  Administered 2014-08-08: 3 ug/min via INTRAVENOUS
  Filled 2014-08-08: qty 250

## 2014-08-08 MED ORDER — ONDANSETRON HCL 4 MG/2ML IJ SOLN
INTRAMUSCULAR | Status: AC
  Filled 2014-08-08: qty 2

## 2014-08-08 MED ORDER — ENOXAPARIN SODIUM 30 MG/0.3ML ~~LOC~~ SOLN
30.0000 mg | SUBCUTANEOUS | Status: DC
  Filled 2014-08-08: qty 0.3

## 2014-08-08 MED ORDER — FUROSEMIDE 10 MG/ML IJ SOLN
20.0000 mg | Freq: Once | INTRAMUSCULAR | Status: AC
  Administered 2014-08-08: 20 mg via INTRAVENOUS

## 2014-08-08 MED ORDER — PROPOFOL 10 MG/ML IV BOLUS
INTRAVENOUS | Status: AC
  Filled 2014-08-08: qty 20

## 2014-08-08 MED ORDER — METOPROLOL TARTRATE 1 MG/ML IV SOLN: 2.0000 mg | INTRAVENOUS | Status: DC | PRN

## 2014-08-08 MED ORDER — LABETALOL HCL 5 MG/ML IV SOLN
INTRAVENOUS | Status: DC | PRN
  Administered 2014-08-08 (×2): 5 mg via INTRAVENOUS

## 2014-08-08 MED ORDER — ALUM & MAG HYDROXIDE-SIMETH 200-200-20 MG/5ML PO SUSP: 15.0000 mL | ORAL | Status: DC | PRN

## 2014-08-08 MED ORDER — SERTRALINE HCL 100 MG PO TABS
100.0000 mg | ORAL_TABLET | Freq: Two times a day (BID) | ORAL | Status: DC
Start: 2014-08-08 — End: 2014-08-11
  Administered 2014-08-08 – 2014-08-11 (×6): 100 mg via ORAL
  Filled 2014-08-08 (×7): qty 1

## 2014-08-08 MED ORDER — HEPARIN SODIUM (PORCINE) 1000 UNIT/ML IJ SOLN
INTRAMUSCULAR | Status: AC
Start: 2014-08-08 — End: 2014-08-09
  Filled 2014-08-08: qty 1

## 2014-08-08 MED ORDER — OXYCODONE HCL 5 MG PO TABS: 5.0000 mg | ORAL_TABLET | Freq: Four times a day (QID) | ORAL | Status: DC | PRN

## 2014-08-08 MED ORDER — DOPAMINE-DEXTROSE 3.2-5 MG/ML-% IV SOLN
2.0000 ug/kg/min | INTRAVENOUS | Status: DC
  Administered 2014-08-08: 8 ug/min via INTRAVENOUS
  Administered 2014-08-08: 9 ug/min via INTRAVENOUS
  Filled 2014-08-08 (×2): qty 250

## 2014-08-08 MED ORDER — CHLORHEXIDINE GLUCONATE 4 % EX LIQD: 60.0000 mL | Freq: Once | CUTANEOUS | Status: DC

## 2014-08-08 MED ORDER — ONDANSETRON HCL 4 MG/2ML IJ SOLN
INTRAMUSCULAR | Status: AC
Start: 2014-08-08 — End: 2014-08-09
  Filled 2014-08-08: qty 2

## 2014-08-08 MED ORDER — OXYCODONE HCL 5 MG PO TABS
5.0000 mg | ORAL_TABLET | ORAL | Status: DC | PRN
  Administered 2014-08-09 – 2014-08-11 (×6): 5 mg via ORAL
  Filled 2014-08-08 (×6): qty 1

## 2014-08-08 MED ORDER — ONDANSETRON HCL 4 MG/2ML IJ SOLN
4.0000 mg | Freq: Four times a day (QID) | INTRAMUSCULAR | Status: DC | PRN
  Administered 2014-08-09 – 2014-08-10 (×2): 4 mg via INTRAVENOUS
  Filled 2014-08-08 (×2): qty 2

## 2014-08-08 MED ORDER — SODIUM CHLORIDE 0.9 % IV SOLN: INTRAVENOUS | Status: DC

## 2014-08-08 MED ORDER — PROPOFOL 10 MG/ML IV BOLUS
INTRAVENOUS | Status: DC | PRN
  Administered 2014-08-08: 80 mg via INTRAVENOUS
  Administered 2014-08-08 (×2): 20 mg via INTRAVENOUS
  Administered 2014-08-08: 30 mg via INTRAVENOUS

## 2014-08-08 MED ORDER — ROCURONIUM BROMIDE 50 MG/5ML IV SOLN
INTRAVENOUS | Status: AC
  Filled 2014-08-08: qty 1

## 2014-08-08 MED ORDER — ONDANSETRON HCL 4 MG/2ML IJ SOLN
4.0000 mg | Freq: Once | INTRAMUSCULAR | Status: AC
  Administered 2014-08-08: 4 mg via INTRAVENOUS

## 2014-08-08 MED ORDER — 0.9 % SODIUM CHLORIDE (POUR BTL) OPTIME
TOPICAL | Status: DC | PRN
  Administered 2014-08-08: 2000 mL

## 2014-08-08 MED ORDER — FUROSEMIDE 10 MG/ML IJ SOLN
INTRAMUSCULAR | Status: AC
  Filled 2014-08-08: qty 4

## 2014-08-08 MED ORDER — ACETAMINOPHEN 325 MG PO TABS
325.0000 mg | ORAL_TABLET | ORAL | Status: DC | PRN
  Administered 2014-08-09: 650 mg via ORAL
  Filled 2014-08-08 (×2): qty 2

## 2014-08-08 MED ORDER — STROKE: EARLY STAGES OF RECOVERY BOOK
Freq: Once | Status: DC
  Filled 2014-08-08 (×2): qty 1

## 2014-08-08 MED ORDER — ALBUTEROL SULFATE (2.5 MG/3ML) 0.083% IN NEBU
2.5000 mg | INHALATION_SOLUTION | Freq: Once | RESPIRATORY_TRACT | Status: AC
  Administered 2014-08-08: 2.5 mg via RESPIRATORY_TRACT

## 2014-08-08 MED ORDER — ALBUTEROL SULFATE HFA 108 (90 BASE) MCG/ACT IN AERS
INHALATION_SPRAY | RESPIRATORY_TRACT | Status: DC | PRN
  Administered 2014-08-08: 2 via RESPIRATORY_TRACT

## 2014-08-08 MED ORDER — NITROGLYCERIN 1 MG/10 ML FOR IR/CATH LAB
INTRA_ARTERIAL | Status: AC
Start: 2014-08-08 — End: 2014-08-09
  Filled 2014-08-08: qty 10

## 2014-08-08 MED ORDER — PROTAMINE SULFATE 10 MG/ML IV SOLN
INTRAVENOUS | Status: DC | PRN
  Administered 2014-08-08 (×7): 10 mg via INTRAVENOUS

## 2014-08-08 MED ORDER — GLYCOPYRROLATE 0.2 MG/ML IJ SOLN
INTRAMUSCULAR | Status: AC
  Filled 2014-08-08: qty 3

## 2014-08-08 MED ORDER — DOCUSATE SODIUM 100 MG PO CAPS
100.0000 mg | ORAL_CAPSULE | Freq: Two times a day (BID) | ORAL | Status: DC
Start: 2014-08-08 — End: 2014-08-11
  Administered 2014-08-08 – 2014-08-11 (×6): 100 mg via ORAL
  Filled 2014-08-08 (×6): qty 1

## 2014-08-08 MED ORDER — EPHEDRINE SULFATE 50 MG/ML IJ SOLN
INTRAMUSCULAR | Status: DC | PRN
  Administered 2014-08-08: 10 mg via INTRAVENOUS

## 2014-08-08 MED ORDER — OMEGA-3-ACID ETHYL ESTERS 1 G PO CAPS
1.0000 g | ORAL_CAPSULE | Freq: Every day | ORAL | Status: DC
  Administered 2014-08-09 – 2014-08-11 (×3): 1 g via ORAL
  Filled 2014-08-08 (×4): qty 1

## 2014-08-08 MED ORDER — HEPARIN SODIUM (PORCINE) 1000 UNIT/ML IJ SOLN
1000.0000 [IU] | Freq: Once | INTRAMUSCULAR | Status: AC
  Administered 2014-08-08: 1000 [IU] via INTRAVENOUS

## 2014-08-08 MED ORDER — PANTOPRAZOLE SODIUM 40 MG PO TBEC
40.0000 mg | DELAYED_RELEASE_TABLET | Freq: Two times a day (BID) | ORAL | Status: DC
  Administered 2014-08-08 – 2014-08-11 (×6): 40 mg via ORAL
  Filled 2014-08-08 (×6): qty 1

## 2014-08-08 MED ORDER — GUAIFENESIN-DM 100-10 MG/5ML PO SYRP
15.0000 mL | ORAL_SOLUTION | ORAL | Status: DC | PRN
Start: 2014-08-08 — End: 2014-08-11

## 2014-08-08 MED ORDER — LIDOCAINE HCL 1 % IJ SOLN
INTRAMUSCULAR | Status: AC
  Filled 2014-08-08: qty 20

## 2014-08-08 MED ORDER — SODIUM CHLORIDE 0.9 % IV SOLN
10.0000 mg | INTRAVENOUS | Status: DC | PRN
  Administered 2014-08-08: 20 ug/min via INTRAVENOUS

## 2014-08-08 MED ORDER — FENTANYL CITRATE (PF) 250 MCG/5ML IJ SOLN
INTRAMUSCULAR | Status: AC
  Filled 2014-08-08: qty 5

## 2014-08-08 MED ORDER — SODIUM CHLORIDE 0.9 % IV SOLN: 500.0000 mL | Freq: Once | INTRAVENOUS | Status: AC | PRN

## 2014-08-08 MED ORDER — FENTANYL CITRATE (PF) 100 MCG/2ML IJ SOLN
INTRAMUSCULAR | Status: AC | PRN
  Administered 2014-08-08 (×2): 12.5 ug via INTRAVENOUS

## 2014-08-08 MED ORDER — PHENOL 1.4 % MT LIQD: 1.0000 | OROMUCOSAL | Status: DC | PRN

## 2014-08-08 MED ORDER — FENTANYL CITRATE (PF) 100 MCG/2ML IJ SOLN
INTRAMUSCULAR | Status: AC
  Filled 2014-08-08: qty 2

## 2014-08-08 MED ORDER — SUCCINYLCHOLINE CHLORIDE 20 MG/ML IJ SOLN
INTRAMUSCULAR | Status: AC
  Filled 2014-08-08: qty 1

## 2014-08-08 MED ORDER — ONDANSETRON HCL 4 MG/2ML IJ SOLN: 4.0000 mg | Freq: Four times a day (QID) | INTRAMUSCULAR | Status: DC | PRN

## 2014-08-08 MED ORDER — ALBUTEROL SULFATE (2.5 MG/3ML) 0.083% IN NEBU
INHALATION_SOLUTION | RESPIRATORY_TRACT | Status: AC
Start: 2014-08-08 — End: 2014-08-08
  Filled 2014-08-08: qty 3

## 2014-08-08 MED ORDER — NITROGLYCERIN 0.4 MG SL SUBL: 0.4000 mg | SUBLINGUAL_TABLET | SUBLINGUAL | Status: DC | PRN

## 2014-08-08 MED ORDER — EPHEDRINE SULFATE 50 MG/ML IJ SOLN
INTRAMUSCULAR | Status: AC
  Filled 2014-08-08: qty 1

## 2014-08-08 MED ORDER — PHENYLEPHRINE HCL 10 MG/ML IJ SOLN
INTRAMUSCULAR | Status: AC
  Filled 2014-08-08: qty 2

## 2014-08-08 MED ORDER — BISACODYL 10 MG RE SUPP: 10.0000 mg | Freq: Every day | RECTAL | Status: DC | PRN

## 2014-08-08 MED ORDER — HEPARIN SODIUM (PORCINE) 1000 UNIT/ML IJ SOLN
INTRAMUSCULAR | Status: DC | PRN
  Administered 2014-08-08: 7000 [IU] via INTRAVENOUS

## 2014-08-08 MED ORDER — ONDANSETRON HCL 4 MG/2ML IJ SOLN
INTRAMUSCULAR | Status: DC | PRN
  Administered 2014-08-08: 4 mg via INTRAVENOUS

## 2014-08-08 MED ORDER — POTASSIUM CHLORIDE CRYS ER 20 MEQ PO TBCR: 20.0000 meq | EXTENDED_RELEASE_TABLET | Freq: Every day | ORAL | Status: DC | PRN

## 2014-08-08 MED ORDER — NEOSTIGMINE METHYLSULFATE 10 MG/10ML IV SOLN
INTRAVENOUS | Status: AC
  Filled 2014-08-08: qty 1

## 2014-08-08 MED ORDER — METOPROLOL SUCCINATE ER 25 MG PO TB24
25.0000 mg | ORAL_TABLET | Freq: Every day | ORAL | Status: DC
Start: 2014-08-09 — End: 2014-08-11
  Administered 2014-08-10 – 2014-08-11 (×2): 25 mg via ORAL
  Filled 2014-08-08 (×3): qty 1

## 2014-08-08 MED ORDER — NEOSTIGMINE METHYLSULFATE 10 MG/10ML IV SOLN
INTRAVENOUS | Status: DC | PRN
  Administered 2014-08-08: 4 mg via INTRAVENOUS

## 2014-08-08 MED ORDER — IOHEXOL 300 MG/ML  SOLN
150.0000 mL | Freq: Once | INTRAMUSCULAR | Status: AC | PRN
  Administered 2014-08-08: 150 mL via INTRAVENOUS

## 2014-08-08 MED ORDER — MORPHINE SULFATE 2 MG/ML IJ SOLN
2.0000 mg | INTRAMUSCULAR | Status: DC | PRN
  Administered 2014-08-08 – 2014-08-09 (×2): 2 mg via INTRAVENOUS
  Filled 2014-08-08 (×3): qty 1

## 2014-08-08 MED ORDER — LACTATED RINGERS IV SOLN
INTRAVENOUS | Status: DC | PRN
  Administered 2014-08-08 (×2): via INTRAVENOUS

## 2014-08-08 MED ORDER — FENTANYL CITRATE (PF) 100 MCG/2ML IJ SOLN
INTRAMUSCULAR | Status: DC | PRN
Start: 2014-08-08 — End: 2014-08-08
  Administered 2014-08-08: 50 ug via INTRAVENOUS
  Administered 2014-08-08: 25 ug via INTRAVENOUS
  Administered 2014-08-08: 100 ug via INTRAVENOUS
  Administered 2014-08-08: 50 ug via INTRAVENOUS
  Administered 2014-08-08 (×4): 25 ug via INTRAVENOUS

## 2014-08-08 MED ORDER — GLYCOPYRROLATE 0.2 MG/ML IJ SOLN
INTRAMUSCULAR | Status: DC | PRN
  Administered 2014-08-08: 0.2 mg via INTRAVENOUS
  Administered 2014-08-08: 0.6 mg via INTRAVENOUS

## 2014-08-08 MED ORDER — PHENYLEPHRINE 40 MCG/ML (10ML) SYRINGE FOR IV PUSH (FOR BLOOD PRESSURE SUPPORT)
PREFILLED_SYRINGE | INTRAVENOUS | Status: AC
  Filled 2014-08-08: qty 40

## 2014-08-08 MED ORDER — SODIUM CHLORIDE 0.9 % IJ SOLN
INTRAMUSCULAR | Status: AC
  Filled 2014-08-08: qty 10

## 2014-08-08 MED ORDER — ATORVASTATIN CALCIUM 80 MG PO TABS
80.0000 mg | ORAL_TABLET | Freq: Every evening | ORAL | Status: DC
  Administered 2014-08-08 – 2014-08-11 (×4): 80 mg via ORAL
  Filled 2014-08-08 (×4): qty 1

## 2014-08-08 MED ORDER — LABETALOL HCL 5 MG/ML IV SOLN: 10.0000 mg | INTRAVENOUS | Status: DC | PRN

## 2014-08-08 MED ORDER — FOLIC ACID 1 MG PO TABS
1.0000 mg | ORAL_TABLET | Freq: Every day | ORAL | Status: DC
  Administered 2014-08-08 – 2014-08-11 (×4): 1 mg via ORAL
  Filled 2014-08-08 (×4): qty 1

## 2014-08-08 SURGICAL SUPPLY — 43 items
CANISTER SUCTION 2500CC (MISCELLANEOUS) ×2 IMPLANT
CANNULA VESSEL 3MM 2 BLNT TIP (CANNULA) ×2 IMPLANT
CATH ROBINSON RED A/P 18FR (CATHETERS) ×2 IMPLANT
CLIP TI MEDIUM 6 (CLIP) ×2 IMPLANT
CLIP TI WIDE RED SMALL 6 (CLIP) ×2 IMPLANT
CRADLE DONUT ADULT HEAD (MISCELLANEOUS) ×2 IMPLANT
DECANTER SPIKE VIAL GLASS SM (MISCELLANEOUS) IMPLANT
DRAIN HEMOVAC 1/8 X 5 (WOUND CARE) IMPLANT
ELECT REM PT RETURN 9FT ADLT (ELECTROSURGICAL) ×2
ELECTRODE REM PT RTRN 9FT ADLT (ELECTROSURGICAL) ×1 IMPLANT
EVACUATOR SILICONE 100CC (DRAIN) IMPLANT
GAUZE SPONGE 4X4 12PLY STRL (GAUZE/BANDAGES/DRESSINGS) ×2 IMPLANT
GEL ULTRASOUND 20GR AQUASONIC (MISCELLANEOUS) IMPLANT
GLOVE BIO SURGEON STRL SZ7 (GLOVE) ×1 IMPLANT
GLOVE BIO SURGEON STRL SZ7.5 (GLOVE) ×2 IMPLANT
GLOVE BIOGEL PI IND STRL 7.5 (GLOVE) IMPLANT
GLOVE BIOGEL PI INDICATOR 7.5 (GLOVE) ×1
GOWN STRL REUS W/ TWL LRG LVL3 (GOWN DISPOSABLE) ×3 IMPLANT
GOWN STRL REUS W/TWL LRG LVL3 (GOWN DISPOSABLE) ×8
KIT BASIN OR (CUSTOM PROCEDURE TRAY) ×2 IMPLANT
KIT ROOM TURNOVER OR (KITS) ×2 IMPLANT
LIQUID BAND (GAUZE/BANDAGES/DRESSINGS) ×2 IMPLANT
LOOP VESSEL MINI RED (MISCELLANEOUS) ×1 IMPLANT
NDL HYPO 25GX1X1/2 BEV (NEEDLE) IMPLANT
NEEDLE HYPO 25GX1X1/2 BEV (NEEDLE) IMPLANT
NS IRRIG 1000ML POUR BTL (IV SOLUTION) ×4 IMPLANT
PACK CAROTID (CUSTOM PROCEDURE TRAY) ×2 IMPLANT
PAD ARMBOARD 7.5X6 YLW CONV (MISCELLANEOUS) ×4 IMPLANT
PATCH HEMASHIELD 8X75 (Vascular Products) ×1 IMPLANT
SHUNT CAROTID BYPASS 10 (VASCULAR PRODUCTS) ×1 IMPLANT
SHUNT CAROTID BYPASS 12FRX15.5 (VASCULAR PRODUCTS) IMPLANT
SPONGE INTESTINAL PEANUT (DISPOSABLE) ×2 IMPLANT
SPONGE SURGIFOAM ABS GEL 100 (HEMOSTASIS) IMPLANT
SUT ETHILON 3 0 PS 1 (SUTURE) IMPLANT
SUT PROLENE 6 0 CC (SUTURE) ×3 IMPLANT
SUT PROLENE 7 0 BV 1 (SUTURE) ×1 IMPLANT
SUT SILK 3 0 TIES 17X18 (SUTURE)
SUT SILK 3-0 18XBRD TIE BLK (SUTURE) IMPLANT
SUT VIC AB 3-0 SH 27 (SUTURE) ×2
SUT VIC AB 3-0 SH 27X BRD (SUTURE) ×1 IMPLANT
SUT VICRYL 4-0 PS2 18IN ABS (SUTURE) ×2 IMPLANT
SYR CONTROL 10ML LL (SYRINGE) IMPLANT
WATER STERILE IRR 1000ML POUR (IV SOLUTION) ×2 IMPLANT

## 2014-08-08 NOTE — Anesthesia Procedure Notes (Signed)
Procedure Name: Intubation Date/Time: 08/08/2014 7:50 AM Performed by: Scheryl Darter Pre-anesthesia Checklist: Patient identified, Emergency Drugs available, Suction available, Patient being monitored and Timeout performed Patient Re-evaluated:Patient Re-evaluated prior to inductionOxygen Delivery Method: Circle system utilized Preoxygenation: Pre-oxygenation with 100% oxygen Intubation Type: IV induction Ventilation: Mask ventilation without difficulty Laryngoscope Size: Mac and 3 Grade View: Grade I Tube type: Oral Tube size: 7.5 mm Number of attempts: 1 Airway Equipment and Method: Stylet Placement Confirmation: ETT inserted through vocal cords under direct vision,  positive ETCO2 and breath sounds checked- equal and bilateral Secured at: 22 cm Tube secured with: Tape Dental Injury: Teeth and Oropharynx as per pre-operative assessment

## 2014-08-08 NOTE — Progress Notes (Signed)
Dr Oneida Alar in to see patient. Aware of uneven BPs right  Vs. left vs. a-line. Advised to monitor and treat bp in right arm due to stenosis on left and positional arterial line.

## 2014-08-08 NOTE — Anesthesia Postprocedure Evaluation (Signed)
  Anesthesia Post-op Note  Patient: [name]  Procedure(s) Performed: Procedure(s): RIGHT CAROTID ENDARTERECTOMY WITH HEMASHIELD PATCH ANGIOPLASTY (Right)  Patient Location: PACU  Anesthesia Type:General  Level of Consciousness: awake, alert , oriented, patient cooperative and responds to stimulation  Airway and Oxygen Therapy: Patient Spontanous Breathing and Patient connected to nasal cannula oxygen  Post-op Pain: mild  Post-op Assessment: Post-op Vital signs reviewed, Patient's Cardiovascular Status Stable, Respiratory Function Stable, Patent Airway, No signs of Nausea or vomiting and Pain level controlled LLE Motor Response: Purposeful movement   RLE Motor Response: Purposeful movement        Post-op Vital Signs: Reviewed and stable  Last Vitals:  Filed Vitals:   08/08/14 1415  BP: 125/38  Pulse: 73  Temp:   Resp: 21    Complications: No apparent anesthesia complications, respiratory status markedly improved

## 2014-08-08 NOTE — Progress Notes (Signed)
Patient to CT on monitor. No change in patient's left hand and arm. Dr Oneida Alar in to see patient in CT. Patient's family updated by DR Oneida Alar. Pt's husband at bedside just piror to transport for CT.

## 2014-08-08 NOTE — Progress Notes (Addendum)
Pt still with left arm deficit.  Has been waiting on CT scan for 2 hours due to problems with IV for CT scan.  Her duplex showed a patent cervical ICA.  She has now had at least 3 hours of ischemia time with no improvement.  Still needs head CT to rule out bleed.  Will consult neurology as deficit does not appear to be resolving and will need their involvement for management of stroke.  She is not a candidate for TPA.  Family has been updated twice that she has left arm weakness and we are waiting on CT results.   Ruta Hinds, MD [facility] [place] Office: [phone] Pager: [phone]   Spoke with Dr Estanislado Pandy since unable to get IV at location appropriate for CT will no non contrast head CT followed by angio.]  Ruta Hinds, MD [facility] [place]: [phone] Pager: [phone]

## 2014-08-08 NOTE — H&P (Signed)
Brief History and Physical  History of Present Illness  [name] is a 74 y.o. female who presents with chief complaint: Left arm numbness 2-3 times per week and weakness.  The patient presents today for F/U visit after seeing Dr. Eddie Dibbles at [place].  He diagnosed her with rotator cuff impingement and gave her an interarticular left shoulder injection.  She states her shoulder feels better, but he numbness is still present.    She denies any symptoms of classic TIA amaurosis or stroke. She is currently on aspirin once daily.   She has a history of tracheostomy due to a previous illness and bowel resection.      Past Medical History   Diagnosis  Date   .  Carotid artery disease         a. 60-70% bilat ICA stenosis by dopplers 05/2012.   Marland Kitchen  Other and unspecified hyperlipidemia         takes Lipitor daily   .  CAD (coronary artery disease)         a. Mod dz 2010 initially mgd medically. b. 12/2012 - angina s/p PTCA/DES to mid-circumflex, PTCA/DES to first OM.    Marland Kitchen  Barrett's esophagus     .  SBO (small bowel obstruction)         a. Lysis of adhesions and ovarian cystectomy 04/2013 for sBO. b. Admitted 07/2013 for colonic obstruction due to suspected colitis, s/p partial colectomy/colostomy 08/01/13. Admission complicated by anasarca, acute resp failure requiring tracheostomy, decannulated 08/25/13. c. Recurrent SBO8/2015, NGT placed for decompression.    .  Colonic obstruction due to suspected colitis; s/p colectomy/colostomy         a. See SBO.   Marland Kitchen  Protein-calorie malnutrition, severe w/ electrolyte imbalance         a. Severe hypoalbuminemia leading to 3rd spacing including anasarca and pulm edema 07/2013.   Marland Kitchen  DVT of axillary vein, acute right         a. Dx 07/2013 felt due to R IJ central line that had been inserted 7/14. Not on anticoag due to GIB issues and small cerebral bleed.   .  Acute respiratory failure with hypoxia s/p tracheostomy  07/2013   .  Intracranial  hemorrhage         a. 08/11/13 - per DC summary, tiny SAH vs SDH done for altered mentation, anticoag stopped including aspirin.   .  Anemia         a. 7-08/2013 felt due to recent critical illness/chronic disease.   .  Complication of anesthesia         hard to wake up from anesthesia    .  Hypertension         takes Metoprolol daily   .  HTN (hypertension)     .  Heart murmur     .  COPD (chronic obstructive pulmonary disease)     .  Pneumonia         hx of->15 yrs ago   .  History of bronchitis         > 41yr ago    .  Dizziness         was on Bentyl which caused this-took off of it and no problems since   .  Stroke  early 80's       right sided weakness--TIA   .  Arthritis     .  Joint pain     .  Chronic back pain         deteriorating;DDD   .  Neck pain         DDD   .  History of hiatal hernia     .  History of colon polyps     .  History of kidney stones         was told it was stable but doesn't know for sure that she ever passed it   .  Depression         takes Remeron and Zoloft daily       Past Surgical History   Procedure  Laterality  Date   .  Appendectomy       .  Cholecystectomy       .  Colon resection       .  Coronary angioplasty with stent placement    12/20/2012       STENT TO OM         DR COOPER   .  Rotator cuff repair  Left     .  Abdominal hysterectomy       .  Colon surgery       .  Foot surgery  Left     .  Hand surgery  Bilateral     .  Laparotomy  N/A  08/01/2013       Procedure: EXPLORATORY LAPAROTOMY;  Surgeon: Harl Bowie, MD;  Location: [facility];  Service: General;  Laterality: N/A;   .  Partial colectomy  N/A  08/01/2013       Procedure: PARTIAL COLECTOMY;  Surgeon: Harl Bowie, MD;  Location: [facility];  Service: General;  Laterality: N/A;   .  Colostomy  N/A  08/01/2013       Procedure: COLOSTOMY;  Surgeon: Harl Bowie, MD;  Location: [facility];  Service: General;  Laterality: N/A;   .  Bowel resection  N/A  08/01/2013        Procedure: SMALL BOWEL RESECTION;  Surgeon: Harl Bowie, MD;  Location: [facility];  Service: General;  Laterality: N/A;   .  Tracheostomy           feinstein   .  Left heart catheterization with coronary angiogram  N/A  12/20/2012       Procedure: LEFT HEART CATHETERIZATION WITH CORONARY ANGIOGRAM;  Surgeon: Blane Ohara, MD;  Location: [facility] C[facility] LAB;  Service: Cardiovascular;  Laterality: N/A;   .  Cataract surgery  Bilateral     .  Colostomy takedown    01/24/2014       dr Ninfa Linden   .  Colostomy takedown  N/A  01/24/2014       Procedure: COLOSTOMY TAKEDOWN;  Surgeon: Coralie Keens, MD;  Location: [facility];  Service: General;  Laterality: N/A;       History      Social History   .  Marital Status:  Married       Spouse Name:  N/A   .  Number of Children:  N/A   .  Years of Education:  N/A      Occupational History   .  full time        Social History Main Topics   .  Smoking status:  Heavy Tobacco Smoker -- 0.50 packs/day for 53 years       Types:  Cigarettes   .  Smokeless tobacco:  Never Used  Comment: also uses e cig   .  Alcohol Use:  No   .  Drug Use:  No   .  Sexual Activity:  Not Currently       Birth Control/ Protection:  Surgical      Other Topics  Concern   .  Not on file      Social History Narrative       Family History   Problem  Relation  Age of Onset   .  Heart disease       .  Varicose Veins  Mother     .  Heart disease  Father     .  Heart attack  Father     .  AAA (abdominal aortic aneurysm)  Father         Current Outpatient Prescriptions on File Prior to Visit   Medication  Sig  Dispense  Refill   .  aspirin EC 81 MG tablet  Take 162 mg by mouth daily.       Marland Kitchen  atorvastatin (LIPITOR) 80 MG tablet  Take 80 mg by mouth every evening.        .  fish oil-omega-3 fatty acids 1000 MG capsule  Take 1,000 mg by mouth daily.        .  fluticasone (FLONASE) 50 MCG/ACT nasal spray  Place 2 sprays into both nostrils as needed. As directed         .  folic acid (FOLVITE) 299 MCG tablet  Take 800 mcg by mouth daily.        .  metoprolol succinate (TOPROL-XL) 25 MG 24 hr tablet  TAKE 1 TABLET BY MOUTH DAILY  90 tablet  3   .  mirtazapine (REMERON) 30 MG tablet  Take 1 tablet by mouth daily.       .  nitroGLYCERIN (NITROSTAT) 0.4 MG SL tablet  Place 1 tablet (0.4 mg total) under the tongue every 5 (five) minutes as needed for chest pain.  25 tablet  3   .  ondansetron (ZOFRAN) 4 MG tablet  Take 4 mg by mouth every 6 (six) hours as needed for nausea or vomiting.        .  pantoprazole (PROTONIX) 40 MG tablet  TAKE 1 TABLET TWICE DAILY  180 tablet  1   .  predniSONE (DELTASONE) 5 MG tablet  As directed          No current facility-administered medications on file prior to visit.       Allergies   Allergen  Reactions   .  Pregabalin  Swelling       Tongue swelling   .  Sulfonamide Derivatives  Swelling       Childhood reaction     Review of Systems: As listed above, otherwise negative.  Physical Examination  Filed Vitals:   08/08/14 0547  BP: 141/58  Pulse: 74  Temp: 97.7 F (36.5 C)  TempSrc: Oral  Resp: 18  Height: 5' (1.524 m)  Weight: 152 lb (68.947 kg)  SpO2: 100%    General: A&O x 3, WDWN  Pulmonary: Sym exp, good air movt, CTAB, no rales, rhonchi, & wheezing  Cardiac: RRR, Nl S1, S2, no Murmurs, rubs or gallops  Gastrointestinal: soft, NTND, -G/R, - HSM, - masses, - CVAT B  Musculoskeletal: M/S 5/5 throughout , Extremities without ischemic changes   Carotid duplex: Medical Decision Making  [name] is a 74 y.o. female who presents with: Right  carotid stenosis. She will see and ENT for vocal cord check and we will contact Dr. Meda Coffee for her verbal OK to proceed with Right CEA.  She will call once she has seen the ENT physician and we can schedule her surgery .  The patient was seen in conjunction with Dr. Carvel Getting, [place] PA-C  [place] Office: [phone]   06/28/2014,  4:29 PM  Patient still having events of numbness and tingling in her left arm. This was not improved after injection of her left shoulder. The still do not sound like TIA events. However, since she does have greater than 60% stenosis on the right side this could possibly be causing symptoms. She will need ENT evaluation for vocal cord check prior to her carotid endarterectomy since she has had previous tracheostomy. After she has had a vocal cord evaluation and we receive cardiac risk profile from Dr. Meda Coffee we will schedule her for a right carotid endarterectomy. Risks benefits possible palpitations and procedure details were explained to the patient today including but not limited to bleeding infection stroke risk of 1-2% cranial nerve injury was 10-15% she understands and agrees to proceed.  Ruta Hinds, MD [facility] Office: [phone] Pager: [phone]

## 2014-08-08 NOTE — Progress Notes (Signed)
  Day of Surgery Note    Subjective:  Hungry and has to pee  Filed Vitals:   08/08/14 1445  BP: 108/39  Pulse: 76  Temp:   Resp: 24    Incisions:   Clean and dry-no hematoma Extremities:  5/5 RUE; 0-1/5 LUE; no hand grip Cardiac:  regular Lungs:  Non labored Neuro:  Marginal mandibular palsy; no hand grip left hand; unable to raise left arm; sensation left hand in tact.  Assessment/Plan:  This is a 74 y.o. female who is s/p right CEA  -left arm weakness s/p surgery -stat right carotid duplex ordered (spoke with Sharyn Lull in the [facility] lab) -continue npo for now   Leontine Locket, PA-C 08/08/2014 3:00 PM

## 2014-08-08 NOTE — Anesthesia Preprocedure Evaluation (Signed)
Anesthesia Evaluation  Patient identified by MRN, date of birth, ID band Patient awake    Reviewed: Allergy & Precautions, NPO status , Patient's Chart, lab work & pertinent test results  History of Anesthesia Complications Negative for: history of anesthetic complications  Airway Mallampati: II  TM Distance: >3 FB Neck ROM: Full    Dental  (+) Poor Dentition, Loose, Dental Advisory Given, Missing, Chipped   Pulmonary COPDCurrent Smoker,  breath sounds clear to auscultation        Cardiovascular hypertension, Pt. on medications and Pt. on home beta blockers - angina+ CAD, + Cardiac Stents (2014 DES to Cx, OM), + Peripheral Vascular Disease (carotid stenosis) and DVT Rhythm:Regular Rate:Normal  1/16 ECHO: EF 55-60%, mild MR, mild AI, mild TR   Neuro/Psych Anxiety Depression H/o small SDH '15 TIA   GI/Hepatic GERD-  Medicated and Controlled,  Endo/Other    Renal/GU      Musculoskeletal  (+) Arthritis -,   Abdominal   Peds  Hematology  (+) Blood dyscrasia (Hb 10.7), ,   Anesthesia Other Findings   Reproductive/Obstetrics                             Anesthesia Physical Anesthesia Plan  ASA: III  Anesthesia Plan: General   Post-op Pain Management:    Induction: Intravenous  Airway Management Planned: Oral ETT  Additional Equipment: Arterial line  Intra-op Plan:   Post-operative Plan: Extubation in OR  Informed Consent: I have reviewed the patients History and Physical, chart, labs and discussed the procedure including the risks, benefits and alternatives for the proposed anesthesia with the patient or authorized representative who has indicated his/her understanding and acceptance.   Dental advisory given  Plan Discussed with: CRNA and Surgeon  Anesthesia Plan Comments: (Plan routine monitors, A line, GETA)        Anesthesia Quick Evaluation

## 2014-08-08 NOTE — Progress Notes (Signed)
Arrived in CT. CT unable to use IV that was started in right wrist, required to have IV further up arm. Awaiting IV team to start IV.  Dr Oneida Alar aware of delay

## 2014-08-08 NOTE — Progress Notes (Signed)
[facility]  Subjective  - Pt awake and following commands in PACU, has headache   Objective 116/36  Right arm BP is 30 mm Hg higher than left 71 97 F (36.1 C) (Oral) 20 100%  Intake/Output Summary (Last 24 hours) at 08/08/14 1250 Last data filed at 08/08/14 1206  Gross per 24 hour  Intake   1350 ml  Output      0 ml  Net   1350 ml   Requiring O2 mask to keep sats above 95% UE/LE motor 5/5 tongue midline following commands Dampened waveform left radial art line No neck hematoma  ABG PO2 60 mild acidosis  Assessment/Planning: Stable neurologically post op Still requiring dopamine for BP.  Follow right arm pressure only as probably has left subclavian stenosis Wean dopamine to SBP 100 Increased O2 requirement.  Chest xray shows pulmonary edema will place foley and give lasix   Ruta Hinds 08/08/2014 12:50 PM --  Laboratory Lab Results:  Recent Labs  08/08/14 1207 08/08/14 1214  HGB 10.9* 10.9*  HCT 32.0* 32.0*   BMET  Recent Labs  08/08/14 1207 08/08/14 1214  NA 142 142  K 3.8 3.9    COAG Lab Results  Component Value Date   INR 1.03 08/01/2014   INR 1.16 09/17/2013   INR 1.20 08/11/2013   No results found for: PTT

## 2014-08-08 NOTE — Progress Notes (Signed)
Called to see pt for left arm weakness.  Pt was noted to last be moving her left arm normally one hour ago.  She has no intrinsic movement of the left hand.  She is able to move her left shoulder some. She can not lift the arm against gravity.  Will emergently duplex if carotid is patent will get head CT followed by carotid angio.  Will update family on plan as soon as carotid duplex is complete.  Ruta Hinds, MD [facility] Office: [phone] Pager: [phone]

## 2014-08-08 NOTE — Sedation Documentation (Signed)
Patient is resting comfortably. 

## 2014-08-08 NOTE — Addendum Note (Signed)
Addendum  created 08/08/14 1517 by Scheryl Darter, CRNA   Modules edited: Charges VN

## 2014-08-08 NOTE — Transfer of Care (Signed)
Immediate Anesthesia Transfer of Care Note  Patient: [name]  Procedure(s) Performed: Procedure(s): RIGHT CAROTID ENDARTERECTOMY WITH HEMASHIELD PATCH ANGIOPLASTY (Right)  Patient Location: PACU  Anesthesia Type:General  Level of Consciousness: awake, oriented and sedated  Airway & Oxygen Therapy: Patient Spontanous Breathing and non-rebreather face mask  Post-op Assessment: Report given to RN, Post -op Vital signs reviewed and stable, Patient moving all extremities and Patient able to stick tongue midline  Post vital signs: Reviewed and stable  Last Vitals:  Filed Vitals:   08/08/14 1200  BP:   Pulse:   Temp: 36.1 C  Resp:     Complications: respiratory complications/

## 2014-08-08 NOTE — Progress Notes (Addendum)
*  PRELIMINARY RESULTS* Vascular Ultrasound Carotid Duplex (Doppler) has been completed.   The right internal carotid artery is patent. There is no flap or significant plaque morphology seen within the visualized portion of the right internal carotid artery. There is evidence of elevated distal right internal carotid artery velocities measuring 204/64 at its highest.  Preliminary results discussed with Dr. Oneida Alar.  08/08/2014 3:33 PM Maudry Mayhew, RVT, RDCS, RDMS

## 2014-08-08 NOTE — Op Note (Signed)
Procedure Right carotid endarterectomy  Preoperative diagnosis: High-grade symptomatic right internal carotid artery stenosis 70%  Postoperative diagnosis: Same  Anesthesia General  Asst.: Adele Barthel MD, Leontine Locket, [facility]  Operative findings: #1 greater than 70% right internal carotid artery stenosis                                #2 Dacron patch extending 2 cm distally onto the internal carotid artery and just past the hypoglossal nerve  Operative details: After obtaining informed consent, the patient was taken to the operating room. The patient was placed in a supine position on the operating room table. After induction of general anesthesia and endotracheal intubation a Foley catheter was placed. Next the patient's entire neck and chest was prepped and draped in the usual sterile fashion. An oblique incision was made on the left aspect of the patient's neck anterior to the border the leftt sternocleidomastoid muscle. The incision was carried into the subcutaneous tissues and through the platysma. The sternocleidomastoid muscle was identified and reflected laterally. The omohyoid muscle was identified and this was divided with cautery. The common facial vein was dissected free circumferentially and ligated and divided between silk ties.  The jugular vein was then reflected laterally.  The common carotid artery was then found at the base of the incision this was dissected free circumferentially. The vagus nerve was identified and protected. Dissection was then carried up to the level carotid bifurcation.   The hyperglossal nerve was draped over the internal carotid at an area of calcification. Several tethering branches of the external carotid and venous branches were ligated and divided to mobolize the nerve.  The internal carotid artery was dissected free circumferentially just above the level of the hypoglossal nerve and it was soft in character at this location and above any palpable disease. A  vessel loop was placed around this. Next the external carotid and superior thyroid arteries were dissected free circumferentially and vessel loops were placed around these. There was also an ascending pharyngeal branch which was also controlled with a vessel loop.  The patient was given 7000 units of intravenous heparin. After 2 minutes of circulation time and raising the mean arterial pressure to 85 mm mercury, the distal internal carotid artery was controlled with a baby Gregory clamp. The external carotid and superior thyroid arteries were controlled with vessel loops. The common carotid artery was controlled with a peripheral DeBakey clamp. A longitudinal opening was made in the common carotid artery just below the bifurcation. The arteriotomy was extended distally up into the internal carotid with Potts scissors. There was a large calcified plaque with greater than 70% stenosis in the carotid bifurcation extending into the internal carotid.  The arteriotomy was extended past the level of stenosis.  A 10 Fr shunt was threaded into the distal internal carotid but this would not pass easily most likely to to calcification on the posterior wall of the vessel.  There was pulsatile backbleeding from the internal and I did not feel it was safe to pass a shunt.  The distal internal was controlled with the baby Belenda Cruise and I began the endarterectomy maintaining the mean arterial > 90 during this portion of the case.  A suitable endarterectomy plane was obtained and endarterectomy was begun in the common carotid artery and a reasonable proximal endpoint. An eversion endarterectomy was performed on the external carotid artery and a good endpoint was obtained. The plaque  was then elevated in the internal carotid artery and a nice feathered distal endpoint was also obtained although a posterior tongue of plaque did extend above the hypoglossal I was able to get a good endpoint under direct visualization.  The plaque was  passed off the table. All loose debris was then removed from the carotid bed and everything was thoroughly irrigated with heparinized saline. A Dacron patch was then brought on to the operative field and this was sewn on as a patch angioplasty using a running 6-0 Prolene suture. Prior to completion of the anastomosis the internal carotid artery was thoroughly backbled. This was then controlled again with a find bulldog clamp.  The common carotid was thoroughly flushed forward. The external carotid was also thoroughly backbled.  The remainder of the patch was completed and the anastomosis was secured. Flow was then restored first retrograde from the external carotid into the carotid bed then antegrade from the common carotid to the external carotid artery and after approximately 5 cardiac cycles to the internal carotid artery. Doppler was used to evaluate the external/internal and common carotid arteries and these all had good Doppler flow. The doppler flow to the internal was slightly higher pitched but there was good diastolic flow.  Hemostasis was obtained with 70 mg of Protamine and 1 repair stitch   The platysma muscle was reapproximated using a running 3-0 Vicryl suture. The skin was closed with 4 0 Vicryl subcuticular stitch.  The patient was awakened in the operating room and was moving upper and lower extremities symmetrically and following commands.  The patient was stable on arrival to the PACU.  Ruta Hinds, MD [facility] Office: [phone] Pager: [phone]

## 2014-08-08 NOTE — Progress Notes (Addendum)
UR COMPLETED  

## 2014-08-08 NOTE — Sedation Documentation (Signed)
Patient restless, states nausea isbetter

## 2014-08-08 NOTE — Consult Note (Signed)
Stroke Consult    Chief Complaint: LUE weakness  HPI: [name] is an 74 y.o. female hx of carotid stenosis, CAD, HTN presenting for right carotid endarterectomy with subsequent development of left sided weakness post procedure. Patient with 2-3 week history of LUE numbness and weakness, question of rotator cuff impingement. No improvement with injection so presented today for right endarterectomy. Tolerated procedure well, awoke with no neurological deficits. Shortly after (1-2 hrs) noted to have acute onset of left arm weakness distal > proximal along with decreased sensation. Stat carotid doppler of R ICA was unremarkable. CT head was completed, imaging reviewed shows possible acute right MCA distribution infarct.   She was taken to neuro-IR for diagnostic angiogram which showed "wide patency of right endarterectomy site and no gross filling defects or occlusion.   Date last known well: 08/08/2014 Time last known well: 1400 tPA Given: no, recent surgery Modified Rankin: Rankin Score=0  Past Medical History  Diagnosis Date  . Carotid artery disease     a. 60-70% bilat ICA stenosis by dopplers 05/2012.  Marland Kitchen Other and unspecified hyperlipidemia     takes Lipitor daily  . CAD (coronary artery disease)     a. Mod dz 2010 initially mgd medically. b. 12/2012 - angina s/p PTCA/DES to mid-circumflex, PTCA/DES to first OM.   Marland Kitchen Barrett's esophagus   . SBO (small bowel obstruction)     a. Lysis of adhesions and ovarian cystectomy 04/2013 for sBO. b. Admitted 07/2013 for colonic obstruction due to suspected colitis, s/p partial colectomy/colostomy 08/01/13. Admission complicated by anasarca, acute resp failure requiring tracheostomy, decannulated 08/25/13. c. Recurrent SBO8/2015, NGT placed for decompression.   . Colonic obstruction due to suspected colitis; s/p colectomy/colostomy     a. See SBO.  Marland Kitchen Protein-calorie malnutrition, severe w/ electrolyte imbalance     a. Severe hypoalbuminemia leading  to 3rd spacing including anasarca and pulm edema 07/2013.  Marland Kitchen DVT of axillary vein, acute right     a. Dx 07/2013 felt due to R IJ central line that had been inserted 7/14. Not on anticoag due to GIB issues and small cerebral bleed.  . Acute respiratory failure with hypoxia s/p tracheostomy 07/2013  . Intracranial hemorrhage     a. 08/11/13 - per DC summary, tiny SAH vs SDH done for altered mentation, anticoag stopped including aspirin.  . Anemia     a. 7-08/2013 felt due to recent critical illness/chronic disease.  . Complication of anesthesia     hard to wake up from anesthesia   . Hypertension     takes Metoprolol daily  . HTN (hypertension)   . Heart murmur   . COPD (chronic obstructive pulmonary disease)   . Pneumonia     hx of->15 yrs ago  . History of bronchitis     > 34yr ago   . Dizziness     was on Bentyl which caused this-took off of it and no problems since  . Stroke early 80's    right sided weakness--TIA  . Joint pain   . Chronic back pain     deteriorating;DDD  . Neck pain     DDD  . History of hiatal hernia   . History of colon polyps   . History of kidney stones     was told it was stable but doesn't know for sure that she ever passed it  . Depression     takes Remeron and Zoloft daily  . GERD (gastroesophageal reflux disease)   .  Arthritis     handds, knees & back     Past Surgical History  Procedure Laterality Date  . Appendectomy    . Cholecystectomy    . Colon resection    . 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 Rotator cuff repair Left   . Abdominal hysterectomy    . Colon surgery    . Foot surgery Left   . Hand surgery Bilateral     for ganglion cysts  . Laparotomy N/A 08/01/2013    Procedure: EXPLORATORY LAPAROTOMY;  Surgeon: Harl Bowie, MD;  Location: [place];  Service: General;  Laterality: N/A;  . Partial colectomy N/A 08/01/2013    Procedure: PARTIAL COLECTOMY;  Surgeon: Harl Bowie, MD;  Location: [place];  Service: General;  Laterality: N/A;  . Colostomy N/A 08/01/2013    Procedure: COLOSTOMY;  Surgeon: Harl Bowie, MD;  Location: [place];  Service: General;  Laterality: N/A;  . Bowel resection N/A 08/01/2013    Procedure: SMALL BOWEL RESECTION;  Surgeon: Harl Bowie, MD;  Location: [place];  Service: General;  Laterality: N/A;  . Tracheostomy      feinstein  . Left heart catheterization with coronary angiogram N/A 12/20/2012    Procedure: LEFT HEART CATHETERIZATION WITH CORONARY ANGIOGRAM;  Surgeon: Blane Ohara, MD;  Location: [facility] CATH LAB;  Service: Cardiovascular;  Laterality: N/A;  . Cataract surgery Bilateral   . Colostomy takedown  01/24/2014    dr Ninfa Linden  . Colostomy takedown N/A 01/24/2014    Procedure: COLOSTOMY TAKEDOWN;  Surgeon: Coralie Keens, MD;  Location: [place];  Service: General;  Laterality: N/A;  . Tonsillectomy      Family History  Problem Relation Age of Onset  . Heart disease    . Varicose Veins Mother   . Heart disease Father   . Heart attack Father   . AAA (abdominal aortic aneurysm) Father    Social History:  reports that she has been smoking Cigarettes.  She has a 26.5 pack-year smoking history. She has never used smokeless tobacco. She reports that she does not drink alcohol or use illicit drugs.  Allergies:  Allergies  Allergen Reactions  . Pregabalin Swelling    Tongue swelling  . Sulfonamide Derivatives Swelling    Childhood reaction    Medications Prior to Admission  Medication Sig Dispense Refill  . aspirin EC 81 MG tablet Take 162 mg by mouth daily.    Marland Kitchen atorvastatin (LIPITOR) 80 MG tablet Take 80 mg by mouth every evening.     . docusate sodium (COLACE) 100 MG capsule Take 100 mg by mouth 2 (two) times daily.    . fish oil-omega-3 fatty acids 1000 MG capsule Take 1,000 mg by mouth daily.     . folic acid (FOLVITE) 017 MCG tablet Take 800 mcg by mouth daily.     . metoprolol succinate (TOPROL-XL)  25 MG 24 hr tablet TAKE 1 TABLET BY MOUTH DAILY (Patient taking differently: TAKE 1 TABLET BY MOUTH DAILY  in the a.m.) 90 tablet 3  . mirtazapine (REMERON) 30 MG tablet Take 1 tablet by mouth at bedtime.     . nitroGLYCERIN (NITROSTAT) 0.4 MG SL tablet Place 1 tablet (0.4 mg total) under the tongue every 5 (five) minutes as needed for chest pain. 25 tablet 3  . ondansetron (ZOFRAN) 4 MG tablet Take 4 mg by mouth  every 6 (six) hours as needed for nausea or vomiting.     . pantoprazole (PROTONIX) 40 MG tablet TAKE 1 TABLET TWICE DAILY 180 tablet 1  . sertraline (ZOLOFT) 100 MG tablet Take 100 mg by mouth 2 (two) times daily.      ROS: Out of a complete 14 system review, the patient complains of only the following symptoms, and all other reviewed systems are negative. + LUE weakness  Physical Examination: Filed Vitals:   08/08/14 1922  BP: 109/46  Pulse: 86  Temp:   Resp: 22   Physical Exam  Constitutional: He appears well-developed and well-nourished.  Psych: Affect appropriate to situation Eyes: No scleral injection HENT: No OP obstrucion Head: Normocephalic.  Cardiovascular: Normal rate and regular rhythm.  Respiratory: Effort normal and breath sounds normal.  GI: Soft. Bowel sounds are normal. No distension. There is no tenderness.  Skin: WDI  Neurologic Examination: Mental Status: Alert, oriented, thought content appropriate.  Speech fluent without evidence of aphasia. Moderate dysarthria.  Able to follow multi step commands without difficulty. Cranial Nerves: II: funduscopic exam wnl bilaterally, visual fields grossly normal, pupils equal, round, reactive to light and accommodation III,IV, VI: ptosis not present, extra-ocular motions intact bilaterally V,VII: weakness left face with activation, facial light touch sensation normal bilaterally VIII: hearing normal bilaterally IX,X: gag reflex present XI: trapezius strength/neck flexion strength normal bilaterally XII:  tongue strength normal  Motor: RUE 5/5 strength Flaccid LUE Deferred LE testing due to IR procedure Tone and bulk:normal tone throughout; no atrophy noted Sensory: decreased PP and LT LUE compared to right Deep Tendon Reflexes: 2+ and symmetric throughout Plantars: Right: downgoing   Left: downgoing Cerebellar: Normal FTN on right, unable to test on left Gait: deferred  Laboratory Studies:   Basic Metabolic Panel:  Recent Labs Lab 08/08/14 1207 08/08/14 1214  NA 142 142  K 3.8 3.9    Liver Function Tests: No results for input(s): AST, ALT, ALKPHOS, BILITOT, PROT, ALBUMIN in the last 168 hours. No results for input(s): LIPASE, AMYLASE in the last 168 hours. No results for input(s): AMMONIA in the last 168 hours.  CBC:  Recent Labs Lab 08/08/14 1207 08/08/14 1214  HGB 10.9* 10.9*  HCT 32.0* 32.0*    Cardiac Enzymes: No results for input(s): CKTOTAL, CKMB, CKMBINDEX, TROPONINI in the last 168 hours.  BNP: Invalid input(s): POCBNP  CBG: No results for input(s): GLUCAP in the last 168 hours.  Microbiology: Results for orders placed or performed during the hospital encounter of 08/01/14  Surgical pcr screen     Status: None   Collection Time: 08/01/14  3:48 PM  Result Value Ref Range Status   MRSA, PCR NEGATIVE NEGATIVE Final   Staphylococcus aureus NEGATIVE NEGATIVE Final    Comment:        The Xpert SA Assay (FDA approved for NASAL specimens in patients over 56 years of age), is one component of a comprehensive surveillance program.  Test performance has been validated by [facility] for patients greater than or equal to 77 year old. It is not intended to diagnose infection nor to guide or monitor treatment.     Coagulation Studies: No results for input(s): LABPROT, INR in the last 72 hours.  Urinalysis: No results for input(s): COLORURINE, LABSPEC, PHURINE, GLUCOSEU, HGBUR, BILIRUBINUR, KETONESUR, PROTEINUR, UROBILINOGEN, NITRITE, LEUKOCYTESUR in  the last 168 hours.  Invalid input(s): APPERANCEUR  Lipid Panel:     Component Value Date/Time   CHOL  05/04/2008 6767  137        ATP III CLASSIFICATION:  <200     mg/dL   Desirable  200-239  mg/dL   Borderline High  >=240    mg/dL   High          TRIG 108 08/08/2013 0500   HDL 43 05/04/2008 0611   CHOLHDL 3.2 05/04/2008 0611   VLDL 10 05/04/2008 0611   LDLCALC  05/04/2008 0611    84        Total Cholesterol/HDL:CHD Risk Coronary Heart Disease Risk Table                     Men   Women  1/2 Average Risk   3.4   3.3  Average Risk       5.0   4.4  2 X Average Risk   9.6   7.1  3 X Average Risk  23.4   11.0        Use the calculated Patient Ratio above and the CHD Risk Table to determine the patient's CHD Risk.        ATP III CLASSIFICATION (LDL):  <100     mg/dL   Optimal  100-129  mg/dL   Near or Above                    Optimal  130-159  mg/dL   Borderline  160-189  mg/dL   High  >190     mg/dL   Very High    HgbA1C:  Lab Results  Component Value Date   HGBA1C 6.4* 01/17/2014    Urine Drug Screen:  No results found for: LABOPIA, COCAINSCRNUR, LABBENZ, AMPHETMU, THCU, LABBARB  Alcohol Level: No results for input(s): ETH in the last 168 hours.   Imaging: Dg Chest 2 View  08/08/2014   CLINICAL DATA:  Preoperative chest radiograph for carotid endarterectomy. Initial encounter.  EXAM: CHEST  2 VIEW  COMPARISON:  Chest radiograph performed 02/01/2014  FINDINGS: The lungs are well-aerated. Mild bibasilar atelectasis is noted. There is no evidence of pleural effusion or pneumothorax.  The heart is borderline normal in size. No acute osseous abnormalities are seen. Clips are noted within the right upper quadrant, reflecting prior cholecystectomy.  IMPRESSION: Mild bibasilar atelectasis noted; lungs otherwise clear.   Electronically Signed   By: Garald Balding M.D.   On: 08/08/2014 06:20   Ct Head Wo Contrast  08/08/2014   CLINICAL DATA:  Postop right carotid  endarterectomy, performed earlier today. When patient awoke from anesthesia, she was unable to move the left upper extremity.  EXAM: CT HEAD WITHOUT CONTRAST  TECHNIQUE: Contiguous axial images were obtained from the base of the skull through the vertex without intravenous contrast.  COMPARISON:  09/22/2013 and earlier.  FINDINGS: Ventricular system normal in size and appearance for age. Rule core vague low-attenuation involving the right parietal cortex approaching the vertex, not seen on prior examinations. No associated hemorrhage or hematoma. No extra-axial fluid collections. No associated mass effect or midline shift. Ventricular system normal in size and appearance for age.  No skull fracture or other focal osseous abnormality involving the skull. Mucous retention cyst or polyp in the left maxillary sinus. Remaining visualized paranasal sinuses, bilateral mastoid air cells and bilateral middle ear cavities well aerated. Severe bilateral carotid siphon and vertebrobasilar atherosclerosis.  IMPRESSION: 1. Possible acute nonhemorrhagic stroke in the posterior right middle cerebral artery distribution involving the right parietal lobe near the vertex. MRI with  diffusion imaging may be confirmatory. 2. No acute intracranial abnormality otherwise. Critical Value/emergent results were called by telephone at the time of interpretation on 08/08/2014 at 5:51 pm to the post anesthesia care unit, and I was told that the patient has been taken to the neurointerventional radiology suite for diagnosis and treatment.   Electronically Signed   By: Evangeline Dakin M.D.   On: 08/08/2014 17:56   Dg Chest Port 1 View  08/08/2014   CLINICAL DATA:  Post right carotid endarterectomy.  EXAM: PORTABLE CHEST - 1 VIEW  COMPARISON:  08/08/2014  FINDINGS: Diffuse airspace disease scratch head diffuse interstitial and airspace opacities throughout the lungs, right greater than left. There is cardiomegaly. These findings are new since  earlier and presumably reflect edema. No visible significant effusions. No acute bony abnormality.  IMPRESSION: New diffuse interstitial and alveolar opacities, right greater than left, lot most likely asymmetric edema.   Electronically Signed   By: Rolm Baptise M.D.   On: 08/08/2014 12:29    Assessment: 74 y.o. female hx of carotid stenosis, CAD, HTN s/p right carotid endarterectomy now with left sided weakness. CT head imaging reviewed, shows possible R MCA infarct. Diagnostic angiogram shows open R ICA and no signs of occlusion or stenosis. Will need stroke workup.   Plan: 1. HgbA1c, fasting lipid panel 2. MRI,  of the brain without contrast 3. PT consult, OT consult, Speech consult 4. Echocardiogram 5. Carotid dopplers recently completed, along with angiogram 6. Prophylactic therapy-continue ASA '162mg'$  daily per primary team 7. Risk factor modification 8. Telemetry monitoring 9. Frequent neuro checks 10. NPO until RN stroke swallow screen    Jim Like, DO [facility] [phone]  If 7pm- 7am, please page neurology on call as listed in [place]. 08/08/2014, 7:43 PM

## 2014-08-08 NOTE — Progress Notes (Signed)
Dr Oneida Alar in to assess patient; Patient not able to move left hand, arm, reduced sensation. Duplex scan at bedside. Patient no to have head CT>

## 2014-08-08 NOTE — Procedures (Signed)
S/P Rt common carotid arteriogram. Rt CFa approach. Findings. 1.Wide patency of Rt end endarterectomy site . 2No gross filling defects or occlusions noted

## 2014-08-09 ENCOUNTER — Inpatient Hospital Stay (HOSPITAL_COMMUNITY): Payer: Commercial Managed Care - HMO

## 2014-08-09 DIAGNOSIS — I6521 Occlusion and stenosis of right carotid artery: Principal | ICD-10-CM

## 2014-08-09 DIAGNOSIS — I6789 Other cerebrovascular disease: Secondary | ICD-10-CM

## 2014-08-09 DIAGNOSIS — I63411 Cerebral infarction due to embolism of right middle cerebral artery: Secondary | ICD-10-CM

## 2014-08-09 DIAGNOSIS — I10 Essential (primary) hypertension: Secondary | ICD-10-CM

## 2014-08-09 DIAGNOSIS — Z9889 Other specified postprocedural states: Secondary | ICD-10-CM

## 2014-08-09 DIAGNOSIS — E785 Hyperlipidemia, unspecified: Secondary | ICD-10-CM

## 2014-08-09 LAB — BASIC METABOLIC PANEL
Anion gap: 9 (ref 5–15)
BUN: 16 mg/dL (ref 6–20)
CHLORIDE: 104 mmol/L (ref 101–111)
CO2: 25 mmol/L (ref 22–32)
CREATININE: 0.68 mg/dL (ref 0.44–1.00)
Calcium: 8.2 mg/dL — ABNORMAL LOW (ref 8.9–10.3)
GLUCOSE: 128 mg/dL — AB (ref 65–99)
POTASSIUM: 3.6 mmol/L (ref 3.5–5.1)
Sodium: 138 mmol/L (ref 135–145)

## 2014-08-09 LAB — LIPID PANEL
Cholesterol: 95 mg/dL (ref 0–200)
HDL: 36 mg/dL — ABNORMAL LOW (ref >40)
LDL CALC: 40 mg/dL (ref 0–99)
TRIGLYCERIDES: 97 mg/dL (ref <150)
Total CHOL/HDL Ratio: 2.6 RATIO
VLDL: 19 mg/dL (ref 0–40)

## 2014-08-09 LAB — CBC
HCT: 28.5 % — ABNORMAL LOW (ref 36.0–46.0)
HEMOGLOBIN: 9.1 g/dL — AB (ref 12.0–15.0)
MCH: 25.6 pg — ABNORMAL LOW (ref 26.0–34.0)
MCHC: 31.9 g/dL (ref 30.0–36.0)
MCV: 80.3 fL (ref 78.0–100.0)
PLATELETS: 207 10*3/uL (ref 150–400)
RBC: 3.55 MIL/uL — ABNORMAL LOW (ref 3.87–5.11)
RDW: 16.5 % — AB (ref 11.5–15.5)
WBC: 9.8 10*3/uL (ref 4.0–10.5)

## 2014-08-09 LAB — TSH: TSH: 0.69 u[IU]/mL (ref 0.350–4.500)

## 2014-08-09 LAB — T4, FREE: FREE T4: 1 ng/dL (ref 0.61–1.12)

## 2014-08-09 MED ORDER — HEPARIN SODIUM (PORCINE) 5000 UNIT/ML IJ SOLN
5000.0000 [IU] | Freq: Three times a day (TID) | INTRAMUSCULAR | Status: DC
  Administered 2014-08-09 – 2014-08-11 (×7): 5000 [IU] via SUBCUTANEOUS
  Filled 2014-08-09 (×7): qty 1

## 2014-08-09 NOTE — Evaluation (Signed)
Speech Language Pathology Evaluation Patient Details Name: [name] MRN: [id number] DOB: Jan 01, 1941 Today's Date: 08/09/2014 Time:  -     Problem List:  Patient Active Problem List   Diagnosis Date Noted  . Carotid artery stenosis, symptomatic 08/08/2014  . Carotid disease, bilateral 06/02/2014  . Coronary artery disease due to lipid rich plaque 06/02/2014  . Essential hypertension 06/02/2014  . Hyperlipidemia 06/02/2014  . Acute respiratory failure   . S/P colostomy takedown 01/24/2014  . Chronic diastolic CHF (congestive heart failure), NYHA class 2 11/10/2013  . Atypical chest pain 10/20/2013  . Malnutrition of moderate degree 09/18/2013  . SBO (small bowel obstruction) 09/17/2013  . H/O tracheostomy 09/17/2013  . Acute respiratory failure with hypoxia s/p tracheostomy 08/19/2013  . Physical deconditioning 08/19/2013  . HTN (hypertension) 08/19/2013  . Dehydration with hypernatremia 08/19/2013  . DVT of axillary vein, acute right 08/19/2013  . Anemia 08/19/2013  . Anasarca 08/14/2013  . Hypokalemia 07/31/2013  . Hypomagnesemia 07/31/2013  . Protein-calorie malnutrition, severe w/ electrolyte imbalance 07/31/2013  . Colonic obstruction due to suspected colitis; s/p colectomy/colostomy 07/29/2013  . CAD (coronary artery disease)   . Depression   . Barrett's esophagus   . Arthritis   . Other and unspecified angina pectoris 12/20/2012  . Chest pain 12/14/2012  . TOBACCO ABUSE 11/08/2009  . Hyperlipidemia, mixed 04/17/2008  . Carotid artery disease 04/17/2008   Past Medical History:  Past Medical History  Diagnosis Date  . Carotid artery disease     a. 60-70% bilat ICA stenosis by dopplers 05/2012.  Marland Kitchen Other and unspecified hyperlipidemia     takes Lipitor daily  . CAD (coronary artery disease)     a. Mod dz 2010 initially mgd medically. b. 12/2012 - angina s/p PTCA/DES to mid-circumflex, PTCA/DES to first OM.   Marland Kitchen Barrett's esophagus   . SBO (small bowel  obstruction)     a. Lysis of adhesions and ovarian cystectomy 04/2013 for sBO. b. Admitted 07/2013 for colonic obstruction due to suspected colitis, s/p partial colectomy/colostomy 08/01/13. Admission complicated by anasarca, acute resp failure requiring tracheostomy, decannulated 08/25/13. c. Recurrent SBO8/2015, NGT placed for decompression.   . Colonic obstruction due to suspected colitis; s/p colectomy/colostomy     a. See SBO.  Marland Kitchen Protein-calorie malnutrition, severe w/ electrolyte imbalance     a. Severe hypoalbuminemia leading to 3rd spacing including anasarca and pulm edema 07/2013.  Marland Kitchen DVT of axillary vein, acute right     a. Dx 07/2013 felt due to R IJ central line that had been inserted 7/14. Not on anticoag due to GIB issues and small cerebral bleed.  . Acute respiratory failure with hypoxia s/p tracheostomy 07/2013  . Intracranial hemorrhage     a. 08/11/13 - per DC summary, tiny SAH vs SDH done for altered mentation, anticoag stopped including aspirin.  . Anemia     a. 7-08/2013 felt due to recent critical illness/chronic disease.  . Complication of anesthesia     hard to wake up from anesthesia   . Hypertension     takes Metoprolol daily  . HTN (hypertension)   . Heart murmur   . COPD (chronic obstructive pulmonary disease)   . Pneumonia     hx of->15 yrs ago  . History of bronchitis     > 68yr ago   . Dizziness     was on Bentyl which caused this-took off of it and no problems since  . Stroke early 80's    right sided weakness--TIA  .  Joint pain   . Chronic back pain     deteriorating;DDD  . Neck pain     DDD  . History of hiatal hernia   . History of colon polyps   . History of kidney stones     was told it was stable but doesn't know for sure that she ever passed it  . Depression     takes Remeron and Zoloft daily  . GERD (gastroesophageal reflux disease)   . Arthritis     handds, knees & back    Past Surgical History:  Past Surgical History  Procedure Laterality  Date  . Appendectomy    . Cholecystectomy    . Colon resection    . 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 Rotator cuff repair Left   . Abdominal hysterectomy    . Colon surgery    . Foot surgery Left   . Hand surgery Bilateral     for ganglion cysts  . Laparotomy N/A 08/01/2013    Procedure: EXPLORATORY LAPAROTOMY;  Surgeon: Harl Bowie, MD;  Location: [place];  Service: General;  Laterality: N/A;  . Partial colectomy N/A 08/01/2013    Procedure: PARTIAL COLECTOMY;  Surgeon: Harl Bowie, MD;  Location: [place];  Service: General;  Laterality: N/A;  . Colostomy N/A 08/01/2013    Procedure: COLOSTOMY;  Surgeon: Harl Bowie, MD;  Location: [place];  Service: General;  Laterality: N/A;  . Bowel resection N/A 08/01/2013    Procedure: SMALL BOWEL RESECTION;  Surgeon: Harl Bowie, MD;  Location: [facility];  Service: General;  Laterality: N/A;  . Tracheostomy      feinstein  . Left heart catheterization with coronary angiogram N/A 12/20/2012    Procedure: LEFT HEART CATHETERIZATION WITH CORONARY ANGIOGRAM;  Surgeon: Blane Ohara, MD;  Location[facility];  Service: Cardiovascular;  Laterality: N/A;  . Cataract surgery Bilateral   . Colostomy takedown  01/24/2014    dr Ninfa Linden  . Colostomy takedown N/A 01/24/2014    Procedure: COLOSTOMY TAKEDOWN;  Surgeon: Coralie Keens, MD;  Location: [place];  Service: General;  Laterality: N/A;  . Tonsillectomy     HPI:  [name] is an 74 y.o. female hx of carotid stenosis, CAD, HTN presenting for right carotid endarterectomy with subsequent development of left sided weakness post procedure. Patient with 2-3 week history of LUE numbness and weakness, question of rotator cuff impingement. No improvement with injection so underwent right endarterectomy. Tolerated procedure well, awoke with no neurological deficits. Shortly after (1-2 hrs) noted to have acute onset of left arm  weakness distal > proximal along with decreased sensation. Stat carotid doppler of R ICA was unremarkable. CT head was completed, imaging reviewed shows possible acute right MCA distribution infarct.   Assessment / Plan / Recommendation Clinical Impression  Pt presents with adequate language comprehension and expression, mild dysarthria, possibly baseline, and higher level cognitive deficits. Pt requires cues for verbal and functional problem solving of moderate complexity and needs cues to verbalize new learning. Deficits may be due to lethargy; will f/u for further diagnostic testing to determine if f/u with SLP is needed at d/c.    Also, pt passed stroke swallow screen and will progress to clears due to nausea following anesthesia.  Pt was seen by SLP last year for swallowing, but found to have normal swallow function on MBS  despite trach (now decannulated).  Observed pt consume thin liquids without difficulty, but she has not attempted solids which may cause difficulty after R CEA. RN will order SLP if concerns arise with diet advancement.     SLP Assessment  Patient needs continued Speech Lanaguage Pathology Services    Follow Up Recommendations  Inpatient Rehab    Frequency and Duration min 1 x/week  2 weeks   Pertinent Vitals/Pain     SLP Goals  Potential to Achieve Goals (ACUTE ONLY): Good  SLP Evaluation Prior Functioning  Cognitive/Linguistic Baseline: Within functional limits Type of Home: House  Lives With: Spouse Available Help at Discharge: Family;Available 24 hours/day   Cognition  Overall Cognitive Status: Impaired/Different from baseline Arousal/Alertness: Awake/alert Orientation Level: Oriented X4 Attention: Focused;Sustained Focused Attention: Appears intact Sustained Attention: Impaired Sustained Attention Impairment: Verbal complex;Functional complex Memory: Impaired Memory Impairment: Decreased recall of new information;Retrieval deficit Awareness: Appears  intact Problem Solving: Impaired Problem Solving Impairment: Verbal complex;Functional complex Executive Function: Reasoning Reasoning: Impaired Reasoning Impairment: Verbal complex;Functional complex Safety/Judgment: Appears intact    Comprehension  Auditory Comprehension Overall Auditory Comprehension: Appears within functional limits for tasks assessed    Expression Verbal Expression Overall Verbal Expression: Appears within functional limits for tasks assessed   Oral / Motor Oral Motor/Sensory Function Overall Oral Motor/Sensory Function: Impaired Labial ROM: Reduced right Labial Symmetry: Abnormal symmetry right Labial Strength: Within Functional Limits Labial Sensation: Within Functional Limits Lingual ROM: Within Functional Limits Lingual Symmetry: Within Functional Limits Lingual Strength: Within Functional Limits Lingual Sensation: Within Functional Limits Facial ROM: Within Functional Limits Facial Symmetry: Within Functional Limits Facial Strength: Within Functional Limits Facial Sensation: Within Functional Limits Motor Speech Overall Motor Speech: Impaired Respiration: Within functional limits Phonation: Hoarse Resonance: Within functional limits Articulation: Impaired Level of Impairment: Conversation Intelligibility: Intelligible Motor Planning: Witnin functional limits Motor Speech Errors: Aware Interfering Components: Inadequate dentition   GO    Herbie Baltimore, MA CCC-SLP [phone]  Lynann Beaver 08/09/2014, 8:51 AM

## 2014-08-09 NOTE — Progress Notes (Signed)
STROKE TEAM PROGRESS NOTE   HISTORY [name] is an 74 y.o. female hx of carotid stenosis, CAD, HTN presenting for right carotid endarterectomy with subsequent development of left sided weakness post procedure. Patient with 2-3 week history of LUE numbness and weakness, question of rotator cuff impingement. No improvement with injection so presented today for right endarterectomy. Tolerated procedure well, awoke with no neurological deficits. Shortly after (1-2 hrs) noted to have acute onset of left arm weakness distal > proximal along with decreased sensation. Stat carotid doppler of R ICA was unremarkable. CT head was completed, imaging reviewed shows possible acute right MCA distribution infarct. She was taken to neuro-IR for diagnostic angiogram which showed "wide patency of right endarterectomy site and no gross filling defects or occlusion. She ws last known well 08/08/2014 at 1400. Modified Rankin: Rankin Score=0. Patient was not administered TPA secondary to recent surgery.    SUBJECTIVE (INTERVAL HISTORY) Her husband is at the bedside.  Overall she feels her condition is unchanged, she still cannot move L arm. Says L leg ok.   OBJECTIVE Temp:  [97 F (36.1 C)-99.4 F (37.4 C)] 99.2 F (37.3 C) (07/20 0426) Pulse Rate:  [68-149] 92 (07/20 0900) Cardiac Rhythm:  [-] Normal sinus rhythm (07/20 0800) Resp:  [17-31] 20 (07/20 0900) BP: (74-180)/(29-64) 133/40 mmHg (07/20 0900) SpO2:  [91 %-100 %] 93 % (07/20 0800) Arterial Line BP: (49-94)/(34-58) 50/41 mmHg (07/20 0400) Weight:  [69.4 kg (153 lb)] 69.4 kg (153 lb) (07/19 1804)  No results for input(s): GLUCAP in the last 168 hours.  Recent Labs Lab 08/08/14 1207 08/08/14 1214 08/09/14 0400  NA 142 142 138  K 3.8 3.9 3.6  CL  --   --  104  CO2  --   --  25  GLUCOSE  --   --  128*  BUN  --   --  16  CREATININE  --   --  0.68  CALCIUM  --   --  8.2*   No results for input(s): AST, ALT, ALKPHOS, BILITOT, PROT, ALBUMIN  in the last 168 hours.  Recent Labs Lab 08/08/14 1207 08/08/14 1214 08/09/14 0400  WBC  --   --  9.8  HGB 10.9* 10.9* 9.1*  HCT 32.0* 32.0* 28.5*  MCV  --   --  80.3  PLT  --   --  207   No results for input(s): CKTOTAL, CKMB, CKMBINDEX, TROPONINI in the last 168 hours. No results for input(s): LABPROT, INR in the last 72 hours. No results for input(s): COLORURINE, LABSPEC, [place], GLUCOSEU, HGBUR, BILIRUBINUR, KETONESUR, PROTEINUR, UROBILINOGEN, NITRITE, LEUKOCYTESUR in the last 72 hours.  Invalid input(s): APPERANCEUR     Component Value Date/Time   CHOL 95 08/09/2014 0400   TRIG 97 08/09/2014 0400   HDL 36* 08/09/2014 0400   CHOLHDL 2.6 08/09/2014 0400   VLDL 19 08/09/2014 0400   LDLCALC 40 08/09/2014 0400   Lab Results  Component Value Date   HGBA1C 6.4* 01/17/2014   No results found for: LABOPIA, COCAINSCRNUR, LABBENZ, AMPHETMU, THCU, LABBARB  No results for input(s): ETH in the last 168 hours.  Dg Chest 2 View 08/08/2014   Mild bibasilar atelectasis noted; lungs otherwise clear.     Dg Chest Port 1 View 08/08/2014    New diffuse interstitial and alveolar opacities, right greater than left, lot most likely asymmetric edema.   Ct Head Wo Contrast 08/08/2014   1. Possible acute nonhemorrhagic stroke in the posterior right middle cerebral  artery distribution involving the right parietal lobe near the vertex. MRI with diffusion imaging may be confirmatory. 2. No acute intracranial abnormality otherwise.   MRI 08/09/2014  Acute right MCA territory infarcts, predominantly involving the parietal and posterior frontal lobes.  CUS 08/08/14 The right internal carotid artery is patent. There is no flap or significant plaque morphology seen within the visualized portion of the right internal carotid artery. There is evidence of elevated distal right internal carotid artery velocities measuring 204/64 at its highest. The right vertebral artery is patent with  antegrade flow.  Cerebral angio  1.Wide patency of Rt end endarterectomy site . 2. No gross filling defects or occlusions noted  2D ehco - pending  PHYSICAL EXAM  Temp:  [97.9 F (36.6 C)-99.4 F (37.4 C)] 99.2 F (37.3 C) (07/20 1031) Pulse Rate:  [73-149] 87 (07/20 1500) Resp:  [15-31] 19 (07/20 1500) BP: (74-180)/(29-64) 117/31 mmHg (07/20 1500) SpO2:  [91 %-100 %] 100 % (07/20 1425) Arterial Line BP: (49-94)/(38-49) 50/41 mmHg (07/20 0400) Weight:  [153 lb (69.4 kg)] 153 lb (69.4 kg) (07/19 1804)  General - Well nourished, well developed, in no apparent distress.  Ophthalmologic - Sharp disc margins OU.  Cardiovascular - Regular rate and rhythm with no murmur.  Mental Status -  Level of arousal and orientation to time, place, and person were intact. Language including expression, naming, repetition, comprehension was assessed and found intact. Fund of Knowledge was assessed and was intact.  Cranial Nerves II - XII - II - Visual field intact OU. III, IV, VI - Extraocular movements intact. V - Facial sensation intact bilaterally. VII - Facial movement showed left facial droop. VIII - Hearing & vestibular intact bilaterally. X - Palate elevates symmetrically, dysarthria. XI - Chin turning & shoulder shrug intact bilaterally. XII - Tongue protrusion intact.  Motor Strength - The patient's strength was 3+/5 LLE and 1/5 LUE, 5/5 RUE and RLE and pronator drift was present on the left.  Bulk was normal and fasciculations were absent.   Motor Tone - Muscle tone was assessed at the neck and appendages and was normal.  Reflexes - The patient's reflexes were 1+ in all extremities and she had no pathological reflexes.  Sensory - Light touch, temperature/pinprick were assessed and were symmetrical.    Coordination - The patient had normal movements in the right hand with no ataxia or dysmetria.  Tremor was absent.  Gait and Station - deferred due to safety  concerns.   ASSESSMENT/PLAN [name] is a 74 y.o. female with history of carotid stenosis, CAD, HTN presenting for right carotid endarterectomy with subsequent development of left sided weakness post procedure.  She did not receive IV t-PA due to surgery.   Stroke:  Right MCA territory cortical infarct s/p R CEA, likely thromboembolic post op  Resultant  Left arm > leg hemiparesis, dysarthria, L facial weakness  CT right parietal lobe near the vertex  MRI  Right MCA territory infarct   Cerebral angio post CEA shows widely patent right CEA site  Carotid Ultrasound right internal carotid artery patent.   2D Echo  pending   LDL 40  HgbA1c pending  Heparin 5000 units sq tid for VTE prophylaxis Diet clear liquid Room service appropriate?: Yes; Fluid consistency:: Thin  aspirin 162 mg orally daily prior to admission, now on aspirin 162 daily (has been on Plavix in the past for 1 year post cardiac stent placement)  Ongoing aggressive stroke risk factor management  Therapy  recommendations:  pending   Disposition:  pending   Hx hypertension  Variable BP  BP 74-32/180-62 past 24h (08/09/2014 @ 9:55 AM). Currently 104/37   On dopamine currently with goal SBP 100 per vascular  Hyperlipidemia  Home meds:  lipitor 80 mg daily, resumed in hospital. Also on omega 3  LDL 40, goal < 70  Continue statin at discharge  Tobacco abuse  Current smoker  Smoking cessation counseling provided  Pt is willing to quit  Other Stroke Risk Factors  Advanced age  Hx stroke/TIA - early 26s w/ R sided weakness that resolved   Coronary artery disease S/P PTCA, stent 12/2012  Other Active Problems  COPD  Hospital day # [place] for Pager information 08/09/2014 9:55 AM   I, the attending vascular neurologist, have personally obtained a history, examined the patient, evaluated laboratory data, individually viewed imaging  studies and agree with radiology interpretations. I obtained additional history from pt's husband at bedside. Together with the NP/PA, we formulated the assessment and plan of care which reflects our mutual decision.  I have made any additions or clarifications directly to the above note and agree with the findings and plan as currently documented.   74 yo F with hx of CAD, HTN, carotid stenosis s/p right CEA developed right MCA territory infarcts post op. Angio and CUS showed right ICA patent, likely the clot has moved distally and caused infarction. Currently left arm 1/5 and left leg 3+/5. Continue risk factor control. LDL 40, on statin. Pt should quit smoking. 2D and A1C pending. Continue ASA for stroke prevention.  [name] Hawking, MD PhD Stroke Neurology 08/09/2014 3:51 PM   To contact Stroke Continuity provider, please refer to [email or web address] After hours, contact General Neurology

## 2014-08-09 NOTE — Progress Notes (Signed)
PT Cancellation Note  Patient Details Name: [name] MRN: [id number] DOB: [birth year]   Cancelled Treatment:    Reason Eval/Treat Not Completed: Medical issues which prohibited therapy (Echo going in room).  Will return as able.  Thanks.   Irwin Brakeman F 08/09/2014, 1:37 PM [facility] [facility] [phone] (pager)

## 2014-08-09 NOTE — Progress Notes (Signed)
  Echocardiogram 2D Echocardiogram has been performed.  Diamond Nickel 08/09/2014, 12:39 PM

## 2014-08-09 NOTE — Plan of Care (Signed)
Problem: Acute Treatment Outcomes Goal: BP within ordered parameters Outcome: Not Met (add Reason) Patient is on dopamine gtt

## 2014-08-09 NOTE — Progress Notes (Addendum)
  Progress Note    08/09/2014 8:21 AM 1 Day Post-Op  Subjective:  C/o soreness around neck incision.  Unable to move left arm  Tm 99.4 now 99.2 HR 70's-80's NSR 09'B-353'G systolic 99% 2EQ6ST  Filed Vitals:   08/09/14 0700  BP: 115/33  Pulse: 84  Temp:   Resp: 20     Physical Exam: Neuro:  Flaccid left arm, otherwise, neuro is in tact.  Sensation is in tact left hand. 5/5 RUE and BLE. Tongue is midline.  Marginal mandibular palsy Incision:  C/d/i  CBC    Component Value Date/Time   WBC 9.8 08/09/2014 0400   RBC 3.55* 08/09/2014 0400   HGB 9.1* 08/09/2014 0400   HCT 28.5* 08/09/2014 0400   PLT 207 08/09/2014 0400   MCV 80.3 08/09/2014 0400   MCH 25.6* 08/09/2014 0400   MCHC 31.9 08/09/2014 0400   RDW 16.5* 08/09/2014 0400   LYMPHSABS 1.6 09/17/2013 0711   MONOABS 0.5 09/17/2013 0711   EOSABS 0.1 09/17/2013 0711   BASOSABS 0.0 09/17/2013 0711    BMET    Component Value Date/Time   NA 138 08/09/2014 0400   K 3.6 08/09/2014 0400   CL 104 08/09/2014 0400   CO2 25 08/09/2014 0400   GLUCOSE 128* 08/09/2014 0400   BUN 16 08/09/2014 0400   CREATININE 0.68 08/09/2014 0400   CALCIUM 8.2* 08/09/2014 0400   GFRNONAA >60 08/09/2014 0400   GFRAA >60 08/09/2014 0400     Intake/Output Summary (Last 24 hours) at 08/09/14 0821 Last data filed at 08/09/14 0700  Gross per 24 hour  Intake 1502.07 ml  Output   2025 ml  Net -522.93 ml      Assessment/Plan:  This is a 74 y.o. female who is s/p right CEA 1 Day Post-Op  -pt left arm continues to be flaccid.  She does have a marginal mandibular palsy -per neuro, CT of head shows possible right MCA infarct.   -cerebral angiogram reveals widely patent right carotid endarterectomy site with no gross filling defects or occlusions noted. -wean dopamine as tolerated (on 23mg/kg/min) -pt has not ambulated-out of bed to chair this morning -PT/OT evaluation and treatment -pt has not voided as foley is still in place.  D/c  after MRI -continue npo per neuro until swallow eval is completed. -Lovenox or SQ heparin not ordered.  Discussed with Dr. FManfred Archto start SQ heparin prophylaxis.   SLeontine Locket PA-C [facility] [phone]  Right brain periop stroke.  No movement left arm possibly some left leg weakness Neuro eval in progress Wean dopamine off OOB Most likely advance diet later today Will need to consider inpt vs outpt rehab  CRuta Hinds MD [facility] of GFerrumOffice: [phone]: [phone]

## 2014-08-10 ENCOUNTER — Telehealth: Payer: Self-pay | Admitting: Vascular Surgery

## 2014-08-10 ENCOUNTER — Encounter (HOSPITAL_COMMUNITY): Payer: Self-pay | Admitting: Vascular Surgery

## 2014-08-10 ENCOUNTER — Encounter (HOSPITAL_COMMUNITY): Payer: Self-pay | Admitting: Anesthesiology

## 2014-08-10 DIAGNOSIS — G8190 Hemiplegia, unspecified affecting unspecified side: Secondary | ICD-10-CM

## 2014-08-10 DIAGNOSIS — I63311 Cerebral infarction due to thrombosis of right middle cerebral artery: Secondary | ICD-10-CM

## 2014-08-10 LAB — HEMOGLOBIN A1C
HEMOGLOBIN A1C: 6.2 % — AB (ref 4.8–5.6)
MEAN PLASMA GLUCOSE: 131 mg/dL

## 2014-08-10 MED ORDER — CETYLPYRIDINIUM CHLORIDE 0.05 % MT LIQD
7.0000 mL | Freq: Two times a day (BID) | OROMUCOSAL | Status: DC
  Administered 2014-08-10 – 2014-08-11 (×2): 7 mL via OROMUCOSAL

## 2014-08-10 NOTE — Progress Notes (Signed)
STROKE TEAM PROGRESS NOTE   HISTORY [name] is an 74 y.o. female hx of carotid stenosis, CAD, HTN presenting for right carotid endarterectomy with subsequent development of left sided weakness post procedure. Patient with 2-3 week history of LUE numbness and weakness, question of rotator cuff impingement. No improvement with injection so presented today for right endarterectomy. Tolerated procedure well, awoke with no neurological deficits. Shortly after (1-2 hrs) noted to have acute onset of left arm weakness distal > proximal along with decreased sensation. Stat carotid doppler of R ICA was unremarkable. CT head was completed, imaging reviewed shows possible acute right MCA distribution infarct. She was taken to neuro-IR for diagnostic angiogram which showed "wide patency of right endarterectomy site and no gross filling defects or occlusion. She ws last known well 08/08/2014 at 1400. Modified Rankin: Rankin Score=0. Patient was not administered TPA secondary to recent surgery.    SUBJECTIVE (INTERVAL HISTORY) Her husband is at the bedside. No acute issue overnight. He states her arm is still weak. Husband reports PT has not seen her yet.    OBJECTIVE Temp:  [98.1 F (36.7 C)-99.2 F (37.3 C)] 98.8 F (37.1 C) (07/21 0700) Pulse Rate:  [74-98] 81 (07/21 0800) Cardiac Rhythm:  [-] Normal sinus rhythm (07/21 0800) Resp:  [15-24] 21 (07/21 0800) BP: (85-156)/(24-63) 124/40 mmHg (07/21 0800) SpO2:  [88 %-100 %] 92 % (07/21 0800)  No results for input(s): GLUCAP in the last 168 hours.  Recent Labs Lab 08/08/14 1207 08/08/14 1214 08/09/14 0400  NA 142 142 138  K 3.8 3.9 3.6  CL  --   --  104  CO2  --   --  25  GLUCOSE  --   --  128*  BUN  --   --  16  CREATININE  --   --  0.68  CALCIUM  --   --  8.2*   No results for input(s): AST, ALT, ALKPHOS, BILITOT, PROT, ALBUMIN in the last 168 hours.  Recent Labs Lab 08/08/14 1207 08/08/14 1214 08/09/14 0400  WBC  --   --   9.8  HGB 10.9* 10.9* 9.1*  HCT 32.0* 32.0* 28.5*  MCV  --   --  80.3  PLT  --   --  207   No results for input(s): CKTOTAL, CKMB, CKMBINDEX, TROPONINI in the last 168 hours. No results for input(s): LABPROT, INR in the last 72 hours. No results for input(s): COLORURINE, LABSPEC, [place], GLUCOSEU, HGBUR, BILIRUBINUR, KETONESUR, PROTEINUR, UROBILINOGEN, NITRITE, LEUKOCYTESUR in the last 72 hours.  Invalid input(s): APPERANCEUR     Component Value Date/Time   CHOL 95 08/09/2014 0400   TRIG 97 08/09/2014 0400   HDL 36* 08/09/2014 0400   CHOLHDL 2.6 08/09/2014 0400   VLDL 19 08/09/2014 0400   LDLCALC 40 08/09/2014 0400   Lab Results  Component Value Date   HGBA1C 6.2* 08/09/2014   No results found for: LABOPIA, COCAINSCRNUR, LABBENZ, AMPHETMU, THCU, LABBARB  No results for input(s): ETH in the last 168 hours.  Dg Chest 2 View 08/08/2014   Mild bibasilar atelectasis noted; lungs otherwise clear.     Dg Chest Port 1 View 08/08/2014    New diffuse interstitial and alveolar opacities, right greater than left, lot most likely asymmetric edema.   Ct Head Wo Contrast 08/08/2014   1. Possible acute nonhemorrhagic stroke in the posterior right middle cerebral artery distribution involving the right parietal lobe near the vertex. MRI with diffusion imaging may be confirmatory. 2.  No acute intracranial abnormality otherwise.   MRI 08/09/2014 Acute right MCA territory infarcts, predominantly involving the parietal and posterior frontal lobes.  CUS 08/08/14 The right internal carotid artery is patent. There is no flap or significant plaque morphology seen within the visualized portion of the right internal carotid artery. There is evidence of elevated distal right internal carotid artery velocities measuring 204/64 at its highest. The right vertebral artery is patent with antegrade flow.  Cerebral angio  1.Wide patency of Rt end endarterectomy site . 2. No gross filling defects or occlusions  noted  2D ehco  - Left ventricle: The cavity size was normal. There was mildconcentric hypertrophy. Systolic function was vigorous. Theestimated ejection fraction was in the range of 65% to 70%. Wallmotion was normal; there were no regional wall motionabnormalities. Doppler parameters are consistent with elevatedventricular end-diastolic filling pressure. - Aortic valve: Trileaflet; normal thickness leaflets. There was noregurgitation. - Aortic root: The aortic root was normal in size. - Left atrium: The atrium was mildly dilated. - Right ventricle: Systolic function was normal. - Right atrium: The atrium was normal in size. - Tricuspid valve: There was trivial regurgitation. - Pulmonic valve: There was no regurgitation. - Pulmonary arteries: Systolic pressure was within the normalrange. - Inferior vena cava: The vessel was normal in size. - Pericardium, extracardiac: There was no pericardial effusion.   PHYSICAL EXAM  Temp:  [98.1 F (36.7 C)-99.2 F (37.3 C)] 98.8 F (37.1 C) (07/21 0700) Pulse Rate:  [74-98] 81 (07/21 0800) Resp:  [15-24] 21 (07/21 0800) BP: (85-156)/(24-63) 124/40 mmHg (07/21 0800) SpO2:  [88 %-100 %] 92 % (07/21 0800)  General - Well nourished, well developed, in no apparent distress.  Ophthalmologic - Sharp disc margins OU.  Cardiovascular - Regular rate and rhythm with no murmur.  Mental Status -  Level of arousal and orientation to time, place, and person were intact. Language including expression, naming, repetition, comprehension was assessed and found intact. Fund of Knowledge was assessed and was intact.  Cranial Nerves II - XII - II - Visual field intact OU. III, IV, VI - Extraocular movements intact. V - Facial sensation intact bilaterally. VII - Facial movement showed left facial droop. VIII - Hearing & vestibular intact bilaterally. X - Palate elevates symmetrically, dysarthria. XI - Chin turning & shoulder shrug intact  bilaterally. XII - Tongue protrusion intact.  Motor Strength - The patient's strength was 3+/5 LLE and 1/5 LUE, 5/5 RUE and RLE and pronator drift was present on the left.  Bulk was normal and fasciculations were absent.   Motor Tone - Muscle tone was assessed at the neck and appendages and was normal.  Reflexes - The patient's reflexes were 1+ in all extremities and she had no pathological reflexes.  Sensory - Light touch, temperature/pinprick were assessed and were symmetrical.    Coordination - The patient had normal movements in the right hand with no ataxia or dysmetria.  Tremor was absent.  Gait and Station - deferred due to safety concerns.   ASSESSMENT/PLAN [name] is a 74 y.o. female with history of carotid stenosis, CAD, HTN presenting for right carotid endarterectomy with subsequent development of left sided weakness post procedure.  She did not receive IV t-PA due to surgery.   Stroke:  Right MCA territory cortical infarct s/p R CEA, likely thromboembolic post op  Resultant  Left arm > leg hemiparesis, dysarthria, L facial weakness  CT right parietal lobe near the vertex  MRI  Right MCA  territory infarct   Cerebral angio post CEA shows widely patent right CEA site  Carotid Ultrasound right internal carotid artery patent.   2D Echo  No source of embolus   LDL 40  HgbA1c 6.2  Heparin 5000 units sq tid for VTE prophylaxis Diet Heart Room service appropriate?: Yes; Fluid consistency:: Thin  aspirin 162 mg orally daily prior to admission, now on aspirin 162 daily (has been on Plavix in the past for 1 year post cardiac stent placement)  Ongoing aggressive stroke risk factor management  Therapy recommendations:  PT and OT evals pending. Will go ahead and consult CIR in anticipation  Disposition:  pending   Hx hypertension  Variable BP Remains on dopamine currently with BP 147/48 Orders to wean dopamine in place  Hyperlipidemia  Home meds:   lipitor 80 mg daily, resumed in hospital. Also on omega 3  LDL 40, goal < 70  Continue statin at discharge  Tobacco abuse  Current smoker  Smoking cessation counseling provided  Pt is willing to quit  Other Stroke Risk Factors  Advanced age  Hx stroke/TIA - early 40s w/ R sided weakness that resolved   Coronary artery disease S/P PTCA, stent 12/2012  Other Active Problems  COPD  NOTHING FURTHER TO ADD FROM THE STROKE STANDPOINT  Patient has a 10-15% risk of having another stroke over the next year, the highest risk is within 2 weeks of the most recent stroke/TIA (risk of having a stroke following a stroke or TIA is the same).  Ongoing risk factor control by Primary Care Physician  Stroke Service will sign off. Please call should any needs arise.  Follow-up Stroke Clinic at [facility] with Dr. Rosalin Hawking in 2 months, order placed.    Hospital day # Esmeralda for Pager information 08/10/2014 10:03 AM   I, the attending vascular neurologist, have personally obtained a history, examined the patient, evaluated laboratory data, individually viewed imaging studies and agree with radiology interpretations. I also discussed with husband and pt regarding her care plan. Together with the NP/PA, we formulated the assessment and plan of care which reflects our mutual decision.  I have made any additions or clarifications directly to the above note and agree with the findings and plan as currently documented.   74 yo F with hx of CAD, HTN, carotid stenosis s/p right CEA developed right MCA territory infarcts post op. Angio and CUS showed right ICA patent, likely the clot has moved distally and caused infarction. Currently left arm 1/5 and left leg 3+/5. 2D negative. Continue risk factor control. LDL 40, on statin. Pt is going to quit smoking. Continue ASA and statin for stroke prevention. Need aggressive PT/OT.   Neurology  will sign off. Please call with questions. Pt will follow up with Dr. Erlinda Hong at [facility] in about 2 months. Thanks for the consult.   Rosalin Hawking, MD PhD Stroke Neurology 08/10/2014 2:15 PM   To contact Stroke Continuity provider, please refer to [email or web address] After hours, contact General Neurology

## 2014-08-10 NOTE — Evaluation (Addendum)
Occupational Therapy Evaluation Patient Details Name: [name] MRN: [id number] DOB: 18-Oct-1940 Today's Date: 08/10/2014    History of Present Illness     This 74 y.o. female admitted for Rt carotid endarterectomy.  Post op, pt had acute onset Lt UE weakness.   MRI of brain revealed:  Acute right MCA territory infarcts, predominantly involving the parietal and posterior frontal lobes.          PMH includes CAD, HTN, ARF wtih trach and vent, ICH, anemia, COPD, PNA, Dizziness, CVA ('80s), SBO                Clinical Impression   Pt admitted with above. She demonstrates the below listed deficits and will benefit from continued OT to maximize safety and independence with BADLs.  Pt presents to OT with Lt hemiparesis, impaired balance, mild Lt inattention, an impaired cognition.  She requires mod - max A for ADLs and mod A +2 for functional mobility.  She  is motivated to improve.  Recommend CIR to allow her to maximize safety and independence with ADLs to allow her to return home at min A - min guard assist with spouse.       Follow Up Recommendations  CIR;Supervision/Assistance - 24 hour    Equipment Recommendations  None recommended by OT    Recommendations for Other Services       Precautions / Restrictions Precautions Precautions: Fall Restrictions Weight Bearing Restrictions: No      Mobility Bed Mobility Overal bed mobility: Needs Assistance Bed Mobility: Supine to Sit     Supine to sit: Mod assist;+2 for physical assistance     General bed mobility comments: assist to move LEs off EOB and assist to lift shoulders   Transfers Overall transfer level: Needs assistance Equipment used: Rolling walker (2 wheeled) Transfers: Sit to/from [facility] Sit to Stand: Mod assist;+2 physical assistance Stand pivot transfers: Mod assist;+2 physical assistance       General transfer comment: Pt requires +2 asisst for safety and balance.  pt initially  with Lt knee buckling, but this improved through session     Balance Overall balance assessment: Needs assistance Sitting-balance support: Feet supported Sitting balance-Leahy Scale: Poor Sitting balance - Comments: required min A for EOB sitting    Standing balance support: During functional activity;Single extremity supported Standing balance-Leahy Scale: Poor Standing balance comment: required mod A to maintain standing balance                             ADL Overall ADL's : Needs assistance/impaired Eating/Feeding: Minimal assistance;Sitting   Grooming: Wash/dry hands;Wash/dry face;Brushing hair;Minimal assistance;Sitting   Upper Body Bathing: Moderate assistance;Sitting   Lower Body Bathing: Moderate assistance;Sit to/from stand   Upper Body Dressing : Maximal assistance;Sitting   Lower Body Dressing: Total assistance;Sit to/from stand   Toilet Transfer: Minimal assistance;+2 for physical assistance;Stand-pivot;BSC;RW Toilet Transfer Details (indicate cue type and reason): verbal cues for techniques and safety.  Assist due to impaired balance and lt sided weakness  Toileting- Clothing Manipulation and Hygiene: Total assistance;Sit to/from stand       Functional mobility during ADLs: Rolling walker;+2 for physical assistance;Moderate assistance General ADL Comments: Pt requires asisst to access feet, for balance, problem solving, etc.  ? component of Lt inattention      Vision Vision Assessment?: Yes Eye Alignment: Within Functional Limits Tracking/Visual Pursuits: Other (comment) Visual Fields: No apparent deficits Additional Comments: Pt able to  track object smoothly on Rt, but frequently lost item due to closing eyes secondary to headache.  Visual assessment limited as it caused increased headache    Perception Perception Perception Tested?: Yes Perception Deficits: Inattention/neglect Inattention/Neglect: Does not attend to left side of body Spatial  deficits: Pt with mild Lt inattention    Praxis Praxis Praxis tested?: Deficits Deficits: Motor Impersistence    Pertinent Vitals/Pain Pain Assessment: Faces Faces Pain Scale: Hurts even more Pain Location: bladder Pain Descriptors / Indicators: Aching Pain Intervention(s): Limited activity within patient's tolerance;Monitored during session;Repositioned     Hand Dominance Right   Extremity/Trunk Assessment Upper Extremity Assessment Upper Extremity Assessment: Defer to OT evaluation RUE Deficits / Details: shoulder flexion limited to ~90*.  Unsure if poor effort or weakness  LUE Deficits / Details: Pt with movement Brunnstrom stage 2 - flexory synergies beginning  LUE Coordination: decreased fine motor;decreased gross motor   Lower Extremity Assessment Lower Extremity Assessment: RLE deficits/detail;LLE deficits/detail RLE Deficits / Details: grossly 3+/5 RLE Coordination: decreased gross motor LLE Deficits / Details: grossly 2+/5 LLE Sensation: decreased proprioception LLE Coordination: decreased gross motor;decreased fine motor   Cervical / Trunk Assessment Cervical / Trunk Assessment: Other exceptions Cervical / Trunk Exceptions: Pt with passive lateral flexion Lt    Communication Communication Communication: No difficulties   Cognition Arousal/Alertness: Awake/alert Behavior During Therapy: Flat affect (labile at times ) Overall Cognitive Status: Impaired/Different from baseline Area of Impairment: Attention;Safety/judgement;Awareness;Problem solving   Current Attention Level: Sustained     Safety/Judgement: Decreased awareness of safety;Decreased awareness of deficits Awareness: Intellectual Problem Solving: Slow processing;Decreased initiation;Difficulty sequencing;Requires verbal cues;Requires tactile cues General Comments: Pt requires mod cues for safety and processing    General Comments       Exercises Exercises: Other exercises Other Exercises Other  Exercises: Pt instructed in initial HEP Lt UE (slide Lt UE forward, and Lt digit extension )   Shoulder Instructions      Home Living Family/patient expects to be discharged to:: Private residence Living Arrangements: Spouse/significant other Available Help at Discharge: Family;Available 24 hours/day Type of Home: Mobile home Home Access: Stairs to enter Entrance Stairs-Number of Steps: Home with three platforms to enter  Entrance Stairs-Rails: None Home Layout: One level     Bathroom Shower/Tub: Occupational psychologist: Associate Professor Accessibility: Yes   Home Equipment: Wheelchair - manual;Walker - standard;Shower seat;Walker - 2 wheels;Cane - single point;Bedside commode;Walker - 4 wheels;Adaptive equipment Adaptive Equipment: Reacher;Sock aid Additional Comments: Spouse supportive   Lives With: Spouse    Prior Functioning/Environment Level of Independence: Independent        Comments: Pt and spouse report that she was very independent PTA including household management and driving     OT Diagnosis: Generalized weakness;Cognitive deficits;Disturbance of vision;Hemiplegia non-dominant side   OT Problem List: Decreased strength;Decreased range of motion;Decreased activity tolerance;Impaired balance (sitting and/or standing);Impaired vision/perception;Decreased coordination;Decreased cognition;Decreased safety awareness;Decreased knowledge of use of DME or AE;Decreased knowledge of precautions;Impaired UE functional use;Pain   OT Treatment/Interventions: Self-care/ADL training;Neuromuscular education;DME and/or AE instruction;Therapeutic activities;Cognitive remediation/compensation;Visual/perceptual remediation/compensation;Patient/family education;Balance training    OT Goals(Current goals can be found in the care plan section) Acute Rehab OT Goals Patient Stated Goal: to regain function of Lt. UE  OT Goal Formulation: With patient/family Time For Goal  Achievement: 08/24/14 Potential to Achieve Goals: Good ADL Goals Pt Will Perform Grooming: with min assist;standing Pt Will Perform Upper Body Bathing: with min assist;sitting Pt Will Perform Lower Body Bathing: with min assist;sit to/from stand  Pt Will Transfer to Toilet: with min assist;ambulating;regular height toilet;bedside commode;grab bars Pt Will Perform Toileting - Clothing Manipulation and hygiene: with caregiver independent in assisting;sit to/from stand Pt/caregiver will Perform Home Exercise Program: Increased ROM;Left upper extremity;Independently;With written HEP provided  OT Frequency: Min 3X/week   Barriers to D/C:            Co-evaluation PT/OT/SLP Co-Evaluation/Treatment: Yes Reason for Co-Treatment: For patient/therapist safety PT goals addressed during session: Mobility/safety with mobility OT goals addressed during session: ADL's and self-care      End of Session Equipment Utilized During Treatment: Rolling walker Nurse Communication: Mobility status  Activity Tolerance: Patient tolerated treatment well Patient left: in chair;with call bell/phone within reach;with family/visitor present   Time: 1115-5208 OT Time Calculation (min): 29 min Charges:  OT General Charges $OT Visit: 1 Procedure OT Evaluation $Initial OT Evaluation Tier I: 1 Procedure G-Codes:    Lucille Passy M 08-29-2014, 12:24 PM

## 2014-08-10 NOTE — Progress Notes (Signed)
Rehab admissions - Evaluated for possible admission.  I spoke with unit case manager.  Patient currently on dopamine drip.  I will open the case with [facility] and request acute inpatient rehab admission for tomorrow just in case patient is ready tomorrow.  I will follow up with patient in am.  Call me for questions.  #[phone]

## 2014-08-10 NOTE — Progress Notes (Signed)
   08/10/14 1830  Vitals  BP (!) 67/35 mmHg  MAP (mmHg) 42  BP Location Right Arm  BP Method Automatic  Patient Position (if appropriate) Lying  ECG Heart Rate 83  Resp 20  Dopamine decreased pt unable to tol

## 2014-08-10 NOTE — Progress Notes (Addendum)
  Progress Note    08/10/2014 7:50 AM 2 Days Post-Op  Subjective:  C/o headache  Afebrile 177'L-390'Z systolic HR 00'P-23'R NSR 93% RA  Filed Vitals:   08/10/14 0700  BP: 141/40  Pulse: 82  Temp: 98.8 F (37.1 C)  Resp: 21     Physical Exam: Neuro:  Left arm hemiparesis; tongue midline; marginal mandibular palsy; 5/5 BLE and RUE Incision:  C/d/i  CBC    Component Value Date/Time   WBC 9.8 08/09/2014 0400   RBC 3.55* 08/09/2014 0400   HGB 9.1* 08/09/2014 0400   HCT 28.5* 08/09/2014 0400   PLT 207 08/09/2014 0400   MCV 80.3 08/09/2014 0400   MCH 25.6* 08/09/2014 0400   MCHC 31.9 08/09/2014 0400   RDW 16.5* 08/09/2014 0400   LYMPHSABS 1.6 09/17/2013 0711   MONOABS 0.5 09/17/2013 0711   EOSABS 0.1 09/17/2013 0711   BASOSABS 0.0 09/17/2013 0711    BMET    Component Value Date/Time   NA 138 08/09/2014 0400   K 3.6 08/09/2014 0400   CL 104 08/09/2014 0400   CO2 25 08/09/2014 0400   GLUCOSE 128* 08/09/2014 0400   BUN 16 08/09/2014 0400   CREATININE 0.68 08/09/2014 0400   CALCIUM 8.2* 08/09/2014 0400   GFRNONAA >60 08/09/2014 0400   GFRAA >60 08/09/2014 0400     Intake/Output Summary (Last 24 hours) at 08/10/14 0750 Last data filed at 08/10/14 0600  Gross per 24 hour  Intake 508.45 ml  Output   1350 ml  Net -841.55 ml   Echo 08/09/14: Study Conclusions  - Left ventricle: The cavity size was normal. There was mild concentric hypertrophy. Systolic function was vigorous. The estimated ejection fraction was in the range of 65% to 70%. Wall motion was normal; there were no regional wall motion abnormalities. Doppler parameters are consistent with elevated ventricular end-diastolic filling pressure. - Aortic valve: Trileaflet; normal thickness leaflets. There was no regurgitation. - Aortic root: The aortic root was normal in size. - Left atrium: The atrium was mildly dilated. - Right ventricle: Systolic function was normal. - Right atrium:  The atrium was normal in size. - Tricuspid valve: There was trivial regurgitation. - Pulmonic valve: There was no regurgitation. - Pulmonary arteries: Systolic pressure was within the normal range. - Inferior vena cava: The vessel was normal in size. - Pericardium, extracardiac: There was no pericardial effusion.  MRI 08/09/14: IMPRESSION: Acute right MCA territory infarcts, predominantly involving the parietal and posterior frontal lobes.    Assessment/Plan:  This is a 74 y.o. female who is s/p right CEA 2 Days Post-Op  -pt with right brain stroke -pt still with hemiparesis of left arm this morning -complaining of headache this morning around her sinuses and back of her neck-improves with pain meds -still requiring dopamine-down to 86mg/kg/min-wean dopamine -no trouble swallowing and is on heart healthy diet -PT was unable to see pt yesterday due to getting echo-PT/OT today -continue mobilizing pt -await recs on disposition  SLeontine Locket PA-C [facility] [phone] Neck incision healing No significant change in left arm function PT today Wean dopamine off  Would benefit from Rehab will have them evaluate  CRuta Hinds MD [facility] [place] [phone]: [phone]

## 2014-08-10 NOTE — Evaluation (Signed)
Physical Therapy Evaluation Patient Details Name: KRYSTIANNA SOTH MRN: [id number] DOB: [birth year] Today's Date: 08/10/2014   History of Present Illness  This 74 y.o. female admitted for Rt carotid endarterectomy.  Post op, pt had acute onset Lt UE weakness.   MRI of brain revealed:  Acute right MCA territory infarcts, predominantly involving the parietal and posterior frontal lobes.             Clinical Impression  Pt admitted with above diagnosis. Pt currently with functional limitations due to the deficits listed below (see PT Problem List). Pt is a great candidate for Rehab.  Very motivated.   Pt will benefit from skilled PT to increase their independence and safety with mobility to allow discharge to the venue listed below.      Follow Up Recommendations CIR;Supervision/Assistance - 24 hour    Equipment Recommendations  Other (comment) (TBA)    Recommendations for Other Services Rehab consult     Precautions / Restrictions Precautions Precautions: Fall Restrictions Weight Bearing Restrictions: No      Mobility  Bed Mobility Overal bed mobility: Needs Assistance Bed Mobility: Supine to Sit     Supine to sit: Mod assist;+2 for physical assistance     General bed mobility comments: assist to move LEs off EOB and assist to lift shoulders.  total assist to clean pt when she used 3N1.    Transfers Overall transfer level: Needs assistance Equipment used: Rolling walker (2 wheeled) Transfers: Sit to/from [facility] Sit to Stand: Mod assist;+2 physical assistance Stand pivot transfers: Mod assist;+2 physical assistance       General transfer comment: Pt requires +2 asisst for safety and balance.  pt initially with Lt knee buckling, but this improved through session   Ambulation/Gait Ambulation/Gait assistance: Mod assist;+2 physical assistance Ambulation Distance (Feet): 45 Feet Assistive device: Rolling walker (2 wheeled) Gait Pattern/deviations:  Step-to pattern;Decreased step length - right;Decreased step length - left;Decreased weight shift to right;Decreased dorsiflexion - left;Shuffle;Ataxic;Staggering left;Drifts right/left;Staggering right;Narrow base of support   Gait velocity interpretation: Below normal speed for age/gender General Gait Details: Pt was able to grip RW with left grip weakening toward end of walk.  Pt needed cues and assist to sequence steps and RW.  Pt needed assist to weight shift bil at times with very narrow BOS and walking toward left of RW but could widen BOS with commands.  Pt with uncoordinated movement of LEs at times.  As pt fatigued, posture worsened and pt needed more assist.    Stairs            Wheelchair Mobility    Modified Rankin (Stroke Patients Only) Modified Rankin (Stroke Patients Only) Pre-Morbid Rankin Score: No symptoms Modified Rankin: Moderately severe disability     Balance Overall balance assessment: Needs assistance Sitting-balance support: No upper extremity supported;Feet supported Sitting balance-Leahy Scale: Poor Sitting balance - Comments: required min A for EOB sitting    Standing balance support: Bilateral upper extremity supported;During functional activity Standing balance-Leahy Scale: Poor Standing balance comment: required mod assist to maintain standing balance.                              Pertinent Vitals/Pain Pain Assessment: Faces Faces Pain Scale: Hurts even more Pain Location: bladder Pain Descriptors / Indicators: Aching Pain Intervention(s): Limited activity within patient's tolerance;Monitored during session;Repositioned  96% RA, 81 bpm, 149/37.      Home Living Family/patient expects to  be discharged to:: Private residence Living Arrangements: Spouse/significant other Available Help at Discharge: Family;Available 24 hours/day Type of Home: Mobile home Home Access: Stairs to enter Entrance Stairs-Rails: None Entrance  Stairs-Number of Steps: Home with three platforms to enter  Home Layout: One level Home Equipment: Wheelchair - manual;Walker - standard;Shower seat;Walker - 2 wheels;Cane - single point;Bedside commode;Walker - 4 wheels;Adaptive equipment Additional Comments: Spouse supportive     Prior Function Level of Independence: Independent         Comments: Pt and spouse report that she was very independent PTA including household management and driving      Hand Dominance   Dominant Hand: Right    Extremity/Trunk Assessment   Upper Extremity Assessment: Defer to OT evaluation RUE Deficits / Details: shoulder flexion limited to ~90*.  Unsure if poor effort or weakness      LUE Deficits / Details: Pt with movement Brunnstrom stage 2 - flexory synergies beginning    Lower Extremity Assessment: RLE deficits/detail;LLE deficits/detail RLE Deficits / Details: grossly 3+/5 LLE Deficits / Details: grossly 2+/5  Cervical / Trunk Assessment: Other exceptions  Communication   Communication: No difficulties  Cognition Arousal/Alertness: Awake/alert Behavior During Therapy: Flat affect (labile at times ) Overall Cognitive Status: Impaired/Different from baseline Area of Impairment: Attention;Safety/judgement;Awareness;Problem solving   Current Attention Level: Sustained     Safety/Judgement: Decreased awareness of safety;Decreased awareness of deficits Awareness: Intellectual Problem Solving: Slow processing;Decreased initiation;Difficulty sequencing;Requires verbal cues;Requires tactile cues General Comments: Pt requires mod cues for safety and processing     General Comments General comments (skin integrity, edema, etc.): spouse present and discussed frequency and goals of PT and OT with pt and spouse.     Exercises Other Exercises Other Exercises: Pt instructed in initial HEP bil LE AP and QS x10      Assessment/Plan    PT Assessment Patient needs continued PT services  PT  Diagnosis Generalized weakness   PT Problem List Decreased activity tolerance;Decreased balance;Decreased mobility;Decreased knowledge of use of DME;Decreased safety awareness;Decreased knowledge of precautions;Decreased strength;Decreased coordination;Decreased cognition  PT Treatment Interventions DME instruction;Gait training;Functional mobility training;Therapeutic activities;Therapeutic exercise;Balance training;Patient/family education   PT Goals (Current goals can be found in the Care Plan section) Acute Rehab PT Goals Patient Stated Goal: to regain function of Lt. UE  PT Goal Formulation: With patient/family Time For Goal Achievement: 08/24/14 Potential to Achieve Goals: Good    Frequency Min 4X/week   Barriers to discharge        Co-evaluation PT/OT/SLP Co-Evaluation/Treatment: Yes Reason for Co-Treatment: For patient/therapist safety PT goals addressed during session: Mobility/safety with mobility OT goals addressed during session: ADL's and self-care       End of Session Equipment Utilized During Treatment: Gait belt Activity Tolerance: Patient limited by fatigue Patient left: in chair;with call bell/phone within reach;with family/visitor present Nurse Communication: Mobility status         Time: 9604-5409 PT Time Calculation (min) (ACUTE ONLY): 29 min   Charges:   PT Evaluation $Initial PT Evaluation Tier I: 1 Procedure     PT G CodesDenice Paradise 2014-08-24, 1:55 PM [place] [facility] Acute Rehabilitation [phone] (pager)

## 2014-08-10 NOTE — Telephone Encounter (Signed)
-----   Message from Mena Goes, RN sent at 08/08/2014 11:25 AM EDT ----- Regarding: Schedule   ----- Message -----    From: Gabriel Earing, PA-C    Sent: 08/08/2014  10:34 AM      To: [facility] Charge Pool  S/p right CEA 08/08/14.  F/u with Dr. Oneida Alar in 2 weeks.  Thanks, Aldona Bar

## 2014-08-10 NOTE — Consult Note (Signed)
Physical Medicine and Rehabilitation Consult  Reason for Consult: Left sided weakness and slurred speech.  Referring Physician: Dr. Oneida Alar   HPI: [name] is a 74 y.o. female with history of CAD, HTN, CAS, 2-3 weeks history of intermittent LUE numbness and weakness who was admitted on 08/08/14 for R-CEA by Dr. Oneida Alar. Post procedure she developed left sided weakness and stat carotid duplex showed patent cervical ICA. CT head questioned acute R-MCA infarct and cerebral angiogram showed no occlusions or filling defects. MRI brain done revealing acute right parietal and posterior frontal lobe infarcts.  2D echo revealed EF 65-70% and no wall abnormality. Neurology felt that patient with thromboembolic stroke post op and to continue ASA 162 mg daily. Patient with resultant mild dysarthria, left sided weakness, higher level cognitive deficits. CIR recommended for follow up therapy.    Review of Systems  HENT: Negative for hearing loss and tinnitus.   Eyes: Negative for blurred vision and double vision.  Respiratory: Negative for cough and shortness of breath.   Cardiovascular: Negative for chest pain and palpitations.  Gastrointestinal: Negative for heartburn, nausea and abdominal pain.  Genitourinary: Positive for urgency (urge incontinence since last year) and frequency. Negative for dysuria.  Musculoskeletal: Positive for myalgias, back pain (chronic back pain radiating to Left knee/LE instability) and joint pain (OA right knee).       Left rotator cuff problems.  Skin: Negative for itching and rash.  Neurological: Positive for dizziness, sensory change (left side) and focal weakness. Negative for speech change and headaches.  Psychiatric/Behavioral: Negative for depression. The patient does not have insomnia.       Past Medical History  Diagnosis Date  . Carotid artery disease     a. 60-70% bilat ICA stenosis by dopplers 05/2012.  Marland Kitchen Other and unspecified hyperlipidemia      takes Lipitor daily  . CAD (coronary artery disease)     a. Mod dz 2010 initially mgd medically. b. 12/2012 - angina s/p PTCA/DES to mid-circumflex, PTCA/DES to first OM.   Marland Kitchen Barrett's esophagus   . SBO (small bowel obstruction)     a. Lysis of adhesions and ovarian cystectomy 04/2013 for sBO. b. Admitted 07/2013 for colonic obstruction due to suspected colitis, s/p partial colectomy/colostomy 08/01/13. Admission complicated by anasarca, acute resp failure requiring tracheostomy, decannulated 08/25/13. c. Recurrent SBO8/2015, NGT placed for decompression.   . Colonic obstruction due to suspected colitis; s/p colectomy/colostomy     a. See SBO.  Marland Kitchen Protein-calorie malnutrition, severe w/ electrolyte imbalance     a. Severe hypoalbuminemia leading to 3rd spacing including anasarca and pulm edema 07/2013.  Marland Kitchen DVT of axillary vein, acute right     a. Dx 07/2013 felt due to R IJ central line that had been inserted 7/14. Not on anticoag due to GIB issues and small cerebral bleed.  . Acute respiratory failure with hypoxia s/p tracheostomy 07/2013  . Intracranial hemorrhage     a. 08/11/13 - per DC summary, tiny SAH vs SDH done for altered mentation, anticoag stopped including aspirin.  . Anemia     a. 7-08/2013 felt due to recent critical illness/chronic disease.  . Complication of anesthesia     hard to wake up from anesthesia   . Hypertension     takes Metoprolol daily  . HTN (hypertension)   . Heart murmur   . COPD (chronic obstructive pulmonary disease)   . Pneumonia     hx of->15 yrs ago  . History  of bronchitis     > 28yr ago   . Dizziness     was on Bentyl which caused this-took off of it and no problems since  . Stroke early 80's    right sided weakness--TIA  . Joint pain   . Chronic back pain     deteriorating;DDD  . Neck pain     DDD  . History of hiatal hernia   . History of colon polyps   . History of kidney stones     was told it was stable but doesn't know for sure that she  ever passed it  . Depression     takes Remeron and Zoloft daily  . GERD (gastroesophageal reflux disease)   . Arthritis     handds, knees & back     Past Surgical History  Procedure Laterality Date  . Appendectomy    . Cholecystectomy    . Colon resection    . 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 Rotator cuff repair Left   . Abdominal hysterectomy    . Colon surgery    . Foot surgery Left   . Hand surgery Bilateral     for ganglion cysts  . Laparotomy N/A 08/01/2013    Procedure: EXPLORATORY LAPAROTOMY;  Surgeon: DHarl Bowie MD;  [place] [facility]  Service: General;  Laterality: N/A;  . Partial colectomy N/A 08/01/2013    Procedure: PARTIAL COLECTOMY;  Surgeon: DHarl Bowie MD;  [place] [facility]  Service: General;  Laterality: N/A;  . Colostomy N/A 08/01/2013    Procedure: COLOSTOMY;  Surgeon: DHarl Bowie MD;  [place] [facility]  Service: General;  Laterality: N/A;  . Bowel resection N/A 08/01/2013    Procedure: SMALL BOWEL RESECTION;  Surgeon: DHarl Bowie MD;  [place] [facility]  Service: General;  Laterality: N/A;  . Tracheostomy      feinstein  . Left heart catheterization with coronary angiogram N/A 12/20/2012    Procedure: LEFT HEART CATHETERIZATION WITH CORONARY ANGIOGRAM;  Surgeon: MBlane Ohara MD;  [facility];  Service: Cardiovascular;  Laterality: N/A;  . Cataract surgery Bilateral   . Colostomy takedown  01/24/2014    dr bNinfa Linden . Colostomy takedown N/A 01/24/2014    Procedure: COLOSTOMY TAKEDOWN;  Surgeon: DCoralie Keens MD;  [place] [facility]  Service: General;  Laterality: N/A;  . Tonsillectomy      Family History  Problem Relation Age of Onset  . Heart disease    . Varicose Veins Mother   . Heart disease Father   . Heart attack Father   . AAA (abdominal aortic aneurysm) Father     Social History:  Married. Independent PTA.  She reports that she has been smoking  Cigarettes--has cut down to 1/2 PPD.  She has a 26.5 pack-year smoking history. She has never used smokeless tobacco. She reports that she does not drink alcohol or use illicit drugs.    Allergies  Allergen Reactions  . Pregabalin Swelling    Tongue swelling  . Sulfonamide Derivatives Swelling    Childhood reaction    Medications Prior to Admission  Medication Sig Dispense Refill  . aspirin EC 81 MG tablet Take 162 mg by mouth daily.    .Marland Kitchenatorvastatin (LIPITOR) 80 MG tablet Take 80 mg by mouth every evening.     . docusate sodium (COLACE) 100 MG capsule  Take 100 mg by mouth 2 (two) times daily.    . fish oil-omega-3 fatty acids 1000 MG capsule Take 1,000 mg by mouth daily.     . folic acid (FOLVITE) 427 MCG tablet Take 800 mcg by mouth daily.     . metoprolol succinate (TOPROL-XL) 25 MG 24 hr tablet TAKE 1 TABLET BY MOUTH DAILY (Patient taking differently: TAKE 1 TABLET BY MOUTH DAILY  in the a.m.) 90 tablet 3  . mirtazapine (REMERON) 30 MG tablet Take 1 tablet by mouth at bedtime.     . nitroGLYCERIN (NITROSTAT) 0.4 MG SL tablet Place 1 tablet (0.4 mg total) under the tongue every 5 (five) minutes as needed for chest pain. 25 tablet 3  . ondansetron (ZOFRAN) 4 MG tablet Take 4 mg by mouth every 6 (six) hours as needed for nausea or vomiting.     . pantoprazole (PROTONIX) 40 MG tablet TAKE 1 TABLET TWICE DAILY 180 tablet 1  . sertraline (ZOLOFT) 100 MG tablet Take 100 mg by mouth 2 (two) times daily.      Home: Home Living Family/patient expects to be discharged to:: Private residence Living Arrangements: Spouse/significant other Available Help at Discharge: Family, Available 24 hours/day Type of Home: Mobile home Home Access: Stairs to enter [employer] of Steps: Home with three platforms to enter  Entrance Stairs-Rails: None Home Layout: One level Bathroom Shower/Tub: Multimedia programmer: Programmer, systems: Yes Home Equipment: Wheelchair -  manual, Environmental consultant - standard, Civil engineer, contracting, Environmental consultant - 2 wheels, [place] - single point, Bedside commode, Walker - 4 wheels, Adaptive equipment Adaptive Equipment: Reacher, Sock aid Additional Comments: Spouse supportive   Lives With: Spouse  Functional History: Prior Function Level of Independence: Independent Comments: Pt and spouse report that she was very independent PTA including household management and driving  Functional Status:  Mobility: Bed Mobility Overal bed mobility: Needs Assistance Bed Mobility: Supine to Sit Supine to sit: Mod assist, +2 for physical assistance General bed mobility comments: assist to move LEs off EOB and assist to lift shoulders  Transfers Overall transfer level: Needs assistance Equipment used: Rolling walker (2 wheeled) Transfers: Sit to/from Stand, [employer] Transfers Sit to Stand: Mod assist, +2 physical assistance Stand pivot transfers: Mod assist, +2 physical assistance General transfer comment: Pt requires +2 asisst for safety and balance.  pt initially with Lt knee buckling, but this improved through session       ADL: ADL Overall ADL's : Needs assistance/impaired Eating/Feeding: Minimal assistance, Sitting Grooming: Wash/dry hands, Wash/dry face, Brushing hair, Minimal assistance, Sitting Upper Body Bathing: Moderate assistance, Sitting Lower Body Bathing: Moderate assistance, Sit to/from stand Upper Body Dressing : Maximal assistance, Sitting Lower Body Dressing: Total assistance, Sit to/from stand Toilet Transfer: Minimal assistance, +2 for physical assistance, Stand-pivot, BSC, RW Toilet Transfer Details (indicate cue type and reason): verbal cues for techniques and safety.  Assist due to impaired balance and lt sided weakness  Toileting- Clothing Manipulation and Hygiene: Total assistance, Sit to/from stand Functional mobility during ADLs: Rolling walker, +2 for physical assistance, Moderate assistance General ADL Comments: Pt requires  asisst to access feet, for balance, problem solving, etc.  ? component of Lt inattention   Cognition: Cognition Overall Cognitive Status: Impaired/Different from baseline Arousal/Alertness: Awake/alert Orientation Level: Oriented X4 Attention: Focused, Sustained Focused Attention: Appears intact Sustained Attention: Impaired Sustained Attention Impairment: Verbal complex, Functional complex Memory: Impaired Memory Impairment: Decreased recall of new information, Retrieval deficit Awareness: Appears intact Problem Solving: Impaired Problem Solving Impairment:  Verbal complex, Functional complex Executive Function: Reasoning Reasoning: Impaired Reasoning Impairment: Verbal complex, Functional complex Safety/Judgment: Appears intact Cognition Arousal/Alertness: Awake/alert Behavior During Therapy: Flat affect (labile at times ) Overall Cognitive Status: Impaired/Different from baseline Area of Impairment: Attention, Safety/judgement, Awareness, Problem solving Current Attention Level: Sustained Safety/Judgement: Decreased awareness of safety, Decreased awareness of deficits Awareness: Intellectual Problem Solving: Slow processing, Decreased initiation, Difficulty sequencing, Requires verbal cues, Requires tactile cues General Comments: Pt requires mod cues for safety and processing   Blood pressure 124/40, pulse 81, temperature 98.8 F (37.1 C), temperature source Oral, resp. rate 21, height 5' (1.524 m), weight 69.4 kg (153 lb), SpO2 92 %. Physical Exam  Nursing note and vitals reviewed. Constitutional: She is oriented to person, place, and time. She appears well-developed and well-nourished.  HENT:  Head: Normocephalic and atraumatic.  Eyes: Conjunctivae are normal. Pupils are equal, round, and reactive to light.  Neck:  Decreased ROM. Right neck incision is clean, dry and intact.  Cardiovascular: Normal rate and regular rhythm.   Respiratory: Effort normal and breath sounds  normal. No respiratory distress. She has no wheezes.  GI: She exhibits no distension.  Musculoskeletal: She exhibits no edema or tenderness.  Left shoulder discomfort with attempts at ROM.   Neurological: She is alert and oriented to person, place, and time. She displays no atrophy and no tremor. She exhibits normal muscle tone. She displays no seizure activity.  Speech clear. Able to follow simple one and two step commands without difficulty. Left facial weakness. Left hemisensory deficits. LUE: 1+ deltoid, pecs, absent bicep,tricep, wrist, hand. LLE: 2/5hf, 3/5 ke and 3+ to 4 adf/apf. Left central 7 and tongue deviation    Skin: Skin is warm and dry.    No results found for this or any previous visit (from the past 24 hour(s)). Ct Head Wo Contrast  08/08/2014   CLINICAL DATA:  Postop right carotid endarterectomy, performed earlier today. When patient awoke from anesthesia, she was unable to move the left upper extremity.  EXAM: CT HEAD WITHOUT CONTRAST  TECHNIQUE: Contiguous axial images were obtained from the base of the skull through the vertex without intravenous contrast.  COMPARISON:  09/22/2013 and earlier.  FINDINGS: Ventricular system normal in size and appearance for age. Rule core vague low-attenuation involving the right parietal cortex approaching the vertex, not seen on prior examinations. No associated hemorrhage or hematoma. No extra-axial fluid collections. No associated mass effect or midline shift. Ventricular system normal in size and appearance for age.  No skull fracture or other focal osseous abnormality involving the skull. Mucous retention cyst or polyp in the left maxillary sinus. Remaining visualized paranasal sinuses, bilateral mastoid air cells and bilateral middle ear cavities well aerated. Severe bilateral carotid siphon and vertebrobasilar atherosclerosis.  IMPRESSION: 1. Possible acute nonhemorrhagic stroke in the posterior right middle cerebral artery distribution  involving the right parietal lobe near the vertex. MRI with diffusion imaging may be confirmatory. 2. No acute intracranial abnormality otherwise. Critical Value/emergent results were called by telephone at the time of interpretation on 08/08/2014 at 5:51 pm to the post anesthesia care unit, and I was told that the patient has been taken to the neurointerventional radiology suite for diagnosis and treatment.   Electronically Signed   By: Evangeline Dakin M.D.   On: 08/08/2014 17:56   Mr Brain Wo Contrast  08/09/2014   CLINICAL DATA:  Left arm weakness. Right carotid endarterectomy yesterday. Evidence of right MCA territory infarct on head CT yesterday following the  procedure. Subsequent cerebral angiogram without evidence of large vessel occlusion.  EXAM: MRI HEAD WITHOUT CONTRAST  TECHNIQUE: Multiplanar, multiecho pulse sequences of the brain and surrounding structures were obtained without intravenous contrast.  COMPARISON:  Head CT 08/08/2014 and MRI 12/29/2007  FINDINGS: There is a moderate-sized acute cortical infarct involving the right parietal and posterior right frontal lobes corresponding to the low density seen on yesterday's CT. There are also smaller areas of patchy acute cortical infarction more anteriorly in the right frontal lobe as well as more inferiorly in the right parietal and lateral right occipital lobes. There may be trace associated petechial hemorrhage in the right parietal lobe.  There is no evidence of mass, midline shift, or extra-axial fluid collection. Ventricles and sulci are within normal limits for age. Minimal chronic small vessel ischemic disease is noted in the periventricular white matter bilaterally and in the brainstem. There is a chronic lacunar infarct in the right putamen.  Mild mucosal thickening is noted in the maxillary sinuses and ethmoid air cells bilaterally. Mastoid air cells are clear. Major intracranial vascular flow voids are preserved.  IMPRESSION: Acute right  MCA territory infarcts, predominantly involving the parietal and posterior frontal lobes.   Electronically Signed   By: Logan Bores   On: 08/09/2014 14:12    Assessment/Plan: Diagnosis: Right MCA infarct after CEA with left hemiparesis and sensory loss 1. Does the need for close, 24 hr/day medical supervision in concert with the patient's rehab needs make it unreasonable for this patient to be served in a less intensive setting? Yes 2. Co-Morbidities requiring supervision/potential complications: depression, htn, cad 3. Due to bladder management, bowel management, safety, skin/wound care, disease management, medication administration, pain management and patient education, does the patient require 24 hr/day rehab nursing? Yes 4. Does the patient require coordinated care of a physician, rehab nurse, PT (1-2 hrs/day, 5 days/week), OT (1-2 hrs/day, 5 days/week) and SLP (1-2 hrs/day, 5 days/week) to address physical and functional deficits in the context of the above medical diagnosis(es)? Yes Addressing deficits in the following areas: balance, endurance, locomotion, strength, transferring, bowel/bladder control, bathing, dressing, feeding, grooming, toileting, cognition, speech, swallowing and psychosocial support 5. Can the patient actively participate in an intensive therapy program of at least 3 hrs of therapy per day at least 5 days per week? Yes 6. The potential for patient to make measurable gains while on inpatient rehab is excellent 7. Anticipated functional outcomes upon discharge from inpatient rehab are supervision and min assist  with PT, supervision and min assist with OT, modified independent and supervision with SLP. 8. Estimated rehab length of stay to reach the above functional goals is: 13-18 days 9. Does the patient have adequate social supports and living environment to accommodate these discharge functional goals? Yes 10. Anticipated D/C setting: Home 11. Anticipated post D/C  treatments: HH therapy and Outpatient therapy 12. Overall Rehab/Functional Prognosis: excellent  RECOMMENDATIONS: This patient's condition is appropriate for continued rehabilitative care in the following setting: CIR Patient has agreed to participate in recommended program. Yes Note that insurance prior authorization may be required for reimbursement for recommended care.  Comment: Rehab Admissions Coordinator to follow up.  Thanks,  Meredith Staggers, MD, Mellody Drown     08/10/2014

## 2014-08-10 NOTE — Telephone Encounter (Signed)
LM for pt re appt, dpm °

## 2014-08-11 ENCOUNTER — Inpatient Hospital Stay (HOSPITAL_COMMUNITY)
Admission: RE | Admit: 2014-08-11 | Discharge: 2014-08-19 | DRG: 056 | Disposition: A | Discharge status: Home / Self Care | Payer: Commercial Managed Care - HMO | Source: Intra-hospital | Attending: Physical Medicine & Rehabilitation | Admitting: Physical Medicine & Rehabilitation

## 2014-08-11 DIAGNOSIS — Z9841 Cataract extraction status, right eye: Secondary | ICD-10-CM

## 2014-08-11 DIAGNOSIS — M1990 Unspecified osteoarthritis, unspecified site: Secondary | ICD-10-CM | POA: Diagnosis present

## 2014-08-11 DIAGNOSIS — F17210 Nicotine dependence, cigarettes, uncomplicated: Secondary | ICD-10-CM | POA: Diagnosis present

## 2014-08-11 DIAGNOSIS — Z79899 Other long term (current) drug therapy: Secondary | ICD-10-CM

## 2014-08-11 DIAGNOSIS — I2510 Atherosclerotic heart disease of native coronary artery without angina pectoris: Secondary | ICD-10-CM | POA: Diagnosis present

## 2014-08-11 DIAGNOSIS — Z9842 Cataract extraction status, left eye: Secondary | ICD-10-CM | POA: Diagnosis not present

## 2014-08-11 DIAGNOSIS — D62 Acute posthemorrhagic anemia: Secondary | ICD-10-CM | POA: Diagnosis present

## 2014-08-11 DIAGNOSIS — F4320 Adjustment disorder, unspecified: Secondary | ICD-10-CM | POA: Diagnosis present

## 2014-08-11 DIAGNOSIS — I10 Essential (primary) hypertension: Secondary | ICD-10-CM | POA: Diagnosis present

## 2014-08-11 DIAGNOSIS — F419 Anxiety disorder, unspecified: Secondary | ICD-10-CM | POA: Diagnosis present

## 2014-08-11 DIAGNOSIS — E43 Unspecified severe protein-calorie malnutrition: Secondary | ICD-10-CM | POA: Diagnosis not present

## 2014-08-11 DIAGNOSIS — J449 Chronic obstructive pulmonary disease, unspecified: Secondary | ICD-10-CM | POA: Diagnosis present

## 2014-08-11 DIAGNOSIS — G8194 Hemiplegia, unspecified affecting left nondominant side: Secondary | ICD-10-CM

## 2014-08-11 DIAGNOSIS — K219 Gastro-esophageal reflux disease without esophagitis: Secondary | ICD-10-CM | POA: Diagnosis present

## 2014-08-11 DIAGNOSIS — I69054 Hemiplegia and hemiparesis following nontraumatic subarachnoid hemorrhage affecting left non-dominant side: Secondary | ICD-10-CM | POA: Diagnosis present

## 2014-08-11 DIAGNOSIS — R531 Weakness: Secondary | ICD-10-CM | POA: Diagnosis present

## 2014-08-11 DIAGNOSIS — F329 Major depressive disorder, single episode, unspecified: Secondary | ICD-10-CM | POA: Diagnosis present

## 2014-08-11 DIAGNOSIS — F32A Depression, unspecified: Secondary | ICD-10-CM | POA: Diagnosis present

## 2014-08-11 DIAGNOSIS — Z955 Presence of coronary angioplasty implant and graft: Secondary | ICD-10-CM

## 2014-08-11 DIAGNOSIS — I5033 Acute on chronic diastolic (congestive) heart failure: Secondary | ICD-10-CM | POA: Diagnosis present

## 2014-08-11 DIAGNOSIS — I5032 Chronic diastolic (congestive) heart failure: Secondary | ICD-10-CM | POA: Diagnosis not present

## 2014-08-11 DIAGNOSIS — I63511 Cerebral infarction due to unspecified occlusion or stenosis of right middle cerebral artery: Secondary | ICD-10-CM | POA: Diagnosis not present

## 2014-08-11 DIAGNOSIS — Z7982 Long term (current) use of aspirin: Secondary | ICD-10-CM | POA: Diagnosis not present

## 2014-08-11 DIAGNOSIS — D649 Anemia, unspecified: Secondary | ICD-10-CM | POA: Diagnosis present

## 2014-08-11 DIAGNOSIS — G8190 Hemiplegia, unspecified affecting unspecified side: Secondary | ICD-10-CM | POA: Diagnosis not present

## 2014-08-11 DIAGNOSIS — E785 Hyperlipidemia, unspecified: Secondary | ICD-10-CM | POA: Diagnosis present

## 2014-08-11 MED ORDER — GUAIFENESIN-DM 100-10 MG/5ML PO SYRP: 15.0000 mL | ORAL_SOLUTION | ORAL | Status: DC | PRN

## 2014-08-11 MED ORDER — DICLOFENAC SODIUM 1 % TD GEL
2.0000 g | Freq: Four times a day (QID) | TRANSDERMAL | Status: DC
  Administered 2014-08-12 – 2014-08-16 (×7): 2 g via TOPICAL
  Filled 2014-08-11: qty 100

## 2014-08-11 MED ORDER — GUAIFENESIN-DM 100-10 MG/5ML PO SYRP: 5.0000 mL | ORAL_SOLUTION | Freq: Four times a day (QID) | ORAL | Status: DC | PRN

## 2014-08-11 MED ORDER — ONDANSETRON HCL 4 MG/2ML IJ SOLN: 4.0000 mg | Freq: Four times a day (QID) | INTRAMUSCULAR | Status: DC | PRN

## 2014-08-11 MED ORDER — CETYLPYRIDINIUM CHLORIDE 0.05 % MT LIQD
7.0000 mL | Freq: Two times a day (BID) | OROMUCOSAL | Status: DC
  Administered 2014-08-12 – 2014-08-18 (×9): 7 mL via OROMUCOSAL

## 2014-08-11 MED ORDER — DOCUSATE SODIUM 100 MG PO CAPS
100.0000 mg | ORAL_CAPSULE | Freq: Two times a day (BID) | ORAL | Status: DC
  Administered 2014-08-11 – 2014-08-19 (×15): 100 mg via ORAL
  Filled 2014-08-11 (×17): qty 1

## 2014-08-11 MED ORDER — FOLIC ACID 1 MG PO TABS
1.0000 mg | ORAL_TABLET | Freq: Every day | ORAL | Status: DC
  Administered 2014-08-12 – 2014-08-19 (×8): 1 mg via ORAL
  Filled 2014-08-11 (×9): qty 1

## 2014-08-11 MED ORDER — TRAZODONE HCL 50 MG PO TABS
25.0000 mg | ORAL_TABLET | Freq: Every evening | ORAL | Status: DC | PRN
  Administered 2014-08-17 – 2014-08-18 (×2): 50 mg via ORAL
  Filled 2014-08-11 (×2): qty 1

## 2014-08-11 MED ORDER — ONDANSETRON HCL 4 MG PO TABS: 4.0000 mg | ORAL_TABLET | Freq: Four times a day (QID) | ORAL | Status: DC | PRN

## 2014-08-11 MED ORDER — POLYSACCHARIDE IRON COMPLEX 150 MG PO CAPS
150.0000 mg | ORAL_CAPSULE | Freq: Two times a day (BID) | ORAL | Status: DC
  Administered 2014-08-12 – 2014-08-18 (×14): 150 mg via ORAL
  Filled 2014-08-11 (×15): qty 1

## 2014-08-11 MED ORDER — BISACODYL 10 MG RE SUPP: 10.0000 mg | Freq: Every day | RECTAL | Status: DC | PRN

## 2014-08-11 MED ORDER — ALUM & MAG HYDROXIDE-SIMETH 200-200-20 MG/5ML PO SUSP: 30.0000 mL | ORAL | Status: DC | PRN

## 2014-08-11 MED ORDER — SERTRALINE HCL 100 MG PO TABS
100.0000 mg | ORAL_TABLET | Freq: Two times a day (BID) | ORAL | Status: DC
  Administered 2014-08-11 – 2014-08-19 (×16): 100 mg via ORAL
  Filled 2014-08-11 (×8): qty 1
  Filled 2014-08-11: qty 2
  Filled 2014-08-11 (×4): qty 1
  Filled 2014-08-11: qty 2
  Filled 2014-08-11: qty 1
  Filled 2014-08-11: qty 2
  Filled 2014-08-11 (×5): qty 1

## 2014-08-11 MED ORDER — ACETAMINOPHEN 325 MG PO TABS
325.0000 mg | ORAL_TABLET | ORAL | Status: DC | PRN
  Administered 2014-08-13 – 2014-08-14 (×2): 650 mg via ORAL
  Filled 2014-08-11 (×2): qty 2

## 2014-08-11 MED ORDER — OMEGA-3-ACID ETHYL ESTERS 1 G PO CAPS
1.0000 g | ORAL_CAPSULE | Freq: Every day | ORAL | Status: DC
  Administered 2014-08-12 – 2014-08-19 (×7): 1 g via ORAL
  Filled 2014-08-11 (×9): qty 1

## 2014-08-11 MED ORDER — NITROGLYCERIN 0.4 MG SL SUBL: 0.4000 mg | SUBLINGUAL_TABLET | SUBLINGUAL | Status: DC | PRN

## 2014-08-11 MED ORDER — ALUM & MAG HYDROXIDE-SIMETH 200-200-20 MG/5ML PO SUSP: 15.0000 mL | ORAL | Status: DC | PRN

## 2014-08-11 MED ORDER — FLEET ENEMA 7-19 GM/118ML RE ENEM: 1.0000 | ENEMA | Freq: Once | RECTAL | Status: AC | PRN

## 2014-08-11 MED ORDER — PANTOPRAZOLE SODIUM 40 MG PO TBEC
40.0000 mg | DELAYED_RELEASE_TABLET | Freq: Two times a day (BID) | ORAL | Status: DC
  Administered 2014-08-11 – 2014-08-19 (×16): 40 mg via ORAL
  Filled 2014-08-11 (×18): qty 1

## 2014-08-11 MED ORDER — ASPIRIN EC 81 MG PO TBEC
162.0000 mg | DELAYED_RELEASE_TABLET | Freq: Every day | ORAL | Status: DC
  Administered 2014-08-12 – 2014-08-19 (×8): 162 mg via ORAL
  Filled 2014-08-11 (×8): qty 2

## 2014-08-11 MED ORDER — ENOXAPARIN SODIUM 40 MG/0.4ML ~~LOC~~ SOLN
40.0000 mg | Freq: Every day | SUBCUTANEOUS | Status: DC
  Administered 2014-08-11 – 2014-08-18 (×8): 40 mg via SUBCUTANEOUS
  Filled 2014-08-11 (×8): qty 0.4

## 2014-08-11 MED ORDER — OXYCODONE HCL 5 MG PO TABS
5.0000 mg | ORAL_TABLET | ORAL | Status: DC | PRN
  Administered 2014-08-11 – 2014-08-19 (×13): 5 mg via ORAL
  Filled 2014-08-11 (×14): qty 1

## 2014-08-11 MED ORDER — MIRTAZAPINE 30 MG PO TABS
30.0000 mg | ORAL_TABLET | Freq: Every day | ORAL | Status: DC
  Administered 2014-08-11 – 2014-08-18 (×8): 30 mg via ORAL
  Filled 2014-08-11: qty 2
  Filled 2014-08-11 (×6): qty 1
  Filled 2014-08-11: qty 2
  Filled 2014-08-11 (×3): qty 1

## 2014-08-11 MED ORDER — ATORVASTATIN CALCIUM 80 MG PO TABS
80.0000 mg | ORAL_TABLET | Freq: Every evening | ORAL | Status: DC
  Administered 2014-08-12 – 2014-08-18 (×7): 80 mg via ORAL
  Filled 2014-08-11 (×7): qty 1

## 2014-08-11 MED ORDER — METOPROLOL SUCCINATE ER 25 MG PO TB24
25.0000 mg | ORAL_TABLET | Freq: Every day | ORAL | Status: DC
  Administered 2014-08-12 – 2014-08-19 (×8): 25 mg via ORAL
  Filled 2014-08-11 (×8): qty 1

## 2014-08-11 NOTE — Progress Notes (Signed)
Meredith Staggers, MD Physician Signed Physical Medicine and Rehabilitation Consult Note 08/10/2014 10:11 AM  Related encounter: Admission (Current) from 08/08/2014 in [facility]        Physical Medicine and Rehabilitation Consult  Reason for Consult: Left sided weakness and slurred speech.  Referring Physician: Dr. Oneida Alar   HPI: [name] is a 74 y.o. female with history of CAD, HTN, CAS, 2-3 weeks history of intermittent LUE numbness and weakness who was admitted on 08/08/14 for R-CEA by Dr. Oneida Alar. Post procedure she developed left sided weakness and stat carotid duplex showed patent cervical ICA. CT head questioned acute R-MCA infarct and cerebral angiogram showed no occlusions or filling defects. MRI brain done revealing acute right parietal and posterior frontal lobe infarcts. 2D echo revealed EF 65-70% and no wall abnormality. Neurology felt that patient with thromboembolic stroke post op and to continue ASA 162 mg daily. Patient with resultant mild dysarthria, left sided weakness, higher level cognitive deficits. CIR recommended for follow up therapy.    Review of Systems  HENT: Negative for hearing loss and tinnitus.  Eyes: Negative for blurred vision and double vision.  Respiratory: Negative for cough and shortness of breath.  Cardiovascular: Negative for chest pain and palpitations.  Gastrointestinal: Negative for heartburn, nausea and abdominal pain.  Genitourinary: Positive for urgency (urge incontinence since last year) and frequency. Negative for dysuria.  Musculoskeletal: Positive for myalgias, back pain (chronic back pain radiating to Left knee/LE instability) and joint pain (OA right knee).   Left rotator cuff problems.  Skin: Negative for itching and rash.  Neurological: Positive for dizziness, sensory change (left side) and focal weakness. Negative for speech change and headaches.    Psychiatric/Behavioral: Negative for depression. The patient does not have insomnia.      Past Medical History  Diagnosis Date  . Carotid artery disease     a. 60-70% bilat ICA stenosis by dopplers 05/2012.  Marland Kitchen Other and unspecified hyperlipidemia     takes Lipitor daily  . CAD (coronary artery disease)     a. Mod dz 2010 initially mgd medically. b. 12/2012 - angina s/p PTCA/DES to mid-circumflex, PTCA/DES to first OM.   Marland Kitchen Barrett's esophagus   . SBO (small bowel obstruction)     a. Lysis of adhesions and ovarian cystectomy 04/2013 for sBO. b. Admitted 07/2013 for colonic obstruction due to suspected colitis, s/p partial colectomy/colostomy 08/01/13. Admission complicated by anasarca, acute resp failure requiring tracheostomy, decannulated 08/25/13. c. Recurrent SBO8/2015, NGT placed for decompression.   . Colonic obstruction due to suspected colitis; s/p colectomy/colostomy     a. See SBO.  Marland Kitchen Protein-calorie malnutrition, severe w/ electrolyte imbalance     a. Severe hypoalbuminemia leading to 3rd spacing including anasarca and pulm edema 07/2013.  Marland Kitchen DVT of axillary vein, acute right     a. Dx 07/2013 felt due to R IJ central line that had been inserted 7/14. Not on anticoag due to GIB issues and small cerebral bleed.  . Acute respiratory failure with hypoxia s/p tracheostomy 07/2013  . Intracranial hemorrhage     a. 08/11/13 - per DC summary, tiny SAH vs SDH done for altered mentation, anticoag stopped including aspirin.  . Anemia     a. 7-08/2013 felt due to recent critical illness/chronic disease.  . Complication of anesthesia     hard to wake up from anesthesia   . Hypertension     takes Metoprolol daily  .  HTN (hypertension)   . Heart murmur   . COPD (chronic obstructive pulmonary disease)   . Pneumonia     hx of->15 yrs ago  . History of bronchitis     > 45yr ago   . Dizziness      was on Bentyl which caused this-took off of it and no problems since  . Stroke early 80's    right sided weakness--TIA  . Joint pain   . Chronic back pain     deteriorating;DDD  . Neck pain     DDD  . History of hiatal hernia   . History of colon polyps   . History of kidney stones     was told it was stable but doesn't know for sure that she ever passed it  . Depression     takes Remeron and Zoloft daily  . GERD (gastroesophageal reflux disease)   . Arthritis     handds, knees & back     Past Surgical History  Procedure Laterality Date  . Appendectomy    . Cholecystectomy    . Colon resection    . Coronary angioplasty with stent placement  12/20/2012    STENT TO OM DR COOPER  . Rotator cuff repair Left   . Abdominal hysterectomy    . Colon surgery    . Foot surgery Left   . Hand surgery Bilateral     for ganglion cysts  . Laparotomy N/A 08/01/2013    Procedure: EXPLORATORY LAPAROTOMY; Surgeon: DHarl Bowie MD; [facility] Service: General; Laterality: N/A;  . Partial colectomy N/A 08/01/2013    Procedure: PARTIAL COLECTOMY; Surgeon: DHarl Bowie MD; [facility] Service: General; Laterality: N/A;  . Colostomy N/A 08/01/2013    Procedure: COLOSTOMY; Surgeon: DHarl Bowie MD; [place] [facility] Service: General; Laterality: N/A;  . Bowel resection N/A 08/01/2013    Procedure: SMALL BOWEL RESECTION; Surgeon: DHarl Bowie MD; [place] [facility] Service: General; Laterality: N/A;  . Tracheostomy      feinstein  . Left heart catheterization with coronary angiogram N/A 12/20/2012    Procedure: LEFT HEART CATHETERIZATION WITH CORONARY ANGIOGRAM; Surgeon: MBlane Ohara MD; [facility]; Service: Cardiovascular; Laterality: N/A;  . Cataract surgery Bilateral   .  Colostomy takedown  01/24/2014    dr bNinfa Linden . Colostomy takedown N/A 01/24/2014    Procedure: COLOSTOMY TAKEDOWN; Surgeon: DCoralie Keens MD; [place] [facility] Service: General; Laterality: N/A;  . Tonsillectomy      Family History  Problem Relation Age of Onset  . Heart disease    . Varicose Veins Mother   . Heart disease Father   . Heart attack Father   . AAA (abdominal aortic aneurysm) Father     Social History: Married. Independent PTA. She reports that she has been smoking Cigarettes--has cut down to 1/2 PPD. She has a 26.5 pack-year smoking history. She has never used smokeless tobacco. She reports that she does not drink alcohol or use illicit drugs.    Allergies  Allergen Reactions  . Pregabalin Swelling    Tongue swelling  . Sulfonamide Derivatives Swelling    Childhood reaction    Medications Prior to Admission  Medication Sig Dispense Refill  . aspirin EC 81 MG tablet Take 162 mg by mouth daily.    .Marland Kitchenatorvastatin (LIPITOR) 80 MG tablet Take 80 mg by mouth every evening.     . docusate sodium (COLACE) 100 MG capsule Take 100 mg by mouth  2 (two) times daily.    . fish oil-omega-3 fatty acids 1000 MG capsule Take 1,000 mg by mouth daily.     . folic acid (FOLVITE) 854 MCG tablet Take 800 mcg by mouth daily.     . metoprolol succinate (TOPROL-XL) 25 MG 24 hr tablet TAKE 1 TABLET BY MOUTH DAILY (Patient taking differently: TAKE 1 TABLET BY MOUTH DAILY in the a.m.) 90 tablet 3  . mirtazapine (REMERON) 30 MG tablet Take 1 tablet by mouth at bedtime.     . nitroGLYCERIN (NITROSTAT) 0.4 MG SL tablet Place 1 tablet (0.4 mg total) under the tongue every 5 (five) minutes as needed for chest pain. 25 tablet 3  . ondansetron (ZOFRAN) 4 MG tablet Take 4 mg by mouth every 6 (six) hours as needed for nausea or vomiting.     . pantoprazole (PROTONIX) 40 MG tablet  TAKE 1 TABLET TWICE DAILY 180 tablet 1  . sertraline (ZOLOFT) 100 MG tablet Take 100 mg by mouth 2 (two) times daily.      Home: Home Living Family/patient expects to be discharged to:: Private residence Living Arrangements: Spouse/significant other Available Help at Discharge: Family, Available 24 hours/day Type of Home: Mobile home Home Access: Stairs to enter [employer] of Steps: Home with three platforms to enter  Entrance Stairs-Rails: None Home Layout: One level Bathroom Shower/Tub: Multimedia programmer: Programmer, systems: Yes Home Equipment: Wheelchair - manual, Environmental consultant - standard, Civil engineer, contracting, Environmental consultant - 2 wheels, [place] - single point, Bedside commode, Walker - 4 wheels, Adaptive equipment Adaptive Equipment: Reacher, Sock aid Additional Comments: Spouse supportive  Lives With: Spouse  Functional History: Prior Function Level of Independence: Independent Comments: Pt and spouse report that she was very independent PTA including household management and driving  Functional Status:  Mobility: Bed Mobility Overal bed mobility: Needs Assistance Bed Mobility: Supine to Sit Supine to sit: Mod assist, +2 for physical assistance General bed mobility comments: assist to move LEs off EOB and assist to lift shoulders  Transfers Overall transfer level: Needs assistance Equipment used: Rolling walker (2 wheeled) Transfers: Sit to/from Stand, [employer] Transfers Sit to Stand: Mod assist, +2 physical assistance Stand pivot transfers: Mod assist, +2 physical assistance General transfer comment: Pt requires +2 asisst for safety and balance. pt initially with Lt knee buckling, but this improved through session       ADL: ADL Overall ADL's : Needs assistance/impaired Eating/Feeding: Minimal assistance, Sitting Grooming: Wash/dry hands, Wash/dry face, Brushing hair, Minimal assistance, Sitting Upper Body Bathing: Moderate assistance,  Sitting Lower Body Bathing: Moderate assistance, Sit to/from stand Upper Body Dressing : Maximal assistance, Sitting Lower Body Dressing: Total assistance, Sit to/from stand Toilet Transfer: Minimal assistance, +2 for physical assistance, Stand-pivot, BSC, RW Toilet Transfer Details (indicate cue type and reason): verbal cues for techniques and safety. Assist due to impaired balance and lt sided weakness  Toileting- Clothing Manipulation and Hygiene: Total assistance, Sit to/from stand Functional mobility during ADLs: Rolling walker, +2 for physical assistance, Moderate assistance General ADL Comments: Pt requires asisst to access feet, for balance, problem solving, etc. ? component of Lt inattention   Cognition: Cognition Overall Cognitive Status: Impaired/Different from baseline Arousal/Alertness: Awake/alert Orientation Level: Oriented X4 Attention: Focused, Sustained Focused Attention: Appears intact Sustained Attention: Impaired Sustained Attention Impairment: Verbal complex, Functional complex Memory: Impaired Memory Impairment: Decreased recall of new information, Retrieval deficit Awareness: Appears intact Problem Solving: Impaired Problem Solving Impairment: Verbal complex, Functional complex Executive Function: Reasoning Reasoning: Impaired Reasoning  Impairment: Verbal complex, Functional complex Safety/Judgment: Appears intact Cognition Arousal/Alertness: Awake/alert Behavior During Therapy: Flat affect (labile at times ) Overall Cognitive Status: Impaired/Different from baseline Area of Impairment: Attention, Safety/judgement, Awareness, Problem solving Current Attention Level: Sustained Safety/Judgement: Decreased awareness of safety, Decreased awareness of deficits Awareness: Intellectual Problem Solving: Slow processing, Decreased initiation, Difficulty sequencing, Requires verbal cues, Requires tactile cues General Comments: Pt requires mod cues for safety and  processing   Blood pressure 124/40, pulse 81, temperature 98.8 F (37.1 C), temperature source Oral, resp. rate 21, height 5' (1.524 m), weight 69.4 kg (153 lb), SpO2 92 %. Physical Exam  Nursing note and vitals reviewed. Constitutional: She is oriented to person, place, and time. She appears well-developed and well-nourished.  HENT:  Head: Normocephalic and atraumatic.  Eyes: Conjunctivae are normal. Pupils are equal, round, and reactive to light.  Neck:  Decreased ROM. Right neck incision is clean, dry and intact.  Cardiovascular: Normal rate and regular rhythm.  Respiratory: Effort normal and breath sounds normal. No respiratory distress. She has no wheezes.  GI: She exhibits no distension.  Musculoskeletal: She exhibits no edema or tenderness.  Left shoulder discomfort with attempts at ROM.  Neurological: She is alert and oriented to person, place, and time. She displays no atrophy and no tremor. She exhibits normal muscle tone. She displays no seizure activity.  Speech clear. Able to follow simple one and two step commands without difficulty. Left facial weakness. Left hemisensory deficits. LUE: 1+ deltoid, pecs, absent bicep,tricep, wrist, hand. LLE: 2/5hf, 3/5 ke and 3+ to 4 adf/apf. Left central 7 and tongue deviation   Skin: Skin is warm and dry.     Lab Results Last 24 Hours    No results found for this or any previous visit (from the past 24 hour(s)).    Imaging Results (Last 48 hours)    Ct Head Wo Contrast  08/08/2014 CLINICAL DATA: Postop right carotid endarterectomy, performed earlier today. When patient awoke from anesthesia, she was unable to move the left upper extremity. EXAM: CT HEAD WITHOUT CONTRAST TECHNIQUE: Contiguous axial images were obtained from the base of the skull through the vertex without intravenous contrast. COMPARISON: 09/22/2013 and earlier. FINDINGS: Ventricular system normal in size and appearance for age. Rule core vague low-attenuation  involving the right parietal cortex approaching the vertex, not seen on prior examinations. No associated hemorrhage or hematoma. No extra-axial fluid collections. No associated mass effect or midline shift. Ventricular system normal in size and appearance for age. No skull fracture or other focal osseous abnormality involving the skull. Mucous retention cyst or polyp in the left maxillary sinus. Remaining visualized paranasal sinuses, bilateral mastoid air cells and bilateral middle ear cavities well aerated. Severe bilateral carotid siphon and vertebrobasilar atherosclerosis. IMPRESSION: 1. Possible acute nonhemorrhagic stroke in the posterior right middle cerebral artery distribution involving the right parietal lobe near the vertex. MRI with diffusion imaging may be confirmatory. 2. No acute intracranial abnormality otherwise. Critical Value/emergent results were called by telephone at the time of interpretation on 08/08/2014 at 5:51 pm to the post anesthesia care unit, and I was told that the patient has been taken to the neurointerventional radiology suite for diagnosis and treatment. Electronically Signed By: Evangeline Dakin M.D. On: 08/08/2014 17:56   Mr Brain Wo Contrast  08/09/2014 CLINICAL DATA: Left arm weakness. Right carotid endarterectomy yesterday. Evidence of right MCA territory infarct on head CT yesterday following the procedure. Subsequent cerebral angiogram without evidence of large vessel occlusion. EXAM: MRI HEAD  WITHOUT CONTRAST TECHNIQUE: Multiplanar, multiecho pulse sequences of the brain and surrounding structures were obtained without intravenous contrast. COMPARISON: Head CT 08/08/2014 and MRI 12/29/2007 FINDINGS: There is a moderate-sized acute cortical infarct involving the right parietal and posterior right frontal lobes corresponding to the low density seen on yesterday's CT. There are also smaller areas of patchy acute cortical infarction more anteriorly in the  right frontal lobe as well as more inferiorly in the right parietal and lateral right occipital lobes. There may be trace associated petechial hemorrhage in the right parietal lobe. There is no evidence of mass, midline shift, or extra-axial fluid collection. Ventricles and sulci are within normal limits for age. Minimal chronic small vessel ischemic disease is noted in the periventricular white matter bilaterally and in the brainstem. There is a chronic lacunar infarct in the right putamen. Mild mucosal thickening is noted in the maxillary sinuses and ethmoid air cells bilaterally. Mastoid air cells are clear. Major intracranial vascular flow voids are preserved. IMPRESSION: Acute right MCA territory infarcts, predominantly involving the parietal and posterior frontal lobes. Electronically Signed By: Logan Bores On: 08/09/2014 14:12     Assessment/Plan: Diagnosis: Right MCA infarct after CEA with left hemiparesis and sensory loss 1. Does the need for close, 24 hr/day medical supervision in concert with the patient's rehab needs make it unreasonable for this patient to be served in a less intensive setting? Yes 2. Co-Morbidities requiring supervision/potential complications: depression, htn, cad 3. Due to bladder management, bowel management, safety, skin/wound care, disease management, medication administration, pain management and patient education, does the patient require 24 hr/day rehab nursing? Yes 4. Does the patient require coordinated care of a physician, rehab nurse, PT (1-2 hrs/day, 5 days/week), OT (1-2 hrs/day, 5 days/week) and SLP (1-2 hrs/day, 5 days/week) to address physical and functional deficits in the context of the above medical diagnosis(es)? Yes Addressing deficits in the following areas: balance, endurance, locomotion, strength, transferring, bowel/bladder control, bathing, dressing, feeding, grooming, toileting, cognition, speech, swallowing and psychosocial  support 5. Can the patient actively participate in an intensive therapy program of at least 3 hrs of therapy per day at least 5 days per week? Yes 6. The potential for patient to make measurable gains while on inpatient rehab is excellent 7. Anticipated functional outcomes upon discharge from inpatient rehab are supervision and min assist with PT, supervision and min assist with OT, modified independent and supervision with SLP. 8. Estimated rehab length of stay to reach the above functional goals is: 13-18 days 9. Does the patient have adequate social supports and living environment to accommodate these discharge functional goals? Yes 10. Anticipated D/C setting: Home 11. Anticipated post D/C treatments: HH therapy and Outpatient therapy 12. Overall Rehab/Functional Prognosis: excellent  RECOMMENDATIONS: This patient's condition is appropriate for continued rehabilitative care in the following setting: CIR Patient has agreed to participate in recommended program. Yes Note that insurance prior authorization may be required for reimbursement for recommended care.  Comment: Rehab Admissions Coordinator to follow up.  Thanks,  Meredith Staggers, MD, Mellody Drown     08/10/2014       Revision History     Date/Time User Provider Type Action   08/10/2014 1:06 PM Meredith Staggers, MD Physician Sign   08/10/2014 12:56 PM Bary Leriche, PA-C Physician Assistant Share   View Details Report       Routing History     Date/Time From To Method   08/10/2014 1:06 PM Meredith Staggers, MD Meredith Staggers,  MD In Basket   08/10/2014 1:06 PM Meredith Staggers, MD Laverna Peace, NP Fax

## 2014-08-11 NOTE — Progress Notes (Signed)
Pt's husband educated to use call bell for pt needs, educated not to get pt OOB with nursing staff assistance

## 2014-08-11 NOTE — Progress Notes (Signed)
Speech Language Pathology Treatment: Cognitive-Linquistic  Patient Details Name: [name] MRN: [id number] DOB: 03-13-1940 Today's Date: 08/11/2014 Time: 0177-9390 SLP Time Calculation (min) (ACUTE ONLY): 17 min  Assessment / Plan / Recommendation Clinical Impression  Pt seen for f/u diagnostic therapy for cognition. SLP provided functional task of moderate complexity. Pt was responsible for her household finances PTA, but now requires min to mad assist for counting money. SLP provided verbal cues for organization, error recognition, sequencing and working memory. Pt responds well to cues and shows good potential for return to independence with more therapy. Strongly recommend CIR at d/c to meet this goal.    HPI Other Pertinent Information: [name] is an 74 y.o. female hx of carotid stenosis, CAD, HTN presenting for right carotid endarterectomy with subsequent development of left sided weakness post procedure. Patient with 2-3 week history of LUE numbness and weakness, question of rotator cuff impingement. No improvement with injection so presented today for right endarterectomy. Tolerated procedure well, awoke with no neurological deficits. Shortly after (1-2 hrs) noted to have acute onset of left arm weakness distal > proximal along with decreased sensation. Stat carotid doppler of R ICA was unremarkable. CT head was completed, imaging reviewed shows possible acute right MCA distribution infarct.   Pertinent Vitals Pain Assessment: Faces Faces Pain Scale: Hurts even more Pain Location: back Pain Intervention(s): Repositioned  SLP Plan  Continue with current plan of care    Recommendations                Follow up Recommendations: Inpatient Rehab Plan: Continue with current plan of care    GO    [facility], MA CCC-SLP [phone]  Lynann Beaver 08/11/2014, 9:57 AM

## 2014-08-11 NOTE — H&P (Signed)
Physical Medicine and Rehabilitation Admission H&P    CC: Left sided weakness and cognitive deficits   HPI:  [name] is a 74 y.o. female with history of CAD, HTN, CAS, 2-3 weeks history of intermittent LUE numbness and weakness who was admitted on 08/08/14 for R-CEA by Dr. Oneida Alar. Post procedure she developed left sided weakness and stat carotid duplex showed patent cervical ICA. CT head questioned acute R-MCA infarct and cerebral angiogram showed no occlusions or filling defects. MRI brain done revealing acute right parietal and posterior frontal lobe infarcts. 2D echo revealed EF 65-70% and no wall abnormality. Neurology felt that patient with thromboembolic stroke post op and to continue ASA 162 mg daily. Patient with resultant mild dysarthria, left sided weakness, higher level cognitive deficits. Has been weaned off dopamine and ABLA being monitored. CIR was  recommended for follow up therapy   ROS  HENT: Negative for hearing loss and tinnitus.   Eyes: Negative for blurred vision and double vision.  Respiratory: Negative for cough and shortness of breath.   Cardiovascular: Negative for chest pain and palpitations.  Gastrointestinal: Negative for heartburn, nausea and abdominal pain.  Genitourinary: Positive for urgency (urge incontinence since last year) and frequency. Negative for dysuria.  Musculoskeletal: Positive for myalgias, back pain (chronic back pain radiating to Left knee/LE instability) and joint pain (OA right knee).       Left rotator cuff problems.  Skin: Negative for itching and rash.  Neurological: Positive for dizziness, sensory change (left side) and focal weakness. Negative for speech change and headaches.  Psychiatric/Behavioral: Negative for depression. The patient does not have insomnia   Past Medical History  Diagnosis Date  . Carotid artery disease     a. 60-70% bilat ICA stenosis by dopplers 05/2012.  Marland Kitchen Other and unspecified hyperlipidemia     takes Lipitor daily  . CAD (coronary artery disease)     a. Mod dz 2010 initially mgd medically. b. 12/2012 - angina s/p PTCA/DES to mid-circumflex, PTCA/DES to first OM.   Marland Kitchen Barrett's esophagus   . SBO (small bowel obstruction)     a. Lysis of adhesions and ovarian cystectomy 04/2013 for sBO. b. Admitted 07/2013 for colonic obstruction due to suspected colitis, s/p partial colectomy/colostomy 08/01/13. Admission complicated by anasarca, acute resp failure requiring tracheostomy, decannulated 08/25/13. c. Recurrent SBO8/2015, NGT placed for decompression.   . Colonic obstruction due to suspected colitis; s/p colectomy/colostomy     a. See SBO.  Marland Kitchen Protein-calorie malnutrition, severe w/ electrolyte imbalance     a. Severe hypoalbuminemia leading to 3rd spacing including anasarca and pulm edema 07/2013.  Marland Kitchen DVT of axillary vein, acute right     a. Dx 07/2013 felt due to R IJ central line that had been inserted 7/14. Not on anticoag due to GIB issues and small cerebral bleed.  . Acute respiratory failure with hypoxia s/p tracheostomy 07/2013  . Intracranial hemorrhage     a. 08/11/13 - per DC summary, tiny SAH vs SDH done for altered mentation, anticoag stopped including aspirin.  . Anemia     a. 7-08/2013 felt due to recent critical illness/chronic disease.  . Complication of anesthesia     hard to wake up from anesthesia   . Hypertension     takes Metoprolol daily  . HTN (hypertension)   . Heart murmur   . COPD (chronic obstructive pulmonary disease)   . Pneumonia     hx of->15 yrs ago  . History of bronchitis     >  80yr ago   . Dizziness     was on Bentyl which caused this-took off of it and no problems since  . Stroke early 80's    right sided weakness--TIA  . Joint pain   . Chronic back pain     deteriorating;DDD  . Neck pain     DDD  . History of hiatal hernia   . History of colon polyps   . History of kidney stones     was told it was stable but doesn't know for sure that she ever  passed it  . Depression     takes Remeron and Zoloft daily  . GERD (gastroesophageal reflux disease)   . Arthritis     handds, knees & back     Past Surgical History  Procedure Laterality Date  . Appendectomy    . Cholecystectomy    . Colon resection    . 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 Rotator cuff repair Left   . Abdominal hysterectomy    . Colon surgery    . Foot surgery Left   . Hand surgery Bilateral     for ganglion cysts  . Laparotomy N/A 08/01/2013    Procedure: EXPLORATORY LAPAROTOMY;  Surgeon: DHarl Bowie MD;  [place] [facility]  Service: General;  Laterality: N/A;  . Partial colectomy N/A 08/01/2013    Procedure: PARTIAL COLECTOMY;  Surgeon: DHarl Bowie MD;  [place] [facility]  Service: General;  Laterality: N/A;  . Colostomy N/A 08/01/2013    Procedure: COLOSTOMY;  Surgeon: DHarl Bowie MD;  [place] [facility]  Service: General;  Laterality: N/A;  . Bowel resection N/A 08/01/2013    Procedure: SMALL BOWEL RESECTION;  Surgeon: DHarl Bowie MD;  [place] [facility]  Service: General;  Laterality: N/A;  . Tracheostomy      feinstein  . Left heart catheterization with coronary angiogram N/A 12/20/2012    Procedure: LEFT HEART CATHETERIZATION WITH CORONARY ANGIOGRAM;  Surgeon: MBlane Ohara MD;  [facility];  Service: Cardiovascular;  Laterality: N/A;  . Cataract surgery Bilateral   . Colostomy takedown  01/24/2014    dr bNinfa Linden . Colostomy takedown N/A 01/24/2014    Procedure: COLOSTOMY TAKEDOWN;  Surgeon: DCoralie Keens MD;  [place] [facility]  Service: General;  Laterality: N/A;  . Tonsillectomy    . Endarterectomy Right 08/08/2014    Procedure: RIGHT CAROTID ENDARTERECTOMY WITH HEMASHIELD PATCH ANGIOPLASTY;  Surgeon: CElam Dutch MD;  [facility];  Service: Vascular;  Laterality: Right;   Family History  Problem Relation Age of Onset  . Heart disease    . Varicose  Veins Mother   . Heart disease Father   . Heart attack Father   . AAA (abdominal aortic aneurysm) Father     Social History: Married---husband has mild dementia. Independent PTA. She reports that she has been smoking Cigarettes--has cut down to 1/2 PPD. She has a 26.5 pack-year smoking history. She has never used smokeless tobacco. She reports that she does not drink alcohol or use illicit drugs.    Allergies  Allergen Reactions  . Pregabalin Swelling    Tongue swelling  . Sulfonamide Derivatives Swelling    Childhood reaction    Medications Prior to Admission  Medication Sig Dispense Refill  . aspirin EC 81 MG tablet Take 162 mg by mouth daily.    .Marland Kitchen  atorvastatin (LIPITOR) 80 MG tablet Take 80 mg by mouth every evening.     . docusate sodium (COLACE) 100 MG capsule Take 100 mg by mouth 2 (two) times daily.    . fish oil-omega-3 fatty acids 1000 MG capsule Take 1,000 mg by mouth daily.     . folic acid (FOLVITE) 081 MCG tablet Take 800 mcg by mouth daily.     . metoprolol succinate (TOPROL-XL) 25 MG 24 hr tablet TAKE 1 TABLET BY MOUTH DAILY (Patient taking differently: TAKE 1 TABLET BY MOUTH DAILY  in the a.m.) 90 tablet 3  . mirtazapine (REMERON) 30 MG tablet Take 1 tablet by mouth at bedtime.     . nitroGLYCERIN (NITROSTAT) 0.4 MG SL tablet Place 1 tablet (0.4 mg total) under the tongue every 5 (five) minutes as needed for chest pain. 25 tablet 3  . ondansetron (ZOFRAN) 4 MG tablet Take 4 mg by mouth every 6 (six) hours as needed for nausea or vomiting.     . pantoprazole (PROTONIX) 40 MG tablet TAKE 1 TABLET TWICE DAILY 180 tablet 1  . sertraline (ZOLOFT) 100 MG tablet Take 100 mg by mouth 2 (two) times daily.      Home: Home Living Family/patient expects to be discharged to:: Private residence Living Arrangements: Spouse/significant other Available Help at Discharge: Family, Available 24 hours/day Type of Home: Mobile home Home Access: Stairs to enter [employer] of Steps: Home with three platforms to enter  Entrance Stairs-Rails: None Home Layout: One level Bathroom Shower/Tub: Multimedia programmer: Programmer, systems: Yes Home Equipment: Wheelchair - manual, Environmental consultant - standard, Civil engineer, contracting, Environmental consultant - 2 wheels, Cane - single point, Bedside commode, Walker - 4 wheels, Adaptive equipment Adaptive Equipment: Reacher, Sock aid Additional Comments: Spouse supportive   Lives With: Spouse   Functional History: Prior Function Level of Independence: Independent Comments: Pt and spouse report that she was very independent PTA including household management and driving   Functional Status:  Mobility: Bed Mobility Overal bed mobility: Needs Assistance Bed Mobility: Supine to Sit Supine to sit: Min assist, HOB elevated, Mod assist General bed mobility comments: assist to move LEs off EOB and assist to lift shoulders.   Transfers Overall transfer level: Needs assistance Equipment used: Rolling walker (2 wheeled) Transfers: Sit to/from Stand Sit to Stand: Mod assist, +2 physical assistance Stand pivot transfers: Mod assist, +2 physical assistance General transfer comment: Pt requires +2 asisst for safety and balance.  Pt left UE weak.  Pt required assist to place left UE onto RW and to remove left UE when sitting as pt does not have the strength nor coordination to do so.  Also suspect left neglect vs. inattention as well.   Ambulation/Gait Ambulation/Gait assistance: Mod assist, +2 physical assistance Ambulation Distance (Feet): 52 Feet (ambulated 52 feet x 2 with a 5 min seated rest break.) Assistive device: Rolling walker (2 wheeled) Gait Pattern/deviations: Step-to pattern, Decreased step length - right, Decreased step length - left, Decreased weight shift to right, Decreased dorsiflexion - left, Shuffle, Ataxic, Staggering left, Staggering right, Drifts right/left, Narrow base of support General Gait Details: Pt  was unable to grip RW with PT assisting pt with holding left UE on RW entire time.  Left grip weakening more as pt walked.  Pt needed cues and assist to sequence steps and RW.  Pt needed assist to weight shift bil at times with very narrow BOS and walking toward left of RW but could widen BOS with  commands.  Pt pushing RW with right UE which is why RW was not steering correctly therefore needed assist to steer RW.  Pt with uncoordinated movement of LEs at times.  As pt fatigued, posture worsened and pt needed more assist.  Followed pt with chair and had to cue her when to sit down as she has decr safety awareness of when to sit down.   Gait velocity interpretation: Below normal speed for age/gender    ADL: ADL Overall ADL's : Needs assistance/impaired Eating/Feeding: Minimal assistance, Sitting Grooming: Wash/dry hands, Wash/dry face, Brushing hair, Minimal assistance, Sitting Upper Body Bathing: Moderate assistance, Sitting Lower Body Bathing: Moderate assistance, Sit to/from stand Upper Body Dressing : Maximal assistance, Sitting Lower Body Dressing: Total assistance, Sit to/from stand Toilet Transfer: Minimal assistance, +2 for physical assistance, Stand-pivot, BSC, RW Toilet Transfer Details (indicate cue type and reason): verbal cues for techniques and safety.  Assist due to impaired balance and lt sided weakness  Toileting- Clothing Manipulation and Hygiene: Total assistance, Sit to/from stand Functional mobility during ADLs: Rolling walker, +2 for physical assistance, Moderate assistance General ADL Comments: Pt requires asisst to access feet, for balance, problem solving, etc.  ? component of Lt inattention   Cognition: Cognition Overall Cognitive Status: Impaired/Different from baseline Arousal/Alertness: Awake/alert Orientation Level: Oriented X4 Attention: Focused, Sustained Focused Attention: Appears intact Sustained Attention: Impaired Sustained Attention Impairment: Verbal  complex, Functional complex Memory: Impaired Memory Impairment: Decreased recall of new information, Retrieval deficit Awareness: Appears intact Problem Solving: Impaired Problem Solving Impairment: Verbal complex, Functional complex Executive Function: Reasoning Reasoning: Impaired Reasoning Impairment: Verbal complex, Functional complex Safety/Judgment: Appears intact Cognition Arousal/Alertness: Awake/alert Behavior During Therapy: Flat affect (labile at times ) Overall Cognitive Status: Impaired/Different from baseline Area of Impairment: Attention, Safety/judgement, Awareness, Problem solving Current Attention Level: Sustained Safety/Judgement: Decreased awareness of safety, Decreased awareness of deficits Awareness: Intellectual Problem Solving: Slow processing, Decreased initiation, Difficulty sequencing, Requires verbal cues, Requires tactile cues General Comments: Pt requires mod cues for safety and processing   : Blood pressure 115/36, pulse 79, temperature 98.9 F (37.2 C), temperature source Oral, resp. rate 21, height 5' (1.524 m), weight 69.4 kg (153 lb), SpO2 96 %. Physical Exam  Nursing note and vitals reviewed. Constitutional: She is oriented to person, place, and time. She appears well-developed and well-nourished.  HENT:  Head: Normocephalic and atraumatic.  Right Ear: External ear normal.  Left Ear: External ear normal.  Eyes: Conjunctivae are normal. Pupils are equal, round, and reactive to light. Right eye exhibits no discharge. Left eye exhibits no discharge.  Neck: Normal range of motion. Neck supple. No tracheal deviation present. No thyromegaly present.  Cardiovascular: Normal rate and regular rhythm.  Exam reveals no friction rub.   No murmur heard. Respiratory: Effort normal and breath sounds normal. No respiratory distress. She has no wheezes. She has no rales.  GI: Soft. Bowel sounds are normal. She exhibits no distension. There is no tenderness.  There is no rebound.  Musculoskeletal: She exhibits tenderness (left shoulder with attempts at ROM). She exhibits no edema.  Neurological: She is alert and oriented to person, place, and time.  Speech clear. Able to follow simple one and two step commands without difficulty.  . Left hemisensory deficits. LUE: 1+ deltoid, pecs; absent bicep,tricep, wrist, hand. LLE: 2/5hf, 3/5 ke and 3+ to 4 adf/apf. Left central 7 and tongue deviation. dysarthric    Skin: Skin is warm and dry. No erythema.  Psychiatric: Her mood appears anxious. Her speech  is slurred.    No results found for this or any previous visit (from the past 48 hour(s)). No results found.     Medical Problem List and Plan: 1. Functional deficits secondary to Right MCA infarct after CEA with left hemiparesis and sensory loss 2.  DVT Prophylaxis/Anticoagulation: Pharmaceutical: Lovenox 3. Chronic back/knee pain/Pain Management: Will continue oxydone prn for pain management. Add Voltaren gel to left shoulder to help with shoulder pain as well as K pad for back pain.   4. H/o Depression/Mood:  Continue Zoloft and Remeron. Team to provide ego support. LCSW to follow for evaluation and support.  5. Neuropsych: This patient is capable of making decisions on her own behalf. 6. Skin/Wound Care:  Routine pressure relief measures.  7. Fluids/Electrolytes/Nutrition: Monitor I/O. Check lytes in am.  8. Hypotension: Monitor BP every 8 hours. On Toprol for HR control.  9. ABLA: Post op bleeding. will monitor for signs of bleeding. Check CBC in am.  10. R-CEA:  On Lipitor,  Lovaza and ASA 11. Barrett's esophagitis: Continue  protonix bid with zofran prn for intermittent nausea.     Post Admission Physician Evaluation: 1. Functional deficits secondary  to  Right MCA infarct after CEA with left hemiparesis and sensory loss 2. Patient is admitted to receive collaborative, interdisciplinary care between the physiatrist, rehab nursing staff, and  therapy team. 3. Patient's level of medical complexity and substantial therapy needs in context of that medical necessity cannot be provided at a lesser intensity of care such as a SNF. 4. Patient has experienced substantial functional loss from his/her baseline which was documented above under the "Functional History" and "Functional Status" headings.  Judging by the patient's diagnosis, physical exam, and functional history, the patient has potential for functional progress which will result in measurable gains while on inpatient rehab.  These gains will be of substantial and practical use upon discharge  in facilitating mobility and self-care at the household level. 5. Physiatrist will provide 24 hour management of medical needs as well as oversight of the therapy plan/treatment and provide guidance as appropriate regarding the interaction of the two. 6. 24 hour rehab nursing will assist with bladder management, bowel management, safety, skin/wound care, disease management, medication administration, pain management and patient education  and help integrate therapy concepts, techniques,education, etc. 7. PT will assess and treat for/with: Lower extremity strength, range of motion, stamina, balance, functional mobility, safety, adaptive techniques and equipment, NMR, visual-spatial awareness, stroke education, family ed, ego support.   Goals are: supervision. 8. OT will assess and treat for/with: Lower extremity strength, range of motion, stamina, balance, functional mobility, safety, adaptive techniques and equipment, NMR, visual-spatial awareness, family education, stroke ecuation, community reintegration.   Goals are: supervision to min assist. Therapy may proceed with showering this patient. 9. SLP will assess and treat for/with: speech, communication.  Goals are: mod I to supervision. 10. Case Management and Social Worker will assess and treat for psychological issues and discharge planning. 11. Team  conference will be held weekly to assess progress toward goals and to determine barriers to discharge. 12. Patient will receive at least 3 hours of therapy per day at least 5 days per week. 13. ELOS: 15-20 days       14. Prognosis:  excellent     Meredith Staggers, MD, [facility] 08/11/2014

## 2014-08-11 NOTE — H&P (View-Only) (Signed)
Physical Medicine and Rehabilitation Admission H&P    CC: Left sided weakness and cognitive deficits   HPI:  [name] is a 74 y.o. female with history of CAD, HTN, CAS, 2-3 weeks history of intermittent LUE numbness and weakness who was admitted on 08/08/14 for R-CEA by Dr. Oneida Alar. Post procedure she developed left sided weakness and stat carotid duplex showed patent cervical ICA. CT head questioned acute R-MCA infarct and cerebral angiogram showed no occlusions or filling defects. MRI brain done revealing acute right parietal and posterior frontal lobe infarcts. 2D echo revealed EF 65-70% and no wall abnormality. Neurology felt that patient with thromboembolic stroke post op and to continue ASA 162 mg daily. Patient with resultant mild dysarthria, left sided weakness, higher level cognitive deficits. Has been weaned off dopamine and ABLA being monitored. CIR was  recommended for follow up therapy   ROS  HENT: Negative for hearing loss and tinnitus.   Eyes: Negative for blurred vision and double vision.  Respiratory: Negative for cough and shortness of breath.   Cardiovascular: Negative for chest pain and palpitations.  Gastrointestinal: Negative for heartburn, nausea and abdominal pain.  Genitourinary: Positive for urgency (urge incontinence since last year) and frequency. Negative for dysuria.  Musculoskeletal: Positive for myalgias, back pain (chronic back pain radiating to Left knee/LE instability) and joint pain (OA right knee).       Left rotator cuff problems.  Skin: Negative for itching and rash.  Neurological: Positive for dizziness, sensory change (left side) and focal weakness. Negative for speech change and headaches.  Psychiatric/Behavioral: Negative for depression. The patient does not have insomnia   Past Medical History  Diagnosis Date  . Carotid artery disease     a. 60-70% bilat ICA stenosis by dopplers 05/2012.  Marland Kitchen Other and unspecified hyperlipidemia     takes Lipitor daily  . CAD (coronary artery disease)     a. Mod dz 2010 initially mgd medically. b. 12/2012 - angina s/p PTCA/DES to mid-circumflex, PTCA/DES to first OM.   Marland Kitchen Barrett's esophagus   . SBO (small bowel obstruction)     a. Lysis of adhesions and ovarian cystectomy 04/2013 for sBO. b. Admitted 07/2013 for colonic obstruction due to suspected colitis, s/p partial colectomy/colostomy 08/01/13. Admission complicated by anasarca, acute resp failure requiring tracheostomy, decannulated 08/25/13. c. Recurrent SBO8/2015, NGT placed for decompression.   . Colonic obstruction due to suspected colitis; s/p colectomy/colostomy     a. See SBO.  Marland Kitchen Protein-calorie malnutrition, severe w/ electrolyte imbalance     a. Severe hypoalbuminemia leading to 3rd spacing including anasarca and pulm edema 07/2013.  Marland Kitchen DVT of axillary vein, acute right     a. Dx 07/2013 felt due to R IJ central line that had been inserted 7/14. Not on anticoag due to GIB issues and small cerebral bleed.  . Acute respiratory failure with hypoxia s/p tracheostomy 07/2013  . Intracranial hemorrhage     a. 08/11/13 - per DC summary, tiny SAH vs SDH done for altered mentation, anticoag stopped including aspirin.  . Anemia     a. 7-08/2013 felt due to recent critical illness/chronic disease.  . Complication of anesthesia     hard to wake up from anesthesia   . Hypertension     takes Metoprolol daily  . HTN (hypertension)   . Heart murmur   . COPD (chronic obstructive pulmonary disease)   . Pneumonia     hx of->15 yrs ago  . History of bronchitis     >  54yr ago   . Dizziness     was on Bentyl which caused this-took off of it and no problems since  . Stroke early 80's    right sided weakness--TIA  . Joint pain   . Chronic back pain     deteriorating;DDD  . Neck pain     DDD  . History of hiatal hernia   . History of colon polyps   . History of kidney stones     was told it was stable but doesn't know for sure that she ever  passed it  . Depression     takes Remeron and Zoloft daily  . GERD (gastroesophageal reflux disease)   . Arthritis     handds, knees & back     Past Surgical History  Procedure Laterality Date  . Appendectomy    . Cholecystectomy    . Colon resection    . 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 Rotator cuff repair Left   . Abdominal hysterectomy    . Colon surgery    . Foot surgery Left   . Hand surgery Bilateral     for ganglion cysts  . Laparotomy N/A 08/01/2013    Procedure: EXPLORATORY LAPAROTOMY;  Surgeon: DHarl Bowie MD;  [facility]  Service: General;  Laterality: N/A;  . Partial colectomy N/A 08/01/2013    Procedure: PARTIAL COLECTOMY;  Surgeon: DHarl Bowie MD;  [facility] [facility]  Service: General;  Laterality: N/A;  . Colostomy N/A 08/01/2013    Procedure: COLOSTOMY;  Surgeon: DHarl Bowie MD;  [facility] [facility]  Service: General;  Laterality: N/A;  . Bowel resection N/A 08/01/2013    Procedure: SMALL BOWEL RESECTION;  Surgeon: DHarl Bowie MD;  [place] [facility]  Service: General;  Laterality: N/A;  . Tracheostomy      feinstein  . Left heart catheterization with coronary angiogram N/A 12/20/2012    Procedure: LEFT HEART CATHETERIZATION WITH CORONARY ANGIOGRAM;  Surgeon: MBlane Ohara MD;  [facility];  Service: Cardiovascular;  Laterality: N/A;  . Cataract surgery Bilateral   . Colostomy takedown  01/24/2014    dr bNinfa Linden . Colostomy takedown N/A 01/24/2014    Procedure: COLOSTOMY TAKEDOWN;  Surgeon: DCoralie Keens MD;  [place] [facility]  Service: General;  Laterality: N/A;  . Tonsillectomy    . Endarterectomy Right 08/08/2014    Procedure: RIGHT CAROTID ENDARTERECTOMY WITH HEMASHIELD PATCH ANGIOPLASTY;  Surgeon: CElam Dutch MD;  [facility];  Service: Vascular;  Laterality: Right;   Family History  Problem Relation Age of Onset  . Heart disease    . Varicose  Veins Mother   . Heart disease Father   . Heart attack Father   . AAA (abdominal aortic aneurysm) Father     Social History: Married---husband has mild dementia. Independent PTA. She reports that she has been smoking Cigarettes--has cut down to 1/2 PPD. She has a 26.5 pack-year smoking history. She has never used smokeless tobacco. She reports that she does not drink alcohol or use illicit drugs.    Allergies  Allergen Reactions  . Pregabalin Swelling    Tongue swelling  . Sulfonamide Derivatives Swelling    Childhood reaction    Medications Prior to Admission  Medication Sig Dispense Refill  . aspirin EC 81 MG tablet Take 162 mg by mouth daily.    .Marland Kitchen  atorvastatin (LIPITOR) 80 MG tablet Take 80 mg by mouth every evening.     . docusate sodium (COLACE) 100 MG capsule Take 100 mg by mouth 2 (two) times daily.    . fish oil-omega-3 fatty acids 1000 MG capsule Take 1,000 mg by mouth daily.     . folic acid (FOLVITE) 341 MCG tablet Take 800 mcg by mouth daily.     . metoprolol succinate (TOPROL-XL) 25 MG 24 hr tablet TAKE 1 TABLET BY MOUTH DAILY (Patient taking differently: TAKE 1 TABLET BY MOUTH DAILY  in the a.m.) 90 tablet 3  . mirtazapine (REMERON) 30 MG tablet Take 1 tablet by mouth at bedtime.     . nitroGLYCERIN (NITROSTAT) 0.4 MG SL tablet Place 1 tablet (0.4 mg total) under the tongue every 5 (five) minutes as needed for chest pain. 25 tablet 3  . ondansetron (ZOFRAN) 4 MG tablet Take 4 mg by mouth every 6 (six) hours as needed for nausea or vomiting.     . pantoprazole (PROTONIX) 40 MG tablet TAKE 1 TABLET TWICE DAILY 180 tablet 1  . sertraline (ZOLOFT) 100 MG tablet Take 100 mg by mouth 2 (two) times daily.      Home: Home Living Family/patient expects to be discharged to:: Private residence Living Arrangements: Spouse/significant other Available Help at Discharge: Family, Available 24 hours/day Type of Home: Mobile home Home Access: Stairs to enter [employer] of Steps: Home with three platforms to enter  Entrance Stairs-Rails: None Home Layout: One level Bathroom Shower/Tub: Multimedia programmer: Programmer, systems: Yes Home Equipment: Wheelchair - manual, Environmental consultant - standard, Civil engineer, contracting, Environmental consultant - 2 wheels, Cane - single point, Bedside commode, Walker - 4 wheels, Adaptive equipment Adaptive Equipment: Reacher, Sock aid Additional Comments: Spouse supportive   Lives With: Spouse   Functional History: Prior Function Level of Independence: Independent Comments: Pt and spouse report that she was very independent PTA including household management and driving   Functional Status:  Mobility: Bed Mobility Overal bed mobility: Needs Assistance Bed Mobility: Supine to Sit Supine to sit: Min assist, HOB elevated, Mod assist General bed mobility comments: assist to move LEs off EOB and assist to lift shoulders.   Transfers Overall transfer level: Needs assistance Equipment used: Rolling walker (2 wheeled) Transfers: Sit to/from Stand Sit to Stand: Mod assist, +2 physical assistance Stand pivot transfers: Mod assist, +2 physical assistance General transfer comment: Pt requires +2 asisst for safety and balance.  Pt left UE weak.  Pt required assist to place left UE onto RW and to remove left UE when sitting as pt does not have the strength nor coordination to do so.  Also suspect left neglect vs. inattention as well.   Ambulation/Gait Ambulation/Gait assistance: Mod assist, +2 physical assistance Ambulation Distance (Feet): 52 Feet (ambulated 52 feet x 2 with a 5 min seated rest break.) Assistive device: Rolling walker (2 wheeled) Gait Pattern/deviations: Step-to pattern, Decreased step length - right, Decreased step length - left, Decreased weight shift to right, Decreased dorsiflexion - left, Shuffle, Ataxic, Staggering left, Staggering right, Drifts right/left, Narrow base of support General Gait Details: Pt  was unable to grip RW with PT assisting pt with holding left UE on RW entire time.  Left grip weakening more as pt walked.  Pt needed cues and assist to sequence steps and RW.  Pt needed assist to weight shift bil at times with very narrow BOS and walking toward left of RW but could widen BOS with  commands.  Pt pushing RW with right UE which is why RW was not steering correctly therefore needed assist to steer RW.  Pt with uncoordinated movement of LEs at times.  As pt fatigued, posture worsened and pt needed more assist.  Followed pt with chair and had to cue her when to sit down as she has decr safety awareness of when to sit down.   Gait velocity interpretation: Below normal speed for age/gender    ADL: ADL Overall ADL's : Needs assistance/impaired Eating/Feeding: Minimal assistance, Sitting Grooming: Wash/dry hands, Wash/dry face, Brushing hair, Minimal assistance, Sitting Upper Body Bathing: Moderate assistance, Sitting Lower Body Bathing: Moderate assistance, Sit to/from stand Upper Body Dressing : Maximal assistance, Sitting Lower Body Dressing: Total assistance, Sit to/from stand Toilet Transfer: Minimal assistance, +2 for physical assistance, Stand-pivot, BSC, RW Toilet Transfer Details (indicate cue type and reason): verbal cues for techniques and safety.  Assist due to impaired balance and lt sided weakness  Toileting- Clothing Manipulation and Hygiene: Total assistance, Sit to/from stand Functional mobility during ADLs: Rolling walker, +2 for physical assistance, Moderate assistance General ADL Comments: Pt requires asisst to access feet, for balance, problem solving, etc.  ? component of Lt inattention   Cognition: Cognition Overall Cognitive Status: Impaired/Different from baseline Arousal/Alertness: Awake/alert Orientation Level: Oriented X4 Attention: Focused, Sustained Focused Attention: Appears intact Sustained Attention: Impaired Sustained Attention Impairment: Verbal  complex, Functional complex Memory: Impaired Memory Impairment: Decreased recall of new information, Retrieval deficit Awareness: Appears intact Problem Solving: Impaired Problem Solving Impairment: Verbal complex, Functional complex Executive Function: Reasoning Reasoning: Impaired Reasoning Impairment: Verbal complex, Functional complex Safety/Judgment: Appears intact Cognition Arousal/Alertness: Awake/alert Behavior During Therapy: Flat affect (labile at times ) Overall Cognitive Status: Impaired/Different from baseline Area of Impairment: Attention, Safety/judgement, Awareness, Problem solving Current Attention Level: Sustained Safety/Judgement: Decreased awareness of safety, Decreased awareness of deficits Awareness: Intellectual Problem Solving: Slow processing, Decreased initiation, Difficulty sequencing, Requires verbal cues, Requires tactile cues General Comments: Pt requires mod cues for safety and processing   : Blood pressure 115/36, pulse 79, temperature 98.9 F (37.2 C), temperature source Oral, resp. rate 21, height 5' (1.524 m), weight 69.4 kg (153 lb), SpO2 96 %. Physical Exam  Nursing note and vitals reviewed. Constitutional: She is oriented to person, place, and time. She appears well-developed and well-nourished.  HENT:  Head: Normocephalic and atraumatic.  Right Ear: External ear normal.  Left Ear: External ear normal.  Eyes: Conjunctivae are normal. Pupils are equal, round, and reactive to light. Right eye exhibits no discharge. Left eye exhibits no discharge.  Neck: Normal range of motion. Neck supple. No tracheal deviation present. No thyromegaly present.  Cardiovascular: Normal rate and regular rhythm.  Exam reveals no friction rub.   No murmur heard. Respiratory: Effort normal and breath sounds normal. No respiratory distress. She has no wheezes. She has no rales.  GI: Soft. Bowel sounds are normal. She exhibits no distension. There is no tenderness.  There is no rebound.  Musculoskeletal: She exhibits tenderness (left shoulder with attempts at ROM). She exhibits no edema.  Neurological: She is alert and oriented to person, place, and time.  Speech clear. Able to follow simple one and two step commands without difficulty.  . Left hemisensory deficits. LUE: 1+ deltoid, pecs; absent bicep,tricep, wrist, hand. LLE: 2/5hf, 3/5 ke and 3+ to 4 adf/apf. Left central 7 and tongue deviation. dysarthric    Skin: Skin is warm and dry. No erythema.  Psychiatric: Her mood appears anxious. Her speech  is slurred.    No results found for this or any previous visit (from the past 48 hour(s)). No results found.     Medical Problem List and Plan: 1. Functional deficits secondary to Right MCA infarct after CEA with left hemiparesis and sensory loss 2.  DVT Prophylaxis/Anticoagulation: Pharmaceutical: Lovenox 3. Chronic back/knee pain/Pain Management: Will continue oxydone prn for pain management. Add Voltaren gel to left shoulder to help with shoulder pain as well as K pad for back pain.   4. H/o Depression/Mood:  Continue Zoloft and Remeron. Team to provide ego support. LCSW to follow for evaluation and support.  5. Neuropsych: This patient is capable of making decisions on her own behalf. 6. Skin/Wound Care:  Routine pressure relief measures.  7. Fluids/Electrolytes/Nutrition: Monitor I/O. Check lytes in am.  8. Hypotension: Monitor BP every 8 hours. On Toprol for HR control.  9. ABLA: Post op bleeding. will monitor for signs of bleeding. Check CBC in am.  10. R-CEA:  On Lipitor,  Lovaza and ASA 11. Barrett's esophagitis: Continue  protonix bid with zofran prn for intermittent nausea.     Post Admission Physician Evaluation: 1. Functional deficits secondary  to  Right MCA infarct after CEA with left hemiparesis and sensory loss 2. Patient is admitted to receive collaborative, interdisciplinary care between the physiatrist, rehab nursing staff, and  therapy team. 3. Patient's level of medical complexity and substantial therapy needs in context of that medical necessity cannot be provided at a lesser intensity of care such as a SNF. 4. Patient has experienced substantial functional loss from his/her baseline which was documented above under the "Functional History" and "Functional Status" headings.  Judging by the patient's diagnosis, physical exam, and functional history, the patient has potential for functional progress which will result in measurable gains while on inpatient rehab.  These gains will be of substantial and practical use upon discharge  in facilitating mobility and self-care at the household level. 5. Physiatrist will provide 24 hour management of medical needs as well as oversight of the therapy plan/treatment and provide guidance as appropriate regarding the interaction of the two. 6. 24 hour rehab nursing will assist with bladder management, bowel management, safety, skin/wound care, disease management, medication administration, pain management and patient education  and help integrate therapy concepts, techniques,education, etc. 7. PT will assess and treat for/with: Lower extremity strength, range of motion, stamina, balance, functional mobility, safety, adaptive techniques and equipment, NMR, visual-spatial awareness, stroke education, family ed, ego support.   Goals are: supervision. 8. OT will assess and treat for/with: Lower extremity strength, range of motion, stamina, balance, functional mobility, safety, adaptive techniques and equipment, NMR, visual-spatial awareness, family education, stroke ecuation, community reintegration.   Goals are: supervision to min assist. Therapy may proceed with showering this patient. 9. SLP will assess and treat for/with: speech, communication.  Goals are: mod I to supervision. 10. Case Management and Social Worker will assess and treat for psychological issues and discharge planning. 11. Team  conference will be held weekly to assess progress toward goals and to determine barriers to discharge. 12. Patient will receive at least 3 hours of therapy per day at least 5 days per week. 13. ELOS: 15-20 days       14. Prognosis:  excellent     Meredith Staggers, MD, [place] [facility] 08/11/2014

## 2014-08-11 NOTE — PMR Pre-admission (Signed)
PMR Admission Coordinator Pre-Admission Assessment  Patient: [name] is an 74 y.o., female MRN: [id number] DOB: 08/08/1940 Height: 5' (152.4 cm) Weight: 69.4 kg (153 lb)              Insurance Information HMO: Yes    PPO:       PCP:       IPA:       80/20:       OTHER:  Group # [id number] PRIMARY: [employer] ([employer])      Policy#: [id number]      Subscriber: Elpidio Anis CM Name: Amy      Phone#:       Fax#: [phone] Pre-Cert#: [id number] X 7 days       Employer: Retired Benefits:  Phone #: [phone]     Name: Automated Eff. Date: 01/21/12     Deduct: $0      Out of Pocket Max: $5500 (met $ 2020)      Life Max: unlimited CIR: $275 days 1-7 ($1925)      SNF: $0 days 1-20; $160 days 21-100 Outpatient: no limits     Co-Pay: $40 copay Home Health: 100%      Co-Pay: none DME: 80%     Co-Pay: 20% Providers: in network  Medicaid Application Date:        Case Manager:   Disability Application Date:        Case Worker:    Emergency Contact Information Contact Information    Name Relation Home Work Mobile   [place] Spouse [phone]  [place] Daughter [phone]  [phone]   Rakfeldt,[place] Daughter [phone]  [phone]     Current Medical History  Patient Admitting Diagnosis:  R MCA infarct post R CEA  History of Present Illness: A 74 y.o. female with history of CAD, HTN, CAS, 2-3 weeks history of intermittent LUE numbness and weakness who was admitted on 08/08/14 for R-CEA by Dr. Oneida Alar. Post procedure she developed left sided weakness and stat carotid duplex showed patent cervical ICA. CT head questioned acute R-MCA infarct and cerebral angiogram showed no occlusions or filling defects. MRI brain done revealing acute right parietal and posterior frontal lobe infarcts. 2D echo revealed EF 65-70% and no wall abnormality. Neurology felt that patient with thromboembolic stroke post op and to continue ASA 162 mg daily.  Patient with resultant mild dysarthria, left sided weakness, higher level cognitive deficits. Has been weaned off dopamine and ABLA being monitored. CIR was recommended for follow up therapy     Total: 10=NIH  Past Medical History  Past Medical History  Diagnosis Date  . Carotid artery disease     a. 60-70% bilat ICA stenosis by dopplers 05/2012.  Marland Kitchen Other and unspecified hyperlipidemia     takes Lipitor daily  . CAD (coronary artery disease)     a. Mod dz 2010 initially mgd medically. b. 12/2012 - angina s/p PTCA/DES to mid-circumflex, PTCA/DES to first OM.   Marland Kitchen Barrett's esophagus   . SBO (small bowel obstruction)     a. Lysis of adhesions and ovarian cystectomy 04/2013 for sBO. b. Admitted 07/2013 for colonic obstruction due to suspected colitis, s/p partial colectomy/colostomy 08/01/13. Admission complicated by anasarca, acute resp failure requiring tracheostomy, decannulated 08/25/13. c. Recurrent SBO8/2015, NGT placed for decompression.   . Colonic obstruction due to suspected colitis; s/p colectomy/colostomy     a. See SBO.  Marland Kitchen Protein-calorie malnutrition, severe w/ electrolyte imbalance     a. Severe hypoalbuminemia leading  to 3rd spacing including anasarca and pulm edema 07/2013.  Marland Kitchen DVT of axillary vein, acute right     a. Dx 07/2013 felt due to R IJ central line that had been inserted 7/14. Not on anticoag due to GIB issues and small cerebral bleed.  . Acute respiratory failure with hypoxia s/p tracheostomy 07/2013  . Intracranial hemorrhage     a. 08/11/13 - per DC summary, tiny SAH vs SDH done for altered mentation, anticoag stopped including aspirin.  . Anemia     a. 7-08/2013 felt due to recent critical illness/chronic disease.  . Complication of anesthesia     hard to wake up from anesthesia   . Hypertension     takes Metoprolol daily  . HTN (hypertension)   . Heart murmur   . COPD (chronic obstructive pulmonary disease)   . Pneumonia     hx of->15 yrs ago  . History of  bronchitis     > 81yr ago   . Dizziness     was on Bentyl which caused this-took off of it and no problems since  . Stroke early 80's    right sided weakness--TIA  . Joint pain   . Chronic back pain     deteriorating;DDD  . Neck pain     DDD  . History of hiatal hernia   . History of colon polyps   . History of kidney stones     was told it was stable but doesn't know for sure that she ever passed it  . Depression     takes Remeron and Zoloft daily  . GERD (gastroesophageal reflux disease)   . Arthritis     handds, knees & back     Family History  family history includes AAA (abdominal aortic aneurysm) in her father; Heart attack in her father; Heart disease in her father and another family member; Varicose Veins in her mother.  Prior Rehab/Hospitalizations: Went to a SNF ([facility] last year for therapies.  Has the patient had major surgery during 100 days prior to admission? No  Current Medications   Current facility-administered medications:  .   stroke: mapping our early stages of recovery book, , Does not apply, Once, PDrema Dallas DO .  0.9 %  sodium chloride infusion, , Intravenous, Continuous, Samantha J Rhyne, PA-C, Last Rate: 10 mL/hr at 08/09/14 2300, 10 mL at 08/09/14 2300 .  acetaminophen (TYLENOL) tablet 325-650 mg, 325-650 mg, Oral, Q4H PRN, 650 mg at 08/09/14 1821 **OR** acetaminophen (TYLENOL) suppository 325-650 mg, 325-650 mg, Rectal, Q4H PRN, Samantha J Rhyne, PA-C .  alum & mag hydroxide-simeth (MAALOX/MYLANTA) 200-200-20 MG/5ML suspension 15-30 mL, 15-30 mL, Oral, Q2H PRN, Samantha J Rhyne, PA-C .  antiseptic oral rinse (CPC / CETYLPYRIDINIUM CHLORIDE 0.05%) solution 7 mL, 7 mL, Mouth Rinse, BID, CElam Dutch MD, 7 mL at 08/11/14 1037 .  aspirin EC tablet 162 mg, 162 mg, Oral, Daily, Samantha J Rhyne, PA-C, 162 mg at 08/11/14 1036 .  atorvastatin (LIPITOR) tablet 80 mg, 80 mg, Oral, QPM, Samantha J Rhyne, PA-C, 80 mg at 08/10/14 2009 .  bisacodyl  (DULCOLAX) suppository 10 mg, 10 mg, Rectal, Daily PRN, Samantha J Rhyne, PA-C .  docusate sodium (COLACE) capsule 100 mg, 100 mg, Oral, BID, Samantha J Rhyne, PA-C, 100 mg at 08/11/14 1035 .  DOPamine (INTROPIN) 800 mg in dextrose 5 % 250 mL (3.2 mg/mL) infusion, 2-20 mcg/kg/min, Intravenous, Titrated, CElam Dutch MD, Stopped at 08/11/14 0206 .  folic acid (FOLVITE)  tablet 1 mg, 1 mg, Oral, Daily, Samantha J Rhyne, PA-C, 1 mg at 08/11/14 1036 .  guaiFENesin-dextromethorphan (ROBITUSSIN DM) 100-10 MG/5ML syrup 15 mL, 15 mL, Oral, Q4H PRN, Samantha J Rhyne, PA-C .  heparin injection 5,000 Units, 5,000 Units, Subcutaneous, 3 times per day, Gabriel Earing, PA-C, 5,000 Units at 08/11/14 0556 .  hydrALAZINE (APRESOLINE) injection 5 mg, 5 mg, Intravenous, Q20 Min PRN, Samantha J Rhyne, PA-C .  labetalol (NORMODYNE,TRANDATE) injection 10 mg, 10 mg, Intravenous, Q10 min PRN, Samantha J Rhyne, PA-C .  metoprolol (LOPRESSOR) injection 2-5 mg, 2-5 mg, Intravenous, Q2H PRN, Samantha J Rhyne, PA-C .  metoprolol succinate (TOPROL-XL) 24 hr tablet 25 mg, 25 mg, Oral, Daily, Samantha J Rhyne, PA-C, 25 mg at 08/11/14 1036 .  mirtazapine (REMERON) tablet 30 mg, 30 mg, Oral, QHS, Samantha J Rhyne, PA-C, 30 mg at 08/10/14 2114 .  morphine 2 MG/ML injection 2-3 mg, 2-3 mg, Intravenous, Q2H PRN, Hulen Shouts Rhyne, PA-C, 2 mg at 08/09/14 2207 .  nitroGLYCERIN (NITROSTAT) SL tablet 0.4 mg, 0.4 mg, Sublingual, Q5 min PRN, Samantha J Rhyne, PA-C .  omega-3 acid ethyl esters (LOVAZA) capsule 1 g, 1 g, Oral, Daily, Elam Dutch, MD, 1 g at 08/11/14 1035 .  ondansetron (ZOFRAN) tablet 4 mg, 4 mg, Oral, Q6H PRN **OR** ondansetron (ZOFRAN) injection 4 mg, 4 mg, Intravenous, Q6H PRN, Elam Dutch, MD, 4 mg at 08/10/14 0520 .  oxyCODONE (Oxy IR/ROXICODONE) immediate release tablet 5 mg, 5 mg, Oral, Q4H PRN, Samantha J Rhyne, PA-C, 5 mg at 08/11/14 1130 .  pantoprazole (PROTONIX) EC tablet 40 mg, 40 mg, Oral, BID,  Samantha J Rhyne, PA-C, 40 mg at 08/11/14 1036 .  phenol (CHLORASEPTIC) mouth spray 1 spray, 1 spray, Mouth/Throat, PRN, Samantha J Rhyne, PA-C .  potassium chloride SA (K-DUR,KLOR-CON) CR tablet 20-40 mEq, 20-40 mEq, Oral, Daily PRN, Samantha J Rhyne, PA-C .  sertraline (ZOLOFT) tablet 100 mg, 100 mg, Oral, BID, Samantha J Rhyne, PA-C, 100 mg at 08/11/14 1035  Patients Current Diet: Diet Heart Room service appropriate?: Yes; Fluid consistency:: Thin  Precautions / Restrictions Precautions Precautions: Fall Restrictions Weight Bearing Restrictions: No   Has the patient had 2 or more falls or a fall with injury in the past year?Yes.  She has had 3 falls with injury to her arms and legs.  Prior Activity Level Limited Community (1-2x/wk): Goes out 2-3 X a week.  Husband usually drives.  Home Assistive Devices / Equipment Home Assistive Devices/Equipment: Eyeglasses, Dentures (specify type) Home Equipment: Wheelchair - manual, Environmental consultant - standard, Shower seat, Environmental consultant - 2 wheels, Cane - single point, Bedside commode, Walker - 4 wheels, Adaptive equipment  Prior Device Use: Indicate devices/aids used by the patient prior to current illness, exacerbation or injury? None of the above.  Not using any devices PTA, but she has access to w/c, walker and bath chair per husband.  Prior Functional Level Prior Function Level of Independence: Independent Comments: Pt and spouse report that she was very independent PTA including household management and driving   Self Care: Did the patient need help bathing, dressing, using the toilet or eating?  Independent  Indoor Mobility: Did the patient need assistance with walking from room to room (with or without device)? Independent  Stairs: Did the patient need assistance with internal or external stairs (with or without device)? Independent  Functional Cognition: Did the patient need help planning regular tasks such as shopping or remembering to take  medications? Independent  Current Functional Level Cognition  Arousal/Alertness: Awake/alert Overall Cognitive Status: Impaired/Different from baseline Current Attention Level: Sustained Orientation Level: Oriented X4 Safety/Judgement: Decreased awareness of safety, Decreased awareness of deficits General Comments: Pt requires mod cues for safety and processing  Attention: Focused, Sustained Focused Attention: Appears intact Sustained Attention: Impaired Sustained Attention Impairment: Verbal complex, Functional complex Memory: Impaired Memory Impairment: Decreased recall of new information, Retrieval deficit Awareness: Appears intact Problem Solving: Impaired Problem Solving Impairment: Verbal complex, Functional complex Executive Function: Reasoning Reasoning: Impaired Reasoning Impairment: Verbal complex, Functional complex Safety/Judgment: Appears intact    Extremity Assessment (includes Sensation/Coordination)  Upper Extremity Assessment: Defer to OT evaluation RUE Deficits / Details: shoulder flexion limited to ~90*.  Unsure if poor effort or weakness  LUE Deficits / Details: Pt with movement Brunnstrom stage 2 - flexory synergies beginning  LUE Coordination: decreased fine motor, decreased gross motor  Lower Extremity Assessment: RLE deficits/detail, LLE deficits/detail RLE Deficits / Details: grossly 3+/5 RLE Coordination: decreased gross motor LLE Deficits / Details: grossly 2+/5 LLE Sensation: decreased proprioception LLE Coordination: decreased gross motor, decreased fine motor    ADLs  Overall ADL's : Needs assistance/impaired Eating/Feeding: Minimal assistance, Sitting Grooming: Wash/dry hands, Wash/dry face, Brushing hair, Minimal assistance, Sitting Upper Body Bathing: Moderate assistance, Sitting Lower Body Bathing: Moderate assistance, Sit to/from stand Upper Body Dressing : Maximal assistance, Sitting Lower Body Dressing: Total assistance, Sit to/from  stand Toilet Transfer: Minimal assistance, +2 for physical assistance, Stand-pivot, BSC, RW Toilet Transfer Details (indicate cue type and reason): verbal cues for techniques and safety.  Assist due to impaired balance and lt sided weakness  Toileting- Clothing Manipulation and Hygiene: Total assistance, Sit to/from stand Functional mobility during ADLs: Rolling walker, +2 for physical assistance, Moderate assistance General ADL Comments: Pt requires asisst to access feet, for balance, problem solving, etc.  ? component of Lt inattention     Mobility  Overal bed mobility: Needs Assistance Bed Mobility: Supine to Sit Supine to sit: Min assist, HOB elevated, Mod assist General bed mobility comments: assist to move LEs off EOB and assist to lift shoulders.      Transfers  Overall transfer level: Needs assistance Equipment used: Rolling walker (2 wheeled) Transfers: Sit to/from Stand Sit to Stand: Mod assist, +2 physical assistance Stand pivot transfers: Mod assist, +2 physical assistance General transfer comment: Pt requires +2 asisst for safety and balance.  Pt left UE weak.  Pt required assist to place left UE onto RW and to remove left UE when sitting as pt does not have the strength nor coordination to do so.  Also suspect left neglect vs. inattention as well.      Ambulation / Gait / Stairs / Wheelchair Mobility  Ambulation/Gait Ambulation/Gait assistance: Mod assist, +2 physical assistance Ambulation Distance (Feet): 52 Feet (ambulated 52 feet x 2 with a 5 min seated rest break.) Assistive device: Rolling walker (2 wheeled) Gait Pattern/deviations: Step-to pattern, Decreased step length - right, Decreased step length - left, Decreased weight shift to right, Decreased dorsiflexion - left, Shuffle, Ataxic, Staggering left, Staggering right, Drifts right/left, Narrow base of support General Gait Details: Pt was unable to grip RW with PT assisting pt with holding left UE on RW entire time.   Left grip weakening more as pt walked.  Pt needed cues and assist to sequence steps and RW.  Pt needed assist to weight shift bil at times with very narrow BOS and walking toward left of RW but could widen BOS with commands.  Pt pushing RW with right UE which is why RW was not steering correctly therefore needed assist to steer RW.  Pt with uncoordinated movement of LEs at times.  As pt fatigued, posture worsened and pt needed more assist.  Followed pt with chair and had to cue her when to sit down as she has decr safety awareness of when to sit down.   Gait velocity interpretation: Below normal speed for age/gender    Posture / Balance Dynamic Sitting Balance Sitting balance - Comments: required min A for EOB sitting  Balance Overall balance assessment: Needs assistance Sitting-balance support: No upper extremity supported, Feet supported Sitting balance-Leahy Scale: Poor Sitting balance - Comments: required min A for EOB sitting  Standing balance support: Bilateral upper extremity supported, During functional activity Standing balance-Leahy Scale: Poor Standing balance comment: required mod assist for standing balance as pt has poor postural stability overall due to left LE weaknesss and decr awareness.      Special needs/care consideration BiPAP/CPAP No CPM No Continuous Drip IV No Dialysis No      Life Vest No Oxygen No Special Bed No Trach Size No Wound Vac (area) No      Skin Has dressings to right groin and to right neck incision.                           Bowel mgmt: Last BM 08/07/14 Bladder mgmt: Voiding up on BSC Diabetic mgmt No    Previous Home Environment Living Arrangements: Spouse/significant other  Lives With: Spouse Available Help at Discharge: Family, Available 24 hours/day Type of Home: Mobile home Home Layout: One level Home Access: Stairs to enter Entrance Stairs-Rails: None Entrance Stairs-Number of Steps: Home with three platforms to enter  [employer]  Shower/Tub: Multimedia programmer: Associate Professor Accessibility: Yes Home Care Services: No Additional Comments: Spouse supportive   Discharge Living Setting Plans for Discharge Living Setting: Lives with (comment), Mobile Home (Lives with husband and daughter in double wide mobile home.) Type of Home at Discharge: Mobile home (Double wide.) Discharge Home Layout: One level Discharge Home Access: Stairs to enter Entrance Stairs-Number of Steps: 2 Does the patient have any problems obtaining your medications?: No  Social/Family/Support Systems Patient Roles: Spouse, Parent (Has a husband and 3 daughters.) Contact Information: Hydrographic surveyor - husband Anticipated Caregiver: Husband Anticipated Ambulance person Information: Tarri Fuller - spouse (c) [phone] Ability/Limitations of Caregiver: Husband with mild dementia but can assist at home.  Dtr works for [employer] and has variable schedule. Caregiver Availability: 24/7 Discharge Plan Discussed with Primary Caregiver: Yes Is Caregiver In Agreement with Plan?: Yes Does Caregiver/Family have Issues with Lodging/Transportation while Pt is in Rehab?: No  Goals/Additional Needs Patient/Family Goal for Rehab: PT/OT Supervision to min assist, ST mod I to supervision goals Expected length of stay: 13-18 days Cultural Considerations: None Dietary Needs: Heart diet, thin liquids Equipment Needs: TBD Pt/Family Agrees to Admission and willing to participate: Yes Program Orientation Provided & Reviewed with Pt/Caregiver Including Roles  & Responsibilities: Yes  Decrease burden of Care through IP rehab admission: No  Possible need for SNF placement upon discharge: Not planned  Patient Condition: This patient's condition remains as documented in the consult dated 08/10/14, in which the Rehabilitation Physician determined and documented that the patient's condition is appropriate for intensive rehabilitative care in an inpatient  rehabilitation facility. Will admit to inpatient rehab today.  Preadmission Screen Completed By:  Retta Diones, 08/11/2014 3:58 PM  ______________________________________________________________________   Discussed status with Dr. Naaman Plummer on 08/11/14 at 1557 and received telephone approval for admission today.  Admission Coordinator:  Retta Diones, time1558/Date07/22/16

## 2014-08-11 NOTE — Progress Notes (Signed)
[name] Staggers, MD Physician Signed Physical Medicine and Rehabilitation Consult Note 08/10/2014 10:11 AM  Related encounter: Admission (Current) from 08/08/2014 in [facility]        Physical Medicine and Rehabilitation Consult  Reason for Consult: Left sided weakness and slurred speech.  Referring Physician: Dr. Oneida Alar   HPI: [name] is a 74 y.o. female with history of CAD, HTN, CAS, 2-3 weeks history of intermittent LUE numbness and weakness who was admitted on 08/08/14 for R-CEA by Dr. Oneida Alar. Post procedure she developed left sided weakness and stat carotid duplex showed patent cervical ICA. CT head questioned acute R-MCA infarct and cerebral angiogram showed no occlusions or filling defects. MRI brain done revealing acute right parietal and posterior frontal lobe infarcts. 2D echo revealed EF 65-70% and no wall abnormality. Neurology felt that patient with thromboembolic stroke post op and to continue ASA 162 mg daily. Patient with resultant mild dysarthria, left sided weakness, higher level cognitive deficits. CIR recommended for follow up therapy.    Review of Systems  HENT: Negative for hearing loss and tinnitus.  Eyes: Negative for blurred vision and double vision.  Respiratory: Negative for cough and shortness of breath.  Cardiovascular: Negative for chest pain and palpitations.  Gastrointestinal: Negative for heartburn, nausea and abdominal pain.  Genitourinary: Positive for urgency (urge incontinence since last year) and frequency. Negative for dysuria.  Musculoskeletal: Positive for myalgias, back pain (chronic back pain radiating to Left knee/LE instability) and joint pain (OA right knee).   Left rotator cuff problems.  Skin: Negative for itching and rash.  Neurological: Positive for dizziness, sensory change (left side) and focal weakness. Negative for speech change and headaches.    Psychiatric/Behavioral: Negative for depression. The patient does not have insomnia.      Past Medical History  Diagnosis Date  . Carotid artery disease     a. 60-70% bilat ICA stenosis by dopplers 05/2012.  Marland Kitchen Other and unspecified hyperlipidemia     takes Lipitor daily  . CAD (coronary artery disease)     a. Mod dz 2010 initially mgd medically. b. 12/2012 - angina s/p PTCA/DES to mid-circumflex, PTCA/DES to first OM.   Marland Kitchen Barrett's esophagus   . SBO (small bowel obstruction)     a. Lysis of adhesions and ovarian cystectomy 04/2013 for sBO. b. Admitted 07/2013 for colonic obstruction due to suspected colitis, s/p partial colectomy/colostomy 08/01/13. Admission complicated by anasarca, acute resp failure requiring tracheostomy, decannulated 08/25/13. c. Recurrent SBO8/2015, NGT placed for decompression.   . Colonic obstruction due to suspected colitis; s/p colectomy/colostomy     a. See SBO.  Marland Kitchen Protein-calorie malnutrition, severe w/ electrolyte imbalance     a. Severe hypoalbuminemia leading to 3rd spacing including anasarca and pulm edema 07/2013.  Marland Kitchen DVT of axillary vein, acute right     a. Dx 07/2013 felt due to R IJ central line that had been inserted 7/14. Not on anticoag due to GIB issues and small cerebral bleed.  . Acute respiratory failure with hypoxia s/p tracheostomy 07/2013  . Intracranial hemorrhage     a. 08/11/13 - per DC summary, tiny SAH vs SDH done for altered mentation, anticoag stopped including aspirin.  . Anemia     a. 7-08/2013 felt due to recent critical illness/chronic disease.  . Complication of anesthesia     hard to wake up from anesthesia   . Hypertension     takes Metoprolol daily  .  HTN (hypertension)   . Heart murmur   . COPD (chronic obstructive pulmonary disease)   . Pneumonia     hx of->15 yrs ago  . History of bronchitis     > 110yr ago   . Dizziness      was on Bentyl which caused this-took off of it and no problems since  . Stroke early 80's    right sided weakness--TIA  . Joint pain   . Chronic back pain     deteriorating;DDD  . Neck pain     DDD  . History of hiatal hernia   . History of colon polyps   . History of kidney stones     was told it was stable but doesn't know for sure that she ever passed it  . Depression     takes Remeron and Zoloft daily  . GERD (gastroesophageal reflux disease)   . Arthritis     handds, knees & back     Past Surgical History  Procedure Laterality Date  . Appendectomy    . Cholecystectomy    . Colon resection    . Coronary angioplasty with stent placement  12/20/2012    STENT TO OM DR COOPER  . Rotator cuff repair Left   . Abdominal hysterectomy    . Colon surgery    . Foot surgery Left   . Hand surgery Bilateral     for ganglion cysts  . Laparotomy N/A 08/01/2013    Procedure: EXPLORATORY LAPAROTOMY; Surgeon: DHarl Bowie MD; [facility] Service: General; Laterality: N/A;  . Partial colectomy N/A 08/01/2013    Procedure: PARTIAL COLECTOMY; Surgeon: DHarl Bowie MD; [facility] Service: General; Laterality: N/A;  . Colostomy N/A 08/01/2013    Procedure: COLOSTOMY; Surgeon: DHarl Bowie MD; [place] [facility] Service: General; Laterality: N/A;  . Bowel resection N/A 08/01/2013    Procedure: SMALL BOWEL RESECTION; Surgeon: DHarl Bowie MD; [place] [facility] Service: General; Laterality: N/A;  . Tracheostomy      feinstein  . Left heart catheterization with coronary angiogram N/A 12/20/2012    Procedure: LEFT HEART CATHETERIZATION WITH CORONARY ANGIOGRAM; Surgeon: MBlane Ohara MD; [facility]; Service: Cardiovascular; Laterality: N/A;  . Cataract surgery Bilateral   .  Colostomy takedown  01/24/2014    dr bNinfa Linden . Colostomy takedown N/A 01/24/2014    Procedure: COLOSTOMY TAKEDOWN; Surgeon: DCoralie Keens MD; [facility] Service: General; Laterality: N/A;  . Tonsillectomy      Family History  Problem Relation Age of Onset  . Heart disease    . Varicose Veins Mother   . Heart disease Father   . Heart attack Father   . AAA (abdominal aortic aneurysm) Father     Social History: Married. Independent PTA. She reports that she has been smoking Cigarettes--has cut down to 1/2 PPD. She has a 26.5 pack-year smoking history. She has never used smokeless tobacco. She reports that she does not drink alcohol or use illicit drugs.    Allergies  Allergen Reactions  . Pregabalin Swelling    Tongue swelling  . Sulfonamide Derivatives Swelling    Childhood reaction    Medications Prior to Admission  Medication Sig Dispense Refill  . aspirin EC 81 MG tablet Take 162 mg by mouth daily.    .Marland Kitchenatorvastatin (LIPITOR) 80 MG tablet Take 80 mg by mouth every evening.     . docusate sodium (COLACE) 100 MG capsule Take 100 mg by mouth  2 (two) times daily.    . fish oil-omega-3 fatty acids 1000 MG capsule Take 1,000 mg by mouth daily.     . folic acid (FOLVITE) 732 MCG tablet Take 800 mcg by mouth daily.     . metoprolol succinate (TOPROL-XL) 25 MG 24 hr tablet TAKE 1 TABLET BY MOUTH DAILY (Patient taking differently: TAKE 1 TABLET BY MOUTH DAILY in the a.m.) 90 tablet 3  . mirtazapine (REMERON) 30 MG tablet Take 1 tablet by mouth at bedtime.     . nitroGLYCERIN (NITROSTAT) 0.4 MG SL tablet Place 1 tablet (0.4 mg total) under the tongue every 5 (five) minutes as needed for chest pain. 25 tablet 3  . ondansetron (ZOFRAN) 4 MG tablet Take 4 mg by mouth every 6 (six) hours as needed for nausea or vomiting.     . pantoprazole (PROTONIX) 40 MG tablet  TAKE 1 TABLET TWICE DAILY 180 tablet 1  . sertraline (ZOLOFT) 100 MG tablet Take 100 mg by mouth 2 (two) times daily.      Home: Home Living Family/patient expects to be discharged to:: Private residence Living Arrangements: Spouse/significant other Available Help at Discharge: Family, Available 24 hours/day Type of Home: Mobile home Home Access: Stairs to enter [employer] of Steps: Home with three platforms to enter  Entrance Stairs-Rails: None Home Layout: One level Bathroom Shower/Tub: Multimedia programmer: Programmer, systems: Yes Home Equipment: Wheelchair - manual, Environmental consultant - standard, Civil engineer, contracting, Environmental consultant - 2 wheels, [place] - single point, Bedside commode, Walker - 4 wheels, Adaptive equipment Adaptive Equipment: Reacher, Sock aid Additional Comments: Spouse supportive  Lives With: Spouse  Functional History: Prior Function Level of Independence: Independent Comments: Pt and spouse report that she was very independent PTA including household management and driving  Functional Status:  Mobility: Bed Mobility Overal bed mobility: Needs Assistance Bed Mobility: Supine to Sit Supine to sit: Mod assist, +2 for physical assistance General bed mobility comments: assist to move LEs off EOB and assist to lift shoulders  Transfers Overall transfer level: Needs assistance Equipment used: Rolling walker (2 wheeled) Transfers: Sit to/from Stand, [employer] Transfers Sit to Stand: Mod assist, +2 physical assistance Stand pivot transfers: Mod assist, +2 physical assistance General transfer comment: Pt requires +2 asisst for safety and balance. pt initially with Lt knee buckling, but this improved through session       ADL: ADL Overall ADL's : Needs assistance/impaired Eating/Feeding: Minimal assistance, Sitting Grooming: Wash/dry hands, Wash/dry face, Brushing hair, Minimal assistance, Sitting Upper Body Bathing: Moderate assistance,  Sitting Lower Body Bathing: Moderate assistance, Sit to/from stand Upper Body Dressing : Maximal assistance, Sitting Lower Body Dressing: Total assistance, Sit to/from stand Toilet Transfer: Minimal assistance, +2 for physical assistance, Stand-pivot, BSC, RW Toilet Transfer Details (indicate cue type and reason): verbal cues for techniques and safety. Assist due to impaired balance and lt sided weakness  Toileting- Clothing Manipulation and Hygiene: Total assistance, Sit to/from stand Functional mobility during ADLs: Rolling walker, +2 for physical assistance, Moderate assistance General ADL Comments: Pt requires asisst to access feet, for balance, problem solving, etc. ? component of Lt inattention   Cognition: Cognition Overall Cognitive Status: Impaired/Different from baseline Arousal/Alertness: Awake/alert Orientation Level: Oriented X4 Attention: Focused, Sustained Focused Attention: Appears intact Sustained Attention: Impaired Sustained Attention Impairment: Verbal complex, Functional complex Memory: Impaired Memory Impairment: Decreased recall of new information, Retrieval deficit Awareness: Appears intact Problem Solving: Impaired Problem Solving Impairment: Verbal complex, Functional complex Executive Function: Reasoning Reasoning: Impaired Reasoning  Impairment: Verbal complex, Functional complex Safety/Judgment: Appears intact Cognition Arousal/Alertness: Awake/alert Behavior During Therapy: Flat affect (labile at times ) Overall Cognitive Status: Impaired/Different from baseline Area of Impairment: Attention, Safety/judgement, Awareness, Problem solving Current Attention Level: Sustained Safety/Judgement: Decreased awareness of safety, Decreased awareness of deficits Awareness: Intellectual Problem Solving: Slow processing, Decreased initiation, Difficulty sequencing, Requires verbal cues, Requires tactile cues General Comments: Pt requires mod cues for safety and  processing   Blood pressure 124/40, pulse 81, temperature 98.8 F (37.1 C), temperature source Oral, resp. rate 21, height 5' (1.524 m), weight 69.4 kg (153 lb), SpO2 92 %. Physical Exam  Nursing note and vitals reviewed. Constitutional: She is oriented to person, place, and time. She appears well-developed and well-nourished.  HENT:  Head: Normocephalic and atraumatic.  Eyes: Conjunctivae are normal. Pupils are equal, round, and reactive to light.  Neck:  Decreased ROM. Right neck incision is clean, dry and intact.  Cardiovascular: Normal rate and regular rhythm.  Respiratory: Effort normal and breath sounds normal. No respiratory distress. She has no wheezes.  GI: She exhibits no distension.  Musculoskeletal: She exhibits no edema or tenderness.  Left shoulder discomfort with attempts at ROM.  Neurological: She is alert and oriented to person, place, and time. She displays no atrophy and no tremor. She exhibits normal muscle tone. She displays no seizure activity.  Speech clear. Able to follow simple one and two step commands without difficulty. Left facial weakness. Left hemisensory deficits. LUE: 1+ deltoid, pecs, absent bicep,tricep, wrist, hand. LLE: 2/5hf, 3/5 ke and 3+ to 4 adf/apf. Left central 7 and tongue deviation   Skin: Skin is warm and dry.     Lab Results Last 24 Hours    No results found for this or any previous visit (from the past 24 hour(s)).    Imaging Results (Last 48 hours)    Ct Head Wo Contrast  08/08/2014 CLINICAL DATA: Postop right carotid endarterectomy, performed earlier today. When patient awoke from anesthesia, she was unable to move the left upper extremity. EXAM: CT HEAD WITHOUT CONTRAST TECHNIQUE: Contiguous axial images were obtained from the base of the skull through the vertex without intravenous contrast. COMPARISON: 09/22/2013 and earlier. FINDINGS: Ventricular system normal in size and appearance for age. Rule core vague low-attenuation  involving the right parietal cortex approaching the vertex, not seen on prior examinations. No associated hemorrhage or hematoma. No extra-axial fluid collections. No associated mass effect or midline shift. Ventricular system normal in size and appearance for age. No skull fracture or other focal osseous abnormality involving the skull. Mucous retention cyst or polyp in the left maxillary sinus. Remaining visualized paranasal sinuses, bilateral mastoid air cells and bilateral middle ear cavities well aerated. Severe bilateral carotid siphon and vertebrobasilar atherosclerosis. IMPRESSION: 1. Possible acute nonhemorrhagic stroke in the posterior right middle cerebral artery distribution involving the right parietal lobe near the vertex. MRI with diffusion imaging may be confirmatory. 2. No acute intracranial abnormality otherwise. Critical Value/emergent results were called by telephone at the time of interpretation on 08/08/2014 at 5:51 pm to the post anesthesia care unit, and I was told that the patient has been taken to the neurointerventional radiology suite for diagnosis and treatment. Electronically Signed By: Evangeline Dakin M.D. On: 08/08/2014 17:56   Mr Brain Wo Contrast  08/09/2014 CLINICAL DATA: Left arm weakness. Right carotid endarterectomy yesterday. Evidence of right MCA territory infarct on head CT yesterday following the procedure. Subsequent cerebral angiogram without evidence of large vessel occlusion. EXAM: MRI HEAD  WITHOUT CONTRAST TECHNIQUE: Multiplanar, multiecho pulse sequences of the brain and surrounding structures were obtained without intravenous contrast. COMPARISON: Head CT 08/08/2014 and MRI 12/29/2007 FINDINGS: There is a moderate-sized acute cortical infarct involving the right parietal and posterior right frontal lobes corresponding to the low density seen on yesterday's CT. There are also smaller areas of patchy acute cortical infarction more anteriorly in the  right frontal lobe as well as more inferiorly in the right parietal and lateral right occipital lobes. There may be trace associated petechial hemorrhage in the right parietal lobe. There is no evidence of mass, midline shift, or extra-axial fluid collection. Ventricles and sulci are within normal limits for age. Minimal chronic small vessel ischemic disease is noted in the periventricular white matter bilaterally and in the brainstem. There is a chronic lacunar infarct in the right putamen. Mild mucosal thickening is noted in the maxillary sinuses and ethmoid air cells bilaterally. Mastoid air cells are clear. Major intracranial vascular flow voids are preserved. IMPRESSION: Acute right MCA territory infarcts, predominantly involving the parietal and posterior frontal lobes. Electronically Signed By: Logan Bores On: 08/09/2014 14:12     Assessment/Plan: Diagnosis: Right MCA infarct after CEA with left hemiparesis and sensory loss 1. Does the need for close, 24 hr/day medical supervision in concert with the patient's rehab needs make it unreasonable for this patient to be served in a less intensive setting? Yes 2. Co-Morbidities requiring supervision/potential complications: depression, htn, cad 3. Due to bladder management, bowel management, safety, skin/wound care, disease management, medication administration, pain management and patient education, does the patient require 24 hr/day rehab nursing? Yes 4. Does the patient require coordinated care of a physician, rehab nurse, PT (1-2 hrs/day, 5 days/week), OT (1-2 hrs/day, 5 days/week) and SLP (1-2 hrs/day, 5 days/week) to address physical and functional deficits in the context of the above medical diagnosis(es)? Yes Addressing deficits in the following areas: balance, endurance, locomotion, strength, transferring, bowel/bladder control, bathing, dressing, feeding, grooming, toileting, cognition, speech, swallowing and psychosocial  support 5. Can the patient actively participate in an intensive therapy program of at least 3 hrs of therapy per day at least 5 days per week? Yes 6. The potential for patient to make measurable gains while on inpatient rehab is excellent 7. Anticipated functional outcomes upon discharge from inpatient rehab are supervision and min assist with PT, supervision and min assist with OT, modified independent and supervision with SLP. 8. Estimated rehab length of stay to reach the above functional goals is: 13-18 days 9. Does the patient have adequate social supports and living environment to accommodate these discharge functional goals? Yes 10. Anticipated D/C setting: Home 11. Anticipated post D/C treatments: HH therapy and Outpatient therapy 12. Overall Rehab/Functional Prognosis: excellent  RECOMMENDATIONS: This patient's condition is appropriate for continued rehabilitative care in the following setting: CIR Patient has agreed to participate in recommended program. Yes Note that insurance prior authorization may be required for reimbursement for recommended care.  Comment: Rehab Admissions Coordinator to follow up.  Thanks,  [name] Staggers, MD, Mellody Drown     08/10/2014       Revision History     Date/Time User Provider Type Action   08/10/2014 1:06 PM [name] Staggers, MD Physician Sign   08/10/2014 12:56 PM Bary Leriche, PA-C Physician Assistant Share   View Details Report       Routing History     Date/Time From To Method   08/10/2014 1:06 PM [name] Staggers, MD [name] Staggers,  MD In Basket   08/10/2014 1:06 PM [name] Staggers, MD Laverna Peace, NP Fax

## 2014-08-11 NOTE — Discharge Summary (Signed)
Discharge Summary     [name] 05-24-1940 74 y.o. female  [id number]  Admission Date: 08/08/2014  Discharge Date: 08/11/14  Physician: Elam Dutch, MD  Admission Diagnosis: Right Internal Carotid Artery Stenosis  I65.21   HPI:   This is a 74 y.o. female who presents with chief complaint: Left arm numbness 2-3 times per week and weakness. The patient presents today for F/U visit after seeing Dr. Eddie Dibbles at [facility]. He diagnosed her with rotator cuff impingement and gave her an interarticular left shoulder injection. She states her shoulder feels better, but he numbness is still present. She denies any symptoms of classic TIA amaurosis or stroke. She is currently on aspirin once daily. She has a history of tracheostomy due to a previous illness and bowel resection.   Hospital Course:  The patient was admitted to the hospital and taken to the operating room on 08/08/2014 and underwent right carotid endarterectomy.  The pt tolerated the procedure well and was transported to the PACU in good condition.  In PACU, pt was requiring dopamine for BP support and did need this for a couple of days, but was weaned off by POD 3.   In the PACU, she was found to have right arm weakness at 3pm.  A stat duplex was performed, which was unremarkable.  A stat CTA of the head and neck was ordered.    Pt had been waiting on CT scan for 2 hours due to problems with IV for CT scan. Her duplex showed a patent cervical ICA. She has now had at least 3 hours of ischemia time with no improvement. Still needs head CT to rule out bleed. Will consult neurology as deficit does not appear to be resolving and will need their involvement for management of stroke. She is not a candidate for TPA.  Family has been updated twice that she has left arm weakness and we are waiting on CT results.  She then underwent cerebral angiogram by IR and was found to have a widely patent right  endarterectomy site with no gross filling defects or occlusions.    Neurology consult was obtained.  Head CT showed possible right MCA infarct.  Pt is to continue risk factor control and quit smoking.  A hgbA1c was 6.2.  She is on a statin and aspirin.     By POD 1, the pt's left arm continued to be flaccid.  A MRI was obtained, which revealed acute right MCA territory infarcts, predominantly involving the parietal and posterior frontal lobes.  A echocardiogram was obtained with the following results: Study Conclusions  - Left ventricle: The cavity size was normal. There was mild concentric hypertrophy. Systolic function was vigorous. The estimated ejection fraction was in the range of 65% to 70%. Wall motion was normal; there were no regional wall motion abnormalities. Doppler parameters are consistent with elevated ventricular end-diastolic filling pressure. - Aortic valve: Trileaflet; normal thickness leaflets. There was no regurgitation. - Aortic root: The aortic root was normal in size. - Left atrium: The atrium was mildly dilated. - Right ventricle: Systolic function was normal. - Right atrium: The atrium was normal in size. - Tricuspid valve: There was trivial regurgitation. - Pulmonic valve: There was no regurgitation. - Pulmonary arteries: Systolic pressure was within the normal range. - Inferior vena cava: The vessel was normal in size. - Pericardium, extracardiac: There was no pericardial effusion.  A CIR consult was obtained and is a candidate for inpatient rehab.  Her insurance  has approved her for CIR and she will be discharged there today.    Recent Labs  08/08/14 1214 08/09/14 0400  NA 142 138  K 3.9 3.6  CL  --  104  CO2  --  25  GLUCOSE  --  128*  BUN  --  16  CALCIUM  --  8.2*    Recent Labs  08/08/14 1214 08/09/14 0400  WBC  --  9.8  HGB 10.9* 9.1*  HCT 32.0* 28.5*  PLT  --  207   No results for input(s): INR in the last 72  hours.   Discharge Instructions    Ambulatory referral to Neurology    Complete by:  As directed   Dr. Erlinda Hong requests followup in 2 months     CAROTID Sugery: Call MD for difficulty swallowing or speaking; weakness in arms or legs that is a new symtom; severe headache.  If you have increased swelling in the neck and/or  are having difficulty breathing, CALL 911    Complete by:  As directed      Call MD for:  redness, tenderness, or signs of infection (pain, swelling, bleeding, redness, odor or green/yellow discharge around incision site)    Complete by:  As directed      Call MD for:  severe or increased pain, loss or decreased feeling  in affected limb(s)    Complete by:  As directed      Call MD for:  temperature >100.5    Complete by:  As directed      Discharge wound care:    Complete by:  As directed   Shower daily with soap and water starting 08/10/14     Driving Restrictions    Complete by:  As directed   No driving for 2 weeks     Lifting restrictions    Complete by:  As directed   No lifting for 2 weeks     Resume previous diet    Complete by:  As directed            Discharge Diagnosis:  Right Internal Carotid Artery Stenosis  I65.21  Secondary Diagnosis: Patient Active Problem List   Diagnosis Date Noted  . Carotid artery stenosis, symptomatic 08/08/2014  . Carotid disease, bilateral 06/02/2014  . Coronary artery disease due to lipid rich plaque 06/02/2014  . Essential hypertension 06/02/2014  . Hyperlipidemia 06/02/2014  . Acute respiratory failure   . S/P colostomy takedown 01/24/2014  . Chronic diastolic CHF (congestive heart failure), NYHA class 2 11/10/2013  . Atypical chest pain 10/20/2013  . Malnutrition of moderate degree 09/18/2013  . SBO (small bowel obstruction) 09/17/2013  . H/O tracheostomy 09/17/2013  . Acute respiratory failure with hypoxia s/p tracheostomy 08/19/2013  . Physical deconditioning 08/19/2013  . HTN (hypertension) 08/19/2013  .  Dehydration with hypernatremia 08/19/2013  . DVT of axillary vein, acute right 08/19/2013  . Anemia 08/19/2013  . Anasarca 08/14/2013  . Hypokalemia 07/31/2013  . Hypomagnesemia 07/31/2013  . Protein-calorie malnutrition, severe w/ electrolyte imbalance 07/31/2013  . Colonic obstruction due to suspected colitis; s/p colectomy/colostomy 07/29/2013  . CAD (coronary artery disease)   . Depression   . Barrett's esophagus   . Arthritis   . Other and unspecified angina pectoris 12/20/2012  . Chest pain 12/14/2012  . TOBACCO ABUSE 11/08/2009  . Hyperlipidemia, mixed 04/17/2008  . Carotid artery disease 04/17/2008   Past Medical History  Diagnosis Date  . Carotid artery disease     a.  60-70% bilat ICA stenosis by dopplers 05/2012.  Marland Kitchen Other and unspecified hyperlipidemia     takes Lipitor daily  . CAD (coronary artery disease)     a. Mod dz 2010 initially mgd medically. b. 12/2012 - angina s/p PTCA/DES to mid-circumflex, PTCA/DES to first OM.   Marland Kitchen Barrett's esophagus   . SBO (small bowel obstruction)     a. Lysis of adhesions and ovarian cystectomy 04/2013 for sBO. b. Admitted 07/2013 for colonic obstruction due to suspected colitis, s/p partial colectomy/colostomy 08/01/13. Admission complicated by anasarca, acute resp failure requiring tracheostomy, decannulated 08/25/13. c. Recurrent SBO8/2015, NGT placed for decompression.   . Colonic obstruction due to suspected colitis; s/p colectomy/colostomy     a. See SBO.  Marland Kitchen Protein-calorie malnutrition, severe w/ electrolyte imbalance     a. Severe hypoalbuminemia leading to 3rd spacing including anasarca and pulm edema 07/2013.  Marland Kitchen DVT of axillary vein, acute right     a. Dx 07/2013 felt due to R IJ central line that had been inserted 7/14. Not on anticoag due to GIB issues and small cerebral bleed.  . Acute respiratory failure with hypoxia s/p tracheostomy 07/2013  . Intracranial hemorrhage     a. 08/11/13 - per DC summary, tiny SAH vs SDH done for  altered mentation, anticoag stopped including aspirin.  . Anemia     a. 7-08/2013 felt due to recent critical illness/chronic disease.  . Complication of anesthesia     hard to wake up from anesthesia   . Hypertension     takes Metoprolol daily  . HTN (hypertension)   . Heart murmur   . COPD (chronic obstructive pulmonary disease)   . Pneumonia     hx of->15 yrs ago  . History of bronchitis     > 27yr ago   . Dizziness     was on Bentyl which caused this-took off of it and no problems since  . Stroke early 80's    right sided weakness--TIA  . Joint pain   . Chronic back pain     deteriorating;DDD  . Neck pain     DDD  . History of hiatal hernia   . History of colon polyps   . History of kidney stones     was told it was stable but doesn't know for sure that she ever passed it  . Depression     takes Remeron and Zoloft daily  . GERD (gastroesophageal reflux disease)   . Arthritis     handds, knees & back       Medication List    TAKE these medications        aspirin EC 81 MG tablet  Take 162 mg by mouth daily.     atorvastatin 80 MG tablet  Commonly known as:  LIPITOR  Take 80 mg by mouth every evening.     docusate sodium 100 MG capsule  Commonly known as:  COLACE  Take 100 mg by mouth 2 (two) times daily.     fish oil-omega-3 fatty acids 1000 MG capsule  Take 1,000 mg by mouth daily.     folic acid 8500MCG tablet  Commonly known as:  FOLVITE  Take 800 mcg by mouth daily.     metoprolol succinate 25 MG 24 hr tablet  Commonly known as:  TOPROL-XL  TAKE 1 TABLET BY MOUTH DAILY     mirtazapine 30 MG tablet  Commonly known as:  REMERON  Take 1 tablet by mouth at bedtime.  nitroGLYCERIN 0.4 MG SL tablet  Commonly known as:  NITROSTAT  Place 1 tablet (0.4 mg total) under the tongue every 5 (five) minutes as needed for chest pain.     ondansetron 4 MG tablet  Commonly known as:  ZOFRAN  Take 4 mg by mouth every 6 (six) hours as needed for nausea or  vomiting.     oxyCODONE 5 MG immediate release tablet  Commonly known as:  ROXICODONE  Take 1 tablet (5 mg total) by mouth every 6 (six) hours as needed.     pantoprazole 40 MG tablet  Commonly known as:  PROTONIX  TAKE 1 TABLET TWICE DAILY     sertraline 100 MG tablet  Commonly known as:  ZOLOFT  Take 100 mg by mouth 2 (two) times daily.        Prescriptions given: Roxicodone #20 No Refill  Disposition: CIR  Patient's condition: is stable  Follow up: 1. Dr. Oneida Alar in 2 weeks.   Leontine Locket, PA-C [facility] [phone]  --- For [facility] use --- Instructions: Press F2 to tab through selections.  Delete question if not applicable.   Modified Rankin score at D/C (0-6): 3  IV medication needed for:  1. Hypertension: No 2. Hypotension: Yes  Post-op Complications: Yes  1. Post-op CVA or TIA: Yes  If yes: Event classification (right eye, left eye, right cortical, left cortical, verterobasilar, other): left arm hemiparesis  If yes: Timing of event (intra-op, <6 hrs post-op, >=6 hrs post-op, unknown): < 6 hrs post op  2. CN injury: No  If yes: CN n/a injuried   3. Myocardial infarction: No  If yes: Dx by (EKG or clinical, Troponin): n/a  4.  CHF: No  5.  Dysrhythmia (new): No  6. Wound infection: No  7. Reperfusion symptoms: No  8. Return to OR: No  If yes: return to OR for (bleeding, neurologic, other CEA incision, other): n/a  Discharge medications: Statin use:  Yes If No: '[ ]'$  For Medical reasons, '[ ]'$  Non-compliant, '[ ]'$  Not-indicated ASA use:  Yes  If No: '[ ]'$  For Medical reasons, '[ ]'$  Non-compliant, '[ ]'$  Not-indicated Beta blocker use:  Yes If No: '[ ]'$  For Medical reasons, '[ ]'$  Non-compliant, '[ ]'$  Not-indicated ACE-Inhibitor use:  No If No: '[ ]'$  For Medical reasons, '[ ]'$  Non-compliant, '[ ]'$  Not-indicated P2Y12 Antagonist use: No, '[ ]'$  Plavix, '[ ]'$  Plasugrel, '[ ]'$  Ticlopinine, '[ ]'$  Ticagrelor, '[ ]'$  Other, '[ ]'$  No for medical reason, '[ ]'$   Non-compliant, '[ ]'$  Not-indicated Anti-coagulant use:  No, '[ ]'$  Warfarin, '[ ]'$  Rivaroxaban, '[ ]'$  Dabigatran, '[ ]'$  Other, '[ ]'$  No for medical reason, '[ ]'$  Non-compliant, '[ ]'$  Not-indicated

## 2014-08-11 NOTE — Care Management Important Message (Signed)
Important Message  Patient Details  Name: CATRINIA RACICOT MRN: [id number] Date of Birth: 1940-08-18   Medicare Important Message Given:  [facility] notification given    Nathen May 08/11/2014, 12:12 [place] Message  Patient Details  Name: DEREONA KOLODNY MRN: [id number] Date of Birth: 04/27/1940   Medicare Important Message Given:  Yes-second notification given    Nathen May 08/11/2014, 12:12 PM

## 2014-08-11 NOTE — Progress Notes (Addendum)
  Progress Note    08/11/2014 7:36 AM 3 Days Post-Op  Subjective:  Still no movement left arm-feels like pins and needles  Tm 99 HR 70's-80's NSR 630'Z-601'U systolic  (dopamine gtt off this morning and BP 110's) Filed Vitals:   08/11/14 0630  BP: 116/42  Pulse:   Temp:   Resp: 23     Physical Exam: Neuro:  Left arm hemiparesis and marginal mandibular palsy Incision:  C/d/i  CBC    Component Value Date/Time   WBC 9.8 08/09/2014 0400   RBC 3.55* 08/09/2014 0400   HGB 9.1* 08/09/2014 0400   HCT 28.5* 08/09/2014 0400   PLT 207 08/09/2014 0400   MCV 80.3 08/09/2014 0400   MCH 25.6* 08/09/2014 0400   MCHC 31.9 08/09/2014 0400   RDW 16.5* 08/09/2014 0400   LYMPHSABS 1.6 09/17/2013 0711   MONOABS 0.5 09/17/2013 0711   EOSABS 0.1 09/17/2013 0711   BASOSABS 0.0 09/17/2013 0711    BMET    Component Value Date/Time   NA 138 08/09/2014 0400   K 3.6 08/09/2014 0400   CL 104 08/09/2014 0400   CO2 25 08/09/2014 0400   GLUCOSE 128* 08/09/2014 0400   BUN 16 08/09/2014 0400   CREATININE 0.68 08/09/2014 0400   CALCIUM 8.2* 08/09/2014 0400   GFRNONAA >60 08/09/2014 0400   GFRAA >60 08/09/2014 0400     Intake/Output Summary (Last 24 hours) at 08/11/14 0736 Last data filed at 08/11/14 0607  Gross per 24 hour  Intake 1671.59 ml  Output    350 ml  Net 1321.59 ml      Assessment/Plan:  This is a 74 y.o. female who is s/p right  CEA 3 Days Post-Op  -pt continues with no movement left arm-sensation is decreased from past couple of days -CIR evaluating-feel she could definitely benefit.  Hopefully she can go to CIR today.   Leontine Locket, PA-C [facility] [phone]   Moving left arm some off the pillow, no real hand movement Stood on left leg yesterday She is now off Dopamine Pt amenable to rehab Hopefully to rehab today   Ruta Hinds, MD [facility] [place]: [phone] Pager: [phone]

## 2014-08-11 NOTE — Care Management Note (Signed)
Case Management Note  Patient Details  Name: TAMIKO LEOPARD MRN: [id number] Date of Birth: November 15, 1940  Subjective/Objective:                 From home with husband s/p CEA, postop cva  Action/Plan: INPT rehab, awaiting insurance approval. CM to f/u with d/c disposition.  Expected Discharge Date:                  Expected Discharge Plan:  [place]  In-House Referral:     Discharge planning Services  CM Consult  Post Acute Care Choice:    Choice offered to:     DME Arranged:    DME Agency:     HH Arranged:    Miner Agency:     Status of Service:  In process, will continue to follow  Medicare Important Message Given:    Date Medicare IM Given:    Medicare IM give by:    Date Additional Medicare IM Given:    Additional Medicare Important Message give by:     If discussed at [place] of Stay Meetings, dates discussed:    Additional Comments: Janeane Cozart (Spouse) [phone]  Sharin Mons, RN 08/11/2014, 12:08 PM

## 2014-08-11 NOTE — Progress Notes (Signed)
Rehab admissions - I have authorization for acute inpatient rehab admission for today.  I have contacted Aldona Bar, PA to let her know.  After much discussion, patient/family are in agreement to inpatient rehab admission.  Patient has been worried about costs of her ongoing medical bills.  Bed available and will admit to acute inpatient rehab today.  Call me for questions.  #[phone]

## 2014-08-11 NOTE — Interval H&P Note (Signed)
[name] was admitted today to Inpatient Rehabilitation with the diagnosis of Right MCA infarct.  The patient's history has been reviewed, patient examined, and there is no change in status.  Patient continues to be appropriate for intensive inpatient rehabilitation.  I have reviewed the patient's chart and labs.  Questions were answered to the patient's satisfaction. The PAPE has been reviewed and assessment remains appropriate.  [name] T 08/11/2014, 10:39 PM

## 2014-08-11 NOTE — Progress Notes (Signed)
Rehab admissions - I met with patient and her husband today.  Husband tells me that he has mild demential.  Patient is worried about medical bills.  I have opened the case and will discuss benefits with patient/spouse later today.  I will let all know once I hear back from [facility] carrier.  We do have beds open on rehab today and tomorrow if I get insurance authorization.  Call me for questions.  #[phone]

## 2014-08-11 NOTE — Progress Notes (Signed)
Physical Therapy Treatment Patient Details Name: [name] MRN: [id number] DOB: [birth year] Today's Date: 08/11/2014    History of Present Illness This 74 y.o. female admitted for Rt carotid endarterectomy.  Post op, pt had acute onset Lt UE weakness.   MRI of brain revealed:  Acute right MCA territory infarcts, predominantly involving the parietal and posterior frontal lobes.               PT Comments    Pt admitted with above diagnosis. Pt currently with functional limitations due to balance, strength, coordination and endurance deficits. Pt is improving increasing distance.  Still with deficits to include poor safety awareness and left hemibody weakness as well as some deficits on the right LE.  REady for Rehab.  Pt will benefit from skilled PT to increase their independence and safety with mobility to allow discharge to the venue listed below.    Follow Up Recommendations  CIR;Supervision/Assistance - 24 hour     Equipment Recommendations  Other (comment) (TBA)    Recommendations for Other Services Rehab consult     Precautions / Restrictions Precautions Precautions: Fall Restrictions Weight Bearing Restrictions: No    Mobility  Bed Mobility Overal bed mobility: Needs Assistance Bed Mobility: Supine to Sit     Supine to sit: Min assist;HOB elevated;Mod assist     General bed mobility comments: assist to move LEs off EOB and assist to lift shoulders.    Transfers Overall transfer level: Needs assistance Equipment used: Rolling walker (2 wheeled) Transfers: Sit to/from Stand Sit to Stand: Mod assist;+2 physical assistance         General transfer comment: Pt requires +2 asisst for safety and balance.  Pt left UE weak.  Pt required assist to place left UE onto RW and to remove left UE when sitting as pt does not have the strength nor coordination to do so.  Also suspect left neglect vs. inattention as well.    Ambulation/Gait Ambulation/Gait assistance:  Mod assist;+2 physical assistance Ambulation Distance (Feet): 52 Feet (ambulated 52 feet x 2 with a 5 min seated rest break.) Assistive device: Rolling walker (2 wheeled) Gait Pattern/deviations: Step-to pattern;Decreased step length - right;Decreased step length - left;Decreased weight shift to right;Decreased dorsiflexion - left;Shuffle;Ataxic;Staggering left;Staggering right;Drifts right/left;Narrow base of support   Gait velocity interpretation: Below normal speed for age/gender General Gait Details: Pt was unable to grip RW with PT assisting pt with holding left UE on RW entire time.  Left grip weakening more as pt walked.  Pt needed cues and assist to sequence steps and RW.  Pt needed assist to weight shift bil at times with very narrow BOS and walking toward left of RW but could widen BOS with commands.  Pt pushing RW with right UE which is why RW was not steering correctly therefore needed assist to steer RW.  Pt with uncoordinated movement of LEs at times.  As pt fatigued, posture worsened and pt needed more assist.  Followed pt with chair and had to cue her when to sit down as she has decr safety awareness of when to sit down.     Stairs            Wheelchair Mobility    Modified Rankin (Stroke Patients Only) Modified Rankin (Stroke Patients Only) Pre-Morbid Rankin Score: No symptoms Modified Rankin: Moderately severe disability     Balance Overall balance assessment: Needs assistance Sitting-balance support: No upper extremity supported;Feet supported Sitting balance-Leahy Scale: Poor Sitting balance - Comments:  required min A for EOB sitting    Standing balance support: Bilateral upper extremity supported;During functional activity Standing balance-Leahy Scale: Poor Standing balance comment: required mod assist for standing balance as pt has poor postural stability overall due to left LE weaknesss and decr awareness.                      Cognition  Arousal/Alertness: Awake/alert Behavior During Therapy: Flat affect (labile at times ) Overall Cognitive Status: Impaired/Different from baseline Area of Impairment: Attention;Safety/judgement;Awareness;Problem solving   Current Attention Level: Sustained     Safety/Judgement: Decreased awareness of safety;Decreased awareness of deficits Awareness: Intellectual Problem Solving: Slow processing;Decreased initiation;Difficulty sequencing;Requires verbal cues;Requires tactile cues General Comments: Pt requires mod cues for safety and processing     Exercises General Exercises - Lower Extremity Ankle Circles/Pumps: AROM;Both;10 reps;Supine Quad Sets: AROM;Both;10 reps;Supine Long Arc Quad: AROM;Both;10 reps;Seated Hip Flexion/Marching: AROM;Both;10 reps;Seated Other Exercises Other Exercises: Reviewed left UE exercise with pt and husband. Pt is able to push her left UE forward but could not extend fingers.     General Comments General comments (skin integrity, edema, etc.): spouse present and discussed frequency if pt is still on acute over weekend.         Pertinent Vitals/Pain Pain Assessment: No/denies pain Faces Pain Scale: Hurts even more Pain Location: back Pain Intervention(s): Repositioned  VSS    Home Living                      Prior Function            PT Goals (current goals can now be found in the care plan section) Progress towards PT goals: Progressing toward goals    Frequency  Min 4X/week    PT Plan Current plan remains appropriate    Co-evaluation             End of Session Equipment Utilized During Treatment: Gait belt Activity Tolerance: Patient limited by fatigue Patient left: in chair;with call bell/phone within reach;with family/visitor present     Time: 5997-7414 PT Time Calculation (min) (ACUTE ONLY): 24 min  Charges:  $Gait Training: 8-22 mins $Therapeutic Exercise: 8-22 mins                    G CodesIrwin Brakeman [name] 09/03/14, 11:30 AM [name],PT Acute Rehabilitation [phone] (pager)

## 2014-08-12 ENCOUNTER — Inpatient Hospital Stay (HOSPITAL_COMMUNITY): Payer: Commercial Managed Care - HMO | Admitting: *Deleted

## 2014-08-12 ENCOUNTER — Inpatient Hospital Stay (HOSPITAL_COMMUNITY): Payer: Commercial Managed Care - HMO

## 2014-08-12 ENCOUNTER — Encounter (HOSPITAL_COMMUNITY): Payer: Self-pay

## 2014-08-12 ENCOUNTER — Inpatient Hospital Stay (HOSPITAL_COMMUNITY): Payer: Commercial Managed Care - HMO | Admitting: Speech Pathology

## 2014-08-12 DIAGNOSIS — G8190 Hemiplegia, unspecified affecting unspecified side: Secondary | ICD-10-CM

## 2014-08-12 LAB — CBC WITH DIFFERENTIAL/PLATELET
Basophils Absolute: 0 10*3/uL (ref 0.0–0.1)
Basophils Relative: 0 % (ref 0–1)
EOS ABS: 0.3 10*3/uL (ref 0.0–0.7)
EOS PCT: 5 % (ref 0–5)
HCT: 26 % — ABNORMAL LOW (ref 36.0–46.0)
HEMOGLOBIN: 8.5 g/dL — AB (ref 12.0–15.0)
LYMPHS PCT: 33 % (ref 12–46)
Lymphs Abs: 2 10*3/uL (ref 0.7–4.0)
MCH: 26 pg (ref 26.0–34.0)
MCHC: 32.7 g/dL (ref 30.0–36.0)
MCV: 79.5 fL (ref 78.0–100.0)
MONO ABS: 0.5 10*3/uL (ref 0.1–1.0)
MONOS PCT: 8 % (ref 3–12)
Neutro Abs: 3.4 10*3/uL (ref 1.7–7.7)
Neutrophils Relative %: 54 % (ref 43–77)
Platelets: 171 10*3/uL (ref 150–400)
RBC: 3.27 MIL/uL — ABNORMAL LOW (ref 3.87–5.11)
RDW: 16.9 % — ABNORMAL HIGH (ref 11.5–15.5)
WBC: 6.3 10*3/uL (ref 4.0–10.5)

## 2014-08-12 LAB — COMPREHENSIVE METABOLIC PANEL
ALT: 17 U/L (ref 14–54)
AST: 19 U/L (ref 15–41)
Albumin: 2.8 g/dL — ABNORMAL LOW (ref 3.5–5.0)
Alkaline Phosphatase: 77 U/L (ref 38–126)
Anion gap: 9 (ref 5–15)
BUN: 16 mg/dL (ref 6–20)
CALCIUM: 8.6 mg/dL — AB (ref 8.9–10.3)
CO2: 23 mmol/L (ref 22–32)
CREATININE: 0.6 mg/dL (ref 0.44–1.00)
Chloride: 109 mmol/L (ref 101–111)
GFR calc Af Amer: >60 mL/min (ref >60)
GFR calc non Af Amer: >60 mL/min (ref >60)
Glucose, Bld: 110 mg/dL — ABNORMAL HIGH (ref 65–99)
Potassium: 3.5 mmol/L (ref 3.5–5.1)
Sodium: 141 mmol/L (ref 135–145)
Total Bilirubin: 0.7 mg/dL (ref 0.3–1.2)
Total Protein: 5.7 g/dL — ABNORMAL LOW (ref 6.5–8.1)

## 2014-08-12 MED ORDER — SORBITOL 70 % SOLN
30.0000 mL | Freq: Every day | Status: DC | PRN
  Administered 2014-08-12 – 2014-08-14 (×2): 30 mL via ORAL
  Filled 2014-08-12 (×3): qty 30

## 2014-08-12 NOTE — Progress Notes (Signed)
Patient admitted to the Rehab department at approx. 1945 on 08/10/2013 via bed, accompany with 1 nurse, 1 NT and husband. Pt. was oriented to unit and room. Assessment completed as ordered. Pt. noted with an intact blister to right groin area. No BM since 08/07/2014, patient refused suppository, prune juice given, awaiting results.  Pt. came with a tablet and ring, husband stated he was taking both items home.

## 2014-08-12 NOTE — Evaluation (Signed)
Speech Language Pathology Assessment and Plan  Patient Details  Name: [name] MRN: [id number] Date of Birth: 04/13/1940  SLP Diagnosis: Cognitive Impairments  Rehab Potential: Good ELOS: 14-21 days    Today's Date: 08/12/2014 SLP Individual Time: 69-1445 SLP Individual Time Calculation (min): 60 min   Problem List:  Patient Active Problem List   Diagnosis Date Noted  . Acute right arterial ischemic stroke, middle cerebral artery (MCA) 08/11/2014  . Carotid artery stenosis, symptomatic 08/08/2014  . Carotid disease, bilateral 06/02/2014  . Coronary artery disease due to lipid rich plaque 06/02/2014  . Essential hypertension 06/02/2014  . Hyperlipidemia 06/02/2014  . Acute respiratory failure   . S/P colostomy takedown 01/24/2014  . Chronic diastolic CHF (congestive heart failure), NYHA class 2 11/10/2013  . Atypical chest pain 10/20/2013  . Malnutrition of moderate degree 09/18/2013  . SBO (small bowel obstruction) 09/17/2013  . H/O tracheostomy 09/17/2013  . Acute respiratory failure with hypoxia s/p tracheostomy 08/19/2013  . Physical deconditioning 08/19/2013  . HTN (hypertension) 08/19/2013  . Dehydration with hypernatremia 08/19/2013  . DVT of axillary vein, acute right 08/19/2013  . Anemia 08/19/2013  . Anasarca 08/14/2013  . Hypokalemia 07/31/2013  . Hypomagnesemia 07/31/2013  . Protein-calorie malnutrition, severe w/ electrolyte imbalance 07/31/2013  . Colonic obstruction due to suspected colitis; s/p colectomy/colostomy 07/29/2013  . CAD (coronary artery disease)   . Depression   . Barrett's esophagus   . Arthritis   . Other and unspecified angina pectoris 12/20/2012  . Chest pain 12/14/2012  . TOBACCO ABUSE 11/08/2009  . Hyperlipidemia, mixed 04/17/2008  . Carotid artery disease 04/17/2008   Past Medical History:  Past Medical History  Diagnosis Date  . Carotid artery disease     a. 60-70% bilat ICA stenosis by dopplers 05/2012.  Marland Kitchen Other  and unspecified hyperlipidemia     takes Lipitor daily  . CAD (coronary artery disease)     a. Mod dz 2010 initially mgd medically. b. 12/2012 - angina s/p PTCA/DES to mid-circumflex, PTCA/DES to first OM.   Marland Kitchen Barrett's esophagus   . SBO (small bowel obstruction)     a. Lysis of adhesions and ovarian cystectomy 04/2013 for sBO. b. Admitted 07/2013 for colonic obstruction due to suspected colitis, s/p partial colectomy/colostomy 08/01/13. Admission complicated by anasarca, acute resp failure requiring tracheostomy, decannulated 08/25/13. c. Recurrent SBO8/2015, NGT placed for decompression.   . Colonic obstruction due to suspected colitis; s/p colectomy/colostomy     a. See SBO.  Marland Kitchen Protein-calorie malnutrition, severe w/ electrolyte imbalance     a. Severe hypoalbuminemia leading to 3rd spacing including anasarca and pulm edema 07/2013.  Marland Kitchen DVT of axillary vein, acute right     a. Dx 07/2013 felt due to R IJ central line that had been inserted 7/14. Not on anticoag due to GIB issues and small cerebral bleed.  . Acute respiratory failure with hypoxia s/p tracheostomy 07/2013  . Intracranial hemorrhage     a. 08/11/13 - per DC summary, tiny SAH vs SDH done for altered mentation, anticoag stopped including aspirin.  . Anemia     a. 7-08/2013 felt due to recent critical illness/chronic disease.  . Complication of anesthesia     hard to wake up from anesthesia   . Hypertension     takes Metoprolol daily  . HTN (hypertension)   . Heart murmur   . COPD (chronic obstructive pulmonary disease)   . Pneumonia     hx of->15 yrs ago  . History of bronchitis     >  48yr ago   . Dizziness     was on Bentyl which caused this-took off of it and no problems since  . Stroke early 80's    right sided weakness--TIA  . Joint pain   . Chronic back pain     deteriorating;DDD  . Neck pain     DDD  . History of hiatal hernia   . History of colon polyps   . History of kidney stones     was told it was stable but  doesn't know for sure that she ever passed it  . Depression     takes Remeron and Zoloft daily  . GERD (gastroesophageal reflux disease)   . Arthritis     handds, knees & back    Past Surgical History:  Past Surgical History  Procedure Laterality Date  . Appendectomy    . Cholecystectomy    . Colon resection    . 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 Rotator cuff repair Left   . Abdominal hysterectomy    . Colon surgery    . Foot surgery Left   . Hand surgery Bilateral     for ganglion cysts  . Laparotomy N/A 08/01/2013    Procedure: EXPLORATORY LAPAROTOMY;  Surgeon: DHarl Bowie MD;  [facility]  Service: General;  Laterality: N/A;  . Partial colectomy N/A 08/01/2013    Procedure: PARTIAL COLECTOMY;  Surgeon: DHarl Bowie MD;  [facility]  Service: General;  Laterality: N/A;  . Colostomy N/A 08/01/2013    Procedure: COLOSTOMY;  Surgeon: DHarl Bowie MD;  [facility]  Service: General;  Laterality: N/A;  . Bowel resection N/A 08/01/2013    Procedure: SMALL BOWEL RESECTION;  Surgeon: DHarl Bowie MD;  [facility]  Service: General;  Laterality: N/A;  . Tracheostomy      feinstein  . Left heart catheterization with coronary angiogram N/A 12/20/2012    Procedure: LEFT HEART CATHETERIZATION WITH CORONARY ANGIOGRAM;  Surgeon: MBlane Ohara MD;  [facility];  Service: Cardiovascular;  Laterality: N/A;  . Cataract surgery Bilateral   . Colostomy takedown  01/24/2014    dr bNinfa Linden . Colostomy takedown N/A 01/24/2014    Procedure: COLOSTOMY TAKEDOWN;  Surgeon: DCoralie Keens MD;  [facility]  Service: General;  Laterality: N/A;  . Tonsillectomy    . Endarterectomy Right 08/08/2014    Procedure: RIGHT CAROTID ENDARTERECTOMY WITH HEMASHIELD PATCH ANGIOPLASTY;  Surgeon: CElam Dutch MD;  [facility]  Service: Vascular;  Laterality: Right;    Assessment / Plan /  Recommendation Clinical Impression PHOLLYN STUCKYis a 74y.o. female with history of CAD, HTN, CAS, 2-3 weeks history of intermittent LUE numbness and weakness who was admitted on 08/08/14 for R-CEA by Dr. FOneida Alar Post procedure she developed left sided weakness and stat carotid duplex showed patent cervical ICA. CT head questioned acute R-MCA infarct and cerebral angiogram showed no occlusions or filling defects. MRI brain done revealing acute right parietal and posterior frontal lobe infarcts.  Neurology felt that patient with thromboembolic stroke post op and to continue ASA 162 mg daily. Patient with resultant mild dysarthria, left sided weakness, higher level cognitive deficits. CIR was recommended for follow up therapy.  [facility] and demonstrates mild higher level cognitive impairments impacting memory, problem solving, and reasoning  skills which impacts the patient's overall safety with functional tasks.  Patient would benefit from skilled SLP intervention to maximize her cognitive skills, in order to maximize her functional independence prior to discharge.  Anticipate patient will require 24 hours supervision at home and possible follow up SLP services.  Skilled Therapeutic Interventions          Skilled cognitive-linguistic evaluation completed. See above; reviewed results and recommendations with patient and family.  SLP Assessment  Patient will need skilled [facility] during CIR admission    Recommendations  Patient destination: Home with 24 hour supervision with possible out-patient speech-language therapy intervention.    SLP Frequency 3 to 5 out of 7 days   SLP Treatment/Interventions Cognitive remediation/compensation;Cueing hierarchy;Functional tasks;Environmental controls;Internal/external aids;Medication managment;Oral motor exercises;Patient/family education;Speech/Language facilitation;Therapeutic  Exercise;Therapeutic Activities    Pain Pain Assessment Pain Assessment: No/denies pain Pain Score: 0-No pain Pain Type: Chronic pain Pain Location: Back (and left shoulder) Pain Descriptors / Indicators: Aching Pain Onset: On-going Pain Intervention(s): Medication (See eMAR) Prior Functioning Cognitive/Linguistic Baseline: Within functional limits Type of Home: Mobile home  Lives With: Spouse Available Help at Discharge: Family;Available 24 hours/day Education: HS + 2 yrs college Vocation: Retired  Industrial/product designer Term Goals: Week 1: SLP Short Term Goal 1 (Week 1): Pt will recall 2-3 new pieces of information given functional tasks with Supervision using visual/verbal cueing. SLP Short Term Goal 2 (Week 1): Pt will provide appropriate solutions to complex problem solving situations using functional tasks (i.e. money and/or medical management, etc) with Supervision using multimodal cueing. SLP Short Term Goal 3 (Week 1): Pt will display appropriate planning and organization skills using complex functional tasks (i.e. planning holiday meal, using time managment for busy day, etc) with min A  given mulitmodal cueing.  See FIM for current functional status Refer to Care Plan for Long Term Goals  Recommendations for other services: None  Discharge Criteria: Patient will be discharged from SLP if patient refuses treatment 3 consecutive times without medical reason, if treatment goals not met, if there is a change in medical status, if patient makes no progress towards goals or if patient is discharged from hospital.  The above assessment, treatment plan, treatment alternatives and goals were discussed and mutually agreed upon: by patient and by family  Annabell Howells, MA CCC-SLP Annabell Howells A 08/12/2014, 4:39 PM

## 2014-08-12 NOTE — Evaluation (Addendum)
Occupational Therapy Assessment and Plan  Patient Details  Name: [name] MRN: [id number] Date of Birth: December 01, 1940  OT Diagnosis: acute pain and hemiplegia affecting non-dominant side Rehab Potential: Rehab Potential: Good ELOS: 10-14 days   Today's Date: 08/12/2014 OT Individual Time: 1000-1100 OT Individual Time Calculation (min): 60 min     Problem List:  Patient Active Problem List   Diagnosis Date Noted  . Acute right arterial ischemic stroke, middle cerebral artery (MCA) 08/11/2014  . Carotid artery stenosis, symptomatic 08/08/2014  . Carotid disease, bilateral 06/02/2014  . Coronary artery disease due to lipid rich plaque 06/02/2014  . Essential hypertension 06/02/2014  . Hyperlipidemia 06/02/2014  . Acute respiratory failure   . S/P colostomy takedown 01/24/2014  . Chronic diastolic CHF (congestive heart failure), NYHA class 2 11/10/2013  . Atypical chest pain 10/20/2013  . Malnutrition of moderate degree 09/18/2013  . SBO (small bowel obstruction) 09/17/2013  . H/O tracheostomy 09/17/2013  . Acute respiratory failure with hypoxia s/p tracheostomy 08/19/2013  . Physical deconditioning 08/19/2013  . HTN (hypertension) 08/19/2013  . Dehydration with hypernatremia 08/19/2013  . DVT of axillary vein, acute right 08/19/2013  . Anemia 08/19/2013  . Anasarca 08/14/2013  . Hypokalemia 07/31/2013  . Hypomagnesemia 07/31/2013  . Protein-calorie malnutrition, severe w/ electrolyte imbalance 07/31/2013  . Colonic obstruction due to suspected colitis; s/p colectomy/colostomy 07/29/2013  . CAD (coronary artery disease)   . Depression   . Barrett's esophagus   . Arthritis   . Other and unspecified angina pectoris 12/20/2012  . Chest pain 12/14/2012  . TOBACCO ABUSE 11/08/2009  . Hyperlipidemia, mixed 04/17/2008  . Carotid artery disease 04/17/2008    Past Medical History:  Past Medical History  Diagnosis Date  . Carotid artery disease     a. 60-70% bilat ICA  stenosis by dopplers 05/2012.  Marland Kitchen Other and unspecified hyperlipidemia     takes Lipitor daily  . CAD (coronary artery disease)     a. Mod dz 2010 initially mgd medically. b. 12/2012 - angina s/p PTCA/DES to mid-circumflex, PTCA/DES to first OM.   Marland Kitchen Barrett's esophagus   . SBO (small bowel obstruction)     a. Lysis of adhesions and ovarian cystectomy 04/2013 for sBO. b. Admitted 07/2013 for colonic obstruction due to suspected colitis, s/p partial colectomy/colostomy 08/01/13. Admission complicated by anasarca, acute resp failure requiring tracheostomy, decannulated 08/25/13. c. Recurrent SBO8/2015, NGT placed for decompression.   . Colonic obstruction due to suspected colitis; s/p colectomy/colostomy     a. See SBO.  Marland Kitchen Protein-calorie malnutrition, severe w/ electrolyte imbalance     a. Severe hypoalbuminemia leading to 3rd spacing including anasarca and pulm edema 07/2013.  Marland Kitchen DVT of axillary vein, acute right     a. Dx 07/2013 felt due to R IJ central line that had been inserted 7/14. Not on anticoag due to GIB issues and small cerebral bleed.  . Acute respiratory failure with hypoxia s/p tracheostomy 07/2013  . Intracranial hemorrhage     a. 08/11/13 - per DC summary, tiny SAH vs SDH done for altered mentation, anticoag stopped including aspirin.  . Anemia     a. 7-08/2013 felt due to recent critical illness/chronic disease.  . Complication of anesthesia     hard to wake up from anesthesia   . Hypertension     takes Metoprolol daily  . HTN (hypertension)   . Heart murmur   . COPD (chronic obstructive pulmonary disease)   . Pneumonia     hx of->15  yrs ago  . History of bronchitis     > 96yrs ago   . Dizziness     was on Bentyl which caused this-took off of it and no problems since  . Stroke early 80's    right sided weakness--TIA  . Joint pain   . Chronic back pain     deteriorating;DDD  . Neck pain     DDD  . History of hiatal hernia   . History of colon polyps   . History of kidney  stones     was told it was stable but doesn't know for sure that she ever passed it  . Depression     takes Remeron and Zoloft daily  . GERD (gastroesophageal reflux disease)   . Arthritis     handds, knees & back    Past Surgical History:  Past Surgical History  Procedure Laterality Date  . Appendectomy    . Cholecystectomy    . Colon resection    . 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 Rotator cuff repair Left   . Abdominal hysterectomy    . Colon surgery    . Foot surgery Left   . Hand surgery Bilateral     for ganglion cysts  . Laparotomy N/A 08/01/2013    Procedure: EXPLORATORY LAPAROTOMY;  Surgeon: Harl Bowie, MD;  Location: [place];  Service: General;  Laterality: N/A;  . Partial colectomy N/A 08/01/2013    Procedure: PARTIAL COLECTOMY;  Surgeon: Harl Bowie, MD;  Location: [place];  Service: General;  Laterality: N/A;  . Colostomy N/A 08/01/2013    Procedure: COLOSTOMY;  Surgeon: Harl Bowie, MD;  Location: [place];  Service: General;  Laterality: N/A;  . Bowel resection N/A 08/01/2013    Procedure: SMALL BOWEL RESECTION;  Surgeon: Harl Bowie, MD;  Location: [place];  Service: General;  Laterality: N/A;  . Tracheostomy      feinstein  . Left heart catheterization with coronary angiogram N/A 12/20/2012    Procedure: LEFT HEART CATHETERIZATION WITH CORONARY ANGIOGRAM;  Surgeon: Blane Ohara, MD;  Location: [facility] CATH LAB;  Service: Cardiovascular;  Laterality: N/A;  . Cataract surgery Bilateral   . Colostomy takedown  01/24/2014    dr Ninfa Linden  . Colostomy takedown N/A 01/24/2014    Procedure: COLOSTOMY TAKEDOWN;  Surgeon: Coralie Keens, MD;  Location: [place];  Service: General;  Laterality: N/A;  . Tonsillectomy    . Endarterectomy Right 08/08/2014    Procedure: RIGHT CAROTID ENDARTERECTOMY WITH HEMASHIELD PATCH ANGIOPLASTY;  Surgeon: Elam Dutch, MD;  Location: [facility] OR;  Service: Vascular;   Laterality: Right;    Assessment & Plan Clinical Impression: Patient is a 74 y.o. female with history of CAD, HTN, CAS, 2-3 weeks history of intermittent LUE numbness and weakness who was admitted on 08/08/14 for R-CEA by Dr. Oneida Alar. Post procedure she developed left sided weakness and stat carotid duplex showed patent cervical ICA. CT head questioned acute R-MCA infarct and cerebral angiogram showed no occlusions or filling defects. MRI brain done revealing acute right parietal and posterior frontal lobe infarcts. 2D echo revealed EF 65-70% and no wall abnormality. Neurology felt that patient with thromboembolic stroke post op and to continue ASA 162 mg daily. Patient with resultant mild dysarthria, left sided weakness, higher level cognitive deficits. Has been weaned off dopamine and ABLA being monitored.  Patient transferred  to CIR on 08/11/2014 .    Patient currently requires mod with basic self-care skills secondary to abnormal tone and decreased motor planning.  Prior to hospitalization, patient could complete BADL/iADL with independent .  Patient will benefit from skilled intervention to increase independence with basic self-care skills prior to discharge home with care partner.  Anticipate patient will require minimal physical assistance and follow up home health.  OT - End of Session Activity Tolerance: Tolerates 30+ min activity with multiple rests Endurance Deficit: Yes Endurance Deficit Description: Fatigues quickly, requires frequent rest breaks OT Assessment Rehab Potential (ACUTE ONLY): Good OT Patient demonstrates impairments in the following area(s): Endurance;Safety;Motor OT Basic ADL's Functional Problem(s): Eating;Grooming;Bathing;Dressing;Toileting OT Advanced ADL's Functional Problem(s): Simple Meal Preparation OT Transfers Functional Problem(s): Toilet;Tub/Shower OT Additional Impairment(s): Fuctional Use of Upper Extremity OT Plan OT Intensity: Minimum of 1-2 x/day, 45  to 90 minutes OT Frequency: 5 out of 7 days OT Duration/Estimated Length of Stay: 10-14 days OT Treatment/Interventions: DME/adaptive equipment instruction;UE/LE Strength taining/ROM;Therapeutic Exercise;Therapeutic Activities;Self Care/advanced ADL retraining;UE/LE Coordination activities;Visual/perceptual remediation/compensation;Functional mobility training;Functional electrical stimulation;Neuromuscular re-education;Discharge planning;Patient/family education;Balance/vestibular training OT Self Feeding Anticipated Outcome(s): Mod I OT Basic Self-Care Anticipated Outcome(s): Min A OT Toileting Anticipated Outcome(s): Mod I OT Bathroom Transfers Anticipated Outcome(s): Supervision OT Recommendation Recommendations for Other Services: Other (comment) (Visual assessment) Patient destination: Home Follow Up Recommendations: Outpatient OT Equipment Recommended: To be determined   Skilled Therapeutic Intervention OT 1:1 initial evaluation completed with treatment provided to emphasize NMR of LUE, endurance, transfers, safety awareness, and pt/family education on goals and methods treatment.   Pt able to perform bathing at sink with setup and overall mod assist and extra time d/t fatigue and pain with hypersensitivity at right hand d/t IV access port.     OT Evaluation Precautions/Restrictions  Precautions Precautions: Fall Restrictions Weight Bearing Restrictions: No  General Chart Reviewed: Yes Family/Caregiver Present: Yes ([place])  Vital Signs Therapy Vitals BP: 140/62 mmHg  Pain Pain Assessment Pain Assessment: 0-10  Home Living/Prior Functioning Home Living Family/patient expects to be discharged to:: Private residence Living Arrangements: Spouse/significant other, Children Available Help at Discharge: Family, Available 24 hours/day Type of Home: Mobile home Home Access: Stairs to enter [employer] of Steps: @, 2 foot wide platforms, 4" step up each Entrance  Stairs-Rails: None Home Layout: One level Bathroom Shower/Tub: Multimedia programmer: Standard Bathroom Accessibility: Yes Additional Comments: Spouse supportive   Lives With: Spouse IADL History Homemaking Responsibilities: Yes Meal Prep Responsibility: Primary Laundry Responsibility: Secondary Cleaning Responsibility: Primary Bill Paying/Finance Responsibility: Primary Shopping Responsibility: Secondary Current License: Yes Mode of Transportation:  (Truck) Education: HS + 2 yrs college Occupation: Retired Type of Occupation: Regulatory affairs officer (2005), bookeeper Leisure and Hobbies: Best boy, country (square dance) Prior Function Level of Independence: Independent with gait, Independent with transfers  Able to Take Stairs?: Yes Driving: Yes Vocation: Retired Biomedical scientist: Worked at [employer] at [employer] 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; 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 Comments: Pt and spouse report that she was very independent PTA including household management and driving   ADL ADL ADL Comments: see FIM   Vision/Perception  Vision- History Baseline Vision/History: Wears glasses Wears Glasses: At all times Patient Visual Report: Blurring of vision;Eye fatigue/eye pain/headache (possible right eye weakness) Vision- Assessment Vision Assessment?: Vision impaired- to be further tested in functional context Eye Alignment: Impaired (comment) Tracking/Visual Pursuits: Impaired - to be further tested in functional context Visual Fields: Impaired-to be further tested in functional context   Cognition Overall Cognitive Status:  Impaired/Different from baseline Arousal/Alertness: Awake/alert Orientation Level: Person;Place;Situation Person: Oriented Place: Oriented Situation: Oriented Year: 2016 Month: July Day of Week: Correct Memory: Appears intact Immediate Memory Recall: Sock;Blue;Bed Memory Recall: Sock;Blue;Bed Memory Recall Sock: Without Cue Memory Recall Blue: Without  Cue Memory Recall Bed: Without Cue Attention: Sustained Focused Attention: Appears intact Sustained Attention: Impaired Sustained Attention Impairment: Verbal complex;Functional complex Awareness: Appears intact Problem Solving: Impaired Problem Solving Impairment: Verbal complex;Functional complex Reasoning: Appears intact Behaviors: Impulsive Safety/Judgment: Appears intact  Sensation Sensation Light Touch: Impaired Detail Light Touch Impaired Details: Impaired LLE;Absent LLE Additional Comments: Grossly impaired at LUE Coordination Gross Motor Movements are Fluid and Coordinated: No Fine Motor Movements are Fluid and Coordinated: No Coordination and Movement Description: L-hemiparesis  Motor  Motor Motor: Hemiplegia;Abnormal postural alignment and control;Motor impersistence Motor - Skilled Clinical Observations: Mild hemiplegia L LE, more dense hemiplegia L UE  Mobility  Bed Mobility Bed Mobility: Supine to Sit;Sit to Supine Supine to Sit: 3: Mod assist;HOB flat Supine to Sit Details: Tactile cues for sequencing;Verbal cues for precautions/safety;Verbal cues for technique;Verbal cues for sequencing;Manual facilitation for weight shifting Supine to Sit Details (indicate cue type and reason): difficulty due to L UE lagging behind Sit to Supine: 4: Min guard;HOB flat Sit to Supine - Details: Tactile cues for sequencing;Verbal cues for precautions/safety;Verbal cues for technique;Verbal cues for sequencing;Manual facilitation for weight shifting Sit to Supine - Details (indicate cue type and reason): For L UE management Transfers Sit to Stand: 4: Min assist;From chair/3-in-1;From bed;With armrests;With upper extremity assist Sit to Stand Details: Verbal cues for sequencing;Manual facilitation for weight shifting;Tactile cues for sequencing;Verbal cues for precautions/safety;Verbal cues for technique Stand to Sit: 4: Min guard;With upper extremity assist;With armrests;To  chair/3-in-1;To bed   Trunk/Postural Assessment  Cervical Assessment Cervical Assessment: Exceptions to [facility] Thoracic Assessment Thoracic Assessment: Within Functional Limits Postural Control Postural Control: Deficits on evaluation Righting Reactions: delayed Protective Responses: delayed Postural Limitations: posterior pelvic tilt in sitting   Balance Balance Balance Assessed: Yes Standardized Balance Assessment Standardized Balance Assessment: Berg Balance Test Berg Balance Test Sit to Stand: Needs minimal aid to stand or to stabilize Standing Unsupported: Able to stand 30 seconds unsupported Sitting with Back Unsupported but Feet Supported on Floor or Stool: Able to sit 2 minutes under supervision Stand to Sit: Uses backs of legs against chair to control descent Transfers: Needs one person to assist Standing Unsupported with Eyes Closed: Unable to keep eyes closed 3 seconds but stays steady Standing Ubsupported with Feet Together: Needs help to attain position and unable to hold for 15 seconds From Standing, Reach Forward with Outstretched Arm: Loses balance while trying/requires external support From Standing Position, Pick up Object from Floor: Unable to try/needs assist to keep balance From Standing Position, Turn to Look Behind Over each Shoulder: Needs assist to keep from losing balance and falling Turn 360 Degrees: Needs assistance while turning Standing Unsupported, Alternately Place Feet on Step/Stool: Needs assistance to keep from falling or unable to try Standing Unsupported, One Foot in Front: Loses balance while stepping or standing Standing on One Leg: Unable to try or needs assist to prevent fall Total Score: 10 Static Sitting Balance Static Sitting - Balance Support: No upper extremity supported;Feet supported;Feet unsupported Static Sitting - Level of Assistance: 5: Stand by assistance Static Standing Balance Static Standing - Balance Support: Right upper  extremity supported;During functional activity Static Standing - Level of Assistance: 4: Min assist  Extremity/Trunk Assessment RUE Assessment RUE Assessment: Within Functional Limits LUE Assessment  LUE Assessment: Exceptions to [facility] LUE Strength Left Shoulder Flexion: 2/5 Left Elbow Flexion: 2-/5 Left Elbow Extension: 2+/5 Left Wrist Extension: 2-/5  FIM:  FIM - Bathing Bathing Steps Patient Completed: Chest;Left Arm;Front perineal area;Abdomen;Buttocks;Right upper leg;Left upper leg Bathing: 3: Mod-Patient completes 5-7 68f 10 parts or 50-74% FIM - Upper Body Dressing/Undressing Upper body dressing/undressing steps patient completed: Thread/unthread right sleeve of pullover shirt/dresss;Put head through opening of pull over shirt/dress Upper body dressing/undressing: 3: Mod-Patient completed 50-74% of tasks FIM - Lower Body Dressing/Undressing Lower body dressing/undressing steps patient completed: Thread/unthread right underwear leg;Thread/unthread left underwear leg;Thread/unthread right pants leg;Thread/unthread left pants leg Lower body dressing/undressing: 2: Max-Patient completed 25-49% of tasks FIM - Control and instrumentation engineer Devices: Bed rails Bed/Chair Transfer: 4: Supine > Sit: Min A (steadying Pt. > 75%/lift 1 leg);4: Bed > Chair or W/C: Min A (steadying Pt. > 75%)   Refer to Care Plan for Long Term Goals  Recommendations for other services: Other: Visual assessment  Discharge Criteria: Patient will be discharged from OT if patient refuses treatment 3 consecutive times without medical reason, if treatment goals not met, if there is a change in medical status, if patient makes no progress towards goals or if patient is discharged from hospital.  The above assessment, treatment plan, treatment alternatives and goals were discussed and mutually agreed upon: by patient and by family  [facility] 08/12/2014, 12:58 PM

## 2014-08-12 NOTE — Evaluation (Signed)
Physical Therapy Assessment and Plan  Patient Details  Name: [name] MRN: [id number] Date of Birth: Sep 20, 1940  PT Diagnosis: Abnormal posture, Abnormality of gait, Cognitive deficits, Coordination disorder, Hemiplegia non-dominant, Impaired cognition, Impaired sensation, Low back pain, Muscle weakness and Pain in low back Rehab Potential: Good ELOS: 12-14 days   Today's Date: 08/12/2014 PT Individual Time: 0800-0900 PT Individual Time Calculation (min): 60 min    Problem List:  Patient Active Problem List   Diagnosis Date Noted  . Acute right arterial ischemic stroke, middle cerebral artery (MCA) 08/11/2014  . Carotid artery stenosis, symptomatic 08/08/2014  . Carotid disease, bilateral 06/02/2014  . Coronary artery disease due to lipid rich plaque 06/02/2014  . Essential hypertension 06/02/2014  . Hyperlipidemia 06/02/2014  . Acute respiratory failure   . S/P colostomy takedown 01/24/2014  . Chronic diastolic CHF (congestive heart failure), NYHA class 2 11/10/2013  . Atypical chest pain 10/20/2013  . Malnutrition of moderate degree 09/18/2013  . SBO (small bowel obstruction) 09/17/2013  . H/O tracheostomy 09/17/2013  . Acute respiratory failure with hypoxia s/p tracheostomy 08/19/2013  . Physical deconditioning 08/19/2013  . HTN (hypertension) 08/19/2013  . Dehydration with hypernatremia 08/19/2013  . DVT of axillary vein, acute right 08/19/2013  . Anemia 08/19/2013  . Anasarca 08/14/2013  . Hypokalemia 07/31/2013  . Hypomagnesemia 07/31/2013  . Protein-calorie malnutrition, severe w/ electrolyte imbalance 07/31/2013  . Colonic obstruction due to suspected colitis; s/p colectomy/colostomy 07/29/2013  . CAD (coronary artery disease)   . Depression   . Barrett's esophagus   . Arthritis   . Other and unspecified angina pectoris 12/20/2012  . Chest pain 12/14/2012  . TOBACCO ABUSE 11/08/2009  . Hyperlipidemia, mixed 04/17/2008  . Carotid artery disease  04/17/2008    Past Medical History:  Past Medical History  Diagnosis Date  . Carotid artery disease     a. 60-70% bilat ICA stenosis by dopplers 05/2012.  Marland Kitchen Other and unspecified hyperlipidemia     takes Lipitor daily  . CAD (coronary artery disease)     a. Mod dz 2010 initially mgd medically. b. 12/2012 - angina s/p PTCA/DES to mid-circumflex, PTCA/DES to first OM.   Marland Kitchen Barrett's esophagus   . SBO (small bowel obstruction)     a. Lysis of adhesions and ovarian cystectomy 04/2013 for sBO. b. Admitted 07/2013 for colonic obstruction due to suspected colitis, s/p partial colectomy/colostomy 08/01/13. Admission complicated by anasarca, acute resp failure requiring tracheostomy, decannulated 08/25/13. c. Recurrent SBO8/2015, NGT placed for decompression.   . Colonic obstruction due to suspected colitis; s/p colectomy/colostomy     a. See SBO.  Marland Kitchen Protein-calorie malnutrition, severe w/ electrolyte imbalance     a. Severe hypoalbuminemia leading to 3rd spacing including anasarca and pulm edema 07/2013.  Marland Kitchen DVT of axillary vein, acute right     a. Dx 07/2013 felt due to R IJ central line that had been inserted 7/14. Not on anticoag due to GIB issues and small cerebral bleed.  . Acute respiratory failure with hypoxia s/p tracheostomy 07/2013  . Intracranial hemorrhage     a. 08/11/13 - per DC summary, tiny SAH vs SDH done for altered mentation, anticoag stopped including aspirin.  . Anemia     a. 7-08/2013 felt due to recent critical illness/chronic disease.  . Complication of anesthesia     hard to wake up from anesthesia   . Hypertension     takes Metoprolol daily  . HTN (hypertension)   . Heart murmur   .  COPD (chronic obstructive pulmonary disease)   . Pneumonia     hx of->15 yrs ago  . History of bronchitis     > 27yrs ago   . Dizziness     was on Bentyl which caused this-took off of it and no problems since  . Stroke early 80's    right sided weakness--TIA  . Joint pain   . Chronic back  pain     deteriorating;DDD  . Neck pain     DDD  . History of hiatal hernia   . History of colon polyps   . History of kidney stones     was told it was stable but doesn't know for sure that she ever passed it  . Depression     takes Remeron and Zoloft daily  . GERD (gastroesophageal reflux disease)   . Arthritis     handds, knees & back    Past Surgical History:  Past Surgical History  Procedure Laterality Date  . Appendectomy    . Cholecystectomy    . Colon resection    . 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 Rotator cuff repair Left   . Abdominal hysterectomy    . Colon surgery    . Foot surgery Left   . Hand surgery Bilateral     for ganglion cysts  . Laparotomy N/A 08/01/2013    Procedure: EXPLORATORY LAPAROTOMY;  Surgeon: Harl Bowie, MD;  Location: [place];  Service: General;  Laterality: N/A;  . Partial colectomy N/A 08/01/2013    Procedure: PARTIAL COLECTOMY;  Surgeon: Harl Bowie, MD;  Location: [place];  Service: General;  Laterality: N/A;  . Colostomy N/A 08/01/2013    Procedure: COLOSTOMY;  Surgeon: Harl Bowie, MD;  Location: [place];  Service: General;  Laterality: N/A;  . Bowel resection N/A 08/01/2013    Procedure: SMALL BOWEL RESECTION;  Surgeon: Harl Bowie, MD;  Location: [place];  Service: General;  Laterality: N/A;  . Tracheostomy      feinstein  . Left heart catheterization with coronary angiogram N/A 12/20/2012    Procedure: LEFT HEART CATHETERIZATION WITH CORONARY ANGIOGRAM;  Surgeon: Blane Ohara, MD;  Location: [facility] CATH LAB;  Service: Cardiovascular;  Laterality: N/A;  . Cataract surgery Bilateral   . Colostomy takedown  01/24/2014    dr Ninfa Linden  . Colostomy takedown N/A 01/24/2014    Procedure: COLOSTOMY TAKEDOWN;  Surgeon: Coralie Keens, MD;  Location: [place];  Service: General;  Laterality: N/A;  . Tonsillectomy    . Endarterectomy Right 08/08/2014    Procedure: RIGHT  CAROTID ENDARTERECTOMY WITH HEMASHIELD PATCH ANGIOPLASTY;  Surgeon: Elam Dutch, MD;  Location: [facility] OR;  Service: Vascular;  Laterality: Right;    Assessment & Plan Clinical Impression: [name] is a 74 y.o. female with history of CAD, HTN, CAS, 2-3 weeks history of intermittent LUE numbness and weakness who was admitted on 08/08/14 for R-CEA by Dr. Oneida Alar. Post procedure she developed left sided weakness and stat carotid duplex showed patent cervical ICA. CT head questioned acute R-MCA infarct and cerebral angiogram showed no occlusions or filling defects. MRI brain done revealing acute right parietal and posterior frontal lobe infarcts. 2D echo revealed EF 65-70% and no wall abnormality. Neurology felt that patient with thromboembolic stroke post op and to continue ASA 162 mg daily. Patient with resultant mild dysarthria, left sided  weakness, higher level cognitive deficits. Has been weaned off dopamine and ABLA being monitored. CIR was recommended for follow up therapyPatient transferred to CIR on 08/11/2014 .   Patient currently requires mod with mobility secondary to muscle weakness, decreased cardiorespiratoy endurance, impaired timing and sequencing, abnormal tone and decreased coordination, decreased visual perceptual skills, decreased attention to left, decreased problem solving and decreased standing balance, decreased postural control, hemiplegia and decreased balance strategies.  Prior to hospitalization, patient was independent  with mobility and lived with Spouse in a Mobile home home.  Home access is @, 2 foot wide platforms, 4" step up eachStairs to enter.  Patient will benefit from skilled PT intervention to maximize safe functional mobility, minimize fall risk and decrease caregiver burden for planned discharge home with intermittent assist.  Anticipate patient will benefit from follow up [facility] at discharge.  PT - End of Session Activity Tolerance: Tolerates 30+ min activity  with multiple rests Endurance Deficit: Yes Endurance Deficit Description: Fatigues quickly, requires frequent rest breaks PT Assessment Rehab Potential (ACUTE/IP ONLY): Good Barriers to Discharge: [place] home environment (may need handrails; family can install) PT Patient demonstrates impairments in the following area(s): Balance;Endurance;Motor;Pain;Perception;Safety;Sensory PT Transfers Functional Problem(s): Bed Mobility;Car;Bed to Chair;Furniture PT Locomotion Functional Problem(s): Ambulation;Wheelchair Mobility;Stairs PT Plan PT Intensity: Minimum of 1-2 x/day ,45 to 90 minutes PT Frequency: 5 out of 7 days PT Duration Estimated Length of Stay: 12-14 days PT Treatment/Interventions: Ambulation/gait training;Pain management;Stair training;Visual/perceptual remediation/compensation;Wheelchair propulsion/positioning;Therapeutic Activities;Patient/family education;DME/adaptive equipment instruction;Balance/vestibular training;Cognitive remediation/compensation;Psychosocial support;Therapeutic Exercise;UE/LE Strength taining/ROM;Functional mobility training;Community reintegration;Discharge planning;Neuromuscular re-education;Splinting/orthotics;UE/LE Coordination activities PT Transfers Anticipated Outcome(s): mod I PT Locomotion Anticipated Outcome(s): supervision ambulation PT Recommendation Recommendations for Other Services: Speech consult Follow Up Recommendations: Home health PT Patient destination: Home Equipment Recommended: To be determined Equipment Details: Patient owns RW, rollator, [facility], wheelchair, [facility], shower chair  Skilled Therapeutic Intervention Patient seen for initial PT evaluation. Discussed falls risk, safety within room, and focus of therapy during stay. Discussed LOS, goals, and f/u therapy; patient verbalized understanding, but will likely require reinforcement. Skilled therapeutic intervention initiated after completion of evaluation. Added L UE half lap tray  and standard leg rests in shortest position (may need additional LE support). Patient returned to room and left sitting in wheelchair with husband present and all needs within reach.  PT Evaluation Precautions/Restrictions Precautions Precautions: Fall Restrictions Weight Bearing Restrictions: No General Chart Reviewed: Yes Family/Caregiver Present: Yes  Vital SignsTherapy Vitals Pulse Rate: 75 BP: (!) 104/42 mmHg (Patient asymptomatic) Patient Position (if appropriate): Lying Oxygen Therapy SpO2: 94 % O2 Device: Not Delivered Pain Pain Assessment Pain Assessment: 0-10 Pain Score: 7  Pain Type: Chronic pain Pain Location: Back Pain Orientation: Lower Pain Descriptors / Indicators: Aching;Sore Pain Onset: Gradual Pain Intervention(s): RN made aware;Repositioned;Ambulation/increased activity;Rest Multiple Pain Sites: No Home Living/Prior Functioning Home Living Available Help at Discharge: Family;Available 24 hours/day Type of Home: Mobile home Home Access: Stairs to enter Entrance Stairs-Number of Steps: @, 2 foot wide platforms, 4" step up each Entrance Stairs-Rails: None Home Layout: One level Bathroom Shower/Tub: Multimedia programmer: Standard Bathroom Accessibility: Yes Additional Comments: Spouse supportive   Lives With: Spouse Prior Function Level of Independence: Independent with gait;Independent with transfers  Able to Take Stairs?: Yes Driving: Yes 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 Cognition Overall Cognitive Status: Impaired/Different from baseline Arousal/Alertness: Awake/alert Orientation  Level: Oriented X4 Attention: Focused;Sustained Focused Attention: Appears intact Sustained Attention: Impaired Sustained Attention Impairment: Verbal complex;Functional complex Memory: Impaired Memory Impairment: Decreased recall of new information;Retrieval deficit Awareness: Appears intact Problem Solving: Impaired Problem Solving Impairment: Verbal complex;Functional complex Executive Function: Reasoning Reasoning: Impaired Reasoning Impairment: Verbal complex;Functional complex Safety/Judgment: Appears intact Sensation Sensation Light Touch: Impaired Detail Light Touch Impaired Details: Impaired LLE;Absent LLE Proprioception: Appears Intact Additional Comments: Proprioception intact at B ankles and great toes Coordination Gross Motor Movements are Fluid and Coordinated: No Fine Motor Movements are Fluid and Coordinated: No Heel Shin Test: Moderate dysmetria noted on L LE, impacted by strength deficits Motor  Motor Motor: Hemiplegia;Abnormal postural alignment and control;Motor impersistence Motor - Skilled Clinical Observations: Mild hemiplegia L LE, more dense hemiplegia L UE  Mobility Bed Mobility Bed Mobility: Supine to Sit;Sit to Supine Supine to Sit: 3: Mod assist;HOB flat Supine to Sit Details: Tactile cues for sequencing;Verbal cues for precautions/safety;Verbal cues for technique;Verbal cues for sequencing;Manual facilitation for weight shifting Supine to Sit Details (indicate cue type and reason): difficulty due to L UE lagging behind Sit to Supine: 4: Min guard;HOB flat Sit to Supine - Details: Tactile cues for sequencing;Verbal cues for precautions/safety;Verbal cues for technique;Verbal cues for sequencing;Manual facilitation for weight shifting Sit to Supine - Details (indicate cue type and reason): For L UE management Transfers Transfers: Yes Sit to Stand: 4: Min assist;From chair/3-in-1;From bed;With armrests;With upper extremity assist Sit to Stand  Details: Verbal cues for sequencing;Manual facilitation for weight shifting;Tactile cues for sequencing;Verbal cues for precautions/safety;Verbal cues for technique Stand to Sit: 4: Min guard;With upper extremity assist;With armrests;To chair/3-in-1;To bed Stand Pivot Transfers: 4: Min assist;With armrests Stand Pivot Transfer Details: Verbal cues for sequencing;Manual facilitation for weight shifting;Tactile cues for sequencing;Verbal cues for precautions/safety;Verbal cues for technique;Tactile cues for weight shifting Locomotion  Ambulation Ambulation: Yes Ambulation/Gait Assistance: 4: Min assist;3: Mod assist Ambulation Distance (Feet): 45 Feet Assistive device: 1 person hand held assist (R HHA) Ambulation/Gait Assistance Details: Verbal cues for gait pattern;Tactile cues for weight shifting;Manual facilitation for weight shifting;Verbal cues for precautions/safety;Tactile cues for posture Ambulation/Gait Assistance Details: Patient performed gait training 12' x1 with R HHA and min-modA, modA required due to increased trunk flexion with fatigue.  Gait Gait: Yes Gait Pattern: Impaired Gait Pattern: Step-through pattern;Decreased step length - right;Decreased step length - left;Decreased stance time - left;Decreased stride length;Trunk flexed;Narrow base of support;Shuffle Stairs / Additional Locomotion Stairs: Yes Stairs Assistance: 3: Mod assist Stairs Assistance Details: Manual facilitation for weight shifting;Tactile cues for sequencing;Tactile cues for posture;Verbal cues for sequencing;Verbal cues for technique;Verbal cues for precautions/safety;Visual cues for safe use of DME/AE Stair Management Technique: One rail Right;Step to pattern;Forwards Number of Stairs: 4 Height of Stairs: 6 Ramp: Not tested (comment) Curb: Not tested (comment) Wheelchair Mobility Wheelchair Mobility: Yes Wheelchair Assistance: 1: +1 Total assist Wheelchair Parts Management: Needs assistance Distance:  150; Unable to use L UE and B LEs do not reach floor.  Trunk/Postural Assessment  Cervical Assessment Cervical Assessment: Exceptions to [facility] (forward head posture) Thoracic Assessment Thoracic Assessment: Within Functional Limits Lumbar Assessment Lumbar Assessment: Within Functional Limits Postural Control Postural Control: Deficits on evaluation Righting Reactions: delayed Protective Responses: delayed Postural Limitations: posterior pelvic tilt in sitting  Balance Balance Balance Assessed: Yes Standardized Balance Assessment Standardized Balance Assessment: Berg Balance Test Berg Balance Test Sit to  Stand: Needs minimal aid to stand or to stabilize Standing Unsupported: Able to stand 30 seconds unsupported Sitting with Back Unsupported but Feet Supported on Floor or Stool: Able to sit 2 minutes under supervision Stand to Sit: Uses backs of legs against chair to control descent Transfers: Needs one person to assist Standing Unsupported with Eyes Closed: Unable to keep eyes closed 3 seconds but stays steady Standing Ubsupported with Feet Together: Needs help to attain position and unable to hold for 15 seconds From Standing, Reach Forward with Outstretched Arm: Loses balance while trying/requires external support From Standing Position, Pick up Object from Floor: Unable to try/needs assist to keep balance From Standing Position, Turn to Look Behind Over each Shoulder: Needs assist to keep from losing balance and falling Turn 360 Degrees: Needs assistance while turning Standing Unsupported, Alternately Place Feet on Step/Stool: Needs assistance to keep from falling or unable to try Standing Unsupported, One Foot in Front: Loses balance while stepping or standing Standing on One Leg: Unable to try or needs assist to prevent fall Total Score: 10/56 indicating patient is at high risk for falls Static Sitting Balance Static Sitting - Balance Support: No upper extremity supported;Feet  supported;Feet unsupported Static Sitting - Level of Assistance: 5: Stand by assistance Static Standing Balance Static Standing - Balance Support: Right upper extremity supported;During functional activity Static Standing - Level of Assistance: 4: Min assist Extremity Assessment  RLE Assessment RLE Assessment: Within Functional Limits (Grossly 4/5 ) LLE Assessment LLE Assessment: Exceptions to [facility] LLE Strength LLE Overall Strength: Deficits LLE Overall Strength Comments: Very mild hemiplegia; 3+/5 hip flex, 4+/5 knee ex, 4-/5 knee flex, 4/5 ankle  FIM:  FIM - Bed/Chair Transfer Bed/Chair Transfer Assistive Devices: Bed rails;HOB elevated;Arm rests Bed/Chair Transfer: 3: Supine > Sit: Mod A (lifting assist/Pt. 50-74%/lift 2 legs;4: Sit > Supine: Min A (steadying pt. > 75%/lift 1 leg);4: Bed > Chair or W/C: Min A (steadying Pt. > 75%);4: Chair or W/C > Bed: Min A (steadying Pt. > 75%) FIM - Locomotion: Wheelchair Distance: 150; Unable to use L UE and B LEs do not reach floor. Locomotion: Wheelchair: 1: Total Assistance/staff pushes wheelchair (Pt<25%) FIM - Locomotion: Ambulation Locomotion: Ambulation Assistive Devices: Other (comment) (R HHA) Ambulation/Gait Assistance: 4: Min assist;3: Mod assist Locomotion: Ambulation: 1: Travels less than 50 ft with moderate assistance (Pt: 50 - 74%) FIM - Locomotion: Stairs Locomotion: Scientist, physiological: Hand rail - 1 Locomotion: Stairs: 2: Up and Down 4 - 11 stairs with moderate assistance (Pt: 50 - 74%)   Refer to Care Plan for Long Term Goals  Recommendations for other services: None  Discharge Criteria: Patient will be discharged from PT if patient refuses treatment 3 consecutive times without medical reason, if treatment goals not met, if there is a change in medical status, if patient makes no progress towards goals or if patient is discharged from hospital.  The above assessment, treatment plan, treatment alternatives and goals  were discussed and mutually agreed upon: by patient and by family  Lillia Abed. Ciena Sampley, PT, DPT 08/12/2014, 11:17 AM

## 2014-08-12 NOTE — Progress Notes (Signed)
74 y.o. female with history of CAD, HTN, CAS, 2-3 weeks history of intermittent LUE numbness and weakness who was admitted on 08/08/14 for R-CEA by Dr. Oneida Alar. Post procedure she developed left sided weakness and stat carotid duplex showed patent cervical ICA. CT head questioned acute R-MCA infarct and cerebral angiogram showed no occlusions or filling defects. MRI brain done revealing acute right parietal and posterior frontal lobe infarcts.  2D echo revealed EF 65-70% and no wall abnormality. Neurology felt that patient with thromboembolic stroke post op and to continue ASA 162 mg daily  Subjective/Complaints: Had back pain last noc, chronic issues, no hsx of surgery , no pain radiating to LEs Review of Systems - no problem with bowels or bladder, no SOB Objective: Vital Signs: Blood pressure 104/42, pulse 75, temperature 98 F (36.7 C), temperature source Oral, resp. rate 18, weight 83 kg (182 lb 15.7 oz), SpO2 94 %. No results found. Results for orders placed or performed during the hospital encounter of 08/11/14 (from the past 72 hour(s))  Comprehensive metabolic panel     Status: Abnormal   Collection Time: 08/12/14  4:35 AM  Result Value Ref Range   Sodium 141 135 - 145 mmol/L   Potassium 3.5 3.5 - 5.1 mmol/L   Chloride 109 101 - 111 mmol/L   CO2 23 22 - 32 mmol/L   Glucose, Bld 110 (H) 65 - 99 mg/dL   BUN 16 6 - 20 mg/dL   Creatinine, Ser 0.60 0.44 - 1.00 mg/dL   Calcium 8.6 (L) 8.9 - 10.3 mg/dL   Total Protein 5.7 (L) 6.5 - 8.1 g/dL   Albumin 2.8 (L) 3.5 - 5.0 g/dL   AST 19 15 - 41 U/L   ALT 17 14 - 54 U/L   Alkaline Phosphatase 77 38 - 126 U/L   Total Bilirubin 0.7 0.3 - 1.2 mg/dL   GFR calc non Af Amer >60 >60 mL/min   GFR calc Af Amer >60 >60 mL/min    Comment: (NOTE) The eGFR has been calculated using the CKD EPI equation. This calculation has not been validated in all clinical situations. eGFR's persistently <60 mL/min signify possible Chronic Kidney Disease.    Anion gap 9 5 - 15     HEENT: Right CEA incision with swelling, dermabond Cardio: RRR and no murmur Resp: CTA B/L and unlabored GI: BS positive and NT, ND Extremity:  Pulses positive and No Edema Skin:   Wound C/D/I and mild erythema at borders Neuro: Alert/Oriented, Flat, Abnormal Sensory parasthesia Left hand but identifies LT, Abnormal Motor 2- Left triceps o/w 0/5 in LUE, LLE 4- HF, KE, ADF, right side 5/5, Abnormal FMC Ataxic/ dec FMC and Other no evidence of Left neglect Musc/Skel:  Other back pain with movement Gen NAD   Assessment/Plan: 1. Functional deficits secondary to Right MCA infarct which require 3+ hours per day of interdisciplinary therapy in a comprehensive inpatient rehab setting. Physiatrist is providing close team supervision and 24 hour management of active medical problems listed below. Physiatrist and rehab team continue to assess barriers to discharge/monitor patient progress toward functional and medical goals. FIM:                   Comprehension Comprehension Mode: Auditory Comprehension: 5-Understands complex 90% of the time/Cues < 10% of the time  Expression Expression Mode: Verbal Expression: 5-Expresses complex 90% of the time/cues < 10% of the time  Social Interaction Social Interaction: 5-Interacts appropriately 90% of the time - Needs monitoring  or encouragement for participation or interaction.  Problem Solving Problem Solving: 6-Solves complex problems: With extra time  Memory Memory: 5-Recognizes or recalls 90% of the time/requires cueing < 10% of the time  Medical Problem List and Plan: 1. Functional deficits secondary to Right MCA infarct after CEA with left hemiparesis and sensory loss 2.  DVT Prophylaxis/Anticoagulation: Pharmaceutical: Lovenox 3. Chronic back/knee pain/Pain Management: Will continue oxydone prn for pain management. Add Voltaren gel to left shoulder to help with shoulder pain as well as K pad for back pain.     4. H/o Depression/Mood:  Continue Zoloft and Remeron. Team to provide ego support. LCSW to follow for evaluation and support.   5. Neuropsych: This patient is capable of making decisions on her own behalf. 6. Skin/Wound Care:  Routine pressure relief measures.   7. Fluids/Electrolytes/Nutrition: Monitor I/O. Check lytes in am.   8. Hypotension: Monitor BP every 8 hours. On Toprol for HR control.   9. ABLA: Post op bleeding. will monitor for signs of bleeding. Check CBC in am.   10. R-CEA:  On Lipitor,  Lovaza and ASA 11. Barrett's esophagitis: Continue  protonix bid with zofran prn for intermittent nausea.    LOS (Days) 1 A FACE TO FACE EVALUATION WAS PERFORMED  Fotios Amos E 08/12/2014, 7:05 AM

## 2014-08-12 NOTE — Progress Notes (Signed)
Physical Therapy Session Note  Patient Details  Name: [name] MRN: [id number] Date of Birth: 1940/07/11  Today's Date: 08/12/2014 PT Individual Time: 1300-1330 PT Individual Time Calculation (min): 30 min   Short Term Goals: Week 1:  PT Short Term Goal 1 (Week 1): Patient will perform bed mobility with minA from flat surface. PT Short Term Goal 2 (Week 1): Patient will perform stand pivot transfers without AD and supervision. PT Short Term Goal 3 (Week 1): Patient will ambulate 100' without AD and minA. PT Short Term Goal 4 (Week 1): Patient will negotiate one curb step with appropriate AD/HHA with minA to simulate STE home.  Skilled Therapeutic Interventions/Progress Updates:    Patient received semi-reclined in bed. Session focused on functional transfers, wheelchair mobility for LE strengthening, and activity tolerance. Supine>sit with HOB elevated and use of bedrails and minA, stand pivot with minA without AD, cues for safe hand placement. Wheelchair mobility ~75' with B LEs for strengthening; minA for steering and obstacle negotiation. NuStep Level 4>>2 with B LEs x10' for activity tolerance. Patient returned to room and left sitting in wheelchair with all needs within reach.  Therapy Documentation Precautions:  Precautions Precautions: Fall Restrictions Weight Bearing Restrictions: No Pain: Pain Assessment Pain Assessment: No/denies pain Pain Score: 0-No pain   See FIM for current functional status  Therapy/Group: Individual Therapy  Lillia Abed. Caileigh Canche, PT, DPT 08/12/2014, 1:17 PM

## 2014-08-13 ENCOUNTER — Encounter (HOSPITAL_COMMUNITY): Payer: Commercial Managed Care - HMO

## 2014-08-13 DIAGNOSIS — G8194 Hemiplegia, unspecified affecting left nondominant side: Secondary | ICD-10-CM

## 2014-08-13 NOTE — Progress Notes (Signed)
Occupational Therapy Session Note  Patient Details  Name: [name] MRN: [id number] Date of Birth: December 23, 1940  Today's Date: 08/13/2014 OT Individual Time: 1100-1200 OT Individual Time Calculation (min): 60 min    Short Term Goals: Week 1:  OT Short Term Goal 1 (Week 1): Pt will complete upper body dressing with supervision OT Short Term Goal 2 (Week 1): Pt will bathe sitting and standing with min assist OT Short Term Goal 3 (Week 1): Pt will complete lower body dressing with mod assist OT Short Term Goal 4 (Week 1): Pt will demo ability to use LUE at diminished level during BADL OT Short Term Goal 5 (Week 1): Pt will demo ability to complete HEP for NMR of LUE with supervision  Skilled Therapeutic Interventions/Progress Updates:    Pt resting in bed upon arrival with husband present.  Pt already dressed and declined bathing and dressing this morning.  Pt transitioned to therapy gym and engaged in LUE NMR and therapeutic activities while seated EOM. Pt initially engaged in weight bearing through LUE (elbow and with elbow extended). Pt c/o slight stretching "pain" in elbow when extended.  Pt transitioned to sitting and supine activities/tasks with focus on isolated movement of LUE at shoulder and elbow.  Pt exhibits difficulty with isolated movements but displayed improvement with repetition.  Pt exhibits increased tone in biceps and fingers. Pt exhibits increased shoulder flexion, extension, and adduction, as well as elbow flexion and extension.  Focus on LUE NMR and increased use to increase independence with BADLs.  Therapy Documentation Precautions:  Precautions Precautions: Fall Restrictions Weight Bearing Restrictions: No Pain: Pain Assessment Pain Assessment: No/denies pain  See FIM for current functional status  Therapy/Group: Individual Therapy  Leroy Libman 08/13/2014, 12:23 PM

## 2014-08-13 NOTE — Progress Notes (Signed)
Subjective/Complaints: No issues overnite, eating well no swallowing problems, no breathing problems Review of Systems - no problem with bowels or bladder, no SOB Objective: Vital Signs: Blood pressure 143/54, pulse 81, temperature 98.2 F (36.8 C), temperature source Oral, resp. rate 16, weight 83 kg (182 lb 15.7 oz), SpO2 95 %. No results found. Results for orders placed or performed during the hospital encounter of 08/11/14 (from the past 72 hour(s))  CBC WITH DIFFERENTIAL     Status: Abnormal   Collection Time: 08/12/14  4:35 AM  Result Value Ref Range   WBC 6.3 4.0 - 10.5 K/uL   RBC 3.27 (L) 3.87 - 5.11 MIL/uL   Hemoglobin 8.5 (L) 12.0 - 15.0 g/dL   HCT 26.0 (L) 36.0 - 46.0 %   MCV 79.5 78.0 - 100.0 fL   MCH 26.0 26.0 - 34.0 pg   MCHC 32.7 30.0 - 36.0 g/dL   RDW 16.9 (H) 11.5 - 15.5 %   Platelets 171 150 - 400 K/uL   Neutrophils Relative % 54 43 - 77 %   Neutro Abs 3.4 1.7 - 7.7 K/uL   Lymphocytes Relative 33 12 - 46 %   Lymphs Abs 2.0 0.7 - 4.0 K/uL   Monocytes Relative 8 3 - 12 %   Monocytes Absolute 0.5 0.1 - 1.0 K/uL   Eosinophils Relative 5 0 - 5 %   Eosinophils Absolute 0.3 0.0 - 0.7 K/uL   Basophils Relative 0 0 - 1 %   Basophils Absolute 0.0 0.0 - 0.1 K/uL  Comprehensive metabolic panel     Status: Abnormal   Collection Time: 08/12/14  4:35 AM  Result Value Ref Range   Sodium 141 135 - 145 mmol/L   Potassium 3.5 3.5 - 5.1 mmol/L   Chloride 109 101 - 111 mmol/L   CO2 23 22 - 32 mmol/L   Glucose, Bld 110 (H) 65 - 99 mg/dL   BUN 16 6 - 20 mg/dL   Creatinine, Ser 0.60 0.44 - 1.00 mg/dL   Calcium 8.6 (L) 8.9 - 10.3 mg/dL   Total Protein 5.7 (L) 6.5 - 8.1 g/dL   Albumin 2.8 (L) 3.5 - 5.0 g/dL   AST 19 15 - 41 U/L   ALT 17 14 - 54 U/L   Alkaline Phosphatase 77 38 - 126 U/L   Total Bilirubin 0.7 0.3 - 1.2 mg/dL   GFR calc non Af Amer >60 >60 mL/min   GFR calc Af Amer >60 >60 mL/min    Comment: (NOTE) The eGFR has been calculated using the CKD EPI  equation. This calculation has not been validated in all clinical situations. eGFR's persistently <60 mL/min signify possible Chronic Kidney Disease.    Anion gap 9 5 - 15     HEENT: Right CEA incision with swelling, dermabond, mild/mod tenderness Cardio: RRR and no murmur Resp: CTA B/L and unlabored GI: BS positive and NT, ND Extremity:  Pulses positive and No Edema Skin:   Wound C/D/I and mild erythema at borders Neuro: Alert/Oriented, Flat, Abnormal Sensory parasthesia Left hand but identifies LT, Abnormal Motor 2- Left triceps o/w 0/5 in LUE, LLE 4- HF, KE, ADF, right side 5/5, Abnormal FMC Ataxic/ dec FMC and Other no evidence of Left neglect Musc/Skel:  Other back pain with movement Gen NAD   Assessment/Plan: 1. Functional deficits secondary to Right MCA infarct which require 3+ hours per day of interdisciplinary therapy in a comprehensive inpatient rehab setting. Physiatrist is providing close team supervision and 24  hour management of active medical problems listed below. Physiatrist and rehab team continue to assess barriers to discharge/monitor patient progress toward functional and medical goals. FIM: FIM - Bathing Bathing Steps Patient Completed: Chest, Left Arm, Front perineal area, Abdomen, Buttocks, Right upper leg, Left upper leg Bathing: 3: Mod-Patient completes 5-7 28f 10 parts or 50-74%  FIM - Upper Body Dressing/Undressing Upper body dressing/undressing steps patient completed: Thread/unthread right sleeve of pullover shirt/dresss, Put head through opening of pull over shirt/dress Upper body dressing/undressing: 3: Mod-Patient completed 50-74% of tasks FIM - Lower Body Dressing/Undressing Lower body dressing/undressing steps patient completed: Thread/unthread right underwear leg, Thread/unthread left underwear leg, Thread/unthread right pants leg, Thread/unthread left pants leg Lower body dressing/undressing: 2: Max-Patient completed 25-49% of tasks         FIM - Control and instrumentation engineer Devices: Bed rails Bed/Chair Transfer: 4: Supine > Sit: Min A (steadying Pt. > 75%/lift 1 leg), 4: Bed > Chair or W/C: Min A (steadying Pt. > 75%)  FIM - Locomotion: Wheelchair Distance: 150; Unable to use L UE and B LEs do not reach floor. Locomotion: Wheelchair: 1: Total Assistance/staff pushes wheelchair (Pt<25%) FIM - Locomotion: Ambulation Locomotion: Ambulation Assistive Devices: Other (comment) (R HHA) Ambulation/Gait Assistance: 4: Min assist, 3: Mod assist Locomotion: Ambulation: 1: Travels less than 50 ft with moderate assistance (Pt: 50 - 74%)  Comprehension Comprehension Mode: Auditory Comprehension: 5-Understands complex 90% of the time/Cues < 10% of the time  Expression Expression Mode: Verbal Expression: 5-Expresses complex 90% of the time/cues < 10% of the time  Social Interaction Social Interaction: 5-Interacts appropriately 90% of the time - Needs monitoring or encouragement for participation or interaction.  Problem Solving Problem Solving: 4-Solves basic 75 - 89% of the time/requires cueing 10 - 24% of the time  Memory Memory: 4-Recognizes or recalls 75 - 89% of the time/requires cueing 10 - 24% of the time  Medical Problem List and Plan: 1. Functional deficits secondary to Right MCA infarct after CEA with left hemiparesis and sensory loss 2.  DVT Prophylaxis/Anticoagulation: Pharmaceutical: Lovenox, monitor surgical site for hematoma 3. Chronic back/knee pain/Pain Management: Will continue oxydone prn for pain management. Add Voltaren gel to left shoulder to help with shoulder pain as well as K pad for back pain.    4. H/o Depression/Mood:  Continue Zoloft and Remeron. Team to provide ego support. LCSW to follow for evaluation and support.   5. Neuropsych: This patient is capable of making decisions on her own behalf. 6. Skin/Wound Care:  Routine pressure relief measures.   7.  Fluids/Electrolytes/Nutrition: Monitor I/O. Check lytes in am.   8. Hypotension: Monitor BP every 8 hours. On Toprol for HR control.   9. ABLA: Post op bleeding. will monitor for signs of bleeding. Repeat hgb 8.5, down from 9.1, recheck in am   10. R-CEA:  On Lipitor,  Lovaza and ASA 11. Barrett's esophagitis: Continue  protonix bid with zofran prn for intermittent nausea.  12.  Hypoalb seen on CMET, start beneprotein  LOS (Days) 2 A FACE TO FACE EVALUATION WAS PERFORMED  KIRSTEINS,ANDREW E 08/13/2014, 7:47 AM

## 2014-08-14 ENCOUNTER — Inpatient Hospital Stay (HOSPITAL_COMMUNITY): Payer: Commercial Managed Care - HMO | Admitting: Speech Pathology

## 2014-08-14 ENCOUNTER — Inpatient Hospital Stay (HOSPITAL_COMMUNITY): Payer: Commercial Managed Care - HMO | Admitting: Occupational Therapy

## 2014-08-14 ENCOUNTER — Inpatient Hospital Stay (HOSPITAL_COMMUNITY): Payer: Commercial Managed Care - HMO | Admitting: Rehabilitation

## 2014-08-14 DIAGNOSIS — E43 Unspecified severe protein-calorie malnutrition: Secondary | ICD-10-CM

## 2014-08-14 DIAGNOSIS — I5032 Chronic diastolic (congestive) heart failure: Secondary | ICD-10-CM

## 2014-08-14 MED ORDER — BENEPROTEIN PO POWD
1.0000 | Freq: Three times a day (TID) | ORAL | Status: DC
  Administered 2014-08-14 – 2014-08-18 (×9): 6 g via ORAL
  Filled 2014-08-14 (×2): qty 227

## 2014-08-14 NOTE — IPOC Note (Signed)
Overall Plan of Care [facility]) Patient Details Name: [name] MRN: [id number] DOB: 1940/05/06  Admitting Diagnosis: RT CVA  Hospital Problems: Principal Problem:   Acute right arterial ischemic stroke, middle cerebral artery (MCA) Active Problems:   CAD (coronary artery disease)   Protein-calorie malnutrition, severe w/ electrolyte imbalance   Chronic diastolic CHF (congestive heart failure), NYHA class 2   Left hemiparesis     Functional Problem List: Nursing Medication Management, Pain, Safety, Skin Integrity, Bowel  PT Balance, Endurance, Motor, Pain, Perception, Safety, Sensory  OT Endurance, Safety, Motor  SLP Cognition  TR         Basic ADL's: OT Eating, Grooming, Bathing, Dressing, Toileting     Advanced  ADL's: OT Simple Meal Preparation     Transfers: PT Bed Mobility, Car, Bed to Chair, Manufacturing systems engineer, Metallurgist: PT Ambulation, Emergency planning/management officer, Stairs     Additional Impairments: OT Fuctional Use of Upper Extremity  SLP        TR      Anticipated Outcomes Item Anticipated Outcome  Self Feeding Mod I  Swallowing      Basic self-care  Min A  Toileting  Mod I   Bathroom Transfers Supervision  Bowel/Bladder  Manage bowel with medication with min assistance.  Transfers  mod I  Locomotion  supervision ambulation  Communication     Cognition  Pt will display Supervision- Mod Independence with her memory, reasoning, and problem solving deficits.  Pain  Maintain a pain level below 4.  Safety/Judgment  Remain free from falls while in rehab. with min. assistance.   Therapy Plan: PT Intensity: Minimum of 1-2 x/day ,45 to 90 minutes PT Frequency: 5 out of 7 days PT Duration Estimated Length of Stay: 12-14 days OT Intensity: Minimum of 1-2 x/day, 45 to 90 minutes OT Frequency: 5 out of 7 days OT Duration/Estimated Length of Stay: 10-14 days SLP Intensity: Minumum of 1-2 x/day, 30 to 90 minutes SLP Frequency: 3  to 5 out of 7 days SLP Duration/Estimated Length of Stay: 14-21 days       Team Interventions: Nursing Interventions Patient/Family Education, Bowel Management, Pain Management, Disease Management/Prevention, Medication Management, Skin Care/Wound Management, Discharge Planning  PT interventions Ambulation/gait training, Pain management, Stair training, Visual/perceptual remediation/compensation, Wheelchair propulsion/positioning, Therapeutic Activities, Patient/family education, DME/adaptive equipment instruction, Training and development officer, Cognitive remediation/compensation, Psychosocial support, Therapeutic Exercise, UE/LE Strength taining/ROM, Functional mobility training, Community reintegration, Discharge planning, Neuromuscular re-education, Splinting/orthotics, UE/LE Coordination activities  OT Interventions DME/adaptive equipment instruction, UE/LE Strength taining/ROM, Therapeutic Exercise, Therapeutic Activities, Self Care/advanced ADL retraining, UE/LE Coordination activities, Visual/perceptual remediation/compensation, Functional mobility training, Functional electrical stimulation, Neuromuscular re-education, Discharge planning, Patient/family education, Training and development officer  SLP Interventions Cognitive remediation/compensation, Cueing hierarchy, Functional tasks, Environmental controls, Internal/external aids, Medication managment, Oral motor exercises, Patient/family education, Speech/Language facilitation, Therapeutic Exercise, Therapeutic Activities  TR Interventions    SW/CM Interventions Discharge Planning, Psychosocial Support, Patient/Family Education    Team Discharge Planning: Destination: PT-Home ,OT- Home , SLP-Home Projected Follow-up: PT-Home health PT, OT-  Outpatient OT, SLP-  Projected Equipment Needs: PT-To be determined, OT- To be determined, SLP-  Equipment Details: PT-Patient owns RW, rollator, SPC, wheelchair, BSC, shower chair, OT-  Patient/family  involved in discharge planning: PT- Patient, Family Midwife,  OT-Patient, Family member/caregiver, SLP-Patient, Family member/caregiver  MD ELOS: 15-20d Medical Rehab Prognosis:  Good Assessment: 74 y.o. female with history of CAD, HTN, CAS, 2-3 weeks history of intermittent LUE numbness and weakness who was admitted on  08/08/14 for R-CEA by Dr. Oneida Alar. Post procedure she developed left sided weakness and stat carotid duplex showed patent cervical ICA. CT head questioned acute R-MCA infarct and cerebral angiogram showed no occlusions or filling defects. MRI brain done revealing acute right parietal and posterior frontal lobe infarcts. 2D echo revealed EF 65-70% and no wall abnormality. Neurology felt that patient with thromboembolic stroke post op and to continue ASA 162 mg daily   Now requiring 24/7 Rehab RN,MD, as well as CIR level PT, OT and SLP.  Treatment team will focus on ADLs and mobility with goals set at ModI/Sup  See Team Conference Notes for weekly updates to the plan of care

## 2014-08-14 NOTE — Progress Notes (Signed)
Social Work Assessment and Plan Social Work Assessment and Plan  Patient Details  Name: [name] MRN: [id number] Date of Birth: 02/11/1940  Today's Date: 08/14/2014  Problem List:  Patient Active Problem List   Diagnosis Date Noted  . Left hemiparesis 08/13/2014  . Acute right arterial ischemic stroke, middle cerebral artery (MCA) 08/11/2014  . Carotid artery stenosis, symptomatic 08/08/2014  . Carotid disease, bilateral 06/02/2014  . Coronary artery disease due to lipid rich plaque 06/02/2014  . Essential hypertension 06/02/2014  . Hyperlipidemia 06/02/2014  . Acute respiratory failure   . S/P colostomy takedown 01/24/2014  . Chronic diastolic CHF (congestive heart failure), NYHA class 2 11/10/2013  . Atypical chest pain 10/20/2013  . Malnutrition of moderate degree 09/18/2013  . SBO (small bowel obstruction) 09/17/2013  . H/O tracheostomy 09/17/2013  . Acute respiratory failure with hypoxia s/p tracheostomy 08/19/2013  . Physical deconditioning 08/19/2013  . HTN (hypertension) 08/19/2013  . Dehydration with hypernatremia 08/19/2013  . DVT of axillary vein, acute right 08/19/2013  . Anemia 08/19/2013  . Anasarca 08/14/2013  . Hypokalemia 07/31/2013  . Hypomagnesemia 07/31/2013  . Protein-calorie malnutrition, severe w/ electrolyte imbalance 07/31/2013  . Colonic obstruction due to suspected colitis; s/p colectomy/colostomy 07/29/2013  . CAD (coronary artery disease)   . Depression   . Barrett's esophagus   . Arthritis   . Other and unspecified angina pectoris 12/20/2012  . Chest pain 12/14/2012  . TOBACCO ABUSE 11/08/2009  . Hyperlipidemia, mixed 04/17/2008  . Carotid artery disease 04/17/2008   Past Medical History:  Past Medical History  Diagnosis Date  . Carotid artery disease     a. 60-70% bilat ICA stenosis by dopplers 05/2012.  Marland Kitchen Other and unspecified hyperlipidemia     takes Lipitor daily  . CAD (coronary artery disease)     a. Mod dz 2010  initially mgd medically. b. 12/2012 - angina s/p PTCA/DES to mid-circumflex, PTCA/DES to first OM.   Marland Kitchen Barrett's esophagus   . SBO (small bowel obstruction)     a. Lysis of adhesions and ovarian cystectomy 04/2013 for sBO. b. Admitted 07/2013 for colonic obstruction due to suspected colitis, s/p partial colectomy/colostomy 08/01/13. Admission complicated by anasarca, acute resp failure requiring tracheostomy, decannulated 08/25/13. c. Recurrent SBO8/2015, NGT placed for decompression.   . Colonic obstruction due to suspected colitis; s/p colectomy/colostomy     a. See SBO.  Marland Kitchen Protein-calorie malnutrition, severe w/ electrolyte imbalance     a. Severe hypoalbuminemia leading to 3rd spacing including anasarca and pulm edema 07/2013.  Marland Kitchen DVT of axillary vein, acute right     a. Dx 07/2013 felt due to R IJ central line that had been inserted 7/14. Not on anticoag due to GIB issues and small cerebral bleed.  . Acute respiratory failure with hypoxia s/p tracheostomy 07/2013  . Intracranial hemorrhage     a. 08/11/13 - per DC summary, tiny SAH vs SDH done for altered mentation, anticoag stopped including aspirin.  . Anemia     a. 7-08/2013 felt due to recent critical illness/chronic disease.  . Complication of anesthesia     hard to wake up from anesthesia   . Hypertension     takes Metoprolol daily  . HTN (hypertension)   . Heart murmur   . COPD (chronic obstructive pulmonary disease)   . Pneumonia     hx of->15 yrs ago  . History of bronchitis     > 71yr ago   . Dizziness     was on  Bentyl which caused this-took off of it and no problems since  . Stroke early 80's    right sided weakness--TIA  . Joint pain   . Chronic back pain     deteriorating;DDD  . Neck pain     DDD  . History of hiatal hernia   . History of colon polyps   . History of kidney stones     was told it was stable but doesn't know for sure that she ever passed it  . Depression     takes Remeron and Zoloft daily  . GERD  (gastroesophageal reflux disease)   . Arthritis     handds, knees & back    Past Surgical History:  Past Surgical History  Procedure Laterality Date  . Appendectomy    . Cholecystectomy    . Colon resection    . 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 Rotator cuff repair Left   . Abdominal hysterectomy    . Colon surgery    . Foot surgery Left   . Hand surgery Bilateral     for ganglion cysts  . Laparotomy N/A 08/01/2013    Procedure: EXPLORATORY LAPAROTOMY;  Surgeon: Harl Bowie, MD;  Location: [place];  Service: General;  Laterality: N/A;  . Partial colectomy N/A 08/01/2013    Procedure: PARTIAL COLECTOMY;  Surgeon: Harl Bowie, MD;  Location: [facility];  Service: General;  Laterality: N/A;  . Colostomy N/A 08/01/2013    Procedure: COLOSTOMY;  Surgeon: Harl Bowie, MD;  Location: [facility];  Service: General;  Laterality: N/A;  . Bowel resection N/A 08/01/2013    Procedure: SMALL BOWEL RESECTION;  Surgeon: Harl Bowie, MD;  Location: [facility];  Service: General;  Laterality: N/A;  . Tracheostomy      feinstein  . Left heart catheterization with coronary angiogram N/A 12/20/2012    Procedure: LEFT HEART CATHETERIZATION WITH CORONARY ANGIOGRAM;  Surgeon: Blane Ohara, MD;  Location: [facility] CATH LAB;  Service: Cardiovascular;  Laterality: N/A;  . Cataract surgery Bilateral   . Colostomy takedown  01/24/2014    dr Ninfa Linden  . Colostomy takedown N/A 01/24/2014    Procedure: COLOSTOMY TAKEDOWN;  Surgeon: Coralie Keens, MD;  Location: [place];  Service: General;  Laterality: N/A;  . Tonsillectomy    . Endarterectomy Right 08/08/2014    Procedure: RIGHT CAROTID ENDARTERECTOMY WITH HEMASHIELD PATCH ANGIOPLASTY;  Surgeon: Elam Dutch, MD;  Location: [place];  Service: Vascular;  Laterality: Right;   Social History:  reports that she has been smoking Cigarettes.  She has a 26.5 pack-year smoking history. She has never used  smokeless tobacco. She reports that she does not drink alcohol or use illicit drugs.  Family / Support Systems Marital Status: Married How Long?: 13 years Patient Roles: Spouse, Parent Spouse/Significant Other: Clinton  [phone] Children: Sharyn Lull McIntyre-daughter  [phone]  [phone] Other Supports: Chinera Rakfeld-daughter [phone] Anticipated Caregiver: Husband Ability/Limitations of Caregiver: Husband is here daily-drives, he rpeorts mild dementia makes lists. Daughter-Chinera checks back and forth-works [employer] but lives in [place] Caregiver Availability: 24/7 Family Dynamics: Close knit family who are involved and are supportive. Husband reports his ex-wife is pt's good buddy.  All get along well and will do whatever pt will need.  Social History Preferred language: English Religion: Non-Denominational Cultural Background: No issues Education: High School Read: Yes Write: Yes Employment Status: Retired Scientist, research (physical sciences)  Hisotry/Current Legal Issues: No issues Guardian/Conservator: None-according to MD pt is capable of making her own decisions while here, but husband is always here.   Abuse/Neglect Physical Abuse: Denies Verbal Abuse: Denies Sexual Abuse: Denies Exploitation of patient/patient's resources: Denies Self-Neglect: Denies  Emotional Status Pt's affect, behavior adn adjustment status: Pt is motivated to improve she is encouraged her leg is beginning to move, she hopes her arm begins. She has been though health issues last year and finally recovered and was home and now this. She is trying to stay positive about all of this. Her husband  is a strong emotional support for her. Recent Psychosocial Issues: Other health issues-has been to NH on two occassions, then went home from due to health issues Pyschiatric History: History of depression takes zoloft and remeron, feels they help.  But this is difficult to begin again when thought was coming in for a minor  procedure and this happened. Would benefit from neuro-psych to see while here. Substance Abuse History: Tobacco-aware of the need to quit and the resources out there to assist with this.  Husband should quit also, he is aware of this  Patient / Family Perceptions, Expectations & Goals Pt/Family understanding of illness & functional limitations: Pt and husband have spoken with MD and feel their questions are being answered and addressed. Husband has been checked off to transfer pt if can not get staff in time. Both feel good about this. Husband plans to be here daily to participate and learn pt;s care Premorbid pt/family roles/activities: Wife, Mother,grandmother, great grandmother, retiree, church member, etc Anticipated changes in roles/activities/participation: resume Pt/family expectations/goals: Pt states: " I want to do for myself before I leave here."  Husband states: " I will do whatever she needs, I have before with the other issues."  [employer]: Other (Comment) ([place]) Premorbid Home Care/DME Agencies: Other (Comment) ([facility] in the past) Transportation available at discharge: Husband and daughter's Resource referrals recommended: Neuropsychology, Support group (specify)  Discharge Planning Living Arrangements: Spouse/significant other, Children Support Systems: Spouse/significant other, Children, Other relatives, Friends/neighbors, Church/faith community Type of Residence: Private residence Insurance Resources: Multimedia programmer (specify) ([place] Medicare) Financial Resources: [place] Referred: No Living Expenses: Own Money Management: Spouse, Patient Does the patient have any problems obtaining your medications?: No Home Management: husband and pt do the home management Patient/Family Preliminary Plans: Return home with husband, daughter is in and out. Husband is the main caregiver and has provided support to pt  in the past. He is here and observing in therapies and has already been checked off to transfer her in the room. Will await team conference and work toward discharge needs. Social Work Anticipated Follow Up Needs: HH/OP, Support Group  Clinical Impression Pleasant couple who are motivated and very supportive of one another. Husband provides emotional support and cheer leads for pt. Very supportive children. Pt is doing well and should be a short length of stay. Husband has provided care last year when pt had health issues. Await team conference to develop a safe discharge plan.  Elease Hashimoto 08/14/2014, 11:12 AM

## 2014-08-14 NOTE — Progress Notes (Signed)
Speech Language Pathology Daily Session Note  Patient Details  Name: [name] MRN: [id number] Date of Birth: [birth year]  Today's Date: 08/14/2014 SLP Individual Time: 1000-1100 SLP Individual Time Calculation (min): 60 min  Short Term Goals: Week 1: SLP Short Term Goal 1 (Week 1): Pt will recall 2-3 new pieces of information given functional tasks with Supervision using visual/verbal cueing. SLP Short Term Goal 2 (Week 1): Pt will provide appropriate solutions to complex problem solving situations using functional tasks (i.e. money and/or medical management, etc) with Supervision using multimodal cueing. SLP Short Term Goal 3 (Week 1): Pt will display appropriate planning and organization skills using complex functional tasks (i.e. planning holiday meal, using time managment for busy day, etc) with min A  given mulitmodal cueing.  Skilled Therapeutic Interventions: Skilled treatment session focused on cognitive goals. SLP facilitated session by providing extra time and supervision verbal cues for mildly complex problem solving and organization in regards to money management tasks with personal finances.  Patient was also administered the MoCA and scored a 24/30 pointes with a score of 26 or above considered normal.  Patient demonstrated deficits in immediate recall, organization and selective attention.  Patient left upright in wheelchair with all needs within reach. Continue with current plan of care.    FIM:  Expression Expression Mode: Verbal Expression: 5-Expresses complex 90% of the time/cues < 10% of the time Social Interaction Social Interaction: 5-Interacts appropriately 90% of the time - Needs monitoring or encouragement for participation or interaction. Problem Solving Problem Solving: 5-Solves complex 90% of the time/cues < 10% of the time Memory Memory: 4-Recognizes or recalls 75 - 89% of the time/requires cueing 10 - 24% of the time  Pain Pain Assessment Pain  Assessment: No/denies pain  Therapy/Group: Individual Therapy  Pattie Flaharty 08/14/2014, 3:14 PM

## 2014-08-14 NOTE — Progress Notes (Signed)
Speech Language Pathology Daily Session Note  Patient Details  Name: [name] MRN: [id number] Date of Birth: October 10, 1940  Today's Date: 08/14/2014 SLP Individual Time: 6979-4801 SLP Individual Time Calculation (min): 32 min  Short Term Goals: Week 1: SLP Short Term Goal 1 (Week 1): Pt will recall 2-3 new pieces of information given functional tasks with Supervision using visual/verbal cueing. SLP Short Term Goal 2 (Week 1): Pt will provide appropriate solutions to complex problem solving situations using functional tasks (i.e. money and/or medical management, etc) with Supervision using multimodal cueing. SLP Short Term Goal 3 (Week 1): Pt will display appropriate planning and organization skills using complex functional tasks (i.e. planning holiday meal, using time managment for busy day, etc) with min A  given mulitmodal cueing.  Skilled Therapeutic Interventions: Pt was seen for skilled ST targeting cognitive goals.  Upon arrival, pt was seated upright in wheelchair, awake, alert, and agreeable to participating in [place].  SLP facilitated the session with a structured new learning activity/card game targeting functional problem solving.  Pt completed the abovementioned task with >80% accuracy with overall min assist verbal cues for thought organization and working memory.  SLP increased task complexity with interruptions of functional conversations regarding pt's hobbies and interests and pt was able to alternate her attention between the therapist and activity with supervision.  Pt was left upright in wheelchair with husband present at bedside, all needs left within reach. Continue per current plan of care.    FIM:  Comprehension Comprehension Mode: Auditory Comprehension: 5-Understands complex 90% of the time/Cues < 10% of the time Expression Expression Mode: Verbal Expression: 5-Expresses complex 90% of the time/cues < 10% of the time Social Interaction Social Interaction:  5-Interacts appropriately 90% of the time - Needs monitoring or encouragement for participation or interaction. Problem Solving Problem Solving: 5-Solves complex 90% of the time/cues < 10% of the time Memory Memory: 4-Recognizes or recalls 75 - 89% of the time/requires cueing 10 - 24% of the time FIM - Eating Eating Activity: 7: Complete independence:no helper  Pain Pain Assessment Pain Assessment: No/denies pain  Therapy/Group: Individual Therapy  Randall Rampersad, Selinda Orion 08/14/2014, 3:49 PM

## 2014-08-14 NOTE — Progress Notes (Signed)
Subjective/Complaints: "am I going to use my Left hand?" No left arm pain, no other issues going on Review of Systems - no problem with bowels or bladder, no SOB Objective: Vital Signs: Blood pressure 139/50, pulse 68, temperature 98 F (36.7 C), temperature source Oral, resp. rate 16, weight 83 kg (182 lb 15.7 oz), SpO2 97 %. No results found. Results for orders placed or performed during the hospital encounter of 08/11/14 (from the past 72 hour(s))  CBC WITH DIFFERENTIAL     Status: Abnormal   Collection Time: 08/12/14  4:35 AM  Result Value Ref Range   WBC 6.3 4.0 - 10.5 K/uL   RBC 3.27 (L) 3.87 - 5.11 MIL/uL   Hemoglobin 8.5 (L) 12.0 - 15.0 g/dL   HCT 26.3 (L) 60.9 - 73.4 %   MCV 79.5 78.0 - 100.0 fL   MCH 26.0 26.0 - 34.0 pg   MCHC 32.7 30.0 - 36.0 g/dL   RDW 77.0 (H) 23.4 - 10.7 %   Platelets 171 150 - 400 K/uL   Neutrophils Relative % 54 43 - 77 %   Neutro Abs 3.4 1.7 - 7.7 K/uL   Lymphocytes Relative 33 12 - 46 %   Lymphs Abs 2.0 0.7 - 4.0 K/uL   Monocytes Relative 8 3 - 12 %   Monocytes Absolute 0.5 0.1 - 1.0 K/uL   Eosinophils Relative 5 0 - 5 %   Eosinophils Absolute 0.3 0.0 - 0.7 K/uL   Basophils Relative 0 0 - 1 %   Basophils Absolute 0.0 0.0 - 0.1 K/uL  Comprehensive metabolic panel     Status: Abnormal   Collection Time: 08/12/14  4:35 AM  Result Value Ref Range   Sodium 141 135 - 145 mmol/L   Potassium 3.5 3.5 - 5.1 mmol/L   Chloride 109 101 - 111 mmol/L   CO2 23 22 - 32 mmol/L   Glucose, Bld 110 (H) 65 - 99 mg/dL   BUN 16 6 - 20 mg/dL   Creatinine, Ser 1.48 0.44 - 1.00 mg/dL   Calcium 8.6 (L) 8.9 - 10.3 mg/dL   Total Protein 5.7 (L) 6.5 - 8.1 g/dL   Albumin 2.8 (L) 3.5 - 5.0 g/dL   AST 19 15 - 41 U/L   ALT 17 14 - 54 U/L   Alkaline Phosphatase 77 38 - 126 U/L   Total Bilirubin 0.7 0.3 - 1.2 mg/dL   GFR calc non Af Amer >60 >60 mL/min   GFR calc Af Amer >60 >60 mL/min    Comment: (NOTE) The eGFR has been calculated using the CKD EPI  equation. This calculation has not been validated in all clinical situations. eGFR's persistently <60 mL/min signify possible Chronic Kidney Disease.    Anion gap 9 5 - 15     HEENT: Right CEA incision with swelling, dermabond, mild/mod tenderness Cardio: RRR and no murmur Resp: CTA B/L and unlabored GI: BS positive and NT, ND Extremity:  Pulses positive and No Edema Skin:   Wound C/D/I and mild erythema at borders Neuro: Alert/Oriented, Flat, Abnormal Sensory parasthesia Left hand but identifies LT, Abnormal Motor 2- Left triceps o/w 0/5 in LUE, LLE 4- HF, KE, ADF, right side 5/5, Abnormal FMC Ataxic/ dec FMC and Other no evidence of Left neglect Musc/Skel:  Other back pain with movement Gen NAD   Assessment/Plan: 1. Functional deficits secondary to Right MCA infarct which require 3+ hours per day of interdisciplinary therapy in a comprehensive inpatient rehab setting.- husband states  he was needing to help her with showering after intracranial bleed last year Physiatrist is providing close team supervision and 24 hour management of active medical problems listed below. Physiatrist and rehab team continue to assess barriers to discharge/monitor patient progress toward functional and medical goals. FIM: FIM - Bathing Bathing Steps Patient Completed: Chest, Left Arm, Front perineal area, Abdomen, Buttocks, Right upper leg, Left upper leg Bathing: 0: Activity did not occur  FIM - Upper Body Dressing/Undressing Upper body dressing/undressing steps patient completed: Thread/unthread right sleeve of pullover shirt/dresss, Put head through opening of pull over shirt/dress Upper body dressing/undressing: 0: Activity did not occur FIM - Lower Body Dressing/Undressing Lower body dressing/undressing steps patient completed: Thread/unthread right underwear leg, Thread/unthread left underwear leg, Thread/unthread right pants leg, Thread/unthread left pants leg Lower body dressing/undressing: 0:  Activity did not occur  FIM - Toileting Toileting steps completed by patient: Performs perineal hygiene Toileting Assistive Devices: Grab bar or rail for support Toileting: 2: Max-Patient completed 1 of 3 steps  FIM - Air cabin crew Transfers: 4-To toilet/BSC: Min A (steadying Pt. > 75%), 4-From toilet/BSC: Min A (steadying Pt. > 75%)  FIM - Bed/Chair Transfer Bed/Chair Transfer Assistive Devices: Bed rails Bed/Chair Transfer: 4: Bed > Chair or W/C: Min A (steadying Pt. > 75%), 4: Chair or W/C > Bed: Min A (steadying Pt. > 75%)  FIM - Locomotion: Wheelchair Distance: 150; Unable to use L UE and B LEs do not reach floor. Locomotion: Wheelchair: 1: Total Assistance/staff pushes wheelchair (Pt<25%) FIM - Locomotion: Ambulation Locomotion: Ambulation Assistive Devices: Other (comment) (R HHA) Ambulation/Gait Assistance: 4: Min assist, 3: Mod assist Locomotion: Ambulation: 1: Travels less than 50 ft with moderate assistance (Pt: 50 - 74%)  Comprehension Comprehension Mode: Auditory Comprehension: 5-Understands complex 90% of the time/Cues < 10% of the time  Expression Expression Mode: Verbal Expression: 5-Expresses complex 90% of the time/cues < 10% of the time  Social Interaction Social Interaction: 5-Interacts appropriately 90% of the time - Needs monitoring or encouragement for participation or interaction.  Problem Solving Problem Solving: 4-Solves basic 75 - 89% of the time/requires cueing 10 - 24% of the time  Memory Memory: 5-Recognizes or recalls 90% of the time/requires cueing < 10% of the time  Medical Problem List and Plan: 1. Functional deficits secondary to Right MCA infarct after CEA with left hemiparesis and sensory loss 2.  DVT Prophylaxis/Anticoagulation: Pharmaceutical: Lovenox, monitor surgical site for hematoma 3. Chronic back/knee pain/Pain Management: Will continue oxydone prn for pain management. Add Voltaren gel to left shoulder to help with  shoulder pain as well as K pad for back pain.    4. H/o Depression/Mood:  Continue Zoloft and Remeron. Team to provide ego support. LCSW to follow for evaluation and support.   5. Neuropsych: This patient is capable of making decisions on her own behalf. 6. Skin/Wound Care:  Routine pressure relief measures.   7. Fluids/Electrolytes/Nutrition: Monitor I/O. Check lytes in am.   8. Hypotension: Monitor BP every 8 hours. On Toprol for HR control.  HR 68 this am-good 9. ABLA: Post op bleeding. will monitor for signs of bleeding. Repeat hgb 8.5, down from 9.1, recheck in am   10. R-CEA:  On Lipitor,  Lovaza and ASA 11. Barrett's esophagitis: Continue  protonix bid with zofran prn for intermittent nausea.  12.  Hypoalb seen on CMET, start beneprotein  LOS (Days) 3 A FACE TO FACE EVALUATION WAS PERFORMED  [name] 08/14/2014, 7:39 AM

## 2014-08-14 NOTE — Care Management Note (Signed)
Inpatient [facility] Individual Statement of Services  Patient Name:  [name]  Date:  08/14/2014  Welcome to the [facility].  Our goal is to provide you with an individualized program based on your diagnosis and situation, designed to meet your specific needs.  With this comprehensive rehabilitation program, you will be expected to participate in at least 3 hours of rehabilitation therapies Monday-Friday, with modified therapy programming on the weekends.  Your rehabilitation program will include the following services:  Physical Therapy (PT), Occupational Therapy (OT), Speech Therapy (ST), 24 hour per day rehabilitation nursing, Therapeutic Recreaction (TR), Case Management (Social Worker), Rehabilitation Medicine, Nutrition Services and Pharmacy Services  Weekly team conferences will be held on Wenesday to discuss your progress.  Your Social Worker will talk with you frequently to get your input and to update you on team discussions.  Team conferences with you and your family in attendance may also be held.  Expected length of stay: 12-14 days Overall anticipated outcome: Supervision-cueing  Depending on your progress and recovery, your program may change. Your Social Worker will coordinate services and will keep you informed of any changes. Your Social Worker's name and contact numbers are listed  below.  The following services may also be recommended but are not provided by the [facility] will be made to provide these services after discharge if needed.  Arrangements include referral to agencies that provide these services.  Your insurance has been verified to be:  [employer] Your primary doctor is:  Amy Moon-NP  Pertinent information will be shared with your doctor and your insurance company.  Social  Worker:  Ovidio Kin, [place] or ([phone]  Information discussed with and copy given to patient by: Elease Hashimoto, 08/14/2014, 9:51 AM

## 2014-08-14 NOTE — Progress Notes (Signed)
Physical Therapy Session Note  Patient Details  Name: [name] MRN: [id number] Date of Birth: 10-07-1940  Today's Date: 08/14/2014 PT Individual Time: 0800-0900 PT Individual Time Calculation (min): 60 min   Short Term Goals: Week 1:  PT Short Term Goal 1 (Week 1): Patient will perform bed mobility with minA from flat surface. PT Short Term Goal 2 (Week 1): Patient will perform stand pivot transfers without AD and supervision. PT Short Term Goal 3 (Week 1): Patient will ambulate 100' without AD and minA. PT Short Term Goal 4 (Week 1): Patient will negotiate one curb step with appropriate AD/HHA with minA to simulate STE home.  Skilled Therapeutic Interventions/Progress Updates:   Pt received sitting in w/c in room, husband present during session to observe and also be checked off on transfers w/c<>bed and w/c<>toilet to increase independence in room.  Pt and husband able to perform stand pivot transfers with use of grab bars in bathroom.  Performed at min A level with min cues on hand placement to husband as well as allowing pt to initiate movement rather than "pulling" on her trunk.  Assisted pt to/from therapy gym for time management and energy conservation.  Skilled session focused on WB through [place] and NMR with slow graded movements in tall kneeling, quadruped, and sit<>stand with LUE planted on kay bench and arm chair while reaching for objects.  Provided facilitation in all positions for increased forward and L lateral weight shift as well as at LUE for stabilization and increased L elbow extension.  Pt with mild c/o pain in LUE, therefore modified activity as needed for increased comfort.  Also performed gait during session x 2 reps of 65' without AD at min A level with cues for increased stride length, facilitation for increased L weight shift and also at glute for increase activation to prevent hip abd during stance phase of gait.  Tolerated all well.  Transferred back to w/c  and assisted back to room. Left in w/c with all needs in reach.   Therapy Documentation Precautions:  Precautions Precautions: Fall Restrictions Weight Bearing Restrictions: No   Vital Signs: Therapy Vitals Temp: 98 F (36.7 C) Temp Source: Oral Pulse Rate: 68 Resp: 16 BP: (!) 139/50 mmHg Patient Position (if appropriate): Lying Oxygen Therapy SpO2: 97 % O2 Device: Not Delivered Pain: Pt with slight stretching pain in LUE during WB, allowed rest breaks and repositioning to alleviate.   See FIM for current functional status  Therapy/Group: Individual Therapy  Denice Bors 08/14/2014, 8:36 AM

## 2014-08-14 NOTE — Progress Notes (Signed)
[facility]  Subjective  - wants to go home   Objective 130/56 87 98 F (36.7 C) (Oral) 16 97%  Intake/Output Summary (Last 24 hours) at 08/14/14 1345 Last data filed at 08/14/14 1300  Gross per 24 hour  Intake    600 ml  Output      1 ml  Net    599 ml   Still with no real left hand function Neck incision healing  Assessment/Planning: Emphasized benefit of rehab to patient Will continue to follow   [name] 08/14/2014 1:45 PM --  Laboratory Lab Results:  Recent Labs  08/12/14 0435  WBC 6.3  HGB 8.5*  HCT 26.0*  PLT 171   BMET  Recent Labs  08/12/14 0435  NA 141  K 3.5  CL 109  CO2 23  GLUCOSE 110*  BUN 16  CREATININE 0.60  CALCIUM 8.6*    COAG Lab Results  Component Value Date   INR 1.03 08/01/2014   INR 1.16 09/17/2013   INR 1.20 08/11/2013   No results found for: PTT

## 2014-08-14 NOTE — Progress Notes (Signed)
Patient information reviewed and entered into eRehab system by Thresia Ramanathan, RN, CRRN, PPS Coordinator.  Information including medical coding and functional independence measure will be reviewed and updated through discharge.     Per nursing patient was given "Data Collection Information Summary for Patients in Inpatient Rehabilitation Facilities with attached "Privacy Act Statement-Health Care Records" upon admission.  

## 2014-08-14 NOTE — Progress Notes (Signed)
Occupational Therapy Session Note  Patient Details  Name: [name] MRN: [id number] Date of Birth: Jun 17, 1940  Today's Date: 08/14/2014 OT Individual Time: 0321-2248 OT Individual Time Calculation (min): 60 min    Skilled Therapeutic Interventions/Progress Updates:    Pt worked on Brewing technologist for the LUE during session.  Attempted use of Bioness NMES for the left hand however pt unable to tolerate stimulus at a high enough level to help facilitate digit flexion and extension.  Attempted UBE as well during session with therapist ace wrapping the LUE on the handle, but pt voiced pain in the left anterior glenohumeral joint when attempting revolutions.  Transitioned to sitting EOM to work on LUE weightbearing tasks as well.  Had pt stabilize her balance using the LUE as she reached across her body with the RUE.  Focused on sustained activation of elbow extensors.  Also had pt place her LUE on therapist's shoulder and push him away using left elbow extension.  Progressed to having her work on grading movements to allow therapist to move toward and away from the pt without letting her hand fall off of the therapist's shoulder.  Concluded session with hand over hand guidance for reaching with the LUE to retrieve object at midline.  Pt is able to demonstrate elbow and shoulder movement but lacks any active digit movement for grasp and release.  May benefit from kinesiotaping of the left shoulder to increase alignment and decrease shoulder pain.   Therapy Documentation Precautions:  Precautions Precautions: Fall Precaution Comments: LUE hemiparesis with subluxation and shoulder pain Restrictions Weight Bearing Restrictions: No  Pain: Pain Assessment Pain Assessment: No/denies pain ADL: See FIM for current functional status  Therapy/Group: Individual Therapy  Yeny Schmoll OTR/L 08/14/2014, 4:14 PM

## 2014-08-15 ENCOUNTER — Inpatient Hospital Stay (HOSPITAL_COMMUNITY): Payer: Commercial Managed Care - HMO | Admitting: *Deleted

## 2014-08-15 ENCOUNTER — Inpatient Hospital Stay (HOSPITAL_COMMUNITY): Payer: Commercial Managed Care - HMO

## 2014-08-15 ENCOUNTER — Inpatient Hospital Stay (HOSPITAL_COMMUNITY): Payer: Commercial Managed Care - HMO | Admitting: Rehabilitation

## 2014-08-15 ENCOUNTER — Inpatient Hospital Stay (HOSPITAL_COMMUNITY): Payer: Commercial Managed Care - HMO | Admitting: Speech Pathology

## 2014-08-15 DIAGNOSIS — D638 Anemia in other chronic diseases classified elsewhere: Secondary | ICD-10-CM

## 2014-08-15 LAB — CBC
HCT: 31.1 % — ABNORMAL LOW (ref 36.0–46.0)
HEMOGLOBIN: 9.9 g/dL — AB (ref 12.0–15.0)
MCH: 26.1 pg (ref 26.0–34.0)
MCHC: 31.8 g/dL (ref 30.0–36.0)
MCV: 81.8 fL (ref 78.0–100.0)
Platelets: 260 10*3/uL (ref 150–400)
RBC: 3.8 MIL/uL — ABNORMAL LOW (ref 3.87–5.11)
RDW: 17 % — AB (ref 11.5–15.5)
WBC: 6.6 10*3/uL (ref 4.0–10.5)

## 2014-08-15 NOTE — Progress Notes (Signed)
Subjective/Complaints: Was very tired after therapy yesterday , back pain interfered with sleep Review of Systems - no problem with bowels or bladder, no SOB Objective: Vital Signs: Blood pressure 119/44, pulse 65, temperature 97.5 F (36.4 C), temperature source Oral, resp. rate 17, weight 83 kg (182 lb 15.7 oz), SpO2 98 %. No results found. No results found for this or any previous visit (from the past 72 hour(s)).   HEENT: Right CEA incision with swelling, dermabond, mild/mod tenderness Cardio: RRR and no murmur Resp: CTA B/L and unlabored GI: BS positive and NT, ND Extremity:  Pulses positive and No Edema Skin:   Wound C/D/I and mild erythema at borders Neuro: Alert/Oriented, Flat, Abnormal Sensory parasthesia Left hand but identifies LT, Abnormal Motor 2- Left triceps o/w 0/5 in LUE, LLE 4- HF, KE, ADF, right side 5/5, Abnormal FMC Ataxic/ dec FMC and Other no evidence of Left neglect Musc/Skel:  Other back pain with movement, Left shoulder pain with ROM, tenderness over pectoralis, neg impingement Gen NAD   Assessment/Plan: 1. Functional deficits secondary to Right MCA infarct which require 3+ hours per day of interdisciplinary therapy in a comprehensive inpatient rehab setting.- husband states he was needing to help her with showering after intracranial bleed last year Physiatrist is providing close team supervision and 24 hour management of active medical problems listed below. Physiatrist and rehab team continue to assess barriers to discharge/monitor patient progress toward functional and medical goals. FIM: FIM - Bathing Bathing Steps Patient Completed: Chest, Left Arm, Front perineal area, Abdomen, Buttocks, Right upper leg, Left upper leg Bathing: 0: Activity did not occur  FIM - Upper Body Dressing/Undressing Upper body dressing/undressing steps patient completed: Thread/unthread right sleeve of pullover shirt/dresss, Put head through opening of pull over  shirt/dress Upper body dressing/undressing: 0: Activity did not occur FIM - Lower Body Dressing/Undressing Lower body dressing/undressing steps patient completed: Thread/unthread right underwear leg, Thread/unthread left underwear leg, Thread/unthread right pants leg, Thread/unthread left pants leg Lower body dressing/undressing: 0: Activity did not occur  FIM - Toileting Toileting steps completed by patient: Adjust clothing prior to toileting, Performs perineal hygiene, Adjust clothing after toileting Toileting Assistive Devices: Grab bar or rail for support Toileting: 4: Steadying assist  FIM - Radio producer Devices: Grab bars Toilet Transfers: 4-To toilet/BSC: Min A (steadying Pt. > 75%), 4-From toilet/BSC: Min A (steadying Pt. > 75%) (performed transfer w/ husband, did not toilet)  FIM - Bed/Chair Transfer Bed/Chair Transfer Assistive Devices: Bed rails Bed/Chair Transfer: 4: Sit > Supine: Min A (steadying pt. > 75%/lift 1 leg), 4: Bed > Chair or W/C: Min A (steadying Pt. > 75%), 4: Chair or W/C > Bed: Min A (steadying Pt. > 75%)  FIM - Locomotion: Wheelchair Distance: 150; Unable to use L UE and B LEs do not reach floor. Locomotion: Wheelchair: 0: Activity did not occur FIM - Locomotion: Ambulation Locomotion: Ambulation Assistive Devices: Other (comment) (HHA) Ambulation/Gait Assistance: 4: Min assist Locomotion: Ambulation: 2: Travels 50 - 149 ft with minimal assistance (Pt.>75%)  Comprehension Comprehension Mode: Auditory Comprehension: 5-Understands basic 90% of the time/requires cueing < 10% of the time  Expression Expression Mode: Verbal Expression: 5-Expresses basic 90% of the time/requires cueing < 10% of the time.  Social Interaction Social Interaction: 5-Interacts appropriately 90% of the time - Needs monitoring or encouragement for participation or interaction.  Problem Solving Problem Solving: 5-Solves basic 90% of the  time/requires cueing < 10% of the time  Memory Memory: 4-Recognizes or recalls  75 - 89% of the time/requires cueing 10 - 24% of the time  Medical Problem List and Plan: 1. Functional deficits secondary to Right MCA infarct after CEA with left hemiparesis and sensory loss 2.  DVT Prophylaxis/Anticoagulation: Pharmaceutical: Lovenox, monitor surgical site for hematoma 3. Chronic back/knee pain/Pain Management: Will continue oxydone prn for pain management. Add Voltaren gel to left shoulder to help with shoulder pain as well as K pad for back pain.   Some of the soreness is muscular 4. H/o Depression/Mood:  Continue Zoloft and Remeron. Team to provide ego support. LCSW to follow for evaluation and support.   5. Neuropsych: This patient is capable of making decisions on her own behalf. 6. Skin/Wound Care:  Routine pressure relief measures.   7. Fluids/Electrolytes/Nutrition: Monitor I/O. 959m fluid, 95% meals  8. Hypotension: Monitor BP every 8 hours. On Toprol for HR control.  HR 68 this am-good 9. ABLA: Post op bleeding. will monitor for signs of bleeding. Repeat hgb 8.5, down from 9.1, recheck today 10. R-CEA:  On Lipitor,  Lovaza and ASA 11. Barrett's esophagitis: Continue  protonix bid with zofran prn for intermittent nausea.  12.  Hypoalb seen on CMET, start beneprotein  LOS (Days) 4 A FACE TO FACE EVALUATION WAS PERFORMED  Yeray Tomas E 08/15/2014, 7:55 AM

## 2014-08-15 NOTE — Progress Notes (Signed)
Speech Language Pathology Daily Session Note  Patient Details  Name: [name] MRN: [id number] Date of Birth: 05-Aug-1940  Today's Date: 08/15/2014 SLP Individual Time: 0804-0900 SLP Individual Time Calculation (min): 56 min  Short Term Goals: Week 1: SLP Short Term Goal 1 (Week 1): Pt will recall 2-3 new pieces of information given functional tasks with Supervision using visual/verbal cueing. SLP Short Term Goal 2 (Week 1): Pt will provide appropriate solutions to complex problem solving situations using functional tasks (i.e. money and/or medical management, etc) with Supervision using multimodal cueing. SLP Short Term Goal 3 (Week 1): Pt will display appropriate planning and organization skills using complex functional tasks (i.e. planning holiday meal, using time managment for busy day, etc) with min A  given mulitmodal cueing.  Skilled Therapeutic Interventions:  Pt was seen for skilled ST targeting cognitive goals.  Upon arrival, pt was seated upright in wheelchair, awake, alert and agreeable to participate in [place].  SLP facilitated the session with a medication management task targeting functional problem solving for familiar home management activities.  Pt recalled function and frequency of medications when named for 80% accuracy (new medications were more difficult to recall than previously known medications) with supervision question cues.  Pt then loaded pills of varying dosages and frequencies into a pill organizer with set up assist.  Pt recognized and corrected errors with supervision instructional cues.  SLP recommended that pt use a 4x daily pill box at home to facilitate improved recall and organization for medication management.  SLP also recommended that pt have initial assist for medication management at home.  Pt and pt's husband were in agreement.  Pt was left in wheelchair with all needs within reach and husband present.  Continue per current plan of care.    FIM:   Comprehension Comprehension Mode: Auditory Comprehension: 5-Understands basic 90% of the time/requires cueing < 10% of the time Expression Expression Mode: Verbal Expression: 5-Expresses basic 90% of the time/requires cueing < 10% of the time. Social Interaction Social Interaction: 5-Interacts appropriately 90% of the time - Needs monitoring or encouragement for participation or interaction. Problem Solving Problem Solving: 5-Solves basic 90% of the time/requires cueing < 10% of the time Memory Memory: 4-Recognizes or recalls 75 - 89% of the time/requires cueing 10 - 24% of the time  Pain Pain Assessment Pain Assessment: No/denies pain  Therapy/Group: Individual Therapy  Whitlee Sluder, Selinda Orion 08/15/2014, 10:01 AM

## 2014-08-15 NOTE — Progress Notes (Signed)
Physical Therapy Session Note  Patient Details  Name: [name] MRN: [id number] Date of Birth: Apr 13, 1940  Today's Date: 08/15/2014 PT Individual Time: 1000-1100 PT Individual Time Calculation (min): 60 min   Short Term Goals: Week 1:  PT Short Term Goal 1 (Week 1): Patient will perform bed mobility with minA from flat surface. PT Short Term Goal 2 (Week 1): Patient will perform stand pivot transfers without AD and supervision. PT Short Term Goal 3 (Week 1): Patient will ambulate 100' without AD and minA. PT Short Term Goal 4 (Week 1): Patient will negotiate one curb step with appropriate AD/HHA with minA to simulate STE home.  Skilled Therapeutic Interventions/Progress Updates:   Pt received sitting in w/c in room, agreeable to therapy session.  Skilled session focused on assessing gait with varying AD as well as community integration with gait on outdoor surfaces, up/down inclines, up/down curb step as well as through grassy areas to challenge balance and better assess safety with these tasks at home.  PT provided demonstration with use of SBQC initially with step through gait pattern.  Pt able to return demonstration x 60' with min/guard A with min cues for posture and looking up.  Then trailed use of SPC in same fashion.  Pt returned demonstration again at min/guard level with same cues as above.  Pt more comfortable with use of SPC, therefore feel pt safe enough to use this for balance with gait.  Assisted outside in w/c for energy conservation and time management.  Once outside pt able to ambulate >300' at a time with [facility] at min/guard to close S level.  Requires max A to recover from near fall when performing curb step, therefore again descending with LLE at min/mod A level.  Educated on safety when ambulating through grass or other uneven surfaces.  Also discussed possible outing tomorrow in order to assess more community integration.  Pt very agreeable to outing and also suggested  that we could shop for items that she can then make in kitchen on the next day.  Ambulated another 250' up/down large ramp as above.  Assisted back inside and to therapy gym.  Ended session with seated nustep x 6 mins with BUEs/LEs initially, however pt with increased pain in LUE, therefore removed from assisting strap and utilized other extremities at level 4 resistance for NMR through LLE and overall activity tolerance.  Tolerated well.  Assisted back to room and left in w/c with all needs in reach.   Therapy Documentation Precautions:  Precautions Precautions: Fall Precaution Comments: LUE hemiparesis with subluxation and shoulder pain Restrictions Weight Bearing Restrictions: No  Pain: Pain Assessment Pain Assessment: No/denies pain  See FIM for current functional status  Therapy/Group: Individual Therapy  Denice Bors 08/15/2014, 10:53 AM

## 2014-08-15 NOTE — Progress Notes (Signed)
Occupational Therapy Session Note  Patient Details  Name: [name] MRN: [id number] Date of Birth: 1940/08/23  Today's Date: 08/15/2014 OT Individual Time: 7322-0254 and 1300-1330 OT Individual Time Calculation (min): 57 min and 30 min    Short Term Goals: Week 1:  OT Short Term Goal 1 (Week 1): Pt will complete upper body dressing with supervision OT Short Term Goal 2 (Week 1): Pt will bathe sitting and standing with min assist OT Short Term Goal 3 (Week 1): Pt will complete lower body dressing with mod assist OT Short Term Goal 4 (Week 1): Pt will demo ability to use LUE at diminished level during BADL OT Short Term Goal 5 (Week 1): Pt will demo ability to complete HEP for NMR of LUE with supervision  Skilled Therapeutic Interventions/Progress Updates:    Session 1: Pt seen for 1:1 OT session with a focus on LUE NMR, standing balance, functional mobility, and activity tolerance. Pt received seated in w/c with husband present. Pt declined ADL this AM due to completing routine with husband prior to session. Pt propelled to therapy gym and transferred w/c>mat via stand-pivot with min A. Pt engaged in LUE weight-bearing seated EOB with pt reaching across midline with RUE and placing card on elevated surface. Therapist then completed PROM of LUE with a focus on incorporation of PNF patterns. Pt then engaged in LUE towel glides seated with a focus on isolated movements at the shoulder. Pt completed sit<>stand at high-low table with min A and engaged in tabletop activity in standing with LUE in weight-bearing position. Pt engaged in activity for 9 minutes before requiring rest break. Pt then transitioned to watering plants in standing incorporating side-stepping with min A. Pt completed functional ambulation for apprx 75' with min A. Pt left seated in w/c with husband present awaiting PT arrival.   Session 2: Pt seen for 1:1 OT session with a focus on LUE NMR, standing balance, functional  ambulation, activity tolerance, and safety awareness. Pt received supine in bed asleep. Pt completed supine>sit with supervision and sit>stand with SBA. Pt ambulated to ortho gym apprx 80' via [place] and min guard. Pt engaged in LUE weight-bearing in standing with RUE reach across midline and placing on vertical surface. Pt engaged in task 2x for 5 min each with min A to facilitate increased w/b. Pt then engaged in RUE therapy ball slides in all directions to facilitate increased weight-bearing in LUE. Following w/b activities, pt demonstrated flexion of digits into composite fist. Pt educated on importance of weight-bearing and ROM activities throughout the day. Pt then ambulated back to room apprx 80' via [place] and min guard. Pt left seated in w/c with husband present and all other needs within reach.   Therapy Documentation Precautions:  Precautions Precautions: Fall Precaution Comments: LUE hemiparesis with subluxation and shoulder pain Restrictions Weight Bearing Restrictions: No General:   Vital Signs:  Pain: Pain Assessment Pain Assessment: No/denies pain ADL: ADL ADL Comments: see FIM Exercises:   Other Treatments:    See FIM for current functional status  Therapy/Group: Individual Therapy  Dorann Ou 08/15/2014, 11:11 AM

## 2014-08-16 ENCOUNTER — Inpatient Hospital Stay (HOSPITAL_COMMUNITY): Payer: Commercial Managed Care - HMO | Admitting: Occupational Therapy

## 2014-08-16 ENCOUNTER — Inpatient Hospital Stay (HOSPITAL_COMMUNITY): Payer: Commercial Managed Care - HMO | Admitting: *Deleted

## 2014-08-16 ENCOUNTER — Inpatient Hospital Stay (HOSPITAL_COMMUNITY): Payer: Commercial Managed Care - HMO | Admitting: Speech Pathology

## 2014-08-16 NOTE — Progress Notes (Signed)
Recreational Therapy Session Note  Patient Details  Name: [name] MRN: [id number] Date of Birth: [birth year] Today's Date: 08/16/2014  Pt participated in community reintegration/outing to the grocery store at overall close supervision/contact guard assist ambulatory level while pushing the grocery cart or with SPC.  Therapeutic goals included safe functional mobility in community environment, identification and negotiation of obstacles, dynamic standing balance while retrieving items safely from various heights of shelves, attention to the left-specifically LUE, mental math calculations & energy conservation techniques.  See outing goal sheet in shadow chart for full details.  No c/o pain. [place] 08/16/2014, 2:32 PM

## 2014-08-16 NOTE — Progress Notes (Signed)
Subjective/Complaints: Left shoulder pain improving Review of Systems - no problem with bowels or bladder, no SOB Objective: Vital Signs: Blood pressure 143/47, pulse 79, temperature 98.8 F (37.1 C), temperature source Oral, resp. rate 18, weight 83 kg (182 lb 15.7 oz), SpO2 96 %. No results found. Results for orders placed or performed during the hospital encounter of 08/11/14 (from the past 72 hour(s))  CBC     Status: Abnormal   Collection Time: 08/15/14 11:40 AM  Result Value Ref Range   WBC 6.6 4.0 - 10.5 K/uL   RBC 3.80 (L) 3.87 - 5.11 MIL/uL   Hemoglobin 9.9 (L) 12.0 - 15.0 g/dL   HCT 31.1 (L) 36.0 - 46.0 %   MCV 81.8 78.0 - 100.0 fL   MCH 26.1 26.0 - 34.0 pg   MCHC 31.8 30.0 - 36.0 g/dL   RDW 17.0 (H) 11.5 - 15.5 %   Platelets 260 150 - 400 K/uL     HEENT: Right CEA incision with swelling, dermabond, mild/mod tenderness Cardio: RRR and no murmur Resp: CTA B/L and unlabored GI: BS positive and NT, ND Extremity:  Pulses positive and No Edema Skin:   Wound C/D/I and mild erythema at borders Neuro: Alert/Oriented, Flat, Abnormal Sensory parasthesia Left hand but identifies LT, Abnormal Motor 2- Left triceps o/w 2- Left grip, LLE 4- HF, KE, ADF, right side 5/5, Abnormal FMC Ataxic/ dec FMC and Other no evidence of Left neglect Musc/Skel:   NO Left shoulder pain with ROM, tenderness over pectoralis, neg impingement Gen NAD   Assessment/Plan: 1. Functional deficits secondary to Right MCA infarct which require 3+ hours per day of interdisciplinary therapy in a comprehensive inpatient rehab setting.- husband states he was needing to help her with showering after intracranial bleed last year Physiatrist is providing close team supervision and 24 hour management of active medical problems listed below. Physiatrist and rehab team continue to assess barriers to discharge/monitor patient progress toward functional and medical goals. FIM: FIM - Bathing Bathing Steps Patient  Completed: Chest, Left Arm, Front perineal area, Abdomen, Buttocks, Right upper leg, Left upper leg Bathing: 0: Activity did not occur  FIM - Upper Body Dressing/Undressing Upper body dressing/undressing steps patient completed: Thread/unthread right sleeve of pullover shirt/dresss, Put head through opening of pull over shirt/dress Upper body dressing/undressing: 0: Activity did not occur FIM - Lower Body Dressing/Undressing Lower body dressing/undressing steps patient completed: Thread/unthread right underwear leg, Thread/unthread left underwear leg, Thread/unthread right pants leg, Thread/unthread left pants leg Lower body dressing/undressing: 0: Activity did not occur  FIM - Toileting Toileting steps completed by patient: Adjust clothing prior to toileting, Performs perineal hygiene, Adjust clothing after toileting Toileting Assistive Devices: Grab bar or rail for support Toileting: 4: Steadying assist  FIM - Radio producer Devices: Grab bars Toilet Transfers: 4-To toilet/BSC: Min A (steadying Pt. > 75%), 4-From toilet/BSC: Min A (steadying Pt. > 75%) (performed transfer w/ husband, did not toilet)  FIM - Bed/Chair Transfer Bed/Chair Transfer Assistive Devices: Bed rails Bed/Chair Transfer: 4: Sit > Supine: Min A (steadying pt. > 75%/lift 1 leg), 4: Bed > Chair or W/C: Min A (steadying Pt. > 75%), 4: Chair or W/C > Bed: Min A (steadying Pt. > 75%)  FIM - Locomotion: Wheelchair Distance: 150; Unable to use L UE and B LEs do not reach floor. Locomotion: Wheelchair: 0: Activity did not occur FIM - Locomotion: Ambulation Locomotion: Ambulation Assistive Devices: Nurse, adult, Journalist, newspaper Ambulation/Gait Assistance: 4: Min  guard Locomotion: Ambulation: 4: Travels 150 ft or more with minimal assistance (Pt.>75%)  Comprehension Comprehension Mode: Auditory Comprehension: 5-Understands basic 90% of the time/requires cueing < 10% of the  time  Expression Expression Mode: Verbal Expression: 5-Expresses basic 90% of the time/requires cueing < 10% of the time.  Social Interaction Social Interaction: 5-Interacts appropriately 90% of the time - Needs monitoring or encouragement for participation or interaction.  Problem Solving Problem Solving: 5-Solves basic 90% of the time/requires cueing < 10% of the time  Memory Memory: 4-Recognizes or recalls 75 - 89% of the time/requires cueing 10 - 24% of the time  Medical Problem List and Plan: 1. Functional deficits secondary to Right MCA infarct after CEA with left hemiparesis and sensory loss 2.  DVT Prophylaxis/Anticoagulation: Pharmaceutical: Lovenox, monitor surgical site for hematoma 3. Chronic back/knee pain/Pain Management: Will continue oxydone prn for pain management. Add Voltaren gel to left shoulder to help with shoulder pain as well as K pad for back pain.   Some of the soreness is muscular 4. H/o Depression/Mood:  Continue Zoloft and Remeron. Team to provide ego support. LCSW to follow for evaluation and support.   5. Neuropsych: This patient is capable of making decisions on her own behalf. 6. Skin/Wound Care:  Routine pressure relief measures.   7. Fluids/Electrolytes/Nutrition: Monitor I/O. 424m fluid, 30-100% meals , states she didn't like her dinner on 7/26 8. Hypotension: Monitor BP every 8 hours. On Toprol for HR control.  HR 68 this am-good 9. ABLA: Post op bleeding. will monitor for signs of bleeding. Repeat hgb 9.1>8.5>9.9 10. R-CEA:  On Lipitor,  Lovaza and ASA 11. Barrett's esophagitis: Continue  protonix bid with zofran prn for intermittent nausea. No signs of Upper GIB 12.  Hypoalb seen on CMET, cont beneprotein  LOS (Days) 5 A FACE TO FACE EVALUATION WAS PERFORMED  [name] E 08/16/2014, 8:27 AM

## 2014-08-16 NOTE — Progress Notes (Signed)
Getting back some left hand intrinsic function and left arm Right neck with some swelling but incision healing Continue to follow  [name] Hinds, MD [facility]: [phone] Pager: [phone]

## 2014-08-16 NOTE — Progress Notes (Signed)
Occupational Therapy Session Note  Patient Details  Name: [name] MRN: [id number] Date of Birth: 1940/07/16  Today's Date: 08/16/2014 OT Individual Time: 3094-0768 OT Individual Time Calculation (min): 84 min    Short Term Goals: Week 1:  OT Short Term Goal 1 (Week 1): Pt will complete upper body dressing with supervision OT Short Term Goal 2 (Week 1): Pt will bathe sitting and standing with min assist OT Short Term Goal 3 (Week 1): Pt will complete lower body dressing with mod assist OT Short Term Goal 4 (Week 1): Pt will demo ability to use LUE at diminished level during BADL OT Short Term Goal 5 (Week 1): Pt will demo ability to complete HEP for NMR of LUE with supervision  Skilled Therapeutic Interventions/Progress Updates:  Upon entering the room, pt supine in bed with no c/o pain. Husband entering the room for observation and education this session. Pt declined bathing but agreeable to dressing this session. Pt required min verbal cues for hemiplegic technique and shoe buttons added in order to increase independence with LB self care. Pt demonstrated understanding of how to don and doff shoes with use of shoe button. Pt ambulating to ADL apartment with close supervision and use of SPC. Pt standing for weight bearing through L UE while reaching into cabinets to obtain items with close supervision for balance. Pt ambulating 200' to day room in same manner and provided with self ROM home exercise program for L UE. OT demonstrated and provided handout of shoulder,elbow,wrist, and digits in all planes. Pt returning demonstrations with reps of 10 for each exercise. Pt required min verbal cues for proper technique. Pt ambulating back to room with [facility] and close supervision - min guard secondary to fatigue. Pt seated on EOB with husband present and setting up breakfast tray for pt. Call bell and all needed items within reach upon exiting the room.   Therapy Documentation Precautions:   Precautions Precautions: Fall Precaution Comments: LUE hemiparesis with subluxation and shoulder pain Restrictions Weight Bearing Restrictions: No ADL: ADL ADL Comments: see FIM  See FIM for current functional status  Therapy/Group: Individual Therapy  Phineas Semen 08/16/2014, 10:02 AM

## 2014-08-16 NOTE — Progress Notes (Signed)
Speech Language Pathology Daily Session Note  Patient Details  Name: [name] MRN: [id number] Date of Birth: November 23, 1940  Today's Date: 08/16/2014 SLP Individual Time: 0900-1000 SLP Individual Time Calculation (min): 60 min  Short Term Goals: Week 1: SLP Short Term Goal 1 (Week 1): Pt will recall 2-3 new pieces of information given functional tasks with Supervision using visual/verbal cueing. SLP Short Term Goal 2 (Week 1): Pt will provide appropriate solutions to complex problem solving situations using functional tasks (i.e. money and/or medical management, etc) with Supervision using multimodal cueing. SLP Short Term Goal 3 (Week 1): Pt will display appropriate planning and organization skills using complex functional tasks (i.e. planning holiday meal, using time managment for busy day, etc) with min A  given mulitmodal cueing.  Skilled Therapeutic Interventions: Skilled treatment session focused on cognitive goals. SLP facilitated session by providing extra time and supervision verbal cues for problem solving and organization during a moderately complex scheduling task. Patient was also supervision for transfers to and from the bed to the wheelchair. Patient left with husband present. Continue with current plan of care.    FIM:  Comprehension Comprehension Mode: Auditory Comprehension: 6-Follows complex conversation/direction: With extra time/assistive device Expression Expression Mode: Verbal Expression: 6-Expresses complex ideas: With extra time/assistive device Social Interaction Social Interaction: 6-Interacts appropriately with others with medication or extra time (anti-anxiety, antidepressant). Problem Solving Problem Solving: 5-Solves complex 90% of the time/cues < 10% of the time Memory Memory: 5-Recognizes or recalls 90% of the time/requires cueing < 10% of the time  Pain Pain Assessment Pain Assessment: No/denies pain  Therapy/Group: Individual  Therapy  Araminta Zorn 08/16/2014, 4:39 PM

## 2014-08-16 NOTE — Patient Care Conference (Signed)
Inpatient RehabilitationTeam Conference and Plan of Care Update Date: 08/16/2014   Time: 11:35 AM    Patient Name: [name]      Medical Record Number: [id number]  Date of Birth: 1940/07/08 Sex: Female         Room/Bed: 4M06C/4M06C-01 Payor Info: Payor: [employer] MEDICARE / Plan: [place] THN/NTSP / Product Type: *No Product type* /    Admitting Diagnosis: RT CVA  Admit Date/Time:  08/11/2014  8:31 PM Admission Comments: No comment available   Primary Diagnosis:  Acute right arterial ischemic stroke, middle cerebral artery (MCA) Principal Problem: Acute right arterial ischemic stroke, middle cerebral artery (MCA)  Patient Active Problem List   Diagnosis Date Noted  . Left hemiparesis 08/13/2014  . Acute right arterial ischemic stroke, middle cerebral artery (MCA) 08/11/2014  . Carotid artery stenosis, symptomatic 08/08/2014  . Carotid disease, bilateral 06/02/2014  . Coronary artery disease due to lipid rich plaque 06/02/2014  . Essential hypertension 06/02/2014  . Hyperlipidemia 06/02/2014  . Acute respiratory failure   . S/P colostomy takedown 01/24/2014  . Chronic diastolic CHF (congestive heart failure), NYHA class 2 11/10/2013  . Atypical chest pain 10/20/2013  . Malnutrition of moderate degree 09/18/2013  . SBO (small bowel obstruction) 09/17/2013  . H/O tracheostomy 09/17/2013  . Acute respiratory failure with hypoxia s/p tracheostomy 08/19/2013  . Physical deconditioning 08/19/2013  . HTN (hypertension) 08/19/2013  . Dehydration with hypernatremia 08/19/2013  . DVT of axillary vein, acute right 08/19/2013  . Anemia 08/19/2013  . Anasarca 08/14/2013  . Hypokalemia 07/31/2013  . Hypomagnesemia 07/31/2013  . Protein-calorie malnutrition, severe w/ electrolyte imbalance 07/31/2013  . Colonic obstruction due to suspected colitis; s/p colectomy/colostomy 07/29/2013  . CAD (coronary artery disease)   . Depression   . Barrett's esophagus   . Arthritis    . Other and unspecified angina pectoris 12/20/2012  . Chest pain 12/14/2012  . TOBACCO ABUSE 11/08/2009  . Hyperlipidemia, mixed 04/17/2008  . Carotid artery disease 04/17/2008    Expected Discharge Date: Expected Discharge Date: 08/22/14  Team Members Present: Physician leading conference: Dr. Alysia Penna Social Worker Present: Ovidio Kin, LCSW Nurse Present: Heather Roberts, RN PT Present: Cameron Sprang, PT OT Present: Benay Pillow, OT SLP Present: Windell Moulding, SLP PPS Coordinator present : Daiva Nakayama, RN, CRRN     Current Status/Progress Goal Weekly Team Focus  Medical   now using [facility], going on outing, poor fluid intake  home Sup level with adequate intake  D/C planning   Bowel/Bladder   Continent of bowel and bladder         Swallow/Nutrition/ Hydration     NA        ADL's   set up grooming and UB, mod A LB self care, ambulation with SPC and min guard  mod I - min A  pt/family education, self care training, dynamic standing balance, L NMR   Mobility   supervision-mod I with use of SPC  supervision-mod I  L NMR, dynamic standing balance, activity tolerance, pt/family education   Communication     NA        Safety/Cognition/ Behavioral Observations  min assist-supervision   supervision   higher level functional problem solving, family education for compensatory strategy use    Pain   (L) shoulder Voltaren gel in use  Assess and monitor pain prior to therapy sessions  Apply Voltaren gel as scheduled   Skin   Open blister groin area allevyn dressing in place  Continue to assess skin  Encourage patient to turn q 2 hrs      *See Care Plan and progress notes for long and short-term goals.  Barriers to Discharge: intake    Possible Resolutions to Barriers:  see above    Discharge Planning/Teaching Needs:  Home with husband who has provided care to pt before with other health issues-always here and participates in therapies      Team Discussion:  Goals  supervision with cueing, doing well may surpass these goals-outing today to see how does in the community. Poor po intake encourage her to drink and eat. Swelling in incision-may take time to go down. Husband here daily to attend therapies with pt.  Revisions to Treatment Plan:  None   Continued Need for Acute Rehabilitation Level of Care: The patient requires daily medical management by a physician with specialized training in physical medicine and rehabilitation for the following conditions: Daily direction of a multidisciplinary physical rehabilitation program to ensure safe treatment while eliciting the highest outcome that is of practical value to the patient.: Yes Daily medical management of patient stability for increased activity during participation in an intensive rehabilitation regime.: Yes Daily analysis of laboratory values and/or radiology reports with any subsequent need for medication adjustment of medical intervention for : Neurological problems;Other  Tayanna Talford, Gardiner Rhyme 08/18/2014, 11:35 AM

## 2014-08-16 NOTE — Progress Notes (Signed)
Social Work Patient ID: [name], female   DOB: 1940-03-10, 74 y.o.   MRN: [id number] Met with pt and husband to inform team conference goals-mod/i level and discharge 8/2. Pt feels she will be ready by then and husband is very pleased with this. Pt is going on an outing today and will se how she doe sin the community. Discussed follow up therapies she has had [facility] before and would like to use them again. Has all equipment. Will continue to work toward discharge date.

## 2014-08-16 NOTE — Progress Notes (Signed)
Physical Therapy Session Note  Patient Details  Name: [name] MRN: [id number] Date of Birth: 1940-05-23  Today's Date: 08/16/2014 PT Individual Time: 1300-1415 PT Individual Time Calculation (min): 75 min   Short Term Goals: Week 1:  PT Short Term Goal 1 (Week 1): Patient will perform bed mobility with minA from flat surface. PT Short Term Goal 2 (Week 1): Patient will perform stand pivot transfers without AD and supervision. PT Short Term Goal 3 (Week 1): Patient will ambulate 100' without AD and minA. PT Short Term Goal 4 (Week 1): Patient will negotiate one curb step with appropriate AD/HHA with minA to simulate STE home.  Skilled Therapeutic Interventions/Progress Updates:   Patient participated in community reintegration/outing with recreational therapist to the grocery store at overall close supervision while pushing the grocery cart and min guard/A with use of SPC in controlled and community environments x > 500 ft. Patient required max multimodal cues to attend to LUE in order to push cart/attend to left for obstacle negotiation. Patient did not require any seated rest breaks during outing. Upon returning to rehab unit, patient used LUE as stabilizer on counter while putting groceries away in cabinets in ADL kitchen. See outing goal sheet for goals and further details.   Therapy Documentation Precautions:  Precautions Precautions: Fall Precaution Comments: LUE hemiparesis with subluxation and shoulder pain Restrictions Weight Bearing Restrictions: No Pain:  Denies pain Locomotion : Ambulation Ambulation/Gait Assistance: 4: Min guard;5: Supervision   See FIM for current functional status  Therapy/Group: Individual Therapy  Laretta Alstrom 08/16/2014, 2:37 PM

## 2014-08-17 ENCOUNTER — Inpatient Hospital Stay (HOSPITAL_COMMUNITY): Payer: Commercial Managed Care - HMO | Admitting: Physical Therapy

## 2014-08-17 ENCOUNTER — Inpatient Hospital Stay (HOSPITAL_COMMUNITY): Payer: Commercial Managed Care - HMO | Admitting: Speech Pathology

## 2014-08-17 ENCOUNTER — Inpatient Hospital Stay (HOSPITAL_COMMUNITY): Payer: Commercial Managed Care - HMO | Admitting: Occupational Therapy

## 2014-08-17 LAB — PREALBUMIN: Prealbumin: 18.3 mg/dL (ref 18–38)

## 2014-08-17 NOTE — Progress Notes (Signed)
Occupational Therapy Session Note  Patient Details  Name: [name] MRN: [id number] Date of Birth: May 13, 1940  Today's Date: 08/17/2014 OT Individual Time: 1030-1200 OT Individual Time Calculation (min): 90 min    Short Term Goals: Week 1:  OT Short Term Goal 1 (Week 1): Pt will complete upper body dressing with supervision OT Short Term Goal 2 (Week 1): Pt will bathe sitting and standing with min assist OT Short Term Goal 3 (Week 1): Pt will complete lower body dressing with mod assist OT Short Term Goal 4 (Week 1): Pt will demo ability to use LUE at diminished level during BADL OT Short Term Goal 5 (Week 1): Pt will demo ability to complete HEP for NMR of LUE with supervision  Skilled Therapeutic Interventions/Progress Updates:    Pt seen for 1:1 OT session with a focus on functional ambulation, L NMR, standing balance, functional endurance, patient/family education, and safety awareness. Pt received supine in bed with no c/o of pain. Pt completed bed mobility to EOB with supervision to don/doff shoes. Husband insisting on assisting, and pt/husband educated on pt independence level. Pt ambulating to hospital lobby via [place] and min-guard-SBA. Pt engaged in functional ambulation on a variety of surfaces: uneven surface, slopping surface, and steps with min guard. Pt completed stair negotiation 2x with mod cues for technique and min-guard for ascension and min A for descending stairs. Overall, pt demonstrated improved functional endurance during ambulation >500' at a time before requiring rest break. Pt then transitioned to L NMR activities with standing while weightbearing in LUE and functional reach with RUE with intermittent rest breaks. Pt then engaged in L NMR in tall-kneel position and functional reach to L side to increase w/b in LUE. Pt required min A for stability in tall-kneel position. Pt then ambulated to day room with SBA-min-guard and completed return demonstration of pt self  ROM HEP: shoulder, elbow, and digit exercises. Pt required min cues for appropriate technique during activities. Pt then engaged in "memory" card activity in standing with weight-bearing in LUE with SBA. Pt ambulated back to room via [facility] with close supervision. Pt left seated EOB with husband present and all other needs within reach.    Therapy Documentation Precautions:  Precautions Precautions: Fall Precaution Comments: LUE hemiparesis with subluxation and shoulder pain Restrictions Weight Bearing Restrictions: No General:   Vital Signs:  Pain:   ADL: ADL ADL Comments: see FIM Exercises:   Other Treatments:    See FIM for current functional status  Therapy/Group: Individual Therapy  Dorann Ou 08/17/2014, 10:24 AM

## 2014-08-17 NOTE — Progress Notes (Signed)
Subjective/Complaints: Took shower with husband this am, (oked per PT/OT) Review of Systems - no problem with bowels or bladder, no SOB Objective: Vital Signs: Blood pressure 156/59, pulse 70, temperature 98.5 F (36.9 C), temperature source Oral, resp. rate 18, weight 82.4 kg (181 lb 10.5 oz), SpO2 97 %. No results found. Results for orders placed or performed during the hospital encounter of 08/11/14 (from the past 72 hour(s))  CBC     Status: Abnormal   Collection Time: 08/15/14 11:40 AM  Result Value Ref Range   WBC 6.6 4.0 - 10.5 K/uL   RBC 3.80 (L) 3.87 - 5.11 MIL/uL   Hemoglobin 9.9 (L) 12.0 - 15.0 g/dL   HCT 31.1 (L) 36.0 - 46.0 %   MCV 81.8 78.0 - 100.0 fL   MCH 26.1 26.0 - 34.0 pg   MCHC 31.8 30.0 - 36.0 g/dL   RDW 17.0 (H) 11.5 - 15.5 %   Platelets 260 150 - 400 K/uL     HEENT: Right CEA incision with swelling, dermabond, mild/mod tenderness Cardio: RRR and no murmur Resp: CTA B/L and unlabored GI: BS positive and NT, ND Extremity:  Pulses positive and No Edema Skin:   Wound C/D/I and mild erythema at borders Neuro: Alert/Oriented, Flat, Abnormal Sensory parasthesia Left hand but identifies LT, Abnormal Motor 2- Left triceps , 2- Biceps, 2- Left grip, LLE 4- HF, KE, ADF, right side 5/5, Abnormal FMC Ataxic/ dec FMC and Other no evidence of Left neglect Musc/Skel:   NO Left shoulder pain with ROM, No tenderness over pectoralis, neg impingement Gen NAD   Assessment/Plan: 1. Functional deficits secondary to Right MCA infarct which require 3+ hours per day of interdisciplinary therapy in a comprehensive inpatient rehab setting.- husband states he was needing to help her with showering after intracranial bleed last year Physiatrist is providing close team supervision and 24 hour management of active medical problems listed below. Physiatrist and rehab team continue to assess barriers to discharge/monitor patient progress toward functional and medical goals. FIM: FIM -  Bathing Bathing Steps Patient Completed: Chest, Left Arm, Front perineal area, Abdomen, Buttocks, Right upper leg, Left upper leg Bathing: 0: Activity did not occur  FIM - Upper Body Dressing/Undressing Upper body dressing/undressing steps patient completed: Thread/unthread right sleeve of pullover shirt/dresss, Put head through opening of pull over shirt/dress, Pull shirt over trunk Upper body dressing/undressing: 4: Min-Patient completed 75 plus % of tasks FIM - Lower Body Dressing/Undressing Lower body dressing/undressing steps patient completed: Thread/unthread right pants leg, Thread/unthread left pants leg, Pull pants up/down Lower body dressing/undressing: 3: Mod-Patient completed 50-74% of tasks  FIM - Toileting Toileting steps completed by patient: Adjust clothing prior to toileting, Performs perineal hygiene, Adjust clothing after toileting Toileting Assistive Devices: Grab bar or rail for support Toileting: 4: Steadying assist  FIM - Radio producer Devices: Grab bars Toilet Transfers: 4-To toilet/BSC: Min A (steadying Pt. > 75%), 4-From toilet/BSC: Min A (steadying Pt. > 75%)  FIM - Control and instrumentation engineer Devices: Best boy: 4: Bed > Chair or W/C: Min A (steadying Pt. > 75%), 4: Chair or W/C > Bed: Min A (steadying Pt. > 75%)  FIM - Locomotion: Wheelchair Distance: 150; Unable to use L UE and B LEs do not reach floor. Locomotion: Wheelchair: 1: Total Assistance/staff pushes wheelchair (Pt<25%) FIM - Locomotion: Ambulation Locomotion: Ambulation Assistive Devices: Journalist, newspaper, Other (comment) (grocery cart) Ambulation/Gait Assistance: 4: Min guard, 5: Supervision Locomotion: Ambulation: 4: Travels  150 ft or more with minimal assistance (Pt.>75%)  Comprehension Comprehension Mode: Auditory Comprehension: 5-Understands basic 90% of the time/requires cueing < 10% of the time  Expression Expression  Mode: Verbal Expression: 5-Expresses basic 90% of the time/requires cueing < 10% of the time.  Social Interaction Social Interaction: 6-Interacts appropriately with others with medication or extra time (anti-anxiety, antidepressant).  Problem Solving Problem Solving: 5-Solves basic 90% of the time/requires cueing < 10% of the time  Memory Memory: 5-Recognizes or recalls 90% of the time/requires cueing < 10% of the time  Medical Problem List and Plan: 1. Functional deficits secondary to Right MCA infarct after CEA with left hemiparesis and sensory loss, discussed progress with therapy and pt,  Pt was hoping for D/C this week but discussed potential of going home with LUE as gross assist 2.  DVT Prophylaxis/Anticoagulation: Pharmaceutical: Lovenox, monitor surgical site for hematoma 3. Chronic back/knee pain/Pain Management: Will continue oxydone prn for pain management. Add Voltaren gel to left shoulder to help with shoulder pain as well as K pad for back pain.    4. H/o Depression/Mood:  Continue Zoloft and Remeron. Team to provide ego support. LCSW to follow for evaluation and support.   5. Neuropsych: This patient is capable of making decisions on her own behalf. 6. Skin/Wound Care:  Routine pressure relief measures.   7. Fluids/Electrolytes/Nutrition: Monitor I/O. 272m fluid, 80-90% meals , 8. Hypotension: Monitor BP every 8 hours. On Toprol for HR control.  HR 68 this am-good 9. ABLA: Post op bleeding. will monitor for signs of bleeding. Repeat hgb 9.1>8.5>9.9 10. R-CEA:  On Lipitor,  Lovaza and ASA 11. Barrett's esophagitis: Continue  protonix bid with zofran prn for intermittent nausea.  12.  Hypoalb seen on CMET, cont beneprotein, monitor pre alb LOS (Days) 6 A FACE TO FACE EVALUATION WAS PERFORMED  Mandi Mattioli E 08/17/2014, 7:46 AM

## 2014-08-17 NOTE — Progress Notes (Signed)
Physical Therapy Session Note  Patient Details  Name: [name] MRN: [id number] Date of Birth: 08-11-1940  Today's Date: 08/17/2014 PT Individual Time: 1600-1700 PT Individual Time Calculation (min): 60 min   Short Term Goals: Week 1:  PT Short Term Goal 1 (Week 1): Patient will perform bed mobility with minA from flat surface. PT Short Term Goal 2 (Week 1): Patient will perform stand pivot transfers without AD and supervision. PT Short Term Goal 3 (Week 1): Patient will ambulate 100' without AD and minA. PT Short Term Goal 4 (Week 1): Patient will negotiate one curb step with appropriate AD/HHA with minA to simulate STE home.  Skilled Therapeutic Interventions/Progress Updates:   Session focused on dynamic balance, activity tolerance, problem solving, and attention to left. Patient participated in familiar baking task in ADL kitchen with supervision and mod multimodal cues for safety and attention to [place] environment, especially around oven and hot baking dish. Patient required max cues to utilize LUE as stabilizer when reaching in cabinets for objects or mixing items on counter. Patient able to complete task without any seated rest breaks. Patient left sitting on bed with needs within reach and husband present.    Therapy Documentation Precautions:  Precautions Precautions: Fall Precaution Comments: LUE hemiparesis with subluxation and shoulder pain Restrictions Weight Bearing Restrictions: No Pain: Pain Assessment Pain Assessment: No/denies pain Locomotion : Ambulation Ambulation/Gait Assistance: 5: Supervision   See FIM for current functional status  Therapy/Group: Individual Therapy  Laretta Alstrom 08/17/2014, 5:50 PM

## 2014-08-17 NOTE — Progress Notes (Signed)
Speech Language Pathology Session Note & Discharge Summary  Patient Details  Name: MALKY RUDZINSKI MRN: [id number] Date of Birth: Dec 07, 1940  Today's Date: 08/17/2014 SLP Individual Time: 1430-1515 SLP Individual Time Calculation (min): 45 min   Skilled Therapeutic Interventions:  Skilled treatment session focused on cognitive goals. Upon arrival, patient was supine in bed and agreeable to participate in treatment session. Patient ambulated to SLP office using a single point cane with supervision. Patient was re-administered the MoCA and scored a 27/30 points with a score of 26 or above considered normal. Both the patient and her husband report patient is at her cognitive baseline, therefore, she will be discharged from SLP caseload. Both verbalized understanding of all information and are in agreement. Patient left supine in bed with husband present.  Patient has met 5 of 5 long term goals.  Patient to discharge at overall Modified Independent level.   Reasons goals not met: N/A   Clinical Impression/Discharge Summary: Patient has made functional gains and has met 5 of 5 LTG's this admission due to increased attention, problem solving, working memory and awareness. Currently, patient is overall Mod I for completing functional and familiar tasks safely in regards to cognitive function. Both the patient and her husband she is at her cognitive baseline, therefore, patient will be discharged from skilled SLP intervention and f/u is not warranted at this time.   Care Partner:  Caregiver Able to Provide Assistance: Yes  Type of Caregiver Assistance: Physical  Recommendation:  None      Equipment: N/A   Reasons for discharge: Treatment goals met   Patient/Family Agrees with Progress Made and Goals Achieved: Yes   See FIM for current functional status  Maegen Wigle 08/17/2014, 3:41 PM

## 2014-08-18 ENCOUNTER — Inpatient Hospital Stay (HOSPITAL_COMMUNITY): Payer: Commercial Managed Care - HMO | Admitting: Physical Therapy

## 2014-08-18 ENCOUNTER — Inpatient Hospital Stay (HOSPITAL_COMMUNITY): Payer: Commercial Managed Care - HMO | Admitting: Occupational Therapy

## 2014-08-18 LAB — CREATININE, SERUM: CREATININE: 0.65 mg/dL (ref 0.44–1.00)

## 2014-08-18 MED ORDER — POLYSACCHARIDE IRON COMPLEX 150 MG PO CAPS: 150.0000 mg | ORAL_CAPSULE | Freq: Two times a day (BID) | ORAL | Status: DC

## 2014-08-18 MED ORDER — OXYCODONE HCL 5 MG PO TABS: 5.0000 mg | ORAL_TABLET | Freq: Four times a day (QID) | ORAL | Status: DC | PRN

## 2014-08-18 NOTE — Progress Notes (Signed)
Subjective/Complaints:  No isues overnite, moving Left hand more Review of Systems - no problem with bowels or bladder, no SOB Objective: Vital Signs: Blood pressure 126/45, pulse 68, temperature 98.6 F (37 C), temperature source Oral, resp. rate 18, weight 82.4 kg (181 lb 10.5 oz), SpO2 96 %. No results found. Results for orders placed or performed during the hospital encounter of 08/11/14 (from the past 72 hour(s))  CBC     Status: Abnormal   Collection Time: 08/15/14 11:40 AM  Result Value Ref Range   WBC 6.6 4.0 - 10.5 K/uL   RBC 3.80 (L) 3.87 - 5.11 MIL/uL   Hemoglobin 9.9 (L) 12.0 - 15.0 g/dL   HCT 45.3 (L) 06.2 - 38.0 %   MCV 81.8 78.0 - 100.0 fL   MCH 26.1 26.0 - 34.0 pg   MCHC 31.8 30.0 - 36.0 g/dL   RDW 87.6 (H) 09.6 - 07.4 %   Platelets 260 150 - 400 K/uL  Prealbumin     Status: None   Collection Time: 08/17/14  9:11 AM  Result Value Ref Range   Prealbumin 18.3 18 - 38 mg/dL  Creatinine, serum     Status: None   Collection Time: 08/18/14  4:19 AM  Result Value Ref Range   Creatinine, Ser 0.65 0.44 - 1.00 mg/dL   GFR calc non Af Amer >60 >60 mL/min   GFR calc Af Amer >60 >60 mL/min    Comment: (NOTE) The eGFR has been calculated using the CKD EPI equation. This calculation has not been validated in all clinical situations. eGFR's persistently <60 mL/min signify possible Chronic Kidney Disease.      HEENT: Right CEA incision with swelling, dermabond, mild/mod tenderness Cardio: RRR and no murmur Resp: CTA B/L and unlabored GI: BS positive and NT, ND Extremity:  Pulses positive and No Edema Skin:   Wound C/D/I and mild erythema at borders Neuro: Alert/Oriented, Flat, Abnormal Sensory parasthesia Left hand but identifies LT, Abnormal Motor 3- Left triceps , 3- Biceps, 3- Left grip, LLE 4- HF, KE, ADF, right side 5/5, Abnormal FMC Ataxic/ dec FMC and Other no evidence of Left neglect Musc/Skel:   NO Left shoulder pain with ROM, No tenderness over pectoralis,  neg impingement Gen NAD   Assessment/Plan: 1. Functional deficits secondary to Right MCA infarct which require 3+ hours per day of interdisciplinary therapy in a comprehensive inpatient rehab setting.- husband states he was needing to help her with showering after intracranial bleed last year Physiatrist is providing close team supervision and 24 hour management of active medical problems listed below. Physiatrist and rehab team continue to assess barriers to discharge/monitor patient progress toward functional and medical goals. FIM: FIM - Bathing Bathing Steps Patient Completed: Chest, Left Arm, Front perineal area, Abdomen, Buttocks, Right upper leg, Left upper leg Bathing: 0: Activity did not occur  FIM - Upper Body Dressing/Undressing Upper body dressing/undressing steps patient completed: Thread/unthread right sleeve of pullover shirt/dresss, Put head through opening of pull over shirt/dress, Pull shirt over trunk Upper body dressing/undressing: 4: Min-Patient completed 75 plus % of tasks FIM - Lower Body Dressing/Undressing Lower body dressing/undressing steps patient completed: Thread/unthread right pants leg, Thread/unthread left pants leg, Pull pants up/down Lower body dressing/undressing: 3: Mod-Patient completed 50-74% of tasks  FIM - Toileting Toileting steps completed by patient: Adjust clothing prior to toileting, Performs perineal hygiene, Adjust clothing after toileting Toileting Assistive Devices: Grab bar or rail for support Toileting: 4: Steadying assist  FIM - Toilet Transfers  Chief of Staff Devices: Product manager Transfers: 4-To toilet/BSC: Min A (steadying Pt. > 75%), 4-From toilet/BSC: Min A (steadying Pt. > 75%)  FIM - Control and instrumentation engineer Devices: Best boy: 4: Supine > Sit: Min A (steadying Pt. > 75%/lift 1 leg), 5: Bed > Chair or W/C: Supervision (verbal cues/safety issues), 5: Chair or W/C > Bed:  Supervision (verbal cues/safety issues)  FIM - Locomotion: Wheelchair Distance: 150; Unable to use L UE and B LEs do not reach floor. Locomotion: Wheelchair: 0: Activity did not occur FIM - Locomotion: Ambulation Locomotion: Ambulation Assistive Devices: Journalist, newspaper Ambulation/Gait Assistance: 5: Supervision Locomotion: Ambulation: 5: Travels 150 ft or more with supervision/safety issues  Comprehension Comprehension Mode: Auditory Comprehension: 5-Understands basic 90% of the time/requires cueing < 10% of the time  Expression Expression Mode: Verbal Expression: 5-Expresses basic 90% of the time/requires cueing < 10% of the time.  Social Interaction Social Interaction: 6-Interacts appropriately with others with medication or extra time (anti-anxiety, antidepressant).  Problem Solving Problem Solving: 5-Solves basic 90% of the time/requires cueing < 10% of the time  Memory Memory: 5-Recognizes or recalls 90% of the time/requires cueing < 10% of the time  Medical Problem List and Plan: 1. Functional deficits secondary to Right MCA infarct after CEA with left hemiparesis and sensory loss, discussed progress with therapy and pt,  Pt was hoping for D/C this week but discussed potential of going home with LUE as gross assist 2.  DVT Prophylaxis/Anticoagulation: Pharmaceutical: Lovenox, monitor surgical site for hematoma 3. Chronic back/knee pain/Pain Management: Will continue oxydone prn for pain management. No c/o today   4. H/o Depression/Mood:  Continue Zoloft and Remeron. Team to provide ego support. LCSW to follow for evaluation and support.   5. Neuropsych: This patient is capable of making decisions on her own behalf. 6. Skin/Wound Care:  Routine pressure relief measures.   7. Fluids/Electrolytes/Nutrition: Monitor I/O. 640ml fluid, 50-75% meals , 8. Hypotension: Monitor BP every 8 hours. On Toprol for HR control.  HR 68 this am-good 9. ABLA: Post op bleeding. will monitor for  signs of bleeding. Repeat hgb 9.1>8.5>9.9 10. R-CEA:  On Lipitor,  Lovaza and ASA 11. Barrett's esophagitis: Continue  protonix bid with zofran prn for intermittent nausea.  12.  Hypoalb seen on CMET, cont beneprotein, monitor pre alb LOS (Days) 7 A FACE TO FACE EVALUATION WAS PERFORMED  KIRSTEINS,ANDREW E 08/18/2014, 7:22 AM

## 2014-08-18 NOTE — Progress Notes (Signed)
Physical Therapy Session Note  Patient Details  Name: [name] MRN: [id number] Date of Birth: [birth year]  Today's Date: 08/18/2014 PT Individual Time: 0900-1000 PT Individual Time Calculation (min): 60 min   Short Term Goals: Week 1:  PT Short Term Goal 1 (Week 1): Patient will perform bed mobility with minA from flat surface. PT Short Term Goal 2 (Week 1): Patient will perform stand pivot transfers without AD and supervision. PT Short Term Goal 3 (Week 1): Patient will ambulate 100' without AD and minA. PT Short Term Goal 4 (Week 1): Patient will negotiate one curb step with appropriate AD/HHA with minA to simulate STE home.  Skilled Therapeutic Interventions/Progress Updates:  Session focused on functional mobillity and balance. Pt was overall mod I for bed mobility in regular bed in ADL apartment and for stand pivot transfers without AD and without armrests. Pt was supervision for ambulation with cane up to 150 feet multiple trials. Pt was supervision for negotiating up/down 12 6 inch stairs with cane, no handrails, step to pattern, ascending facing forward and descending sideways leading with R LE. Pt cued to lead with stronger L LE but pt appeared less steady and reported she "liked it better the other way." Berg improved to score 46/56 from 10/56 on 08/12/14, see details below. Provided safety recommendations to husband who verbalized understanding. Pt returned to room and left sitting EOB with husband present and all needs in reach.  Therapy Documentation Precautions:  Precautions Precautions: Fall Precaution Comments: LUE hemiparesis Restrictions Weight Bearing Restrictions: No   Pain: Pain Assessment Pain Assessment: No/denies pain  Balance: Balance Balance Assessed: Yes Standardized Balance Assessment Standardized Balance Assessment: Berg Balance Test Berg Balance Test Sit to Stand: Able to stand without using hands and stabilize independently Standing  Unsupported: Able to stand safely 2 minutes Sitting with Back Unsupported but Feet Supported on Floor or Stool: Able to sit safely and securely 2 minutes Stand to Sit: Sits safely with minimal use of hands Transfers: Able to transfer safely, minor use of hands Standing Unsupported with Eyes Closed: Able to stand 10 seconds safely Standing Ubsupported with Feet Together: Able to place feet together independently and stand for 1 minute with supervision From Standing, Reach Forward with Outstretched Arm: Can reach confidently >25 cm (10") From Standing Position, Pick up Object from Floor: Able to pick up shoe safely and easily From Standing Position, Turn to Look Behind Over each Shoulder: Looks behind one side only/other side shows less weight shift Turn 360 Degrees: Able to turn 360 degrees safely but slowly Standing Unsupported, Alternately Place Feet on Step/Stool: Able to stand independently and complete 8 steps >20 seconds Standing Unsupported, One Foot in Front: Needs help to step but can hold 15 seconds Standing on One Leg: Able to lift leg independently and hold equal to or more than 3 seconds Total Score: 46 Static Standing Balance Static Standing - Balance Support: During functional activity;No upper extremity supported Static Standing - Level of Assistance: 6: Modified independent (Device/Increase time) Dynamic Standing Balance Dynamic Standing - Balance Support: During functional activity;No upper extremity supported Dynamic Standing - Level of Assistance: 5: Stand by assistance  See FIM for current functional status  Therapy/Group: Individual Therapy  Elsie Ra 08/18/2014, 12:21 PM

## 2014-08-18 NOTE — Progress Notes (Signed)
Occupational Therapy Session Note  Patient Details  Name: [name] MRN: [id number] Date of Birth: [birth year]  Today's Date: 08/18/2014 OT Individual Time: 1430-1455 OT Individual Time Calculation (min): 25 min    Short Term Goals: Week 1:  OT Short Term Goal 1 (Week 1): Pt will complete upper body dressing with supervision OT Short Term Goal 2 (Week 1): Pt will bathe sitting and standing with min assist OT Short Term Goal 3 (Week 1): Pt will complete lower body dressing with mod assist OT Short Term Goal 4 (Week 1): Pt will demo ability to use LUE at diminished level during BADL OT Short Term Goal 5 (Week 1): Pt will demo ability to complete HEP for NMR of LUE with supervision  Skilled Therapeutic Interventions/Progress Updates:  Upon entering the room, pt supine in bed with husband present and no c/o pain. Supine >sit with supervision. Pt ambulated to bathroom with supervision and use of SPC. Toilet transfer with supervision as well. Pt did not need to void this session. Pt returning to sit on EOB. Therapist provided paper handout of previously discussed weight bearing strategies in sitting and standing. Handout also provided regarding using L UE as a stabilizer for functional tasks. Pt and husband verbalized understanding. OT also going over HEP with pt demonstrating exercises with min verbal cues for proper technique. Pt remained seated on EOB with call bell and all needed items within reach upon exiting the room.   Therapy Documentation Precautions:  Precautions Precautions: Fall Precaution Comments: LUE hemiparesis Restrictions Weight Bearing Restrictions: No Vital Signs: Therapy Vitals Temp: 98 F (36.7 C) Temp Source: Oral Pulse Rate: 71 Resp: 18 BP: (!) 114/43 mmHg Patient Position (if appropriate): Lying Oxygen Therapy SpO2: 99 % O2 Device: Not Delivered Pain: Pain Assessment Pain Assessment: No/denies pain ADL: ADL ADL Comments: see FIM  See FIM for  current functional status  Therapy/Group: Individual Therapy  [name] 08/18/2014, 3:01 PM

## 2014-08-18 NOTE — Discharge Instructions (Signed)
Inpatient Rehab Discharge Instructions  [name] Discharge date and time:    Activities/Precautions/ Functional Status: Activity: activity as tolerated Diet: cardiac diet Wound Care: none needed Functional status:  ___ No restrictions     ___ Walk up steps independently ___ 24/7 supervision/assistance   ___ Walk up steps with assistance ___ Intermittent supervision/assistance  ___ Bathe/dress independently ___ Walk with walker     ___ Bathe/dress with assistance ___ Walk Independently    ___ Shower independently ___ Walk with assistance    ___ Shower with assistance ___ No alcohol     ___ Return to work/school ________   Special Instructions:    COMMUNITY REFERRALS UPON DISCHARGE:    Outpatient: PT & OT  [place]   Date of Last Service:08/19/2014  Appointment Date/Time: AUGUST 3 Wednesday 1;15-3;30 PM  Medical Equipment/Items Ordered:CANE AGENCY: [facility]    [phone]  GENERAL COMMUNITY RESOURCES FOR PATIENT/FAMILY: Support Groups:[facility] SUPPORT GROUP  STROKE/TIA DISCHARGE INSTRUCTIONS SMOKING Cigarette smoking nearly doubles your risk of having a stroke & is the single most alterable risk factor  If you smoke or have smoked in the last 12 months, you are advised to quit smoking for your health.  Most of the excess cardiovascular risk related to smoking disappears within a year of stopping.  Ask you doctor about anti-smoking medications  [place] Quit Line: 1-800-QUIT NOW  Free Smoking Cessation Classes [phone]  CHOLESTEROL Know your levels; limit fat & cholesterol in your diet  Lipid Panel     Component Value Date/Time   CHOL 95 08/09/2014 0400   TRIG 97 08/09/2014 0400   HDL 36* 08/09/2014 0400   CHOLHDL 2.6 08/09/2014 0400   VLDL 19 08/09/2014 0400   LDLCALC 40 08/09/2014 0400      Many patients benefit from treatment even if their cholesterol is at goal.  Goal: Total Cholesterol  (CHOL) less than 160  Goal:  Triglycerides (TRIG) less than 150  Goal:  HDL greater than 40  Goal:  LDL (LDLCALC) less than 100   BLOOD PRESSURE American Stroke Association blood pressure target is less that 120/80 mm/Hg  Your discharge blood pressure is:  BP: (!) 130/56 mmHg  Monitor your blood pressure  Limit your salt and alcohol intake  Many individuals will require more than one medication for high blood pressure  DIABETES (A1c is a blood sugar average for last 3 months) Goal HGBA1c is under 7% (HBGA1c is blood sugar average for last 3 months)  Diabetes:     Lab Results  Component Value Date   HGBA1C 6.2* 08/09/2014     Your HGBA1c can be lowered with medications, healthy diet, and exercise.  Check your blood sugar as directed by your physician  Call your physician if you experience unexplained or low blood sugars.  PHYSICAL ACTIVITY/REHABILITATION Goal is 30 minutes at least 4 days per week  Activity: No driving, Therapies:  See above Return to work:   Activity decreases your risk of heart attack and stroke and makes your heart stronger.  It helps control your weight and blood pressure; helps you relax and can improve your mood.  Participate in a regular exercise program.  Talk with your doctor about the best form of exercise for you (dancing, walking, swimming, cycling).  DIET/WEIGHT Goal is to maintain a healthy weight  Your discharge diet is: Diet Heart Room service appropriate?: Yes; Fluid consistency:: Thin  liquids Your height is:  5' Your current weight is: Weight:  83 kg (182 lb 15.7 oz) Your Body Mass Index (BMI) is:  29.7  Following the type of diet specifically designed for you will help prevent another stroke.  Your goal weight is: 127  Your goal Body Mass Index (BMI) is 19-24.  Healthy food habits can help reduce 3 risk factors for stroke:  High cholesterol, hypertension, and excess weight.  RESOURCES Stroke/Support Group:  Call [phone]     STROKE EDUCATION PROVIDED/REVIEWED AND GIVEN TO PATIENT Stroke warning signs and symptoms How to activate emergency medical system (call 911). Medications prescribed at discharge. Need for follow-up after discharge. Personal risk factors for stroke. Pneumonia vaccine given:  Flu vaccine given:  My questions have been answered, the writing is legible, and I understand these instructions.  I will adhere to these goals & educational materials that have been provided to me after my discharge from the hospital.      My questions have been answered and I understand these instructions. I will adhere to these goals and the provided educational materials after my discharge from the hospital.  Patient/Caregiver Signature _______________________________ Date __________  Clinician Signature _______________________________________ Date __________  Please bring this form and your medication list with you to all your follow-up doctor's appointments.

## 2014-08-18 NOTE — Plan of Care (Signed)
Problem: RH Bathing Goal: LTG Patient will bathe with assist, cues/equipment (OT) LTG: Patient will bathe specified number of body parts with assist with/without cues using equipment (position) (OT)  Outcome: Not Applicable Date Met:  01/60/10 Pt wishes to discontinue goal as she wants husband to assist with this task. Education has been provided yet pt refuses.   Problem: RH Dressing Goal: LTG Patient will perform upper body dressing (OT) LTG Patient will perform upper body dressing with assist, with/without cues (OT).  Downgraded secondary to pt progress and willingness to work towards goal  Problem: RH Functional Use of Upper Extremity Goal: LTG Patient will use RT/LT upper extremity as a (OT) LTG: Patient will use right/left upper extremity as a stabilizer/gross assist/diminished/nondominant/dominant level with assist, with/without cues during functional activity (OT)  Downgraded secondary to pt progress  Problem: RH Light Housekeeping Goal: LTG Patient will perform light housekeeping w/assist (OT) LTG: Patient will perform light housekeeping with assistance, with/without cues (OT).  Outcome: Not Applicable Date Met:  93/23/55 Pt wishes to discontinue secondary to no longer pt focus

## 2014-08-18 NOTE — Progress Notes (Signed)
Social Work Patient ID: [name], female   DOB: 1940-05-25, 74 y.o.   MRN: [id number] Team feels pt is meeting her goals and want to move up discharge date to tomorrow. Checked with MD and he reports no medical issues keeping her here. Pt and husband are very pleased and ready to go home. Discussed OP therapies and both in agreement. Work on discharge for tomorrow.

## 2014-08-18 NOTE — Plan of Care (Signed)
Problem: RH Tub/Shower Transfers Goal: LTG Patient will perform tub/shower transfers w/assist (OT) LTG: Patient will perform tub/shower transfers with assist, with/without cues using equipment (OT)  Outcome: Not Applicable Date Met:  64/31/42 Discontinue goals secondary to pt request

## 2014-08-18 NOTE — Progress Notes (Signed)
Occupational Therapy Session Note  Patient Details  Name: [name] MRN: [id number] Date of Birth: 07/07/1940  Today's Date: 08/18/2014 OT Individual Time: 0700-0800 and 1000-1053 OT Individual Time Calculation (min): 60 min and 53 min   Short Term Goals: Week 1:  OT Short Term Goal 1 (Week 1): Pt will complete upper body dressing with supervision OT Short Term Goal 2 (Week 1): Pt will bathe sitting and standing with min assist OT Short Term Goal 3 (Week 1): Pt will complete lower body dressing with mod assist OT Short Term Goal 4 (Week 1): Pt will demo ability to use LUE at diminished level during BADL OT Short Term Goal 5 (Week 1): Pt will demo ability to complete HEP for NMR of LUE with supervision  Skilled Therapeutic Interventions/Progress Updates:  Session 1: Upon entering the room, pt supine in bed and fully dressed with husband present in room. Pt with no c/o pain this session. Husband reports he has been assisting pt with dressing and bathing prior to all therapy sessions. OT educated pt and spouse on OT purpose and goals. Pt wishes to discontinue bathing goals as she reports, "My husband is going to help me at home." Pt ambulating to ADL apartment ~100' with [facility] and supervision. OT educated and demonstrated pt and husband on simulated walk in shower transfer with use of shower chair. Pt returned demonstration with supervision. Bath mitt also issued this session with simulated practice for pt to understand its use to increase independence with task. Pt then ambulating to gym in same manner and seated on mat for scapular strengthening exercises 2 sets of 10 in all planes. Pt in side lying position with therapist performing PROM to L UE and educating pt and caregiver on the importance of extension of all movements. PROM shoulder,elbow,wrist,and digits x 10 reps each. Pt demonstrated the ability to perform 5 reps of elbow flexion/extension with assist from therapist to decrease  compensatory movements x 5 reps each. Pt was not previously able to perform this task. Pt returned to room and seated on edge of bed with husband setting up breakfast tray. Call bell and all needed items within reach.   Session 2: Upon entering the room, pt supine in bed awaiting therapist with supervision for supine >sit. Pt ambulating 150' without AD and close supervision. Pt having 1 LOB requiring min A to correct secondary to fatigue. Pt ambulated another 300' with SPC to front entrance with supervision. Pt ambulating over a variety of surfaces with supervision and use of SPC  this session with no further LOB. Pt seated outside and therapist educating pt on progress towards OT goals, recommendation and expectation for outpatient OT services, as well as general CVA education. Pt verbalized understanding and had no further questions. Pt returning to rehab floor with supervision and use of SPC without rest break needed. Pt seated on edge of mat with OT educating pt on functional ways to engage in weight bearing within home. Pt verbalized understanding. Pt in quadruped position and reaching out with R hand to obtain materials laterally and forwards with min A for balance. Pt then in tall kneeling position with weight bearing through L UE on bench for L lateral reaching with R UE. Pt returning to room at end of session in same manner as stated above.   Therapy Documentation Precautions:  Precautions Precautions: Fall Precaution Comments: LUE hemiparesis with subluxation and shoulder pain Restrictions Weight Bearing Restrictions: No Vital Signs: Therapy Vitals Temp: 98.6 F (37  C) Temp Source: Oral Pulse Rate: 68 Resp: 18 BP: (!) 126/45 mmHg Patient Position (if appropriate): Lying Oxygen Therapy SpO2: 96 % O2 Device: Not Delivered ADL: ADL ADL Comments: see FIM  See FIM for current functional status  Therapy/Group: Individual Therapy  Phineas Semen 08/18/2014, 9:38 AM

## 2014-08-18 NOTE — Discharge Summary (Signed)
Physician Discharge Summary  Patient ID: [name] MRN: [id number] DOB/AGE: 1940/02/09 74 y.o.  Admit date: 08/11/2014 Discharge date: 08/19/2014  Discharge Diagnoses:  Principal Problem:   Acute right arterial ischemic stroke, middle cerebral artery (MCA) Active Problems:   CAD (coronary artery disease)   Depression   Protein-calorie malnutrition, severe w/ electrolyte imbalance   Anemia   Chronic diastolic CHF (congestive heart failure), NYHA class 2   Left hemiparesis   Discharged Condition: Stable.    Labs:  Basic Metabolic Panel:  Recent Labs Lab 08/12/14 0435 08/18/14 0419  NA 141  --   K 3.5  --   CL 109  --   CO2 23  --   GLUCOSE 110*  --   BUN 16  --   CREATININE 0.60 0.65  CALCIUM 8.6*  --     CBC:  Recent Labs Lab 08/12/14 0435 08/15/14 1140  WBC 6.3 6.6  NEUTROABS 3.4  --   HGB 8.5* 9.9*  HCT 26.0* 31.1*  MCV 79.5 81.8  PLT 171 260    CBG: No results for input(s): GLUCAP in the last 168 hours.  Brief HPI:   [name] is a 74 y.o. female with history of CAD, HTN, CAS, 2-3 weeks history of intermittent LUE numbness and weakness who was admitted on 08/08/14 for R-CEA by Dr. Oneida Alar. Post procedure she developed left sided weakness and stat carotid duplex showed patent cervical ICA. CT head questioned acute R-MCA infarct and cerebral angiogram showed no occlusions or filling defects. MRI brain done revealing acute right parietal and posterior frontal lobe infarcts. 2D echo revealed EF 65-70% and no wall abnormality. Neurology felt that patient with thromboembolic stroke post op and to continue ASA 162 mg daily. Patient with resultant mild dysarthria, left sided weakness, higher level cognitive deficits. Has been weaned off dopamine and ABLA being monitored. CIR was recommended for follow up therapy   Hospital Course: [name] was admitted to rehab 08/11/2014 for inpatient therapies to consist of PT, ST and OT at least  three hours five days a week. Past admission physiatrist, therapy team and rehab RN have worked together to provide customized collaborative inpatient rehab. Blood pressures have been monitored on bid basis and hypotension has resolved. Serial CBC shows improvement in ABLA. Intermittent nausea has resolved with improvement in po intake.  Team has provided ego support to help with high levels of anxiety as well as acute adjustment reaction.  R-CEA incision has been healing well without s/s of infection. She has had improvement in balance, improvement in Left sided weakness and ataxia. Left neglect has resolved.  She has had good gains during her rehab stay and is at modified independent to supervision level. Husband has been supportive and has been present for most rehab sessions/stay. She will continue to receive outpatient OT at [facility] after discharge.    Rehab course: During patient's stay in rehab weekly team conferences were held to monitor patient's progress, set goals and discuss barriers to discharge. Patient has had improvement in activity tolerance, balance, postural control, as well as ability to compensate for deficits. She is modified independent to supervision for ADL tasks. She is modified independent for transfers and requires supervision for ambulating  300 feet with SPC. Her BERG score has improved from 10/56 to 46/56. Speech therapy has focused on attention, problem solving and memory. Patient is modified independent for cognitive tasks and is currently at cognitive baseline.  Family education was done with husband  who will provide supervision post discharge.       Disposition: Home   Diet: Heart Healthy.   Special Instructions: 1. No Driving.     Medication List    TAKE these medications        aspirin EC 81 MG tablet  Take 162 mg by mouth daily.     atorvastatin 80 MG tablet  Commonly known as:  LIPITOR  Take 80 mg by mouth every evening.     docusate sodium  100 MG capsule  Commonly known as:  COLACE  Take 100 mg by mouth 2 (two) times daily.     fish oil-omega-3 fatty acids 1000 MG capsule  Take 1,000 mg by mouth daily.     folic acid 400 MCG tablet  Commonly known as:  FOLVITE  Take 800 mcg by mouth daily.     iron polysaccharides 150 MG capsule  Commonly known as:  NIFEREX  Take 1 capsule (150 mg total) by mouth 2 (two) times daily before lunch and supper.     metoprolol succinate 25 MG 24 hr tablet  Commonly known as:  TOPROL-XL  TAKE 1 TABLET BY MOUTH DAILY     mirtazapine 30 MG tablet  Commonly known as:  REMERON  Take 1 tablet by mouth at bedtime.     nitroGLYCERIN 0.4 MG SL tablet  Commonly known as:  NITROSTAT  Place 1 tablet (0.4 mg total) under the tongue every 5 (five) minutes as needed for chest pain.     ondansetron 4 MG tablet  Commonly known as:  ZOFRAN  Take 4 mg by mouth every 6 (six) hours as needed for nausea or vomiting.     oxyCODONE 5 MG immediate release tablet--Rx # 20 pills  Commonly known as:  ROXICODONE  Take 1 tablet (5 mg total) by mouth every 6 (six) hours as needed.     pantoprazole 40 MG tablet  Commonly known as:  PROTONIX  TAKE 1 TABLET TWICE DAILY     sertraline 100 MG tablet  Commonly known as:  ZOLOFT  Take 100 mg by mouth 2 (two) times daily.       Follow-up Information    Follow up with Charlett Blake, MD On 09/22/2014.   Specialty:  Physical Medicine and Rehabilitation   Why:  Be there at 2:15  for 2:30 am  appointment    Contact information:   [place] [address] [phone]       Follow up with Ruta Hinds, MD.   Specialties:  Vascular Surgery, Cardiology   Contact information:   [address] [place] [address] [phone]       Follow up with [facility], NP On 08/30/2014.   Specialty:  Internal Medicine   Why:  APPT @ 9;30 AM   Contact information:   [place], [place] [address] [phone]        Follow up with Xu,Jindong, MD. Call in 2 days.   Specialty:  Neurology   Why:  for follow up in one month   Contact information:   [address] [place] [address] [phone]       Signed: Bary Leriche 08/18/2014, 4:41 PM

## 2014-08-18 NOTE — Progress Notes (Signed)
Physical Therapy Session Note  Patient Details  Name: [name] MRN: [id number] Date of Birth: 1940/08/20  Today's Date: 08/18/2014 PT Individual Time: 1300-1330 PT Individual Time Calculation (min): 30 min   Short Term Goals: Week 1:  PT Short Term Goal 1 (Week 1): Patient will perform bed mobility with minA from flat surface. PT Short Term Goal 2 (Week 1): Patient will perform stand pivot transfers without AD and supervision. PT Short Term Goal 3 (Week 1): Patient will ambulate 100' without AD and minA. PT Short Term Goal 4 (Week 1): Patient will negotiate one curb step with appropriate AD/HHA with minA to simulate STE home.  Skilled Therapeutic Interventions/Progress Updates:   Pt received in bed; cued to come EOB; performed with supervision but midway through transition pt reported she was "stuck". Continued to require verbal cues to attend to LUE and use of LUE to initiate upper trunk rotation to R to sit upright EOB.  Provided pt with handout of OTAGO balance HEP; demonstrated all exercises to pt and had pt give repeat demonstration of exercises.  Discussed safety and recommendations for exercises.  Also discussed falls risk and how to perform floor >furniture transfers.  Also discussed indications for calling EMS.  Had pt return demonstrate floor > furniture transfer but required min-mod A for weight shifting and sequence to transition from lying on L side > quadruped; once in quadruped required min A-supervision to transition to standing and sitting in chair; husband present to observe.  At end of session pt transferred to w/c and left with husband.      Therapy Documentation Precautions:  Precautions Precautions: Fall Precaution Comments: LUE hemiparesis Restrictions Weight Bearing Restrictions: No Vital Signs: Therapy Vitals Temp: 98 F (36.7 C) Temp Source: Oral Pulse Rate: 71 Resp: 18 BP: (!) 114/43 mmHg Patient Position (if appropriate): Lying Oxygen  Therapy SpO2: 99 % O2 Device: Not Delivered Pain: Pain Assessment Pain Assessment: No/denies pain  See FIM for current functional status  Therapy/Group: Individual Therapy  Raylene Everts [facility] 08/18/2014, 3:17 PM

## 2014-08-18 NOTE — Progress Notes (Signed)
Social Work Discharge Note Discharge Note  The overall goal for the admission was met for:   Discharge location: Yes-HOME WITH HUSBAND WHO CAN PROVIDE 24 HR CARE  Length of Stay: Yes-8 DAYS  Discharge activity level: Yes-SUPERVISION/MOD/I LEVEL  Home/community participation: Yes  Services provided included: MD, RD, PT, OT, SLP, RN, CM, TR, Pharmacy and SW  Financial Services: Private Insurance: [place]  Follow-up services arranged: Outpatient: [place]  8/3 1;15-3;30 PM, DME: [facility]-CANE TO GET TUB SEAT ON THEIR OWN and Patient/Family has no preference for HH/DME agencies  Comments (or additional information):HUSBNAND WAS HERE DAILY AND PARTICIPATED IN HER CARE, BOTH FEEL READY TO GO HOME AND DISCHARGED MOVED UP DUE TO REACHING GOALS SOONER.  Patient/Family verbalized understanding of follow-up arrangements: Yes  Individual responsible for coordination of the follow-up plan: CLINT[facility]-HUSBAND & SELF  Confirmed correct DME delivered: Elease Hashimoto 08/18/2014    Elease Hashimoto

## 2014-08-18 NOTE — Progress Notes (Signed)
Social Work Patient ID: [name], female   DOB: 1940-08-26, 74 y.o.   MRN: [id number] Met with pt and husband team recommends cane and tub seat.  Informed tub seat is not covered, they will get one on their own. Agreeable to cane-referral made to [facility] to deliver cane to room prior to discharge.

## 2014-08-18 NOTE — Progress Notes (Signed)
Physical Therapy Discharge Summary  Patient Details  Name: [name] MRN: [id number] Date of Birth: [birth year]  Patient has met 12 of 12 long term goals due to improved activity tolerance, improved balance, improved postural control, increased strength, increased range of motion, decreased pain, ability to compensate for deficits, functional use of  left upper extremity and left lower extremity, improved attention, improved awareness and improved coordination.  Patient to discharge at an ambulatory level Supervision.   Patient's care partner is independent to provide the necessary physical assistance at discharge.  Reasons goals not met: NA  Recommendation:  Patient will benefit from ongoing skilled PT services in outpatient setting to continue to advance safe functional mobility, address ongoing impairments in L hemiparesis, , and minimize fall risk.  Equipment: No equipment provided-patient has needed DME  Reasons for discharge: treatment goals met and discharge from hospital  Patient/family agrees with progress made and goals achieved: Yes  PT Discharge Precautions/Restrictions Precautions Precaution Comments: LUE hemiparesis Restrictions Weight Bearing Restrictions: No Pain Pain Assessment Pain Assessment: No/denies pain Cognition Overall Cognitive Status: Within Functional Limits for tasks assessed Arousal/Alertness: Awake/alert Orientation Level: Oriented X4 Attention: Selective Focused Attention: Appears intact Sustained Attention: Appears intact Selective Attention: Appears intact Memory: Appears intact Awareness: Appears intact Problem Solving: Appears intact Reasoning: Appears intact Organizing: Appears intact Self Correcting: Appears intact Safety/Judgment: Appears intact Comments: Patient scored a 27/30 points on the MoCA with a score of 26 or above considered normal.  Sensation Sensation Light Touch: Appears Intact (LLE) Hot/Cold: Appears  Intact Proprioception: Appears Intact (LLE) Coordination Gross Motor Movements are Fluid and Coordinated: No Fine Motor Movements are Fluid and Coordinated: No Coordination and Movement Description: LUE > LLE hemiparesis Motor  Motor Motor: Hemiplegia;Abnormal postural alignment and control Motor - Discharge Observations: LUE hemiparesis, inattention to LUE/L environment  Mobility Bed Mobility Bed Mobility: Supine to Sit;Sit to Supine Supine to Sit: 6: Modified independent (Device/Increase time);HOB flat Sit to Supine: 6: Modified independent (Device/Increase time);HOB flat Transfers Transfers: Yes Sit to Stand: 6: Modified independent (Device/Increase time);With upper extremity assist;Without upper extremity assist;From bed;From chair/3-in-1 Stand to Sit: 6: Modified independent (Device/Increase time);With upper extremity assist;Without upper extremity assist;To bed;To chair/3-in-1 Locomotion  Ambulation Ambulation: Yes Ambulation/Gait Assistance: 5: Supervision Ambulation Distance (Feet): 200 Feet Assistive device: Straight cane Gait Gait: Yes Gait Pattern: Step-through pattern;Decreased stance time - left;Decreased stride length;Trunk flexed;Narrow base of support Gait velocity: 10 MWT = 0.53 m/s Stairs / Additional Locomotion Stairs: Yes Stairs Assistance: 5: Supervision Stair Management Technique: With cane;Step to pattern;Forwards;Sideways (ascend forwards, descend sideways) Number of Stairs: 12 Height of Stairs: 6 Ramp: 5: Supervision Curb: 5: Supervision Wheelchair Mobility Wheelchair Mobility: No (pt ambulatory)  Trunk/Postural Assessment  Cervical Assessment Cervical Assessment: Exceptions to [facility] (forward head) Thoracic Assessment Thoracic Assessment: Exceptions to [facility] (upper thoracic kyphosis) Lumbar Assessment Lumbar Assessment: Within Functional Limits Postural Control Postural Control: Within Functional Limits Protective Responses: improved from eval   Balance Balance Balance Assessed: Yes Standardized Balance Assessment Standardized Balance Assessment: Berg Balance Test Berg Balance Test Sit to Stand: Able to stand without using hands and stabilize independently Standing Unsupported: Able to stand safely 2 minutes Sitting with Back Unsupported but Feet Supported on Floor or Stool: Able to sit safely and securely 2 minutes Stand to Sit: Sits safely with minimal use of hands Transfers: Able to transfer safely, minor use of hands Standing Unsupported with Eyes Closed: Able to stand 10 seconds safely Standing Ubsupported with Feet Together: Able to place feet together  independently and stand for 1 minute with supervision From Standing, Reach Forward with Outstretched Arm: Can reach confidently >25 cm (10") From Standing Position, Pick up Object from Floor: Able to pick up shoe safely and easily From Standing Position, Turn to Look Behind Over each Shoulder: Looks behind one side only/other side shows less weight shift Turn 360 Degrees: Able to turn 360 degrees safely but slowly Standing Unsupported, Alternately Place Feet on Step/Stool: Able to stand independently and complete 8 steps >20 seconds Standing Unsupported, One Foot in Front: Needs help to step but can hold 15 seconds Standing on One Leg: Able to lift leg independently and hold equal to or more than 3 seconds Total Score: 46/56 Static Standing Balance Static Standing - Balance Support: During functional activity;No upper extremity supported Static Standing - Level of Assistance: 6: Modified independent (Device/Increase time) Dynamic Standing Balance Dynamic Standing - Balance Support: During functional activity;No upper extremity supported Dynamic Standing - Level of Assistance: 5: Stand by assistance Extremity Assessment      RLE Assessment RLE Assessment: Within Functional Limits (grossly 4+/5 except hip flexion and knee flexion 4/5) LLE Assessment LLE Assessment:  Within Functional Limits (grossly 4+/5 except hip flexion and knee flexion 4/5)  See FIM for current functional status  Carney Living A 08/18/2014, 10:02 AM

## 2014-08-19 ENCOUNTER — Inpatient Hospital Stay (HOSPITAL_COMMUNITY): Payer: Commercial Managed Care - HMO | Admitting: Occupational Therapy

## 2014-08-19 ENCOUNTER — Inpatient Hospital Stay (HOSPITAL_COMMUNITY): Payer: Commercial Managed Care - HMO | Admitting: Physical Therapy

## 2014-08-19 NOTE — Progress Notes (Signed)
Occupational Therapy Discharge Summary  Patient Details  Name: [name] MRN: [id number] Date of Birth: 03-Jun-1940  Today's Date: 08/19/2014 OT Individual Time: 0700-0725 OT Individual Time Calculation (min): 25 min    Patient has met 11 of 11 long term goals due to improved activity tolerance, improved balance, postural control, ability to compensate for deficits, functional use of  LEFT upper and LEFT lower extremity, improved attention, improved awareness and improved coordination.  Patient to discharge at overall Mod I - supervision level.  Patient's care partner is independent to provide the necessary cognitive assistance at discharge.    Reasons goals not met: all goals met  Recommendation:  Patient will benefit from ongoing skilled OT services in outpatient setting to continue to advance functional skills in the area of BADL and iADL.  Equipment: recommended purchase of shower chair  Reasons for discharge: treatment goals met and discharge from hospital  Patient/family agrees with progress made and goals achieved: Yes   OT Intervention: Upon entering the room, pt supine in bed with no c/o pain. Pt performed supine >sit with no assistance. Pt ambulating with SPC to bathroom for toilet transfer with supervision and performing all hygiene and clothing management with increased time. Pt ambulating to closet with SPC to obtain clothing items for dressing. Pt seated on EOB for dressing tasks with min verbal cues for hemiplegic technique and increased time. Pt standing at sink side for grooming tasks with Mod I. Husband present in room during session and he along with pt have no further OT questions or concerns at this time. MD writing discharge order for pt as therapist exiting the room.   OT Discharge Precautions/Restrictions  Precautions Precaution Comments: LUE hemiparesis Vital Signs Therapy Vitals Temp: 99.4 F (37.4 C) Temp Source: Oral Pulse Rate: 83 Resp:  18 BP: (!) 120/50 mmHg Patient Position (if appropriate): Lying Oxygen Therapy SpO2: 95 % O2 Device: Not Delivered Pain Pain Assessment Pain Assessment: No/denies pain ADL ADL ADL Comments: see FIM Cognition Overall Cognitive Status: Within Functional Limits for tasks assessed Arousal/Alertness: Awake/alert Orientation Level: Oriented X4 Attention: Selective Focused Attention: Appears intact Sustained Attention: Appears intact Selective Attention: Appears intact Memory: Appears intact Awareness: Appears intact Problem Solving: Appears intact Reasoning: Appears intact Organizing: Appears intact Self Correcting: Appears intact Safety/Judgment: Appears intact Sensation Sensation Light Touch: Impaired Detail Light Touch Impaired Details: Impaired LUE Stereognosis: Not tested Hot/Cold: Appears Intact Proprioception: Impaired Detail Proprioception Impaired Details: Impaired LUE Coordination Gross Motor Movements are Fluid and Coordinated: No Fine Motor Movements are Fluid and Coordinated: No Coordination and Movement Description: LUE > LLE hemiparesis Motor  Motor Motor - Discharge Observations: LUE hemiparesis, inattention to LUE/L environment Mobility  Bed Mobility Bed Mobility: Supine to Sit;Sit to Supine Supine to Sit: 6: Modified independent (Device/Increase time);HOB flat Transfers Sit to Stand: 6: Modified independent (Device/Increase time);With upper extremity assist;Without upper extremity assist;From bed;From chair/3-in-1 Stand to Sit: 6: Modified independent (Device/Increase time);With upper extremity assist;Without upper extremity assist;To bed;To chair/3-in-1  Trunk/Postural Assessment  Cervical Assessment Cervical Assessment: Exceptions to [facility] (forward head) Thoracic Assessment Thoracic Assessment: Exceptions to [facility] (kyphotic ) Lumbar Assessment Lumbar Assessment: Within Functional Limits Postural Control Postural Control: Within Functional Limits   Balance Static Sitting Balance Static Sitting - Level of Assistance: 6: Modified independent (Device/Increase time) Static Standing Balance Static Standing - Balance Support: During functional activity;No upper extremity supported Static Standing - Level of Assistance: 6: Modified independent (Device/Increase time) Dynamic Standing Balance Dynamic Standing - Balance Support: During functional  activity;No upper extremity supported Dynamic Standing - Level of Assistance: 5: Stand by assistance Extremity/Trunk Assessment RUE Assessment RUE Assessment: Within Functional Limits LUE Assessment LUE Assessment: Exceptions to [facility] LUE Strength Left Shoulder Flexion: 3-/5 Left Elbow Flexion: 2+/5 Left Elbow Extension: 3-/5 Left Wrist Extension: 2-/5  See FIM for current functional status  Phineas Semen 08/19/2014, 7:36 AM

## 2014-08-19 NOTE — Progress Notes (Signed)
   Subjective/Complaints:  Feels well, no complaints Eager to go home Objective: Vital Signs: Blood pressure 120/50, pulse 83, temperature 99.4 F (37.4 C), temperature source Oral, resp. rate 18, weight 182 lb 15.7 oz (83 kg), SpO2 95 %.  Assessment/Plan: 1. Functional deficits secondary to Right MCA infarct  Medical Problem List and Plan: 1. Functional deficits secondary to Right MCA infarct after CEA with left hemiparesis and sensory loss, eager to go home 2.  DVT Prophylaxis/Anticoagulation: Pharmaceutical: Lovenox, monitor surgical site for hematoma- does not appear worse. Ok to go home 3. Chronic back/knee pain/Pain Management: reasonably controlled 5. Neuropsych: This patient is capable of making decisions on her own behalf. 6. Skin/Wound Care:  no pressure ulcers   7. Fluids/Electrolytes/Nutrition:appetite is ok 8. Hypotension: 114/43-120/50 9. ABLA: no bleeding- 10. R-CEA:  On Lipitor,  Lovaza and ASA 11. Barrett's esophagitis: Continue  protonix bid  12.  Hypoalb seen on CMET, LOS (Days) 8 A FACE TO FACE EVALUATION WAS PERFORMED  [name] 08/19/2014, 7:24 AM

## 2014-08-22 ENCOUNTER — Encounter: Payer: Self-pay | Admitting: Vascular Surgery

## 2014-08-24 ENCOUNTER — Ambulatory Visit (INDEPENDENT_AMBULATORY_CARE_PROVIDER_SITE_OTHER): Payer: Self-pay | Admitting: Vascular Surgery

## 2014-08-24 ENCOUNTER — Encounter: Payer: Self-pay | Admitting: Vascular Surgery

## 2014-08-24 VITALS — BP 121/72 | HR 84 | Temp 98.3°F | Resp 18 | Ht 60.0 in | Wt 149.0 lb

## 2014-08-24 DIAGNOSIS — I6523 Occlusion and stenosis of bilateral carotid arteries: Secondary | ICD-10-CM

## 2014-08-24 DIAGNOSIS — Z9889 Other specified postprocedural states: Secondary | ICD-10-CM

## 2014-08-24 DIAGNOSIS — Z48812 Encounter for surgical aftercare following surgery on the circulatory system: Secondary | ICD-10-CM

## 2014-08-24 MED ORDER — BACLOFEN 10 MG PO TABS: 10.0000 mg | ORAL_TABLET | Freq: Three times a day (TID) | ORAL | Status: DC

## 2014-08-24 NOTE — Progress Notes (Signed)
Filed Vitals:   08/24/14 1046 08/24/14 1048 08/24/14 1050  BP: 142/72 99/68 121/72  Pulse: 82 84 84  Temp: 98.3 F (36.8 C)    Resp: 18    Height: 5' (1.524 m)    Weight: 149 lb (67.586 kg)    SpO2: 100%

## 2014-08-24 NOTE — Progress Notes (Signed)
Patient is a 74 year old female who returns for postoperative follow-up today. She underwent right carotid endarterectomy on 08/08/2014. Unfortunately she had perioperative stroke in the recovery room. This resulted in a left hemiplegia. She still has some mild left facial droop. She is getting some return of function in her left upper extremity. She still has some mild weakness in her left lower extremity. She is continuing to improve through physical therapy and rehabilitation. She also complains of intermittent back spasms. Her physical therapist thought this might be due to muscle weakness from her stroke. She denies any incisional drainage. She has no difficulty swallowing.  Physical exam:  Filed Vitals:   08/24/14 1046 08/24/14 1048 08/24/14 1050  BP: 142/72 99/68 121/72  Pulse: 82 84 84  Temp: 98.3 F (36.8 C)    Resp: 18    Height: 5' (1.524 m)    Weight: 149 lb (67.586 kg)    SpO2: 100%      Neuro: Tongue midline posterior palate symmetrical rise left upper extremity 3-4 over 5 motor strength poor coordination of fingers left hand left lower extremity 4 over 5 motor strength, mild left facial droop, slight gait abnormality  Neck: Healing right neck incision  Assessment: Continuing to make some recovery from recent right brain stroke post carotid endarterectomy.  Plan: The patient was given a prescription today for baclofen the last for a few weeks until the muscle spasms can improve some. #2 the patient will follow-up with me in 6 months for repeat carotid duplex scan. She will continue her physical therapy and hopefully continued to improve from her neuro deficit.  Ruta Hinds, MD [facility] Office: [phone] Pager: [phone]

## 2014-08-28 NOTE — Addendum Note (Signed)
Addended by: Dorthula Rue L on: 08/28/2014 09:24 AM   Modules accepted: Orders

## 2014-08-29 ENCOUNTER — Inpatient Hospital Stay (HOSPITAL_COMMUNITY)
Admission: EM | Admit: 2014-08-29 | Discharge: 2014-08-31 | DRG: 389 | Disposition: A | Discharge status: Home / Self Care | Payer: Commercial Managed Care - HMO | Attending: Internal Medicine | Admitting: Internal Medicine

## 2014-08-29 ENCOUNTER — Emergency Department (HOSPITAL_COMMUNITY): Payer: Commercial Managed Care - HMO

## 2014-08-29 ENCOUNTER — Encounter (HOSPITAL_COMMUNITY): Payer: Self-pay | Admitting: *Deleted

## 2014-08-29 DIAGNOSIS — F329 Major depressive disorder, single episode, unspecified: Secondary | ICD-10-CM | POA: Diagnosis present

## 2014-08-29 DIAGNOSIS — D649 Anemia, unspecified: Secondary | ICD-10-CM

## 2014-08-29 DIAGNOSIS — K5660 Unspecified intestinal obstruction: Secondary | ICD-10-CM | POA: Diagnosis present

## 2014-08-29 DIAGNOSIS — K565 Intestinal adhesions [bands] with obstruction (postprocedural) (postinfection): Secondary | ICD-10-CM | POA: Diagnosis present

## 2014-08-29 DIAGNOSIS — Z888 Allergy status to other drugs, medicaments and biological substances status: Secondary | ICD-10-CM | POA: Diagnosis not present

## 2014-08-29 DIAGNOSIS — G8929 Other chronic pain: Secondary | ICD-10-CM | POA: Diagnosis present

## 2014-08-29 DIAGNOSIS — Z79891 Long term (current) use of opiate analgesic: Secondary | ICD-10-CM | POA: Diagnosis not present

## 2014-08-29 DIAGNOSIS — K219 Gastro-esophageal reflux disease without esophagitis: Secondary | ICD-10-CM | POA: Diagnosis present

## 2014-08-29 DIAGNOSIS — I5032 Chronic diastolic (congestive) heart failure: Secondary | ICD-10-CM | POA: Diagnosis present

## 2014-08-29 DIAGNOSIS — J449 Chronic obstructive pulmonary disease, unspecified: Secondary | ICD-10-CM | POA: Diagnosis present

## 2014-08-29 DIAGNOSIS — Z79899 Other long term (current) drug therapy: Secondary | ICD-10-CM | POA: Diagnosis not present

## 2014-08-29 DIAGNOSIS — Z933 Colostomy status: Secondary | ICD-10-CM | POA: Diagnosis not present

## 2014-08-29 DIAGNOSIS — Z8701 Personal history of pneumonia (recurrent): Secondary | ICD-10-CM

## 2014-08-29 DIAGNOSIS — I5033 Acute on chronic diastolic (congestive) heart failure: Secondary | ICD-10-CM | POA: Diagnosis present

## 2014-08-29 DIAGNOSIS — M549 Dorsalgia, unspecified: Secondary | ICD-10-CM | POA: Diagnosis present

## 2014-08-29 DIAGNOSIS — I2510 Atherosclerotic heart disease of native coronary artery without angina pectoris: Secondary | ICD-10-CM | POA: Diagnosis present

## 2014-08-29 DIAGNOSIS — K5669 Other intestinal obstruction: Secondary | ICD-10-CM | POA: Diagnosis present

## 2014-08-29 DIAGNOSIS — F17210 Nicotine dependence, cigarettes, uncomplicated: Secondary | ICD-10-CM | POA: Diagnosis present

## 2014-08-29 DIAGNOSIS — I10 Essential (primary) hypertension: Secondary | ICD-10-CM | POA: Diagnosis present

## 2014-08-29 DIAGNOSIS — E782 Mixed hyperlipidemia: Secondary | ICD-10-CM | POA: Diagnosis present

## 2014-08-29 DIAGNOSIS — I69354 Hemiplegia and hemiparesis following cerebral infarction affecting left non-dominant side: Secondary | ICD-10-CM | POA: Diagnosis not present

## 2014-08-29 DIAGNOSIS — K5649 Other impaction of intestine: Secondary | ICD-10-CM | POA: Insufficient documentation

## 2014-08-29 DIAGNOSIS — D508 Other iron deficiency anemias: Secondary | ICD-10-CM | POA: Diagnosis not present

## 2014-08-29 DIAGNOSIS — M1990 Unspecified osteoarthritis, unspecified site: Secondary | ICD-10-CM | POA: Diagnosis present

## 2014-08-29 DIAGNOSIS — R109 Unspecified abdominal pain: Secondary | ICD-10-CM

## 2014-08-29 DIAGNOSIS — Z86010 Personal history of colonic polyps: Secondary | ICD-10-CM

## 2014-08-29 DIAGNOSIS — Z7982 Long term (current) use of aspirin: Secondary | ICD-10-CM

## 2014-08-29 DIAGNOSIS — K567 Ileus, unspecified: Secondary | ICD-10-CM | POA: Diagnosis present

## 2014-08-29 DIAGNOSIS — Z87442 Personal history of urinary calculi: Secondary | ICD-10-CM | POA: Diagnosis not present

## 2014-08-29 DIAGNOSIS — M542 Cervicalgia: Secondary | ICD-10-CM | POA: Diagnosis present

## 2014-08-29 DIAGNOSIS — D638 Anemia in other chronic diseases classified elsewhere: Secondary | ICD-10-CM | POA: Diagnosis present

## 2014-08-29 DIAGNOSIS — K56609 Unspecified intestinal obstruction, unspecified as to partial versus complete obstruction: Secondary | ICD-10-CM

## 2014-08-29 DIAGNOSIS — Z882 Allergy status to sulfonamides status: Secondary | ICD-10-CM | POA: Diagnosis not present

## 2014-08-29 LAB — URINALYSIS, ROUTINE W REFLEX MICROSCOPIC
BILIRUBIN URINE: NEGATIVE
Glucose, UA: NEGATIVE mg/dL
Hgb urine dipstick: NEGATIVE
Ketones, ur: NEGATIVE mg/dL
NITRITE: NEGATIVE
PH: 6 (ref 5.0–8.0)
PROTEIN: NEGATIVE mg/dL
Specific Gravity, Urine: 1.02 (ref 1.005–1.030)
UROBILINOGEN UA: 0.2 mg/dL (ref 0.0–1.0)

## 2014-08-29 LAB — COMPREHENSIVE METABOLIC PANEL
ALK PHOS: 94 U/L (ref 38–126)
ALT: 15 U/L (ref 14–54)
ANION GAP: 11 (ref 5–15)
AST: 21 U/L (ref 15–41)
Albumin: 3.6 g/dL (ref 3.5–5.0)
BUN: 13 mg/dL (ref 6–20)
CO2: 20 mmol/L — AB (ref 22–32)
Calcium: 9.1 mg/dL (ref 8.9–10.3)
Chloride: 111 mmol/L (ref 101–111)
Creatinine, Ser: 0.66 mg/dL (ref 0.44–1.00)
Glucose, Bld: 120 mg/dL — ABNORMAL HIGH (ref 65–99)
POTASSIUM: 4.1 mmol/L (ref 3.5–5.1)
Sodium: 142 mmol/L (ref 135–145)
Total Bilirubin: 0.3 mg/dL (ref 0.3–1.2)
Total Protein: 6.6 g/dL (ref 6.5–8.1)

## 2014-08-29 LAB — URINE MICROSCOPIC-ADD ON

## 2014-08-29 LAB — LIPASE, BLOOD: LIPASE: 15 U/L — AB (ref 22–51)

## 2014-08-29 LAB — CBC
HEMATOCRIT: 31.3 % — AB (ref 36.0–46.0)
Hemoglobin: 8.4 g/dL — ABNORMAL LOW (ref 12.0–15.0)
MCH: 22.1 pg — AB (ref 26.0–34.0)
MCHC: 26.8 g/dL — ABNORMAL LOW (ref 30.0–36.0)
MCV: 82.4 fL (ref 78.0–100.0)
Platelets: 263 10*3/uL (ref 150–400)
RBC: 3.8 MIL/uL — AB (ref 3.87–5.11)
RDW: 16.1 % — ABNORMAL HIGH (ref 11.5–15.5)
WBC: 12.9 10*3/uL — ABNORMAL HIGH (ref 4.0–10.5)

## 2014-08-29 LAB — I-STAT TROPONIN, ED: TROPONIN I, POC: 0 ng/mL (ref 0.00–0.08)

## 2014-08-29 MED ORDER — SODIUM CHLORIDE 0.9 % IV BOLUS (SEPSIS)
500.0000 mL | Freq: Once | INTRAVENOUS | Status: AC
  Administered 2014-08-29: 500 mL via INTRAVENOUS

## 2014-08-29 MED ORDER — MORPHINE SULFATE 2 MG/ML IJ SOLN
1.0000 mg | INTRAMUSCULAR | Status: DC | PRN
  Administered 2014-08-30 (×2): 1 mg via INTRAVENOUS
  Filled 2014-08-29 (×2): qty 1

## 2014-08-29 MED ORDER — ONDANSETRON HCL 4 MG/2ML IJ SOLN
4.0000 mg | Freq: Once | INTRAMUSCULAR | Status: AC
  Administered 2014-08-29: 4 mg via INTRAVENOUS
  Filled 2014-08-29: qty 2

## 2014-08-29 MED ORDER — IOHEXOL 300 MG/ML  SOLN
100.0000 mL | Freq: Once | INTRAMUSCULAR | Status: AC | PRN
  Administered 2014-08-29: 100 mL via INTRAVENOUS

## 2014-08-29 MED ORDER — ONDANSETRON 4 MG PO TBDP: 4.0000 mg | ORAL_TABLET | Freq: Once | ORAL | Status: DC | PRN

## 2014-08-29 MED ORDER — IOHEXOL 300 MG/ML  SOLN
25.0000 mL | Freq: Once | INTRAMUSCULAR | Status: AC | PRN
  Administered 2014-08-29: 25 mL via ORAL

## 2014-08-29 MED ORDER — HYDROMORPHONE HCL 1 MG/ML IJ SOLN
0.5000 mg | Freq: Once | INTRAMUSCULAR | Status: AC
  Administered 2014-08-29: 0.5 mg via INTRAVENOUS
  Filled 2014-08-29: qty 1

## 2014-08-29 NOTE — ED Notes (Signed)
Pt has history of bowel blockage last April. Pt had stroke on 7/19 when she had a CEA done and is weakness on left side since.  Pt here today with upper abdominal pain and nausea.  Pt appears weak and pale.

## 2014-08-29 NOTE — ED Notes (Signed)
Attempted to call report

## 2014-08-29 NOTE — ED Provider Notes (Signed)
CSN: [id number]     Arrival date & time 08/29/14  1553 History   First MD Initiated Contact with Patient 08/29/14 1706     Chief Complaint  Patient presents with  . Abdominal Pain  . Nausea  . Diarrhea    twice today only     (Consider location/radiation/quality/duration/timing/severity/associated sxs/prior Treatment) Patient is a 74 y.o. female presenting with abdominal pain and diarrhea. The history is provided by the patient (the pt complains of nauseau and abd pain).  Abdominal Pain Pain location:  Generalized Pain quality: aching   Pain radiates to:  Does not radiate Pain severity:  Moderate Onset quality:  Sudden Timing:  Constant Progression:  Worsening Chronicity:  New Context: not alcohol use   Associated symptoms: diarrhea   Associated symptoms: no chest pain, no cough, no fatigue and no hematuria   Diarrhea Associated symptoms: abdominal pain   Associated symptoms: no headaches     Past Medical History  Diagnosis Date  . Carotid artery disease     a. 60-70% bilat ICA stenosis by dopplers 05/2012.  Marland Kitchen Other and unspecified hyperlipidemia     takes Lipitor daily  . CAD (coronary artery disease)     a. Mod dz 2010 initially mgd medically. b. 12/2012 - angina s/p PTCA/DES to mid-circumflex, PTCA/DES to first OM.   Marland Kitchen Barrett's esophagus   . SBO (small bowel obstruction)     a. Lysis of adhesions and ovarian cystectomy 04/2013 for sBO. b. Admitted 07/2013 for colonic obstruction due to suspected colitis, s/p partial colectomy/colostomy 08/01/13. Admission complicated by anasarca, acute resp failure requiring tracheostomy, decannulated 08/25/13. c. Recurrent SBO8/2015, NGT placed for decompression.   . Colonic obstruction due to suspected colitis; s/p colectomy/colostomy     a. See SBO.  Marland Kitchen Protein-calorie malnutrition, severe w/ electrolyte imbalance     a. Severe hypoalbuminemia leading to 3rd spacing including anasarca and pulm edema 07/2013.  Marland Kitchen DVT of axillary vein, acute  right     a. Dx 07/2013 felt due to R IJ central line that had been inserted 7/14. Not on anticoag due to GIB issues and small cerebral bleed.  . Acute respiratory failure with hypoxia s/p tracheostomy 07/2013  . Intracranial hemorrhage     a. 08/11/13 - per DC summary, tiny SAH vs SDH done for altered mentation, anticoag stopped including aspirin.  . Anemia     a. 7-08/2013 felt due to recent critical illness/chronic disease.  . Complication of anesthesia     hard to wake up from anesthesia   . Hypertension     takes Metoprolol daily  . HTN (hypertension)   . Heart murmur   . COPD (chronic obstructive pulmonary disease)   . Pneumonia     hx of->15 yrs ago  . History of bronchitis     > 55yr ago   . Dizziness     was on Bentyl which caused this-took off of it and no problems since  . Stroke early 80's    right sided weakness--TIA  . Joint pain   . Chronic back pain     deteriorating;DDD  . Neck pain     DDD  . History of hiatal hernia   . History of colon polyps   . History of kidney stones     was told it was stable but doesn't know for sure that she ever passed it  . Depression     takes Remeron and Zoloft daily  . GERD (gastroesophageal reflux disease)   .  Arthritis     handds, knees & back    Past Surgical History  Procedure Laterality Date  . Appendectomy    . Cholecystectomy    . Colon resection    . 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 Rotator cuff repair Left   . Abdominal hysterectomy    . Colon surgery    . Foot surgery Left   . Hand surgery Bilateral     for ganglion cysts  . Laparotomy N/A 08/01/2013    Procedure: EXPLORATORY LAPAROTOMY;  Surgeon: Harl Bowie, MD;  Location: [place];  Service: General;  Laterality: N/A;  . Partial colectomy N/A 08/01/2013    Procedure: PARTIAL COLECTOMY;  Surgeon: Harl Bowie, MD;  Location: [place];  Service: General;  Laterality: N/A;  . Colostomy N/A  08/01/2013    Procedure: COLOSTOMY;  Surgeon: Harl Bowie, MD;  Location: [place];  Service: General;  Laterality: N/A;  . Bowel resection N/A 08/01/2013    Procedure: SMALL BOWEL RESECTION;  Surgeon: Harl Bowie, MD;  Location: [place];  Service: General;  Laterality: N/A;  . Tracheostomy      feinstein  . Left heart catheterization with coronary angiogram N/A 12/20/2012    Procedure: LEFT HEART CATHETERIZATION WITH CORONARY ANGIOGRAM;  Surgeon: Blane Ohara, MD;  Location: [facility] CATH LAB;  Service: Cardiovascular;  Laterality: N/A;  . Cataract surgery Bilateral   . Colostomy takedown  01/24/2014    dr Ninfa Linden  . Colostomy takedown N/A 01/24/2014    Procedure: COLOSTOMY TAKEDOWN;  Surgeon: Coralie Keens, MD;  Location: [place];  Service: General;  Laterality: N/A;  . Tonsillectomy    . Endarterectomy Right 08/08/2014    Procedure: RIGHT CAROTID ENDARTERECTOMY WITH HEMASHIELD PATCH ANGIOPLASTY;  Surgeon: Elam Dutch, MD;  Location: [facility] OR;  Service: Vascular;  Laterality: Right;   Family History  Problem Relation Age of Onset  . Heart disease    . Varicose Veins Mother   . Heart disease Father   . Heart attack Father   . AAA (abdominal aortic aneurysm) Father    History  Substance Use Topics  . Smoking status: Current Some Day Smoker -- 0.50 packs/day for 53 years    Types: Cigarettes  . Smokeless tobacco: Never Used     Comment: also uses e cig  . Alcohol Use: No   OB History    No data available     Review of Systems  Constitutional: Negative for appetite change and fatigue.  HENT: Negative for congestion, ear discharge and sinus pressure.   Eyes: Negative for discharge.  Respiratory: Negative for cough.   Cardiovascular: Negative for chest pain.  Gastrointestinal: Positive for abdominal pain and diarrhea.  Genitourinary: Negative for frequency and hematuria.  Musculoskeletal: Negative for back pain.  Skin: Negative for rash.  Neurological: Negative for  seizures and headaches.  Psychiatric/Behavioral: Negative for hallucinations.      Allergies  Pregabalin and Sulfonamide derivatives  Home Medications   Prior to Admission medications   Medication Sig Start Date End Date Taking? Authorizing Provider  aspirin EC 81 MG tablet Take 162 mg by mouth daily.   Yes Historical Provider, MD  atorvastatin (LIPITOR) 80 MG tablet Take 80 mg by mouth every evening.    Yes Historical Provider, MD  baclofen (LIORESAL) 10 MG tablet Take 1 tablet (10 mg total) by mouth 3 (three)  times daily. Patient taking differently: Take 10 mg by mouth daily as needed for muscle spasms.  08/24/14  Yes Elam Dutch, MD  docusate sodium (COLACE) 100 MG capsule Take 100 mg by mouth 2 (two) times daily.   Yes Historical Provider, MD  fish oil-omega-3 fatty acids 1000 MG capsule Take 1,000 mg by mouth daily.    Yes Historical Provider, MD  folic acid (FOLVITE) 161 MCG tablet Take 800 mcg by mouth daily.    Yes Historical Provider, MD  iron polysaccharides (NIFEREX) 150 MG capsule Take 1 capsule (150 mg total) by mouth 2 (two) times daily before lunch and supper. 08/18/14  Yes Ivan Anchors Love, PA-C  metoprolol succinate (TOPROL-XL) 25 MG 24 hr tablet TAKE 1 TABLET BY MOUTH DAILY Patient taking differently: TAKE 1 TABLET BY MOUTH DAILY  in the a.m. 03/28/14  Yes Dorothy Spark, MD  mirtazapine (REMERON) 30 MG tablet Take 1 tablet by mouth at bedtime.  04/18/14  Yes Historical Provider, MD  nitroGLYCERIN (NITROSTAT) 0.4 MG SL tablet Place 1 tablet (0.4 mg total) under the tongue every 5 (five) minutes as needed for chest pain. 12/13/12  Yes Dorothy Spark, MD  ondansetron (ZOFRAN) 4 MG tablet Take 4 mg by mouth every 6 (six) hours as needed for nausea or vomiting.    Yes Historical Provider, MD  oxyCODONE (ROXICODONE) 5 MG immediate release tablet Take 1 tablet (5 mg total) by mouth every 6 (six) hours as needed. Patient taking differently: Take 5 mg by mouth every 6 (six)  hours as needed for moderate pain.  08/18/14  Yes Ivan Anchors Love, PA-C  pantoprazole (PROTONIX) 40 MG tablet TAKE 1 TABLET TWICE DAILY 03/28/14  Yes Dorothy Spark, MD  sertraline (ZOLOFT) 100 MG tablet Take 100 mg by mouth 2 (two) times daily. 06/27/14  Yes Historical Provider, MD   BP 105/47 mmHg  Pulse 105  Temp(Src) 98.1 F (36.7 C) (Oral)  Resp 18  SpO2 96% Physical Exam  Constitutional: She is oriented to person, place, and time. She appears well-developed.  HENT:  Head: Normocephalic.  Eyes: Conjunctivae and EOM are normal. No scleral icterus.  Neck: Neck supple. No thyromegaly present.  Cardiovascular: Normal rate and regular rhythm.  Exam reveals no gallop and no friction rub.   No murmur heard. Pulmonary/Chest: No stridor. She has no wheezes. She has no rales. She exhibits no tenderness.  Abdominal: She exhibits no distension. There is tenderness. There is no rebound.  Musculoskeletal: Normal range of motion. She exhibits no edema.  Lymphadenopathy:    She has no cervical adenopathy.  Neurological: She is oriented to person, place, and time. She exhibits normal muscle tone. Coordination normal.  Skin: No rash noted. No erythema.  Psychiatric: She has a normal mood and affect. Her behavior is normal.    ED Course  Procedures (including critical care time) Labs Review Labs Reviewed  LIPASE, BLOOD - Abnormal; Notable for the following:    Lipase 15 (*)    All other components within normal limits  COMPREHENSIVE METABOLIC PANEL - Abnormal; Notable for the following:    CO2 20 (*)    Glucose, Bld 120 (*)    All other components within normal limits  CBC - Abnormal; Notable for the following:    WBC 12.9 (*)    RBC 3.80 (*)    Hemoglobin 8.4 (*)    HCT 31.3 (*)    MCH 22.1 (*)    MCHC 26.8 (*)  RDW 16.1 (*)    All other components within normal limits  URINALYSIS, ROUTINE W REFLEX MICROSCOPIC (NOT AT [facility]) - Abnormal; Notable for the following:    Leukocytes, UA  SMALL (*)    All other components within normal limits  URINE MICROSCOPIC-ADD ON - Abnormal; Notable for the following:    Squamous Epithelial / LPF FEW (*)    All other components within normal limits  I-STAT TROPOININ, ED    Imaging Review Ct Abdomen Pelvis W Contrast  08/29/2014   CLINICAL DATA:  74 year old female with abdominal pain and nausea.  EXAM: CT ABDOMEN AND PELVIS WITH CONTRAST  TECHNIQUE: Multidetector CT imaging of the abdomen and pelvis was performed using the standard protocol following bolus administration of intravenous contrast.  CONTRAST:  162m OMNIPAQUE IOHEXOL 300 MG/ML  SOLN  COMPARISON:  09/16/2013  FINDINGS: Lower chest: No pleural effusion identified. No pericardial effusion.  Hepatobiliary: There is no suspicious liver abnormality. Prior cholecystectomy. No biliary dilatation.  Pancreas: Normal appearance of the pancreas.  Spleen: The spleen is normal.  Adrenals/Urinary Tract: The adrenal glands are both within normal limits. Normal appearance of both kidneys. The urinary bladder appears within normal limits.  Stomach/Bowel: The stomach appears normal. There is abnormal increased dilatation of the proximal small bowel loops which measure 5.1 cm, image 49/ series 5. There is fecalization of the small bowel contents. Transition to relative normal caliber small bowel loops are noted in the right abdomen, image 53/series 5. Some enteric contrast material can be seen distal to the relative transition point. There has been interval reversal of the patient's colostomy. The large bowel loops have a normal caliber without evidence for obstruction.  Vascular/Lymphatic: Calcified atherosclerotic disease involves the abdominal aorta. No aneurysm. No enlarged retroperitoneal or mesenteric adenopathy. No enlarged pelvic or inguinal lymph nodes.  Reproductive: Previous hysterectomy.  No adnexal mass identified.  Other: No free fluid or fluid collections within the abdomen or pelvis. There is  a ventral abdominal wall hernia which contains nonobstructed loops of bowel, image 44/series 2.  Musculoskeletal: There are no aggressive lytic or sclerotic bone lesions. Scoliosis and degenerative disc disease noted.  IMPRESSION: 1. Small bowel dilatation with relative transition point in the distal small bowel within the right side of abdomen. Findings are worrisome for partial small bowel obstruction perhaps due to chronic postoperative change/adhesions. 2. No bowel perforation. 3. Aortic atherosclerosis 4. Ventral abdominal wall hernia containing nonobstructed loops of small bowel. 5. Prior cholecystectomy.   Electronically Signed   By: TKerby MoorsM.D.   On: 08/29/2014 19:06     EKG Interpretation   Date/Time:  Tuesday August 29 2014 17:01:57 EDT Ventricular Rate:  74 PR Interval:  158 QRS Duration: 74 QT Interval:  412 QTC Calculation: 457 R Axis:   72 Text Interpretation:  Normal sinus rhythm Normal ECG Confirmed by Riannon Mukherjee   MD, Tayo Maute ([id number] on 08/29/2014 8:25:48 PM      MDM   Final diagnoses:  SBO (small bowel obstruction)    Pt has sbo.  Surgery consulted and will follow pt,  Medicine admit   JMilton Ferguson MD 08/29/14 2213

## 2014-08-29 NOTE — ED Notes (Signed)
[name], pt's daughter would like to be updated on care, [phone]

## 2014-08-29 NOTE — ED Notes (Signed)
MD at bedside. 

## 2014-08-29 NOTE — ED Notes (Signed)
Patient is resting comfortably. 

## 2014-08-29 NOTE — H&P (Signed)
[facility] History and Physical  [name] VZD:[id number] DOB: March 14, 1940 DOA: 08/29/2014  Referring physician: Milton Ferguson, MD PCP: Laverna Peace, NP   Chief Complaint: Abdominal pain  HPI: [name] is a 74 y.o. female with history of HTN HLD CHF stroke prior bowel obstruction and colostomy presents with abdominal pain. Patient had a carotid endarterectomy done on July 19 which was followed by a stroke post operatively. Patient has a residual Left hemiplegia more pronounced in the upper extremity. She has been undergoing rehab apparently. Today she presents with abdominal pain going on since around 2 PM. She has been able to have bowel movements and she has had no nausea or vomiting noted. Now she states her pain has resolved and she is able to pass gas. Apparently in January this year she had a reversal of her colostomy but had colonic stricture develop some months later and she has a history of Small Bowel resection with adhesions.   Review of Systems:  12 point ROS performed and is unremarkable other than HPI  Past Medical History  Diagnosis Date  . Carotid artery disease     a. 60-70% bilat ICA stenosis by dopplers 05/2012.  Marland Kitchen Other and unspecified hyperlipidemia     takes Lipitor daily  . CAD (coronary artery disease)     a. Mod dz 2010 initially mgd medically. b. 12/2012 - angina s/p PTCA/DES to mid-circumflex, PTCA/DES to first OM.   Marland Kitchen Barrett's esophagus   . SBO (small bowel obstruction)     a. Lysis of adhesions and ovarian cystectomy 04/2013 for sBO. b. Admitted 07/2013 for colonic obstruction due to suspected colitis, s/p partial colectomy/colostomy 08/01/13. Admission complicated by anasarca, acute resp failure requiring tracheostomy, decannulated 08/25/13. c. Recurrent SBO8/2015, NGT placed for decompression.   . Colonic obstruction due to suspected colitis; s/p colectomy/colostomy     a. See SBO.  Marland Kitchen Protein-calorie malnutrition, severe w/ electrolyte  imbalance     a. Severe hypoalbuminemia leading to 3rd spacing including anasarca and pulm edema 07/2013.  Marland Kitchen DVT of axillary vein, acute right     a. Dx 07/2013 felt due to R IJ central line that had been inserted 7/14. Not on anticoag due to GIB issues and small cerebral bleed.  . Acute respiratory failure with hypoxia s/p tracheostomy 07/2013  . Intracranial hemorrhage     a. 08/11/13 - per DC summary, tiny SAH vs SDH done for altered mentation, anticoag stopped including aspirin.  . Anemia     a. 7-08/2013 felt due to recent critical illness/chronic disease.  . Complication of anesthesia     hard to wake up from anesthesia   . Hypertension     takes Metoprolol daily  . HTN (hypertension)   . Heart murmur   . COPD (chronic obstructive pulmonary disease)   . Pneumonia     hx of->15 yrs ago  . History of bronchitis     > 33yr ago   . Dizziness     was on Bentyl which caused this-took off of it and no problems since  . Stroke early 80's    right sided weakness--TIA  . Joint pain   . Chronic back pain     deteriorating;DDD  . Neck pain     DDD  . History of hiatal hernia   . History of colon polyps   . History of kidney stones     was told it was stable but doesn't know for sure that she ever passed it  .  Depression     takes Remeron and Zoloft daily  . GERD (gastroesophageal reflux disease)   . Arthritis     handds, knees & back    Past Surgical History  Procedure Laterality Date  . Appendectomy    . Cholecystectomy    . Colon resection    . 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 Rotator cuff repair Left   . Abdominal hysterectomy    . Colon surgery    . Foot surgery Left   . Hand surgery Bilateral     for ganglion cysts  . Laparotomy N/A 08/01/2013    Procedure: EXPLORATORY LAPAROTOMY;  Surgeon: Harl Bowie, MD;  Location: [facility];  Service: General;  Laterality: N/A;  . Partial colectomy N/A 08/01/2013     Procedure: PARTIAL COLECTOMY;  Surgeon: Harl Bowie, MD;  Location: [facility];  Service: General;  Laterality: N/A;  . Colostomy N/A 08/01/2013    Procedure: COLOSTOMY;  Surgeon: Harl Bowie, MD;  Location: [facility];  Service: General;  Laterality: N/A;  . Bowel resection N/A 08/01/2013    Procedure: SMALL BOWEL RESECTION;  Surgeon: Harl Bowie, MD;  Location: [facility];  Service: General;  Laterality: N/A;  . Tracheostomy      feinstein  . Left heart catheterization with coronary angiogram N/A 12/20/2012    Procedure: LEFT HEART CATHETERIZATION WITH CORONARY ANGIOGRAM;  Surgeon: Blane Ohara, MD;  Location: [facility];  Service: Cardiovascular;  Laterality: N/A;  . Cataract surgery Bilateral   . Colostomy takedown  01/24/2014    dr Ninfa Linden  . Colostomy takedown N/A 01/24/2014    Procedure: COLOSTOMY TAKEDOWN;  Surgeon: Coralie Keens, MD;  Location: [facility];  Service: General;  Laterality: N/A;  . Tonsillectomy    . Endarterectomy Right 08/08/2014    Procedure: RIGHT CAROTID ENDARTERECTOMY WITH HEMASHIELD PATCH ANGIOPLASTY;  Surgeon: Elam Dutch, MD;  Location: [facility];  Service: Vascular;  Laterality: Right;   Social History:  reports that she has been smoking Cigarettes.  She has a 26.5 pack-year smoking history. She has never used smokeless tobacco. She reports that she does not drink alcohol or use illicit drugs.  Allergies  Allergen Reactions  . Pregabalin Swelling    Tongue swelling  . Sulfonamide Derivatives Swelling    Childhood reaction    Family History  Problem Relation Age of Onset  . Heart disease    . Varicose Veins Mother   . Heart disease Father   . Heart attack Father   . AAA (abdominal aortic aneurysm) Father      Prior to Admission medications   Medication Sig Start Date End Date Taking? Authorizing Provider  aspirin EC 81 MG tablet Take 162 mg by mouth daily.   Yes Historical Provider, MD  atorvastatin (LIPITOR) 80 MG tablet Take 80 mg by  mouth every evening.    Yes Historical Provider, MD  baclofen (LIORESAL) 10 MG tablet Take 1 tablet (10 mg total) by mouth 3 (three) times daily. Patient taking differently: Take 10 mg by mouth daily as needed for muscle spasms.  08/24/14  Yes Elam Dutch, MD  docusate sodium (COLACE) 100 MG capsule Take 100 mg by mouth 2 (two) times daily.   Yes Historical Provider, MD  fish oil-omega-3 fatty acids 1000 MG capsule Take 1,000 mg by mouth daily.    Yes Historical Provider, MD  folic acid (FOLVITE) 229 MCG tablet Take 800 mcg by mouth daily.    Yes Historical Provider, MD  iron polysaccharides (NIFEREX) 150 MG capsule Take 1 capsule (150 mg total) by mouth 2 (two) times daily before lunch and supper. 08/18/14  Yes Ivan Anchors Love, PA-C  metoprolol succinate (TOPROL-XL) 25 MG 24 hr tablet TAKE 1 TABLET BY MOUTH DAILY Patient taking differently: TAKE 1 TABLET BY MOUTH DAILY  in the a.m. 03/28/14  Yes Dorothy Spark, MD  mirtazapine (REMERON) 30 MG tablet Take 1 tablet by mouth at bedtime.  04/18/14  Yes Historical Provider, MD  nitroGLYCERIN (NITROSTAT) 0.4 MG SL tablet Place 1 tablet (0.4 mg total) under the tongue every 5 (five) minutes as needed for chest pain. 12/13/12  Yes Dorothy Spark, MD  ondansetron (ZOFRAN) 4 MG tablet Take 4 mg by mouth every 6 (six) hours as needed for nausea or vomiting.    Yes Historical Provider, MD  oxyCODONE (ROXICODONE) 5 MG immediate release tablet Take 1 tablet (5 mg total) by mouth every 6 (six) hours as needed. Patient taking differently: Take 5 mg by mouth every 6 (six) hours as needed for moderate pain.  08/18/14  Yes Ivan Anchors Love, PA-C  pantoprazole (PROTONIX) 40 MG tablet TAKE 1 TABLET TWICE DAILY 03/28/14  Yes Dorothy Spark, MD  sertraline (ZOLOFT) 100 MG tablet Take 100 mg by mouth 2 (two) times daily. 06/27/14  Yes Historical Provider, MD   Physical Exam: Filed Vitals:   08/29/14 1945 08/29/14 2000 08/29/14 2015 08/29/14 2118  BP: 137/62 96/49 120/54  105/47  Pulse: 119 108 114 105  Temp:      TempSrc:      Resp: '22 17 18 18  '$ SpO2: 95% 94% 96% 96%    Wt Readings from Last 3 Encounters:  08/24/14 67.586 kg (149 lb)  08/19/14 83 kg (182 lb 15.7 oz)  08/08/14 69.4 kg (153 lb)    General:  Appears calm and comfortable Eyes: PERRL, normal lids, irises & conjunctiva ENT: grossly normal hearing, lips & tongue Neck: no LAD, masses or thyromegaly Cardiovascular: RRR, no m/r/g. No LE edema Respiratory: CTA bilaterally, no w/r/r. Normal respiratory effort. Abdomen: soft, upper abdominal pain states is improving. Positive Bowel Sounds noted Skin: no rash or induration seen on limited exam Musculoskeletal: LUE Spastic Psychiatric: grossly normal mood and affect Neurologic: left hemiparesis more pronounced in the UE          Labs on Admission:  Basic Metabolic Panel:  Recent Labs Lab 08/29/14 1742  NA 142  K 4.1  CL 111  CO2 20*  GLUCOSE 120*  BUN 13  CREATININE 0.66  CALCIUM 9.1   Liver Function Tests:  Recent Labs Lab 08/29/14 1742  AST 21  ALT 15  ALKPHOS 94  BILITOT 0.3  PROT 6.6  ALBUMIN 3.6    Recent Labs Lab 08/29/14 1742  LIPASE 15*   No results for input(s): AMMONIA in the last 168 hours. CBC:  Recent Labs Lab 08/29/14 1742  WBC 12.9*  HGB 8.4*  HCT 31.3*  MCV 82.4  PLT 263   Cardiac Enzymes: No results for input(s): CKTOTAL, CKMB, CKMBINDEX, TROPONINI in the last 168 hours.  BNP (last 3 results)  Recent Labs  01/29/14 0433  BNP 219.9*    ProBNP (last 3 results)  Recent Labs  10/20/13 1550  PROBNP 379.5*    CBG: No results for input(s): GLUCAP in the last 168 hours.  Radiological Exams on Admission:  Ct Abdomen Pelvis W Contrast  08/29/2014   CLINICAL DATA:  74 year old female with abdominal pain and nausea.  EXAM: CT ABDOMEN AND PELVIS WITH CONTRAST  TECHNIQUE: Multidetector CT imaging of the abdomen and pelvis was performed using the standard protocol following bolus  administration of intravenous contrast.  CONTRAST:  133m OMNIPAQUE IOHEXOL 300 MG/ML  SOLN  COMPARISON:  09/16/2013  FINDINGS: Lower chest: No pleural effusion identified. No pericardial effusion.  Hepatobiliary: There is no suspicious liver abnormality. Prior cholecystectomy. No biliary dilatation.  Pancreas: Normal appearance of the pancreas.  Spleen: The spleen is normal.  Adrenals/Urinary Tract: The adrenal glands are both within normal limits. Normal appearance of both kidneys. The urinary bladder appears within normal limits.  Stomach/Bowel: The stomach appears normal. There is abnormal increased dilatation of the proximal small bowel loops which measure 5.1 cm, image 49/ series 5. There is fecalization of the small bowel contents. Transition to relative normal caliber small bowel loops are noted in the right abdomen, image 53/series 5. Some enteric contrast material can be seen distal to the relative transition point. There has been interval reversal of the patient's colostomy. The large bowel loops have a normal caliber without evidence for obstruction.  Vascular/Lymphatic: Calcified atherosclerotic disease involves the abdominal aorta. No aneurysm. No enlarged retroperitoneal or mesenteric adenopathy. No enlarged pelvic or inguinal lymph nodes.  Reproductive: Previous hysterectomy.  No adnexal mass identified.  Other: No free fluid or fluid collections within the abdomen or pelvis. There is a ventral abdominal wall hernia which contains nonobstructed loops of bowel, image 44/series 2.  Musculoskeletal: There are no aggressive lytic or sclerotic bone lesions. Scoliosis and degenerative disc disease noted.  IMPRESSION: 1. Small bowel dilatation with relative transition point in the distal small bowel within the right side of abdomen. Findings are worrisome for partial small bowel obstruction perhaps due to chronic postoperative change/adhesions. 2. No bowel perforation. 3. Aortic atherosclerosis 4. Ventral  abdominal wall hernia containing nonobstructed loops of small bowel. 5. Prior cholecystectomy.   Electronically Signed   By: TKerby MoorsM.D.   On: 08/29/2014 19:06      Assessment/Plan Principal Problem:   SBO (small bowel obstruction) Active Problems:   Hyperlipidemia, mixed   HTN (hypertension)   Anemia   Chronic diastolic CHF (congestive heart failure), NYHA class 2   1. SBO -will keep NPO for now monitor electrolytes -Surgery suggested holding off on NG tube at this time unless vomiting develops and will get a KUB in am -ED spoke with surgery and will follow  2. HTN -will continue with antihypertensives -monitor pressures  3. HLD -on lipitor which will be continued -check lipid panel  4. Anemia -will monitor labs -continue with iron  5. CHF Class II -currently compensated  6. Depression -will continue with zoloft and remeron     Code Status: Full Code (must indicate code status--if unknown or must be presumed, indicate so) DVT Prophylaxis:SCD Family Communication: none (indicate person spoken with, if applicable, with phone number if by telephone) Disposition Plan: SNF (indicate anticipated LOS)  Time spent: 649m  Ashkan Chamberland A [facility] Pager [phone]

## 2014-08-29 NOTE — Consult Note (Signed)
Reason for Consult: Possible partial small bowel obstruction Referring Physician: Dr. [name] is an 74 y.o. female.  HPI: Patient is a 74 year old female who underwent colostomy reversal per Dr. Ninfa Linden on 01/24/2014. She had presented several months later with a colonic stricture at our institution. She underwent an exploratory laparotomy with a colon resection and colostomy. She also had to have a small bowel resected for multiple enterotomies that she had dense adhesions.  Patient comes in today secondary to abdominal pain which started in the last 12 hours. She states that she did have 2 bowel movements this morning and has continued to pass gas. Patient has had no nausea or vomiting.  Patient underwent CT scan which revealed possible partial small bowel obstruction with dilated loops small bowel fistulization of small bowel.  Of note patient recently underwent a carotid endarterectomy 08/08/2014 in which she suffered a postoperative stroke.  Past Medical History  Diagnosis Date  . Carotid artery disease     a. 60-70% bilat ICA stenosis by dopplers 05/2012.  Marland Kitchen Other and unspecified hyperlipidemia     takes Lipitor daily  . CAD (coronary artery disease)     a. Mod dz 2010 initially mgd medically. b. 12/2012 - angina s/p PTCA/DES to mid-circumflex, PTCA/DES to first OM.   Marland Kitchen Barrett's esophagus   . SBO (small bowel obstruction)     a. Lysis of adhesions and ovarian cystectomy 04/2013 for sBO. b. Admitted 07/2013 for colonic obstruction due to suspected colitis, s/p partial colectomy/colostomy 08/01/13. Admission complicated by anasarca, acute resp failure requiring tracheostomy, decannulated 08/25/13. c. Recurrent SBO8/2015, NGT placed for decompression.   . Colonic obstruction due to suspected colitis; s/p colectomy/colostomy     a. See SBO.  Marland Kitchen Protein-calorie malnutrition, severe w/ electrolyte imbalance     a. Severe hypoalbuminemia leading to 3rd spacing including  anasarca and pulm edema 07/2013.  Marland Kitchen DVT of axillary vein, acute right     a. Dx 07/2013 felt due to R IJ central line that had been inserted 7/14. Not on anticoag due to GIB issues and small cerebral bleed.  . Acute respiratory failure with hypoxia s/p tracheostomy 07/2013  . Intracranial hemorrhage     a. 08/11/13 - per DC summary, tiny SAH vs SDH done for altered mentation, anticoag stopped including aspirin.  . Anemia     a. 7-08/2013 felt due to recent critical illness/chronic disease.  . Complication of anesthesia     hard to wake up from anesthesia   . Hypertension     takes Metoprolol daily  . HTN (hypertension)   . Heart murmur   . COPD (chronic obstructive pulmonary disease)   . Pneumonia     hx of->15 yrs ago  . History of bronchitis     > 27yrs ago   . Dizziness     was on Bentyl which caused this-took off of it and no problems since  . Stroke early 80's    right sided weakness--TIA  . Joint pain   . Chronic back pain     deteriorating;DDD  . Neck pain     DDD  . History of hiatal hernia   . History of colon polyps   . History of kidney stones     was told it was stable but doesn't know for sure that she ever passed it  . Depression     takes Remeron and Zoloft daily  . GERD (gastroesophageal reflux disease)   . Arthritis  handds, knees & back     Past Surgical History  Procedure Laterality Date  . Appendectomy    . Cholecystectomy    . Colon resection    . 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 Rotator cuff repair Left   . Abdominal hysterectomy    . Colon surgery    . Foot surgery Left   . Hand surgery Bilateral     for ganglion cysts  . Laparotomy N/A 08/01/2013    Procedure: EXPLORATORY LAPAROTOMY;  Surgeon: Harl Bowie, MD;  Location: [place];  Service: General;  Laterality: N/A;  . Partial colectomy N/A 08/01/2013    Procedure: PARTIAL COLECTOMY;  Surgeon: Harl Bowie, MD;  Location: [place];  Service: General;  Laterality: N/A;  . Colostomy N/A 08/01/2013    Procedure: COLOSTOMY;  Surgeon: Harl Bowie, MD;  Location: [place];  Service: General;  Laterality: N/A;  . Bowel resection N/A 08/01/2013    Procedure: SMALL BOWEL RESECTION;  Surgeon: Harl Bowie, MD;  Location: [place];  Service: General;  Laterality: N/A;  . Tracheostomy      feinstein  . Left heart catheterization with coronary angiogram N/A 12/20/2012    Procedure: LEFT HEART CATHETERIZATION WITH CORONARY ANGIOGRAM;  Surgeon: Blane Ohara, MD;  Location: [facility] CATH LAB;  Service: Cardiovascular;  Laterality: N/A;  . Cataract surgery Bilateral   . Colostomy takedown  01/24/2014    dr Ninfa Linden  . Colostomy takedown N/A 01/24/2014    Procedure: COLOSTOMY TAKEDOWN;  Surgeon: Coralie Keens, MD;  Location: [name];  Service: General;  Laterality: N/A;  . Tonsillectomy    . Endarterectomy Right 08/08/2014    Procedure: RIGHT CAROTID ENDARTERECTOMY WITH HEMASHIELD PATCH ANGIOPLASTY;  Surgeon: Elam Dutch, MD;  Location: [facility] OR;  Service: Vascular;  Laterality: Right;    Family History  Problem Relation Age of Onset  . Heart disease    . Varicose Veins Mother   . Heart disease Father   . Heart attack Father   . AAA (abdominal aortic aneurysm) Father     Social History:  reports that she has been smoking Cigarettes.  She has a 26.5 pack-year smoking history. She has never used smokeless tobacco. She reports that she does not drink alcohol or use illicit drugs.  Allergies:  Allergies  Allergen Reactions  . Pregabalin Swelling    Tongue swelling  . Sulfonamide Derivatives Swelling    Childhood reaction    Medications: I have reviewed the patient's current medications.  Results for orders placed or performed during the hospital encounter of 08/29/14 (from the past 48 hour(s))  Lipase, blood     Status: Abnormal   Collection Time: 08/29/14  5:42 PM  Result Value Ref Range   Lipase 15 (L) 22 - 51  U/L  Comprehensive metabolic panel     Status: Abnormal   Collection Time: 08/29/14  5:42 PM  Result Value Ref Range   Sodium 142 135 - 145 mmol/L   Potassium 4.1 3.5 - 5.1 mmol/L   Chloride 111 101 - 111 mmol/L   CO2 20 (L) 22 - 32 mmol/L   Glucose, Bld 120 (H) 65 - 99 mg/dL   BUN 13 6 - 20 mg/dL   Creatinine, Ser 0.66 0.44 - 1.00 mg/dL   Calcium 9.1 8.9 - 10.3 mg/dL   Total Protein 6.6 6.5 - 8.1 g/dL  Albumin 3.6 3.5 - 5.0 g/dL   AST 21 15 - 41 U/L   ALT 15 14 - 54 U/L   Alkaline Phosphatase 94 38 - 126 U/L   Total Bilirubin 0.3 0.3 - 1.2 mg/dL   GFR calc non Af Amer >60 >60 mL/min   GFR calc Af Amer >60 >60 mL/min    Comment: (NOTE) The eGFR has been calculated using the CKD EPI equation. This calculation has not been validated in all clinical situations. eGFR's persistently <60 mL/min signify possible Chronic Kidney Disease.    Anion gap 11 5 - 15  CBC     Status: Abnormal   Collection Time: 08/29/14  5:42 PM  Result Value Ref Range   WBC 12.9 (H) 4.0 - 10.5 K/uL   RBC 3.80 (L) 3.87 - 5.11 MIL/uL   Hemoglobin 8.4 (L) 12.0 - 15.0 g/dL   HCT 31.3 (L) 36.0 - 46.0 %   MCV 82.4 78.0 - 100.0 fL   MCH 22.1 (L) 26.0 - 34.0 pg   MCHC 26.8 (L) 30.0 - 36.0 g/dL   RDW 16.1 (H) 11.5 - 15.5 %   Platelets 263 150 - 400 K/uL  I-stat troponin, ED     Status: None   Collection Time: 08/29/14  5:48 PM  Result Value Ref Range   Troponin i, poc 0.00 0.00 - 0.08 ng/mL   Comment 3            Comment: Due to the release kinetics of cTnI, a negative result within the first hours of the onset of symptoms does not rule out myocardial infarction with certainty. If myocardial infarction is still suspected, repeat the test at appropriate intervals.   Urinalysis, Routine w reflex microscopic (not at [facility])     Status: Abnormal   Collection Time: 08/29/14  6:56 PM  Result Value Ref Range   Color, Urine YELLOW YELLOW   APPearance CLEAR CLEAR   Specific Gravity, Urine 1.020 1.005 - 1.030    pH 6.0 5.0 - 8.0   Glucose, UA NEGATIVE NEGATIVE mg/dL   Hgb urine dipstick NEGATIVE NEGATIVE   Bilirubin Urine NEGATIVE NEGATIVE   Ketones, ur NEGATIVE NEGATIVE mg/dL   Protein, ur NEGATIVE NEGATIVE mg/dL   Urobilinogen, UA 0.2 0.0 - 1.0 mg/dL   Nitrite NEGATIVE NEGATIVE   Leukocytes, UA SMALL (A) NEGATIVE  Urine microscopic-add on     Status: Abnormal   Collection Time: 08/29/14  6:56 PM  Result Value Ref Range   Squamous Epithelial / LPF FEW (A) RARE   WBC, UA 0-2 <3 WBC/hpf   RBC / HPF 0-2 <3 RBC/hpf   Urine-Other MUCOUS PRESENT     Ct Abdomen Pelvis W Contrast  08/29/2014   CLINICAL DATA:  74 year old female with abdominal pain and nausea.  EXAM: CT ABDOMEN AND PELVIS WITH CONTRAST  TECHNIQUE: Multidetector CT imaging of the abdomen and pelvis was performed using the standard protocol following bolus administration of intravenous contrast.  CONTRAST:  165mL OMNIPAQUE IOHEXOL 300 MG/ML  SOLN  COMPARISON:  09/16/2013  FINDINGS: Lower chest: No pleural effusion identified. No pericardial effusion.  Hepatobiliary: There is no suspicious liver abnormality. Prior cholecystectomy. No biliary dilatation.  Pancreas: Normal appearance of the pancreas.  Spleen: The spleen is normal.  Adrenals/Urinary Tract: The adrenal glands are both within normal limits. Normal appearance of both kidneys. The urinary bladder appears within normal limits.  Stomach/Bowel: The stomach appears normal. There is abnormal increased dilatation of the proximal small bowel loops which measure  5.1 cm, image 49/ series 5. There is fecalization of the small bowel contents. Transition to relative normal caliber small bowel loops are noted in the right abdomen, image 53/series 5. Some enteric contrast material can be seen distal to the relative transition point. There has been interval reversal of the patient's colostomy. The large bowel loops have a normal caliber without evidence for obstruction.  Vascular/Lymphatic: Calcified  atherosclerotic disease involves the abdominal aorta. No aneurysm. No enlarged retroperitoneal or mesenteric adenopathy. No enlarged pelvic or inguinal lymph nodes.  Reproductive: Previous hysterectomy.  No adnexal mass identified.  Other: No free fluid or fluid collections within the abdomen or pelvis. There is a ventral abdominal wall hernia which contains nonobstructed loops of bowel, image 44/series 2.  Musculoskeletal: There are no aggressive lytic or sclerotic bone lesions. Scoliosis and degenerative disc disease noted.  IMPRESSION: 1. Small bowel dilatation with relative transition point in the distal small bowel within the right side of abdomen. Findings are worrisome for partial small bowel obstruction perhaps due to chronic postoperative change/adhesions. 2. No bowel perforation. 3. Aortic atherosclerosis 4. Ventral abdominal wall hernia containing nonobstructed loops of small bowel. 5. Prior cholecystectomy.   Electronically Signed   By: Kerby Moors M.D.   On: 08/29/2014 19:06    Review of Systems  Constitutional: Negative.   HENT: Negative.   Cardiovascular: Negative.   Gastrointestinal: Positive for nausea and abdominal pain. Negative for vomiting, diarrhea and constipation.  Musculoskeletal: Negative.   Skin: Negative.   Neurological: Negative.    Blood pressure 105/47, pulse 105, temperature 98.1 F (36.7 C), temperature source Oral, resp. rate 18, SpO2 96 %. Physical Exam  Constitutional: She is oriented to person, place, and time. She appears well-developed and well-nourished.  HENT:  Head: Normocephalic and atraumatic.  Eyes: Conjunctivae and EOM are normal. Pupils are equal, round, and reactive to light.  Neck: Normal range of motion. Neck supple.  Cardiovascular: Normal rate, regular rhythm and normal heart sounds.   Respiratory: Effort normal and breath sounds normal.  GI: Soft. Bowel sounds are normal. She exhibits no distension. There is no tenderness. There is no  rebound.  Musculoskeletal: Normal range of motion.  Neurological: She is alert and oriented to person, place, and time.    Assessment/Plan: 74 year old female with partial small bowel fashion.  1. Recommend medical admission secondary multiple medical issues 2. We'll hold off on NG tube at this point as patient has had no nausea or vomiting. She does be a nausea vomiting will place an NG tube. 3. We'll reassess her in the a.m. with a KUB.  [name]., [name] 08/29/2014, 10:09 PM

## 2014-08-30 ENCOUNTER — Inpatient Hospital Stay (HOSPITAL_COMMUNITY): Payer: Commercial Managed Care - HMO

## 2014-08-30 DIAGNOSIS — K5669 Other intestinal obstruction: Secondary | ICD-10-CM

## 2014-08-30 DIAGNOSIS — I10 Essential (primary) hypertension: Secondary | ICD-10-CM

## 2014-08-30 DIAGNOSIS — I5032 Chronic diastolic (congestive) heart failure: Secondary | ICD-10-CM

## 2014-08-30 DIAGNOSIS — E782 Mixed hyperlipidemia: Secondary | ICD-10-CM

## 2014-08-30 LAB — CBC
HCT: 30 % — ABNORMAL LOW (ref 36.0–46.0)
Hemoglobin: 9.5 g/dL — ABNORMAL LOW (ref 12.0–15.0)
MCH: 25.7 pg — AB (ref 26.0–34.0)
MCHC: 31.7 g/dL (ref 30.0–36.0)
MCV: 81.3 fL (ref 78.0–100.0)
PLATELETS: 233 10*3/uL (ref 150–400)
RBC: 3.69 MIL/uL — ABNORMAL LOW (ref 3.87–5.11)
RDW: 16.1 % — ABNORMAL HIGH (ref 11.5–15.5)
WBC: 5.8 10*3/uL (ref 4.0–10.5)

## 2014-08-30 LAB — COMPREHENSIVE METABOLIC PANEL
ALBUMIN: 3 g/dL — AB (ref 3.5–5.0)
ALK PHOS: 90 U/L (ref 38–126)
ALT: 18 U/L (ref 14–54)
AST: 28 U/L (ref 15–41)
Anion gap: 7 (ref 5–15)
BILIRUBIN TOTAL: 0.5 mg/dL (ref 0.3–1.2)
BUN: 10 mg/dL (ref 6–20)
CHLORIDE: 110 mmol/L (ref 101–111)
CO2: 25 mmol/L (ref 22–32)
Calcium: 8.8 mg/dL — ABNORMAL LOW (ref 8.9–10.3)
Creatinine, Ser: 0.7 mg/dL (ref 0.44–1.00)
GFR calc Af Amer: >60 mL/min (ref >60)
GLUCOSE: 93 mg/dL (ref 65–99)
POTASSIUM: 3.9 mmol/L (ref 3.5–5.1)
Sodium: 142 mmol/L (ref 135–145)
Total Protein: 5.9 g/dL — ABNORMAL LOW (ref 6.5–8.1)

## 2014-08-30 LAB — TSH: TSH: 2.724 u[IU]/mL (ref 0.350–4.500)

## 2014-08-30 MED ORDER — DOCUSATE SODIUM 100 MG PO CAPS
100.0000 mg | ORAL_CAPSULE | Freq: Two times a day (BID) | ORAL | Status: DC
  Administered 2014-08-30 – 2014-08-31 (×3): 100 mg via ORAL
  Filled 2014-08-30 (×3): qty 1

## 2014-08-30 MED ORDER — BACLOFEN 10 MG PO TABS: 10.0000 mg | ORAL_TABLET | Freq: Every day | ORAL | Status: DC | PRN

## 2014-08-30 MED ORDER — SODIUM CHLORIDE 0.9 % IJ SOLN
3.0000 mL | Freq: Two times a day (BID) | INTRAMUSCULAR | Status: DC
  Administered 2014-08-30 – 2014-08-31 (×3): 3 mL via INTRAVENOUS

## 2014-08-30 MED ORDER — POLYSACCHARIDE IRON COMPLEX 150 MG PO CAPS
150.0000 mg | ORAL_CAPSULE | Freq: Two times a day (BID) | ORAL | Status: DC
  Administered 2014-08-30 – 2014-08-31 (×2): 150 mg via ORAL
  Filled 2014-08-30 (×5): qty 1

## 2014-08-30 MED ORDER — PANTOPRAZOLE SODIUM 40 MG PO TBEC
40.0000 mg | DELAYED_RELEASE_TABLET | Freq: Two times a day (BID) | ORAL | Status: DC
  Administered 2014-08-30 – 2014-08-31 (×3): 40 mg via ORAL
  Filled 2014-08-30 (×3): qty 1

## 2014-08-30 MED ORDER — BISACODYL 10 MG RE SUPP
10.0000 mg | Freq: Every day | RECTAL | Status: DC
  Administered 2014-08-30 – 2014-08-31 (×2): 10 mg via RECTAL
  Filled 2014-08-30 (×2): qty 1

## 2014-08-30 MED ORDER — SERTRALINE HCL 100 MG PO TABS
100.0000 mg | ORAL_TABLET | Freq: Two times a day (BID) | ORAL | Status: DC
  Administered 2014-08-30 – 2014-08-31 (×3): 100 mg via ORAL
  Filled 2014-08-30 (×3): qty 1

## 2014-08-30 MED ORDER — ONDANSETRON HCL 4 MG PO TABS
4.0000 mg | ORAL_TABLET | Freq: Four times a day (QID) | ORAL | Status: DC | PRN
Start: 2014-08-30 — End: 2014-08-31

## 2014-08-30 MED ORDER — FOLIC ACID 800 MCG PO TABS: 800.0000 ug | ORAL_TABLET | Freq: Every day | ORAL | Status: DC

## 2014-08-30 MED ORDER — NITROGLYCERIN 0.4 MG SL SUBL: 0.4000 mg | SUBLINGUAL_TABLET | SUBLINGUAL | Status: DC | PRN

## 2014-08-30 MED ORDER — ADULT MULTIVITAMIN W/MINERALS CH
1.0000 | ORAL_TABLET | Freq: Every day | ORAL | Status: DC
  Administered 2014-08-30 – 2014-08-31 (×2): 1 via ORAL
  Filled 2014-08-30 (×2): qty 1

## 2014-08-30 MED ORDER — OMEGA-3-ACID ETHYL ESTERS 1 G PO CAPS
1.0000 g | ORAL_CAPSULE | Freq: Every day | ORAL | Status: DC
Start: 2014-08-30 — End: 2014-08-31
  Administered 2014-08-30 – 2014-08-31 (×2): 1 g via ORAL
  Filled 2014-08-30 (×2): qty 1

## 2014-08-30 MED ORDER — METOPROLOL SUCCINATE ER 25 MG PO TB24
25.0000 mg | ORAL_TABLET | Freq: Every day | ORAL | Status: DC
  Administered 2014-08-30 – 2014-08-31 (×2): 25 mg via ORAL
  Filled 2014-08-30 (×2): qty 1

## 2014-08-30 MED ORDER — OXYCODONE HCL 5 MG PO TABS
5.0000 mg | ORAL_TABLET | Freq: Four times a day (QID) | ORAL | Status: DC | PRN
  Administered 2014-08-30: 5 mg via ORAL
  Filled 2014-08-30: qty 1

## 2014-08-30 MED ORDER — FLEET ENEMA 7-19 GM/118ML RE ENEM: 1.0000 | ENEMA | Freq: Every day | RECTAL | Status: DC | PRN

## 2014-08-30 MED ORDER — SODIUM CHLORIDE 0.9 % IJ SOLN: 3.0000 mL | INTRAMUSCULAR | Status: DC | PRN

## 2014-08-30 MED ORDER — FOLIC ACID 1 MG PO TABS
1.0000 mg | ORAL_TABLET | Freq: Every day | ORAL | Status: DC
  Administered 2014-08-30 – 2014-08-31 (×2): 1 mg via ORAL
  Filled 2014-08-30 (×2): qty 1

## 2014-08-30 MED ORDER — MIRTAZAPINE 15 MG PO TABS
30.0000 mg | ORAL_TABLET | Freq: Every day | ORAL | Status: DC
  Administered 2014-08-30: 30 mg via ORAL
  Filled 2014-08-30: qty 2

## 2014-08-30 MED ORDER — ATORVASTATIN CALCIUM 80 MG PO TABS
80.0000 mg | ORAL_TABLET | Freq: Every evening | ORAL | Status: DC
  Administered 2014-08-30: 80 mg via ORAL
  Filled 2014-08-30: qty 1

## 2014-08-30 MED ORDER — ACETAMINOPHEN 650 MG RE SUPP: 650.0000 mg | Freq: Four times a day (QID) | RECTAL | Status: DC | PRN

## 2014-08-30 MED ORDER — ACETAMINOPHEN 325 MG PO TABS: 650.0000 mg | ORAL_TABLET | Freq: Four times a day (QID) | ORAL | Status: DC | PRN

## 2014-08-30 MED ORDER — SODIUM CHLORIDE 0.9 % IV SOLN: 250.0000 mL | INTRAVENOUS | Status: DC | PRN

## 2014-08-30 MED ORDER — ONDANSETRON HCL 4 MG/2ML IJ SOLN: 4.0000 mg | Freq: Four times a day (QID) | INTRAMUSCULAR | Status: DC | PRN

## 2014-08-30 MED ORDER — POLYETHYLENE GLYCOL 3350 17 G PO PACK
17.0000 g | PACK | Freq: Every day | ORAL | Status: DC
  Administered 2014-08-30 – 2014-08-31 (×2): 17 g via ORAL
  Filled 2014-08-30 (×2): qty 1

## 2014-08-30 MED ORDER — ASPIRIN EC 81 MG PO TBEC
162.0000 mg | DELAYED_RELEASE_TABLET | Freq: Every day | ORAL | Status: DC
  Administered 2014-08-30 – 2014-08-31 (×2): 162 mg via ORAL
  Filled 2014-08-30 (×2): qty 2

## 2014-08-30 MED ORDER — VITAMIN B-1 100 MG PO TABS
100.0000 mg | ORAL_TABLET | Freq: Every day | ORAL | Status: DC
  Administered 2014-08-30 – 2014-08-31 (×2): 100 mg via ORAL
  Filled 2014-08-30 (×2): qty 1

## 2014-08-30 MED ORDER — OMEGA-3 FATTY ACIDS 1000 MG PO CAPS: 1000.0000 mg | ORAL_CAPSULE | Freq: Every day | ORAL | Status: DC

## 2014-08-30 NOTE — Progress Notes (Signed)
[facility] Progress Note     Subjective: Pt has no pain in her abdomen, no N/V.  C/o only left sided neck pain and a headache.  Hungry/thirsty.  Wants to go home.  Having flatus, no BM yet.    Objective: Vital signs in last 24 hours: Temp:  [98 F (36.7 C)-99 F (37.2 C)] 98 F (36.7 C) (08/10 0940) Pulse Rate:  [67-157] 67 (08/10 0940) Resp:  [15-25] 16 (08/10 0940) BP: (94-138)/(34-62) 122/40 mmHg (08/10 0940) SpO2:  [94 %-100 %] 97 % (08/10 0940) Weight:  [68.312 kg (150 lb 9.6 oz)] 68.312 kg (150 lb 9.6 oz) (08/10 0532) Last BM Date: 08/29/14  Intake/Output from previous day: 08/09 0701 - 08/10 0700 In: 1000 [I.V.:1000] Out: -  Intake/Output this shift:     PE: Gen:  Alert, NAD, pleasant Abd: Soft, NT/ND, +BS, no HSM, abdominal scars noted and well healed   Lab Results:   Recent Labs  08/29/14 1742 08/30/14 0708  WBC 12.9* 5.8  HGB 8.4* 9.5*  HCT 31.3* 30.0*  PLT 263 233   BMET  Recent Labs  08/29/14 1742 08/30/14 0708  NA 142 142  K 4.1 3.9  CL 111 110  CO2 20* 25  GLUCOSE 120* 93  BUN 13 10  CREATININE 0.66 0.70  CALCIUM 9.1 8.8*   PT/INR No results for input(s): LABPROT, INR in the last 72 hours. CMP     Component Value Date/Time   NA 142 08/30/2014 0708   K 3.9 08/30/2014 0708   CL 110 08/30/2014 0708   CO2 25 08/30/2014 0708   GLUCOSE 93 08/30/2014 0708   BUN 10 08/30/2014 0708   CREATININE 0.70 08/30/2014 0708   CALCIUM 8.8* 08/30/2014 0708   PROT 5.9* 08/30/2014 0708   ALBUMIN 3.0* 08/30/2014 0708   AST 28 08/30/2014 0708   ALT 18 08/30/2014 0708   ALKPHOS 90 08/30/2014 0708   BILITOT 0.5 08/30/2014 0708   GFRNONAA >60 08/30/2014 0708   GFRAA >60 08/30/2014 0708   Lipase     Component Value Date/Time   LIPASE 15* 08/29/2014 1742       Studies/Results: Ct Abdomen Pelvis W Contrast  08/29/2014   CLINICAL DATA:  74 year old female with abdominal pain and nausea.  EXAM: CT ABDOMEN AND PELVIS WITH CONTRAST   TECHNIQUE: Multidetector CT imaging of the abdomen and pelvis was performed using the standard protocol following bolus administration of intravenous contrast.  CONTRAST:  112m OMNIPAQUE IOHEXOL 300 MG/ML  SOLN  COMPARISON:  09/16/2013  FINDINGS: Lower chest: No pleural effusion identified. No pericardial effusion.  Hepatobiliary: There is no suspicious liver abnormality. Prior cholecystectomy. No biliary dilatation.  Pancreas: Normal appearance of the pancreas.  Spleen: The spleen is normal.  Adrenals/Urinary Tract: The adrenal glands are both within normal limits. Normal appearance of both kidneys. The urinary bladder appears within normal limits.  Stomach/Bowel: The stomach appears normal. There is abnormal increased dilatation of the proximal small bowel loops which measure 5.1 cm, image 49/ series 5. There is fecalization of the small bowel contents. Transition to relative normal caliber small bowel loops are noted in the right abdomen, image 53/series 5. Some enteric contrast material can be seen distal to the relative transition point. There has been interval reversal of the patient's colostomy. The large bowel loops have a normal caliber without evidence for obstruction.  Vascular/Lymphatic: Calcified atherosclerotic disease involves the abdominal aorta. No aneurysm. No enlarged retroperitoneal or mesenteric adenopathy. No enlarged pelvic or inguinal lymph  nodes.  Reproductive: Previous hysterectomy.  No adnexal mass identified.  Other: No free fluid or fluid collections within the abdomen or pelvis. There is a ventral abdominal wall hernia which contains nonobstructed loops of bowel, image 44/series 2.  Musculoskeletal: There are no aggressive lytic or sclerotic bone lesions. Scoliosis and degenerative disc disease noted.  IMPRESSION: 1. Small bowel dilatation with relative transition point in the distal small bowel within the right side of abdomen. Findings are worrisome for partial small bowel  obstruction perhaps due to chronic postoperative change/adhesions. 2. No bowel perforation. 3. Aortic atherosclerosis 4. Ventral abdominal wall hernia containing nonobstructed loops of small bowel. 5. Prior cholecystectomy.   Electronically Signed   By: Kerby Moors M.D.   On: 08/29/2014 19:06   Dg Abd Portable 1v  08/30/2014   CLINICAL DATA:  Acute onset of abdominal pain. Assess for small bowel obstruction. Initial encounter.  EXAM: PORTABLE ABDOMEN - 1 VIEW  COMPARISON:  None.  FINDINGS: The visualized bowel gas pattern is grossly unremarkable. Contrast is seen within the cecum and ascending colon, suggesting successful passage of contrast since a recent prior study. No free intra-abdominal air is identified, though evaluation for free air is limited on a single supine view.  Contrast is seen filling the bladder. No acute osseous abnormalities are identified. Degenerative change is noted at the lower lumbar spine. Clips are noted within the right upper quadrant, reflecting prior cholecystectomy.  IMPRESSION: Unremarkable bowel gas pattern; no free intra-abdominal air seen. Contrast seen within the cecum and ascending colon, suggesting successful passage of contrast since a recent prior study. No evidence for bowel obstruction.   Electronically Signed   By: Garald Balding M.D.   On: 08/30/2014 01:38    Anti-infectives: Anti-infectives    None       Assessment/Plan Abdominal pain concern for pSBO -KUB with normal bowel gas pattern, contrast in colon going against obstruction, leukocytosis resolved -Would slowly advance diet, clears now, fulls dinner, advance to regular in AM if tolerating -Ambulate and IS -Start bowel regimen including dulcolax and enema as needed to help with stool burdon -Can follow up with Dr. Ninfa Linden in the office as needed  H/o colostomy reversal 01/24/14, then Ex lap, colon resection and colostomy, SBR's 08/01/13 (Dr. Ninfa Linden) Headache/neck pain - per primary  service   LOS: 1 day    [name] 08/30/2014, 10:22 AM Pager: [phone]

## 2014-08-30 NOTE — Progress Notes (Signed)
[facility] PROGRESS NOTE  [name]:[id number] DOB: 1940-04-16 DOA: 08/29/2014 PCP: Laverna Peace, NP  Assessment/Plan: 1. Partial SBO/ileus -appears to be secondary to constipation -repeated abd x-ray with normal gas pattern -patient w/o pain, nausea or vomiting. Also passing gas -will start bowel regimen following CCS rec's and will advance diet -most likely home in am  2. HTN -will continue with antihypertensives -BP is stable/well controlled   3. HLD -on lipitor which will be continued  4. Anemia -will monitor labs -continue with iron -no overt bleeding   5. Chronic diastolic heart failure Class II -currently compensated -will follow I's and O's and daily weights -plan is to resume heart healthy/low sodium diet once tolerating PO's  6. Recent CVA and right carotid endarterectomy  -will continue ASA and statins -no new deficit  7. Depression -will continue with zoloft and Remeron  Code Status: Full Family Communication: husband at bedside Disposition Plan: home in am (most likely 8/11); advancing diet and following tolerance   Consultants:  CCS  Procedures:  See below for x-ray reports   Antibiotics:  None   HPI/Subjective: Afebrile, AAOX3, denies CP, SOB, nausea, vomiting and abd pain.   Objective: Filed Vitals:   08/30/14 0940  BP: 122/40  Pulse: 67  Temp: 98 F (36.7 C)  Resp: 16    Intake/Output Summary (Last 24 hours) at 08/30/14 1222 Last data filed at 08/30/14 1217  Gross per 24 hour  Intake   1120 ml  Output      0 ml  Net   1120 ml   Filed Weights   08/30/14 0532  Weight: 68.312 kg (150 lb 9.6 oz)    Exam:   General:  Afebrile, reports no abd pain, no nausea and no vomiting. Patient is passing gas.  Cardiovascular: no CP, S1 and S2, no rubs or gallops  Respiratory: CTA bilaterally  Abdomen: soft, no distended, no tenderness, decrease but present BS  Musculoskeletal: no edema, no cyanosis, no  clubbing   Data Reviewed: Basic Metabolic Panel:  Recent Labs Lab 08/29/14 1742 08/30/14 0708  NA 142 142  K 4.1 3.9  CL 111 110  CO2 20* 25  GLUCOSE 120* 93  BUN 13 10  CREATININE 0.66 0.70  CALCIUM 9.1 8.8*   Liver Function Tests:  Recent Labs Lab 08/29/14 1742 08/30/14 0708  AST 21 28  ALT 15 18  ALKPHOS 94 90  BILITOT 0.3 0.5  PROT 6.6 5.9*  ALBUMIN 3.6 3.0*    Recent Labs Lab 08/29/14 1742  LIPASE 15*   CBC:  Recent Labs Lab 08/29/14 1742 08/30/14 0708  WBC 12.9* 5.8  HGB 8.4* 9.5*  HCT 31.3* 30.0*  MCV 82.4 81.3  PLT 263 233   BNP (last 3 results)  Recent Labs  01/29/14 0433  BNP 219.9*    ProBNP (last 3 results)  Recent Labs  10/20/13 1550  PROBNP 379.5*    Studies: Ct Abdomen Pelvis W Contrast  08/29/2014   CLINICAL DATA:  74 year old female with abdominal pain and nausea.  EXAM: CT ABDOMEN AND PELVIS WITH CONTRAST  TECHNIQUE: Multidetector CT imaging of the abdomen and pelvis was performed using the standard protocol following bolus administration of intravenous contrast.  CONTRAST:  131m OMNIPAQUE IOHEXOL 300 MG/ML  SOLN  COMPARISON:  09/16/2013  FINDINGS: Lower chest: No pleural effusion identified. No pericardial effusion.  Hepatobiliary: There is no suspicious liver abnormality. Prior cholecystectomy. No biliary dilatation.  Pancreas: Normal appearance of the pancreas.  Spleen: The spleen  is normal.  Adrenals/Urinary Tract: The adrenal glands are both within normal limits. Normal appearance of both kidneys. The urinary bladder appears within normal limits.  Stomach/Bowel: The stomach appears normal. There is abnormal increased dilatation of the proximal small bowel loops which measure 5.1 cm, image 49/ series 5. There is fecalization of the small bowel contents. Transition to relative normal caliber small bowel loops are noted in the right abdomen, image 53/series 5. Some enteric contrast material can be seen distal to the relative  transition point. There has been interval reversal of the patient's colostomy. The large bowel loops have a normal caliber without evidence for obstruction.  Vascular/Lymphatic: Calcified atherosclerotic disease involves the abdominal aorta. No aneurysm. No enlarged retroperitoneal or mesenteric adenopathy. No enlarged pelvic or inguinal lymph nodes.  Reproductive: Previous hysterectomy.  No adnexal mass identified.  Other: No free fluid or fluid collections within the abdomen or pelvis. There is a ventral abdominal wall hernia which contains nonobstructed loops of bowel, image 44/series 2.  Musculoskeletal: There are no aggressive lytic or sclerotic bone lesions. Scoliosis and degenerative disc disease noted.  IMPRESSION: 1. Small bowel dilatation with relative transition point in the distal small bowel within the right side of abdomen. Findings are worrisome for partial small bowel obstruction perhaps due to chronic postoperative change/adhesions. 2. No bowel perforation. 3. Aortic atherosclerosis 4. Ventral abdominal wall hernia containing nonobstructed loops of small bowel. 5. Prior cholecystectomy.   Electronically Signed   By: Kerby Moors M.D.   On: 08/29/2014 19:06   Dg Abd Portable 1v  08/30/2014   CLINICAL DATA:  Acute onset of abdominal pain. Assess for small bowel obstruction. Initial encounter.  EXAM: PORTABLE ABDOMEN - 1 VIEW  COMPARISON:  None.  FINDINGS: The visualized bowel gas pattern is grossly unremarkable. Contrast is seen within the cecum and ascending colon, suggesting successful passage of contrast since a recent prior study. No free intra-abdominal air is identified, though evaluation for free air is limited on a single supine view.  Contrast is seen filling the bladder. No acute osseous abnormalities are identified. Degenerative change is noted at the lower lumbar spine. Clips are noted within the right upper quadrant, reflecting prior cholecystectomy.  IMPRESSION: Unremarkable bowel  gas pattern; no free intra-abdominal air seen. Contrast seen within the cecum and ascending colon, suggesting successful passage of contrast since a recent prior study. No evidence for bowel obstruction.   Electronically Signed   By: Garald Balding M.D.   On: 08/30/2014 01:38    Scheduled Meds: . aspirin EC  162 mg Oral Daily  . atorvastatin  80 mg Oral QPM  . bisacodyl  10 mg Rectal Daily  . docusate sodium  100 mg Oral BID  . folic acid  1 mg Oral Daily  . iron polysaccharides  150 mg Oral BID AC  . metoprolol succinate  25 mg Oral Daily  . mirtazapine  30 mg Oral QHS  . multivitamin with minerals  1 tablet Oral Daily  . omega-3 acid ethyl esters  1 g Oral Daily  . pantoprazole  40 mg Oral BID  . polyethylene glycol  17 g Oral Daily  . sertraline  100 mg Oral BID  . sodium chloride  3 mL Intravenous Q12H  . thiamine  100 mg Oral Daily   Continuous Infusions:   Principal Problem:   SBO (small bowel obstruction) Active Problems:   Hyperlipidemia, mixed   HTN (hypertension)   Anemia   Chronic diastolic CHF (congestive heart failure),  NYHA class 2   Time spent: 30 minutes    Barton Dubois  [facility] Pager [phone]. If 7PM-7AM, please contact night-coverage at [email or web address], password [facility] 08/30/2014, 12:22 PM  LOS: 1 day

## 2014-08-31 DIAGNOSIS — K5649 Other impaction of intestine: Secondary | ICD-10-CM

## 2014-08-31 DIAGNOSIS — D508 Other iron deficiency anemias: Secondary | ICD-10-CM

## 2014-08-31 LAB — HEMOGLOBIN A1C
Hgb A1c MFr Bld: 6.2 % — ABNORMAL HIGH (ref 4.8–5.6)
Mean Plasma Glucose: 131 mg/dL

## 2014-08-31 MED ORDER — POLYETHYLENE GLYCOL 3350 17 G PO PACK: 17.0000 g | PACK | Freq: Every day | ORAL | Status: DC

## 2014-08-31 NOTE — Progress Notes (Signed)
Patient is discharged from room 4N26 at this time. Alert and in stable condition. IV site d/c'd. Instructions read to patient and understanding verbalized. Left unit via wheelchair with husband and all belongings at side.

## 2014-08-31 NOTE — Discharge Summary (Signed)
Physician Discharge Summary  [name] [facility]:[id number] DOB: [birth year] DOA: 08/29/2014  PCP: Laverna Peace, NP  Admit date: 08/29/2014 Discharge date: 08/31/2014  Time spent: > 30 minutes  Recommendations for Outpatient Follow-up:  1. Repeat BMET to follow electrolytes and renal function   Discharge Diagnoses:  Partial SBO (small bowel obstruction)/Ileus Constipation Hyperlipidemia, mixed HTN (hypertension) Anemia of chronic disease Chronic diastolic CHF (congestive heart failure), NYHA class 2   Discharge Condition: stable and improved. Discharge home with family care and instructed to follow with PCP in 2 weeks.  Diet recommendation: heart healthy diet  Filed Weights   08/30/14 0532  Weight: 68.312 kg (150 lb 9.6 oz)    History of present illness:  74 y.o. female with history of HTN, HLD, CHF (chronic diastolic), hx of stroke, prior hx of bowel obstruction and colostomy (with reanastomosis in 01/24/13 by Dr. Ninfa Linden). Who presented to ED with abdominal pain. Patient had a carotid endarterectomy done on July 19 which was followed by a stroke post operatively. Patient has a residual Left hemiplegia more pronounced in the upper extremity (unchanged since stroke event). On day of admission she presented with abdominal pain and small liquid BM. No nausea, no vomiting, no fever, no chills. Work up in ED demonstrated partial SBO/ileus and patient was admitted for further evaluation/treatment.  Hospital Course:  1. Partial SBO/ileus -appears to be secondary to constipation -repeated abd x-ray with normal gas pattern and no obstruction -patient w/o pain, nausea or vomiting. Had good BM and is passing gas -per surgical team rec's will discharge home on bowel regimen -follow up with PCP in 2 weeks  2. HTN -will continue with current antihypertensives -BP is stable/well controlled  -advise to follow low sodium heart healthy Diet   3. HLD -on lipitor which will be  continued  4. Anemia -no signs of overt bleeding  -continue with iron -Hgb stable (9.5 at discharge)  5. Chronic diastolic heart failure Class II -compensated -advise to check daily weights -plan is to resume heart healthy/low sodium diet  -continue B-blocker  6. Recent CVA and right carotid endarterectomy -will continue ASA and statins -no new deficit  7. Depression -will continue with zoloft and Remeron  Procedures:  See below for x-ray reports   Consultations:  CCS  Discharge Exam: Filed Vitals:   08/31/14 1056  BP: 132/53  Pulse: 69  Temp: 98.2 F (36.8 C)  Resp: 20    General: afebrile, no further nausea, no vomiting and no abd pain. Patient had good BM overnight. tolerating diet and PO meds at discharge Cardiovascular: S1 and S2, no rubs or gallops, positive BS Respiratory: CTA bilaterally Abd: soft, NT, positive BS, no distension and no guarding   Discharge Instructions   Discharge Instructions    Discharge instructions    Complete by:  As directed   Maintain good hydration and increase fiber in your diet Take medications as prescribed Arranged follow up with PCP in 2 weeks Please follow a heart healthy diet          Current Discharge Medication List    START taking these medications   Details  polyethylene glycol (MIRALAX / GLYCOLAX) packet Take 17 g by mouth daily. Qty: 30 each, Refills: 1      CONTINUE these medications which have NOT CHANGED   Details  aspirin EC 81 MG tablet Take 162 mg by mouth daily.    atorvastatin (LIPITOR) 80 MG tablet Take 80 mg by mouth every evening.  baclofen (LIORESAL) 10 MG tablet Take 1 tablet (10 mg total) by mouth 3 (three) times daily. Qty: 60 each, Refills: 0    docusate sodium (COLACE) 100 MG capsule Take 100 mg by mouth 2 (two) times daily.    fish oil-omega-3 fatty acids 1000 MG capsule Take 1,000 mg by mouth daily.     folic acid (FOLVITE) 932 MCG tablet Take 800 mcg by mouth  daily.     iron polysaccharides (NIFEREX) 150 MG capsule Take 1 capsule (150 mg total) by mouth 2 (two) times daily before lunch and supper. Qty: 60 capsule, Refills: 1    metoprolol succinate (TOPROL-XL) 25 MG 24 hr tablet TAKE 1 TABLET BY MOUTH DAILY Qty: 90 tablet, Refills: 3    mirtazapine (REMERON) 30 MG tablet Take 1 tablet by mouth at bedtime.     nitroGLYCERIN (NITROSTAT) 0.4 MG SL tablet Place 1 tablet (0.4 mg total) under the tongue every 5 (five) minutes as needed for chest pain. Qty: 25 tablet, Refills: 3    ondansetron (ZOFRAN) 4 MG tablet Take 4 mg by mouth every 6 (six) hours as needed for nausea or vomiting.     oxyCODONE (ROXICODONE) 5 MG immediate release tablet Take 1 tablet (5 mg total) by mouth every 6 (six) hours as needed. Qty: 20 tablet, Refills: 0    pantoprazole (PROTONIX) 40 MG tablet TAKE 1 TABLET TWICE DAILY Qty: 180 tablet, Refills: 1    sertraline (ZOLOFT) 100 MG tablet Take 100 mg by mouth 2 (two) times daily.       Allergies  Allergen Reactions  . Pregabalin Swelling    Tongue swelling  . Sulfonamide Derivatives Swelling    Childhood reaction   Follow-up Information    Follow up with MOON,AMY, NP. Schedule an appointment as soon as possible for a visit in 2 weeks.   Specialty:  Internal Medicine   Contact information:   [place], [place] [address] [phone]       The results of significant diagnostics from this hospitalization (including imaging, microbiology, ancillary and laboratory) are listed below for reference.    Significant Diagnostic Studies: Dg Chest 2 View  08/08/2014   CLINICAL DATA:  Preoperative chest radiograph for carotid endarterectomy. Initial encounter.  EXAM: CHEST  2 VIEW  COMPARISON:  Chest radiograph performed 02/01/2014  FINDINGS: The lungs are well-aerated. Mild bibasilar atelectasis is noted. There is no evidence of pleural effusion or pneumothorax.  The heart is borderline normal in  size. No acute osseous abnormalities are seen. Clips are noted within the right upper quadrant, reflecting prior cholecystectomy.  IMPRESSION: Mild bibasilar atelectasis noted; lungs otherwise clear.   Electronically Signed   By: Garald Balding M.D.   On: 08/08/2014 06:20   Ct Head Wo Contrast  08/08/2014   CLINICAL DATA:  Postop right carotid endarterectomy, performed earlier today. When patient awoke from anesthesia, she was unable to move the left upper extremity.  EXAM: CT HEAD WITHOUT CONTRAST  TECHNIQUE: Contiguous axial images were obtained from the base of the skull through the vertex without intravenous contrast.  COMPARISON:  09/22/2013 and earlier.  FINDINGS: Ventricular system normal in size and appearance for age. Rule core vague low-attenuation involving the right parietal cortex approaching the vertex, not seen on prior examinations. No associated hemorrhage or hematoma. No extra-axial fluid collections. No associated mass effect or midline shift. Ventricular system normal in size and appearance for age.  No skull fracture or other focal osseous abnormality involving  the skull. Mucous retention cyst or polyp in the left maxillary sinus. Remaining visualized paranasal sinuses, bilateral mastoid air cells and bilateral middle ear cavities well aerated. Severe bilateral carotid siphon and vertebrobasilar atherosclerosis.  IMPRESSION: 1. Possible acute nonhemorrhagic stroke in the posterior right middle cerebral artery distribution involving the right parietal lobe near the vertex. MRI with diffusion imaging may be confirmatory. 2. No acute intracranial abnormality otherwise. Critical Value/emergent results were called by telephone at the time of interpretation on 08/08/2014 at 5:51 pm to the post anesthesia care unit, and I was told that the patient has been taken to the neurointerventional radiology suite for diagnosis and treatment.   Electronically Signed   By: Evangeline Dakin M.D.   On: 08/08/2014  17:56   Mr Brain Wo Contrast  08/09/2014   CLINICAL DATA:  Left arm weakness. Right carotid endarterectomy yesterday. Evidence of right MCA territory infarct on head CT yesterday following the procedure. Subsequent cerebral angiogram without evidence of large vessel occlusion.  EXAM: MRI HEAD WITHOUT CONTRAST  TECHNIQUE: Multiplanar, multiecho pulse sequences of the brain and surrounding structures were obtained without intravenous contrast.  COMPARISON:  Head CT 08/08/2014 and MRI 12/29/2007  FINDINGS: There is a moderate-sized acute cortical infarct involving the right parietal and posterior right frontal lobes corresponding to the low density seen on yesterday's CT. There are also smaller areas of patchy acute cortical infarction more anteriorly in the right frontal lobe as well as more inferiorly in the right parietal and lateral right occipital lobes. There may be trace associated petechial hemorrhage in the right parietal lobe.  There is no evidence of mass, midline shift, or extra-axial fluid collection. Ventricles and sulci are within normal limits for age. Minimal chronic small vessel ischemic disease is noted in the periventricular white matter bilaterally and in the brainstem. There is a chronic lacunar infarct in the right putamen.  Mild mucosal thickening is noted in the maxillary sinuses and ethmoid air cells bilaterally. Mastoid air cells are clear. Major intracranial vascular flow voids are preserved.  IMPRESSION: Acute right MCA territory infarcts, predominantly involving the parietal and posterior frontal lobes.   Electronically Signed   By: Logan Bores   On: 08/09/2014 14:12   Ct Abdomen Pelvis W Contrast  08/29/2014   CLINICAL DATA:  74 year old female with abdominal pain and nausea.  EXAM: CT ABDOMEN AND PELVIS WITH CONTRAST  TECHNIQUE: Multidetector CT imaging of the abdomen and pelvis was performed using the standard protocol following bolus administration of intravenous contrast.   CONTRAST:  117m OMNIPAQUE IOHEXOL 300 MG/ML  SOLN  COMPARISON:  09/16/2013  FINDINGS: Lower chest: No pleural effusion identified. No pericardial effusion.  Hepatobiliary: There is no suspicious liver abnormality. Prior cholecystectomy. No biliary dilatation.  Pancreas: Normal appearance of the pancreas.  Spleen: The spleen is normal.  Adrenals/Urinary Tract: The adrenal glands are both within normal limits. Normal appearance of both kidneys. The urinary bladder appears within normal limits.  Stomach/Bowel: The stomach appears normal. There is abnormal increased dilatation of the proximal small bowel loops which measure 5.1 cm, image 49/ series 5. There is fecalization of the small bowel contents. Transition to relative normal caliber small bowel loops are noted in the right abdomen, image 53/series 5. Some enteric contrast material can be seen distal to the relative transition point. There has been interval reversal of the patient's colostomy. The large bowel loops have a normal caliber without evidence for obstruction.  Vascular/Lymphatic: Calcified atherosclerotic disease involves the abdominal aorta. No  aneurysm. No enlarged retroperitoneal or mesenteric adenopathy. No enlarged pelvic or inguinal lymph nodes.  Reproductive: Previous hysterectomy.  No adnexal mass identified.  Other: No free fluid or fluid collections within the abdomen or pelvis. There is a ventral abdominal wall hernia which contains nonobstructed loops of bowel, image 44/series 2.  Musculoskeletal: There are no aggressive lytic or sclerotic bone lesions. Scoliosis and degenerative disc disease noted.  IMPRESSION: 1. Small bowel dilatation with relative transition point in the distal small bowel within the right side of abdomen. Findings are worrisome for partial small bowel obstruction perhaps due to chronic postoperative change/adhesions. 2. No bowel perforation. 3. Aortic atherosclerosis 4. Ventral abdominal wall hernia containing  nonobstructed loops of small bowel. 5. Prior cholecystectomy.   Electronically Signed   By: Kerby Moors M.D.   On: 08/29/2014 19:06   Dg Chest Port 1 View  08/08/2014   CLINICAL DATA:  Post right carotid endarterectomy.  EXAM: PORTABLE CHEST - 1 VIEW  COMPARISON:  08/08/2014  FINDINGS: Diffuse airspace disease scratch head diffuse interstitial and airspace opacities throughout the lungs, right greater than left. There is cardiomegaly. These findings are new since earlier and presumably reflect edema. No visible significant effusions. No acute bony abnormality.  IMPRESSION: New diffuse interstitial and alveolar opacities, right greater than left, lot most likely asymmetric edema.   Electronically Signed   By: Rolm Baptise M.D.   On: 08/08/2014 12:29   Dg Abd Portable 1v  08/30/2014   CLINICAL DATA:  Acute onset of abdominal pain. Assess for small bowel obstruction. Initial encounter.  EXAM: PORTABLE ABDOMEN - 1 VIEW  COMPARISON:  None.  FINDINGS: The visualized bowel gas pattern is grossly unremarkable. Contrast is seen within the cecum and ascending colon, suggesting successful passage of contrast since a recent prior study. No free intra-abdominal air is identified, though evaluation for free air is limited on a single supine view.  Contrast is seen filling the bladder. No acute osseous abnormalities are identified. Degenerative change is noted at the lower lumbar spine. Clips are noted within the right upper quadrant, reflecting prior cholecystectomy.  IMPRESSION: Unremarkable bowel gas pattern; no free intra-abdominal air seen. Contrast seen within the cecum and ascending colon, suggesting successful passage of contrast since a recent prior study. No evidence for bowel obstruction.   Electronically Signed   By: Garald Balding M.D.   On: 08/30/2014 01:38   Ir Angio Intra Extracran Sel Com Carotid Innominate Uni R Mod Sed  08/11/2014   CLINICAL DATA:  Left-sided weakness. Post right-sided carotid  endarterectomy.  EXAM: IR ANGIO INTRA EXTRACRAN SEL COM CAROTID INNOMINATE UNI RIGHT MOD SED, RIGHT COMMON CAROTID ARTERIOGRAM:  ANESTHESIA/SEDATION: Conscious sedation.  MEDICATIONS: Fentanyl 25 mcg IV.  CONTRAST:  25 mL OMNIPAQUE IOHEXOL 300 MG/ML  SOLN  PROCEDURE: Following a full explanation of the procedure along with the potential associated complications, an informed witnessed consent was obtained.  The right groin was prepped and draped in the usual sterile fashion. Thereafter using modified Seldinger technique, transfemoral access into the right common femoral artery was obtained without difficulty. Over a 0.035 inch guidewire a 5 French Pinnacle sheath was inserted. Through this, and also over a 0.035 inch guidewire a 5 French JB1 catheter was advanced to the aortic arch region and selectively positioned in the right common carotid artery. An arteriogram was then performed centered over the carotid bifurcation and intracranially.  COMPLICATIONS: None immediate.  FINDINGS: The right common carotid arteriogram demonstrates post endarterectomy changes extending from the  distal right common carotid artery into the right internal carotid artery proximally. The right external carotid artery and its major branches are normal.  Mild caliber irregularity is seen along the posterior aspect of the patch graft.  However no gross intraluminal filling defects are seen.  More distally the right internal carotid artery is seen to opacify to the cranial skull base.  There are focal areas of caliber irregularity with an associated stenoses of approximately 50% involving the cavernous segment of the right internal carotid artery. The right middle cerebral artery and the right anterior cerebral artery are seen to opacify normally into the capillary and the venous phases.  No evidence of stasis, intraluminal filling defects, or of occlusions are seen on the delayed arterial and capillary phases.  The delayed arterial phase  demonstrates a relative area of hypoperfusion involving the right anterior parietal cortical region. The venous outflow remains normal on the right common carotid artery injection.  IMPRESSION: Angiographically no evidence of gross filling defects, occlusions, or of stenosis seen extracranially or intracranially.  Hypoperfusion noted in the right parietal cortical region, the area of early infarction noted on the recent CT scan of the brain.   Electronically Signed   By: Luanne Bras M.D.   On: 08/10/2014 09:07   Labs: Basic Metabolic Panel:  Recent Labs Lab 08/29/14 1742 08/30/14 0708  NA 142 142  K 4.1 3.9  CL 111 110  CO2 20* 25  GLUCOSE 120* 93  BUN 13 10  CREATININE 0.66 0.70  CALCIUM 9.1 8.8*   Liver Function Tests:  Recent Labs Lab 08/29/14 1742 08/30/14 0708  AST 21 28  ALT 15 18  ALKPHOS 94 90  BILITOT 0.3 0.5  PROT 6.6 5.9*  ALBUMIN 3.6 3.0*    Recent Labs Lab 08/29/14 1742  LIPASE 15*   CBC:  Recent Labs Lab 08/29/14 1742 08/30/14 0708  WBC 12.9* 5.8  HGB 8.4* 9.5*  HCT 31.3* 30.0*  MCV 82.4 81.3  PLT 263 233   BNP (last 3 results)  Recent Labs  01/29/14 0433  BNP 219.9*    ProBNP (last 3 results)  Recent Labs  10/20/13 1550  PROBNP 379.5*    Signed:  Barton Dubois  [facility] 08/31/2014, 11:57 AM

## 2014-09-01 ENCOUNTER — Encounter: Payer: Self-pay | Admitting: Cardiology

## 2014-09-01 ENCOUNTER — Ambulatory Visit (INDEPENDENT_AMBULATORY_CARE_PROVIDER_SITE_OTHER): Payer: Commercial Managed Care - HMO | Admitting: Cardiology

## 2014-09-01 VITALS — BP 118/62 | HR 88 | Ht 60.0 in | Wt 148.0 lb

## 2014-09-01 DIAGNOSIS — I10 Essential (primary) hypertension: Secondary | ICD-10-CM

## 2014-09-01 DIAGNOSIS — E785 Hyperlipidemia, unspecified: Secondary | ICD-10-CM | POA: Diagnosis not present

## 2014-09-01 DIAGNOSIS — I5022 Chronic systolic (congestive) heart failure: Secondary | ICD-10-CM

## 2014-09-01 DIAGNOSIS — I739 Peripheral vascular disease, unspecified: Secondary | ICD-10-CM

## 2014-09-01 DIAGNOSIS — I2583 Coronary atherosclerosis due to lipid rich plaque: Secondary | ICD-10-CM

## 2014-09-01 DIAGNOSIS — Z9889 Other specified postprocedural states: Secondary | ICD-10-CM

## 2014-09-01 DIAGNOSIS — I2510 Atherosclerotic heart disease of native coronary artery without angina pectoris: Secondary | ICD-10-CM | POA: Diagnosis not present

## 2014-09-01 DIAGNOSIS — I639 Cerebral infarction, unspecified: Secondary | ICD-10-CM

## 2014-09-01 NOTE — Patient Instructions (Signed)
Medication Instructions:  Your physician recommends that you continue on your current medications as directed. Please refer to the Current Medication list given to you today.   Labwork: None   Testing/Procedures: None   Follow-Up: Your physician wants you to follow-up in: 6 months with Dr.Nelson You will receive a reminder letter in the mail two months in advance. If you don't receive a letter, please call our office to schedule the follow-up appointment.   Any Other Special Instructions Will Be Listed Below (If Applicable).   

## 2014-09-01 NOTE — Progress Notes (Signed)
Patient ID: [name], female   DOB: [birth year], 74 y.o.   MRN: [id number] Patient ID: [name], female   DOB: [birth year], 74 y.o.   MRN: [id number]    Patient Name: [name] Date of Encounter: 09/01/2014  Primary Care Provider:  Laverna Peace, NP Primary Cardiologist:  Dorothy Spark  Problem List   Past Medical History  Diagnosis Date  . Carotid artery disease     a. 60-70% bilat ICA stenosis by dopplers 05/2012.  Marland Kitchen Other and unspecified hyperlipidemia     takes Lipitor daily  . CAD (coronary artery disease)     a. Mod dz 2010 initially mgd medically. b. 12/2012 - angina s/p PTCA/DES to mid-circumflex, PTCA/DES to first OM.   Marland Kitchen Barrett's esophagus   . SBO (small bowel obstruction)     a. Lysis of adhesions and ovarian cystectomy 04/2013 for sBO. b. Admitted 07/2013 for colonic obstruction due to suspected colitis, s/p partial colectomy/colostomy 08/01/13. Admission complicated by anasarca, acute resp failure requiring tracheostomy, decannulated 08/25/13. c. Recurrent SBO8/2015, NGT placed for decompression.   . Colonic obstruction due to suspected colitis; s/p colectomy/colostomy     a. See SBO.  Marland Kitchen Protein-calorie malnutrition, severe w/ electrolyte imbalance     a. Severe hypoalbuminemia leading to 3rd spacing including anasarca and pulm edema 07/2013.  Marland Kitchen DVT of axillary vein, acute right     a. Dx 07/2013 felt due to R IJ central line that had been inserted 7/14. Not on anticoag due to GIB issues and small cerebral bleed.  . Acute respiratory failure with hypoxia s/p tracheostomy 07/2013  . Intracranial hemorrhage     a. 08/11/13 - per DC summary, tiny SAH vs SDH done for altered mentation, anticoag stopped including aspirin.  . Anemia     a. 7-08/2013 felt due to recent critical illness/chronic disease.  . Complication of anesthesia     hard to wake up from anesthesia   . Hypertension     takes Metoprolol daily  . HTN (hypertension)   . Heart murmur   .  COPD (chronic obstructive pulmonary disease)   . Pneumonia     hx of->15 yrs ago  . History of bronchitis     > 43yr ago   . Dizziness     was on Bentyl which caused this-took off of it and no problems since  . Stroke early 80's    right sided weakness--TIA  . Joint pain   . Chronic back pain     deteriorating;DDD  . Neck pain     DDD  . History of hiatal hernia   . History of colon polyps   . History of kidney stones     was told it was stable but doesn't know for sure that she ever passed it  . Depression     takes Remeron and Zoloft daily  . GERD (gastroesophageal reflux disease)   . Arthritis     handds, knees & back    Past Surgical History  Procedure Laterality Date  . Appendectomy    . Cholecystectomy    . Colon resection    . 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 Rotator cuff repair Left   . Abdominal hysterectomy    . Colon surgery    . Foot surgery Left   . Hand surgery Bilateral     for ganglion cysts  .  Laparotomy N/A 08/01/2013    Procedure: EXPLORATORY LAPAROTOMY;  Surgeon: Harl Bowie, MD;  Location: [place];  Service: General;  Laterality: N/A;  . Partial colectomy N/A 08/01/2013    Procedure: PARTIAL COLECTOMY;  Surgeon: Harl Bowie, MD;  Location: [place];  Service: General;  Laterality: N/A;  . Colostomy N/A 08/01/2013    Procedure: COLOSTOMY;  Surgeon: Harl Bowie, MD;  Location: [place];  Service: General;  Laterality: N/A;  . Bowel resection N/A 08/01/2013    Procedure: SMALL BOWEL RESECTION;  Surgeon: Harl Bowie, MD;  Location: [place];  Service: General;  Laterality: N/A;  . Tracheostomy      feinstein  . Left heart catheterization with coronary angiogram N/A 12/20/2012    Procedure: LEFT HEART CATHETERIZATION WITH CORONARY ANGIOGRAM;  Surgeon: Blane Ohara, MD;  Location: [facility] CATH LAB;  Service: Cardiovascular;  Laterality: N/A;  . Cataract surgery Bilateral   . Colostomy  takedown  01/24/2014    dr Ninfa Linden  . Colostomy takedown N/A 01/24/2014    Procedure: COLOSTOMY TAKEDOWN;  Surgeon: Coralie Keens, MD;  Location: [place];  Service: General;  Laterality: N/A;  . Tonsillectomy    . Endarterectomy Right 08/08/2014    Procedure: RIGHT CAROTID ENDARTERECTOMY WITH HEMASHIELD PATCH ANGIOPLASTY;  Surgeon: Elam Dutch, MD;  Location: [place];  Service: Vascular;  Laterality: Right;    Allergies  Allergies  Allergen Reactions  . Pregabalin Swelling    Tongue swelling  . Sulfonamide Derivatives Swelling    Childhood reaction   HPI  [name] was previously followed by Dr Verl Blalock. She is a pleasant 74 year old female with h/o hyperlipidemia, hypertension, PAD and CAD who returns today for a 1 year follow up. The patient is a lifelong smoker.  She had worsening exertional CP and DOE in November 2014, underwent a cardiac cath on 12/20/2012 with finding of severe stenosis in OM1 and received 2 DES. She was doing well from cardiac standpoint. In May 2015 had a SBO and required partial colon resection and now has a colostomy bag, lost 30 lbs.   She has been experiencing LE edema and SOB since then, got worse 3 weeks ago. She was seen in the ER and scheduled for an outpatient Lexiscan stress that was normal. She was prescribed 20 mg PO Lasix that helped mildly. LE edema worse toward the end of day. No orthopnea, PND. BNP 300, CXR no congestion. She is complaining of significant bruising.  01/09/2014 - she reports that her LE edema has resolved with increased lasix dose. She was taken off lasix , her potassium was low and she was started on 20 mEq of KCL, she she was taking every other day. She is awaiting colostomy reconstruction surgery on January 24 2014. No chest pain or SOB, just feels tired all the time.   06/02/2014 - the patient is coming after 6 months, in the meantime she underwent elective colostomy with no complications, she has been experiencing left shoulder  pain and was seen by Dr Eden Lathe, who recommended an orthopedic surgeon. She has obtained a steroid injection yesterday. Denies chest pain or DOE. No LE edema, orthopnea, palpitations or syncope.  09/01/14 - the patient had a rough month, she underwent right carotid endarterectomy on 97/98/9211 that was complicated by postop stroke in the recovery room. This resulted in a left hemiplegia. She still has some mild left facial droop and weakness in her left arm, she is able to walk. She was  also admitted on 08/29/14 with a small bowel obstruction that resolved and no surgery was needed. She denies CP, SOB, orthopnea or PND, no LE edema.    Home Medications  Prior to Admission medications   Medication Sig Start Date End Date Taking? Authorizing Provider  aspirin 81 MG tablet Take 81 mg by mouth 2 (two) times daily.    Yes Historical Provider, MD  atorvastatin (LIPITOR) 80 MG tablet Take 80 mg by mouth daily.   Yes Historical Provider, MD  dicyclomine (BENTYL) 10 MG capsule as directed. 08/20/10  Yes Historical Provider, MD  diphenhydramine-acetaminophen (TYLENOL PM) 25-500 MG TABS Take 1 tablet by mouth at bedtime as needed.     Yes Historical Provider, MD  fish oil-[name]-3 fatty acids 1000 MG capsule Take 1 g by mouth daily.     Yes Historical Provider, MD  folic acid (FOLVITE) 269 MCG tablet Take 400 mcg by mouth daily.     Yes Historical Provider, MD  metoprolol succinate (TOPROL-XL) 25 MG 24 hr tablet TAKE 1 TABLET ONCE DAILY. 05/04/12  Yes Renella Cunas, MD  nitroGLYCERIN (NITROSTAT) 0.4 MG SL tablet Place 0.4 mg under the tongue every 5 (five) minutes as needed.     Yes Historical Provider, MD  omeprazole (PRILOSEC) 20 MG capsule Take 20 mg by mouth 2 (two) times daily.     Yes Historical Provider, MD  sertraline (ZOLOFT) 100 MG tablet Take 150 mg by mouth daily.    Yes Historical Provider, MD    Family History  Family History  Problem Relation Age of Onset  . Heart disease    . Varicose Veins  Mother   . Heart disease Father   . Heart attack Father   . AAA (abdominal aortic aneurysm) Father     Social History  Social History   Social History  . Marital Status: Married    Spouse Name: N/A  . Number of Children: N/A  . Years of Education: N/A   Occupational History  . full time    Social History Main Topics  . Smoking status: Current Some Day Smoker -- 0.50 packs/day for 53 years    Types: Cigarettes  . Smokeless tobacco: Never Used     Comment: also uses e cig, 4 A DAY  . Alcohol Use: No  . Drug Use: No  . Sexual Activity: Not Currently    Birth Control/ Protection: Surgical   Other Topics Concern  . Not on file   Social History Narrative     Review of Systems, as per HPI, otherwise negative General:  No chills, fever, night sweats or weight changes.  Cardiovascular:  No chest pain, dyspnea on exertion, edema, orthopnea, palpitations, paroxysmal nocturnal dyspnea. Dermatological: No rash, lesions/masses Respiratory: No cough, dyspnea Urologic: No hematuria, dysuria Abdominal:   No nausea, vomiting, diarrhea, bright red blood per rectum, melena, or hematemesis Neurologic:  No visual changes, wkns, changes in mental status. All other systems reviewed and are otherwise negative except as noted above.  Physical Exam  Blood pressure 118/62, pulse 88, height 5' (1.524 m), weight 148 lb (67.132 kg), SpO2 98 %.  General: Pleasant, NAD Psych: Normal affect. Neuro: Alert and oriented X 3. Moves all extremities spontaneously. HEENT: Normal  Neck: Supple without bruits or JVD. Well healed scar post right carotid endarterectomy. Lungs:  Resp regular and unlabored, CTA. Heart: RRR no s3, s4, or murmurs. Abdomen: Soft, non-tender, non-distended, BS + x 4.  Extremities: No clubbing, cyanosis or edema. DP/PT/Radials 2+  and equal bilaterally.  Labs:  No results for input(s): CKTOTAL, CKMB, TROPONINI in the last 72 hours. Lab Results  Component Value Date   WBC  5.8 08/30/2014   HGB 9.5* 08/30/2014   HCT 30.0* 08/30/2014   MCV 81.3 08/30/2014   PLT 233 08/30/2014     Recent Labs Lab 08/30/14 0708  NA 142  K 3.9  CL 110  CO2 25  BUN 10  CREATININE 0.70  CALCIUM 8.8*  PROT 5.9*  BILITOT 0.5  ALKPHOS 90  ALT 18  AST 28  GLUCOSE 93   Lab Results  Component Value Date   CHOL 95 08/09/2014   HDL 36* 08/09/2014   LDLCALC 40 08/09/2014   TRIG 97 08/09/2014   Lab Results  Component Value Date   DDIMER 2.78* 10/20/2013   Invalid input(s): POCBNP  Accessory Clinical Findings  Echocardiogram - 08/03/2013 Study Conclusions  - Left ventricle: The cavity size was normal. Systolic function was normal. Wall motion was normal; there were no regional wall motion abnormalities. - Left atrium: The atrium was severely dilated.  ECG - SR, normal ECG  Cardiac cath 12/20/2012   Lexiscan nuclear stress test: 11/04/2013 Quantitative Gated Spect Images  QGS EDV: 70 ml  QGS ESV: 22 ml  Impression  Exercise Capacity: Lexiscan with no exercise.  BP Response: Normal blood pressure response.  Clinical Symptoms: Chest tightness  ECG Impression: No significant ST segment change suggestive of ischemia.  Comparison with Prior Nuclear Study: Study is compared with the report of the study from October, 2009  Overall Impression: Normal stress nuclear study. No scar or ischemia. Low risk scan. No change when compared to the report of October, 2009  LV Ejection Fraction: 69%. LV Wall Motion: Normal Wall Motion.  03/2014  Carotid US Heterogeneous plaque, bilaterally. Essentially stable 60-79% bilateral ICA stenosis.(LICA high end of range) Elevated left subclavian artery velocities. Patent vertebral arteries with antegrade flow  08/09/2014 Left ventricle: The cavity size was normal. There was mild concentric hypertrophy. Systolic function was vigorous. The estimated ejection fraction was in the range of 65% to 70%. Wall motion was normal;  there were no regional wall motion abnormalities. Doppler parameters are consistent with elevated ventricular end-diastolic filling pressure. - Aortic valve: Trileaflet; normal thickness leaflets. There was no regurgitation. - Aortic root: The aortic root was normal in size. - Left atrium: The atrium was mildly dilated. - Right ventricle: Systolic function was normal. - Right atrium: The atrium was normal in size. - Tricuspid valve: There was trivial regurgitation. - Pulmonic valve: There was no regurgitation. - Pulmonary arteries: Systolic pressure was within the normal range. - Inferior vena cava: The vessel was normal in size. - Pericardium, extracardiac: There was no pericardial effusion.   Assessment & Plan   74 year old female with multiple risk factors for CAD including lifelong smoking, known B/L carotid disease, HTN and hyperlipidemia  1. CAD- stable - Severe left circumflex and OM disease treated successfully with PCI x 2 on 12/20/2012. DAT d/c-ed in 12/15. Negative stress test on 11/07/2013.  Continue ASA and atorvastatin, metoprolol.  2. Chronic diastolic CHF - continue holding lasix, TTE on 08/09/14 shows elevated LVEDP, however appears euvolemic.   3. Hypertension - controlled   4. Hyperlipidemia - managed by PCP, on high dose of atorvastatin 80 mg PO QHS, normal LFTs.  5. PAD - s/p right carotid endarterectomy - complicated by stroke. Followed by Dr Oneida Alar.  Follow up in 6 months.  Macaila Tahir, Jamse Belfast,  MD, [facility] 09/01/2014, 2:34 PM

## 2014-09-22 ENCOUNTER — Inpatient Hospital Stay: Payer: Commercial Managed Care - HMO | Admitting: Physical Medicine & Rehabilitation

## 2014-09-29 ENCOUNTER — Inpatient Hospital Stay: Payer: Commercial Managed Care - HMO | Admitting: Physical Medicine & Rehabilitation

## 2014-09-29 ENCOUNTER — Ambulatory Visit: Payer: Commercial Managed Care - HMO

## 2014-10-02 ENCOUNTER — Ambulatory Visit (HOSPITAL_BASED_OUTPATIENT_CLINIC_OR_DEPARTMENT_OTHER): Payer: Commercial Managed Care - HMO | Admitting: Physical Medicine & Rehabilitation

## 2014-10-02 ENCOUNTER — Encounter: Payer: Self-pay | Admitting: Physical Medicine & Rehabilitation

## 2014-10-02 ENCOUNTER — Encounter: Payer: Commercial Managed Care - HMO | Attending: Physical Medicine & Rehabilitation

## 2014-10-02 VITALS — BP 132/54 | HR 84 | Resp 14

## 2014-10-02 DIAGNOSIS — M7502 Adhesive capsulitis of left shoulder: Secondary | ICD-10-CM

## 2014-10-02 DIAGNOSIS — M7542 Impingement syndrome of left shoulder: Secondary | ICD-10-CM

## 2014-10-02 DIAGNOSIS — I10 Essential (primary) hypertension: Secondary | ICD-10-CM | POA: Diagnosis not present

## 2014-10-02 DIAGNOSIS — G8190 Hemiplegia, unspecified affecting unspecified side: Secondary | ICD-10-CM | POA: Insufficient documentation

## 2014-10-02 DIAGNOSIS — G8194 Hemiplegia, unspecified affecting left nondominant side: Secondary | ICD-10-CM

## 2014-10-02 DIAGNOSIS — F17210 Nicotine dependence, cigarettes, uncomplicated: Secondary | ICD-10-CM | POA: Insufficient documentation

## 2014-10-02 DIAGNOSIS — K219 Gastro-esophageal reflux disease without esophagitis: Secondary | ICD-10-CM | POA: Diagnosis not present

## 2014-10-02 MED ORDER — GABAPENTIN 100 MG PO CAPS: 100.0000 mg | ORAL_CAPSULE | Freq: Three times a day (TID) | ORAL | Status: DC

## 2014-10-02 NOTE — Progress Notes (Signed)
Subjective:    Patient ID: [name], female    DOB: 07-25-1940, 74 y.o.   MRN: [id number] MRI brain done 08/09/2014 revealing acute right parietal and posterior frontal lobe infarcts.  2D echo revealed EF 65-70% and no wall abnormality. Neurology felt that patient with thromboembolic stroke post op and to continue ASA 162 mg daily.   HPI Patient returns today she is a 74 year old female He suffered left hemiparesis due to stroke came over to the inpatient rehabilitation on 08/12/2014 and stayed for approximately 1 week after which she was discharged to home with her husband. She's had good improvements in her mobility and lower 70 strength but she's had continued problems with left upper extremity weakness. She's been receiving home health therapies but is now ready to start outpatient.  She still requires assistance for cooking and housework. She has started to wash dishes.  Her main complaint today is left upper extremity pain. She has pain in the left side of the neck the left shoulder tingling pains in the arm and swelling in the left hand. She has had no trauma to that area. She does not have any history of neck surgery in the past. Her pain at times becomes severe. It is sometimes helped by rest but other times it seems like her pain comes on while she is trying to rest. She has an aching pain in her shoulder or neck she has more of a tingling pain in her left arm and forearm. She has noted swelling in the hand.  Pain Inventory Average Pain 10 Pain Right Now 10 My pain is aching  In the last 24 hours, has pain interfered with the following? General activity 7 Relation with others 0 Enjoyment of life 0 What TIME of day is your pain at its worst? morning, evening  Sleep (in general) varies with pain level  Pain is worse with: unsure and some activites Pain improves with: therapy/exercise and medication Relief from Meds: 3  Mobility walk without assistance walk with  assistance use a cane how many minutes can you walk? 30 ability to climb steps?  yes do you drive?  no Do you have any goals in this area?  yes  Function retired I need assistance with the following:  dressing, bathing, meal prep, household duties and shopping Do you have any goals in this area?  yes  Neuro/Psych numbness tremor dizziness depression  Prior Studies hospital f/u  Physicians involved in your care hospital f/u   Family History  Problem Relation Age of Onset  . Heart disease    . Varicose Veins Mother   . Heart disease Father   . Heart attack Father   . AAA (abdominal aortic aneurysm) Father    Social History   Social History  . Marital Status: Married    Spouse Name: N/A  . Number of Children: N/A  . Years of Education: N/A   Occupational History  . full time    Social History Main Topics  . Smoking status: Current Some Day Smoker -- 0.50 packs/day for 53 years    Types: Cigarettes  . Smokeless tobacco: Never Used     Comment: also uses e cig, 4 A DAY  . Alcohol Use: No  . Drug Use: No  . Sexual Activity: Not Currently    Birth Control/ Protection: Surgical   Other Topics Concern  . None   Social History Narrative   Past Surgical History  Procedure Laterality Date  . Appendectomy    .  Cholecystectomy    . Colon resection    . 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 Rotator cuff repair Left   . Abdominal hysterectomy    . Colon surgery    . Foot surgery Left   . Hand surgery Bilateral     for ganglion cysts  . Laparotomy N/A 08/01/2013    Procedure: EXPLORATORY LAPAROTOMY;  Surgeon: Harl Bowie, MD;  Location: [place];  Service: General;  Laterality: N/A;  . Partial colectomy N/A 08/01/2013    Procedure: PARTIAL COLECTOMY;  Surgeon: Harl Bowie, MD;  Location: [place];  Service: General;  Laterality: N/A;  . Colostomy N/A 08/01/2013    Procedure: COLOSTOMY;  Surgeon:  Harl Bowie, MD;  Location: [place];  Service: General;  Laterality: N/A;  . Bowel resection N/A 08/01/2013    Procedure: SMALL BOWEL RESECTION;  Surgeon: Harl Bowie, MD;  Location: [place];  Service: General;  Laterality: N/A;  . Tracheostomy      feinstein  . Left heart catheterization with coronary angiogram N/A 12/20/2012    Procedure: LEFT HEART CATHETERIZATION WITH CORONARY ANGIOGRAM;  Surgeon: Blane Ohara, MD;  Location: [facility] CATH LAB;  Service: Cardiovascular;  Laterality: N/A;  . Cataract surgery Bilateral   . Colostomy takedown  01/24/2014    dr Ninfa Linden  . Colostomy takedown N/A 01/24/2014    Procedure: COLOSTOMY TAKEDOWN;  Surgeon: Coralie Keens, MD;  Location: [place];  Service: General;  Laterality: N/A;  . Tonsillectomy    . Endarterectomy Right 08/08/2014    Procedure: RIGHT CAROTID ENDARTERECTOMY WITH HEMASHIELD PATCH ANGIOPLASTY;  Surgeon: Elam Dutch, MD;  Location: [place];  Service: Vascular;  Laterality: Right;   Past Medical History  Diagnosis Date  . Carotid artery disease     a. 60-70% bilat ICA stenosis by dopplers 05/2012.  Marland Kitchen Other and unspecified hyperlipidemia     takes Lipitor daily  . CAD (coronary artery disease)     a. Mod dz 2010 initially mgd medically. b. 12/2012 - angina s/p PTCA/DES to mid-circumflex, PTCA/DES to first OM.   Marland Kitchen Barrett's esophagus   . SBO (small bowel obstruction)     a. Lysis of adhesions and ovarian cystectomy 04/2013 for sBO. b. Admitted 07/2013 for colonic obstruction due to suspected colitis, s/p partial colectomy/colostomy 08/01/13. Admission complicated by anasarca, acute resp failure requiring tracheostomy, decannulated 08/25/13. c. Recurrent SBO8/2015, NGT placed for decompression.   . Colonic obstruction due to suspected colitis; s/p colectomy/colostomy     a. See SBO.  Marland Kitchen Protein-calorie malnutrition, severe w/ electrolyte imbalance     a. Severe hypoalbuminemia leading to 3rd spacing including anasarca and pulm  edema 07/2013.  Marland Kitchen DVT of axillary vein, acute right     a. Dx 07/2013 felt due to R IJ central line that had been inserted 7/14. Not on anticoag due to GIB issues and small cerebral bleed.  . Acute respiratory failure with hypoxia s/p tracheostomy 07/2013  . Intracranial hemorrhage     a. 08/11/13 - per DC summary, tiny SAH vs SDH done for altered mentation, anticoag stopped including aspirin.  . Anemia     a. 7-08/2013 felt due to recent critical illness/chronic disease.  . Complication of anesthesia     hard to wake up from anesthesia   . Hypertension     takes Metoprolol daily  . HTN (hypertension)   .  Heart murmur   . COPD (chronic obstructive pulmonary disease)   . Pneumonia     hx of->15 yrs ago  . History of bronchitis     > 35yr ago   . Dizziness     was on Bentyl which caused this-took off of it and no problems since  . Stroke early 80's    right sided weakness--TIA  . Joint pain   . Chronic back pain     deteriorating;DDD  . Neck pain     DDD  . History of hiatal hernia   . History of colon polyps   . History of kidney stones     was told it was stable but doesn't know for sure that she ever passed it  . Depression     takes Remeron and Zoloft daily  . GERD (gastroesophageal reflux disease)   . Arthritis     handds, knees & back    BP 132/54 mmHg  Pulse 84  Resp 14  SpO2 98%  Opioid Risk Score:   Fall Risk Score:  `1  Depression screen PHQ 2/9  No flowsheet data found.   Review of Systems  Musculoskeletal: Positive for gait problem.  Neurological: Positive for dizziness, tremors and numbness.  Psychiatric/Behavioral: Positive for dysphoric mood.  All other systems reviewed and are negative.      Objective:   Physical Exam  Constitutional: She is oriented to person, place, and time. She appears well-developed and well-nourished.  HENT:  Head: Normocephalic and atraumatic.  Eyes: Conjunctivae are normal. Pupils are equal, round, and reactive to  light.  Neck:  Neck ration motion reduced there is tightness and some tenderness in the left upper trapezius.  Musculoskeletal:       Left shoulder: She exhibits decreased range of motion, tenderness, deformity, pain, spasm and decreased strength. She exhibits no swelling.  Neurological: She is alert and oriented to person, place, and time.  Psychiatric: She has a normal mood and affect.  Nursing note and vitals reviewed.  Motor strength in left upper extremity is 3 minus in the deltoid, bicep 4 minus tricep and grip. Right upper showing 5/5 in all muscle groups        Assessment & Plan:  Medical Problem List and Plan: 1. Functional deficits secondary to Right MCA infarct after CEA with left hemiparesis and sensory loss 2.  Left upper extremity pain I believe this is multifactorial. She does have signs of impingement syndrome as well as adhesive capsulitis. In addition she appears to have neuropathic pain in the entire left upper extremity. I do not think she has evidence of reflex sympathetic dystrophy no significant vasomotor changes but does have some mild edema of the left hand. Recommend gabapentin 100 mg 3 times a day for neuropathic pain May in addition need left shoulder injection, Will continue therapy on an outpatient side with PT and OT and decide on injection in 1 month at next visit  3. Left-sided neck pain and believe this is mainly in the trapezius she is overusing that muscle when she is trying to elevate her shoulder. Recommend Aspercreme plus heating pad 30 minutes 3 times per day  We discussed her various pain complaints today and treatments for all these we discussed the management of edema in her left upper extremity. Over half of the 25 min visit was spent counseling and coordinating care.

## 2014-10-02 NOTE — Patient Instructions (Signed)
Please use Aspercreme plus heating pad to the left shoulder area 2 or 3 times per day for about 30 minutes

## 2014-10-23 ENCOUNTER — Encounter: Payer: Self-pay | Admitting: Neurology

## 2014-10-23 ENCOUNTER — Ambulatory Visit (INDEPENDENT_AMBULATORY_CARE_PROVIDER_SITE_OTHER): Payer: Commercial Managed Care - HMO | Admitting: Neurology

## 2014-10-23 VITALS — BP 109/68 | HR 77 | Ht 60.0 in | Wt 156.8 lb

## 2014-10-23 DIAGNOSIS — I6521 Occlusion and stenosis of right carotid artery: Secondary | ICD-10-CM | POA: Diagnosis not present

## 2014-10-23 DIAGNOSIS — I10 Essential (primary) hypertension: Secondary | ICD-10-CM | POA: Diagnosis not present

## 2014-10-23 DIAGNOSIS — Z9889 Other specified postprocedural states: Secondary | ICD-10-CM | POA: Insufficient documentation

## 2014-10-23 DIAGNOSIS — F17200 Nicotine dependence, unspecified, uncomplicated: Secondary | ICD-10-CM | POA: Diagnosis not present

## 2014-10-23 DIAGNOSIS — I6529 Occlusion and stenosis of unspecified carotid artery: Secondary | ICD-10-CM | POA: Insufficient documentation

## 2014-10-23 DIAGNOSIS — I63411 Cerebral infarction due to embolism of right middle cerebral artery: Secondary | ICD-10-CM | POA: Insufficient documentation

## 2014-10-23 NOTE — Patient Instructions (Signed)
-   continue ASA and lipitor for stroke prevention - follow up with Dr. Read Drivers for left shoulder pain after stroke - follow up with Dr. Oneida Alar as scheduled. - Follow up with your primary care physician for stroke risk factor modification. Recommend maintain blood pressure goal around 130/80, diabetes with hemoglobin A1c goal below 6.5% and lipids with LDL cholesterol goal below 70 mg/dL.  - check BP at home - continue PT/OT and exercise at home - follow up in 4 months

## 2014-10-23 NOTE — Progress Notes (Signed)
STROKE NEUROLOGY FOLLOW UP NOTE  NAME: [name] DOB: 1940-10-30  REASON FOR VISIT: stroke follow up HISTORY FROM: pt and chart  Today we had the pleasure of seeing [name] in follow-up at our Neurology Clinic. Pt was accompanied by husband.   History Summary [name] is a 74 yo F with hx of CAD, HTN, current smoker, and carotid stenosis admitted on7/19/16 for right CEA. However, [name] developed left-sided weakness post procedure. Angio and CUS showed right ICA patent, likely the clot has moved distally and caused infarction. MRI showed right MCA territory infarcts. 2D negative. LDL 40 and A1c 6.2. Continued aspirin 162 and put on statin . Pt was going to quit smoking. [name] was discharged to 7  Interval History During the interval time, the patient has been doing well. No recurrent stroke symptoms, left arm and leg muscle strengths much improved. However, still has left-sided tingling and numbness, and developed left shoulder pain. [name] had followed with Dr. Oneida Alar and will repeat carotid Doppler in 02/2015. [name] is also going to see Dr. Elnita Maxwell in 10 days. BP 109/58 in clinic today.  [name] was admitted in 08/2014 for partial SBO, neurosurgery, and went conservative management. Symptoms gradually resolved.  REVIEW OF SYSTEMS: Full 14 system review of systems performed and notable only for those listed below and in HPI above, all others are negative:  Constitutional:   Cardiovascular:  Ear/Nose/Throat:   Skin:  Eyes:   Respiratory:   Gastroitestinal:   Genitourinary:  Hematology/Lymphatic:   Endocrine:  Musculoskeletal:   Allergy/Immunology:   Neurological:  Memory loss, confusion, numbness, weakness Psychiatric: Depression Sleep:   The following represents the patient's updated allergies and side effects list: Allergies  Allergen Reactions  . Pregabalin Swelling    Tongue swelling  . Sulfonamide Derivatives Swelling    Childhood reaction     The neurologically relevant items on the patient's problem list were reviewed on today's visit.  Neurologic Examination  A problem focused neurological exam (12 or more points of the single system neurologic examination, vital signs counts as 1 point, cranial nerves count for 8 points) was performed.  Blood pressure 109/68, pulse 77, height 5' (1.524 m), weight 156 lb 12.8 oz (71.124 kg).  General - Well nourished, well developed, in no apparent distress.  Ophthalmologic - Sharp disc margins OU.  Cardiovascular - Regular rate and rhythm with no murmur.  Mental Status -  Level of arousal and orientation to time, place, and person were intact. Language including expression, naming, repetition, comprehension was assessed and found intact. Fund of Knowledge was assessed and was intact.  Cranial Nerves II - XII - II - Visual field intact OU. III, IV, VI - Extraocular movements intact. V - Facial sensation decreased on the left, 50% of right. VII - Facial movement intact bilaterally. VIII - Hearing & vestibular intact bilaterally. X - Palate elevates symmetrically. XI - Chin turning & shoulder shrug intact bilaterally. XII - Tongue protrusion intact.  Motor Strength - The patient's strength was 3-/5 LUE proximal, 3/5 bicep and tricep, 4/5 hand grip, LLE proximal 4+/5, distal 5/5. Bulk was normal and fasciculations were absent.   Motor Tone - Muscle tone was assessed at the neck and appendages and was normal.  Reflexes - The patient's reflexes were 1+ in all extremities and [name] had no pathological reflexes.  Sensory - Light touch, temperature/pinprick were assessed and were decreased on the left, 50% of right.  Coordination - The patient had  normal movements in the hands and feet with no ataxia or dysmetria.  Tremor was absent.  Gait and Station - mild left hemiparesis gait.  Data reviewed: I personally reviewed the images and agree with the radiology interpretations.  Ct  Head Wo Contrast 08/08/2014 1. Possible acute nonhemorrhagic stroke in the posterior right middle cerebral artery distribution involving the right parietal lobe near the vertex. MRI with diffusion imaging may be confirmatory. 2. No acute intracranial abnormality otherwise.   MRI 08/09/2014 Acute right MCA territory infarcts, predominantly involving the parietal and posterior frontal lobes.  CUS 08/08/14 The right internal carotid artery is patent. There is no flap or significant plaque morphology seen within the visualized portion of the right internal carotid artery. There is evidence of elevated distal right internal carotid artery velocities measuring 204/64 at its highest. The right vertebral artery is patent with antegrade flow.  Cerebral angio  1.Wide patency of Rt end endarterectomy site . 2. No gross filling defects or occlusions noted  2D ehco  - Left ventricle: The cavity size was normal. There was mildconcentric hypertrophy. Systolic function was vigorous. Theestimated ejection fraction was in the range of 65% to 70%. Wallmotion was normal; there were no regional wall motionabnormalities. Doppler parameters are consistent with elevatedventricular end-diastolic filling pressure. - Aortic valve: Trileaflet; normal thickness leaflets. There was noregurgitation. - Aortic root: The aortic root was normal in size. - Left atrium: The atrium was mildly dilated. - Right ventricle: Systolic function was normal. - Right atrium: The atrium was normal in size. - Tricuspid valve: There was trivial regurgitation. - Pulmonic valve: There was no regurgitation. - Pulmonary arteries: Systolic pressure was within the normalrange. - Inferior vena cava: The vessel was normal in size. - Pericardium, extracardiac: There was no pericardial effusion.  Component     Latest Ref Rng 08/09/2014 08/30/2014  Cholesterol     0 - 200 mg/dL 95   Triglycerides     <150 mg/dL 97   HDL Cholesterol      >40 mg/dL 36 (L)   Total CHOL/HDL Ratio      2.6   VLDL     0 - 40 mg/dL 19   LDL (calc)     0 - 99 mg/dL 40   Hemoglobin A1C     4.8 - 5.6 % 6.2 (H) 6.2 (H)  Mean Plasma Glucose      131 131  TSH     0.350 - 4.500 uIU/mL 0.690 2.724  Free T4     0.61 - 1.12 ng/dL 1.00     Assessment: As you may recall, [name] is a 74 y.o. Caucasian female with PMH of CAD, HTN, ongoing smoker, and carotid stenosis admitted on7/19/16 for right CEA. However, [name] developed left-sided weakness post procedure. Angio and CUS showed right ICA patent, likely related to procedure and the clot has moved distally and caused infarction. MRI showed right MCA territory infarcts. 2D negative. LDL 40 and A1c 6.2. Continued aspirin 162 and put on statin. Pt has quit smoking. Has outpatient PT/OT. Continued to have left side mild hemiparesis and hemi-paresthesia as well as left shoulder pain. Follow up with Dr. Oneida Alar and Dr. Read Drivers.  Plan:  - continue ASA and lipitor for stroke prevention - follow up with Dr. Read Drivers for left shoulder pain after stroke - follow up with Dr. Oneida Alar as scheduled and monitor carotid stenosis. - Follow up with your primary care physician for stroke risk factor modification. Recommend maintain blood pressure goal around 130/80,  diabetes with hemoglobin A1c goal below 6.5% and lipids with LDL cholesterol goal below 70 mg/dL.  - check BP at home - continue PT/OT and exercise at home - RTC in 4 months  No orders of the defined types were placed in this encounter.    No orders of the defined types were placed in this encounter.    Patient Instructions  - continue ASA and lipitor for stroke prevention - follow up with Dr. Read Drivers for left shoulder pain after stroke - follow up with Dr. Oneida Alar as scheduled. - Follow up with your primary care physician for stroke risk factor modification. Recommend maintain blood pressure goal around 130/80, diabetes with hemoglobin A1c goal below 6.5% and  lipids with LDL cholesterol goal below 70 mg/dL.  - check BP at home - continue PT/OT and exercise at home - follow up in 4 months   Rosalin Hawking, MD PhD [facility] [address], [place], [place] [address] [phone]

## 2014-11-02 ENCOUNTER — Encounter: Payer: Self-pay | Admitting: Physical Medicine & Rehabilitation

## 2014-11-02 ENCOUNTER — Other Ambulatory Visit: Payer: Self-pay | Admitting: Cardiology

## 2014-11-02 ENCOUNTER — Ambulatory Visit (HOSPITAL_BASED_OUTPATIENT_CLINIC_OR_DEPARTMENT_OTHER): Payer: Commercial Managed Care - HMO | Admitting: Physical Medicine & Rehabilitation

## 2014-11-02 ENCOUNTER — Encounter: Payer: Commercial Managed Care - HMO | Attending: Physical Medicine & Rehabilitation

## 2014-11-02 VITALS — BP 140/70 | HR 94

## 2014-11-02 DIAGNOSIS — M7542 Impingement syndrome of left shoulder: Secondary | ICD-10-CM

## 2014-11-02 DIAGNOSIS — F17210 Nicotine dependence, cigarettes, uncomplicated: Secondary | ICD-10-CM | POA: Diagnosis not present

## 2014-11-02 DIAGNOSIS — G8194 Hemiplegia, unspecified affecting left nondominant side: Secondary | ICD-10-CM

## 2014-11-02 DIAGNOSIS — M1990 Unspecified osteoarthritis, unspecified site: Secondary | ICD-10-CM

## 2014-11-02 DIAGNOSIS — M7502 Adhesive capsulitis of left shoulder: Secondary | ICD-10-CM

## 2014-11-02 DIAGNOSIS — G8190 Hemiplegia, unspecified affecting unspecified side: Secondary | ICD-10-CM | POA: Diagnosis not present

## 2014-11-02 DIAGNOSIS — I10 Essential (primary) hypertension: Secondary | ICD-10-CM | POA: Insufficient documentation

## 2014-11-02 DIAGNOSIS — K219 Gastro-esophageal reflux disease without esophagitis: Secondary | ICD-10-CM | POA: Diagnosis not present

## 2014-11-02 MED ORDER — GABAPENTIN 300 MG PO CAPS: 300.0000 mg | ORAL_CAPSULE | Freq: Three times a day (TID) | ORAL | Status: DC

## 2014-11-02 NOTE — Patient Instructions (Signed)
You may use ice for 20 minutes every 2 hours if you have soreness in the shoulder or if you have bruising. The medicine may take up to 48 hours to take effect

## 2014-11-02 NOTE — Progress Notes (Signed)
   Subjective:    Patient ID: [name], female    DOB: [birth year], 74 y.o.   MRN: [id number]  HPI 74 year old female with right CVA causing left hemiparesis. She has been receiving PT and OT and will be finishing those this week. She feels like she has made good progress with her left upper extremity strength but has been complaining of increasing left shoulder pain. She is now able to grasp and release objects with her left hand.  Past medical history patient states that she has shoulder surgery on the left side Review of Systems Still has some numbness and tingling in the left upper extremity     Objective:   Physical Exam  Patient has decreased sensation on the left side to light touch in the shoulder hand wrist and elbow area. There is no swelling in the left hand. She has 3 minus at the left deltoid 4 minus bicep tricep 3 minus finger flexors and extensors.  There is positive impingement sign left shoulder. Expression of the shoulder shows no evidence of joint swelling no evidence of subluxation. No scarring.  Patient with decreased external rotation left shoulder. Pain at end range     Assessment & Plan:  1. Post stroke shoulder pain multifactorial. She has evidence of adhesive capsulitis,, As well as some subacromial impingement Would recommend glenohumeral injection If this is not helpful would repeat under ultrasound guidance  Shoulder injection Left   Indication:Left Shoulder pain not relieved by medication management and other conservative care.  Informed consent was obtained after describing risks and benefits of the procedure with the patient, this includes bleeding, bruising, infection and medication side effects. The patient wishes to proceed and has given written consent. Patient was placed in a seated position. The Left shoulder was marked and prepped with betadine in the subacromial area. A 25-gauge 1-1/2 inch needle was inserted into the subacromial  area. After negative draw back for blood, a solution containing 1 mL of 6 mg per ML betamethasone and 4 mL of 1% lidocaine was injected. A band aid was applied. The patient tolerated the procedure well. Post procedure instructions were given.  2. Left hemiplegia status post stroke, finishing  Therapy.Follow-up with neurology. Follow with primary care for underlying risk factors

## 2014-11-03 NOTE — Telephone Encounter (Signed)
Pharmacy requesting furosemide refill, but per last office note patient is to be holding this medication. Just wanted to clarify that she should still be off of the medication. Thanks, MI

## 2014-12-04 IMAGING — CR DG ABD PORTABLE 1V
1 series · 1 of 1 positions shown · non-contrast
Comparison: 07/29/2013

CLINICAL DATA: NG tube placement

EXAM:
PORTABLE ABDOMEN - 1 VIEW

[AP]
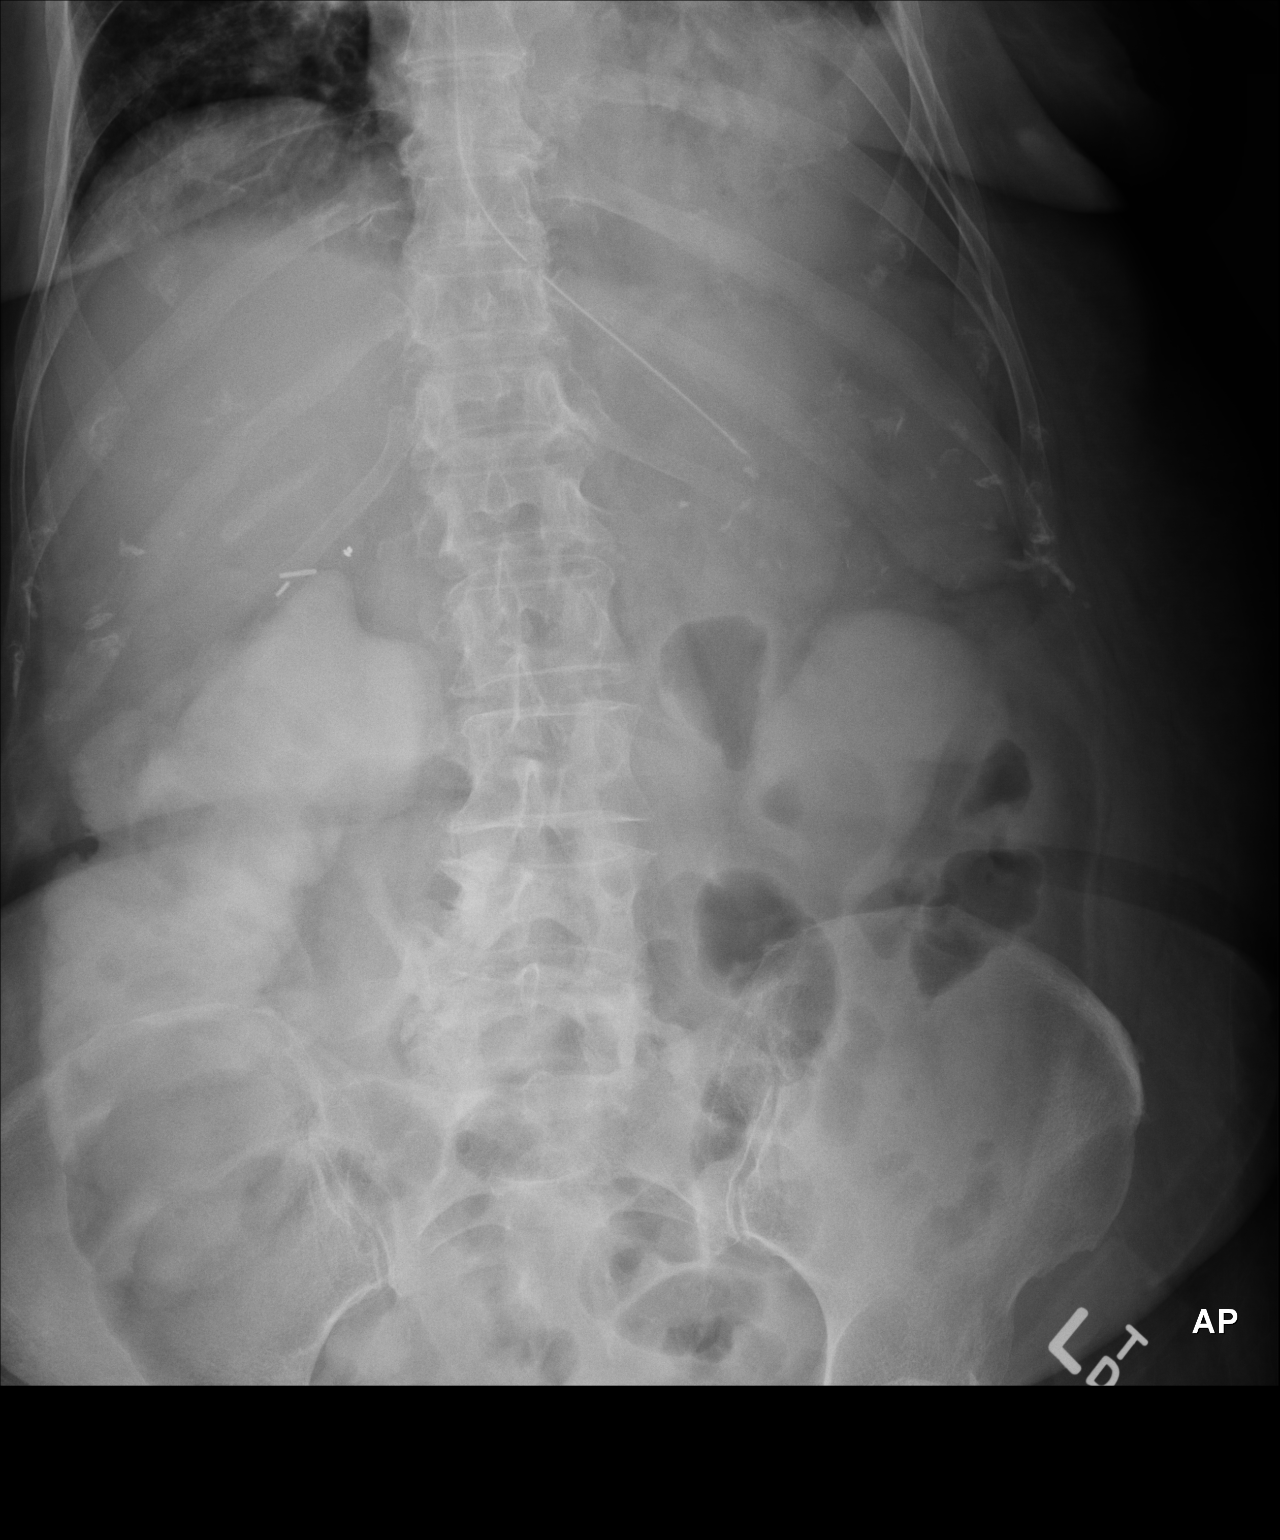

[1 of 1 positions shown; findings below may reference images not displayed]

FINDINGS: NG tube extends into the body of the stomach with side port at the
GE junction. Oral contrast within the colon from recent CT exam. No
evidence of bowel obstruction. There is dense left lower lobe
atelectasis/infiltrate.
IMPRESSION: 1. NG tube in stomach.  Sideport at the GE junction.
2. No evidence of bowel obstruction.
3. Left lower lobe atelectasis/infiltrate

## 2014-12-06 IMAGING — CR DG CHEST 1V PORT
1 series · 1 of 1 positions shown · non-contrast
Comparison: 08/01/2013

CLINICAL DATA: Shortness of breath. Respiratory failure.
Ventilator.

EXAM:
PORTABLE CHEST - 1 VIEW

[AP]
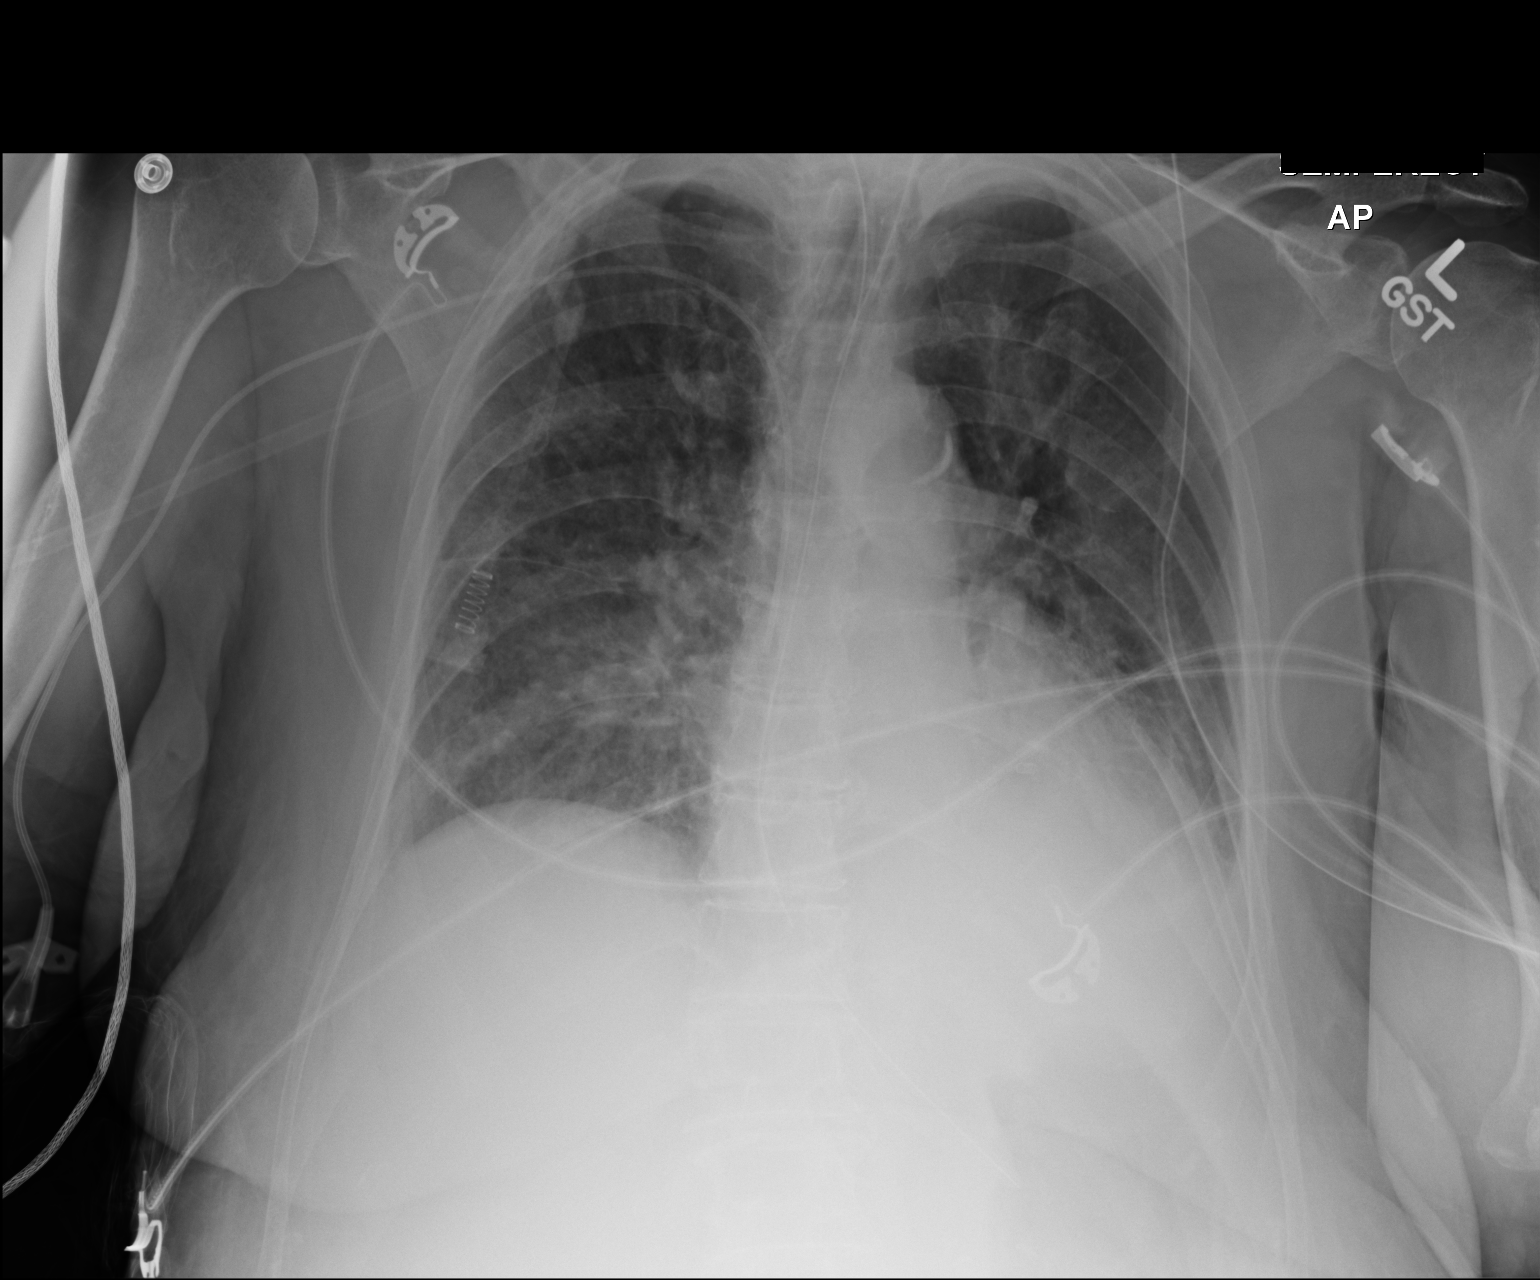

[1 of 1 positions shown; findings below may reference images not displayed]

FINDINGS: Endotracheal tube is in good position the top of the aortic arch. NG
tube tip is in the body of the stomach with side hole at the
gastroesophageal junction. PICC tip is in the superior vena cava of
of the cavoatrial junction.

There has been almost complete clearing of the infiltrate in the
right upper lobe. There is consolidation and a small effusion at the
left base, unchanged. Slight haziness persists at the right base.

Heart size is normal. Pulmonary vascularity is within normal limits.
IMPRESSION: 1. Almost complete clearing of the right upper lobe infiltrate.
2. Persistent consolidation small effusion at the left base.

## 2014-12-08 ENCOUNTER — Ambulatory Visit: Payer: Commercial Managed Care - HMO | Admitting: Physical Medicine & Rehabilitation

## 2014-12-21 ENCOUNTER — Other Ambulatory Visit: Payer: Self-pay

## 2014-12-21 NOTE — Patient Outreach (Signed)
[facility]) Care Management  12/21/2014  [name] 01/19/1941 [id number]  Telephone Screen  Referral Date: 12/20/14  Referral [facility] tier 4 list Referral Reason:CHF,COPD with 3 ED visits and 2 admits   Outreach attempt # 1 to patient. Patient reached. Discussed and reviewed [facility] services. Patient was offered program several months ago and declined. Patient voices that she is managing her care at this time. She reported that the only "trouble" she is having is chronic back pain in which "there is nothing that can be done for that." She states she has appt with specialist coming up soon to follow up on the matter. Patient appreciative of call but declined [facility] services at this time.  Plan: RN CM will notify [facility] administrative assistant that patient declined and case closed.  Enzo Montgomery, RN,BSN,CCM [facility] Telephonic Care Management Coordinator Direct Phone: [phone] Toll Free: [phone] Fax: [phone]

## 2015-01-21 IMAGING — CR DG ABD PORTABLE 2V
2 series · 2 of 2 positions shown · non-contrast
Comparison: CT abdomen and pelvis 09/16/2013.  Abdomen 08/17/2013.

CLINICAL DATA: NG tube insertion it and followup small bowel
obstruction.

EXAM:
PORTABLE ABDOMEN - 2 VIEW

[AP]
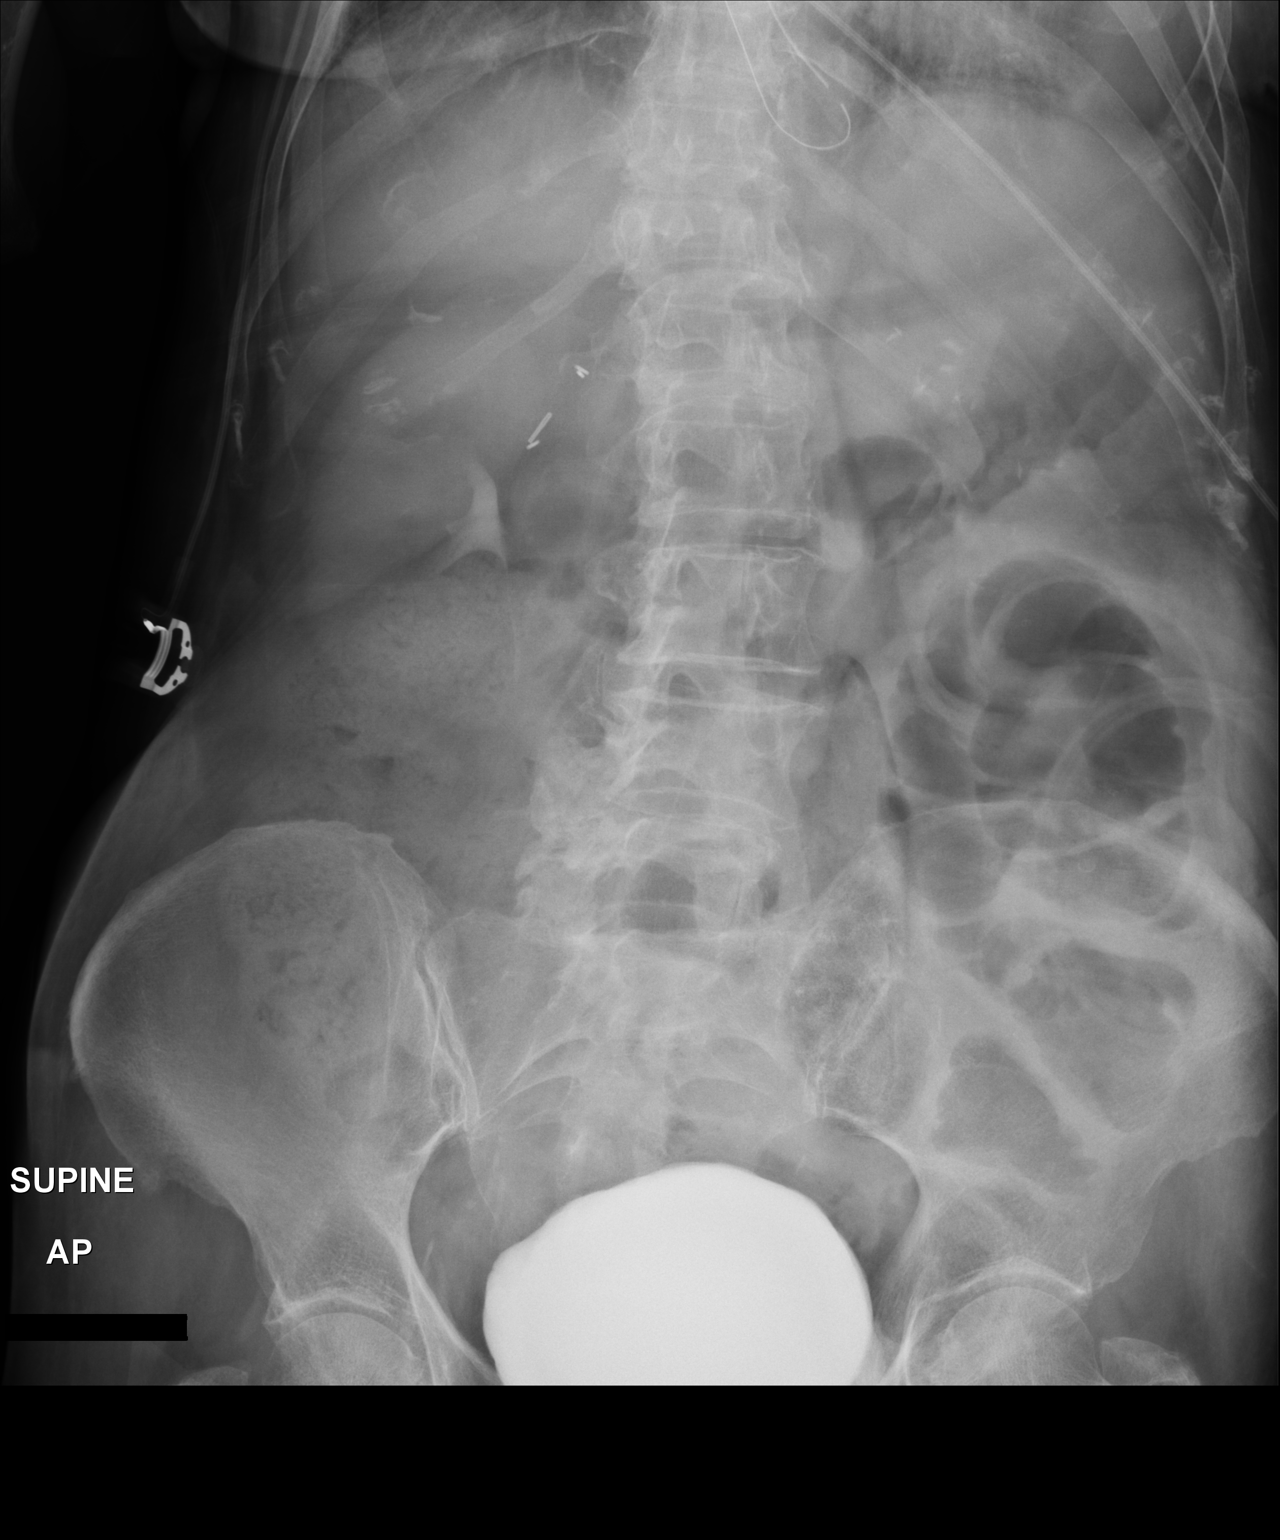

[ap lld]
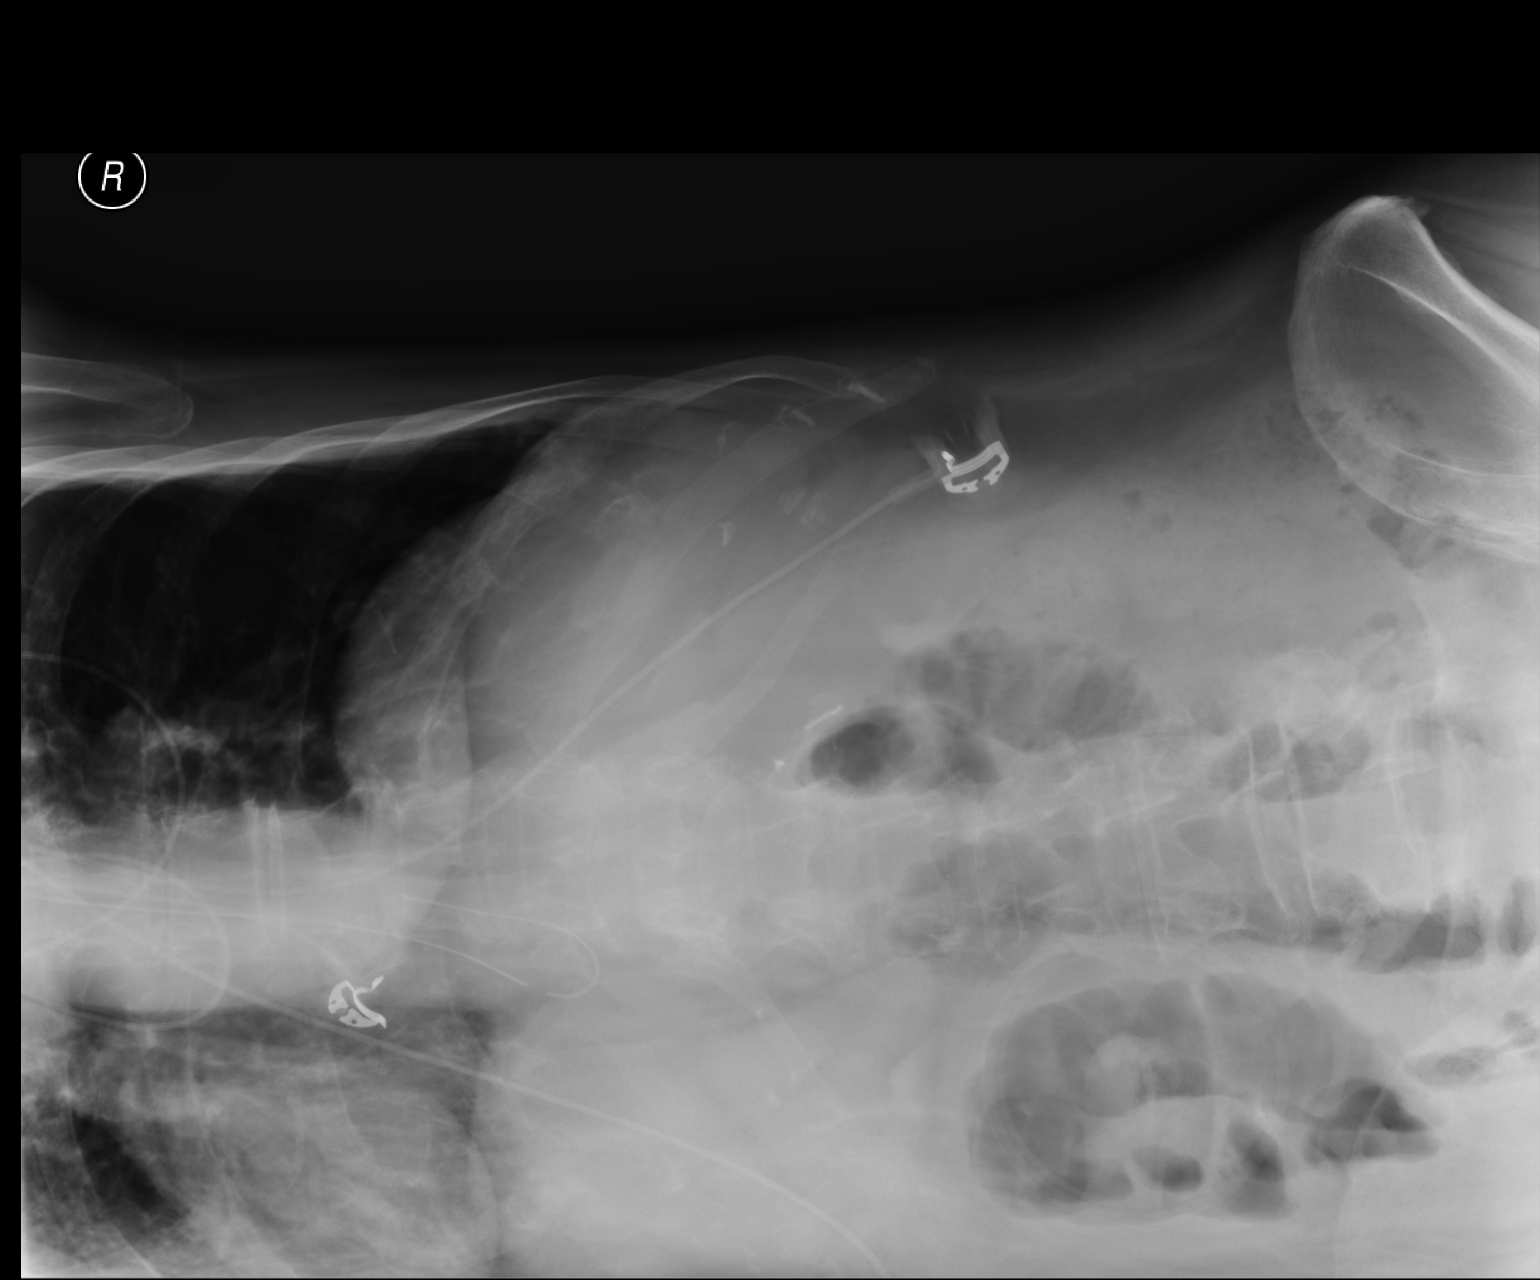

[2 of 2 positions shown; findings below may reference images not displayed]

FINDINGS: Enteric tube tip is coiled in the lower chest consistent with
location in the distal esophagus or possibly within a hiatal hernia.
Stool-filled colon with with mild gaseous distention of left lower
quadrant bowel. Left lower quadrant ostomy is present. Residual
contrast material in the bladder and renal collecting systems,
likely from recent CT scan. Air-fluid levels are demonstrated. No
free intra-abdominal air.
IMPRESSION: Persistent changes consistent with small bowel obstruction. Enteric
tube tip appears to be coiled in the distal esophagus.

## 2015-01-23 ENCOUNTER — Encounter (HOSPITAL_COMMUNITY): Payer: Self-pay | Admitting: Neurology

## 2015-01-23 ENCOUNTER — Emergency Department (HOSPITAL_COMMUNITY)
Admission: EM | Admit: 2015-01-23 | Discharge: 2015-01-24 | Disposition: A | Discharge status: Home / Self Care | Payer: Medicare Other | Attending: Emergency Medicine | Admitting: Emergency Medicine

## 2015-01-23 ENCOUNTER — Emergency Department (HOSPITAL_COMMUNITY): Payer: Medicare Other

## 2015-01-23 DIAGNOSIS — Z7982 Long term (current) use of aspirin: Secondary | ICD-10-CM | POA: Insufficient documentation

## 2015-01-23 DIAGNOSIS — G8929 Other chronic pain: Secondary | ICD-10-CM | POA: Diagnosis not present

## 2015-01-23 DIAGNOSIS — Z86010 Personal history of colonic polyps: Secondary | ICD-10-CM | POA: Insufficient documentation

## 2015-01-23 DIAGNOSIS — I10 Essential (primary) hypertension: Secondary | ICD-10-CM | POA: Diagnosis not present

## 2015-01-23 DIAGNOSIS — M5412 Radiculopathy, cervical region: Secondary | ICD-10-CM | POA: Insufficient documentation

## 2015-01-23 DIAGNOSIS — J449 Chronic obstructive pulmonary disease, unspecified: Secondary | ICD-10-CM | POA: Insufficient documentation

## 2015-01-23 DIAGNOSIS — F329 Major depressive disorder, single episode, unspecified: Secondary | ICD-10-CM | POA: Insufficient documentation

## 2015-01-23 DIAGNOSIS — M1990 Unspecified osteoarthritis, unspecified site: Secondary | ICD-10-CM | POA: Insufficient documentation

## 2015-01-23 DIAGNOSIS — D649 Anemia, unspecified: Secondary | ICD-10-CM | POA: Diagnosis not present

## 2015-01-23 DIAGNOSIS — K219 Gastro-esophageal reflux disease without esophagitis: Secondary | ICD-10-CM | POA: Diagnosis not present

## 2015-01-23 DIAGNOSIS — R4781 Slurred speech: Secondary | ICD-10-CM | POA: Diagnosis not present

## 2015-01-23 DIAGNOSIS — Z86718 Personal history of other venous thrombosis and embolism: Secondary | ICD-10-CM | POA: Diagnosis not present

## 2015-01-23 DIAGNOSIS — Z8673 Personal history of transient ischemic attack (TIA), and cerebral infarction without residual deficits: Secondary | ICD-10-CM | POA: Insufficient documentation

## 2015-01-23 DIAGNOSIS — R531 Weakness: Secondary | ICD-10-CM | POA: Diagnosis not present

## 2015-01-23 DIAGNOSIS — Z79899 Other long term (current) drug therapy: Secondary | ICD-10-CM | POA: Diagnosis not present

## 2015-01-23 DIAGNOSIS — M542 Cervicalgia: Secondary | ICD-10-CM | POA: Diagnosis not present

## 2015-01-23 DIAGNOSIS — Z9889 Other specified postprocedural states: Secondary | ICD-10-CM | POA: Insufficient documentation

## 2015-01-23 DIAGNOSIS — E785 Hyperlipidemia, unspecified: Secondary | ICD-10-CM | POA: Diagnosis not present

## 2015-01-23 DIAGNOSIS — I2510 Atherosclerotic heart disease of native coronary artery without angina pectoris: Secondary | ICD-10-CM | POA: Diagnosis not present

## 2015-01-23 DIAGNOSIS — Z87442 Personal history of urinary calculi: Secondary | ICD-10-CM | POA: Insufficient documentation

## 2015-01-23 DIAGNOSIS — M5030 Other cervical disc degeneration, unspecified cervical region: Secondary | ICD-10-CM | POA: Diagnosis not present

## 2015-01-23 DIAGNOSIS — M47892 Other spondylosis, cervical region: Secondary | ICD-10-CM | POA: Diagnosis not present

## 2015-01-23 DIAGNOSIS — R011 Cardiac murmur, unspecified: Secondary | ICD-10-CM | POA: Insufficient documentation

## 2015-01-23 DIAGNOSIS — M47812 Spondylosis without myelopathy or radiculopathy, cervical region: Secondary | ICD-10-CM

## 2015-01-23 DIAGNOSIS — Z8701 Personal history of pneumonia (recurrent): Secondary | ICD-10-CM | POA: Insufficient documentation

## 2015-01-23 DIAGNOSIS — F17210 Nicotine dependence, cigarettes, uncomplicated: Secondary | ICD-10-CM | POA: Diagnosis not present

## 2015-01-23 LAB — COMPREHENSIVE METABOLIC PANEL
ALBUMIN: 3.5 g/dL (ref 3.5–5.0)
ALT: 17 U/L (ref 14–54)
ANION GAP: 8 (ref 5–15)
AST: 22 U/L (ref 15–41)
Alkaline Phosphatase: 110 U/L (ref 38–126)
BUN: 12 mg/dL (ref 6–20)
CHLORIDE: 109 mmol/L (ref 101–111)
CO2: 24 mmol/L (ref 22–32)
Calcium: 9.2 mg/dL (ref 8.9–10.3)
Creatinine, Ser: 0.46 mg/dL (ref 0.44–1.00)
GFR calc Af Amer: >60 mL/min (ref >60)
GFR calc non Af Amer: >60 mL/min (ref >60)
GLUCOSE: 97 mg/dL (ref 65–99)
POTASSIUM: 4.1 mmol/L (ref 3.5–5.1)
SODIUM: 141 mmol/L (ref 135–145)
TOTAL PROTEIN: 6.4 g/dL — AB (ref 6.5–8.1)
Total Bilirubin: 0.5 mg/dL (ref 0.3–1.2)

## 2015-01-23 LAB — DIFFERENTIAL
BASOS PCT: 1 %
Basophils Absolute: 0 10*3/uL (ref 0.0–0.1)
EOS ABS: 0.2 10*3/uL (ref 0.0–0.7)
EOS PCT: 3 %
Lymphocytes Relative: 41 %
Lymphs Abs: 2.7 10*3/uL (ref 0.7–4.0)
Monocytes Absolute: 0.4 10*3/uL (ref 0.1–1.0)
Monocytes Relative: 7 %
NEUTROS PCT: 48 %
Neutro Abs: 3.3 10*3/uL (ref 1.7–7.7)

## 2015-01-23 LAB — CBC
HEMATOCRIT: 34.6 % — AB (ref 36.0–46.0)
Hemoglobin: 10.5 g/dL — ABNORMAL LOW (ref 12.0–15.0)
MCH: 23.4 pg — ABNORMAL LOW (ref 26.0–34.0)
MCHC: 30.3 g/dL (ref 30.0–36.0)
MCV: 77.2 fL — ABNORMAL LOW (ref 78.0–100.0)
PLATELETS: 273 10*3/uL (ref 150–400)
RBC: 4.48 MIL/uL (ref 3.87–5.11)
RDW: 17.6 % — AB (ref 11.5–15.5)
WBC: 6.7 10*3/uL (ref 4.0–10.5)

## 2015-01-23 LAB — URINALYSIS, ROUTINE W REFLEX MICROSCOPIC
BILIRUBIN URINE: NEGATIVE
Glucose, UA: NEGATIVE mg/dL
Hgb urine dipstick: NEGATIVE
Ketones, ur: NEGATIVE mg/dL
NITRITE: NEGATIVE
Protein, ur: NEGATIVE mg/dL
SPECIFIC GRAVITY, URINE: 1.018 (ref 1.005–1.030)
pH: 6.5 (ref 5.0–8.0)

## 2015-01-23 LAB — URINE MICROSCOPIC-ADD ON

## 2015-01-23 LAB — I-STAT TROPONIN, ED: Troponin i, poc: 0 ng/mL (ref 0.00–0.08)

## 2015-01-23 LAB — PROTIME-INR
INR: 1.02 (ref 0.00–1.49)
Prothrombin Time: 13.6 seconds (ref 11.6–15.2)

## 2015-01-23 LAB — APTT: APTT: 28 s (ref 24–37)

## 2015-01-23 MED ORDER — TRAMADOL HCL 50 MG PO TABS
50.0000 mg | ORAL_TABLET | Freq: Once | ORAL | Status: AC
Start: 2015-01-23 — End: 2015-01-23
  Administered 2015-01-23: 50 mg via ORAL
  Filled 2015-01-23: qty 1

## 2015-01-23 MED ORDER — TRAMADOL HCL 50 MG PO TABS
50.0000 mg | ORAL_TABLET | Freq: Once | ORAL | Status: AC
  Administered 2015-01-23: 50 mg via ORAL
  Filled 2015-01-23: qty 1

## 2015-01-23 MED ORDER — TRAMADOL HCL 50 MG PO TABS: 50.0000 mg | ORAL_TABLET | Freq: Four times a day (QID) | ORAL | Status: DC | PRN

## 2015-01-23 MED ORDER — PREDNISONE 10 MG PO TABS: 10.0000 mg | ORAL_TABLET | Freq: Once | ORAL | Status: DC

## 2015-01-23 NOTE — ED Notes (Signed)
Patient transported to CT 

## 2015-01-23 NOTE — ED Notes (Signed)
WR to CT

## 2015-01-23 NOTE — ED Provider Notes (Signed)
CSN: [id number]     Arrival date & time 01/23/15  1340 History   First MD Initiated Contact with Patient 01/23/15 1720     Chief Complaint  Patient presents with  . Weakness     (Consider location/radiation/quality/duration/timing/severity/associated sxs/prior Treatment) HPI   [name] is a 75 y.o. female who presents for evaluation of left neck pain radiating to left shoulder with both pain and numb feeling. No headache, dizziness, nausea, vomiting, new weakness or dizziness. No known trauma. She is taking her usual medications, without relief. There are no other known modifying factors.    Past Medical History  Diagnosis Date  . Carotid artery disease ([place])     a. 60-70% bilat ICA stenosis by dopplers 05/2012.  Marland Kitchen Other and unspecified hyperlipidemia     takes Lipitor daily  . CAD (coronary artery disease)     a. Mod dz 2010 initially mgd medically. b. 12/2012 - angina s/p PTCA/DES to mid-circumflex, PTCA/DES to first OM.   Marland Kitchen Barrett's esophagus   . SBO (small bowel obstruction) ([place])     a. Lysis of adhesions and ovarian cystectomy 04/2013 for sBO. b. Admitted 07/2013 for colonic obstruction due to suspected colitis, s/p partial colectomy/colostomy 08/01/13. Admission complicated by anasarca, acute resp failure requiring tracheostomy, decannulated 08/25/13. c. Recurrent SBO8/2015, NGT placed for decompression.   . Colonic obstruction due to suspected colitis; s/p colectomy/colostomy     a. See SBO.  Marland Kitchen Protein-calorie malnutrition, severe w/ electrolyte imbalance     a. Severe hypoalbuminemia leading to 3rd spacing including anasarca and pulm edema 07/2013.  Marland Kitchen DVT of axillary vein, acute right     a. Dx 07/2013 felt due to R IJ central line that had been inserted 7/14. Not on anticoag due to GIB issues and small cerebral bleed.  . Acute respiratory failure with hypoxia s/p tracheostomy 07/2013  . Intracranial hemorrhage ([place])     a. 08/11/13 - per DC summary, tiny SAH vs SDH done  for altered mentation, anticoag stopped including aspirin.  . Anemia     a. 7-08/2013 felt due to recent critical illness/chronic disease.  . Complication of anesthesia     hard to wake up from anesthesia   . Hypertension     takes Metoprolol daily  . HTN (hypertension)   . Heart murmur   . COPD (chronic obstructive pulmonary disease) ([place])   . Pneumonia     hx of->15 yrs ago  . History of bronchitis     > 21yr ago   . Dizziness     was on Bentyl which caused this-took off of it and no problems since  . Stroke ([facility] early 80's    right sided weakness--TIA  . Joint pain   . Chronic back pain     deteriorating;DDD  . Neck pain     DDD  . History of hiatal hernia   . History of colon polyps   . History of kidney stones     was told it was stable but doesn't know for sure that she ever passed it  . Depression     takes Remeron and Zoloft daily  . GERD (gastroesophageal reflux disease)   . Arthritis     handds, knees & back    Past Surgical History  Procedure Laterality Date  . Appendectomy    . Cholecystectomy    . Colon resection    . Coronary angioplasty with stent placement  12/20/2012    STENT TO OM  DR Burt Knack  . Rotator cuff repair Left   . Abdominal hysterectomy    . Colon surgery    . Foot surgery Left   . Hand surgery Bilateral     for ganglion cysts  . Laparotomy N/A 08/01/2013    Procedure: EXPLORATORY LAPAROTOMY;  Surgeon: Harl Bowie, MD;  Location: [place];  Service: General;  Laterality: N/A;  . Partial colectomy N/A 08/01/2013    Procedure: PARTIAL COLECTOMY;  Surgeon: Harl Bowie, MD;  Location: [place];  Service: General;  Laterality: N/A;  . Colostomy N/A 08/01/2013    Procedure: COLOSTOMY;  Surgeon: Harl Bowie, MD;  Location: [place];  Service: General;  Laterality: N/A;  . Bowel resection N/A 08/01/2013    Procedure: SMALL BOWEL RESECTION;  Surgeon: Harl Bowie, MD;  Location: [place];  Service: General;  Laterality: N/A;   . Tracheostomy      feinstein  . Left heart catheterization with coronary angiogram N/A 12/20/2012    Procedure: LEFT HEART CATHETERIZATION WITH CORONARY ANGIOGRAM;  Surgeon: Blane Ohara, MD;  Location: [facility] CATH LAB;  Service: Cardiovascular;  Laterality: N/A;  . Cataract surgery Bilateral   . Colostomy takedown  01/24/2014    dr Ninfa Linden  . Colostomy takedown N/A 01/24/2014    Procedure: COLOSTOMY TAKEDOWN;  Surgeon: Coralie Keens, MD;  Location: [place];  Service: General;  Laterality: N/A;  . Tonsillectomy    . Endarterectomy Right 08/08/2014    Procedure: RIGHT CAROTID ENDARTERECTOMY WITH HEMASHIELD PATCH ANGIOPLASTY;  Surgeon: Elam Dutch, MD;  Location: [facility] OR;  Service: Vascular;  Laterality: Right;   Family History  Problem Relation Age of Onset  . Heart disease    . Varicose Veins Mother   . Heart disease Father   . Heart attack Father   . AAA (abdominal aortic aneurysm) Father    Social History  Substance Use Topics  . Smoking status: Current Some Day Smoker -- 0.50 packs/day for 53 years    Types: Cigarettes  . Smokeless tobacco: Never Used     Comment: also uses e cig, 4 A DAY  . Alcohol Use: No   OB History    No data available     Review of Systems  All other systems reviewed and are negative.     Allergies  Pregabalin and Sulfonamide derivatives  Home Medications   Prior to Admission medications   Medication Sig Start Date End Date Taking? Authorizing Provider  aspirin EC 81 MG tablet Take 162 mg by mouth daily.   Yes Historical Provider, MD  atorvastatin (LIPITOR) 80 MG tablet Take 80 mg by mouth every evening.    Yes Historical Provider, MD  docusate sodium (COLACE) 100 MG capsule Take 100 mg by mouth 2 (two) times daily.   Yes Historical Provider, MD  fish oil-omega-3 fatty acids 1000 MG capsule Take 1,000 mg by mouth daily.    Yes Historical Provider, MD  folic acid (FOLVITE) 017 MCG tablet Take 800 mcg by mouth daily.    Yes Historical  Provider, MD  metoprolol succinate (TOPROL-XL) 25 MG 24 hr tablet Take 25 mg by mouth every morning.   Yes Historical Provider, MD  mirtazapine (REMERON) 30 MG tablet Take 1 tablet by mouth at bedtime.  04/18/14  Yes Historical Provider, MD  nitroGLYCERIN (NITROSTAT) 0.4 MG SL tablet Place 1 tablet (0.4 mg total) under the tongue every 5 (five) minutes as needed for chest pain. 12/13/12  Yes Dorothy Spark, MD  ondansetron (ZOFRAN) 4 MG tablet Take 4 mg by mouth every 8 (eight) hours as needed for nausea or vomiting.    Yes Historical Provider, MD  sertraline (ZOLOFT) 100 MG tablet Take 100 mg by mouth 2 (two) times daily. 06/27/14  Yes Historical Provider, MD  furosemide (LASIX) 40 MG tablet TAKE 1 TABLET DAILY. 11/03/14   Dorothy Spark, MD  gabapentin (NEURONTIN) 300 MG capsule Take 1 capsule (300 mg total) by mouth 3 (three) times daily. 11/02/14   Charlett Blake, MD  pantoprazole (PROTONIX) 40 MG tablet TAKE 1 TABLET TWICE DAILY 03/28/14   Dorothy Spark, MD  polyethylene glycol [employer] / GLYCOLAX) packet Take 17 g by mouth daily. 08/31/14   Barton Dubois, MD  predniSONE (DELTASONE) 10 MG tablet Take 1 tablet (10 mg total) by mouth once. 01/23/15   Daleen Bo, MD  traMADol (ULTRAM) 50 MG tablet Take 1 tablet (50 mg total) by mouth every 6 (six) hours as needed. 01/23/15   Daleen Bo, MD   BP 99/40 mmHg  Pulse 77  Temp(Src) 99.1 F (37.3 C) (Oral)  Resp 21  SpO2 95% Physical Exam  Constitutional: She is oriented to person, place, and time. She appears well-developed and well-nourished.  Elderly, frail  HENT:  Head: Normocephalic and atraumatic.  Right Ear: External ear normal.  Left Ear: External ear normal.  Eyes: Conjunctivae and EOM are normal. Pupils are equal, round, and reactive to light.  Neck: Normal range of motion and phonation normal. Neck supple.  Cardiovascular: Normal rate, regular rhythm and normal heart sounds.   Pulmonary/Chest: Effort normal and breath  sounds normal. She exhibits no bony tenderness.  Abdominal: Soft. There is no tenderness.  Musculoskeletal: Normal range of motion.  Tender left lateral neck with increased pain, left lateral neck, and radiating pain into the left shoulder with left lateral neck bending.  Neurological: She is alert and oriented to person, place, and time. No cranial nerve deficit or sensory deficit. She exhibits normal muscle tone. Coordination normal.  Skin: Skin is warm, dry and intact.  Psychiatric: She has a normal mood and affect. Her behavior is normal.  Nursing note and vitals reviewed.   ED Course  Procedures (including critical care time)  Medications  traMADol (ULTRAM) tablet 50 mg (50 mg Oral Given 01/23/15 2025)  traMADol (ULTRAM) tablet 50 mg (50 mg Oral Given 01/23/15 2155)    Patient Vitals for the past 24 hrs:  BP Temp Temp src Pulse Resp SpO2  01/23/15 2330 (!) 99/40 mmHg - - 77 - 95 %  01/23/15 2245 (!) 110/43 mmHg - - 77 - 95 %  01/23/15 2230 (!) 121/47 mmHg - - 80 - 95 %  01/23/15 2215 (!) 117/46 mmHg - - 79 - 96 %  01/23/15 2200 (!) 130/48 mmHg - - 75 - 95 %  01/23/15 2158 - - - 79 - 98 %  01/23/15 2157 - - - 79 - 97 %  01/23/15 2156 121/77 mmHg - - - - -  01/23/15 2100 (!) 119/46 mmHg - - 79 21 96 %  01/23/15 2045 (!) 125/47 mmHg - - 82 22 97 %  01/23/15 2030 113/63 mmHg - - 83 23 97 %  01/23/15 2021 - 99.1 F (37.3 C) - - - -  01/23/15 2000 (!) 130/40 mmHg - - 82 20 97 %  01/23/15 1930 136/64 mmHg - - 75 18 97 %  01/23/15 1915 144/62 mmHg - - 79 20 98 %  01/23/15 1830 146/57 mmHg - - 75 15 98 %  01/23/15 1815 153/69 mmHg - - 80 19 98 %  01/23/15 1800 133/83 mmHg - - 76 20 97 %  01/23/15 1745 137/58 mmHg - - 77 20 97 %  01/23/15 1406 151/64 mmHg 98.5 F (36.9 C) Oral 86 16 98 %    At D/C Reevaluation with update and discussion. After initial assessment and treatment, an updated evaluation reveals patient is alert, calm and pain-free. No additional c/o. Findings discussed  with patient and all questions answered. [place] Review Labs Reviewed  CBC - Abnormal; Notable for the following:    Hemoglobin 10.5 (*)    HCT 34.6 (*)    MCV 77.2 (*)    MCH 23.4 (*)    RDW 17.6 (*)    All other components within normal limits  COMPREHENSIVE METABOLIC PANEL - Abnormal; Notable for the following:    Total Protein 6.4 (*)    All other components within normal limits  URINALYSIS, ROUTINE W REFLEX MICROSCOPIC (NOT AT [facility]) - Abnormal; Notable for the following:    Leukocytes, UA TRACE (*)    All other components within normal limits  URINE MICROSCOPIC-ADD ON - Abnormal; Notable for the following:    Squamous Epithelial / LPF 6-30 (*)    Bacteria, UA MANY (*)    All other components within normal limits  URINE CULTURE  PROTIME-INR  APTT  DIFFERENTIAL  I-STAT TROPOININ, ED  CBG MONITORING, ED  I-STAT CHEM 8, ED    Imaging Review Ct Head Wo Contrast  01/23/2015  CLINICAL DATA:  Left-sided weakness for 3 weeks. Intermittent slurred speech. Somnolence. Neck pain. EXAM: CT HEAD WITHOUT CONTRAST TECHNIQUE: Contiguous axial images were obtained from the base of the skull through the vertex without intravenous contrast. COMPARISON:  CT head 08/08/14.  MR head 08/09/14. FINDINGS: No evidence for acute infarction, hemorrhage, mass lesion, hydrocephalus, or extra-axial fluid. Moderate cerebral and cerebellar atrophy. Chronic RIGHT hemisphere infarction, predominantly posterior frontoparietal in location. Calvarium intact. Vascular calcification. Chronic sinus disease. Severe degenerative change LEFT TMJ. Compared with priors, the infarction was acute in July. IMPRESSION: Chronic RIGHT hemisphere infarction. No acute intracranial findings. Electronically Signed   By: Staci Righter M.D.   On: 01/23/2015 15:05   Ct Cervical Spine Wo Contrast  01/23/2015  CLINICAL DATA:  Initial evaluation for acute left neck and shoulder radicular pain for 2 weeks. Left-sided  numbness. EXAM: CT CERVICAL SPINE WITHOUT CONTRAST TECHNIQUE: Multidetector CT imaging of the cervical spine was performed without intravenous contrast. Multiplanar CT image reconstructions were also generated. FINDINGS: Vertebral bodies are normally aligned with preservation of the normal cervical lordosis. Vertebral body heights well preserved. No fracture. No malalignment or listhesis. Normal C1-2 articulations are preserved. Dens is intact. Paraspinous and prevertebral soft tissues demonstrate no acute abnormality. Thyroid gland normal. Mild atheromatous plaque about the carotid bifurcations. Mild centrilobular emphysema within the visualized lung apices. C2-C3: Minimal annular disc bulge with bilateral uncovertebral hypertrophy. Left worse than right facet arthrosis. Mild left foraminal narrowing. No canal or right foraminal stenosis. C3-C4: No significant disc bulge. Right greater than left uncovertebral hypertrophy. Right greater than left facet arthrosis. Resultant moderate right foraminal narrowing. Very mild left foraminal stenosis related to uncovertebral spurring and facet disease. No significant canal stenosis. C4-C5: Diffuse disc bulging. Right-sided uncovertebral hypertrophy with exuberant right-sided facet arthrosis. Resultant severe right foraminal stenosis. Mild left uncovertebral hypertrophy and facet arthrosis with resultant moderate left foraminal narrowing.  C5-C6: Diffuse disc bulging. Bilateral uncovertebral hypertrophy with bilateral facet arthrosis. Probable moderate bilateral foraminal narrowing, slightly worse on the right. No significant canal narrowing. C6-C7: Posterior disc protrusion with associated annular calcification. Mild central canal stenosis. Bilateral uncovertebral spurring without significant foraminal stenosis. C7-T1: Mild degenerative disc bulge without significant canal stenosis. Foramina appear to be widely patent. Visualized upper thoracic spine demonstrates no acute  abnormality. IMPRESSION: 1. No acute abnormality within the cervical spine. 2. Degenerative spondylolysis with uncovertebral spurring and facet arthrosis with resulting severe right foraminal stenosis at C4-5, with more moderate right foraminal narrowing at C3-4 and C5-6. 3. Mild moderate left foraminal narrowing at C4-5 and C5-6, with more mild narrowing at C2-3 and C3-4 related to uncovertebral spurring and facet disease. 4. Posterior disc protrusion at C6-7 with resultant mild canal stenosis. Electronically Signed   By: Jeannine Boga M.D.   On: 01/23/2015 22:45   I have personally reviewed and evaluated these images and lab results as part of my medical decision-making.   EKG Interpretation   Date/Time:  Tuesday January 23 2015 14:11:45 EST Ventricular Rate:  86 PR Interval:  174 QRS Duration: 74 QT Interval:  388 QTC Calculation: 464 R Axis:   89 Text Interpretation:  Normal sinus rhythm Normal ECG since last tracing no  significant change Confirmed by Annise Boran  MD, Crissa Sowder ([id number]) on 01/23/2015  7:03:59 PM      MDM   Final diagnoses:  Cervical radiculopathy  Degenerative joint disease of cervical spine    Left cervical radicular pain d/t cervical DJD/DDD. Doubt spinal myelopathy, CVA, SBI or impending vascular collapse.  Nursing Notes Reviewed/ Care Coordinated Applicable Imaging Reviewed Interpretation of Laboratory Data incorporated into ED treatment  The patient appears reasonably screened and/or stabilized for discharge and I doubt any other medical condition or other [facility] requiring further screening, evaluation, or treatment in the ED at this time prior to discharge.  Plan: Home Medications- Tramadol; Home Treatments- Heat; return here if the recommended treatment, does not improve the symptoms; Recommended follow up- PCP 1 week for check up    Daleen Bo, MD 01/24/15 1156

## 2015-01-23 NOTE — ED Notes (Signed)
Patient family member came to nursing station stating When is the MD coming to see patient advised, MD made aware they have not been seen and states he will come in shortly.

## 2015-01-23 NOTE — ED Notes (Signed)
MD made aware family wants to see MD

## 2015-01-23 NOTE — ED Notes (Signed)
Pt reports left sided weakness for 3 weeks. Has hx of CVA in past. Has intermittent slurred speech. Also pt has been sleeping a lot lately. Has chronic neck pain. Is alert, follows commands.

## 2015-01-24 LAB — URINE CULTURE: SPECIAL REQUESTS: NORMAL

## 2015-01-26 ENCOUNTER — Other Ambulatory Visit: Payer: Self-pay | Admitting: Cardiology

## 2015-01-26 MED ORDER — PANTOPRAZOLE SODIUM 40 MG PO TBEC: 40.0000 mg | DELAYED_RELEASE_TABLET | Freq: Two times a day (BID) | ORAL | Status: DC

## 2015-01-26 MED ORDER — METOPROLOL SUCCINATE ER 25 MG PO TB24: 25.0000 mg | ORAL_TABLET | ORAL | Status: DC

## 2015-02-23 ENCOUNTER — Encounter: Payer: Self-pay | Admitting: Vascular Surgery

## 2015-03-01 ENCOUNTER — Ambulatory Visit (INDEPENDENT_AMBULATORY_CARE_PROVIDER_SITE_OTHER): Payer: Medicare Other | Admitting: Neurology

## 2015-03-01 ENCOUNTER — Encounter: Payer: Self-pay | Admitting: Neurology

## 2015-03-01 ENCOUNTER — Ambulatory Visit (HOSPITAL_COMMUNITY)
Admission: RE | Admit: 2015-03-01 | Discharge: 2015-03-01 | Disposition: A | Discharge status: Home / Self Care | Payer: Medicare Other | Source: Ambulatory Visit | Attending: Vascular Surgery | Admitting: Vascular Surgery

## 2015-03-01 ENCOUNTER — Ambulatory Visit (INDEPENDENT_AMBULATORY_CARE_PROVIDER_SITE_OTHER): Payer: Medicare Other | Admitting: Vascular Surgery

## 2015-03-01 ENCOUNTER — Encounter: Payer: Self-pay | Admitting: Vascular Surgery

## 2015-03-01 VITALS — BP 166/82 | HR 73 | Ht 60.0 in | Wt 166.2 lb

## 2015-03-01 VITALS — BP 130/66 | HR 78 | Wt 168.8 lb

## 2015-03-01 DIAGNOSIS — I10 Essential (primary) hypertension: Secondary | ICD-10-CM | POA: Insufficient documentation

## 2015-03-01 DIAGNOSIS — F17200 Nicotine dependence, unspecified, uncomplicated: Secondary | ICD-10-CM | POA: Diagnosis not present

## 2015-03-01 DIAGNOSIS — I63411 Cerebral infarction due to embolism of right middle cerebral artery: Secondary | ICD-10-CM | POA: Diagnosis not present

## 2015-03-01 DIAGNOSIS — Z9889 Other specified postprocedural states: Secondary | ICD-10-CM

## 2015-03-01 DIAGNOSIS — I6521 Occlusion and stenosis of right carotid artery: Secondary | ICD-10-CM

## 2015-03-01 DIAGNOSIS — I6523 Occlusion and stenosis of bilateral carotid arteries: Secondary | ICD-10-CM | POA: Diagnosis not present

## 2015-03-01 DIAGNOSIS — Z48812 Encounter for surgical aftercare following surgery on the circulatory system: Secondary | ICD-10-CM | POA: Diagnosis not present

## 2015-03-01 NOTE — Progress Notes (Signed)
Referring Physician:  History of Present Illness:  Patient is a 75 y.o. year old female who presents for evaluation of repeat carotid duplex.  S/P right CEA 08/08/2015.   She continues to be depressed s/p her right CVA.  She is making some progress.  Improved  facial droop, and left leg strength.  She continues to ambulate with a st. [place].   No recent amaurosis fugax, speech changes, or weakness.  No symptoms of new stroke or TIA.  Other medical problems include has Hyperlipidemia, mixed; TOBACCO ABUSE; Carotid artery disease ([place]); Chest pain; Other and unspecified angina pectoris; CAD (coronary artery disease); Depression; Barrett's esophagus; Arthritis; Colonic obstruction due to suspected colitis; s/p colectomy/colostomy; Hypokalemia; Hypomagnesemia; Protein-calorie malnutrition, severe w/ electrolyte imbalance; Anasarca; Acute respiratory failure with hypoxia s/p tracheostomy; Physical deconditioning; HTN (hypertension); Dehydration with hypernatremia; DVT of axillary vein, acute right; Anemia; SBO (small bowel obstruction) ([place]); H/O tracheostomy ([place]); Malnutrition of moderate degree ([place]); Atypical chest pain; Chronic diastolic CHF (congestive heart failure), NYHA class 2 ([place]); S/P colostomy takedown; Acute respiratory failure ([place]); Carotid disease, bilateral ([place]); Coronary artery disease due to lipid rich plaque; Essential hypertension; Hyperlipidemia; Carotid artery stenosis, symptomatic; Acute right arterial ischemic stroke, middle cerebral artery (MCA) ([place]); Left hemiparesis ([place]); Other impaction of intestine ([place]); Subacromial impingement of left shoulder; Adhesive capsulitis of left shoulder; Carotid stenosis; S/P carotid endarterectomy; and Cerebrovascular accident (CVA) due to embolism of right middle cerebral artery ([place]) on her problem list.  Past Medical History  Diagnosis Date  . Carotid artery disease ([place])     a. 60-70% bilat ICA stenosis by dopplers 05/2012.  Marland Kitchen Other and  unspecified hyperlipidemia     takes Lipitor daily  . CAD (coronary artery disease)     a. Mod dz 2010 initially mgd medically. b. 12/2012 - angina s/p PTCA/DES to mid-circumflex, PTCA/DES to first OM.   Marland Kitchen Barrett's esophagus   . SBO (small bowel obstruction) ([place])     a. Lysis of adhesions and ovarian cystectomy 04/2013 for sBO. b. Admitted 07/2013 for colonic obstruction due to suspected colitis, s/p partial colectomy/colostomy 08/01/13. Admission complicated by anasarca, acute resp failure requiring tracheostomy, decannulated 08/25/13. c. Recurrent SBO8/2015, NGT placed for decompression.   . Colonic obstruction due to suspected colitis; s/p colectomy/colostomy     a. See SBO.  Marland Kitchen Protein-calorie malnutrition, severe w/ electrolyte imbalance     a. Severe hypoalbuminemia leading to 3rd spacing including anasarca and pulm edema 07/2013.  Marland Kitchen DVT of axillary vein, acute right     a. Dx 07/2013 felt due to R IJ central line that had been inserted 7/14. Not on anticoag due to GIB issues and small cerebral bleed.  . Acute respiratory failure with hypoxia s/p tracheostomy 07/2013  . Intracranial hemorrhage ([place])     a. 08/11/13 - per DC summary, tiny SAH vs SDH done for altered mentation, anticoag stopped including aspirin.  . Anemia     a. 7-08/2013 felt due to recent critical illness/chronic disease.  . Complication of anesthesia     hard to wake up from anesthesia   . Hypertension     takes Metoprolol daily  . HTN (hypertension)   . Heart murmur   . COPD (chronic obstructive pulmonary disease) ([place])   . Pneumonia     hx of->15 yrs ago  . History of bronchitis     > 59yr ago   . Dizziness     was on Bentyl which caused this-took off of it  and no problems since  . Stroke [facility]) early 80's    right sided weakness--TIA  . Joint pain   . Chronic back pain     deteriorating;DDD  . Neck pain     DDD  . History of hiatal hernia   . History of colon polyps   . History of kidney stones     was told  it was stable but doesn't know for sure that she ever passed it  . Depression     takes Remeron and Zoloft daily  . GERD (gastroesophageal reflux disease)   . Arthritis     handds, knees & back     Past Surgical History  Procedure Laterality Date  . Appendectomy    . Cholecystectomy    . Colon resection    . 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 Rotator cuff repair Left   . Abdominal hysterectomy    . Colon surgery    . Foot surgery Left   . Hand surgery Bilateral     for ganglion cysts  . Laparotomy N/A 08/01/2013    Procedure: EXPLORATORY LAPAROTOMY;  Surgeon: Harl Bowie, MD;  Location: [facility];  Service: General;  Laterality: N/A;  . Partial colectomy N/A 08/01/2013    Procedure: PARTIAL COLECTOMY;  Surgeon: Harl Bowie, MD;  Location: [place];  Service: General;  Laterality: N/A;  . Colostomy N/A 08/01/2013    Procedure: COLOSTOMY;  Surgeon: Harl Bowie, MD;  Location: [facility];  Service: General;  Laterality: N/A;  . Bowel resection N/A 08/01/2013    Procedure: SMALL BOWEL RESECTION;  Surgeon: Harl Bowie, MD;  Location: [facility];  Service: General;  Laterality: N/A;  . Tracheostomy      feinstein  . Left heart catheterization with coronary angiogram N/A 12/20/2012    Procedure: LEFT HEART CATHETERIZATION WITH CORONARY ANGIOGRAM;  Surgeon: Blane Ohara, MD;  Location: [facility] CATH LAB;  Service: Cardiovascular;  Laterality: N/A;  . Cataract surgery Bilateral   . Colostomy takedown  01/24/2014    dr Ninfa Linden  . Colostomy takedown N/A 01/24/2014    Procedure: COLOSTOMY TAKEDOWN;  Surgeon: Coralie Keens, MD;  Location: [facility];  Service: General;  Laterality: N/A;  . Tonsillectomy    . Endarterectomy Right 08/08/2014    Procedure: RIGHT CAROTID ENDARTERECTOMY WITH HEMASHIELD PATCH ANGIOPLASTY;  Surgeon: Elam Dutch, MD;  [facility];  Service: Vascular;  Laterality: Right;    Social History Social  History  Substance Use Topics  . Smoking status: Current Some Day Smoker -- 0.50 packs/day for 53 years    Types: Cigarettes  . Smokeless tobacco: Never Used     Comment: also uses e cig, 4 A DAY  . Alcohol Use: No    Family History Family History  Problem Relation Age of Onset  . Heart disease    . Varicose Veins Mother   . Heart disease Father   . Heart attack Father   . AAA (abdominal aortic aneurysm) Father     Allergies  Allergies  Allergen Reactions  . Pregabalin Swelling    Tongue swelling  . Sulfonamide Derivatives Swelling    Childhood reaction     Current Outpatient Prescriptions  Medication Sig Dispense Refill  . aspirin EC 81 MG tablet Take 162 mg by mouth daily.    Marland Kitchen atorvastatin (LIPITOR) 80 MG tablet Take 80 mg  by mouth every evening.     . docusate sodium (COLACE) 100 MG capsule Take 100 mg by mouth 2 (two) times daily.    . fish oil-omega-3 fatty acids 1000 MG capsule Take 1,000 mg by mouth daily.     . folic acid (FOLVITE) 425 MCG tablet Take 800 mcg by mouth daily.     Marland Kitchen gabapentin (NEURONTIN) 300 MG capsule Take 1 capsule (300 mg total) by mouth 3 (three) times daily. 90 capsule 1  . metoprolol succinate (TOPROL-XL) 25 MG 24 hr tablet Take 1 tablet (25 mg total) by mouth every morning. 90 tablet 0  . mirtazapine (REMERON) 30 MG tablet Take 1 tablet by mouth at bedtime.     . nitroGLYCERIN (NITROSTAT) 0.4 MG SL tablet Place 1 tablet (0.4 mg total) under the tongue every 5 (five) minutes as needed for chest pain. 25 tablet 3  . ondansetron (ZOFRAN) 4 MG tablet Take 4 mg by mouth every 8 (eight) hours as needed for nausea or vomiting.     . pantoprazole (PROTONIX) 40 MG tablet Take 1 tablet (40 mg total) by mouth 2 (two) times daily. 180 tablet 0  . polyethylene glycol (MIRALAX / GLYCOLAX) packet Take 17 g by mouth daily. 30 each 1  . sertraline (ZOLOFT) 100 MG tablet Take 100 mg by mouth 2 (two) times daily.     No current facility-administered  medications for this visit.    ROS:   General:  No weight loss, Fever, chills  HEENT: No recent headaches, no nasal bleeding, no visual changes, no sore throat  Neurologic: No dizziness, blackouts, seizures. No recent symptoms of stroke or mini- stroke. No recent episodes of slurred speech, or temporary blindness.  Cardiac: No recent episodes of chest pain/pressure, no shortness of breath at rest.  No shortness of breath with exertion.  Denies history of atrial fibrillation or irregular heartbeat  Vascular: No history of rest pain in feet.  No history of claudication.  No history of non-healing ulcer, No history of DVT   Pulmonary: No home oxygen, no productive cough, no hemoptysis,  No asthma or wheezing  Musculoskeletal:  '[ ]'$  Arthritis, '[ ]'$  Low back pain,  '[ ]'$  Joint pain  Hematologic:No history of hypercoagulable state.  No history of easy bleeding.  No history of anemia  Gastrointestinal: No hematochezia or melena,  No gastroesophageal reflux, no trouble swallowing  Urinary: '[ ]'$  chronic Kidney disease, '[ ]'$  on HD - '[ ]'$  MWF or '[ ]'$  TTHS, '[ ]'$  Burning with urination, '[ ]'$  Frequent urination, '[ ]'$  Difficulty urinating;   Skin: No rashes  Psychological: No history of anxiety,  positve history of depression   Physical Examination  Filed Vitals:   03/01/15 1559 03/01/15 1600  BP: 178/78 166/82  Pulse: 73   Height: 5' (1.524 m)   Weight: 166 lb 3.2 oz (75.388 kg)   SpO2: 100%     Body mass index is 32.46 kg/(m^2).  General:  Alert and oriented, no acute distress HEENT: Normal Neck: No bruit or JVD Pulmonary: Clear to auscultation bilaterally Cardiac: Regular Rate and Rhythm without murmur Abdomen: Soft, non-tender, non-distended, no mass, no scars Skin: No rash, right neck well healed scar Extremity Pulses:  2+ radial, brachial, femoral, dorsalis pedis, posterior tibial pulses bilaterally Musculoskeletal: No deformity or edema  Neurologic: Upper and lower extremity motor  5/5 and symmetric, except the left UE 4+/5  DATA:   Carotid duplex  Right ICA stenosis 40-59%  Left 60-79%  ASSESSMENT:   Carotid stenosis s/p right CEA 08/08/2014 Continuing to make some recovery from recent right brain stroke post carotid endarterectomy. Depression  PLAN:   Repeat carotid duplex in Sept 2017 She will see her primary care PCP and discuss her depression and possible treatment plan.    Theda Sers EMMA [facility] PA-C [facility] Office: [phone]  The patient was seen in conjunction with Dr. Oneida Alar today  History and exam details as above. Patient continues to recover slowly from her perioperative stroke. She has had some return of function on the left side. She still has some mild mood disorder issues which she is working through with her primary care physician. Left upper extremity still with some weakness.  Carotid duplex exam today shows 40-60% right internal carotid artery stenosis with 60-80% left internal carotid artery stenosis exam was somewhat limited by patient movement.  Follow-up carotid duplex in September and we will try coordinate this office visit with her husband's duplex whom I also followed for carotid occlusive disease.  Ruta Hinds, MD [facility] Office: [phone] Pager: [phone]

## 2015-03-01 NOTE — Progress Notes (Signed)
STROKE NEUROLOGY FOLLOW UP NOTE  NAME: [name] DOB: 11/17/1940  REASON FOR VISIT: stroke follow up HISTORY FROM: pt and chart  Today we had the pleasure of seeing [name] in follow-up at our Neurology Clinic. Pt was accompanied by husband.   History Summary [name] is a 75 yo F with hx of CAD, HTN, current smoker, and carotid stenosis admitted on7/19/16 for right CEA. However, she developed left-sided weakness post procedure. Angio and CUS showed right ICA patent, likely the clot has moved distally and caused infarction. MRI showed right MCA territory infarcts. 2D negative. LDL 40 and A1c 6.2. Continued aspirin 162 and put on statin . Pt was going to quit smoking. She was discharged to 7  10/23/14 follow up - the patient has been doing well. No recurrent stroke symptoms, left arm and leg muscle strengths much improved. However, still has left-sided tingling and numbness, and developed left shoulder pain. She had followed with Dr. Oneida Alar and will repeat carotid Doppler in 02/2015. She is also going to see Dr. Elnita Maxwell in 10 days. BP 109/58 in clinic today. She was admitted in 08/2014 for partial SBO, no surgery, and went conservative management. Symptoms gradually resolved.  Interval History During the interval time, pt has been doing well. LUE muscle strength improved. Left shoulder pain resolved after injection by Dr. Letta Pate. Will follow up with Dr. Oneida Alar this afternoon. BP 130/66 today. Has not quit smoking yet. Complains of LBP which is chronic.  REVIEW OF SYSTEMS: Full 14 system review of systems performed and notable only for those listed below and in HPI above, all others are negative:  Constitutional:  fatigue Cardiovascular:  Ear/Nose/Throat:  Hearing loss Skin: itching Eyes:   Respiratory:   Gastroitestinal:   Genitourinary:  Hematology/Lymphatic:   Endocrine: cold intolerance Musculoskeletal:  Cold legs, neck pain, neck  stiffness Allergy/Immunology:   Neurological:  dizziness Psychiatric: Depression Sleep: daytime sleepiness  The following represents the patient's updated allergies and side effects list: Allergies  Allergen Reactions  . Pregabalin Swelling    Tongue swelling  . Sulfonamide Derivatives Swelling    Childhood reaction    The neurologically relevant items on the patient's problem list were reviewed on today's visit.  Neurologic Examination  A problem focused neurological exam (12 or more points of the single system neurologic examination, vital signs counts as 1 point, cranial nerves count for 8 points) was performed.  Blood pressure 130/66, pulse 78, weight 168 lb 12.8 oz (76.567 kg).  General - Well nourished, well developed, in no apparent distress.  Ophthalmologic - Sharp disc margins OU.  Cardiovascular - Regular rate and rhythm with no murmur.  Mental Status -  Level of arousal and orientation to time, place, and person were intact. Language including expression, naming, repetition, comprehension was assessed and found intact. Fund of Knowledge was assessed and was intact.  Cranial Nerves II - XII - II - Visual field intact OU. III, IV, VI - Extraocular movements intact. V - Facial sensation symmetrical VII - Facial movement intact bilaterally. VIII - Hearing & vestibular intact bilaterally. X - Palate elevates symmetrically. XI - Chin turning & shoulder shrug intact bilaterally. XII - Tongue protrusion intact.  Motor Strength - The patient's strength was 4/5 LUE proximal, 4/5 bicep and 5-/5 tricep, 5-/5 hand grip, LLE proximal 4+/5, distal 5/5. Bulk was normal and fasciculations were absent.   Motor Tone - Muscle tone was assessed at the neck and appendages and was normal.  Reflexes - The patient's reflexes were 1+ in all extremities and she had no pathological reflexes.  Sensory - Light touch, temperature/pinprick were assessed and were decreased on the left, 50%  of right.  Coordination - The patient had normal movements in the hands and feet with no ataxia or dysmetria.  Tremor was absent.  Gait and Station - mild left hemiparesis gait.  Data reviewed: I personally reviewed the images and agree with the radiology interpretations.  Ct Head Wo Contrast 08/08/2014 1. Possible acute nonhemorrhagic stroke in the posterior right middle cerebral artery distribution involving the right parietal lobe near the vertex. MRI with diffusion imaging may be confirmatory. 2. No acute intracranial abnormality otherwise.   MRI 08/09/2014 Acute right MCA territory infarcts, predominantly involving the parietal and posterior frontal lobes.  CUS 08/08/14 The right internal carotid artery is patent. There is no flap or significant plaque morphology seen within the visualized portion of the right internal carotid artery. There is evidence of elevated distal right internal carotid artery velocities measuring 204/64 at its highest. The right vertebral artery is patent with antegrade flow.  Cerebral angio  1.Wide patency of Rt end endarterectomy site . 2. No gross filling defects or occlusions noted  2D ehco  - Left ventricle: The cavity size was normal. There was mildconcentric hypertrophy. Systolic function was vigorous. Theestimated ejection fraction was in the range of 65% to 70%. Wallmotion was normal; there were no regional wall motionabnormalities. Doppler parameters are consistent with elevatedventricular end-diastolic filling pressure. - Aortic valve: Trileaflet; normal thickness leaflets. There was noregurgitation. - Aortic root: The aortic root was normal in size. - Left atrium: The atrium was mildly dilated. - Right ventricle: Systolic function was normal. - Right atrium: The atrium was normal in size. - Tricuspid valve: There was trivial regurgitation. - Pulmonic valve: There was no regurgitation. - Pulmonary arteries: Systolic pressure was within  the normalrange. - Inferior vena cava: The vessel was normal in size. - Pericardium, extracardiac: There was no pericardial effusion.  Component     Latest Ref Rng 08/09/2014 08/30/2014  Cholesterol     0 - 200 mg/dL 95   Triglycerides     <150 mg/dL 97   HDL Cholesterol     >40 mg/dL 36 (L)   Total CHOL/HDL Ratio      2.6   VLDL     0 - 40 mg/dL 19   LDL (calc)     0 - 99 mg/dL 40   Hemoglobin A1C     4.8 - 5.6 % 6.2 (H) 6.2 (H)  Mean Plasma Glucose      131 131  TSH     0.350 - 4.500 uIU/mL 0.690 2.724  Free T4     0.61 - 1.12 ng/dL 1.00     Assessment: As you may recall, she is a 75 y.o. Caucasian female with PMH of CAD, HTN, ongoing smoker, and carotid stenosis admitted on7/19/16 for right CEA. However, she developed left-sided weakness post procedure. Angio and CUS showed right ICA patent, likely related to procedure and the clot has moved distally and caused infarction. MRI showed right MCA territory infarcts. 2D negative. LDL 40 and A1c 6.2. Continued aspirin 162 and put on statin. Pt has not quit smoking yet. Finished outpatient PT/OT. Left hemiparesis improved. Follow up with Dr. Oneida Alar and Dr. Read Drivers.  Plan:  - continue ASA and lipitor for stroke prevention - follow up with Dr. Read Drivers and Dr. Oneida Alar as scheduled. - Follow up with  your primary care physician for stroke risk factor modification. Recommend maintain blood pressure goal around 130/80, diabetes with hemoglobin A1c goal below 6.5% and lipids with LDL cholesterol goal below 70 mg/dL.  - check BP at home - healthy diet and home exercise - quit smoking - follow up in 6 months  I spent more than 25 minutes of face to face time with the patient. Greater than 50% of time was spent in counseling and coordination of care. Discussed about smoking cessation, continued home exercise and following with other physicians.   No orders of the defined types were placed in this encounter.    No orders of the defined  types were placed in this encounter.    Patient Instructions  - continue ASA and lipitor for stroke prevention - follow up with Dr. Read Drivers and Dr. Oneida Alar as scheduled. - Follow up with your primary care physician for stroke risk factor modification. Recommend maintain blood pressure goal around 130/80, diabetes with hemoglobin A1c goal below 6.5% and lipids with LDL cholesterol goal below 70 mg/dL.  - check BP at home - healthy diet and home exercise - quit smoking - follow up in 6 months    Rosalin Hawking, MD PhD [facility] [address], [place], [place] [address] [phone]  a

## 2015-03-01 NOTE — Patient Instructions (Signed)
-   continue ASA and lipitor for stroke prevention - follow up with Dr. Read Drivers and Dr. Oneida Alar as scheduled. - Follow up with your primary care physician for stroke risk factor modification. Recommend maintain blood pressure goal around 130/80, diabetes with hemoglobin A1c goal below 6.5% and lipids with LDL cholesterol goal below 70 mg/dL.  - check BP at home - healthy diet and home exercise - quit smoking - follow up in 6 months

## 2015-03-02 ENCOUNTER — Ambulatory Visit (INDEPENDENT_AMBULATORY_CARE_PROVIDER_SITE_OTHER): Payer: Medicare Other | Admitting: Cardiology

## 2015-03-02 ENCOUNTER — Encounter: Payer: Self-pay | Admitting: Cardiology

## 2015-03-02 VITALS — BP 116/60 | HR 80 | Ht 60.0 in | Wt 168.0 lb

## 2015-03-02 DIAGNOSIS — I2583 Coronary atherosclerosis due to lipid rich plaque: Secondary | ICD-10-CM

## 2015-03-02 DIAGNOSIS — Z720 Tobacco use: Secondary | ICD-10-CM

## 2015-03-02 DIAGNOSIS — I739 Peripheral vascular disease, unspecified: Secondary | ICD-10-CM | POA: Diagnosis not present

## 2015-03-02 DIAGNOSIS — I2510 Atherosclerotic heart disease of native coronary artery without angina pectoris: Secondary | ICD-10-CM | POA: Diagnosis not present

## 2015-03-02 DIAGNOSIS — Z716 Tobacco abuse counseling: Secondary | ICD-10-CM | POA: Diagnosis not present

## 2015-03-02 DIAGNOSIS — F329 Major depressive disorder, single episode, unspecified: Secondary | ICD-10-CM

## 2015-03-02 DIAGNOSIS — F32A Depression, unspecified: Secondary | ICD-10-CM

## 2015-03-02 DIAGNOSIS — I10 Essential (primary) hypertension: Secondary | ICD-10-CM

## 2015-03-02 DIAGNOSIS — I5032 Chronic diastolic (congestive) heart failure: Secondary | ICD-10-CM

## 2015-03-02 MED ORDER — BUPROPION HCL ER (XL) 150 MG PO TB24: 150.0000 mg | ORAL_TABLET | Freq: Every day | ORAL | Status: DC

## 2015-03-02 NOTE — Patient Instructions (Signed)
Medication Instructions:  Your physician has recommended you make the following change in your medication:  1. Start Bupropion XL ( 150 mg ) daily, sent in today to patients preferred pharmacy   Labwork: -None  Testing/Procedures: Your physician has requested that you have a lower extremity arterial exercise duplex. During this test, exercise and ultrasound are used to evaluate arterial blood flow in the legs. Allow one hour for this exam. There are no restrictions or special instructions.   Follow-Up: Your physician recommends that you keep your scheduled  follow-up appointment with Dr. Meda Coffee   Any Other Special Instructions Will Be Listed Below (If Applicable).     If you need a refill on your cardiac medications before your next appointment, please call your pharmacy.

## 2015-03-02 NOTE — Addendum Note (Signed)
Addended by: Dorthula Rue L on: 03/02/2015 11:24 AM   Modules accepted: Orders

## 2015-03-02 NOTE — Progress Notes (Signed)
Patient ID: MCKENZE SLONE, female   DOB: 1940-08-10, 75 y.o.   MRN: [id number]    Patient Name: [name] Date of Encounter: 03/02/2015  Primary Care Provider:  [name], MD Primary Cardiologist:  Dorothy Spark  Problem List   Past Medical History  Diagnosis Date  . Carotid artery disease ([place])     a. 60-70% bilat ICA stenosis by dopplers 05/2012.  Marland Kitchen Other and unspecified hyperlipidemia     takes Lipitor daily  . CAD (coronary artery disease)     a. Mod dz 2010 initially mgd medically. b. 12/2012 - angina s/p PTCA/DES to mid-circumflex, PTCA/DES to first OM.   Marland Kitchen Barrett's esophagus   . SBO (small bowel obstruction) ([place])     a. Lysis of adhesions and ovarian cystectomy 04/2013 for sBO. b. Admitted 07/2013 for colonic obstruction due to suspected colitis, s/p partial colectomy/colostomy 08/01/13. Admission complicated by anasarca, acute resp failure requiring tracheostomy, decannulated 08/25/13. c. Recurrent SBO8/2015, NGT placed for decompression.   . Colonic obstruction due to suspected colitis; s/p colectomy/colostomy     a. See SBO.  Marland Kitchen Protein-calorie malnutrition, severe w/ electrolyte imbalance     a. Severe hypoalbuminemia leading to 3rd spacing including anasarca and pulm edema 07/2013.  Marland Kitchen DVT of axillary vein, acute right     a. Dx 07/2013 felt due to R IJ central line that had been inserted 7/14. Not on anticoag due to GIB issues and small cerebral bleed.  . Acute respiratory failure with hypoxia s/p tracheostomy 07/2013  . Intracranial hemorrhage ([place])     a. 08/11/13 - per DC summary, tiny SAH vs SDH done for altered mentation, anticoag stopped including aspirin.  . Anemia     a. 7-08/2013 felt due to recent critical illness/chronic disease.  . Complication of anesthesia     hard to wake up from anesthesia   . Hypertension     takes Metoprolol daily  . HTN (hypertension)   . Heart murmur   . COPD (chronic obstructive pulmonary disease) ([place])   .  Pneumonia     hx of->15 yrs ago  . History of bronchitis     > 65yr ago   . Dizziness     was on Bentyl which caused this-took off of it and no problems since  . Stroke ([facility] early 80's    right sided weakness--TIA  . Joint pain   . Chronic back pain     deteriorating;DDD  . Neck pain     DDD  . History of hiatal hernia   . History of colon polyps   . History of kidney stones     was told it was stable but doesn't know for sure that she ever passed it  . Depression     takes Remeron and Zoloft daily  . GERD (gastroesophageal reflux disease)   . Arthritis     handds, knees & back    Past Surgical History  Procedure Laterality Date  . Appendectomy    . Cholecystectomy    . Colon resection    . 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 Rotator cuff repair Left   . Abdominal hysterectomy    . Colon surgery    . Foot surgery Left   . Hand surgery Bilateral     for ganglion cysts  . Laparotomy N/A 08/01/2013    Procedure: EXPLORATORY LAPAROTOMY;  Surgeon: Harl Bowie, MD;  Location: [place];  Service: General;  Laterality: N/A;  . Partial colectomy N/A 08/01/2013    Procedure: PARTIAL COLECTOMY;  Surgeon: Harl Bowie, MD;  Location: [place];  Service: General;  Laterality: N/A;  . Colostomy N/A 08/01/2013    Procedure: COLOSTOMY;  Surgeon: Harl Bowie, MD;  Location: [place];  Service: General;  Laterality: N/A;  . Bowel resection N/A 08/01/2013    Procedure: SMALL BOWEL RESECTION;  Surgeon: Harl Bowie, MD;  Location: [place];  Service: General;  Laterality: N/A;  . Tracheostomy      feinstein  . Left heart catheterization with coronary angiogram N/A 12/20/2012    Procedure: LEFT HEART CATHETERIZATION WITH CORONARY ANGIOGRAM;  Surgeon: Blane Ohara, MD;  Location: [facility] CATH LAB;  Service: Cardiovascular;  Laterality: N/A;  . Cataract surgery Bilateral   . Colostomy takedown  01/24/2014    dr Ninfa Linden  .  Colostomy takedown N/A 01/24/2014    Procedure: COLOSTOMY TAKEDOWN;  Surgeon: Coralie Keens, MD;  Location: [place];  Service: General;  Laterality: N/A;  . Tonsillectomy    . Endarterectomy Right 08/08/2014    Procedure: RIGHT CAROTID ENDARTERECTOMY WITH HEMASHIELD PATCH ANGIOPLASTY;  Surgeon: Elam Dutch, MD;  Location: [place];  Service: Vascular;  Laterality: Right;    Allergies  Allergies  Allergen Reactions  . Pregabalin Swelling    Tongue swelling  . Sulfonamide Derivatives Swelling    Childhood reaction    HPI  Mrs Livingood was previously followed by Dr Verl Blalock. She is a pleasant 75 year old female with h/o hyperlipidemia, hypertension, PAD and CAD who returns today for a 1 year follow up. The patient is a lifelong smoker.  She had worsening exertional CP and DOE in November 2014, underwent a cardiac cath on 12/20/2012 with finding of severe stenosis in OM1 and received 2 DES. She was doing well from cardiac standpoint. In May 2015 had a SBO and required partial colon resection and now has a colostomy bag, lost 30 lbs.   09/01/14 - the patient had a rough month, she underwent right carotid endarterectomy on [id number]/8295 that was complicated by postop stroke in the recovery room. This resulted in a left hemiplegia. She still has some mild left facial droop and weakness in her left arm, she is able to walk. She was also admitted on 08/29/14 with a small bowel obstruction that resolved and no surgery was needed. She denies CP, SOB, orthopnea or PND, no LE edema.   03/02/2015 - the patient is coming after 6 months, she continues to recover from her stroke, however still has some left-sided weakness, she is generally able to walk but needs some assistance with showering. She denies any chest pain or shortness of breath. She complains of her feet being constantly cold and painful while walking. She also has difficulty to stop smoking. Denies any lower extremity edema.    Home  Medications  Prior to Admission medications   Medication Sig Start Date End Date Taking? Authorizing Provider  aspirin 81 MG tablet Take 81 mg by mouth 2 (two) times daily.    Yes Historical Provider, MD  atorvastatin (LIPITOR) 80 MG tablet Take 80 mg by mouth daily.   Yes Historical Provider, MD  dicyclomine (BENTYL) 10 MG capsule as directed. 08/20/10  Yes Historical Provider, MD  diphenhydramine-acetaminophen (TYLENOL PM) 25-500 MG TABS Take 1 tablet by mouth at bedtime as needed.     Yes Historical Provider, MD  fish oil-omega-3 fatty acids 1000 MG capsule Take 1 g by mouth daily.     Yes Historical Provider, MD  folic acid (FOLVITE) 629 MCG tablet Take 400 mcg by mouth daily.     Yes Historical Provider, MD  metoprolol succinate (TOPROL-XL) 25 MG 24 hr tablet TAKE 1 TABLET ONCE DAILY. 05/04/12  Yes Renella Cunas, MD  nitroGLYCERIN (NITROSTAT) 0.4 MG SL tablet Place 0.4 mg under the tongue every 5 (five) minutes as needed.     Yes Historical Provider, MD  omeprazole (PRILOSEC) 20 MG capsule Take 20 mg by mouth 2 (two) times daily.     Yes Historical Provider, MD  sertraline (ZOLOFT) 100 MG tablet Take 150 mg by mouth daily.    Yes Historical Provider, MD    Family History  Family History  Problem Relation Age of Onset  . Heart disease    . Varicose Veins Mother   . Heart disease Father   . Heart attack Father   . AAA (abdominal aortic aneurysm) Father     Social History  Social History   Social History  . Marital Status: Married    Spouse Name: N/A  . Number of Children: N/A  . Years of Education: N/A   Occupational History  . full time    Social History Main Topics  . Smoking status: Current Some Day Smoker -- 0.50 packs/day for 53 years    Types: Cigarettes  . Smokeless tobacco: Never Used     Comment: also uses e cig, 4 A DAY  . Alcohol Use: No  . Drug Use: No  . Sexual Activity: Not Currently    Birth Control/ Protection: Surgical   Other Topics Concern  . Not  on file   Social History Narrative     Review of Systems, as per HPI, otherwise negative General:  No chills, fever, night sweats or weight changes.  Cardiovascular:  No chest pain, dyspnea on exertion, edema, orthopnea, palpitations, paroxysmal nocturnal dyspnea. Dermatological: No rash, lesions/masses Respiratory: No cough, dyspnea Urologic: No hematuria, dysuria Abdominal:   No nausea, vomiting, diarrhea, bright red blood per rectum, melena, or hematemesis Neurologic:  No visual changes, wkns, changes in mental status. All other systems reviewed and are otherwise negative except as noted above.  Physical Exam  Blood pressure 116/60, pulse 80, height 5' (1.524 m), weight 168 lb (76.204 kg).  General: Pleasant, NAD Psych: Normal affect. Neuro: Alert and oriented X 3. Moves all extremities spontaneously. HEENT: Normal  Neck: Supple without bruits or JVD. Well healed scar post right carotid endarterectomy. Lungs:  Resp regular and unlabored, CTA. Heart: RRR no s3, s4, or murmurs. Abdomen: Soft, non-tender, non-distended, BS + x 4.  Extremities: No clubbing, cyanosis or edema. DP/PT weak B/L, Radials 2+ and equal bilaterally.  Labs:  No results for input(s): CKTOTAL, CKMB, TROPONINI in the last 72 hours. Lab Results  Component Value Date   WBC 6.7 01/23/2015   HGB 10.5* 01/23/2015   HCT 34.6* 01/23/2015   MCV 77.2* 01/23/2015   PLT 273 01/23/2015    Lab Results  Component Value Date   CHOL 95 08/09/2014   HDL 36* 08/09/2014   LDLCALC 40 08/09/2014   TRIG 97 08/09/2014   Lab Results  Component Value Date   DDIMER 2.78* 10/20/2013   Invalid input(s): POCBNP  Accessory Clinical Findings  Echocardiogram - 08/03/2013 Study Conclusions  - Left ventricle: The cavity size was normal. Systolic function was normal. Wall motion was normal; there were  no regional wall motion abnormalities. - Left atrium: The atrium was severely dilated.  ECG - SR, normal ECG  Cardiac  cath 12/20/2012   Lexiscan nuclear stress test: 11/04/2013 Quantitative Gated Spect Images  QGS EDV: 70 ml  QGS ESV: 22 ml  Impression  Exercise Capacity: Lexiscan with no exercise.  BP Response: Normal blood pressure response.  Clinical Symptoms: Chest tightness  ECG Impression: No significant ST segment change suggestive of ischemia.  Comparison with Prior Nuclear Study: Study is compared with the report of the study from October, 2009  Overall Impression: Normal stress nuclear study. No scar or ischemia. Low risk scan. No change when compared to the report of October, 2009  LV Ejection Fraction: 69%. LV Wall Motion: Normal Wall Motion.  03/2014  Carotid US Heterogeneous plaque, bilaterally. Essentially stable 60-79% bilateral ICA stenosis.(LICA high end of range) Elevated left subclavian artery velocities. Patent vertebral arteries with antegrade flow  08/09/2014 Left ventricle: The cavity size was normal. There was mild concentric hypertrophy. Systolic function was vigorous. The estimated ejection fraction was in the range of 65% to 70%. Wall motion was normal; there were no regional wall motion abnormalities. Doppler parameters are consistent with elevated ventricular end-diastolic filling pressure. - Aortic valve: Trileaflet; normal thickness leaflets. There was no regurgitation. - Aortic root: The aortic root was normal in size. - Left atrium: The atrium was mildly dilated. - Right ventricle: Systolic function was normal. - Right atrium: The atrium was normal in size. - Tricuspid valve: There was trivial regurgitation. - Pulmonic valve: There was no regurgitation. - Pulmonary arteries: Systolic pressure was within the normal range. - Inferior vena cava: The vessel was normal in size. - Pericardium, extracardiac: There was no pericardial effusion.   Assessment & Plan   75 year old female with multiple risk factors for CAD including lifelong smoking, known  B/L carotid disease, HTN and hyperlipidemia  1. Claudications - weak peripheral pulses in the posterior tibialis and dorsalis pedis bilaterally, he'll schedule bilateral lower extremity duplex.  2. Depression, difficulty to stop smoking - we will add bupropion XL 150 mg daily this is a low dose, QT QTc is normal we will recheck the next visit 2 months from now.  3. CAD- stable - Severe left circumflex and OM disease treated successfully with PCI x 2 on 12/20/2012. DAPT d/c-ed in 12/15. Negative stress test on 11/07/2013.  Continue ASA and atorvastatin, metoprolol.  4. Chronic diastolic CHF - continue holding lasix, TTE on 08/09/14 shows elevated LVEDP, however appears euvolemic.   5. Hypertension - controlled   6. Hyperlipidemia - managed by PCP, on high dose of atorvastatin 80 mg PO QHS, normal LFTs.  7. Carotid disease - s/p right carotid endarterectomy - complicated by stroke. Followed by Dr Oneida Alar.  Follow up in 2 months.  Dorothy Spark, MD, [facility] 03/02/2015, 3:15 PM

## 2015-03-05 ENCOUNTER — Other Ambulatory Visit: Payer: Self-pay | Admitting: Cardiology

## 2015-03-05 DIAGNOSIS — R0989 Other specified symptoms and signs involving the circulatory and respiratory systems: Secondary | ICD-10-CM

## 2015-03-06 ENCOUNTER — Inpatient Hospital Stay (HOSPITAL_COMMUNITY): Admission: RE | Admit: 2015-03-06 | Payer: Medicare Other | Source: Ambulatory Visit

## 2015-03-09 ENCOUNTER — Ambulatory Visit (HOSPITAL_COMMUNITY)
Admission: RE | Admit: 2015-03-09 | Discharge: 2015-03-09 | Disposition: A | Discharge status: Home / Self Care | Payer: Medicare Other | Source: Ambulatory Visit | Attending: Cardiology | Admitting: Cardiology

## 2015-03-09 DIAGNOSIS — R938 Abnormal findings on diagnostic imaging of other specified body structures: Secondary | ICD-10-CM | POA: Diagnosis not present

## 2015-03-09 DIAGNOSIS — E785 Hyperlipidemia, unspecified: Secondary | ICD-10-CM | POA: Insufficient documentation

## 2015-03-09 DIAGNOSIS — I739 Peripheral vascular disease, unspecified: Secondary | ICD-10-CM | POA: Insufficient documentation

## 2015-03-09 DIAGNOSIS — I70202 Unspecified atherosclerosis of native arteries of extremities, left leg: Secondary | ICD-10-CM | POA: Insufficient documentation

## 2015-03-09 DIAGNOSIS — I10 Essential (primary) hypertension: Secondary | ICD-10-CM | POA: Diagnosis not present

## 2015-03-09 DIAGNOSIS — R0989 Other specified symptoms and signs involving the circulatory and respiratory systems: Secondary | ICD-10-CM

## 2015-03-14 DIAGNOSIS — M1990 Unspecified osteoarthritis, unspecified site: Secondary | ICD-10-CM | POA: Diagnosis not present

## 2015-03-14 DIAGNOSIS — I10 Essential (primary) hypertension: Secondary | ICD-10-CM | POA: Diagnosis not present

## 2015-03-14 DIAGNOSIS — R739 Hyperglycemia, unspecified: Secondary | ICD-10-CM | POA: Diagnosis not present

## 2015-03-14 DIAGNOSIS — E785 Hyperlipidemia, unspecified: Secondary | ICD-10-CM | POA: Diagnosis not present

## 2015-03-14 DIAGNOSIS — J449 Chronic obstructive pulmonary disease, unspecified: Secondary | ICD-10-CM | POA: Diagnosis not present

## 2015-03-20 ENCOUNTER — Ambulatory Visit: Payer: Medicare Other | Admitting: Cardiovascular Disease

## 2015-03-27 ENCOUNTER — Ambulatory Visit (INDEPENDENT_AMBULATORY_CARE_PROVIDER_SITE_OTHER): Payer: Medicare Other | Admitting: Cardiovascular Disease

## 2015-03-27 ENCOUNTER — Encounter: Payer: Self-pay | Admitting: Cardiovascular Disease

## 2015-03-27 VITALS — BP 132/68 | HR 82 | Ht 60.0 in | Wt 170.0 lb

## 2015-03-27 DIAGNOSIS — I739 Peripheral vascular disease, unspecified: Secondary | ICD-10-CM | POA: Diagnosis not present

## 2015-03-27 NOTE — Progress Notes (Signed)
Cardiology Office Note   Date:  03/27/2015   ID:  [name], DOB 06-17-1940, MRN [id number]  PCP:  [name], MD  Cardiologist:  Dr. Meda Coffee  No chief complaint on file.     History of Present Illness: [name] is a 75 y.o. female who was referred by Dr. Meda Coffee for  peripheral arterial disease.  She has history of hyperlipidemia, hypertension, PAD and CAD status post stent placement to OM1 and 2014 .The patient is a lifelong smoker.  In May 2015 had a SBO and required partial colon resection and now has a colostomy bag, lost 30 lbs.   The patient had right carotid endarterectomy in 16/07 which was complicated by stroke which caused significant left sided weakness.  She was seen for bilateral calf pain with walking. She underwent noninvasive vascular studies which showed borderline ABI bilaterally (0.9 range). Duplex showed borderline left SFA stenosis.  She continues to be limited by back pain . She still has left sided weakness mostly affecting the left arm. She is cutting down on smoking to half a pack per day.     Past Medical History  Diagnosis Date  . Carotid artery disease ([place])     a. 60-70% bilat ICA stenosis by dopplers 05/2012.  Marland Kitchen Other and unspecified hyperlipidemia     takes Lipitor daily  . CAD (coronary artery disease)     a. Mod dz 2010 initially mgd medically. b. 12/2012 - angina s/p PTCA/DES to mid-circumflex, PTCA/DES to first OM.   Marland Kitchen Barrett's esophagus   . SBO (small bowel obstruction) ([place])     a. Lysis of adhesions and ovarian cystectomy 04/2013 for sBO. b. Admitted 07/2013 for colonic obstruction due to suspected colitis, s/p partial colectomy/colostomy 08/01/13. Admission complicated by anasarca, acute resp failure requiring tracheostomy, decannulated 08/25/13. c. Recurrent SBO8/2015, NGT placed for decompression.   . Colonic obstruction due to suspected colitis; s/p colectomy/colostomy     a. See SBO.  Marland Kitchen Protein-calorie  malnutrition, severe w/ electrolyte imbalance     a. Severe hypoalbuminemia leading to 3rd spacing including anasarca and pulm edema 07/2013.  Marland Kitchen DVT of axillary vein, acute right     a. Dx 07/2013 felt due to R IJ central line that had been inserted 7/14. Not on anticoag due to GIB issues and small cerebral bleed.  . Acute respiratory failure with hypoxia s/p tracheostomy 07/2013  . Intracranial hemorrhage ([place])     a. 08/11/13 - per DC summary, tiny SAH vs SDH done for altered mentation, anticoag stopped including aspirin.  . Anemia     a. 7-08/2013 felt due to recent critical illness/chronic disease.  . Complication of anesthesia     hard to wake up from anesthesia   . Hypertension     takes Metoprolol daily  . HTN (hypertension)   . Heart murmur   . COPD (chronic obstructive pulmonary disease) ([place])   . Pneumonia     hx of->15 yrs ago  . History of bronchitis     > 33yr ago   . Dizziness     was on Bentyl which caused this-took off of it and no problems since  . Stroke ([facility] early 80's    right sided weakness--TIA  . Joint pain   . Chronic back pain     deteriorating;DDD  . Neck pain     DDD  . History of hiatal hernia   . History of colon polyps   . History of kidney  stones     was told it was stable but doesn't know for sure that she ever passed it  . Depression     takes Remeron and Zoloft daily  . GERD (gastroesophageal reflux disease)   . Arthritis     handds, knees & back     Past Surgical History  Procedure Laterality Date  . Appendectomy    . Cholecystectomy    . Colon resection    . 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 Rotator cuff repair Left   . Abdominal hysterectomy    . Colon surgery    . Foot surgery Left   . Hand surgery Bilateral     for ganglion cysts  . Laparotomy N/A 08/01/2013    Procedure: EXPLORATORY LAPAROTOMY;  Surgeon: Harl Bowie, MD;  Location: [place];  Service: General;   Laterality: N/A;  . Partial colectomy N/A 08/01/2013    Procedure: PARTIAL COLECTOMY;  Surgeon: Harl Bowie, MD;  Location: [place];  Service: General;  Laterality: N/A;  . Colostomy N/A 08/01/2013    Procedure: COLOSTOMY;  Surgeon: Harl Bowie, MD;  Location: [place];  Service: General;  Laterality: N/A;  . Bowel resection N/A 08/01/2013    Procedure: SMALL BOWEL RESECTION;  Surgeon: Harl Bowie, MD;  Location: [place];  Service: General;  Laterality: N/A;  . Tracheostomy      feinstein  . Left heart catheterization with coronary angiogram N/A 12/20/2012    Procedure: LEFT HEART CATHETERIZATION WITH CORONARY ANGIOGRAM;  Surgeon: Blane Ohara, MD;  Location: [facility] CATH LAB;  Service: Cardiovascular;  Laterality: N/A;  . Cataract surgery Bilateral   . Colostomy takedown  01/24/2014    dr Ninfa Linden  . Colostomy takedown N/A 01/24/2014    Procedure: COLOSTOMY TAKEDOWN;  Surgeon: Coralie Keens, MD;  Location: [place];  Service: General;  Laterality: N/A;  . Tonsillectomy    . Endarterectomy Right 08/08/2014    Procedure: RIGHT CAROTID ENDARTERECTOMY WITH HEMASHIELD PATCH ANGIOPLASTY;  Surgeon: Elam Dutch, MD;  Location: [place];  Service: Vascular;  Laterality: Right;     Current Outpatient Prescriptions  Medication Sig Dispense Refill  . aspirin EC 81 MG tablet Take 162 mg by mouth daily.    Marland Kitchen atorvastatin (LIPITOR) 80 MG tablet Take 80 mg by mouth every evening.     Marland Kitchen buPROPion (WELLBUTRIN XL) 150 MG 24 hr tablet Take 1 tablet (150 mg total) by mouth daily. 90 tablet 3  . docusate sodium (COLACE) 100 MG capsule Take 100 mg by mouth 2 (two) times daily.    . fish oil-omega-3 fatty acids 1000 MG capsule Take 1,000 mg by mouth daily.     . folic acid (FOLVITE) 097 MCG tablet Take 800 mcg by mouth daily.     Marland Kitchen gabapentin (NEURONTIN) 300 MG capsule Take 300 mg by mouth 3 (three) times daily as needed (nerve pain).    . metoprolol succinate (TOPROL-XL) 25 MG 24 hr tablet Take 1  tablet (25 mg total) by mouth every morning. 90 tablet 0  . mirtazapine (REMERON) 30 MG tablet Take 1 tablet by mouth at bedtime.     . nitroGLYCERIN (NITROSTAT) 0.4 MG SL tablet Place 1 tablet (0.4 mg total) under the tongue every 5 (five) minutes as needed for chest pain. 25 tablet 3  . ondansetron (ZOFRAN) 4 MG tablet Take 4 mg by  mouth every 8 (eight) hours as needed for nausea or vomiting.     . sertraline (ZOLOFT) 100 MG tablet Take 100 mg by mouth 2 (two) times daily.     No current facility-administered medications for this visit.    Allergies:   Pregabalin and Sulfonamide derivatives    Social History:  The patient  reports that she has been smoking Cigarettes.  She has a 26.5 pack-year smoking history. She has never used smokeless tobacco. She reports that she does not drink alcohol or use illicit drugs.   Family History:  The patient's family history includes AAA (abdominal aortic aneurysm) in her father; Heart attack in her father; Heart disease in her father; Varicose Veins in her mother.    ROS:  Please see the history of present illness.   Otherwise, review of systems are positive for none.   All other systems are reviewed and negative.    PHYSICAL EXAM: VS:  Ht 5' (1.524 m)  Wt 170 lb (77.111 kg)  BMI 33.20 kg/m2 , BMI Body mass index is 33.2 kg/(m^2). GEN: Well nourished, well developed, in no acute distress HEENT: normal Neck: no JVD, carotid bruits, or masses Cardiac: RRR; no murmurs, rubs, or gallops,no edema  Respiratory:  clear to auscultation bilaterally, normal work of breathing GI: soft, nontender, nondistended, + BS MS: no deformity or atrophy Skin: warm and dry, no rash Neuro:  Strength and sensation are intact Psych: euthymic mood, full affect   EKG:  EKG is not ordered today.   Recent Labs: 08/30/2014: TSH 2.724 01/23/2015: ALT 17; BUN 12; Creatinine, Ser 0.46; Hemoglobin 10.5*; Platelets 273; Potassium 4.1; Sodium 141    Lipid Panel      Component Value Date/Time   CHOL 95 08/09/2014 0400   TRIG 97 08/09/2014 0400   HDL 36* 08/09/2014 0400   CHOLHDL 2.6 08/09/2014 0400   VLDL 19 08/09/2014 0400   LDLCALC 40 08/09/2014 0400      Wt Readings from Last 3 Encounters:  03/27/15 170 lb (77.111 kg)  03/02/15 168 lb (76.204 kg)  03/01/15 166 lb 3.2 oz (75.388 kg)         ASSESSMENT AND PLAN:  1.  Peripheral arterial disease: The patient has mild peripheral arterial disease overall and I suspect that her symptoms are multifactorial and most related to her back situation. Her peripheral arterial disease does not require revascularization and I recommend continuing medical therapy. Continue treatment with aspirin and atorvastatin.  2. Tobacco use: I advised her to quit smoking and explained to her the association with peripheral arterial disease.  3. Coronary artery disease: She has no symptoms suggestive of angina.  4. Hyperlipidemia: Continue treatment with atorvastatin. Most recent LDL was 40.      Disposition:   FU with me as needed.    Signed,  Kathlyn Sacramento, MD  03/27/2015 8:36 AM    [place]

## 2015-03-27 NOTE — Patient Instructions (Signed)
Medication Instructions:  Your physician recommends that you continue on your current medications as directed. Please refer to the Current Medication list given to you today.  Labwork: No new orders.   Testing/Procedures: No new orders.   Follow-Up: Your physician recommends that you schedule a follow-up appointment as needed with Dr Fletcher Anon.    Any Other Special Instructions Will Be Listed Below (If Applicable).     If you need a refill on your cardiac medications before your next appointment, please call your pharmacy.

## 2015-05-09 DIAGNOSIS — M1990 Unspecified osteoarthritis, unspecified site: Secondary | ICD-10-CM | POA: Diagnosis not present

## 2015-05-09 DIAGNOSIS — D539 Nutritional anemia, unspecified: Secondary | ICD-10-CM | POA: Diagnosis not present

## 2015-05-09 DIAGNOSIS — R739 Hyperglycemia, unspecified: Secondary | ICD-10-CM | POA: Diagnosis not present

## 2015-05-09 DIAGNOSIS — I10 Essential (primary) hypertension: Secondary | ICD-10-CM | POA: Diagnosis not present

## 2015-05-09 DIAGNOSIS — E669 Obesity, unspecified: Secondary | ICD-10-CM | POA: Diagnosis not present

## 2015-05-09 DIAGNOSIS — E785 Hyperlipidemia, unspecified: Secondary | ICD-10-CM | POA: Diagnosis not present

## 2015-05-14 ENCOUNTER — Ambulatory Visit: Payer: Medicare Other | Admitting: Cardiology

## 2015-06-13 ENCOUNTER — Encounter: Payer: Self-pay | Admitting: Cardiology

## 2015-06-13 ENCOUNTER — Ambulatory Visit (INDEPENDENT_AMBULATORY_CARE_PROVIDER_SITE_OTHER): Payer: Medicare Other | Admitting: Cardiology

## 2015-06-13 VITALS — BP 132/68 | HR 88 | Ht 60.0 in | Wt 172.0 lb

## 2015-06-13 DIAGNOSIS — I2510 Atherosclerotic heart disease of native coronary artery without angina pectoris: Secondary | ICD-10-CM

## 2015-06-13 DIAGNOSIS — I6523 Occlusion and stenosis of bilateral carotid arteries: Secondary | ICD-10-CM

## 2015-06-13 DIAGNOSIS — I739 Peripheral vascular disease, unspecified: Secondary | ICD-10-CM

## 2015-06-13 DIAGNOSIS — I10 Essential (primary) hypertension: Secondary | ICD-10-CM | POA: Diagnosis not present

## 2015-06-13 DIAGNOSIS — I5032 Chronic diastolic (congestive) heart failure: Secondary | ICD-10-CM

## 2015-06-13 DIAGNOSIS — E785 Hyperlipidemia, unspecified: Secondary | ICD-10-CM

## 2015-06-13 NOTE — Progress Notes (Signed)
Patient ID: [name], female   DOB: 14-Jan-1941, 75 y.o.   MRN: [id number]    Patient Name: [name] Date of Encounter: 06/13/2015  Primary Care Provider:  [name], MD Primary Cardiologist:  Ena Dawley  Problem List   Past Medical History  Diagnosis Date  . Carotid artery disease ([place])     a. 60-70% bilat ICA stenosis by dopplers 05/2012.  Marland Kitchen Other and unspecified hyperlipidemia     takes Lipitor daily  . CAD (coronary artery disease)     a. Mod dz 2010 initially mgd medically. b. 12/2012 - angina s/p PTCA/DES to mid-circumflex, PTCA/DES to first OM.   Marland Kitchen Barrett's esophagus   . SBO (small bowel obstruction) ([place])     a. Lysis of adhesions and ovarian cystectomy 04/2013 for sBO. b. Admitted 07/2013 for colonic obstruction due to suspected colitis, s/p partial colectomy/colostomy 08/01/13. Admission complicated by anasarca, acute resp failure requiring tracheostomy, decannulated 08/25/13. c. Recurrent SBO8/2015, NGT placed for decompression.   . Colonic obstruction due to suspected colitis; s/p colectomy/colostomy     a. See SBO.  Marland Kitchen Protein-calorie malnutrition, severe w/ electrolyte imbalance     a. Severe hypoalbuminemia leading to 3rd spacing including anasarca and pulm edema 07/2013.  Marland Kitchen DVT of axillary vein, acute right     a. Dx 07/2013 felt due to R IJ central line that had been inserted 7/14. Not on anticoag due to GIB issues and small cerebral bleed.  . Acute respiratory failure with hypoxia s/p tracheostomy 07/2013  . Intracranial hemorrhage ([place])     a. 08/11/13 - per DC summary, tiny SAH vs SDH done for altered mentation, anticoag stopped including aspirin.  . Anemia     a. 7-08/2013 felt due to recent critical illness/chronic disease.  . Complication of anesthesia     hard to wake up from anesthesia   . Hypertension     takes Metoprolol daily  . HTN (hypertension)   . Heart murmur   . COPD (chronic obstructive pulmonary disease) ([place])   .  Pneumonia     hx of->15 yrs ago  . History of bronchitis     > 18yr ago   . Dizziness     was on Bentyl which caused this-took off of it and no problems since  . Stroke ([facility] early 80's    right sided weakness--TIA  . Joint pain   . Chronic back pain     deteriorating;DDD  . Neck pain     DDD  . History of hiatal hernia   . History of colon polyps   . History of kidney stones     was told it was stable but doesn't know for sure that she ever passed it  . Depression     takes Remeron and Zoloft daily  . GERD (gastroesophageal reflux disease)   . Arthritis     handds, knees & back    Past Surgical History  Procedure Laterality Date  . Appendectomy    . Cholecystectomy    . Colon resection    . 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 Rotator cuff repair Left   . Abdominal hysterectomy    . Colon surgery    . Foot surgery Left   . Hand surgery Bilateral     for ganglion cysts  . Laparotomy N/A 08/01/2013    Procedure: EXPLORATORY LAPAROTOMY;  Surgeon: Harl Bowie, MD;  Location: [place];  Service: General;  Laterality: N/A;  . Partial colectomy N/A 08/01/2013    Procedure: PARTIAL COLECTOMY;  Surgeon: Harl Bowie, MD;  Location: [place];  Service: General;  Laterality: N/A;  . Colostomy N/A 08/01/2013    Procedure: COLOSTOMY;  Surgeon: Harl Bowie, MD;  Location: [place];  Service: General;  Laterality: N/A;  . Bowel resection N/A 08/01/2013    Procedure: SMALL BOWEL RESECTION;  Surgeon: Harl Bowie, MD;  Location: [place];  Service: General;  Laterality: N/A;  . Tracheostomy      feinstein  . Left heart catheterization with coronary angiogram N/A 12/20/2012    Procedure: LEFT HEART CATHETERIZATION WITH CORONARY ANGIOGRAM;  Surgeon: Blane Ohara, MD;  Location: [facility] CATH LAB;  Service: Cardiovascular;  Laterality: N/A;  . Cataract surgery Bilateral   . Colostomy takedown  01/24/2014    dr Ninfa Linden  .  Colostomy takedown N/A 01/24/2014    Procedure: COLOSTOMY TAKEDOWN;  Surgeon: Coralie Keens, MD;  Location: [place];  Service: General;  Laterality: N/A;  . Tonsillectomy    . Endarterectomy Right 08/08/2014    Procedure: RIGHT CAROTID ENDARTERECTOMY WITH HEMASHIELD PATCH ANGIOPLASTY;  Surgeon: Elam Dutch, MD;  Location: [place];  Service: Vascular;  Laterality: Right;    Allergies  Allergies  Allergen Reactions  . Pregabalin Swelling    Tongue swelling  . Sulfonamide Derivatives Swelling    Childhood reaction    HPI  [name] was previously followed by Dr Verl Blalock. She is a pleasant 75 year old female with h/o hyperlipidemia, hypertension, PAD and CAD who returns today for a 1 year follow up. The patient is a lifelong smoker.  She had worsening exertional CP and DOE in November 2014, underwent a cardiac cath on 12/20/2012 with finding of severe stenosis in OM1 and received 2 DES. She was doing well from cardiac standpoint. In May 2015 had a SBO and required partial colon resection and now has a colostomy bag, lost 30 lbs.   09/01/14 - the patient had a rough month, she underwent right carotid endarterectomy on 33/29/5188 that was complicated by postop stroke in the recovery room. This resulted in a left hemiplegia. She still has some mild left facial droop and weakness in her left arm, she is able to walk. She was also admitted on 08/29/14 with a small bowel obstruction that resolved and no surgery was needed. She denies CP, SOB, orthopnea or PND, no LE edema.   06/13/2015  - patient is coming after 3 months, she states that she cut down her smoking to minimally fifth cigarettes a day, she denies any chest shortness of breath no palpitations or syncope. She has no lower extremity edema or orthopnea. She is compliant with her meds and denies any side effects especially with atorvastatin. She saw Dr. Fletcher Anon concluded that she only has mild peripheral arterial disease continue just medical  therapy. She is minimally active.   Home Medications  Prior to Admission medications   Medication Sig Start Date End Date Taking? Authorizing Provider  aspirin 81 MG tablet Take 81 mg by mouth 2 (two) times daily.    Yes Historical Provider, MD  atorvastatin (LIPITOR) 80 MG tablet Take 80 mg by mouth daily.   Yes Historical Provider, MD  dicyclomine (BENTYL) 10 MG capsule as directed. 08/20/10  Yes Historical Provider, MD  diphenhydramine-acetaminophen (TYLENOL PM) 25-500 MG TABS Take 1 tablet by mouth at bedtime as  needed.     Yes Historical Provider, MD  fish oil-omega-3 fatty acids 1000 MG capsule Take 1 g by mouth daily.     Yes Historical Provider, MD  folic acid (FOLVITE) 314 MCG tablet Take 400 mcg by mouth daily.     Yes Historical Provider, MD  metoprolol succinate (TOPROL-XL) 25 MG 24 hr tablet TAKE 1 TABLET ONCE DAILY. 05/04/12  Yes Renella Cunas, MD  nitroGLYCERIN (NITROSTAT) 0.4 MG SL tablet Place 0.4 mg under the tongue every 5 (five) minutes as needed.     Yes Historical Provider, MD  omeprazole (PRILOSEC) 20 MG capsule Take 20 mg by mouth 2 (two) times daily.     Yes Historical Provider, MD  sertraline (ZOLOFT) 100 MG tablet Take 150 mg by mouth daily.    Yes Historical Provider, MD    Family History  Family History  Problem Relation Age of Onset  . Heart disease    . Varicose Veins Mother   . Heart disease Father   . Heart attack Father   . AAA (abdominal aortic aneurysm) Father     Social History  Social History   Social History  . Marital Status: Married    Spouse Name: N/A  . Number of Children: N/A  . Years of Education: N/A   Occupational History  . full time    Social History Main Topics  . Smoking status: Current Some Day Smoker -- 0.50 packs/day for 53 years    Types: Cigarettes  . Smokeless tobacco: Never Used     Comment: also uses e cig, 4 A DAY  . Alcohol Use: No  . Drug Use: No  . Sexual Activity: Not Currently    Birth Control/  Protection: Surgical   Other Topics Concern  . Not on file   Social History Narrative     Review of Systems, as per HPI, otherwise negative General:  No chills, fever, night sweats or weight changes.  Cardiovascular:  No chest pain, dyspnea on exertion, edema, orthopnea, palpitations, paroxysmal nocturnal dyspnea. Dermatological: No rash, lesions/masses Respiratory: No cough, dyspnea Urologic: No hematuria, dysuria Abdominal:   No nausea, vomiting, diarrhea, bright red blood per rectum, melena, or hematemesis Neurologic:  No visual changes, wkns, changes in mental status. All other systems reviewed and are otherwise negative except as noted above.  Physical Exam  Blood pressure 132/68, pulse 88, height 5' (1.524 m), weight 172 lb (78.019 kg).  General: Pleasant, NAD Psych: Normal affect. Neuro: Alert and oriented X 3. Moves all extremities spontaneously. HEENT: Normal  Neck: Supple without bruits or JVD. Well healed scar post right carotid endarterectomy. Lungs:  Resp regular and unlabored, CTA. Heart: RRR no s3, s4, or murmurs. Abdomen: Soft, non-tender, non-distended, BS + x 4.  Extremities: No clubbing, cyanosis or edema. DP/PT weak B/L, Radials 2+ and equal bilaterally.  Labs:  No results for input(s): CKTOTAL, CKMB, TROPONINI in the last 72 hours. Lab Results  Component Value Date   WBC 6.7 01/23/2015   HGB 10.5* 01/23/2015   HCT 34.6* 01/23/2015   MCV 77.2* 01/23/2015   PLT 273 01/23/2015    Lab Results  Component Value Date   CHOL 95 08/09/2014   HDL 36* 08/09/2014   LDLCALC 40 08/09/2014   TRIG 97 08/09/2014   Lab Results  Component Value Date   DDIMER 2.78* 10/20/2013   Invalid input(s): POCBNP  Accessory Clinical Findings  Echocardiogram - 08/03/2013 Study Conclusions  - Left ventricle: The cavity size was normal.  Systolic function was normal. Wall motion was normal; there were no regional wall motion abnormalities. - Left atrium: The atrium was  severely dilated.  ECG - SR, normal ECG  Cardiac cath 12/20/2012   Lexiscan nuclear stress test: 11/04/2013 Quantitative Gated Spect Images  QGS EDV: 70 ml  QGS ESV: 22 ml  Impression  Exercise Capacity: Lexiscan with no exercise.  BP Response: Normal blood pressure response.  Clinical Symptoms: Chest tightness  ECG Impression: No significant ST segment change suggestive of ischemia.  Comparison with Prior Nuclear Study: Study is compared with the report of the study from October, 2009  Overall Impression: Normal stress nuclear study. No scar or ischemia. Low risk scan. No change when compared to the report of October, 2009  LV Ejection Fraction: 69%. LV Wall Motion: Normal Wall Motion.  03/2014  Carotid US Heterogeneous plaque, bilaterally. Essentially stable 60-79% bilateral ICA stenosis.(LICA high end of range) Elevated left subclavian artery velocities. Patent vertebral arteries with antegrade flow  08/09/2014 Left ventricle: The cavity size was normal. There was mild concentric hypertrophy. Systolic function was vigorous. The estimated ejection fraction was in the range of 65% to 70%. Wall motion was normal; there were no regional wall motion abnormalities. Doppler parameters are consistent with elevated ventricular end-diastolic filling pressure. - Aortic valve: Trileaflet; normal thickness leaflets. There was no regurgitation. - Aortic root: The aortic root was normal in size. - Left atrium: The atrium was mildly dilated. - Right ventricle: Systolic function was normal. - Right atrium: The atrium was normal in size. - Tricuspid valve: There was trivial regurgitation. - Pulmonic valve: There was no regurgitation. - Pulmonary arteries: Systolic pressure was within the normal range. - Inferior vena cava: The vessel was normal in size. - Pericardium, extracardiac: There was no pericardial effusion.   Assessment & Plan   75 year old female with multiple  risk factors for CAD including lifelong smoking, known B/L carotid disease, HTN and hyperlipidemia  1. Claudications - she was seen by Dr. Fletcher Anon: the patient has mild peripheral arterial disease overall and I suspect that her symptoms are multifactorial and most related to her back situation. Her peripheral arterial disease does not require revascularization and I recommend continuing medical therapy. Continue treatment with aspirin and atorvastatin. - She is also strongly advised to quit smoking altogether. She is also encouraged to walk a little bit every day.  2. CAD- stable - Severe left circumflex and OM disease treated successfully with PCI x 2 on 12/20/2012. DAPT d/c-ed in 12/15. Negative stress test on 11/07/2013.  Continue ASA and atorvastatin, metoprolol. She is asymptomatic.  3. Chronic diastolic CHF - continue holding lasix, TTE on 08/09/14 shows elevated LVEDP, however appears euvolemic.   4. Hypertension - controlled, continue current management.   5. Hyperlipidemia - managed by PCP, on high dose of atorvastatin 80 mg PO QHS, normal LFTs. Her lipids checked a months ago and on call.  7. Carotid disease - s/p right carotid endarterectomy - complicated by stroke. Followed by Dr Oneida Alar. No bruit.  Follow up in 6 months.  Ena Dawley, MD, [facility] 06/13/2015, 12:20 PM

## 2015-06-13 NOTE — Patient Instructions (Signed)
Your physician recommends that you continue on your current medications as directed. Please refer to the Current Medication list given to you today.     Your physician wants you to follow-up in: 6 MONTHS WITH DR NELSON You will receive a reminder letter in the mail two months in advance. If you don't receive a letter, please call our office to schedule the follow-up appointment.  

## 2015-08-29 ENCOUNTER — Ambulatory Visit: Payer: Medicare Other | Admitting: Neurology

## 2015-09-13 DIAGNOSIS — I10 Essential (primary) hypertension: Secondary | ICD-10-CM | POA: Diagnosis not present

## 2015-09-13 DIAGNOSIS — D539 Nutritional anemia, unspecified: Secondary | ICD-10-CM | POA: Diagnosis not present

## 2015-09-13 DIAGNOSIS — J449 Chronic obstructive pulmonary disease, unspecified: Secondary | ICD-10-CM | POA: Diagnosis not present

## 2015-09-13 DIAGNOSIS — M1990 Unspecified osteoarthritis, unspecified site: Secondary | ICD-10-CM | POA: Diagnosis not present

## 2015-09-13 DIAGNOSIS — R739 Hyperglycemia, unspecified: Secondary | ICD-10-CM | POA: Diagnosis not present

## 2015-09-13 DIAGNOSIS — E785 Hyperlipidemia, unspecified: Secondary | ICD-10-CM | POA: Diagnosis not present

## 2015-09-28 ENCOUNTER — Encounter: Payer: Self-pay | Admitting: Family

## 2015-10-04 ENCOUNTER — Encounter: Payer: Self-pay | Admitting: Family

## 2015-10-04 ENCOUNTER — Ambulatory Visit (INDEPENDENT_AMBULATORY_CARE_PROVIDER_SITE_OTHER): Payer: Medicare Other | Admitting: Family

## 2015-10-04 ENCOUNTER — Ambulatory Visit (HOSPITAL_COMMUNITY)
Admission: RE | Admit: 2015-10-04 | Discharge: 2015-10-04 | Disposition: A | Discharge status: Home / Self Care | Payer: Medicare Other | Source: Ambulatory Visit | Attending: Family | Admitting: Family

## 2015-10-04 VITALS — BP 133/71 | HR 73 | Ht 60.0 in | Wt 176.0 lb

## 2015-10-04 DIAGNOSIS — I6523 Occlusion and stenosis of bilateral carotid arteries: Secondary | ICD-10-CM | POA: Diagnosis not present

## 2015-10-04 DIAGNOSIS — Z720 Tobacco use: Secondary | ICD-10-CM

## 2015-10-04 DIAGNOSIS — F17200 Nicotine dependence, unspecified, uncomplicated: Secondary | ICD-10-CM

## 2015-10-04 DIAGNOSIS — Z9889 Other specified postprocedural states: Secondary | ICD-10-CM | POA: Diagnosis not present

## 2015-10-04 LAB — VAS US CAROTID
LEFT ECA DIAS: -22 cm/s
LEFT VERTEBRAL DIAS: 10 cm/s
LICADSYS: -77 cm/s
LICAPDIAS: 74 cm/s
Left CCA dist dias: 25 cm/s
Left CCA dist sys: 114 cm/s
Left CCA prox dias: 27 cm/s
Left CCA prox sys: 128 cm/s
Left ICA dist dias: -12 cm/s
Left ICA prox sys: 253 cm/s
RCCADSYS: -129 cm/s
RCCAPDIAS: 20 cm/s
RIGHT CCA MID DIAS: 23 cm/s
RIGHT ECA DIAS: -7 cm/s
RIGHT VERTEBRAL DIAS: -20 cm/s
Right CCA prox sys: 116 cm/s

## 2015-10-04 NOTE — Patient Instructions (Signed)
Stroke Prevention Some medical conditions and behaviors are associated with an increased chance of having a stroke. You may prevent a stroke by making healthy choices and managing medical conditions. HOW CAN I REDUCE MY RISK OF HAVING A STROKE?   Stay physically active. Get at least 30 minutes of activity on most or all days.  Do not smoke. It may also be helpful to avoid exposure to secondhand smoke.  Limit alcohol use. Moderate alcohol use is considered to be:  No more than 2 drinks per day for men.  No more than 1 drink per day for nonpregnant women.  Eat healthy foods. This involves:  Eating 5 or more servings of fruits and vegetables a day.  Making dietary changes that address high blood pressure (hypertension), high cholesterol, diabetes, or obesity.  Manage your cholesterol levels.  Making food choices that are high in fiber and low in saturated fat, trans fat, and cholesterol may control cholesterol levels.  Take any prescribed medicines to control cholesterol as directed by your health care provider.  Manage your diabetes.  Controlling your carbohydrate and sugar intake is recommended to manage diabetes.  Take any prescribed medicines to control diabetes as directed by your health care provider.  Control your hypertension.  Making food choices that are low in salt (sodium), saturated fat, trans fat, and cholesterol is recommended to manage hypertension.  Ask your health care provider if you need treatment to lower your blood pressure. Take any prescribed medicines to control hypertension as directed by your health care provider.  If you are 18-39 years of age, have your blood pressure checked every 3-5 years. If you are 40 years of age or older, have your blood pressure checked every year.  Maintain a healthy weight.  Reducing calorie intake and making food choices that are low in sodium, saturated fat, trans fat, and cholesterol are recommended to manage  weight.  Stop drug abuse.  Avoid taking birth control pills.  Talk to your health care provider about the risks of taking birth control pills if you are over 35 years old, smoke, get migraines, or have ever had a blood clot.  Get evaluated for sleep disorders (sleep apnea).  Talk to your health care provider about getting a sleep evaluation if you snore a lot or have excessive sleepiness.  Take medicines only as directed by your health care provider.  For some people, aspirin or blood thinners (anticoagulants) are helpful in reducing the risk of forming abnormal blood clots that can lead to stroke. If you have the irregular heart rhythm of atrial fibrillation, you should be on a blood thinner unless there is a good reason you cannot take them.  Understand all your medicine instructions.  Make sure that other conditions (such as anemia or atherosclerosis) are addressed. SEEK IMMEDIATE MEDICAL CARE IF:   You have sudden weakness or numbness of the face, arm, or leg, especially on one side of the body.  Your face or eyelid droops to one side.  You have sudden confusion.  You have trouble speaking (aphasia) or understanding.  You have sudden trouble seeing in one or both eyes.  You have sudden trouble walking.  You have dizziness.  You have a loss of balance or coordination.  You have a sudden, severe headache with no known cause.  You have new chest pain or an irregular heartbeat. Any of these symptoms may represent a serious problem that is an emergency. Do not wait to see if the symptoms will   go away. Get medical help at once. Call your local emergency services (911 in [place] Do not drive yourself to the hospital.   This information is not intended to replace advice given to you by your health care provider. Make sure you discuss any questions you have with your health care provider.   Document Released: 02/14/2004 Document Revised: 01/27/2014 Document Reviewed:  07/09/2012 Elsevier Interactive Patient Education 2016 [employer]  

## 2015-10-04 NOTE — Progress Notes (Signed)
Chief Complaint: Follow up Extracranial Carotid Artery Stenosis   History of Present Illness  [name] is a 75 y.o. female patient of Dr. Oneida Alar who is s/p right CEA on 08/08/2014.  She had a right CVA (embolosim of right cerebral artery), pt states during the surgery, with left upper and lower extremity weakness. She has made progress; improved  facial droop, and left leg strength.  She continues to ambulate with a cane.   She denies any subsequent stroke or TIA.   Dr. Meda Coffee, her cardiologist, monitors her PAD.   The patient denies New Medical or Surgical History.   Pt Diabetic: no Pt smoker: smoker  (1-2 cigarettes/day, started at age 11 yrs, quit a couple of times)  Pt meds include: Statin : yes ASA: yes Other anticoagulants/antiplatelets: no   Past Medical History:  Diagnosis Date  . Acute respiratory failure with hypoxia s/p tracheostomy 07/2013  . Anemia    a. 7-08/2013 felt due to recent critical illness/chronic disease.  . Arthritis    handds, knees & back   . Barrett's esophagus   . CAD (coronary artery disease)    a. Mod dz 2010 initially mgd medically. b. 12/2012 - angina s/p PTCA/DES to mid-circumflex, PTCA/DES to first OM.   Marland Kitchen Carotid artery disease ([place])    a. 60-70% bilat ICA stenosis by dopplers 05/2012.  Marland Kitchen Chronic back pain    deteriorating;DDD  . Colonic obstruction due to suspected colitis; s/p colectomy/colostomy    a. See SBO.  Marland Kitchen Complication of anesthesia    hard to wake up from anesthesia   . COPD (chronic obstructive pulmonary disease) ([place])   . Depression    takes Remeron and Zoloft daily  . Dizziness    was on Bentyl which caused this-took off of it and no problems since  . DVT of axillary vein, acute right    a. Dx 07/2013 felt due to R IJ central line that had been inserted 7/14. Not on anticoag due to GIB issues and small cerebral bleed.  Marland Kitchen GERD (gastroesophageal reflux disease)   . Heart murmur   . History of bronchitis    >  61yr ago   . History of colon polyps   . History of hiatal hernia   . History of kidney stones    was told it was stable but doesn't know for sure that she ever passed it  . HTN (hypertension)   . Hypertension    takes Metoprolol daily  . Intracranial hemorrhage ([place]    a. 08/11/13 - per DC summary, tiny SAH vs SDH done for altered mentation, anticoag stopped including aspirin.  . Joint pain   . Neck pain    DDD  . Other and unspecified hyperlipidemia    takes Lipitor daily  . Pneumonia    hx of->15 yrs ago  . Protein-calorie malnutrition, severe w/ electrolyte imbalance    a. Severe hypoalbuminemia leading to 3rd spacing including anasarca and pulm edema 07/2013.  .[place] KitchenSBO ([place] bowel obstruction) [place]    a. Lysis of adhesions and ovarian cystectomy 04/2013 for sBO. b. Admitted 07/2013 for colonic obstruction due to suspected colitis, s/p partial colectomy/colostomy 08/01/13. Admission complicated by anasarca, acute resp failure requiring tracheostomy, decannulated 08/25/13. c. Recurrent SBO8/2015, NGT placed for decompression.   . Stroke ([facility] early 862's  right sided weakness--TIA    Social History Social History  Substance Use Topics  . Smoking status: Current Some Day Smoker    Packs/day: 0.50  Years: 53.00    Types: Cigarettes  . Smokeless tobacco: Never Used     Comment: also uses e cig, 4 A DAY  . Alcohol use No    Family History Family History  Problem Relation Age of Onset  . Varicose Veins Mother   . Heart disease Father   . Heart attack Father   . AAA (abdominal aortic aneurysm) Father   . Heart disease      Surgical History Past Surgical History:  Procedure Laterality Date  . ABDOMINAL HYSTERECTOMY    . APPENDECTOMY    . BOWEL RESECTION N/A 08/01/2013   Procedure: SMALL BOWEL RESECTION;  Surgeon: Harl Bowie, MD;  Location: [facility];  Service: General;  Laterality: N/A;  . cataract surgery Bilateral   . CHOLECYSTECTOMY    . COLON RESECTION    .  COLON SURGERY    . COLOSTOMY N/A 08/01/2013   Procedure: COLOSTOMY;  Surgeon: Harl Bowie, MD;  Location: [facility];  Service: General;  Laterality: N/A;  . COLOSTOMY TAKEDOWN  01/24/2014   dr blackman  . COLOSTOMY TAKEDOWN N/A 01/24/2014   Procedure: COLOSTOMY TAKEDOWN;  Surgeon: Coralie Keens, MD;  Location: [place];  Service: General;  Laterality: N/A;  . CORONARY ANGIOPLASTY WITH STENT PLACEMENT  12/20/2012   STENT TO OM         DR COOPER  . ENDARTERECTOMY Right 08/08/2014   Procedure: RIGHT CAROTID ENDARTERECTOMY WITH HEMASHIELD PATCH ANGIOPLASTY;  Surgeon: Elam Dutch, MD;  Location: [facility];  Service: Vascular;  Laterality: Right;  . FOOT SURGERY Left   . HAND SURGERY Bilateral    for ganglion cysts  . LAPAROTOMY N/A 08/01/2013   Procedure: EXPLORATORY LAPAROTOMY;  Surgeon: Harl Bowie, MD;  Location: [place];  Service: General;  Laterality: N/A;  . LEFT HEART CATHETERIZATION WITH CORONARY ANGIOGRAM N/A 12/20/2012   Procedure: LEFT HEART CATHETERIZATION WITH CORONARY ANGIOGRAM;  Surgeon: Blane Ohara, MD;  Location: [facility] CATH LAB;  Service: Cardiovascular;  Laterality: N/A;  . PARTIAL COLECTOMY N/A 08/01/2013   Procedure: PARTIAL COLECTOMY;  Surgeon: Harl Bowie, MD;  Location: [place];  Service: General;  Laterality: N/A;  . ROTATOR CUFF REPAIR Left   . TONSILLECTOMY    . TRACHEOSTOMY     feinstein    Allergies  Allergen Reactions  . Pregabalin Swelling    Tongue swelling  . Sulfonamide Derivatives Swelling    Childhood reaction    Current Outpatient Prescriptions  Medication Sig Dispense Refill  . aspirin EC 81 MG tablet Take 162 mg by mouth daily.    Marland Kitchen atorvastatin (LIPITOR) 80 MG tablet Take 80 mg by mouth every evening.     Marland Kitchen buPROPion (WELLBUTRIN XL) 150 MG 24 hr tablet Take 1 tablet (150 mg total) by mouth daily. 90 tablet 3  . docusate sodium (COLACE) 100 MG capsule Take 100 mg by mouth 2 (two) times daily.    . fish oil-omega-3 fatty acids 1000 MG  capsule Take 1,000 mg by mouth daily.     . folic acid (FOLVITE) 643 MCG tablet Take 800 mcg by mouth daily.     Marland Kitchen gabapentin (NEURONTIN) 300 MG capsule Take 300 mg by mouth 3 (three) times daily as needed (nerve pain).    . metoprolol succinate (TOPROL-XL) 25 MG 24 hr tablet Take 1 tablet (25 mg total) by mouth every morning. 90 tablet 0  . mirtazapine (REMERON) 30 MG tablet Take 1 tablet by mouth at bedtime.     Marland Kitchen  nitroGLYCERIN (NITROSTAT) 0.4 MG SL tablet Place 1 tablet (0.4 mg total) under the tongue every 5 (five) minutes as needed for chest pain. 25 tablet 3  . ondansetron (ZOFRAN) 4 MG tablet Take 4 mg by mouth every 8 (eight) hours as needed for nausea or vomiting.     . sertraline (ZOLOFT) 100 MG tablet Take 100 mg by mouth 2 (two) times daily.     No current facility-administered medications for this visit.     Review of Systems : See HPI for pertinent positives and negatives.  Physical Examination  Vitals:   10/04/15 1506 10/04/15 1507  BP: 132/74 133/71  Pulse: 73   SpO2: 97%   Weight: 176 lb (79.8 kg)   Height: 5' (1.524 m)    Body mass index is 34.37 kg/m.  General: WDWN obese female in NAD GAIT: normal Eyes: PERRLA Pulmonary:  Respirations are non-labored, good air movement, CTAB  Cardiac: regular rhythm, no detected murmur.  VASCULAR EXAM Carotid Bruits Right Left   Positive positive    Aorta is not palpable. Radial pulses are 2+ palpable and equal.                                                                                                                            LE Pulses Right Left       POPLITEAL  not palpable   not palpable       POSTERIOR TIBIAL  not palpable   not palpable        DORSALIS PEDIS      ANTERIOR TIBIAL not palpable  not palpable     Gastrointestinal: soft, nontender, BS WNL, no r/g, no palpable masses.  Musculoskeletal: No muscle atrophy/wasting. M/S 5/5  In her right upper and lower extremites, 4/5 in her left upper and  lower extremities, extremities without ischemic changes.  Neurologic: A&O X 3; Appropriate Affect, Speech is normal, CN 2-12 intact, pain and light touch intact in extremities, Motor exam as listed above.    Assessment: [name] is a 75 y.o. female who is s/p right CEA on 08/08/2014.  She had a right CVA (embolosim of right cerebral artery), pt states during the surgery, with left upper and lower extremity weakness. She has made progress; improved  facial droop, and left leg strength.   She denies any subsequent stroke or TIA.  Her atherosclerotic risk factors include continued smoking, CAD, COPD, and obesity.    DATA Today's carotid duplex suggests a patent right CEA site with <40% ICA stenosis, the artery appears to narrow in caliber and is somewhat tortuous.  60-79% left ICA stenosis, artery noted to be tortuous. Technically difficult exam due to extreme movement.  No significant change compared to exam of 03/01/15.   Plan:  The patient was counseled re smoking cessation and given several free resources re smoking cessation.   Follow-up in 6 months with Carotid Duplex scan   I discussed in depth with the  patient the nature of atherosclerosis, and emphasized the importance of maximal medical management including strict control of blood pressure, blood glucose, and lipid levels, obtaining regular exercise, and cessation of smoking.  The patient is aware that without maximal medical management the underlying atherosclerotic disease process will progress, limiting the benefit of any interventions. The patient was given information about stroke prevention and what symptoms should prompt the patient to seek immediate medical care. Thank you for allowing [place] to participate in this patient's care.  Clemon Chambers, RN, MSN, FNP-C [facility] Office: [phone]  Clinic Physician: Oneida Alar  10/04/15 3:17 PM

## 2015-10-18 DIAGNOSIS — Z86010 Personal history of colonic polyps: Secondary | ICD-10-CM | POA: Diagnosis not present

## 2015-10-18 DIAGNOSIS — K2270 Barrett's esophagus without dysplasia: Secondary | ICD-10-CM | POA: Diagnosis not present

## 2015-10-18 DIAGNOSIS — K589 Irritable bowel syndrome without diarrhea: Secondary | ICD-10-CM | POA: Diagnosis not present

## 2015-10-18 DIAGNOSIS — D649 Anemia, unspecified: Secondary | ICD-10-CM | POA: Diagnosis not present

## 2015-11-21 DIAGNOSIS — D123 Benign neoplasm of transverse colon: Secondary | ICD-10-CM | POA: Diagnosis not present

## 2015-11-21 DIAGNOSIS — K209 Esophagitis, unspecified: Secondary | ICD-10-CM | POA: Diagnosis not present

## 2015-11-21 DIAGNOSIS — Z1211 Encounter for screening for malignant neoplasm of colon: Secondary | ICD-10-CM | POA: Diagnosis not present

## 2015-11-21 DIAGNOSIS — D120 Benign neoplasm of cecum: Secondary | ICD-10-CM | POA: Diagnosis not present

## 2015-11-21 DIAGNOSIS — Z86010 Personal history of colonic polyps: Secondary | ICD-10-CM | POA: Diagnosis not present

## 2015-11-21 DIAGNOSIS — K635 Polyp of colon: Secondary | ICD-10-CM | POA: Diagnosis not present

## 2015-12-26 NOTE — Addendum Note (Signed)
Addended by: Lianne Cure A on: 12/26/2015 03:18 PM   Modules accepted: Orders

## 2016-01-03 IMAGING — CR DG ABD PORTABLE 1V
1 series · 1 of 1 positions shown · non-contrast
Comparison: None.

CLINICAL DATA: Acute onset of abdominal pain. Assess for small
bowel obstruction. Initial encounter.

EXAM:
PORTABLE ABDOMEN - 1 VIEW

[AP]
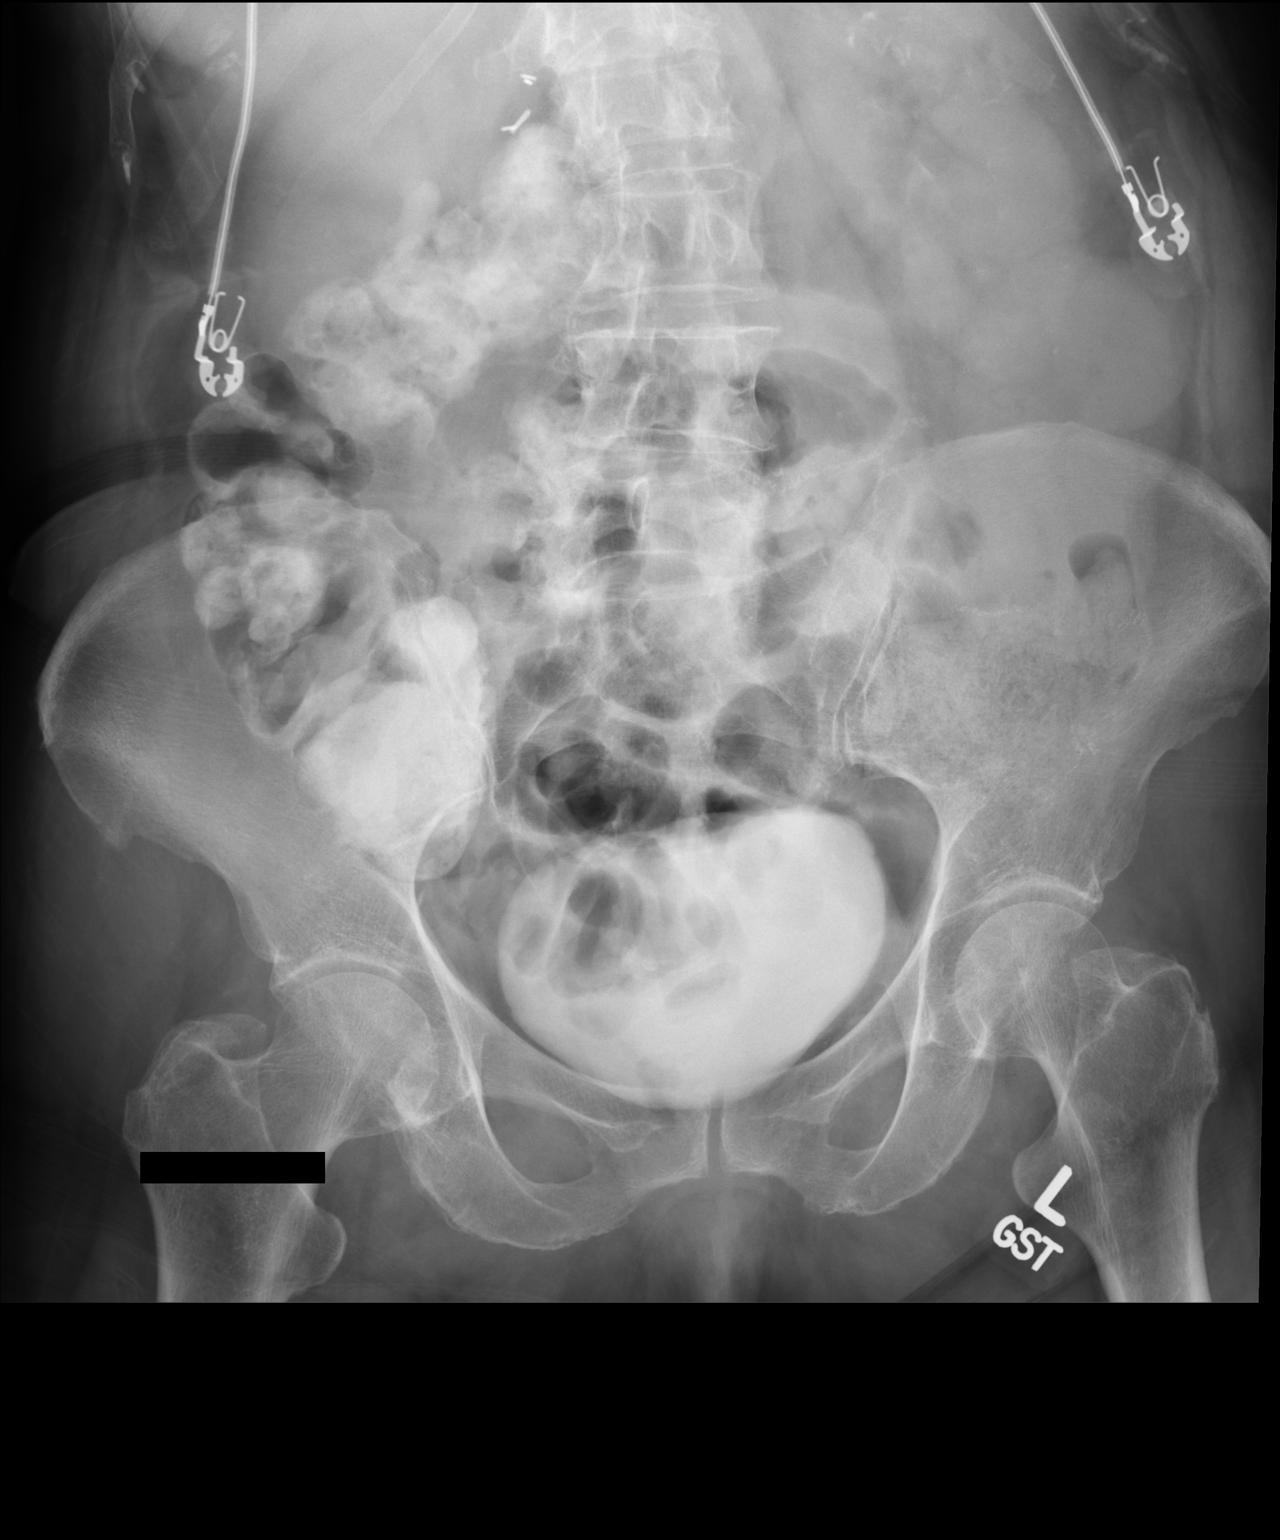

[1 of 1 positions shown; findings below may reference images not displayed]

FINDINGS: The visualized bowel gas pattern is grossly unremarkable. Contrast
is seen within the cecum and ascending colon, suggesting successful
passage of contrast since a recent prior study. No free
intra-abdominal air is identified, though evaluation for free air is
limited on a single supine view.

Contrast is seen filling the bladder. No acute osseous abnormalities
are identified. Degenerative change is noted at the lower lumbar
spine. Clips are noted within the right upper quadrant, reflecting
prior cholecystectomy.
IMPRESSION: Unremarkable bowel gas pattern; no free intra-abdominal air seen.
Contrast seen within the cecum and ascending colon, suggesting
successful passage of contrast since a recent prior study. No
evidence for bowel obstruction.

## 2016-01-04 ENCOUNTER — Ambulatory Visit: Payer: Medicare Other | Admitting: Cardiology

## 2016-01-17 DIAGNOSIS — J189 Pneumonia, unspecified organism: Secondary | ICD-10-CM | POA: Diagnosis not present

## 2016-01-17 DIAGNOSIS — J9601 Acute respiratory failure with hypoxia: Secondary | ICD-10-CM | POA: Diagnosis not present

## 2016-01-17 DIAGNOSIS — Z720 Tobacco use: Secondary | ICD-10-CM | POA: Diagnosis not present

## 2016-01-18 DIAGNOSIS — J189 Pneumonia, unspecified organism: Secondary | ICD-10-CM | POA: Diagnosis not present

## 2016-01-18 DIAGNOSIS — J9601 Acute respiratory failure with hypoxia: Secondary | ICD-10-CM | POA: Diagnosis not present

## 2016-01-18 DIAGNOSIS — Z720 Tobacco use: Secondary | ICD-10-CM | POA: Diagnosis not present

## 2016-01-19 DIAGNOSIS — J189 Pneumonia, unspecified organism: Secondary | ICD-10-CM | POA: Diagnosis not present

## 2016-01-19 DIAGNOSIS — Z720 Tobacco use: Secondary | ICD-10-CM | POA: Diagnosis not present

## 2016-01-19 DIAGNOSIS — J9601 Acute respiratory failure with hypoxia: Secondary | ICD-10-CM | POA: Diagnosis not present

## 2016-01-23 DIAGNOSIS — Z1389 Encounter for screening for other disorder: Secondary | ICD-10-CM | POA: Diagnosis not present

## 2016-01-23 DIAGNOSIS — E785 Hyperlipidemia, unspecified: Secondary | ICD-10-CM | POA: Diagnosis not present

## 2016-01-23 DIAGNOSIS — J189 Pneumonia, unspecified organism: Secondary | ICD-10-CM | POA: Diagnosis not present

## 2016-01-23 DIAGNOSIS — I10 Essential (primary) hypertension: Secondary | ICD-10-CM | POA: Diagnosis not present

## 2016-01-23 DIAGNOSIS — J449 Chronic obstructive pulmonary disease, unspecified: Secondary | ICD-10-CM | POA: Diagnosis not present

## 2016-01-24 ENCOUNTER — Other Ambulatory Visit: Payer: Self-pay | Admitting: *Deleted

## 2016-01-24 MED ORDER — NITROGLYCERIN 0.4 MG SL SUBL: 0.4000 mg | SUBLINGUAL_TABLET | SUBLINGUAL | 1 refills | Status: DC | PRN

## 2016-01-30 ENCOUNTER — Ambulatory Visit (INDEPENDENT_AMBULATORY_CARE_PROVIDER_SITE_OTHER): Payer: Medicare Other | Admitting: Cardiology

## 2016-01-30 ENCOUNTER — Encounter (INDEPENDENT_AMBULATORY_CARE_PROVIDER_SITE_OTHER): Payer: Self-pay

## 2016-01-30 VITALS — BP 130/70 | HR 100 | Ht 60.0 in | Wt 172.0 lb

## 2016-01-30 DIAGNOSIS — I10 Essential (primary) hypertension: Secondary | ICD-10-CM | POA: Diagnosis not present

## 2016-01-30 DIAGNOSIS — E782 Mixed hyperlipidemia: Secondary | ICD-10-CM

## 2016-01-30 DIAGNOSIS — I6523 Occlusion and stenosis of bilateral carotid arteries: Secondary | ICD-10-CM

## 2016-01-30 DIAGNOSIS — I5032 Chronic diastolic (congestive) heart failure: Secondary | ICD-10-CM

## 2016-01-30 DIAGNOSIS — I2583 Coronary atherosclerosis due to lipid rich plaque: Secondary | ICD-10-CM

## 2016-01-30 DIAGNOSIS — I2510 Atherosclerotic heart disease of native coronary artery without angina pectoris: Secondary | ICD-10-CM

## 2016-01-30 DIAGNOSIS — J189 Pneumonia, unspecified organism: Secondary | ICD-10-CM

## 2016-01-30 DIAGNOSIS — I739 Peripheral vascular disease, unspecified: Secondary | ICD-10-CM

## 2016-01-30 NOTE — Patient Instructions (Signed)
Medication Instructions:   Your physician recommends that you continue on your current medications as directed. Please refer to the Current Medication list given to you today.    Labwork:  TODAY--CMET, CBC W DIFF, TSH, AND LIPIDS    Follow-Up:  3 MONTHS WITH DR Meda Coffee       If you need a refill on your cardiac medications before your next appointment, please call your pharmacy.

## 2016-01-30 NOTE — Progress Notes (Signed)
Patient ID: [name], female   DOB: 11/30/1940, 76 y.o.   MRN: [id number]    Patient Name: [name] Date of Encounter: 01/30/2016  Primary Care Provider:  [name], MD Primary Cardiologist:  Ena Dawley  Problem List   Past Medical History:  Diagnosis Date  . Acute respiratory failure with hypoxia s/p tracheostomy 07/2013  . Anemia    a. 7-08/2013 felt due to recent critical illness/chronic disease.  . Arthritis    handds, knees & back   . Barrett's esophagus   . CAD (coronary artery disease)    a. Mod dz 2010 initially mgd medically. b. 12/2012 - angina s/p PTCA/DES to mid-circumflex, PTCA/DES to first OM.   Marland Kitchen Carotid artery disease ([place])    a. 60-70% bilat ICA stenosis by dopplers 05/2012.  Marland Kitchen Chronic back pain    deteriorating;DDD  . Colonic obstruction due to suspected colitis; s/p colectomy/colostomy    a. See SBO.  Marland Kitchen Complication of anesthesia    hard to wake up from anesthesia   . COPD (chronic obstructive pulmonary disease) ([place])   . Depression    takes Remeron and Zoloft daily  . Dizziness    was on Bentyl which caused this-took off of it and no problems since  . DVT of axillary vein, acute right ([place])    a. Dx 07/2013 felt due to R IJ central line that had been inserted 7/14. Not on anticoag due to GIB issues and small cerebral bleed.  Marland Kitchen GERD (gastroesophageal reflux disease)   . Heart murmur   . History of bronchitis    > 64yr ago   . History of colon polyps   . History of hiatal hernia   . History of kidney stones    was told it was stable but doesn't know for sure that she ever passed it  . HTN (hypertension)   . Hypertension    takes Metoprolol daily  . Intracranial hemorrhage ([place]    a. 08/11/13 - per DC summary, tiny SAH vs SDH done for altered mentation, anticoag stopped including aspirin.  . Joint pain   . Neck pain    DDD  . Other and unspecified hyperlipidemia    takes Lipitor daily  . Pneumonia    hx of->15 yrs  ago  . Protein-calorie malnutrition, severe w/ electrolyte imbalance    a. Severe hypoalbuminemia leading to 3rd spacing including anasarca and pulm edema 07/2013.  .Marland KitchenSBO (small bowel obstruction)    a. Lysis of adhesions and ovarian cystectomy 04/2013 for sBO. b. Admitted 07/2013 for colonic obstruction due to suspected colitis, s/p partial colectomy/colostomy 08/01/13. Admission complicated by anasarca, acute resp failure requiring tracheostomy, decannulated 08/25/13. c. Recurrent SBO8/2015, NGT placed for decompression.   . Stroke ([facility] early 80's   right sided weakness--TIA   Past Surgical History:  Procedure Laterality Date  . ABDOMINAL HYSTERECTOMY    . APPENDECTOMY    . BOWEL RESECTION N/A 08/01/2013   Procedure: SMALL BOWEL RESECTION;  Surgeon: DHarl Bowie MD;  [place]  Service: General;  Laterality: N/A;  . cataract surgery Bilateral   . CHOLECYSTECTOMY    . COLON RESECTION    . COLON SURGERY    . COLOSTOMY N/A 08/01/2013   Procedure: COLOSTOMY;  Surgeon: DHarl Bowie MD;  [place]  Service: General;  Laterality: N/A;  . COLOSTOMY TAKEDOWN  01/24/2014   dr blackman  . COLOSTOMY TAKEDOWN N/A 01/24/2014   Procedure: COLOSTOMY TAKEDOWN;  Surgeon:  Coralie Keens, MD;  Location: [place];  Service: General;  Laterality: N/A;  . CORONARY ANGIOPLASTY WITH STENT PLACEMENT  12/20/2012   STENT TO OM         DR COOPER  . ENDARTERECTOMY Right 08/08/2014   Procedure: RIGHT CAROTID ENDARTERECTOMY WITH HEMASHIELD PATCH ANGIOPLASTY;  Surgeon: Elam Dutch, MD;  Location: [place];  Service: Vascular;  Laterality: Right;  . FOOT SURGERY Left   . HAND SURGERY Bilateral    for ganglion cysts  . LAPAROTOMY N/A 08/01/2013   Procedure: EXPLORATORY LAPAROTOMY;  Surgeon: Harl Bowie, MD;  Location: [place];  Service: General;  Laterality: N/A;  . LEFT HEART CATHETERIZATION WITH CORONARY ANGIOGRAM N/A 12/20/2012   Procedure: LEFT HEART CATHETERIZATION WITH CORONARY  ANGIOGRAM;  Surgeon: Blane Ohara, MD;  Location: [facility] CATH LAB;  Service: Cardiovascular;  Laterality: N/A;  . PARTIAL COLECTOMY N/A 08/01/2013   Procedure: PARTIAL COLECTOMY;  Surgeon: Harl Bowie, MD;  Location: [place];  Service: General;  Laterality: N/A;  . ROTATOR CUFF REPAIR Left   . TONSILLECTOMY    . TRACHEOSTOMY     feinstein    Allergies  Allergies  Allergen Reactions  . Pregabalin Swelling    Tongue swelling  . Sulfonamide Derivatives Swelling    Childhood reaction    HPI  [name] was previously followed by Dr Verl Blalock. She is a pleasant 76 year old female with h/o hyperlipidemia, hypertension, PAD and CAD who returns today for a 1 year follow up. The patient is a lifelong smoker.  She had worsening exertional CP and DOE in November 2014, underwent a cardiac cath on 12/20/2012 with finding of severe stenosis in OM1 and received 2 DES. She was doing well from cardiac standpoint. In May 2015 had a SBO and required partial colon resection and now has a colostomy bag, lost 30 lbs.   09/01/14 - the patient had a rough month, she underwent right carotid endarterectomy on 10/93/2355 that was complicated by postop stroke in the recovery room. This resulted in a left hemiplegia. She still has some mild left facial droop and weakness in her left arm, she is able to walk. She was also admitted on 08/29/14 with a small bowel obstruction that resolved and no surgery was needed. She denies CP, SOB, orthopnea or PND, no LE edema.   06/13/2015  - patient is coming after 3 months, she states that she cut down her smoking to minimally fifth cigarettes a day, she denies any chest shortness of breath no palpitations or syncope. She has no lower extremity edema or orthopnea. She is compliant with her meds and denies any side effects especially with atorvastatin. She saw Dr. Fletcher Anon concluded that she only has mild peripheral arterial disease continue just medical therapy. She is minimally  active.   01/30/2016 - this 6 months follow-up, she was doing well from cardiac standpoint but was hospitalized in [facility] on 01/15/2016 acute respiratory failure and diagnosed with pneumonia and treated with IV antibiotics. She states that she feels significantly better. She has no chest pain other than when she is coughing. Her shortness of breath has improved and she has no lower extremity edema orthopnea or proximal nocturnal dyspnea. She has been compliant with her medicines and has no side effects. She doesn't have much appetite.  Home Medications  Prior to Admission medications   Medication Sig Start Date End Date Taking? Authorizing Provider  aspirin 81 MG tablet Take 81 mg by mouth 2 (two)  times daily.    Yes Historical Provider, MD  atorvastatin (LIPITOR) 80 MG tablet Take 80 mg by mouth daily.   Yes Historical Provider, MD  dicyclomine (BENTYL) 10 MG capsule as directed. 08/20/10  Yes Historical Provider, MD  diphenhydramine-acetaminophen (TYLENOL PM) 25-500 MG TABS Take 1 tablet by mouth at bedtime as needed.     Yes Historical Provider, MD  fish oil-omega-3 fatty acids 1000 MG capsule Take 1 g by mouth daily.     Yes Historical Provider, MD  folic acid (FOLVITE) 474 MCG tablet Take 400 mcg by mouth daily.     Yes Historical Provider, MD  metoprolol succinate (TOPROL-XL) 25 MG 24 hr tablet TAKE 1 TABLET ONCE DAILY. 05/04/12  Yes Renella Cunas, MD  nitroGLYCERIN (NITROSTAT) 0.4 MG SL tablet Place 0.4 mg under the tongue every 5 (five) minutes as needed.     Yes Historical Provider, MD  omeprazole (PRILOSEC) 20 MG capsule Take 20 mg by mouth 2 (two) times daily.     Yes Historical Provider, MD  sertraline (ZOLOFT) 100 MG tablet Take 150 mg by mouth daily.    Yes Historical Provider, MD    Family History  Family History  Problem Relation Age of Onset  . Varicose Veins Mother   . Heart disease Father   . Heart attack Father   . AAA (abdominal aortic aneurysm) Father   .  Heart disease      Social History  Social History   Social History  . Marital status: Married    Spouse name: N/A  . Number of children: N/A  . Years of education: N/A   Occupational History  . full time    Social History Main Topics  . Smoking status: Current Some Day Smoker    Packs/day: 0.50    Years: 53.00    Types: Cigarettes  . Smokeless tobacco: Never Used     Comment: also uses e cig, 4 A DAY  . Alcohol use No  . Drug use: No  . Sexual activity: Not Currently    Birth control/ protection: Surgical   Other Topics Concern  . Not on file   Social History Narrative  . No narrative on file     Review of Systems, as per HPI, otherwise negative General:  No chills, fever, night sweats or weight changes.  Cardiovascular:  No chest pain, dyspnea on exertion, edema, orthopnea, palpitations, paroxysmal nocturnal dyspnea. Dermatological: No rash, lesions/masses Respiratory: No cough, dyspnea Urologic: No hematuria, dysuria Abdominal:   No nausea, vomiting, diarrhea, bright red blood per rectum, melena, or hematemesis Neurologic:  No visual changes, wkns, changes in mental status. All other systems reviewed and are otherwise negative except as noted above.  Physical Exam  Blood pressure 130/70, pulse 100, height 5' (1.524 m), weight 172 lb (78 kg).  General: Pleasant, NAD Psych: Normal affect. Neuro: Alert and oriented X 3. Moves all extremities spontaneously. HEENT: Normal  Neck: Supple without bruits or JVD. Well healed scar post right carotid endarterectomy. Lungs:  Resp regular and unlabored, CTA. Heart: RRR no s3, s4, or murmurs. Abdomen: Soft, non-tender, non-distended, BS + x 4.  Extremities: No clubbing, cyanosis or edema. DP/PT weak B/L, Radials 2+ and equal bilaterally.  Labs:  No results for input(s): CKTOTAL, CKMB, TROPONINI in the last 72 hours. Lab Results  Component Value Date   WBC 6.7 01/23/2015   HGB 10.5 (L) 01/23/2015   HCT 34.6 (L)  01/23/2015   MCV 77.2 (L) 01/23/2015  PLT 273 01/23/2015    Lab Results  Component Value Date   CHOL 95 08/09/2014   HDL 36 (L) 08/09/2014   LDLCALC 40 08/09/2014   TRIG 97 08/09/2014   Lab Results  Component Value Date   DDIMER 2.78 (H) 10/20/2013   Invalid input(s): POCBNP  Accessory Clinical Findings  Echocardiogram - 08/03/2013 Study Conclusions  - Left ventricle: The cavity size was normal. Systolic function was normal. Wall motion was normal; there were no regional wall motion abnormalities. - Left atrium: The atrium was severely dilated.  ECG - SR, normal ECG  Cardiac cath 12/20/2012   Lexiscan nuclear stress test: 11/04/2013 Quantitative Gated Spect Images  QGS EDV: 70 ml  QGS ESV: 22 ml  Impression  Exercise Capacity: Lexiscan with no exercise.  BP Response: Normal blood pressure response.  Clinical Symptoms: Chest tightness  ECG Impression: No significant ST segment change suggestive of ischemia.  Comparison with Prior Nuclear Study: Study is compared with the report of the study from October, 2009  Overall Impression: Normal stress nuclear study. No scar or ischemia. Low risk scan. No change when compared to the report of October, 2009  LV Ejection Fraction: 69%. LV Wall Motion: Normal Wall Motion.  03/2014  Carotid US Heterogeneous plaque, bilaterally. Essentially stable 60-79% bilateral ICA stenosis.(LICA high end of range) Elevated left subclavian artery velocities. Patent vertebral arteries with antegrade flow  08/09/2014 Left ventricle: The cavity size was normal. There was mild concentric hypertrophy. Systolic function was vigorous. The estimated ejection fraction was in the range of 65% to 70%. Wall motion was normal; there were no regional wall motion abnormalities. Doppler parameters are consistent with elevated ventricular end-diastolic filling pressure. - Aortic valve: Trileaflet; normal thickness leaflets. There was  no regurgitation. - Aortic root: The aortic root was normal in size. - Left atrium: The atrium was mildly dilated. - Right ventricle: Systolic function was normal. - Right atrium: The atrium was normal in size. - Tricuspid valve: There was trivial regurgitation. - Pulmonic valve: There was no regurgitation. - Pulmonary arteries: Systolic pressure was within the normal range. - Inferior vena cava: The vessel was normal in size. - Pericardium, extracardiac: There was no pericardial effusion.   Assessment & Plan   76 year old female with multiple risk factors for CAD including lifelong smoking, known B/L carotid disease, HTN and hyperlipidemia  1. Post- hospitalization visit after acte respiratory failure and pneumonia - she is doing significantly better with improved shortness of breath. She appears dehydrated and her heart rate is borderline tachycardic she is advised to increase her hydration and we'll check kidney function and electrolytes today.  2. Claudications - she was seen by Dr. Fletcher Anon: the patient has mild peripheral arterial disease overall and I suspect that her symptoms are multifactorial and most related to her back situation. Her peripheral arterial disease does not require revascularization and I recommend continuing medical therapy. Continue treatment with aspirin and atorvastatin. She has quit smoking with her pneumonia she is complimented for that.  3. CAD- stable - Severe left circumflex and OM disease treated successfully with PCI x 2 on 12/20/2012. DAPT d/c-ed in 12/15. Negative stress test on 11/07/2013.  Continue ASA and atorvastatin, metoprolol. She is asymptomatic. Her EKG today is completely normal other than heart rate is increased at 100 bpm.  4. Chronic diastolic CHF - continue holding lasix, TTE on 08/09/14 shows elevated LVEDP, however appears dry today we'll check her labs.  5. Hypertension - controlled, continue current management.  6. Hyperlipidemia -  on high dose of atorvastatin 80 mg PO QHS, normal LFTs. We will check her lipids and CMP today.   7. Carotid disease - s/p right carotid endarterectomy - complicated by stroke. Followed by Dr Oneida Alar. No bruit.  Follow up in 3 months. This was a 14 day TOC visit after hospitalization, check CBC, CMP, TSH, lipids today.  Ena Dawley, MD, [facility] 01/30/2016, 10:49 AM

## 2016-01-31 LAB — CBC WITH DIFFERENTIAL/PLATELET
Basophils Absolute: 0 10*3/uL (ref 0.0–0.2)
Basos: 0 %
EOS (ABSOLUTE): 0.1 10*3/uL (ref 0.0–0.4)
Eos: 1 %
Hematocrit: 35 % (ref 34.0–46.6)
Hemoglobin: 11 g/dL — ABNORMAL LOW (ref 11.1–15.9)
Immature Grans (Abs): 0 10*3/uL (ref 0.0–0.1)
Immature Granulocytes: 0 %
Lymphocytes Absolute: 2.7 10*3/uL (ref 0.7–3.1)
Lymphs: 27 %
MCH: 26 pg — ABNORMAL LOW (ref 26.6–33.0)
MCHC: 31.4 g/dL — ABNORMAL LOW (ref 31.5–35.7)
MCV: 83 fL (ref 79–97)
Monocytes Absolute: 1 10*3/uL — ABNORMAL HIGH (ref 0.1–0.9)
Monocytes: 10 %
Neutrophils Absolute: 6.1 10*3/uL (ref 1.4–7.0)
Neutrophils: 62 %
Platelets: 320 10*3/uL (ref 150–379)
RBC: 4.23 x10E6/uL (ref 3.77–5.28)
RDW: 16.6 % — ABNORMAL HIGH (ref 12.3–15.4)
WBC: 9.9 10*3/uL (ref 3.4–10.8)

## 2016-01-31 LAB — LIPID PANEL
Chol/HDL Ratio: 3.9 ratio units (ref 0.0–4.4)
Cholesterol, Total: 150 mg/dL (ref 100–199)
HDL: 38 mg/dL — ABNORMAL LOW (ref >39)
LDL Calculated: 84 mg/dL (ref 0–99)
Triglycerides: 138 mg/dL (ref 0–149)
VLDL Cholesterol Cal: 28 mg/dL (ref 5–40)

## 2016-01-31 LAB — COMPREHENSIVE METABOLIC PANEL
ALT: 17 IU/L (ref 0–32)
AST: 15 IU/L (ref 0–40)
Albumin/Globulin Ratio: 1.4 (ref 1.2–2.2)
Albumin: 3.9 g/dL (ref 3.5–4.8)
Alkaline Phosphatase: 93 IU/L (ref 39–117)
BUN/Creatinine Ratio: 18 (ref 12–28)
BUN: 16 mg/dL (ref 8–27)
Bilirubin Total: 0.6 mg/dL (ref 0.0–1.2)
CO2: 23 mmol/L (ref 18–29)
Calcium: 9.1 mg/dL (ref 8.7–10.3)
Chloride: 103 mmol/L (ref 96–106)
Creatinine, Ser: 0.89 mg/dL (ref 0.57–1.00)
GFR calc Af Amer: 73 mL/min/{1.73_m2} (ref >59)
GFR calc non Af Amer: 63 mL/min/{1.73_m2} (ref >59)
Globulin, Total: 2.7 g/dL (ref 1.5–4.5)
Glucose: 118 mg/dL — ABNORMAL HIGH (ref 65–99)
Potassium: 4.6 mmol/L (ref 3.5–5.2)
Sodium: 140 mmol/L (ref 134–144)
Total Protein: 6.6 g/dL (ref 6.0–8.5)

## 2016-01-31 LAB — TSH: TSH: 0.843 u[IU]/mL (ref 0.450–4.500)

## 2016-02-09 DIAGNOSIS — J441 Chronic obstructive pulmonary disease with (acute) exacerbation: Secondary | ICD-10-CM | POA: Diagnosis not present

## 2016-02-09 DIAGNOSIS — R0602 Shortness of breath: Secondary | ICD-10-CM | POA: Diagnosis not present

## 2016-02-09 DIAGNOSIS — R05 Cough: Secondary | ICD-10-CM | POA: Diagnosis not present

## 2016-02-20 DIAGNOSIS — J449 Chronic obstructive pulmonary disease, unspecified: Secondary | ICD-10-CM | POA: Diagnosis not present

## 2016-02-20 DIAGNOSIS — M8589 Other specified disorders of bone density and structure, multiple sites: Secondary | ICD-10-CM | POA: Diagnosis not present

## 2016-02-28 ENCOUNTER — Encounter: Payer: Self-pay | Admitting: Cardiology

## 2016-03-14 DIAGNOSIS — Z9181 History of falling: Secondary | ICD-10-CM | POA: Diagnosis not present

## 2016-03-14 DIAGNOSIS — Z0000 Encounter for general adult medical examination without abnormal findings: Secondary | ICD-10-CM | POA: Diagnosis not present

## 2016-03-17 DIAGNOSIS — M8589 Other specified disorders of bone density and structure, multiple sites: Secondary | ICD-10-CM | POA: Diagnosis not present

## 2016-03-17 DIAGNOSIS — M8580 Other specified disorders of bone density and structure, unspecified site: Secondary | ICD-10-CM | POA: Insufficient documentation

## 2016-03-17 DIAGNOSIS — M85852 Other specified disorders of bone density and structure, left thigh: Secondary | ICD-10-CM | POA: Diagnosis not present

## 2016-03-17 DIAGNOSIS — Z1231 Encounter for screening mammogram for malignant neoplasm of breast: Secondary | ICD-10-CM | POA: Diagnosis not present

## 2016-03-21 ENCOUNTER — Other Ambulatory Visit: Payer: Self-pay | Admitting: Cardiology

## 2016-03-21 ENCOUNTER — Encounter: Payer: Self-pay | Admitting: Family

## 2016-04-03 ENCOUNTER — Ambulatory Visit (HOSPITAL_COMMUNITY)
Admission: RE | Admit: 2016-04-03 | Discharge: 2016-04-03 | Disposition: A | Discharge status: Home / Self Care | Payer: Medicare Other | Source: Ambulatory Visit | Attending: Vascular Surgery | Admitting: Vascular Surgery

## 2016-04-03 ENCOUNTER — Ambulatory Visit (INDEPENDENT_AMBULATORY_CARE_PROVIDER_SITE_OTHER): Payer: Medicare Other | Admitting: Vascular Surgery

## 2016-04-03 ENCOUNTER — Ambulatory Visit: Payer: Medicare Other | Admitting: Family

## 2016-04-03 VITALS — BP 146/80 | HR 83 | Temp 98.9°F | Resp 16 | Ht 60.0 in | Wt 172.0 lb

## 2016-04-03 DIAGNOSIS — F17200 Nicotine dependence, unspecified, uncomplicated: Secondary | ICD-10-CM | POA: Diagnosis not present

## 2016-04-03 DIAGNOSIS — I6523 Occlusion and stenosis of bilateral carotid arteries: Secondary | ICD-10-CM

## 2016-04-03 DIAGNOSIS — Z9889 Other specified postprocedural states: Secondary | ICD-10-CM | POA: Insufficient documentation

## 2016-04-03 LAB — VAS US CAROTID
LCCADDIAS: -23 cm/s
LEFT ECA DIAS: -13 cm/s
LICAPDIAS: 52 cm/s
LICAPSYS: 183 cm/s
Left CCA dist sys: -90 cm/s
Left CCA prox dias: 18 cm/s
Left CCA prox sys: 101 cm/s
Left ICA dist dias: -30 cm/s
Left ICA dist sys: -108 cm/s
RCCADSYS: -51 cm/s
RCCAPSYS: 83 cm/s
RIGHT CCA MID DIAS: 20 cm/s
RIGHT ECA DIAS: -8 cm/s
Right CCA prox dias: 15 cm/s

## 2016-04-03 NOTE — Progress Notes (Signed)
[facility] [place]  Patient name: [name] MRN: [id number] DOB: May 17, 1940 Sex: female  REASON FOR VISIT: follow-up   HPI: [name] is a 76 y.o. female who presents for continued follow-up of her carotid stenosis. She is s/p right carotid endarterectomy on 08/08/2014 by Dr. Oneida Alar. Unfortunately, she suffered a perioperative stroke at that time resulting in left arm weakness and numbness. She continues to have residual left sided weakness and numbness stating she is 75% back to normal. She ambulates with a cane.   She denies any episodes of amaurosis fugax, new weakness or numbness of the extremities or slurred speech.  She continues to smoke. She wants to quit but says that her insurance is not providing enough assistance for nicotine patches and chantix for both her and her husband.   She takes ASA and Plavix daily. She has PAD followed by Drs. Verdie Shire. She is on medical management for this. She has CAD, CHF, hypertension and hyperlipidemia all of which are stable.   Past Medical History:  Diagnosis Date  . Acute respiratory failure with hypoxia s/p tracheostomy 07/2013  . Anemia    a. 7-08/2013 felt due to recent critical illness/chronic disease.  . Arthritis    handds, knees & back   . Barrett's esophagus   . CAD (coronary artery disease)    a. Mod dz 2010 initially mgd medically. b. 12/2012 - angina s/p PTCA/DES to mid-circumflex, PTCA/DES to first OM.   Marland Kitchen Carotid artery disease ([place])    a. 60-70% bilat ICA stenosis by dopplers 05/2012.  Marland Kitchen Chronic back pain    deteriorating;DDD  . Colonic obstruction due to suspected colitis; s/p colectomy/colostomy    a. See SBO.  Marland Kitchen Complication of anesthesia    hard to wake up from anesthesia   . COPD (chronic obstructive pulmonary disease) ([place])   . Depression    takes Remeron and Zoloft daily  . Dizziness    was on Bentyl which caused this-took off of it and no problems since  . DVT  of axillary vein, acute right ([place])    a. Dx 07/2013 felt due to R IJ central line that had been inserted 7/14. Not on anticoag due to GIB issues and small cerebral bleed.  Marland Kitchen GERD (gastroesophageal reflux disease)   . Heart murmur   . History of bronchitis    > 55yr ago   . History of colon polyps   . History of hiatal hernia   . History of kidney stones    was told it was stable but doesn't know for sure that she ever passed it  . HTN (hypertension)   . Hypertension    takes Metoprolol daily  . [place]    a. 08/11/13 - per DC summary, tiny SAH vs SDH done for altered mentation, anticoag stopped including aspirin.  . Joint pain   . Neck pain    DDD  . Other and unspecified hyperlipidemia    takes Lipitor daily  . Pneumonia    hx of->15 yrs ago  . Protein-calorie malnutrition, severe w/ electrolyte imbalance    a. Severe hypoalbuminemia leading to 3rd spacing including anasarca and pulm edema 07/2013.  .Marland KitchenSBO (small bowel obstruction)    a. Lysis of adhesions and ovarian cystectomy 04/2013 for sBO. b. Admitted 07/2013 for colonic obstruction due to suspected colitis, s/p partial colectomy/colostomy 08/01/13. Admission complicated by anasarca, acute resp failure requiring tracheostomy, decannulated 08/25/13. c. Recurrent SBO8/2015, NGT  placed for decompression.   . Stroke [facility]) early 80's   right sided weakness--TIA    Family History  Problem Relation Age of Onset  . Varicose Veins Mother   . Heart disease Father   . Heart attack Father   . AAA (abdominal aortic aneurysm) Father   . Heart disease      SOCIAL HISTORY: Social History  Substance Use Topics  . Smoking status: Current Some Day Smoker    Packs/day: 0.50    Years: 53.00    Types: Cigarettes  . Smokeless tobacco: Never Used     Comment: also uses e cig, 4 A DAY  . Alcohol use No    Allergies  Allergen Reactions  . Pregabalin Swelling    Tongue swelling  . Sulfonamide Derivatives Swelling     Childhood reaction    Current Outpatient Prescriptions  Medication Sig Dispense Refill  . aspirin EC 81 MG tablet Take 162 mg by mouth daily.    Marland Kitchen atorvastatin (LIPITOR) 80 MG tablet Take 80 mg by mouth every evening.     Marland Kitchen buPROPion (WELLBUTRIN XL) 150 MG 24 hr tablet Take 1 tablet (150 mg total) by mouth daily. 90 tablet 3  . docusate sodium (COLACE) 100 MG capsule Take 100 mg by mouth 2 (two) times daily.    . fish oil-omega-3 fatty acids 1000 MG capsule Take 1,000 mg by mouth daily.     . folic acid (FOLVITE) 081 MCG tablet Take 800 mcg by mouth daily.     . metoprolol succinate (TOPROL-XL) 25 MG 24 hr tablet Take 1 tablet (25 mg total) by mouth every morning. 90 tablet 0  . mirtazapine (REMERON) 30 MG tablet Take 1 tablet by mouth at bedtime.     . nitroGLYCERIN (NITROSTAT) 0.4 MG SL tablet DISSOLVE 1 TABLET UNDER THE TONGUE EVERY 5 MINUTES AS  NEEDED FOR CHEST PAIN UP TO 3 DOSES, THEN SEEK MEDICAL  ATTENTION 25 tablet 4  . ondansetron (ZOFRAN) 4 MG tablet Take 4 mg by mouth every 8 (eight) hours as needed for nausea or vomiting.     . sertraline (ZOLOFT) 100 MG tablet Take 100 mg by mouth 2 (two) times daily.     No current facility-administered medications for this visit.     REVIEW OF SYSTEMS:  '[X]'$  denotes positive finding, '[ ]'$  denotes negative finding Cardiac  Comments:  Chest pain or chest pressure:    Shortness of breath upon exertion:    Short of breath when lying flat:    Irregular heart rhythm:        Vascular    Pain in calf, thigh, or hip brought on by ambulation:    Pain in feet at night that wakes you up from your sleep:     Blood clot in your veins:    Leg swelling:         Pulmonary    Oxygen at home:    Productive cough:     Wheezing:         Neurologic    Sudden weakness in arms or legs:     Sudden numbness in arms or legs:     Sudden onset of difficulty speaking or slurred speech:    Temporary loss of vision in one eye:     Problems with dizziness:          Gastrointestinal    Blood in stool:     Vomited blood:         Genitourinary  Burning when urinating:     Blood in urine:        Psychiatric    Major depression:         Hematologic    Bleeding problems:    Problems with blood clotting too easily:        Skin    Rashes or ulcers:        Constitutional    Fever or chills:      PHYSICAL EXAM: Vitals:   04/03/16 1331 04/03/16 1334  BP: 128/74 (!) 146/80  Pulse: 83 83  Resp: 16   Temp: 98.9 F (37.2 C)   SpO2: 98%   Weight: 172 lb (78 kg)   Height: 5' (1.524 m)     GENERAL: The patient is a well-nourished female, in no acute distress. The vital signs are documented above. CARDIAC: There is a regular rate and rhythm. No carotid bruits. VASCULAR: 2+ radial pulses bilaterally.  PULMONARY: Non labored respiratory effort. Lungs clear bilaterally.  MUSCULOSKELETAL: There are no major deformities or cyanosis. NEUROLOGIC: Mild residual weakness left upper and lower extremities. Speech is fluent.  SKIN: There are no ulcers or rashes noted. PSYCHIATRIC: The patient has a normal affect.  DATA:  Carotid duplex 04/03/16  Right carotid endarterectomy site is patent with 1-39% diameter reduction Left: 50-69% velocities ICA proximal 183/52. Previously 60-79%  09/2015  MEDICAL ISSUES: Carotid stenosis, bilateral  s/p right carotid endarterectomy  The patient has been asymptomatic without any new neurological symptoms. The patient continues to have residual left sided weakness and numbness from her perioperative stroke s/p right carotid endarterectomy. Her left ICA stenosis velocities are slightly lower than six months ago (previously suggestive of 6079%) Given discrepancy, will have follow up in 6 months with repeat carotid duplex.   She was counseled for greater than three minutes on smoking cessation. Was given a flyer with contact info regarding [place] smoking cessation program. She is on ASA and statin.    Virgina Jock, PA-C [facility] MD: Oneida Alar

## 2016-05-28 DIAGNOSIS — R739 Hyperglycemia, unspecified: Secondary | ICD-10-CM | POA: Diagnosis not present

## 2016-05-28 DIAGNOSIS — J449 Chronic obstructive pulmonary disease, unspecified: Secondary | ICD-10-CM | POA: Diagnosis not present

## 2016-05-28 DIAGNOSIS — M1990 Unspecified osteoarthritis, unspecified site: Secondary | ICD-10-CM | POA: Diagnosis not present

## 2016-05-28 DIAGNOSIS — I10 Essential (primary) hypertension: Secondary | ICD-10-CM | POA: Diagnosis not present

## 2016-05-28 DIAGNOSIS — D539 Nutritional anemia, unspecified: Secondary | ICD-10-CM | POA: Diagnosis not present

## 2016-05-28 DIAGNOSIS — E785 Hyperlipidemia, unspecified: Secondary | ICD-10-CM | POA: Diagnosis not present

## 2016-07-03 ENCOUNTER — Encounter: Payer: Self-pay | Admitting: Cardiology

## 2016-07-18 ENCOUNTER — Ambulatory Visit: Payer: Medicare Other | Admitting: Cardiology

## 2016-08-13 ENCOUNTER — Encounter: Payer: Self-pay | Admitting: *Deleted

## 2016-09-29 DIAGNOSIS — J449 Chronic obstructive pulmonary disease, unspecified: Secondary | ICD-10-CM | POA: Diagnosis not present

## 2016-09-29 DIAGNOSIS — D539 Nutritional anemia, unspecified: Secondary | ICD-10-CM | POA: Diagnosis not present

## 2016-09-29 DIAGNOSIS — I10 Essential (primary) hypertension: Secondary | ICD-10-CM | POA: Diagnosis not present

## 2016-09-29 DIAGNOSIS — R739 Hyperglycemia, unspecified: Secondary | ICD-10-CM | POA: Diagnosis not present

## 2016-09-29 DIAGNOSIS — E785 Hyperlipidemia, unspecified: Secondary | ICD-10-CM | POA: Diagnosis not present

## 2016-09-30 NOTE — Addendum Note (Signed)
Addended by: Lianne Cure A on: 09/30/2016 02:37 PM   Modules accepted: Orders

## 2016-10-06 ENCOUNTER — Ambulatory Visit (HOSPITAL_COMMUNITY)
Admission: RE | Admit: 2016-10-06 | Discharge: 2016-10-06 | Disposition: A | Discharge status: Home / Self Care | Payer: Medicare Other | Source: Ambulatory Visit | Attending: Surgery | Admitting: Surgery

## 2016-10-06 ENCOUNTER — Ambulatory Visit: Payer: Medicare Other

## 2016-10-06 ENCOUNTER — Encounter: Payer: Self-pay | Admitting: Family

## 2016-10-06 ENCOUNTER — Ambulatory Visit (INDEPENDENT_AMBULATORY_CARE_PROVIDER_SITE_OTHER): Payer: Medicare Other | Admitting: Family

## 2016-10-06 VITALS — BP 136/72 | HR 79 | Temp 98.7°F | Resp 20 | Ht 60.0 in | Wt 178.0 lb

## 2016-10-06 DIAGNOSIS — I6523 Occlusion and stenosis of bilateral carotid arteries: Secondary | ICD-10-CM

## 2016-10-06 DIAGNOSIS — F17200 Nicotine dependence, unspecified, uncomplicated: Secondary | ICD-10-CM

## 2016-10-06 DIAGNOSIS — Z9889 Other specified postprocedural states: Secondary | ICD-10-CM | POA: Diagnosis not present

## 2016-10-06 LAB — VAS US CAROTID
LCCADDIAS: -21 cm/s
LCCADSYS: -93 cm/s
LCCAPDIAS: 20 cm/s
LEFT ECA DIAS: -11 cm/s
LICAPDIAS: 70 cm/s
LICAPSYS: 249 cm/s
Left CCA prox sys: 133 cm/s
Left ICA dist dias: -28 cm/s
Left ICA dist sys: -138 cm/s
RIGHT CCA MID DIAS: 23 cm/s
RIGHT ECA DIAS: -14 cm/s
Right CCA prox dias: 19 cm/s
Right CCA prox sys: 165 cm/s
Right cca dist sys: -97 cm/s

## 2016-10-06 NOTE — Progress Notes (Signed)
Chief Complaint: Follow up Extracranial Carotid Artery Stenosis   History of Present Illness  [name] is a 76 y.o. female who is s/p right carotid endarterectomy on 08/08/2014 by Dr. Oneida Alar. Unfortunately, she suffered a perioperative stroke at that time resulting in left arm weakness and numbness. She continues to have residual left sided weakness and numbness stating she is 75% back to normal. She ambulates with a cane.   She denies any episodes of amaurosis fugax, new weakness or numbness of the extremities or slurred speech.  She has PAD followed by Drs. Verdie Shire. She is on medical management for this. She has CAD, CHF, hypertension and hyperlipidemia all of which are stable.   Pt Diabetic: no Pt smoker: smoker  (1-2 cigarettes/day, started at age 10 yrs, quit a couple of times)  Pt meds include: Statin : yes ASA: yes Other anticoagulants/antiplatelets: no    Past Medical History:  Diagnosis Date  . Acute respiratory failure with hypoxia s/p tracheostomy 07/2013  . Anemia    a. 7-08/2013 felt due to recent critical illness/chronic disease.  . Arthritis    handds, knees & back   . Barrett's esophagus   . CAD (coronary artery disease)    a. Mod dz 2010 initially mgd medically. b. 12/2012 - angina s/p PTCA/DES to mid-circumflex, PTCA/DES to first OM.   Marland Kitchen Carotid artery disease ([place])    a. 60-70% bilat ICA stenosis by dopplers 05/2012.  Marland Kitchen Chronic back pain    deteriorating;DDD  . Colonic obstruction due to suspected colitis; s/p colectomy/colostomy    a. See SBO.  Marland Kitchen Complication of anesthesia    hard to wake up from anesthesia   . COPD (chronic obstructive pulmonary disease) ([place])   . Depression    takes Remeron and Zoloft daily  . Dizziness    was on Bentyl which caused this-took off of it and no problems since  . DVT of axillary vein, acute right ([place])    a. Dx 07/2013 felt due to R IJ central line that had been inserted 7/14. Not on anticoag due to  GIB issues and small cerebral bleed.  Marland Kitchen GERD (gastroesophageal reflux disease)   . Heart murmur   . History of bronchitis    > 75yrs ago   . History of colon polyps   . History of hiatal hernia   . History of kidney stones    was told it was stable but doesn't know for sure that she ever passed it  . HTN (hypertension)   . Hypertension    takes Metoprolol daily  . Intracranial hemorrhage ([place])    a. 08/11/13 - per DC summary, tiny SAH vs SDH done for altered mentation, anticoag stopped including aspirin.  . Joint pain   . Neck pain    DDD  . Other and unspecified hyperlipidemia    takes Lipitor daily  . Pneumonia    hx of->15 yrs ago  . Protein-calorie malnutrition, severe w/ electrolyte imbalance    a. Severe hypoalbuminemia leading to 3rd spacing including anasarca and pulm edema 07/2013.  Marland Kitchen SBO (small bowel obstruction) ([place])    a. Lysis of adhesions and ovarian cystectomy 04/2013 for sBO. b. Admitted 07/2013 for colonic obstruction due to suspected colitis, s/p partial colectomy/colostomy 08/01/13. Admission complicated by anasarca, acute resp failure requiring tracheostomy, decannulated 08/25/13. c. Recurrent SBO8/2015, NGT placed for decompression.   . Stroke [facility]) early 60's   right sided weakness--TIA    Social History Social History  Substance Use Topics  . Smoking status: Current Some Day Smoker    Packs/day: 0.50    Years: 53.00    Types: Cigarettes  . Smokeless tobacco: Never Used     Comment: also uses e cig, 4 A DAY  . Alcohol use No    Family History Family History  Problem Relation Age of Onset  . Varicose Veins Mother   . Heart disease Father   . Heart attack Father   . AAA (abdominal aortic aneurysm) Father   . Heart disease Unknown     Surgical History Past Surgical History:  Procedure Laterality Date  . ABDOMINAL HYSTERECTOMY    . APPENDECTOMY    . BOWEL RESECTION N/A 08/01/2013   Procedure: SMALL BOWEL RESECTION;  Surgeon: Harl Bowie, MD;   Location: [place];  Service: General;  Laterality: N/A;  . cataract surgery Bilateral   . CHOLECYSTECTOMY    . COLON RESECTION    . COLON SURGERY    . COLOSTOMY N/A 08/01/2013   Procedure: COLOSTOMY;  Surgeon: Harl Bowie, MD;  Location: [place];  Service: General;  Laterality: N/A;  . COLOSTOMY TAKEDOWN  01/24/2014   dr blackman  . COLOSTOMY TAKEDOWN N/A 01/24/2014   Procedure: COLOSTOMY TAKEDOWN;  Surgeon: Coralie Keens, MD;  Location: [place];  Service: General;  Laterality: N/A;  . CORONARY ANGIOPLASTY WITH STENT PLACEMENT  12/20/2012   STENT TO OM         DR COOPER  . ENDARTERECTOMY Right 08/08/2014   Procedure: RIGHT CAROTID ENDARTERECTOMY WITH HEMASHIELD PATCH ANGIOPLASTY;  Surgeon: Elam Dutch, MD;  Location: [place];  Service: Vascular;  Laterality: Right;  . FOOT SURGERY Left   . HAND SURGERY Bilateral    for ganglion cysts  . LAPAROTOMY N/A 08/01/2013   Procedure: EXPLORATORY LAPAROTOMY;  Surgeon: Harl Bowie, MD;  Location: [place];  Service: General;  Laterality: N/A;  . LEFT HEART CATHETERIZATION WITH CORONARY ANGIOGRAM N/A 12/20/2012   Procedure: LEFT HEART CATHETERIZATION WITH CORONARY ANGIOGRAM;  Surgeon: Blane Ohara, MD;  Location[facility];  Service: Cardiovascular;  Laterality: N/A;  . PARTIAL COLECTOMY N/A 08/01/2013   Procedure: PARTIAL COLECTOMY;  Surgeon: Harl Bowie, MD;  Location: [place];  Service: General;  Laterality: N/A;  . ROTATOR CUFF REPAIR Left   . TONSILLECTOMY    . TRACHEOSTOMY     feinstein    Allergies  Allergen Reactions  . Pregabalin Swelling    Tongue swelling  . Sulfonamide Derivatives Swelling    Childhood reaction    Current Outpatient Prescriptions  Medication Sig Dispense Refill  . aspirin EC 81 MG tablet Take 162 mg by mouth daily.    Marland Kitchen atorvastatin (LIPITOR) 80 MG tablet Take 80 mg by mouth every evening.     Marland Kitchen buPROPion (WELLBUTRIN XL) 150 MG 24 hr tablet Take 1 tablet (150 mg total) by mouth daily. 90  tablet 3  . docusate sodium (COLACE) 100 MG capsule Take 100 mg by mouth 2 (two) times daily.    . fish oil-omega-3 fatty acids 1000 MG capsule Take 1,000 mg by mouth daily.     . folic acid (FOLVITE) 767 MCG tablet Take 800 mcg by mouth daily.     . metoprolol succinate (TOPROL-XL) 25 MG 24 hr tablet Take 1 tablet (25 mg total) by mouth every morning. 90 tablet 0  . mirtazapine (REMERON) 30 MG tablet Take 1 tablet by mouth at bedtime.     . nitroGLYCERIN (  NITROSTAT) 0.4 MG SL tablet DISSOLVE 1 TABLET UNDER THE TONGUE EVERY 5 MINUTES AS  NEEDED FOR CHEST PAIN UP TO 3 DOSES, THEN SEEK MEDICAL  ATTENTION 25 tablet 4  . ondansetron (ZOFRAN) 4 MG tablet Take 4 mg by mouth every 8 (eight) hours as needed for nausea or vomiting.     . sertraline (ZOLOFT) 100 MG tablet Take 100 mg by mouth 2 (two) times daily.     No current facility-administered medications for this visit.     Review of Systems : See HPI for pertinent positives and negatives.  Physical Examination  Vitals:   10/06/16 1126 10/06/16 1127  BP: 129/77 136/72  Pulse: 79   Resp: 20   Temp: 98.7 F (37.1 C)   TempSrc: Oral   SpO2: 96%   Weight: 178 lb (80.7 kg)   Height: 5' (1.524 m)    Body mass index is 34.76 kg/m.  General: WDWN obese female in NAD GAIT: using cane, mild left side weakness Eyes: PERRLA Pulmonary:  Respirations are non-labored, adequate air movement, + scattered rales left posterior fields, no rhonchi or wheezes.  Cardiac: regular rhythm, no detected murmur.  VASCULAR EXAM Carotid Bruits Right Left   Negative  Negative      Abdominal aortic pulse is not palpable. Radial pulses are 2+ palpable and equal.                                                                                                                                          LE Pulses Right Left       POPLITEAL  not palpable   not palpable       POSTERIOR TIBIAL  not palpable   not palpable        DORSALIS PEDIS       ANTERIOR TIBIAL faintly palpable  faintly palpable     Gastrointestinal: soft, nontender, BS WNL, no r/g, no palpable masses.  Musculoskeletal: No muscle atrophy/wasting. M/S 5/5  In her right upper and lower extremites, 3/5 in her left upper extremity, and 4/5 in left lower extremity, extremities without ischemic changes.  Neurologic: A&O X 3; appropriate affect, speech is normal, CN 2-12 intact, pain and light touch intact in extremities, Motor exam as listed above.     Assessment: [name] is a 76 y.o. female who is s/p right CEA on 08/08/2014. She had a right CVA (embolosim of right cerebral artery), pt states during the surgery, with left upper and lower extremity weakness. She has made progress; improved facial droop, and left leg strength.  She denies any subsequent stroke or TIA.  Her atherosclerotic risk factors include continued smoking, CAD, COPD, and obesity.    DATA Carotid Duplex (10/06/16): Right ICA:  CEA site with 1-39% stenosis. Left ICA: 60-79% stenosis. There is a loop in the proximal left ICA.  Velocities may be due to tortuosity.  No significant change compared to exam of 03/01/15 and 04/03/16.  Plan:  The patient was counseled re smoking cessation and given several free resources re smoking cessation.   Follow-up in 6 months with Carotid Duplex scan    I discussed in depth with the patient the nature of atherosclerosis, and emphasized the importance of maximal medical management including strict control of blood pressure, blood glucose, and lipid levels, obtaining regular exercise, and cessation of smoking.  The patient is aware that without maximal medical management the underlying atherosclerotic disease process will progress, limiting the benefit of any interventions. The patient was given information about stroke prevention and what symptoms should prompt the patient to seek immediate medical care. Thank you for allowing [place] to participate  in this patient's care.  Clemon Chambers, RN, MSN, FNP-C [facility] Office: [facility] Physician: Trula Slade  10/06/16 11:37 AM

## 2016-10-06 NOTE — Patient Instructions (Signed)
Steps to Quit Smoking Smoking tobacco can be bad for your health. It can also affect almost every organ in your body. Smoking puts you and people around you at risk for many serious long-lasting (chronic) diseases. Quitting smoking is hard, but it is one of the best things that you can do for your health. It is never too late to quit. What are the benefits of quitting smoking? When you quit smoking, you lower your risk for getting serious diseases and conditions. They can include:  Lung cancer or lung disease.  Heart disease.  Stroke.  Heart attack.  Not being able to have children (infertility).  Weak bones (osteoporosis) and broken bones (fractures).  If you have coughing, wheezing, and shortness of breath, those symptoms may get better when you quit. You may also get sick less often. If you are pregnant, quitting smoking can help to lower your chances of having a baby of low birth weight. What can I do to help me quit smoking? Talk with your doctor about what can help you quit smoking. Some things you can do (strategies) include:  Quitting smoking totally, instead of slowly cutting back how much you smoke over a period of time.  Going to in-person counseling. You are more likely to quit if you go to many counseling sessions.  Using resources and support systems, such as: ? Online chats with a counselor. ? Phone quitlines. ? Printed self-help materials. ? Support groups or group counseling. ? Text messaging programs. ? Mobile phone apps or applications.  Taking medicines. Some of these medicines may have nicotine in them. If you are pregnant or breastfeeding, do not take any medicines to quit smoking unless your doctor says it is okay. Talk with your doctor about counseling or other things that can help you.  Talk with your doctor about using more than one strategy at the same time, such as taking medicines while you are also going to in-person counseling. This can help make  quitting easier. What things can I do to make it easier to quit? Quitting smoking might feel very hard at first, but there is a lot that you can do to make it easier. Take these steps:  Talk to your family and friends. Ask them to support and encourage you.  Call phone quitlines, reach out to support groups, or work with a counselor.  Ask people who smoke to not smoke around you.  Avoid places that make you want (trigger) to smoke, such as: ? Bars. ? Parties. ? Smoke-break areas at work.  Spend time with people who do not smoke.  Lower the stress in your life. Stress can make you want to smoke. Try these things to help your stress: ? Getting regular exercise. ? Deep-breathing exercises. ? Yoga. ? Meditating. ? Doing a body scan. To do this, close your eyes, focus on one area of your body at a time from head to toe, and notice which parts of your body are tense. Try to relax the muscles in those areas.  Download or buy apps on your mobile phone or tablet that can help you stick to your quit plan. There are many free apps, such as QuitGuide from the CDC (Centers for Disease Control and Prevention). You can find more support from smokefree.gov and other websites.  This information is not intended to replace advice given to you by your health care provider. Make sure you discuss any questions you have with your health care provider. Document Released: 11/02/2008 Document   Revised: 09/04/2015 Document Reviewed: 05/23/2014 Elsevier Interactive Patient Education  2018 Elsevier Inc.     Stroke Prevention Some medical conditions and behaviors are associated with an increased chance of having a stroke. You may prevent a stroke by making healthy choices and managing medical conditions. How can I reduce my risk of having a stroke?  Stay physically active. Get at least 30 minutes of activity on most or all days.  Do not smoke. It may also be helpful to avoid exposure to secondhand  smoke.  Limit alcohol use. Moderate alcohol use is considered to be: ? No more than 2 drinks per day for men. ? No more than 1 drink per day for nonpregnant women.  Eat healthy foods. This involves: ? Eating 5 or more servings of fruits and vegetables a day. ? Making dietary changes that address high blood pressure (hypertension), high cholesterol, diabetes, or obesity.  Manage your cholesterol levels. ? Making food choices that are high in fiber and low in saturated fat, trans fat, and cholesterol may control cholesterol levels. ? Take any prescribed medicines to control cholesterol as directed by your health care provider.  Manage your diabetes. ? Controlling your carbohydrate and sugar intake is recommended to manage diabetes. ? Take any prescribed medicines to control diabetes as directed by your health care provider.  Control your hypertension. ? Making food choices that are low in salt (sodium), saturated fat, trans fat, and cholesterol is recommended to manage hypertension. ? Ask your health care provider if you need treatment to lower your blood pressure. Take any prescribed medicines to control hypertension as directed by your health care provider. ? If you are 18-39 years of age, have your blood pressure checked every 3-5 years. If you are 40 years of age or older, have your blood pressure checked every year.  Maintain a healthy weight. ? Reducing calorie intake and making food choices that are low in sodium, saturated fat, trans fat, and cholesterol are recommended to manage weight.  Stop drug abuse.  Avoid taking birth control pills. ? Talk to your health care provider about the risks of taking birth control pills if you are over 35 years old, smoke, get migraines, or have ever had a blood clot.  Get evaluated for sleep disorders (sleep apnea). ? Talk to your health care provider about getting a sleep evaluation if you snore a lot or have excessive sleepiness.  Take  medicines only as directed by your health care provider. ? For some people, aspirin or blood thinners (anticoagulants) are helpful in reducing the risk of forming abnormal blood clots that can lead to stroke. If you have the irregular heart rhythm of atrial fibrillation, you should be on a blood thinner unless there is a good reason you cannot take them. ? Understand all your medicine instructions.  Make sure that other conditions (such as anemia or atherosclerosis) are addressed. Get help right away if:  You have sudden weakness or numbness of the face, arm, or leg, especially on one side of the body.  Your face or eyelid droops to one side.  You have sudden confusion.  You have trouble speaking (aphasia) or understanding.  You have sudden trouble seeing in one or both eyes.  You have sudden trouble walking.  You have dizziness.  You have a loss of balance or coordination.  You have a sudden, severe headache with no known cause.  You have new chest pain or an irregular heartbeat. Any of these symptoms may represent   a serious problem that is an emergency. Do not wait to see if the symptoms will go away. Get medical help at once. Call your local emergency services (911 in [place] Do not drive yourself to the hospital. This information is not intended to replace advice given to you by your health care provider. Make sure you discuss any questions you have with your health care provider. Document Released: 02/14/2004 Document Revised: 06/14/2015 Document Reviewed: 07/09/2012 Elsevier Interactive Patient Education  2017 Elsevier Inc.  

## 2016-10-16 NOTE — Addendum Note (Signed)
Addended by: Lianne Cure A on: 10/16/2016 03:40 PM   Modules accepted: Orders

## 2016-11-28 DIAGNOSIS — Z23 Encounter for immunization: Secondary | ICD-10-CM | POA: Diagnosis not present

## 2017-01-29 DIAGNOSIS — I10 Essential (primary) hypertension: Secondary | ICD-10-CM | POA: Diagnosis not present

## 2017-01-29 DIAGNOSIS — E785 Hyperlipidemia, unspecified: Secondary | ICD-10-CM | POA: Diagnosis not present

## 2017-01-29 DIAGNOSIS — Z1331 Encounter for screening for depression: Secondary | ICD-10-CM | POA: Diagnosis not present

## 2017-01-29 DIAGNOSIS — D539 Nutritional anemia, unspecified: Secondary | ICD-10-CM | POA: Diagnosis not present

## 2017-01-29 DIAGNOSIS — S46919A Strain of unspecified muscle, fascia and tendon at shoulder and upper arm level, unspecified arm, initial encounter: Secondary | ICD-10-CM | POA: Diagnosis not present

## 2017-02-12 DIAGNOSIS — N959 Unspecified menopausal and perimenopausal disorder: Secondary | ICD-10-CM | POA: Diagnosis not present

## 2017-02-12 DIAGNOSIS — Z9181 History of falling: Secondary | ICD-10-CM | POA: Diagnosis not present

## 2017-02-12 DIAGNOSIS — Z0000 Encounter for general adult medical examination without abnormal findings: Secondary | ICD-10-CM | POA: Diagnosis not present

## 2017-02-12 DIAGNOSIS — Z136 Encounter for screening for cardiovascular disorders: Secondary | ICD-10-CM | POA: Diagnosis not present

## 2017-02-12 DIAGNOSIS — E785 Hyperlipidemia, unspecified: Secondary | ICD-10-CM | POA: Diagnosis not present

## 2017-02-12 DIAGNOSIS — Z23 Encounter for immunization: Secondary | ICD-10-CM | POA: Diagnosis not present

## 2017-02-12 DIAGNOSIS — Z139 Encounter for screening, unspecified: Secondary | ICD-10-CM | POA: Diagnosis not present

## 2017-02-12 DIAGNOSIS — Z1331 Encounter for screening for depression: Secondary | ICD-10-CM | POA: Diagnosis not present

## 2017-02-12 DIAGNOSIS — Z1231 Encounter for screening mammogram for malignant neoplasm of breast: Secondary | ICD-10-CM | POA: Diagnosis not present

## 2017-04-06 ENCOUNTER — Ambulatory Visit (INDEPENDENT_AMBULATORY_CARE_PROVIDER_SITE_OTHER): Payer: Medicare Other | Admitting: Family

## 2017-04-06 ENCOUNTER — Ambulatory Visit (HOSPITAL_COMMUNITY)
Admission: RE | Admit: 2017-04-06 | Discharge: 2017-04-06 | Disposition: A | Discharge status: Home / Self Care | Payer: Medicare Other | Source: Ambulatory Visit | Attending: Family | Admitting: Family

## 2017-04-06 ENCOUNTER — Encounter: Payer: Self-pay | Admitting: Family

## 2017-04-06 VITALS — BP 162/87 | HR 82 | Temp 98.3°F | Resp 17 | Ht 60.0 in | Wt 184.0 lb

## 2017-04-06 DIAGNOSIS — I6523 Occlusion and stenosis of bilateral carotid arteries: Secondary | ICD-10-CM | POA: Diagnosis not present

## 2017-04-06 DIAGNOSIS — Z8673 Personal history of transient ischemic attack (TIA), and cerebral infarction without residual deficits: Secondary | ICD-10-CM | POA: Insufficient documentation

## 2017-04-06 DIAGNOSIS — F17200 Nicotine dependence, unspecified, uncomplicated: Secondary | ICD-10-CM

## 2017-04-06 DIAGNOSIS — I2510 Atherosclerotic heart disease of native coronary artery without angina pectoris: Secondary | ICD-10-CM | POA: Insufficient documentation

## 2017-04-06 DIAGNOSIS — Z9889 Other specified postprocedural states: Secondary | ICD-10-CM

## 2017-04-06 DIAGNOSIS — I10 Essential (primary) hypertension: Secondary | ICD-10-CM | POA: Diagnosis not present

## 2017-04-06 DIAGNOSIS — E785 Hyperlipidemia, unspecified: Secondary | ICD-10-CM | POA: Diagnosis not present

## 2017-04-06 NOTE — Progress Notes (Signed)
Chief Complaint: Follow up Extracranial Carotid Artery Stenosis   History of Present Illness  [name] is a 77 y.o. female who is s/p right carotid endarterectomy on 08/08/2014 by Dr. Oneida Alar. Unfortunately, she suffered a perioperative stroke at that time resulting in left arm weakness and numbness. She continues to have residual left sided weakness and numbness stating she is 75% back to normal. She ambulates with a cane.   She denies any episodes of amaurosis fugax, new weakness or numbness of the extremities or slurred speech.  She has PAD followed by Drs. Verdie Shire. She is on medical management for this. She has CAD, CHF, hypertension and hyperlipidemia all of which are stable.   Pt Diabetic: no Pt smoker: smoker (1/2 ppd, started at age 30yrs, quit a couple of times)  Pt meds include: Statin : yes ASA: yes Other anticoagulants/antiplatelets: no    Past Medical History:  Diagnosis Date  . Acute respiratory failure with hypoxia s/p tracheostomy 07/2013  . Anemia    a. 7-08/2013 felt due to recent critical illness/chronic disease.  . Arthritis    handds, knees & back   . Barrett's esophagus   . CAD (coronary artery disease)    a. Mod dz 2010 initially mgd medically. b. 12/2012 - angina s/p PTCA/DES to mid-circumflex, PTCA/DES to first OM.   Marland Kitchen Carotid artery disease ([place])    a. 60-70% bilat ICA stenosis by dopplers 05/2012.  Marland Kitchen Chronic back pain    deteriorating;DDD  . Colonic obstruction due to suspected colitis; s/p colectomy/colostomy    a. See SBO.  Marland Kitchen Complication of anesthesia    hard to wake up from anesthesia   . COPD (chronic obstructive pulmonary disease) ([place])   . Depression    takes Remeron and Zoloft daily  . Dizziness    was on Bentyl which caused this-took off of it and no problems since  . DVT of axillary vein, acute right ([place])    a. Dx 07/2013 felt due to R IJ central line that had been inserted 7/14. Not on anticoag due to GIB  issues and small cerebral bleed.  Marland Kitchen GERD (gastroesophageal reflux disease)   . Heart murmur   . History of bronchitis    > 28yrs ago   . History of colon polyps   . History of hiatal hernia   . History of kidney stones    was told it was stable but doesn't know for sure that she ever passed it  . HTN (hypertension)   . Hypertension    takes Metoprolol daily  . Intracranial hemorrhage ([place])    a. 08/11/13 - per DC summary, tiny SAH vs SDH done for altered mentation, anticoag stopped including aspirin.  . Joint pain   . Neck pain    DDD  . Other and unspecified hyperlipidemia    takes Lipitor daily  . Pneumonia    hx of->15 yrs ago  . Protein-calorie malnutrition, severe w/ electrolyte imbalance    a. Severe hypoalbuminemia leading to 3rd spacing including anasarca and pulm edema 07/2013.  Marland Kitchen SBO (small bowel obstruction) ([place])    a. Lysis of adhesions and ovarian cystectomy 04/2013 for sBO. b. Admitted 07/2013 for colonic obstruction due to suspected colitis, s/p partial colectomy/colostomy 08/01/13. Admission complicated by anasarca, acute resp failure requiring tracheostomy, decannulated 08/25/13. c. Recurrent SBO8/2015, NGT placed for decompression.   . Stroke [facility]) early 28's   right sided weakness--TIA    Social History Social History  Tobacco Use  . Smoking status: Current Some Day Smoker    Packs/day: 0.25    Years: 53.00    Pack years: 13.25    Types: Cigarettes  . Smokeless tobacco: Never Used  . Tobacco comment: also uses e cig, 4 A DAY  Substance Use Topics  . Alcohol use: No    Alcohol/week: 0.0 oz  . Drug use: No    Family History Family History  Problem Relation Age of Onset  . Varicose Veins Mother   . Heart disease Father   . Heart attack Father   . AAA (abdominal aortic aneurysm) Father   . Heart disease Unknown     Surgical History Past Surgical History:  Procedure Laterality Date  . ABDOMINAL HYSTERECTOMY    . APPENDECTOMY    . BOWEL RESECTION  N/A 08/01/2013   Procedure: SMALL BOWEL RESECTION;  Surgeon: Harl Bowie, MD;  Location: [place];  Service: General;  Laterality: N/A;  . cataract surgery Bilateral   . CHOLECYSTECTOMY    . COLON RESECTION    . COLON SURGERY    . COLOSTOMY N/A 08/01/2013   Procedure: COLOSTOMY;  Surgeon: Harl Bowie, MD;  Location: [place];  Service: General;  Laterality: N/A;  . COLOSTOMY TAKEDOWN  01/24/2014   dr blackman  . COLOSTOMY TAKEDOWN N/A 01/24/2014   Procedure: COLOSTOMY TAKEDOWN;  Surgeon: Coralie Keens, MD;  Location: [place];  Service: General;  Laterality: N/A;  . CORONARY ANGIOPLASTY WITH STENT PLACEMENT  12/20/2012   STENT TO OM         DR COOPER  . ENDARTERECTOMY Right 08/08/2014   Procedure: RIGHT CAROTID ENDARTERECTOMY WITH HEMASHIELD PATCH ANGIOPLASTY;  Surgeon: Elam Dutch, MD;  Location: [place];  Service: Vascular;  Laterality: Right;  . FOOT SURGERY Left   . HAND SURGERY Bilateral    for ganglion cysts  . LAPAROTOMY N/A 08/01/2013   Procedure: EXPLORATORY LAPAROTOMY;  Surgeon: Harl Bowie, MD;  Location: [place];  Service: General;  Laterality: N/A;  . LEFT HEART CATHETERIZATION WITH CORONARY ANGIOGRAM N/A 12/20/2012   Procedure: LEFT HEART CATHETERIZATION WITH CORONARY ANGIOGRAM;  Surgeon: Blane Ohara, MD;  Location: [facility] CATH LAB;  Service: Cardiovascular;  Laterality: N/A;  . PARTIAL COLECTOMY N/A 08/01/2013   Procedure: PARTIAL COLECTOMY;  Surgeon: Harl Bowie, MD;  Location: [place];  Service: General;  Laterality: N/A;  . ROTATOR CUFF REPAIR Left   . TONSILLECTOMY    . TRACHEOSTOMY     feinstein    Allergies  Allergen Reactions  . Pregabalin Swelling    Tongue swelling  . Sulfonamide Derivatives Swelling    Childhood reaction    Current Outpatient Medications  Medication Sig Dispense Refill  . aspirin EC 81 MG tablet Take 162 mg by mouth daily.    Marland Kitchen atorvastatin (LIPITOR) 80 MG tablet Take 80 mg by mouth every evening.     Marland Kitchen buPROPion  (WELLBUTRIN XL) 150 MG 24 hr tablet Take 1 tablet (150 mg total) by mouth daily. 90 tablet 3  . docusate sodium (COLACE) 100 MG capsule Take 100 mg by mouth 2 (two) times daily.    . fish oil-omega-3 fatty acids 1000 MG capsule Take 1,000 mg by mouth daily.     . folic acid (FOLVITE) 631 MCG tablet Take 800 mcg by mouth daily.     . metoprolol succinate (TOPROL-XL) 25 MG 24 hr tablet Take 1 tablet (25 mg total) by mouth every morning. 90 tablet 0  .  mirtazapine (REMERON) 30 MG tablet Take 1 tablet by mouth at bedtime.     . nitroGLYCERIN (NITROSTAT) 0.4 MG SL tablet DISSOLVE 1 TABLET UNDER THE TONGUE EVERY 5 MINUTES AS  NEEDED FOR CHEST PAIN UP TO 3 DOSES, THEN SEEK MEDICAL  ATTENTION 25 tablet 4  . ondansetron (ZOFRAN) 4 MG tablet Take 4 mg by mouth every 8 (eight) hours as needed for nausea or vomiting.     Marland Kitchen oxyCODONE (OXY IR/ROXICODONE) 5 MG immediate release tablet     . pantoprazole (PROTONIX) 40 MG tablet     . sertraline (ZOLOFT) 100 MG tablet Take 100 mg by mouth 2 (two) times daily.     No current facility-administered medications for this visit.     Review of Systems : See HPI for pertinent positives and negatives.  Physical Examination  Vitals:   04/06/17 1334 04/06/17 1336  BP: (!) 146/82 (!) 162/87  Pulse: 82   Resp: 17   Temp: 98.3 F (36.8 C)   TempSrc: Oral   SpO2: 95%   Weight: 184 lb (83.5 kg)   Height: 5' (1.524 m)    Body mass index is 35.94 kg/m.  General: WDWN obese femalein NAD GAIT:using cane, mild left side weakness HENT: No gross abnormalities  Eyes: PERRLA Pulmonary: Respirations are non-labored, adequate air movement in all fields, no rales, rhonchi, or wheezes. Cardiac: regularrhythm, nodetected murmur.  VASCULAR EXAM Carotid Bruits Right Left   Negative  Negative    Abdominal aortic pulse is notpalpable. Radial pulses are 2+palpable and equal.   LE Pulses Right Left  POPLITEAL notpalpable   notpalpable  POSTERIOR TIBIAL notpalpable  notpalpable   DORSALIS PEDIS ANTERIOR TIBIAL Not palpable  not palpable     Gastrointestinal: soft, nontender, BS WNL, no r/g, nopalpable masses. Musculoskeletal: Nomuscle atrophy/wasting. M/S 5/5 In her right upper and lower extremites, 3/5 in her left upper extremity, and 4/5 in left lower extremity,extremities without ischemic changes. Skin: No rashes, no ulcers, no cellulitis.   Neurologic:  A&O X 3; appropriate affect, sensation is normal; speech is normal, CN 2-12 intact, pain and light touch intact in extremities, motor exam as listed above. Psychiatric: Normal thought content, mood appropriate to clinical situation.       Assessment: [name] is a 77 y.o. female who is s/p right CEA on 08/08/2014. She had a right CVA (embolosim of right cerebral artery), pt states during the surgery, with left upper and lower extremity weakness. She has madeprogress; improved facial droop, and left leg strength.  She denies any subsequent stroke or TIA.   Her atherosclerotic risk factors include continued smoking, CAD, COPD, and obesity.   She dances with her husband socially on a regular basis.   DATA  Carotid Duplex (04/06/17): Right ICA: CEA site, 1-39% stenosis Left ICA: 60-79% stenosis Bilateral vertebral artery flow is antegrade.  Bilateral subclavian artery waveforms are normal.  No significant change compared to the exams on 03-01-15, 04-03-16, and 10-06-16.    Plan:  The patient was counseled re smoking cessation and given several free resources re smoking cessation.  Follow-up in 6 monthswith Carotid Duplex scan.  I discussed in depth with the patient the nature of atherosclerosis, and emphasized the importance of maximal medical management including strict control of blood pressure, blood glucose, and lipid levels, obtaining regular exercise, and cessation of smoking.  The patient is  aware that without maximal medical management the underlying atherosclerotic disease process will progress, limiting the benefit of any interventions.  The patient was given information about stroke prevention and what symptoms should prompt the patient to seek immediate medical care. Thank you for allowing [place] to participate in this patient's care.  Clemon Chambers, RN, MSN, FNP-C [facility] Office: [facility] Physician: Trula Slade  04/06/17 2:16 PM

## 2017-04-06 NOTE — Patient Instructions (Signed)
Steps to Quit Smoking Smoking tobacco can be bad for your health. It can also affect almost every organ in your body. Smoking puts you and people around you at risk for many serious long-lasting (chronic) diseases. Quitting smoking is hard, but it is one of the best things that you can do for your health. It is never too late to quit. What are the benefits of quitting smoking? When you quit smoking, you lower your risk for getting serious diseases and conditions. They can include:  Lung cancer or lung disease.  Heart disease.  Stroke.  Heart attack.  Not being able to have children (infertility).  Weak bones (osteoporosis) and broken bones (fractures).  If you have coughing, wheezing, and shortness of breath, those symptoms may get better when you quit. You may also get sick less often. If you are pregnant, quitting smoking can help to lower your chances of having a baby of low birth weight. What can I do to help me quit smoking? Talk with your doctor about what can help you quit smoking. Some things you can do (strategies) include:  Quitting smoking totally, instead of slowly cutting back how much you smoke over a period of time.  Going to in-person counseling. You are more likely to quit if you go to many counseling sessions.  Using resources and support systems, such as: ? Online chats with a counselor. ? Phone quitlines. ? Printed self-help materials. ? Support groups or group counseling. ? Text messaging programs. ? Mobile phone apps or applications.  Taking medicines. Some of these medicines may have nicotine in them. If you are pregnant or breastfeeding, do not take any medicines to quit smoking unless your doctor says it is okay. Talk with your doctor about counseling or other things that can help you.  Talk with your doctor about using more than one strategy at the same time, such as taking medicines while you are also going to in-person counseling. This can help make  quitting easier. What things can I do to make it easier to quit? Quitting smoking might feel very hard at first, but there is a lot that you can do to make it easier. Take these steps:  Talk to your family and friends. Ask them to support and encourage you.  Call phone quitlines, reach out to support groups, or work with a counselor.  Ask people who smoke to not smoke around you.  Avoid places that make you want (trigger) to smoke, such as: ? Bars. ? Parties. ? Smoke-break areas at work.  Spend time with people who do not smoke.  Lower the stress in your life. Stress can make you want to smoke. Try these things to help your stress: ? Getting regular exercise. ? Deep-breathing exercises. ? Yoga. ? Meditating. ? Doing a body scan. To do this, close your eyes, focus on one area of your body at a time from head to toe, and notice which parts of your body are tense. Try to relax the muscles in those areas.  Download or buy apps on your mobile phone or tablet that can help you stick to your quit plan. There are many free apps, such as QuitGuide from the CDC (Centers for Disease Control and Prevention). You can find more support from smokefree.gov and other websites.  This information is not intended to replace advice given to you by your health care provider. Make sure you discuss any questions you have with your health care provider. Document Released: 11/02/2008 Document   Revised: 09/04/2015 Document Reviewed: 05/23/2014 Elsevier Interactive Patient Education  2018 Elsevier Inc.     Stroke Prevention Some health problems and behaviors may make it more likely for you to have a stroke. Below are ways to lessen your risk of having a stroke.  Be active for at least 30 minutes on most or all days.  Do not smoke. Try not to be around others who smoke.  Do not drink too much alcohol. ? Do not have more than 2 drinks a day if you are a man. ? Do not have more than 1 drink a day if you  are a woman and are not pregnant.  Eat healthy foods, such as fruits and vegetables. If you were put on a specific diet, follow the diet as told.  Keep your cholesterol levels under control through diet and medicines. Look for foods that are low in saturated fat, trans fat, cholesterol, and are high in fiber.  If you have diabetes, follow all diet plans and take your medicine as told.  Ask your doctor if you need treatment to lower your blood pressure. If you have high blood pressure (hypertension), follow all diet plans and take your medicine as told by your doctor.  If you are 18-39 years old, have your blood pressure checked every 3-5 years. If you are age 40 or older, have your blood pressure checked every year.  Keep a healthy weight. Eat foods that are low in calories, salt, saturated fat, trans fat, and cholesterol.  Do not take drugs.  Avoid birth control pills, if this applies. Talk to your doctor about the risks of taking birth control pills.  Talk to your doctor if you have sleep problems (sleep apnea).  Take all medicine as told by your doctor. ? You may be told to take aspirin or blood thinner medicine. Take this medicine as told by your doctor. ? Understand your medicine instructions.  Make sure any other conditions you have are being taken care of.  Get help right away if:  You suddenly lose feeling (you feel numb) or have weakness in your face, arm, or leg.  Your face or eyelid hangs down to one side.  You suddenly feel confused.  You have trouble talking (aphasia) or understanding what people are saying.  You suddenly have trouble seeing in one or both eyes.  You suddenly have trouble walking.  You are dizzy.  You lose your balance or your movements are clumsy (uncoordinated).  You suddenly have a very bad headache and you do not know the cause.  You have new chest pain.  Your heart feels like it is fluttering or skipping a beat (irregular  heartbeat). Do not wait to see if the symptoms above go away. Get help right away. Call your local emergency services (911 in [place] Do not drive yourself to the hospital. This information is not intended to replace advice given to you by your health care provider. Make sure you discuss any questions you have with your health care provider. Document Released: 07/08/2011 Document Revised: 06/14/2015 Document Reviewed: 07/09/2012 Elsevier Interactive Patient Education  2018 Elsevier Inc.  

## 2017-04-08 ENCOUNTER — Other Ambulatory Visit: Payer: Self-pay

## 2017-04-08 DIAGNOSIS — I6529 Occlusion and stenosis of unspecified carotid artery: Secondary | ICD-10-CM

## 2017-04-13 ENCOUNTER — Ambulatory Visit: Payer: Medicare Other | Admitting: Cardiology

## 2017-06-01 DIAGNOSIS — I10 Essential (primary) hypertension: Secondary | ICD-10-CM | POA: Diagnosis not present

## 2017-06-01 DIAGNOSIS — R739 Hyperglycemia, unspecified: Secondary | ICD-10-CM | POA: Diagnosis not present

## 2017-06-01 DIAGNOSIS — E785 Hyperlipidemia, unspecified: Secondary | ICD-10-CM | POA: Diagnosis not present

## 2017-06-01 DIAGNOSIS — J449 Chronic obstructive pulmonary disease, unspecified: Secondary | ICD-10-CM | POA: Diagnosis not present

## 2017-06-01 DIAGNOSIS — D539 Nutritional anemia, unspecified: Secondary | ICD-10-CM | POA: Diagnosis not present

## 2017-06-01 DIAGNOSIS — M1990 Unspecified osteoarthritis, unspecified site: Secondary | ICD-10-CM | POA: Diagnosis not present

## 2017-07-20 ENCOUNTER — Encounter: Payer: Self-pay | Admitting: Cardiology

## 2017-08-03 ENCOUNTER — Ambulatory Visit (INDEPENDENT_AMBULATORY_CARE_PROVIDER_SITE_OTHER): Payer: Medicare Other | Admitting: Cardiology

## 2017-08-03 ENCOUNTER — Encounter: Payer: Self-pay | Admitting: Cardiology

## 2017-08-03 VITALS — BP 110/80 | HR 73 | Ht 60.0 in | Wt 176.8 lb

## 2017-08-03 DIAGNOSIS — I6529 Occlusion and stenosis of unspecified carotid artery: Secondary | ICD-10-CM | POA: Diagnosis not present

## 2017-08-03 DIAGNOSIS — I10 Essential (primary) hypertension: Secondary | ICD-10-CM | POA: Diagnosis not present

## 2017-08-03 DIAGNOSIS — I5032 Chronic diastolic (congestive) heart failure: Secondary | ICD-10-CM

## 2017-08-03 MED ORDER — FUROSEMIDE 20 MG PO TABS: 20.0000 mg | ORAL_TABLET | Freq: Every day | ORAL | 0 refills | Status: DC

## 2017-08-03 MED ORDER — METOPROLOL SUCCINATE ER 25 MG PO TB24: 12.5000 mg | ORAL_TABLET | Freq: Every day | ORAL | 0 refills | Status: DC

## 2017-08-03 NOTE — Patient Instructions (Signed)
Medication Instructions:  Start Toprol, 12.5 MG, by mouth, once a day Start Lasix 20 MG by mouth, for only 1 week  Labwork: None  Testing/Procedures: None  Follow-Up: 1 Month follow up with PA  If you need a refill on your cardiac medications before your next appointment, please call your pharmacy.

## 2017-08-03 NOTE — Progress Notes (Signed)
Patient ID: [name], female   DOB: Oct 24, 1940, 77 y.o.   MRN: [id number]    Patient Name: [name] Date of Encounter: 08/03/2017  Primary Care Provider:  [name], MD Primary Cardiologist:  Ena Dawley  Problem List   Past Medical History:  Diagnosis Date  . Acute respiratory failure with hypoxia s/p tracheostomy 07/2013  . Anemia    a. 7-08/2013 felt due to recent critical illness/chronic disease.  . Arthritis    handds, knees & back   . Barrett's esophagus   . CAD (coronary artery disease)    a. Mod dz 2010 initially mgd medically. b. 12/2012 - angina s/p PTCA/DES to mid-circumflex, PTCA/DES to first OM.   Marland Kitchen Carotid artery disease ([place])    a. 60-70% bilat ICA stenosis by dopplers 05/2012.  Marland Kitchen Chronic back pain    deteriorating;DDD  . Colonic obstruction due to suspected colitis; s/p colectomy/colostomy    a. See SBO.  Marland Kitchen Complication of anesthesia    hard to wake up from anesthesia   . COPD (chronic obstructive pulmonary disease) ([place])   . Depression    takes Remeron and Zoloft daily  . Dizziness    was on Bentyl which caused this-took off of it and no problems since  . DVT of axillary vein, acute right ([place])    a. Dx 07/2013 felt due to R IJ central line that had been inserted 7/14. Not on anticoag due to GIB issues and small cerebral bleed.  Marland Kitchen GERD (gastroesophageal reflux disease)   . Heart murmur   . History of bronchitis    > 74yrs ago   . History of colon polyps   . History of hiatal hernia   . History of kidney stones    was told it was stable but doesn't know for sure that she ever passed it  . HTN (hypertension)   . Hypertension    takes Metoprolol daily  . Intracranial hemorrhage ([place])    a. 08/11/13 - per DC summary, tiny SAH vs SDH done for altered mentation, anticoag stopped including aspirin.  . Joint pain   . Neck pain    DDD  . Other and unspecified hyperlipidemia    takes Lipitor daily  . Pneumonia    hx of->15 yrs  ago  . Protein-calorie malnutrition, severe w/ electrolyte imbalance    a. Severe hypoalbuminemia leading to 3rd spacing including anasarca and pulm edema 07/2013.  Marland Kitchen SBO (small bowel obstruction) ([place])    a. Lysis of adhesions and ovarian cystectomy 04/2013 for sBO. b. Admitted 07/2013 for colonic obstruction due to suspected colitis, s/p partial colectomy/colostomy 08/01/13. Admission complicated by anasarca, acute resp failure requiring tracheostomy, decannulated 08/25/13. c. Recurrent SBO8/2015, NGT placed for decompression.   . Stroke [facility]) early 80's   right sided weakness--TIA   Past Surgical History:  Procedure Laterality Date  . ABDOMINAL HYSTERECTOMY    . APPENDECTOMY    . BOWEL RESECTION N/A 08/01/2013   Procedure: SMALL BOWEL RESECTION;  Surgeon: Harl Bowie, MD;  Location: [place];  Service: General;  Laterality: N/A;  . cataract surgery Bilateral   . CHOLECYSTECTOMY    . COLON RESECTION    . COLON SURGERY    . COLOSTOMY N/A 08/01/2013   Procedure: COLOSTOMY;  Surgeon: Harl Bowie, MD;  Location: [place];  Service: General;  Laterality: N/A;  . COLOSTOMY TAKEDOWN  01/24/2014   dr blackman  . COLOSTOMY TAKEDOWN N/A 01/24/2014   Procedure: COLOSTOMY TAKEDOWN;  Surgeon: Coralie Keens, MD;  Location: [place];  Service: General;  Laterality: N/A;  . CORONARY ANGIOPLASTY WITH STENT PLACEMENT  12/20/2012   STENT TO OM         DR COOPER  . ENDARTERECTOMY Right 08/08/2014   Procedure: RIGHT CAROTID ENDARTERECTOMY WITH HEMASHIELD PATCH ANGIOPLASTY;  Surgeon: Elam Dutch, MD;  Location: [place];  Service: Vascular;  Laterality: Right;  . FOOT SURGERY Left   . HAND SURGERY Bilateral    for ganglion cysts  . LAPAROTOMY N/A 08/01/2013   Procedure: EXPLORATORY LAPAROTOMY;  Surgeon: Harl Bowie, MD;  Location: [place];  Service: General;  Laterality: N/A;  . LEFT HEART CATHETERIZATION WITH CORONARY ANGIOGRAM N/A 12/20/2012   Procedure: LEFT HEART CATHETERIZATION WITH CORONARY  ANGIOGRAM;  Surgeon: Blane Ohara, MD;  Location[facility] C[facility];  Service: Cardiovascular;  Laterality: N/A;  . PARTIAL COLECTOMY N/A 08/01/2013   Procedure: PARTIAL COLECTOMY;  Surgeon: Harl Bowie, MD;  Location: [place];  Service: General;  Laterality: N/A;  . ROTATOR CUFF REPAIR Left   . TONSILLECTOMY    . TRACHEOSTOMY     feinstein    Allergies  Allergies  Allergen Reactions  . Pregabalin Swelling    Tongue swelling  . Sulfonamide Derivatives Swelling    Childhood reaction    HPI  [name] was previously followed by Dr Verl Blalock. She is a pleasant 77 year old female with h/o hyperlipidemia, hypertension, PAD and CAD who returns today for a 1 year follow up. The patient is a lifelong smoker.  She had worsening exertional CP and DOE in November 2014, underwent a cardiac cath on 12/20/2012 with finding of severe stenosis in OM1 and received 2 DES. She was doing well from cardiac standpoint. In May 2015 had a SBO and required partial colon resection and now has a colostomy bag, lost 30 lbs.   09/01/14 - the patient had a rough month, she underwent right carotid endarterectomy on 40/98/1191 that was complicated by postop stroke in the recovery room. This resulted in a left hemiplegia. She still has some mild left facial droop and weakness in her left arm, she is able to walk. She was also admitted on 08/29/14 with a small bowel obstruction that resolved and no surgery was needed. She denies CP, SOB, orthopnea or PND, no LE edema.   06/13/2015  - patient is coming after 3 months, she states that she cut down her smoking to minimally fifth cigarettes a day, she denies any chest shortness of breath no palpitations or syncope. She has no lower extremity edema or orthopnea. She is compliant with her meds and denies any side effects especially with atorvastatin. She saw Dr. Fletcher Anon concluded that she only has mild peripheral arterial disease continue just medical therapy. She is minimally  active.   01/30/2016 - this 6 months follow-up, she was doing well from cardiac standpoint but was hospitalized in [facility] on 01/15/2016 acute respiratory failure and diagnosed with pneumonia and treated with IV antibiotics. She states that she feels significantly better. She has no chest pain other than when she is coughing. Her shortness of breath has improved and she has no lower extremity edema orthopnea or proximal nocturnal dyspnea. She has been compliant with her medicines and has no side effects. She doesn't have much appetite.  08/03/2017 - 6 months follow up, no chest pian or SOB, mild LE edema, no orthopnea or PND, no falls. Mild DOE.   Home Medications  Prior to  Admission medications   Medication Sig Start Date End Date Taking? Authorizing Provider  aspirin 81 MG tablet Take 81 mg by mouth 2 (two) times daily.    Yes Historical Provider, MD  atorvastatin (LIPITOR) 80 MG tablet Take 80 mg by mouth daily.   Yes Historical Provider, MD  dicyclomine (BENTYL) 10 MG capsule as directed. 08/20/10  Yes Historical Provider, MD  diphenhydramine-acetaminophen (TYLENOL PM) 25-500 MG TABS Take 1 tablet by mouth at bedtime as needed.     Yes Historical Provider, MD  fish oil-omega-3 fatty acids 1000 MG capsule Take 1 g by mouth daily.     Yes Historical Provider, MD  folic acid (FOLVITE) 540 MCG tablet Take 400 mcg by mouth daily.     Yes Historical Provider, MD  metoprolol succinate (TOPROL-XL) 25 MG 24 hr tablet TAKE 1 TABLET ONCE DAILY. 05/04/12  Yes Renella Cunas, MD  nitroGLYCERIN (NITROSTAT) 0.4 MG SL tablet Place 0.4 mg under the tongue every 5 (five) minutes as needed.     Yes Historical Provider, MD  omeprazole (PRILOSEC) 20 MG capsule Take 20 mg by mouth 2 (two) times daily.     Yes Historical Provider, MD  sertraline (ZOLOFT) 100 MG tablet Take 150 mg by mouth daily.    Yes Historical Provider, MD    Family History  Family History  Problem Relation Age of Onset  . Varicose  Veins Mother   . Heart disease Father   . Heart attack Father   . AAA (abdominal aortic aneurysm) Father   . Heart disease Unknown     Social History  Social History   Socioeconomic History  . Marital status: Married    Spouse name: Not on file  . Number of children: Not on file  . Years of education: Not on file  . Highest education level: Not on file  Occupational History  . Occupation: full time  Social Needs  . Financial resource strain: Not on file  . Food insecurity:    Worry: Not on file    Inability: Not on file  . Transportation needs:    Medical: Not on file    Non-medical: Not on file  Tobacco Use  . Smoking status: Current Some Day Smoker    Packs/day: 0.25    Years: 53.00    Pack years: 13.25    Types: Cigarettes  . Smokeless tobacco: Never Used  . Tobacco comment: also uses e cig, 4 A DAY  Substance and Sexual Activity  . Alcohol use: No    Alcohol/week: 0.0 oz  . Drug use: No  . Sexual activity: Not Currently    Birth control/protection: Surgical  Lifestyle  . Physical activity:    Days per week: Not on file    Minutes per session: Not on file  . Stress: Not on file  Relationships  . Social connections:    Talks on phone: Not on file    Gets together: Not on file    Attends religious service: Not on file    Active member of club or organization: Not on file    Attends meetings of clubs or organizations: Not on file    Relationship status: Not on file  . Intimate partner violence:    Fear of current or ex partner: Not on file    Emotionally abused: Not on file    Physically abused: Not on file    Forced sexual activity: Not on file  Other Topics Concern  . Not on  file  Social History Narrative  . Not on file     Review of Systems, as per HPI, otherwise negative General:  No chills, fever, night sweats or weight changes.  Cardiovascular:  No chest pain, dyspnea on exertion, edema, orthopnea, palpitations, paroxysmal nocturnal  dyspnea. Dermatological: No rash, lesions/masses Respiratory: No cough, dyspnea Urologic: No hematuria, dysuria Abdominal:   No nausea, vomiting, diarrhea, bright red blood per rectum, melena, or hematemesis Neurologic:  No visual changes, wkns, changes in mental status. All other systems reviewed and are otherwise negative except as noted above.  Physical Exam  Blood pressure 110/80, pulse 73, height 5' (1.524 m), weight 176 lb 12.8 oz (80.2 kg), SpO2 99 %.  General: Pleasant, NAD Psych: Normal affect. Neuro: Alert and oriented X 3. Moves all extremities spontaneously. HEENT: Normal  Neck: Supple without bruits or JVD. Well healed scar post right carotid endarterectomy. Lungs:  Resp regular and unlabored, CTA. Heart: RRR no s3, s4, or murmurs. Abdomen: Soft, non-tender, non-distended, BS + x 4.  Extremities: No clubbing, cyanosis or edema. DP/PT weak B/L, Radials 2+ and equal bilaterally.  Labs:  No results for input(s): CKTOTAL, CKMB, TROPONINI in the last 72 hours. Lab Results  Component Value Date   WBC 9.9 01/30/2016   HGB 11.0 (L) 01/30/2016   HCT 35.0 01/30/2016   MCV 83 01/30/2016   PLT 320 01/30/2016    Lab Results  Component Value Date   CHOL 150 01/30/2016   HDL 38 (L) 01/30/2016   LDLCALC 84 01/30/2016   TRIG 138 01/30/2016   Lab Results  Component Value Date   DDIMER 2.78 (H) 10/20/2013   Invalid input(s): POCBNP  Accessory Clinical Findings  Echocardiogram - 08/03/2013 Study Conclusions  - Left ventricle: The cavity size was normal. Systolic function was normal. Wall motion was normal; there were no regional wall motion abnormalities. - Left atrium: The atrium was severely dilated.  ECG - SR, normal ECG, unchanged from prior, personally reviewed  Cardiac cath 12/20/2012   Lexiscan nuclear stress test: 11/04/2013 Quantitative Gated Spect Images  QGS EDV: 70 ml  QGS ESV: 22 ml  Impression  Exercise Capacity: Lexiscan with no exercise.  BP  Response: Normal blood pressure response.  Clinical Symptoms: Chest tightness  ECG Impression: No significant ST segment change suggestive of ischemia.  Comparison with Prior Nuclear Study: Study is compared with the report of the study from October, 2009  Overall Impression: Normal stress nuclear study. No scar or ischemia. Low risk scan. No change when compared to the report of October, 2009  LV Ejection Fraction: 69%. LV Wall Motion: Normal Wall Motion.  03/2014  Carotid US Heterogeneous plaque, bilaterally. Essentially stable 60-79% bilateral ICA stenosis.(LICA high end of range) Elevated left subclavian artery velocities. Patent vertebral arteries with antegrade flow  08/09/2014 Left ventricle: The cavity size was normal. There was mild concentric hypertrophy. Systolic function was vigorous. The estimated ejection fraction was in the range of 65% to 70%. Wall motion was normal; there were no regional wall motion abnormalities. Doppler parameters are consistent with elevated ventricular end-diastolic filling pressure. - Aortic valve: Trileaflet; normal thickness leaflets. There was no regurgitation. - Aortic root: The aortic root was normal in size. - Left atrium: The atrium was mildly dilated. - Right ventricle: Systolic function was normal. - Right atrium: The atrium was normal in size. - Tricuspid valve: There was trivial regurgitation. - Pulmonic valve: There was no regurgitation. - Pulmonary arteries: Systolic pressure was within the normal range. -  Inferior vena cava: The vessel was normal in size. - Pericardium, extracardiac: There was no pericardial effusion.   Assessment & Plan   78 year old female with multiple risk factors for CAD including lifelong smoking, known B/L carotid disease, HTN and hyperlipidemia  1. CAD- stable - Severe left circumflex and OM disease treated successfully with PCI x 2 on 12/20/2012. DAPT d/c-ed in 12/15. Negative stress test  on 11/07/2013.  Continue ASA and atorvastatin, decrease dose of metoprolol to 12.5 mg po BID.  2. Chronic diastolic CHF - start lasix 20 mg po daily x 1 week.  3. Hypertension - controlled, continue current management.   4. Hyperlipidemia - on high dose of atorvastatin 80 mg PO QHS, normal LFTs. Tolerated well.  5. Carotid disease - s/p right carotid endarterectomy - complicated by stroke. Followed by Dr Oneida Alar. No bruit.  Follow up in 1 month.  Ena Dawley, MD, [facility] 08/03/2017, 4:07 PM

## 2017-08-25 DIAGNOSIS — M25552 Pain in left hip: Secondary | ICD-10-CM | POA: Diagnosis not present

## 2017-08-25 DIAGNOSIS — S0990XA Unspecified injury of head, initial encounter: Secondary | ICD-10-CM | POA: Diagnosis not present

## 2017-08-25 DIAGNOSIS — S7002XA Contusion of left hip, initial encounter: Secondary | ICD-10-CM | POA: Diagnosis not present

## 2017-08-25 DIAGNOSIS — S0003XA Contusion of scalp, initial encounter: Secondary | ICD-10-CM | POA: Diagnosis not present

## 2017-08-25 DIAGNOSIS — S79912A Unspecified injury of left hip, initial encounter: Secondary | ICD-10-CM | POA: Diagnosis not present

## 2017-09-15 ENCOUNTER — Ambulatory Visit: Payer: Medicare Other | Admitting: Physician Assistant

## 2017-09-25 DIAGNOSIS — B9689 Other specified bacterial agents as the cause of diseases classified elsewhere: Secondary | ICD-10-CM | POA: Diagnosis not present

## 2017-09-25 DIAGNOSIS — N390 Urinary tract infection, site not specified: Secondary | ICD-10-CM | POA: Diagnosis not present

## 2017-09-25 DIAGNOSIS — K449 Diaphragmatic hernia without obstruction or gangrene: Secondary | ICD-10-CM | POA: Diagnosis not present

## 2017-10-06 DIAGNOSIS — E785 Hyperlipidemia, unspecified: Secondary | ICD-10-CM | POA: Diagnosis not present

## 2017-10-06 DIAGNOSIS — I10 Essential (primary) hypertension: Secondary | ICD-10-CM | POA: Diagnosis not present

## 2017-10-06 DIAGNOSIS — D539 Nutritional anemia, unspecified: Secondary | ICD-10-CM | POA: Diagnosis not present

## 2017-10-06 DIAGNOSIS — R739 Hyperglycemia, unspecified: Secondary | ICD-10-CM | POA: Diagnosis not present

## 2017-10-07 ENCOUNTER — Ambulatory Visit: Payer: Medicare Other | Admitting: Physician Assistant

## 2017-10-08 ENCOUNTER — Ambulatory Visit (HOSPITAL_COMMUNITY): Payer: Medicare Other

## 2017-10-08 ENCOUNTER — Ambulatory Visit: Payer: Medicare Other | Admitting: Family

## 2017-10-13 DIAGNOSIS — I10 Essential (primary) hypertension: Secondary | ICD-10-CM | POA: Diagnosis not present

## 2017-10-13 DIAGNOSIS — R739 Hyperglycemia, unspecified: Secondary | ICD-10-CM | POA: Diagnosis not present

## 2017-10-13 DIAGNOSIS — M1990 Unspecified osteoarthritis, unspecified site: Secondary | ICD-10-CM | POA: Diagnosis not present

## 2017-10-13 DIAGNOSIS — E785 Hyperlipidemia, unspecified: Secondary | ICD-10-CM | POA: Diagnosis not present

## 2017-10-13 DIAGNOSIS — J309 Allergic rhinitis, unspecified: Secondary | ICD-10-CM | POA: Diagnosis not present

## 2017-10-27 ENCOUNTER — Encounter: Payer: Self-pay | Admitting: *Deleted

## 2017-10-29 DIAGNOSIS — S9032XA Contusion of left foot, initial encounter: Secondary | ICD-10-CM | POA: Diagnosis not present

## 2017-11-05 ENCOUNTER — Ambulatory Visit (HOSPITAL_COMMUNITY)
Admission: RE | Admit: 2017-11-05 | Discharge: 2017-11-05 | Disposition: A | Discharge status: Home / Self Care | Payer: Medicare Other | Source: Ambulatory Visit | Attending: Family | Admitting: Family

## 2017-11-05 ENCOUNTER — Ambulatory Visit (INDEPENDENT_AMBULATORY_CARE_PROVIDER_SITE_OTHER): Payer: Medicare Other | Admitting: Family

## 2017-11-05 ENCOUNTER — Encounter: Payer: Self-pay | Admitting: Family

## 2017-11-05 VITALS — BP 169/87 | HR 67 | Temp 97.5°F | Resp 18 | Ht 60.0 in | Wt 176.0 lb

## 2017-11-05 DIAGNOSIS — I6523 Occlusion and stenosis of bilateral carotid arteries: Secondary | ICD-10-CM

## 2017-11-05 DIAGNOSIS — I6529 Occlusion and stenosis of unspecified carotid artery: Secondary | ICD-10-CM | POA: Insufficient documentation

## 2017-11-05 DIAGNOSIS — Z9889 Other specified postprocedural states: Secondary | ICD-10-CM | POA: Diagnosis not present

## 2017-11-05 DIAGNOSIS — F17200 Nicotine dependence, unspecified, uncomplicated: Secondary | ICD-10-CM

## 2017-11-05 NOTE — Progress Notes (Signed)
Chief Complaint: Follow up Extracranial Carotid Artery Stenosis   History of Present Illness  [name] is a 77 y.o. female whois s/p right carotid endarterectomy on 08/08/2014 by Dr. Oneida Alar. Unfortunately, she suffered a perioperative stroke at that time resulting in left arm weakness and numbness. She continues to have residual left sided weakness and numbness stating she is 75% back to normal. She ambulates with a cane.   She denies any episodes of amaurosis fugax, new weakness or numbness of the extremities or slurred speech.  She has PAD followed by Dr. Meda Coffee. She is on medical management for this. She has CAD, CHF, hypertension and hyperlipidemia all of which are stable.  She denies steal type symptoms in either upper extremity.   Diabetic: no Pt smoker: smoker (3-4 cigs/day, started at age 26yrs, quit a couple of times)  Pt meds include: Statin : yes ASA: yes Other anticoagulants/antiplatelets: no   Past Medical History:  Diagnosis Date  . Acute respiratory failure with hypoxia s/p tracheostomy 07/2013  . Anemia    a. 7-08/2013 felt due to recent critical illness/chronic disease.  . Arthritis    handds, knees & back   . Barrett's esophagus   . CAD (coronary artery disease)    a. Mod dz 2010 initially mgd medically. b. 12/2012 - angina s/p PTCA/DES to mid-circumflex, PTCA/DES to first OM.   Marland Kitchen Carotid artery disease ([place])    a. 60-70% bilat ICA stenosis by dopplers 05/2012.  Marland Kitchen Chronic back pain    deteriorating;DDD  . Colonic obstruction due to suspected colitis; s/p colectomy/colostomy    a. See SBO.  Marland Kitchen Complication of anesthesia    hard to wake up from anesthesia   . COPD (chronic obstructive pulmonary disease) ([place])   . Depression    takes Remeron and Zoloft daily  . Dizziness    was on Bentyl which caused this-took off of it and no problems since  . DVT of axillary vein, acute right ([place])    a. Dx 07/2013 felt due to R IJ central line that had  been inserted 7/14. Not on anticoag due to GIB issues and small cerebral bleed.  Marland Kitchen GERD (gastroesophageal reflux disease)   . Heart murmur   . History of bronchitis    > 52yrs ago   . History of colon polyps   . History of hiatal hernia   . History of kidney stones    was told it was stable but doesn't know for sure that she ever passed it  . HTN (hypertension)   . Hypertension    takes Metoprolol daily  . Intracranial hemorrhage ([place])    a. 08/11/13 - per DC summary, tiny SAH vs SDH done for altered mentation, anticoag stopped including aspirin.  . Joint pain   . Neck pain    DDD  . Other and unspecified hyperlipidemia    takes Lipitor daily  . Pneumonia    hx of->15 yrs ago  . Protein-calorie malnutrition, severe w/ electrolyte imbalance    a. Severe hypoalbuminemia leading to 3rd spacing including anasarca and pulm edema 07/2013.  Marland Kitchen SBO (small bowel obstruction) ([place])    a. Lysis of adhesions and ovarian cystectomy 04/2013 for sBO. b. Admitted 07/2013 for colonic obstruction due to suspected colitis, s/p partial colectomy/colostomy 08/01/13. Admission complicated by anasarca, acute resp failure requiring tracheostomy, decannulated 08/25/13. c. Recurrent SBO8/2015, NGT placed for decompression.   . Stroke [facility]) early 49's   right sided weakness--TIA    Social  History Social History   Tobacco Use  . Smoking status: Current Some Day Smoker    Packs/day: 0.25    Years: 53.00    Pack years: 13.25    Types: Cigarettes  . Smokeless tobacco: Never Used  . Tobacco comment: also uses e cig, 4 A DAY  Substance Use Topics  . Alcohol use: No    Alcohol/week: 0.0 standard drinks  . Drug use: No    Family History Family History  Problem Relation Age of Onset  . Varicose Veins Mother   . Heart disease Father   . Heart attack Father   . AAA (abdominal aortic aneurysm) Father   . Heart disease Unknown     Surgical History Past Surgical History:  Procedure Laterality Date  .  ABDOMINAL HYSTERECTOMY    . APPENDECTOMY    . BOWEL RESECTION N/A 08/01/2013   Procedure: SMALL BOWEL RESECTION;  Surgeon: Harl Bowie, MD;  Location: [place];  Service: General;  Laterality: N/A;  . cataract surgery Bilateral   . CHOLECYSTECTOMY    . COLON RESECTION    . COLON SURGERY    . COLOSTOMY N/A 08/01/2013   Procedure: COLOSTOMY;  Surgeon: Harl Bowie, MD;  Location: [place];  Service: General;  Laterality: N/A;  . COLOSTOMY TAKEDOWN  01/24/2014   dr blackman  . COLOSTOMY TAKEDOWN N/A 01/24/2014   Procedure: COLOSTOMY TAKEDOWN;  Surgeon: Coralie Keens, MD;  Location: [place];  Service: General;  Laterality: N/A;  . CORONARY ANGIOPLASTY WITH STENT PLACEMENT  12/20/2012   STENT TO OM         DR COOPER  . ENDARTERECTOMY Right 08/08/2014   Procedure: RIGHT CAROTID ENDARTERECTOMY WITH HEMASHIELD PATCH ANGIOPLASTY;  Surgeon: Elam Dutch, MD;  Location: [place];  Service: Vascular;  Laterality: Right;  . FOOT SURGERY Left   . HAND SURGERY Bilateral    for ganglion cysts  . LAPAROTOMY N/A 08/01/2013   Procedure: EXPLORATORY LAPAROTOMY;  Surgeon: Harl Bowie, MD;  Location: [place];  Service: General;  Laterality: N/A;  . LEFT HEART CATHETERIZATION WITH CORONARY ANGIOGRAM N/A 12/20/2012   Procedure: LEFT HEART CATHETERIZATION WITH CORONARY ANGIOGRAM;  Surgeon: Blane Ohara, MD;  Location[facility] C[facility] LAB;  Service: Cardiovascular;  Laterality: N/A;  . PARTIAL COLECTOMY N/A 08/01/2013   Procedure: PARTIAL COLECTOMY;  Surgeon: Harl Bowie, MD;  Location: [place];  Service: General;  Laterality: N/A;  . ROTATOR CUFF REPAIR Left   . TONSILLECTOMY    . TRACHEOSTOMY     [facility]    Allergies  Allergen Reactions  . Pregabalin Swelling    Tongue swelling  . Sulfonamide Derivatives Swelling    Childhood reaction    Current Outpatient Medications  Medication Sig Dispense Refill  . aspirin EC 81 MG tablet Take 162 mg by mouth daily.    Marland Kitchen atorvastatin (LIPITOR) 80 MG  tablet Take 80 mg by mouth every evening.     Marland Kitchen buPROPion (WELLBUTRIN XL) 150 MG 24 hr tablet Take 1 tablet (150 mg total) by mouth daily. 90 tablet 3  . docusate sodium (COLACE) 100 MG capsule Take 100 mg by mouth 2 (two) times daily.    . fish oil-omega-3 fatty acids 1000 MG capsule Take 1,000 mg by mouth daily.     . folic acid (FOLVITE) 518 MCG tablet Take 800 mcg by mouth daily.     . furosemide (LASIX) 20 MG tablet Take 1 tablet (20 mg total) by mouth daily. Take for  1 week only. 7 tablet 0  . metoprolol succinate (TOPROL XL) 25 MG 24 hr tablet Take 0.5 tablets (12.5 mg total) by mouth daily. 90 tablet 0  . mirtazapine (REMERON) 30 MG tablet Take 1 tablet by mouth at bedtime.     . nitroGLYCERIN (NITROSTAT) 0.4 MG SL tablet DISSOLVE 1 TABLET UNDER THE TONGUE EVERY 5 MINUTES AS  NEEDED FOR CHEST PAIN UP TO 3 DOSES, THEN SEEK MEDICAL  ATTENTION 25 tablet 4  . ondansetron (ZOFRAN) 4 MG tablet Take 4 mg by mouth every 8 (eight) hours as needed for nausea or vomiting.     Marland Kitchen oxyCODONE (OXY IR/ROXICODONE) 5 MG immediate release tablet     . pantoprazole (PROTONIX) 40 MG tablet     . sertraline (ZOLOFT) 100 MG tablet Take 100 mg by mouth 2 (two) times daily.     No current facility-administered medications for this visit.     Review of Systems : See HPI for pertinent positives and negatives.  Physical Examination  Vitals:   11/05/17 1456 11/05/17 1500  BP: 133/80 (!) 169/87  Pulse: 69 67  Resp: 18   Temp: (!) 97.5 F (36.4 C)   TempSrc: Oral   SpO2: 98%   Weight: 176 lb (79.8 kg)   Height: 5' (1.524 m)    Body mass index is 34.37 kg/m.  General: WDWN obese femalein NAD GAIT:using cane, mild left upper extremity weakness HENT: No gross abnormalities  Eyes: PERRLA Pulmonary: Respirations are non-labored,adequateair movement in all fields, no rales, rhonchi, or wheezes. Cardiac: regularrhythm, nodetected murmur.  VASCULAR EXAM Carotid Bruits Right Left   Negative   Negative   Abdominal aortic pulseis notpalpable. Radial pulses are 2+palpable right and 1+ left.   LE Pulses Right Left  POPLITEAL notpalpable  notpalpable  POSTERIOR TIBIAL notpalpable  notpalpable   DORSALIS PEDIS ANTERIOR TIBIAL Not palpable  notpalpable     Gastrointestinal: soft, nontender, BS WNL, no r/g, nopalpable masses. Musculoskeletal: Nomuscle atrophy/wasting. M/S 5/5 In her right upper and lower extremites,4/5 in her left upperextremity,and 5/5 in leftlower extremity,extremities without ischemic changes. Skin: No rashes, no ulcers, no cellulitis.   Neurologic:  A&O X 3; appropriate affect, sensation is normal; speech is normal, CN 2-12 intact, pain and light touch intact in extremities, motor exam as listed above. Psychiatric: Normal thought content, mood appropriate to clinical situation.       Assessment: [name] is a 77 y.o. female who is s/p right CEA on 08/08/2014. She had a right CVA (embolosim of right cerebral artery), pt states during the surgery, with left upper and lower extremity weakness. She has madeprogress; improved facial droop, and left leg strength.  She denies any subsequent stroke or TIA.   Her atherosclerotic risk factors include continued smoking (has decreased use), CAD, COPD, and obesity.   She dances with her husband socially on a regular basis.   DATA  Carotid Duplex (11-05-17): Right ICA: CEA site, 1-39% stenosis Left ICA: 60-79% stenosis Bilateral vertebral artery flow is antegrade.  Bilateral subclavian artery waveforms are normal.  No significant change compared to the exams on 03-01-15, 04-03-16,10-06-16, and 04-06-17   Plan:  The patient was counseled re smoking cessation and given several free resources re smoking cessation.   Follow-up in 6 monthswith Carotid Duplex scan.  I discussed in depth with the patient the nature of  atherosclerosis, and emphasized the importance of maximal medical management including strict control of blood pressure, blood glucose, and lipid levels, obtaining regular  exercise, and cessation of smoking.  The patient is aware that without maximal medical management the underlying atherosclerotic disease process will progress, limiting the benefit of any interventions. The patient was given information about stroke prevention and what symptoms should prompt the patient to seek immediate medical care. Thank you for allowing [place] to participate in this patient's care.  Clemon Chambers, RN, MSN, FNP-C [facility] Office: [phone]  Clinic Physician: Oneida Alar  11/05/17 3:02 PM

## 2017-11-05 NOTE — Patient Instructions (Signed)
Steps to Quit Smoking Smoking tobacco can be bad for your health. It can also affect almost every organ in your body. Smoking puts you and people around you at risk for many serious long-lasting (chronic) diseases. Quitting smoking is hard, but it is one of the best things that you can do for your health. It is never too late to quit. What are the benefits of quitting smoking? When you quit smoking, you lower your risk for getting serious diseases and conditions. They can include:  Lung cancer or lung disease.  Heart disease.  Stroke.  Heart attack.  Not being able to have children (infertility).  Weak bones (osteoporosis) and broken bones (fractures).  If you have coughing, wheezing, and shortness of breath, those symptoms may get better when you quit. You may also get sick less often. If you are pregnant, quitting smoking can help to lower your chances of having a baby of low birth weight. What can I do to help me quit smoking? Talk with your doctor about what can help you quit smoking. Some things you can do (strategies) include:  Quitting smoking totally, instead of slowly cutting back how much you smoke over a period of time.  Going to in-person counseling. You are more likely to quit if you go to many counseling sessions.  Using resources and support systems, such as: ? Online chats with a counselor. ? Phone quitlines. ? Printed self-help materials. ? Support groups or group counseling. ? Text messaging programs. ? Mobile phone apps or applications.  Taking medicines. Some of these medicines may have nicotine in them. If you are pregnant or breastfeeding, do not take any medicines to quit smoking unless your doctor says it is okay. Talk with your doctor about counseling or other things that can help you.  Talk with your doctor about using more than one strategy at the same time, such as taking medicines while you are also going to in-person counseling. This can help make  quitting easier. What things can I do to make it easier to quit? Quitting smoking might feel very hard at first, but there is a lot that you can do to make it easier. Take these steps:  Talk to your family and friends. Ask them to support and encourage you.  Call phone quitlines, reach out to support groups, or work with a counselor.  Ask people who smoke to not smoke around you.  Avoid places that make you want (trigger) to smoke, such as: ? Bars. ? Parties. ? Smoke-break areas at work.  Spend time with people who do not smoke.  Lower the stress in your life. Stress can make you want to smoke. Try these things to help your stress: ? Getting regular exercise. ? Deep-breathing exercises. ? Yoga. ? Meditating. ? Doing a body scan. To do this, close your eyes, focus on one area of your body at a time from head to toe, and notice which parts of your body are tense. Try to relax the muscles in those areas.  Download or buy apps on your mobile phone or tablet that can help you stick to your quit plan. There are many free apps, such as QuitGuide from the CDC (Centers for Disease Control and Prevention). You can find more support from smokefree.gov and other websites.  This information is not intended to replace advice given to you by your health care provider. Make sure you discuss any questions you have with your health care provider. Document Released: 11/02/2008 Document   Revised: 09/04/2015 Document Reviewed: 05/23/2014 Elsevier Interactive Patient Education  2018 Elsevier Inc.     Stroke Prevention Some health problems and behaviors may make it more likely for you to have a stroke. Below are ways to lessen your risk of having a stroke.  Be active for at least 30 minutes on most or all days.  Do not smoke. Try not to be around others who smoke.  Do not drink too much alcohol. ? Do not have more than 2 drinks a day if you are a man. ? Do not have more than 1 drink a day if you  are a woman and are not pregnant.  Eat healthy foods, such as fruits and vegetables. If you were put on a specific diet, follow the diet as told.  Keep your cholesterol levels under control through diet and medicines. Look for foods that are low in saturated fat, trans fat, cholesterol, and are high in fiber.  If you have diabetes, follow all diet plans and take your medicine as told.  Ask your doctor if you need treatment to lower your blood pressure. If you have high blood pressure (hypertension), follow all diet plans and take your medicine as told by your doctor.  If you are 18-39 years old, have your blood pressure checked every 3-5 years. If you are age 40 or older, have your blood pressure checked every year.  Keep a healthy weight. Eat foods that are low in calories, salt, saturated fat, trans fat, and cholesterol.  Do not take drugs.  Avoid birth control pills, if this applies. Talk to your doctor about the risks of taking birth control pills.  Talk to your doctor if you have sleep problems (sleep apnea).  Take all medicine as told by your doctor. ? You may be told to take aspirin or blood thinner medicine. Take this medicine as told by your doctor. ? Understand your medicine instructions.  Make sure any other conditions you have are being taken care of.  Get help right away if:  You suddenly lose feeling (you feel numb) or have weakness in your face, arm, or leg.  Your face or eyelid hangs down to one side.  You suddenly feel confused.  You have trouble talking (aphasia) or understanding what people are saying.  You suddenly have trouble seeing in one or both eyes.  You suddenly have trouble walking.  You are dizzy.  You lose your balance or your movements are clumsy (uncoordinated).  You suddenly have a very bad headache and you do not know the cause.  You have new chest pain.  Your heart feels like it is fluttering or skipping a beat (irregular  heartbeat). Do not wait to see if the symptoms above go away. Get help right away. Call your local emergency services (911 in [place] Do not drive yourself to the hospital. This information is not intended to replace advice given to you by your health care provider. Make sure you discuss any questions you have with your health care provider. Document Released: 07/08/2011 Document Revised: 06/14/2015 Document Reviewed: 07/09/2012 Elsevier Interactive Patient Education  2018 Elsevier Inc.  

## 2017-11-11 ENCOUNTER — Ambulatory Visit: Payer: Medicare Other | Admitting: Physician Assistant

## 2017-11-13 DIAGNOSIS — Z23 Encounter for immunization: Secondary | ICD-10-CM | POA: Diagnosis not present

## 2017-12-10 ENCOUNTER — Ambulatory Visit: Payer: Medicare Other | Admitting: Physician Assistant

## 2018-01-29 DIAGNOSIS — M79602 Pain in left arm: Secondary | ICD-10-CM | POA: Diagnosis not present

## 2018-01-29 DIAGNOSIS — Z1231 Encounter for screening mammogram for malignant neoplasm of breast: Secondary | ICD-10-CM | POA: Diagnosis not present

## 2018-03-09 DIAGNOSIS — D539 Nutritional anemia, unspecified: Secondary | ICD-10-CM | POA: Diagnosis not present

## 2018-03-09 DIAGNOSIS — R739 Hyperglycemia, unspecified: Secondary | ICD-10-CM | POA: Diagnosis not present

## 2018-03-09 DIAGNOSIS — Z1231 Encounter for screening mammogram for malignant neoplasm of breast: Secondary | ICD-10-CM | POA: Diagnosis not present

## 2018-03-09 DIAGNOSIS — Z0000 Encounter for general adult medical examination without abnormal findings: Secondary | ICD-10-CM | POA: Diagnosis not present

## 2018-03-09 DIAGNOSIS — Z9181 History of falling: Secondary | ICD-10-CM | POA: Diagnosis not present

## 2018-03-09 DIAGNOSIS — E785 Hyperlipidemia, unspecified: Secondary | ICD-10-CM | POA: Diagnosis not present

## 2018-03-09 DIAGNOSIS — I10 Essential (primary) hypertension: Secondary | ICD-10-CM | POA: Diagnosis not present

## 2018-03-09 DIAGNOSIS — N959 Unspecified menopausal and perimenopausal disorder: Secondary | ICD-10-CM | POA: Diagnosis not present

## 2018-03-31 DIAGNOSIS — Z1231 Encounter for screening mammogram for malignant neoplasm of breast: Secondary | ICD-10-CM | POA: Diagnosis not present

## 2018-03-31 DIAGNOSIS — M8589 Other specified disorders of bone density and structure, multiple sites: Secondary | ICD-10-CM | POA: Diagnosis not present

## 2018-03-31 DIAGNOSIS — N959 Unspecified menopausal and perimenopausal disorder: Secondary | ICD-10-CM | POA: Diagnosis not present

## 2018-04-21 ENCOUNTER — Other Ambulatory Visit: Payer: Self-pay | Admitting: Cardiology

## 2018-04-26 ENCOUNTER — Other Ambulatory Visit: Payer: Self-pay

## 2018-04-26 DIAGNOSIS — I6523 Occlusion and stenosis of bilateral carotid arteries: Secondary | ICD-10-CM

## 2018-05-06 ENCOUNTER — Encounter (HOSPITAL_COMMUNITY): Payer: Medicare Other

## 2018-05-06 ENCOUNTER — Ambulatory Visit: Payer: Medicare Other | Admitting: Family

## 2018-05-06 DIAGNOSIS — L853 Xerosis cutis: Secondary | ICD-10-CM | POA: Diagnosis not present

## 2018-05-06 DIAGNOSIS — L301 Dyshidrosis [pompholyx]: Secondary | ICD-10-CM | POA: Diagnosis not present

## 2018-05-06 DIAGNOSIS — L299 Pruritus, unspecified: Secondary | ICD-10-CM | POA: Diagnosis not present

## 2018-07-15 DIAGNOSIS — M1990 Unspecified osteoarthritis, unspecified site: Secondary | ICD-10-CM | POA: Diagnosis not present

## 2018-07-15 DIAGNOSIS — D539 Nutritional anemia, unspecified: Secondary | ICD-10-CM | POA: Diagnosis not present

## 2018-07-15 DIAGNOSIS — E785 Hyperlipidemia, unspecified: Secondary | ICD-10-CM | POA: Diagnosis not present

## 2018-07-15 DIAGNOSIS — I10 Essential (primary) hypertension: Secondary | ICD-10-CM | POA: Diagnosis not present

## 2018-07-15 DIAGNOSIS — R739 Hyperglycemia, unspecified: Secondary | ICD-10-CM | POA: Diagnosis not present

## 2018-07-15 DIAGNOSIS — Z139 Encounter for screening, unspecified: Secondary | ICD-10-CM | POA: Diagnosis not present

## 2018-08-03 DIAGNOSIS — R42 Dizziness and giddiness: Secondary | ICD-10-CM | POA: Diagnosis not present

## 2018-08-03 DIAGNOSIS — R51 Headache: Secondary | ICD-10-CM | POA: Diagnosis not present

## 2018-08-03 DIAGNOSIS — H9319 Tinnitus, unspecified ear: Secondary | ICD-10-CM | POA: Diagnosis not present

## 2018-08-03 DIAGNOSIS — H9190 Unspecified hearing loss, unspecified ear: Secondary | ICD-10-CM | POA: Diagnosis not present

## 2018-08-16 ENCOUNTER — Other Ambulatory Visit: Payer: Self-pay

## 2018-08-16 ENCOUNTER — Ambulatory Visit (HOSPITAL_COMMUNITY)
Admission: RE | Admit: 2018-08-16 | Discharge: 2018-08-16 | Disposition: A | Discharge status: Home / Self Care | Payer: Medicare Other | Source: Ambulatory Visit | Attending: Family | Admitting: Family

## 2018-08-16 ENCOUNTER — Ambulatory Visit (INDEPENDENT_AMBULATORY_CARE_PROVIDER_SITE_OTHER): Payer: Medicare Other | Admitting: Family

## 2018-08-16 ENCOUNTER — Encounter: Payer: Self-pay | Admitting: Family

## 2018-08-16 VITALS — BP 132/73 | HR 80 | Temp 98.0°F | Resp 16 | Ht 60.0 in | Wt 167.0 lb

## 2018-08-16 DIAGNOSIS — I6523 Occlusion and stenosis of bilateral carotid arteries: Secondary | ICD-10-CM

## 2018-08-16 DIAGNOSIS — F17200 Nicotine dependence, unspecified, uncomplicated: Secondary | ICD-10-CM | POA: Diagnosis not present

## 2018-08-16 DIAGNOSIS — Z9889 Other specified postprocedural states: Secondary | ICD-10-CM | POA: Diagnosis not present

## 2018-08-16 NOTE — Patient Instructions (Signed)
Steps to Quit Smoking Smoking tobacco is the leading cause of preventable death. It can affect almost every organ in the body. Smoking puts you and people around you at risk for many serious, long-lasting (chronic) diseases. Quitting smoking can be hard, but it is one of the best things that you can do for your health. It is never too late to quit. How do I get ready to quit? When you decide to quit smoking, make a plan to help you succeed. Before you quit:  Pick a date to quit. Set a date within the next 2 weeks to give you time to prepare.  Write down the reasons why you are quitting. Keep this list in places where you will see it often.  Tell your family, friends, and co-workers that you are quitting. Their support is important.  Talk with your doctor about the choices that may help you quit.  Find out if your health insurance will pay for these treatments.  Know the people, places, things, and activities that make you want to smoke (triggers). Avoid them. What first steps can I take to quit smoking?  Throw away all cigarettes at home, at work, and in your car.  Throw away the things that you use when you smoke, such as ashtrays and lighters.  Clean your car. Make sure to empty the ashtray.  Clean your home, including curtains and carpets. What can I do to help me quit smoking? Talk with your doctor about taking medicines and seeing a counselor at the same time. You are more likely to succeed when you do both.  If you are pregnant or breastfeeding, talk with your doctor about counseling or other ways to quit smoking. Do not take medicine to help you quit smoking unless your doctor tells you to do so. To quit smoking: Quit right away  Quit smoking totally, instead of slowly cutting back on how much you smoke over a period of time.  Go to counseling. You are more likely to quit if you go to counseling sessions regularly. Take medicine You may take medicines to help you quit. Some  medicines need a prescription, and some you can buy over-the-counter. Some medicines may contain a drug called nicotine to replace the nicotine in cigarettes. Medicines may:  Help you to stop having the desire to smoke (cravings).  Help to stop the problems that come when you stop smoking (withdrawal symptoms). Your doctor may ask you to use:  Nicotine patches, gum, or lozenges.  Nicotine inhalers or sprays.  Non-nicotine medicine that is taken by mouth. Find resources Find resources and other ways to help you quit smoking and remain smoke-free after you quit. These resources are most helpful when you use them often. They include:  Online chats with a Social worker.  Phone quitlines.  Printed Furniture conservator/restorer.  Support groups or group counseling.  Text messaging programs.  Mobile phone apps. Use apps on your mobile phone or tablet that can help you stick to your quit plan. There are many free apps for mobile phones and tablets as well as websites. Examples include Quit Guide from the [employer] and smokefree.gov  What things can I do to make it easier to quit?   Talk to your family and friends. Ask them to support and encourage you.  Call a phone quitline (1-800-QUIT-NOW), reach out to support groups, or work with a Social worker.  Ask people who smoke to not smoke around you.  Avoid places that make you want to smoke,  such as: ? Bars. ? Parties. ? Smoke-break areas at work.  Spend time with people who do not smoke.  Lower the stress in your life. Stress can make you want to smoke. Try these things to help your stress: ? Getting regular exercise. ? Doing deep-breathing exercises. ? Doing yoga. ? Meditating. ? Doing a body scan. To do this, close your eyes, focus on one area of your body at a time from head to toe. Notice which parts of your body are tense. Try to relax the muscles in those areas. How will I feel when I quit smoking? Day 1 to 3 weeks Within the first 24 hours,  you may start to have some problems that come from quitting tobacco. These problems are very bad 2-3 days after you quit, but they do not often last for more than 2-3 weeks. You may get these symptoms:  Mood swings.  Feeling restless, nervous, angry, or annoyed.  Trouble concentrating.  Dizziness.  Strong desire for high-sugar foods and nicotine.  Weight gain.  Trouble pooping (constipation).  Feeling like you may vomit (nausea).  Coughing or a sore throat.  Changes in how the medicines that you take for other issues work in your body.  Depression.  Trouble sleeping (insomnia). Week 3 and afterward After the first 2-3 weeks of quitting, you may start to notice more positive results, such as:  Better sense of smell and taste.  Less coughing and sore throat.  Slower heart rate.  Lower blood pressure.  Clearer skin.  Better breathing.  Fewer sick days. Quitting smoking can be hard. Do not give up if you fail the first time. Some people need to try a few times before they succeed. Do your best to stick to your quit plan, and talk with your doctor if you have any questions or concerns. Summary  Smoking tobacco is the leading cause of preventable death. Quitting smoking can be hard, but it is one of the best things that you can do for your health.  When you decide to quit smoking, make a plan to help you succeed.  Quit smoking right away, not slowly over a period of time.  When you start quitting, seek help from your doctor, family, or friends. This information is not intended to replace advice given to you by your health care provider. Make sure you discuss any questions you have with your health care provider. Document Released: 11/02/2008 Document Revised: 03/26/2018 Document Reviewed: 03/27/2018 Elsevier Patient Education  2020 [employer].     Stroke Prevention Some medical conditions and lifestyle choices can lead to a higher risk for a stroke. You can  help to prevent a stroke by making nutrition, lifestyle, and other changes. What nutrition changes can be made?   Eat healthy foods. ? Choose foods that are high in fiber. These include:  Fresh fruits.  Fresh vegetables.  Whole grains. ? Eat at least 5 or more servings of fruits and vegetables each day. Try to fill half of your plate at each meal with fruits and vegetables. ? Choose lean protein foods. These include:  Lowfat (lean) cuts of meat.  Chicken without skin.  Fish.  Tofu.  Beans.  Nuts. ? Eat low-fat dairy products. ? Avoid foods that:  Are high in salt (sodium).  Have saturated fat.  Have trans fat.  Have cholesterol.  Are processed.  Are premade.  Follow eating guidelines as told by your doctor. These may include: ? Reducing how many calories you  eat and drink each day. ? Limiting how much salt you eat or drink each day to 1,500 milligrams (mg). ? Using only healthy fats for cooking. These include:  Olive oil.  Canola oil.  Sunflower oil. ? Counting how many carbohydrates you eat and drink each day. What lifestyle changes can be made?  Try to stay at a healthy weight. Talk to your doctor about what a good weight is for you.  Get at least 30 minutes of moderate physical activity at least 5 days a week. This can include: ? Fast walking. ? Biking. ? Swimming.  Do not use any products that have nicotine or tobacco. This includes cigarettes and e-cigarettes. If you need help quitting, ask your doctor. Avoid being around tobacco smoke in general.  Limit how much alcohol you drink to no more than 1 drink a day for nonpregnant women and 2 drinks a day for men. One drink equals 12 oz of beer, 5 oz of wine, or 1 oz of hard liquor.  Do not use drugs.  Avoid taking birth control pills. Talk to your doctor about the risks of taking birth control pills if: ? You are over 58 years old. ? You smoke. ? You get migraines. ? You have had a blood clot.  What other changes can be made?  Manage your cholesterol. ? It is important to eat a healthy diet. ? If your cholesterol cannot be managed through your diet, you may also need to take medicines. Take medicines as told by your doctor.  Manage your diabetes. ? It is important to eat a healthy diet and to exercise regularly. ? If your blood sugar cannot be managed through diet and exercise, you may need to take medicines. Take medicines as told by your doctor.  Control your high blood pressure (hypertension). ? Try to keep your blood pressure below 130/80. This can help lower your risk of stroke. ? It is important to eat a healthy diet and to exercise regularly. ? If your blood pressure cannot be managed through diet and exercise, you may need to take medicines. Take medicines as told by your doctor. ? Ask your doctor if you should check your blood pressure at home. ? Have your blood pressure checked every year. Do this even if your blood pressure is normal.  Talk to your doctor about getting checked for a sleep disorder. Signs of this can include: ? Snoring a lot. ? Feeling very tired.  Take over-the-counter and prescription medicines only as told by your doctor. These may include aspirin or blood thinners (antiplatelets or anticoagulants).  Make sure that any other medical conditions you have are managed. Where to find more information  American Stroke Association: www.strokeassociation.org  National Stroke Association: www.stroke.org Get help right away if:  You have any symptoms of stroke. "BE FAST" is an easy way to remember the main warning signs: ? B - Balance. Signs are dizziness, sudden trouble walking, or loss of balance. ? E - Eyes. Signs are trouble seeing or a sudden change in how you see. ? F - Face. Signs are sudden weakness or loss of feeling of the face, or the face or eyelid drooping on one side. ? A - Arms. Signs are weakness or loss of feeling in an arm. This  happens suddenly and usually on one side of the body. ? S - Speech. Signs are sudden trouble speaking, slurred speech, or trouble understanding what people say. ? T - Time. Time to call emergency  services. Write down what time symptoms started.  You have other signs of stroke, such as: ? A sudden, very bad headache with no known cause. ? Feeling sick to your stomach (nausea). ? Throwing up (vomiting). ? Jerky movements you cannot control (seizure). These symptoms may represent a serious problem that is an emergency. Do not wait to see if the symptoms will go away. Get medical help right away. Call your local emergency services (911 in the [place] Do not drive yourself to the hospital. Summary  You can prevent a stroke by eating healthy, exercising, not smoking, drinking less alcohol, and treating other health problems, such as diabetes, high blood pressure, or high cholesterol.  Do not use any products that contain nicotine or tobacco, such as cigarettes and e-cigarettes.  Get help right away if you have any signs or symptoms of a stroke. This information is not intended to replace advice given to you by your health care provider. Make sure you discuss any questions you have with your health care provider. Document Released: 07/08/2011 Document Revised: 03/04/2018 Document Reviewed: 04/09/2016 Elsevier Patient Education  2020 [employer].

## 2018-08-16 NOTE — Progress Notes (Signed)
Chief Complaint: Follow up Extracranial Carotid Artery Stenosis   History of Present Illness  [name] is a 78 y.o. female whois s/p right carotid endarterectomy on 08/08/2014 by Dr. Oneida Alar. Unfortunately, she suffered a perioperative stroke at that time resulting in left arm weakness and numbness. She continues to have residual left sided weakness and numbness stating she is 75% back to normal. She ambulates with a cane.   She denies any episodes of amaurosis fugax, new weakness or numbness of the extremities or slurred speech.  She has PAD followed by Dr. Meda Coffee. She is on medical management for this. She has CAD, CHF, hypertension and hyperlipidemia all of which are stable.  She reports a "static" sound in both ears that started about March 2020. She has been referred to ENT by her PCP.   She denies steal type symptoms in either upper extremity.   Diabetic: no Tobacco use: smoker (3-4 cigs/day, started at age 35yrs, quit a couple of times)  Pt meds include: Statin : yes ASA: yes Other anticoagulants/antiplatelets: no   Past Medical History:  Diagnosis Date  . Acute respiratory failure with hypoxia s/p tracheostomy 07/2013  . Anemia    a. 7-08/2013 felt due to recent critical illness/chronic disease.  . Arthritis    handds, knees & back   . Barrett's esophagus   . CAD (coronary artery disease)    a. Mod dz 2010 initially mgd medically. b. 12/2012 - angina s/p PTCA/DES to mid-circumflex, PTCA/DES to first OM.   Marland Kitchen Carotid artery disease ([place])    a. 60-70% bilat ICA stenosis by dopplers 05/2012.  Marland Kitchen Chronic back pain    deteriorating;DDD  . Colonic obstruction due to suspected colitis; s/p colectomy/colostomy    a. See SBO.  Marland Kitchen Complication of anesthesia    hard to wake up from anesthesia   . COPD (chronic obstructive pulmonary disease) ([place])   . Depression    takes Remeron and Zoloft daily  . Dizziness    was on Bentyl which caused this-took off of it  and no problems since  . DVT of axillary vein, acute right ([place])    a. Dx 07/2013 felt due to R IJ central line that had been inserted 7/14. Not on anticoag due to GIB issues and small cerebral bleed.  Marland Kitchen GERD (gastroesophageal reflux disease)   . Heart murmur   . History of bronchitis    > 36yrs ago   . History of colon polyps   . History of hiatal hernia   . History of kidney stones    was told it was stable but doesn't know for sure that she ever passed it  . HTN (hypertension)   . Hypertension    takes Metoprolol daily  . Intracranial hemorrhage ([place])    a. 08/11/13 - per DC summary, tiny SAH vs SDH done for altered mentation, anticoag stopped including aspirin.  . Joint pain   . Neck pain    DDD  . Other and unspecified hyperlipidemia    takes Lipitor daily  . Pneumonia    hx of->15 yrs ago  . Protein-calorie malnutrition, severe w/ electrolyte imbalance    a. Severe hypoalbuminemia leading to 3rd spacing including anasarca and pulm edema 07/2013.  Marland Kitchen SBO (small bowel obstruction) ([place])    a. Lysis of adhesions and ovarian cystectomy 04/2013 for sBO. b. Admitted 07/2013 for colonic obstruction due to suspected colitis, s/p partial colectomy/colostomy 08/01/13. Admission complicated by anasarca, acute resp failure requiring tracheostomy, decannulated  08/25/13. c. Recurrent SBO8/2015, NGT placed for decompression.   . Stroke [facility]) early 55's   right sided weakness--TIA    Social History Social History   Tobacco Use  . Smoking status: Current Some Day Smoker    Packs/day: 0.25    Years: 53.00    Pack years: 13.25    Types: Cigarettes  . Smokeless tobacco: Never Used  . Tobacco comment: also uses e cig, 4 A DAY  Substance Use Topics  . Alcohol use: No    Alcohol/week: 0.0 standard drinks  . Drug use: No    Family History Family History  Problem Relation Age of Onset  . Varicose Veins Mother   . Heart disease Father   . Heart attack Father   . AAA (abdominal aortic  aneurysm) Father   . Heart disease Unknown     Surgical History Past Surgical History:  Procedure Laterality Date  . ABDOMINAL HYSTERECTOMY    . APPENDECTOMY    . BOWEL RESECTION N/A 08/01/2013   Procedure: SMALL BOWEL RESECTION;  Surgeon: Harl Bowie, MD;  Location: [facility];  Service: General;  Laterality: N/A;  . cataract surgery Bilateral   . CHOLECYSTECTOMY    . COLON RESECTION    . COLON SURGERY    . COLOSTOMY N/A 08/01/2013   Procedure: COLOSTOMY;  Surgeon: Harl Bowie, MD;  Location: [facility];  Service: General;  Laterality: N/A;  . COLOSTOMY TAKEDOWN  01/24/2014   dr blackman  . COLOSTOMY TAKEDOWN N/A 01/24/2014   Procedure: COLOSTOMY TAKEDOWN;  Surgeon: Coralie Keens, MD;  Location: [facility];  Service: General;  Laterality: N/A;  . CORONARY ANGIOPLASTY WITH STENT PLACEMENT  12/20/2012   STENT TO OM         DR COOPER  . ENDARTERECTOMY Right 08/08/2014   Procedure: RIGHT CAROTID ENDARTERECTOMY WITH HEMASHIELD PATCH ANGIOPLASTY;  Surgeon: Elam Dutch, MD;  Location: [facility];  Service: Vascular;  Laterality: Right;  . FOOT SURGERY Left   . HAND SURGERY Bilateral    for ganglion cysts  . LAPAROTOMY N/A 08/01/2013   Procedure: EXPLORATORY LAPAROTOMY;  Surgeon: Harl Bowie, MD;  Location: [facility];  Service: General;  Laterality: N/A;  . LEFT HEART CATHETERIZATION WITH CORONARY ANGIOGRAM N/A 12/20/2012   Procedure: LEFT HEART CATHETERIZATION WITH CORONARY ANGIOGRAM;  Surgeon: Blane Ohara, MD;  Location: [facility] CATH LAB;  Service: Cardiovascular;  Laterality: N/A;  . PARTIAL COLECTOMY N/A 08/01/2013   Procedure: PARTIAL COLECTOMY;  Surgeon: Harl Bowie, MD;  Location: [facility];  Service: General;  Laterality: N/A;  . ROTATOR CUFF REPAIR Left   . TONSILLECTOMY    . TRACHEOSTOMY     fe[facility]    Allergies  Allergen Reactions  . Pregabalin Swelling    Tongue swelling  . Sulfonamide Derivatives Swelling    Childhood reaction    Current Outpatient Medications   Medication Sig Dispense Refill  . aspirin EC 81 MG tablet Take 162 mg by mouth daily.    Marland Kitchen atorvastatin (LIPITOR) 80 MG tablet Take 80 mg by mouth every evening.     Marland Kitchen buPROPion (WELLBUTRIN XL) 150 MG 24 hr tablet Take 1 tablet (150 mg total) by mouth daily. 90 tablet 3  . Clobetasol Prop Emollient Base 0.05 % emollient cream     . docusate sodium (COLACE) 100 MG capsule Take 100 mg by mouth 2 (two) times daily.    . fish oil-omega-3 fatty acids 1000 MG capsule Take 1,000 mg by mouth daily.     Marland Kitchen  folic acid (FOLVITE) 384 MCG tablet Take 800 mcg by mouth daily.     . furosemide (LASIX) 20 MG tablet Take 1 tablet (20 mg total) by mouth daily. Take for 1 week only. 7 tablet 0  . metoprolol succinate (TOPROL-XL) 25 MG 24 hr tablet TAKE ONE-HALF TABLET BY  MOUTH DAILY 45 tablet 4  . mirtazapine (REMERON) 30 MG tablet Take 1 tablet by mouth at bedtime.     . mirtazapine (REMERON) 45 MG tablet     . nitroGLYCERIN (NITROSTAT) 0.4 MG SL tablet DISSOLVE 1 TABLET UNDER THE TONGUE EVERY 5 MINUTES AS  NEEDED FOR CHEST PAIN UP TO 3 DOSES, THEN SEEK MEDICAL  ATTENTION 25 tablet 4  . ondansetron (ZOFRAN) 4 MG tablet Take 4 mg by mouth every 8 (eight) hours as needed for nausea or vomiting.     Marland Kitchen oxyCODONE (OXY IR/ROXICODONE) 5 MG immediate release tablet     . pantoprazole (PROTONIX) 40 MG tablet     . sertraline (ZOLOFT) 100 MG tablet Take 100 mg by mouth 2 (two) times daily.    . predniSONE (DELTASONE) 20 MG tablet      No current facility-administered medications for this visit.     Review of Systems : See HPI for pertinent positives and negatives.  Physical Examination  Vitals:   08/16/18 1329  BP: 132/73  Pulse: 80  Resp: 16  Temp: 98 F (36.7 C)  TempSrc: Temporal  SpO2: 97%  Weight: 167 lb (75.8 kg)  Height: 5' (1.524 m)   Body mass index is 32.61 kg/m.  General: WDWN obese female in NAD GAIT: normal Eyes: PERRLA HENT: No gross abnormalities.  Pulmonary:  Respirations are  non-labored, fair air movement in all fields, no rales, rhonchi, or wheezes. Cardiac: regular rhythm, no detected murmur.  VASCULAR EXAM Carotid Bruits Right Left   Negative Negative     Abdominal aortic pulse is not palpable. Radial pulses are palpable and equal.                                                                                                                            LE Pulses Right Left       POPLITEAL  not palpable   not palpable       POSTERIOR TIBIAL  not palpable   not palpable        DORSALIS PEDIS      ANTERIOR TIBIAL not palpable  not palpable     Gastrointestinal: soft, nontender, BS WNL, no r/g, no palpable masses. Musculoskeletal: no muscle atrophy/wasting. M/S 5/5 in right upper and lower extremities, 3/5 in left upper and lower extremities , extremities without ischemic changes Skin: No rashes, no ulcers, no cellulitis.   Neurologic:  A&O X 3; appropriate affect, sensation is normal; speech is normal, CN 2-12 intact, pain and light touch intact in extremities, motor exam as listed above. Psychiatric: Normal thought content, mood appropriate to clinical situation.  Assessment: [name] is a 78 y.o. female who is s/p right CEA on 08/08/2014. She had a right CVA (embolosim of right cerebral artery), pt states during the surgery, with left upper and lower extremity weakness. She has madeprogress; improved facial droop, and left leg strength.  She denies any subsequent stroke or TIA.   Her atherosclerotic risk factors include continued smoking (has decreased use), CAD, COPD, and obesity. She takes a daily statin and ASA.   Her pedal pulses are not palpable, but she does not seem to have claudication sx's with walking, no signs of ischemia in her lower extremities.   She was dancing with her husband socially on a regular basis until the COVID 19 pandemic closed the activity.    DATA Carotid Duplex (08-16-18): Right Carotid:  Velocities in the right ICA are consistent with a 40-59% stenosis. Left Carotid: Velocities in the left ICA are consistent with a 40-59% stenosis. Vertebrals:  Bilateral vertebral arteries demonstrate antegrade flow. Subclavians: Normal flow hemodynamics were seen in bilateral subclavian arteries.  Increased stenosis in the right, decreased stenosis in the left ICA compared to the exam on 11-05-17.    Plan: Follow-up in 1 year with Carotid Duplex scan.   Over 3 minutes was spent counseling patient re smoking cessation, and patient was given several free resources re smoking cessation.    I discussed in depth with the patient the nature of atherosclerosis, and emphasized the importance of maximal medical management including strict control of blood pressure, blood glucose, and lipid levels, obtaining regular exercise, and cessation of smoking.  The patient is aware that without maximal medical management the underlying atherosclerotic disease process will progress, limiting the benefit of any interventions. The patient was given information about stroke prevention and what symptoms should prompt the patient to seek immediate medical care. Thank you for allowing [place] to participate in this patient's care.  Clemon Chambers, RN, MSN, FNP-C [facility] Office: [phone]  Clinic Physician: Oneida Alar on call  08/16/18 1:52 PM

## 2018-08-26 DIAGNOSIS — H9319 Tinnitus, unspecified ear: Secondary | ICD-10-CM | POA: Diagnosis not present

## 2018-08-26 DIAGNOSIS — H903 Sensorineural hearing loss, bilateral: Secondary | ICD-10-CM | POA: Diagnosis not present

## 2018-10-01 ENCOUNTER — Encounter: Payer: Self-pay | Admitting: Cardiology

## 2018-10-01 ENCOUNTER — Other Ambulatory Visit: Payer: Self-pay

## 2018-10-01 ENCOUNTER — Ambulatory Visit (INDEPENDENT_AMBULATORY_CARE_PROVIDER_SITE_OTHER): Payer: Medicare Other | Admitting: Cardiology

## 2018-10-01 VITALS — BP 126/76 | HR 78 | Ht 60.0 in | Wt 168.8 lb

## 2018-10-01 DIAGNOSIS — I63411 Cerebral infarction due to embolism of right middle cerebral artery: Secondary | ICD-10-CM

## 2018-10-01 DIAGNOSIS — I10 Essential (primary) hypertension: Secondary | ICD-10-CM

## 2018-10-01 DIAGNOSIS — R001 Bradycardia, unspecified: Secondary | ICD-10-CM

## 2018-10-01 DIAGNOSIS — I2510 Atherosclerotic heart disease of native coronary artery without angina pectoris: Secondary | ICD-10-CM

## 2018-10-01 DIAGNOSIS — I63511 Cerebral infarction due to unspecified occlusion or stenosis of right middle cerebral artery: Secondary | ICD-10-CM | POA: Diagnosis not present

## 2018-10-01 DIAGNOSIS — I6523 Occlusion and stenosis of bilateral carotid arteries: Secondary | ICD-10-CM

## 2018-10-01 MED ORDER — NITROGLYCERIN 0.4 MG SL SUBL: 0.4000 mg | SUBLINGUAL_TABLET | SUBLINGUAL | 4 refills | Status: DC | PRN

## 2018-10-01 NOTE — Patient Instructions (Addendum)
Your physician has recommended you make the following change in your medication:  STOP METOPROLOL   ZIO PATCH   Your physician wants you to follow-up in: [place] will receive a reminder letter in the mail two months in advance. If you don't receive a letter, please call our office to schedule the follow-up appointment.

## 2018-10-01 NOTE — Progress Notes (Signed)
Cardiology Office Note:    Date:  10/02/2018   ID:  [name], DOB Jul 19, 1940, MRN [id number]  PCP:  [name], MD  Cardiologist:  Ena Dawley, MD  Electrophysiologist:  None   Referring MD: [name], MD   Chief complain: Recurrent falls  History of Present Illness:    [name] is a 77 y.o. female with a hx of hyperlipidemia, hypertension, PAD and CAD who returns today for a 1 year follow up. The patient is a lifelong smoker. In November 2014 she underwent a cardiac cath on 12/20/2012 with finding of severe stenosis in OM1 and received 2 DES. In May 2015 had a SBO and required partial colon resection.  She underwent right carotid endarterectomy on 29/93/7169 that was complicated by postop stroke in the recovery room. This resulted in a left hemiplegia. She was hospitalized in [facility] on 01/15/2016 acute respiratory failure and diagnosed with pneumonia and treated with IV antibiotics.   10/01/2018 - she is coming after 1 year, states that she is doing well from cardiac standpoint, she denies any chest pain or shortness of breath, however she continues to have recurrent falls for no obvious reason, this started approximately in May, she never loses consciousness, she is not sure if she trips over something or she just falls, there are no prodromal symptoms, no abnormal muscle motion involuntary incontinence.  Not related to orthostatic hypotension, she developed tinnitus around the same times and fullness in her ears and was evaluated by an ear specialist who did not find anything abnormal.  She continues to fall about 3 times a week, she went to ER on one occasion after she hit her arm and head.  She denies any chest pain palpitation or shortness of breath at the time of falls.  Past Medical History:  Diagnosis Date   Acute respiratory failure with hypoxia s/p tracheostomy 07/2013   Anemia    a. 7-08/2013 felt due to recent critical  illness/chronic disease.   Arthritis    handds, knees & back    Barrett's esophagus    CAD (coronary artery disease)    a. Mod dz 2010 initially mgd medically. b. 12/2012 - angina s/p PTCA/DES to mid-circumflex, PTCA/DES to first OM.    Carotid artery disease ([place])    a. 60-70% bilat ICA stenosis by dopplers 05/2012.   Chronic back pain    deteriorating;DDD   Colonic obstruction due to suspected colitis; s/p colectomy/colostomy    a. See SBO.   Complication of anesthesia    hard to wake up from anesthesia    COPD (chronic obstructive pulmonary disease) (HCC)    Depression    takes Remeron and Zoloft daily   Dizziness    was on Bentyl which caused this-took off of it and no problems since   DVT of axillary vein, acute right ([place])    a. Dx 07/2013 felt due to R IJ central line that had been inserted 7/14. Not on anticoag due to GIB issues and small cerebral bleed.   GERD (gastroesophageal reflux disease)    Heart murmur    History of bronchitis    > 68yrs ago    History of colon polyps    History of hiatal hernia    History of kidney stones    was told it was stable but doesn't know for sure that she ever passed it   HTN (hypertension)    Hypertension    takes Metoprolol daily  Intracranial hemorrhage ([place])    a. 08/11/13 - per DC summary, tiny SAH vs SDH done for altered mentation, anticoag stopped including aspirin.   Joint pain    Neck pain    DDD   Other and unspecified hyperlipidemia    takes Lipitor daily   Pneumonia    hx of->15 yrs ago   Protein-calorie malnutrition, severe w/ electrolyte imbalance    a. Severe hypoalbuminemia leading to 3rd spacing including anasarca and pulm edema 07/2013.   SBO (small bowel obstruction) ([place])    a. Lysis of adhesions and ovarian cystectomy 04/2013 for sBO. b. Admitted 07/2013 for colonic obstruction due to suspected colitis, s/p partial colectomy/colostomy 08/01/13. Admission complicated by anasarca, acute resp  failure requiring tracheostomy, decannulated 08/25/13. c. Recurrent SBO8/2015, NGT placed for decompression.    Stroke [facility]) early 80's   right sided weakness--TIA    Past Surgical History:  Procedure Laterality Date   ABDOMINAL HYSTERECTOMY     APPENDECTOMY     BOWEL RESECTION N/A 08/01/2013   Procedure: SMALL BOWEL RESECTION;  Surgeon: Harl Bowie, MD;  Location: [place];  Service: General;  Laterality: N/A;   cataract surgery Bilateral    CHOLECYSTECTOMY     COLON RESECTION     COLON SURGERY     COLOSTOMY N/A 08/01/2013   Procedure: COLOSTOMY;  Surgeon: Harl Bowie, MD;  Location: [place];  Service: General;  Laterality: N/A;   COLOSTOMY TAKEDOWN  01/24/2014   dr blackman   COLOSTOMY TAKEDOWN N/A 01/24/2014   Procedure: COLOSTOMY TAKEDOWN;  Surgeon: Coralie Keens, MD;  Location: [place];  Service: General;  Laterality: N/A;   CORONARY ANGIOPLASTY WITH STENT PLACEMENT  12/20/2012   STENT TO OM         DR COOPER   ENDARTERECTOMY Right 08/08/2014   Procedure: RIGHT CAROTID ENDARTERECTOMY WITH HEMASHIELD PATCH ANGIOPLASTY;  Surgeon: Elam Dutch, MD;  Location: [place];  Service: Vascular;  Laterality: Right;   FOOT SURGERY Left    HAND SURGERY Bilateral    for ganglion cysts   LAPAROTOMY N/A 08/01/2013   Procedure: EXPLORATORY LAPAROTOMY;  Surgeon: Harl Bowie, MD;  Location: [place];  Service: General;  Laterality: N/A;   LEFT HEART CATHETERIZATION WITH CORONARY ANGIOGRAM N/A 12/20/2012   Procedure: LEFT HEART CATHETERIZATION WITH CORONARY ANGIOGRAM;  Surgeon: Blane Ohara, MD;  Location: [facility] CATH LAB;  Service: Cardiovascular;  Laterality: N/A;   PARTIAL COLECTOMY N/A 08/01/2013   Procedure: PARTIAL COLECTOMY;  Surgeon: Harl Bowie, MD;  Location: [place];  Service: General;  Laterality: N/A;   ROTATOR CUFF REPAIR Left    TONSILLECTOMY     TRACHEOSTOMY     feinstein    Current Medications: Current Meds  Medication Sig   aspirin EC 81  MG tablet Take 162 mg by mouth daily.   atorvastatin (LIPITOR) 80 MG tablet Take 80 mg by mouth every evening.    buPROPion (WELLBUTRIN XL) 150 MG 24 hr tablet Take 1 tablet (150 mg total) by mouth daily.   Clobetasol Prop Emollient Base 0.05 % emollient cream    docusate sodium (COLACE) 100 MG capsule Take 100 mg by mouth as needed.    fish oil-omega-3 fatty acids 1000 MG capsule Take 1,000 mg by mouth daily.    folic acid (FOLVITE) 409 MCG tablet Take 800 mcg by mouth daily.    metoprolol succinate (TOPROL-XL) 25 MG 24 hr tablet TAKE ONE-HALF TABLET BY  MOUTH DAILY   mirtazapine (REMERON) 45  MG tablet Take 45 mg by mouth at bedtime.    nitroGLYCERIN (NITROSTAT) 0.4 MG SL tablet Place 1 tablet (0.4 mg total) under the tongue every 5 (five) minutes as needed for chest pain.   ondansetron (ZOFRAN) 4 MG tablet Take 4 mg by mouth every 8 (eight) hours as needed for nausea or vomiting.    pantoprazole (PROTONIX) 40 MG tablet Take 40 mg by mouth daily.    sertraline (ZOLOFT) 100 MG tablet Take 100 mg by mouth 2 (two) times daily.   [DISCONTINUED] nitroGLYCERIN (NITROSTAT) 0.4 MG SL tablet DISSOLVE 1 TABLET UNDER THE TONGUE EVERY 5 MINUTES AS  NEEDED FOR CHEST PAIN UP TO 3 DOSES, THEN SEEK MEDICAL  ATTENTION     Allergies:   Pregabalin and Sulfonamide derivatives   Social History   Socioeconomic History   Marital status: Married    Spouse name: Not on file   Number of children: Not on file   Years of education: Not on file   Highest education level: Not on file  Occupational History   Occupation: full time  Social Designer, fashion/clothing strain: Not on file   Food insecurity    Worry: Not on file    Inability: Not on file   Transportation needs    Medical: Not on file    Non-medical: Not on file  Tobacco Use   Smoking status: Current Some Day Smoker    Packs/day: 0.25    Years: 53.00    Pack years: 13.25    Types: Cigarettes   Smokeless tobacco: Never  Used   Tobacco comment: also uses e cig, 4 A DAY  Substance and Sexual Activity   Alcohol use: No    Alcohol/week: 0.0 standard drinks   Drug use: No   Sexual activity: Not Currently    Birth control/protection: Surgical  Lifestyle   Physical activity    Days per week: Not on file    Minutes per session: Not on file   Stress: Not on file  Relationships   Social connections    Talks on phone: Not on file    Gets together: Not on file    Attends religious service: Not on file    Active member of club or organization: Not on file    Attends meetings of clubs or organizations: Not on file    Relationship status: Not on file  Other Topics Concern   Not on file  Social History Narrative   Not on file     Family History: The patient's family history includes AAA (abdominal aortic aneurysm) in her father; Heart attack in her father; Heart disease in her father and unknown relative; Varicose Veins in her mother.  ROS:   Please see the history of present illness.    All other systems reviewed and are negative.  EKGs/Labs/Other Studies Reviewed:    The following studies were reviewed today:  Carotid US: 08/16/2018 Right Carotid: Velocities in the right ICA are consistent with a 40-59% stenosis. Left Carotid: Velocities in the left ICA are consistent with a 40-59% stenosis.  EKG:  EKG is ordered today.  The ekg ordered today demonstrates sinus rhythm with PACs, otherwise normal EKG unchanged from prior.  Recent Labs: No results found for requested labs within last 8760 hours.   Recent Lipid Panel    Component Value Date/Time   CHOL 150 01/30/2016 1052   TRIG 138 01/30/2016 1052   HDL 38 (L) 01/30/2016 1052   CHOLHDL 3.9 01/30/2016  1052   CHOLHDL 2.6 08/09/2014 0400   VLDL 19 08/09/2014 0400   LDLCALC 84 01/30/2016 1052    Physical Exam:    VS:  BP 126/76    Pulse 78    Ht 5' (1.524 m)    Wt 168 lb 12.8 oz (76.6 kg)    SpO2 96%    BMI 32.97 kg/m     Wt  Readings from Last 3 Encounters:  10/01/18 168 lb 12.8 oz (76.6 kg)  08/16/18 167 lb (75.8 kg)  11/05/17 176 lb (79.8 kg)     GEN: Well nourished, well developed in no acute distress HEENT: Normal NECK: No JVD; No carotid bruits LYMPHATICS: No lymphadenopathy CARDIAC: RRR, no murmurs, rubs, gallops RESPIRATORY:  Clear to auscultation without rales, wheezing or rhonchi  ABDOMEN: Soft, non-tender, non-distended MUSCULOSKELETAL:  No edema; No deformity  SKIN: Warm and dry NEUROLOGIC:  Alert and oriented x 3 PSYCHIATRIC:  Normal affect   ASSESSMENT:    1. Coronary artery disease involving native coronary artery of native heart without angina pectoris   2. Essential hypertension   3. Acute right arterial ischemic stroke, middle cerebral artery (MCA) ([place])   4. Bilateral carotid artery stenosis   5. Cerebrovascular accident (CVA) due to embolism of right middle cerebral artery ([place])   6. Bradycardia    PLAN:    In order of problems listed above:  1.  Recurrent falls -does not seem to be cardiac in etiology, however I will obtain 1 week ZIO patch monitor to evaluate for any heart blocks or arrhythmias.  Her EKG today is completely normal with ventricular rate 76 bpm.  I will discontinue metoprolol.  2. CAD- stable - Severe left circumflex and OM disease treated successfully with PCI x 2 on 12/20/2012. DAPT d/c-ed in 12/15. Negative stress test on 11/07/2013.  Continue ASA and atorvastatin, decrease dose of metoprolol to 12.5 mg po BID.  3. Chronic diastolic CHF -continue Lasix 20 mg po daily x 1 week.  She appears euvolemic  4. Hypertension - controlled, continue current management.   5. Hyperlipidemia - on high dose of atorvastatin 80 mg PO QHS, normal LFTs. Tolerated well.  6. Carotid disease - s/p right carotid endarterectomy - complicated by stroke. Followed by Dr Oneida Alar. No bruit.  Most recent carotid ultrasound in July 2020 showed stable 40 to 60% stenosis  bilaterally.  Echocardiogram 08/09/2014  - Left ventricle: The cavity size was normal. There was mild   concentric hypertrophy. Systolic function was vigorous. The   estimated ejection fraction was in the range of 65% to 70%. Wall   motion was normal; there were no regional wall motion   abnormalities. Doppler parameters are consistent with elevated   ventricular end-diastolic filling pressure. - Aortic valve: Trileaflet; normal thickness leaflets. There was no   regurgitation. - Aortic root: The aortic root was normal in size. - Left atrium: The atrium was mildly dilated. - Right ventricle: Systolic function was normal. - Right atrium: The atrium was normal in size. - Tricuspid valve: There was trivial regurgitation. - Pulmonic valve: There was no regurgitation. - Pulmonary arteries: Systolic pressure was within the normal   range. - Inferior vena cava: The vessel was normal in size. - Pericardium, extracardiac: There was no pericardial effusion.  Medication Adjustments/Labs and Tests Ordered: Current medicines are reviewed at length with the patient today.  Concerns regarding medicines are outlined above.  Orders Placed This Encounter  Procedures   LONG TERM MONITOR (3-14 DAYS)  EKG 12-Lead   Meds ordered this encounter  Medications   nitroGLYCERIN (NITROSTAT) 0.4 MG SL tablet    Sig: Place 1 tablet (0.4 mg total) under the tongue every 5 (five) minutes as needed for chest pain.    Dispense:  25 tablet    Refill:  4    Patient Instructions  Your physician has recommended you make the following change in your medication:  STOP METOPROLOL   ZIO PATCH   Your physician wants you to follow-up in: [place] will receive a reminder letter in the mail two months in advance. If you don't receive a letter, please call our office to schedule the follow-up appointment.     Signed, Ena Dawley, MD  10/02/2018 8:00 PM    [place]

## 2018-10-07 ENCOUNTER — Telehealth: Payer: Self-pay | Admitting: *Deleted

## 2018-10-07 NOTE — Telephone Encounter (Signed)
14 day ZIO XT long term holter monitor to be mailed to the patients home.  Instructions reviewed briefly as they are included in the monitor kit. 

## 2018-10-12 ENCOUNTER — Ambulatory Visit (INDEPENDENT_AMBULATORY_CARE_PROVIDER_SITE_OTHER): Payer: Medicare Other

## 2018-10-12 DIAGNOSIS — R001 Bradycardia, unspecified: Secondary | ICD-10-CM

## 2018-10-26 DIAGNOSIS — S299XXA Unspecified injury of thorax, initial encounter: Secondary | ICD-10-CM | POA: Diagnosis not present

## 2018-10-26 DIAGNOSIS — S199XXA Unspecified injury of neck, initial encounter: Secondary | ICD-10-CM | POA: Diagnosis not present

## 2018-10-26 DIAGNOSIS — G4489 Other headache syndrome: Secondary | ICD-10-CM | POA: Diagnosis not present

## 2018-10-26 DIAGNOSIS — R0789 Other chest pain: Secondary | ICD-10-CM | POA: Diagnosis not present

## 2018-10-26 DIAGNOSIS — R000 Tachycardia, unspecified: Secondary | ICD-10-CM | POA: Diagnosis not present

## 2018-10-26 DIAGNOSIS — M542 Cervicalgia: Secondary | ICD-10-CM | POA: Diagnosis not present

## 2018-10-26 DIAGNOSIS — R0781 Pleurodynia: Secondary | ICD-10-CM | POA: Diagnosis not present

## 2018-10-26 DIAGNOSIS — S0990XA Unspecified injury of head, initial encounter: Secondary | ICD-10-CM | POA: Diagnosis not present

## 2018-10-26 DIAGNOSIS — M545 Low back pain: Secondary | ICD-10-CM | POA: Diagnosis not present

## 2018-10-26 DIAGNOSIS — R519 Headache, unspecified: Secondary | ICD-10-CM | POA: Diagnosis not present

## 2018-10-26 DIAGNOSIS — S4991XA Unspecified injury of right shoulder and upper arm, initial encounter: Secondary | ICD-10-CM | POA: Diagnosis not present

## 2018-10-26 DIAGNOSIS — M25511 Pain in right shoulder: Secondary | ICD-10-CM | POA: Diagnosis not present

## 2018-10-28 DIAGNOSIS — R55 Syncope and collapse: Secondary | ICD-10-CM | POA: Diagnosis not present

## 2018-10-28 DIAGNOSIS — I10 Essential (primary) hypertension: Secondary | ICD-10-CM | POA: Diagnosis not present

## 2018-10-28 DIAGNOSIS — R42 Dizziness and giddiness: Secondary | ICD-10-CM | POA: Diagnosis not present

## 2018-10-28 DIAGNOSIS — G44309 Post-traumatic headache, unspecified, not intractable: Secondary | ICD-10-CM | POA: Diagnosis not present

## 2018-11-02 ENCOUNTER — Telehealth: Payer: Self-pay

## 2018-11-02 DIAGNOSIS — H818X1 Other disorders of vestibular function, right ear: Secondary | ICD-10-CM | POA: Diagnosis not present

## 2018-11-02 DIAGNOSIS — I472 Ventricular tachycardia, unspecified: Secondary | ICD-10-CM

## 2018-11-02 DIAGNOSIS — M549 Dorsalgia, unspecified: Secondary | ICD-10-CM | POA: Diagnosis not present

## 2018-11-02 DIAGNOSIS — R269 Unspecified abnormalities of gait and mobility: Secondary | ICD-10-CM | POA: Diagnosis not present

## 2018-11-02 DIAGNOSIS — I471 Supraventricular tachycardia: Secondary | ICD-10-CM

## 2018-11-02 DIAGNOSIS — H8190 Unspecified disorder of vestibular function, unspecified ear: Secondary | ICD-10-CM | POA: Diagnosis not present

## 2018-11-02 DIAGNOSIS — R001 Bradycardia, unspecified: Secondary | ICD-10-CM | POA: Diagnosis not present

## 2018-11-02 NOTE — Telephone Encounter (Signed)
Notes recorded by Theodoro Parma, RN on 11/02/2018 at 4:52 PM EDT  Reviewed with Dr. Meda Coffee, who ordered:  1) echo  2) EP referral  3) restart metoprolol (held at last visit)   Spoke with Dr. Macky Lower nurse.  Will arrange appointment with Dr. Curt Bears 10/20 at [address] [facility] office)  Or 10/26 at [address] [facility] office)  Called patient to review recommendations.  Left message to call back.

## 2018-11-02 NOTE — Telephone Encounter (Signed)
[name] from [facility] called with monitor findings. The patient wore a ZIO patch from 9/22 to 10/2. During that time, she had 11 runs of Vtach with average rate of 166 bpm and the longest episode being 12.4 seconds. She also had 380 runs of SVT.  The monitor report is not available for viewing. Requested the monitor technician to upload the monitor ASAP and upload has been completed.

## 2018-11-02 NOTE — Telephone Encounter (Signed)
-----   Message from Dorothy Spark, MD sent at 11/02/2018  4:46 PM EDT -----  ----- Message ----- From: Dorothy Spark, MD Sent: 11/02/2018   4:44 PM EDT To: Dorothy Spark, MD

## 2018-11-03 DIAGNOSIS — I6523 Occlusion and stenosis of bilateral carotid arteries: Secondary | ICD-10-CM | POA: Diagnosis not present

## 2018-11-03 DIAGNOSIS — R42 Dizziness and giddiness: Secondary | ICD-10-CM | POA: Diagnosis not present

## 2018-11-03 DIAGNOSIS — S0990XA Unspecified injury of head, initial encounter: Secondary | ICD-10-CM | POA: Diagnosis not present

## 2018-11-03 NOTE — Telephone Encounter (Signed)
Called the patient and reviewed monitor results in detail. She states the day after she sent her monitor back, she fell in the yard and thinks she may have fainted. She hit her head and is "pretty banged up." She saw her PCP and was sent to the ED via ambulance. They are doing a full Neurology work up. Instructed patient to RESTART her metoprolol.  Scheduled her for echocardiogram 10/16 at 1505. Scheduled her for EP consult with Dr. Curt Bears 10/20. She was grateful for assistance.

## 2018-11-05 ENCOUNTER — Ambulatory Visit (HOSPITAL_COMMUNITY): Payer: Medicare Other | Attending: Cardiology

## 2018-11-05 ENCOUNTER — Other Ambulatory Visit: Payer: Self-pay

## 2018-11-05 DIAGNOSIS — I472 Ventricular tachycardia, unspecified: Secondary | ICD-10-CM

## 2018-11-05 DIAGNOSIS — I471 Supraventricular tachycardia: Secondary | ICD-10-CM | POA: Insufficient documentation

## 2018-11-09 ENCOUNTER — Ambulatory Visit: Payer: Medicare Other | Admitting: Cardiology

## 2018-11-09 ENCOUNTER — Other Ambulatory Visit: Payer: Self-pay

## 2018-11-09 ENCOUNTER — Encounter: Payer: Self-pay | Admitting: Cardiology

## 2018-11-09 VITALS — BP 128/76 | HR 66 | Ht 60.0 in | Wt 170.2 lb

## 2018-11-09 DIAGNOSIS — I472 Ventricular tachycardia, unspecified: Secondary | ICD-10-CM

## 2018-11-09 MED ORDER — METOPROLOL SUCCINATE ER 50 MG PO TB24: 50.0000 mg | ORAL_TABLET | Freq: Every day | ORAL | 1 refills | Status: DC

## 2018-11-09 NOTE — Patient Instructions (Addendum)
Medication Instructions:  Your physician has recommended you make the following change in your medication:  1. INCREASE Toprol to 50 mg once daily  * If you need a refill on your cardiac medications before your next appointment, please call your pharmacy.   Labwork: None ordered   Testing/Procedures: None ordered  Follow-Up: You are scheduled for a 3 month follow up with Dr. Curt Bears, in the [place] office, on 02/07/2019 @ 10:00 am   Thank you for choosing [facility]!!   Trinidad Curet, RN [phone]

## 2018-11-09 NOTE — Progress Notes (Signed)
Electrophysiology Office Note   Date:  11/09/2018   ID:  Jahnyla, [place] Oct 09, 1940, MRN [id number]  PCP:  [name], MD  Cardiologist:  Meda Coffee Primary Electrophysiologist:  Will Meredith Leeds, MD    Chief Complaint: falls   History of Present Illness: KALANDRA MASTERS is a 78 y.o. female who is being seen today for the evaluation of NSVT at the request of Ottie Glazier. Presenting today for electrophysiology evaluation.  History of hyperlipidemia, hypertension, PAD, and coronary artery disease.  She is a lifelong smoker.  She underwent left heart catheterization November 2014 showing severe OM1 stenosis and received 2 drug-eluting stents.  She had a carotid endarterectomy in 4098 complicated by postoperative stroke.  This resulted in left hemiplegia.  She presented to cardiology clinic complaining of frequent falls.  These started in May.   She is unsure if she trips over something or if she just falls.  There is no prodromal symptoms.  Orthostatic hypotension was ruled out.  She has seen an ear specialist who ruled out abnormalities.  2 weeks ago she had a subsequent episode where she fell.  Her husband says that she lost consciousness at the time.  She was working in the yard with a weed eater at the time.  She had not changed positions.  Since that time she has been restarted on her metoprolol and has had no further falls with improvements in her dizziness.  Today, she denies symptoms of palpitations, chest pain, shortness of breath, orthopnea, PND, lower extremity edema, claudication, dizziness, presyncope, syncope, bleeding, or neurologic sequela. The patient is tolerating medications without difficulties.    Past Medical History:  Diagnosis Date  . Acute respiratory failure with hypoxia s/p tracheostomy 07/2013  . Anemia    a. 7-08/2013 felt due to recent critical illness/chronic disease.  . Arthritis    handds, knees & back   . Barrett's esophagus   . CAD  (coronary artery disease)    a. Mod dz 2010 initially mgd medically. b. 12/2012 - angina s/p PTCA/DES to mid-circumflex, PTCA/DES to first OM.   Marland Kitchen Carotid artery disease ([place])    a. 60-70% bilat ICA stenosis by dopplers 05/2012.  Marland Kitchen Chronic back pain    deteriorating;DDD  . Colonic obstruction due to suspected colitis; s/p colectomy/colostomy    a. See SBO.  Marland Kitchen Complication of anesthesia    hard to wake up from anesthesia   . COPD (chronic obstructive pulmonary disease) ([place])   . Depression    takes Remeron and Zoloft daily  . Dizziness    was on Bentyl which caused this-took off of it and no problems since  . DVT of axillary vein, acute right ([place])    a. Dx 07/2013 felt due to R IJ central line that had been inserted 7/14. Not on anticoag due to GIB issues and small cerebral bleed.  Marland Kitchen GERD (gastroesophageal reflux disease)   . Heart murmur   . History of bronchitis    > 63yrs ago   . History of colon polyps   . History of hiatal hernia   . History of kidney stones    was told it was stable but doesn't know for sure that she ever passed it  . HTN (hypertension)   . Hypertension    takes Metoprolol daily  . Intracranial hemorrhage ([place])    a. 08/11/13 - per DC summary, tiny SAH vs SDH done for altered mentation, anticoag stopped including aspirin.  . Joint pain   .  Neck pain    DDD  . Other and unspecified hyperlipidemia    takes Lipitor daily  . Pneumonia    hx of->15 yrs ago  . Protein-calorie malnutrition, severe w/ electrolyte imbalance    a. Severe hypoalbuminemia leading to 3rd spacing including anasarca and pulm edema 07/2013.  Marland Kitchen SBO (small bowel obstruction) ([place])    a. Lysis of adhesions and ovarian cystectomy 04/2013 for sBO. b. Admitted 07/2013 for colonic obstruction due to suspected colitis, s/p partial colectomy/colostomy 08/01/13. Admission complicated by anasarca, acute resp failure requiring tracheostomy, decannulated 08/25/13. c. Recurrent SBO8/2015, NGT placed for  decompression.   . Stroke [facility]) early 80's   right sided weakness--TIA   Past Surgical History:  Procedure Laterality Date  . ABDOMINAL HYSTERECTOMY    . APPENDECTOMY    . BOWEL RESECTION N/A 08/01/2013   Procedure: SMALL BOWEL RESECTION;  Surgeon: Harl Bowie, MD;  Location: [place];  Service: General;  Laterality: N/A;  . cataract surgery Bilateral   . CHOLECYSTECTOMY    . COLON RESECTION    . COLON SURGERY    . COLOSTOMY N/A 08/01/2013   Procedure: COLOSTOMY;  Surgeon: Harl Bowie, MD;  Location: [place];  Service: General;  Laterality: N/A;  . COLOSTOMY TAKEDOWN  01/24/2014   dr blackman  . COLOSTOMY TAKEDOWN N/A 01/24/2014   Procedure: COLOSTOMY TAKEDOWN;  Surgeon: Coralie Keens, MD;  Location: [place];  Service: General;  Laterality: N/A;  . CORONARY ANGIOPLASTY WITH STENT PLACEMENT  12/20/2012   STENT TO OM         DR COOPER  . ENDARTERECTOMY Right 08/08/2014   Procedure: RIGHT CAROTID ENDARTERECTOMY WITH HEMASHIELD PATCH ANGIOPLASTY;  Surgeon: Elam Dutch, MD;  Location: [place];  Service: Vascular;  Laterality: Right;  . FOOT SURGERY Left   . HAND SURGERY Bilateral    for ganglion cysts  . LAPAROTOMY N/A 08/01/2013   Procedure: EXPLORATORY LAPAROTOMY;  Surgeon: Harl Bowie, MD;  Location: [place];  Service: General;  Laterality: N/A;  . LEFT HEART CATHETERIZATION WITH CORONARY ANGIOGRAM N/A 12/20/2012   Procedure: LEFT HEART CATHETERIZATION WITH CORONARY ANGIOGRAM;  Surgeon: Blane Ohara, MD;  Location[facility] CATH LAB;  Service: Cardiovascular;  Laterality: N/A;  . PARTIAL COLECTOMY N/A 08/01/2013   Procedure: PARTIAL COLECTOMY;  Surgeon: Harl Bowie, MD;  Location: [place];  Service: General;  Laterality: N/A;  . ROTATOR CUFF REPAIR Left   . TONSILLECTOMY    . TRACHEOSTOMY     feinstein     Current Outpatient Medications  Medication Sig Dispense Refill  . aspirin EC 81 MG tablet Take 162 mg by mouth daily.    Marland Kitchen atorvastatin (LIPITOR) 80 MG tablet  Take 80 mg by mouth every evening.     Marland Kitchen buPROPion (WELLBUTRIN XL) 150 MG 24 hr tablet Take 1 tablet (150 mg total) by mouth daily. 90 tablet 3  . Clobetasol Prop Emollient Base 0.05 % emollient cream     . docusate sodium (COLACE) 100 MG capsule Take 100 mg by mouth as needed.     . fish oil-omega-3 fatty acids 1000 MG capsule Take 1,000 mg by mouth daily.     . folic acid (FOLVITE) 010 MCG tablet Take 800 mcg by mouth daily.     . metoprolol succinate (TOPROL-XL) 25 MG 24 hr tablet Take 25 mg by mouth daily.    . mirtazapine (REMERON) 45 MG tablet Take 45 mg by mouth at bedtime.     Marland Kitchen  nitroGLYCERIN (NITROSTAT) 0.4 MG SL tablet Place 1 tablet (0.4 mg total) under the tongue every 5 (five) minutes as needed for chest pain. 25 tablet 4  . ondansetron (ZOFRAN) 4 MG tablet Take 4 mg by mouth every 8 (eight) hours as needed for nausea or vomiting.     . pantoprazole (PROTONIX) 40 MG tablet Take 40 mg by mouth daily.     . sertraline (ZOLOFT) 100 MG tablet Take 100 mg by mouth 2 (two) times daily.     No current facility-administered medications for this visit.     Allergies:   Pregabalin and Sulfonamide derivatives   Social History:  The patient  reports that she has been smoking cigarettes. She has a 13.25 pack-year smoking history. She has never used smokeless tobacco. She reports that she does not drink alcohol or use drugs.   Family History:  The patient's family history includes AAA (abdominal aortic aneurysm) in her father; Heart attack in her father; Heart disease in her father and another family member; Varicose Veins in her mother.    ROS:  Please see the history of present illness.   Otherwise, review of systems is positive for none.   All other systems are reviewed and negative.    PHYSICAL EXAM: VS:  BP 128/76   Pulse 66   Ht 5' (1.524 m)   Wt 170 lb 3.2 oz (77.2 kg)   SpO2 98%   BMI 33.24 kg/m  , BMI Body mass index is 33.24 kg/m. GEN: Well nourished, well developed, in no  acute distress  HEENT: normal  Neck: no JVD, carotid bruits, or masses Cardiac: RRR; no murmurs, rubs, or gallops,no edema  Respiratory:  clear to auscultation bilaterally, normal work of breathing GI: soft, nontender, nondistended, + BS MS: no deformity or atrophy  Skin: warm and dry Neuro:  Strength and sensation are intact Psych: euthymic mood, full affect  EKG:  EKG is ordered today. Personal review of the ekg ordered shows SR, rate 66  Recent Labs: No results found for requested labs within last 8760 hours.    Lipid Panel     Component Value Date/Time   CHOL 150 01/30/2016 1052   TRIG 138 01/30/2016 1052   HDL 38 (L) 01/30/2016 1052   CHOLHDL 3.9 01/30/2016 1052   CHOLHDL 2.6 08/09/2014 0400   VLDL 19 08/09/2014 0400   LDLCALC 84 01/30/2016 1052     Wt Readings from Last 3 Encounters:  11/09/18 170 lb 3.2 oz (77.2 kg)  10/01/18 168 lb 12.8 oz (76.6 kg)  08/16/18 167 lb (75.8 kg)      Other studies Reviewed: Additional studies/ records that were reviewed today include: TTE 11/05/18  Review of the above records today demonstrates:   1. Left ventricular ejection fraction, by visual estimation, is 60 to 65%. The left ventricle has normal function. Normal left ventricular size. There is no left ventricular hypertrophy.  2. Global right ventricle has normal systolic function.The right ventricular size is normal. No increase in right ventricular wall thickness.  3. The right atrium is normal.  4. The left atrium is moderate dilated.  5. The mitral valve is rheumatic. Mild doming of the anterior mitral valve leaflet. Mild mitral valve stenosis by observation. Mild mitral valve stenosis. Mitral valve area PHT measures 1.75 cm. MV peak gradient 13.1 mmHg and mean gradient 66mmHg. Mild  to moderate mitral valve regurgitation, with eccentric laterally directed jet.  6. Tricuspid valve is normal. Tricuspid regurgitation was not visualized by  color flow Doppler.  7. The aortic  valve is normal in structure. Aortic valve regurgitation was not visualized by color flow Doppler. Structurally normal aortic valve, with no evidence of sclerosis or stenosis.  8. The pulmonic valve was normal in structure. Pulmonic valve regurgitation is trivial by color flow Doppler.  9. TR signal is inadequate for assessing pulmonary artery systolic pressure. 10. There was no pericardial effusion.  Cardiac monitor 11/02/18 personally reviewed  Sinus rhythm to sinus tachycardia.  Frequent nsVTs with maximum rate 177 BPM, the longest lasting 12 seconds.  Frequent short runs of SVTs.   Sinus rhythm to sinus tachycardia. Frequent nsVTs with maximum rate 177 BPM, the longest lasting 12 seconds.  ASSESSMENT AND PLAN:  1.  Ventricular tachycardia: Noted on her ZIO monitor.  She did not have symptoms of falls or loss of consciousness while wearing the monitor.  Her metoprolol was restarted since the monitor results have returned and she has not had much in the way of symptoms of falls or dizziness.  Due to that, I will increase her Toprol-XL to 50 mg daily.  I have asked her to call me if she continues to have dizziness or falls.  I told her that she may benefit from amiodarone.  We did discuss the possibility of a defibrillator, but she would prefer to avoid this if at all possible.  Due to her episode of syncope, I have told her no driving for 6 months per [facility].  2.  Hypertension: Currently well controlled  3.  Coronary artery disease: No current chest pain.  Continue current management.  4.  SVT: Increasing Toprol-XL.  Would like to avoid ablation.   Current medicines are reviewed at length with the patient today.   The patient does not have concerns regarding her medicines.  The following changes were made today: Increase Toprol-XL  Labs/ tests ordered today include:  Orders Placed This Encounter  Procedures  . EKG 12-Lead   Case discussed with referring cardiologist   Disposition:   FU with Will Camnitz 3 months  Signed, Will Meredith Leeds, MD  11/09/2018 8:39 AM     [facility] [address] [place] [address] [phone] (office) [phone] (fax)

## 2018-11-18 DIAGNOSIS — I10 Essential (primary) hypertension: Secondary | ICD-10-CM | POA: Diagnosis not present

## 2018-11-18 DIAGNOSIS — R739 Hyperglycemia, unspecified: Secondary | ICD-10-CM | POA: Diagnosis not present

## 2018-11-18 DIAGNOSIS — E785 Hyperlipidemia, unspecified: Secondary | ICD-10-CM | POA: Diagnosis not present

## 2018-11-18 DIAGNOSIS — D539 Nutritional anemia, unspecified: Secondary | ICD-10-CM | POA: Diagnosis not present

## 2018-11-18 DIAGNOSIS — M1990 Unspecified osteoarthritis, unspecified site: Secondary | ICD-10-CM | POA: Diagnosis not present

## 2019-01-08 ENCOUNTER — Inpatient Hospital Stay (HOSPITAL_COMMUNITY): Payer: Medicare Other

## 2019-01-08 ENCOUNTER — Encounter (HOSPITAL_COMMUNITY): Payer: Self-pay | Admitting: Cardiology

## 2019-01-08 ENCOUNTER — Inpatient Hospital Stay (HOSPITAL_COMMUNITY)
Admission: AD | Admit: 2019-01-08 | Discharge: 2019-01-11 | DRG: 281 | Disposition: A | Discharge status: Home / Self Care | Payer: Medicare Other | Source: Other Acute Inpatient Hospital | Attending: Cardiovascular Disease | Admitting: Cardiovascular Disease

## 2019-01-08 ENCOUNTER — Other Ambulatory Visit: Payer: Self-pay

## 2019-01-08 DIAGNOSIS — E782 Mixed hyperlipidemia: Secondary | ICD-10-CM | POA: Diagnosis not present

## 2019-01-08 DIAGNOSIS — I2583 Coronary atherosclerosis due to lipid rich plaque: Secondary | ICD-10-CM | POA: Diagnosis not present

## 2019-01-08 DIAGNOSIS — I5033 Acute on chronic diastolic (congestive) heart failure: Secondary | ICD-10-CM | POA: Diagnosis present

## 2019-01-08 DIAGNOSIS — I081 Rheumatic disorders of both mitral and tricuspid valves: Secondary | ICD-10-CM | POA: Diagnosis present

## 2019-01-08 DIAGNOSIS — Z955 Presence of coronary angioplasty implant and graft: Secondary | ICD-10-CM | POA: Diagnosis not present

## 2019-01-08 DIAGNOSIS — Z888 Allergy status to other drugs, medicaments and biological substances status: Secondary | ICD-10-CM | POA: Diagnosis not present

## 2019-01-08 DIAGNOSIS — E1151 Type 2 diabetes mellitus with diabetic peripheral angiopathy without gangrene: Secondary | ICD-10-CM | POA: Diagnosis present

## 2019-01-08 DIAGNOSIS — I5032 Chronic diastolic (congestive) heart failure: Secondary | ICD-10-CM | POA: Diagnosis present

## 2019-01-08 DIAGNOSIS — I2584 Coronary atherosclerosis due to calcified coronary lesion: Secondary | ICD-10-CM | POA: Diagnosis present

## 2019-01-08 DIAGNOSIS — R0789 Other chest pain: Secondary | ICD-10-CM | POA: Diagnosis not present

## 2019-01-08 DIAGNOSIS — F17210 Nicotine dependence, cigarettes, uncomplicated: Secondary | ICD-10-CM | POA: Diagnosis present

## 2019-01-08 DIAGNOSIS — I313 Pericardial effusion (noninflammatory): Secondary | ICD-10-CM | POA: Diagnosis present

## 2019-01-08 DIAGNOSIS — Z882 Allergy status to sulfonamides status: Secondary | ICD-10-CM | POA: Diagnosis not present

## 2019-01-08 DIAGNOSIS — I472 Ventricular tachycardia: Secondary | ICD-10-CM | POA: Diagnosis present

## 2019-01-08 DIAGNOSIS — I340 Nonrheumatic mitral (valve) insufficiency: Secondary | ICD-10-CM | POA: Diagnosis not present

## 2019-01-08 DIAGNOSIS — Z8719 Personal history of other diseases of the digestive system: Secondary | ICD-10-CM | POA: Diagnosis not present

## 2019-01-08 DIAGNOSIS — I110 Hypertensive heart disease with heart failure: Secondary | ICD-10-CM | POA: Diagnosis present

## 2019-01-08 DIAGNOSIS — R0602 Shortness of breath: Secondary | ICD-10-CM | POA: Diagnosis not present

## 2019-01-08 DIAGNOSIS — Z720 Tobacco use: Secondary | ICD-10-CM | POA: Diagnosis not present

## 2019-01-08 DIAGNOSIS — F329 Major depressive disorder, single episode, unspecified: Secondary | ICD-10-CM | POA: Diagnosis present

## 2019-01-08 DIAGNOSIS — I371 Nonrheumatic pulmonary valve insufficiency: Secondary | ICD-10-CM | POA: Diagnosis not present

## 2019-01-08 DIAGNOSIS — Z20828 Contact with and (suspected) exposure to other viral communicable diseases: Secondary | ICD-10-CM | POA: Diagnosis present

## 2019-01-08 DIAGNOSIS — R000 Tachycardia, unspecified: Secondary | ICD-10-CM | POA: Diagnosis not present

## 2019-01-08 DIAGNOSIS — E119 Type 2 diabetes mellitus without complications: Secondary | ICD-10-CM | POA: Diagnosis not present

## 2019-01-08 DIAGNOSIS — I10 Essential (primary) hypertension: Secondary | ICD-10-CM | POA: Diagnosis present

## 2019-01-08 DIAGNOSIS — I25119 Atherosclerotic heart disease of native coronary artery with unspecified angina pectoris: Secondary | ICD-10-CM | POA: Diagnosis present

## 2019-01-08 DIAGNOSIS — M159 Polyosteoarthritis, unspecified: Secondary | ICD-10-CM | POA: Diagnosis not present

## 2019-01-08 DIAGNOSIS — J449 Chronic obstructive pulmonary disease, unspecified: Secondary | ICD-10-CM | POA: Diagnosis not present

## 2019-01-08 DIAGNOSIS — Z743 Need for continuous supervision: Secondary | ICD-10-CM | POA: Diagnosis not present

## 2019-01-08 DIAGNOSIS — E785 Hyperlipidemia, unspecified: Secondary | ICD-10-CM | POA: Diagnosis not present

## 2019-01-08 DIAGNOSIS — E118 Type 2 diabetes mellitus with unspecified complications: Secondary | ICD-10-CM

## 2019-01-08 DIAGNOSIS — I2510 Atherosclerotic heart disease of native coronary artery without angina pectoris: Secondary | ICD-10-CM | POA: Diagnosis present

## 2019-01-08 DIAGNOSIS — Z7982 Long term (current) use of aspirin: Secondary | ICD-10-CM

## 2019-01-08 DIAGNOSIS — I63411 Cerebral infarction due to embolism of right middle cerebral artery: Secondary | ICD-10-CM | POA: Diagnosis present

## 2019-01-08 DIAGNOSIS — Z79899 Other long term (current) drug therapy: Secondary | ICD-10-CM

## 2019-01-08 DIAGNOSIS — Z8249 Family history of ischemic heart disease and other diseases of the circulatory system: Secondary | ICD-10-CM | POA: Diagnosis not present

## 2019-01-08 DIAGNOSIS — I739 Peripheral vascular disease, unspecified: Secondary | ICD-10-CM | POA: Diagnosis present

## 2019-01-08 DIAGNOSIS — I214 Non-ST elevation (NSTEMI) myocardial infarction: Secondary | ICD-10-CM | POA: Diagnosis not present

## 2019-01-08 DIAGNOSIS — R079 Chest pain, unspecified: Secondary | ICD-10-CM

## 2019-01-08 DIAGNOSIS — F17200 Nicotine dependence, unspecified, uncomplicated: Secondary | ICD-10-CM | POA: Diagnosis present

## 2019-01-08 DIAGNOSIS — K219 Gastro-esophageal reflux disease without esophagitis: Secondary | ICD-10-CM | POA: Diagnosis not present

## 2019-01-08 DIAGNOSIS — I69354 Hemiplegia and hemiparesis following cerebral infarction affecting left non-dominant side: Secondary | ICD-10-CM

## 2019-01-08 LAB — COMPREHENSIVE METABOLIC PANEL
ALT: 133 U/L — ABNORMAL HIGH (ref 0–44)
AST: 327 U/L — ABNORMAL HIGH (ref 15–41)
Albumin: 3.6 g/dL (ref 3.5–5.0)
Alkaline Phosphatase: 177 U/L — ABNORMAL HIGH (ref 38–126)
Anion gap: 9 (ref 5–15)
BUN: 13 mg/dL (ref 8–23)
CO2: 22 mmol/L (ref 22–32)
Calcium: 8.7 mg/dL — ABNORMAL LOW (ref 8.9–10.3)
Chloride: 110 mmol/L (ref 98–111)
Creatinine, Ser: 0.83 mg/dL (ref 0.44–1.00)
GFR calc Af Amer: >60 mL/min (ref >60)
GFR calc non Af Amer: >60 mL/min (ref >60)
Glucose, Bld: 120 mg/dL — ABNORMAL HIGH (ref 70–99)
Potassium: 4 mmol/L (ref 3.5–5.1)
Sodium: 141 mmol/L (ref 135–145)
Total Bilirubin: 0.6 mg/dL (ref 0.3–1.2)
Total Protein: 6.2 g/dL — ABNORMAL LOW (ref 6.5–8.1)

## 2019-01-08 LAB — CBC WITH DIFFERENTIAL/PLATELET
Abs Immature Granulocytes: 0.03 10*3/uL (ref 0.00–0.07)
Basophils Absolute: 0 10*3/uL (ref 0.0–0.1)
Basophils Relative: 0 %
Eosinophils Absolute: 0 10*3/uL (ref 0.0–0.5)
Eosinophils Relative: 0 %
HCT: 38.8 % (ref 36.0–46.0)
Hemoglobin: 12.1 g/dL (ref 12.0–15.0)
Immature Granulocytes: 0 %
Lymphocytes Relative: 26 %
Lymphs Abs: 2.4 10*3/uL (ref 0.7–4.0)
MCH: 28.1 pg (ref 26.0–34.0)
MCHC: 31.2 g/dL (ref 30.0–36.0)
MCV: 90 fL (ref 80.0–100.0)
Monocytes Absolute: 0.6 10*3/uL (ref 0.1–1.0)
Monocytes Relative: 6 %
Neutro Abs: 6.2 10*3/uL (ref 1.7–7.7)
Neutrophils Relative %: 68 %
Platelets: 190 10*3/uL (ref 150–400)
RBC: 4.31 MIL/uL (ref 3.87–5.11)
RDW: 13.6 % (ref 11.5–15.5)
WBC: 9.2 10*3/uL (ref 4.0–10.5)
nRBC: 0 % (ref 0.0–0.2)

## 2019-01-08 LAB — HEMOGLOBIN A1C
Hgb A1c MFr Bld: 6.5 % — ABNORMAL HIGH (ref 4.8–5.6)
Mean Plasma Glucose: 139.85 mg/dL

## 2019-01-08 LAB — APTT: aPTT: 195 seconds (ref 24–36)

## 2019-01-08 LAB — MAGNESIUM: Magnesium: 1.8 mg/dL (ref 1.7–2.4)

## 2019-01-08 LAB — TROPONIN I (HIGH SENSITIVITY): Troponin I (High Sensitivity): 14276 ng/L (ref <18)

## 2019-01-08 LAB — T4, FREE: Free T4: 0.9 ng/dL (ref 0.61–1.12)

## 2019-01-08 LAB — TSH: TSH: 4.964 u[IU]/mL — ABNORMAL HIGH (ref 0.350–4.500)

## 2019-01-08 LAB — BRAIN NATRIURETIC PEPTIDE: B Natriuretic Peptide: 186.4 pg/mL — ABNORMAL HIGH (ref 0.0–100.0)

## 2019-01-08 LAB — HEPARIN LEVEL (UNFRACTIONATED): Heparin Unfractionated: 0.88 IU/mL — ABNORMAL HIGH (ref 0.30–0.70)

## 2019-01-08 MED ORDER — OMEGA-3 FATTY ACIDS 1000 MG PO CAPS: 1000.0000 mg | ORAL_CAPSULE | Freq: Every day | ORAL | Status: DC

## 2019-01-08 MED ORDER — SERTRALINE HCL 100 MG PO TABS
100.0000 mg | ORAL_TABLET | Freq: Two times a day (BID) | ORAL | Status: DC
  Administered 2019-01-09 – 2019-01-11 (×5): 100 mg via ORAL
  Filled 2019-01-08 (×5): qty 1

## 2019-01-08 MED ORDER — HEPARIN (PORCINE) 25000 UT/250ML-% IV SOLN
1000.0000 [IU]/h | INTRAVENOUS | Status: DC
  Administered 2019-01-09: 1000 [IU]/h via INTRAVENOUS
  Filled 2019-01-08: qty 250

## 2019-01-08 MED ORDER — NITROGLYCERIN IN D5W 200-5 MCG/ML-% IV SOLN
2.0000 ug/min | INTRAVENOUS | Status: DC
  Administered 2019-01-08: 10 ug/min via INTRAVENOUS
  Administered 2019-01-09: 30 ug/min via INTRAVENOUS
  Filled 2019-01-08: qty 250

## 2019-01-08 MED ORDER — ATORVASTATIN CALCIUM 40 MG PO TABS: 40.0000 mg | ORAL_TABLET | Freq: Every day | ORAL | Status: DC

## 2019-01-08 MED ORDER — ONDANSETRON HCL 4 MG/2ML IJ SOLN: 4.0000 mg | Freq: Four times a day (QID) | INTRAMUSCULAR | Status: DC | PRN

## 2019-01-08 MED ORDER — NITROGLYCERIN 0.4 MG SL SUBL: 0.4000 mg | SUBLINGUAL_TABLET | SUBLINGUAL | Status: DC | PRN

## 2019-01-08 MED ORDER — ASPIRIN EC 81 MG PO TBEC
81.0000 mg | DELAYED_RELEASE_TABLET | Freq: Every day | ORAL | Status: DC
  Administered 2019-01-09 – 2019-01-11 (×3): 81 mg via ORAL
  Filled 2019-01-08 (×3): qty 1

## 2019-01-08 MED ORDER — ATORVASTATIN CALCIUM 80 MG PO TABS
80.0000 mg | ORAL_TABLET | Freq: Every day | ORAL | Status: DC
  Administered 2019-01-09 – 2019-01-10 (×2): 80 mg via ORAL
  Filled 2019-01-08 (×2): qty 1

## 2019-01-08 MED ORDER — MIRTAZAPINE 15 MG PO TABS
45.0000 mg | ORAL_TABLET | Freq: Every day | ORAL | Status: DC
  Administered 2019-01-09 – 2019-01-10 (×3): 45 mg via ORAL
  Filled 2019-01-08 (×3): qty 3
  Filled 2019-01-08: qty 1
  Filled 2019-01-08: qty 3

## 2019-01-08 MED ORDER — PANTOPRAZOLE SODIUM 40 MG PO TBEC
40.0000 mg | DELAYED_RELEASE_TABLET | Freq: Every day | ORAL | Status: DC
  Administered 2019-01-09 – 2019-01-11 (×3): 40 mg via ORAL
  Filled 2019-01-08 (×3): qty 1

## 2019-01-08 MED ORDER — OMEGA-3-ACID ETHYL ESTERS 1 G PO CAPS
1.0000 g | ORAL_CAPSULE | Freq: Every day | ORAL | Status: DC
  Administered 2019-01-09 – 2019-01-11 (×3): 1 g via ORAL
  Filled 2019-01-08 (×3): qty 1

## 2019-01-08 MED ORDER — METOPROLOL TARTRATE 25 MG PO TABS
25.0000 mg | ORAL_TABLET | Freq: Two times a day (BID) | ORAL | Status: DC
  Administered 2019-01-08 – 2019-01-11 (×6): 25 mg via ORAL
  Filled 2019-01-08 (×6): qty 1

## 2019-01-08 MED ORDER — FOLIC ACID 1 MG PO TABS
1.0000 mg | ORAL_TABLET | Freq: Every day | ORAL | Status: DC
  Administered 2019-01-09 – 2019-01-11 (×3): 1 mg via ORAL
  Filled 2019-01-08 (×3): qty 1

## 2019-01-08 MED ORDER — ACETAMINOPHEN 325 MG PO TABS
650.0000 mg | ORAL_TABLET | ORAL | Status: DC | PRN
  Administered 2019-01-09 – 2019-01-10 (×5): 650 mg via ORAL
  Filled 2019-01-08 (×5): qty 2

## 2019-01-08 MED ORDER — NICOTINE 7 MG/24HR TD PT24
7.0000 mg | MEDICATED_PATCH | Freq: Every day | TRANSDERMAL | Status: DC
  Administered 2019-01-09 – 2019-01-11 (×3): 7 mg via TRANSDERMAL
  Filled 2019-01-08 (×3): qty 1

## 2019-01-08 NOTE — Progress Notes (Signed)
ANTICOAGULATION CONSULT NOTE - Initial Consult  Pharmacy Consult for heparin Indication: chest pain/ACS  Allergies  Allergen Reactions  . Pregabalin Swelling    Tongue swelling  . Sulfonamide Derivatives Swelling    Childhood reaction    Patient Measurements: Height: 5' (152.4 cm) Weight: 167 lb 11.2 oz (76.1 kg) IBW/kg (Calculated) : 45.5 Heparin Dosing Weight: 76kg  Vital Signs: Temp: 98.2 F (36.8 C) (12/19 2126) Temp Source: Oral (12/19 2126) BP: 151/84 (12/19 2126) Pulse Rate: 122 (12/19 2126)  Labs: No results for input(s): HGB, HCT, PLT, APTT, LABPROT, INR, HEPARINUNFRC, HEPRLOWMOCWT, CREATININE, CKTOTAL, CKMB, TROPONINIHS in the last 72 hours.  CrCl cannot be calculated (Patient's most recent lab result is older than the maximum 21 days allowed.).   Medical History: Past Medical History:  Diagnosis Date  . Acute respiratory failure with hypoxia s/p tracheostomy 07/2013  . Anemia    a. 7-08/2013 felt due to recent critical illness/chronic disease.  . Arthritis    handds, knees & back   . Barrett's esophagus   . CAD (coronary artery disease)    a. Mod dz 2010 initially mgd medically. b. 12/2012 - angina s/p PTCA/DES to mid-circumflex, PTCA/DES to first OM.   Marland Kitchen Carotid artery disease ([place])    a. 60-70% bilat ICA stenosis by dopplers 05/2012.  Marland Kitchen Chronic back pain    deteriorating;DDD  . Colonic obstruction due to suspected colitis; s/p colectomy/colostomy    a. See SBO.  Marland Kitchen Complication of anesthesia    hard to wake up from anesthesia   . COPD (chronic obstructive pulmonary disease) ([place])   . Depression    takes Remeron and Zoloft daily  . Dizziness    was on Bentyl which caused this-took off of it and no problems since  . DVT of axillary vein, acute right ([place])    a. Dx 07/2013 felt due to R IJ central line that had been inserted 7/14. Not on anticoag due to GIB issues and small cerebral bleed.  Marland Kitchen GERD (gastroesophageal reflux disease)   . Heart murmur   .  History of bronchitis    > 40yrs ago   . History of colon polyps   . History of hiatal hernia   . History of kidney stones    was told it was stable but doesn't know for sure that she ever passed it  . HTN (hypertension)   . Hypertension    takes Metoprolol daily  . Intracranial hemorrhage ([place])    a. 08/11/13 - per DC summary, tiny SAH vs SDH done for altered mentation, anticoag stopped including aspirin.  . Joint pain   . Neck pain    DDD  . Other and unspecified hyperlipidemia    takes Lipitor daily  . Pneumonia    hx of->15 yrs ago  . Protein-calorie malnutrition, severe w/ electrolyte imbalance    a. Severe hypoalbuminemia leading to 3rd spacing including anasarca and pulm edema 07/2013.  Marland Kitchen SBO (small bowel obstruction) ([place])    a. Lysis of adhesions and ovarian cystectomy 04/2013 for sBO. b. Admitted 07/2013 for colonic obstruction due to suspected colitis, s/p partial colectomy/colostomy 08/01/13. Admission complicated by anasarca, acute resp failure requiring tracheostomy, decannulated 08/25/13. c. Recurrent SBO8/2015, NGT placed for decompression.   . Stroke [facility]) early 80's   right sided weakness--TIA     Assessment: [name] transferred from OSH on IV heparin for CP. H/H wnl at OSH, no AC noted PTA.  Goal of Therapy:  Heparin level 0.3-0.7 units/ml Monitor  platelets by anticoagulation protocol: Yes   Plan:  -Continue heparin 1000 units/h -Check heparin level in 6hr (started ~1930)    Arrie Senate, PharmD, BCPS Clinical Pharmacist [phone] Please check AMION for all [place] numbers 01/08/2019

## 2019-01-08 NOTE — H&P (Addendum)
Cardiology Admission History and Physical:   Patient ID: [name] MRN: [id number]; DOB: Oct 01, 1940   Admission date: 01/08/2019  Primary Care Provider: [name], MD Primary Cardiologist: Ena Dawley, MD  Primary Electrophysiologist:  Constance Haw, MD   Chief Complaint:  Chest pain  Patient Profile:   [name] is a 78 y.o. female with CAD status post DES x2 in 2014, PAD, hypertension, hyperlipidemia, NSVT, ongoing tobacco abuse, CVA in the setting of a CEA who presents with NSTEMI.  History of Present Illness:   [name] reports that she was in her usual state of health until approximately 11 AM this morning.  She reports at that time she was getting dressed when she developed sudden onset severe 10/10 central chest pressure which radiated to her bilateral shoulders.  She also developed profuse diaphoresis.  She denies shortness of breath or nausea.  She did attempt to take 2 sublingual nitroglycerin but without significant relief of her chest pain at which point she called EMS and was taken to [facility] for further care.  Prior to the event today, the patient reports that she has had more fatigued than usual for the last 1 to 2 weeks but denies any episodes of chest pain ever.  She is active only around the house but denied any symptoms with vacuuming or mopping previously.  With regards to her prior coronary history, the patient underwent PCI of the mid circumflex and first OM in 12/2012.  She denies any symptoms since that time until today.  Upon arrival to [facility] the patient was noted to be hemodynamically stable.  She was treated with additional sublingual nitroglycerin and morphine but continued to have 4/10 chest pain at the time of my evaluation.  Her laboratory work at [employer] [employer] showed a troponin I which increased from 0.03-1.24.  Given this, she was transferred to [facility] for further care.  Heart Pathway Score:       Past Medical History:  Diagnosis Date  . Acute respiratory failure with hypoxia s/p tracheostomy 07/2013  . Anemia    a. 7-08/2013 felt due to recent critical illness/chronic disease.  . Arthritis    handds, knees & back   . Barrett's esophagus   . CAD (coronary artery disease)    a. Mod dz 2010 initially mgd medically. b. 12/2012 - angina s/p PTCA/DES to mid-circumflex, PTCA/DES to first OM.   Marland Kitchen Carotid artery disease ([place])    a. 60-70% bilat ICA stenosis by dopplers 05/2012.  Marland Kitchen Chronic back pain    deteriorating;DDD  . Colonic obstruction due to suspected colitis; s/p colectomy/colostomy    a. See SBO.  Marland Kitchen Complication of anesthesia    hard to wake up from anesthesia   . COPD (chronic obstructive pulmonary disease) ([place])   . Depression    takes Remeron and Zoloft daily  . Dizziness    was on Bentyl which caused this-took off of it and no problems since  . DVT of axillary vein, acute right ([place])    a. Dx 07/2013 felt due to R IJ central line that had been inserted 7/14. Not on anticoag due to GIB issues and small cerebral bleed.  Marland Kitchen GERD (gastroesophageal reflux disease)   . Heart murmur   . History of bronchitis    > 85yrs ago   . History of colon polyps   . History of hiatal hernia   . History of kidney stones    was told it was stable  but doesn't know for sure that she ever passed it  . HTN (hypertension)   . Hypertension    takes Metoprolol daily  . Intracranial hemorrhage ([place])    a. 08/11/13 - per DC summary, tiny SAH vs SDH done for altered mentation, anticoag stopped including aspirin.  . Joint pain   . Neck pain    DDD  . Other and unspecified hyperlipidemia    takes Lipitor daily  . Pneumonia    hx of->15 yrs ago  . Protein-calorie malnutrition, severe w/ electrolyte imbalance    a. Severe hypoalbuminemia leading to 3rd spacing including anasarca and pulm edema 07/2013.  Marland Kitchen SBO (small bowel obstruction) ([place])    a. Lysis of adhesions and ovarian cystectomy 04/2013  for sBO. b. Admitted 07/2013 for colonic obstruction due to suspected colitis, s/p partial colectomy/colostomy 08/01/13. Admission complicated by anasarca, acute resp failure requiring tracheostomy, decannulated 08/25/13. c. Recurrent SBO8/2015, NGT placed for decompression.   . Stroke [facility]) early 80's   right sided weakness--TIA    Past Surgical History:  Procedure Laterality Date  . ABDOMINAL HYSTERECTOMY    . APPENDECTOMY    . BOWEL RESECTION N/A 08/01/2013   Procedure: SMALL BOWEL RESECTION;  Surgeon: Harl Bowie, MD;  Location: [place];  Service: General;  Laterality: N/A;  . cataract surgery Bilateral   . CHOLECYSTECTOMY    . COLON RESECTION    . COLON SURGERY    . COLOSTOMY N/A 08/01/2013   Procedure: COLOSTOMY;  Surgeon: Harl Bowie, MD;  Location: [place];  Service: General;  Laterality: N/A;  . COLOSTOMY TAKEDOWN  01/24/2014   dr blackman  . COLOSTOMY TAKEDOWN N/A 01/24/2014   Procedure: COLOSTOMY TAKEDOWN;  Surgeon: Coralie Keens, MD;  Location: [place];  Service: General;  Laterality: N/A;  . CORONARY ANGIOPLASTY WITH STENT PLACEMENT  12/20/2012   STENT TO OM         DR COOPER  . ENDARTERECTOMY Right 08/08/2014   Procedure: RIGHT CAROTID ENDARTERECTOMY WITH HEMASHIELD PATCH ANGIOPLASTY;  Surgeon: Elam Dutch, MD;  Location: [place];  Service: Vascular;  Laterality: Right;  . FOOT SURGERY Left   . HAND SURGERY Bilateral    for ganglion cysts  . LAPAROTOMY N/A 08/01/2013   Procedure: EXPLORATORY LAPAROTOMY;  Surgeon: Harl Bowie, MD;  Location: [place];  Service: General;  Laterality: N/A;  . LEFT HEART CATHETERIZATION WITH CORONARY ANGIOGRAM N/A 12/20/2012   Procedure: LEFT HEART CATHETERIZATION WITH CORONARY ANGIOGRAM;  Surgeon: Blane Ohara, MD;  Location: [facility] CATH LAB;  Service: Cardiovascular;  Laterality: N/A;  . PARTIAL COLECTOMY N/A 08/01/2013   Procedure: PARTIAL COLECTOMY;  Surgeon: Harl Bowie, MD;  Location: [place];  Service: General;   Laterality: N/A;  . ROTATOR CUFF REPAIR Left   . TONSILLECTOMY    . TRACHEOSTOMY     feinstein     Medications Prior to Admission: Prior to Admission medications   Medication Sig Start Date End Date Taking? Authorizing Provider  aspirin EC 81 MG tablet Take 162 mg by mouth daily.   Yes [provider]  atorvastatin (LIPITOR) 80 MG tablet Take 80 mg by mouth every evening.    Yes [provider]  metoprolol succinate (TOPROL-XL) 50 MG 24 hr tablet Take 1 tablet (50 mg total) by mouth daily. Take with or immediately following a meal. 11/09/18 02/07/19 Yes Camnitz, Will Hassell Done, MD  mirtazapine (REMERON) 45 MG tablet Take 45 mg by mouth at bedtime.  06/29/18  Yes [provider]  nitroGLYCERIN (NITROSTAT) 0.4 MG SL tablet Place 1 tablet (0.4 mg total) under the tongue every 5 (five) minutes as needed for chest pain. 10/01/18  Yes Nahser, Wonda Cheng, MD  sertraline (ZOLOFT) 100 MG tablet Take 100 mg by mouth 2 (two) times daily. 06/27/14  Yes [provider]  buPROPion (WELLBUTRIN XL) 150 MG 24 hr tablet Take 1 tablet (150 mg total) by mouth daily. 03/02/15   Dorothy Spark, MD  Clobetasol Prop Emollient Base 0.05 % emollient cream  08/11/18   [provider]  docusate sodium (COLACE) 100 MG capsule Take 100 mg by mouth as needed.     [provider]  fish oil-omega-3 fatty acids 1000 MG capsule Take 1,000 mg by mouth daily.     [provider]  folic acid (FOLVITE) 595 MCG tablet Take 800 mcg by mouth daily.     [provider]  ondansetron (ZOFRAN) 4 MG tablet Take 4 mg by mouth every 8 (eight) hours as needed for nausea or vomiting.     [provider]  pantoprazole (PROTONIX) 40 MG tablet Take 40 mg by mouth daily.  03/19/17   [provider]     Allergies:    Allergies  Allergen Reactions  . Pregabalin Swelling    Tongue swelling  . Sulfonamide Derivatives Swelling    Childhood reaction    Social  History:   Social History   Socioeconomic History  . Marital status: Married    Spouse name: Not on file  . Number of children: Not on file  . Years of education: Not on file  . Highest education level: Not on file  Occupational History  . Occupation: full time  Tobacco Use  . Smoking status: Current Some Day Smoker    Packs/day: 0.25    Years: 53.00    Pack years: 13.25    Types: Cigarettes  . Smokeless tobacco: Never Used  . Tobacco comment: also uses e cig, 4 A DAY  Substance and Sexual Activity  . Alcohol use: No    Alcohol/week: 0.0 standard drinks  . Drug use: No  . Sexual activity: Not Currently    Birth control/protection: Surgical  Other Topics Concern  . Not on file  Social History Narrative  . Not on file   Social Determinants of Health   Financial Resource Strain:   . Difficulty of Paying Living Expenses: Not on file  Food Insecurity:   . Worried About Charity fundraiser in the Last Year: Not on file  . Ran Out of Food in the Last Year: Not on file  Transportation Needs:   . Lack of Transportation (Medical): Not on file  . Lack of Transportation (Non-Medical): Not on file  Physical Activity:   . Days of Exercise per Week: Not on file  . Minutes of Exercise per Session: Not on file  Stress:   . Feeling of Stress : Not on file  Social Connections:   . Frequency of Communication with Friends and Family: Not on file  . Frequency of Social Gatherings with Friends and Family: Not on file  . Attends Religious Services: Not on file  . Active Member of Clubs or Organizations: Not on file  . Attends Archivist Meetings: Not on file  . Marital Status: Not on file  Intimate Partner Violence:   . Fear of Current or Ex-Partner: Not on file  . Emotionally Abused: Not on file  . Physically Abused: Not  on file  . Sexually Abused: Not on file    Family History:   The patient's family history includes AAA (abdominal aortic aneurysm) in her father; Heart  attack in her father; Heart disease in her father and another family member; Varicose Veins in her mother.    ROS:  Please see the history of present illness.  All other ROS reviewed and negative.     Physical Exam/Data:   Vitals:   01/08/19 2126 01/08/19 2237  BP: (!) 151/84   Pulse: (!) 122 88  Resp: 19   Temp: 98.2 F (36.8 C)   TempSrc: Oral   SpO2: 96%   Weight: 76.1 kg   Height: 5' (1.524 m)    No intake or output data in the 24 hours ending 01/08/19 2307 Last 3 Weights 01/08/2019 11/09/2018 10/01/2018  Weight (lbs) 167 lb 11.2 oz 170 lb 3.2 oz 168 lb 12.8 oz  Weight (kg) 76.068 kg 77.202 kg 76.567 kg     Body mass index is 32.75 kg/m.  General:  Well nourished, well developed, in no acute distress but appears somewhat uncomfortable HEENT: normal Lymph: no adenopathy Neck: no JVD Endocrine:  No thryomegaly Vascular: No carotid bruits; FA pulses 2+ bilaterally without bruits  Cardiac:  normal S1, S2; RRR; no murmur  Lungs:  clear to auscultation bilaterally, no wheezing, rhonchi or rales  Abd: soft, nontender, no hepatomegaly  Ext: no edema Musculoskeletal:  No deformities, BUE and BLE strength normal and equal Skin: warm and dry  Neuro:  CNs 2-12 intact, no focal abnormalities noted Psych:  Normal affect   EKG:  The ECG that was done 01/08/19 was personally reviewed and demonstrates normal sinus rhythm without acute ischemic changes  Relevant CV Studies: TTE 11/05/18  1. Left ventricular ejection fraction, by visual estimation, is 60 to 65%. The left ventricle has normal function. Normal left ventricular size. There is no left ventricular hypertrophy.  2. Global right ventricle has normal systolic function.The right ventricular size is normal. No increase in right ventricular wall thickness.  3. The right atrium is normal.  4. The left atrium is moderate dilated.  5. The mitral valve is rheumatic. Mild doming of the anterior mitral valve leaflet. Mild mitral  valve stenosis by observation. Mild mitral valve stenosis. Mitral valve area PHT measures 1.75 cm. MV peak gradient 13.1 mmHg and mean gradient 13mmHg. Mild  to moderate mitral valve regurgitation, with eccentric laterally directed jet.  6. Tricuspid valve is normal. Tricuspid regurgitation was not visualized by color flow Doppler.  7. The aortic valve is normal in structure. Aortic valve regurgitation was not visualized by color flow Doppler. Structurally normal aortic valve, with no evidence of sclerosis or stenosis.  8. The pulmonic valve was normal in structure. Pulmonic valve regurgitation is trivial by color flow Doppler.  9. TR signal is inadequate for assessing pulmonary artery systolic pressure. 10. There was no pericardial effusion.  Laboratory Data:  High Sensitivity Troponin:  No results for input(s): TROPONINIHS in the last 720 hours.    ChemistryNo results for input(s): NA, K, CL, CO2, GLUCOSE, BUN, CREATININE, CALCIUM, GFRNONAA, GFRAA, ANIONGAP in the last 168 hours.  No results for input(s): PROT, ALBUMIN, AST, ALT, ALKPHOS, BILITOT in the last 168 hours. Hematology Recent Labs  Lab 01/08/19 2233  WBC 9.2  RBC 4.31  HGB 12.1  HCT 38.8  MCV 90.0  MCH 28.1  MCHC 31.2  RDW 13.6  PLT 190   BNPNo results for input(s): BNP, PROBNP  in the last 168 hours.  DDimer No results for input(s): DDIMER in the last 168 hours.   Radiology/Studies:  No results found.     TIMI Risk Score for Unstable Angina or Non-ST Elevation MI:   The patient's TIMI risk score is 6, which indicates a 41% risk of all cause mortality, new or recurrent myocardial infarction or need for urgent revascularization in the next 14 days.   Assessment and Plan:   [name] is a 78 y.o. female with CAD status post DES x2 in 2014, PAD, hypertension, hyperlipidemia, NSVT, ongoing tobacco abuse, CVA in the setting of a CEA who presents with severe CP, found to have NSTEMI.  # NSTEMI # HL Patient  with severe crushing chest pain at 11 AM this morning troponin positive at [facility] and now has returned at greater than 14,000 from her high-sensitivity troponin on arrival here. She was started on a nitro drip slightly after arrival and chest pain is down to 1/10. No ST segment changes on repeat EKGs. -Continue heparin drip -Continue nitro drip, should patient have worsening chest pain or unable to get chest pain-free with ongoing medical management, will consider taking her to the Cath Lab this evening. -Echocardiogram in the morning -Trend troponin levels -Lipid panel, A1c, TFTs -Discussed at length the importance of full tobacco cessation. The patient is aware and is actively trying to quit smoking. Nicotine patch while inpatient and tobacco cessation counseling - Cardiac rehab -Atorvastatin 80 mg daily -Metoprolol 25 mg twice daily -We will plan for heart catheterization on Monday unless patient cannot be kept chest pain-free in the interim  # HTN BP elevated in the setting of pain. Cont to monitor, may need additional anti-hypertensives - Cont metoprolol   # Tobacco abuse - Nicotine patch - Tobacco cessation counseling  # Depression - Cont home sertraline 100mg  BID - Cont home mirtazapine  # DMII New diagnosis based on A1C 6.5% on admission  # FEN - Heart healthy diet  # FULL CODE   For questions or updates, please contact [facility] Please consult [email or web address] for contact info under     Signed, Bryna Colander, MD  01/08/2019 11:07 PM   0700 12/20: Patient was started on a nitro drip given mild ongoing chest pressure on arrival.  Chest pain subsequently resolved completely overnight.  High-sensitivity troponin continued to trend upwards but without EKG changes, peak troponin 21213.

## 2019-01-09 ENCOUNTER — Inpatient Hospital Stay (HOSPITAL_COMMUNITY): Payer: Medicare Other

## 2019-01-09 DIAGNOSIS — F17200 Nicotine dependence, unspecified, uncomplicated: Secondary | ICD-10-CM

## 2019-01-09 DIAGNOSIS — I371 Nonrheumatic pulmonary valve insufficiency: Secondary | ICD-10-CM

## 2019-01-09 DIAGNOSIS — I10 Essential (primary) hypertension: Secondary | ICD-10-CM

## 2019-01-09 DIAGNOSIS — I340 Nonrheumatic mitral (valve) insufficiency: Secondary | ICD-10-CM

## 2019-01-09 DIAGNOSIS — E782 Mixed hyperlipidemia: Secondary | ICD-10-CM

## 2019-01-09 DIAGNOSIS — I214 Non-ST elevation (NSTEMI) myocardial infarction: Principal | ICD-10-CM

## 2019-01-09 DIAGNOSIS — I2510 Atherosclerotic heart disease of native coronary artery without angina pectoris: Secondary | ICD-10-CM

## 2019-01-09 LAB — LIPID PANEL
Cholesterol: 117 mg/dL (ref 0–200)
HDL: 42 mg/dL (ref >40)
LDL Cholesterol: 58 mg/dL (ref 0–99)
Total CHOL/HDL Ratio: 2.8 RATIO
Triglycerides: 86 mg/dL (ref <150)
VLDL: 17 mg/dL (ref 0–40)

## 2019-01-09 LAB — TROPONIN I (HIGH SENSITIVITY): Troponin I (High Sensitivity): 21213 ng/L (ref <18)

## 2019-01-09 LAB — CBC
HCT: 35.9 % — ABNORMAL LOW (ref 36.0–46.0)
Hemoglobin: 11.7 g/dL — ABNORMAL LOW (ref 12.0–15.0)
MCH: 28.9 pg (ref 26.0–34.0)
MCHC: 32.6 g/dL (ref 30.0–36.0)
MCV: 88.6 fL (ref 80.0–100.0)
Platelets: 180 10*3/uL (ref 150–400)
RBC: 4.05 MIL/uL (ref 3.87–5.11)
RDW: 13.6 % (ref 11.5–15.5)
WBC: 8.1 10*3/uL (ref 4.0–10.5)
nRBC: 0 % (ref 0.0–0.2)

## 2019-01-09 LAB — BASIC METABOLIC PANEL
Anion gap: 7 (ref 5–15)
BUN: 13 mg/dL (ref 8–23)
CO2: 23 mmol/L (ref 22–32)
Calcium: 8.4 mg/dL — ABNORMAL LOW (ref 8.9–10.3)
Chloride: 109 mmol/L (ref 98–111)
Creatinine, Ser: 0.89 mg/dL (ref 0.44–1.00)
GFR calc Af Amer: >60 mL/min (ref >60)
GFR calc non Af Amer: >60 mL/min (ref >60)
Glucose, Bld: 162 mg/dL — ABNORMAL HIGH (ref 70–99)
Potassium: 3.9 mmol/L (ref 3.5–5.1)
Sodium: 139 mmol/L (ref 135–145)

## 2019-01-09 LAB — HEPARIN LEVEL (UNFRACTIONATED)
Heparin Unfractionated: 0.63 IU/mL (ref 0.30–0.70)
Heparin Unfractionated: 0.49 IU/mL (ref 0.30–0.70)

## 2019-01-09 LAB — ECHOCARDIOGRAM COMPLETE
Height: 60 in
Weight: 2687.85 oz

## 2019-01-09 LAB — GLUCOSE, CAPILLARY: Glucose-Capillary: 161 mg/dL — ABNORMAL HIGH (ref 70–99)

## 2019-01-09 MED ORDER — ASPIRIN 81 MG PO CHEW
81.0000 mg | CHEWABLE_TABLET | ORAL | Status: AC
  Administered 2019-01-10: 81 mg via ORAL
  Filled 2019-01-09: qty 1

## 2019-01-09 MED ORDER — SODIUM CHLORIDE 0.9 % WEIGHT BASED INFUSION
3.0000 mL/kg/h | INTRAVENOUS | Status: AC
  Administered 2019-01-10: 3 mL/kg/h via INTRAVENOUS

## 2019-01-09 MED ORDER — SODIUM CHLORIDE 0.9 % WEIGHT BASED INFUSION
1.0000 mL/kg/h | INTRAVENOUS | Status: DC
  Administered 2019-01-10: 250 mL via INTRAVENOUS

## 2019-01-09 MED ORDER — SODIUM CHLORIDE 0.9% FLUSH: 3.0000 mL | INTRAVENOUS | Status: DC | PRN

## 2019-01-09 NOTE — H&P (View-Only) (Signed)
Progress Note  Patient Name: [name] Date of Encounter: 01/09/2019  Primary Cardiologist: Dr. Katha Cabal EP: Dr. Reggy Eye  Subjective   No recurrent chest pain since yesterday  Inpatient Medications    Scheduled Meds: . aspirin EC  81 mg Oral Daily  . atorvastatin  80 mg Oral q1800  . folic acid  1 mg Oral Daily  . metoprolol tartrate  25 mg Oral BID  . mirtazapine  45 mg Oral QHS  . nicotine  7 mg Transdermal Daily  . omega-3 acid ethyl esters  1 g Oral Daily  . pantoprazole  40 mg Oral Daily  . sertraline  100 mg Oral BID   Continuous Infusions: . heparin 1,000 Units/hr (01/09/19 0700)  . nitroGLYCERIN 20 mcg/min (01/09/19 0700)   PRN Meds: acetaminophen, nitroGLYCERIN, ondansetron (ZOFRAN) IV   Vital Signs    Vitals:   01/08/19 2237 01/09/19 0013 01/09/19 0432 01/09/19 0739  BP:  130/78 137/60 (!) 124/59  Pulse: 88 86 80 77  Resp:  20 18   Temp:  98 F (36.7 C) 98.8 F (37.1 C)   TempSrc:  Oral Oral   SpO2:  99% 97% 98%  Weight:   76.2 kg   Height:        Intake/Output Summary (Last 24 hours) at 01/09/2019 1107 Last data filed at 01/09/2019 0700 Gross per 24 hour  Intake 137.19 ml  Output --  Net 137.19 ml    I/O since admission: +137  [facility] Weights   01/08/19 2126 01/09/19 0432  Weight: 76.1 kg 76.2 kg    Telemetry    Sinus rhythm- Personally Reviewed  ECG    ECG (independently read by me): Normal sinus rhythm at 82 bpm, no ST segment changes.  QTc interval 481 ms  Physical Exam   BP (!) 124/59   Pulse 77   Temp 98.8 F (37.1 C) (Oral)   Resp 18   Ht 5' (1.524 m)   Wt 76.2 kg   SpO2 98%   BMI 32.81 kg/m  General: Alert, oriented, no distress.  Skin: normal turgor, no rashes, warm and dry HEENT: Normocephalic, atraumatic. Pupils equal round and reactive to light; sclera anicteric; extraocular muscles intact;  Nose without nasal septal hypertrophy Mouth/Parynx benign; Mallinpatti scale Neck: No JVD,  no carotid bruits; normal carotid upstroke Lungs: Occasional rhonchi Chest wall: without tenderness to palpitation Heart: PMI not displaced, RRR, s1 s2 normal, 1/6 systolic murmur, no diastolic murmur, no rubs, gallops, thrills, or heaves Abdomen: soft, nontender; no hepatosplenomehaly, BS+; abdominal aorta nontender and not dilated by palpation. Back: no CVA tenderness Pulses 2+ Musculoskeletal: full range of motion, normal strength, no joint deformities Extremities: no clubbing cyanosis or edema, Homan's sign negative  Neurologic: grossly nonfocal; Cranial nerves grossly wnl Psychologic: Normal mood and affect   Labs    Chemistry Recent Labs  Lab 01/08/19 2233 01/09/19 0053  NA 141 139  K 4.0 3.9  CL 110 109  CO2 22 23  GLUCOSE 120* 162*  BUN 13 13  CREATININE 0.83 0.89  CALCIUM 8.7* 8.4*  PROT 6.2*  --   ALBUMIN 3.6  --   AST 327*  --   ALT 133*  --   ALKPHOS 177*  --   BILITOT 0.6  --   GFRNONAA >60 >60  GFRAA >60 >60  ANIONGAP 9 7     Hematology Recent Labs  Lab 01/08/19 2233 01/09/19 0053  WBC 9.2 8.1  RBC 4.31 4.05  HGB 12.1 11.7*  HCT 38.8 35.9*  MCV 90.0 88.6  MCH 28.1 28.9  MCHC 31.2 32.6  RDW 13.6 13.6  PLT 190 180    Cardiac EnzymesNo results for input(s): TROPONINI in the last 168 hours. No results for input(s): TROPIPOC in the last 168 hours.   BNP Recent Labs  Lab 01/08/19 2233  BNP 186.4*     HS Troponin: 14276 > 21,213  DDimer No results for input(s): DDIMER in the last 168 hours.   Lipid Panel     Component Value Date/Time   CHOL 117 01/09/2019 0053   CHOL 150 01/30/2016 1052   TRIG 86 01/09/2019 0053   HDL 42 01/09/2019 0053   HDL 38 (L) 01/30/2016 1052   CHOLHDL 2.8 01/09/2019 0053   VLDL 17 01/09/2019 0053   LDLCALC 58 01/09/2019 0053   LDLCALC 84 01/30/2016 1052     Radiology    Portable chest x-ray 1 view  Result Date: 01/09/2019 CLINICAL DATA:  Central and left-sided chest pain. EXAM: PORTABLE CHEST 1 VIEW  COMPARISON:  Radiograph and CT earlier this day at [place]: Mild cardiomegaly. Coronary stents visualized. Blunting of the left costophrenic angle likely combination of epicardial fat pad and small pleural effusion is seen on CT. No pulmonary edema. No new airspace disease. No pneumothorax. IMPRESSION: 1. Mild cardiomegaly. 2. Blunting of the left costophrenic angle likely combination of epicardial fat pad and small pleural effusion seen on CT. Electronically Signed   By: Keith Rake M.D.   On: 01/09/2019 00:02    Cardiac Studies   Echo just done, results pending  Patient Profile     [name] is a 78 y.o. female with CAD status post DES x2 in 2014, PAD, hypertension, hyperlipidemia, NSVT, ongoing tobacco abuse, CVA in the setting of a CEA who presents with severe CP, found to have NSTEMI.  Assessment & Plan    1. Non-STEMI/CAD: Patient is status post prior DES stenting x2 in 2014 to her left circumflex marginal 1 vessel.  She developed recurrent chest pain yesterday morning at approximately 10:30 AM which persisted until going to [facility].  She has since been pain-free now on IV nitroglycerin and IV heparin.  High-sensitivity troponin [place] at 14[address] > 21,213. ECG does not show any acute changes.  Suspect reperfusion with normal ECG.  Plan definitive left heart catheterization.  Since she is currently pain-free on IV heparin and nitroglycerin we will plan for tomorrow morning unless she develops recurrent unstable symptomatology today. The risks and benefits of a cardiac catheterization including, but not limited to, death, stroke, MI, kidney damage and bleeding were discussed with the patient who indicates understanding and agrees to proceed.   2.  PVD: She is status post right carotid endarterectomy July 1829 complicated by postoperative stroke in the recovery room resulting in mild residual left hemiplegia.  3.  Longstanding tobacco use: Smoking  for 65 years.  Absolute smoking cessation is imperative  4.  Hyperlipidemia: Currently on atorvastatin 80 mg; LDL at target at 58.  5.  Essential hypertension: BP currently well controlled on metoprolol 25 mg twice a day and intravenous nitroglycerin.  I have increased to 30 mcg.  6.  Mild mitral stenosis with rheumatic appearing mitral valve on echo October 2020 with mild to moderate mitral regurgitation.  Signed, Troy Sine, MD, [facility] 01/09/2019, 11:07 AM

## 2019-01-09 NOTE — Progress Notes (Signed)
CRITICAL VALUE ALERT  Critical Value:  Troponin=14,276  Date & Time Notied:  01/08/19 @ 23:50  Provider Notified: Dr Georgette Shell  Orders Received/Actions taken: Yes

## 2019-01-09 NOTE — Progress Notes (Signed)
  Echocardiogram 2D Echocardiogram has been performed.  [name] 01/09/2019, 10:24 AM

## 2019-01-09 NOTE — Progress Notes (Signed)
ANTICOAGULATION CONSULT NOTE - Follow-up Consult  Pharmacy Consult for heparin Indication: chest pain/ACS  Allergies  Allergen Reactions  . Pregabalin Swelling    Tongue swelling  . Sulfonamide Derivatives Swelling    Childhood reaction    Patient Measurements: Height: 5' (152.4 cm) Weight: 167 lb 15.9 oz (76.2 kg) IBW/kg (Calculated) : 45.5 Heparin Dosing Weight: 76kg  Vital Signs: Temp: 98.8 F (37.1 C) (12/20 0432) Temp Source: Oral (12/20 0432) BP: 104/50 (12/20 1613) Pulse Rate: 72 (12/20 1613)  Labs: Recent Labs    01/08/19 2233 01/09/19 0053 01/09/19 0621 01/09/19 1441  HGB 12.1 11.7*  --   --   HCT 38.8 35.9*  --   --   PLT 190 180  --   --   APTT 195*  --   --   --   HEPARINUNFRC 0.88*  --  0.63 0.49  CREATININE 0.83 0.89  --   --   TROPONINIHS 14,276* 21,213*  --   --     Estimated Creatinine Clearance: 46.8 mL/min (by C-G formula based on SCr of 0.89 mg/dL).   Medical History: Past Medical History:  Diagnosis Date  . Acute respiratory failure with hypoxia s/p tracheostomy 07/2013  . Anemia    a. 7-08/2013 felt due to recent critical illness/chronic disease.  . Arthritis    handds, knees & back   . Barrett's esophagus   . CAD (coronary artery disease)    a. Mod dz 2010 initially mgd medically. b. 12/2012 - angina s/p PTCA/DES to mid-circumflex, PTCA/DES to first OM.   Marland Kitchen Carotid artery disease ([place])    a. 60-70% bilat ICA stenosis by dopplers 05/2012.  Marland Kitchen Chronic back pain    deteriorating;DDD  . Colonic obstruction due to suspected colitis; s/p colectomy/colostomy    a. See SBO.  Marland Kitchen Complication of anesthesia    hard to wake up from anesthesia   . COPD (chronic obstructive pulmonary disease) ([place])   . Depression    takes Remeron and Zoloft daily  . Dizziness    was on Bentyl which caused this-took off of it and no problems since  . DVT of axillary vein, acute right ([place])    a. Dx 07/2013 felt due to R IJ central line that had been inserted  7/14. Not on anticoag due to GIB issues and small cerebral bleed.  Marland Kitchen GERD (gastroesophageal reflux disease)   . Heart murmur   . History of bronchitis    > 42yrs ago   . History of colon polyps   . History of hiatal hernia   . History of kidney stones    was told it was stable but doesn't know for sure that she ever passed it  . HTN (hypertension)   . Hypertension    takes Metoprolol daily  . Intracranial hemorrhage ([place])    a. 08/11/13 - per DC summary, tiny SAH vs SDH done for altered mentation, anticoag stopped including aspirin.  . Joint pain   . Neck pain    DDD  . Other and unspecified hyperlipidemia    takes Lipitor daily  . Pneumonia    hx of->15 yrs ago  . Protein-calorie malnutrition, severe w/ electrolyte imbalance    a. Severe hypoalbuminemia leading to 3rd spacing including anasarca and pulm edema 07/2013.  Marland Kitchen SBO (small bowel obstruction) ([place])    a. Lysis of adhesions and ovarian cystectomy 04/2013 for sBO. b. Admitted 07/2013 for colonic obstruction due to suspected colitis, s/p partial colectomy/colostomy 08/01/13. Admission complicated  by anasarca, acute resp failure requiring tracheostomy, decannulated 08/25/13. c. Recurrent SBO8/2015, NGT placed for decompression.   . Stroke [facility]) early 80's   right sided weakness--TIA    Assessment: 54 yoF transferred from OSH on IV heparin for CP. PMH includes CAD status post DES x2 in 2014, PAD, hypertension, hyperlipidemia, NSVT, ongoing tobacco abuse, CVA in the setting of a CEA. Pharmacy consulted for heparin dosing - heparin continued from OSH.  Heart cath planned for Monday, 12/21. Heparin remains therapeutic on recheck.  Goal of Therapy:  Heparin level 0.3-0.7 units/ml Monitor platelets by anticoagulation protocol: Yes   Plan:  -Continue heparin 1000 units/hr -Daily HL and CBC -Monitor for s/sx of bleeding   Arrie Senate, PharmD, BCPS Clinical Pharmacist [phone] Please check AMION for all [place]  numbers 01/09/2019

## 2019-01-09 NOTE — Progress Notes (Signed)
Received Rapid Covid 19 (Sars Cov 2) results from [facility] via fax shows "Not Detected". Result attached @ patient's chart.

## 2019-01-09 NOTE — Progress Notes (Signed)
Progress Note  Patient Name: [name] Date of Encounter: 01/09/2019  Primary Cardiologist: Dr. Katha Cabal EP: Dr. Reggy Eye  Subjective   No recurrent chest pain since yesterday  Inpatient Medications    Scheduled Meds: . aspirin EC  81 mg Oral Daily  . atorvastatin  80 mg Oral q1800  . folic acid  1 mg Oral Daily  . metoprolol tartrate  25 mg Oral BID  . mirtazapine  45 mg Oral QHS  . nicotine  7 mg Transdermal Daily  . omega-3 acid ethyl esters  1 g Oral Daily  . pantoprazole  40 mg Oral Daily  . sertraline  100 mg Oral BID   Continuous Infusions: . heparin 1,000 Units/hr (01/09/19 0700)  . nitroGLYCERIN 20 mcg/min (01/09/19 0700)   PRN Meds: acetaminophen, nitroGLYCERIN, ondansetron (ZOFRAN) IV   Vital Signs    Vitals:   01/08/19 2237 01/09/19 0013 01/09/19 0432 01/09/19 0739  BP:  130/78 137/60 (!) 124/59  Pulse: 88 86 80 77  Resp:  20 18   Temp:  98 F (36.7 C) 98.8 F (37.1 C)   TempSrc:  Oral Oral   SpO2:  99% 97% 98%  Weight:   76.2 kg   Height:        Intake/Output Summary (Last 24 hours) at 01/09/2019 1107 Last data filed at 01/09/2019 0700 Gross per 24 hour  Intake 137.19 ml  Output --  Net 137.19 ml    I/O since admission: +137  [facility] Weights   01/08/19 2126 01/09/19 0432  Weight: 76.1 kg 76.2 kg    Telemetry    Sinus rhythm- Personally Reviewed  ECG    ECG (independently read by me): Normal sinus rhythm at 82 bpm, no ST segment changes.  QTc interval 481 ms  Physical Exam   BP (!) 124/59   Pulse 77   Temp 98.8 F (37.1 C) (Oral)   Resp 18   Ht 5' (1.524 m)   Wt 76.2 kg   SpO2 98%   BMI 32.81 kg/m  General: Alert, oriented, no distress.  Skin: normal turgor, no rashes, warm and dry HEENT: Normocephalic, atraumatic. Pupils equal round and reactive to light; sclera anicteric; extraocular muscles intact;  Nose without nasal septal hypertrophy Mouth/Parynx benign; Mallinpatti scale Neck: No JVD,  no carotid bruits; normal carotid upstroke Lungs: Occasional rhonchi Chest wall: without tenderness to palpitation Heart: PMI not displaced, RRR, s1 s2 normal, 1/6 systolic murmur, no diastolic murmur, no rubs, gallops, thrills, or heaves Abdomen: soft, nontender; no hepatosplenomehaly, BS+; abdominal aorta nontender and not dilated by palpation. Back: no CVA tenderness Pulses 2+ Musculoskeletal: full range of motion, normal strength, no joint deformities Extremities: no clubbing cyanosis or edema, Homan's sign negative  Neurologic: grossly nonfocal; Cranial nerves grossly wnl Psychologic: Normal mood and affect   Labs    Chemistry Recent Labs  Lab 01/08/19 2233 01/09/19 0053  NA 141 139  K 4.0 3.9  CL 110 109  CO2 22 23  GLUCOSE 120* 162*  BUN 13 13  CREATININE 0.83 0.89  CALCIUM 8.7* 8.4*  PROT 6.2*  --   ALBUMIN 3.6  --   AST 327*  --   ALT 133*  --   ALKPHOS 177*  --   BILITOT 0.6  --   GFRNONAA >60 >60  GFRAA >60 >60  ANIONGAP 9 7     Hematology Recent Labs  Lab 01/08/19 2233 01/09/19 0053  WBC 9.2 8.1  RBC 4.31 4.05  HGB 12.1 11.7*  HCT 38.8 35.9*  MCV 90.0 88.6  MCH 28.1 28.9  MCHC 31.2 32.6  RDW 13.6 13.6  PLT 190 180    Cardiac EnzymesNo results for input(s): TROPONINI in the last 168 hours. No results for input(s): TROPIPOC in the last 168 hours.   BNP Recent Labs  Lab 01/08/19 2233  BNP 186.4*     HS Troponin: 14276 > 21,213  DDimer No results for input(s): DDIMER in the last 168 hours.   Lipid Panel     Component Value Date/Time   CHOL 117 01/09/2019 0053   CHOL 150 01/30/2016 1052   TRIG 86 01/09/2019 0053   HDL 42 01/09/2019 0053   HDL 38 (L) 01/30/2016 1052   CHOLHDL 2.8 01/09/2019 0053   VLDL 17 01/09/2019 0053   LDLCALC 58 01/09/2019 0053   LDLCALC 84 01/30/2016 1052     Radiology    Portable chest x-ray 1 view  Result Date: 01/09/2019 CLINICAL DATA:  Central and left-sided chest pain. EXAM: PORTABLE CHEST 1 VIEW  COMPARISON:  Radiograph and CT earlier this day at [place]: Mild cardiomegaly. Coronary stents visualized. Blunting of the left costophrenic angle likely combination of epicardial fat pad and small pleural effusion is seen on CT. No pulmonary edema. No new airspace disease. No pneumothorax. IMPRESSION: 1. Mild cardiomegaly. 2. Blunting of the left costophrenic angle likely combination of epicardial fat pad and small pleural effusion seen on CT. Electronically Signed   By: Keith Rake M.D.   On: 01/09/2019 00:02    Cardiac Studies   Echo just done, results pending  Patient Profile     [name] is a 78 y.o. female with CAD status post DES x2 in 2014, PAD, hypertension, hyperlipidemia, NSVT, ongoing tobacco abuse, CVA in the setting of a CEA who presents with severe CP, found to have NSTEMI.  Assessment & Plan    1. Non-STEMI/CAD: Patient is status post prior DES stenting x2 in 2014 to her left circumflex marginal 1 vessel.  She developed recurrent chest pain yesterday morning at approximately 10:30 AM which persisted until going to [facility].  She has since been pain-free now on IV nitroglycerin and IV heparin.  High-sensitivity troponin [place] increased at 14276 > 21,213. ECG does not show any acute changes.  Suspect reperfusion with normal ECG.  Plan definitive left heart catheterization.  Since she is currently pain-free on IV heparin and nitroglycerin we will plan for tomorrow morning unless she develops recurrent unstable symptomatology today. The risks and benefits of a cardiac catheterization including, but not limited to, death, stroke, MI, kidney damage and bleeding were discussed with the patient who indicates understanding and agrees to proceed.   2.  PVD: She is status post right carotid endarterectomy July 6237 complicated by postoperative stroke in the recovery room resulting in mild residual left hemiplegia.  3.  Longstanding tobacco use: Smoking  for 65 years.  Absolute smoking cessation is imperative  4.  Hyperlipidemia: Currently on atorvastatin 80 mg; LDL at target at 58.  5.  Essential hypertension: BP currently well controlled on metoprolol 25 mg twice a day and intravenous nitroglycerin.  I have increased to 30 mcg.  6.  Mild mitral stenosis with rheumatic appearing mitral valve on echo October 2020 with mild to moderate mitral regurgitation.  Signed, Troy Sine, MD, [facility] 01/09/2019, 11:07 AM

## 2019-01-09 NOTE — Progress Notes (Addendum)
ANTICOAGULATION CONSULT NOTE - Follow-up Consult  Pharmacy Consult for heparin Indication: chest pain/ACS  Allergies  Allergen Reactions  . Pregabalin Swelling    Tongue swelling  . Sulfonamide Derivatives Swelling    Childhood reaction    Patient Measurements: Height: 5' (152.4 cm) Weight: 167 lb 15.9 oz (76.2 kg) IBW/kg (Calculated) : 45.5 Heparin Dosing Weight: 76kg  Vital Signs: Temp: 98.8 F (37.1 C) (12/20 0432) Temp Source: Oral (12/20 0432) BP: 124/59 (12/20 0739) Pulse Rate: 77 (12/20 0739)  Labs: Recent Labs    01/08/19 2233 01/09/19 0053 01/09/19 0621  HGB 12.1 11.7*  --   HCT 38.8 35.9*  --   PLT 190 180  --   APTT 195*  --   --   HEPARINUNFRC 0.88*  --  0.63  CREATININE 0.83 0.89  --   TROPONINIHS 14,276* 21,213*  --     Estimated Creatinine Clearance: 46.8 mL/min (by C-G formula based on SCr of 0.89 mg/dL).   Medical History: Past Medical History:  Diagnosis Date  . Acute respiratory failure with hypoxia s/p tracheostomy 07/2013  . Anemia    a. 7-08/2013 felt due to recent critical illness/chronic disease.  . Arthritis    handds, knees & back   . Barrett's esophagus   . CAD (coronary artery disease)    a. Mod dz 2010 initially mgd medically. b. 12/2012 - angina s/p PTCA/DES to mid-circumflex, PTCA/DES to first OM.   Marland Kitchen Carotid artery disease ([place])    a. 60-70% bilat ICA stenosis by dopplers 05/2012.  Marland Kitchen Chronic back pain    deteriorating;DDD  . Colonic obstruction due to suspected colitis; s/p colectomy/colostomy    a. See SBO.  Marland Kitchen Complication of anesthesia    hard to wake up from anesthesia   . COPD (chronic obstructive pulmonary disease) ([place])   . Depression    takes Remeron and Zoloft daily  . Dizziness    was on Bentyl which caused this-took off of it and no problems since  . DVT of axillary vein, acute right ([place])    a. Dx 07/2013 felt due to R IJ central line that had been inserted 7/14. Not on anticoag due to GIB issues and small  cerebral bleed.  Marland Kitchen GERD (gastroesophageal reflux disease)   . Heart murmur   . History of bronchitis    > 4yrs ago   . History of colon polyps   . History of hiatal hernia   . History of kidney stones    was told it was stable but doesn't know for sure that she ever passed it  . HTN (hypertension)   . Hypertension    takes Metoprolol daily  . Intracranial hemorrhage ([place])    a. 08/11/13 - per DC summary, tiny SAH vs SDH done for altered mentation, anticoag stopped including aspirin.  . Joint pain   . Neck pain    DDD  . Other and unspecified hyperlipidemia    takes Lipitor daily  . Pneumonia    hx of->15 yrs ago  . Protein-calorie malnutrition, severe w/ electrolyte imbalance    a. Severe hypoalbuminemia leading to 3rd spacing including anasarca and pulm edema 07/2013.  Marland Kitchen SBO (small bowel obstruction) ([place])    a. Lysis of adhesions and ovarian cystectomy 04/2013 for sBO. b. Admitted 07/2013 for colonic obstruction due to suspected colitis, s/p partial colectomy/colostomy 08/01/13. Admission complicated by anasarca, acute resp failure requiring tracheostomy, decannulated 08/25/13. c. Recurrent SBO8/2015, NGT placed for decompression.   . Stroke ([place])  early 80's   right sided weakness--TIA    Assessment: 30 yoF transferred from OSH on IV heparin for CP. PMH includes CAD status post DES x2 in 2014, PAD, hypertension, hyperlipidemia, NSVT, ongoing tobacco abuse, CVA in the setting of a CEA. Pharmacy consulted for heparin dosing - heparin continued from OSH.  Heart cath planned for Monday, 12/21. Heparin level therapeutic at 0.63. Hgb 11.7, Plts 180   Goal of Therapy:  Heparin level 0.3-0.7 units/ml Monitor platelets by anticoagulation protocol: Yes   Plan:  -Continue heparin 1000 units/hr -Check 6hr heparin level -Daily HL and CBC -Monitor for s/sx of bleeding  Lorel Monaco, PharmD PGY1 Ambulatory Care Resident [employer] # [phone]

## 2019-01-10 ENCOUNTER — Encounter (HOSPITAL_COMMUNITY)
Admission: AD | Disposition: A | Discharge status: Home / Self Care | Payer: Self-pay | Source: Other Acute Inpatient Hospital | Attending: Cardiovascular Disease

## 2019-01-10 DIAGNOSIS — I5032 Chronic diastolic (congestive) heart failure: Secondary | ICD-10-CM

## 2019-01-10 HISTORY — PX: LEFT HEART CATH AND CORONARY ANGIOGRAPHY: CATH118249

## 2019-01-10 LAB — CBC
HCT: 33.3 % — ABNORMAL LOW (ref 36.0–46.0)
HCT: 36.3 % (ref 36.0–46.0)
Hemoglobin: 10.6 g/dL — ABNORMAL LOW (ref 12.0–15.0)
Hemoglobin: 11.4 g/dL — ABNORMAL LOW (ref 12.0–15.0)
MCH: 28.4 pg (ref 26.0–34.0)
MCH: 28.2 pg (ref 26.0–34.0)
MCHC: 31.8 g/dL (ref 30.0–36.0)
MCHC: 31.4 g/dL (ref 30.0–36.0)
MCV: 89.3 fL (ref 80.0–100.0)
MCV: 89.9 fL (ref 80.0–100.0)
Platelets: 162 10*3/uL (ref 150–400)
Platelets: 152 10*3/uL (ref 150–400)
RBC: 3.73 MIL/uL — ABNORMAL LOW (ref 3.87–5.11)
RBC: 4.04 MIL/uL (ref 3.87–5.11)
RDW: 13.7 % (ref 11.5–15.5)
RDW: 13.7 % (ref 11.5–15.5)
WBC: 8.2 10*3/uL (ref 4.0–10.5)
WBC: 8.2 10*3/uL (ref 4.0–10.5)
nRBC: 0 % (ref 0.0–0.2)
nRBC: 0 % (ref 0.0–0.2)

## 2019-01-10 LAB — BASIC METABOLIC PANEL
Anion gap: 8 (ref 5–15)
BUN: 13 mg/dL (ref 8–23)
CO2: 22 mmol/L (ref 22–32)
Calcium: 8.4 mg/dL — ABNORMAL LOW (ref 8.9–10.3)
Chloride: 109 mmol/L (ref 98–111)
Creatinine, Ser: 0.83 mg/dL (ref 0.44–1.00)
GFR calc Af Amer: >60 mL/min (ref >60)
GFR calc non Af Amer: >60 mL/min (ref >60)
Glucose, Bld: 119 mg/dL — ABNORMAL HIGH (ref 70–99)
Potassium: 3.7 mmol/L (ref 3.5–5.1)
Sodium: 139 mmol/L (ref 135–145)

## 2019-01-10 LAB — CREATININE, SERUM
Creatinine, Ser: 0.82 mg/dL (ref 0.44–1.00)
GFR calc Af Amer: >60 mL/min (ref >60)
GFR calc non Af Amer: >60 mL/min (ref >60)

## 2019-01-10 LAB — HEPARIN LEVEL (UNFRACTIONATED): Heparin Unfractionated: 0.69 IU/mL (ref 0.30–0.70)

## 2019-01-10 SURGERY — LEFT HEART CATH AND CORONARY ANGIOGRAPHY
Anesthesia: LOCAL

## 2019-01-10 MED ORDER — LIDOCAINE HCL (PF) 1 % IJ SOLN
INTRAMUSCULAR | Status: DC | PRN
  Administered 2019-01-10: 2 mL

## 2019-01-10 MED ORDER — ACETAMINOPHEN 325 MG PO TABS: 650.0000 mg | ORAL_TABLET | ORAL | Status: DC | PRN

## 2019-01-10 MED ORDER — SODIUM CHLORIDE 0.9 % IV SOLN: INTRAVENOUS | Status: DC

## 2019-01-10 MED ORDER — HEPARIN (PORCINE) IN NACL 1000-0.9 UT/500ML-% IV SOLN
INTRAVENOUS | Status: DC | PRN
  Administered 2019-01-10 (×2): 500 mL

## 2019-01-10 MED ORDER — LABETALOL HCL 5 MG/ML IV SOLN: 10.0000 mg | INTRAVENOUS | Status: AC | PRN

## 2019-01-10 MED ORDER — SODIUM CHLORIDE 0.9% FLUSH
3.0000 mL | Freq: Two times a day (BID) | INTRAVENOUS | Status: DC
  Administered 2019-01-10: 3 mL via INTRAVENOUS

## 2019-01-10 MED ORDER — MIDAZOLAM HCL 2 MG/2ML IJ SOLN
INTRAMUSCULAR | Status: DC | PRN
  Administered 2019-01-10: 2 mg via INTRAVENOUS

## 2019-01-10 MED ORDER — DIAZEPAM 5 MG PO TABS: 5.0000 mg | ORAL_TABLET | Freq: Four times a day (QID) | ORAL | Status: DC | PRN

## 2019-01-10 MED ORDER — VERAPAMIL HCL 2.5 MG/ML IV SOLN
INTRAVENOUS | Status: DC | PRN
  Administered 2019-01-10: 10 mL via INTRA_ARTERIAL

## 2019-01-10 MED ORDER — ATORVASTATIN CALCIUM 80 MG PO TABS: 80.0000 mg | ORAL_TABLET | Freq: Every day | ORAL | Status: DC

## 2019-01-10 MED ORDER — LIDOCAINE HCL (PF) 1 % IJ SOLN
INTRAMUSCULAR | Status: AC
  Filled 2019-01-10: qty 30

## 2019-01-10 MED ORDER — SODIUM CHLORIDE 0.9% FLUSH: 3.0000 mL | INTRAVENOUS | Status: DC | PRN

## 2019-01-10 MED ORDER — VERAPAMIL HCL 2.5 MG/ML IV SOLN
INTRAVENOUS | Status: AC
  Filled 2019-01-10: qty 2

## 2019-01-10 MED ORDER — HYDRALAZINE HCL 20 MG/ML IJ SOLN: 10.0000 mg | INTRAMUSCULAR | Status: AC | PRN

## 2019-01-10 MED ORDER — SODIUM CHLORIDE 0.9 % IV SOLN
INTRAVENOUS | Status: AC | PRN
  Administered 2019-01-10: 1 mL/kg/h via INTRAVENOUS

## 2019-01-10 MED ORDER — IOHEXOL 350 MG/ML SOLN
INTRAVENOUS | Status: DC | PRN
  Administered 2019-01-10: 73 mL

## 2019-01-10 MED ORDER — SODIUM CHLORIDE 0.9 % IV SOLN: INTRAVENOUS | Status: AC

## 2019-01-10 MED ORDER — SODIUM CHLORIDE 0.9 % IV SOLN: 250.0000 mL | INTRAVENOUS | Status: DC | PRN

## 2019-01-10 MED ORDER — ASPIRIN 81 MG PO CHEW: 81.0000 mg | CHEWABLE_TABLET | Freq: Every day | ORAL | Status: DC

## 2019-01-10 MED ORDER — HEPARIN SODIUM (PORCINE) 1000 UNIT/ML IJ SOLN
INTRAMUSCULAR | Status: AC
  Filled 2019-01-10: qty 1

## 2019-01-10 MED ORDER — HEPARIN SODIUM (PORCINE) 1000 UNIT/ML IJ SOLN
INTRAMUSCULAR | Status: DC | PRN
  Administered 2019-01-10: 3500 [IU] via INTRAVENOUS

## 2019-01-10 MED ORDER — MIDAZOLAM HCL 2 MG/2ML IJ SOLN
INTRAMUSCULAR | Status: AC
  Filled 2019-01-10: qty 2

## 2019-01-10 MED ORDER — ONDANSETRON HCL 4 MG/2ML IJ SOLN: 4.0000 mg | Freq: Four times a day (QID) | INTRAMUSCULAR | Status: DC | PRN

## 2019-01-10 MED ORDER — HEPARIN (PORCINE) IN NACL 1000-0.9 UT/500ML-% IV SOLN
INTRAVENOUS | Status: AC
  Filled 2019-01-10: qty 1000

## 2019-01-10 MED ORDER — FENTANYL CITRATE (PF) 100 MCG/2ML IJ SOLN
INTRAMUSCULAR | Status: AC
  Filled 2019-01-10: qty 2

## 2019-01-10 MED ORDER — FENTANYL CITRATE (PF) 100 MCG/2ML IJ SOLN
INTRAMUSCULAR | Status: DC | PRN
  Administered 2019-01-10: 25 ug via INTRAVENOUS

## 2019-01-10 MED ORDER — HEPARIN SODIUM (PORCINE) 5000 UNIT/ML IJ SOLN
5000.0000 [IU] | Freq: Three times a day (TID) | INTRAMUSCULAR | Status: DC
  Administered 2019-01-10 – 2019-01-11 (×2): 5000 [IU] via SUBCUTANEOUS
  Filled 2019-01-10 (×2): qty 1

## 2019-01-10 SURGICAL SUPPLY — 11 items
CATH INFINITI JR4 5F (CATHETERS) ×1 IMPLANT
CATH OPTITORQUE TIG 4.0 5F (CATHETERS) ×1 IMPLANT
DEVICE RAD COMP TR BAND LRG (VASCULAR PRODUCTS) ×1 IMPLANT
GLIDESHEATH SLEND SS 6F .021 (SHEATH) ×1 IMPLANT
GUIDEWIRE INQWIRE 1.5J.035X260 (WIRE) IMPLANT
INQWIRE 1.5J .035X260CM (WIRE) ×2
KIT HEART LEFT (KITS) ×2 IMPLANT
PACK CARDIAC CATHETERIZATION (CUSTOM PROCEDURE TRAY) ×2 IMPLANT
SHEATH PROBE COVER 6X72 (BAG) ×1 IMPLANT
TRANSDUCER W/STOPCOCK (MISCELLANEOUS) ×2 IMPLANT
TUBING CIL FLEX 10 FLL-RA (TUBING) ×2 IMPLANT

## 2019-01-10 NOTE — Progress Notes (Signed)
Progress Note  Patient Name: [name] Date of Encounter: 01/10/2019  Primary Cardiologist: Ena Dawley, MD   Subjective   Plan for cardiac catheterization today. No further chest pain. Patient is on 2L O2 (not on O2 at baseline) but able to lay flat.   Inpatient Medications    Scheduled Meds: . aspirin EC  81 mg Oral Daily  . atorvastatin  80 mg Oral q1800  . folic acid  1 mg Oral Daily  . metoprolol tartrate  25 mg Oral BID  . mirtazapine  45 mg Oral QHS  . nicotine  7 mg Transdermal Daily  . omega-3 acid ethyl esters  1 g Oral Daily  . pantoprazole  40 mg Oral Daily  . sertraline  100 mg Oral BID   Continuous Infusions: . sodium chloride 1 mL/kg/hr (01/10/19 0546)  . heparin 1,000 Units/hr (01/10/19 0600)  . nitroGLYCERIN 30 mcg/min (01/10/19 0600)   PRN Meds: acetaminophen, nitroGLYCERIN, ondansetron (ZOFRAN) IV, sodium chloride flush   Vital Signs    Vitals:   01/10/19 0104 01/10/19 0304 01/10/19 0323 01/10/19 0440  BP: 100/74 (!) 93/55 (!) 108/45 (!) 113/56  Pulse: 72 69 68 72  Resp:   18 18  Temp:    98.6 F (37 C)  TempSrc:    Oral  SpO2: 97% 99% 100% 99%  Weight:    77 kg  Height:        Intake/Output Summary (Last 24 hours) at 01/10/2019 0733 Last data filed at 01/10/2019 0600 Gross per 24 hour  Intake 642.9 ml  Output --  Net 642.9 ml   Last 3 Weights 01/10/2019 01/09/2019 01/08/2019  Weight (lbs) 169 lb 12.1 oz 167 lb 15.9 oz 167 lb 11.2 oz  Weight (kg) 77 kg 76.2 kg 76.068 kg      Telemetry    NSR, HR 60-70s; no other arrhythmias noted - Personally Reviewed  ECG    No new - Personally Reviewed  Physical Exam   GEN: No acute distress.   Neck: No JVD Cardiac: RRR, no murmurs, rubs, or gallops.  Respiratory: Diffuse rhonchii. GI: Soft, nontender, non-distended  MS: No edema; No deformity. Neuro:  Nonfocal  Psych: Normal affect   Labs    High Sensitivity Troponin:   Recent Labs  Lab 01/08/19 2233  01/09/19 0053  TROPONINIHS 14,276* 21,213*      Chemistry Recent Labs  Lab 01/08/19 2233 01/09/19 0053  NA 141 139  K 4.0 3.9  CL 110 109  CO2 22 23  GLUCOSE 120* 162*  BUN 13 13  CREATININE 0.83 0.89  CALCIUM 8.7* 8.4*  PROT 6.2*  --   ALBUMIN 3.6  --   AST 327*  --   ALT 133*  --   ALKPHOS 177*  --   BILITOT 0.6  --   GFRNONAA >60 >60  GFRAA >60 >60  ANIONGAP 9 7     Hematology Recent Labs  Lab 01/08/19 2233 01/09/19 0053 01/10/19 0651  WBC 9.2 8.1 8.2  RBC 4.31 4.05 3.73*  HGB 12.1 11.7* 10.6*  HCT 38.8 35.9* 33.3*  MCV 90.0 88.6 89.3  MCH 28.1 28.9 28.4  MCHC 31.2 32.6 31.8  RDW 13.6 13.6 13.7  PLT 190 180 162    BNP Recent Labs  Lab 01/08/19 2233  BNP 186.4*     DDimer No results for input(s): DDIMER in the last 168 hours.   Radiology    Portable chest x-ray 1 view  Result Date: 01/09/2019  CLINICAL DATA:  Central and left-sided chest pain. EXAM: PORTABLE CHEST 1 VIEW COMPARISON:  Radiograph and CT earlier this day at [place]: Mild cardiomegaly. Coronary stents visualized. Blunting of the left costophrenic angle likely combination of epicardial fat pad and small pleural effusion is seen on CT. No pulmonary edema. No new airspace disease. No pneumothorax. IMPRESSION: 1. Mild cardiomegaly. 2. Blunting of the left costophrenic angle likely combination of epicardial fat pad and small pleural effusion seen on CT. Electronically Signed   By: Keith Rake M.D.   On: 01/09/2019 00:02   ECHOCARDIOGRAM COMPLETE  Result Date: 01/09/2019   ECHOCARDIOGRAM REPORT   Patient Name:   [name] [facility] Date of Exam: 01/09/2019 Medical Rec #:  [id number]           Height:       60.0 in Accession #:    [id number]          Weight:       168.0 lb Date of Birth:  27-Nov-1940           BSA:          1.73 m Patient Age:    40 years            BP:           124/59 mmHg Patient Gender: F                   HR:           77 bpm. Exam Location:  Inpatient  Procedure: 2D Echo, Cardiac Doppler and Color Doppler Indications:    Chest pain  History:        Patient has prior history of Echocardiogram examinations, most                 recent 11/05/2018. CAD and Acute MI, COPD, PAD and Stroke,                 Mitral Valve Disease, Arrythmias:NSVT; Risk Factors:Current                 Smoker and Hypertension.  Sonographer:    Dustin Flock Referring Phys: [id number] [place]  1. Left ventricular ejection fraction, by visual estimation, is 60 to 65%. The left ventricle has normal function. There is borderline left ventricular hypertrophy.  2. Left ventricular diastolic parameters are consistent with Grade II diastolic dysfunction (pseudonormalization).  3. The left ventricle has no regional wall motion abnormalities.  4. Global right ventricle was not well visualized.The right ventricular size is normal. No increase in right ventricular wall thickness.  5. Left atrial size was mild-moderately dilated.  6. Right atrial size was normal.  7. Presence of pericardial fat pad.  8. Small pericardial effusion.  9. The mitral valve is rheumatic. Trivial mitral valve regurgitation. Mild mitral stenosis. 10. The tricuspid valve is normal in structure. Tricuspid valve regurgitation is trivial. 11. The aortic valve is tricuspid. Aortic valve regurgitation is not visualized. No evidence of aortic valve sclerosis or stenosis. 12. Pulmonic regurgitation is mild. 13. The pulmonic valve was grossly normal. Pulmonic valve regurgitation is mild. FINDINGS  Left Ventricle: Left ventricular ejection fraction, by visual estimation, is 60 to 65%. The left ventricle has normal function. The left ventricle has no regional wall motion abnormalities. There is borderline left ventricular hypertrophy. Concentric left ventricular hypertrophy. Left ventricular diastolic parameters are consistent with Grade II diastolic dysfunction (pseudonormalization). Right Ventricle: The right  ventricular size is  normal. No increase in right ventricular wall thickness. Global RV systolic function is was not well visualized. Left Atrium: Left atrial size was mild-moderately dilated. Right Atrium: Right atrial size was normal in size Pericardium: A small pericardial effusion is present. Presence of pericardial fat pad. Mitral Valve: The mitral valve is rheumatic. Trivial mitral valve regurgitation. Mild mitral valve stenosis by observation. MV peak gradient, 21.3 mmHg. Tricuspid Valve: The tricuspid valve is normal in structure. Tricuspid valve regurgitation is trivial. Aortic Valve: The aortic valve is tricuspid. Aortic valve regurgitation is not visualized. The aortic valve is structurally normal, with no evidence of sclerosis or stenosis. Pulmonic Valve: The pulmonic valve was grossly normal. Pulmonic valve regurgitation is mild. Pulmonic regurgitation is mild. Aorta: The aortic root, ascending aorta and aortic arch are all structurally normal, with no evidence of dilitation or obstruction. IAS/Shunts: No atrial level shunt detected by color flow Doppler.  LEFT VENTRICLE PLAX 2D LVIDd:         3.50 cm  Diastology LVIDs:         2.50 cm  LV e' lateral:   5.55 cm/s LV PW:         1.30 cm  LV E/e' lateral: 32.3 LV IVS:        1.30 cm  LV e' medial:    4.46 cm/s LVOT diam:     2.20 cm  LV E/e' medial:  40.1 LV SV:         29 ml LV SV Index:   15.60 LVOT Area:     3.80 cm  RIGHT VENTRICLE RV Basal diam:  2.40 cm RV S prime:     6.09 cm/s LEFT ATRIUM           Index       RIGHT ATRIUM           Index LA diam:      4.20 cm 2.42 cm/m  RA Area:     14.00 cm LA Vol (A2C): 44.3 ml 25.56 ml/m RA Volume:   34.00 ml  19.62 ml/m LA Vol (A4C): 48.1 ml 27.75 ml/m  AORTIC VALVE LVOT Vmax:   70.90 cm/s LVOT Vmean:  48.600 cm/s LVOT VTI:    0.167 m  AORTA Ao Root diam: 2.90 cm MITRAL VALVE MV Area (PHT): 3.85 cm              SHUNTS MV Peak grad:  21.3 mmHg             Systemic VTI:  0.17 m MV Mean grad:  9.0 mmHg               Systemic Diam: 2.20 cm MV Vmax:       2.31 m/s MV Vmean:      138.0 cm/s MV VTI:        0.58 m MV PHT:        57.13 msec MV Decel Time: 197 msec MV E velocity: 179.00 cm/s 103 cm/s MV A velocity: 149.00 cm/s 70.3 cm/s MV E/A ratio:  1.20        1.5  Buford Dresser MD Electronically signed by Buford Dresser MD Signature Date/Time: 01/09/2019/3:50:24 PM    Final     Cardiac Studies   Echo 11/09/18  1. Left ventricular ejection fraction, by visual estimation, is 60 to 65%. The left ventricle has normal function. There is borderline left ventricular hypertrophy.  2. Left ventricular diastolic parameters are consistent with Grade II diastolic dysfunction (pseudonormalization).  3.  The left ventricle has no regional wall motion abnormalities.  4. Global right ventricle was not well visualized.The right ventricular size is normal. No increase in right ventricular wall thickness.  5. Left atrial size was mild-moderately dilated.  6. Right atrial size was normal.  7. Presence of pericardial fat pad.  8. Small pericardial effusion.  9. The mitral valve is rheumatic. Trivial mitral valve regurgitation. Mild mitral stenosis. 10. The tricuspid valve is normal in structure. Tricuspid valve regurgitation is trivial. 11. The aortic valve is tricuspid. Aortic valve regurgitation is not visualized. No evidence of aortic valve sclerosis or stenosis. 12. Pulmonic regurgitation is mild. 13. The pulmonic valve was grossly normal. Pulmonic valve regurgitation is mild.  Patient Profile     78 y.o. female is a 78 year old with CAD s/p DES x 2 in 2014, PAD, hypertension, hyperlipidemia, NSVT, tobacco abuse, CVA in the setting of CEA who presented with severe CP found to have NSTEMI.   Assessment & Plan    NSTEMI/CAD Patient presented to [facility] for recurrent chest pain. HS troponin peal to 21,213. EKG with no acute changes. She was started on IV NTG and IN heparin and transferred to  [facility]. - Since arriving has been chest pain free - on IV heparin and IV NTG  - Echo showed EF 60-65%, borderline LV hypertrophy, G2DD, no WMA, rheumatic mitral valve, small pericardial effusion - creatinine stable - Plan for cardiac cath today  Risks and benefits of cardiac catheterization have been discussed with the patient.  These include bleeding, infection, kidney damage, stroke, heart attack, death.  The patient understands these risks and is willing to proceed. - further recommendations per cath - continue Aspirin  PVD s/p carotid enarterectomy - complicated by post-op stroke resulting in mild left hemiplegia  Tobacco abuse - recommend cessation  HLD - continue atorvastatin 80 mg daily - LDL 58  HTN - controlled on metoprolol 25 mg BID and IV NTG    For questions or updates, please contact [facility] Please consult [email or web address] for contact info under        Signed, Jeremyah Jelley Ninfa Meeker, PA-C  01/10/2019, 7:33 AM

## 2019-01-10 NOTE — Progress Notes (Signed)
[id number] Gave pt MI booklet and left heart healthy diet. Discussed smoking cessation. Pt stated she has cut down to less than one half pack and plans to continue working on it. She stated her MD knows her plan.. Told her to consider making a definite stop date. Reviewed NTG use, MI restrictions and discussed CRP 2. Will send referral letter to [facility]. Will follow up tomorrow. Graylon Good RN BSN 01/10/2019 1:49 PM

## 2019-01-10 NOTE — Interval H&P Note (Signed)
Cath Lab Visit (complete for each Cath Lab visit)  Clinical Evaluation Leading to the Procedure:   ACS: Yes.    Non-ACS:    Anginal Classification: CCS IV  Anti-ischemic medical therapy: Maximal Therapy (2 or more classes of medications)  Non-Invasive Test Results: No non-invasive testing performed  Prior CABG: No previous CABG      History and Physical Interval Note:  01/10/2019 9:50 AM  [name]  has presented today for surgery, with the diagnosis of NSTEMI.  The various methods of treatment have been discussed with the patient and family. After consideration of risks, benefits and other options for treatment, the patient has consented to  Procedure(s): LEFT HEART CATH AND CORONARY ANGIOGRAPHY (N/A) as a surgical intervention.  The patient's history has been reviewed, patient examined, no change in status, stable for surgery.  I have reviewed the patient's chart and labs.  Questions were answered to the patient's satisfaction.     Shelva Majestic

## 2019-01-11 ENCOUNTER — Telehealth: Payer: Self-pay

## 2019-01-11 DIAGNOSIS — E118 Type 2 diabetes mellitus with unspecified complications: Secondary | ICD-10-CM

## 2019-01-11 DIAGNOSIS — I2583 Coronary atherosclerosis due to lipid rich plaque: Secondary | ICD-10-CM

## 2019-01-11 LAB — BASIC METABOLIC PANEL
Anion gap: 10 (ref 5–15)
BUN: 8 mg/dL (ref 8–23)
CO2: 22 mmol/L (ref 22–32)
Calcium: 8.6 mg/dL — ABNORMAL LOW (ref 8.9–10.3)
Chloride: 110 mmol/L (ref 98–111)
Creatinine, Ser: 0.76 mg/dL (ref 0.44–1.00)
GFR calc Af Amer: >60 mL/min (ref >60)
GFR calc non Af Amer: >60 mL/min (ref >60)
Glucose, Bld: 102 mg/dL — ABNORMAL HIGH (ref 70–99)
Potassium: 3.7 mmol/L (ref 3.5–5.1)
Sodium: 142 mmol/L (ref 135–145)

## 2019-01-11 LAB — CBC
HCT: 34.7 % — ABNORMAL LOW (ref 36.0–46.0)
Hemoglobin: 11.2 g/dL — ABNORMAL LOW (ref 12.0–15.0)
MCH: 28.6 pg (ref 26.0–34.0)
MCHC: 32.3 g/dL (ref 30.0–36.0)
MCV: 88.5 fL (ref 80.0–100.0)
Platelets: 155 10*3/uL (ref 150–400)
RBC: 3.92 MIL/uL (ref 3.87–5.11)
RDW: 13.8 % (ref 11.5–15.5)
WBC: 6.7 10*3/uL (ref 4.0–10.5)
nRBC: 0 % (ref 0.0–0.2)

## 2019-01-11 MED ORDER — NICOTINE 7 MG/24HR TD PT24: 7.0000 mg | MEDICATED_PATCH | Freq: Every day | TRANSDERMAL | 3 refills | Status: DC

## 2019-01-11 NOTE — Discharge Summary (Signed)
Discharge Summary    Patient ID: [name] MRN: [id number]; DOB: 1940/10/01  Admit date: 01/08/2019 Discharge date: 01/11/2019  Primary Care Provider: [name], MD  Primary Cardiologist: Ena Dawley, MD  Primary Electrophysiologist:  Will Meredith Leeds, MD   Discharge Diagnoses    Principal Problem:   NSTEMI (non-ST elevated myocardial infarction) [facility]) Active Problems:   Hyperlipidemia, mixed   TOBACCO ABUSE   Chest pain   CAD (coronary artery disease)   HTN (hypertension)   Chronic diastolic CHF (congestive heart failure), NYHA class 2 ([place])   Cerebrovascular accident (CVA) due to embolism of right middle cerebral artery ([place])   Type 2 diabetes mellitus with complication, without long-term current use of insulin ([place])   Diagnostic Studies/Procedures    Echo 11/09/18 1. Left ventricular ejection fraction, by visual estimation, is 60 to 65%. The left ventricle has normal function. There is borderline left ventricular hypertrophy. 2. Left ventricular diastolic parameters are consistent with Grade II diastolic dysfunction (pseudonormalization). 3. The left ventricle has no regional wall motion abnormalities. 4. Global right ventricle was not well visualized.The right ventricular size is normal. No increase in right ventricular wall thickness. 5. Left atrial size was mild-moderately dilated. 6. Right atrial size was normal. 7. Presence of pericardial fat pad. 8. Small pericardial effusion. 9. The mitral valve is rheumatic. Trivial mitral valve regurgitation. Mild mitral stenosis. 10. The tricuspid valve is normal in structure. Tricuspid valve regurgitation is trivial. 11. The aortic valve is tricuspid. Aortic valve regurgitation is not visualized. No evidence of aortic valve sclerosis or stenosis. 12. Pulmonic regurgitation is mild. 13. The pulmonic valve was grossly normal. Pulmonic valve regurgitation is mild.  Cardiac cath  01/10/19  Previously placed Mid Cx to Dist Cx stent (unknown type) is widely patent.  Mid Cx lesion is 30% stenosed.  2nd Mrg-1 lesion is 40% stenosed.  Previously placed 2nd Mrg-2 stent (unknown type) is widely patent.  2nd Mrg-3 lesion is 100% stenosed.  Prox RCA lesion is 30% stenosed.  The left ventricular systolic function is normal.  LV end diastolic pressure is mildly elevated.  Non-ST segment elevation myocardial infarction secondary to distal total occlusion of the obtuse marginal branch of the left circumflex coronary artery,  Normal left main, LAD and mild nonobstructive 30% narrowing in the RCA which supplies the posterior wall.  The left circumflex vessel has 40% ostial stenosis in the OM 2 vessel with a widely patent stent in the midportion of this marginal branch. The distal marginal is 100% occluded. There is faint collateralization from the LAD to the distal vessel which is small caliber; the AV groove circumflex has 30% narrowing in the stent in the AV groove circumflex is widely patent.  Serve global LV function with EF estimated 55%. There is a subtle focal area of distal inferior hypocontractility.  RECOMMENDATION: The patient's ECG is entirely normal. She is not having any recurrent chest pain. Her MI was over 48 hours ago. With her stability and distal occlusion recommend medical management. Consider DAPT; however, will need to obtain additional information with her history of prior stroke following carotid endarterectomy remotely. Alternatively aspirin alone and medical therapy for her current obstructive disease. Smoking cessation is essential. Aggressive lipid-lowering therapy with target LDL less than 70.  inferoapical hypocontractility. LVEDP is 21 mm.  Coronary Diagrams  Diagnostic Dominance: Co-dominant       _____________   History of Present Illness     [name] is a 78 y.o. female with  CAD s/p DES x 2 in  2014, PAD, HTN, HLD, NSVT, tobacco use, CVA in the setting of CEA who presented to the ED 01/08/19 for chest pain. That morning chest pain started at 11 AM while getting dressed. Pain was a sudden severe crushing 10/10 central chest pressure which radiated to her bilateral shoulders. She had associated diaphoresis. Denied SOB or nausea. She took NTG x 2 with minimal relief. She called EMS and took her to [facility]     Consultants: None  In [facility] she was hemodynamically stable. She was given NTG x 1 and started on morphine but continued to have chest pain 4/10. Labs showed troponin I increased from 0.03 - 1.24 and she was started on IV heparin. She was transferred to [facility] for possible cardiac catheterization. When patient arrived to [facility] cone HS troponin came back at 14,000. She was started on a NTG drip and chest pain decreased to 1/10. Aspirin was continued. Echo showed EF 60-65%, borderline LVH, G2DD, no regional wall motion abnormalities, moderately dilated LA, rheumatic mitral valve, mild MS. LDL came back at 58. Atorvastatin 80 mg was continued. A1C came back at 6.5 giving patient a new diabetes 2 diagnosis. Metoprolol 25 mg BID was initiated (at baseline was on Toprol 50 mg daily). Nicotine patch was given for encouragement of smoking cessation. Second troponin came back at 21,213 but patient remained chest pain free on IV heparin and IV NTG. Patient remained stable over the weekend. She was taken down to the cath lab for a left heart catheterization. Heart cath showed normal left main, LAD and mild nonobstructive 30% narrowing in the RCA, left Cx with 40% ostial stenosis in the OM 2 with a widely patent stent, distal marginal is 100% occluded; AV groove Cx 30% narrowing. OM branch of left circumflex was too small for PCI and had faint collaterals from LAD. No PCI was performed. Recommended medical management with consideration of DAPT for residual disease.  The patient remained stable throughout the procedure. Hgb 11.2 and creatinine 0.76 the next day. Cath site, right radial, remained clean and dry with no signs of hematoma or bruit. She worked with cardiac rehab and was asymptomatic with ambulation. Spoke to the patient about the importance of diet, activity, and tobacco cessation. DAPT was considered but ultimately deferred to her primary cardiologist (Dr. Meda Coffee). Plan to discharge patient on Aspirin daily, Atorvastatin 80 mg daily, and Toprol XL 50 mg daily. Recommended patient follow-up with Primary care for further diabetes management.   Patient was seen and examined by Dr. Radford Pax on 01/11/19 and felt to be stable for discharge. Follow-up appointment was arranged.   Did the patient have an acute coronary syndrome (MI, NSTEMI, STEMI, etc) this admission?:  Yes                               AHA/ACC Clinical Performance & Quality Measures: 1. Aspirin prescribed? - Yes 2. ADP Receptor Inhibitor (Plavix/Clopidogrel, Brilinta/Ticagrelor or Effient/Prasugrel) prescribed (includes medically managed patients)? - No -  plavix deferred to primary cardiologist 3. Beta Blocker prescribed? - Yes 4. High Intensity Statin (Lipitor 40-80mg  or Crestor 20-40mg ) prescribed? - Yes 5. EF assessed during THIS hospitalization? - Yes 6. For EF <40%, was ACEI/ARB prescribed? - Not Applicable (EF >/= 69%) 7. For EF <40%, Aldosterone Antagonist (Spironolactone or Eplerenone) prescribed? - Not Applicable (EF >/= 67%) 8. Cardiac Rehab Phase II ordered (  Included Medically managed Patients)? - Yes   _____________  Discharge Vitals Blood pressure (!) 127/59, pulse 74, temperature 98.4 F (36.9 C), temperature source Oral, resp. rate 18, height 5' (1.524 m), weight 76.7 kg, SpO2 98 %.  Filed Weights   01/09/19 0432 01/10/19 0440 01/11/19 0320  Weight: 76.2 kg 77 kg 76.7 kg    Labs & Radiologic Studies    CBC Recent Labs    01/08/19 2233 01/10/19 1142  01/11/19 0438  WBC 9.2 8.2 6.7  NEUTROABS 6.2  --   --   HGB 12.1 11.4* 11.2*  HCT 38.8 36.3 34.7*  MCV 90.0 89.9 88.5  PLT 190 152 818   Basic Metabolic Panel Recent Labs    01/08/19 2233 01/10/19 0809 01/10/19 1142 01/11/19 0438  NA 141 139  --  142  K 4.0 3.7  --  3.7  CL 110 109  --  110  CO2 22 22  --  22  GLUCOSE 120* 119*  --  102*  BUN 13 13  --  8  CREATININE 0.83 0.83 0.82 0.76  CALCIUM 8.7* 8.4*  --  8.6*  MG 1.8  --   --   --    Liver Function Tests Recent Labs    01/08/19 2233  AST 327*  ALT 133*  ALKPHOS 177*  BILITOT 0.6  PROT 6.2*  ALBUMIN 3.6   No results for input(s): LIPASE, AMYLASE in the last 72 hours. High Sensitivity Troponin:   Recent Labs  Lab 01/08/19 2233 01/09/19 0053  TROPONINIHS 14,276* 21,213*    BNP Invalid input(s): POCBNP D-Dimer No results for input(s): DDIMER in the last 72 hours. Hemoglobin A1C Recent Labs    01/08/19 2233  HGBA1C 6.5*   Fasting Lipid Panel Recent Labs    01/09/19 0053  CHOL 117  HDL 42  LDLCALC 58  TRIG 86  CHOLHDL 2.8   Thyroid Function Tests Recent Labs    01/08/19 2233  TSH 4.964*   _____________  CARDIAC CATHETERIZATION  Result Date: 01/10/2019  Previously placed Mid Cx to Dist Cx stent (unknown type) is widely patent.  Mid Cx lesion is 30% stenosed.  2nd Mrg-1 lesion is 40% stenosed.  Previously placed 2nd Mrg-2 stent (unknown type) is widely patent.  2nd Mrg-3 lesion is 100% stenosed.  Prox RCA lesion is 30% stenosed.  The left ventricular systolic function is normal.  LV end diastolic pressure is mildly elevated.  Non-ST segment elevation myocardial infarction secondary to distal total occlusion of the obtuse marginal branch of the left circumflex coronary artery, Normal left main, LAD and mild nonobstructive 30% narrowing in the RCA which supplies the posterior wall. The left circumflex vessel has 40% ostial stenosis in the OM 2 vessel with a widely patent stent in the  midportion of this marginal branch.  The distal marginal is 100% occluded.  There is faint collateralization from the LAD to the distal vessel which is small caliber; the AV groove circumflex has 30% narrowing in the stent in the AV groove circumflex is widely patent. Serve global LV function with EF estimated 55%.  There is a subtle focal area of distal inferior hypocontractility. RECOMMENDATION: The patient's ECG is entirely normal.  She is not having any recurrent chest pain.  Her MI was over 48 hours ago.  With her stability and distal occlusion recommend medical management.  Consider DAPT; however, will need to obtain additional information with her history of prior stroke following carotid endarterectomy remotely.  Alternatively aspirin  alone and medical therapy for her current obstructive disease.  Smoking cessation is essential.  Aggressive lipid-lowering therapy with target LDL less than 70.   Portable chest x-ray 1 view  Result Date: 01/09/2019 CLINICAL DATA:  Central and left-sided chest pain. EXAM: PORTABLE CHEST 1 VIEW COMPARISON:  Radiograph and CT earlier this day at [place]: Mild cardiomegaly. Coronary stents visualized. Blunting of the left costophrenic angle likely combination of epicardial fat pad and small pleural effusion is seen on CT. No pulmonary edema. No new airspace disease. No pneumothorax. IMPRESSION: 1. Mild cardiomegaly. 2. Blunting of the left costophrenic angle likely combination of epicardial fat pad and small pleural effusion seen on CT. Electronically Signed   By: Keith Rake M.D.   On: 01/09/2019 00:02   ECHOCARDIOGRAM COMPLETE  Result Date: 01/09/2019   ECHOCARDIOGRAM REPORT   Patient Name:   [name] [place] Date of Exam: 01/09/2019 Medical Rec #:  [id number]           Height:       60.0 in Accession #:    [id number]          Weight:       168.0 lb Date of Birth:  Mar 12, 1940           BSA:          1.73 m Patient Age:    66 years             BP:           124/59 mmHg Patient Gender: F                   HR:           77 bpm. Exam Location:  Inpatient Procedure: 2D Echo, Cardiac Doppler and Color Doppler Indications:    Chest pain  History:        Patient has prior history of Echocardiogram examinations, most                 recent 11/05/2018. CAD and Acute MI, COPD, PAD and Stroke,                 Mitral Valve Disease, Arrythmias:NSVT; Risk Factors:Current                 Smoker and Hypertension.  Sonographer:    Dustin Flock Referring Phys: [id number] [place]  1. Left ventricular ejection fraction, by visual estimation, is 60 to 65%. The left ventricle has normal function. There is borderline left ventricular hypertrophy.  2. Left ventricular diastolic parameters are consistent with Grade II diastolic dysfunction (pseudonormalization).  3. The left ventricle has no regional wall motion abnormalities.  4. Global right ventricle was not well visualized.The right ventricular size is normal. No increase in right ventricular wall thickness.  5. Left atrial size was mild-moderately dilated.  6. Right atrial size was normal.  7. Presence of pericardial fat pad.  8. Small pericardial effusion.  9. The mitral valve is rheumatic. Trivial mitral valve regurgitation. Mild mitral stenosis. 10. The tricuspid valve is normal in structure. Tricuspid valve regurgitation is trivial. 11. The aortic valve is tricuspid. Aortic valve regurgitation is not visualized. No evidence of aortic valve sclerosis or stenosis. 12. Pulmonic regurgitation is mild. 13. The pulmonic valve was grossly normal. Pulmonic valve regurgitation is mild. FINDINGS  Left Ventricle: Left ventricular ejection fraction, by visual estimation, is 60 to 65%. The left ventricle has normal function. The left ventricle has  no regional wall motion abnormalities. There is borderline left ventricular hypertrophy. Concentric left ventricular hypertrophy. Left ventricular diastolic  parameters are consistent with Grade II diastolic dysfunction (pseudonormalization). Right Ventricle: The right ventricular size is normal. No increase in right ventricular wall thickness. Global RV systolic function is was not well visualized. Left Atrium: Left atrial size was mild-moderately dilated. Right Atrium: Right atrial size was normal in size Pericardium: A small pericardial effusion is present. Presence of pericardial fat pad. Mitral Valve: The mitral valve is rheumatic. Trivial mitral valve regurgitation. Mild mitral valve stenosis by observation. MV peak gradient, 21.3 mmHg. Tricuspid Valve: The tricuspid valve is normal in structure. Tricuspid valve regurgitation is trivial. Aortic Valve: The aortic valve is tricuspid. Aortic valve regurgitation is not visualized. The aortic valve is structurally normal, with no evidence of sclerosis or stenosis. Pulmonic Valve: The pulmonic valve was grossly normal. Pulmonic valve regurgitation is mild. Pulmonic regurgitation is mild. Aorta: The aortic root, ascending aorta and aortic arch are all structurally normal, with no evidence of dilitation or obstruction. IAS/Shunts: No atrial level shunt detected by color flow Doppler.  LEFT VENTRICLE PLAX 2D LVIDd:         3.50 cm  Diastology LVIDs:         2.50 cm  LV e' lateral:   5.55 cm/s LV PW:         1.30 cm  LV E/e' lateral: 32.3 LV IVS:        1.30 cm  LV e' medial:    4.46 cm/s LVOT diam:     2.20 cm  LV E/e' medial:  40.1 LV SV:         29 ml LV SV Index:   15.60 LVOT Area:     3.80 cm  RIGHT VENTRICLE RV Basal diam:  2.40 cm RV S prime:     6.09 cm/s LEFT ATRIUM           Index       RIGHT ATRIUM           Index LA diam:      4.20 cm 2.42 cm/m  RA Area:     14.00 cm LA Vol (A2C): 44.3 ml 25.56 ml/m RA Volume:   34.00 ml  19.62 ml/m LA Vol (A4C): 48.1 ml 27.75 ml/m  AORTIC VALVE LVOT Vmax:   70.90 cm/s LVOT Vmean:  48.600 cm/s LVOT VTI:    0.167 m  AORTA Ao Root diam: 2.90 cm MITRAL VALVE MV Area (PHT):  3.85 cm              SHUNTS MV Peak grad:  21.3 mmHg             Systemic VTI:  0.17 m MV Mean grad:  9.0 mmHg              Systemic Diam: 2.20 cm MV Vmax:       2.31 m/s MV Vmean:      138.0 cm/s MV VTI:        0.58 m MV PHT:        57.13 msec MV Decel Time: 197 msec MV E velocity: 179.00 cm/s 103 cm/s MV A velocity: 149.00 cm/s 70.3 cm/s MV E/A ratio:  1.20        1.5  Buford Dresser MD Electronically signed by Buford Dresser MD Signature Date/Time: 01/09/2019/3:50:24 PM    Final    Disposition   Pt is being discharged home today in good condition.  Follow-up Plans & Appointments    Follow-up Information    Burtis Junes, NP Follow up on 01/25/2019.   Specialties: Nurse Practitioner, Interventional Cardiology, Cardiology, Radiology Why: Please go to hospital follow-up January 5th at 10:00 AM Contact information: [place]. [address] [place] [address] [phone]          Discharge Instructions    Amb Referral to Cardiac Rehabilitation   Complete by: As directed    Referring to [place] CRP 2   Diagnosis: NSTEMI   After initial evaluation and assessments completed: Virtual Based Care may be provided alone or in conjunction with Phase 2 Cardiac Rehab based on patient barriers.: Yes      Discharge Medications   Allergies as of 01/11/2019      Reactions   Pregabalin Swelling   Tongue swelling   Sulfonamide Derivatives Swelling   Childhood reaction      Medication List    TAKE these medications   aspirin EC 81 MG tablet Take 162 mg by mouth daily.   atorvastatin 80 MG tablet Commonly known as: LIPITOR Take 80 mg by mouth every evening.   buPROPion 150 MG 24 hr tablet Commonly known as: WELLBUTRIN XL Take 1 tablet (150 mg total) by mouth daily.   Clobetasol Prop Emollient Base 0.05 % emollient cream Apply 1 application topically daily as needed (Skin irritation.).   docusate sodium 100 MG capsule Commonly known as: COLACE Take 100  mg by mouth as needed for mild constipation.   fish oil-omega-3 fatty acids 1000 MG capsule Take 1,000 mg by mouth daily.   fluticasone 50 MCG/ACT nasal spray Commonly known as: FLONASE Place 1 spray into both nostrils daily as needed for allergies.   folic acid 601 MCG tablet Commonly known as: FOLVITE Take 800 mcg by mouth daily.   metoprolol succinate 50 MG 24 hr tablet Commonly known as: TOPROL-XL Take 1 tablet (50 mg total) by mouth daily. Take with or immediately following a meal.   mirtazapine 45 MG tablet Commonly known as: REMERON Take 45 mg by mouth at bedtime.   nicotine 7 mg/24hr patch Commonly known as: NICODERM CQ - dosed in mg/24 hr Place 1 patch (7 mg total) onto the skin daily. Start taking on: January 12, 2019   nitroGLYCERIN 0.4 MG SL tablet Commonly known as: NITROSTAT Place 1 tablet (0.4 mg total) under the tongue every 5 (five) minutes as needed for chest pain.   ondansetron 4 MG tablet Commonly known as: ZOFRAN Take 4 mg by mouth every 8 (eight) hours as needed for nausea or vomiting.   pantoprazole 40 MG tablet Commonly known as: PROTONIX Take 40 mg by mouth daily.   sertraline 100 MG tablet Commonly known as: ZOLOFT Take 100 mg by mouth 2 (two) times daily.          Outstanding Labs/Studies   None  Duration of Discharge Encounter   Greater than 30 minutes including physician time.  Signed, Kristie Bracewell Ninfa Meeker, PA-C 01/11/2019, 10:30 AM

## 2019-01-11 NOTE — Care Management Important Message (Signed)
Important Message  Patient Details  Name: GURSIMRAN LITAKER MRN: [id number] Date of Birth: [birth year]   Medicare Important Message Given:  Yes     Shelda Altes 01/11/2019, 3:42 PM

## 2019-01-11 NOTE — Progress Notes (Signed)
CARDIAC REHAB PHASE I   PRE:  Rate/Rhythm: 75 SR  BP:  Supine: 127/59  Sitting:   Standing:    SaO2: 96%RA  MODE:  Ambulation: 430 ft   POST:  Rate/Rhythm: 84 SR  BP:  Supine:   Sitting: 133/84  Standing:    SaO2: 97%RA 0927-1005 Pt walked 430 ft on RA with hand held asst. A little wobbly. Pt stated weak left leg from old CVA.  Uses cane at home if walking long distance. Gave modified walking instructions for ex and reviewed some heart healthy food choices. Has heart healthy diet sheet. Pt voiced understanding. Husband in room.   Graylon Good, RN BSN  01/11/2019 10:00 AM

## 2019-01-11 NOTE — Telephone Encounter (Signed)
**Note De-Identified  Obfuscation** The pt is currently in the hospital. We will monitor her chart call her once she has been discharged.

## 2019-01-11 NOTE — Telephone Encounter (Signed)
**Note De-identified  Obfuscation** -----  **Note De-Identified  Obfuscation** Message from Stem, PA-C sent at 01/11/2019 10:02 AM EST ----- Regarding: [facility] phone call Patient is being discharged today for NSTEMI. She has a [facility] appointment Jan 5th. Can a TOC phone call please be arranged?  Thanks! Cadence

## 2019-01-11 NOTE — Progress Notes (Signed)
Progress Note  Patient Name: [name] Date of Encounter: 01/11/2019  Primary Cardiologist: Ena Dawley, MD   Subjective   Patient is feeling good this AM. No chest pain or SOB. Cath site, right radial is stable. Receiving IVF during exam.   Inpatient Medications    Scheduled Meds: . aspirin EC  81 mg Oral Daily  . atorvastatin  80 mg Oral q1800  . folic acid  1 mg Oral Daily  . heparin  5,000 Units Subcutaneous Q8H  . metoprolol tartrate  25 mg Oral BID  . mirtazapine  45 mg Oral QHS  . nicotine  7 mg Transdermal Daily  . omega-3 acid ethyl esters  1 g Oral Daily  . pantoprazole  40 mg Oral Daily  . sertraline  100 mg Oral BID  . sodium chloride flush  3 mL Intravenous Q12H   Continuous Infusions: . sodium chloride    . sodium chloride 50 mL/hr at 01/11/19 0600  . nitroGLYCERIN Stopped (01/10/19 1012)   PRN Meds: sodium chloride, acetaminophen, diazepam, nitroGLYCERIN, ondansetron (ZOFRAN) IV, sodium chloride flush   Vital Signs    Vitals:   01/10/19 2040 01/10/19 2349 01/10/19 2349 01/11/19 0320  BP: (!) 104/48 98/80  115/60  Pulse:   73   Resp:    18  Temp:   98.2 F (36.8 C) 98.1 F (36.7 C)  TempSrc:   Oral Oral  SpO2: 95%  96% 96%  Weight:    76.7 kg  Height:        Intake/Output Summary (Last 24 hours) at 01/11/2019 0710 Last data filed at 01/11/2019 0600 Gross per 24 hour  Intake 641.58 ml  Output --  Net 641.58 ml   Last 3 Weights 01/11/2019 01/10/2019 01/09/2019  Weight (lbs) 169 lb 1.5 oz 169 lb 12.1 oz 167 lb 15.9 oz  Weight (kg) 76.7 kg 77 kg 76.2 kg      Telemetry    NSR, HR 60-70s, pauses overnight <2 seconds - Personally Reviewed  ECG    No new - Personally Reviewed  Physical Exam   GEN: No acute distress.   Neck: No JVD Cardiac: RRR, no murmurs, rubs, or gallops.  Respiratory: Clear to auscultation bilaterally. GI: Soft, nontender, non-distended  MS: No edema; No deformity; right radial cath site is clean  and dry with no hematoma or bruit Neuro:  Nonfocal  Psych: Normal affect   Labs    High Sensitivity Troponin:   Recent Labs  Lab 01/08/19 2233 01/09/19 0053  TROPONINIHS 14,276* 21,213*      Chemistry Recent Labs  Lab 01/08/19 2233 01/09/19 0053 01/10/19 0809 01/10/19 1142 01/11/19 0438  NA 141 139 139  --  142  K 4.0 3.9 3.7  --  3.7  CL 110 109 109  --  110  CO2 22 23 22   --  22  GLUCOSE 120* 162* 119*  --  102*  BUN 13 13 13   --  8  CREATININE 0.83 0.89 0.83 0.82 0.76  CALCIUM 8.7* 8.4* 8.4*  --  8.6*  PROT 6.2*  --   --   --   --   ALBUMIN 3.6  --   --   --   --   AST 327*  --   --   --   --   ALT 133*  --   --   --   --   ALKPHOS 177*  --   --   --   --  BILITOT 0.6  --   --   --   --   GFRNONAA >60 >60 >60 >60 >60  GFRAA >60 >60 >60 >60 >60  ANIONGAP 9 7 8   --  10     Hematology Recent Labs  Lab 01/10/19 0651 01/10/19 1142 01/11/19 0438  WBC 8.2 8.2 6.7  RBC 3.73* 4.04 3.92  HGB 10.6* 11.4* 11.2*  HCT 33.3* 36.3 34.7*  MCV 89.3 89.9 88.5  MCH 28.4 28.2 28.6  MCHC 31.8 31.4 32.3  RDW 13.7 13.7 13.8  PLT 162 152 155    BNP Recent Labs  Lab 01/08/19 2233  BNP 186.4*     DDimer No results for input(s): DDIMER in the last 168 hours.   Radiology    CARDIAC CATHETERIZATION  Result Date: 01/10/2019  Previously placed Mid Cx to Dist Cx stent (unknown type) is widely patent.  Mid Cx lesion is 30% stenosed.  2nd Mrg-1 lesion is 40% stenosed.  Previously placed 2nd Mrg-2 stent (unknown type) is widely patent.  2nd Mrg-3 lesion is 100% stenosed.  Prox RCA lesion is 30% stenosed.  The left ventricular systolic function is normal.  LV end diastolic pressure is mildly elevated.  Non-ST segment elevation myocardial infarction secondary to distal total occlusion of the obtuse marginal branch of the left circumflex coronary artery, Normal left main, LAD and mild nonobstructive 30% narrowing in the RCA which supplies the posterior wall. The left  circumflex vessel has 40% ostial stenosis in the OM 2 vessel with a widely patent stent in the midportion of this marginal branch.  The distal marginal is 100% occluded.  There is faint collateralization from the LAD to the distal vessel which is small caliber; the AV groove circumflex has 30% narrowing in the stent in the AV groove circumflex is widely patent. Serve global LV function with EF estimated 55%.  There is a subtle focal area of distal inferior hypocontractility. RECOMMENDATION: The patient's ECG is entirely normal.  She is not having any recurrent chest pain.  Her MI was over 48 hours ago.  With her stability and distal occlusion recommend medical management.  Consider DAPT; however, will need to obtain additional information with her history of prior stroke following carotid endarterectomy remotely.  Alternatively aspirin alone and medical therapy for her current obstructive disease.  Smoking cessation is essential.  Aggressive lipid-lowering therapy with target LDL less than 70.   ECHOCARDIOGRAM COMPLETE  Result Date: 01/09/2019   ECHOCARDIOGRAM REPORT   Patient Name:   [name] [facility] Date of Exam: 01/09/2019 Medical Rec #:  [id number]           Height:       60.0 in Accession #:    [id number]          Weight:       168.0 lb Date of Birth:  1940/05/08           BSA:          1.73 m Patient Age:    78 years            BP:           124/59 mmHg Patient Gender: F                   HR:           77 bpm. Exam Location:  Inpatient Procedure: 2D Echo, Cardiac Doppler and Color Doppler Indications:    Chest pain  History:  Patient has prior history of Echocardiogram examinations, most                 recent 11/05/2018. CAD and Acute MI, COPD, PAD and Stroke,                 Mitral Valve Disease, Arrythmias:NSVT; Risk Factors:Current                 Smoker and Hypertension.  Sonographer:    Dustin Flock Referring Phys: [id number] [place]  1. Left ventricular ejection  fraction, by visual estimation, is 60 to 65%. The left ventricle has normal function. There is borderline left ventricular hypertrophy.  2. Left ventricular diastolic parameters are consistent with Grade II diastolic dysfunction (pseudonormalization).  3. The left ventricle has no regional wall motion abnormalities.  4. Global right ventricle was not well visualized.The right ventricular size is normal. No increase in right ventricular wall thickness.  5. Left atrial size was mild-moderately dilated.  6. Right atrial size was normal.  7. Presence of pericardial fat pad.  8. Small pericardial effusion.  9. The mitral valve is rheumatic. Trivial mitral valve regurgitation. Mild mitral stenosis. 10. The tricuspid valve is normal in structure. Tricuspid valve regurgitation is trivial. 11. The aortic valve is tricuspid. Aortic valve regurgitation is not visualized. No evidence of aortic valve sclerosis or stenosis. 12. Pulmonic regurgitation is mild. 13. The pulmonic valve was grossly normal. Pulmonic valve regurgitation is mild. FINDINGS  Left Ventricle: Left ventricular ejection fraction, by visual estimation, is 60 to 65%. The left ventricle has normal function. The left ventricle has no regional wall motion abnormalities. There is borderline left ventricular hypertrophy. Concentric left ventricular hypertrophy. Left ventricular diastolic parameters are consistent with Grade II diastolic dysfunction (pseudonormalization). Right Ventricle: The right ventricular size is normal. No increase in right ventricular wall thickness. Global RV systolic function is was not well visualized. Left Atrium: Left atrial size was mild-moderately dilated. Right Atrium: Right atrial size was normal in size Pericardium: A small pericardial effusion is present. Presence of pericardial fat pad. Mitral Valve: The mitral valve is rheumatic. Trivial mitral valve regurgitation. Mild mitral valve stenosis by observation. MV peak gradient, 21.3  mmHg. Tricuspid Valve: The tricuspid valve is normal in structure. Tricuspid valve regurgitation is trivial. Aortic Valve: The aortic valve is tricuspid. Aortic valve regurgitation is not visualized. The aortic valve is structurally normal, with no evidence of sclerosis or stenosis. Pulmonic Valve: The pulmonic valve was grossly normal. Pulmonic valve regurgitation is mild. Pulmonic regurgitation is mild. Aorta: The aortic root, ascending aorta and aortic arch are all structurally normal, with no evidence of dilitation or obstruction. IAS/Shunts: No atrial level shunt detected by color flow Doppler.  LEFT VENTRICLE PLAX 2D LVIDd:         3.50 cm  Diastology LVIDs:         2.50 cm  LV e' lateral:   5.55 cm/s LV PW:         1.30 cm  LV E/e' lateral: 32.3 LV IVS:        1.30 cm  LV e' medial:    4.46 cm/s LVOT diam:     2.20 cm  LV E/e' medial:  40.1 LV SV:         29 ml LV SV Index:   15.60 LVOT Area:     3.80 cm  RIGHT VENTRICLE RV Basal diam:  2.40 cm RV S prime:     6.09 cm/s LEFT ATRIUM  Index       RIGHT ATRIUM           Index LA diam:      4.20 cm 2.42 cm/m  RA Area:     14.00 cm LA Vol (A2C): 44.3 ml 25.56 ml/m RA Volume:   34.00 ml  19.62 ml/m LA Vol (A4C): 48.1 ml 27.75 ml/m  AORTIC VALVE LVOT Vmax:   70.90 cm/s LVOT Vmean:  48.600 cm/s LVOT VTI:    0.167 m  AORTA Ao Root diam: 2.90 cm MITRAL VALVE MV Area (PHT): 3.85 cm              SHUNTS MV Peak grad:  21.3 mmHg             Systemic VTI:  0.17 m MV Mean grad:  9.0 mmHg              Systemic Diam: 2.20 cm MV Vmax:       2.31 m/s MV Vmean:      138.0 cm/s MV VTI:        0.58 m MV PHT:        57.13 msec MV Decel Time: 197 msec MV E velocity: 179.00 cm/s 103 cm/s MV A velocity: 149.00 cm/s 70.3 cm/s MV E/A ratio:  1.20        1.5  Buford Dresser MD Electronically signed by Buford Dresser MD Signature Date/Time: 01/09/2019/3:50:24 PM    Final     Cardiac Studies   Echo 11/09/18 1. Left ventricular ejection fraction, by  visual estimation, is 60 to 65%. The left ventricle has normal function. There is borderline left ventricular hypertrophy. 2. Left ventricular diastolic parameters are consistent with Grade II diastolic dysfunction (pseudonormalization). 3. The left ventricle has no regional wall motion abnormalities. 4. Global right ventricle was not well visualized.The right ventricular size is normal. No increase in right ventricular wall thickness. 5. Left atrial size was mild-moderately dilated. 6. Right atrial size was normal. 7. Presence of pericardial fat pad. 8. Small pericardial effusion. 9. The mitral valve is rheumatic. Trivial mitral valve regurgitation. Mild mitral stenosis. 10. The tricuspid valve is normal in structure. Tricuspid valve regurgitation is trivial. 11. The aortic valve is tricuspid. Aortic valve regurgitation is not visualized. No evidence of aortic valve sclerosis or stenosis. 12. Pulmonic regurgitation is mild. 13. The pulmonic valve was grossly normal. Pulmonic valve regurgitation is mild.  Cardiac cath 01/10/19  Previously placed Mid Cx to Dist Cx stent (unknown type) is widely patent.  Mid Cx lesion is 30% stenosed.  2nd Mrg-1 lesion is 40% stenosed.  Previously placed 2nd Mrg-2 stent (unknown type) is widely patent.  2nd Mrg-3 lesion is 100% stenosed.  Prox RCA lesion is 30% stenosed.  The left ventricular systolic function is normal.  LV end diastolic pressure is mildly elevated.   Non-ST segment elevation myocardial infarction secondary to distal total occlusion of the obtuse marginal branch of the left circumflex coronary artery,  Normal left main, LAD and mild nonobstructive 30% narrowing in the RCA which supplies the posterior wall.  The left circumflex vessel has 40% ostial stenosis in the OM 2 vessel with a widely patent stent in the midportion of this marginal branch.  The distal marginal is 100% occluded.  There is faint collateralization from  the LAD to the distal vessel which is small caliber; the AV groove circumflex has 30% narrowing in the stent in the AV groove circumflex is widely patent.  Serve global LV function  with EF estimated 55%.  There is a subtle focal area of distal inferior hypocontractility.  RECOMMENDATION: The patient's ECG is entirely normal.  She is not having any recurrent chest pain.  Her MI was over 48 hours ago.  With her stability and distal occlusion recommend medical management.  Consider DAPT; however, will need to obtain additional information with her history of prior stroke following carotid endarterectomy remotely.  Alternatively aspirin alone and medical therapy for her current obstructive disease.  Smoking cessation is essential.  Aggressive lipid-lowering therapy with target LDL less than 70.   inferoapical hypocontractility.  LVEDP is 21 mm.  Coronary Diagrams  Diagnostic Dominance: Co-dominant      Patient Profile     78 y.o. female is a 78 year old with CAD s/p DES x 2 in 2014, PAD, hypertension, hyperlipidemia, NSVT, tobacco abuse, CVA in the setting of CEA who presented with severe CP found to have NSTEMI.   Assessment & Plan    NSTEMI/CAD Patient presented to [facility] for recurrent chest pain. HS troponin peak to 21,213. EKG with no acute changes. She was started on IV NTG and IV heparin and transferred to [facility] - Since arriving has been chest pain free  - Echo showed EF 60-65%, borderline LV hypertrophy, G2DD, no WMA, rheumatic mitral valve, small pericardial effusion - cardiac cath showed normal left main, LAD and mild nonobstructive 30% narrowing in the RCA, left Cx with 40% ostial stenosis in the OM 2 with a widely patent stent, distal marginal is 100% occluded. AV groove Cx 30% narrowing. Recommend medical management. Consider DAPT.  - creatinine 0.76. Hgb 11.2 - Cath site right radial is clean and dry - continue Aspirin. Will discuss DAPT with MD.   PVD s/p  carotid enarterectomy - complicated by post-op stroke resulting in mild left hemiplegia  Tobacco abuse - recommend cessation  HLD - continue atorvastatin 80 mg daily - LDL 58  HTN - controlled on metoprolol 25 mg BID   For questions or updates, please contact [place] Please consult [email or web address] for contact info under        Signed, Devonne Kitchen Ninfa Meeker, PA-C  01/11/2019, 7:10 AM

## 2019-01-12 NOTE — Telephone Encounter (Signed)
Left message to call office

## 2019-01-13 NOTE — Telephone Encounter (Signed)
Patient contacted regarding discharge from  [facility] on 01/11/19  Patient understands to follow up with provider yes--Lori Servando Snare, NP on January 5,2021 at 10:00 at [facility] office Patient understands discharge instructions? yes Patient understands medications and regiment? yes Patient understands to bring all medications to this visit? yes

## 2019-01-17 ENCOUNTER — Other Ambulatory Visit: Payer: Self-pay

## 2019-01-17 NOTE — Patient Outreach (Signed)
Red Emmi:  Reviewed medical record and reason for red emmi: Date of call: 01/16/2019 Reason for alert: feeling depress, sad and hopeless   Placed call to patient who answered and reports that she is doing well.. Reports that she had a heart attack on 01/09/2019 and was discharged home on 01/12/2019.  Reports she has had some depression but is feeling better. Reports on medication for depression.  Reviewed normal to have some sadness after a major health emergency.  Reviewed with patient strategies to keep herself busy and occupied.  Patient reports she has discussed depression with MD.  Patient also states she has her follow up appointments made and has all her medications. Offered to assist patient in any way and she denies needing any assistance.  PLAN: close case as no needs identified.  Tomasa Rand, RN, BSN, CEN [facility] [employer] [phone]

## 2019-01-20 ENCOUNTER — Telehealth: Payer: Self-pay | Admitting: Nurse Practitioner

## 2019-01-20 NOTE — Telephone Encounter (Signed)
The patient is calling because she has mobility issues and will need to have her husband accompany her to the 1/5 appointment with Cecille Rubin.

## 2019-01-20 NOTE — Telephone Encounter (Signed)
Patient is calling requesting her husband attend her upcoming appointment with her on 01/25/19 due to her having trouble walking.

## 2019-01-21 NOTE — Progress Notes (Signed)
CARDIOLOGY OFFICE NOTE  Date:  01/25/2019    [name] Date of Birth: 1940/07/17 Medical Record #[id number]  PCP:  [name] Dress, MD  Cardiologist:  [name]  Chief Complaint  Patient presents with  . Follow-up    Seen for [name]/[place]    History of Present Illness: [name] is a 79 y.o. female who presents today for a follow up/post hospital visit. Seen for [name].   She has a history of HLD, HTN, PAD and CAD. Lifelong smoker. She underwent left heart catheterization November 2014 showing severe OM1 stenosis and received 2 drug-eluting stents.  She had a carotid endarterectomy in 3382 complicated by postoperative stroke with left hemiplegia.    She was seen back in this office back in May with frequent falls - no prodromal symptoms and unclear etiology. Orthostatic hypotension was ruled out. Negative ear evaluation. Husband noted she had had LOC with one spell. She has been off and on of beta blocker. Monitor was obtained and this showed NSVT - she was referred to EP and saw [name]. Placed back on beta blocker therapy and had dose increased - discussed possible therapy with amiodarone as well as ICD - she was instructed she could not drive.   She then presented right before Christmas from [facility] with chest pain. Troponins were high. EKG normal. MI 48 hours prior. Cath showed distal occlusion of a marginal branch from the LCX - medical management was to continue. To consider DAPT - was deferred to her primary cardiologist - [name]. Needed more information regarding prior stroke. Non obstructive disease otherwise. EF is normal - her echo was updated from October.   Subtle focal area of distal inferior hypocontractility..  The patient does not have symptoms concerning for COVID-19 infection (fever, chills, cough, or new shortness of breath).   Comes in today. Here with her husband. She is not steady with her gait.  She has questions about her recent event. She is not having any more chest pain. She has not stopped smoking. She is doing the patches. She is having more issues with her abdomen. She is due for EGD/colonoscopy - apparently been placed on hold - sees [name] in [place]. Bowels working ok. She is not on DAPT but this was to be considered. She has had prior stroke with ICH/SDH from 2015 noted.  She notes her abdominal pain was present prior to her recent event - now worse - weight is unchanged. No bleeding noted. Notes it is just constant - does not get better/worse with eating - sometimes better with moving - worse with lying back down. She is bruising. She is tired. Does not really feel like going to rehab at this time. She is down to about 10 cigs per day.  No further chest pain. She did not have any medicine changes noted.   Past Medical History:  Diagnosis Date  . Acute respiratory failure with hypoxia s/p tracheostomy 07/2013  . Anemia    a. 7-08/2013 felt due to recent critical illness/chronic disease.  . Arthritis    handds, knees & back   . Barrett's esophagus   . CAD (coronary artery disease)    a. Mod dz 2010 initially mgd medically. b. 12/2012 - angina s/p PTCA/DES to mid-circumflex, PTCA/DES to first OM.   Marland Kitchen Carotid artery disease ([place])    a. 60-70% bilat ICA stenosis by dopplers 05/2012.  Marland Kitchen Chronic back pain  deteriorating;DDD  . Colonic obstruction due to suspected colitis; s/p colectomy/colostomy    a. See SBO.  Marland Kitchen Complication of anesthesia    hard to wake up from anesthesia   . COPD (chronic obstructive pulmonary disease) ([place])   . Depression    takes Remeron and Zoloft daily  . Dizziness    was on Bentyl which caused this-took off of it and no problems since  . DVT of axillary vein, acute right ([place])    a. Dx 07/2013 felt due to R IJ central line that had been inserted 7/14. Not on anticoag due to GIB issues and small cerebral bleed.  Marland Kitchen GERD (gastroesophageal reflux  disease)   . Heart murmur   . History of bronchitis    > 33yrs ago   . History of colon polyps   . History of hiatal hernia   . History of kidney stones    was told it was stable but doesn't know for sure that she ever passed it  . HTN (hypertension)   . Hypertension    takes Metoprolol daily  . Intracranial hemorrhage ([place])    a. 08/11/13 - per DC summary, tiny SAH vs SDH done for altered mentation, anticoag stopped including aspirin.  . Joint pain   . Neck pain    DDD  . Other and unspecified hyperlipidemia    takes Lipitor daily  . Pneumonia    hx of->15 yrs ago  . Protein-calorie malnutrition, severe w/ electrolyte imbalance    a. Severe hypoalbuminemia leading to 3rd spacing including anasarca and pulm edema 07/2013.  Marland Kitchen SBO (small bowel obstruction) ([place])    a. Lysis of adhesions and ovarian cystectomy 04/2013 for sBO. b. Admitted 07/2013 for colonic obstruction due to suspected colitis, s/p partial colectomy/colostomy 08/01/13. Admission complicated by anasarca, acute resp failure requiring tracheostomy, decannulated 08/25/13. c. Recurrent SBO8/2015, NGT placed for decompression.   . Stroke [facility]) early 80's   right sided weakness--TIA    Past Surgical History:  Procedure Laterality Date  . ABDOMINAL HYSTERECTOMY    . APPENDECTOMY    . BOWEL RESECTION N/A 08/01/2013   Procedure: SMALL BOWEL RESECTION;  Surgeon: Harl Bowie, MD;  Location: [place];  Service: General;  Laterality: N/A;  . cataract surgery Bilateral   . CHOLECYSTECTOMY    . COLON RESECTION    . COLON SURGERY    . COLOSTOMY N/A 08/01/2013   Procedure: COLOSTOMY;  Surgeon: Harl Bowie, MD;  Location: [place];  Service: General;  Laterality: N/A;  . COLOSTOMY TAKEDOWN  01/24/2014   dr blackman  . COLOSTOMY TAKEDOWN N/A 01/24/2014   Procedure: COLOSTOMY TAKEDOWN;  Surgeon: Coralie Keens, MD;  Location: [place];  Service: General;  Laterality: N/A;  . CORONARY ANGIOPLASTY WITH STENT PLACEMENT  12/20/2012    STENT TO OM         DR COOPER  . ENDARTERECTOMY Right 08/08/2014   Procedure: RIGHT CAROTID ENDARTERECTOMY WITH HEMASHIELD PATCH ANGIOPLASTY;  Surgeon: Elam Dutch, MD;  Location: [place];  Service: Vascular;  Laterality: Right;  . FOOT SURGERY Left   . HAND SURGERY Bilateral    for ganglion cysts  . LAPAROTOMY N/A 08/01/2013   Procedure: EXPLORATORY LAPAROTOMY;  Surgeon: Harl Bowie, MD;  Location: [place];  Service: General;  Laterality: N/A;  . LEFT HEART CATH AND CORONARY ANGIOGRAPHY N/A 01/10/2019   Procedure: LEFT HEART CATH AND CORONARY ANGIOGRAPHY;  Surgeon: Troy Sine, MD;  Location: [place] CV LAB;  Service:  Cardiovascular;  Laterality: N/A;  . LEFT HEART CATHETERIZATION WITH CORONARY ANGIOGRAM N/A 12/20/2012   Procedure: LEFT HEART CATHETERIZATION WITH CORONARY ANGIOGRAM;  Surgeon: Blane Ohara, MD;  Location: [facility] CATH LAB;  Service: Cardiovascular;  Laterality: N/A;  . PARTIAL COLECTOMY N/A 08/01/2013   Procedure: PARTIAL COLECTOMY;  Surgeon: Harl Bowie, MD;  Location: [place];  Service: General;  Laterality: N/A;  . ROTATOR CUFF REPAIR Left   . TONSILLECTOMY    . TRACHEOSTOMY     feinstein     Medications: Current Meds  Medication Sig  . aspirin EC 81 MG tablet Take 162 mg by mouth daily.  Marland Kitchen atorvastatin (LIPITOR) 80 MG tablet Take 80 mg by mouth every evening.   Marland Kitchen buPROPion (WELLBUTRIN XL) 150 MG 24 hr tablet Take 1 tablet (150 mg total) by mouth daily.  . Clobetasol Prop Emollient Base 0.05 % emollient cream Apply 1 application topically daily as needed (Skin irritation.).   Marland Kitchen docusate sodium (COLACE) 100 MG capsule Take 100 mg by mouth as needed for mild constipation.   . fish oil-omega-3 fatty acids 1000 MG capsule Take 1,000 mg by mouth daily.   . fluticasone (FLONASE) 50 MCG/ACT nasal spray Place 1 spray into both nostrils daily as needed for allergies.  . folic acid (FOLVITE) 601 MCG tablet Take 800 mcg by mouth daily.   . metoprolol succinate  (TOPROL-XL) 50 MG 24 hr tablet Take 1 tablet (50 mg total) by mouth daily. Take with or immediately following a meal.  . mirtazapine (REMERON) 45 MG tablet Take 45 mg by mouth at bedtime.   . nicotine (NICODERM CQ - DOSED IN MG/24 HR) 7 mg/24hr patch Place 1 patch (7 mg total) onto the skin daily.  . nitroGLYCERIN (NITROSTAT) 0.4 MG SL tablet Place 1 tablet (0.4 mg total) under the tongue every 5 (five) minutes as needed for chest pain.  Marland Kitchen ondansetron (ZOFRAN) 4 MG tablet Take 4 mg by mouth every 8 (eight) hours as needed for nausea or vomiting.   . pantoprazole (PROTONIX) 40 MG tablet Take 40 mg by mouth daily.   . sertraline (ZOLOFT) 100 MG tablet Take 100 mg by mouth 2 (two) times daily.     Allergies: Allergies  Allergen Reactions  . Pregabalin Swelling    Tongue swelling  . Sulfonamide Derivatives Swelling    Childhood reaction    Social History: The patient  reports that she has been smoking cigarettes. She has a 13.25 pack-year smoking history. She has never used smokeless tobacco. She reports that she does not drink alcohol or use drugs.   Family History: The patient's family history includes AAA (abdominal aortic aneurysm) in her father; Heart attack in her father; Heart disease in her father and another family member; Varicose Veins in her mother.   Review of Systems: Please see the history of present illness.   All other systems are reviewed and negative.   Physical Exam: VS:  BP 128/76   Pulse 80   Ht 5' (1.524 m)   Wt 169 lb (76.7 kg)   SpO2 97%   BMI 33.01 kg/m  .  BMI Body mass index is 33.01 kg/m.  Wt Readings from Last 3 Encounters:  01/25/19 169 lb (76.7 kg)  01/11/19 169 lb 1.5 oz (76.7 kg)  11/09/18 170 lb 3.2 oz (77.2 kg)    General: Pleasant. Elderly. Alert. She looks chronically ill but in no acute distress. Color seems off to me.   HEENT: Normal.  Neck:  Supple, no JVD, carotid bruits, or masses noted.  Cardiac:Heart tones are distant. No edema.    Respiratory:  Lungs are fairly clear to auscultation bilaterally with normal work of breathing.  GI: Soft but tender over the upper epigastrium. + bowel sounds noted.  MS: No deformity or atrophy. Gait and ROM intact.  Skin: Warm and dry. Color is normal.  Neuro:  Strength and sensation are intact and no gross focal deficits noted.  Psych: Alert, appropriate and with normal affect.   LABORATORY DATA:  EKG:  EKG is not ordered today.   Lab Results  Component Value Date   WBC 6.7 01/11/2019   HGB 11.2 (L) 01/11/2019   HCT 34.7 (L) 01/11/2019   PLT 155 01/11/2019   GLUCOSE 102 (H) 01/11/2019   CHOL 117 01/09/2019   TRIG 86 01/09/2019   HDL 42 01/09/2019   LDLCALC 58 01/09/2019   ALT 133 (H) 01/08/2019   AST 327 (H) 01/08/2019   NA 142 01/11/2019   K 3.7 01/11/2019   CL 110 01/11/2019   CREATININE 0.76 01/11/2019   BUN 8 01/11/2019   CO2 22 01/11/2019   TSH 4.964 (H) 01/08/2019   INR 1.02 01/23/2015   HGBA1C 6.5 (H) 01/08/2019     BNP (last 3 results) Recent Labs    01/08/19 2233  BNP 186.4*    ProBNP (last 3 results) No results for input(s): PROBNP in the last 8760 hours.   Other Studies Reviewed Today:  Cardiac cath 01/10/19  Previously placed Mid Cx to Dist Cx stent (unknown type) is widely patent.  Mid Cx lesion is 30% stenosed.  2nd Mrg-1 lesion is 40% stenosed.  Previously placed 2nd Mrg-2 stent (unknown type) is widely patent.  2nd Mrg-3 lesion is 100% stenosed.  Prox RCA lesion is 30% stenosed.  The left ventricular systolic function is normal.  LV end diastolic pressure is mildly elevated.  Non-ST segment elevation myocardial infarction secondary to distal total occlusion of the obtuse marginal branch of the left circumflex coronary artery,  Normal left main, LAD and mild nonobstructive 30% narrowing in the RCA which supplies the posterior wall.  The left circumflex vessel has 40% ostial stenosis in the OM 2 vessel with a widely patent  stent in the midportion of this marginal branch. The distal marginal is 100% occluded. There is faint collateralization from the LAD to the distal vessel which is small caliber; the AV groove circumflex has 30% narrowing in the stent in the AV groove circumflex is widely patent.  Serve global LV function with EF estimated 55%. There is a subtle focal area of distal inferior hypocontractility.  RECOMMENDATION: The patient's ECG is entirely normal. She is not having any recurrent chest pain. Her MI was over 48 hours ago. With her stability and distal occlusion recommend medical management. Consider DAPT; however, will need to obtain additional information with her history of prior stroke following carotid endarterectomy remotely. Alternatively aspirin alone and medical therapy for her current obstructive disease. Smoking cessation is essential. Aggressive lipid-lowering therapy with target LDL less than 70.  inferoapical hypocontractility. LVEDP is 21 mm.   Coronary Diagrams  Diagnostic Dominance: Co-dominant       _____________  ECHO IMPRESSIONS 12/2018   1. Left ventricular ejection fraction, by visual estimation, is 60 to 65%. The left ventricle has normal function. There is borderline left ventricular hypertrophy.  2. Left ventricular diastolic parameters are consistent with Grade II diastolic dysfunction (pseudonormalization).  3. The left ventricle has no regional  wall motion abnormalities.  4. Global right ventricle was not well visualized.The right ventricular size is normal. No increase in right ventricular wall thickness.  5. Left atrial size was mild-moderately dilated.  6. Right atrial size was normal.  7. Presence of pericardial fat pad.  8. Small pericardial effusion.  9. The mitral valve is rheumatic. Trivial mitral valve regurgitation. Mild mitral stenosis. 10. The tricuspid valve is normal in structure. Tricuspid valve regurgitation is  trivial. 11. The aortic valve is tricuspid. Aortic valve regurgitation is not visualized. No evidence of aortic valve sclerosis or stenosis. 12. Pulmonic regurgitation is mild. 13. The pulmonic valve was grossly normal. Pulmonic valve regurgitation is mild.    TTE 11/05/18   1. Left ventricular ejection fraction, by visual estimation, is 60 to 65%. The left ventricle has normal function. Normal left ventricular size. There is no left ventricular hypertrophy. 2. Global right ventricle has normal systolic function.The right ventricular size is normal. No increase in right ventricular wall thickness. 3. The right atrium is normal. 4. The left atrium is moderate dilated. 5. The mitral valve is rheumatic. Mild doming of the anterior mitral valve leaflet. Mild mitral valve stenosis by observation. Mild mitral valve stenosis. Mitral valve area PHT measures 1.75 cm. MV peak gradient 13.1 mmHg and mean gradient 66mmHg. Mild  to moderate mitral valve regurgitation, with eccentric laterally directed jet. 6. Tricuspid valve is normal. Tricuspid regurgitation was not visualized by color flow Doppler. 7. The aortic valve is normal in structure. Aortic valve regurgitation was not visualized by color flow Doppler. Structurally normal aortic valve, with no evidence of sclerosis or stenosis. 8. The pulmonic valve was normal in structure. Pulmonic valve regurgitation is trivial by color flow Doppler. 9. TR signal is inadequate for assessing pulmonary artery systolic pressure. 10. There was no pericardial effusion.  Cardiac monitor 11/02/18 personally reviewed  Sinus rhythm to sinus tachycardia.  Frequent nsVTs with maximum rate 177 BPM, the longest lasting 12 seconds.  Frequent short runs of SVTs.  Sinus rhythm to sinus tachycardia. Frequent nsVTs with maximum rate 177 BPM, the longest lasting 12 seconds.  ASSESSMENT AND PLAN:  1. Recent NSTEMI with cardiac cath showing occlusion of a  distal marginal branch - otherwise non obstructive disease - medical management recommended. To consider DAPT - she is on aspirin solely at this time. She has excessive bruising noted. LFTs pretty high and have not been rechecked. No further chest pain but having some GI ? Liver issue. Worrisome.   2. Abdominal pain - elevated LFTS - rechecking today. She has had her EGD and colonoscopy (has Barrett's) placed on hold due to recent events - she is to call and see GI regarding her current discomfort and elevated LFTs - rechecking LFTS today. She seems Laverna Peace for PCP.   3. Prior Ventricular tachycardia: Noted on her ZIO monitor.  She did not have symptoms of falls or loss of consciousness while wearing the monitor.  Her metoprolol was restarted - there was discussion about amiodarone (which would not do at this time until LFTS are sorted out) She did not wish to consider ICD - she was to see [name] inabout 10 days - will push that visit out due to current events. She is not to drive.   4. HTN - BP is ok.   5. Prior LCX and OM disease treatment with PCI back in 2014 - off DAPT since 2015 which may have coincided with the ICH/SDH - unclear to me. See #  1.   6. PAD  7. Prior falls - Zio did not correlate but did show VT - no further falls noted.   8. Prior CVA - chart mentions history of ICH/SDH - would hold on use of DAPT - can address on return with [name].   9. Tobacco abuse - smoking less - not sure she is going to be able to stop.   10. COVID-19 Education: The signs and symptoms of COVID-19 were discussed with the patient and how to seek care for testing (follow up with PCP or arrange E-visit).  The importance of social distancing, staying at home, hand hygiene and wearing a mask when out in public were discussed today.  Current medicines are reviewed with the patient today.  The patient does not have concerns regarding medicines other than what has been noted above.  The following  changes have been made:  See above.  Labs/ tests ordered today include:    Orders Placed This Encounter  Procedures  . Hepatic function panel  . Basic metabolic panel  . CBC no Diff  . TSH     Disposition:   FU with [name] later this month. I have moved her visit with EP out til March/April.    Patient is agreeable to this plan and will call if any problems develop in the interim.   SignedTruitt Merle, NP  01/25/2019 10:29 AM  [place] [address] [place], [place]  [address] Phone: [phone] Fax: [phone]

## 2019-01-25 ENCOUNTER — Encounter: Payer: Self-pay | Admitting: Nurse Practitioner

## 2019-01-25 ENCOUNTER — Ambulatory Visit (INDEPENDENT_AMBULATORY_CARE_PROVIDER_SITE_OTHER): Payer: Medicare Other | Admitting: Nurse Practitioner

## 2019-01-25 ENCOUNTER — Other Ambulatory Visit: Payer: Self-pay

## 2019-01-25 VITALS — BP 128/76 | HR 80 | Ht 60.0 in | Wt 169.0 lb

## 2019-01-25 DIAGNOSIS — I63411 Cerebral infarction due to embolism of right middle cerebral artery: Secondary | ICD-10-CM | POA: Diagnosis not present

## 2019-01-25 DIAGNOSIS — I214 Non-ST elevation (NSTEMI) myocardial infarction: Secondary | ICD-10-CM | POA: Diagnosis not present

## 2019-01-25 DIAGNOSIS — R1010 Upper abdominal pain, unspecified: Secondary | ICD-10-CM

## 2019-01-25 DIAGNOSIS — I472 Ventricular tachycardia, unspecified: Secondary | ICD-10-CM

## 2019-01-25 DIAGNOSIS — R7989 Other specified abnormal findings of blood chemistry: Secondary | ICD-10-CM | POA: Diagnosis not present

## 2019-01-25 DIAGNOSIS — I2510 Atherosclerotic heart disease of native coronary artery without angina pectoris: Secondary | ICD-10-CM | POA: Diagnosis not present

## 2019-01-25 DIAGNOSIS — Z7189 Other specified counseling: Secondary | ICD-10-CM

## 2019-01-25 DIAGNOSIS — I10 Essential (primary) hypertension: Secondary | ICD-10-CM

## 2019-01-25 LAB — BASIC METABOLIC PANEL
BUN/Creatinine Ratio: 13 (ref 12–28)
BUN: 12 mg/dL (ref 8–27)
CO2: 20 mmol/L (ref 20–29)
Calcium: 9.2 mg/dL (ref 8.7–10.3)
Chloride: 106 mmol/L (ref 96–106)
Creatinine, Ser: 0.95 mg/dL (ref 0.57–1.00)
GFR calc Af Amer: 66 mL/min/{1.73_m2} (ref >59)
GFR calc non Af Amer: 57 mL/min/{1.73_m2} — ABNORMAL LOW (ref >59)
Glucose: 89 mg/dL (ref 65–99)
Potassium: 4.2 mmol/L (ref 3.5–5.2)
Sodium: 139 mmol/L (ref 134–144)

## 2019-01-25 LAB — HEPATIC FUNCTION PANEL
ALT: 12 IU/L (ref 0–32)
AST: 15 IU/L (ref 0–40)
Albumin: 4.3 g/dL (ref 3.7–4.7)
Alkaline Phosphatase: 129 IU/L — ABNORMAL HIGH (ref 39–117)
Bilirubin Total: 0.4 mg/dL (ref 0.0–1.2)
Bilirubin, Direct: 0.13 mg/dL (ref 0.00–0.40)
Total Protein: 6.5 g/dL (ref 6.0–8.5)

## 2019-01-25 LAB — CBC
Hematocrit: 36.7 % (ref 34.0–46.6)
Hemoglobin: 11.9 g/dL (ref 11.1–15.9)
MCH: 28.3 pg (ref 26.6–33.0)
MCHC: 32.4 g/dL (ref 31.5–35.7)
MCV: 87 fL (ref 79–97)
Platelets: 281 10*3/uL (ref 150–450)
RBC: 4.21 x10E6/uL (ref 3.77–5.28)
RDW: 13.5 % (ref 11.7–15.4)
WBC: 8.4 10*3/uL (ref 3.4–10.8)

## 2019-01-25 LAB — TSH: TSH: 2.01 u[IU]/mL (ref 0.450–4.500)

## 2019-01-25 NOTE — Patient Instructions (Addendum)
After Visit Summary:  We will be checking the following labs today - BMET, CBC, HPF and TSH   Medication Instructions:    Continue with your current medicines.   We may need to cut the Lipitor back - we will call you once we see your labs.    If you need a refill on your cardiac medications before your next appointment, please call your pharmacy.     Testing/Procedures To Be Arranged:  N/A  Follow-Up:   See Dr. Meda Coffee   Move out next appointment with Dr. Curt Bears to like 3 months from now.     At [facility], you and your health needs are our priority.  As part of our continuing mission to provide you with exceptional heart care, we have created designated Provider Care Teams.  These Care Teams include your primary Cardiologist (physician) and Advanced Practice Providers (APPs -  Physician Assistants and Nurse Practitioners) who all work together to provide you with the care you need, when you need it.  Special Instructions:  . Stay safe, stay home, wash your hands for at least 20 seconds and wear a mask when out in public.  . It was good to talk with you today.  . Call Dr. Melina Copa and get an appointment to be seen for your belly pain.    Call the [place] office at [phone] if you have any questions, problems or concerns.

## 2019-01-26 ENCOUNTER — Telehealth: Payer: Self-pay | Admitting: Nurse Practitioner

## 2019-01-26 NOTE — Telephone Encounter (Signed)
Patient would like lab results, and she would like her records to Dr. Nehemiah Settle.

## 2019-02-01 DIAGNOSIS — R748 Abnormal levels of other serum enzymes: Secondary | ICD-10-CM | POA: Diagnosis not present

## 2019-02-01 DIAGNOSIS — R1013 Epigastric pain: Secondary | ICD-10-CM | POA: Diagnosis not present

## 2019-02-07 ENCOUNTER — Ambulatory Visit: Payer: Medicare Other | Admitting: Cardiology

## 2019-02-08 DIAGNOSIS — R1013 Epigastric pain: Secondary | ICD-10-CM | POA: Diagnosis not present

## 2019-02-09 ENCOUNTER — Telehealth: Payer: Self-pay | Admitting: Cardiology

## 2019-02-09 NOTE — Telephone Encounter (Signed)
Pt is calling to say her husband will need to accompany her to her appt, due to not being able to ambulate by herself.  Informed the pt that her husband can absolutely accompany her to her visit with Dr. Meda Coffee on this Friday 1/22, and a note has been placed in appt notes.  Advised the pt that both should wear their mask to this appt and during the entire duration of the appt.  Pt verbalized understanding and agrees with this plan.

## 2019-02-09 NOTE — Telephone Encounter (Signed)
New Message:    Pt have an appt on Friday with Dr Meda Coffee. She said her husband will need to come in with her. She have problems falling.

## 2019-02-11 ENCOUNTER — Ambulatory Visit (INDEPENDENT_AMBULATORY_CARE_PROVIDER_SITE_OTHER): Payer: Medicare Other | Admitting: Cardiology

## 2019-02-11 ENCOUNTER — Other Ambulatory Visit: Payer: Self-pay

## 2019-02-11 ENCOUNTER — Encounter: Payer: Self-pay | Admitting: Cardiology

## 2019-02-11 VITALS — BP 126/72 | HR 82 | Ht 60.0 in | Wt 170.0 lb

## 2019-02-11 DIAGNOSIS — I10 Essential (primary) hypertension: Secondary | ICD-10-CM | POA: Diagnosis not present

## 2019-02-11 DIAGNOSIS — R1010 Upper abdominal pain, unspecified: Secondary | ICD-10-CM

## 2019-02-11 DIAGNOSIS — I480 Paroxysmal atrial fibrillation: Secondary | ICD-10-CM

## 2019-02-11 DIAGNOSIS — I214 Non-ST elevation (NSTEMI) myocardial infarction: Secondary | ICD-10-CM | POA: Diagnosis not present

## 2019-02-11 DIAGNOSIS — E782 Mixed hyperlipidemia: Secondary | ICD-10-CM

## 2019-02-11 NOTE — Progress Notes (Signed)
Cardiology Office Note:    Date:  02/11/2019   ID:  [name], DOB 08/10/1940, MRN [id number]  PCP:  [name], MD  Cardiologist:  Ena Dawley, MD  Electrophysiologist:  Constance Haw, MD   Referring MD: [name], MD   Chief complain: Posthospitalization follow-up  History of Present Illness:    [name] is a 79 y.o. female with a hx of hyperlipidemia, hypertension, PAD and CAD who returns today for a 1 year follow up. The patient is a lifelong smoker. In November 2014 she underwent a cardiac cath on 12/20/2012 with finding of severe stenosis in OM1 and received 2 DES. In May 2015 had a SBO and required partial colon resection.  She underwent right carotid endarterectomy on 65/78/4696 that was complicated by postop stroke in the recovery room. This resulted in a left hemiplegia. She was hospitalized in [facility] on 01/15/2016 acute respiratory failure and diagnosed with pneumonia and treated with IV antibiotics.   10/01/2018 - she is coming after 1 year, states that she is doing well from cardiac standpoint, she denies any chest pain or shortness of breath, however she continues to have recurrent falls for no obvious reason, this started approximately in May, she never loses consciousness, she is not sure if she trips over something or she just falls, there are no prodromal symptoms, no abnormal muscle motion involuntary incontinence.  Not related to orthostatic hypotension, she developed tinnitus around the same times and fullness in her ears and was evaluated by an ear specialist who did not find anything abnormal.  She continues to fall about 3 times a week, she went to ER on one occasion after she hit her arm and head.  She denies any chest pain palpitation or shortness of breath at the time of falls.  02/11/2019 - 4 months follow up, the patient presented to the hospital on January 17, 2019 with retrosternal chest pressure, which she  was diagnosed with non-STEMI high-sensitivity troponin was up to 23,000.  She underwent cardiac catheterization that showed that her non-STEMI is secondary to distal total occlusion of the obtuse marginal branch of the left circumflex coronary artery.  There was faint collateralization from the LAD.  Her LVEF was 55 to 60%, because her myocardial infarction was more than 48 hours when she presented her medical management was decided.  It was recommended that she be started on dual antiplatelet therapy smoking cessation and aggressive lipid-lowering management.  Today she states that she has been having no more chest pains however she is experiencing a lot of gastric pain and some GI doctor who started Protonix however is reluctant to perform upper or lower endoscopy secondary to recent myocardial infarction.   Past Medical History:  Diagnosis Date  . Acute respiratory failure with hypoxia s/p tracheostomy 07/2013  . Anemia    a. 7-08/2013 felt due to recent critical illness/chronic disease.  . Arthritis    handds, knees & back   . Barrett's esophagus   . CAD (coronary artery disease)    a. Mod dz 2010 initially mgd medically. b. 12/2012 - angina s/p PTCA/DES to mid-circumflex, PTCA/DES to first OM.   Marland Kitchen Carotid artery disease ([place])    a. 60-70% bilat ICA stenosis by dopplers 05/2012.  Marland Kitchen Chronic back pain    deteriorating;DDD  . Colonic obstruction due to suspected colitis; s/p colectomy/colostomy    a. See SBO.  Marland Kitchen Complication of anesthesia    hard to wake up from  anesthesia   . COPD (chronic obstructive pulmonary disease) ([place])   . Depression    takes Remeron and Zoloft daily  . Dizziness    was on Bentyl which caused this-took off of it and no problems since  . DVT of axillary vein, acute right ([place])    a. Dx 07/2013 felt due to R IJ central line that had been inserted 7/14. Not on anticoag due to GIB issues and small cerebral bleed.  Marland Kitchen GERD (gastroesophageal reflux disease)   . Heart  murmur   . History of bronchitis    > 16yrs ago   . History of colon polyps   . History of hiatal hernia   . History of kidney stones    was told it was stable but doesn't know for sure that she ever passed it  . HTN (hypertension)   . Hypertension    takes Metoprolol daily  . Intracranial hemorrhage ([place])    a. 08/11/13 - per DC summary, tiny SAH vs SDH done for altered mentation, anticoag stopped including aspirin.  . Joint pain   . Neck pain    DDD  . Other and unspecified hyperlipidemia    takes Lipitor daily  . Pneumonia    hx of->15 yrs ago  . Protein-calorie malnutrition, severe w/ electrolyte imbalance    a. Severe hypoalbuminemia leading to 3rd spacing including anasarca and pulm edema 07/2013.  Marland Kitchen SBO (small bowel obstruction) ([place])    a. Lysis of adhesions and ovarian cystectomy 04/2013 for sBO. b. Admitted 07/2013 for colonic obstruction due to suspected colitis, s/p partial colectomy/colostomy 08/01/13. Admission complicated by anasarca, acute resp failure requiring tracheostomy, decannulated 08/25/13. c. Recurrent SBO8/2015, NGT placed for decompression.   . Stroke [facility]) early 80's   right sided weakness--TIA    Past Surgical History:  Procedure Laterality Date  . ABDOMINAL HYSTERECTOMY    . APPENDECTOMY    . BOWEL RESECTION N/A 08/01/2013   Procedure: SMALL BOWEL RESECTION;  Surgeon: Harl Bowie, MD;  Location: [place];  Service: General;  Laterality: N/A;  . cataract surgery Bilateral   . CHOLECYSTECTOMY    . COLON RESECTION    . COLON SURGERY    . COLOSTOMY N/A 08/01/2013   Procedure: COLOSTOMY;  Surgeon: Harl Bowie, MD;  Location: [place];  Service: General;  Laterality: N/A;  . COLOSTOMY TAKEDOWN  01/24/2014   dr blackman  . COLOSTOMY TAKEDOWN N/A 01/24/2014   Procedure: COLOSTOMY TAKEDOWN;  Surgeon: Coralie Keens, MD;  Location: [place];  Service: General;  Laterality: N/A;  . CORONARY ANGIOPLASTY WITH STENT PLACEMENT  12/20/2012   STENT TO OM          DR COOPER  . ENDARTERECTOMY Right 08/08/2014   Procedure: RIGHT CAROTID ENDARTERECTOMY WITH HEMASHIELD PATCH ANGIOPLASTY;  Surgeon: Elam Dutch, MD;  Location: [place];  Service: Vascular;  Laterality: Right;  . FOOT SURGERY Left   . HAND SURGERY Bilateral    for ganglion cysts  . LAPAROTOMY N/A 08/01/2013   Procedure: EXPLORATORY LAPAROTOMY;  Surgeon: Harl Bowie, MD;  Location: [place];  Service: General;  Laterality: N/A;  . LEFT HEART CATH AND CORONARY ANGIOGRAPHY N/A 01/10/2019   Procedure: LEFT HEART CATH AND CORONARY ANGIOGRAPHY;  Surgeon: Troy Sine, MD;  Location: [place] CV LAB;  Service: Cardiovascular;  Laterality: N/A;  . LEFT HEART CATHETERIZATION WITH CORONARY ANGIOGRAM N/A 12/20/2012   Procedure: LEFT HEART CATHETERIZATION WITH CORONARY ANGIOGRAM;  Surgeon: Blane Ohara, MD;  Location: [place] CATH LAB;  Service: Cardiovascular;  Laterality: N/A;  . PARTIAL COLECTOMY N/A 08/01/2013   Procedure: PARTIAL COLECTOMY;  Surgeon: Harl Bowie, MD;  Location: [place];  Service: General;  Laterality: N/A;  . ROTATOR CUFF REPAIR Left   . TONSILLECTOMY    . TRACHEOSTOMY     feinstein    Current Medications: Current Meds  Medication Sig  . aspirin EC 81 MG tablet Take 162 mg by mouth daily.  Marland Kitchen atorvastatin (LIPITOR) 80 MG tablet Take 80 mg by mouth every evening.   Marland Kitchen buPROPion (WELLBUTRIN XL) 150 MG 24 hr tablet Take 1 tablet (150 mg total) by mouth daily.  Marland Kitchen docusate sodium (COLACE) 100 MG capsule Take 100 mg by mouth as needed for mild constipation.   . fish oil-omega-3 fatty acids 1000 MG capsule Take 1,000 mg by mouth daily.   . fluticasone (FLONASE) 50 MCG/ACT nasal spray Place 1 spray into both nostrils daily as needed for allergies.  . folic acid (FOLVITE) 885 MCG tablet Take 800 mcg by mouth daily.   . mirtazapine (REMERON) 45 MG tablet Take 45 mg by mouth at bedtime.   . nicotine (NICODERM CQ - DOSED IN MG/24 HR) 7 mg/24hr patch Place 1 patch (7 mg total)  onto the skin daily.  . nitroGLYCERIN (NITROSTAT) 0.4 MG SL tablet Place 1 tablet (0.4 mg total) under the tongue every 5 (five) minutes as needed for chest pain.  Marland Kitchen ondansetron (ZOFRAN) 4 MG tablet Take 4 mg by mouth every 8 (eight) hours as needed for nausea or vomiting.   . pantoprazole (PROTONIX) 40 MG tablet Take 40 mg by mouth daily.   . sertraline (ZOLOFT) 100 MG tablet Take 100 mg by mouth 2 (two) times daily.     Allergies:   Pregabalin and Sulfonamide derivatives   Social History   Socioeconomic History  . Marital status: Married    Spouse name: Not on file  . Number of children: Not on file  . Years of education: Not on file  . Highest education level: Not on file  Occupational History  . Occupation: full time  Tobacco Use  . Smoking status: Current Some Day Smoker    Packs/day: 0.25    Years: 53.00    Pack years: 13.25    Types: Cigarettes  . Smokeless tobacco: Never Used  . Tobacco comment: also uses e cig, 4 A DAY  Substance and Sexual Activity  . Alcohol use: No    Alcohol/week: 0.0 standard drinks  . Drug use: No  . Sexual activity: Not Currently    Birth control/protection: Surgical  Other Topics Concern  . Not on file  Social History Narrative  . Not on file   Social Determinants of Health   Financial Resource Strain:   . Difficulty of Paying Living Expenses: Not on file  Food Insecurity:   . Worried About Charity fundraiser in the Last Year: Not on file  . Ran Out of Food in the Last Year: Not on file  Transportation Needs:   . Lack of Transportation (Medical): Not on file  . Lack of Transportation (Non-Medical): Not on file  Physical Activity:   . Days of Exercise per Week: Not on file  . Minutes of Exercise per Session: Not on file  Stress:   . Feeling of Stress : Not on file  Social Connections:   . Frequency of Communication with Friends and Family: Not on file  . Frequency of Social Gatherings  with Friends and Family: Not on file  .  Attends Religious Services: Not on file  . Active Member of Clubs or Organizations: Not on file  . Attends Archivist Meetings: Not on file  . Marital Status: Not on file     Family History: The patient's family history includes AAA (abdominal aortic aneurysm) in her father; Heart attack in her father; Heart disease in her father and another family member; Varicose Veins in her mother.  ROS:   Please see the history of present illness.    All other systems reviewed and are negative.  EKGs/Labs/Other Studies Reviewed:    The following studies were reviewed today:  Carotid US: 08/16/2018 Right Carotid: Velocities in the right ICA are consistent with a 40-59% stenosis. Left Carotid: Velocities in the left ICA are consistent with a 40-59% stenosis.  EKG:  EKG is ordered today.  The ekg ordered today demonstrates sinus rhythm with PACs, otherwise normal EKG unchanged from prior.  Recent Labs: 01/08/2019: B Natriuretic Peptide 186.4; Magnesium 1.8 01/25/2019: ALT 12; BUN 12; Creatinine, Ser 0.95; Hemoglobin 11.9; Platelets 281; Potassium 4.2; Sodium 139; TSH 2.010   Recent Lipid Panel    Component Value Date/Time   CHOL 117 01/09/2019 0053   CHOL 150 01/30/2016 1052   TRIG 86 01/09/2019 0053   HDL 42 01/09/2019 0053   HDL 38 (L) 01/30/2016 1052   CHOLHDL 2.8 01/09/2019 0053   VLDL 17 01/09/2019 0053   LDLCALC 58 01/09/2019 0053   LDLCALC 84 01/30/2016 1052    Physical Exam:    VS:  BP 126/72   Pulse 82   Ht 5' (1.524 m)   Wt 170 lb (77.1 kg)   SpO2 99%   BMI 33.20 kg/m     Wt Readings from Last 3 Encounters:  02/11/19 170 lb (77.1 kg)  01/25/19 169 lb (76.7 kg)  01/11/19 169 lb 1.5 oz (76.7 kg)    GEN: Well nourished, well developed in no acute distress HEENT: Normal NECK: No JVD; No carotid bruits LYMPHATICS: No lymphadenopathy CARDIAC: RRR, no murmurs, rubs, gallops RESPIRATORY:  Clear to auscultation without rales, wheezing or rhonchi  ABDOMEN: Soft,  non-tender, non-distended MUSCULOSKELETAL:  No edema; No deformity  SKIN: Warm and dry NEUROLOGIC:  Alert and oriented x 3 PSYCHIATRIC:  Normal affect   LHC: 01/10/2019    Previously placed Mid Cx to Dist Cx stent (unknown type) is widely patent.  Mid Cx lesion is 30% stenosed.  2nd Mrg-1 lesion is 40% stenosed.  Previously placed 2nd Mrg-2 stent (unknown type) is widely patent.  2nd Mrg-3 lesion is 100% stenosed.  Prox RCA lesion is 30% stenosed.  The left ventricular systolic function is normal.  LV end diastolic pressure is mildly elevated.   Non-ST segment elevation myocardial infarction secondary to distal total occlusion of the obtuse marginal branch of the left circumflex coronary artery,  Normal left main, LAD and mild nonobstructive 30% narrowing in the RCA which supplies the posterior wall.  The left circumflex vessel has 40% ostial stenosis in the OM 2 vessel with a widely patent stent in the midportion of this marginal branch.  The distal marginal is 100% occluded.  There is faint collateralization from the LAD to the distal vessel which is small caliber; the AV groove circumflex has 30% narrowing in the stent in the AV groove circumflex is widely patent.  Serve global LV function with EF estimated 55%.  There is a subtle focal area of distal inferior hypocontractility.  RECOMMENDATION: The patient's ECG is entirely normal.  She is not having any recurrent chest pain.  Her MI was over 48 hours ago.  With her stability and distal occlusion recommend medical management.  Consider DAPT; however, will need to obtain additional information with her history of prior stroke following carotid endarterectomy remotely.  Alternatively aspirin alone and medical therapy for her current obstructive disease.  Smoking cessation is essential.  Aggressive lipid-lowering therapy with target LDL less than 70.    ASSESSMENT:    1. Non-ST elevation (NSTEMI) myocardial infarction  (HCC)   2. Pain of upper abdomen   3. Essential hypertension   4. Mixed hyperlipidemia   5. PAF (paroxysmal atrial fibrillation) (HCC)      PLAN:    In order of problems listed above:  1. CAD- stable -as above occluded distal portion of the obtuse marginal vessel with collaterals from LAD, aggressive medical management was recommended, she is tolerating high-dose of Lipitor and aspirin however not on dual antiplatelet therapy secondary to significant gastric pain and necessity of upper and lower endoscopy in the near future.  Continue metoprolol. I would approve for her to undergo EGD and colonoscopy rather earlier than later.  2. Chronic diastolic CHF -she is euvolemic off diuretics now.  3. Hypertension - controlled, continue current management.   4. Hyperlipidemia - on high dose of atorvastatin 80 mg PO QHS, normal LFTs. Tolerated well.  7. Carotid disease - s/p right carotid endarterectomy - complicated by stroke. Followed by Dr Oneida Alar. No bruit.  Most recent carotid ultrasound in July 2020 showed stable 40 to 60% stenosis bilaterally.  We will repeat in July 2021.  Medication Adjustments/Labs and Tests Ordered: Current medicines are reviewed at length with the patient today.  Concerns regarding medicines are outlined above.  No orders of the defined types were placed in this encounter.  No orders of the defined types were placed in this encounter.   Patient Instructions  Medication Instructions:   Your physician recommends that you continue on your current medications as directed. Please refer to the Current Medication list given to you today.  *If you need a refill on your cardiac medications before your next appointment, please call your pharmacy*    Follow-Up: At [facility], you and your health needs are our priority.  As part of our continuing mission to provide you with exceptional heart care, we have created designated Provider Care Teams.  These Care Teams  include your primary Cardiologist (physician) and Advanced Practice Providers (APPs -  Physician Assistants and Nurse Practitioners) who all work together to provide you with the care you need, when you need it.  Your next appointment:   6 month(s)  The format for your next appointment:   In Person  Provider:   Ena Dawley, MD       Signed, Ena Dawley, MD  02/11/2019 3:27 PM    [place]

## 2019-02-11 NOTE — Patient Instructions (Signed)
Medication Instructions:   Your physician recommends that you continue on your current medications as directed. Please refer to the Current Medication list given to you today.  *If you need a refill on your cardiac medications before your next appointment, please call your pharmacy*    Follow-Up: At [facility], you and your health needs are our priority.  As part of our continuing mission to provide you with exceptional heart care, we have created designated Provider Care Teams.  These Care Teams include your primary Cardiologist (physician) and Advanced Practice Providers (APPs -  Physician Assistants and Nurse Practitioners) who all work together to provide you with the care you need, when you need it.  Your next appointment:   6 month(s)  The format for your next appointment:   In Person  Provider:   Ena Dawley, MD

## 2019-02-14 ENCOUNTER — Ambulatory Visit: Payer: Medicare Other | Admitting: Cardiology

## 2019-02-14 ENCOUNTER — Telehealth: Payer: Self-pay

## 2019-02-14 NOTE — Telephone Encounter (Signed)
   [facility] Pre-operative Risk Assessment    Request for surgical clearance:  1. What type of surgery is being performed? Colonoscopy, endoscopy with possible dilation   2. When is this surgery scheduled? TBD   3. What type of clearance is required (medical clearance vs. Pharmacy clearance to hold med vs. Both)? Both  4. Are there any medications that need to be held prior to surgery and how long? Aspirin   5. Practice name and name of physician performing surgery? [facility]   6. What is your office phone number [phone]    7.   What is your office fax number [phone]  8.   Anesthesia type (None, local, MAC, general) ? Propofol   Mady Haagensen 02/14/2019, 3:38 PM  _________________________________________________________________   (provider comments below)

## 2019-02-15 DIAGNOSIS — Z86010 Personal history of colonic polyps: Secondary | ICD-10-CM | POA: Diagnosis not present

## 2019-02-15 DIAGNOSIS — K2270 Barrett's esophagus without dysplasia: Secondary | ICD-10-CM | POA: Diagnosis not present

## 2019-02-15 DIAGNOSIS — R1013 Epigastric pain: Secondary | ICD-10-CM | POA: Diagnosis not present

## 2019-02-15 NOTE — Telephone Encounter (Signed)
   Primary Cardiologist: Ena Dawley, MD  Chart reviewed as part of pre-operative protocol coverage. The patient has hx of CAD with recent MI in December with distal total occlusion of OM with faint collateralization form LAD. She has preserved EF. She is on aspirin alone due to GI issues. She also has a history of carotid disease complicated by stroke.   She was seen in our office on 02/11/2019 by Dr. Meda Coffee who approved for her to undergo EGD and colonoscopy earlier rather than later.   Regarding ASA therapy, we recommend continuation of ASA throughout the perioperative period.  However, if the surgeon feels that cessation of ASA is required, it may be stopped for the shortest period possible with a plan to resume it as soon as felt to be feasible from a surgical standpoint in the post-operative period.  I will route this recommendation to the requesting party via Epic fax function and remove from pre-op pool.  Please call with questions.  Daune Perch, NP 02/15/2019, 9:40 AM

## 2019-03-14 DIAGNOSIS — K2270 Barrett's esophagus without dysplasia: Secondary | ICD-10-CM | POA: Diagnosis not present

## 2019-03-14 DIAGNOSIS — K2930 Chronic superficial gastritis without bleeding: Secondary | ICD-10-CM | POA: Diagnosis not present

## 2019-03-14 DIAGNOSIS — K219 Gastro-esophageal reflux disease without esophagitis: Secondary | ICD-10-CM | POA: Diagnosis not present

## 2019-03-14 DIAGNOSIS — D122 Benign neoplasm of ascending colon: Secondary | ICD-10-CM | POA: Diagnosis not present

## 2019-03-14 DIAGNOSIS — Z791 Long term (current) use of non-steroidal anti-inflammatories (NSAID): Secondary | ICD-10-CM | POA: Diagnosis not present

## 2019-03-14 DIAGNOSIS — K635 Polyp of colon: Secondary | ICD-10-CM | POA: Diagnosis not present

## 2019-03-14 DIAGNOSIS — Z86010 Personal history of colonic polyps: Secondary | ICD-10-CM | POA: Diagnosis not present

## 2019-03-14 DIAGNOSIS — K3189 Other diseases of stomach and duodenum: Secondary | ICD-10-CM | POA: Diagnosis not present

## 2019-03-16 DIAGNOSIS — Z1231 Encounter for screening mammogram for malignant neoplasm of breast: Secondary | ICD-10-CM | POA: Diagnosis not present

## 2019-03-16 DIAGNOSIS — Z0000 Encounter for general adult medical examination without abnormal findings: Secondary | ICD-10-CM | POA: Diagnosis not present

## 2019-03-16 DIAGNOSIS — E785 Hyperlipidemia, unspecified: Secondary | ICD-10-CM | POA: Diagnosis not present

## 2019-03-16 DIAGNOSIS — Z136 Encounter for screening for cardiovascular disorders: Secondary | ICD-10-CM | POA: Diagnosis not present

## 2019-03-16 DIAGNOSIS — Z9181 History of falling: Secondary | ICD-10-CM | POA: Diagnosis not present

## 2019-03-21 DIAGNOSIS — I10 Essential (primary) hypertension: Secondary | ICD-10-CM | POA: Diagnosis not present

## 2019-03-21 DIAGNOSIS — M1990 Unspecified osteoarthritis, unspecified site: Secondary | ICD-10-CM | POA: Diagnosis not present

## 2019-03-21 DIAGNOSIS — D539 Nutritional anemia, unspecified: Secondary | ICD-10-CM | POA: Diagnosis not present

## 2019-03-21 DIAGNOSIS — E785 Hyperlipidemia, unspecified: Secondary | ICD-10-CM | POA: Diagnosis not present

## 2019-03-21 DIAGNOSIS — R739 Hyperglycemia, unspecified: Secondary | ICD-10-CM | POA: Diagnosis not present

## 2019-03-31 ENCOUNTER — Other Ambulatory Visit: Payer: Self-pay | Admitting: Cardiology

## 2019-04-19 ENCOUNTER — Ambulatory Visit: Payer: Medicare Other | Admitting: Cardiology

## 2019-04-20 ENCOUNTER — Ambulatory Visit: Payer: Medicare Other | Admitting: Student

## 2019-04-20 ENCOUNTER — Other Ambulatory Visit: Payer: Self-pay

## 2019-04-20 ENCOUNTER — Encounter: Payer: Self-pay | Admitting: Student

## 2019-04-20 VITALS — BP 152/84 | HR 88 | Ht 60.0 in | Wt 167.4 lb

## 2019-04-20 DIAGNOSIS — I10 Essential (primary) hypertension: Secondary | ICD-10-CM

## 2019-04-20 DIAGNOSIS — I472 Ventricular tachycardia, unspecified: Secondary | ICD-10-CM

## 2019-04-20 DIAGNOSIS — I471 Supraventricular tachycardia, unspecified: Secondary | ICD-10-CM

## 2019-04-20 DIAGNOSIS — I2510 Atherosclerotic heart disease of native coronary artery without angina pectoris: Secondary | ICD-10-CM

## 2019-04-20 NOTE — Progress Notes (Signed)
PCP:  [name], MD Primary Cardiologist: Ena Dawley, MD Electrophysiologist: Dr. [name] is a 79 y.o. female with past medical history of HLD, HTN, PAD, tobacco abuse, and CAD who presents today for routine electrophysiology followup. They are seen for Dr. Curt Bears.   Since last being seen in our clinic, the patient she did have an admission for chest pain. Gray 12/2019 showed likely an old MI/NSTMI thought to be secondary to distal total occlusion of the obtuse marginal branch of the LCx.   She is doing OK today. She has been having symptoms of a cold for 4 days, and mid back pain with standing for approximately one month. She has very occasional palpitations. She denies exertional chest pain, syncope, or near syncope. She does get lightheaded with rapid standing, especially in the setting of her back pain. She has not followed with her PCP for this.   The patient feels that she is tolerating medications without difficulties and is otherwise without complaint today.   Past Medical History:  Diagnosis Date  . Acute respiratory failure with hypoxia s/p tracheostomy 07/2013  . Anemia    a. 7-08/2013 felt due to recent critical illness/chronic disease.  . Arthritis    handds, knees & back   . Barrett's esophagus   . CAD (coronary artery disease)    a. Mod dz 2010 initially mgd medically. b. 12/2012 - angina s/p PTCA/DES to mid-circumflex, PTCA/DES to first OM.   Marland Kitchen Carotid artery disease ([place])    a. 60-70% bilat ICA stenosis by dopplers 05/2012.  Marland Kitchen Chronic back pain    deteriorating;DDD  . Colonic obstruction due to suspected colitis; s/p colectomy/colostomy    a. See SBO.  Marland Kitchen Complication of anesthesia    hard to wake up from anesthesia   . COPD (chronic obstructive pulmonary disease) ([place])   . Depression    takes Remeron and Zoloft daily  . Dizziness    was on Bentyl which caused this-took off of it and no problems since  . DVT of axillary vein, acute  right ([place])    a. Dx 07/2013 felt due to R IJ central line that had been inserted 7/14. Not on anticoag due to GIB issues and small cerebral bleed.  Marland Kitchen GERD (gastroesophageal reflux disease)   . Heart murmur   . History of bronchitis    > 94yrs ago   . History of colon polyps   . History of hiatal hernia   . History of kidney stones    was told it was stable but doesn't know for sure that she ever passed it  . HTN (hypertension)   . Hypertension    takes Metoprolol daily  . Intracranial hemorrhage ([place])    a. 08/11/13 - per DC summary, tiny SAH vs SDH done for altered mentation, anticoag stopped including aspirin.  . Joint pain   . Neck pain    DDD  . Other and unspecified hyperlipidemia    takes Lipitor daily  . Pneumonia    hx of->15 yrs ago  . Protein-calorie malnutrition, severe w/ electrolyte imbalance    a. Severe hypoalbuminemia leading to 3rd spacing including anasarca and pulm edema 07/2013.  Marland Kitchen SBO (small bowel obstruction) ([place])    a. Lysis of adhesions and ovarian cystectomy 04/2013 for sBO. b. Admitted 07/2013 for colonic obstruction due to suspected colitis, s/p partial colectomy/colostomy 08/01/13. Admission complicated by anasarca, acute resp failure requiring tracheostomy, decannulated 08/25/13. c. Recurrent SBO8/2015, NGT placed  for decompression.   . Stroke [place]) early 80's   right sided weakness--TIA   Past Surgical History:  Procedure Laterality Date  . ABDOMINAL HYSTERECTOMY    . APPENDECTOMY    . BOWEL RESECTION N/A 08/01/2013   Procedure: SMALL BOWEL RESECTION;  Surgeon: Harl Bowie, MD;  Location: [facility];  Service: General;  Laterality: N/A;  . cataract surgery Bilateral   . CHOLECYSTECTOMY    . COLON RESECTION    . COLON SURGERY    . COLOSTOMY N/A 08/01/2013   Procedure: COLOSTOMY;  Surgeon: Harl Bowie, MD;  Location: [facility];  Service: General;  Laterality: N/A;  . COLOSTOMY TAKEDOWN  01/24/2014   dr blackman  . COLOSTOMY TAKEDOWN N/A 01/24/2014    Procedure: COLOSTOMY TAKEDOWN;  Surgeon: Coralie Keens, MD;  Location: [facility];  Service: General;  Laterality: N/A;  . CORONARY ANGIOPLASTY WITH STENT PLACEMENT  12/20/2012   STENT TO OM         DR COOPER  . ENDARTERECTOMY Right 08/08/2014   Procedure: RIGHT CAROTID ENDARTERECTOMY WITH HEMASHIELD PATCH ANGIOPLASTY;  Surgeon: Elam Dutch, MD;  Location: [facility];  Service: Vascular;  Laterality: Right;  . FOOT SURGERY Left   . HAND SURGERY Bilateral    for ganglion cysts  . LAPAROTOMY N/A 08/01/2013   Procedure: EXPLORATORY LAPAROTOMY;  Surgeon: Harl Bowie, MD;  Location: [facility];  Service: General;  Laterality: N/A;  . LEFT HEART CATH AND CORONARY ANGIOGRAPHY N/A 01/10/2019   Procedure: LEFT HEART CATH AND CORONARY ANGIOGRAPHY;  Surgeon: Troy Sine, MD;  Location: [facility];  Service: Cardiovascular;  Laterality: N/A;  . LEFT HEART CATHETERIZATION WITH CORONARY ANGIOGRAM N/A 12/20/2012   Procedure: LEFT HEART CATHETERIZATION WITH CORONARY ANGIOGRAM;  Surgeon: Blane Ohara, MD;  Location: [facility] CATH [facility];  Service: Cardiovascular;  Laterality: N/A;  . PARTIAL COLECTOMY N/A 08/01/2013   Procedure: PARTIAL COLECTOMY;  Surgeon: Harl Bowie, MD;  Location: [facility];  Service: General;  Laterality: N/A;  . ROTATOR CUFF REPAIR Left   . TONSILLECTOMY    . TRACHEOSTOMY     feinstein    Current Outpatient Medications  Medication Sig Dispense Refill  . aspirin EC 81 MG tablet Take 162 mg by mouth daily.    Marland Kitchen atorvastatin (LIPITOR) 80 MG tablet Take 80 mg by mouth every evening.     Marland Kitchen buPROPion (WELLBUTRIN XL) 150 MG 24 hr tablet Take 1 tablet (150 mg total) by mouth daily. 90 tablet 3  . docusate sodium (COLACE) 100 MG capsule Take 100 mg by mouth as needed for mild constipation.     . fish oil-omega-3 fatty acids 1000 MG capsule Take 1,000 mg by mouth daily.     . fluticasone (FLONASE) 50 MCG/ACT nasal spray Place 1 spray into both nostrils daily as needed for allergies.      . folic acid (FOLVITE) 811 MCG tablet Take 800 mcg by mouth daily.     . metoprolol succinate (TOPROL-XL) 50 MG 24 hr tablet Take 1 tablet (50 mg total) by mouth daily. 90 tablet 2  . mirtazapine (REMERON) 45 MG tablet Take 45 mg by mouth at bedtime.     . nicotine (NICODERM CQ - DOSED IN MG/24 HR) 7 mg/24hr patch Place 1 patch (7 mg total) onto the skin daily. 28 patch 3  . nitroGLYCERIN (NITROSTAT) 0.4 MG SL tablet Place 1 tablet (0.4 mg total) under the tongue every 5 (five) minutes as needed for chest pain.  25 tablet 4  . ondansetron (ZOFRAN) 4 MG tablet Take 4 mg by mouth every 8 (eight) hours as needed for nausea or vomiting.     Marland Kitchen oxyCODONE (OXY IR/ROXICODONE) 5 MG immediate release tablet Take 5 mg by mouth as needed.    . pantoprazole (PROTONIX) 40 MG tablet Take 40 mg by mouth daily.     . sertraline (ZOLOFT) 100 MG tablet Take 100 mg by mouth 2 (two) times daily.     No current facility-administered medications for this visit.    Allergies  Allergen Reactions  . Pregabalin Swelling    Tongue swelling  . Sulfonamide Derivatives Swelling    Childhood reaction    Social History   Socioeconomic History  . Marital status: Married    Spouse name: Not on file  . Number of children: Not on file  . Years of education: Not on file  . Highest education level: Not on file  Occupational History  . Occupation: full time  Tobacco Use  . Smoking status: Current Some Day Smoker    Packs/day: 0.25    Years: 53.00    Pack years: 13.25    Types: Cigarettes  . Smokeless tobacco: Never Used  . Tobacco comment: also uses e cig, 4 A DAY  Substance and Sexual Activity  . Alcohol use: No    Alcohol/week: 0.0 standard drinks  . Drug use: No  . Sexual activity: Not Currently    Birth control/protection: Surgical  Other Topics Concern  . Not on file  Social History Narrative  . Not on file   Social Determinants of Health   Financial Resource Strain:   . Difficulty of Paying  Living Expenses:   Food Insecurity:   . Worried About Charity fundraiser in the Last Year:   . Arboriculturist in the Last Year:   Transportation Needs:   . Film/video editor (Medical):   Marland Kitchen Lack of Transportation (Non-Medical):   Physical Activity:   . Days of Exercise per Week:   . Minutes of Exercise per Session:   Stress:   . Feeling of Stress :   Social Connections:   . Frequency of Communication with Friends and Family:   . Frequency of Social Gatherings with Friends and Family:   . Attends Religious Services:   . Active Member of Clubs or Organizations:   . Attends Archivist Meetings:   Marland Kitchen Marital Status:   Intimate Partner Violence:   . Fear of Current or Ex-Partner:   . Emotionally Abused:   Marland Kitchen Physically Abused:   . Sexually Abused:      Review of Systems: General: No chills, fever, night sweats or weight changes  Cardiovascular:  No chest pain, dyspnea on exertion, edema, orthopnea, palpitations, paroxysmal nocturnal dyspnea Dermatological: No rash, lesions or masses Respiratory: No cough, dyspnea Urologic: No hematuria, dysuria Abdominal: No nausea, vomiting, diarrhea, bright red blood per rectum, melena, or hematemesis Neurologic: No visual changes, weakness, changes in mental status All other systems reviewed and are otherwise negative except as noted above.  Physical Exam: Vitals:   04/20/19 1138  BP: (!) 152/84  Pulse: 88  SpO2: 99%  Weight: 167 lb 6.4 oz (75.9 kg)  Height: 5' (1.524 m)    GEN- The patient is well appearing, alert and oriented x 3 today.   HEENT: normocephalic, atraumatic; sclera clear, conjunctiva pink; hearing intact; oropharynx clear; neck supple, no JVP Lymph- no cervical lymphadenopathy Lungs- Clear to ausculation bilaterally,  normal work of breathing.  No wheezes, rales, rhonchi Heart- Regular rate and rhythm, no murmurs, rubs or gallops, PMI not laterally displaced GI- soft, non-tender, non-distended, bowel  sounds present, no hepatosplenomegaly Extremities- no clubbing, cyanosis, or edema; DP/PT/radial pulses 2+ bilaterally MS- no significant deformity or atrophy Skin- warm and dry, no rash or lesion Psych- euthymic mood, full affect Neuro- strength and sensation are intact  EKG is not ordered. Personal review of EKG from 01/10/2019 showed NSR at 68 bpm, normal intervals  Assessment and Plan:  1. VT/NSVT Previously noted on Zio monitor. Continue Toprol 50 mg daily Echo 12/2018 LVEF 60-65%  2. HTN Currently well controlled  3. CAD Denies ischemic symptoms. Managed closely by Dr. Meda Coffee  4. SVT Continue Toprol-XL. As discussed previously she would like to avoid ablation.   5. Mid back pain New, related to specific motion of standing up or rolling over in bed. This is not related to exertion or similar at all to her previous anginal equivalent of central chest pain.  Encouraged rest, Tylenol, and PCP follow up.   Shirley Friar, PA-C  04/20/19 11:50 AM

## 2019-04-20 NOTE — Patient Instructions (Signed)
Medication Instructions:  none *If you need a refill on your cardiac medications before your next appointment, please call your pharmacy*   Lab Work: none If you have labs (blood work) drawn today and your tests are completely normal, you will receive your results only by: Marland Kitchen MyChart Message (if you have MyChart) OR . A paper copy in the mail If you have any lab test that is abnormal or we need to change your treatment, we will call you to review the results.   Testing/Procedures: none   Follow-Up: At [facility], you and your health needs are our priority.  As part of our continuing mission to provide you with exceptional heart care, we have created designated Provider Care Teams.  These Care Teams include your primary Cardiologist (physician) and Advanced Practice Providers (APPs -  Physician Assistants and Nurse Practitioners) who all work together to provide you with the care you need, when you need it.      Your next appointment:   6 month(s)  The format for your next appointment:   Either In Person or Virtual  Provider:   Dr Curt Bears   Other Instructions

## 2019-05-09 DIAGNOSIS — Z1231 Encounter for screening mammogram for malignant neoplasm of breast: Secondary | ICD-10-CM | POA: Diagnosis not present

## 2019-05-11 DIAGNOSIS — M546 Pain in thoracic spine: Secondary | ICD-10-CM | POA: Diagnosis not present

## 2019-05-18 DIAGNOSIS — M5124 Other intervertebral disc displacement, thoracic region: Secondary | ICD-10-CM | POA: Diagnosis not present

## 2019-05-18 DIAGNOSIS — J90 Pleural effusion, not elsewhere classified: Secondary | ICD-10-CM | POA: Diagnosis not present

## 2019-05-26 DIAGNOSIS — M546 Pain in thoracic spine: Secondary | ICD-10-CM | POA: Diagnosis not present

## 2019-06-02 DIAGNOSIS — M546 Pain in thoracic spine: Secondary | ICD-10-CM | POA: Diagnosis not present

## 2019-06-02 DIAGNOSIS — R2689 Other abnormalities of gait and mobility: Secondary | ICD-10-CM | POA: Diagnosis not present

## 2019-06-02 DIAGNOSIS — M5489 Other dorsalgia: Secondary | ICD-10-CM | POA: Diagnosis not present

## 2019-06-03 DIAGNOSIS — M546 Pain in thoracic spine: Secondary | ICD-10-CM | POA: Diagnosis not present

## 2019-06-03 DIAGNOSIS — R2689 Other abnormalities of gait and mobility: Secondary | ICD-10-CM | POA: Diagnosis not present

## 2019-06-03 DIAGNOSIS — G8929 Other chronic pain: Secondary | ICD-10-CM | POA: Diagnosis not present

## 2019-06-03 DIAGNOSIS — M5489 Other dorsalgia: Secondary | ICD-10-CM | POA: Diagnosis not present

## 2019-06-04 DIAGNOSIS — M5489 Other dorsalgia: Secondary | ICD-10-CM | POA: Diagnosis not present

## 2019-06-04 DIAGNOSIS — M546 Pain in thoracic spine: Secondary | ICD-10-CM | POA: Diagnosis not present

## 2019-06-04 DIAGNOSIS — R2689 Other abnormalities of gait and mobility: Secondary | ICD-10-CM | POA: Diagnosis not present

## 2019-06-04 DIAGNOSIS — G8929 Other chronic pain: Secondary | ICD-10-CM | POA: Diagnosis not present

## 2019-06-16 DIAGNOSIS — R109 Unspecified abdominal pain: Secondary | ICD-10-CM | POA: Diagnosis not present

## 2019-06-16 DIAGNOSIS — N390 Urinary tract infection, site not specified: Secondary | ICD-10-CM | POA: Diagnosis not present

## 2019-06-16 DIAGNOSIS — A499 Bacterial infection, unspecified: Secondary | ICD-10-CM | POA: Diagnosis not present

## 2019-06-17 DIAGNOSIS — R109 Unspecified abdominal pain: Secondary | ICD-10-CM | POA: Diagnosis not present

## 2019-06-27 DIAGNOSIS — M5489 Other dorsalgia: Secondary | ICD-10-CM | POA: Diagnosis not present

## 2019-06-27 DIAGNOSIS — M546 Pain in thoracic spine: Secondary | ICD-10-CM | POA: Diagnosis not present

## 2019-06-27 DIAGNOSIS — R2689 Other abnormalities of gait and mobility: Secondary | ICD-10-CM | POA: Diagnosis not present

## 2019-07-04 DIAGNOSIS — M546 Pain in thoracic spine: Secondary | ICD-10-CM | POA: Diagnosis not present

## 2019-07-04 DIAGNOSIS — R2689 Other abnormalities of gait and mobility: Secondary | ICD-10-CM | POA: Diagnosis not present

## 2019-07-04 DIAGNOSIS — G8929 Other chronic pain: Secondary | ICD-10-CM | POA: Diagnosis not present

## 2019-07-04 DIAGNOSIS — M5489 Other dorsalgia: Secondary | ICD-10-CM | POA: Diagnosis not present

## 2019-07-06 ENCOUNTER — Telehealth: Payer: Self-pay | Admitting: Cardiology

## 2019-07-06 NOTE — Telephone Encounter (Signed)
Pt states someone from our office called her on this past Monday.  She is unsure who called and what it was in regards too.  Informed the pt that I do not see any encounters where anyone from our office called her on Monday and left her a message to call back. She has had no recent test done by our office.  Informed the pt that I do not see where any Provider's office tried calling her, that would be in the [place]. Pt verbalized understanding and states she is sure they will call her back if its something important.  Pt confirmed she has had no recent test or needed to schedule any test and/or appts with our office.  Pt states she will await for someone to try to call her back if needed.  Pt gracious for all the assistance provided.

## 2019-07-06 NOTE — Telephone Encounter (Signed)
Left the pt a message to call back, to try and assist her with finding out who called her on this past Monday.  I do not see where our office tried to call her on 6/14.  No notes in her chart or in appt desk indicating our office called on 6/14. Will await for pt to return a call back.

## 2019-07-06 NOTE — Telephone Encounter (Signed)
Follow Up:     Pt said she was returning a call from Monday, she did not know who called her.

## 2019-07-06 NOTE — Telephone Encounter (Signed)
Transferred patient to nurse Ivy.

## 2019-08-03 DIAGNOSIS — R2689 Other abnormalities of gait and mobility: Secondary | ICD-10-CM | POA: Diagnosis not present

## 2019-08-03 DIAGNOSIS — M546 Pain in thoracic spine: Secondary | ICD-10-CM | POA: Diagnosis not present

## 2019-08-03 DIAGNOSIS — M5489 Other dorsalgia: Secondary | ICD-10-CM | POA: Diagnosis not present

## 2019-08-03 DIAGNOSIS — G8929 Other chronic pain: Secondary | ICD-10-CM | POA: Diagnosis not present

## 2019-08-05 DIAGNOSIS — E785 Hyperlipidemia, unspecified: Secondary | ICD-10-CM | POA: Diagnosis not present

## 2019-08-05 DIAGNOSIS — I10 Essential (primary) hypertension: Secondary | ICD-10-CM | POA: Diagnosis not present

## 2019-08-05 DIAGNOSIS — M1990 Unspecified osteoarthritis, unspecified site: Secondary | ICD-10-CM | POA: Diagnosis not present

## 2019-08-05 DIAGNOSIS — R739 Hyperglycemia, unspecified: Secondary | ICD-10-CM | POA: Diagnosis not present

## 2019-08-05 DIAGNOSIS — D539 Nutritional anemia, unspecified: Secondary | ICD-10-CM | POA: Diagnosis not present

## 2019-08-05 DIAGNOSIS — Z79899 Other long term (current) drug therapy: Secondary | ICD-10-CM | POA: Diagnosis not present

## 2019-09-03 DIAGNOSIS — M5489 Other dorsalgia: Secondary | ICD-10-CM | POA: Diagnosis not present

## 2019-09-03 DIAGNOSIS — G8929 Other chronic pain: Secondary | ICD-10-CM | POA: Diagnosis not present

## 2019-09-03 DIAGNOSIS — M546 Pain in thoracic spine: Secondary | ICD-10-CM | POA: Diagnosis not present

## 2019-09-03 DIAGNOSIS — R2689 Other abnormalities of gait and mobility: Secondary | ICD-10-CM | POA: Diagnosis not present

## 2019-09-20 ENCOUNTER — Other Ambulatory Visit: Payer: Self-pay

## 2019-09-20 DIAGNOSIS — I6523 Occlusion and stenosis of bilateral carotid arteries: Secondary | ICD-10-CM

## 2019-09-27 ENCOUNTER — Other Ambulatory Visit: Payer: Self-pay

## 2019-09-27 ENCOUNTER — Encounter: Payer: Self-pay | Admitting: Cardiology

## 2019-09-27 ENCOUNTER — Ambulatory Visit (INDEPENDENT_AMBULATORY_CARE_PROVIDER_SITE_OTHER): Payer: Medicare Other | Admitting: Cardiology

## 2019-09-27 VITALS — BP 122/74 | HR 112 | Ht 59.0 in | Wt 162.2 lb

## 2019-09-27 DIAGNOSIS — I2510 Atherosclerotic heart disease of native coronary artery without angina pectoris: Secondary | ICD-10-CM

## 2019-09-27 DIAGNOSIS — I6523 Occlusion and stenosis of bilateral carotid arteries: Secondary | ICD-10-CM

## 2019-09-27 DIAGNOSIS — E782 Mixed hyperlipidemia: Secondary | ICD-10-CM

## 2019-09-27 DIAGNOSIS — F17200 Nicotine dependence, unspecified, uncomplicated: Secondary | ICD-10-CM | POA: Diagnosis not present

## 2019-09-27 DIAGNOSIS — I10 Essential (primary) hypertension: Secondary | ICD-10-CM

## 2019-09-27 NOTE — Patient Instructions (Signed)

## 2019-09-27 NOTE — Progress Notes (Signed)
Cardiology Office Note:    Date:  09/27/2019   ID:  [name], DOB Feb 19, 1940, MRN [id number]  PCP:  [name], MD  Cardiologist:  Ena Dawley, MD  Electrophysiologist:  Constance Haw, MD   Referring MD: [name], MD   Reason for visit: 8 months follow-up  History of Present Illness:    [name] is a 79 y.o. female with a hx of hyperlipidemia, hypertension, PAD and CAD who returns today for a 1 year follow up. The patient is a lifelong smoker. In November 2014 she underwent a cardiac cath on 12/20/2012 with finding of severe stenosis in OM1 and received 2 DES. In May 2015 had a SBO and required partial colon resection.  She underwent right carotid endarterectomy on 67/34/1937 that was complicated by postop stroke in the recovery room. This resulted in a left hemiplegia. Seizures with heart She was hospitalized in December 2020 with retrosternal chest pressure, which she was diagnosed with non-STEMI high-sensitivity troponin was up to 23,000.  She underwent cardiac catheterization that showed that her non-STEMI is secondary to distal total occlusion of the obtuse marginal branch of the left circumflex coronary artery.  There was faint collateralization from the LAD.  Her LVEF was 55 to 60%, because her myocardial infarction was more than 48 hours when she presented her medical management was decided. It was recommended that she be started on dual antiplatelet therapy smoking cessation and aggressive lipid-lowering management.  Today she comes after 8 months, she is doing well, denies any chest pain or shortness of breasted worse than previously, she continues to smoke, she has been compliant with her medications and has no side effects.  She tolerates high-dose of atorvastatin well.  She denies any orthostatic hypotension, no lower extremity edema.  She has no palpitations.  She had an episode of fall last week when she slipped on a wet floor at  home.  She did not hit her head.  Past Medical History:  Diagnosis Date  . Acute respiratory failure with hypoxia s/p tracheostomy 07/2013  . Anemia    a. 7-08/2013 felt due to recent critical illness/chronic disease.  . Arthritis    handds, knees & back   . Barrett's esophagus   . CAD (coronary artery disease)    a. Mod dz 2010 initially mgd medically. b. 12/2012 - angina s/p PTCA/DES to mid-circumflex, PTCA/DES to first OM.   Marland Kitchen Carotid artery disease ([place])    a. 60-70% bilat ICA stenosis by dopplers 05/2012.  Marland Kitchen Chronic back pain    deteriorating;DDD  . Colonic obstruction due to suspected colitis; s/p colectomy/colostomy    a. See SBO.  Marland Kitchen Complication of anesthesia    hard to wake up from anesthesia   . COPD (chronic obstructive pulmonary disease) ([place])   . Depression    takes Remeron and Zoloft daily  . Dizziness    was on Bentyl which caused this-took off of it and no problems since  . DVT of axillary vein, acute right ([place])    a. Dx 07/2013 felt due to R IJ central line that had been inserted 7/14. Not on anticoag due to GIB issues and small cerebral bleed.  Marland Kitchen GERD (gastroesophageal reflux disease)   . Heart murmur   . History of bronchitis    > 47yrs ago   . History of colon polyps   . History of hiatal hernia   . History of kidney stones    was told it  was stable but doesn't know for sure that she ever passed it  . HTN (hypertension)   . Hypertension    takes Metoprolol daily  . Intracranial hemorrhage ([place])    a. 08/11/13 - per DC summary, tiny SAH vs SDH done for altered mentation, anticoag stopped including aspirin.  . Joint pain   . Neck pain    DDD  . Other and unspecified hyperlipidemia    takes Lipitor daily  . Pneumonia    hx of->15 yrs ago  . Protein-calorie malnutrition, severe w/ electrolyte imbalance    a. Severe hypoalbuminemia leading to 3rd spacing including anasarca and pulm edema 07/2013.  Marland Kitchen SBO (small bowel obstruction) ([place])    a. Lysis of  adhesions and ovarian cystectomy 04/2013 for sBO. b. Admitted 07/2013 for colonic obstruction due to suspected colitis, s/p partial colectomy/colostomy 08/01/13. Admission complicated by anasarca, acute resp failure requiring tracheostomy, decannulated 08/25/13. c. Recurrent SBO8/2015, NGT placed for decompression.   . Stroke [facility]) early 80's   right sided weakness--TIA    Past Surgical History:  Procedure Laterality Date  . ABDOMINAL HYSTERECTOMY    . APPENDECTOMY    . BOWEL RESECTION N/A 08/01/2013   Procedure: SMALL BOWEL RESECTION;  Surgeon: Harl Bowie, MD;  Location: [place];  Service: General;  Laterality: N/A;  . cataract surgery Bilateral   . CHOLECYSTECTOMY    . COLON RESECTION    . COLON SURGERY    . COLOSTOMY N/A 08/01/2013   Procedure: COLOSTOMY;  Surgeon: Harl Bowie, MD;  Location: [place];  Service: General;  Laterality: N/A;  . COLOSTOMY TAKEDOWN  01/24/2014   dr blackman  . COLOSTOMY TAKEDOWN N/A 01/24/2014   Procedure: COLOSTOMY TAKEDOWN;  Surgeon: Coralie Keens, MD;  Location: [place];  Service: General;  Laterality: N/A;  . CORONARY ANGIOPLASTY WITH STENT PLACEMENT  12/20/2012   STENT TO OM         DR COOPER  . ENDARTERECTOMY Right 08/08/2014   Procedure: RIGHT CAROTID ENDARTERECTOMY WITH HEMASHIELD PATCH ANGIOPLASTY;  Surgeon: Elam Dutch, MD;  Location: [place];  Service: Vascular;  Laterality: Right;  . FOOT SURGERY Left   . HAND SURGERY Bilateral    for ganglion cysts  . LAPAROTOMY N/A 08/01/2013   Procedure: EXPLORATORY LAPAROTOMY;  Surgeon: Harl Bowie, MD;  Location: [place];  Service: General;  Laterality: N/A;  . LEFT HEART CATH AND CORONARY ANGIOGRAPHY N/A 01/10/2019   Procedure: LEFT HEART CATH AND CORONARY ANGIOGRAPHY;  Surgeon: Troy Sine, MD;  Location: [place] [facility];  Service: Cardiovascular;  Laterality: N/A;  . LEFT HEART CATHETERIZATION WITH CORONARY ANGIOGRAM N/A 12/20/2012   Procedure: LEFT HEART CATHETERIZATION WITH CORONARY  ANGIOGRAM;  Surgeon: Blane Ohara, MD;  Location: [facility];  Service: Cardiovascular;  Laterality: N/A;  . PARTIAL COLECTOMY N/A 08/01/2013   Procedure: PARTIAL COLECTOMY;  Surgeon: Harl Bowie, MD;  Location: [place];  Service: General;  Laterality: N/A;  . ROTATOR CUFF REPAIR Left   . TONSILLECTOMY    . TRACHEOSTOMY     fe[place]    Current Medications: Current Meds  Medication Sig  . aspirin EC 81 MG tablet Take 162 mg by mouth daily.  Marland Kitchen atorvastatin (LIPITOR) 80 MG tablet Take 80 mg by mouth every evening.   Marland Kitchen buPROPion (WELLBUTRIN XL) 150 MG 24 hr tablet Take 1 tablet (150 mg total) by mouth daily.  Marland Kitchen docusate sodium (COLACE) 100 MG capsule Take 100 mg by mouth as needed  for mild constipation.   . fish oil-omega-3 fatty acids 1000 MG capsule Take 1,000 mg by mouth daily.   . fluticasone (FLONASE) 50 MCG/ACT nasal spray Place 1 spray into both nostrils daily as needed for allergies.  . folic acid (FOLVITE) 740 MCG tablet Take 800 mcg by mouth daily.   . metoprolol succinate (TOPROL-XL) 50 MG 24 hr tablet Take 1 tablet (50 mg total) by mouth daily.  . mirtazapine (REMERON) 45 MG tablet Take 45 mg by mouth at bedtime.   . nicotine (NICODERM CQ - DOSED IN MG/24 HR) 7 mg/24hr patch Place 1 patch (7 mg total) onto the skin daily.  . nitroGLYCERIN (NITROSTAT) 0.4 MG SL tablet Place 1 tablet (0.4 mg total) under the tongue every 5 (five) minutes as needed for chest pain.  Marland Kitchen ondansetron (ZOFRAN) 4 MG tablet Take 4 mg by mouth every 8 (eight) hours as needed for nausea or vomiting.   Marland Kitchen oxyCODONE (OXY IR/ROXICODONE) 5 MG immediate release tablet Take 5 mg by mouth as needed.  . sertraline (ZOLOFT) 100 MG tablet Take 100 mg by mouth 2 (two) times daily.    Allergies:   Pregabalin and Sulfonamide derivatives   Social History   Socioeconomic History  . Marital status: Married    Spouse name: Not on file  . Number of children: Not on file  . Years of education: Not on file  .  Highest education level: Not on file  Occupational History  . Occupation: full time  Tobacco Use  . Smoking status: Current Some Day Smoker    Packs/day: 0.25    Years: 53.00    Pack years: 13.25    Types: Cigarettes  . Smokeless tobacco: Never Used  . Tobacco comment: also uses e cig, 4 A DAY  Vaping Use  . Vaping Use: Never used  Substance and Sexual Activity  . Alcohol use: No    Alcohol/week: 0.0 standard drinks  . Drug use: No  . Sexual activity: Not Currently    Birth control/protection: Surgical  Other Topics Concern  . Not on file  Social History Narrative  . Not on file   Social Determinants of Health   Financial Resource Strain:   . Difficulty of Paying Living Expenses: Not on file  Food Insecurity:   . Worried About Charity fundraiser in the Last Year: Not on file  . Ran Out of Food in the Last Year: Not on file  Transportation Needs:   . Lack of Transportation (Medical): Not on file  . Lack of Transportation (Non-Medical): Not on file  Physical Activity:   . Days of Exercise per Week: Not on file  . Minutes of Exercise per Session: Not on file  Stress:   . Feeling of Stress : Not on file  Social Connections:   . Frequency of Communication with Friends and Family: Not on file  . Frequency of Social Gatherings with Friends and Family: Not on file  . Attends Religious Services: Not on file  . Active Member of Clubs or Organizations: Not on file  . Attends Archivist Meetings: Not on file  . Marital Status: Not on file     Family History: The patient's family history includes AAA (abdominal aortic aneurysm) in her father; Heart attack in her father; Heart disease in her father and another family member; Varicose Veins in her mother.  ROS:   Please see the history of present illness.    All other systems reviewed and  are negative.  EKGs/Labs/Other Studies Reviewed:    The following studies were reviewed today:  Carotid US: 08/16/2018 Right  Carotid: Velocities in the right ICA are consistent with a 40-59% stenosis. Left Carotid: Velocities in the left ICA are consistent with a 40-59% stenosis.  EKG:  EKG is ordered today.  The ekg ordered today demonstrates sinus tachycardia otherwise unchanged from prior and normal.  This was personally reviewed.  Recent Labs: 01/08/2019: B Natriuretic Peptide 186.4; Magnesium 1.8 01/25/2019: ALT 12; BUN 12; Creatinine, Ser 0.95; Hemoglobin 11.9; Platelets 281; Potassium 4.2; Sodium 139; TSH 2.010   Recent Lipid Panel    Component Value Date/Time   CHOL 117 01/09/2019 0053   CHOL 150 01/30/2016 1052   TRIG 86 01/09/2019 0053   HDL 42 01/09/2019 0053   HDL 38 (L) 01/30/2016 1052   CHOLHDL 2.8 01/09/2019 0053   VLDL 17 01/09/2019 0053   LDLCALC 58 01/09/2019 0053   LDLCALC 84 01/30/2016 1052    Physical Exam:    VS:  BP 122/74   Pulse (!) 112   Ht 4\' 11"  (1.499 m)   Wt 162 lb 3.2 oz (73.6 kg)   SpO2 95%   BMI 32.76 kg/m     Wt Readings from Last 3 Encounters:  09/27/19 162 lb 3.2 oz (73.6 kg)  04/20/19 167 lb 6.4 oz (75.9 kg)  02/11/19 170 lb (77.1 kg)    GEN: Well nourished, well developed in no acute distress HEENT: Normal NECK: No JVD; No carotid bruits LYMPHATICS: No lymphadenopathy CARDIAC: RRR, no murmurs, rubs, gallops RESPIRATORY:  Clear to auscultation without rales, wheezing or rhonchi  ABDOMEN: Soft, non-tender, non-distended MUSCULOSKELETAL:  No edema; No deformity  SKIN: Warm and dry NEUROLOGIC:  Alert and oriented x 3 PSYCHIATRIC:  Normal affect   LHC: 01/10/2019    Previously placed Mid Cx to Dist Cx stent (unknown type) is widely patent.  Mid Cx lesion is 30% stenosed.  2nd Mrg-1 lesion is 40% stenosed.  Previously placed 2nd Mrg-2 stent (unknown type) is widely patent.  2nd Mrg-3 lesion is 100% stenosed.  Prox RCA lesion is 30% stenosed.  The left ventricular systolic function is normal.  LV end diastolic pressure is mildly elevated.     Non-ST segment elevation myocardial infarction secondary to distal total occlusion of the obtuse marginal branch of the left circumflex coronary artery,  Normal left main, LAD and mild nonobstructive 30% narrowing in the RCA which supplies the posterior wall.  The left circumflex vessel has 40% ostial stenosis in the OM 2 vessel with a widely patent stent in the midportion of this marginal branch.  The distal marginal is 100% occluded.  There is faint collateralization from the LAD to the distal vessel which is small caliber; the AV groove circumflex has 30% narrowing in the stent in the AV groove circumflex is widely patent.  Serve global LV function with EF estimated 55%.  There is a subtle focal area of distal inferior hypocontractility.  RECOMMENDATION: The patient's ECG is entirely normal.  She is not having any recurrent chest pain.  Her MI was over 48 hours ago.  With her stability and distal occlusion recommend medical management.  Consider DAPT; however, will need to obtain additional information with her history of prior stroke following carotid endarterectomy remotely.  Alternatively aspirin alone and medical therapy for her current obstructive disease.  Smoking cessation is essential.  Aggressive lipid-lowering therapy with target LDL less than 70.    ASSESSMENT:  1. Essential hypertension   2. Coronary artery disease involving native coronary artery of native heart without angina pectoris   3. Mixed hyperlipidemia   4. Bilateral carotid artery stenosis   5. Smoking      PLAN:    In order of problems listed above:  1. CAD- stable -as above occluded distal portion of the obtuse marginal vessel with collaterals from LAD, aggressive medical management was recommended, she is tolerating high-dose of Lipitor and aspirin however not on dual antiplatelet therapy secondary to significant gastric pain and necessity of upper and lower endoscopy in the near future.  She is  currently asymptomatic.  2. Chronic diastolic CHF -she is euvolemic off diuretics now.  3. Hypertension - controlled, continue current management.   4. Hyperlipidemia - on high dose of atorvastatin 80 mg PO QHS, normal LFTs. Tolerated well.  7. Carotid disease - s/p right carotid endarterectomy - complicated by stroke. Followed by Dr Oneida Alar. No bruit.  Most recent carotid ultrasound in July 2020 showed stable 40 to 60% stenosis bilaterally.  She is scheduled to see Dr. Oneida Alar next week..  Medication Adjustments/Labs and Tests Ordered: Current medicines are reviewed at length with the patient today.  Concerns regarding medicines are outlined above.  Orders Placed This Encounter  Procedures  . EKG 12-Lead   No orders of the defined types were placed in this encounter.   Patient Instructions  Medication Instructions:   Your physician recommends that you continue on your current medications as directed. Please refer to the Current Medication list given to you today.  *If you need a refill on your cardiac medications before your next appointment, please call your pharmacy*   Follow-Up: At [facility], you and your health needs are our priority.  As part of our continuing mission to provide you with exceptional heart care, we have created designated Provider Care Teams.  These Care Teams include your primary Cardiologist (physician) and Advanced Practice Providers (APPs -  Physician Assistants and Nurse Practitioners) who all work together to provide you with the care you need, when you need it.  We recommend signing up for the patient portal called "MyChart".  Sign up information is provided on this After Visit Summary.  MyChart is used to connect with patients for Virtual Visits (Telemedicine).  Patients are able to view lab/test results, encounter notes, upcoming appointments, etc.  Non-urgent messages can be sent to your provider as well.   To learn more about what you can do with  MyChart, go to [email or web address].    Your next appointment:   6 month(s)  The format for your next appointment:   In Person  Provider:   Ena Dawley, MD        Signed, Ena Dawley, MD  09/27/2019 3:09 PM    [place]

## 2019-09-28 ENCOUNTER — Ambulatory Visit (HOSPITAL_COMMUNITY)
Admission: RE | Admit: 2019-09-28 | Discharge: 2019-09-28 | Disposition: A | Discharge status: Home / Self Care | Payer: Medicare Other | Source: Ambulatory Visit | Attending: Vascular Surgery | Admitting: Vascular Surgery

## 2019-09-28 ENCOUNTER — Ambulatory Visit (INDEPENDENT_AMBULATORY_CARE_PROVIDER_SITE_OTHER): Payer: Medicare Other | Admitting: Physician Assistant

## 2019-09-28 VITALS — BP 134/84 | HR 85 | Temp 97.7°F | Resp 20 | Ht 59.0 in | Wt 163.1 lb

## 2019-09-28 DIAGNOSIS — I6523 Occlusion and stenosis of bilateral carotid arteries: Secondary | ICD-10-CM

## 2019-09-28 NOTE — Progress Notes (Signed)
Office Note     CC:  follow up Requesting Provider:  [name], MD  HPI: [name] is a 79 y.o. (April 19, 1940) female who presents for routine follow-up of carotid artery stenosis.  She is s/p right carotid endarterectomy on 08/08/2014 by Dr. Oneida Alar. Unfortunately, she suffered a perioperative stroke at that time resulting in left arm weakness and numbness. She continues to have residual left sided weakness and numbness.  This is unchanged. She ambulates with a cane.   She denies monocular blindness, slurred speech. She says she has mild persistent right upper lip droop.  Compliant with aspirin and statin. On beta blocker. Working at smoking cessation with nicotine patches. Past Medical History:  Diagnosis Date  . Acute respiratory failure with hypoxia s/p tracheostomy 07/2013  . Anemia    a. 7-08/2013 felt due to recent critical illness/chronic disease.  . Arthritis    handds, knees & back   . Barrett's esophagus   . CAD (coronary artery disease)    a. Mod dz 2010 initially mgd medically. b. 12/2012 - angina s/p PTCA/DES to mid-circumflex, PTCA/DES to first OM.   Marland Kitchen Carotid artery disease ([place])    a. 60-70% bilat ICA stenosis by dopplers 05/2012.  Marland Kitchen Chronic back pain    deteriorating;DDD  . Colonic obstruction due to suspected colitis; s/p colectomy/colostomy    a. See SBO.  Marland Kitchen Complication of anesthesia    hard to wake up from anesthesia   . COPD (chronic obstructive pulmonary disease) ([place])   . Depression    takes Remeron and Zoloft daily  . Dizziness    was on Bentyl which caused this-took off of it and no problems since  . DVT of axillary vein, acute right ([place])    a. Dx 07/2013 felt due to R IJ central line that had been inserted 7/14. Not on anticoag due to GIB issues and small cerebral bleed.  Marland Kitchen GERD (gastroesophageal reflux disease)   . Heart murmur   . History of bronchitis    > 52yrs ago   . History of colon polyps   . History of hiatal hernia   .  History of kidney stones    was told it was stable but doesn't know for sure that she ever passed it  . HTN (hypertension)   . Hypertension    takes Metoprolol daily  . Intracranial hemorrhage ([place])    a. 08/11/13 - per DC summary, tiny SAH vs SDH done for altered mentation, anticoag stopped including aspirin.  . Joint pain   . Neck pain    DDD  . Other and unspecified hyperlipidemia    takes Lipitor daily  . Pneumonia    hx of->15 yrs ago  . Protein-calorie malnutrition, severe w/ electrolyte imbalance    a. Severe hypoalbuminemia leading to 3rd spacing including anasarca and pulm edema 07/2013.  Marland Kitchen SBO (small bowel obstruction) ([place])    a. Lysis of adhesions and ovarian cystectomy 04/2013 for sBO. b. Admitted 07/2013 for colonic obstruction due to suspected colitis, s/p partial colectomy/colostomy 08/01/13. Admission complicated by anasarca, acute resp failure requiring tracheostomy, decannulated 08/25/13. c. Recurrent SBO8/2015, NGT placed for decompression.   . Stroke [facility]) early 80's   right sided weakness--TIA    Past Surgical History:  Procedure Laterality Date  . ABDOMINAL HYSTERECTOMY    . APPENDECTOMY    . BOWEL RESECTION N/A 08/01/2013   Procedure: SMALL BOWEL RESECTION;  Surgeon: Harl Bowie, MD;  Location: [place];  Service: General;  Laterality: N/A;  . cataract surgery Bilateral   . CHOLECYSTECTOMY    . COLON RESECTION    . COLON SURGERY    . COLOSTOMY N/A 08/01/2013   Procedure: COLOSTOMY;  Surgeon: Harl Bowie, MD;  Location: [place];  Service: General;  Laterality: N/A;  . COLOSTOMY TAKEDOWN  01/24/2014   dr blackman  . COLOSTOMY TAKEDOWN N/A 01/24/2014   Procedure: COLOSTOMY TAKEDOWN;  Surgeon: Coralie Keens, MD;  Location: [place];  Service: General;  Laterality: N/A;  . CORONARY ANGIOPLASTY WITH STENT PLACEMENT  12/20/2012   STENT TO OM         DR COOPER  . ENDARTERECTOMY Right 08/08/2014   Procedure: RIGHT CAROTID ENDARTERECTOMY WITH HEMASHIELD PATCH  ANGIOPLASTY;  Surgeon: Elam Dutch, MD;  Location: [place];  Service: Vascular;  Laterality: Right;  . FOOT SURGERY Left   . HAND SURGERY Bilateral    for ganglion cysts  . LAPAROTOMY N/A 08/01/2013   Procedure: EXPLORATORY LAPAROTOMY;  Surgeon: Harl Bowie, MD;  Location: [place];  Service: General;  Laterality: N/A;  . LEFT HEART CATH AND CORONARY ANGIOGRAPHY N/A 01/10/2019   Procedure: LEFT HEART CATH AND CORONARY ANGIOGRAPHY;  Surgeon: Troy Sine, MD;  Location: [place] [facility];  Service: Cardiovascular;  Laterality: N/A;  . LEFT HEART CATHETERIZATION WITH CORONARY ANGIOGRAM N/A 12/20/2012   Procedure: LEFT HEART CATHETERIZATION WITH CORONARY ANGIOGRAM;  Surgeon: Blane Ohara, MD;  Location: [facility] C[facility] LAB;  Service: Cardiovascular;  Laterality: N/A;  . PARTIAL COLECTOMY N/A 08/01/2013   Procedure: PARTIAL COLECTOMY;  Surgeon: Harl Bowie, MD;  Location: [place];  Service: General;  Laterality: N/A;  . ROTATOR CUFF REPAIR Left   . TONSILLECTOMY    . TRACHEOSTOMY     feinstein    Social History   Socioeconomic History  . Marital status: Married    Spouse name: Not on file  . Number of children: Not on file  . Years of education: Not on file  . Highest education level: Not on file  Occupational History  . Occupation: full time  Tobacco Use  . Smoking status: Current Some Day Smoker    Packs/day: 0.25    Years: 53.00    Pack years: 13.25    Types: Cigarettes  . Smokeless tobacco: Never Used  . Tobacco comment: also uses e cig, 4 A DAY  Vaping Use  . Vaping Use: Never used  Substance and Sexual Activity  . Alcohol use: No    Alcohol/week: 0.0 standard drinks  . Drug use: No  . Sexual activity: Not Currently    Birth control/protection: Surgical  Other Topics Concern  . Not on file  Social History Narrative  . Not on file   Social Determinants of Health   Financial Resource Strain:   . Difficulty of Paying Living Expenses: Not on file  Food  Insecurity:   . Worried About Charity fundraiser in the Last Year: Not on file  . Ran Out of Food in the Last Year: Not on file  Transportation Needs:   . Lack of Transportation (Medical): Not on file  . Lack of Transportation (Non-Medical): Not on file  Physical Activity:   . Days of Exercise per Week: Not on file  . Minutes of Exercise per Session: Not on file  Stress:   . Feeling of Stress : Not on file  Social Connections:   . Frequency of Communication with Friends and Family: Not on file  .  Frequency of Social Gatherings with Friends and Family: Not on file  . Attends Religious Services: Not on file  . Active Member of Clubs or Organizations: Not on file  . Attends Archivist Meetings: Not on file  . Marital Status: Not on file  Intimate Partner Violence:   . Fear of Current or Ex-Partner: Not on file  . Emotionally Abused: Not on file  . Physically Abused: Not on file  . Sexually Abused: Not on file   Family History  Problem Relation Age of Onset  . Varicose Veins Mother   . Heart disease Father   . Heart attack Father   . AAA (abdominal aortic aneurysm) Father   . Heart disease Other     Current Outpatient Medications  Medication Sig Dispense Refill  . aspirin EC 81 MG tablet Take 162 mg by mouth daily.    Marland Kitchen atorvastatin (LIPITOR) 80 MG tablet Take 80 mg by mouth every evening.     Marland Kitchen buPROPion (WELLBUTRIN XL) 150 MG 24 hr tablet Take 1 tablet (150 mg total) by mouth daily. 90 tablet 3  . docusate sodium (COLACE) 100 MG capsule Take 100 mg by mouth as needed for mild constipation.     . fish oil-omega-3 fatty acids 1000 MG capsule Take 1,000 mg by mouth daily.     . fluticasone (FLONASE) 50 MCG/ACT nasal spray Place 1 spray into both nostrils daily as needed for allergies.    . folic acid (FOLVITE) 496 MCG tablet Take 800 mcg by mouth daily.     . metoprolol succinate (TOPROL-XL) 50 MG 24 hr tablet Take 1 tablet (50 mg total) by mouth daily. 90 tablet 2  .  mirtazapine (REMERON) 45 MG tablet Take 45 mg by mouth at bedtime.     . nicotine (NICODERM CQ - DOSED IN MG/24 HR) 7 mg/24hr patch Place 1 patch (7 mg total) onto the skin daily. 28 patch 3  . nitroGLYCERIN (NITROSTAT) 0.4 MG SL tablet Place 1 tablet (0.4 mg total) under the tongue every 5 (five) minutes as needed for chest pain. 25 tablet 4  . ondansetron (ZOFRAN) 4 MG tablet Take 4 mg by mouth every 8 (eight) hours as needed for nausea or vomiting.     Marland Kitchen oxyCODONE (OXY IR/ROXICODONE) 5 MG immediate release tablet Take 5 mg by mouth as needed.    . sertraline (ZOLOFT) 100 MG tablet Take 100 mg by mouth 2 (two) times daily.     No current facility-administered medications for this visit.    Allergies  Allergen Reactions  . Pregabalin Swelling    Tongue swelling  . Sulfonamide Derivatives Swelling    Childhood reaction     REVIEW OF SYSTEMS:   [X]  denotes positive finding, [ ]  denotes negative finding Cardiac  Comments:  Chest pain or chest pressure:    Shortness of breath upon exertion:    Short of breath when lying flat:    Irregular heart rhythm:        Vascular    Pain in calf, thigh, or hip brought on by ambulation:    Pain in feet at night that wakes you up from your sleep:     Blood clot in your veins:    Leg swelling:         Pulmonary    Oxygen at home:    Productive cough:     Wheezing:         Neurologic    Sudden weakness in arms  or legs:     Sudden numbness in arms or legs:     Sudden onset of difficulty speaking or slurred speech:    Temporary loss of vision in one eye:     Problems with dizziness:         Gastrointestinal    Blood in stool:     Vomited blood:         Genitourinary    Burning when urinating:     Blood in urine:        Psychiatric    Major depression:         Hematologic    Bleeding problems:    Problems with blood clotting too easily:        Skin    Rashes or ulcers:        Constitutional    Fever or chills:       PHYSICAL EXAMINATION:  Vitals:   09/28/19 1347 09/28/19 1351  BP: (!) 150/78 134/84  Pulse: 85   Resp: 20   Temp: 97.7 F (36.5 C)   SpO2: 99%    General:  WDWN in NAD; vital signs documented above Gait: Walks with cane; no ataxia HENT: WNL, normocephalic Pulmonary: normal non-labored breathing , without Rales, rhonchi,  wheezing Cardiac: regular HR, without  Murmurs without carotid bruit Abdomen: soft, NT, no masses Skin: without rashes Vascular Exam/Pulses: 2+ brahcial and radial pulses bilaterally. Extremities: without ischemic changes, without Gangrene , without cellulitis; without open wounds;  Musculoskeletal: no muscle wasting or atrophy. 5/5 bilateral hand grip strength  Neurologic: A&O X 3;  No focal weakness or paresthesias are detected Psychiatric:  The pt has Normal affect.   Non-Invasive Vascular Imaging:   09/28/2019 Right Carotid: Velocities in the right ICA are consistent with a 40-59% stenosis.   Left Carotid: Velocities in the left ICA are consistent with a 40-59% stenosis.   Vertebrals: Bilateral vertebral arteries demonstrate antegrade flow.  Subclavians: Normal flow hemodynamics were seen in bilateral subclavian arteries.    Carotid Duplex (08-16-18): Right Carotid: Velocities in the right ICA are consistent with a 40-59%stenosis. Left Carotid: Velocities in the left ICA are consistent with a 40-59% stenosis. Vertebrals: Bilateral vertebral arteries demonstrate antegrade flow. Subclavians: Normal flow hemodynamics were seen in bilateral subclavian arteries.  Increased stenosis in the right, decreased stenosis in the left ICA compared to the exam on 11-05-17.   ASSESSMENT/PLAN:: 79 y.o. female here for follow up for bilateral carotid artery stenosis.  No change in estimated stenoses as compared to one year ago.  Residual peri-stroke left sided weakness unchanged.  No acute symptoms.  Seek immediate medical attention should these occur.  Follow-up in  one year with carotid duplex.  Barbie Banner, PA-C [facility] [phone]  Clinic MD:   Scot Dock

## 2019-10-03 DIAGNOSIS — M5489 Other dorsalgia: Secondary | ICD-10-CM | POA: Diagnosis not present

## 2019-10-03 DIAGNOSIS — M546 Pain in thoracic spine: Secondary | ICD-10-CM | POA: Diagnosis not present

## 2019-10-03 DIAGNOSIS — R2689 Other abnormalities of gait and mobility: Secondary | ICD-10-CM | POA: Diagnosis not present

## 2019-10-03 DIAGNOSIS — G8929 Other chronic pain: Secondary | ICD-10-CM | POA: Diagnosis not present

## 2019-10-04 DIAGNOSIS — G8929 Other chronic pain: Secondary | ICD-10-CM | POA: Diagnosis not present

## 2019-10-04 DIAGNOSIS — M5489 Other dorsalgia: Secondary | ICD-10-CM | POA: Diagnosis not present

## 2019-10-04 DIAGNOSIS — M546 Pain in thoracic spine: Secondary | ICD-10-CM | POA: Diagnosis not present

## 2019-10-04 DIAGNOSIS — R2689 Other abnormalities of gait and mobility: Secondary | ICD-10-CM | POA: Diagnosis not present

## 2019-12-06 DIAGNOSIS — R739 Hyperglycemia, unspecified: Secondary | ICD-10-CM | POA: Diagnosis not present

## 2019-12-06 DIAGNOSIS — I10 Essential (primary) hypertension: Secondary | ICD-10-CM | POA: Diagnosis not present

## 2019-12-06 DIAGNOSIS — M1990 Unspecified osteoarthritis, unspecified site: Secondary | ICD-10-CM | POA: Diagnosis not present

## 2019-12-06 DIAGNOSIS — D539 Nutritional anemia, unspecified: Secondary | ICD-10-CM | POA: Diagnosis not present

## 2019-12-06 DIAGNOSIS — E785 Hyperlipidemia, unspecified: Secondary | ICD-10-CM | POA: Diagnosis not present

## 2019-12-06 DIAGNOSIS — Z23 Encounter for immunization: Secondary | ICD-10-CM | POA: Diagnosis not present

## 2019-12-21 ENCOUNTER — Other Ambulatory Visit: Payer: Self-pay | Admitting: Cardiology

## 2019-12-21 NOTE — Telephone Encounter (Signed)
rx refill

## 2020-01-23 ENCOUNTER — Other Ambulatory Visit: Payer: Self-pay

## 2020-01-23 ENCOUNTER — Emergency Department
Admission: EM | Admit: 2020-01-23 | Discharge: 2020-01-23 | Disposition: A | Discharge status: Home / Self Care | Payer: Medicare Other | Attending: Emergency Medicine | Admitting: Emergency Medicine

## 2020-01-23 ENCOUNTER — Encounter: Payer: Self-pay | Admitting: Emergency Medicine

## 2020-01-23 ENCOUNTER — Emergency Department: Payer: Medicare Other

## 2020-01-23 DIAGNOSIS — S61411A Laceration without foreign body of right hand, initial encounter: Secondary | ICD-10-CM | POA: Diagnosis not present

## 2020-01-23 DIAGNOSIS — W548XXA Other contact with dog, initial encounter: Secondary | ICD-10-CM | POA: Insufficient documentation

## 2020-01-23 DIAGNOSIS — I110 Hypertensive heart disease with heart failure: Secondary | ICD-10-CM | POA: Insufficient documentation

## 2020-01-23 DIAGNOSIS — Z79899 Other long term (current) drug therapy: Secondary | ICD-10-CM | POA: Insufficient documentation

## 2020-01-23 DIAGNOSIS — Z23 Encounter for immunization: Secondary | ICD-10-CM | POA: Diagnosis not present

## 2020-01-23 DIAGNOSIS — R58 Hemorrhage, not elsewhere classified: Secondary | ICD-10-CM | POA: Diagnosis not present

## 2020-01-23 DIAGNOSIS — E119 Type 2 diabetes mellitus without complications: Secondary | ICD-10-CM | POA: Diagnosis not present

## 2020-01-23 DIAGNOSIS — S6991XA Unspecified injury of right wrist, hand and finger(s), initial encounter: Secondary | ICD-10-CM | POA: Diagnosis present

## 2020-01-23 DIAGNOSIS — R600 Localized edema: Secondary | ICD-10-CM | POA: Diagnosis not present

## 2020-01-23 DIAGNOSIS — W5581XA Bitten by other mammals, initial encounter: Secondary | ICD-10-CM | POA: Diagnosis not present

## 2020-01-23 DIAGNOSIS — Z7982 Long term (current) use of aspirin: Secondary | ICD-10-CM | POA: Insufficient documentation

## 2020-01-23 DIAGNOSIS — F17210 Nicotine dependence, cigarettes, uncomplicated: Secondary | ICD-10-CM | POA: Diagnosis not present

## 2020-01-23 DIAGNOSIS — I5032 Chronic diastolic (congestive) heart failure: Secondary | ICD-10-CM | POA: Diagnosis not present

## 2020-01-23 DIAGNOSIS — J449 Chronic obstructive pulmonary disease, unspecified: Secondary | ICD-10-CM | POA: Diagnosis not present

## 2020-01-23 DIAGNOSIS — I2510 Atherosclerotic heart disease of native coronary artery without angina pectoris: Secondary | ICD-10-CM | POA: Diagnosis not present

## 2020-01-23 MED ORDER — LIDOCAINE-EPINEPHRINE (PF) 2 %-1:200000 IJ SOLN
20.0000 mL | Freq: Once | INTRAMUSCULAR | Status: AC
  Administered 2020-01-23: 20 mL
  Filled 2020-01-23: qty 20

## 2020-01-23 MED ORDER — MORPHINE SULFATE (PF) 4 MG/ML IV SOLN
4.0000 mg | Freq: Once | INTRAVENOUS | Status: AC
  Administered 2020-01-23: 4 mg via INTRAMUSCULAR
  Filled 2020-01-23: qty 1

## 2020-01-23 MED ORDER — TETANUS-DIPHTH-ACELL PERTUSSIS 5-2.5-18.5 LF-MCG/0.5 IM SUSY
0.5000 mL | PREFILLED_SYRINGE | Freq: Once | INTRAMUSCULAR | Status: AC
  Administered 2020-01-23: 0.5 mL via INTRAMUSCULAR
  Filled 2020-01-23: qty 0.5

## 2020-01-23 MED ORDER — AMOXICILLIN-POT CLAVULANATE 875-125 MG PO TABS: 1.0000 | ORAL_TABLET | Freq: Two times a day (BID) | ORAL | 0 refills | Status: AC

## 2020-01-23 MED ORDER — ONDANSETRON 4 MG PO TBDP
4.0000 mg | ORAL_TABLET | Freq: Once | ORAL | Status: AC
  Administered 2020-01-23: 4 mg via ORAL
  Filled 2020-01-23: qty 1

## 2020-01-23 NOTE — ED Triage Notes (Signed)
Pt comes into the ED via [facility] from home c/o skin tear to the posterior side of the right hand.  Pt's dog scratched her hand and tore the skin.  All bleeding under control at this time.  Pt denies any blood thinner use.

## 2020-01-23 NOTE — ED Provider Notes (Signed)
[facility] Emergency Department Provider Note  ____________________________________________  Time seen: Approximately 5:32 PM  I have reviewed the triage vital signs and the nursing notes.   HISTORY  Chief Complaint Hand Injury    HPI [name] is a 80 y.o. female with a past history of CAD, chronic back pain, COPD hypertension who comes ED complaining of right hand pain.  She reports that her dog and her daughter's dog were both jumping and trying to go outside, and inadvertently scratched the patient's hand causing a wound.  Constant pain in that area since then, nonradiating.  Denies any motor weakness or loss of sensation.  No coolness of the fingers.  Unable to recall what her last tetanus shot was.  Labs are vaccinated, no abnormal behavior, no bite wounds.      Past Medical History:  Diagnosis Date  . Acute respiratory failure with hypoxia s/p tracheostomy 07/2013  . Anemia    a. 7-08/2013 felt due to recent critical illness/chronic disease.  . Arthritis    handds, knees & back   . Barrett's esophagus   . CAD (coronary artery disease)    a. Mod dz 2010 initially mgd medically. b. 12/2012 - angina s/p PTCA/DES to mid-circumflex, PTCA/DES to first OM.   Marland Kitchen Carotid artery disease ([place])    a. 60-70% bilat ICA stenosis by dopplers 05/2012.  Marland Kitchen Chronic back pain    deteriorating;DDD  . Colonic obstruction due to suspected colitis; s/p colectomy/colostomy    a. See SBO.  Marland Kitchen Complication of anesthesia    hard to wake up from anesthesia   . COPD (chronic obstructive pulmonary disease) ([place])   . Depression    takes Remeron and Zoloft daily  . Dizziness    was on Bentyl which caused this-took off of it and no problems since  . DVT of axillary vein, acute right ([place])    a. Dx 07/2013 felt due to R IJ central line that had been inserted 7/14. Not on anticoag due to GIB issues and small cerebral bleed.  Marland Kitchen GERD (gastroesophageal reflux disease)    . Heart murmur   . History of bronchitis    > 17yrs ago   . History of colon polyps   . History of hiatal hernia   . History of kidney stones    was told it was stable but doesn't know for sure that she ever passed it  . HTN (hypertension)   . Hypertension    takes Metoprolol daily  . Intracranial hemorrhage ([place])    a. 08/11/13 - per DC summary, tiny SAH vs SDH done for altered mentation, anticoag stopped including aspirin.  . Joint pain   . Neck pain    DDD  . Other and unspecified hyperlipidemia    takes Lipitor daily  . Pneumonia    hx of->15 yrs ago  . Protein-calorie malnutrition, severe w/ electrolyte imbalance    a. Severe hypoalbuminemia leading to 3rd spacing including anasarca and pulm edema 07/2013.  Marland Kitchen SBO (small bowel obstruction) ([place])    a. Lysis of adhesions and ovarian cystectomy 04/2013 for sBO. b. Admitted 07/2013 for colonic obstruction due to suspected colitis, s/p partial colectomy/colostomy 08/01/13. Admission complicated by anasarca, acute resp failure requiring tracheostomy, decannulated 08/25/13. c. Recurrent SBO8/2015, NGT placed for decompression.   . Stroke [facility]) early 80's   right sided weakness--TIA     Patient Active Problem List   Diagnosis Date Noted  . Type 2 diabetes mellitus with complication,  without long-term current use of insulin ([place]) 01/11/2019  . NSTEMI (non-ST elevated myocardial infarction) ([place]) 01/08/2019  . Carotid stenosis 10/23/2014  . S/P carotid endarterectomy 10/23/2014  . Cerebrovascular accident (CVA) due to embolism of right middle cerebral artery ([place]) 10/23/2014  . Subacromial impingement of left shoulder 10/02/2014  . Adhesive capsulitis of left shoulder 10/02/2014  . Other impaction of intestine ([place])   . Left hemiparesis ([place]) 08/13/2014  . Acute right arterial ischemic stroke, middle cerebral artery (MCA) ([place]) 08/11/2014  . Carotid artery stenosis, symptomatic 08/08/2014  . Carotid disease, bilateral ([place]) 06/02/2014   . Coronary artery disease due to lipid rich plaque 06/02/2014  . Essential hypertension 06/02/2014  . Hyperlipidemia 06/02/2014  . Acute respiratory failure ([place])   . S/P colostomy takedown 01/24/2014  . Chronic diastolic CHF (congestive heart failure), NYHA class 2 ([place]) 11/10/2013  . Atypical chest pain 10/20/2013  . Malnutrition of moderate degree ([place]) 09/18/2013  . SBO (small bowel obstruction) ([place]) 09/17/2013  . H/O tracheostomy 09/17/2013  . Acute respiratory failure with hypoxia s/p tracheostomy 08/19/2013  . Physical deconditioning 08/19/2013  . HTN (hypertension) 08/19/2013  . Dehydration with hypernatremia 08/19/2013  . DVT of axillary vein, acute right ([place]) 08/19/2013  . Anemia 08/19/2013  . Anasarca 08/14/2013  . Hypokalemia 07/31/2013  . Hypomagnesemia 07/31/2013  . Protein-calorie malnutrition, severe w/ electrolyte imbalance 07/31/2013  . Colonic obstruction due to suspected colitis; s/p colectomy/colostomy 07/29/2013  . CAD (coronary artery disease)   . Depression   . Barrett's esophagus   . Arthritis   . Other and unspecified angina pectoris 12/20/2012  . Chest pain 12/14/2012  . TOBACCO ABUSE 11/08/2009  . Hyperlipidemia, mixed 04/17/2008     Past Surgical History:  Procedure Laterality Date  . ABDOMINAL HYSTERECTOMY    . APPENDECTOMY    . BOWEL RESECTION N/A 08/01/2013   Procedure: SMALL BOWEL RESECTION;  Surgeon: Harl Bowie, MD;  Location: [place];  Service: General;  Laterality: N/A;  . cataract surgery Bilateral   . CHOLECYSTECTOMY    . COLON RESECTION    . COLON SURGERY    . COLOSTOMY N/A 08/01/2013   Procedure: COLOSTOMY;  Surgeon: Harl Bowie, MD;  Location: [place];  Service: General;  Laterality: N/A;  . COLOSTOMY TAKEDOWN  01/24/2014   dr blackman  . COLOSTOMY TAKEDOWN N/A 01/24/2014   Procedure: COLOSTOMY TAKEDOWN;  Surgeon: Coralie Keens, MD;  Location: [place];  Service: General;  Laterality: N/A;  . CORONARY ANGIOPLASTY WITH  STENT PLACEMENT  12/20/2012   STENT TO OM         DR COOPER  . ENDARTERECTOMY Right 08/08/2014   Procedure: RIGHT CAROTID ENDARTERECTOMY WITH HEMASHIELD PATCH ANGIOPLASTY;  Surgeon: Elam Dutch, MD;  Location: [place];  Service: Vascular;  Laterality: Right;  . FOOT SURGERY Left   . HAND SURGERY Bilateral    for ganglion cysts  . LAPAROTOMY N/A 08/01/2013   Procedure: EXPLORATORY LAPAROTOMY;  Surgeon: Harl Bowie, MD;  Location: [place];  Service: General;  Laterality: N/A;  . LEFT HEART CATH AND CORONARY ANGIOGRAPHY N/A 01/10/2019   Procedure: LEFT HEART CATH AND CORONARY ANGIOGRAPHY;  Surgeon: Troy Sine, MD;  Location: [place] CV LAB;  Service: Cardiovascular;  Laterality: N/A;  . LEFT HEART CATHETERIZATION WITH CORONARY ANGIOGRAM N/A 12/20/2012   Procedure: LEFT HEART CATHETERIZATION WITH CORONARY ANGIOGRAM;  Surgeon: Blane Ohara, MD;  Location: [facility] CATH LAB;  Service: Cardiovascular;  Laterality: N/A;  . PARTIAL COLECTOMY N/A  08/01/2013   Procedure: PARTIAL COLECTOMY;  Surgeon: Harl Bowie, MD;  Location: [place];  Service: General;  Laterality: N/A;  . ROTATOR CUFF REPAIR Left   . TONSILLECTOMY    . TRACHEOSTOMY     feinstein     Prior to Admission medications   Medication Sig Start Date End Date Taking? Authorizing Provider  amoxicillin-clavulanate (AUGMENTIN) 875-125 MG tablet Take 1 tablet by mouth 2 (two) times daily for 7 days. 01/23/20 01/30/20 Yes Carrie Mew, MD  aspirin EC 81 MG tablet Take 162 mg by mouth daily.    [provider]  atorvastatin (LIPITOR) 80 MG tablet Take 80 mg by mouth every evening.     [provider]  buPROPion (WELLBUTRIN XL) 150 MG 24 hr tablet Take 1 tablet (150 mg total) by mouth daily. 03/02/15   Dorothy Spark, MD  docusate sodium (COLACE) 100 MG capsule Take 100 mg by mouth as needed for mild constipation.     [provider]  fish oil-omega-3 fatty acids 1000 MG capsule Take 1,000 mg by  mouth daily.     [provider]  fluticasone (FLONASE) 50 MCG/ACT nasal spray Place 1 spray into both nostrils daily as needed for allergies. 11/18/18   [provider]  folic acid (FOLVITE) 425 MCG tablet Take 800 mcg by mouth daily.     [provider]  metoprolol succinate (TOPROL-XL) 50 MG 24 hr tablet TAKE 1 TABLET BY MOUTH  DAILY 12/21/19   Camnitz, Ocie Doyne, MD  mirtazapine (REMERON) 45 MG tablet Take 45 mg by mouth at bedtime.  06/29/18   [provider]  nicotine (NICODERM CQ - DOSED IN MG/24 HR) 7 mg/24hr patch Place 1 patch (7 mg total) onto the skin daily. 01/12/19   Furth, Cadence H, PA-C  nitroGLYCERIN (NITROSTAT) 0.4 MG SL tablet Place 1 tablet (0.4 mg total) under the tongue every 5 (five) minutes as needed for chest pain. 10/01/18   Nahser, Wonda Cheng, MD  ondansetron (ZOFRAN) 4 MG tablet Take 4 mg by mouth every 8 (eight) hours as needed for nausea or vomiting.     [provider]  oxyCODONE (OXY IR/ROXICODONE) 5 MG immediate release tablet Take 5 mg by mouth as needed. 03/21/19   [provider]  sertraline (ZOLOFT) 100 MG tablet Take 100 mg by mouth 2 (two) times daily. 06/27/14   [provider]     Allergies Pregabalin and Sulfonamide derivatives   Family History  Problem Relation Age of Onset  . Varicose Veins Mother   . Heart disease Father   . Heart attack Father   . AAA (abdominal aortic aneurysm) Father   . Heart disease Other     Social History Social History   Tobacco Use  . Smoking status: Current Some Day Smoker    Packs/day: 0.25    Years: 53.00    Pack years: 13.25    Types: Cigarettes  . Smokeless tobacco: Never Used  . Tobacco comment: also uses e cig, 4 A DAY  Vaping Use  . Vaping Use: Never used  Substance Use Topics  . Alcohol use: No    Alcohol/week: 0.0 standard drinks  . Drug use: No    Review of Systems  Constitutional:   No fever or chills.  ENT:   No sore throat. No  rhinorrhea. Cardiovascular:   No chest pain or syncope. Respiratory:   No dyspnea or cough. Gastrointestinal:   Negative for abdominal pain, vomiting and diarrhea.  Musculoskeletal:   Right hand pain as above All other systems reviewed and are negative except as documented above in ROS and HPI.  ____________________________________________   PHYSICAL EXAM:  VITAL SIGNS: ED Triage Vitals  Enc Vitals Group     BP 01/23/20 1534 121/74     Pulse Rate 01/23/20 1534 93     Resp 01/23/20 1534 20     Temp 01/23/20 1534 98.2 F (36.8 C)     Temp Source 01/23/20 1534 Oral     SpO2 01/23/20 1534 96 %     Weight 01/23/20 1535 167 lb (75.8 kg)     Height 01/23/20 1535 4\' 11"  (1.499 m)     Head Circumference --      Peak Flow --      Pain Score 01/23/20 1535 10     Pain Loc --      Pain Edu? --      Excl. in [place]? --     Vital signs reviewed, nursing assessments reviewed.   Constitutional:   Alert and oriented. Non-toxic appearance. Eyes:   Conjunctivae are normal. EOMI. ENT      Head:   Normocephalic and atraumatic.      Mouth/Throat:   MMM      Neck:   No meningismus. Full ROM.  Cardiovascular:   RRR. Cap refill less than 2 seconds. Respiratory:   Unlabored breathing Musculoskeletal:   Normal range of motion in all extremities.  There is a stellate laceration to the dorsal ulnar half of the right hand.  Tendon function is intact including extensors flexors and lumbricals. Neurologic:   Normal speech and language.  Sensation intact Motor grossly intact. No acute focal neurologic deficits are appreciated.  Skin:    Skin is warm, dry with hand wound as noted above.  ____________________________________________    LABS (pertinent positives/negatives) (all labs ordered are listed, but only abnormal results are displayed) Labs Reviewed - No data to display ____________________________________________   EKG  ____________________________________________    RADIOLOGY  DG  Hand Complete Right  Result Date: 01/23/2020 CLINICAL DATA:  Right hand dorsal laceration from dog scratch. EXAM: RIGHT HAND - COMPLETE 3+ VIEW COMPARISON:  None. FINDINGS: There is no evidence of acute displaced fracture or dislocation. No aggressive appearing focal bone abnormality. No cortical erosion or destruction. Proximal and distal interphalangeal joint degenerative changes. First carpometacarpal degenerative changes. Radioulnar and radiocarpal degenerative changes. Old nonunionized fracture of the ulnar styloid. Suggestion of a dorsal medial hand laceration consistent with given history. Associated subcutaneus soft tissue edema and emphysema. No retained radiopaque foreign body. IMPRESSION: 1. No acute displaced fracture or dislocation of the bones of the right hand. 2. No retained radiopaque foreign body in a patient status post hand laceration. Electronically Signed   By: Iven Finn M.D.   On: 01/23/2020 17:44    ____________________________________________   PROCEDURES Procedures  ____________________________________________  CLINICAL IMPRESSION / ASSESSMENT AND PLAN / ED COURSE  Pertinent labs & imaging results that were available during my care of the patient were reviewed by me and considered in my medical decision making (see chart for details).  [name] was evaluated in Emergency Department on 01/23/2020 for the symptoms described in the history of present illness. She was evaluated in the context of the global COVID-19 pandemic, which necessitated consideration that the patient might be at risk for infection with the SARS-CoV-2 virus that causes COVID-19. Institutional protocols and algorithms that pertain to the evaluation of patients at risk  for COVID-19 are in a state of rapid change based on information released by regulatory bodies including the CDC and federal and state organizations. These policies and algorithms were followed during the patient's care in the  ED.   Patient presents with laceration of the right dorsal hand from dog scratch.  We will start her on prophylactic antibiotics, update tetanus, provide wound care and loose approximation repair.  Clinical Course as of 01/23/20 1810  Mon Jan 23, 2020  1648 DG Hand Complete Right [RR]  1805 Hand xray viewed and interpreted by me. No foreign body or fractures. Radiology report reviewed and confirms my findings.  Wound repaired by NP Triplett - see her procedure note for more details. Will start pt on abx prophylaxis. Pt was counseled on dressing changes and monitoring wound for signs of infection. F/u for wound check in 2-3 days, suture removal in 10 days.  [PS]    Clinical Course User Index [PS] Carrie Mew, MD [RR] [place], [place], [place]    ----------------------------------------- 6:10 PM on 01/23/2020 -----------------------------------------  Repeat exam shows satisfactory repair. Pain improved and adequately controlled.  Wound approximation left appropriately loose intentionally to allow for tissue swelling/drainage as needed.    ____________________________________________   FINAL CLINICAL IMPRESSION(S) / ED DIAGNOSES    Final diagnoses:  Laceration of right hand without foreign body, initial encounter     ED Discharge Orders         Ordered    amoxicillin-clavulanate (AUGMENTIN) 875-125 MG tablet  2 times daily        01/23/20 1807          Portions of this note were generated with dragon dictation software. Dictation errors may occur despite best attempts at proofreading.   Carrie Mew, MD 01/23/20 [phone]

## 2020-01-23 NOTE — ED Provider Notes (Signed)
..  Laceration Repair  Date/Time: 01/23/2020 6:06 PM Performed by: Victorino Dike, FNP Authorized by: Victorino Dike, FNP   Consent:    Consent obtained:  Verbal   Consent given by:  Patient   Risks discussed:  Infection, poor cosmetic result and poor wound healing Anesthesia:    Anesthesia method:  Local infiltration   Local anesthetic:  Lidocaine 2% WITH epi Laceration details:    Location:  Hand   Hand location:  R hand, dorsum   Length (cm):  16 Pre-procedure details:    Preparation:  Patient was prepped and draped in usual sterile fashion Exploration:    Hemostasis achieved with:  Epinephrine   Imaging outcome: foreign body not noted     Wound exploration: wound explored through full range of motion and entire depth of wound visualized     Wound extent: no foreign bodies/material noted, no muscle damage noted and no tendon damage noted   Treatment:    Area cleansed with:  Chlorhexidine, povidone-iodine and saline   Amount of cleaning:  Extensive   Irrigation solution:  Sterile saline   Irrigation volume:  500   Irrigation method:  Syringe   Debridement:  Minimal Skin repair:    Repair method:  Sutures   Suture size:  4-0   Suture material:  Nylon   Number of sutures:  10 Approximation:    Approximation:  Close Repair type:    Repair type:  Intermediate Post-procedure details:    Dressing:  Sterile dressing   Procedure completion:  Tolerated well, no immediate complications      Victorino Dike, FNP 01/23/20 Silvano Bilis, MD 01/23/20 1810

## 2020-01-23 NOTE — ED Triage Notes (Signed)
FIRST NURSE: Pt to ER via EMS for skin tear from a dog scratch.  Bleeding controlled, NAD noted at present.

## 2020-01-23 NOTE — Discharge Instructions (Signed)
You should follow up with your doctor, walk-in clinic, or urgent care in 2-3 days for a wound recheck.  You should follow up for suture removal in 10 days.

## 2020-01-26 DIAGNOSIS — L03113 Cellulitis of right upper limb: Secondary | ICD-10-CM | POA: Diagnosis not present

## 2020-01-26 DIAGNOSIS — M25552 Pain in left hip: Secondary | ICD-10-CM | POA: Diagnosis not present

## 2020-01-26 DIAGNOSIS — S61411A Laceration without foreign body of right hand, initial encounter: Secondary | ICD-10-CM | POA: Diagnosis not present

## 2020-01-29 DIAGNOSIS — K439 Ventral hernia without obstruction or gangrene: Secondary | ICD-10-CM | POA: Diagnosis not present

## 2020-01-29 DIAGNOSIS — I10 Essential (primary) hypertension: Secondary | ICD-10-CM | POA: Diagnosis not present

## 2020-01-29 DIAGNOSIS — M7989 Other specified soft tissue disorders: Secondary | ICD-10-CM | POA: Diagnosis not present

## 2020-01-29 DIAGNOSIS — K469 Unspecified abdominal hernia without obstruction or gangrene: Secondary | ICD-10-CM | POA: Diagnosis not present

## 2020-01-29 DIAGNOSIS — I440 Atrioventricular block, first degree: Secondary | ICD-10-CM | POA: Diagnosis not present

## 2020-01-29 DIAGNOSIS — R109 Unspecified abdominal pain: Secondary | ICD-10-CM | POA: Diagnosis not present

## 2020-01-29 DIAGNOSIS — T8149XA Infection following a procedure, other surgical site, initial encounter: Secondary | ICD-10-CM | POA: Diagnosis not present

## 2020-01-29 DIAGNOSIS — J90 Pleural effusion, not elsewhere classified: Secondary | ICD-10-CM | POA: Diagnosis not present

## 2020-01-29 DIAGNOSIS — M25552 Pain in left hip: Secondary | ICD-10-CM | POA: Diagnosis not present

## 2020-01-29 DIAGNOSIS — M19041 Primary osteoarthritis, right hand: Secondary | ICD-10-CM | POA: Diagnosis not present

## 2020-01-31 DIAGNOSIS — M25552 Pain in left hip: Secondary | ICD-10-CM | POA: Diagnosis not present

## 2020-01-31 DIAGNOSIS — S6991XA Unspecified injury of right wrist, hand and finger(s), initial encounter: Secondary | ICD-10-CM | POA: Diagnosis not present

## 2020-02-02 DIAGNOSIS — S61411D Laceration without foreign body of right hand, subsequent encounter: Secondary | ICD-10-CM | POA: Diagnosis not present

## 2020-02-02 DIAGNOSIS — K439 Ventral hernia without obstruction or gangrene: Secondary | ICD-10-CM | POA: Diagnosis not present

## 2020-02-02 DIAGNOSIS — M25552 Pain in left hip: Secondary | ICD-10-CM | POA: Diagnosis not present

## 2020-02-02 DIAGNOSIS — L03113 Cellulitis of right upper limb: Secondary | ICD-10-CM | POA: Diagnosis not present

## 2020-02-03 DIAGNOSIS — M25552 Pain in left hip: Secondary | ICD-10-CM | POA: Diagnosis not present

## 2020-02-03 DIAGNOSIS — R600 Localized edema: Secondary | ICD-10-CM | POA: Diagnosis not present

## 2020-02-07 DIAGNOSIS — S61411D Laceration without foreign body of right hand, subsequent encounter: Secondary | ICD-10-CM | POA: Diagnosis not present

## 2020-02-07 DIAGNOSIS — M25552 Pain in left hip: Secondary | ICD-10-CM | POA: Diagnosis not present

## 2020-02-07 DIAGNOSIS — L03113 Cellulitis of right upper limb: Secondary | ICD-10-CM | POA: Diagnosis not present

## 2020-02-07 DIAGNOSIS — S6991XA Unspecified injury of right wrist, hand and finger(s), initial encounter: Secondary | ICD-10-CM | POA: Diagnosis not present

## 2020-02-23 DIAGNOSIS — M79641 Pain in right hand: Secondary | ICD-10-CM | POA: Diagnosis not present

## 2020-02-23 DIAGNOSIS — L03113 Cellulitis of right upper limb: Secondary | ICD-10-CM | POA: Diagnosis not present

## 2020-02-23 DIAGNOSIS — S6991XD Unspecified injury of right wrist, hand and finger(s), subsequent encounter: Secondary | ICD-10-CM | POA: Diagnosis not present

## 2020-02-27 DIAGNOSIS — R600 Localized edema: Secondary | ICD-10-CM | POA: Diagnosis not present

## 2020-02-27 DIAGNOSIS — L03113 Cellulitis of right upper limb: Secondary | ICD-10-CM | POA: Diagnosis not present

## 2020-02-27 DIAGNOSIS — M7989 Other specified soft tissue disorders: Secondary | ICD-10-CM | POA: Diagnosis not present

## 2020-02-27 DIAGNOSIS — M19031 Primary osteoarthritis, right wrist: Secondary | ICD-10-CM | POA: Diagnosis not present

## 2020-03-27 DIAGNOSIS — Z9181 History of falling: Secondary | ICD-10-CM | POA: Diagnosis not present

## 2020-03-27 DIAGNOSIS — E785 Hyperlipidemia, unspecified: Secondary | ICD-10-CM | POA: Diagnosis not present

## 2020-03-27 DIAGNOSIS — Z0000 Encounter for general adult medical examination without abnormal findings: Secondary | ICD-10-CM | POA: Diagnosis not present

## 2020-04-06 DIAGNOSIS — M79641 Pain in right hand: Secondary | ICD-10-CM | POA: Diagnosis not present

## 2020-04-06 DIAGNOSIS — D539 Nutritional anemia, unspecified: Secondary | ICD-10-CM | POA: Diagnosis not present

## 2020-04-06 DIAGNOSIS — I10 Essential (primary) hypertension: Secondary | ICD-10-CM | POA: Diagnosis not present

## 2020-04-06 DIAGNOSIS — K2100 Gastro-esophageal reflux disease with esophagitis, without bleeding: Secondary | ICD-10-CM | POA: Diagnosis not present

## 2020-04-06 DIAGNOSIS — R739 Hyperglycemia, unspecified: Secondary | ICD-10-CM | POA: Diagnosis not present

## 2020-04-06 DIAGNOSIS — J449 Chronic obstructive pulmonary disease, unspecified: Secondary | ICD-10-CM | POA: Diagnosis not present

## 2020-04-06 DIAGNOSIS — M1990 Unspecified osteoarthritis, unspecified site: Secondary | ICD-10-CM | POA: Diagnosis not present

## 2020-04-06 DIAGNOSIS — J309 Allergic rhinitis, unspecified: Secondary | ICD-10-CM | POA: Diagnosis not present

## 2020-04-06 DIAGNOSIS — I2510 Atherosclerotic heart disease of native coronary artery without angina pectoris: Secondary | ICD-10-CM | POA: Diagnosis not present

## 2020-04-06 DIAGNOSIS — E785 Hyperlipidemia, unspecified: Secondary | ICD-10-CM | POA: Diagnosis not present

## 2020-04-10 ENCOUNTER — Ambulatory Visit: Payer: Medicare Other | Admitting: Cardiology

## 2020-05-05 NOTE — Progress Notes (Deleted)
Cardiology Office Note:    Date:  05/05/2020   ID:  [name], DOB [birth year], MRN [id number]  PCP:  [name], MD   [place]  Cardiologist:  Ena Dawley, MD  Advanced Practice Provider:  No care team member to display Electrophysiologist:  Will Meredith Leeds, MD    Referring MD: [name], MD     History of Present Illness:    [name] is a 80 y.o. female with a hx of CAD s/p PCI to OM1, HTN, HLD, and PAD who was previously followed by Dr. Meda Coffee now returning to clinic for follow-up.  Per review of the record, the patient underwent a cardiac cath on 12/20/2012 with finding of severe stenosis in OM1 and received 2 DES. In May 2015 had a SBO and required partial colon resection. She underwent right carotid endarterectomy on 96/28/3662 that was complicated by postop stroke in the recovery room. This resulted in a left hemiplegia. She was hospitalized in December 2020 with retrosternal chest pressure, and was diagnosed with non-STEMI. High-sensitivity troponin was up to 23,000. She underwent cardiac catheterization that showed distal total occlusion of the obtuse marginal branch of the left circumflex coronary artery.  There was faint collateralization from the LAD.  Her LVEF was 55 to 60%. No PCI performed and patient was continued on medical management.   Past Medical History:  Diagnosis Date  . Acute respiratory failure with hypoxia s/p tracheostomy 07/2013  . Anemia    a. 7-08/2013 felt due to recent critical illness/chronic disease.  . Arthritis    handds, knees & back   . Barrett's esophagus   . CAD (coronary artery disease)    a. Mod dz 2010 initially mgd medically. b. 12/2012 - angina s/p PTCA/DES to mid-circumflex, PTCA/DES to first OM.   Marland Kitchen Carotid artery disease ([place])    a. 60-70% bilat ICA stenosis by dopplers 05/2012.  Marland Kitchen Chronic back pain    deteriorating;DDD  . Colonic obstruction due to suspected  colitis; s/p colectomy/colostomy    a. See SBO.  Marland Kitchen Complication of anesthesia    hard to wake up from anesthesia   . COPD (chronic obstructive pulmonary disease) ([place])   . Depression    takes Remeron and Zoloft daily  . Dizziness    was on Bentyl which caused this-took off of it and no problems since  . DVT of axillary vein, acute right ([place])    a. Dx 07/2013 felt due to R IJ central line that had been inserted 7/14. Not on anticoag due to GIB issues and small cerebral bleed.  Marland Kitchen GERD (gastroesophageal reflux disease)   . Heart murmur   . History of bronchitis    > 39yrs ago   . History of colon polyps   . History of hiatal hernia   . History of kidney stones    was told it was stable but doesn't know for sure that she ever passed it  . HTN (hypertension)   . Hypertension    takes Metoprolol daily  . Intracranial hemorrhage ([place])    a. 08/11/13 - per DC summary, tiny SAH vs SDH done for altered mentation, anticoag stopped including aspirin.  . Joint pain   . Neck pain    DDD  . Other and unspecified hyperlipidemia    takes Lipitor daily  . Pneumonia    hx of->15 yrs ago  . Protein-calorie malnutrition, severe w/ electrolyte imbalance    a. Severe hypoalbuminemia  leading to 3rd spacing including anasarca and pulm edema 07/2013.  Marland Kitchen SBO (small bowel obstruction) ([place])    a. Lysis of adhesions and ovarian cystectomy 04/2013 for sBO. b. Admitted 07/2013 for colonic obstruction due to suspected colitis, s/p partial colectomy/colostomy 08/01/13. Admission complicated by anasarca, acute resp failure requiring tracheostomy, decannulated 08/25/13. c. Recurrent SBO8/2015, NGT placed for decompression.   . Stroke [facility]) early 80's   right sided weakness--TIA    Past Surgical History:  Procedure Laterality Date  . ABDOMINAL HYSTERECTOMY    . APPENDECTOMY    . BOWEL RESECTION N/A 08/01/2013   Procedure: SMALL BOWEL RESECTION;  Surgeon: Harl Bowie, MD;  Location: [facility];  Service: General;   Laterality: N/A;  . cataract surgery Bilateral   . CHOLECYSTECTOMY    . COLON RESECTION    . COLON SURGERY    . COLOSTOMY N/A 08/01/2013   Procedure: COLOSTOMY;  Surgeon: Harl Bowie, MD;  Location: [facility];  Service: General;  Laterality: N/A;  . COLOSTOMY TAKEDOWN  01/24/2014   dr blackman  . COLOSTOMY TAKEDOWN N/A 01/24/2014   Procedure: COLOSTOMY TAKEDOWN;  Surgeon: Coralie Keens, MD;  Location: [place];  Service: General;  Laterality: N/A;  . CORONARY ANGIOPLASTY WITH STENT PLACEMENT  12/20/2012   STENT TO OM         DR COOPER  . ENDARTERECTOMY Right 08/08/2014   Procedure: RIGHT CAROTID ENDARTERECTOMY WITH HEMASHIELD PATCH ANGIOPLASTY;  Surgeon: Elam Dutch, MD;  Location: [facility];  Service: Vascular;  Laterality: Right;  . FOOT SURGERY Left   . HAND SURGERY Bilateral    for ganglion cysts  . LAPAROTOMY N/A 08/01/2013   Procedure: EXPLORATORY LAPAROTOMY;  Surgeon: Harl Bowie, MD;  Location: [place];  Service: General;  Laterality: N/A;  . LEFT HEART CATH AND CORONARY ANGIOGRAPHY N/A 01/10/2019   Procedure: LEFT HEART CATH AND CORONARY ANGIOGRAPHY;  Surgeon: Troy Sine, MD;  Location: [facility];  Service: Cardiovascular;  Laterality: N/A;  . LEFT HEART CATHETERIZATION WITH CORONARY ANGIOGRAM N/A 12/20/2012   Procedure: LEFT HEART CATHETERIZATION WITH CORONARY ANGIOGRAM;  Surgeon: Blane Ohara, MD;  Location: [facility] CATH LAB;  Service: Cardiovascular;  Laterality: N/A;  . PARTIAL COLECTOMY N/A 08/01/2013   Procedure: PARTIAL COLECTOMY;  Surgeon: Harl Bowie, MD;  Location: [facility];  Service: General;  Laterality: N/A;  . ROTATOR CUFF REPAIR Left   . TONSILLECTOMY    . TRACHEOSTOMY     [place]    Current Medications: No outpatient medications have been marked as taking for the 05/11/20 encounter (Appointment) with Freada Bergeron, MD.     Allergies:   Pregabalin and Sulfonamide derivatives   Social History   Socioeconomic History  . Marital  status: Married    Spouse name: Not on file  . Number of children: Not on file  . Years of education: Not on file  . Highest education level: Not on file  Occupational History  . Occupation: full time  Tobacco Use  . Smoking status: Current Some Day Smoker    Packs/day: 0.25    Years: 53.00    Pack years: 13.25    Types: Cigarettes  . Smokeless tobacco: Never Used  . Tobacco comment: also uses e cig, 4 A DAY  Vaping Use  . Vaping Use: Never used  Substance and Sexual Activity  . Alcohol use: No    Alcohol/week: 0.0 standard drinks  . Drug use: No  . Sexual activity: Not Currently  Birth control/protection: Surgical  Other Topics Concern  . Not on file  Social History Narrative  . Not on file   Social Determinants of Health   Financial Resource Strain: Not on file  Food Insecurity: Not on file  Transportation Needs: Not on file  Physical Activity: Not on file  Stress: Not on file  Social Connections: Not on file     Family History: The patient's ***family history includes AAA (abdominal aortic aneurysm) in her father; Heart attack in her father; Heart disease in her father and another family member; Varicose Veins in her mother.  ROS:   Please see the history of present illness.    *** All other systems reviewed and are negative.  EKGs/Labs/Other Studies Reviewed:    The following studies were reviewed today: Cath 01/10/19:    Previously placed Mid Cx to Dist Cx stent (unknown type) is widely patent.  Mid Cx lesion is 30% stenosed.  2nd Mrg-1 lesion is 40% stenosed.  Previously placed 2nd Mrg-2 stent (unknown type) is widely patent.  2nd Mrg-3 lesion is 100% stenosed.  Prox RCA lesion is 30% stenosed.  The left ventricular systolic function is normal.  LV end diastolic pressure is mildly elevated.   Non-ST segment elevation myocardial infarction secondary to distal total occlusion of the obtuse marginal branch of the left circumflex coronary  artery,  Normal left main, LAD and mild nonobstructive 30% narrowing in the RCA which supplies the posterior wall.  The left circumflex vessel has 40% ostial stenosis in the OM 2 vessel with a widely patent stent in the midportion of this marginal branch.  The distal marginal is 100% occluded.  There is faint collateralization from the LAD to the distal vessel which is small caliber; the AV groove circumflex has 30% narrowing in the stent in the AV groove circumflex is widely patent.  Serve global LV function with EF estimated 55%.  There is a subtle focal area of distal inferior hypocontractility.  RECOMMENDATION: The patient's ECG is entirely normal.  She is not having any recurrent chest pain.  Her MI was over 48 hours ago.  With her stability and distal occlusion recommend medical management.  Consider DAPT; however, will need to obtain additional information with her history of prior stroke following carotid endarterectomy remotely.  Alternatively aspirin alone and medical therapy for her current obstructive disease.  Smoking cessation is essential.  Aggressive lipid-lowering therapy with target LDL less than 70.  TTE 01/09/19: IMPRESSIONS    1. Left ventricular ejection fraction, by visual estimation, is 60 to  65%. The left ventricle has normal function. There is borderline left  ventricular hypertrophy.  2. Left ventricular diastolic parameters are consistent with Grade II  diastolic dysfunction (pseudonormalization).  3. The left ventricle has no regional wall motion abnormalities.  4. Global right ventricle was not well visualized.The right ventricular  size is normal. No increase in right ventricular wall thickness.  5. Left atrial size was mild-moderately dilated.  6. Right atrial size was normal.  7. Presence of pericardial fat pad.  8. Small pericardial effusion.  9. The mitral valve is rheumatic. Trivial mitral valve regurgitation.  Mild mitral stenosis.  10.  The tricuspid valve is normal in structure. Tricuspid valve  regurgitation is trivial.  11. The aortic valve is tricuspid. Aortic valve regurgitation is not  visualized. No evidence of aortic valve sclerosis or stenosis.  12. Pulmonic regurgitation is mild.  13. The pulmonic valve was grossly normal. Pulmonic valve regurgitation is  mild.   Zio [id number] 10/2018:  Sinus rhythm to sinus tachycardia.  Frequent nsVTs with maximum rate 177 BPM, the longest lasting 12 seconds.  Frequent short runs of SVTs.   Sinus rhythm to sinus tachycardia. Frequent nsVTs with maximum rate 177 BPM, the longest lasting 12 seconds.  Patients symptoms didn't correlate with significant arrhythmias. Echocardiogram and referral to EP is recommended.   EKG:  EKG is *** ordered today.  The ekg ordered today demonstrates ***  Recent Labs: No results found for requested labs within last 8760 hours.  Recent Lipid Panel    Component Value Date/Time   CHOL 117 01/09/2019 0053   CHOL 150 01/30/2016 1052   TRIG 86 01/09/2019 0053   HDL 42 01/09/2019 0053   HDL 38 (L) 01/30/2016 1052   CHOLHDL 2.8 01/09/2019 0053   VLDL 17 01/09/2019 0053   LDLCALC 58 01/09/2019 0053   LDLCALC 84 01/30/2016 1052     Risk Assessment/Calculations:   {Does this patient have ATRIAL FIBRILLATION?[phone]}   Physical Exam:    VS:  There were no vitals taken for this visit.    Wt Readings from Last 3 Encounters:  01/23/20 167 lb (75.8 kg)  09/28/19 163 lb 1.6 oz (74 kg)  09/27/19 162 lb 3.2 oz (73.6 kg)     GEN: *** Well nourished, well developed in no acute distress HEENT: Normal NECK: No JVD; No carotid bruits LYMPHATICS: No lymphadenopathy CARDIAC: ***RRR, no murmurs, rubs, gallops RESPIRATORY:  Clear to auscultation without rales, wheezing or rhonchi  ABDOMEN: Soft, non-tender, non-distended MUSCULOSKELETAL:  No edema; No deformity  SKIN: Warm and dry NEUROLOGIC:  Alert and oriented x 3 PSYCHIATRIC:   Normal affect   ASSESSMENT:    No diagnosis found. PLAN:    In order of problems listed above:  #CAD s/p PCI to LCx and OM2 Catj 2020 with occluded OM with collaterals from LAD. No PCI performed at that time. Now on medical management. -Continue ASA -Continue lipitor 80mg  daily -Continue metop 50mg  XL -? Add ACE?ARB  #Chronic diastolic HF: Euvolemic with NYHA class *** symptoms. -Continue metop 50mg  XL -Off diuretics -Add spiro??? -Low Na diet  #Carotid artery disease: s/p right carotid endarterectomy - complicated by stroke. Followed by Dr Oneida Alar. No bruit.  Most recent carotid ultrasound in July 2020 showed stable 40 to 60% stenosis bilaterally.  #HTN: -Continue metop as above  #HLD: -Continue lipitor   {Are you ordering a CV Procedure (e.g. stress test, cath, DCCV, TEE, etc)?   Press F2        :[id number]}    Medication Adjustments/Labs and Tests Ordered: Current medicines are reviewed at length with the patient today.  Concerns regarding medicines are outlined above.  No orders of the defined types were placed in this encounter.  No orders of the defined types were placed in this encounter.   There are no Patient Instructions on file for this visit.   Signed, Freada Bergeron, MD  05/05/2020 9:32 PM    [place]

## 2020-05-11 ENCOUNTER — Ambulatory Visit: Payer: Medicare Other | Admitting: Cardiology

## 2020-05-11 ENCOUNTER — Other Ambulatory Visit: Payer: Self-pay

## 2020-05-11 ENCOUNTER — Encounter: Payer: Self-pay | Admitting: Cardiology

## 2020-05-11 VITALS — BP 114/70 | HR 87 | Ht 59.0 in | Wt 146.8 lb

## 2020-05-11 DIAGNOSIS — I214 Non-ST elevation (NSTEMI) myocardial infarction: Secondary | ICD-10-CM | POA: Diagnosis not present

## 2020-05-11 DIAGNOSIS — I6523 Occlusion and stenosis of bilateral carotid arteries: Secondary | ICD-10-CM | POA: Diagnosis not present

## 2020-05-11 DIAGNOSIS — E782 Mixed hyperlipidemia: Secondary | ICD-10-CM | POA: Diagnosis not present

## 2020-05-11 DIAGNOSIS — Z122 Encounter for screening for malignant neoplasm of respiratory organs: Secondary | ICD-10-CM | POA: Diagnosis not present

## 2020-05-11 DIAGNOSIS — I2510 Atherosclerotic heart disease of native coronary artery without angina pectoris: Secondary | ICD-10-CM

## 2020-05-11 DIAGNOSIS — M545 Low back pain, unspecified: Secondary | ICD-10-CM

## 2020-05-11 DIAGNOSIS — F17200 Nicotine dependence, unspecified, uncomplicated: Secondary | ICD-10-CM

## 2020-05-11 DIAGNOSIS — I10 Essential (primary) hypertension: Secondary | ICD-10-CM | POA: Diagnosis not present

## 2020-05-11 NOTE — Patient Instructions (Addendum)
Medication Instructions:   Your physician recommends that you continue on your current medications as directed. Please refer to the Current Medication list given to you today.  *If you need a refill on your cardiac medications before your next appointment, please call your pharmacy*   You have been referred to [place], FOR LOW BACK PAIN   Testing/Procedures:  CT CHEST WITHOUT CONTRAST TO BE DONE HERE IN THE OFFICE   Follow-Up: At [facility], you and your health needs are our priority.  As part of our continuing mission to provide you with exceptional heart care, we have created designated Provider Care Teams.  These Care Teams include your primary Cardiologist (physician) and Advanced Practice Providers (APPs -  Physician Assistants and Nurse Practitioners) who all work together to provide you with the care you need, when you need it.  We recommend signing up for the patient portal called "MyChart".  Sign up information is provided on this After Visit Summary.  MyChart is used to connect with patients for Virtual Visits (Telemedicine).  Patients are able to view lab/test results, encounter notes, upcoming appointments, etc.  Non-urgent messages can be sent to your provider as well.   To learn more about what you can do with MyChart, go to [email or web address].    Your next appointment:   6 month(s)  The format for your next appointment:   In Person  Provider:   You will see one of the following Advanced Practice Providers on your designated Care Team:    Richardson Dopp, PA-C  [place]in [place], [place]

## 2020-05-11 NOTE — Progress Notes (Signed)
Cardiology Office Note:    Date:  05/11/2020   ID:  [name], DOB 1940-02-21, MRN [id number]  PCP:  [name], MD   [place]  Cardiologist:  Ena Dawley, MD  Advanced Practice Provider:  No care team member to display Electrophysiologist:  Will Meredith Leeds, MD    Referring MD: [name], MD     History of Present Illness:    [name] is a 80 y.o. female with a hx of CAD s/p PCI to OM1, HTN, HLD, and PAD who was previously followed by Dr. Meda Coffee now returning to clinic for follow-up.  Per review of the record, the patient underwent a cardiac cath on 12/20/2012 with finding of severe stenosis in OM1 and received 2 DES. In May 2015 had a SBO and required partial colon resection. She underwent right carotid endarterectomy on 33/54/5625 that was complicated by postop stroke in the recovery room. This resulted in a left hemiplegia. She was hospitalized in December 2020 with retrosternal chest pressure, and was diagnosed with non-STEMI. High-sensitivity troponin was up to 23,000. She underwent cardiac catheterization that showed distal total occlusion of the obtuse marginal branch of the left circumflex coronary artery.  There was faint collateralization from the LAD.  Her LVEF was 55 to 60%. No PCI performed and patient was continued on medical management.  Today, she is accompanied by her husband. She is feeling unwell due to back pain which has been ongoing for several months. She has not seen anyone for it yet and is interested in a referral. She states she does not want surgery or to do PT. She is hoping to have injection therapy like her sister did.   Otherwise, she is doing well from a cardiac standpoint. Denies any chest discomfort, SOB, LE edema, dizziness, or focal weakness. She is tolerating her current medications. Her blood pressure at home is well-controlled at this time. Her last catheterization was in 2020  where she presented with chest pain. Since then, she has had no similar symptoms.   Has 65 pack year smoking history.   Past Medical History:  Diagnosis Date  . Acute respiratory failure with hypoxia s/p tracheostomy 07/2013  . Anemia    a. 7-08/2013 felt due to recent critical illness/chronic disease.  . Arthritis    handds, knees & back   . Barrett's esophagus   . CAD (coronary artery disease)    a. Mod dz 2010 initially mgd medically. b. 12/2012 - angina s/p PTCA/DES to mid-circumflex, PTCA/DES to first OM.   Marland Kitchen Carotid artery disease ([place])    a. 60-70% bilat ICA stenosis by dopplers 05/2012.  Marland Kitchen Chronic back pain    deteriorating;DDD  . Colonic obstruction due to suspected colitis; s/p colectomy/colostomy    a. See SBO.  Marland Kitchen Complication of anesthesia    hard to wake up from anesthesia   . COPD (chronic obstructive pulmonary disease) ([place])   . Depression    takes Remeron and Zoloft daily  . Dizziness    was on Bentyl which caused this-took off of it and no problems since  . DVT of axillary vein, acute right ([place])    a. Dx 07/2013 felt due to R IJ central line that had been inserted 7/14. Not on anticoag due to GIB issues and small cerebral bleed.  Marland Kitchen GERD (gastroesophageal reflux disease)   . Heart murmur   . History of bronchitis    > 91yrs ago   .  History of colon polyps   . History of hiatal hernia   . History of kidney stones    was told it was stable but doesn't know for sure that she ever passed it  . HTN (hypertension)   . Hypertension    takes Metoprolol daily  . Intracranial hemorrhage ([place])    a. 08/11/13 - per DC summary, tiny SAH vs SDH done for altered mentation, anticoag stopped including aspirin.  . Joint pain   . Neck pain    DDD  . Other and unspecified hyperlipidemia    takes Lipitor daily  . Pneumonia    hx of->15 yrs ago  . Protein-calorie malnutrition, severe w/ electrolyte imbalance    a. Severe hypoalbuminemia leading to 3rd spacing including  anasarca and pulm edema 07/2013.  Marland Kitchen SBO (small bowel obstruction) ([place])    a. Lysis of adhesions and ovarian cystectomy 04/2013 for sBO. b. Admitted 07/2013 for colonic obstruction due to suspected colitis, s/p partial colectomy/colostomy 08/01/13. Admission complicated by anasarca, acute resp failure requiring tracheostomy, decannulated 08/25/13. c. Recurrent SBO8/2015, NGT placed for decompression.   . Stroke [facility]) early 80's   right sided weakness--TIA    Past Surgical History:  Procedure Laterality Date  . ABDOMINAL HYSTERECTOMY    . APPENDECTOMY    . BOWEL RESECTION N/A 08/01/2013   Procedure: SMALL BOWEL RESECTION;  Surgeon: Harl Bowie, MD;  Location: [place];  Service: General;  Laterality: N/A;  . cataract surgery Bilateral   . CHOLECYSTECTOMY    . COLON RESECTION    . COLON SURGERY    . COLOSTOMY N/A 08/01/2013   Procedure: COLOSTOMY;  Surgeon: Harl Bowie, MD;  Location: [place];  Service: General;  Laterality: N/A;  . COLOSTOMY TAKEDOWN  01/24/2014   dr blackman  . COLOSTOMY TAKEDOWN N/A 01/24/2014   Procedure: COLOSTOMY TAKEDOWN;  Surgeon: Coralie Keens, MD;  Location: [place];  Service: General;  Laterality: N/A;  . CORONARY ANGIOPLASTY WITH STENT PLACEMENT  12/20/2012   STENT TO OM         DR COOPER  . ENDARTERECTOMY Right 08/08/2014   Procedure: RIGHT CAROTID ENDARTERECTOMY WITH HEMASHIELD PATCH ANGIOPLASTY;  Surgeon: Elam Dutch, MD;  Location: [place];  Service: Vascular;  Laterality: Right;  . FOOT SURGERY Left   . HAND SURGERY Bilateral    for ganglion cysts  . LAPAROTOMY N/A 08/01/2013   Procedure: EXPLORATORY LAPAROTOMY;  Surgeon: Harl Bowie, MD;  Location: [place];  Service: General;  Laterality: N/A;  . LEFT HEART CATH AND CORONARY ANGIOGRAPHY N/A 01/10/2019   Procedure: LEFT HEART CATH AND CORONARY ANGIOGRAPHY;  Surgeon: Troy Sine, MD;  Location: [place] CV LAB;  Service: Cardiovascular;  Laterality: N/A;  . LEFT HEART CATHETERIZATION WITH  CORONARY ANGIOGRAM N/A 12/20/2012   Procedure: LEFT HEART CATHETERIZATION WITH CORONARY ANGIOGRAM;  Surgeon: Blane Ohara, MD;  Location: [facility] CATH LAB;  Service: Cardiovascular;  Laterality: N/A;  . PARTIAL COLECTOMY N/A 08/01/2013   Procedure: PARTIAL COLECTOMY;  Surgeon: Harl Bowie, MD;  Location: [place];  Service: General;  Laterality: N/A;  . ROTATOR CUFF REPAIR Left   . TONSILLECTOMY    . TRACHEOSTOMY     feinstein    Current Medications: Current Meds  Medication Sig  . aspirin EC 81 MG tablet Take 162 mg by mouth daily.  Marland Kitchen atorvastatin (LIPITOR) 80 MG tablet Take 80 mg by mouth every evening.   Marland Kitchen buPROPion (WELLBUTRIN XL) 150 MG 24 hr tablet  Take 1 tablet (150 mg total) by mouth daily.  Marland Kitchen docusate sodium (COLACE) 100 MG capsule Take 100 mg by mouth as needed for mild constipation.   . fish oil-omega-3 fatty acids 1000 MG capsule Take 1,000 mg by mouth daily.  . fluticasone (FLONASE) 50 MCG/ACT nasal spray Place 1 spray into both nostrils daily as needed for allergies.  . folic acid (FOLVITE) 416 MCG tablet Take 800 mcg by mouth daily.  . metoprolol succinate (TOPROL-XL) 50 MG 24 hr tablet TAKE 1 TABLET BY MOUTH  DAILY  . mirtazapine (REMERON) 45 MG tablet Take 45 mg by mouth at bedtime.   . nicotine (NICODERM CQ - DOSED IN MG/24 HR) 7 mg/24hr patch Place 1 patch (7 mg total) onto the skin daily.  . nitroGLYCERIN (NITROSTAT) 0.4 MG SL tablet Place 1 tablet (0.4 mg total) under the tongue every 5 (five) minutes as needed for chest pain.  Marland Kitchen ondansetron (ZOFRAN) 4 MG tablet Take 4 mg by mouth every 8 (eight) hours as needed for nausea or vomiting.   Marland Kitchen oxyCODONE (OXY IR/ROXICODONE) 5 MG immediate release tablet Take 5 mg by mouth as needed.  . sertraline (ZOLOFT) 100 MG tablet Take 100 mg by mouth 2 (two) times daily.     Allergies:   Pregabalin and Sulfonamide derivatives   Social History   Socioeconomic History  . Marital status: Married    Spouse name: Not on file  .  Number of children: Not on file  . Years of education: Not on file  . Highest education level: Not on file  Occupational History  . Occupation: full time  Tobacco Use  . Smoking status: Current Some Day Smoker    Packs/day: 0.25    Years: 53.00    Pack years: 13.25    Types: Cigarettes  . Smokeless tobacco: Never Used  . Tobacco comment: also uses e cig, 4 A DAY  Vaping Use  . Vaping Use: Never used  Substance and Sexual Activity  . Alcohol use: No    Alcohol/week: 0.0 standard drinks  . Drug use: No  . Sexual activity: Not Currently    Birth control/protection: Surgical  Other Topics Concern  . Not on file  Social History Narrative  . Not on file   Social Determinants of Health   Financial Resource Strain: Not on file  Food Insecurity: Not on file  Transportation Needs: Not on file  Physical Activity: Not on file  Stress: Not on file  Social Connections: Not on file     Family History: The patient's family history includes AAA (abdominal aortic aneurysm) in her father; Heart attack in her father; Heart disease in her father and another family member; Varicose Veins in her mother.  ROS:   Please see the history of present illness.    Review of Systems  Constitutional: Negative for fever and malaise/fatigue.  HENT: Negative for ear pain and hearing loss.   Eyes: Negative for blurred vision.  Respiratory: Negative for cough and shortness of breath.   Cardiovascular: Negative for chest pain and palpitations.  Gastrointestinal: Negative for nausea and vomiting.  Genitourinary: Negative for flank pain.  Musculoskeletal: Positive for back pain. Negative for falls and myalgias.  Neurological: Negative for focal weakness and loss of consciousness.  Psychiatric/Behavioral: Negative for depression.     EKGs/Labs/Other Studies Reviewed:    The following studies were reviewed today: Cath 01/10/19:    Previously placed Mid Cx to Dist Cx stent (unknown type) is widely  patent.  Mid Cx lesion is 30% stenosed.  2nd Mrg-1 lesion is 40% stenosed.  Previously placed 2nd Mrg-2 stent (unknown type) is widely patent.  2nd Mrg-3 lesion is 100% stenosed.  Prox RCA lesion is 30% stenosed.  The left ventricular systolic function is normal.  LV end diastolic pressure is mildly elevated.   Non-ST segment elevation myocardial infarction secondary to distal total occlusion of the obtuse marginal branch of the left circumflex coronary artery,  Normal left main, LAD and mild nonobstructive 30% narrowing in the RCA which supplies the posterior wall.  The left circumflex vessel has 40% ostial stenosis in the OM 2 vessel with a widely patent stent in the midportion of this marginal branch.  The distal marginal is 100% occluded.  There is faint collateralization from the LAD to the distal vessel which is small caliber; the AV groove circumflex has 30% narrowing in the stent in the AV groove circumflex is widely patent.  Serve global LV function with EF estimated 55%.  There is a subtle focal area of distal inferior hypocontractility.  RECOMMENDATION: The patient's ECG is entirely normal.  She is not having any recurrent chest pain.  Her MI was over 48 hours ago.  With her stability and distal occlusion recommend medical management.  Consider DAPT; however, will need to obtain additional information with her history of prior stroke following carotid endarterectomy remotely.  Alternatively aspirin alone and medical therapy for her current obstructive disease.  Smoking cessation is essential.  Aggressive lipid-lowering therapy with target LDL less than 70.  TTE 01/09/19: IMPRESSIONS    1. Left ventricular ejection fraction, by visual estimation, is 60 to  65%. The left ventricle has normal function. There is borderline left  ventricular hypertrophy.  2. Left ventricular diastolic parameters are consistent with Grade II  diastolic dysfunction (pseudonormalization).   3. The left ventricle has no regional wall motion abnormalities.  4. Global right ventricle was not well visualized.The right ventricular  size is normal. No increase in right ventricular wall thickness.  5. Left atrial size was mild-moderately dilated.  6. Right atrial size was normal.  7. Presence of pericardial fat pad.  8. Small pericardial effusion.  9. The mitral valve is rheumatic. Trivial mitral valve regurgitation.  Mild mitral stenosis.  10. The tricuspid valve is normal in structure. Tricuspid valve  regurgitation is trivial.  11. The aortic valve is tricuspid. Aortic valve regurgitation is not  visualized. No evidence of aortic valve sclerosis or stenosis.  12. Pulmonic regurgitation is mild.  13. The pulmonic valve was grossly normal. Pulmonic valve regurgitation is  mild.   Zio N23FTDD 10/2018:  Sinus rhythm to sinus tachycardia.  Frequent nsVTs with maximum rate 177 BPM, the longest lasting 12 seconds.  Frequent short runs of SVTs.   Sinus rhythm to sinus tachycardia. Frequent nsVTs with maximum rate 177 BPM, the longest lasting 12 seconds.  Patients symptoms didn't correlate with significant arrhythmias. Echocardiogram and referral to EP is recommended.   EKG:   05/11/20: NSR, Rate: 87 bpm, isolated PAC   Recent Labs: No results found for requested labs within last 8760 hours.  Recent Lipid Panel    Component Value Date/Time   CHOL 117 01/09/2019 0053   CHOL 150 01/30/2016 1052   TRIG 86 01/09/2019 0053   HDL 42 01/09/2019 0053   HDL 38 (L) 01/30/2016 1052   CHOLHDL 2.8 01/09/2019 0053   VLDL 17 01/09/2019 0053   LDLCALC 58 01/09/2019 0053   LDLCALC 84 01/30/2016 1052  Risk Assessment/Calculations:       Physical Exam:    VS:  BP 114/70   Pulse 87   Ht 4\' 11"  (1.499 m)   Wt 146 lb 12.8 oz (66.6 kg)   SpO2 98%   BMI 29.65 kg/m     Wt Readings from Last 3 Encounters:  05/11/20 146 lb 12.8 oz (66.6 kg)  01/23/20 167 lb (75.8  kg)  09/28/19 163 lb 1.6 oz (74 kg)     GEN: Well nourished, well developed in no acute distress HEENT: Normal NECK: No JVD; No carotid bruits CARDIAC: RRR, no murmurs, rubs, gallops RESPIRATORY:  Clear to auscultation without rales, wheezing or rhonchi  ABDOMEN: Soft, non-tender, non-distended MUSCULOSKELETAL:  No edema; No deformity  SKIN: Warm and dry NEUROLOGIC:  Alert and oriented x 3 PSYCHIATRIC:  Normal affect   ASSESSMENT:    1. Smoking   2. Low back pain, unspecified back pain laterality, unspecified chronicity, unspecified whether sciatica present   3. Coronary artery disease involving native coronary artery of native heart without angina pectoris   4. Screening for lung cancer    PLAN:    In order of problems listed above:  #Coronary Artery Disease s/p PCI to LCx: Cath 2020 with occluded OM with collaterals from LAD. No PCI performed at that time. Now on medical management. No anginal symptoms.  -Continue ASA -Continue lipitor 80mg  daily -Continue metop 50mg  XL -Can consider ARB vs spiro at next visit but patient is concerned about dropping her blood pressure as has a history of dalls  #Chronic diastolic HF: Euvolemic with NYHA class II symptoms. Not on diuretics. -Continue metop 50mg  XL -Off diuretics -Can consider spiro at next visit pending blood pressures -Low Na diet  #Carotid artery disease: S/p right carotid endarterectomy - complicated by stroke. Followed by Dr Oneida Alar. No bruit.  Most recent carotid ultrasound in July 2020 showed stable 40 to 60% stenosis bilaterally. -Continue ASA and lipitor  #HTN: Well controlled.  -Continue metop as above  #HLD: -Continue lipitor  #Tobacco Use: -CT chest to monitor for lung cancer -Patient motivated to quit; currently trying the nicotine patches  #Low Back Pain: -Refer to Dr. Tamala Julian with [facility]  Medication Adjustments/Labs and Tests Ordered: Current medicines are reviewed at length with the patient  today.  Concerns regarding medicines are outlined above.  Orders Placed This Encounter  Procedures  . CT Chest Wo Contrast  . AMB referral to sports medicine  . EKG 12-Lead   No orders of the defined types were placed in this encounter.   Patient Instructions  Medication Instructions:   Your physician recommends that you continue on your current medications as directed. Please refer to the Current Medication list given to you today.  *If you need a refill on your cardiac medications before your next appointment, please call your pharmacy*   You have been referred to [facility], FOR LOW BACK PAIN   Testing/Procedures:  CT CHEST WITHOUT CONTRAST TO BE DONE HERE IN THE OFFICE   Follow-Up: At [facility], you and your health needs are our priority.  As part of our continuing mission to provide you with exceptional heart care, we have created designated Provider Care Teams.  These Care Teams include your primary Cardiologist (physician) and Advanced Practice Providers (APPs -  Physician Assistants and Nurse Practitioners) who all work together to provide you with the care you need, when you need it.  We recommend signing up for  the patient portal called "MyChart".  Sign up information is provided on this After Visit Summary.  MyChart is used to connect with patients for Virtual Visits (Telemedicine).  Patients are able to view lab/test results, encounter notes, upcoming appointments, etc.  Non-urgent messages can be sent to your provider as well.   To learn more about what you can do with MyChart, go to [email or web address].    Your next appointment:   6 month(s)  The format for your next appointment:   In Person  Provider:   You will see one of the following Advanced Practice Providers on your designated Care Team:    Richardson Dopp, PA-C  Vin Creston, PA-C          Follow-up in 6 months.  I,Mathew Stumpf,acting as a Education administrator  for Freada Bergeron, MD.,have documented all relevant documentation on the behalf of Freada Bergeron, MD,as directed by  Freada Bergeron, MD while in the presence of Freada Bergeron, MD.  I, Freada Bergeron, MD, have reviewed all documentation for this visit. The documentation on 05/11/20 for the exam, diagnosis, procedures, and orders are all accurate and complete.  Signed, Freada Bergeron, MD  05/11/2020 12:45 PM    [place]

## 2020-05-15 DIAGNOSIS — Z1231 Encounter for screening mammogram for malignant neoplasm of breast: Secondary | ICD-10-CM | POA: Diagnosis not present

## 2020-05-15 DIAGNOSIS — M85852 Other specified disorders of bone density and structure, left thigh: Secondary | ICD-10-CM | POA: Diagnosis not present

## 2020-05-15 DIAGNOSIS — M85831 Other specified disorders of bone density and structure, right forearm: Secondary | ICD-10-CM | POA: Diagnosis not present

## 2020-05-23 ENCOUNTER — Other Ambulatory Visit: Payer: Self-pay

## 2020-05-23 ENCOUNTER — Ambulatory Visit (INDEPENDENT_AMBULATORY_CARE_PROVIDER_SITE_OTHER)
Admission: RE | Admit: 2020-05-23 | Discharge: 2020-05-23 | Disposition: A | Discharge status: Home / Self Care | Payer: Medicare Other | Source: Ambulatory Visit | Attending: Cardiology | Admitting: Cardiology

## 2020-05-23 DIAGNOSIS — Z122 Encounter for screening for malignant neoplasm of respiratory organs: Secondary | ICD-10-CM

## 2020-05-23 DIAGNOSIS — J984 Other disorders of lung: Secondary | ICD-10-CM | POA: Diagnosis not present

## 2020-05-23 DIAGNOSIS — K449 Diaphragmatic hernia without obstruction or gangrene: Secondary | ICD-10-CM | POA: Diagnosis not present

## 2020-05-23 DIAGNOSIS — J439 Emphysema, unspecified: Secondary | ICD-10-CM

## 2020-05-23 DIAGNOSIS — I700 Atherosclerosis of aorta: Secondary | ICD-10-CM

## 2020-05-23 DIAGNOSIS — J438 Other emphysema: Secondary | ICD-10-CM | POA: Diagnosis not present

## 2020-05-23 DIAGNOSIS — F17200 Nicotine dependence, unspecified, uncomplicated: Secondary | ICD-10-CM

## 2020-05-23 DIAGNOSIS — I2510 Atherosclerotic heart disease of native coronary artery without angina pectoris: Secondary | ICD-10-CM | POA: Diagnosis not present

## 2020-05-29 ENCOUNTER — Telehealth: Payer: Self-pay | Admitting: *Deleted

## 2020-05-29 DIAGNOSIS — R9389 Abnormal findings on diagnostic imaging of other specified body structures: Secondary | ICD-10-CM

## 2020-05-29 DIAGNOSIS — R918 Other nonspecific abnormal finding of lung field: Secondary | ICD-10-CM

## 2020-05-29 NOTE — Telephone Encounter (Signed)
-----   Message from Para March, RN sent at 05/28/2020  3:28 PM EDT -----  ----- Message ----- From: Freada Bergeron, MD Sent: 05/28/2020   7:49 AM EDT To: [address] Triage  Her CT scan of her chest shows suspicious nodules that may be cancerous that we really need to work-up further. Can we refer her to [facility] as soon as possible to start the work-up? Thanks so much!

## 2020-05-29 NOTE — Telephone Encounter (Signed)
Pt made aware of Chest CT results and recommendations per Dr. Johney Frame, for her to be referred to Pulmonology for abnormal CT and suspicious nodules noted on this image.  Referral was placed for urgent, or asap to Pulmonology, as advised by Dr. Johney Frame.  Pt is aware that I will place the referral in the system and send a message to our [facility] Schedulers to call her back and help coordinate this appt with [facility].  Pt verbalized understanding and agrees with this plan.

## 2020-06-04 ENCOUNTER — Other Ambulatory Visit: Payer: Self-pay | Admitting: Cardiovascular Disease

## 2020-06-18 NOTE — Progress Notes (Signed)
[place] [facility] [place] Phone: [phone] Subjective:   [name], am serving as a scribe for Dr. Hulan Saas. This visit occurred during the SARS-CoV-2 public health emergency.  Safety protocols were in place, including screening questions prior to the visit, additional usage of staff PPE, and extensive cleaning of exam room while observing appropriate contact time as indicated for disinfecting solutions.   I'm seeing this patient by the request  of:  Pemberton MD  CC: Back pain, neck pain  CHE:[id number]  [name] is a 80 y.o. female coming in with complaint of back pain. Patient states that her neck and lower back have been bothering her for years.   Pain in neck radiates down into shoulder blades. Patient has headaches 2-3x a week. No surgery or epidural history. Using topical analgesics and Tylenol for pain relief.  Avoids anti-inflammatories.  Patient also has lower back pain. Having hard time getting up from seated position and with lumbar flexion. Pain radiates into glutes but sometimes her legs do hurt. Does try to stretch legs to alleviate pain.  Patient states that her husband does need her to help her on a fairly regular basis.  History of stroke and heart attack a few years ago.  Feels like that was the start of everything.    Ct lumbar- DDD with facet hypertrophay and OA from 2014   Recent Ct chest shows right sided pulmonary nodule likely neoplastic and also left main stem bronchus nodularity Seeing pulmonary 06/21/20    Past Medical History:  Diagnosis Date  . Acute respiratory failure with hypoxia s/p tracheostomy 07/2013  . Anemia    a. 7-08/2013 felt due to recent critical illness/chronic disease.  . Arthritis    handds, knees & back   . Barrett's esophagus   . CAD (coronary artery disease)    a. Mod dz 2010 initially mgd medically. b. 12/2012 - angina s/p PTCA/DES to mid-circumflex,  PTCA/DES to first OM.   Marland Kitchen Carotid artery disease ([place])    a. 60-70% bilat ICA stenosis by dopplers 05/2012.  Marland Kitchen Chronic back pain    deteriorating;DDD  . Colonic obstruction due to suspected colitis; s/p colectomy/colostomy    a. See SBO.  Marland Kitchen Complication of anesthesia    hard to wake up from anesthesia   . COPD (chronic obstructive pulmonary disease) ([place])   . Depression    takes Remeron and Zoloft daily  . Dizziness    was on Bentyl which caused this-took off of it and no problems since  . DVT of axillary vein, acute right ([place])    a. Dx 07/2013 felt due to R IJ central line that had been inserted 7/14. Not on anticoag due to GIB issues and small cerebral bleed.  Marland Kitchen GERD (gastroesophageal reflux disease)   . Heart murmur   . History of bronchitis    > 37yrs ago   . History of colon polyps   . History of hiatal hernia   . History of kidney stones    was told it was stable but doesn't know for sure that she ever passed it  . HTN (hypertension)   . Hypertension    takes Metoprolol daily  . Intracranial hemorrhage ([place])    a. 08/11/13 - per DC summary, tiny SAH vs SDH done for altered mentation, anticoag stopped including aspirin.  . Joint pain   . Neck pain    DDD  . Other and unspecified  hyperlipidemia    takes Lipitor daily  . Pneumonia    hx of->15 yrs ago  . Protein-calorie malnutrition, severe w/ electrolyte imbalance    a. Severe hypoalbuminemia leading to 3rd spacing including anasarca and pulm edema 07/2013.  Marland Kitchen SBO (small bowel obstruction) ([place])    a. Lysis of adhesions and ovarian cystectomy 04/2013 for sBO. b. Admitted 07/2013 for colonic obstruction due to suspected colitis, s/p partial colectomy/colostomy 08/01/13. Admission complicated by anasarca, acute resp failure requiring tracheostomy, decannulated 08/25/13. c. Recurrent SBO8/2015, NGT placed for decompression.   . Stroke [facility]) early 80's   right sided weakness--TIA   Past Surgical History:  Procedure Laterality Date   . ABDOMINAL HYSTERECTOMY    . APPENDECTOMY    . BOWEL RESECTION N/A 08/01/2013   Procedure: SMALL BOWEL RESECTION;  Surgeon: Harl Bowie, MD;  Location: [place];  Service: General;  Laterality: N/A;  . cataract surgery Bilateral   . CHOLECYSTECTOMY    . COLON RESECTION    . COLON SURGERY    . COLOSTOMY N/A 08/01/2013   Procedure: COLOSTOMY;  Surgeon: Harl Bowie, MD;  Location: [place];  Service: General;  Laterality: N/A;  . COLOSTOMY TAKEDOWN  01/24/2014   dr blackman  . COLOSTOMY TAKEDOWN N/A 01/24/2014   Procedure: COLOSTOMY TAKEDOWN;  Surgeon: Coralie Keens, MD;  Location: [place];  Service: General;  Laterality: N/A;  . CORONARY ANGIOPLASTY WITH STENT PLACEMENT  12/20/2012   STENT TO OM         DR COOPER  . ENDARTERECTOMY Right 08/08/2014   Procedure: RIGHT CAROTID ENDARTERECTOMY WITH HEMASHIELD PATCH ANGIOPLASTY;  Surgeon: Elam Dutch, MD;  Location: [place];  Service: Vascular;  Laterality: Right;  . FOOT SURGERY Left   . HAND SURGERY Bilateral    for ganglion cysts  . LAPAROTOMY N/A 08/01/2013   Procedure: EXPLORATORY LAPAROTOMY;  Surgeon: Harl Bowie, MD;  Location: [place];  Service: General;  Laterality: N/A;  . LEFT HEART CATH AND CORONARY ANGIOGRAPHY N/A 01/10/2019   Procedure: LEFT HEART CATH AND CORONARY ANGIOGRAPHY;  Surgeon: Troy Sine, MD;  Location: [place] CV LAB;  Service: Cardiovascular;  Laterality: N/A;  . LEFT HEART CATHETERIZATION WITH CORONARY ANGIOGRAM N/A 12/20/2012   Procedure: LEFT HEART CATHETERIZATION WITH CORONARY ANGIOGRAM;  Surgeon: Blane Ohara, MD;  Location: [facility] CATH LAB;  Service: Cardiovascular;  Laterality: N/A;  . PARTIAL COLECTOMY N/A 08/01/2013   Procedure: PARTIAL COLECTOMY;  Surgeon: Harl Bowie, MD;  Location: [place];  Service: General;  Laterality: N/A;  . ROTATOR CUFF REPAIR Left   . TONSILLECTOMY    . TRACHEOSTOMY     feinstein   Social History   Socioeconomic History  . Marital status: Married     Spouse name: Not on file  . Number of children: Not on file  . Years of education: Not on file  . Highest education level: Not on file  Occupational History  . Occupation: full time  Tobacco Use  . Smoking status: Current Some Day Smoker    Packs/day: 0.25    Years: 53.00    Pack years: 13.25    Types: Cigarettes  . Smokeless tobacco: Never Used  . Tobacco comment: also uses e cig, 4 A DAY  Vaping Use  . Vaping Use: Never used  Substance and Sexual Activity  . Alcohol use: No    Alcohol/week: 0.0 standard drinks  . Drug use: No  . Sexual activity: Not Currently    Birth  control/protection: Surgical  Other Topics Concern  . Not on file  Social History Narrative  . Not on file   Social Determinants of Health   Financial Resource Strain: Not on file  Food Insecurity: Not on file  Transportation Needs: Not on file  Physical Activity: Not on file  Stress: Not on file  Social Connections: Not on file   Allergies  Allergen Reactions  . Pregabalin Swelling    Tongue swelling  . Sulfonamide Derivatives Swelling    Childhood reaction   Family History  Problem Relation Age of Onset  . Varicose Veins Mother   . Heart disease Father   . Heart attack Father   . AAA (abdominal aortic aneurysm) Father   . Heart disease Other      Current Outpatient Medications (Cardiovascular):  .  atorvastatin (LIPITOR) 80 MG tablet, Take 80 mg by mouth every evening.  .  metoprolol succinate (TOPROL-XL) 50 MG 24 hr tablet, TAKE 1 TABLET BY MOUTH  DAILY .  nitroGLYCERIN (NITROSTAT) 0.4 MG SL tablet, DISSOLVE 1 TABLET UNDER THE TONGUE EVERY 5 MINUTES AS  NEEDED FOR CHEST PAIN. MAX  OF 3 TABLETS IN 15 MINUTES. CALL 911 IF PAIN PERSISTS.  Current Outpatient Medications (Respiratory):  .  fluticasone (FLONASE) 50 MCG/ACT nasal spray, Place 1 spray into both nostrils daily as needed for allergies.  Current Outpatient Medications (Analgesics):  .  aspirin EC 81 MG tablet, Take 162 mg by mouth  daily. Marland Kitchen  oxyCODONE (OXY IR/ROXICODONE) 5 MG immediate release tablet, Take 5 mg by mouth as needed.  Current Outpatient Medications (Hematological):  .  folic acid (FOLVITE) 086 MCG tablet, Take 800 mcg by mouth daily.  Current Outpatient Medications (Other):  Marland Kitchen  buPROPion (WELLBUTRIN XL) 150 MG 24 hr tablet, Take 1 tablet (150 mg total) by mouth daily. Marland Kitchen  docusate sodium (COLACE) 100 MG capsule, Take 100 mg by mouth as needed for mild constipation.  .  fish oil-omega-3 fatty acids 1000 MG capsule, Take 1,000 mg by mouth daily. .  mirtazapine (REMERON) 45 MG tablet, Take 45 mg by mouth at bedtime.  .  nicotine (NICODERM CQ - DOSED IN MG/24 HR) 7 mg/24hr patch, Place 1 patch (7 mg total) onto the skin daily. .  ondansetron (ZOFRAN) 4 MG tablet, Take 4 mg by mouth every 8 (eight) hours as needed for nausea or vomiting.  .  sertraline (ZOLOFT) 100 MG tablet, Take 100 mg by mouth 2 (two) times daily.   Reviewed prior external information including notes and imaging from  primary care provider As well as notes that were available from care everywhere and other healthcare systems.  Past medical history, social, surgical and family history all reviewed in electronic medical record.  No pertanent information unless stated regarding to the chief complaint.   Review of Systems:  No headache, visual changes, nausea, vomiting, diarrhea, constipation, dizziness, abdominal pain, skin rash, fevers, chills, night sweats, weight loss, swollen lymph nodes,, joint swelling, chest pain, shortness of breath, mood changes. POSITIVE muscle aches, body aches  Objective  Blood pressure 120/60, pulse (!) 54, height 4\' 11"  (1.499 m), weight 149 lb (67.6 kg), SpO2 96 %.   General: No apparent distress alert and oriented x3 mood and affect normal, dressed appropriately.  Patient is accompanied with spouse. HEENT: Pupils equal, extraocular movements intact  Respiratory: Patient's speak in full sentences and does  not appear short of breath  Cardiovascular: No lower extremity edema, non tender, no erythema  Gait antalgic  walking with the aid of a cane and help from her significant other. Patient does have increasing kyphosis of the upper back.  Neck exam does have loss of lordosis.  Patient is tender to palpation diffusely.  Patient does have mild crepitus.  Patient does have a rigid thoracic spine noted.  Low back has significant tightness is noted as well.  Neurovascularly intact distally.  Tender to palpation mostly of the midline in the lumbar spine mostly around L3-L4.  Patient does have some pain over the sacroiliac joints bilaterally.  Patient does seem to have some mild weakness of the left lower extremity compared to the right but states that she has had that for some time status post stroke.  [id number]; 15 additional minutes spent for Therapeutic exercises as stated in above notes.  This included exercises focusing on stretching, strengthening, with significant focus on eccentric aspects.   Long term goals include an improvement in range of motion, strength, endurance as well as avoiding reinjury. Patient's frequency would include in 1-2 times a day, 3-5 times a week for a duration of 6-12 weeks. Low back exercises that included:  Pelvic tilt/bracing instruction to focus on control of the pelvic girdle and lower abdominal muscles  Glute strengthening exercises, focusing on proper firing of the glutes without engaging the low back muscles Proper stretching techniques for maximum relief for the hamstrings, hip flexors, low back and some rotation where tolerated   Proper technique shown and discussed handout in great detail with ATC.  All questions were discussed and answered.     Impression and Recommendations:     The above documentation has been reviewed and is accurate and complete Lyndal Pulley, DO

## 2020-06-19 ENCOUNTER — Ambulatory Visit (INDEPENDENT_AMBULATORY_CARE_PROVIDER_SITE_OTHER): Payer: Medicare Other

## 2020-06-19 ENCOUNTER — Ambulatory Visit: Payer: Medicare Other | Admitting: Family Medicine

## 2020-06-19 ENCOUNTER — Encounter: Payer: Self-pay | Admitting: Family Medicine

## 2020-06-19 ENCOUNTER — Other Ambulatory Visit: Payer: Self-pay

## 2020-06-19 ENCOUNTER — Other Ambulatory Visit: Payer: Self-pay | Admitting: Family Medicine

## 2020-06-19 VITALS — BP 120/60 | HR 54 | Ht 59.0 in | Wt 149.0 lb

## 2020-06-19 DIAGNOSIS — M546 Pain in thoracic spine: Secondary | ICD-10-CM | POA: Diagnosis not present

## 2020-06-19 DIAGNOSIS — M542 Cervicalgia: Secondary | ICD-10-CM

## 2020-06-19 DIAGNOSIS — M47814 Spondylosis without myelopathy or radiculopathy, thoracic region: Secondary | ICD-10-CM | POA: Diagnosis not present

## 2020-06-19 DIAGNOSIS — M25552 Pain in left hip: Secondary | ICD-10-CM

## 2020-06-19 DIAGNOSIS — M545 Low back pain, unspecified: Secondary | ICD-10-CM

## 2020-06-19 DIAGNOSIS — R5381 Other malaise: Secondary | ICD-10-CM

## 2020-06-19 DIAGNOSIS — M2550 Pain in unspecified joint: Secondary | ICD-10-CM

## 2020-06-19 DIAGNOSIS — M5030 Other cervical disc degeneration, unspecified cervical region: Secondary | ICD-10-CM | POA: Diagnosis not present

## 2020-06-19 DIAGNOSIS — M47816 Spondylosis without myelopathy or radiculopathy, lumbar region: Secondary | ICD-10-CM | POA: Diagnosis not present

## 2020-06-19 DIAGNOSIS — M25551 Pain in right hip: Secondary | ICD-10-CM

## 2020-06-19 DIAGNOSIS — M5136 Other intervertebral disc degeneration, lumbar region: Secondary | ICD-10-CM | POA: Insufficient documentation

## 2020-06-19 LAB — CBC WITH DIFFERENTIAL/PLATELET
Basophils Absolute: 0 10*3/uL (ref 0.0–0.1)
Basophils Relative: 0.5 % (ref 0.0–3.0)
Eosinophils Absolute: 0.2 10*3/uL (ref 0.0–0.7)
Eosinophils Relative: 2.8 % (ref 0.0–5.0)
HCT: 34.1 % — ABNORMAL LOW (ref 36.0–46.0)
Hemoglobin: 11.3 g/dL — ABNORMAL LOW (ref 12.0–15.0)
Lymphocytes Relative: 34.1 % (ref 12.0–46.0)
Lymphs Abs: 2.8 10*3/uL (ref 0.7–4.0)
MCHC: 33 g/dL (ref 30.0–36.0)
MCV: 84.5 fl (ref 78.0–100.0)
Monocytes Absolute: 0.6 10*3/uL (ref 0.1–1.0)
Monocytes Relative: 7.4 % (ref 3.0–12.0)
Neutro Abs: 4.6 10*3/uL (ref 1.4–7.7)
Neutrophils Relative %: 55.2 % (ref 43.0–77.0)
Platelets: 229 10*3/uL (ref 150.0–400.0)
RBC: 4.04 Mil/uL (ref 3.87–5.11)
RDW: 16.2 % — ABNORMAL HIGH (ref 11.5–15.5)
WBC: 8.3 10*3/uL (ref 4.0–10.5)

## 2020-06-19 LAB — COMPREHENSIVE METABOLIC PANEL
ALT: 12 U/L (ref 0–35)
AST: 15 U/L (ref 0–37)
Albumin: 4.1 g/dL (ref 3.5–5.2)
Alkaline Phosphatase: 102 U/L (ref 39–117)
BUN: 15 mg/dL (ref 6–23)
CO2: 27 mEq/L (ref 19–32)
Calcium: 9.9 mg/dL (ref 8.4–10.5)
Chloride: 107 mEq/L (ref 96–112)
Creatinine, Ser: 0.98 mg/dL (ref 0.40–1.20)
GFR: 54.18 mL/min — ABNORMAL LOW (ref >60.00)
Glucose, Bld: 103 mg/dL — ABNORMAL HIGH (ref 70–99)
Potassium: 4.3 mEq/L (ref 3.5–5.1)
Sodium: 142 mEq/L (ref 135–145)
Total Bilirubin: 0.5 mg/dL (ref 0.2–1.2)
Total Protein: 6.6 g/dL (ref 6.0–8.3)

## 2020-06-19 LAB — SEDIMENTATION RATE: Sed Rate: 25 mm/hr (ref 0–30)

## 2020-06-19 LAB — VITAMIN D 25 HYDROXY (VIT D DEFICIENCY, FRACTURES): VITD: 14.95 ng/mL — ABNORMAL LOW (ref 30.00–100.00)

## 2020-06-19 LAB — IBC PANEL
Iron: 39 ug/dL — ABNORMAL LOW (ref 42–145)
Saturation Ratios: 7.7 % — ABNORMAL LOW (ref 20.0–50.0)
Transferrin: 360 mg/dL (ref 212.0–360.0)

## 2020-06-19 LAB — TSH: TSH: 1.3 u[IU]/mL (ref 0.35–4.50)

## 2020-06-19 LAB — C-REACTIVE PROTEIN: CRP: <1 mg/dL (ref 0.5–20.0)

## 2020-06-19 NOTE — Assessment & Plan Note (Addendum)
I believe the patient is going to have fairly significant degenerative disc disease of the lumbar spine.  We discussed potential formal physical therapy which patient declined.  X-rays were taken today and independently visualized by me of the lumbar, thoracic and cervical that did not show any true mass but patient does have significant arthritic changes in multiple levels.  We discussed with patient having the nodule about something such as a bone scan or possible MRI would be necessary.  Patient wants to try the home exercises to see how patient responds.  I would like to keep a close eye on patient over and see her again in 4 weeks.  We will work with cardiology as well as her primary care and pulmonology to further evaluate and treat her appropriately.

## 2020-06-19 NOTE — Patient Instructions (Addendum)
Good to see you Labs have been ordered Tylenol 2x a day, one pill Vit D 2000IU daily Tart Cherry Extract 1200mg  daily See me again in 4 weeks

## 2020-06-19 NOTE — Assessment & Plan Note (Signed)
As stated with what we have seen in the thoracic area in the lumbar spine patient does have arthritic changes of the neck as well.  Patient declined formal physical therapy.  We will get also some laboratory work-up to further evaluate.  Patient will be seen pulmonology in the next couple days but will send them a note as well to see if any other test such as MRI or bone scan would be appropriate at this time.

## 2020-06-19 NOTE — Assessment & Plan Note (Signed)
Patient does have some physical deconditioning.  Has had this for quite some time though when reviewing patient's chart.

## 2020-06-20 ENCOUNTER — Other Ambulatory Visit: Payer: Self-pay

## 2020-06-20 DIAGNOSIS — I6523 Occlusion and stenosis of bilateral carotid arteries: Secondary | ICD-10-CM

## 2020-06-20 DIAGNOSIS — R911 Solitary pulmonary nodule: Secondary | ICD-10-CM

## 2020-06-20 LAB — LACTATE DEHYDROGENASE: LDH: 166 U/L (ref 120–250)

## 2020-06-21 ENCOUNTER — Other Ambulatory Visit: Payer: Self-pay

## 2020-06-21 ENCOUNTER — Ambulatory Visit: Payer: Medicare Other | Admitting: Emergency Medicine

## 2020-06-21 ENCOUNTER — Encounter: Payer: Self-pay | Admitting: Emergency Medicine

## 2020-06-21 VITALS — BP 116/68 | HR 109 | Temp 98.3°F | Ht 59.0 in | Wt 149.6 lb

## 2020-06-21 DIAGNOSIS — R911 Solitary pulmonary nodule: Secondary | ICD-10-CM | POA: Diagnosis not present

## 2020-06-21 DIAGNOSIS — F17200 Nicotine dependence, unspecified, uncomplicated: Secondary | ICD-10-CM | POA: Diagnosis not present

## 2020-06-21 DIAGNOSIS — J449 Chronic obstructive pulmonary disease, unspecified: Secondary | ICD-10-CM | POA: Insufficient documentation

## 2020-06-21 NOTE — Progress Notes (Signed)
Subjective:    Patient ID: [name], female    DOB: January 29, 1940, 80 y.o.   MRN: [id number]  HPI 80 year old smoker (47 pack years, now 0.5 pk/day) with history of CAD, hypertension, carotid artery disease with history of TIA, prior small bowel obstruction with colectomy/colostomy, remote DVT (not on anticoagulation), GERD with hiatal hernia.  Also.  History of COPD, not on any bronchodilator therapy. She underwent CT chest on 05/23/2020 and is here to review. No breathing limitations, able to shop and do housework. She is limited by back pain.   CT chest 05/23/20 reviewed by me, shows some scattered mediastinal lymph nodes that do not reach pathologic size criteria, a new spiculated right lower lobe pulmonary nodule with some central cavitation, 1.5 cm in largest diameter   Review of Systems As per HPI  Past Medical History:  Diagnosis Date  . Acute respiratory failure with hypoxia s/p tracheostomy 07/2013  . Anemia    a. 7-08/2013 felt due to recent critical illness/chronic disease.  . Arthritis    handds, knees & back   . Barrett's esophagus   . CAD (coronary artery disease)    a. Mod dz 2010 initially mgd medically. b. 12/2012 - angina s/p PTCA/DES to mid-circumflex, PTCA/DES to first OM.   Marland Kitchen Carotid artery disease ([place])    a. 60-70% bilat ICA stenosis by dopplers 05/2012.  Marland Kitchen Chronic back pain    deteriorating;DDD  . Colonic obstruction due to suspected colitis; s/p colectomy/colostomy    a. See SBO.  Marland Kitchen Complication of anesthesia    hard to wake up from anesthesia   . COPD (chronic obstructive pulmonary disease) ([place])   . Depression    takes Remeron and Zoloft daily  . Dizziness    was on Bentyl which caused this-took off of it and no problems since  . DVT of axillary vein, acute right ([place])    a. Dx 07/2013 felt due to R IJ central line that had been inserted 7/14. Not on anticoag due to GIB issues and small cerebral bleed.  Marland Kitchen GERD (gastroesophageal reflux disease)    . Heart murmur   . History of bronchitis    > 40yrs ago   . History of colon polyps   . History of hiatal hernia   . History of kidney stones    was told it was stable but doesn't know for sure that she ever passed it  . HTN (hypertension)   . Hypertension    takes Metoprolol daily  . Intracranial hemorrhage ([place])    a. 08/11/13 - per DC summary, tiny SAH vs SDH done for altered mentation, anticoag stopped including aspirin.  . Joint pain   . Neck pain    DDD  . Other and unspecified hyperlipidemia    takes Lipitor daily  . Pneumonia    hx of->15 yrs ago  . Protein-calorie malnutrition, severe w/ electrolyte imbalance    a. Severe hypoalbuminemia leading to 3rd spacing including anasarca and pulm edema 07/2013.  Marland Kitchen SBO (small bowel obstruction) ([place])    a. Lysis of adhesions and ovarian cystectomy 04/2013 for sBO. b. Admitted 07/2013 for colonic obstruction due to suspected colitis, s/p partial colectomy/colostomy 08/01/13. Admission complicated by anasarca, acute resp failure requiring tracheostomy, decannulated 08/25/13. c. Recurrent SBO8/2015, NGT placed for decompression.   . Stroke [facility]) early 80's   right sided weakness--TIA     Family History  Problem Relation Age of Onset  . Varicose Veins Mother   . Heart  disease Father   . Heart attack Father   . AAA (abdominal aortic aneurysm) Father   . Heart disease Other      Social History   Socioeconomic History  . Marital status: Married    Spouse name: Not on file  . Number of children: Not on file  . Years of education: Not on file  . Highest education level: Not on file  Occupational History  . Occupation: full time  Tobacco Use  . Smoking status: Current Some Day Smoker    Packs/day: 0.25    Years: 53.00    Pack years: 13.25    Types: Cigarettes  . Smokeless tobacco: Never Used  . Tobacco comment: 10 cigarettes smokes daily ARJ 06/21/20  Vaping Use  . Vaping Use: Never used  Substance and Sexual Activity  .  Alcohol use: No    Alcohol/week: 0.0 standard drinks  . Drug use: No  . Sexual activity: Not Currently    Birth control/protection: Surgical  Other Topics Concern  . Not on file  Social History Narrative  . Not on file   Social Determinants of Health   Financial Resource Strain: Not on file  Food Insecurity: Not on file  Transportation Needs: Not on file  Physical Activity: Not on file  Stress: Not on file  Social Connections: Not on file  Intimate Partner Violence: Not on file    Has worked in retail, as an Optometrist Has lived in [place] and [place], [place]  Former husband was in the [employer].    Allergies  Allergen Reactions  . Pregabalin Swelling    Tongue swelling  . Sulfonamide Derivatives Swelling    Childhood reaction     Outpatient Medications Prior to Visit  Medication Sig Dispense Refill  . aspirin EC 81 MG tablet Take 162 mg by mouth daily.    Marland Kitchen atorvastatin (LIPITOR) 80 MG tablet Take 80 mg by mouth every evening.     Marland Kitchen buPROPion (WELLBUTRIN XL) 150 MG 24 hr tablet Take 1 tablet (150 mg total) by mouth daily. 90 tablet 3  . docusate sodium (COLACE) 100 MG capsule Take 100 mg by mouth as needed for mild constipation.     . fish oil-omega-3 fatty acids 1000 MG capsule Take 1,000 mg by mouth daily.    . fluticasone (FLONASE) 50 MCG/ACT nasal spray Place 1 spray into both nostrils daily as needed for allergies.    . folic acid (FOLVITE) 914 MCG tablet Take 800 mcg by mouth daily.    . metoprolol succinate (TOPROL-XL) 50 MG 24 hr tablet TAKE 1 TABLET BY MOUTH  DAILY 90 tablet 3  . mirtazapine (REMERON) 45 MG tablet Take 45 mg by mouth at bedtime.     . nitroGLYCERIN (NITROSTAT) 0.4 MG SL tablet DISSOLVE 1 TABLET UNDER THE TONGUE EVERY 5 MINUTES AS  NEEDED FOR CHEST PAIN. MAX  OF 3 TABLETS IN 15 MINUTES. CALL 911 IF PAIN PERSISTS. 25 tablet 3  . ondansetron (ZOFRAN) 4 MG tablet Take 4 mg by mouth every 8 (eight) hours as needed for nausea or vomiting.     Marland Kitchen oxyCODONE  (OXY IR/ROXICODONE) 5 MG immediate release tablet Take 5 mg by mouth as needed.    . sertraline (ZOLOFT) 100 MG tablet Take 100 mg by mouth 2 (two) times daily.    . nicotine (NICODERM CQ - DOSED IN MG/24 HR) 7 mg/24hr patch Place 1 patch (7 mg total) onto the skin daily. (Patient not taking: Reported on 06/21/2020) 28  patch 3   No facility-administered medications prior to visit.        Objective:   Physical Exam  Today's Vitals   06/21/20 1608  BP: 116/68  Pulse: (!) 109  Temp: 98.3 F (36.8 C)  TempSrc: Temporal  SpO2: 96%  Weight: 149 lb 9.6 oz (67.9 kg)  Height: 4\' 11"  (1.499 m)   Body mass index is 30.22 kg/m.;sm Gen: Pleasant, overwt, in no distress,  normal affect  ENT: No lesions,  mouth clear,  oropharynx clear, no postnasal drip  Neck: No JVD, no stridor  Lungs: No use of accessory muscles, distant, no crackles or wheezing on normal respiration, no wheeze on forced expiration  Cardiovascular: RRR, heart sounds normal, no murmur or gallops, no peripheral edema  Musculoskeletal: No deformities, no cyanosis or clubbing  Neuro: alert, awake, non focal  Skin: Warm, no lesions or rash     Assessment & Plan:  Pulmonary nodule 1 cm or greater in diameter Reviewed the CT chest with her today.  There is a 1.5 cm right lower lobe pulmonary nodule that does have characteristics consistent with malignancy.  Reviewed the options with her.  I recommended that we pursue bronchoscopy and a tissue diagnosis.  She understands the risk, benefits and rationale.  She agrees to proceed.  I will try to get this arranged for 07/02/2020.  I will go ahead and order the PET scan as I suspect she will need staging.  TOBACCO ABUSE She has been able to cut down to half a pack a day.  She has a significant tobacco history.  We talked about cutting down, goal for cessation  COPD (chronic obstructive pulmonary disease) ([place]) Presumed COPD.  She does not have any respiratory limitation at this  time.  We will perform pulmonary function testing going forward.  Hold off on bronchodilators for now  Baltazar Apo, MD, PhD 06/21/2020, 5:04 PM [facility] [phone] or if no answer before 7:00PM call [phone] For any issues after 7:00PM please call eLink [phone]

## 2020-06-21 NOTE — Assessment & Plan Note (Signed)
Reviewed the CT chest with her today.  There is a 1.5 cm right lower lobe pulmonary nodule that does have characteristics consistent with malignancy.  Reviewed the options with her.  I recommended that we pursue bronchoscopy and a tissue diagnosis.  She understands the risk, benefits and rationale.  She agrees to proceed.  I will try to get this arranged for 07/02/2020.  I will go ahead and order the PET scan as I suspect she will need staging.

## 2020-06-21 NOTE — Assessment & Plan Note (Signed)
She has been able to cut down to half a pack a day.  She has a significant tobacco history.  We talked about cutting down, goal for cessation

## 2020-06-21 NOTE — H&P (View-Only) (Signed)
Subjective:    Patient ID: [name], female    DOB: 10/29/1940, 80 y.o.   MRN: [id number]  HPI 80 year old smoker (29 pack years, now 0.5 pk/day) with history of CAD, hypertension, carotid artery disease with history of TIA, prior small bowel obstruction with colectomy/colostomy, remote DVT (not on anticoagulation), GERD with hiatal hernia.  Also.  History of COPD, not on any bronchodilator therapy. She underwent CT chest on 05/23/2020 and is here to review. No breathing limitations, able to shop and do housework. She is limited by back pain.   CT chest 05/23/20 reviewed by me, shows some scattered mediastinal lymph nodes that do not reach pathologic size criteria, a new spiculated right lower lobe pulmonary nodule with some central cavitation, 1.5 cm in largest diameter   Review of Systems As per HPI  Past Medical History:  Diagnosis Date  . Acute respiratory failure with hypoxia s/p tracheostomy 07/2013  . Anemia    a. 7-08/2013 felt due to recent critical illness/chronic disease.  . Arthritis    handds, knees & back   . Barrett's esophagus   . CAD (coronary artery disease)    a. Mod dz 2010 initially mgd medically. b. 12/2012 - angina s/p PTCA/DES to mid-circumflex, PTCA/DES to first OM.   Marland Kitchen Carotid artery disease ([place])    a. 60-70% bilat ICA stenosis by dopplers 05/2012.  Marland Kitchen Chronic back pain    deteriorating;DDD  . Colonic obstruction due to suspected colitis; s/p colectomy/colostomy    a. See SBO.  Marland Kitchen Complication of anesthesia    hard to wake up from anesthesia   . COPD (chronic obstructive pulmonary disease) ([place])   . Depression    takes Remeron and Zoloft daily  . Dizziness    was on Bentyl which caused this-took off of it and no problems since  . DVT of axillary vein, acute right ([place])    a. Dx 07/2013 felt due to R IJ central line that had been inserted 7/14. Not on anticoag due to GIB issues and small cerebral bleed.  Marland Kitchen GERD (gastroesophageal reflux disease)    . Heart murmur   . History of bronchitis    > 69yrs ago   . History of colon polyps   . History of hiatal hernia   . History of kidney stones    was told it was stable but doesn't know for sure that she ever passed it  . HTN (hypertension)   . Hypertension    takes Metoprolol daily  . Intracranial hemorrhage ([place])    a. 08/11/13 - per DC summary, tiny SAH vs SDH done for altered mentation, anticoag stopped including aspirin.  . Joint pain   . Neck pain    DDD  . Other and unspecified hyperlipidemia    takes Lipitor daily  . Pneumonia    hx of->15 yrs ago  . Protein-calorie malnutrition, severe w/ electrolyte imbalance    a. Severe hypoalbuminemia leading to 3rd spacing including anasarca and pulm edema 07/2013.  Marland Kitchen SBO (small bowel obstruction) ([place])    a. Lysis of adhesions and ovarian cystectomy 04/2013 for sBO. b. Admitted 07/2013 for colonic obstruction due to suspected colitis, s/p partial colectomy/colostomy 08/01/13. Admission complicated by anasarca, acute resp failure requiring tracheostomy, decannulated 08/25/13. c. Recurrent SBO8/2015, NGT placed for decompression.   . Stroke [facility]) early 80's   right sided weakness--TIA     Family History  Problem Relation Age of Onset  . Varicose Veins Mother   . Heart  disease Father   . Heart attack Father   . AAA (abdominal aortic aneurysm) Father   . Heart disease Other      Social History   Socioeconomic History  . Marital status: Married    Spouse name: Not on file  . Number of children: Not on file  . Years of education: Not on file  . Highest education level: Not on file  Occupational History  . Occupation: full time  Tobacco Use  . Smoking status: Current Some Day Smoker    Packs/day: 0.25    Years: 53.00    Pack years: 13.25    Types: Cigarettes  . Smokeless tobacco: Never Used  . Tobacco comment: 10 cigarettes smokes daily ARJ 06/21/20  Vaping Use  . Vaping Use: Never used  Substance and Sexual Activity  .  Alcohol use: No    Alcohol/week: 0.0 standard drinks  . Drug use: No  . Sexual activity: Not Currently    Birth control/protection: Surgical  Other Topics Concern  . Not on file  Social History Narrative  . Not on file   Social Determinants of Health   Financial Resource Strain: Not on file  Food Insecurity: Not on file  Transportation Needs: Not on file  Physical Activity: Not on file  Stress: Not on file  Social Connections: Not on file  Intimate Partner Violence: Not on file    Has worked in retail, as an Optometrist Has lived in [place] and [place], [place]  Former husband was in the [employer].    Allergies  Allergen Reactions  . Pregabalin Swelling    Tongue swelling  . Sulfonamide Derivatives Swelling    Childhood reaction     Outpatient Medications Prior to Visit  Medication Sig Dispense Refill  . aspirin EC 81 MG tablet Take 162 mg by mouth daily.    Marland Kitchen atorvastatin (LIPITOR) 80 MG tablet Take 80 mg by mouth every evening.     Marland Kitchen buPROPion (WELLBUTRIN XL) 150 MG 24 hr tablet Take 1 tablet (150 mg total) by mouth daily. 90 tablet 3  . docusate sodium (COLACE) 100 MG capsule Take 100 mg by mouth as needed for mild constipation.     . fish oil-omega-3 fatty acids 1000 MG capsule Take 1,000 mg by mouth daily.    . fluticasone (FLONASE) 50 MCG/ACT nasal spray Place 1 spray into both nostrils daily as needed for allergies.    . folic acid (FOLVITE) 263 MCG tablet Take 800 mcg by mouth daily.    . metoprolol succinate (TOPROL-XL) 50 MG 24 hr tablet TAKE 1 TABLET BY MOUTH  DAILY 90 tablet 3  . mirtazapine (REMERON) 45 MG tablet Take 45 mg by mouth at bedtime.     . nitroGLYCERIN (NITROSTAT) 0.4 MG SL tablet DISSOLVE 1 TABLET UNDER THE TONGUE EVERY 5 MINUTES AS  NEEDED FOR CHEST PAIN. MAX  OF 3 TABLETS IN 15 MINUTES. CALL 911 IF PAIN PERSISTS. 25 tablet 3  . ondansetron (ZOFRAN) 4 MG tablet Take 4 mg by mouth every 8 (eight) hours as needed for nausea or vomiting.     Marland Kitchen oxyCODONE  (OXY IR/ROXICODONE) 5 MG immediate release tablet Take 5 mg by mouth as needed.    . sertraline (ZOLOFT) 100 MG tablet Take 100 mg by mouth 2 (two) times daily.    . nicotine (NICODERM CQ - DOSED IN MG/24 HR) 7 mg/24hr patch Place 1 patch (7 mg total) onto the skin daily. (Patient not taking: Reported on 06/21/2020) 28  patch 3   No facility-administered medications prior to visit.        Objective:   Physical Exam  Today's Vitals   06/21/20 1608  BP: 116/68  Pulse: (!) 109  Temp: 98.3 F (36.8 C)  TempSrc: Temporal  SpO2: 96%  Weight: 149 lb 9.6 oz (67.9 kg)  Height: 4\' 11"  (1.499 m)   Body mass index is 30.22 kg/m.;sm Gen: Pleasant, overwt, in no distress,  normal affect  ENT: No lesions,  mouth clear,  oropharynx clear, no postnasal drip  Neck: No JVD, no stridor  Lungs: No use of accessory muscles, distant, no crackles or wheezing on normal respiration, no wheeze on forced expiration  Cardiovascular: RRR, heart sounds normal, no murmur or gallops, no peripheral edema  Musculoskeletal: No deformities, no cyanosis or clubbing  Neuro: alert, awake, non focal  Skin: Warm, no lesions or rash     Assessment & Plan:  Pulmonary nodule 1 cm or greater in diameter Reviewed the CT chest with her today.  There is a 1.5 cm right lower lobe pulmonary nodule that does have characteristics consistent with malignancy.  Reviewed the options with her.  I recommended that we pursue bronchoscopy and a tissue diagnosis.  She understands the risk, benefits and rationale.  She agrees to proceed.  I will try to get this arranged for 07/02/2020.  I will go ahead and order the PET scan as I suspect she will need staging.  TOBACCO ABUSE She has been able to cut down to half a pack a day.  She has a significant tobacco history.  We talked about cutting down, goal for cessation  COPD (chronic obstructive pulmonary disease) ([place]) Presumed COPD.  She does not have any respiratory limitation at this  time.  We will perform pulmonary function testing going forward.  Hold off on bronchodilators for now  Baltazar Apo, MD, PhD 06/21/2020, 5:04 PM [facility] [phone] or if no answer before 7:00PM call [phone] For any issues after 7:00PM please call eLink [phone]

## 2020-06-21 NOTE — Patient Instructions (Signed)
We will arrange for bronchoscopy to evaluate your small right lower lobe pulmonary nodule.  This will be done under general anesthesia at [facility] as an outpatient.  You will need a designated driver.  You will be contacted with more details about when to arrive in preparation.  We will ask you to stop your aspirin 2 days prior to the procedure date. We will arrange for PET scan to further evaluate your nodule Follow with Dr Lamonte Sakai in 1 month

## 2020-06-21 NOTE — Assessment & Plan Note (Signed)
Presumed COPD.  She does not have any respiratory limitation at this time.  We will perform pulmonary function testing going forward.  Hold off on bronchodilators for now

## 2020-06-22 ENCOUNTER — Telehealth: Payer: Self-pay | Admitting: Emergency Medicine

## 2020-06-22 DIAGNOSIS — R911 Solitary pulmonary nodule: Secondary | ICD-10-CM

## 2020-06-22 NOTE — Telephone Encounter (Signed)
Dr. Lamonte Sakai, Per the [facility]:  I tried to get Ct from 05/23/20 converted and they do not have the scan on the computer anymore so she will need a Super D ordered.  Do you want [place] to place an order for a Super D?  Please advise.  Thank you.

## 2020-06-25 NOTE — Telephone Encounter (Signed)
Yes please

## 2020-06-25 NOTE — Telephone Encounter (Signed)
Order placed for super D as patient is having a bronch on 6/13, needs it first available.  Nothing further needed.

## 2020-06-27 ENCOUNTER — Other Ambulatory Visit: Payer: Self-pay

## 2020-06-27 ENCOUNTER — Ambulatory Visit (INDEPENDENT_AMBULATORY_CARE_PROVIDER_SITE_OTHER)
Admission: RE | Admit: 2020-06-27 | Discharge: 2020-06-27 | Disposition: A | Discharge status: Home / Self Care | Payer: Medicare Other | Source: Ambulatory Visit | Attending: Emergency Medicine | Admitting: Emergency Medicine

## 2020-06-27 DIAGNOSIS — J8410 Pulmonary fibrosis, unspecified: Secondary | ICD-10-CM | POA: Diagnosis not present

## 2020-06-27 DIAGNOSIS — R911 Solitary pulmonary nodule: Secondary | ICD-10-CM

## 2020-06-27 DIAGNOSIS — I2510 Atherosclerotic heart disease of native coronary artery without angina pectoris: Secondary | ICD-10-CM | POA: Diagnosis not present

## 2020-06-27 DIAGNOSIS — K449 Diaphragmatic hernia without obstruction or gangrene: Secondary | ICD-10-CM | POA: Diagnosis not present

## 2020-06-27 DIAGNOSIS — J432 Centrilobular emphysema: Secondary | ICD-10-CM | POA: Diagnosis not present

## 2020-06-28 ENCOUNTER — Telehealth: Payer: Self-pay | Admitting: Emergency Medicine

## 2020-06-29 ENCOUNTER — Telehealth: Payer: Self-pay | Admitting: Emergency Medicine

## 2020-06-29 ENCOUNTER — Encounter (HOSPITAL_COMMUNITY): Payer: Self-pay | Admitting: Emergency Medicine

## 2020-06-29 NOTE — Progress Notes (Signed)
Anesthesia Chart Review: Same day workup  Follows with cardiology for history of CAD s/p PCI to [place] 2014, HTN, HLD, HFpEF, and PAD. She was hospitalized in December 2020 with retrosternal chest pressure, and was diagnosed with non-STEMI. High-sensitivity troponin was up to 23,000. She underwent cardiac catheterization that showed distal total occlusion of the obtuse marginal branch of the left circumflex coronary artery.  There was faint collateralization from the LAD.  Her LVEF was 55 to 60%. No PCI performed and patient was continued on medical management. Last seen by Dr. Johney Frame 05/11/2020.  Per note, "...she is doing well from a cardiac standpoint. Denies any chest discomfort, SOB, LE edema, dizziness, or focal weakness. She is tolerating her current medications. Her blood pressure at home is well-controlled at this time. Her last catheterization was in 2020 where she presented with chest pain. Since then, she has had no similar symptoms."  She was recommended continue current medical management follow-up in 6 months.  History of carotid stenosis s/p right CEA 3/61/4431 was complicated by postop stroke in the recovery room.  This resulted in left hemiplegia.  She is followed by Dr. Oneida Alar.  Most recent carotid ultrasound 09/28/2018 showed stable 40 to 59% stenosis bilaterally.  Per pulmonology notes, patient has 65-pack-year smoking history, now smoking half pack per day.  Recently underwent screening CT chest on 05/23/2020 that showed new spiculated right lower lobe nodule.  She was referred to Dr. Lamonte Sakai and seen 06/21/2020, he recommended bronchoscopy for tissue diagnosis.  History of respiratory failure requiring tracheostomy in 2015 during admission for small bowel obstruction requiring partial colectomy/colostomy.  Etiology was unclear but differential included arts, HCAP.  She was decannulated during admission without incident.  CMP and CBC from 06/19/2020 reviewed, unremarkable.  EKG 05/11/2020:  NSR.  Rate 87.  Isolated PAC.  Cath 01/10/19: Previously placed Mid Cx to Dist Cx stent (unknown type) is widely patent. Mid Cx lesion is 30% stenosed. 2nd Mrg-1 lesion is 40% stenosed. Previously placed 2nd Mrg-2 stent (unknown type) is widely patent. 2nd Mrg-3 lesion is 100% stenosed. Prox RCA lesion is 30% stenosed. The left ventricular systolic function is normal. LV end diastolic pressure is mildly elevated.   Non-ST segment elevation myocardial infarction secondary to distal total occlusion of the obtuse marginal branch of the left circumflex coronary artery,   Normal left main, LAD and mild nonobstructive 30% narrowing in the RCA which supplies the posterior wall.   The left circumflex vessel has 40% ostial stenosis in the OM 2 vessel with a widely patent stent in the midportion of this marginal branch.  The distal marginal is 100% occluded.  There is faint collateralization from the LAD to the distal vessel which is small caliber; the AV groove circumflex has 30% narrowing in the stent in the AV groove circumflex is widely patent.   Serve global LV function with EF estimated 55%.  There is a subtle focal area of distal inferior hypocontractility.   RECOMMENDATION: The patient's ECG is entirely normal.  She is not having any recurrent chest pain.  Her MI was over 48 hours ago.  With her stability and distal occlusion recommend medical management.  Consider DAPT; however, will need to obtain additional information with her history of prior stroke following carotid endarterectomy remotely.  Alternatively aspirin alone and medical therapy for her current obstructive disease.  Smoking cessation is essential.  Aggressive lipid-lowering therapy with target LDL less than 70.   TTE 01/09/19: IMPRESSIONS   1. Left ventricular ejection  fraction, by visual estimation, is 60 to  65%. The left ventricle has normal function. There is borderline left  ventricular hypertrophy.   2. Left  ventricular diastolic parameters are consistent with Grade II  diastolic dysfunction (pseudonormalization).   3. The left ventricle has no regional wall motion abnormalities.   4. Global right ventricle was not well visualized.The right ventricular  size is normal. No increase in right ventricular wall thickness.   5. Left atrial size was mild-moderately dilated.   6. Right atrial size was normal.   7. Presence of pericardial fat pad.   8. Small pericardial effusion.   9. The mitral valve is rheumatic. Trivial mitral valve regurgitation.  Mild mitral stenosis.  10. The tricuspid valve is normal in structure. Tricuspid valve  regurgitation is trivial.  11. The aortic valve is tricuspid. Aortic valve regurgitation is not  visualized. No evidence of aortic valve sclerosis or stenosis.  12. Pulmonic regurgitation is mild.  13. The pulmonic valve was grossly normal. Pulmonic valve regurgitation is  mild.   Zio [id number] 10/2018: Sinus rhythm to sinus tachycardia. Frequent nsVTs with maximum rate 177 BPM, the longest lasting 12 seconds. Frequent short runs of SVTs.   Sinus rhythm to sinus tachycardia. Frequent nsVTs with maximum rate 177 BPM, the longest lasting 12 seconds.   Patients symptoms didn't correlate with significant arrhythmias. Echocardiogram and referral to EP is recommended.   Wynonia Musty [facility] Short Stay Center/Anesthesiology Phone [phone] 06/29/2020 10:19 AM

## 2020-06-29 NOTE — Progress Notes (Signed)
DUE TO COVID-19 ONLY ONE VISITOR IS ALLOWED TO COME WITH YOU AND STAY IN THE WAITING ROOM ONLY DURING PRE OP AND PROCEDURE DAY OF SURGERY.   PCP - Laverna Peace, [facility],  Cardiologist - Dr Gwyndolyn Kaufman  CT Chest x-ray - 06/27/20 EKG - 05/11/20 Stress Test - 11/07/13 ECHO - 01/09/19 Cardiac Cath - 01/10/19  Anesthesia review: Yes  STOP now taking any Aspirin (unless otherwise instructed by your surgeon), Aleve, Naproxen, Ibuprofen, Motrin, Advil, Goody's, BC's, all herbal medications, fish oil, and all vitamins.   Coronavirus Screening Per Heather at MD's office, patient will need a covid test for this procedure per MD.   Do you have any of the following symptoms:  Cough yes/no: No Fever (>100.51F)  yes/no: No Runny nose yes/no: No Sore throat yes/no: No Difficulty breathing/shortness of breath  yes/no: No  Have you traveled in the last 14 days and where? yes/no: No  Patient verbalized understanding of instructions that were given via phone.

## 2020-06-29 NOTE — Telephone Encounter (Signed)
Called and spoke with Arbie Cookey, advised her that Dr. Lamonte Sakai does want the patient tested prior to her procedure.  I asked if the criteria had changed for testing patients prior to procedures had changed, she stated as of 06/01/20 patients are tested for covid prior to procedures if the MD orders it, if they will be admitted overnight, or if they are symptomatic by their screening questions.  Nothing further needed.

## 2020-06-29 NOTE — Telephone Encounter (Signed)
Thank you :)

## 2020-06-29 NOTE — Anesthesia Preprocedure Evaluation (Addendum)
Anesthesia Evaluation  Patient identified by MRN, date of birth, ID band Patient awake    Reviewed: Allergy & Precautions, NPO status , Patient's Chart, lab work & pertinent test results  Airway Mallampati: I  TM Distance: >3 FB Neck ROM: Full    Dental no notable dental hx. (+) Edentulous Upper, Edentulous Lower   Pulmonary COPD, Current Smoker (65 pack year hx)Patient did not abstain from smoking.,  S/p trach   Pulmonary exam normal breath sounds clear to auscultation       Cardiovascular hypertension, + CAD, + Past MI (2014) and + Cardiac Stents  Normal cardiovascular exam Rhythm:Regular Rate:Normal  EF 55-60%   Neuro/Psych PSYCHIATRIC DISORDERS Depression CVA (L hemipledgia), Residual Symptoms    GI/Hepatic Neg liver ROS, GERD  Medicated and Controlled,  Endo/Other    Renal/GU negative Renal ROSK+ 4.3 Cr 0.98     Musculoskeletal  (+) Arthritis ,   Abdominal (+) + obese,   Peds  Hematology hgb 11.3 plt 229   Anesthesia Other Findings   Reproductive/Obstetrics                           Anesthesia Physical Anesthesia Plan  ASA: 3  Anesthesia Plan: General   Post-op Pain Management:    Induction: Intravenous  PONV Risk Score and Plan: Ondansetron and Treatment may vary due to age or medical condition  Airway Management Planned: Oral ETT  Additional Equipment:   Intra-op Plan:   Post-operative Plan: Extubation in OR  Informed Consent: I have reviewed the patients History and Physical, chart, labs and discussed the procedure including the risks, benefits and alternatives for the proposed anesthesia with the patient or authorized representative who has indicated his/her understanding and acceptance.     Dental advisory given  Plan Discussed with:   Anesthesia Plan Comments: (PAT note by Karoline Caldwell, PA-C: Follows with cardiology for history of CAD s/p PCI to [place] 2014, HTN,  HLD, HFpEF, and PAD. She was hospitalized in [place] with retrosternal chest pressure, and was diagnosed with non-STEMI. High-sensitivity troponin was up to 23,000. She underwent cardiac catheterization that showed distal total occlusion of the obtuse marginal branch of the left circumflex coronary artery. There was faint collateralization from the LAD. Her LVEF was 55 to 60%. No PCI performed and patient was continued on medical management. Last seen by Dr. Johney Frame 05/11/2020.  Per note, "...she is doing well from a cardiac standpoint.Denies any chest discomfort, SOB, LE edema, dizziness, or focal weakness. She is tolerating her current medications. Her blood pressure at home is well-controlled at this time. Her last catheterization was in 2020where she presented with chest pain.Since then, she has had no similar symptoms."  She was recommended continue current medical management follow-up in 6 months.  History of carotid stenosis s/p right CEA 2/69/4854 was complicated by postop stroke in the recovery room.  This resulted in left hemiplegia.  She is followed by Dr. Oneida Alar.  Most recent carotid ultrasound 09/28/2018 showed stable 40 to 59% stenosis bilaterally.  Per pulmonology notes, patient has 65-pack-year smoking history, now smoking half pack per day.  Recently underwent screening CT chest on 05/23/2020 that showed new spiculated right lower lobe nodule.  She was referred to Dr. Lamonte Sakai and seen 06/21/2020, he recommended bronchoscopy for tissue diagnosis.  History of respiratory failure requiring tracheostomy in 2015 during admission for small bowel obstruction requiring partial colectomy/colostomy.  Etiology was unclear but differential included arts, HCAP.  She was  decannulated during admission without incident.  CMP and CBC from 06/19/2020 reviewed, unremarkable.  EKG 05/11/2020: NSR.  Rate 87.  Isolated PAC.  Cath 01/10/19: . Previously placed Mid Cx to Dist Cx stent (unknown type) is  widely patent. . Mid Cx lesion is 30% stenosed. . 2nd Mrg-1 lesion is 40% stenosed. . Previously placed 2nd Mrg-2 stent (unknown type) is widely patent. . 2nd Mrg-3 lesion is 100% stenosed. . Prox RCA lesion is 30% stenosed. . The left ventricular systolic function is normal. . LV end diastolic pressure is mildly elevated.  Non-ST segment elevation myocardial infarction secondary to distal total occlusion of the obtuse marginal branch of the left circumflex coronary artery,  Normal left main, LAD and mild nonobstructive 30% narrowing in the RCA which supplies the posterior wall.  The left circumflex vessel has 40% ostial stenosis in the OM 2 vessel with a widely patent stent in the midportion of this marginal branch. The distal marginal is 100% occluded. There is faint collateralization from the LAD to the distal vessel which is small caliber; the AV groove circumflex has 30% narrowing in the stent in the AV groove circumflex is widely patent.  Serve global LV function with EF estimated 55%. There is a subtle focal area of distal inferior hypocontractility.  RECOMMENDATION: The patient's ECG is entirely normal. She is not having any recurrent chest pain. Her MI was over 48 hours ago. With her stability and distal occlusion recommend medical management. Consider DAPT; however, will need to obtain additional information with her history of prior stroke following carotid endarterectomy remotely. Alternatively aspirin alone and medical therapy for her current obstructive disease. Smoking cessation is essential. Aggressive lipid-lowering therapy with target LDL less than 70.  TTE 01/09/19: IMPRESSIONS  1. Left ventricular ejection fraction, by visual estimation, is 60 to  65%. The left ventricle has normal function. There is borderline left  ventricular hypertrophy.  2. Left ventricular diastolic parameters are consistent with Grade II  diastolic dysfunction  (pseudonormalization).  3. The left ventricle has no regional wall motion abnormalities.  4. Global right ventricle was not well visualized.The right ventricular  size is normal. No increase in right ventricular wall thickness.  5. Left atrial size was mild-moderately dilated.  6. Right atrial size was normal.  7. Presence of pericardial fat pad.  8. Small pericardial effusion.  9. The mitral valve is rheumatic. Trivial mitral valve regurgitation.  Mild mitral stenosis.  10. The tricuspid valve is normal in structure. Tricuspid valve  regurgitation is trivial.  11. The aortic valve is tricuspid. Aortic valve regurgitation is not  visualized. No evidence of aortic valve sclerosis or stenosis.  12. Pulmonic regurgitation is mild.  13. The pulmonic valve was grossly normal. Pulmonic valve regurgitation is  mild.  Zio [id number] 10/2018: . Sinus rhythm to sinus tachycardia. . Frequent nsVTs with maximum rate 177 BPM, the longest lasting 12 seconds. . Frequent short runs of SVTs.  Sinus rhythm to sinus tachycardia. Frequent nsVTs with maximum rate 177 BPM, the longest lasting 12 seconds.  Patients symptoms didn't correlate with significant arrhythmias. Echocardiogram and referral to EP is recommended. )      Anesthesia Quick Evaluation

## 2020-06-29 NOTE — Telephone Encounter (Signed)
Yes please

## 2020-06-29 NOTE — Telephone Encounter (Signed)
ATC Carol x 1, had to leave vm to return our call.  Will route to Dr. Lamonte Sakai to verify.    Dr. Lamonte Sakai, Please advise if you want patient covid tested prior to procedure.  Thank you.

## 2020-07-02 ENCOUNTER — Ambulatory Visit (HOSPITAL_COMMUNITY): Payer: Medicare Other

## 2020-07-02 ENCOUNTER — Encounter (HOSPITAL_COMMUNITY): Payer: Self-pay | Admitting: Emergency Medicine

## 2020-07-02 ENCOUNTER — Ambulatory Visit (HOSPITAL_COMMUNITY): Payer: Medicare Other | Admitting: Physician Assistant

## 2020-07-02 ENCOUNTER — Encounter (HOSPITAL_COMMUNITY)
Admission: RE | Disposition: A | Discharge status: Home / Self Care | Payer: Self-pay | Source: Home / Self Care | Attending: Emergency Medicine

## 2020-07-02 ENCOUNTER — Ambulatory Visit (HOSPITAL_COMMUNITY)
Admission: RE | Admit: 2020-07-02 | Discharge: 2020-07-02 | Disposition: A | Discharge status: Home / Self Care | Payer: Medicare Other | Attending: Emergency Medicine | Admitting: Emergency Medicine

## 2020-07-02 DIAGNOSIS — E782 Mixed hyperlipidemia: Secondary | ICD-10-CM | POA: Diagnosis not present

## 2020-07-02 DIAGNOSIS — K219 Gastro-esophageal reflux disease without esophagitis: Secondary | ICD-10-CM | POA: Insufficient documentation

## 2020-07-02 DIAGNOSIS — Z20822 Contact with and (suspected) exposure to covid-19: Secondary | ICD-10-CM | POA: Insufficient documentation

## 2020-07-02 DIAGNOSIS — Z79899 Other long term (current) drug therapy: Secondary | ICD-10-CM | POA: Insufficient documentation

## 2020-07-02 DIAGNOSIS — E876 Hypokalemia: Secondary | ICD-10-CM | POA: Diagnosis not present

## 2020-07-02 DIAGNOSIS — Z7982 Long term (current) use of aspirin: Secondary | ICD-10-CM | POA: Insufficient documentation

## 2020-07-02 DIAGNOSIS — I779 Disorder of arteries and arterioles, unspecified: Secondary | ICD-10-CM | POA: Insufficient documentation

## 2020-07-02 DIAGNOSIS — C3431 Malignant neoplasm of lower lobe, right bronchus or lung: Secondary | ICD-10-CM | POA: Diagnosis not present

## 2020-07-02 DIAGNOSIS — K449 Diaphragmatic hernia without obstruction or gangrene: Secondary | ICD-10-CM | POA: Diagnosis not present

## 2020-07-02 DIAGNOSIS — Z955 Presence of coronary angioplasty implant and graft: Secondary | ICD-10-CM | POA: Diagnosis not present

## 2020-07-02 DIAGNOSIS — Z86718 Personal history of other venous thrombosis and embolism: Secondary | ICD-10-CM | POA: Diagnosis not present

## 2020-07-02 DIAGNOSIS — R911 Solitary pulmonary nodule: Secondary | ICD-10-CM | POA: Diagnosis not present

## 2020-07-02 DIAGNOSIS — Z882 Allergy status to sulfonamides status: Secondary | ICD-10-CM | POA: Insufficient documentation

## 2020-07-02 DIAGNOSIS — F17210 Nicotine dependence, cigarettes, uncomplicated: Secondary | ICD-10-CM | POA: Diagnosis not present

## 2020-07-02 DIAGNOSIS — I69354 Hemiplegia and hemiparesis following cerebral infarction affecting left non-dominant side: Secondary | ICD-10-CM | POA: Insufficient documentation

## 2020-07-02 DIAGNOSIS — I2510 Atherosclerotic heart disease of native coronary artery without angina pectoris: Secondary | ICD-10-CM | POA: Insufficient documentation

## 2020-07-02 DIAGNOSIS — Z888 Allergy status to other drugs, medicaments and biological substances status: Secondary | ICD-10-CM | POA: Diagnosis not present

## 2020-07-02 DIAGNOSIS — I5032 Chronic diastolic (congestive) heart failure: Secondary | ICD-10-CM | POA: Diagnosis not present

## 2020-07-02 DIAGNOSIS — I509 Heart failure, unspecified: Secondary | ICD-10-CM

## 2020-07-02 DIAGNOSIS — I700 Atherosclerosis of aorta: Secondary | ICD-10-CM | POA: Diagnosis not present

## 2020-07-02 DIAGNOSIS — I252 Old myocardial infarction: Secondary | ICD-10-CM | POA: Insufficient documentation

## 2020-07-02 DIAGNOSIS — I10 Essential (primary) hypertension: Secondary | ICD-10-CM | POA: Diagnosis not present

## 2020-07-02 DIAGNOSIS — I110 Hypertensive heart disease with heart failure: Secondary | ICD-10-CM | POA: Diagnosis not present

## 2020-07-02 DIAGNOSIS — J984 Other disorders of lung: Secondary | ICD-10-CM | POA: Diagnosis not present

## 2020-07-02 DIAGNOSIS — I517 Cardiomegaly: Secondary | ICD-10-CM | POA: Diagnosis not present

## 2020-07-02 DIAGNOSIS — R918 Other nonspecific abnormal finding of lung field: Secondary | ICD-10-CM | POA: Diagnosis not present

## 2020-07-02 DIAGNOSIS — Z419 Encounter for procedure for purposes other than remedying health state, unspecified: Secondary | ICD-10-CM

## 2020-07-02 HISTORY — PX: BRONCHIAL WASHINGS: SHX5105

## 2020-07-02 HISTORY — PX: BRONCHIAL BRUSHINGS: SHX5108

## 2020-07-02 HISTORY — PX: HEMOSTASIS CONTROL: SHX6838

## 2020-07-02 HISTORY — PX: VIDEO BRONCHOSCOPY WITH ENDOBRONCHIAL NAVIGATION: SHX6175

## 2020-07-02 HISTORY — PX: BRONCHIAL BIOPSY: SHX5109

## 2020-07-02 HISTORY — PX: BRONCHIAL NEEDLE ASPIRATION BIOPSY: SHX5106

## 2020-07-02 HISTORY — PX: FIDUCIAL MARKER PLACEMENT: SHX6858

## 2020-07-02 LAB — SARS CORONAVIRUS 2 BY RT PCR (HOSPITAL ORDER, PERFORMED IN ~~LOC~~ HOSPITAL LAB): SARS Coronavirus 2: NEGATIVE

## 2020-07-02 SURGERY — VIDEO BRONCHOSCOPY WITH ENDOBRONCHIAL NAVIGATION
Anesthesia: General

## 2020-07-02 MED ORDER — ONDANSETRON HCL 4 MG/2ML IJ SOLN
4.0000 mg | Freq: Once | INTRAMUSCULAR | Status: AC
  Administered 2020-07-02: 4 mg via INTRAVENOUS

## 2020-07-02 MED ORDER — ONDANSETRON HCL 4 MG/2ML IJ SOLN
INTRAMUSCULAR | Status: AC
  Filled 2020-07-02: qty 2

## 2020-07-02 MED ORDER — LACTATED RINGERS IV SOLN: INTRAVENOUS | Status: DC

## 2020-07-02 MED ORDER — MIDAZOLAM HCL 5 MG/5ML IJ SOLN
INTRAMUSCULAR | Status: DC | PRN
  Administered 2020-07-02: 1 mg via INTRAVENOUS

## 2020-07-02 MED ORDER — PROPOFOL 10 MG/ML IV BOLUS
INTRAVENOUS | Status: DC | PRN
  Administered 2020-07-02: 110 mg via INTRAVENOUS

## 2020-07-02 MED ORDER — FENTANYL CITRATE (PF) 100 MCG/2ML IJ SOLN
INTRAMUSCULAR | Status: DC | PRN
  Administered 2020-07-02: 100 ug via INTRAVENOUS

## 2020-07-02 MED ORDER — ONDANSETRON HCL 4 MG/2ML IJ SOLN
INTRAMUSCULAR | Status: DC | PRN
  Administered 2020-07-02: 4 mg via INTRAVENOUS

## 2020-07-02 MED ORDER — EPINEPHRINE 1 MG/10ML IJ SOSY
PREFILLED_SYRINGE | INTRAMUSCULAR | Status: AC
  Filled 2020-07-02: qty 10

## 2020-07-02 MED ORDER — ASPIRIN EC 81 MG PO TBEC: 162.0000 mg | DELAYED_RELEASE_TABLET | Freq: Every morning | ORAL | 11 refills | Status: DC

## 2020-07-02 MED ORDER — ACETAMINOPHEN 10 MG/ML IV SOLN
INTRAVENOUS | Status: AC
  Filled 2020-07-02: qty 100

## 2020-07-02 MED ORDER — SODIUM CHLORIDE (PF) 0.9 % IJ SOLN
PREFILLED_SYRINGE | INTRAMUSCULAR | Status: DC | PRN
  Administered 2020-07-02: 4 mL
  Administered 2020-07-02: 6 mL
  Administered 2020-07-02: 4 mL

## 2020-07-02 MED ORDER — CHLORHEXIDINE GLUCONATE 0.12 % MT SOLN
OROMUCOSAL | Status: AC
  Administered 2020-07-02: 15 mL
  Filled 2020-07-02: qty 15

## 2020-07-02 MED ORDER — SUGAMMADEX SODIUM 200 MG/2ML IV SOLN
INTRAVENOUS | Status: DC | PRN
  Administered 2020-07-02: 200 mg via INTRAVENOUS

## 2020-07-02 MED ORDER — LIDOCAINE 2% (20 MG/ML) 5 ML SYRINGE
INTRAMUSCULAR | Status: DC | PRN
  Administered 2020-07-02: 100 mg via INTRAVENOUS

## 2020-07-02 MED ORDER — ACETAMINOPHEN 10 MG/ML IV SOLN
1000.0000 mg | Freq: Four times a day (QID) | INTRAVENOUS | Status: DC
  Administered 2020-07-02: 1000 mg via INTRAVENOUS
  Filled 2020-07-02 (×4): qty 100

## 2020-07-02 MED ORDER — EPHEDRINE SULFATE-NACL 50-0.9 MG/10ML-% IV SOSY
PREFILLED_SYRINGE | INTRAVENOUS | Status: DC | PRN
  Administered 2020-07-02: 15 mg via INTRAVENOUS
  Administered 2020-07-02: 10 mg via INTRAVENOUS

## 2020-07-02 MED ORDER — ROCURONIUM BROMIDE 10 MG/ML (PF) SYRINGE
PREFILLED_SYRINGE | INTRAVENOUS | Status: DC | PRN
  Administered 2020-07-02: 50 mg via INTRAVENOUS
  Administered 2020-07-02: 10 mg via INTRAVENOUS

## 2020-07-02 SURGICAL SUPPLY — 1 items: superlock fiducial marker ×6 IMPLANT

## 2020-07-02 NOTE — Anesthesia Postprocedure Evaluation (Signed)
Anesthesia Post Note  Patient: [name]  Procedure(s) Performed: VIDEO BRONCHOSCOPY WITH ENDOBRONCHIAL NAVIGATION BRONCHIAL NEEDLE ASPIRATION BIOPSIES HEMOSTASIS CONTROL BRONCHIAL BRUSHINGS BRONCHIAL BIOPSIES BRONCHIAL WASHINGS FIDUCIAL MARKER PLACEMENT     Patient location during evaluation: Endoscopy Anesthesia Type: General Level of consciousness: awake and alert Pain management: pain level controlled Vital Signs Assessment: post-procedure vital signs reviewed and stable Respiratory status: spontaneous breathing, nonlabored ventilation, respiratory function stable and patient connected to nasal cannula oxygen Cardiovascular status: blood pressure returned to baseline and stable Postop Assessment: no apparent nausea or vomiting Anesthetic complications: no   No notable events documented.  Last Vitals:  Vitals:   07/02/20 1158 07/02/20 1213  BP: (!) 112/93 111/62  Pulse: 93 91  Resp: (!) 24 (!) 21  Temp:  36.6 C  SpO2: 95% 95%    Last Pain:  Vitals:   07/02/20 1143  TempSrc:   PainSc: 9                  Barnet Glasgow

## 2020-07-02 NOTE — Transfer of Care (Signed)
Immediate Anesthesia Transfer of Care Note  Patient: [name]  Procedure(s) Performed: VIDEO BRONCHOSCOPY WITH ENDOBRONCHIAL NAVIGATION BRONCHIAL NEEDLE ASPIRATION BIOPSIES HEMOSTASIS CONTROL BRONCHIAL BRUSHINGS BRONCHIAL BIOPSIES BRONCHIAL WASHINGS FIDUCIAL MARKER PLACEMENT  Patient Location: PACU  Anesthesia Type:General  Level of Consciousness: drowsy and patient cooperative  Airway & Oxygen Therapy: Patient Spontanous Breathing and Patient connected to nasal cannula oxygen  Post-op Assessment: Report given to RN, Post -op Vital signs reviewed and stable and Patient moving all extremities  Post vital signs: Reviewed and stable  Last Vitals:  Vitals Value Taken Time  BP 159/83 07/02/20 1113  Temp    Pulse 103 07/02/20 1114  Resp 24 07/02/20 1114  SpO2 94 % 07/02/20 1114  Vitals shown include unvalidated device data.  Last Pain:  Vitals:   07/02/20 0751  TempSrc:   PainSc: 7       Patients Stated Pain Goal: 2 (91/69/45 0388)  Complications: No notable events documented.

## 2020-07-02 NOTE — Discharge Instructions (Signed)
Flexible Bronchoscopy, Care After This sheet gives you information about how to care for yourself after your test. Your doctor may also give you more specific instructions. If you have problems or questions, contact your doctor. Follow these instructions at home: Eating and drinking Do not eat or drink anything (not even water) for 2 hours after your test, or until your numbing medicine (local anesthetic) wears off. When your numbness is gone and your cough and gag reflexes have come back, you may: Eat only soft foods. Slowly drink liquids. The day after the test, go back to your normal diet. Driving Do not drive for 24 hours if you were given a medicine to help you relax (sedative). Do not drive or use heavy machinery while taking prescription pain medicine. General instructions  Take over-the-counter and prescription medicines only as told by your doctor. Return to your normal activities as told. Ask what activities are safe for you. Do not use any products that have nicotine or tobacco in them. This includes cigarettes and e-cigarettes. If you need help quitting, ask your doctor. Keep all follow-up visits as told by your doctor. This is important. It is very important if you had a tissue sample (biopsy) taken. Get help right away if: You have shortness of breath that gets worse. You get light-headed. You feel like you are going to pass out (faint). You have chest pain. You cough up: More than a little blood. More blood than before. Summary Do not eat or drink anything (not even water) for 2 hours after your test, or until your numbing medicine wears off. Do not use cigarettes. Do not use e-cigarettes. Get help right away if you have chest pain.  Please call our office for any questions or concerns.  [phone].  This information is not intended to replace advice given to you by your health care provider. Make sure you discuss any questions you have with your health care  provider. Document Released: 11/03/2008 Document Revised: 12/19/2016 Document Reviewed: 01/25/2016 Elsevier Patient Education  2020 [employer].

## 2020-07-02 NOTE — Op Note (Signed)
Video Bronchoscopy with Electromagnetic Navigation Procedure Note  Date of Operation: 07/02/2020  Pre-op Diagnosis: Left mainstem bronchus endobronchial lesion, right lower lobe pulmonary nodule  Post-op Diagnosis: Same  Surgeon: Baltazar Apo  Assistants: None  Anesthesia: General endotracheal anesthesia  Operation: Flexible video fiberoptic bronchoscopy with electromagnetic navigation and biopsies.  Estimated Blood Loss: 25 cc  Complications: None apparent  Indications and History: [name] is a 80 y.o. female with history of tobacco use.  She was found to have an abnormal CT scan of the chest in May with a left mainstem bronchus apparent endobronchial lesion, right lower lobe superior segmental rounded pulmonary nodule with some central clearing.  Recommendation was made to achieve tissue diagnosis with bronchoscopy, navigation and biopsies.  The risks, benefits, complications, treatment options and expected outcomes were discussed with the patient.  The possibilities of pneumothorax, pneumonia, reaction to medication, pulmonary aspiration, perforation of a viscus, bleeding, failure to diagnose a condition and creating a complication requiring transfusion or operation were discussed with the patient who freely signed the consent.    Description of Procedure: The patient was seen in the Preoperative Area, was examined and was deemed appropriate to proceed.  The patient was taken to [facility] endoscopy room 2, identified as [name] and the procedure verified as Flexible Video Fiberoptic Bronchoscopy.  A Time Out was held and the above information confirmed.   Prior to the date of the procedure a high-resolution CT scan of the chest was performed. Utilizing Sac a virtual tracheobronchial tree was generated to allow the creation of distinct navigation pathways to the patient's parenchymal abnormalities. After being taken to the operating room general  anesthesia was initiated and the patient  was orally intubated. The video fiberoptic bronchoscope was introduced via the endotracheal tube and a general inspection was performed which showed that the left mainstem bronchus was studded with hypopigmented vascular endobronchial lesions proximal to the left upper lobe takeoff.  There was a similar endobronchial lesion noted at the very proximal right mainstem bronchus as well as in the medial aspect of the bronchus intermedius.  Under direct visualization Wang needle biopsy, endobronchial brushings and endobronchial forceps biopsies were performed in the left mainstem bronchus on the endobronchial lesions to be sent for cytology and pathology.  There was some oozing from the endobronchial airway mucosa after sampling.  16 cc of 1:20,000 dilution epinephrine was placed on the oozing mucosa in divided doses.  The extendable working channel and locator guide were introduced into the bronchoscope. The distinct navigation pathways prepared prior to this procedure were then utilized to navigate to within 0.1 cm of patient's right upper lobe pulmonary nodule identified on CT scan. The extendable working channel was secured into place and the locator guide was withdrawn. Under fluoroscopic guidance transbronchial needle brushings, transbronchial Wang needle biopsies, and transbronchial forceps biopsies were performed to be sent for cytology and pathology. A bronchioalveolar lavage was performed in the superior segment of the right lower lobe and sent for cytology and microbiology (bacterial, fungal, AFB smears and cultures).  3 fiducial markers were placed triangulating the right lower lobe pulmonary nodule to facilitate radiation therapy should this become indicated going forward.  At the end of the procedure a general airway inspection was performed and there was no evidence of active bleeding. The bronchoscope was removed.  The patient tolerated the procedure well. There  was no significant blood loss and there were no obvious complications. A post-procedural chest x-ray is pending.  Samples: 1. Transbronchial needle brushings from right lower lobe pulmonary nodule 2. Transbronchial Wang needle biopsies from right lower lobe pulmonary nodule 3. Transbronchial forceps biopsies from right lower lobe pulmonary nodule 4. Bronchoalveolar lavage from right lower lobe superior segment 5. Endobronchial forceps biopsies from left mainstem bronchus 6.  Endobronchial brushings from left mainstem bronchus 7.  Endobronchial Wang needle biopsy from the left mainstem bronchus.  Plans:  The patient will be discharged from the PACU to home when recovered from anesthesia and after chest x-ray is reviewed. We will review the cytology, pathology and microbiology results with the patient when they become available. Outpatient followup will be with Dr. Lamonte Sakai.    [name] S. 07/02/2020

## 2020-07-02 NOTE — Interval H&P Note (Signed)
History and Physical Interval Note:  07/02/2020 7:29 AM  [name]  has presented today for surgery, with the diagnosis of RIGHT LOWER LOBE PULMONARY NODULE.  The various methods of treatment have been discussed with the patient and family. After consideration of risks, benefits and other options for treatment, the patient has consented to  Procedure(s): [place] (N/A) as a surgical intervention.  The patient's history has been reviewed, patient examined, no change in status, stable for surgery.  I have reviewed the patient's chart and labs.  Questions were answered to the patient's satisfaction.     Collene Gobble

## 2020-07-02 NOTE — Anesthesia Procedure Notes (Signed)
Procedure Name: Intubation Date/Time: 07/02/2020 9:41 AM Performed by: Moshe Salisbury, CRNA Pre-anesthesia Checklist: Patient identified, Emergency Drugs available, Suction available and Patient being monitored Patient Re-evaluated:Patient Re-evaluated prior to induction Oxygen Delivery Method: Circle System Utilized Preoxygenation: Pre-oxygenation with 100% oxygen Induction Type: IV induction Ventilation: Mask ventilation without difficulty Laryngoscope Size: Mac and 3 Grade View: Grade II Tube type: Oral Tube size: 8.0 mm Number of attempts: 1 Airway Equipment and Method: Stylet Placement Confirmation: ETT inserted through vocal cords under direct vision, positive ETCO2 and breath sounds checked- equal and bilateral Secured at: 19 cm Tube secured with: Tape Dental Injury: Teeth and Oropharynx as per pre-operative assessment

## 2020-07-03 LAB — CYTOLOGY - NON PAP

## 2020-07-03 LAB — ACID FAST SMEAR (AFB, MYCOBACTERIA): Acid Fast Smear: NEGATIVE

## 2020-07-04 ENCOUNTER — Encounter (HOSPITAL_COMMUNITY): Payer: Self-pay | Admitting: Emergency Medicine

## 2020-07-04 LAB — CULTURE, BAL-QUANTITATIVE W GRAM STAIN: Culture: NO GROWTH

## 2020-07-05 ENCOUNTER — Ambulatory Visit (HOSPITAL_COMMUNITY)
Admission: RE | Admit: 2020-07-05 | Discharge: 2020-07-05 | Disposition: A | Discharge status: Home / Self Care | Payer: Medicare Other | Source: Ambulatory Visit | Attending: Emergency Medicine | Admitting: Emergency Medicine

## 2020-07-05 ENCOUNTER — Other Ambulatory Visit: Payer: Self-pay

## 2020-07-05 DIAGNOSIS — R590 Localized enlarged lymph nodes: Secondary | ICD-10-CM | POA: Diagnosis not present

## 2020-07-05 DIAGNOSIS — R911 Solitary pulmonary nodule: Secondary | ICD-10-CM | POA: Insufficient documentation

## 2020-07-05 LAB — CYTOLOGY - NON PAP

## 2020-07-05 LAB — GLUCOSE, CAPILLARY: Glucose-Capillary: 116 mg/dL — ABNORMAL HIGH (ref 70–99)

## 2020-07-05 MED ORDER — FLUDEOXYGLUCOSE F - 18 (FDG) INJECTION
7.2000 | Freq: Once | INTRAVENOUS | Status: AC
  Administered 2020-07-05: 7.2 via INTRAVENOUS

## 2020-07-06 ENCOUNTER — Telehealth: Payer: Self-pay | Admitting: Emergency Medicine

## 2020-07-06 DIAGNOSIS — C3490 Malignant neoplasm of unspecified part of unspecified bronchus or lung: Secondary | ICD-10-CM

## 2020-07-06 NOTE — Telephone Encounter (Signed)
Reviewed path results with the patient. Shows squamous cell lung CA. Pt lives in [place] but wants to be seen in [place]. Will make referral to [facility]  Also will order MRI brain

## 2020-07-07 LAB — ANAEROBIC CULTURE W GRAM STAIN

## 2020-07-09 ENCOUNTER — Encounter: Payer: Self-pay | Admitting: *Deleted

## 2020-07-09 ENCOUNTER — Telehealth: Payer: Self-pay | Admitting: Internal Medicine

## 2020-07-09 DIAGNOSIS — R911 Solitary pulmonary nodule: Secondary | ICD-10-CM

## 2020-07-09 NOTE — Telephone Encounter (Signed)
Received a new hem referral from Dr. Lamonte Sakai for new dx of lung cancer. Ms. Down has been cld and scheduled to see Dr. Julien Nordmann on 6/21 at 11:45am w/labs at 11:15am. Pt aware to arrive 15 minutes early.

## 2020-07-09 NOTE — Progress Notes (Signed)
I received referral on [name], I updated new patient coordinator to call and schedule her to be seen either today or tomorrow at Dr. Worthy Flank new patient appt. 11:45 with labs at 11:15.

## 2020-07-10 ENCOUNTER — Other Ambulatory Visit: Payer: Self-pay

## 2020-07-10 ENCOUNTER — Encounter: Payer: Self-pay | Admitting: *Deleted

## 2020-07-10 ENCOUNTER — Inpatient Hospital Stay: Payer: Medicare Other | Attending: Internal Medicine | Admitting: Internal Medicine

## 2020-07-10 ENCOUNTER — Inpatient Hospital Stay: Payer: Medicare Other

## 2020-07-10 DIAGNOSIS — C3491 Malignant neoplasm of unspecified part of right bronchus or lung: Secondary | ICD-10-CM

## 2020-07-10 DIAGNOSIS — R911 Solitary pulmonary nodule: Secondary | ICD-10-CM

## 2020-07-10 DIAGNOSIS — C778 Secondary and unspecified malignant neoplasm of lymph nodes of multiple regions: Secondary | ICD-10-CM

## 2020-07-10 DIAGNOSIS — F17210 Nicotine dependence, cigarettes, uncomplicated: Secondary | ICD-10-CM | POA: Diagnosis not present

## 2020-07-10 DIAGNOSIS — C3431 Malignant neoplasm of lower lobe, right bronchus or lung: Secondary | ICD-10-CM | POA: Insufficient documentation

## 2020-07-10 DIAGNOSIS — Z5111 Encounter for antineoplastic chemotherapy: Secondary | ICD-10-CM

## 2020-07-10 DIAGNOSIS — C3490 Malignant neoplasm of unspecified part of unspecified bronchus or lung: Secondary | ICD-10-CM

## 2020-07-10 LAB — CBC WITH DIFFERENTIAL (CANCER CENTER ONLY)
Abs Immature Granulocytes: 0.02 10*3/uL (ref 0.00–0.07)
Basophils Absolute: 0 10*3/uL (ref 0.0–0.1)
Basophils Relative: 1 %
Eosinophils Absolute: 0.3 10*3/uL (ref 0.0–0.5)
Eosinophils Relative: 3 %
HCT: 33.8 % — ABNORMAL LOW (ref 36.0–46.0)
Hemoglobin: 10.5 g/dL — ABNORMAL LOW (ref 12.0–15.0)
Immature Granulocytes: 0 %
Lymphocytes Relative: 31 %
Lymphs Abs: 2.5 10*3/uL (ref 0.7–4.0)
MCH: 27.1 pg (ref 26.0–34.0)
MCHC: 31.1 g/dL (ref 30.0–36.0)
MCV: 87.1 fL (ref 80.0–100.0)
Monocytes Absolute: 0.7 10*3/uL (ref 0.1–1.0)
Monocytes Relative: 8 %
Neutro Abs: 4.5 10*3/uL (ref 1.7–7.7)
Neutrophils Relative %: 57 %
Platelet Count: 231 10*3/uL (ref 150–400)
RBC: 3.88 MIL/uL (ref 3.87–5.11)
RDW: 15.3 % (ref 11.5–15.5)
WBC Count: 8 10*3/uL (ref 4.0–10.5)
nRBC: 0 % (ref 0.0–0.2)

## 2020-07-10 LAB — CMP (CANCER CENTER ONLY)
ALT: 18 U/L (ref 0–44)
AST: 20 U/L (ref 15–41)
Albumin: 4 g/dL (ref 3.5–5.0)
Alkaline Phosphatase: 92 U/L (ref 38–126)
Anion gap: 6 (ref 5–15)
BUN: 17 mg/dL (ref 8–23)
CO2: 21 mmol/L — ABNORMAL LOW (ref 22–32)
Calcium: 8.9 mg/dL (ref 8.9–10.3)
Chloride: 114 mmol/L — ABNORMAL HIGH (ref 98–111)
Creatinine: 0.88 mg/dL (ref 0.44–1.00)
GFR, Estimated: >60 mL/min (ref >60)
Glucose, Bld: 70 mg/dL (ref 70–99)
Potassium: 4.3 mmol/L (ref 3.5–5.1)
Sodium: 141 mmol/L (ref 135–145)
Total Bilirubin: 0.6 mg/dL (ref 0.3–1.2)
Total Protein: 7.1 g/dL (ref 6.5–8.1)

## 2020-07-10 LAB — FUNGUS STAIN

## 2020-07-10 NOTE — Progress Notes (Signed)
Oncology Nurse Navigator Documentation  Oncology Nurse Navigator Flowsheets 07/10/2020  Abnormal Finding Date 05/26/2020  Confirmed Diagnosis Date 07/02/2020  Diagnosis Status Confirmed Diagnosis Complete  Planned Course of Treatment Chemo/Radiation Concurrent  Phase of Treatment Radiation  Navigator Follow Up Date: 07/13/2020  Navigator Follow Up Reason: Appointment Review  Navigator Location [facility]  Navigator Encounter Type Clinic/MDC;Initial MedOnc  Patient Visit Type MedOnc;Initial/I spoke with patient and her husband today at their first visit with Dr. Julien Nordmann. Treatment plan is concurrent chemo rad.  Rad onc referral completed as well as MRI brain per Dr. Julien Nordmann.   Treatment Phase Pre-Tx/Tx Discussion  Barriers/Navigation Needs Coordination of Care;Education  Education Newly Diagnosed Cancer Education;Other  Interventions Coordination of Care;Education;Psycho-Social Support  Acuity Level 3-Moderate Needs (3-4 Barriers Identified)  Coordination of Care Other  Education Method Verbal;Written  Time Spent with Patient 69

## 2020-07-10 NOTE — Progress Notes (Signed)
[place] Telephone:([phone]   [phone]  CONSULT NOTE  REFERRING PHYSICIAN: Dr. Baltazar Apo  REASON FOR CONSULTATION:  80 years old white female recently diagnosed with lung cancer.  HPI [name] is a 80 y.o. female with past medical history significant for multiple medical problems including coronary artery disease status post stent placement, carotid artery disease, colon obstruction status post surgical intervention, depression, COPD, DVT, GERD, hypertension, stroke, anemia and Barrett's esophagus.  The patient was seen by her cardiologist complaining of intermittent chest pain and shortness of breath.  Because of her history of COPD and long history of smoking she was referred to pulmonary medicine for evaluation.  She had CT scan of the chest without contrast on 05/23/2020 and that showed new highly suspicious spiculated pulmonary nodule within the superior lateral aspect of the right lower lobe measuring 1.5 x 1.0 x 1.4 cm with central small cavitation highly suspicious to be a neoplastic nodule.  There was also nodularity along the upper wall of the left mainstem bronchus of uncertain etiology but also suspicious for additional neoplastic process.  She had CT super D of the chest on 06/27/2020 and that showed enlargement of the thick-walled spiculated cavitary nodule in the superior segment of the right lower lobe measuring 1.6 x 1.6.  There was also numerous mediastinal lymph nodes some of which are enlarged including high AP window lymph node measuring 1.2 cm, lower right paratracheal lymph node measuring 1.4 cm.  The patient underwent video bronchoscopy with electromagnetic navigation procedure under the care of Dr. Lamonte Sakai on 07/02/2020 and the final pathology ([id number]) showed malignant cells consistent with non-small cell carcinoma. By immunohistochemistry, the neoplastic cells are positive for cytokeratin 5/6, cytokeratin AE1/3, p40 and TTF-1  (weak).  Overall, the morphology and immunophenotype favor a squamous cell carcinoma. The patient had a PET scan on 07/05/2020 and it showed hypermetabolic right lower lobe lung lesion consistent with primary lung neoplasm.  There was also mediastinal and hilar lymphadenopathy but no findings to suggest abdominal/pelvic metastatic disease or osseous metastatic disease. Dr. Lamonte Sakai kindly referred the patient to me today for evaluation and recommendation regarding treatment of her condition. When seen today the patient is feeling fine except for cough and shortness of breath with exertion but no significant chest pain or hemoptysis.  She denied having any nausea, vomiting, diarrhea or constipation.  She has persistent headache and back pain but no visual changes.  She has no recent weight loss or night sweats. Family history significant for mother who died from a fall and father had heart disease. The patient is married and has 3 living children and 1 deceased.  She was accompanied today by her husband [name].  She used to work as an Optometrist.  The patient has a history of smoking 1 pack/day for around 61 years and quit in June 2022.  She has no history of alcohol or drug abuse.  HPI  Past Medical History:  Diagnosis Date   Acute respiratory failure with hypoxia s/p tracheostomy 07/20/2013   Anemia    a. 7-08/2013 felt due to recent critical illness/chronic disease.   Arthritis    handds, knees & back    Barrett's esophagus    CAD (coronary artery disease)    a. Mod dz 2010 initially mgd medically. b. 12/2012 - angina s/p PTCA/DES to mid-circumflex, PTCA/DES to first OM.    Carotid artery disease ([place])    a. 60-70% bilat ICA stenosis by dopplers 05/2012.  Chronic back pain    deteriorating;DDD   Colonic obstruction due to suspected colitis; s/p colectomy/colostomy    a. See SBO.   Complication of anesthesia    hard to wake up from anesthesia    COPD (chronic obstructive pulmonary disease)  (HCC)    Depression    takes Remeron and Zoloft daily   Dizziness    was on Bentyl which caused this-took off of it and no problems since   DVT of axillary vein, acute right ([place])    a. Dx 07/2013 felt due to R IJ central line that had been inserted 7/14. Not on anticoag due to GIB issues and small cerebral bleed.   GERD (gastroesophageal reflux disease)    Heart murmur    History of bronchitis    > 33yr ago    History of colon polyps    History of hiatal hernia    History of kidney stones    was told it was stable but doesn't know for sure that she ever passed it   HTN (hypertension)    Hypertension    takes Metoprolol daily   Intracranial hemorrhage ([facility]    a. 08/11/13 - per DC summary, tiny SAH vs SDH done for altered mentation, anticoag stopped including aspirin.   Joint pain    Neck pain    DDD   Other and unspecified hyperlipidemia    takes Lipitor daily   Pneumonia    hx of->15 yrs ago   Protein-calorie malnutrition, severe w/ electrolyte imbalance    a. Severe hypoalbuminemia leading to 3rd spacing including anasarca and pulm edema 07/2013.   SBO (small bowel obstruction) ([place]    a. Lysis of adhesions and ovarian cystectomy 04/2013 for sBO. b. Admitted 07/2013 for colonic obstruction due to suspected colitis, s/p partial colectomy/colostomy 08/01/13. Admission complicated by anasarca, acute resp failure requiring tracheostomy, decannulated 08/25/13. c. Recurrent SBO8/2015, NGT placed for decompression.    Stroke ([facility] early 80's   right sided weakness--TIA    Past Surgical History:  Procedure Laterality Date   ABDOMINAL HYSTERECTOMY     APPENDECTOMY     BOWEL RESECTION N/A 08/01/2013   Procedure: SMALL BOWEL RESECTION;  Surgeon: DHarl Bowie MD;  [place]  Service: General;  Laterality: N/A;   BRONCHIAL BIOPSY  07/02/2020   Procedure: BRONCHIAL BIOPSIES;  Surgeon: BCollene Gobble MD;  [facility]  Service: Pulmonary;;   BRONCHIAL BRUSHINGS   07/02/2020   Procedure: BRONCHIAL BRUSHINGS;  Surgeon: BCollene Gobble MD;  [place]  Service: Pulmonary;;   BRONCHIAL NEEDLE ASPIRATION BIOPSY  07/02/2020   Procedure: BRONCHIAL NEEDLE ASPIRATION BIOPSIES;  Surgeon: BCollene Gobble MD;  [facility]  Service: Pulmonary;;   BRONCHIAL WASHINGS  07/02/2020   Procedure: BRONCHIAL WASHINGS;  Surgeon: BCollene Gobble MD;  [facility];  Service: Pulmonary;;   cataract surgery Bilateral    CHOLECYSTECTOMY     COLON RESECTION     COLON SURGERY     COLOSTOMY N/A 08/01/2013   Procedure: COLOSTOMY;  Surgeon: DHarl Bowie MD;  Location: [facility]  Service: General;  Laterality: N/A;   COLOSTOMY TAKEDOWN  01/24/2014   dr blackman   COLOSTOMY TAKEDOWN N/A 01/24/2014   Procedure: COLOSTOMY TAKEDOWN;  Surgeon: DCoralie Keens MD;  [place] [facility]  Service: General;  Laterality: N/A;   CORONARY ANGIOPLASTY WITH STENT PLACEMENT  12/20/2012   STENT TO OM         DR CBurt Knack  ENDARTERECTOMY Right 08/08/2014   Procedure: RIGHT CAROTID ENDARTERECTOMY WITH HEMASHIELD PATCH ANGIOPLASTY;  Surgeon: Elam Dutch, MD;  Location: [place];  Service: Vascular;  Laterality: Right;   FIDUCIAL MARKER PLACEMENT  07/02/2020   Procedure: FIDUCIAL MARKER PLACEMENT;  Surgeon: Collene Gobble, MD;  Location: [place] [facility];  Service: Pulmonary;;   FOOT SURGERY Left    HAND SURGERY Bilateral    for ganglion cysts   HEMOSTASIS CONTROL  07/02/2020   Procedure: HEMOSTASIS CONTROL;  Surgeon: Collene Gobble, MD;  [facility];  Service: Pulmonary;;  ice saline and epi   LAPAROTOMY N/A 08/01/2013   Procedure: EXPLORATORY LAPAROTOMY;  Surgeon: Harl Bowie, MD;  Location: [place];  Service: General;  Laterality: N/A;   LEFT HEART CATH AND CORONARY ANGIOGRAPHY N/A 01/10/2019   Procedure: LEFT HEART CATH AND CORONARY ANGIOGRAPHY;  Surgeon: Troy Sine, MD;  Location: [place] CV LAB;  Service: Cardiovascular;  Laterality: N/A;    LEFT HEART CATHETERIZATION WITH CORONARY ANGIOGRAM N/A 12/20/2012   Procedure: LEFT HEART CATHETERIZATION WITH CORONARY ANGIOGRAM;  Surgeon: Blane Ohara, MD;  Location: [facility] CATH LAB;  Service: Cardiovascular;  Laterality: N/A;   PARTIAL COLECTOMY N/A 08/01/2013   Procedure: PARTIAL COLECTOMY;  Surgeon: Harl Bowie, MD;  Location: [place];  Service: General;  Laterality: N/A;   ROTATOR CUFF REPAIR Left    TONSILLECTOMY     TRACHEOSTOMY     [facility]   VIDEO BRONCHOSCOPY WITH ENDOBRONCHIAL NAVIGATION N/A 07/02/2020   Procedure: VIDEO BRONCHOSCOPY WITH ENDOBRONCHIAL NAVIGATION;  Surgeon: Collene Gobble, MD;  Location: [place] ENDOSCOPY;  Service: Pulmonary;  Laterality: N/A;    Family History  Problem Relation Age of Onset   Varicose Veins Mother    Heart disease Father    Heart attack Father    AAA (abdominal aortic aneurysm) Father    Heart disease Other     Social History Social History   Tobacco Use   Smoking status: Some Days    Packs/day: 0.25    Years: 53.00    Pack years: 13.25    Types: Cigarettes   Smokeless tobacco: Never   Tobacco comments:    10 cigarettes smokes daily ARJ 06/21/20  Vaping Use   Vaping Use: Never used  Substance Use Topics   Alcohol use: No    Alcohol/week: 0.0 standard drinks   Drug use: No    Allergies  Allergen Reactions   Pregabalin Swelling    Tongue swelling   Sulfonamide Derivatives Swelling    Childhood reaction    Current Outpatient Medications  Medication Sig Dispense Refill   aspirin EC 81 MG tablet Take 2 tablets (162 mg total) by mouth in the morning. Okay to restart this medication on 07/04/2020 30 tablet 11   atorvastatin (LIPITOR) 80 MG tablet Take 80 mg by mouth every evening.      buPROPion (WELLBUTRIN XL) 150 MG 24 hr tablet Take 1 tablet (150 mg total) by mouth daily. 90 tablet 3   CALCIUM PO Take 1 tablet by mouth in the morning.     Cholecalciferol (VITAMIN D3 PO) Take 1 tablet by mouth in the morning.     fish  oil-omega-3 fatty acids 1000 MG capsule Take 1,000 mg by mouth in the morning.     fluticasone (FLONASE) 50 MCG/ACT nasal spray Place 1 spray into both nostrils daily as needed for allergies.     folic acid (FOLVITE) 656 MCG tablet Take 800 mcg by mouth in the  morning.     metoprolol succinate (TOPROL-XL) 50 MG 24 hr tablet TAKE 1 TABLET BY MOUTH  DAILY 90 tablet 3   mirtazapine (REMERON) 45 MG tablet Take 45 mg by mouth at bedtime.      nitroGLYCERIN (NITROSTAT) 0.4 MG SL tablet DISSOLVE 1 TABLET UNDER THE TONGUE EVERY 5 MINUTES AS  NEEDED FOR CHEST PAIN. MAX  OF 3 TABLETS IN 15 MINUTES. CALL 911 IF PAIN PERSISTS. 25 tablet 3   ondansetron (ZOFRAN) 4 MG tablet Take 4 mg by mouth every 8 (eight) hours as needed for nausea or vomiting.      pantoprazole (PROTONIX) 40 MG tablet Take 40 mg by mouth in the morning.     sertraline (ZOLOFT) 100 MG tablet Take 200 mg by mouth in the morning. 100 mg + 100 mg= 200 mg daily     sertraline (ZOLOFT) 50 MG tablet Take 50 mg by mouth in the morning. 50 mg + 100 mg=150 mg daily (Patient not taking: Reported on 07/10/2020)     No current facility-administered medications for this visit.    Review of Systems  Constitutional: positive for fatigue Eyes: negative Ears, nose, mouth, throat, and face: negative Respiratory: positive for cough and dyspnea on exertion Cardiovascular: negative Gastrointestinal: negative Genitourinary:negative Integument/breast: negative Hematologic/lymphatic: negative Musculoskeletal:positive for back pain Neurological: positive for headaches Behavioral/Psych: negative Endocrine: negative Allergic/Immunologic: negative  Physical Exam  EYC:XKGYJ, healthy, no distress, well nourished, well developed, and anxious SKIN: skin color, texture, turgor are normal, no rashes or significant lesions HEAD: Normocephalic, No masses, lesions, tenderness or abnormalities EYES: normal, PERRLA EARS: External ears normal, Canals  clear OROPHARYNX:no exudate, no erythema, and lips, buccal mucosa, and tongue normal  NECK: supple, no adenopathy, no JVD LYMPH:  no palpable lymphadenopathy, no hepatosplenomegaly BREAST:not examined LUNGS: clear to auscultation , and palpation HEART: regular rate & rhythm, no murmurs, and no gallops ABDOMEN:abdomen soft, non-tender, normal bowel sounds, and no masses or organomegaly BACK: No CVA tenderness, Range of motion is normal EXTREMITIES:no joint deformities, effusion, or inflammation, no edema  NEURO: alert & oriented x 3 with fluent speech, no focal motor/sensory deficits  PERFORMANCE STATUS: ECOG 1  LABORATORY DATA: Lab Results  Component Value Date   WBC 8.0 07/10/2020   HGB 10.5 (L) 07/10/2020   HCT 33.8 (L) 07/10/2020   MCV 87.1 07/10/2020   PLT 231 07/10/2020      Chemistry      Component Value Date/Time   NA 142 06/19/2020 1506   NA 139 01/25/2019 1036   K 4.3 06/19/2020 1506   CL 107 06/19/2020 1506   CO2 27 06/19/2020 1506   BUN 15 06/19/2020 1506   BUN 12 01/25/2019 1036   CREATININE 0.98 06/19/2020 1506      Component Value Date/Time   CALCIUM 9.9 06/19/2020 1506   ALKPHOS 102 06/19/2020 1506   AST 15 06/19/2020 1506   ALT 12 06/19/2020 1506   BILITOT 0.5 06/19/2020 1506   BILITOT 0.4 01/25/2019 1036       RADIOGRAPHIC STUDIES: DG Cervical Spine Complete  Result Date: 06/20/2020 CLINICAL DATA:  Chronic neck pain EXAM: CERVICAL SPINE - COMPLETE 4+ VIEW COMPARISON:  01/23/2015 FINDINGS: Frontal, bilateral oblique, lateral views of the cervical spine are obtained. Alignment is anatomic to the cervicothoracic junction. Multilevel facet hypertrophic changes are seen throughout the cervical spine, grossly stable since prior study. Disc spaces are relatively well preserved. There is bilateral neural foraminal narrowing at C4-5 and C5-6. No acute fractures. Prevertebral soft  tissues are unremarkable. Lung apices are clear. IMPRESSION: 1. Multilevel  cervical facet hypertrophic changes, grossly stable since prior exam. No acute bony abnormality. Electronically Signed   By: Randa Ngo M.D.   On: 06/20/2020 00:25   DG Thoracic Spine 2 View  Result Date: 06/20/2020 CLINICAL DATA:  Thoracic spine pain EXAM: THORACIC SPINE 2 VIEWS COMPARISON:  05/18/2019 FINDINGS: Frontal and lateral views of the thoracic spine demonstrate stable anatomic alignment. There are no acute displaced fractures. Diffuse thoracic spondylosis is again identified, with prominent disc space narrowing and anterior osteophyte formation extending to approximately the T10 level. Paraspinal soft tissues are unremarkable. Small left pleural effusion. IMPRESSION: 1. Stable extensive multilevel thoracic spondylosis. No acute fracture. 2. Left pleural effusion. Electronically Signed   By: Randa Ngo M.D.   On: 06/20/2020 00:20   DG Lumbar Spine 2-3 Views  Result Date: 06/19/2020 CLINICAL DATA:  Chronic lumbar spine pain EXAM: LUMBAR SPINE - 2-3 VIEW COMPARISON:  10/09/2010 FINDINGS: Frontal and lateral views of the lumbar spine are obtained. There are 5 non-rib-bearing lumbar type vertebral bodies with stable left convex scoliosis centered at L3. Otherwise alignment is anatomic. Mild disc space narrowing and anterior osteophyte formation at L4-5 and L5-S1. Stable facet hypertrophic changes from L3 through S1. No acute fractures. Sacroiliac joints are normal. IMPRESSION: 1. Stable lower lumbar spondylosis and facet hypertrophy. 2. Stable left convex lumbar scoliosis. Electronically Signed   By: Randa Ngo M.D.   On: 06/19/2020 23:38   DG Pelvis 1-2 Views  Result Date: 06/19/2020 CLINICAL DATA:  Bilateral hip pain EXAM: PELVIS - 1-2 VIEW COMPARISON:  09/25/2017 FINDINGS: Single frontal view of the pelvis demonstrates no acute displaced fractures. Alignment is anatomic. Joint spaces are well preserved. Sacroiliac joints are normal. IMPRESSION: 1. Unremarkable pelvis. Electronically  Signed   By: Randa Ngo M.D.   On: 06/19/2020 23:44   NM PET Image Initial (PI) Skull Base To Thigh  Result Date: 07/05/2020 CLINICAL DATA:  Initial treatment strategy for right lower lobe lung lesion. EXAM: NUCLEAR MEDICINE PET SKULL BASE TO THIGH TECHNIQUE: 7.2 mCi F-18 FDG was injected intravenously. Full-ring PET imaging was performed from the skull base to thigh after the radiotracer. CT data was obtained and used for attenuation correction and anatomic localization. Fasting blood glucose: 116 mg/dl COMPARISON:  Chest CT 06/27/2020 FINDINGS: Mediastinal blood pool activity: SUV max 2.03 Liver activity: SUV max NA NECK: No hypermetabolic lymph nodes in the neck. Incidental CT findings: Significant muscular uptake is noted in the neck and upper chest area which could be due to the patient taking insulin, eating recently or possibly related to exercise. CHEST: The 2.4 cm right lower lobe partially cavitary mass is hypermetabolic with SUV max of 9.21. There are also ipsilateral contralateral hypermetabolic mediastinal nodes. The subcarinal node has an SUV max 4.76. Right hilar node has an SUV max of 3.35. Pretracheal has an SUV max of 5.22. Prevascular node has an SUV max of 5.47. No other hypermetabolic pulmonary lesions. Incidental CT findings: Stable cardiac enlargement and aortic and coronary artery calcifications. Stable significant underlying lung disease/emphysema. ABDOMEN/PELVIS: No PET-CT findings suspicious for hepatic or adrenal gland metastasis. No abdominal or pelvic hypermetabolic lymphadenopathy. Incidental CT findings: Advanced aortic and branch vessel calcifications but no aneurysm. The gallbladder is surgically absent. Anterior abdominal wall hernia contains small bowel and colon but no obstructive findings. Cystic area in the left inguinal area could be postoperative change. No hypermetabolism. SKELETON: Significant diffuse muscular uptake but no worrisome muscle  lesions. No findings  suspicious for osseous metastatic disease. Incidental CT findings: none IMPRESSION: 1. Hypermetabolic right lower lobe lung lesion consistent with primary lung neoplasm. 2. Mediastinal and hilar lymphadenopathy as detailed above. 3. No findings to suggest abdominal/pelvic metastatic disease or osseous metastatic disease. Electronically Signed   By: Marijo Sanes M.D.   On: 07/05/2020 16:52   DG Chest Port 1 View  Result Date: 07/02/2020 CLINICAL DATA:  Status post bronchoscopy EXAM: PORTABLE CHEST 1 VIEW COMPARISON:  06/27/20 FINDINGS: Cardiac shadow is enlarged but stable. Aortic calcifications are noted. Increased density is noted in the right lung base consistent with parenchymal hemorrhage related to the recent bronchoscopy and biopsy. Fiducial markers are noted on the right thorax is seen. IMPRESSION: No evidence of post bronchoscopy pneumothorax. Increased density in the right base is noted related parenchymal hemorrhage from the recent bronchoscopy. Electronically Signed   By: Inez Catalina M.D.   On: 07/02/2020 11:45   CT Super D Chest Wo Contrast  Result Date: 06/28/2020 CLINICAL DATA:  Pulmonary nodule. EXAM: CT CHEST WITHOUT CONTRAST TECHNIQUE: Multidetector CT imaging of the chest was performed using thin slice collimation for electromagnetic bronchoscopy planning purposes, without intravenous contrast. COMPARISON:  05/23/2020 and 10/21/2013. FINDINGS: Cardiovascular: Atherosclerotic calcification of the aorta, aortic valve and coronary arteries. Pulmonic trunk and heart are enlarged. No pericardial effusion. Mediastinum/Nodes: Numerous mediastinal lymph nodes, some of which are enlarged. High AP window lymph node measures 12 mm, similar. Low right paratracheal lymph node measures 14 mm, also similar when remeasured. Hilar regions are difficult to definitively evaluate without IV contrast. No axillary adenopathy. Esophagus is grossly unremarkable. Small hiatal hernia. Lungs/Pleura: Biapical  pleuroparenchymal scarring. Centrilobular emphysema. Thick-walled spiculated cavitary nodule in the superior segment right lower lobe measures 1.6 x 1.6 cm (3/80), enlarged from 1.0 x 1.4 cm on 05/23/2020. Scattered subsegmental atelectasis and/or scarring in the lower lobes. Calcified granulomas. Trace left pleural fluid. Nodular soft tissue in the distal left mainstem bronchus is unchanged. Upper Abdomen: Visualized portions of the liver, adrenal glands, kidneys, spleen, pancreas, stomach and bowel are unremarkable with the exception of a small hiatal hernia. Cholecystectomy. No upper abdominal adenopathy. Small midline ventral hernia contains fat. Musculoskeletal: Degenerative changes in the spine. No worrisome lytic or sclerotic lesions. IMPRESSION: 1. Enlarging thick-walled spiculated right lower lobe nodule, worrisome for primary bronchogenic carcinoma. An infectious etiology is not excluded. 2. Bilateral mediastinal adenopathy, possibly metastatic. 3. Nodular soft tissue in the left mainstem bronchus, as on 05/23/2020, worrisome for malignancy. This would be amenable to direct visualization via bronchoscopy, as clinically indicated. 4. Trace left pleural fluid. 5. Aortic atherosclerosis (ICD10-I70.0). Coronary artery calcification. 6. Enlarged pulmonic trunk, indicative of pulmonary arterial hypertension. 7.  Emphysema (ICD10-J43.9). Electronically Signed   By: Lorin Picket M.D.   On: 06/28/2020 11:02   DG C-ARM BRONCHOSCOPY  Result Date: 07/02/2020 C-ARM BRONCHOSCOPY: Fluoroscopy was utilized by the requesting physician.  No radiographic interpretation.    ASSESSMENT: This is a very pleasant 80 years old white female recently diagnosed with stage IIIB (T1b, N3, M0) non-small cell lung cancer, squamous cell carcinoma presented with right lower lobe lung nodule in addition to bilateral mediastinal and right hilar lymphadenopathy diagnosed in June 2022.   PLAN: I had a lengthy discussion with the  patient and her husband today about her current disease stage, prognosis and treatment options. I personally and independently reviewed the scan images and discussed the result and showed the images to the patient and her husband today. I  recommended for the patient to complete the staging work-up by ordering MRI of the brain to rule out brain metastasis. I will also ask the pathology department to send her tissue block for PD-L1 expression. I discussed with the patient the option of treatment of her condition I recommended for her a course of concurrent chemoradiation with weekly carboplatin for AUC of 2 and paclitaxel 45 Mg/M2 for 6-7 weeks followed by restaging scan and if no evidence for disease progression, the patient may benefit from consolidation treatment with immunotherapy. I discussed with the patient the adverse effect of the chemotherapy including but not limited to alopecia, myelosuppression, nausea and vomiting, peripheral neuropathy, liver or renal dysfunction. The patient is interested in proceeding with the treatment. I gave her the option of treatment at the [place] [facility] in [place] but the patient declined and she would like to continue her treatment in [place]. She is expected to start the first dose of her treatment on the week of July 4 or if not visible we will do it the following week. I will refer the patient to radiation oncology for discussion of the radiotherapy option. I will arrange for the patient to have a chemotherapy education class before the first dose of her treatment. I will call her pharmacy with prescription for Compazine 10 mg p.o. every 6 hours as needed for nausea. The patient will come back for follow-up visit on July 30, 2020. For the smoking cessation I strongly encouraged the patient to continue with the quitting of the smoking. She was advised to call immediately if she has any other concerning symptoms in the interval. The patient voices  understanding of current disease status and treatment options and is in agreement with the current care plan.  All questions were answered. The patient knows to call the clinic with any problems, questions or concerns. We can certainly see the patient much sooner if necessary.  Thank you so much for allowing me to participate in the care of [name]. I will continue to follow up the patient with you and assist in her care.  The total time spent in the appointment was 90 minutes.  Disclaimer: This note was dictated with voice recognition software. Similar sounding words can inadvertently be transcribed and may not be corrected upon review.   Eilleen Kempf July 10, 2020, 12:45 PM

## 2020-07-10 NOTE — Progress Notes (Signed)
START ON PATHWAY REGIMEN - Non-Small Cell Lung     Administer weekly:     Paclitaxel      Carboplatin   **Always confirm dose/schedule in your pharmacy ordering system**  Patient Characteristics: Preoperative or Nonsurgical Candidate (Clinical Staging), Stage III - Nonsurgical Candidate (Nonsquamous and Squamous), PS = 0, 1 Therapeutic Status: Preoperative or Nonsurgical Candidate (Clinical Staging) AJCC T Category: cT1b AJCC N Category: cN3 AJCC M Category: cM0 AJCC 8 Stage Grouping: IIIB ECOG Performance Status: 1 Intent of Therapy: Curative Intent, Discussed with Patient

## 2020-07-11 ENCOUNTER — Encounter: Payer: Self-pay | Admitting: *Deleted

## 2020-07-11 NOTE — Progress Notes (Signed)
I followed up on [name]'s schedule. She is set up for her MRI brain scan but has not been scheduled with rad onc. I reached out to the rad onc scheduling team with an update.

## 2020-07-11 NOTE — Progress Notes (Signed)
Per Dr. Julien Nordmann, I notified pathology of his request to send PDL 1 testing on recent bx 07/02/20.

## 2020-07-12 ENCOUNTER — Other Ambulatory Visit: Payer: Self-pay

## 2020-07-12 ENCOUNTER — Telehealth: Payer: Self-pay | Admitting: Internal Medicine

## 2020-07-12 ENCOUNTER — Other Ambulatory Visit: Payer: Self-pay | Admitting: *Deleted

## 2020-07-12 NOTE — Progress Notes (Signed)
The proposed treatment discussed in cancer conference 07/12/20 is for discussion purpose only and is not a binding recommendation.  The patient was not physically examined nor present for their treatment options. Therefore, final treatment plans cannot be decided.

## 2020-07-12 NOTE — Telephone Encounter (Signed)
Scheduled per los. Called and spoke with patient. Confirmed appts  

## 2020-07-13 ENCOUNTER — Encounter: Payer: Self-pay | Admitting: *Deleted

## 2020-07-13 NOTE — Progress Notes (Signed)
I followed up on [name]'s schedule. She is set up for her tx plan at this time.

## 2020-07-16 NOTE — Progress Notes (Signed)
[facility] [address] [place] Phone: [phone] Subjective:   I Kandace Blitz am serving as a Education administrator for Dr. Hulan Saas.  This visit occurred during the SARS-CoV-2 public health emergency.  Safety protocols were in place, including screening questions prior to the visit, additional usage of staff PPE, and extensive cleaning of exam room while observing appropriate contact time as indicated for disinfecting solutions.   I'm seeing this patient by the request  of:  [name], MD  CC: Pain all over follow-up.  [id number]  06/19/2020 Patient does have some physical deconditioning.  Has had this for quite some time though when reviewing patient's chart  As stated with what we have seen in the thoracic area in the lumbar spine patient does have arthritic changes of the neck as well.  Patient declined formal physical therapy.  We will get also some laboratory work-up to further evaluate.  Patient will be seen pulmonology in the next couple days but will send them a note as well to see if any other test such as MRI or bone scan would be appropriate at this time.  I believe the patient is going to have fairly significant degenerative disc disease of the lumbar spine.  We discussed potential formal physical therapy which patient declined.  X-rays were taken today and independently visualized by me of the lumbar, thoracic and cervical that did not show any true mass but patient does have significant arthritic changes in multiple levels.  We discussed with patient having the nodule about something such as a bone scan or possible MRI would be necessary.  Patient wants to try the home exercises to see how patient responds.  I would like to keep a close eye on patient over and see her again in 4 weeks.  We will work with cardiology as well as her primary care and pulmonology to further evaluate and treat her appropriately.  Update  07/17/2020 [name] is a 80 y.o. female coming in with complaint of B hip, cervical, thoracic, and lumbar spine pain. Patient states that she is ok today. Believes she has made some improvements.  Last time we saw patient there was a concern for a pulmonary nodule.  Was to get a bone scan and patient did.  Found symptoms that were concerning for a primary malignancy of the lungs.  Has seen pulmonology and has been diagnosed.  Reviewing patient's chart in its entirety patient is hooked up with the tumor board and will be undergoing treatment.  Has seen oncology as well.  Patient states that overall pain Seems to be a little bit better.  Does not know exactly what has been helping.  Has not been quite as active.  Reviewed patient's imaging including most recent CT scan of the chest showed a enlarging right lower lobe nodule as well as bilateral mediastinal adenopathy with a left mainstem bronchus with soft tissue nodularity as well. Patient did have the bone scan July 05, 2020 showing the patient did have a hypermetabolic right lower lobe lung lesion and the mediastinal hilar lymphadenopathy but otherwise no significant metastatic findings. Past Medical History:  Diagnosis Date   Acute respiratory failure with hypoxia s/p tracheostomy 07/20/2013   Anemia    a. 7-08/2013 felt due to recent critical illness/chronic disease.   Arthritis    handds, knees & back    Barrett's esophagus    CAD (coronary artery disease)    a. Mod dz 2010  initially mgd medically. b. 12/2012 - angina s/p PTCA/DES to mid-circumflex, PTCA/DES to first OM.    Carotid artery disease ([place])    a. 60-70% bilat ICA stenosis by dopplers 05/2012.   Chronic back pain    deteriorating;DDD   Colonic obstruction due to suspected colitis; s/p colectomy/colostomy    a. See SBO.   Complication of anesthesia    hard to wake up from anesthesia    COPD (chronic obstructive pulmonary disease) (HCC)    Depression    takes Remeron  and Zoloft daily   Dizziness    was on Bentyl which caused this-took off of it and no problems since   DVT of axillary vein, acute right ([place])    a. Dx 07/2013 felt due to R IJ central line that had been inserted 7/14. Not on anticoag due to GIB issues and small cerebral bleed.   GERD (gastroesophageal reflux disease)    Heart murmur    History of bronchitis    > 36yrs ago    History of colon polyps    History of hiatal hernia    History of kidney stones    was told it was stable but doesn't know for sure that she ever passed it   HTN (hypertension)    Hypertension    takes Metoprolol daily   Intracranial hemorrhage ([place])    a. 08/11/13 - per DC summary, tiny SAH vs SDH done for altered mentation, anticoag stopped including aspirin.   Joint pain    Neck pain    DDD   Other and unspecified hyperlipidemia    takes Lipitor daily   Pneumonia    hx of->15 yrs ago   Protein-calorie malnutrition, severe w/ electrolyte imbalance    a. Severe hypoalbuminemia leading to 3rd spacing including anasarca and pulm edema 07/2013.   SBO (small bowel obstruction) ([place])    a. Lysis of adhesions and ovarian cystectomy 04/2013 for sBO. b. Admitted 07/2013 for colonic obstruction due to suspected colitis, s/p partial colectomy/colostomy 08/01/13. Admission complicated by anasarca, acute resp failure requiring tracheostomy, decannulated 08/25/13. c. Recurrent SBO8/2015, NGT placed for decompression.    Stroke [facility]) early 80's   right sided weakness--TIA   Past Surgical History:  Procedure Laterality Date   ABDOMINAL HYSTERECTOMY     APPENDECTOMY     BOWEL RESECTION N/A 08/01/2013   Procedure: SMALL BOWEL RESECTION;  Surgeon: Harl Bowie, MD;  Location: [place];  Service: General;  Laterality: N/A;   BRONCHIAL BIOPSY  07/02/2020   Procedure: BRONCHIAL BIOPSIES;  Surgeon: Collene Gobble, MD;  Location: [facility] ENDOSCOPY;  Service: Pulmonary;;   BRONCHIAL BRUSHINGS  07/02/2020   Procedure: BRONCHIAL BRUSHINGS;   Surgeon: Collene Gobble, MD;  Location: [facility] ENDOSCOPY;  Service: Pulmonary;;   BRONCHIAL NEEDLE ASPIRATION BIOPSY  07/02/2020   Procedure: BRONCHIAL NEEDLE ASPIRATION BIOPSIES;  Surgeon: Collene Gobble, MD;  Location: [facility] ENDOSCOPY;  Service: Pulmonary;;   BRONCHIAL WASHINGS  07/02/2020   Procedure: BRONCHIAL WASHINGS;  Surgeon: Collene Gobble, MD;  Location: [facility] ENDOSCOPY;  Service: Pulmonary;;   cataract surgery Bilateral    CHOLECYSTECTOMY     COLON RESECTION     COLON SURGERY     COLOSTOMY N/A 08/01/2013   Procedure: COLOSTOMY;  Surgeon: Harl Bowie, MD;  Location: [place];  Service: General;  Laterality: N/A;   COLOSTOMY TAKEDOWN  01/24/2014   dr blackman   COLOSTOMY TAKEDOWN N/A 01/24/2014   Procedure: COLOSTOMY TAKEDOWN;  Surgeon: Coralie Keens, MD;  Location: [place];  Service: General;  Laterality: N/A;   CORONARY ANGIOPLASTY WITH STENT PLACEMENT  12/20/2012   STENT TO OM         DR COOPER   ENDARTERECTOMY Right 08/08/2014   Procedure: RIGHT CAROTID ENDARTERECTOMY WITH HEMASHIELD PATCH ANGIOPLASTY;  Surgeon: Elam Dutch, MD;  Location: [place];  Service: Vascular;  Laterality: Right;   FIDUCIAL MARKER PLACEMENT  07/02/2020   Procedure: FIDUCIAL MARKER PLACEMENT;  Surgeon: Collene Gobble, MD;  Location: [place] ENDOSCOPY;  Service: Pulmonary;;   FOOT SURGERY Left    HAND SURGERY Bilateral    for ganglion cysts   HEMOSTASIS CONTROL  07/02/2020   Procedure: HEMOSTASIS CONTROL;  Surgeon: Collene Gobble, MD;  Location: [facility] ENDOSCOPY;  Service: Pulmonary;;  ice saline and epi   LAPAROTOMY N/A 08/01/2013   Procedure: EXPLORATORY LAPAROTOMY;  Surgeon: Harl Bowie, MD;  Location: [place];  Service: General;  Laterality: N/A;   LEFT HEART CATH AND CORONARY ANGIOGRAPHY N/A 01/10/2019   Procedure: LEFT HEART CATH AND CORONARY ANGIOGRAPHY;  Surgeon: Troy Sine, MD;  Location: [place] CV LAB;  Service: Cardiovascular;  Laterality: N/A;   LEFT HEART CATHETERIZATION WITH CORONARY  ANGIOGRAM N/A 12/20/2012   Procedure: LEFT HEART CATHETERIZATION WITH CORONARY ANGIOGRAM;  Surgeon: Blane Ohara, MD;  Location: [facility] CATH LAB;  Service: Cardiovascular;  Laterality: N/A;   PARTIAL COLECTOMY N/A 08/01/2013   Procedure: PARTIAL COLECTOMY;  Surgeon: Harl Bowie, MD;  Location: [place];  Service: General;  Laterality: N/A;   ROTATOR CUFF REPAIR Left    TONSILLECTOMY     TRACHEOSTOMY     feinstein   VIDEO BRONCHOSCOPY WITH ENDOBRONCHIAL NAVIGATION N/A 07/02/2020   Procedure: VIDEO BRONCHOSCOPY WITH ENDOBRONCHIAL NAVIGATION;  Surgeon: Collene Gobble, MD;  Location: [place] ENDOSCOPY;  Service: Pulmonary;  Laterality: N/A;   Social History   Socioeconomic History   Marital status: Married    Spouse name: Not on file   Number of children: Not on file   Years of education: Not on file   Highest education level: Not on file  Occupational History   Occupation: full time  Tobacco Use   Smoking status: Some Days    Packs/day: 0.25    Years: 53.00    Pack years: 13.25    Types: Cigarettes   Smokeless tobacco: Never   Tobacco comments:    10 cigarettes smokes daily ARJ 06/21/20  Vaping Use   Vaping Use: Never used  Substance and Sexual Activity   Alcohol use: No    Alcohol/week: 0.0 standard drinks   Drug use: No   Sexual activity: Not Currently    Birth control/protection: Surgical    Comment: Hysterectomy  Other Topics Concern   Not on file  Social History Narrative   Not on file   Social Determinants of Health   Financial Resource Strain: Not on file  Food Insecurity: Not on file  Transportation Needs: Not on file  Physical Activity: Not on file  Stress: Not on file  Social Connections: Not on file   Allergies  Allergen Reactions   Pregabalin Swelling    Tongue swelling   Sulfonamide Derivatives Swelling    Childhood reaction   Family History  Problem Relation Age of Onset   Varicose Veins Mother    Heart disease Father    Heart attack Father     AAA (abdominal aortic aneurysm) Father    Heart disease Other      Current Outpatient  Medications (Cardiovascular):    atorvastatin (LIPITOR) 80 MG tablet, Take 80 mg by mouth every evening.    metoprolol succinate (TOPROL-XL) 50 MG 24 hr tablet, TAKE 1 TABLET BY MOUTH  DAILY   nitroGLYCERIN (NITROSTAT) 0.4 MG SL tablet, DISSOLVE 1 TABLET UNDER THE TONGUE EVERY 5 MINUTES AS  NEEDED FOR CHEST PAIN. MAX  OF 3 TABLETS IN 15 MINUTES. CALL 911 IF PAIN PERSISTS.  Current Outpatient Medications (Respiratory):    fluticasone (FLONASE) 50 MCG/ACT nasal spray, Place 1 spray into both nostrils daily as needed for allergies.  Current Outpatient Medications (Analgesics):    aspirin EC 81 MG tablet, Take 2 tablets (162 mg total) by mouth in the morning. Okay to restart this medication on 07/04/2020  Current Outpatient Medications (Hematological):    folic acid (FOLVITE) 347 MCG tablet, Take 800 mcg by mouth in the morning.  Current Outpatient Medications (Other):    buPROPion (WELLBUTRIN XL) 150 MG 24 hr tablet, Take 1 tablet (150 mg total) by mouth daily.   CALCIUM PO, Take 1 tablet by mouth in the morning.   Cholecalciferol (VITAMIN D3 PO), Take 1 tablet by mouth in the morning.   fish oil-omega-3 fatty acids 1000 MG capsule, Take 1,000 mg by mouth in the morning.   mirtazapine (REMERON) 45 MG tablet, Take 45 mg by mouth at bedtime.    ondansetron (ZOFRAN) 4 MG tablet, Take 4 mg by mouth every 8 (eight) hours as needed for nausea or vomiting.    pantoprazole (PROTONIX) 40 MG tablet, Take 40 mg by mouth in the morning.   sertraline (ZOLOFT) 100 MG tablet, Take 200 mg by mouth in the morning. 100 mg + 100 mg= 200 mg daily   sertraline (ZOLOFT) 50 MG tablet, Take 50 mg by mouth in the morning. 50 mg + 100 mg=150 mg daily   Reviewed prior external information including notes and imaging from  primary care provider As well as notes that were available from care everywhere and other healthcare  systems.  Past medical history, social, surgical and family history all reviewed in electronic medical record.  No pertanent information unless stated regarding to the chief complaint.   Review of Systems:  No headache, visual changes, nausea, vomiting, diarrhea, constipation, dizziness, abdominal pain, skin rash, fevers, chills, night sweats,  swollen lymph nodes, joint swelling, chest pain,  mood changes. POSITIVE muscle aches, body aches  Objective  Blood pressure 110/60, pulse 82, height 4\' 11"  (1.499 m), weight 150 lb (68 kg), SpO2 97 %.   General: No apparent distress alert and oriented x3 mood and affect normal, dressed appropriately.  HEENT: Pupils equal, extraocular movements intact  Respiratory: Patient's speak in full sentences and does not appear short of breath at baseline at the moment. Cardiovascular: Trace lower extremity edema, non tender, no erythema  Gait antalgic Patient's loss of lordosis of the lumbar spine.  Patient is comfortable sitting in her chair.    Impression and Recommendations:    The above documentation has been reviewed and is accurate and complete Lyndal Pulley, DO

## 2020-07-17 ENCOUNTER — Other Ambulatory Visit: Payer: Self-pay

## 2020-07-17 ENCOUNTER — Encounter: Payer: Self-pay | Admitting: Family Medicine

## 2020-07-17 ENCOUNTER — Ambulatory Visit (INDEPENDENT_AMBULATORY_CARE_PROVIDER_SITE_OTHER): Payer: Medicare Other | Admitting: Family Medicine

## 2020-07-17 DIAGNOSIS — M5136 Other intervertebral disc degeneration, lumbar region: Secondary | ICD-10-CM

## 2020-07-17 NOTE — Patient Instructions (Addendum)
Good to see you Very glad you are feeling better You are in great hands No follow up for now I am here if you need me

## 2020-07-17 NOTE — Assessment & Plan Note (Signed)
Patient does have degenerative disc disease of multiple joints.  Has many different comorbidities and is currently being treated for a primary squamous cell carcinoma of the potentially contributing to inflammation overall.  We will not do any significant medical treatment for the arthritic pain and instead focus more on strengthening and range of motion exercises.  Patient would like to hold on physical therapy until further treatment delineation is accompanied with her cancer.  At this point patient can follow-up with me again later on and we will discuss more aggressive therapies later.  Patient as well as her significant other questions were answered today we will follow-up with me as needed.

## 2020-07-18 ENCOUNTER — Ambulatory Visit (HOSPITAL_COMMUNITY)
Admission: RE | Admit: 2020-07-18 | Discharge: 2020-07-18 | Disposition: A | Discharge status: Home / Self Care | Payer: Medicare Other | Source: Ambulatory Visit | Attending: Internal Medicine | Admitting: Internal Medicine

## 2020-07-18 DIAGNOSIS — C3490 Malignant neoplasm of unspecified part of unspecified bronchus or lung: Secondary | ICD-10-CM | POA: Diagnosis not present

## 2020-07-18 DIAGNOSIS — G9389 Other specified disorders of brain: Secondary | ICD-10-CM | POA: Diagnosis not present

## 2020-07-18 MED ORDER — GADOBUTROL 1 MMOL/ML IV SOLN
7.0000 mL | Freq: Once | INTRAVENOUS | Status: AC | PRN
  Administered 2020-07-18: 7 mL via INTRAVENOUS

## 2020-07-19 NOTE — Progress Notes (Signed)
Thoracic Location of Tumor / Histology: RLL Lung  Patient presented with shortness of breath and chest pain. She was referred to pulmonary medicine due to history of COPD.  MRI Brain 07/18/2020: No evidence of acute abnormality or metastatic disease.  Confluent remote high right frontoparietal infarct, smaller remote cortical infarcts in the right frontal lobe and small lacunar infarct in the right basal ganglia.  PET 6/75/4492: Hypermetabolic right lower lobe disease with mediastinal and hilar adenopathy, her lymph nodes were also hypermetabolic, there did not appear to be any hypermetabolism in the left mainstem bronchus.   Bronchoscopy 07/02/2020  Super D CT Chest 06/27/2020: enlargement of the thick-walled spiculated nodule in the superior segment of the right lower lobe measuring up to 1.6 cm and numerous mediastinal adenopathy including an enlarged AP window node right lower paratracheal lymph node   CT Chest 05/23/2020: Spiculated pulmonary nodule in the superior lateral aspect of the right lower lobe measuring up to 1.5 cm.  Nodularity about the upper wall and left mainstem bronchus of uncertain ideology.  Biopsies of RLL Lung mass    Tobacco/Marijuana/Snuff/ETOH use: Former smoker  Past/Anticipated interventions by cardiothoracic surgery, if any:   Past/Anticipated interventions by medical oncology, if any:  Dr. Julien Nordmann 07/10/2020 -I will also ask the pathology department to send her tissue block for PD-L1 expression. -I discussed with the patient the option of treatment of her condition I recommended for her a course of concurrent chemoradiation with weekly carboplatin for AUC of 2 and paclitaxel 45 Mg/M2 for 6-7 weeks followed by restaging scan and if no evidence for disease progression   Signs/Symptoms Weight changes, if any: Stable Respiratory complaints, if any: No SOB noted. Hemoptysis, if any: She reports increased non-productive cough that is worse since bronchoscopy.  Feels  like throat irritation. Pain issues, if any:  No  SAFETY ISSUES: Prior radiation? No Pacemaker/ICD? No Possible current pregnancy? Hysterectomy Is the patient on methotrexate? No  Current Complaints / other details:

## 2020-07-20 NOTE — Progress Notes (Signed)
Pharmacist Chemotherapy Monitoring - Initial Assessment    Anticipated start date: 07/30/20   The following has been reviewed per standard work regarding the patient's treatment regimen: The patient's diagnosis, treatment plan and drug doses, and organ/hematologic function Lab orders and baseline tests specific to treatment regimen  The treatment plan start date, drug sequencing, and pre-medications Prior authorization status  Patient's documented medication list, including drug-drug interaction screen and prescriptions for anti-emetics and supportive care specific to the treatment regimen The drug concentrations, fluid compatibility, administration routes, and timing of the medications to be used The patient's access for treatment and lifetime cumulative dose history, if applicable  The patient's medication allergies and previous infusion related reactions, if applicable   Changes made to treatment plan:  N/A  Follow up needed:  Pending authorization for treatment  F/u compazine called into pharmacy   Philomena Course, [facility], 07/20/2020  1:51 PM

## 2020-07-24 ENCOUNTER — Ambulatory Visit
Admission: RE | Admit: 2020-07-24 | Discharge: 2020-07-24 | Disposition: A | Discharge status: Home / Self Care | Payer: Medicare Other | Source: Ambulatory Visit | Attending: Radiation Oncology | Admitting: Radiation Oncology

## 2020-07-24 ENCOUNTER — Other Ambulatory Visit: Payer: Self-pay

## 2020-07-24 ENCOUNTER — Encounter: Payer: Self-pay | Admitting: Radiation Oncology

## 2020-07-24 VITALS — BP 120/70 | HR 102 | Temp 97.6°F | Resp 20 | Ht 59.0 in | Wt 150.2 lb

## 2020-07-24 DIAGNOSIS — E46 Unspecified protein-calorie malnutrition: Secondary | ICD-10-CM | POA: Insufficient documentation

## 2020-07-24 DIAGNOSIS — K219 Gastro-esophageal reflux disease without esophagitis: Secondary | ICD-10-CM | POA: Diagnosis not present

## 2020-07-24 DIAGNOSIS — C3431 Malignant neoplasm of lower lobe, right bronchus or lung: Secondary | ICD-10-CM | POA: Diagnosis not present

## 2020-07-24 DIAGNOSIS — Z7982 Long term (current) use of aspirin: Secondary | ICD-10-CM | POA: Diagnosis not present

## 2020-07-24 DIAGNOSIS — R011 Cardiac murmur, unspecified: Secondary | ICD-10-CM | POA: Diagnosis not present

## 2020-07-24 DIAGNOSIS — Z8673 Personal history of transient ischemic attack (TIA), and cerebral infarction without residual deficits: Secondary | ICD-10-CM | POA: Diagnosis not present

## 2020-07-24 DIAGNOSIS — J449 Chronic obstructive pulmonary disease, unspecified: Secondary | ICD-10-CM | POA: Diagnosis not present

## 2020-07-24 DIAGNOSIS — K439 Ventral hernia without obstruction or gangrene: Secondary | ICD-10-CM | POA: Diagnosis not present

## 2020-07-24 DIAGNOSIS — I10 Essential (primary) hypertension: Secondary | ICD-10-CM | POA: Diagnosis not present

## 2020-07-24 DIAGNOSIS — Z87891 Personal history of nicotine dependence: Secondary | ICD-10-CM | POA: Diagnosis not present

## 2020-07-24 DIAGNOSIS — I2510 Atherosclerotic heart disease of native coronary artery without angina pectoris: Secondary | ICD-10-CM | POA: Diagnosis not present

## 2020-07-24 DIAGNOSIS — Z79899 Other long term (current) drug therapy: Secondary | ICD-10-CM | POA: Diagnosis not present

## 2020-07-24 DIAGNOSIS — K2270 Barrett's esophagus without dysplasia: Secondary | ICD-10-CM | POA: Insufficient documentation

## 2020-07-24 DIAGNOSIS — Z510 Encounter for antineoplastic radiation therapy: Secondary | ICD-10-CM | POA: Diagnosis not present

## 2020-07-24 DIAGNOSIS — Z86010 Personal history of colonic polyps: Secondary | ICD-10-CM | POA: Insufficient documentation

## 2020-07-24 DIAGNOSIS — C3491 Malignant neoplasm of unspecified part of right bronchus or lung: Secondary | ICD-10-CM

## 2020-07-25 ENCOUNTER — Other Ambulatory Visit: Payer: Medicare Other

## 2020-07-26 ENCOUNTER — Encounter: Payer: Self-pay | Admitting: Licensed Clinical Social Worker

## 2020-07-26 NOTE — Progress Notes (Signed)
[place] Psychosocial Distress Screening Clinical Social Work  Clinical Social Work was referred by distress screening protocol.  The patient scored a 10 on the Psychosocial Distress Thermometer which indicates severe distress. Clinical Social Worker contacted patient by phone to assess for distress and other psychosocial needs.   Patient is overwhelmed right now as she "wasn't expecting this" and is adjusting to all of the information and appointments.  She has some family support, but it is limited.  CSW informed patient of individual and group support options through [facility]. Pt declined right now, but may utilize in the future.  Patient and husband also recently bought and moved into a condo in April 2022. She is concerned about overall costs of treatment which may impact housing payments.  She is also concerned about cost of gas. Fin adv is planning to see pt tomorrow to discuss [employer]. CSW also informed pt about [employer] fund which can help with some gas costs. No other needs at this time.  ONCBCN DISTRESS SCREENING 07/24/2020  Screening Type Initial Screening  Distress experienced in past week (1-10) 10  Practical problem type Housing;Insurance;Transportation  Family Problem type Partner  Emotional problem type Depression;Nervousness/Anxiety;Adjusting to illness;Feeling hopeless;Adjusting to appearance changes  Spiritual/Religous concerns type Loss of sense of purpose  Information Concerns Type Lack of info about diagnosis;Lack of info about treatment;Lack of info about complementary therapy choices;Lack of info about maintaining fitness  Physical Problem type Sleep/insomnia;Mouth sores/swallowing;Skin dry/itchy  Other Contact via phone    Clinical Social Worker follow up needed: Yes.    If yes, follow up plan: CSW will hand-off to Abigail/Anne to sign up pt for [place], LCSW

## 2020-07-27 ENCOUNTER — Other Ambulatory Visit: Payer: Self-pay

## 2020-07-27 ENCOUNTER — Other Ambulatory Visit: Payer: Self-pay | Admitting: *Deleted

## 2020-07-27 ENCOUNTER — Other Ambulatory Visit: Payer: Self-pay | Admitting: Medical Oncology

## 2020-07-27 ENCOUNTER — Encounter: Payer: Self-pay | Admitting: Internal Medicine

## 2020-07-27 ENCOUNTER — Inpatient Hospital Stay: Payer: Medicare Other | Attending: Internal Medicine

## 2020-07-27 DIAGNOSIS — R059 Cough, unspecified: Secondary | ICD-10-CM | POA: Insufficient documentation

## 2020-07-27 DIAGNOSIS — C3491 Malignant neoplasm of unspecified part of right bronchus or lung: Secondary | ICD-10-CM

## 2020-07-27 DIAGNOSIS — R5383 Other fatigue: Secondary | ICD-10-CM | POA: Insufficient documentation

## 2020-07-27 DIAGNOSIS — Z5111 Encounter for antineoplastic chemotherapy: Secondary | ICD-10-CM | POA: Insufficient documentation

## 2020-07-27 DIAGNOSIS — C3431 Malignant neoplasm of lower lobe, right bronchus or lung: Secondary | ICD-10-CM | POA: Insufficient documentation

## 2020-07-27 MED ORDER — PROCHLORPERAZINE MALEATE 10 MG PO TABS: 10.0000 mg | ORAL_TABLET | Freq: Four times a day (QID) | ORAL | 0 refills | Status: DC | PRN

## 2020-07-27 NOTE — Progress Notes (Signed)
Met with patient at registration to introduce myself as Arboriculturist and to offer available resources.  Discussed one-time $1000 Radio broadcast assistant to assist with personal expenses while going through treatment. Advised what is needed to apply and she may bring Monday 7/11 to meet after tx to complete process.  She has my card for any additional financial questions or concerns.

## 2020-07-27 NOTE — Progress Notes (Signed)
Error

## 2020-07-29 NOTE — Progress Notes (Signed)
Radiation Oncology         [phone] ________________________________  Name: [name] MRN: [id number]  Date: 07/24/2020  DOB: Jul 01, 1940  ZD:[id number], Lora Havens, MD  Curt Bears, MD     REFERRING PHYSICIAN: Curt Bears, MD   DIAGNOSIS: The encounter diagnosis was Primary malignant neoplasm of right lower lobe of lung ([place]).   HISTORY OF PRESENT ILLNESS::[name] is a 80 y.o. female who is seen for an initial consultation visit regarding the patient's diagnosis of lung cancer.  The patient was found to have an abnormality within the right lower lobe corresponding to a 1.5 cm tumor.  MRI scan of the brain was negative.  Bronchoscopy was performed and this confirmed non-small cell lung cancer consistent with squamous cell cancer.  PET scan then was completed which did reveal hypermetabolic activity within the known tumor within the right lower lobe.  Additional hypermetabolic activity was seen within the right hilum, mediastinum, and also some contralateral mediastinal lymphadenopathy as well.  The patient has seen medical oncology and has discussed a potential course of chemoradiation treatment and I have been asked to see the patient for consideration of radiation treatment.    PREVIOUS RADIATION THERAPY: No   PAST MEDICAL HISTORY:  has a past medical history of Acute respiratory failure with hypoxia s/p tracheostomy (07/20/2013), Anemia, Arthritis, Barrett's esophagus, CAD (coronary artery disease), Carotid artery disease ([place]), Chronic back pain, Colonic obstruction due to suspected colitis; s/p colectomy/colostomy, Complication of anesthesia, COPD (chronic obstructive pulmonary disease) ([place]), Depression, Dizziness, DVT of axillary vein, acute right ([place]), GERD (gastroesophageal reflux disease), Heart murmur, History of bronchitis, History of colon polyps, History of hiatal hernia, History of kidney stones, HTN (hypertension), Hypertension, Intracranial  hemorrhage ([place]), Joint pain, Neck pain, Other and unspecified hyperlipidemia, Pneumonia, Protein-calorie malnutrition, severe w/ electrolyte imbalance, SBO (small bowel obstruction) ([place]), and Stroke ([place]) (early 80's).     PAST SURGICAL HISTORY: Past Surgical History:  Procedure Laterality Date   ABDOMINAL HYSTERECTOMY     APPENDECTOMY     BOWEL RESECTION N/A 08/01/2013   Procedure: SMALL BOWEL RESECTION;  Surgeon: Harl Bowie, MD;  Location: [facility];  Service: General;  Laterality: N/A;   BRONCHIAL BIOPSY  07/02/2020   Procedure: BRONCHIAL BIOPSIES;  Surgeon: Collene Gobble, MD;  Location: [facility] ENDOSCOPY;  Service: Pulmonary;;   BRONCHIAL BRUSHINGS  07/02/2020   Procedure: BRONCHIAL BRUSHINGS;  Surgeon: Collene Gobble, MD;  Location: [facility];  Service: Pulmonary;;   BRONCHIAL NEEDLE ASPIRATION BIOPSY  07/02/2020   Procedure: BRONCHIAL NEEDLE ASPIRATION BIOPSIES;  Surgeon: Collene Gobble, MD;  Location: [place];  Service: Pulmonary;;   BRONCHIAL WASHINGS  07/02/2020   Procedure: BRONCHIAL WASHINGS;  Surgeon: Collene Gobble, MD;  Location: [place] ENDOSCOPY;  Service: Pulmonary;;   cataract surgery Bilateral    CHOLECYSTECTOMY     COLON RESECTION     COLON SURGERY     COLOSTOMY N/A 08/01/2013   Procedure: COLOSTOMY;  Surgeon: Harl Bowie, MD;  Location: [facility];  Service: General;  Laterality: N/A;   COLOSTOMY TAKEDOWN  01/24/2014   dr blackman   COLOSTOMY TAKEDOWN N/A 01/24/2014   Procedure: COLOSTOMY TAKEDOWN;  Surgeon: Coralie Keens, MD;  Location: [facility];  Service: General;  Laterality: N/A;   CORONARY ANGIOPLASTY WITH STENT PLACEMENT  12/20/2012   STENT TO OM         DR COOPER   ENDARTERECTOMY Right 08/08/2014   Procedure: RIGHT CAROTID ENDARTERECTOMY WITH HEMASHIELD PATCH ANGIOPLASTY;  Surgeon: Elam Dutch, MD;  Location: [facility];  Service: Vascular;  Laterality: Right;   FIDUCIAL MARKER PLACEMENT  07/02/2020   Procedure: FIDUCIAL MARKER PLACEMENT;  Surgeon: Collene Gobble, MD;  Location: [facility] ENDOSCOPY;  Service: Pulmonary;;   FOOT SURGERY Left    HAND SURGERY Bilateral    for ganglion cysts   HEMOSTASIS CONTROL  07/02/2020   Procedure: HEMOSTASIS CONTROL;  Surgeon: Collene Gobble, MD;  Location: [facility] ENDOSCOPY;  Service: Pulmonary;;  ice saline and epi   LAPAROTOMY N/A 08/01/2013   Procedure: EXPLORATORY LAPAROTOMY;  Surgeon: Harl Bowie, MD;  Location: [place];  Service: General;  Laterality: N/A;   LEFT HEART CATH AND CORONARY ANGIOGRAPHY N/A 01/10/2019   Procedure: LEFT HEART CATH AND CORONARY ANGIOGRAPHY;  Surgeon: Troy Sine, MD;  Location: [place] [facility] CV LAB;  Service: Cardiovascular;  Laterality: N/A;   LEFT HEART CATHETERIZATION WITH CORONARY ANGIOGRAM N/A 12/20/2012   Procedure: LEFT HEART CATHETERIZATION WITH CORONARY ANGIOGRAM;  Surgeon: Blane Ohara, MD;  Location: [facility] CATH LAB;  Service: Cardiovascular;  Laterality: N/A;   PARTIAL COLECTOMY N/A 08/01/2013   Procedure: PARTIAL COLECTOMY;  Surgeon: Harl Bowie, MD;  Location: [place];  Service: General;  Laterality: N/A;   ROTATOR CUFF REPAIR Left    TONSILLECTOMY     TRACHEOSTOMY     feinstein   VIDEO BRONCHOSCOPY WITH ENDOBRONCHIAL NAVIGATION N/A 07/02/2020   Procedure: VIDEO BRONCHOSCOPY WITH ENDOBRONCHIAL NAVIGATION;  Surgeon: Collene Gobble, MD;  Location: [facility] ENDOSCOPY;  Service: Pulmonary;  Laterality: N/A;     FAMILY HISTORY: family history includes AAA (abdominal aortic aneurysm) in her father; Heart attack in her father; Heart disease in her father and another family member; Varicose Veins in her mother.   SOCIAL HISTORY:  reports that she has quit smoking. Her smoking use included cigarettes. She has a 13.25 pack-year smoking history. She has never used smokeless tobacco. She reports that she does not drink alcohol and does not use drugs.   ALLERGIES: Pregabalin and Sulfonamide derivatives   MEDICATIONS:  Current Outpatient Medications  Medication Sig  Dispense Refill   aspirin EC 81 MG tablet Take 2 tablets (162 mg total) by mouth in the morning. Okay to restart this medication on 07/04/2020 30 tablet 11   atorvastatin (LIPITOR) 80 MG tablet Take 80 mg by mouth every evening.      buPROPion (WELLBUTRIN XL) 150 MG 24 hr tablet Take 1 tablet (150 mg total) by mouth daily. 90 tablet 3   CALCIUM PO Take 1 tablet by mouth in the morning.     Cholecalciferol (VITAMIN D3 PO) Take 1 tablet by mouth in the morning.     fish oil-omega-3 fatty acids 1000 MG capsule Take 1,000 mg by mouth in the morning.     fluticasone (FLONASE) 50 MCG/ACT nasal spray Place 1 spray into both nostrils daily as needed for allergies.     folic acid (FOLVITE) 725 MCG tablet Take 800 mcg by mouth in the morning.     metoprolol succinate (TOPROL-XL) 50 MG 24 hr tablet TAKE 1 TABLET BY MOUTH  DAILY 90 tablet 3   mirtazapine (REMERON) 45 MG tablet Take 45 mg by mouth at bedtime.      nitroGLYCERIN (NITROSTAT) 0.4 MG SL tablet DISSOLVE 1 TABLET UNDER THE TONGUE EVERY 5 MINUTES AS  NEEDED FOR CHEST PAIN. MAX  OF 3 TABLETS IN 15 MINUTES. CALL 911 IF PAIN PERSISTS. 25 tablet 3   ondansetron (ZOFRAN) 4  MG tablet Take 4 mg by mouth every 8 (eight) hours as needed for nausea or vomiting.      pantoprazole (PROTONIX) 40 MG tablet Take 40 mg by mouth in the morning.     sertraline (ZOLOFT) 100 MG tablet Take 200 mg by mouth in the morning. 100 mg + 100 mg= 200 mg daily     sertraline (ZOLOFT) 50 MG tablet Take 50 mg by mouth in the morning. 50 mg + 100 mg=150 mg daily     prochlorperazine (COMPAZINE) 10 MG tablet Take 1 tablet (10 mg total) by mouth every 6 (six) hours as needed for nausea or vomiting. 30 tablet 0   No current facility-administered medications for this encounter.     REVIEW OF SYSTEMS:  A 15 point review of systems is documented in the electronic medical record. This was obtained by the nursing staff. However, I reviewed this with the patient to discuss relevant  findings and make appropriate changes.  Pertinent items are noted in HPI.    PHYSICAL EXAM:  height is 4\' 11"  (1.499 m) and weight is 150 lb 4 oz (68.2 kg). Her temperature is 97.6 F (36.4 C). Her blood pressure is 120/70 and her pulse is 102 (abnormal). Her respiration is 20 and oxygen saturation is 99%.   ECOG = 1  0 - Asymptomatic (Fully active, able to carry on all predisease activities without restriction)  1 - Symptomatic but completely ambulatory (Restricted in physically strenuous activity but ambulatory and able to carry out work of a light or sedentary nature. For example, light housework, office work)  2 - Symptomatic, <50% in bed during the day (Ambulatory and capable of all self care but unable to carry out any work activities. Up and about more than 50% of waking hours)  3 - Symptomatic, >50% in bed, but not bedbound (Capable of only limited self-care, confined to bed or chair 50% or more of waking hours)  4 - Bedbound (Completely disabled. Cannot carry on any self-care. Totally confined to bed or chair)  5 - Death   Eustace Pen MM, Creech RH, Tormey DC, et al. [id number]). "Toxicity and response criteria of the [facility]". [place] Oncol. 5 (6): 649-55  Alert, no distress   LABORATORY DATA:  Lab Results  Component Value Date   WBC 8.0 07/10/2020   HGB 10.5 (L) 07/10/2020   HCT 33.8 (L) 07/10/2020   MCV 87.1 07/10/2020   PLT 231 07/10/2020   Lab Results  Component Value Date   NA 141 07/10/2020   K 4.3 07/10/2020   CL 114 (H) 07/10/2020   CO2 21 (L) 07/10/2020   Lab Results  Component Value Date   ALT 18 07/10/2020   AST 20 07/10/2020   ALKPHOS 92 07/10/2020   BILITOT 0.6 07/10/2020      RADIOGRAPHY: MR Brain W Wo Contrast  Result Date: 07/19/2020 CLINICAL DATA:  Non-small cell lung cancer, pretreatment staging. EXAM: MRI HEAD WITHOUT AND WITH CONTRAST TECHNIQUE: Multiplanar, multiecho pulse sequences of the brain and surrounding  structures were obtained without and with intravenous contrast. CONTRAST:  72mL GADAVIST GADOBUTROL 1 MMOL/ML IV SOLN COMPARISON:  CT head 01/23/2015. MRI 08/09/2014. FINDINGS: Brain: No acute infarction, hemorrhage, hydrocephalus, extra-axial collection or mass lesion. Confluent remote high right frontoparietal infarct with encephalomalacia and surrounding gliosis. Small remote lacunar infarct in the right basal ganglia. Additional smaller remote cortical infarcts in the right frontal lobe. Small nonenhancing focus of susceptibility artifact in the left  frontal lobe appears similar to prior and most likely represents the sequela of chronic microhemorrhage. Additional scattered mild T2/FLAIR hyperintensities within the white matter and pons, most likely related to chronic microvascular ischemic disease. No abnormal enhancement to suggest intraparenchymal metastatic disease. Vascular: Major arterial flow voids are maintained skull base. Small left cerebellar developmental venous anomaly. Skull and upper cervical spine: Normal marrow signal. Sinuses/Orbits: Inferior left maxillary sinus mucosal thickening with possible retention cyst. Mild ethmoid air cell mucosal thickening. Unremarkable orbits. Other: No sizable mastoid effusions. IMPRESSION: 1. No evidence of acute abnormality or metastatic disease. 2. Confluent remote high right frontoparietal infarct, smaller remote cortical infarcts in the right frontal lobe and small lacunar infarct in the right basal ganglia. Electronically Signed   By: Margaretha Sheffield MD   On: 07/19/2020 08:45   NM PET Image Initial (PI) Skull Base To Thigh  Result Date: 07/05/2020 CLINICAL DATA:  Initial treatment strategy for right lower lobe lung lesion. EXAM: NUCLEAR MEDICINE PET SKULL BASE TO THIGH TECHNIQUE: 7.2 mCi F-18 FDG was injected intravenously. Full-ring PET imaging was performed from the skull base to thigh after the radiotracer. CT data was obtained and used for  attenuation correction and anatomic localization. Fasting blood glucose: 116 mg/dl COMPARISON:  Chest CT 06/27/2020 FINDINGS: Mediastinal blood pool activity: SUV max 2.03 Liver activity: SUV max NA NECK: No hypermetabolic lymph nodes in the neck. Incidental CT findings: Significant muscular uptake is noted in the neck and upper chest area which could be due to the patient taking insulin, eating recently or possibly related to exercise. CHEST: The 2.4 cm right lower lobe partially cavitary mass is hypermetabolic with SUV max of 8.67. There are also ipsilateral contralateral hypermetabolic mediastinal nodes. The subcarinal node has an SUV max 4.76. Right hilar node has an SUV max of 3.35. Pretracheal has an SUV max of 5.22. Prevascular node has an SUV max of 5.47. No other hypermetabolic pulmonary lesions. Incidental CT findings: Stable cardiac enlargement and aortic and coronary artery calcifications. Stable significant underlying lung disease/emphysema. ABDOMEN/PELVIS: No PET-CT findings suspicious for hepatic or adrenal gland metastasis. No abdominal or pelvic hypermetabolic lymphadenopathy. Incidental CT findings: Advanced aortic and branch vessel calcifications but no aneurysm. The gallbladder is surgically absent. Anterior abdominal wall hernia contains small bowel and colon but no obstructive findings. Cystic area in the left inguinal area could be postoperative change. No hypermetabolism. SKELETON: Significant diffuse muscular uptake but no worrisome muscle lesions. No findings suspicious for osseous metastatic disease. Incidental CT findings: none IMPRESSION: 1. Hypermetabolic right lower lobe lung lesion consistent with primary lung neoplasm. 2. Mediastinal and hilar lymphadenopathy as detailed above. 3. No findings to suggest abdominal/pelvic metastatic disease or osseous metastatic disease. Electronically Signed   By: Marijo Sanes M.D.   On: 07/05/2020 16:52   DG Chest Port 1 View  Result Date:  07/02/2020 CLINICAL DATA:  Status post bronchoscopy EXAM: PORTABLE CHEST 1 VIEW COMPARISON:  06/27/20 FINDINGS: Cardiac shadow is enlarged but stable. Aortic calcifications are noted. Increased density is noted in the right lung base consistent with parenchymal hemorrhage related to the recent bronchoscopy and biopsy. Fiducial markers are noted on the right thorax is seen. IMPRESSION: No evidence of post bronchoscopy pneumothorax. Increased density in the right base is noted related parenchymal hemorrhage from the recent bronchoscopy. Electronically Signed   By: Inez Catalina M.D.   On: 07/02/2020 11:45   DG C-ARM BRONCHOSCOPY  Result Date: 07/02/2020 C-ARM BRONCHOSCOPY: Fluoroscopy was utilized by the requesting physician.  No  radiographic interpretation.       IMPRESSION/ PLAN:  The patient has a new diagnosis of stage III non-small cell lung cancer with involvement of the right lower lobe, right hilum, and mediastinum.  I believe that the patient is a good candidate for chemoradiation treatment and this was discussed with the patient.  I anticipate a 6-1/2-week course of treatment.  We discussed the rationale of such a treatment in conjunction with concurrent chemotherapy.  We discussed the potential benefit as well as possible side effects and risks.  All of the patient's questions were answered and she does wish to proceed with such a treatment.  She will undergo simulation this week for treatment planning and we anticipate beginning the patient next Monday on 07/30/2020.  The patient was seen in person today in clinic.  The total time spent on the patient's visit today was 50 minutes, including chart review, direct discussion/evaluation with the patient, and coordination of care.        ________________________________   Jodelle Gross, MD, PhD   **Disclaimer: This note was dictated with voice recognition software. Similar sounding words can inadvertently be transcribed and this note may  contain transcription errors which may not have been corrected upon publication of note.**

## 2020-07-30 ENCOUNTER — Inpatient Hospital Stay: Payer: Medicare Other

## 2020-07-30 ENCOUNTER — Ambulatory Visit: Payer: Medicare Other | Admitting: Radiation Oncology

## 2020-07-30 ENCOUNTER — Encounter: Payer: Self-pay | Admitting: *Deleted

## 2020-07-30 ENCOUNTER — Other Ambulatory Visit: Payer: Self-pay

## 2020-07-30 ENCOUNTER — Inpatient Hospital Stay (HOSPITAL_BASED_OUTPATIENT_CLINIC_OR_DEPARTMENT_OTHER): Payer: Medicare Other | Admitting: Internal Medicine

## 2020-07-30 ENCOUNTER — Encounter: Payer: Self-pay | Admitting: Internal Medicine

## 2020-07-30 VITALS — BP 171/84 | HR 81 | Temp 98.7°F | Resp 18

## 2020-07-30 VITALS — BP 147/64 | HR 86 | Temp 98.3°F | Resp 18 | Ht 59.0 in | Wt 151.8 lb

## 2020-07-30 DIAGNOSIS — C3491 Malignant neoplasm of unspecified part of right bronchus or lung: Secondary | ICD-10-CM

## 2020-07-30 DIAGNOSIS — Z5111 Encounter for antineoplastic chemotherapy: Secondary | ICD-10-CM

## 2020-07-30 DIAGNOSIS — R5383 Other fatigue: Secondary | ICD-10-CM | POA: Diagnosis not present

## 2020-07-30 DIAGNOSIS — C3431 Malignant neoplasm of lower lobe, right bronchus or lung: Secondary | ICD-10-CM | POA: Diagnosis not present

## 2020-07-30 DIAGNOSIS — R059 Cough, unspecified: Secondary | ICD-10-CM | POA: Diagnosis not present

## 2020-07-30 DIAGNOSIS — I10 Essential (primary) hypertension: Secondary | ICD-10-CM | POA: Diagnosis not present

## 2020-07-30 LAB — CBC WITH DIFFERENTIAL (CANCER CENTER ONLY)
Abs Immature Granulocytes: 0.02 10*3/uL (ref 0.00–0.07)
Basophils Absolute: 0 10*3/uL (ref 0.0–0.1)
Basophils Relative: 1 %
Eosinophils Absolute: 0.1 10*3/uL (ref 0.0–0.5)
Eosinophils Relative: 2 %
HCT: 32.6 % — ABNORMAL LOW (ref 36.0–46.0)
Hemoglobin: 10.2 g/dL — ABNORMAL LOW (ref 12.0–15.0)
Immature Granulocytes: 0 %
Lymphocytes Relative: 33 %
Lymphs Abs: 2.4 10*3/uL (ref 0.7–4.0)
MCH: 27.6 pg (ref 26.0–34.0)
MCHC: 31.3 g/dL (ref 30.0–36.0)
MCV: 88.1 fL (ref 80.0–100.0)
Monocytes Absolute: 0.7 10*3/uL (ref 0.1–1.0)
Monocytes Relative: 10 %
Neutro Abs: 4 10*3/uL (ref 1.7–7.7)
Neutrophils Relative %: 54 %
Platelet Count: 201 10*3/uL (ref 150–400)
RBC: 3.7 MIL/uL — ABNORMAL LOW (ref 3.87–5.11)
RDW: 15.3 % (ref 11.5–15.5)
WBC Count: 7.2 10*3/uL (ref 4.0–10.5)
nRBC: 0 % (ref 0.0–0.2)

## 2020-07-30 LAB — CMP (CANCER CENTER ONLY)
ALT: 11 U/L (ref 0–44)
AST: 14 U/L — ABNORMAL LOW (ref 15–41)
Albumin: 3.5 g/dL (ref 3.5–5.0)
Alkaline Phosphatase: 87 U/L (ref 38–126)
Anion gap: 10 (ref 5–15)
BUN: 19 mg/dL (ref 8–23)
CO2: 23 mmol/L (ref 22–32)
Calcium: 9.9 mg/dL (ref 8.9–10.3)
Chloride: 113 mmol/L — ABNORMAL HIGH (ref 98–111)
Creatinine: 0.83 mg/dL (ref 0.44–1.00)
GFR, Estimated: >60 mL/min (ref >60)
Glucose, Bld: 89 mg/dL (ref 70–99)
Potassium: 3.9 mmol/L (ref 3.5–5.1)
Sodium: 146 mmol/L — ABNORMAL HIGH (ref 135–145)
Total Bilirubin: 0.4 mg/dL (ref 0.3–1.2)
Total Protein: 6.6 g/dL (ref 6.5–8.1)

## 2020-07-30 MED ORDER — SODIUM CHLORIDE 0.9 % IV SOLN
145.0000 mg | Freq: Once | INTRAVENOUS | Status: AC
  Administered 2020-07-30: 150 mg via INTRAVENOUS
  Filled 2020-07-30: qty 15

## 2020-07-30 MED ORDER — SODIUM CHLORIDE 0.9 % IV SOLN
45.0000 mg/m2 | Freq: Once | INTRAVENOUS | Status: AC
  Administered 2020-07-30: 78 mg via INTRAVENOUS
  Filled 2020-07-30: qty 13

## 2020-07-30 MED ORDER — DIPHENHYDRAMINE HCL 50 MG/ML IJ SOLN
INTRAMUSCULAR | Status: AC
  Filled 2020-07-30: qty 1

## 2020-07-30 MED ORDER — DEXAMETHASONE SODIUM PHOSPHATE 100 MG/10ML IJ SOLN
20.0000 mg | Freq: Once | INTRAMUSCULAR | Status: AC
  Administered 2020-07-30: 20 mg via INTRAVENOUS
  Filled 2020-07-30: qty 20

## 2020-07-30 MED ORDER — FAMOTIDINE 20 MG IN NS 100 ML IVPB
INTRAVENOUS | Status: AC
  Filled 2020-07-30: qty 100

## 2020-07-30 MED ORDER — SODIUM CHLORIDE 0.9 % IV SOLN
Freq: Once | INTRAVENOUS | Status: AC
  Filled 2020-07-30: qty 250

## 2020-07-30 MED ORDER — PALONOSETRON HCL INJECTION 0.25 MG/5ML
INTRAVENOUS | Status: AC
  Filled 2020-07-30: qty 5

## 2020-07-30 MED ORDER — FAMOTIDINE 20 MG IN NS 100 ML IVPB
20.0000 mg | Freq: Once | INTRAVENOUS | Status: AC
  Administered 2020-07-30: 20 mg via INTRAVENOUS

## 2020-07-30 MED ORDER — PALONOSETRON HCL INJECTION 0.25 MG/5ML
0.2500 mg | Freq: Once | INTRAVENOUS | Status: AC
  Administered 2020-07-30: 0.25 mg via INTRAVENOUS

## 2020-07-30 MED ORDER — DIPHENHYDRAMINE HCL 50 MG/ML IJ SOLN
50.0000 mg | Freq: Once | INTRAMUSCULAR | Status: AC
  Administered 2020-07-30: 50 mg via INTRAVENOUS

## 2020-07-30 NOTE — Patient Instructions (Signed)
[facility]  Discharge Instructions: Thank you for choosing [facility] to provide your oncology and hematology care.   If you have a lab appointment with the [facility], please go directly to the [facility] and check in at the registration area.   Wear comfortable clothing and clothing appropriate for easy access to any Portacath or PICC line.   We strive to give you quality time with your provider. You may need to reschedule your appointment if you arrive late (15 or more minutes).  Arriving late affects you and other patients whose appointments are after yours.  Also, if you miss three or more appointments without notifying the office, you may be dismissed from the clinic at the provider's discretion.      For prescription refill requests, have your pharmacy contact our office and allow 72 hours for refills to be completed.    Today you received the following chemotherapy and/or immunotherapy agents taxol/carboplatin      To help prevent nausea and vomiting after your treatment, we encourage you to take your nausea medication as directed.  BELOW ARE SYMPTOMS THAT SHOULD BE REPORTED IMMEDIATELY: *FEVER GREATER THAN 100.4 F (38 C) OR HIGHER *CHILLS OR SWEATING *NAUSEA AND VOMITING THAT IS NOT CONTROLLED WITH YOUR NAUSEA MEDICATION *UNUSUAL SHORTNESS OF BREATH *UNUSUAL BRUISING OR BLEEDING *URINARY PROBLEMS (pain or burning when urinating, or frequent urination) *BOWEL PROBLEMS (unusual diarrhea, constipation, pain near the anus) TENDERNESS IN MOUTH AND THROAT WITH OR WITHOUT PRESENCE OF ULCERS (sore throat, sores in mouth, or a toothache) UNUSUAL RASH, SWELLING OR PAIN  UNUSUAL VAGINAL DISCHARGE OR ITCHING   Items with * indicate a potential emergency and should be followed up as soon as possible or go to the Emergency Department if any problems should occur.  Please show the CHEMOTHERAPY ALERT CARD or IMMUNOTHERAPY ALERT CARD at  check-in to the Emergency Department and triage nurse.  Should you have questions after your visit or need to cancel or reschedule your appointment, please contact [facility]  Dept: [phone]  and follow the prompts.  Office hours are 8:00 a.m. to 4:30 p.m. Monday - Friday. Please note that voicemails left after 4:00 p.m. may not be returned until the following business day.  We are closed weekends and major holidays. You have access to a nurse at all times for urgent questions. Please call the main number to the clinic Dept: [phone] and follow the prompts.   For any non-urgent questions, you may also contact your provider using MyChart. We now offer e-Visits for anyone 39 and older to request care online for non-urgent symptoms. For details visit [email or web address].   Also download the MyChart app! Go to the app store, search "MyChart", open the app, select [facility], and log in with your MyChart username and password.  Due to Covid, a mask is required upon entering the hospital/clinic. If you do not have a mask, one will be given to you upon arrival. For doctor visits, patients may have 1 support person aged 4 or older with them. For treatment visits, patients cannot have anyone with them due to current Covid guidelines and our immunocompromised population.   Paclitaxel injection What is this medication? PACLITAXEL (PAK li TAX el) is a chemotherapy drug. It targets fast dividing cells, like cancer cells, and causes these cells to die. This medicine is used to treat ovarian cancer, breast cancer, lung cancer, Kaposi's sarcoma, andother cancers. This medicine may be  used for other purposes; ask your health care provider orpharmacist if you have questions. COMMON BRAND NAME(S): Onxol, Taxol What should I tell my care team before I take this medication? They need to know if you have any of these conditions: history of irregular heartbeat liver  disease low blood counts, like low white cell, platelet, or red cell counts lung or breathing disease, like asthma tingling of the fingers or toes, or other nerve disorder an unusual or allergic reaction to paclitaxel, alcohol, polyoxyethylated castor oil, other chemotherapy, other medicines, foods, dyes, or preservatives pregnant or trying to get pregnant breast-feeding How should I use this medication? This drug is given as an infusion into a vein. It is administered in a hospitalor clinic by a specially trained health care professional. Talk to your pediatrician regarding the use of this medicine in children.Special care may be needed. Overdosage: If you think you have taken too much of this medicine contact apoison control center or emergency room at once. NOTE: This medicine is only for you. Do not share this medicine with others. What if I miss a dose? It is important not to miss your dose. Call your doctor or health careprofessional if you are unable to keep an appointment. What may interact with this medication? Do not take this medicine with any of the following medications: live virus vaccines This medicine may also interact with the following medications: antiviral medicines for hepatitis, HIV or AIDS certain antibiotics like erythromycin and clarithromycin certain medicines for fungal infections like ketoconazole and itraconazole certain medicines for seizures like carbamazepine, phenobarbital, phenytoin gemfibrozil nefazodone rifampin St. John's wort This list may not describe all possible interactions. Give your health care provider a list of all the medicines, herbs, non-prescription drugs, or dietary supplements you use. Also tell them if you smoke, drink alcohol, or use illegaldrugs. Some items may interact with your medicine. What should I watch for while using this medication? Your condition will be monitored carefully while you are receiving this medicine. You will  need important blood work done while you are taking thismedicine. This medicine can cause serious allergic reactions. To reduce your risk you will need to take other medicine(s) before treatment with this medicine. If you experience allergic reactions like skin rash, itching or hives, swelling of theface, lips, or tongue, tell your doctor or health care professional right away. In some cases, you may be given additional medicines to help with side effects.Follow all directions for their use. This drug may make you feel generally unwell. This is not uncommon, as chemotherapy can affect healthy cells as well as cancer cells. Report any side effects. Continue your course of treatment even though you feel ill unless yourdoctor tells you to stop. Call your doctor or health care professional for advice if you get a fever, chills or sore throat, or other symptoms of a cold or flu. Do not treat yourself. This drug decreases your body's ability to fight infections. Try toavoid being around people who are sick. This medicine may increase your risk to bruise or bleed. Call your doctor orhealth care professional if you notice any unusual bleeding. Be careful brushing and flossing your teeth or using a toothpick because you may get an infection or bleed more easily. If you have any dental work done,tell your dentist you are receiving this medicine. Avoid taking products that contain aspirin, acetaminophen, ibuprofen, naproxen, or ketoprofen unless instructed by your doctor. These medicines may hide afever. Do not become pregnant while taking this medicine.  Women should inform their doctor if they wish to become pregnant or think they might be pregnant. There is a potential for serious side effects to an unborn child. Talk to your health care professional or pharmacist for more information. Do not breast-feed aninfant while taking this medicine. Men are advised not to father a child while receiving this medicine. This  product may contain alcohol. Ask your pharmacist or healthcare provider if this medicine contains alcohol. Be sure to tell all healthcare providers you are taking this medicine. Certain medicines, like metronidazole and disulfiram, can cause an unpleasant reaction when taken with alcohol. The reaction includes flushing, headache, nausea, vomiting, sweating, and increased thirst. Thereaction can last from 30 minutes to several hours. What side effects may I notice from receiving this medication? Side effects that you should report to your doctor or health care professionalas soon as possible: allergic reactions like skin rash, itching or hives, swelling of the face, lips, or tongue breathing problems changes in vision fast, irregular heartbeat high or low blood pressure mouth sores pain, tingling, numbness in the hands or feet signs of decreased platelets or bleeding - bruising, pinpoint red spots on the skin, black, tarry stools, blood in the urine signs of decreased red blood cells - unusually weak or tired, feeling faint or lightheaded, falls signs of infection - fever or chills, cough, sore throat, pain or difficulty passing urine signs and symptoms of liver injury like dark yellow or brown urine; general ill feeling or flu-like symptoms; light-colored stools; loss of appetite; nausea; right upper belly pain; unusually weak or tired; yellowing of the eyes or skin swelling of the ankles, feet, hands unusually slow heartbeat Side effects that usually do not require medical attention (report to yourdoctor or health care professional if they continue or are bothersome): diarrhea hair loss loss of appetite muscle or joint pain nausea, vomiting pain, redness, or irritation at site where injected tiredness This list may not describe all possible side effects. Call your doctor for medical advice about side effects. You may report side effects to FDA at1-800-FDA-1088. Where should I keep my  medication? This drug is given in a hospital or clinic and will not be stored at home. NOTE: This sheet is a summary. It may not cover all possible information. If you have questions about this medicine, talk to your doctor, pharmacist, orhealth care provider.  2022 Elsevier/Gold Standard (2018-12-08 13:37:23)  Carboplatin injection What is this medication? CARBOPLATIN (KAR boe pla tin) is a chemotherapy drug. It targets fast dividing cells, like cancer cells, and causes these cells to die. This medicine is usedto treat ovarian cancer and many other cancers. This medicine may be used for other purposes; ask your health care provider orpharmacist if you have questions. COMMON BRAND NAME(S): Paraplatin What should I tell my care team before I take this medication? They need to know if you have any of these conditions: blood disorders hearing problems kidney disease recent or ongoing radiation therapy an unusual or allergic reaction to carboplatin, cisplatin, other chemotherapy, other medicines, foods, dyes, or preservatives pregnant or trying to get pregnant breast-feeding How should I use this medication? This drug is usually given as an infusion into a vein. It is administered in [facility] or clinic by a specially trained health care professional. Talk to your pediatrician regarding the use of this medicine in children.Special care may be needed. Overdosage: If you think you have taken too much of this medicine contact apoison control center or emergency room at  once. NOTE: This medicine is only for you. Do not share this medicine with others. What if I miss a dose? It is important not to miss a dose. Call your doctor or health careprofessional if you are unable to keep an appointment. What may interact with this medication? medicines for seizures medicines to increase blood counts like filgrastim, pegfilgrastim, sargramostim some antibiotics like amikacin, gentamicin, neomycin,  streptomycin, tobramycin vaccines Talk to your doctor or health care professional before taking any of thesemedicines: acetaminophen aspirin ibuprofen ketoprofen naproxen This list may not describe all possible interactions. Give your health care provider a list of all the medicines, herbs, non-prescription drugs, or dietary supplements you use. Also tell them if you smoke, drink alcohol, or use illegaldrugs. Some items may interact with your medicine. What should I watch for while using this medication? Your condition will be monitored carefully while you are receiving this medicine. You will need important blood work done while you are taking thismedicine. This drug may make you feel generally unwell. This is not uncommon, as chemotherapy can affect healthy cells as well as cancer cells. Report any side effects. Continue your course of treatment even though you feel ill unless yourdoctor tells you to stop. In some cases, you may be given additional medicines to help with side effects.Follow all directions for their use. Call your doctor or health care professional for advice if you get a fever, chills or sore throat, or other symptoms of a cold or flu. Do not treat yourself. This drug decreases your body's ability to fight infections. Try toavoid being around people who are sick. This medicine may increase your risk to bruise or bleed. Call your doctor orhealth care professional if you notice any unusual bleeding. Be careful brushing and flossing your teeth or using a toothpick because you may get an infection or bleed more easily. If you have any dental work done,tell your dentist you are receiving this medicine. Avoid taking products that contain aspirin, acetaminophen, ibuprofen, naproxen, or ketoprofen unless instructed by your doctor. These medicines may hide afever. Do not become pregnant while taking this medicine. Women should inform their doctor if they wish to become pregnant or think  they might be pregnant. There is a potential for serious side effects to an unborn child. Talk to your health care professional or pharmacist for more information. Do not breast-feed aninfant while taking this medicine. What side effects may I notice from receiving this medication? Side effects that you should report to your doctor or health care professionalas soon as possible: allergic reactions like skin rash, itching or hives, swelling of the face, lips, or tongue signs of infection - fever or chills, cough, sore throat, pain or difficulty passing urine signs of decreased platelets or bleeding - bruising, pinpoint red spots on the skin, black, tarry stools, nosebleeds signs of decreased red blood cells - unusually weak or tired, fainting spells, lightheadedness breathing problems changes in hearing changes in vision chest pain high blood pressure low blood counts - This drug may decrease the number of white blood cells, red blood cells and platelets. You may be at increased risk for infections and bleeding. nausea and vomiting pain, swelling, redness or irritation at the injection site pain, tingling, numbness in the hands or feet problems with balance, talking, walking trouble passing urine or change in the amount of urine Side effects that usually do not require medical attention (report to yourdoctor or health care professional if they continue or are bothersome): hair loss  loss of appetite metallic taste in the mouth or changes in taste This list may not describe all possible side effects. Call your doctor for medical advice about side effects. You may report side effects to FDA at1-800-FDA-1088. Where should I keep my medication? This drug is given in a hospital or clinic and will not be stored at home. NOTE: This sheet is a summary. It may not cover all possible information. If you have questions about this medicine, talk to your doctor, pharmacist, orhealth care provider.  2022  Elsevier/Gold Standard (2007-04-13 14:38:05)

## 2020-07-30 NOTE — Progress Notes (Signed)
[place] [phone]   [phone]  OFFICE PROGRESS NOTE  [name], MD [place] [address]  DIAGNOSIS: stage IIIB (T1b, N3, M0) non-small cell lung cancer, squamous cell carcinoma presented with right lower lobe lung nodule in addition to bilateral mediastinal and right hilar lymphadenopathy diagnosed in June 2022.  PDL TPS 30%.  PRIOR THERAPY: None.  CURRENT THERAPY: A course of concurrent chemoradiation with weekly carboplatin for AUC of 2 and paclitaxel 45 Mg/M2.  First cycle July 30, 2020.  INTERVAL HISTORY: [name] 80 y.o. female returns to the clinic today for follow-up visit.  The patient is feeling fine today with no concerning complaints except for tickling in her throat with some dry cough.  She denied having any current chest pain, shortness of breath except with exertion with no hemoptysis.  She denied having any fever or chills.  She has no nausea, vomiting, diarrhea or constipation.  She has no headache or visual changes.  She has no recent weight loss or night sweats.  The patient is here today for evaluation before starting the first dose of her treatment.  She is expected to start radiotherapy on August 01, 2020.  MEDICAL HISTORY: Past Medical History:  Diagnosis Date   Acute respiratory failure with hypoxia s/p tracheostomy 07/20/2013   Anemia    a. 7-08/2013 felt due to recent critical illness/chronic disease.   Arthritis    handds, knees & back    Barrett's esophagus    CAD (coronary artery disease)    a. Mod dz 2010 initially mgd medically. b. 12/2012 - angina s/p PTCA/DES to mid-circumflex, PTCA/DES to first OM.    Carotid artery disease ([place])    a. 60-70% bilat ICA stenosis by dopplers 05/2012.   Chronic back pain    deteriorating;DDD   Colonic obstruction due to suspected colitis; s/p colectomy/colostomy    a. See SBO.   Complication of anesthesia    hard to wake  up from anesthesia    COPD (chronic obstructive pulmonary disease) (HCC)    Depression    takes Remeron and Zoloft daily   Dizziness    was on Bentyl which caused this-took off of it and no problems since   DVT of axillary vein, acute right ([place])    a. Dx 07/2013 felt due to R IJ central line that had been inserted 7/14. Not on anticoag due to GIB issues and small cerebral bleed.   GERD (gastroesophageal reflux disease)    Heart murmur    History of bronchitis    > 9yr ago    History of colon polyps    History of hiatal hernia    History of kidney stones    was told it was stable but doesn't know for sure that she ever passed it   HTN (hypertension)    Hypertension    takes Metoprolol daily   Intracranial hemorrhage ([place]    a. 08/11/13 - per DC summary, tiny SAH vs SDH done for altered mentation, anticoag stopped including aspirin.   Joint pain    Neck pain    DDD   Other and unspecified hyperlipidemia    takes Lipitor daily   Pneumonia    hx of->15 yrs ago   Protein-calorie malnutrition, severe w/ electrolyte imbalance    a. Severe hypoalbuminemia leading to 3rd spacing including anasarca and pulm edema 07/2013.   SBO (small bowel obstruction) ([place]  a. Lysis of adhesions and ovarian cystectomy 04/2013 for sBO. b. Admitted 07/2013 for colonic obstruction due to suspected colitis, s/p partial colectomy/colostomy 08/01/13. Admission complicated by anasarca, acute resp failure requiring tracheostomy, decannulated 08/25/13. c. Recurrent SBO8/2015, NGT placed for decompression.    Stroke [facility]) early 80's   right sided weakness--TIA    ALLERGIES:  is allergic to pregabalin and sulfonamide derivatives.  MEDICATIONS:  Current Outpatient Medications  Medication Sig Dispense Refill   aspirin EC 81 MG tablet Take 2 tablets (162 mg total) by mouth in the morning. Okay to restart this medication on 07/04/2020 30 tablet 11   atorvastatin (LIPITOR) 80 MG tablet Take 80 mg by mouth every  evening.      buPROPion (WELLBUTRIN XL) 150 MG 24 hr tablet Take 1 tablet (150 mg total) by mouth daily. 90 tablet 3   CALCIUM PO Take 1 tablet by mouth in the morning.     Cholecalciferol (VITAMIN D3 PO) Take 1 tablet by mouth in the morning.     fish oil-omega-3 fatty acids 1000 MG capsule Take 1,000 mg by mouth in the morning.     fluticasone (FLONASE) 50 MCG/ACT nasal spray Place 1 spray into both nostrils daily as needed for allergies.     folic acid (FOLVITE) 800 MCG tablet Take 800 mcg by mouth in the morning.     metoprolol succinate (TOPROL-XL) 50 MG 24 hr tablet TAKE 1 TABLET BY MOUTH  DAILY 90 tablet 3   mirtazapine (REMERON) 45 MG tablet Take 45 mg by mouth at bedtime.      nitroGLYCERIN (NITROSTAT) 0.4 MG SL tablet DISSOLVE 1 TABLET UNDER THE TONGUE EVERY 5 MINUTES AS  NEEDED FOR CHEST PAIN. MAX  OF 3 TABLETS IN 15 MINUTES. CALL 911 IF PAIN PERSISTS. 25 tablet 3   ondansetron (ZOFRAN) 4 MG tablet Take 4 mg by mouth every 8 (eight) hours as needed for nausea or vomiting.      pantoprazole (PROTONIX) 40 MG tablet Take 40 mg by mouth in the morning.     prochlorperazine (COMPAZINE) 10 MG tablet Take 1 tablet (10 mg total) by mouth every 6 (six) hours as needed for nausea or vomiting. 30 tablet 0   sertraline (ZOLOFT) 100 MG tablet Take 200 mg by mouth in the morning. 100 mg + 100 mg= 200 mg daily     sertraline (ZOLOFT) 50 MG tablet Take 50 mg by mouth in the morning. 50 mg + 100 mg=150 mg daily     No current facility-administered medications for this visit.    SURGICAL HISTORY:  Past Surgical History:  Procedure Laterality Date   ABDOMINAL HYSTERECTOMY     APPENDECTOMY     BOWEL RESECTION N/A 08/01/2013   Procedure: SMALL BOWEL RESECTION;  Surgeon: Shelly Rubenstein, MD;  Location: [facility] OR;  Service: General;  Laterality: N/A;   BRONCHIAL BIOPSY  07/02/2020   Procedure: BRONCHIAL BIOPSIES;  Surgeon: Leslye Peer, MD;  Location[facility] ENDOSCOPY;  Service: Pulmonary;;   BRONCHIAL  BRUSHINGS  07/02/2020   Procedure: BRONCHIAL BRUSHINGS;  Surgeon: Leslye Peer, MD;  Location: [facility] ENDOSCOPY;  Service: Pulmonary;;   BRONCHIAL NEEDLE ASPIRATION BIOPSY  07/02/2020   Procedure: BRONCHIAL NEEDLE ASPIRATION BIOPSIES;  Surgeon: Leslye Peer, MD;  Location: [facility] ENDOSCOPY;  Service: Pulmonary;;   BRONCHIAL WASHINGS  07/02/2020   Procedure: BRONCHIAL WASHINGS;  Surgeon: Leslye Peer, MD;  Location: [facility] ENDOSCOPY;  Service: Pulmonary;;   cataract surgery Bilateral    CHOLECYSTECTOMY  COLON RESECTION     COLON SURGERY     COLOSTOMY N/A 08/01/2013   Procedure: COLOSTOMY;  Surgeon: Harl Bowie, MD;  Location: [facility];  Service: General;  Laterality: N/A;   COLOSTOMY TAKEDOWN  01/24/2014   dr blackman   COLOSTOMY TAKEDOWN N/A 01/24/2014   Procedure: COLOSTOMY TAKEDOWN;  Surgeon: Coralie Keens, MD;  Location: [facility];  Service: General;  Laterality: N/A;   CORONARY ANGIOPLASTY WITH STENT PLACEMENT  12/20/2012   STENT TO OM         DR COOPER   ENDARTERECTOMY Right 08/08/2014   Procedure: RIGHT CAROTID ENDARTERECTOMY WITH HEMASHIELD PATCH ANGIOPLASTY;  Surgeon: Elam Dutch, MD;  Location: [place];  Service: Vascular;  Laterality: Right;   FIDUCIAL MARKER PLACEMENT  07/02/2020   Procedure: FIDUCIAL MARKER PLACEMENT;  Surgeon: Collene Gobble, MD;  Location: [place] ENDOSCOPY;  Service: Pulmonary;;   FOOT SURGERY Left    HAND SURGERY Bilateral    for ganglion cysts   HEMOSTASIS CONTROL  07/02/2020   Procedure: HEMOSTASIS CONTROL;  Surgeon: Collene Gobble, MD;  Location: [facility] ENDOSCOPY;  Service: Pulmonary;;  ice saline and epi   LAPAROTOMY N/A 08/01/2013   Procedure: EXPLORATORY LAPAROTOMY;  Surgeon: Harl Bowie, MD;  Location: [place];  Service: General;  Laterality: N/A;   LEFT HEART CATH AND CORONARY ANGIOGRAPHY N/A 01/10/2019   Procedure: LEFT HEART CATH AND CORONARY ANGIOGRAPHY;  Surgeon: Troy Sine, MD;  Location: [place] CV LAB;  Service: Cardiovascular;   Laterality: N/A;   LEFT HEART CATHETERIZATION WITH CORONARY ANGIOGRAM N/A 12/20/2012   Procedure: LEFT HEART CATHETERIZATION WITH CORONARY ANGIOGRAM;  Surgeon: Blane Ohara, MD;  Location: [facility] CATH LAB;  Service: Cardiovascular;  Laterality: N/A;   PARTIAL COLECTOMY N/A 08/01/2013   Procedure: PARTIAL COLECTOMY;  Surgeon: Harl Bowie, MD;  Location: [place];  Service: General;  Laterality: N/A;   ROTATOR CUFF REPAIR Left    TONSILLECTOMY     TRACHEOSTOMY     feinstein   VIDEO BRONCHOSCOPY WITH ENDOBRONCHIAL NAVIGATION N/A 07/02/2020   Procedure: VIDEO BRONCHOSCOPY WITH ENDOBRONCHIAL NAVIGATION;  Surgeon: Collene Gobble, MD;  Location: [place] ENDOSCOPY;  Service: Pulmonary;  Laterality: N/A;    REVIEW OF SYSTEMS:  A comprehensive review of systems was negative except for: Constitutional: positive for fatigue Respiratory: positive for cough   PHYSICAL EXAMINATION: General appearance: alert, cooperative, fatigued, and no distress Head: Normocephalic, without obvious abnormality, atraumatic Neck: no adenopathy, no JVD, supple, symmetrical, trachea midline, and thyroid not enlarged, symmetric, no tenderness/mass/nodules Lymph nodes: Cervical, supraclavicular, and axillary nodes normal. Resp: clear to auscultation bilaterally Back: symmetric, no curvature. ROM normal. No CVA tenderness. Cardio: regular rate and rhythm, S1, S2 normal, no murmur, click, rub or gallop GI: soft, non-tender; bowel sounds normal; no masses,  no organomegaly Extremities: extremities normal, atraumatic, no cyanosis or edema  ECOG PERFORMANCE STATUS: 1 - Symptomatic but completely ambulatory  Blood pressure (!) 147/64, pulse 86, temperature 98.3 F (36.8 C), temperature source Oral, resp. rate 18, height _0  (1.499 m), weight 151 lb 12.8 oz (68.9 kg), SpO2 100 %.  LABORATORY DATA: Lab Results  Component Value Date   WBC 7.2 07/30/2020   HGB 10.2 (L) 07/30/2020   HCT 32.6 (L) 07/30/2020   MCV 88.1  07/30/2020   PLT 201 07/30/2020      Chemistry      Component Value Date/Time   NA 146 (H) 07/30/2020 0902   NA 139 01/25/2019 1036  K 3.9 07/30/2020 0902   CL 113 (H) 07/30/2020 0902   CO2 23 07/30/2020 0902   BUN 19 07/30/2020 0902   BUN 12 01/25/2019 1036   CREATININE 0.83 07/30/2020 0902      Component Value Date/Time   CALCIUM 9.9 07/30/2020 0902   ALKPHOS 87 07/30/2020 0902   AST 14 (L) 07/30/2020 0902   ALT 11 07/30/2020 0902   BILITOT 0.4 07/30/2020 0902       RADIOGRAPHIC STUDIES: MR Brain W Wo Contrast  Result Date: 07/19/2020 CLINICAL DATA:  Non-small cell lung cancer, pretreatment staging. EXAM: MRI HEAD WITHOUT AND WITH CONTRAST TECHNIQUE: Multiplanar, multiecho pulse sequences of the brain and surrounding structures were obtained without and with intravenous contrast. CONTRAST:  78m GADAVIST GADOBUTROL 1 MMOL/ML IV SOLN COMPARISON:  CT head 01/23/2015. MRI 08/09/2014. FINDINGS: Brain: No acute infarction, hemorrhage, hydrocephalus, extra-axial collection or mass lesion. Confluent remote high right frontoparietal infarct with encephalomalacia and surrounding gliosis. Small remote lacunar infarct in the right basal ganglia. Additional smaller remote cortical infarcts in the right frontal lobe. Small nonenhancing focus of susceptibility artifact in the left frontal lobe appears similar to prior and most likely represents the sequela of chronic microhemorrhage. Additional scattered mild T2/FLAIR hyperintensities within the white matter and pons, most likely related to chronic microvascular ischemic disease. No abnormal enhancement to suggest intraparenchymal metastatic disease. Vascular: Major arterial flow voids are maintained skull base. Small left cerebellar developmental venous anomaly. Skull and upper cervical spine: Normal marrow signal. Sinuses/Orbits: Inferior left maxillary sinus mucosal thickening with possible retention cyst. Mild ethmoid air cell mucosal  thickening. Unremarkable orbits. Other: No sizable mastoid effusions. IMPRESSION: 1. No evidence of acute abnormality or metastatic disease. 2. Confluent remote high right frontoparietal infarct, smaller remote cortical infarcts in the right frontal lobe and small lacunar infarct in the right basal ganglia. Electronically Signed   By: FMargaretha SheffieldMD   On: 07/19/2020 08:45   NM PET Image Initial (PI) Skull Base To Thigh  Result Date: 07/05/2020 CLINICAL DATA:  Initial treatment strategy for right lower lobe lung lesion. EXAM: NUCLEAR MEDICINE PET SKULL BASE TO THIGH TECHNIQUE: 7.2 mCi F-18 FDG was injected intravenously. Full-ring PET imaging was performed from the skull base to thigh after the radiotracer. CT data was obtained and used for attenuation correction and anatomic localization. Fasting blood glucose: 116 mg/dl COMPARISON:  Chest CT 06/27/2020 FINDINGS: Mediastinal blood pool activity: SUV max 2.03 Liver activity: SUV max NA NECK: No hypermetabolic lymph nodes in the neck. Incidental CT findings: Significant muscular uptake is noted in the neck and upper chest area which could be due to the patient taking insulin, eating recently or possibly related to exercise. CHEST: The 2.4 cm right lower lobe partially cavitary mass is hypermetabolic with SUV max of 94.12 There are also ipsilateral contralateral hypermetabolic mediastinal nodes. The subcarinal node has an SUV max 4.76. Right hilar node has an SUV max of 3.35. Pretracheal has an SUV max of 5.22. Prevascular node has an SUV max of 5.47. No other hypermetabolic pulmonary lesions. Incidental CT findings: Stable cardiac enlargement and aortic and coronary artery calcifications. Stable significant underlying lung disease/emphysema. ABDOMEN/PELVIS: No PET-CT findings suspicious for hepatic or adrenal gland metastasis. No abdominal or pelvic hypermetabolic lymphadenopathy. Incidental CT findings: Advanced aortic and branch vessel calcifications but  no aneurysm. The gallbladder is surgically absent. Anterior abdominal wall hernia contains small bowel and colon but no obstructive findings. Cystic area in the left inguinal area could be postoperative change.  No hypermetabolism. SKELETON: Significant diffuse muscular uptake but no worrisome muscle lesions. No findings suspicious for osseous metastatic disease. Incidental CT findings: none IMPRESSION: 1. Hypermetabolic right lower lobe lung lesion consistent with primary lung neoplasm. 2. Mediastinal and hilar lymphadenopathy as detailed above. 3. No findings to suggest abdominal/pelvic metastatic disease or osseous metastatic disease. Electronically Signed   By: Marijo Sanes M.D.   On: 07/05/2020 16:52   DG Chest Port 1 View  Result Date: 07/02/2020 CLINICAL DATA:  Status post bronchoscopy EXAM: PORTABLE CHEST 1 VIEW COMPARISON:  06/27/20 FINDINGS: Cardiac shadow is enlarged but stable. Aortic calcifications are noted. Increased density is noted in the right lung base consistent with parenchymal hemorrhage related to the recent bronchoscopy and biopsy. Fiducial markers are noted on the right thorax is seen. IMPRESSION: No evidence of post bronchoscopy pneumothorax. Increased density in the right base is noted related parenchymal hemorrhage from the recent bronchoscopy. Electronically Signed   By: Inez Catalina M.D.   On: 07/02/2020 11:45   DG C-ARM BRONCHOSCOPY  Result Date: 07/02/2020 C-ARM BRONCHOSCOPY: Fluoroscopy was utilized by the requesting physician.  No radiographic interpretation.    ASSESSMENT AND PLAN: This is a very pleasant 80 years old white female recently diagnosed with a stage IIIb (T1b, N3, M0) non-small cell lung cancer, squamous cell carcinoma presented with right lower lobe lung nodule in addition to bilateral mediastinal and right hilar adenopathy diagnosed in June 2022 with PD-L1 expression of 30%. The patient had MRI of the brain performed less than 2 weeks ago and it showed no  concerning finding for metastatic disease to the brain. The patient is currently undergoing a course of concurrent chemoradiation with weekly carboplatin for AUC of 2 and paclitaxel 45 Mg/M2.  First dose of chemotherapy on 07/30/2020. She is feeling fine except for mild fatigue and cough. I recommended for her to proceed with cycle number one of her systemic chemotherapy today as planned. She is expected to start radiotherapy on August 01, 2020. I will see the patient back for follow-up visit in 2 weeks for evaluation before starting cycle #3. She was advised to call immediately if she has any concerning symptoms in the interval. The patient voices understanding of current disease status and treatment options and is in agreement with the current care plan.  All questions were answered. The patient knows to call the clinic with any problems, questions or concerns. We can certainly see the patient much sooner if necessary. The total time spent in the appointment was 20 minutes.  Disclaimer: This note was dictated with voice recognition software. Similar sounding words can inadvertently be transcribed and may not be corrected upon review.

## 2020-07-30 NOTE — Progress Notes (Signed)
[place] [employer] representative met with patient in the infusion room to discuss transportation needs.  CSW and patient discussed the [employer] program.  CSW enrolled patient and provided 1st set of [employer] cards.    Johnnye Lana, MSW, LCSW, OSW-C Clinical Social Worker [facility] [phone]

## 2020-07-31 ENCOUNTER — Ambulatory Visit: Payer: Medicare Other

## 2020-08-01 ENCOUNTER — Telehealth: Payer: Self-pay | Admitting: *Deleted

## 2020-08-01 ENCOUNTER — Ambulatory Visit: Payer: Medicare Other

## 2020-08-01 DIAGNOSIS — Z510 Encounter for antineoplastic radiation therapy: Secondary | ICD-10-CM | POA: Diagnosis not present

## 2020-08-01 DIAGNOSIS — C3431 Malignant neoplasm of lower lobe, right bronchus or lung: Secondary | ICD-10-CM | POA: Diagnosis not present

## 2020-08-01 LAB — FUNGUS CULTURE WITH STAIN

## 2020-08-01 LAB — FUNGAL ORGANISM REFLEX

## 2020-08-01 LAB — FUNGUS CULTURE RESULT

## 2020-08-01 NOTE — Telephone Encounter (Signed)
-----   Message from Willia Craze, RN sent at 07/31/2020 12:50 PM EDT ----- Regarding: [name] first time taxol/carbo This pt was first time taxol/carbo on 07/30/20. Tolerated well  needs callback. Thanks!

## 2020-08-01 NOTE — Telephone Encounter (Signed)
Called pt to see how she did with her chemotherapy treatment.  She reports doing well & denies problems except not sleeping well for couple of weeks.  She is trying to reach her PCP to discuss. She is also concerned that she received a call this am that insurance wasn't covering her radiation & appt was cancelled for today.  She states that someone is supposed to call her back to let her know about tomorrow.

## 2020-08-01 NOTE — Progress Notes (Signed)
Pt here for patient teaching.  Pt given Radiation and You booklet, skin care instructions, and Sonafine.  Reviewed areas of pertinence such as fatigue, hair loss, skin changes, throat changes, cough, and shortness of breath . Pt able to give teach back of to pat skin and use unscented/gentle soap,apply Sonafine bid and avoid applying anything to skin within 4 hours of treatment. Pt verbalizes understanding of information given and will contact nursing with any questions or concerns.     [email or web address]

## 2020-08-02 ENCOUNTER — Ambulatory Visit
Admission: RE | Admit: 2020-08-02 | Discharge: 2020-08-02 | Disposition: A | Discharge status: Home / Self Care | Payer: Medicare Other | Source: Ambulatory Visit | Attending: Radiation Oncology | Admitting: Radiation Oncology

## 2020-08-02 ENCOUNTER — Other Ambulatory Visit: Payer: Self-pay

## 2020-08-02 DIAGNOSIS — Z510 Encounter for antineoplastic radiation therapy: Secondary | ICD-10-CM | POA: Diagnosis not present

## 2020-08-02 DIAGNOSIS — C3431 Malignant neoplasm of lower lobe, right bronchus or lung: Secondary | ICD-10-CM | POA: Diagnosis not present

## 2020-08-03 ENCOUNTER — Ambulatory Visit
Admission: RE | Admit: 2020-08-03 | Discharge: 2020-08-03 | Disposition: A | Discharge status: Home / Self Care | Payer: Medicare Other | Source: Ambulatory Visit | Attending: Radiation Oncology | Admitting: Radiation Oncology

## 2020-08-03 DIAGNOSIS — C3431 Malignant neoplasm of lower lobe, right bronchus or lung: Secondary | ICD-10-CM | POA: Diagnosis not present

## 2020-08-03 DIAGNOSIS — Z510 Encounter for antineoplastic radiation therapy: Secondary | ICD-10-CM | POA: Diagnosis not present

## 2020-08-03 MED ORDER — SONAFINE EX EMUL: 1.0000 "application " | Freq: Once | CUTANEOUS | Status: DC

## 2020-08-06 ENCOUNTER — Other Ambulatory Visit: Payer: Self-pay

## 2020-08-06 ENCOUNTER — Ambulatory Visit
Admission: RE | Admit: 2020-08-06 | Discharge: 2020-08-06 | Disposition: A | Discharge status: Home / Self Care | Payer: Medicare Other | Source: Ambulatory Visit | Attending: Radiation Oncology | Admitting: Radiation Oncology

## 2020-08-06 ENCOUNTER — Encounter: Payer: Self-pay | Admitting: Internal Medicine

## 2020-08-06 ENCOUNTER — Inpatient Hospital Stay: Payer: Medicare Other

## 2020-08-06 VITALS — BP 130/54 | HR 91 | Temp 98.2°F | Resp 18

## 2020-08-06 DIAGNOSIS — R5383 Other fatigue: Secondary | ICD-10-CM | POA: Diagnosis not present

## 2020-08-06 DIAGNOSIS — G4700 Insomnia, unspecified: Secondary | ICD-10-CM | POA: Diagnosis not present

## 2020-08-06 DIAGNOSIS — I2510 Atherosclerotic heart disease of native coronary artery without angina pectoris: Secondary | ICD-10-CM | POA: Diagnosis not present

## 2020-08-06 DIAGNOSIS — Z510 Encounter for antineoplastic radiation therapy: Secondary | ICD-10-CM | POA: Diagnosis not present

## 2020-08-06 DIAGNOSIS — R059 Cough, unspecified: Secondary | ICD-10-CM | POA: Diagnosis not present

## 2020-08-06 DIAGNOSIS — K2100 Gastro-esophageal reflux disease with esophagitis, without bleeding: Secondary | ICD-10-CM | POA: Diagnosis not present

## 2020-08-06 DIAGNOSIS — C3491 Malignant neoplasm of unspecified part of right bronchus or lung: Secondary | ICD-10-CM | POA: Diagnosis not present

## 2020-08-06 DIAGNOSIS — I2582 Chronic total occlusion of coronary artery: Secondary | ICD-10-CM | POA: Diagnosis not present

## 2020-08-06 DIAGNOSIS — I10 Essential (primary) hypertension: Secondary | ICD-10-CM | POA: Diagnosis not present

## 2020-08-06 DIAGNOSIS — M1990 Unspecified osteoarthritis, unspecified site: Secondary | ICD-10-CM | POA: Diagnosis not present

## 2020-08-06 DIAGNOSIS — F17200 Nicotine dependence, unspecified, uncomplicated: Secondary | ICD-10-CM | POA: Diagnosis not present

## 2020-08-06 DIAGNOSIS — J449 Chronic obstructive pulmonary disease, unspecified: Secondary | ICD-10-CM | POA: Diagnosis not present

## 2020-08-06 DIAGNOSIS — C3431 Malignant neoplasm of lower lobe, right bronchus or lung: Secondary | ICD-10-CM

## 2020-08-06 DIAGNOSIS — Z5111 Encounter for antineoplastic chemotherapy: Secondary | ICD-10-CM | POA: Diagnosis not present

## 2020-08-06 LAB — CMP (CANCER CENTER ONLY)
ALT: 18 U/L (ref 0–44)
AST: 17 U/L (ref 15–41)
Albumin: 3.4 g/dL — ABNORMAL LOW (ref 3.5–5.0)
Alkaline Phosphatase: 76 U/L (ref 38–126)
Anion gap: 9 (ref 5–15)
BUN: 19 mg/dL (ref 8–23)
CO2: 24 mmol/L (ref 22–32)
Calcium: 9.2 mg/dL (ref 8.9–10.3)
Chloride: 112 mmol/L — ABNORMAL HIGH (ref 98–111)
Creatinine: 0.89 mg/dL (ref 0.44–1.00)
GFR, Estimated: >60 mL/min (ref >60)
Glucose, Bld: 111 mg/dL — ABNORMAL HIGH (ref 70–99)
Potassium: 3.6 mmol/L (ref 3.5–5.1)
Sodium: 145 mmol/L (ref 135–145)
Total Bilirubin: 0.5 mg/dL (ref 0.3–1.2)
Total Protein: 6.5 g/dL (ref 6.5–8.1)

## 2020-08-06 LAB — CBC WITH DIFFERENTIAL (CANCER CENTER ONLY)
Abs Immature Granulocytes: 0.02 10*3/uL (ref 0.00–0.07)
Basophils Absolute: 0 10*3/uL (ref 0.0–0.1)
Basophils Relative: 0 %
Eosinophils Absolute: 0.1 10*3/uL (ref 0.0–0.5)
Eosinophils Relative: 2 %
HCT: 32.8 % — ABNORMAL LOW (ref 36.0–46.0)
Hemoglobin: 10.6 g/dL — ABNORMAL LOW (ref 12.0–15.0)
Immature Granulocytes: 0 %
Lymphocytes Relative: 29 %
Lymphs Abs: 1.6 10*3/uL (ref 0.7–4.0)
MCH: 28 pg (ref 26.0–34.0)
MCHC: 32.3 g/dL (ref 30.0–36.0)
MCV: 86.8 fL (ref 80.0–100.0)
Monocytes Absolute: 0.2 10*3/uL (ref 0.1–1.0)
Monocytes Relative: 4 %
Neutro Abs: 3.7 10*3/uL (ref 1.7–7.7)
Neutrophils Relative %: 65 %
Platelet Count: 215 10*3/uL (ref 150–400)
RBC: 3.78 MIL/uL — ABNORMAL LOW (ref 3.87–5.11)
RDW: 14.9 % (ref 11.5–15.5)
WBC Count: 5.6 10*3/uL (ref 4.0–10.5)
nRBC: 0 % (ref 0.0–0.2)

## 2020-08-06 MED ORDER — PALONOSETRON HCL INJECTION 0.25 MG/5ML
0.2500 mg | Freq: Once | INTRAVENOUS | Status: AC
  Administered 2020-08-06: 0.25 mg via INTRAVENOUS

## 2020-08-06 MED ORDER — METHYLPREDNISOLONE SODIUM SUCC 125 MG IJ SOLR
125.0000 mg | Freq: Once | INTRAMUSCULAR | Status: AC | PRN
  Administered 2020-08-06: 125 mg via INTRAVENOUS

## 2020-08-06 MED ORDER — FAMOTIDINE 20 MG IN NS 100 ML IVPB
20.0000 mg | Freq: Once | INTRAVENOUS | Status: AC
  Administered 2020-08-06: 20 mg via INTRAVENOUS

## 2020-08-06 MED ORDER — SODIUM CHLORIDE 0.9 % IV SOLN
Freq: Once | INTRAVENOUS | Status: AC
  Filled 2020-08-06: qty 250

## 2020-08-06 MED ORDER — DEXAMETHASONE SODIUM PHOSPHATE 100 MG/10ML IJ SOLN
20.0000 mg | Freq: Once | INTRAMUSCULAR | Status: AC
  Administered 2020-08-06: 20 mg via INTRAVENOUS
  Filled 2020-08-06: qty 20

## 2020-08-06 MED ORDER — DIPHENHYDRAMINE HCL 50 MG/ML IJ SOLN
INTRAMUSCULAR | Status: AC
  Filled 2020-08-06: qty 1

## 2020-08-06 MED ORDER — PALONOSETRON HCL INJECTION 0.25 MG/5ML
INTRAVENOUS | Status: AC
  Filled 2020-08-06: qty 5

## 2020-08-06 MED ORDER — DIPHENHYDRAMINE HCL 50 MG/ML IJ SOLN
50.0000 mg | Freq: Once | INTRAMUSCULAR | Status: AC
  Administered 2020-08-06: 50 mg via INTRAVENOUS

## 2020-08-06 MED ORDER — SODIUM CHLORIDE 0.9 % IV SOLN
145.0000 mg | Freq: Once | INTRAVENOUS | Status: AC
  Administered 2020-08-06: 150 mg via INTRAVENOUS
  Filled 2020-08-06: qty 15

## 2020-08-06 MED ORDER — SODIUM CHLORIDE 0.9 % IV SOLN
Freq: Once | INTRAVENOUS | Status: DC | PRN
  Filled 2020-08-06: qty 250

## 2020-08-06 MED ORDER — ALBUTEROL SULFATE (2.5 MG/3ML) 0.083% IN NEBU
2.5000 mg | INHALATION_SOLUTION | Freq: Once | RESPIRATORY_TRACT | Status: DC | PRN
  Filled 2020-08-06: qty 3

## 2020-08-06 MED ORDER — SODIUM CHLORIDE 0.9 % IV SOLN
45.0000 mg/m2 | Freq: Once | INTRAVENOUS | Status: AC
  Administered 2020-08-06: 78 mg via INTRAVENOUS
  Filled 2020-08-06: qty 13

## 2020-08-06 MED ORDER — FAMOTIDINE 20 MG IN NS 100 ML IVPB
INTRAVENOUS | Status: AC
  Filled 2020-08-06: qty 100

## 2020-08-06 NOTE — Progress Notes (Signed)
Met with patient and accompanying adult at registration to complete grant process.  Patient approved for one-time $1000 [employer] grant to assist with personal expenses while going through treatment.Discussed in detail expenses and how they are covered. She has a copy of the approval letter and expense sheet along with the Outpatient pharmacy information in a green folder. She received a gift card today.  She has my as well for any additional financial questions or concerns.

## 2020-08-06 NOTE — Patient Instructions (Signed)
Carboplatin injection What is this medication? CARBOPLATIN (KAR boe pla tin) is a chemotherapy drug. It targets fast dividing cells, like cancer cells, and causes these cells to die. This medicine is usedto treat ovarian cancer and many other cancers. This medicine may be used for other purposes; ask your health care provider orpharmacist if you have questions. COMMON BRAND NAME(S): Paraplatin What should I tell my care team before I take this medication? They need to know if you have any of these conditions: blood disorders hearing problems kidney disease recent or ongoing radiation therapy an unusual or allergic reaction to carboplatin, cisplatin, other chemotherapy, other medicines, foods, dyes, or preservatives pregnant or trying to get pregnant breast-feeding How should I use this medication? This drug is usually given as an infusion into a vein. It is administered in [place] or clinic by a specially trained health care professional. Talk to your pediatrician regarding the use of this medicine in children.Special care may be needed. Overdosage: If you think you have taken too much of this medicine contact apoison control center or emergency room at once. NOTE: This medicine is only for you. Do not share this medicine with others. What if I miss a dose? It is important not to miss a dose. Call your doctor or health careprofessional if you are unable to keep an appointment. What may interact with this medication? medicines for seizures medicines to increase blood counts like filgrastim, pegfilgrastim, sargramostim some antibiotics like amikacin, gentamicin, neomycin, streptomycin, tobramycin vaccines Talk to your doctor or health care professional before taking any of thesemedicines: acetaminophen aspirin ibuprofen ketoprofen naproxen This list may not describe all possible interactions. Give your health care provider a list of all the medicines, herbs, non-prescription drugs, or  dietary supplements you use. Also tell them if you smoke, drink alcohol, or use illegaldrugs. Some items may interact with your medicine. What should I watch for while using this medication? Your condition will be monitored carefully while you are receiving this medicine. You will need important blood work done while you are taking thismedicine. This drug may make you feel generally unwell. This is not uncommon, as chemotherapy can affect healthy cells as well as cancer cells. Report any side effects. Continue your course of treatment even though you feel ill unless yourdoctor tells you to stop. In some cases, you may be given additional medicines to help with side effects.Follow all directions for their use. Call your doctor or health care professional for advice if you get a fever, chills or sore throat, or other symptoms of a cold or flu. Do not treat yourself. This drug decreases your body's ability to fight infections. Try toavoid being around people who are sick. This medicine may increase your risk to bruise or bleed. Call your doctor orhealth care professional if you notice any unusual bleeding. Be careful brushing and flossing your teeth or using a toothpick because you may get an infection or bleed more easily. If you have any dental work done,tell your dentist you are receiving this medicine. Avoid taking products that contain aspirin, acetaminophen, ibuprofen, naproxen, or ketoprofen unless instructed by your doctor. These medicines may hide afever. Do not become pregnant while taking this medicine. Women should inform their doctor if they wish to become pregnant or think they might be pregnant. There is a potential for serious side effects to an unborn child. Talk to your health care professional or pharmacist for more information. Do not breast-feed aninfant while taking this medicine. What side effects may  I notice from receiving this medication? Side effects that you should report to your  doctor or health care professionalas soon as possible: allergic reactions like skin rash, itching or hives, swelling of the face, lips, or tongue signs of infection - fever or chills, cough, sore throat, pain or difficulty passing urine signs of decreased platelets or bleeding - bruising, pinpoint red spots on the skin, black, tarry stools, nosebleeds signs of decreased red blood cells - unusually weak or tired, fainting spells, lightheadedness breathing problems changes in hearing changes in vision chest pain high blood pressure low blood counts - This drug may decrease the number of white blood cells, red blood cells and platelets. You may be at increased risk for infections and bleeding. nausea and vomiting pain, swelling, redness or irritation at the injection site pain, tingling, numbness in the hands or feet problems with balance, talking, walking trouble passing urine or change in the amount of urine Side effects that usually do not require medical attention (report to yourdoctor or health care professional if they continue or are bothersome): hair loss loss of appetite metallic taste in the mouth or changes in taste This list may not describe all possible side effects. Call your doctor for medical advice about side effects. You may report side effects to FDA at1-800-FDA-1088. Where should I keep my medication? This drug is given in a hospital or clinic and will not be stored at home. NOTE: This sheet is a summary. It may not cover all possible information. If you have questions about this medicine, talk to your doctor, pharmacist, orhealth care provider.  2022 Elsevier/Gold Standard (2007-04-13 14:38:05) Paclitaxel injection What is this medication? PACLITAXEL (PAK li TAX el) is a chemotherapy drug. It targets fast dividing cells, like cancer cells, and causes these cells to die. This medicine is used to treat ovarian cancer, breast cancer, lung cancer, Kaposi's sarcoma, andother  cancers. This medicine may be used for other purposes; ask your health care provider orpharmacist if you have questions. COMMON BRAND NAME(S): Onxol, Taxol What should I tell my care team before I take this medication? They need to know if you have any of these conditions: history of irregular heartbeat liver disease low blood counts, like low white cell, platelet, or red cell counts lung or breathing disease, like asthma tingling of the fingers or toes, or other nerve disorder an unusual or allergic reaction to paclitaxel, alcohol, polyoxyethylated castor oil, other chemotherapy, other medicines, foods, dyes, or preservatives pregnant or trying to get pregnant breast-feeding How should I use this medication? This drug is given as an infusion into a vein. It is administered in a hospitalor clinic by a specially trained health care professional. Talk to your pediatrician regarding the use of this medicine in children.Special care may be needed. Overdosage: If you think you have taken too much of this medicine contact apoison control center or emergency room at once. NOTE: This medicine is only for you. Do not share this medicine with others. What if I miss a dose? It is important not to miss your dose. Call your doctor or health careprofessional if you are unable to keep an appointment. What may interact with this medication? Do not take this medicine with any of the following medications: live virus vaccines This medicine may also interact with the following medications: antiviral medicines for hepatitis, HIV or AIDS certain antibiotics like erythromycin and clarithromycin certain medicines for fungal infections like ketoconazole and itraconazole certain medicines for seizures like carbamazepine, phenobarbital, phenytoin  gemfibrozil nefazodone rifampin St. John's wort This list may not describe all possible interactions. Give your health care provider a list of all the medicines,  herbs, non-prescription drugs, or dietary supplements you use. Also tell them if you smoke, drink alcohol, or use illegaldrugs. Some items may interact with your medicine. What should I watch for while using this medication? Your condition will be monitored carefully while you are receiving this medicine. You will need important blood work done while you are taking thismedicine. This medicine can cause serious allergic reactions. To reduce your risk you will need to take other medicine(s) before treatment with this medicine. If you experience allergic reactions like skin rash, itching or hives, swelling of theface, lips, or tongue, tell your doctor or health care professional right away. In some cases, you may be given additional medicines to help with side effects.Follow all directions for their use. This drug may make you feel generally unwell. This is not uncommon, as chemotherapy can affect healthy cells as well as cancer cells. Report any side effects. Continue your course of treatment even though you feel ill unless yourdoctor tells you to stop. Call your doctor or health care professional for advice if you get a fever, chills or sore throat, or other symptoms of a cold or flu. Do not treat yourself. This drug decreases your body's ability to fight infections. Try toavoid being around people who are sick. This medicine may increase your risk to bruise or bleed. Call your doctor orhealth care professional if you notice any unusual bleeding. Be careful brushing and flossing your teeth or using a toothpick because you may get an infection or bleed more easily. If you have any dental work done,tell your dentist you are receiving this medicine. Avoid taking products that contain aspirin, acetaminophen, ibuprofen, naproxen, or ketoprofen unless instructed by your doctor. These medicines may hide afever. Do not become pregnant while taking this medicine. Women should inform their doctor if they wish to  become pregnant or think they might be pregnant. There is a potential for serious side effects to an unborn child. Talk to your health care professional or pharmacist for more information. Do not breast-feed aninfant while taking this medicine. Men are advised not to father a child while receiving this medicine. This product may contain alcohol. Ask your pharmacist or healthcare provider if this medicine contains alcohol. Be sure to tell all healthcare providers you are taking this medicine. Certain medicines, like metronidazole and disulfiram, can cause an unpleasant reaction when taken with alcohol. The reaction includes flushing, headache, nausea, vomiting, sweating, and increased thirst. Thereaction can last from 30 minutes to several hours. What side effects may I notice from receiving this medication? Side effects that you should report to your doctor or health care professionalas soon as possible: allergic reactions like skin rash, itching or hives, swelling of the face, lips, or tongue breathing problems changes in vision fast, irregular heartbeat high or low blood pressure mouth sores pain, tingling, numbness in the hands or feet signs of decreased platelets or bleeding - bruising, pinpoint red spots on the skin, black, tarry stools, blood in the urine signs of decreased red blood cells - unusually weak or tired, feeling faint or lightheaded, falls signs of infection - fever or chills, cough, sore throat, pain or difficulty passing urine signs and symptoms of liver injury like dark yellow or brown urine; general ill feeling or flu-like symptoms; light-colored stools; loss of appetite; nausea; right upper belly pain; unusually weak or  tired; yellowing of the eyes or skin swelling of the ankles, feet, hands unusually slow heartbeat Side effects that usually do not require medical attention (report to yourdoctor or health care professional if they continue or are bothersome): diarrhea hair  loss loss of appetite muscle or joint pain nausea, vomiting pain, redness, or irritation at site where injected tiredness This list may not describe all possible side effects. Call your doctor for medical advice about side effects. You may report side effects to FDA at1-800-FDA-1088. Where should I keep my medication? This drug is given in a hospital or clinic and will not be stored at home. NOTE: This sheet is a summary. It may not cover all possible information. If you have questions about this medicine, talk to your doctor, pharmacist, orhealth care provider.  2022 Elsevier/Gold Standard (2018-12-08 13:37:23) [place]  Discharge Instructions: Thank you for choosing [facility] to provide your oncology and hematology care.   If you have a lab appointment with the [facility], please go directly to the [facility] and check in at the registration area.   Wear comfortable clothing and clothing appropriate for easy access to any Portacath or PICC line.   We strive to give you quality time with your provider. You may need to reschedule your appointment if you arrive late (15 or more minutes).  Arriving late affects you and other patients whose appointments are after yours.  Also, if you miss three or more appointments without notifying the office, you may be dismissed from the clinic at the provider's discretion.      For prescription refill requests, have your pharmacy contact our office and allow 72 hours for refills to be completed.    Today you received the following chemotherapy and/or immunotherapy agents Taxol and Carboplatin       To help prevent nausea and vomiting after your treatment, we encourage you to take your nausea medication as directed.  BELOW ARE SYMPTOMS THAT SHOULD BE REPORTED IMMEDIATELY: *FEVER GREATER THAN 100.4 F (38 C) OR HIGHER *CHILLS OR SWEATING *NAUSEA AND VOMITING THAT IS NOT CONTROLLED WITH YOUR NAUSEA  MEDICATION *UNUSUAL SHORTNESS OF BREATH *UNUSUAL BRUISING OR BLEEDING *URINARY PROBLEMS (pain or burning when urinating, or frequent urination) *BOWEL PROBLEMS (unusual diarrhea, constipation, pain near the anus) TENDERNESS IN MOUTH AND THROAT WITH OR WITHOUT PRESENCE OF ULCERS (sore throat, sores in mouth, or a toothache) UNUSUAL RASH, SWELLING OR PAIN  UNUSUAL VAGINAL DISCHARGE OR ITCHING   Items with * indicate a potential emergency and should be followed up as soon as possible or go to the Emergency Department if any problems should occur.  Please show the CHEMOTHERAPY ALERT CARD or IMMUNOTHERAPY ALERT CARD at check-in to the Emergency Department and triage nurse.  Should you have questions after your visit or need to cancel or reschedule your appointment, please contact [facility]  Dept: [phone]  and follow the prompts.  Office hours are 8:00 a.m. to 4:30 p.m. Monday - Friday. Please note that voicemails left after 4:00 p.m. may not be returned until the following business day.  We are closed weekends and major holidays. You have access to a nurse at all times for urgent questions. Please call the main number to the clinic Dept: [phone] and follow the prompts.   For any non-urgent questions, you may also contact your provider using MyChart. We now offer e-Visits for anyone 9 and older to request care online for non-urgent  symptoms. For details visit [email or web address].   Also download the MyChart app! Go to the app store, search "MyChart", open the app, select [facility], and log in with your MyChart username and password.  Due to Covid, a mask is required upon entering the hospital/clinic. If you do not have a mask, one will be given to you upon arrival. For doctor visits, patients may have 1 support person aged 81 or older with them. For treatment visits, patients cannot have anyone with them due to current Covid guidelines and our  immunocompromised population.

## 2020-08-06 NOTE — Progress Notes (Signed)
1720 Pt started coughing and wheezing.  Taxol stopped.    Volume left to be infused 159 ml.  VS 97/64 O2 stat 93-94%.  Placed pt on 3l [place].  Solumedrol 125 mg IV given.  NS given; then slowed to [facility] R/T HX CHF.    1730 BP 140/82. O2 100% on 3l  1750  Pt has decreased in amount of coughing.  Wheezing has resolved.  Pt reports feeling better. O2 sat 100% on 2 liters.  Dr Lorenso Courier notified.  MD ordered to stop Taxol.  MD ordered to proceed with Carboplatin.  O2 sat on room air 96%

## 2020-08-07 ENCOUNTER — Ambulatory Visit
Admission: RE | Admit: 2020-08-07 | Discharge: 2020-08-07 | Disposition: A | Discharge status: Home / Self Care | Payer: Medicare Other | Source: Ambulatory Visit | Attending: Radiation Oncology | Admitting: Radiation Oncology

## 2020-08-07 DIAGNOSIS — Z510 Encounter for antineoplastic radiation therapy: Secondary | ICD-10-CM | POA: Diagnosis not present

## 2020-08-07 DIAGNOSIS — C3431 Malignant neoplasm of lower lobe, right bronchus or lung: Secondary | ICD-10-CM | POA: Diagnosis not present

## 2020-08-08 ENCOUNTER — Ambulatory Visit
Admission: RE | Admit: 2020-08-08 | Discharge: 2020-08-08 | Disposition: A | Discharge status: Home / Self Care | Payer: Medicare Other | Source: Ambulatory Visit | Attending: Radiation Oncology | Admitting: Radiation Oncology

## 2020-08-08 ENCOUNTER — Other Ambulatory Visit: Payer: Self-pay

## 2020-08-08 DIAGNOSIS — C3431 Malignant neoplasm of lower lobe, right bronchus or lung: Secondary | ICD-10-CM | POA: Diagnosis not present

## 2020-08-08 DIAGNOSIS — Z510 Encounter for antineoplastic radiation therapy: Secondary | ICD-10-CM | POA: Diagnosis not present

## 2020-08-08 NOTE — Progress Notes (Signed)
[name]  [name] Dress, MD [name] [id number]  DIAGNOSIS: Stage IIIB (T1b, N3, M0) non-small cell lung cancer, squamous cell carcinoma presented with right lower lobe lung nodule in addition to bilateral mediastinal and right hilar lymphadenopathy diagnosed in June 2022.   PDL TPS 30%.  PRIOR THERAPY: None  CURRENT THERAPY: A course of concurrent chemoradiation with weekly carboplatin for AUC of 2 and paclitaxel 45 Mg/M2.  First cycle July 30, 2020. Status post 2 cycles. Dr. Julien Nordmann changed paclitaxel to abraxane 40 mg/m2 starting from cycle #4 due to a reaction to paclitaxel.   INTERVAL HISTORY: [name] 80 y.o. female returns to the clinic today for a follow-up visit accompanied by her husband.  The patient is feeling fairly well today without any concerning complaints except it is challenging to get IV access. She is scheduled for a port-a-cath next week on 08/23/20.  At her last appointment, the patient had some cough and wheezing on 08/06/2020 with her second cycle of treatment with Taxol.  The patient received Solu-Medrol.  And Taxol was stopped. Her symptoms resolved.   Otherwise, the patient started radiation treatment.  Her last radiation is scheduled on 09/17/2020.  Besides the reaction, the patient tolerated her last cycle of treatment well without any other adverse side effects. She reports insomnia for which she takes remeron and tylenol PM if needed. She is also on remeron for her decreased appetite. She reports food does not taste good. She denies thrush. She has been trying to try different food to see if something is appealing. She denies any fever, chills, or night sweats. She denies any chest pain or hemoptysis.  She has some dry cough and dyspnea on exertion which is stable to mildly worse. She sucks on lifesavers for her cough which helps. She denies any nausea, vomiting, or constipation. She  reports mild self limiting loose stools "on and off" but states it does not warrant any imodium.  She denies any headache or visual changes.  She denies any rashes or skin changes.  The patient is also working with [employer], [employer] due to her transportation constraints.  The patient is here today for evaluation and repeat blood work before starting cycle #3.     MEDICAL HISTORY: Past Medical History:  Diagnosis Date   Acute respiratory failure with hypoxia s/p tracheostomy 07/20/2013   Anemia    a. 7-08/2013 felt due to recent critical illness/chronic disease.   Arthritis    handds, knees & back    Barrett's esophagus    CAD (coronary artery disease)    a. Mod dz 2010 initially mgd medically. b. 12/2012 - angina s/p PTCA/DES to mid-circumflex, PTCA/DES to first OM.    Carotid artery disease ([place])    a. 60-70% bilat ICA stenosis by dopplers 05/2012.   Chronic back pain    deteriorating;DDD   Colonic obstruction due to suspected colitis; s/p colectomy/colostomy    a. See SBO.   Complication of anesthesia    hard to wake up from anesthesia    COPD (chronic obstructive pulmonary disease) (HCC)    Depression    takes Remeron and Zoloft daily   Dizziness    was on Bentyl which caused this-took off of it and no problems since   DVT of axillary vein, acute right ([place])    a. Dx 07/2013 felt due to R IJ central line that had been inserted 7/14. Not on anticoag  due to GIB issues and small cerebral bleed.   GERD (gastroesophageal reflux disease)    Heart murmur    History of bronchitis    > 57yrs ago    History of colon polyps    History of hiatal hernia    History of kidney stones    was told it was stable but doesn't know for sure that she ever passed it   HTN (hypertension)    Hypertension    takes Metoprolol daily   Intracranial hemorrhage ([place])    a. 08/11/13 - per DC summary, tiny SAH vs SDH done for altered mentation, anticoag stopped including aspirin.   Joint pain    Neck pain     DDD   Other and unspecified hyperlipidemia    takes Lipitor daily   Pneumonia    hx of->15 yrs ago   Protein-calorie malnutrition, severe w/ electrolyte imbalance    a. Severe hypoalbuminemia leading to 3rd spacing including anasarca and pulm edema 07/2013.   SBO (small bowel obstruction) ([place])    a. Lysis of adhesions and ovarian cystectomy 04/2013 for sBO. b. Admitted 07/2013 for colonic obstruction due to suspected colitis, s/p partial colectomy/colostomy 08/01/13. Admission complicated by anasarca, acute resp failure requiring tracheostomy, decannulated 08/25/13. c. Recurrent SBO8/2015, NGT placed for decompression.    Stroke [facility]) early 80's   right sided weakness--TIA    ALLERGIES:  is allergic to pregabalin and sulfonamide derivatives.  MEDICATIONS:  Current Outpatient Medications  Medication Sig Dispense Refill   aspirin EC 81 MG tablet Take 2 tablets (162 mg total) by mouth in the morning. Okay to restart this medication on 07/04/2020 30 tablet 11   atorvastatin (LIPITOR) 80 MG tablet Take 80 mg by mouth every evening.      buPROPion (WELLBUTRIN XL) 150 MG 24 hr tablet Take 1 tablet (150 mg total) by mouth daily. 90 tablet 3   CALCIUM PO Take 1 tablet by mouth in the morning.     Cholecalciferol (VITAMIN D3 PO) Take 1 tablet by mouth in the morning.     fish oil-omega-3 fatty acids 1000 MG capsule Take 1,000 mg by mouth in the morning.     fluticasone (FLONASE) 50 MCG/ACT nasal spray Place 1 spray into both nostrils daily as needed for allergies.     folic acid (FOLVITE) 828 MCG tablet Take 800 mcg by mouth in the morning.     lidocaine-prilocaine (EMLA) cream Apply 1 application topically as needed. 30 g 2   metoprolol succinate (TOPROL-XL) 50 MG 24 hr tablet TAKE 1 TABLET BY MOUTH  DAILY 90 tablet 3   mirtazapine (REMERON) 45 MG tablet Take 45 mg by mouth at bedtime.      nitroGLYCERIN (NITROSTAT) 0.4 MG SL tablet DISSOLVE 1 TABLET UNDER THE TONGUE EVERY 5 MINUTES AS  NEEDED FOR  CHEST PAIN. MAX  OF 3 TABLETS IN 15 MINUTES. CALL 911 IF PAIN PERSISTS. 25 tablet 3   ondansetron (ZOFRAN) 4 MG tablet Take 4 mg by mouth every 8 (eight) hours as needed for nausea or vomiting.      pantoprazole (PROTONIX) 40 MG tablet Take 40 mg by mouth in the morning.     prochlorperazine (COMPAZINE) 10 MG tablet Take 1 tablet (10 mg total) by mouth every 6 (six) hours as needed for nausea or vomiting. 30 tablet 0   sertraline (ZOLOFT) 100 MG tablet Take 200 mg by mouth in the morning. 100 mg + 100 mg= 200 mg daily  sertraline (ZOLOFT) 50 MG tablet Take 50 mg by mouth in the morning. 50 mg + 100 mg=150 mg daily     No current facility-administered medications for this visit.   Facility-Administered Medications Ordered in Other Visits  Medication Dose Route Frequency Provider Last Rate Last Admin   CARBOplatin (PARAPLATIN) 150 mg in sodium chloride 0.9 % 100 mL chemo infusion  150 mg Intravenous Once Curt Bears, MD       dexamethasone (DECADRON) 20 mg in sodium chloride 0.9 % 50 mL IVPB  20 mg Intravenous Once Curt Bears, MD        SURGICAL HISTORY:  Past Surgical History:  Procedure Laterality Date   ABDOMINAL HYSTERECTOMY     APPENDECTOMY     BOWEL RESECTION N/A 08/01/2013   Procedure: SMALL BOWEL RESECTION;  Surgeon: Harl Bowie, MD;  Location: [place];  Service: General;  Laterality: N/A;   BRONCHIAL BIOPSY  07/02/2020   Procedure: BRONCHIAL BIOPSIES;  Surgeon: Collene Gobble, MD;  Location: [facility] ENDOSCOPY;  Service: Pulmonary;;   BRONCHIAL BRUSHINGS  07/02/2020   Procedure: BRONCHIAL BRUSHINGS;  Surgeon: Collene Gobble, MD;  Location: [place];  Service: Pulmonary;;   BRONCHIAL NEEDLE ASPIRATION BIOPSY  07/02/2020   Procedure: BRONCHIAL NEEDLE ASPIRATION BIOPSIES;  Surgeon: Collene Gobble, MD;  Location: [facility] ENDOSCOPY;  Service: Pulmonary;;   BRONCHIAL WASHINGS  07/02/2020   Procedure: BRONCHIAL WASHINGS;  Surgeon: Collene Gobble, MD;  Location: [place] ENDOSCOPY;   Service: Pulmonary;;   cataract surgery Bilateral    CHOLECYSTECTOMY     COLON RESECTION     COLON SURGERY     COLOSTOMY N/A 08/01/2013   Procedure: COLOSTOMY;  Surgeon: Harl Bowie, MD;  Location: [place];  Service: General;  Laterality: N/A;   COLOSTOMY TAKEDOWN  01/24/2014   dr blackman   COLOSTOMY TAKEDOWN N/A 01/24/2014   Procedure: COLOSTOMY TAKEDOWN;  Surgeon: Coralie Keens, MD;  Location: [place];  Service: General;  Laterality: N/A;   CORONARY ANGIOPLASTY WITH STENT PLACEMENT  12/20/2012   STENT TO OM         DR COOPER   ENDARTERECTOMY Right 08/08/2014   Procedure: RIGHT CAROTID ENDARTERECTOMY WITH HEMASHIELD PATCH ANGIOPLASTY;  Surgeon: Elam Dutch, MD;  Location: [place];  Service: Vascular;  Laterality: Right;   FIDUCIAL MARKER PLACEMENT  07/02/2020   Procedure: FIDUCIAL MARKER PLACEMENT;  Surgeon: Collene Gobble, MD;  Location: [place] ENDOSCOPY;  Service: Pulmonary;;   FOOT SURGERY Left    HAND SURGERY Bilateral    for ganglion cysts   HEMOSTASIS CONTROL  07/02/2020   Procedure: HEMOSTASIS CONTROL;  Surgeon: Collene Gobble, MD;  Location: [place] ENDOSCOPY;  Service: Pulmonary;;  ice saline and epi   LAPAROTOMY N/A 08/01/2013   Procedure: EXPLORATORY LAPAROTOMY;  Surgeon: Harl Bowie, MD;  Location: [place];  Service: General;  Laterality: N/A;   LEFT HEART CATH AND CORONARY ANGIOGRAPHY N/A 01/10/2019   Procedure: LEFT HEART CATH AND CORONARY ANGIOGRAPHY;  Surgeon: Troy Sine, MD;  Location: [place] CV LAB;  Service: Cardiovascular;  Laterality: N/A;   LEFT HEART CATHETERIZATION WITH CORONARY ANGIOGRAM N/A 12/20/2012   Procedure: LEFT HEART CATHETERIZATION WITH CORONARY ANGIOGRAM;  Surgeon: Blane Ohara, MD;  Location: [facility] CATH LAB;  Service: Cardiovascular;  Laterality: N/A;   PARTIAL COLECTOMY N/A 08/01/2013   Procedure: PARTIAL COLECTOMY;  Surgeon: Harl Bowie, MD;  Location: [place];  Service: General;  Laterality: N/A;   ROTATOR CUFF REPAIR Left  TONSILLECTOMY     TRACHEOSTOMY     feinstein   VIDEO BRONCHOSCOPY WITH ENDOBRONCHIAL NAVIGATION N/A 07/02/2020   Procedure: VIDEO BRONCHOSCOPY WITH ENDOBRONCHIAL NAVIGATION;  Surgeon: Collene Gobble, MD;  Location: [place] ENDOSCOPY;  Service: Pulmonary;  Laterality: N/A;    REVIEW OF SYSTEMS:   Review of Systems  Constitutional: Positive for decreased appetite and fatigue.  Negative for chills,  fever and unexpected weight change.  HENT: Positive for taste alterations.  Negative for mouth sores, nosebleeds, sore throat and trouble swallowing.   Eyes: Negative for eye problems and icterus.  Respiratory: Positive for dry cough and dyspnea on exertion.  Negative for hemoptysis and wheezing.   Cardiovascular: Negative for chest pain and leg swelling.  Gastrointestinal: Positive for intermittent loose stool.  Negative for abdominal pain, constipation, diarrhea, nausea and vomiting.  Genitourinary: Negative for bladder incontinence, difficulty urinating, dysuria, frequency and hematuria.   Musculoskeletal: Negative for back pain, gait problem, neck pain and neck stiffness.  Skin: Negative for itching and rash.  Neurological: Negative for dizziness, extremity weakness, gait problem, headaches, light-headedness and seizures.  Hematological: Negative for adenopathy. Does not bruise/bleed easily.  Psychiatric/Behavioral: Negative for confusion, depression and sleep disturbance. The patient is not nervous/anxious.     PHYSICAL EXAMINATION:  Blood pressure (!) 147/71, pulse 84, temperature (!) 97.4 F (36.3 C), temperature source Temporal, resp. rate 19, height $RemoveBe'4\' 11"'CaATtRZUs$  (1.499 m), weight 151 lb 3.2 oz (68.6 kg), SpO2 100 %.  ECOG PERFORMANCE STATUS: 1  Physical Exam  Constitutional: Oriented to person, place, and time and well-developed, well-nourished, and in no distress.  HENT:  Head: Normocephalic and atraumatic.  Mouth/Throat: Oropharynx is clear and moist. No oropharyngeal exudate.  Eyes:  Conjunctivae are normal. Right eye exhibits no discharge. Left eye exhibits no discharge. No scleral icterus.  Neck: Normal range of motion. Neck supple.  Cardiovascular: Normal rate, regular rhythm, normal heart sounds and intact distal pulses.   Pulmonary/Chest: Effort normal and breath sounds normal. No respiratory distress. No wheezes. No rales.  Abdominal: Soft. Bowel sounds are normal. Exhibits no distension and no mass. There is no tenderness.  Musculoskeletal: Normal range of motion. Exhibits no edema.  Lymphadenopathy:    No cervical adenopathy.  Neurological: Alert and oriented to person, place, and time. Exhibits normal muscle tone. Gait normal. Coordination normal.  Skin: Skin is warm and dry. No rash noted. Not diaphoretic. No erythema. No pallor.  Psychiatric: Mood, memory and judgment normal.  Vitals reviewed.  LABORATORY DATA: Lab Results  Component Value Date   WBC 4.0 08/13/2020   HGB 9.7 (L) 08/13/2020   HCT 30.8 (L) 08/13/2020   MCV 88.0 08/13/2020   PLT 180 08/13/2020      Chemistry      Component Value Date/Time   NA 146 (H) 08/13/2020 1007   NA 139 01/25/2019 1036   K 3.6 08/13/2020 1007   CL 115 (H) 08/13/2020 1007   CO2 24 08/13/2020 1007   BUN 14 08/13/2020 1007   BUN 12 01/25/2019 1036   CREATININE 0.76 08/13/2020 1007      Component Value Date/Time   CALCIUM 8.6 (L) 08/13/2020 1007   ALKPHOS 68 08/13/2020 1007   AST 15 08/13/2020 1007   ALT 14 08/13/2020 1007   BILITOT 0.5 08/13/2020 1007       RADIOGRAPHIC STUDIES:  MR Brain W Wo Contrast  Result Date: 07/19/2020 CLINICAL DATA:  Non-small cell lung cancer, pretreatment staging. EXAM: MRI HEAD WITHOUT AND WITH CONTRAST TECHNIQUE: Multiplanar,  multiecho pulse sequences of the brain and surrounding structures were obtained without and with intravenous contrast. CONTRAST:  29mL GADAVIST GADOBUTROL 1 MMOL/ML IV SOLN COMPARISON:  CT head 01/23/2015. MRI 08/09/2014. FINDINGS: Brain: No acute  infarction, hemorrhage, hydrocephalus, extra-axial collection or mass lesion. Confluent remote high right frontoparietal infarct with encephalomalacia and surrounding gliosis. Small remote lacunar infarct in the right basal ganglia. Additional smaller remote cortical infarcts in the right frontal lobe. Small nonenhancing focus of susceptibility artifact in the left frontal lobe appears similar to prior and most likely represents the sequela of chronic microhemorrhage. Additional scattered mild T2/FLAIR hyperintensities within the white matter and pons, most likely related to chronic microvascular ischemic disease. No abnormal enhancement to suggest intraparenchymal metastatic disease. Vascular: Major arterial flow voids are maintained skull base. Small left cerebellar developmental venous anomaly. Skull and upper cervical spine: Normal marrow signal. Sinuses/Orbits: Inferior left maxillary sinus mucosal thickening with possible retention cyst. Mild ethmoid air cell mucosal thickening. Unremarkable orbits. Other: No sizable mastoid effusions. IMPRESSION: 1. No evidence of acute abnormality or metastatic disease. 2. Confluent remote high right frontoparietal infarct, smaller remote cortical infarcts in the right frontal lobe and small lacunar infarct in the right basal ganglia. Electronically Signed   By: Margaretha Sheffield MD   On: 07/19/2020 08:45     ASSESSMENT/PLAN:  This is a very pleasant 80 year old Caucasian female diagnosed with stage IIIb (T1b, N3, M0) non-small cell lung cancer, squamous cell carcinoma.  She presented with a right lower lobe lung nodule in addition to bilateral mediastinal and right hilar adenopathy.  She was diagnosed in June 2022.  Her PD-L1 expression is 30%.  The patient is currently undergoing treatment with weekly concurrent chemoradiation with carboplatin for an AUC of 2 and paclitaxel 45 mg per metered squared.  Her first dose of treatment was on 07/30/2020.  This patient is  status post 2 cycles.  Due to her reaction to paclitaxel, Dr. Julien Nordmann is going to change her Taxol to Abraxane 40 mg/m2.  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  Her last day of radiation is scheduled for September 17, 2020  The patient was seen with Dr. Julien Nordmann today.  Labs reviewed.  Recommend that she proceed with cycle #3 today as scheduled with carboplatin today.  Hopefully, by next week Abraxane will be approved and she will start weekly carboplatin and Abraxane next week.   We will see her back for follow-up visit in 2 weeks for evaluation before starting cycle #5.  I sent her a prescription for Emla cream since she is getting her Port-A-Cath on 08/23/2020.  We attempted to remove her Port-A-Cath appointment earlier but due to the backorder of Port-A-Cath, this is the earliest appointment.  The patient was advised to call immediately if she has any concerning symptoms in the interval. The patient voices understanding of current disease status and treatment options and is in agreement with the current care plan. All questions were answered. The patient knows to call the clinic with any problems, questions or concerns. We can certainly see the patient much sooner if necessary  No orders of the defined types were placed in this encounter.     Arlett Goold L Anthany Thornhill, PA-C 08/13/20  ADDENDUM: Hematology/Oncology Attending: I had a face-to-face encounter with the patient today.  I reviewed her record, labs and recommended her care plan.  This is a very pleasant 80 years old white female diagnosed with a stage IIIb (T1b, N3, M0) non-small cell lung cancer, squamous cell carcinoma  in June 2022.  She has PD-L1 expression of 30%. The patient is currently undergoing a course of concurrent chemoradiation initially with carboplatin for AUC of 2 and paclitaxel 45 Mg/M2 status post 2 cycles.  Unfortunately the patient has hypersensitivity reaction to the paclitaxel during cycle #2 with respiratory  symptoms and this was discontinued. I will replace the paclitaxel with Abraxane 40 mg/M2 starting from cycle #4 after we receive preauthorization for her treatment.  For cycle #3 she will proceed with single agent carboplatin. The patient will come back for follow-up visit in 2 weeks for evaluation and management of any adverse effect of her treatment. For the IV access, she is scheduled to have a Port-A-Cath placed on August 23, 2020. She was advised to call immediately if she has any other concerning symptoms in the interval. The total time spent in the appointment was 20 minutes. Disclaimer: This [name] was dictated with voice recognition software. Similar sounding words can inadvertently be transcribed and may be missed upon review. Eilleen Kempf, MD 08/13/20

## 2020-08-09 ENCOUNTER — Other Ambulatory Visit: Payer: Self-pay | Admitting: *Deleted

## 2020-08-09 ENCOUNTER — Ambulatory Visit
Admission: RE | Admit: 2020-08-09 | Discharge: 2020-08-09 | Disposition: A | Discharge status: Home / Self Care | Payer: Medicare Other | Source: Ambulatory Visit | Attending: Radiation Oncology | Admitting: Radiation Oncology

## 2020-08-09 ENCOUNTER — Other Ambulatory Visit: Payer: Self-pay | Admitting: Physician Assistant

## 2020-08-09 DIAGNOSIS — C3431 Malignant neoplasm of lower lobe, right bronchus or lung: Secondary | ICD-10-CM

## 2020-08-09 DIAGNOSIS — Z510 Encounter for antineoplastic radiation therapy: Secondary | ICD-10-CM | POA: Diagnosis not present

## 2020-08-10 ENCOUNTER — Encounter: Payer: Self-pay | Admitting: Emergency Medicine

## 2020-08-10 ENCOUNTER — Other Ambulatory Visit: Payer: Self-pay

## 2020-08-10 ENCOUNTER — Ambulatory Visit
Admission: RE | Admit: 2020-08-10 | Discharge: 2020-08-10 | Disposition: A | Discharge status: Home / Self Care | Payer: Medicare Other | Source: Ambulatory Visit | Attending: Radiation Oncology | Admitting: Radiation Oncology

## 2020-08-10 ENCOUNTER — Ambulatory Visit (INDEPENDENT_AMBULATORY_CARE_PROVIDER_SITE_OTHER): Payer: Medicare Other | Admitting: Emergency Medicine

## 2020-08-10 DIAGNOSIS — C3431 Malignant neoplasm of lower lobe, right bronchus or lung: Secondary | ICD-10-CM | POA: Diagnosis not present

## 2020-08-10 DIAGNOSIS — F17200 Nicotine dependence, unspecified, uncomplicated: Secondary | ICD-10-CM | POA: Diagnosis not present

## 2020-08-10 DIAGNOSIS — Z510 Encounter for antineoplastic radiation therapy: Secondary | ICD-10-CM | POA: Diagnosis not present

## 2020-08-10 DIAGNOSIS — J449 Chronic obstructive pulmonary disease, unspecified: Secondary | ICD-10-CM

## 2020-08-10 NOTE — Progress Notes (Signed)
Subjective:    Patient ID: [name], female    DOB: 1940-07-25, 80 y.o.   MRN: [id number]  HPI 80 year old smoker (77 pack years, now 0.5 pk/day) with history of CAD, hypertension, carotid artery disease with history of TIA, prior small bowel obstruction with colectomy/colostomy, remote DVT (not on anticoagulation), GERD with hiatal hernia.  Also.  History of COPD, not on any bronchodilator therapy. She underwent CT chest on 05/23/2020 and is here to review. No breathing limitations, able to shop and do housework. She is limited by back pain.   CT chest 05/23/20 reviewed by me, shows some scattered mediastinal lymph nodes that do not reach pathologic size criteria, a new spiculated right lower lobe pulmonary nodule with some central cavitation, 1.5 cm in largest diameter  ROV 08/10/20 --follow-up visit for 80 year old woman with a history of tobacco use and COPD.  Also with a history of CAD, hypertension, carotid disease, remote DVT (not on [facility]), GERD.  I met her to evaluate a spiculated right lower lobe pulmonary nodule on CT chest 05/23/2020.  She underwent navigational bronchoscopy 07/02/2020 and was diagnosed with squamous cell lung cancer, stage IIIb.  She has initiated concurrent chemoradiation. She is having a hard time with treatment - had cough, SOB earlier this week and her chemo had to be stopped. She is undergoing XRT as well.  She is very depressed about the entire situation. She quit smoking when she was diagnosed.  On Flonase, Protonix daily.  Not on any bronchodilators reliably although she has used bevespi and albuterol before with some benefit.    Review of Systems As per HPI  Past Medical History:  Diagnosis Date   Acute respiratory failure with hypoxia s/p tracheostomy 07/20/2013   Anemia    a. 7-08/2013 felt due to recent critical illness/chronic disease.   Arthritis    handds, knees & back    Barrett's esophagus    CAD (coronary artery disease)    a. Mod dz 2010  initially mgd medically. b. 12/2012 - angina s/p PTCA/DES to mid-circumflex, PTCA/DES to first OM.    Carotid artery disease ([place])    a. 60-70% bilat ICA stenosis by dopplers 05/2012.   Chronic back pain    deteriorating;DDD   Colonic obstruction due to suspected colitis; s/p colectomy/colostomy    a. See SBO.   Complication of anesthesia    hard to wake up from anesthesia    COPD (chronic obstructive pulmonary disease) (HCC)    Depression    takes Remeron and Zoloft daily   Dizziness    was on Bentyl which caused this-took off of it and no problems since   DVT of axillary vein, acute right ([place])    a. Dx 07/2013 felt due to R IJ central line that had been inserted 7/14. Not on anticoag due to GIB issues and small cerebral bleed.   GERD (gastroesophageal reflux disease)    Heart murmur    History of bronchitis    > 28yrs ago    History of colon polyps    History of hiatal hernia    History of kidney stones    was told it was stable but doesn't know for sure that she ever passed it   HTN (hypertension)    Hypertension    takes Metoprolol daily   Intracranial hemorrhage ([place])    a. 81/23/15 - per DC summary, tiny SAH vs SDH done for altered mentation, anticoag stopped including aspirin.   Joint pain  Neck pain    DDD   Other and unspecified hyperlipidemia    takes Lipitor daily   Pneumonia    hx of->15 yrs ago   Protein-calorie malnutrition, severe w/ electrolyte imbalance    a. Severe hypoalbuminemia leading to 3rd spacing including anasarca and pulm edema 07/2013.   SBO (small bowel obstruction) ([place])    a. Lysis of adhesions and ovarian cystectomy 04/2013 for sBO. b. Admitted 07/2013 for colonic obstruction due to suspected colitis, s/p partial colectomy/colostomy 08/01/13. Admission complicated by anasarca, acute resp failure requiring tracheostomy, decannulated 08/25/13. c. Recurrent SBO8/2015, NGT placed for decompression.    Stroke [facility]) early 80's   right sided weakness--TIA      Family History  Problem Relation Age of Onset   Varicose Veins Mother    Heart disease Father    Heart attack Father    AAA (abdominal aortic aneurysm) Father    Heart disease Other      Social History   Socioeconomic History   Marital status: Married    Spouse name: Not on file   Number of children: Not on file   Years of education: Not on file   Highest education level: Not on file  Occupational History   Occupation: full time  Tobacco Use   Smoking status: Former    Packs/day: 0.25    Years: 53.00    Pack years: 13.25    Types: Cigarettes   Smokeless tobacco: Never   Tobacco comments:    Stopped smoking in May 2022. ARJ 08/10/20  Vaping Use   Vaping Use: Never used  Substance and Sexual Activity   Alcohol use: No    Alcohol/week: 0.0 standard drinks   Drug use: No   Sexual activity: Not Currently    Birth control/protection: Surgical    Comment: Hysterectomy  Other Topics Concern   Not on file  Social History Narrative   Not on file   Social Determinants of Health   Financial Resource Strain: Not on file  Food Insecurity: Not on file  Transportation Needs: Not on file  Physical Activity: Not on file  Stress: Not on file  Social Connections: Not on file  Intimate Partner Violence: Not At Risk   Fear of Current or Ex-Partner: No   Emotionally Abused: No   Physically Abused: No   Sexually Abused: No    Has worked in Scientist, research (medical), as an Optometrist Has lived in [place] and [place], [place]  Former husband was in the [employer].    Allergies  Allergen Reactions   Pregabalin Swelling    Tongue swelling   Sulfonamide Derivatives Swelling    Childhood reaction     Outpatient Medications Prior to Visit  Medication Sig Dispense Refill   aspirin EC 81 MG tablet Take 2 tablets (162 mg total) by mouth in the morning. Okay to restart this medication on 07/04/2020 30 tablet 11   atorvastatin (LIPITOR) 80 MG tablet Take 80 mg by mouth every evening.      buPROPion  (WELLBUTRIN XL) 150 MG 24 hr tablet Take 1 tablet (150 mg total) by mouth daily. 90 tablet 3   CALCIUM PO Take 1 tablet by mouth in the morning.     Cholecalciferol (VITAMIN D3 PO) Take 1 tablet by mouth in the morning.     fish oil-omega-3 fatty acids 1000 MG capsule Take 1,000 mg by mouth in the morning.     fluticasone (FLONASE) 50 MCG/ACT nasal spray Place 1 spray into both nostrils daily as  needed for allergies.     folic acid (FOLVITE) 010 MCG tablet Take 800 mcg by mouth in the morning.     metoprolol succinate (TOPROL-XL) 50 MG 24 hr tablet TAKE 1 TABLET BY MOUTH  DAILY 90 tablet 3   mirtazapine (REMERON) 45 MG tablet Take 45 mg by mouth at bedtime.      nitroGLYCERIN (NITROSTAT) 0.4 MG SL tablet DISSOLVE 1 TABLET UNDER THE TONGUE EVERY 5 MINUTES AS  NEEDED FOR CHEST PAIN. MAX  OF 3 TABLETS IN 15 MINUTES. CALL 911 IF PAIN PERSISTS. 25 tablet 3   ondansetron (ZOFRAN) 4 MG tablet Take 4 mg by mouth every 8 (eight) hours as needed for nausea or vomiting.      pantoprazole (PROTONIX) 40 MG tablet Take 40 mg by mouth in the morning.     prochlorperazine (COMPAZINE) 10 MG tablet Take 1 tablet (10 mg total) by mouth every 6 (six) hours as needed for nausea or vomiting. 30 tablet 0   sertraline (ZOLOFT) 100 MG tablet Take 200 mg by mouth in the morning. 100 mg + 100 mg= 200 mg daily     sertraline (ZOLOFT) 50 MG tablet Take 50 mg by mouth in the morning. 50 mg + 100 mg=150 mg daily     No facility-administered medications prior to visit.        Objective:   Physical Exam  Today's Vitals   08/10/20 1637  BP: 112/68  Pulse: 83  Temp: 98.9 F (37.2 C)  TempSrc: Oral  SpO2: 96%  Weight: 151 lb 12.8 oz (68.9 kg)  Height: $Remove'4\' 11"'ZKASHxX$  (1.499 m)   Body mass index is 30.66 kg/m.;sm Gen: Pleasant, overwt, in no distress,  normal affect  ENT: No lesions,  mouth clear,  oropharynx clear, no postnasal drip  Neck: No JVD, no stridor  Lungs: No use of accessory muscles, distant, no crackles or  wheezing on normal respiration, no wheeze on forced expiration  Cardiovascular: RRR, heart sounds normal, no murmur or gallops, no peripheral edema  Musculoskeletal: No deformities, no cyanosis or clubbing  Neuro: alert, awake, non focal  Skin: Warm, no lesions or rash     Assessment & Plan:  COPD (chronic obstructive pulmonary disease) ([place]) She carries a diagnosis of COPD but is not on any reliable bronchodilator regimen.  She has used her husband's Breztri in the past also has albuterol.  I will try starting her on Stiolto today.  If she benefits then we will continue going forward as long as it is well covered by her insurance.  She has albuterol that she can use as needed.  TOBACCO ABUSE She has stopped smoking.  Congratulated her and support her on this.  Malignant neoplasm of bronchus of right lower lobe (HCC) Undergoing concurrent radiation and chemotherapy.  Some difficulty tolerating.  She is getting a port placed soon.  Following with Dr. Julien Nordmann and Dr. Lisbeth Renshaw.  Baltazar Apo, MD, PhD 08/10/2020, 5:16 PM [facility] [phone] or if no answer before 7:00PM call [phone] For any issues after 7:00PM please call eLink [phone]

## 2020-08-10 NOTE — Assessment & Plan Note (Addendum)
She has stopped smoking.  Congratulated her and support her on this.

## 2020-08-10 NOTE — Assessment & Plan Note (Signed)
Undergoing concurrent radiation and chemotherapy.  Some difficulty tolerating.  She is getting a port placed soon.  Following with Dr. Julien Nordmann and Dr. Lisbeth Renshaw.

## 2020-08-10 NOTE — Assessment & Plan Note (Signed)
She carries a diagnosis of COPD but is not on any reliable bronchodilator regimen.  She has used her husband's Breztri in the past also has albuterol.  I will try starting her on Stiolto today.  If she benefits then we will continue going forward as long as it is well covered by her insurance.  She has albuterol that she can use as needed.

## 2020-08-10 NOTE — Patient Instructions (Signed)
We will start Stiolto 2 puffs once daily.  Keep track of how this medication helps you.  If you benefit please call our office so that we can send a prescription to your pharmacy. Keep albuterol available to use 2 puffs if you need it for shortness of breath, chest tightness, wheezing. Continue to follow with Oncology and Radiation Oncology as planned.  Continue Flonase as you have been taking it Continue Protonix as you have been taking it Follow with Dr Lamonte Sakai in 6 months or sooner if you have any problems

## 2020-08-13 ENCOUNTER — Inpatient Hospital Stay: Payer: Medicare Other

## 2020-08-13 ENCOUNTER — Other Ambulatory Visit: Payer: Self-pay

## 2020-08-13 ENCOUNTER — Ambulatory Visit
Admission: RE | Admit: 2020-08-13 | Discharge: 2020-08-13 | Disposition: A | Discharge status: Home / Self Care | Payer: Medicare Other | Source: Ambulatory Visit | Attending: Radiation Oncology | Admitting: Radiation Oncology

## 2020-08-13 ENCOUNTER — Encounter: Payer: Self-pay | Admitting: Physician Assistant

## 2020-08-13 ENCOUNTER — Inpatient Hospital Stay (HOSPITAL_BASED_OUTPATIENT_CLINIC_OR_DEPARTMENT_OTHER): Payer: Medicare Other | Admitting: Physician Assistant

## 2020-08-13 ENCOUNTER — Other Ambulatory Visit: Payer: Self-pay | Admitting: Physician Assistant

## 2020-08-13 VITALS — BP 147/71 | HR 84 | Temp 97.4°F | Resp 19 | Ht 59.0 in | Wt 151.2 lb

## 2020-08-13 DIAGNOSIS — Z5111 Encounter for antineoplastic chemotherapy: Secondary | ICD-10-CM | POA: Diagnosis not present

## 2020-08-13 DIAGNOSIS — Z510 Encounter for antineoplastic radiation therapy: Secondary | ICD-10-CM | POA: Diagnosis not present

## 2020-08-13 DIAGNOSIS — R5383 Other fatigue: Secondary | ICD-10-CM | POA: Diagnosis not present

## 2020-08-13 DIAGNOSIS — C3431 Malignant neoplasm of lower lobe, right bronchus or lung: Secondary | ICD-10-CM

## 2020-08-13 DIAGNOSIS — R059 Cough, unspecified: Secondary | ICD-10-CM | POA: Diagnosis not present

## 2020-08-13 DIAGNOSIS — C3491 Malignant neoplasm of unspecified part of right bronchus or lung: Secondary | ICD-10-CM

## 2020-08-13 LAB — CBC WITH DIFFERENTIAL (CANCER CENTER ONLY)
Abs Immature Granulocytes: 0.02 10*3/uL (ref 0.00–0.07)
Basophils Absolute: 0 10*3/uL (ref 0.0–0.1)
Basophils Relative: 1 %
Eosinophils Absolute: 0.1 10*3/uL (ref 0.0–0.5)
Eosinophils Relative: 2 %
HCT: 30.8 % — ABNORMAL LOW (ref 36.0–46.0)
Hemoglobin: 9.7 g/dL — ABNORMAL LOW (ref 12.0–15.0)
Immature Granulocytes: 1 %
Lymphocytes Relative: 31 %
Lymphs Abs: 1.2 10*3/uL (ref 0.7–4.0)
MCH: 27.7 pg (ref 26.0–34.0)
MCHC: 31.5 g/dL (ref 30.0–36.0)
MCV: 88 fL (ref 80.0–100.0)
Monocytes Absolute: 0.5 10*3/uL (ref 0.1–1.0)
Monocytes Relative: 11 %
Neutro Abs: 2.2 10*3/uL (ref 1.7–7.7)
Neutrophils Relative %: 54 %
Platelet Count: 180 10*3/uL (ref 150–400)
RBC: 3.5 MIL/uL — ABNORMAL LOW (ref 3.87–5.11)
RDW: 15.4 % (ref 11.5–15.5)
WBC Count: 4 10*3/uL (ref 4.0–10.5)
nRBC: 0 % (ref 0.0–0.2)

## 2020-08-13 LAB — CMP (CANCER CENTER ONLY)
ALT: 14 U/L (ref 0–44)
AST: 15 U/L (ref 15–41)
Albumin: 3.4 g/dL — ABNORMAL LOW (ref 3.5–5.0)
Alkaline Phosphatase: 68 U/L (ref 38–126)
Anion gap: 7 (ref 5–15)
BUN: 14 mg/dL (ref 8–23)
CO2: 24 mmol/L (ref 22–32)
Calcium: 8.6 mg/dL — ABNORMAL LOW (ref 8.9–10.3)
Chloride: 115 mmol/L — ABNORMAL HIGH (ref 98–111)
Creatinine: 0.76 mg/dL (ref 0.44–1.00)
GFR, Estimated: >60 mL/min (ref >60)
Glucose, Bld: 94 mg/dL (ref 70–99)
Potassium: 3.6 mmol/L (ref 3.5–5.1)
Sodium: 146 mmol/L — ABNORMAL HIGH (ref 135–145)
Total Bilirubin: 0.5 mg/dL (ref 0.3–1.2)
Total Protein: 6 g/dL — ABNORMAL LOW (ref 6.5–8.1)

## 2020-08-13 MED ORDER — PALONOSETRON HCL INJECTION 0.25 MG/5ML
0.2500 mg | Freq: Once | INTRAVENOUS | Status: AC
  Administered 2020-08-13: 0.25 mg via INTRAVENOUS

## 2020-08-13 MED ORDER — PALONOSETRON HCL INJECTION 0.25 MG/5ML
INTRAVENOUS | Status: AC
  Filled 2020-08-13: qty 5

## 2020-08-13 MED ORDER — FAMOTIDINE 20 MG IN NS 100 ML IVPB: 20.0000 mg | Freq: Once | INTRAVENOUS | Status: DC

## 2020-08-13 MED ORDER — SODIUM CHLORIDE 0.9 % IV SOLN
Freq: Once | INTRAVENOUS | Status: AC
  Filled 2020-08-13: qty 250

## 2020-08-13 MED ORDER — DIPHENHYDRAMINE HCL 50 MG/ML IJ SOLN: 50.0000 mg | Freq: Once | INTRAMUSCULAR | Status: DC

## 2020-08-13 MED ORDER — SODIUM CHLORIDE 0.9 % IV SOLN
20.0000 mg | Freq: Once | INTRAVENOUS | Status: AC
  Administered 2020-08-13: 20 mg via INTRAVENOUS
  Filled 2020-08-13: qty 20

## 2020-08-13 MED ORDER — LIDOCAINE-PRILOCAINE 2.5-2.5 % EX CREA: 1.0000 "application " | TOPICAL_CREAM | CUTANEOUS | 2 refills | Status: DC | PRN

## 2020-08-13 MED ORDER — SODIUM CHLORIDE 0.9 % IV SOLN
150.0000 mg | Freq: Once | INTRAVENOUS | Status: AC
  Administered 2020-08-13: 150 mg via INTRAVENOUS
  Filled 2020-08-13: qty 15

## 2020-08-13 NOTE — Progress Notes (Signed)
Discontinue Benadryl and Pepcid premeds. Carboplatin only today. Abraxane + Carboplatin to begin next week.  Raul [place], [place], BCPS, BCOP 08/13/2020 12:20 PM

## 2020-08-13 NOTE — Progress Notes (Signed)
Per Vito Backers, NP "Just [place] today. I am going to reach out to prior auth team about getting abraxane."

## 2020-08-13 NOTE — Patient Instructions (Signed)
[facility]  Discharge Instructions: Thank you for choosing [facility] to provide your oncology and hematology care.   If you have a lab appointment with the Cancer Center, please go directly to the Cancer Center and check in at the registration area.   Wear comfortable clothing and clothing appropriate for easy access to any Portacath or PICC line.   We strive to give you quality time with your provider. You may need to reschedule your appointment if you arrive late (15 or more minutes).  Arriving late affects you and other patients whose appointments are after yours.  Also, if you miss three or more appointments without notifying the office, you may be dismissed from the clinic at the provider's discretion.      For prescription refill requests, have your pharmacy contact our office and allow 72 hours for refills to be completed.    Today you received the following chemotherapy and/or immunotherapy agents :Carboplatin   To help prevent nausea and vomiting after your treatment, we encourage you to take your nausea medication as directed.  BELOW ARE SYMPTOMS THAT SHOULD BE REPORTED IMMEDIATELY: *FEVER GREATER THAN 100.4 F (38 C) OR HIGHER *CHILLS OR SWEATING *NAUSEA AND VOMITING THAT IS NOT CONTROLLED WITH YOUR NAUSEA MEDICATION *UNUSUAL SHORTNESS OF BREATH *UNUSUAL BRUISING OR BLEEDING *URINARY PROBLEMS (pain or burning when urinating, or frequent urination) *BOWEL PROBLEMS (unusual diarrhea, constipation, pain near the anus) TENDERNESS IN MOUTH AND THROAT WITH OR WITHOUT PRESENCE OF ULCERS (sore throat, sores in mouth, or a toothache) UNUSUAL RASH, SWELLING OR PAIN  UNUSUAL VAGINAL DISCHARGE OR ITCHING   Items with * indicate a potential emergency and should be followed up as soon as possible or go to the Emergency Department if any problems should occur.  Please show the CHEMOTHERAPY ALERT CARD or IMMUNOTHERAPY ALERT CARD at check-in to  the Emergency Department and triage nurse.  Should you have questions after your visit or need to cancel or reschedule your appointment, please contact [facility]  Dept: [phone]  and follow the prompts.  Office hours are 8:00 a.m. to 4:30 p.m. Monday - Friday. Please note that voicemails left after 4:00 p.m. may not be returned until the following business day.  We are closed weekends and major holidays. You have access to a nurse at all times for urgent questions. Please call the main number to the clinic Dept: [phone] and follow the prompts.   For any non-urgent questions, you may also contact your provider using MyChart. We now offer e-Visits for anyone 18 and older to request care online for non-urgent symptoms. For details visit [email or web address].   Also download the MyChart app! Go to the app store, search "MyChart", open the app, select [facility], and log in with your MyChart username and password.  Due to Covid, a mask is required upon entering the hospital/clinic. If you do not have a mask, one will be given to you upon arrival. For doctor visits, patients may have 1 support person aged 18 or older with them. For treatment visits, patients cannot have anyone with them due to current Covid guidelines and our immunocompromised population.   

## 2020-08-14 ENCOUNTER — Ambulatory Visit
Admission: RE | Admit: 2020-08-14 | Discharge: 2020-08-14 | Disposition: A | Discharge status: Home / Self Care | Payer: Medicare Other | Source: Ambulatory Visit | Attending: Radiation Oncology | Admitting: Radiation Oncology

## 2020-08-14 ENCOUNTER — Other Ambulatory Visit: Payer: Self-pay

## 2020-08-14 DIAGNOSIS — D638 Anemia in other chronic diseases classified elsewhere: Secondary | ICD-10-CM | POA: Diagnosis not present

## 2020-08-14 DIAGNOSIS — Z8249 Family history of ischemic heart disease and other diseases of the circulatory system: Secondary | ICD-10-CM | POA: Diagnosis not present

## 2020-08-14 DIAGNOSIS — R1013 Epigastric pain: Secondary | ICD-10-CM | POA: Diagnosis not present

## 2020-08-14 DIAGNOSIS — Z87442 Personal history of urinary calculi: Secondary | ICD-10-CM | POA: Diagnosis not present

## 2020-08-14 DIAGNOSIS — F32A Depression, unspecified: Secondary | ICD-10-CM | POA: Diagnosis not present

## 2020-08-14 DIAGNOSIS — K922 Gastrointestinal hemorrhage, unspecified: Secondary | ICD-10-CM | POA: Diagnosis not present

## 2020-08-14 DIAGNOSIS — I509 Heart failure, unspecified: Secondary | ICD-10-CM | POA: Diagnosis not present

## 2020-08-14 DIAGNOSIS — Z20822 Contact with and (suspected) exposure to covid-19: Secondary | ICD-10-CM | POA: Diagnosis not present

## 2020-08-14 DIAGNOSIS — Z79899 Other long term (current) drug therapy: Secondary | ICD-10-CM | POA: Diagnosis not present

## 2020-08-14 DIAGNOSIS — C3431 Malignant neoplasm of lower lobe, right bronchus or lung: Secondary | ICD-10-CM | POA: Diagnosis not present

## 2020-08-14 DIAGNOSIS — Z888 Allergy status to other drugs, medicaments and biological substances status: Secondary | ICD-10-CM | POA: Diagnosis not present

## 2020-08-14 DIAGNOSIS — Z8673 Personal history of transient ischemic attack (TIA), and cerebral infarction without residual deficits: Secondary | ICD-10-CM | POA: Diagnosis not present

## 2020-08-14 DIAGNOSIS — Z86718 Personal history of other venous thrombosis and embolism: Secondary | ICD-10-CM | POA: Diagnosis not present

## 2020-08-14 DIAGNOSIS — I471 Supraventricular tachycardia: Secondary | ICD-10-CM | POA: Diagnosis not present

## 2020-08-14 DIAGNOSIS — Z955 Presence of coronary angioplasty implant and graft: Secondary | ICD-10-CM | POA: Diagnosis not present

## 2020-08-14 DIAGNOSIS — I2510 Atherosclerotic heart disease of native coronary artery without angina pectoris: Secondary | ICD-10-CM | POA: Diagnosis not present

## 2020-08-14 DIAGNOSIS — D649 Anemia, unspecified: Secondary | ICD-10-CM | POA: Diagnosis not present

## 2020-08-14 DIAGNOSIS — I5033 Acute on chronic diastolic (congestive) heart failure: Secondary | ICD-10-CM | POA: Diagnosis not present

## 2020-08-14 DIAGNOSIS — K219 Gastro-esophageal reflux disease without esophagitis: Secondary | ICD-10-CM | POA: Diagnosis not present

## 2020-08-14 DIAGNOSIS — J90 Pleural effusion, not elsewhere classified: Secondary | ICD-10-CM | POA: Diagnosis not present

## 2020-08-14 DIAGNOSIS — R079 Chest pain, unspecified: Secondary | ICD-10-CM | POA: Diagnosis not present

## 2020-08-14 DIAGNOSIS — E876 Hypokalemia: Secondary | ICD-10-CM | POA: Diagnosis not present

## 2020-08-14 DIAGNOSIS — Z86010 Personal history of colonic polyps: Secondary | ICD-10-CM | POA: Diagnosis not present

## 2020-08-14 DIAGNOSIS — R109 Unspecified abdominal pain: Secondary | ICD-10-CM | POA: Diagnosis not present

## 2020-08-14 DIAGNOSIS — Z9049 Acquired absence of other specified parts of digestive tract: Secondary | ICD-10-CM | POA: Diagnosis not present

## 2020-08-14 DIAGNOSIS — Z90710 Acquired absence of both cervix and uterus: Secondary | ICD-10-CM | POA: Diagnosis not present

## 2020-08-14 DIAGNOSIS — I110 Hypertensive heart disease with heart failure: Secondary | ICD-10-CM | POA: Diagnosis not present

## 2020-08-14 DIAGNOSIS — R197 Diarrhea, unspecified: Secondary | ICD-10-CM | POA: Diagnosis not present

## 2020-08-14 DIAGNOSIS — Z7982 Long term (current) use of aspirin: Secondary | ICD-10-CM | POA: Diagnosis not present

## 2020-08-14 DIAGNOSIS — Z882 Allergy status to sulfonamides status: Secondary | ICD-10-CM | POA: Diagnosis not present

## 2020-08-14 DIAGNOSIS — R0602 Shortness of breath: Secondary | ICD-10-CM | POA: Diagnosis not present

## 2020-08-14 DIAGNOSIS — I517 Cardiomegaly: Secondary | ICD-10-CM | POA: Diagnosis not present

## 2020-08-14 DIAGNOSIS — R195 Other fecal abnormalities: Secondary | ICD-10-CM | POA: Diagnosis not present

## 2020-08-15 ENCOUNTER — Other Ambulatory Visit: Payer: Self-pay

## 2020-08-15 ENCOUNTER — Encounter (HOSPITAL_COMMUNITY): Payer: Self-pay

## 2020-08-15 ENCOUNTER — Ambulatory Visit
Admission: RE | Admit: 2020-08-15 | Discharge: 2020-08-15 | Disposition: A | Discharge status: Home / Self Care | Payer: Medicare Other | Source: Ambulatory Visit | Attending: Radiation Oncology | Admitting: Radiation Oncology

## 2020-08-15 ENCOUNTER — Inpatient Hospital Stay (HOSPITAL_COMMUNITY)
Admission: EM | Admit: 2020-08-15 | Discharge: 2020-08-17 | DRG: 308 | Disposition: A | Discharge status: Home / Self Care | Payer: Medicare Other | Attending: Internal Medicine | Admitting: Internal Medicine

## 2020-08-15 DIAGNOSIS — I471 Supraventricular tachycardia: Principal | ICD-10-CM | POA: Diagnosis present

## 2020-08-15 DIAGNOSIS — I5033 Acute on chronic diastolic (congestive) heart failure: Secondary | ICD-10-CM | POA: Diagnosis present

## 2020-08-15 DIAGNOSIS — Z8249 Family history of ischemic heart disease and other diseases of the circulatory system: Secondary | ICD-10-CM

## 2020-08-15 DIAGNOSIS — R197 Diarrhea, unspecified: Secondary | ICD-10-CM | POA: Diagnosis present

## 2020-08-15 DIAGNOSIS — K219 Gastro-esophageal reflux disease without esophagitis: Secondary | ICD-10-CM | POA: Diagnosis present

## 2020-08-15 DIAGNOSIS — D638 Anemia in other chronic diseases classified elsewhere: Secondary | ICD-10-CM | POA: Diagnosis present

## 2020-08-15 DIAGNOSIS — R195 Other fecal abnormalities: Secondary | ICD-10-CM | POA: Diagnosis present

## 2020-08-15 DIAGNOSIS — D649 Anemia, unspecified: Secondary | ICD-10-CM | POA: Diagnosis present

## 2020-08-15 DIAGNOSIS — I110 Hypertensive heart disease with heart failure: Secondary | ICD-10-CM | POA: Diagnosis present

## 2020-08-15 DIAGNOSIS — Z888 Allergy status to other drugs, medicaments and biological substances status: Secondary | ICD-10-CM

## 2020-08-15 DIAGNOSIS — Z87442 Personal history of urinary calculi: Secondary | ICD-10-CM

## 2020-08-15 DIAGNOSIS — Z90710 Acquired absence of both cervix and uterus: Secondary | ICD-10-CM

## 2020-08-15 DIAGNOSIS — Z8673 Personal history of transient ischemic attack (TIA), and cerebral infarction without residual deficits: Secondary | ICD-10-CM

## 2020-08-15 DIAGNOSIS — Z79899 Other long term (current) drug therapy: Secondary | ICD-10-CM

## 2020-08-15 DIAGNOSIS — C3431 Malignant neoplasm of lower lobe, right bronchus or lung: Secondary | ICD-10-CM | POA: Diagnosis present

## 2020-08-15 DIAGNOSIS — J449 Chronic obstructive pulmonary disease, unspecified: Secondary | ICD-10-CM | POA: Diagnosis present

## 2020-08-15 DIAGNOSIS — Z20822 Contact with and (suspected) exposure to covid-19: Secondary | ICD-10-CM | POA: Diagnosis present

## 2020-08-15 DIAGNOSIS — Z86718 Personal history of other venous thrombosis and embolism: Secondary | ICD-10-CM

## 2020-08-15 DIAGNOSIS — Z9049 Acquired absence of other specified parts of digestive tract: Secondary | ICD-10-CM

## 2020-08-15 DIAGNOSIS — E7849 Other hyperlipidemia: Secondary | ICD-10-CM | POA: Diagnosis present

## 2020-08-15 DIAGNOSIS — E876 Hypokalemia: Secondary | ICD-10-CM

## 2020-08-15 DIAGNOSIS — I252 Old myocardial infarction: Secondary | ICD-10-CM

## 2020-08-15 DIAGNOSIS — F32A Depression, unspecified: Secondary | ICD-10-CM | POA: Diagnosis present

## 2020-08-15 DIAGNOSIS — Z955 Presence of coronary angioplasty implant and graft: Secondary | ICD-10-CM

## 2020-08-15 DIAGNOSIS — Z7982 Long term (current) use of aspirin: Secondary | ICD-10-CM

## 2020-08-15 DIAGNOSIS — Z87891 Personal history of nicotine dependence: Secondary | ICD-10-CM

## 2020-08-15 DIAGNOSIS — I2510 Atherosclerotic heart disease of native coronary artery without angina pectoris: Secondary | ICD-10-CM | POA: Diagnosis present

## 2020-08-15 DIAGNOSIS — K922 Gastrointestinal hemorrhage, unspecified: Secondary | ICD-10-CM | POA: Diagnosis present

## 2020-08-15 DIAGNOSIS — Z86010 Personal history of colonic polyps: Secondary | ICD-10-CM

## 2020-08-15 DIAGNOSIS — R1013 Epigastric pain: Secondary | ICD-10-CM

## 2020-08-15 DIAGNOSIS — Z882 Allergy status to sulfonamides status: Secondary | ICD-10-CM

## 2020-08-15 LAB — CBC
HCT: 28.5 % — ABNORMAL LOW (ref 36.0–46.0)
Hemoglobin: 8.9 g/dL — ABNORMAL LOW (ref 12.0–15.0)
MCH: 28 pg (ref 26.0–34.0)
MCHC: 31.2 g/dL (ref 30.0–36.0)
MCV: 89.6 fL (ref 80.0–100.0)
Platelets: 172 10*3/uL (ref 150–400)
RBC: 3.18 MIL/uL — ABNORMAL LOW (ref 3.87–5.11)
RDW: 16.4 % — ABNORMAL HIGH (ref 11.5–15.5)
WBC: 4.7 10*3/uL (ref 4.0–10.5)
nRBC: 0 % (ref 0.0–0.2)

## 2020-08-15 LAB — COMPREHENSIVE METABOLIC PANEL
ALT: 16 U/L (ref 0–44)
AST: 14 U/L — ABNORMAL LOW (ref 15–41)
Albumin: 3.4 g/dL — ABNORMAL LOW (ref 3.5–5.0)
Alkaline Phosphatase: 56 U/L (ref 38–126)
Anion gap: 12 (ref 5–15)
BUN: 19 mg/dL (ref 8–23)
CO2: 23 mmol/L (ref 22–32)
Calcium: 8.8 mg/dL — ABNORMAL LOW (ref 8.9–10.3)
Chloride: 112 mmol/L — ABNORMAL HIGH (ref 98–111)
Creatinine, Ser: 0.76 mg/dL (ref 0.44–1.00)
GFR, Estimated: >60 mL/min (ref >60)
Glucose, Bld: 132 mg/dL — ABNORMAL HIGH (ref 70–99)
Potassium: 3.3 mmol/L — ABNORMAL LOW (ref 3.5–5.1)
Sodium: 147 mmol/L — ABNORMAL HIGH (ref 135–145)
Total Bilirubin: 0.5 mg/dL (ref 0.3–1.2)
Total Protein: 5.7 g/dL — ABNORMAL LOW (ref 6.5–8.1)

## 2020-08-15 LAB — TROPONIN I (HIGH SENSITIVITY): Troponin I (High Sensitivity): 7 ng/L (ref <18)

## 2020-08-15 LAB — ACID FAST CULTURE WITH REFLEXED SENSITIVITIES (MYCOBACTERIA): Acid Fast Culture: NEGATIVE

## 2020-08-15 LAB — MAGNESIUM: Magnesium: 1.5 mg/dL — ABNORMAL LOW (ref 1.7–2.4)

## 2020-08-15 LAB — LIPASE, BLOOD: Lipase: 24 U/L (ref 11–51)

## 2020-08-15 MED ORDER — LIDOCAINE VISCOUS HCL 2 % MT SOLN
15.0000 mL | Freq: Once | OROMUCOSAL | Status: AC
  Administered 2020-08-15: 15 mL via ORAL
  Filled 2020-08-15: qty 15

## 2020-08-15 MED ORDER — SODIUM CHLORIDE 0.9 % IV BOLUS
500.0000 mL | Freq: Once | INTRAVENOUS | Status: AC
Start: 2020-08-15 — End: 2020-08-16
  Administered 2020-08-15: 500 mL via INTRAVENOUS

## 2020-08-15 MED ORDER — ONDANSETRON HCL 4 MG/2ML IJ SOLN
4.0000 mg | Freq: Once | INTRAMUSCULAR | Status: DC | PRN
  Filled 2020-08-15: qty 2

## 2020-08-15 MED ORDER — FAMOTIDINE IN NACL 20-0.9 MG/50ML-% IV SOLN
20.0000 mg | Freq: Once | INTRAVENOUS | Status: AC
  Administered 2020-08-15: 20 mg via INTRAVENOUS
  Filled 2020-08-15: qty 50

## 2020-08-15 MED ORDER — ADENOSINE 6 MG/2ML IV SOLN
6.0000 mg | Freq: Once | INTRAVENOUS | Status: AC
  Administered 2020-08-15: 6 mg via INTRAVENOUS
  Filled 2020-08-15: qty 2

## 2020-08-15 MED ORDER — ALUM & MAG HYDROXIDE-SIMETH 200-200-20 MG/5ML PO SUSP
30.0000 mL | Freq: Once | ORAL | Status: AC
  Administered 2020-08-15: 30 mL via ORAL
  Filled 2020-08-15: qty 30

## 2020-08-15 NOTE — ED Provider Notes (Signed)
I was the supervising physician during this patient's ER visit.  I directly supervised history taking, exam and medical decision making.  Briefly this is an 80 year old female who presented with epigastric abdominal pain.  Currently undergoing radiation for cancer.  Concern for tachycardia in the setting of cancer in regards to possible pulmonary embolism.  EKG shows SVT, initial troponin is negative.  Plan for CT scanning to rule out PE and abdominal pathology, will treat as ingestion.  Patient got 6 mg of adenosine and converted to normal sinus rhythm with frequent PACs.  Plan for CT imaging, repeat troponin and possible admission.  Patient signed out to night team.     Ultrasound ED Peripheral IV (Provider)  Date/Time: 08/15/2020 11:53 PM Performed by: Lorelle Gibbs, DO Authorized by: Lorelle Gibbs, DO   Procedure details:    Indications: multiple failed IV attempts and poor IV access     Skin Prep: isopropyl alcohol     Location:  Left AC   Angiocath:  20 G   Bedside Ultrasound Guided: Yes     Images: not archived     Patient tolerated procedure without complications: Yes     Dressing applied: Yes   .Cardioversion  Date/Time: 08/15/2020 11:58 PM Performed by: Lorelle Gibbs, DO Authorized by: Lorelle Gibbs, DO   Consent:    Consent obtained:  Verbal   Consent given by:  Patient   Risks discussed:  Induced arrhythmia and pain   Alternatives discussed:  No treatment Pre-procedure details:    Cardioversion basis:  Emergent   Rhythm:  Supraventricular tachycardia   Electrode placement:  Anterior-posterior Attempt one:    Cardioversion mode attempt one: Adenosine. Post-procedure details:    Patient status:  Awake   Patient tolerance of procedure:  Tolerated with difficulty Comments:     6 mg of Adenosine     Lorelle Gibbs, DO 08/15/20 2359

## 2020-08-15 NOTE — ED Provider Notes (Signed)
80 year old female  received   "ROSIE TORREZ is a 80 y.o. female pending fluids and CT. Per her HPI:    Kayli Beal is an 80 yo female with a PMH of non-small cell lung cancer and previous MI who presents today with upper abdominal pain and shortness of breath. She states that she woke up last night with 10/10 upper abdominal pain that feels stabbing in nature, "like something is inside of her chest". She notes that the pain has been constant since onset. She does endorse some radiating discomfort up her right neck, but notes it is not necessarily painful. She endorses shortness of breath that also began at the time of the abdominal pain. She reports that this pain feels different from her previous MI, which occurred a few years ago. She has been able to tolerate foods, and denies dysphagia. She denies nausea and vomiting as well. She has been undergoing chemotherapy and radiation x 3 weeks for her lung cancer. She underwent radiation today and was told by providers that the pain was likely "indigestion." She has no prior history of GERD."  Physical Exam  BP (!) 112/50   Pulse (!) 116   Temp 98.5 F (36.9 C) (Oral)   Resp (!) 30   Ht 4\' 11"  (1.499 m)   Wt 68.9 kg   SpO2 97%   BMI 30.68 kg/m   Physical Exam Vitals and nursing note reviewed.  Constitutional:      General: She is not in acute distress.    Appearance: She is well-developed.  HENT:     Head: Normocephalic and atraumatic.  Cardiovascular:     Rate and Rhythm: Tachycardia present.  Abdominal:     Tenderness: There is abdominal tenderness.     Comments: Tender to palpation in the epigastric region.  Musculoskeletal:        General: Normal range of motion.     Cervical back: Normal range of motion.  Neurological:     Mental Status: She is alert and oriented to person, place, and time.    ED Course/Procedures   Clinical Course as of 08/16/20 0624  Wed Aug 15, 2020  2238 Creatinine: 0.76 [LM]  Thu Aug 16, 5757  7427 80 year old female with complaint of epigastric abdominal pain which radiates up into her chest, states does not feel like her prior heart attack.  Onset of symptoms last night, constant since that time.  Denies shortness of breath or difficulty breathing, denies palpitations, nausea, vomiting.  States that she has had a few loose stools since her chemotherapy treatment for her non-small cell lung cancer 2 days ago. On exam, patient is tachypneic, appears uncomfortable, has generalized abdominal tenderness.  Heart rate found to be in the 1 40-1 50 range with blood pressure 101/70.  Patient is tachypneic to the 30s. Vagal maneuver attempted with improvement however heart rate were immediately returns to 150s. Dr. Dina Rich, ER attending, called to bedside to evaluate patient.  Decision was made to attempt cardioversion with adenosine.  Patient tolerated procedure well, see Dr.Horton's note. HR improved to the 90s. Plan is for CT chest to evaluate for PE, CT abdomen pelvis for abdominal pain. Review of labs, found to have a hemoglobin of 8.9 today, down from 10.62 days ago.  Will obtain Hemoccult, patient denies dark or bloody stools. CMP with potassium of 3.3, will order oral potassium.  Magnesium 1.5, IV potassium ordered.  Initial troponin is 7, lipase is 24. Care signed out pending further imaging  and disposition. [LM]    Clinical Course User Index [LM] Roque Lias    Procedures  MDM  80 year old female received in signout from [place] pending fluid bolus and CTA.  She has a history of non-small cell lung cancer and presented with shortness of breath and upper abdominal pain.  She was also noted to have a downtrending hemoglobin.  Please see her note for further work-up and medical decision making.  Patient developed SVT in the ED and was given adenosine.  Tachycardia resolved, but recurred 2 additional times.  Discussed the patient with Dr. Betsey Holiday, attending physician.    Hemoglobin is down almost 2 g in the last 9 days.  She is also Hemoccult positive.  CT with a 2 cm cavitary lesion with air-fluid level in the right lower lobe that may represent a fungal infection, abscess, or less likely malignancy.  Notably, this is around the location where her cancer is located.  Given recurrent episodes of SVT and downtrending hemoglobin, will admit the patient for to the hospital service.  Dr. Myna Hidalgo has accepted the patient for admission.    Joanne Gavel, PA-C 08/16/20 2787    Orpah Greek, MD 08/16/20 [phone]

## 2020-08-15 NOTE — ED Triage Notes (Addendum)
Patient is complaining of upper abdominal pain. Patient is getting radiation. Pain 10/10. Patient has lung cancer per patient.

## 2020-08-15 NOTE — ED Provider Notes (Signed)
[name]   CSN: [id number] Arrival date & time: 08/15/20  2034     History Chief Complaint  Patient presents with   Chest Pain    [name] is a 80 y.o. female.  [name] is an 80 yo female with a PMH of non-small cell lung cancer and previous MI who presents today with upper abdominal pain and shortness of breath. She states that she woke up last night with 10/10 upper abdominal pain that feels stabbing in nature, "like something is inside of her chest". She notes that the pain has been constant since onset. She does endorse some radiating discomfort up her right neck, but notes it is not necessarily painful. She endorses shortness of breath that also began at the time of the abdominal pain. She reports that this pain feels different from her previous MI, which occurred a few years ago. She has been able to tolerate foods, and denies dysphagia. She denies nausea and vomiting as well. She has been undergoing chemotherapy and radiation x 3 weeks for her lung cancer. She underwent radiation today and was told by providers that the pain was likely "indigestion." She has no prior history of GERD.       Past Medical History:  Diagnosis Date   Acute respiratory failure with hypoxia s/p tracheostomy 07/20/2013   Anemia    a. 7-08/2013 felt due to recent critical illness/chronic disease.   Arthritis    handds, knees & back    Barrett's esophagus    CAD (coronary artery disease)    a. Mod dz 2010 initially mgd medically. b. 12/2012 - angina s/p PTCA/DES to mid-circumflex, PTCA/DES to first OM.    Carotid artery disease ([place])    a. 60-70% bilat ICA stenosis by dopplers 05/2012.   Chronic back pain    deteriorating;DDD   Colonic obstruction due to suspected colitis; s/p colectomy/colostomy    a. See SBO.   Complication of anesthesia    hard to wake up from anesthesia    COPD (chronic obstructive pulmonary disease) (HCC)     Depression    takes Remeron and Zoloft daily   Dizziness    was on Bentyl which caused this-took off of it and no problems since   DVT of axillary vein, acute right ([place])    a. Dx 07/2013 felt due to R IJ central line that had been inserted 7/14. Not on anticoag due to GIB issues and small cerebral bleed.   GERD (gastroesophageal reflux disease)    Heart murmur    History of bronchitis    > 90yrs ago    History of colon polyps    History of hiatal hernia    History of kidney stones    was told it was stable but doesn't know for sure that she ever passed it   HTN (hypertension)    Hypertension    takes Metoprolol daily   Intracranial hemorrhage ([place])    a. 08/11/13 - per DC summary, tiny SAH vs SDH done for altered mentation, anticoag stopped including aspirin.   Joint pain    Neck pain    DDD   Other and unspecified hyperlipidemia    takes Lipitor daily   Pneumonia    hx of->15 yrs ago   Protein-calorie malnutrition, severe w/ electrolyte imbalance    a. Severe hypoalbuminemia leading to 3rd spacing including anasarca and pulm edema 07/2013.   SBO (small bowel obstruction) ([place])    a.  Lysis of adhesions and ovarian cystectomy 04/2013 for sBO. b. Admitted 07/2013 for colonic obstruction due to suspected colitis, s/p partial colectomy/colostomy 08/01/13. Admission complicated by anasarca, acute resp failure requiring tracheostomy, decannulated 08/25/13. c. Recurrent SBO8/2015, NGT placed for decompression.    Stroke [facility]) early 80's   right sided weakness--TIA    Patient Active Problem List   Diagnosis Date Noted   Malignant neoplasm of bronchus of right lower lobe ([place]) 07/10/2020   Encounter for antineoplastic chemotherapy 07/10/2020   Pulmonary nodule 1 cm or greater in diameter 06/21/2020   COPD (chronic obstructive pulmonary disease) ([place]) 06/21/2020   Degenerative disc disease, lumbar 06/19/2020   Degenerative disc disease, cervical 06/19/2020   Type 2 diabetes mellitus with  complication, without long-term current use of insulin ([place]) 01/11/2019   NSTEMI (non-ST elevated myocardial infarction) ([place]) 01/08/2019   Carotid stenosis 10/23/2014   S/P carotid endarterectomy 10/23/2014   Cerebrovascular accident (CVA) due to embolism of right middle cerebral artery ([place]) 10/23/2014   Subacromial impingement of left shoulder 10/02/2014   Adhesive capsulitis of left shoulder 10/02/2014   Other impaction of intestine ([place])    Left hemiparesis ([place]) 08/13/2014   Acute right arterial ischemic stroke, middle cerebral artery (MCA) ([place]) 08/11/2014   Carotid artery stenosis, symptomatic 08/08/2014   Carotid disease, bilateral ([place]) 06/02/2014   Coronary artery disease due to lipid rich plaque 06/02/2014   Essential hypertension 06/02/2014   Hyperlipidemia 06/02/2014   Acute respiratory failure ([place])    S/P colostomy takedown 01/24/2014   Chronic diastolic CHF (congestive heart failure), NYHA class 2 ([place]) 11/10/2013   Atypical chest pain 10/20/2013   Malnutrition of moderate degree ([place]) 09/18/2013   SBO (small bowel obstruction) ([place]) 09/17/2013   H/O tracheostomy 09/17/2013   Acute respiratory failure with hypoxia s/p tracheostomy 08/19/2013   Physical deconditioning 08/19/2013   HTN (hypertension) 08/19/2013   Dehydration with hypernatremia 08/19/2013   DVT of axillary vein, acute right ([place]) 08/19/2013   Anemia 08/19/2013   Anasarca 08/14/2013   Hypokalemia 07/31/2013   Hypomagnesemia 07/31/2013   Protein-calorie malnutrition, severe w/ electrolyte imbalance 07/31/2013   Colonic obstruction due to suspected colitis; s/p colectomy/colostomy 07/29/2013   CAD (coronary artery disease)    Depression    Barrett's esophagus    Arthritis    Other and unspecified angina pectoris 12/20/2012   Chest pain 12/14/2012   TOBACCO ABUSE 11/08/2009   Hyperlipidemia, mixed 04/17/2008    Past Surgical History:  Procedure Laterality Date   ABDOMINAL HYSTERECTOMY      APPENDECTOMY     BOWEL RESECTION N/A 08/01/2013   Procedure: SMALL BOWEL RESECTION;  Surgeon: Harl Bowie, MD;  Location: [place];  Service: General;  Laterality: N/A;   BRONCHIAL BIOPSY  07/02/2020   Procedure: BRONCHIAL BIOPSIES;  Surgeon: Collene Gobble, MD;  Location: [facility] ENDOSCOPY;  Service: Pulmonary;;   BRONCHIAL BRUSHINGS  07/02/2020   Procedure: BRONCHIAL BRUSHINGS;  Surgeon: Collene Gobble, MD;  Location: [facility] ENDOSCOPY;  Service: Pulmonary;;   BRONCHIAL NEEDLE ASPIRATION BIOPSY  07/02/2020   Procedure: BRONCHIAL NEEDLE ASPIRATION BIOPSIES;  Surgeon: Collene Gobble, MD;  Location: [facility] ENDOSCOPY;  Service: Pulmonary;;   BRONCHIAL WASHINGS  07/02/2020   Procedure: BRONCHIAL WASHINGS;  Surgeon: Collene Gobble, MD;  Location: [place] ENDOSCOPY;  Service: Pulmonary;;   cataract surgery Bilateral    CHOLECYSTECTOMY     COLON RESECTION     COLON SURGERY     COLOSTOMY N/A 08/01/2013   Procedure: COLOSTOMY;  Surgeon: Nathaneil Canary  Lynann Beaver, MD;  Location: [place];  Service: General;  Laterality: N/A;   COLOSTOMY TAKEDOWN  01/24/2014   dr blackman   COLOSTOMY TAKEDOWN N/A 01/24/2014   Procedure: COLOSTOMY TAKEDOWN;  Surgeon: Coralie Keens, MD;  Location: [place];  Service: General;  Laterality: N/A;   CORONARY ANGIOPLASTY WITH STENT PLACEMENT  12/20/2012   STENT TO OM         DR COOPER   ENDARTERECTOMY Right 08/08/2014   Procedure: RIGHT CAROTID ENDARTERECTOMY WITH HEMASHIELD PATCH ANGIOPLASTY;  Surgeon: Elam Dutch, MD;  Location: [place];  Service: Vascular;  Laterality: Right;   FIDUCIAL MARKER PLACEMENT  07/02/2020   Procedure: FIDUCIAL MARKER PLACEMENT;  Surgeon: Collene Gobble, MD;  Location: [place] ENDOSCOPY;  Service: Pulmonary;;   FOOT SURGERY Left    HAND SURGERY Bilateral    for ganglion cysts   HEMOSTASIS CONTROL  07/02/2020   Procedure: HEMOSTASIS CONTROL;  Surgeon: Collene Gobble, MD;  Location: [place] ENDOSCOPY;  Service: Pulmonary;;  ice saline and epi   LAPAROTOMY N/A 08/01/2013    Procedure: EXPLORATORY LAPAROTOMY;  Surgeon: Harl Bowie, MD;  Location: [place];  Service: General;  Laterality: N/A;   LEFT HEART CATH AND CORONARY ANGIOGRAPHY N/A 01/10/2019   Procedure: LEFT HEART CATH AND CORONARY ANGIOGRAPHY;  Surgeon: Troy Sine, MD;  Location: [facility] CV LAB;  Service: Cardiovascular;  Laterality: N/A;   LEFT HEART CATHETERIZATION WITH CORONARY ANGIOGRAM N/A 12/20/2012   Procedure: LEFT HEART CATHETERIZATION WITH CORONARY ANGIOGRAM;  Surgeon: Blane Ohara, MD;  Location: [facility] CATH LAB;  Service: Cardiovascular;  Laterality: N/A;   PARTIAL COLECTOMY N/A 08/01/2013   Procedure: PARTIAL COLECTOMY;  Surgeon: Harl Bowie, MD;  Location: [place];  Service: General;  Laterality: N/A;   ROTATOR CUFF REPAIR Left    TONSILLECTOMY     TRACHEOSTOMY     feinstein   VIDEO BRONCHOSCOPY WITH ENDOBRONCHIAL NAVIGATION N/A 07/02/2020   Procedure: VIDEO BRONCHOSCOPY WITH ENDOBRONCHIAL NAVIGATION;  Surgeon: Collene Gobble, MD;  Location: [place] ENDOSCOPY;  Service: Pulmonary;  Laterality: N/A;     OB History   No obstetric history on file.     Family History  Problem Relation Age of Onset   Varicose Veins Mother    Heart disease Father    Heart attack Father    AAA (abdominal aortic aneurysm) Father    Heart disease Other     Social History   Tobacco Use   Smoking status: Former    Packs/day: 0.25    Years: 53.00    Pack years: 13.25    Types: Cigarettes   Smokeless tobacco: Never   Tobacco comments:    Stopped smoking in May 2022. ARJ 08/10/20  Vaping Use   Vaping Use: Never used  Substance Use Topics   Alcohol use: No    Alcohol/week: 0.0 standard drinks   Drug use: No    Home Medications Prior to Admission medications   Medication Sig Start Date End Date Taking? Authorizing Provider  aspirin EC 81 MG tablet Take 2 tablets (162 mg total) by mouth in the morning. Okay to restart this medication on 07/04/2020 07/02/20   Collene Gobble, MD   atorvastatin (LIPITOR) 80 MG tablet Take 80 mg by mouth every evening.     [provider]  buPROPion (WELLBUTRIN XL) 150 MG 24 hr tablet Take 1 tablet (150 mg total) by mouth daily. 03/02/15   Dorothy Spark, MD  CALCIUM PO Take 1 tablet  by mouth in the morning.    [provider]  Cholecalciferol (VITAMIN D3 PO) Take 1 tablet by mouth in the morning.    [provider]  fish oil-omega-3 fatty acids 1000 MG capsule Take 1,000 mg by mouth in the morning.    [provider]  fluticasone (FLONASE) 50 MCG/ACT nasal spray Place 1 spray into both nostrils daily as needed for allergies. 11/18/18   [provider]  folic acid (FOLVITE) 702 MCG tablet Take 800 mcg by mouth in the morning.    [provider]  lidocaine-prilocaine (EMLA) cream Apply 1 application topically as needed. 08/13/20   Heilingoetter, Cassandra L, PA-C  metoprolol succinate (TOPROL-XL) 50 MG 24 hr tablet TAKE 1 TABLET BY MOUTH  DAILY 12/21/19   Camnitz, Ocie Doyne, MD  mirtazapine (REMERON) 45 MG tablet Take 45 mg by mouth at bedtime.  06/29/18   [provider]  nitroGLYCERIN (NITROSTAT) 0.4 MG SL tablet DISSOLVE 1 TABLET UNDER THE TONGUE EVERY 5 MINUTES AS  NEEDED FOR CHEST PAIN. MAX  OF 3 TABLETS IN 15 MINUTES. CALL 911 IF PAIN PERSISTS. 06/04/20   Nahser, Wonda Cheng, MD  ondansetron (ZOFRAN) 4 MG tablet Take 4 mg by mouth every 8 (eight) hours as needed for nausea or vomiting.     [provider]  pantoprazole (PROTONIX) 40 MG tablet Take 40 mg by mouth in the morning.    [provider]  prochlorperazine (COMPAZINE) 10 MG tablet Take 1 tablet (10 mg total) by mouth every 6 (six) hours as needed for nausea or vomiting. 07/27/20   Curt Bears, MD  sertraline (ZOLOFT) 100 MG tablet Take 200 mg by mouth in the morning. 100 mg + 100 mg= 200 mg daily 06/27/14   [provider]  sertraline (ZOLOFT) 50 MG tablet Take 50 mg by mouth in the morning. 50  mg + 100 mg=150 mg daily    [provider]    Allergies    Pregabalin and Sulfonamide derivatives  Review of Systems   Review of Systems  Constitutional:  Negative for fever.  Respiratory:  Negative for chest tightness and shortness of breath.   Cardiovascular:  Negative for chest pain.  Gastrointestinal:  Positive for abdominal pain. Negative for constipation, diarrhea, nausea and vomiting.  Genitourinary:  Negative for dysuria.  Musculoskeletal:  Negative for arthralgias and myalgias.  Skin:  Negative for rash and wound.  Allergic/Immunologic: Positive for immunocompromised state.  Neurological:  Negative for dizziness, weakness and headaches.  Hematological:  Negative for adenopathy.  Psychiatric/Behavioral:  Negative for confusion.   All other systems reviewed and are negative.  Physical Exam Updated Vital Signs BP 101/73   Pulse (!) 135   Temp 99.5 F (37.5 C) (Oral)   Resp (!) 24   Ht 4\' 11"  (1.499 m)   Wt 68.6 kg   SpO2 95%   BMI 30.54 kg/m   Physical Exam Vitals and nursing [name] reviewed.  Constitutional:      General: She is not in acute distress.    Appearance: She is well-developed. She is not diaphoretic.  HENT:     Head: Normocephalic and atraumatic.  Cardiovascular:     Rate and Rhythm: Tachycardia present. Rhythm irregular.     Heart sounds: Normal heart sounds.  Pulmonary:     Effort: Pulmonary effort is normal. Tachypnea present.     Breath sounds: Normal breath sounds.  Chest:     Chest wall: No tenderness.  Abdominal:  Palpations: Abdomen is soft.     Tenderness: There is abdominal tenderness.  Musculoskeletal:     Cervical back: Neck supple.     Right lower leg: No tenderness. No edema.     Left lower leg: No tenderness. No edema.  Skin:    General: Skin is warm and dry.     Findings: No erythema or rash.  Neurological:     Mental Status: She is alert and oriented to person, place, and time.  Psychiatric:        Behavior:  Behavior normal.    ED Results / Procedures / Treatments   Labs (all labs ordered are listed, but only abnormal results are displayed) Labs Reviewed  COMPREHENSIVE METABOLIC PANEL - Abnormal; Notable for the following components:      Result Value   Sodium 147 (*)    Potassium 3.3 (*)    Chloride 112 (*)    Glucose, Bld 132 (*)    Calcium 8.8 (*)    Total Protein 5.7 (*)    Albumin 3.4 (*)    AST 14 (*)    All other components within normal limits  CBC - Abnormal; Notable for the following components:   RBC 3.18 (*)    Hemoglobin 8.9 (*)    HCT 28.5 (*)    RDW 16.4 (*)    All other components within normal limits  MAGNESIUM - Abnormal; Notable for the following components:   Magnesium 1.5 (*)    All other components within normal limits  RESP PANEL BY RT-PCR (FLU A&B, COVID) ARPGX2  LIPASE, BLOOD  URINALYSIS, ROUTINE W REFLEX MICROSCOPIC  POC OCCULT BLOOD, ED  TROPONIN I (HIGH SENSITIVITY)  TROPONIN I (HIGH SENSITIVITY)    EKG EKG Interpretation  Date/Time:  Wednesday August 15 2020 22:19:26 EDT Ventricular Rate:  141 PR Interval:  112 QRS Duration: 76 QT Interval:  295 QTC Calculation: 452 R Axis:   78 Text Interpretation: Supraventricular tachycardia Ventricular bigeminy SVT Confirmed by Lavenia Atlas [phone]) on 08/15/2020 10:24:00 PM  Radiology No results found.  Procedures .Critical Care  Date/Time: 08/16/2020 12:17 AM Performed by: Tacy Learn, PA-C Authorized by: Tacy Learn, PA-C   Critical care provider statement:    Critical care time (minutes):  45   Critical care was time spent personally by me on the following activities:  Discussions with consultants, evaluation of patient's response to treatment, examination of patient, ordering and performing treatments and interventions, ordering and review of laboratory studies, ordering and review of radiographic studies, pulse oximetry, re-evaluation of patient's condition, obtaining history from  patient or surrogate and review of old charts   Medications Ordered in ED Medications  ondansetron (ZOFRAN) injection 4 mg (has no administration in time range)  magnesium sulfate IVPB 1 g 100 mL (has no administration in time range)  potassium chloride SA (KLOR-CON) CR tablet 20 mEq (has no administration in time range)  sodium chloride 0.9 % bolus 500 mL (500 mLs Intravenous New Bag/Given 08/15/20 2324)  famotidine (PEPCID) IVPB 20 mg premix (20 mg Intravenous New Bag/Given 08/15/20 2347)  alum & mag hydroxide-simeth (MAALOX/MYLANTA) 200-200-20 MG/5ML suspension 30 mL (30 mLs Oral Given 08/15/20 2345)    And  lidocaine (XYLOCAINE) 2 % viscous mouth solution 15 mL (15 mLs Oral Given 08/15/20 2341)  adenosine (ADENOCARD) 6 MG/2ML injection 6 mg (6 mg Intravenous Given 08/15/20 2335)  iohexol (OMNIPAQUE) 350 MG/ML injection 100 mL (100 mLs Intravenous Contrast Given 08/16/20 0009)    ED Course  I have reviewed the triage vital signs and the nursing notes.  Pertinent labs & imaging results that were available during my care of the patient were reviewed by me and considered in my medical decision making (see chart for details).  Clinical Course as of 08/16/20 0019  Wed Aug 15, 2020  2238 Creatinine: 0.76 [LM]  Thu Aug 16, 1092  2724 80 year old female with complaint of epigastric abdominal pain which radiates up into her chest, states does not feel like her prior heart attack.  Onset of symptoms last night, constant since that time.  Denies shortness of breath or difficulty breathing, denies palpitations, nausea, vomiting.  States that she has had a few loose stools since her chemotherapy treatment for her non-small cell lung cancer 2 days ago. On exam, patient is tachypneic, appears uncomfortable, has generalized abdominal tenderness.  Heart rate found to be in the 1 40-1 50 range with blood pressure 101/70.  Patient is tachypneic to the 30s. Vagal maneuver attempted with improvement however heart  rate were immediately returns to 150s. Dr. Dina Rich, ER attending, called to bedside to evaluate patient.  Decision was made to attempt cardioversion with adenosine.  Patient tolerated procedure well, see Dr.Horton's [name]. HR improved to the 90s. Plan is for CT chest to evaluate for PE, CT abdomen pelvis for abdominal pain. Review of labs, found to have a hemoglobin of 8.9 today, down from 10.62 days ago.  Will obtain Hemoccult, patient denies dark or bloody stools. CMP with potassium of 3.3, will order oral potassium.  Magnesium 1.5, IV potassium ordered.  Initial troponin is 7, lipase is 24. Care signed out pending further imaging and disposition. [LM]    Clinical Course User Index [LM] Roque Lias   MDM Rules/Calculators/A&P                           Final Clinical Impression(s) / ED Diagnoses Final diagnoses:  SVT (supraventricular tachycardia) ([place])  Hypomagnesemia  Epigastric abdominal pain  Hypokalemia  Anemia, unspecified type    Rx / DC Orders ED Discharge Orders     None        Tacy Learn, PA-C 08/16/20 0020    Horton, Alvin Critchley, DO 08/16/20 1506

## 2020-08-16 ENCOUNTER — Emergency Department (HOSPITAL_COMMUNITY): Payer: Medicare Other

## 2020-08-16 ENCOUNTER — Encounter (HOSPITAL_COMMUNITY): Payer: Self-pay | Admitting: Family Medicine

## 2020-08-16 ENCOUNTER — Ambulatory Visit: Payer: Medicare Other

## 2020-08-16 DIAGNOSIS — E876 Hypokalemia: Secondary | ICD-10-CM

## 2020-08-16 DIAGNOSIS — I471 Supraventricular tachycardia: Secondary | ICD-10-CM | POA: Diagnosis not present

## 2020-08-16 DIAGNOSIS — D649 Anemia, unspecified: Secondary | ICD-10-CM

## 2020-08-16 DIAGNOSIS — I2510 Atherosclerotic heart disease of native coronary artery without angina pectoris: Secondary | ICD-10-CM

## 2020-08-16 DIAGNOSIS — I5033 Acute on chronic diastolic (congestive) heart failure: Secondary | ICD-10-CM

## 2020-08-16 DIAGNOSIS — R195 Other fecal abnormalities: Secondary | ICD-10-CM | POA: Diagnosis present

## 2020-08-16 DIAGNOSIS — C3431 Malignant neoplasm of lower lobe, right bronchus or lung: Secondary | ICD-10-CM

## 2020-08-16 LAB — CBC
HCT: 28.6 % — ABNORMAL LOW (ref 36.0–46.0)
HCT: 29.2 % — ABNORMAL LOW (ref 36.0–46.0)
Hemoglobin: 8.9 g/dL — ABNORMAL LOW (ref 12.0–15.0)
Hemoglobin: 9.2 g/dL — ABNORMAL LOW (ref 12.0–15.0)
MCH: 28.3 pg (ref 26.0–34.0)
MCH: 28 pg (ref 26.0–34.0)
MCHC: 31.1 g/dL (ref 30.0–36.0)
MCHC: 31.5 g/dL (ref 30.0–36.0)
MCV: 91.1 fL (ref 80.0–100.0)
MCV: 89 fL (ref 80.0–100.0)
Platelets: 173 10*3/uL (ref 150–400)
Platelets: 178 10*3/uL (ref 150–400)
RBC: 3.14 MIL/uL — ABNORMAL LOW (ref 3.87–5.11)
RBC: 3.28 MIL/uL — ABNORMAL LOW (ref 3.87–5.11)
RDW: 16.6 % — ABNORMAL HIGH (ref 11.5–15.5)
RDW: 16.5 % — ABNORMAL HIGH (ref 11.5–15.5)
WBC: 5.2 10*3/uL (ref 4.0–10.5)
WBC: 5.9 10*3/uL (ref 4.0–10.5)
nRBC: 0 % (ref 0.0–0.2)
nRBC: 0 % (ref 0.0–0.2)

## 2020-08-16 LAB — C DIFFICILE QUICK SCREEN W PCR REFLEX
C Diff antigen: POSITIVE — AB
C Diff toxin: NEGATIVE

## 2020-08-16 LAB — RESP PANEL BY RT-PCR (FLU A&B, COVID) ARPGX2
Influenza A by PCR: NEGATIVE
Influenza B by PCR: NEGATIVE
SARS Coronavirus 2 by RT PCR: NEGATIVE

## 2020-08-16 LAB — POC OCCULT BLOOD, ED: Fecal Occult Bld: POSITIVE — AB

## 2020-08-16 LAB — BASIC METABOLIC PANEL
Anion gap: 6 (ref 5–15)
BUN: 16 mg/dL (ref 8–23)
CO2: 24 mmol/L (ref 22–32)
Calcium: 8 mg/dL — ABNORMAL LOW (ref 8.9–10.3)
Chloride: 115 mmol/L — ABNORMAL HIGH (ref 98–111)
Creatinine, Ser: 0.78 mg/dL (ref 0.44–1.00)
GFR, Estimated: >60 mL/min (ref >60)
Glucose, Bld: 111 mg/dL — ABNORMAL HIGH (ref 70–99)
Potassium: 3.9 mmol/L (ref 3.5–5.1)
Sodium: 145 mmol/L (ref 135–145)

## 2020-08-16 LAB — MAGNESIUM: Magnesium: 2.6 mg/dL — ABNORMAL HIGH (ref 1.7–2.4)

## 2020-08-16 LAB — URINALYSIS, ROUTINE W REFLEX MICROSCOPIC
Bilirubin Urine: NEGATIVE
Glucose, UA: NEGATIVE mg/dL
Hgb urine dipstick: NEGATIVE
Ketones, ur: NEGATIVE mg/dL
Leukocytes,Ua: NEGATIVE
Nitrite: NEGATIVE
Protein, ur: NEGATIVE mg/dL
Specific Gravity, Urine: >1.046 — ABNORMAL HIGH (ref 1.005–1.030)
pH: 5 (ref 5.0–8.0)

## 2020-08-16 LAB — TYPE AND SCREEN
ABO/RH(D): B POS
Antibody Screen: NEGATIVE

## 2020-08-16 LAB — MRSA NEXT GEN BY PCR, NASAL: MRSA by PCR Next Gen: NOT DETECTED

## 2020-08-16 LAB — CLOSTRIDIUM DIFFICILE BY PCR, REFLEXED: Toxigenic C. Difficile by PCR: NEGATIVE

## 2020-08-16 LAB — TROPONIN I (HIGH SENSITIVITY): Troponin I (High Sensitivity): 9 ng/L (ref <18)

## 2020-08-16 MED ORDER — ORAL CARE MOUTH RINSE
15.0000 mL | Freq: Two times a day (BID) | OROMUCOSAL | Status: DC
  Administered 2020-08-16 – 2020-08-17 (×2): 15 mL via OROMUCOSAL

## 2020-08-16 MED ORDER — POTASSIUM CHLORIDE 10 MEQ/100ML IV SOLN
10.0000 meq | INTRAVENOUS | Status: AC
  Administered 2020-08-16 (×2): 10 meq via INTRAVENOUS
  Filled 2020-08-16 (×2): qty 100

## 2020-08-16 MED ORDER — IOHEXOL 350 MG/ML SOLN
100.0000 mL | Freq: Once | INTRAVENOUS | Status: AC | PRN
  Administered 2020-08-16: 100 mL via INTRAVENOUS

## 2020-08-16 MED ORDER — ONDANSETRON HCL 4 MG PO TABS: 4.0000 mg | ORAL_TABLET | Freq: Four times a day (QID) | ORAL | Status: DC | PRN

## 2020-08-16 MED ORDER — METOPROLOL TARTRATE 5 MG/5ML IV SOLN
2.5000 mg | INTRAVENOUS | Status: DC | PRN
  Administered 2020-08-16: 2.5 mg via INTRAVENOUS
  Filled 2020-08-16: qty 5

## 2020-08-16 MED ORDER — METOPROLOL SUCCINATE ER 25 MG PO TB24
50.0000 mg | ORAL_TABLET | Freq: Every day | ORAL | Status: DC
  Administered 2020-08-16 – 2020-08-17 (×2): 50 mg via ORAL
  Filled 2020-08-16 (×2): qty 2

## 2020-08-16 MED ORDER — CHLORHEXIDINE GLUCONATE CLOTH 2 % EX PADS
6.0000 | MEDICATED_PAD | Freq: Every day | CUTANEOUS | Status: DC
  Administered 2020-08-16: 6 via TOPICAL

## 2020-08-16 MED ORDER — FUROSEMIDE 10 MG/ML IJ SOLN: 20.0000 mg | Freq: Once | INTRAMUSCULAR | Status: AC

## 2020-08-16 MED ORDER — PANTOPRAZOLE SODIUM 40 MG IV SOLR
40.0000 mg | Freq: Two times a day (BID) | INTRAVENOUS | Status: DC
  Administered 2020-08-16 – 2020-08-17 (×3): 40 mg via INTRAVENOUS
  Filled 2020-08-16 (×3): qty 40

## 2020-08-16 MED ORDER — SODIUM CHLORIDE 0.9 % IV SOLN: 250.0000 mL | INTRAVENOUS | Status: DC | PRN

## 2020-08-16 MED ORDER — POTASSIUM CHLORIDE CRYS ER 20 MEQ PO TBCR
20.0000 meq | EXTENDED_RELEASE_TABLET | Freq: Once | ORAL | Status: AC
  Administered 2020-08-16: 20 meq via ORAL

## 2020-08-16 MED ORDER — PANTOPRAZOLE SODIUM 40 MG IV SOLR
40.0000 mg | INTRAVENOUS | Status: DC
  Administered 2020-08-16: 40 mg via INTRAVENOUS
  Filled 2020-08-16: qty 40

## 2020-08-16 MED ORDER — MAGNESIUM SULFATE IN D5W 1-5 GM/100ML-% IV SOLN
1.0000 g | Freq: Once | INTRAVENOUS | Status: AC
  Administered 2020-08-16: 1 g via INTRAVENOUS
  Filled 2020-08-16: qty 100

## 2020-08-16 MED ORDER — ACETAMINOPHEN 325 MG PO TABS
650.0000 mg | ORAL_TABLET | Freq: Four times a day (QID) | ORAL | Status: DC | PRN
  Administered 2020-08-16 – 2020-08-17 (×2): 650 mg via ORAL
  Filled 2020-08-16 (×2): qty 2

## 2020-08-16 MED ORDER — FUROSEMIDE 10 MG/ML IJ SOLN
INTRAMUSCULAR | Status: AC
  Administered 2020-08-16: 20 mg via INTRAVENOUS
  Filled 2020-08-16: qty 2

## 2020-08-16 MED ORDER — ACETAMINOPHEN 650 MG RE SUPP: 650.0000 mg | Freq: Four times a day (QID) | RECTAL | Status: DC | PRN

## 2020-08-16 MED ORDER — MAGNESIUM SULFATE 2 GM/50ML IV SOLN
2.0000 g | Freq: Once | INTRAVENOUS | Status: AC
  Administered 2020-08-16: 2 g via INTRAVENOUS
  Filled 2020-08-16: qty 50

## 2020-08-16 MED ORDER — ONDANSETRON HCL 4 MG/2ML IJ SOLN: 4.0000 mg | Freq: Four times a day (QID) | INTRAMUSCULAR | Status: DC | PRN

## 2020-08-16 MED ORDER — METOPROLOL TARTRATE 5 MG/5ML IV SOLN
2.5000 mg | Freq: Once | INTRAVENOUS | Status: AC
  Administered 2020-08-16: 2.5 mg via INTRAVENOUS
  Filled 2020-08-16: qty 5

## 2020-08-16 NOTE — H&P (Signed)
History and Physical    [name] PJA:[id number] DOB: Jan 06, 1941 DOA: 08/15/2020  PCP: [name], MD   Patient coming from: Home   Chief Complaint: Epigastric pain   HPI: [name] is a 80 y.o. female with medical history significant for Barrett esophagus, CAD, chronic diastolic CHF, remote DVT no longer anticoagulated, history of bowel obstruction status post partial colectomy/colostomy and subsequent takedown, history of CVA, and non-small cell lung cancer diagnosed in June 2022 undergoing chemotherapy and radiation, now presenting to the emergency department with epigastric pain.  The patient reports that she had been doing fairly well, felt that she has been tolerating radiation and chemotherapy well, but then woke up 2 nights ago with epigastric pain that has persisted.  Pain has been constant, stabbing in character, severe at times, and without any alleviating or exacerbating factors identified.  She does not remember experiencing similar pain previously.  She has been more short of breath than usual recently but denies any change in her chronic nonproductive cough.  She has not had any fevers or chills.  She denies vomiting, diarrhea, melena, or hematochezia.  She denies chest pain and had not been experiencing any palpitations until she was in the ED.    ED Course: Upon arrival to the ED, patient is found to be afebrile, saturating well on room air, and with heart rate reaching 150s.  EKG features supraventricular tachycardia.  CTA chest is negative for PE but notable for 2 cm cavitary lesion in the right lower lobe.  CT of the abdomen and pelvis is negative for acute findings.  Chemistry panel notable for sodium 147, potassium 3.3, and magnesium 1.5.  CBC with hemoglobin 8.9.  Troponin is normal.  COVID-19 negative.  Fecal occult blood testing is positive.  Patient was treated with adenosine, magnesium, potassium, IV Lopressor, Pepcid, and 500 cc of normal saline in  the ED.  Review of Systems:  All other systems reviewed and apart from HPI, are negative.  Past Medical History:  Diagnosis Date   Acute respiratory failure with hypoxia s/p tracheostomy 07/20/2013   Anemia    a. 7-08/2013 felt due to recent critical illness/chronic disease.   Arthritis    handds, knees & back    Barrett's esophagus    CAD (coronary artery disease)    a. Mod dz 2010 initially mgd medically. b. 12/2012 - angina s/p PTCA/DES to mid-circumflex, PTCA/DES to first OM.    Carotid artery disease ([place])    a. 60-70% bilat ICA stenosis by dopplers 05/2012.   Chronic back pain    deteriorating;DDD   Colonic obstruction due to suspected colitis; s/p colectomy/colostomy    a. See SBO.   Complication of anesthesia    hard to wake up from anesthesia    COPD (chronic obstructive pulmonary disease) (HCC)    Depression    takes Remeron and Zoloft daily   Dizziness    was on Bentyl which caused this-took off of it and no problems since   DVT of axillary vein, acute right ([place])    a. Dx 07/2013 felt due to R IJ central line that had been inserted 7/14. Not on anticoag due to GIB issues and small cerebral bleed.   GERD (gastroesophageal reflux disease)    Heart murmur    History of bronchitis    > 35yrs ago    History of colon polyps    History of hiatal hernia    History of kidney stones  was told it was stable but doesn't know for sure that she ever passed it   HTN (hypertension)    Hypertension    takes Metoprolol daily   Intracranial hemorrhage ([place])    a. 08/11/13 - per DC summary, tiny SAH vs SDH done for altered mentation, anticoag stopped including aspirin.   Joint pain    Neck pain    DDD   Other and unspecified hyperlipidemia    takes Lipitor daily   Pneumonia    hx of->15 yrs ago   Protein-calorie malnutrition, severe w/ electrolyte imbalance    a. Severe hypoalbuminemia leading to 3rd spacing including anasarca and pulm edema 07/2013.   SBO (small bowel  obstruction) ([place])    a. Lysis of adhesions and ovarian cystectomy 04/2013 for sBO. b. Admitted 07/2013 for colonic obstruction due to suspected colitis, s/p partial colectomy/colostomy 08/01/13. Admission complicated by anasarca, acute resp failure requiring tracheostomy, decannulated 08/25/13. c. Recurrent SBO8/2015, NGT placed for decompression.    Stroke [facility]) early 80's   right sided weakness--TIA    Past Surgical History:  Procedure Laterality Date   ABDOMINAL HYSTERECTOMY     APPENDECTOMY     BOWEL RESECTION N/A 08/01/2013   Procedure: SMALL BOWEL RESECTION;  Surgeon: Harl Bowie, MD;  Location: [place];  Service: General;  Laterality: N/A;   BRONCHIAL BIOPSY  07/02/2020   Procedure: BRONCHIAL BIOPSIES;  Surgeon: Collene Gobble, MD;  Location: [facility] ENDOSCOPY;  Service: Pulmonary;;   BRONCHIAL BRUSHINGS  07/02/2020   Procedure: BRONCHIAL BRUSHINGS;  Surgeon: Collene Gobble, MD;  Location: [facility] ENDOSCOPY;  Service: Pulmonary;;   BRONCHIAL NEEDLE ASPIRATION BIOPSY  07/02/2020   Procedure: BRONCHIAL NEEDLE ASPIRATION BIOPSIES;  Surgeon: Collene Gobble, MD;  Location: [place];  Service: Pulmonary;;   BRONCHIAL WASHINGS  07/02/2020   Procedure: BRONCHIAL WASHINGS;  Surgeon: Collene Gobble, MD;  Location: [place] ENDOSCOPY;  Service: Pulmonary;;   cataract surgery Bilateral    CHOLECYSTECTOMY     COLON RESECTION     COLON SURGERY     COLOSTOMY N/A 08/01/2013   Procedure: COLOSTOMY;  Surgeon: Harl Bowie, MD;  Location: [place];  Service: General;  Laterality: N/A;   COLOSTOMY TAKEDOWN  01/24/2014   dr blackman   COLOSTOMY TAKEDOWN N/A 01/24/2014   Procedure: COLOSTOMY TAKEDOWN;  Surgeon: Coralie Keens, MD;  Location: [place];  Service: General;  Laterality: N/A;   CORONARY ANGIOPLASTY WITH STENT PLACEMENT  12/20/2012   STENT TO OM         DR [place]   ENDARTERECTOMY Right 08/08/2014   Procedure: RIGHT CAROTID ENDARTERECTOMY WITH HEMASHIELD PATCH ANGIOPLASTY;  Surgeon: Elam Dutch,  MD;  Location: [place];  Service: Vascular;  Laterality: Right;   FIDUCIAL MARKER PLACEMENT  07/02/2020   Procedure: FIDUCIAL MARKER PLACEMENT;  Surgeon: Collene Gobble, MD;  Location: [place] ENDOSCOPY;  Service: Pulmonary;;   FOOT SURGERY Left    HAND SURGERY Bilateral    for ganglion cysts   HEMOSTASIS CONTROL  07/02/2020   Procedure: HEMOSTASIS CONTROL;  Surgeon: Collene Gobble, MD;  Location: [place] ENDOSCOPY;  Service: Pulmonary;;  ice saline and epi   LAPAROTOMY N/A 08/01/2013   Procedure: EXPLORATORY LAPAROTOMY;  Surgeon: Harl Bowie, MD;  Location: [place];  Service: General;  Laterality: N/A;   LEFT HEART CATH AND CORONARY ANGIOGRAPHY N/A 01/10/2019   Procedure: LEFT HEART CATH AND CORONARY ANGIOGRAPHY;  Surgeon: Troy Sine, MD;  Location: [place] CV LAB;  Service: Cardiovascular;  Laterality: N/A;   LEFT HEART CATHETERIZATION WITH CORONARY ANGIOGRAM N/A 12/20/2012   Procedure: LEFT HEART CATHETERIZATION WITH CORONARY ANGIOGRAM;  Surgeon: Blane Ohara, MD;  Location: [facility] CATH LAB;  Service: Cardiovascular;  Laterality: N/A;   PARTIAL COLECTOMY N/A 08/01/2013   Procedure: PARTIAL COLECTOMY;  Surgeon: Harl Bowie, MD;  Location: [place];  Service: General;  Laterality: N/A;   ROTATOR CUFF REPAIR Left    TONSILLECTOMY     TRACHEOSTOMY     feinstein   VIDEO BRONCHOSCOPY WITH ENDOBRONCHIAL NAVIGATION N/A 07/02/2020   Procedure: VIDEO BRONCHOSCOPY WITH ENDOBRONCHIAL NAVIGATION;  Surgeon: Collene Gobble, MD;  Location: [place] ENDOSCOPY;  Service: Pulmonary;  Laterality: N/A;    Social History:   reports that she has quit smoking. Her smoking use included cigarettes. She has a 13.25 pack-year smoking history. She has never used smokeless tobacco. She reports that she does not drink alcohol and does not use drugs.  Allergies  Allergen Reactions   Pregabalin Swelling    Tongue swelling   Sulfonamide Derivatives Swelling    Childhood reaction    Family History  Problem Relation  Age of Onset   Varicose Veins Mother    Heart disease Father    Heart attack Father    AAA (abdominal aortic aneurysm) Father    Heart disease Other      Prior to Admission medications   Medication Sig Start Date End Date Taking? Authorizing Provider  aspirin EC 81 MG tablet Take 2 tablets (162 mg total) by mouth in the morning. Okay to restart this medication on 07/04/2020 07/02/20   Collene Gobble, MD  atorvastatin (LIPITOR) 80 MG tablet Take 80 mg by mouth every evening.     [provider]  buPROPion (WELLBUTRIN XL) 150 MG 24 hr tablet Take 1 tablet (150 mg total) by mouth daily. 03/02/15   Dorothy Spark, MD  CALCIUM PO Take 1 tablet by mouth in the morning.    [provider]  Cholecalciferol (VITAMIN D3 PO) Take 1 tablet by mouth in the morning.    [provider]  fish oil-omega-3 fatty acids 1000 MG capsule Take 1,000 mg by mouth in the morning.    [provider]  fluticasone (FLONASE) 50 MCG/ACT nasal spray Place 1 spray into both nostrils daily as needed for allergies. 11/18/18   [provider]  folic acid (FOLVITE) 098 MCG tablet Take 800 mcg by mouth in the morning.    [provider]  lidocaine-prilocaine (EMLA) cream Apply 1 application topically as needed. 08/13/20   Heilingoetter, Cassandra L, PA-C  metoprolol succinate (TOPROL-XL) 50 MG 24 hr tablet TAKE 1 TABLET BY MOUTH  DAILY 12/21/19   Camnitz, Ocie Doyne, MD  mirtazapine (REMERON) 45 MG tablet Take 45 mg by mouth at bedtime.  06/29/18   [provider]  nitroGLYCERIN (NITROSTAT) 0.4 MG SL tablet DISSOLVE 1 TABLET UNDER THE TONGUE EVERY 5 MINUTES AS  NEEDED FOR CHEST PAIN. MAX  OF 3 TABLETS IN 15 MINUTES. CALL 911 IF PAIN PERSISTS. 06/04/20   Nahser, Wonda Cheng, MD  ondansetron (ZOFRAN) 4 MG tablet Take 4 mg by mouth every 8 (eight) hours as needed for nausea or vomiting.     [provider]  pantoprazole (PROTONIX) 40 MG tablet Take 40 mg by mouth in  the morning.    [provider]  prochlorperazine (COMPAZINE) 10 MG tablet Take 1 tablet (10 mg total) by mouth every 6 (six) hours as needed for  nausea or vomiting. 07/27/20   Curt Bears, MD  sertraline (ZOLOFT) 100 MG tablet Take 200 mg by mouth in the morning. 100 mg + 100 mg= 200 mg daily 06/27/14   [provider]  sertraline (ZOLOFT) 50 MG tablet Take 50 mg by mouth in the morning. 50 mg + 100 mg=150 mg daily    [provider]    Physical Exam: Vitals:   08/16/20 0100 08/16/20 0130 08/16/20 0200 08/16/20 0230  BP: (!) 168/157 132/67 112/64 (!) 104/50  Pulse: (!) 119 100 92 85  Resp: (!) 23 (!) 34 (!) 28 (!) 23  Temp:      TempSrc:      SpO2: 99% 98% 99% 97%  Weight:      Height:        Constitutional: NAD, calm  Eyes: PERTLA, lids and conjunctivae normal ENMT: Mucous membranes are moist. Posterior pharynx clear of any exudate or lesions.   Neck: supple, no masses  Respiratory: Speaking full sentences. No wheezing. No accessory muscle use.   Cardiovascular: S1 & S2 heard, regular rate and rhythm. No extremity edema.   Abdomen: No distension, soft, no rebound pain or guarding. Bowel sounds active.  Musculoskeletal: no clubbing / cyanosis. No joint deformity upper and lower extremities.   Skin: no significant rashes, lesions, ulcers. Warm, dry, well-perfused. Neurologic: CN 2-12 grossly intact. Sensation intact. Moving all extremities.  Psychiatric: Alert and oriented to person, place, and situation. Pleasant and cooperative.    Labs and Imaging on Admission: I have personally reviewed following labs and imaging studies  CBC: Recent Labs  Lab 08/13/20 1007 08/15/20 2035  WBC 4.0 4.7  NEUTROABS 2.2  --   HGB 9.7* 8.9*  HCT 30.8* 28.5*  MCV 88.0 89.6  PLT 180 941   Basic Metabolic Panel: Recent Labs  Lab 08/13/20 1007 08/15/20 2035  NA 146* 147*  K 3.6 3.3*  CL 115* 112*  CO2 24 23  GLUCOSE 94 132*  BUN 14 19  CREATININE 0.76  0.76  CALCIUM 8.6* 8.8*  MG  --  1.5*   GFR: Estimated Creatinine Clearance: 46.5 mL/min (by C-G formula based on SCr of 0.76 mg/dL). Liver Function Tests: Recent Labs  Lab 08/13/20 1007 08/15/20 2035  AST 15 14*  ALT 14 16  ALKPHOS 68 56  BILITOT 0.5 0.5  PROT 6.0* 5.7*  ALBUMIN 3.4* 3.4*   Recent Labs  Lab 08/15/20 2035  LIPASE 24   No results for input(s): AMMONIA in the last 168 hours. Coagulation Profile: No results for input(s): INR, PROTIME in the last 168 hours. Cardiac Enzymes: No results for input(s): CKTOTAL, CKMB, CKMBINDEX, TROPONINI in the last 168 hours. BNP (last 3 results) No results for input(s): PROBNP in the last 8760 hours. HbA1C: No results for input(s): HGBA1C in the last 72 hours. CBG: No results for input(s): GLUCAP in the last 168 hours. Lipid Profile: No results for input(s): CHOL, HDL, LDLCALC, TRIG, CHOLHDL, LDLDIRECT in the last 72 hours. Thyroid Function Tests: No results for input(s): TSH, T4TOTAL, FREET4, T3FREE, THYROIDAB in the last 72 hours. Anemia Panel: No results for input(s): VITAMINB12, FOLATE, FERRITIN, TIBC, IRON, RETICCTPCT in the last 72 hours. Urine analysis:    Component Value Date/Time   COLORURINE YELLOW 01/23/2015 2029   APPEARANCEUR CLEAR 01/23/2015 2029   LABSPEC 1.018 01/23/2015 2029   PHURINE 6.5 01/23/2015 2029   GLUCOSEU NEGATIVE 01/23/2015 2029   HGBUR NEGATIVE 01/23/2015 2029   [place] NEGATIVE 01/23/2015 2029  [place] NEGATIVE 01/23/2015 2029   PROTEINUR NEGATIVE 01/23/2015 2029   UROBILINOGEN 0.2 08/29/2014 1856   NITRITE NEGATIVE 01/23/2015 2029   LEUKOCYTESUR TRACE (A) 01/23/2015 2029   Sepsis Labs: @LABRCNTIP (procalcitonin:4,lacticidven:4) ) Recent Results (from the past 240 hour(s))  Resp Panel by RT-PCR (Flu A&B, Covid) Nasopharyngeal Swab     Status: None   Collection Time: 08/16/20  1:25 AM   Specimen: Nasopharyngeal Swab; Nasopharyngeal(NP) swabs in vial transport medium  Result  Value Ref Range Status   SARS Coronavirus 2 by RT PCR NEGATIVE NEGATIVE Final    Comment: (NOTE) SARS-CoV-2 target nucleic acids are NOT DETECTED.  The SARS-CoV-2 RNA is generally detectable in upper respiratory specimens during the acute phase of infection. The lowest concentration of SARS-CoV-2 viral copies this assay can detect is 138 copies/mL. A negative result does not preclude SARS-Cov-2 infection and should not be used as the sole basis for treatment or other patient management decisions. A negative result may occur with  improper specimen collection/handling, submission of specimen other than nasopharyngeal swab, presence of viral mutation(s) within the areas targeted by this assay, and inadequate number of viral copies(<138 copies/mL). A negative result must be combined with clinical observations, patient history, and epidemiological information. The expected result is Negative.  Fact Sheet for Patients:  EntrepreneurPulse.com.au  Fact Sheet for Healthcare Providers:  IncredibleEmployment.be  This test is no t yet approved or cleared by the [place] FDA and  has been authorized for detection and/or diagnosis of SARS-CoV-2 by FDA under an Emergency Use Authorization (EUA). This EUA will remain  in effect (meaning this test can be used) for the duration of the COVID-19 declaration under Section 564(b)(1) of the Act, 21 U.S.C.section 360bbb-3(b)(1), unless the authorization is terminated  or revoked sooner.       Influenza A by PCR NEGATIVE NEGATIVE Final   Influenza B by PCR NEGATIVE NEGATIVE Final    Comment: (NOTE) The Xpert Xpress SARS-CoV-2/FLU/RSV plus assay is intended as an aid in the diagnosis of influenza from Nasopharyngeal swab specimens and should not be used as a sole basis for treatment. Nasal washings and aspirates are unacceptable for Xpert Xpress SARS-CoV-2/FLU/RSV testing.  Fact Sheet for  Patients: EntrepreneurPulse.com.au  Fact Sheet for Healthcare Providers: IncredibleEmployment.be  This test is not yet approved or cleared by the [place] FDA and has been authorized for detection and/or diagnosis of SARS-CoV-2 by FDA under an Emergency Use Authorization (EUA). This EUA will remain in effect (meaning this test can be used) for the duration of the COVID-19 declaration under Section 564(b)(1) of the Act, 21 U.S.C. section 360bbb-3(b)(1), unless the authorization is terminated or revoked.  Performed at [facility], [place] [address] [place], [place] [address]      Radiological Exams on Admission: CT Angio Chest PE W/Cm &/Or Wo Cm  Result Date: 08/16/2020 CLINICAL DATA:  80 year old female with shortness of breath and abdominal pain. EXAM: CT ANGIOGRAPHY CHEST CT ABDOMEN AND PELVIS WITH CONTRAST TECHNIQUE: Multidetector CT imaging of the chest was performed using the standard protocol during bolus administration of intravenous contrast. Multiplanar CT image reconstructions and MIPs were obtained to evaluate the vascular anatomy. Multidetector CT imaging of the abdomen and pelvis was performed using the standard protocol during bolus administration of intravenous contrast. CONTRAST:  172mL OMNIPAQUE IOHEXOL 350 MG/ML SOLN COMPARISON:  Chest radiograph dated 07/02/2020 and CT abdomen pelvis dated 08/29/2014. FINDINGS: Evaluation of this exam is limited due to respiratory motion artifact. CTA CHEST FINDINGS Cardiovascular: There  is mild cardiomegaly. No pericardial effusion. There is a 3 vessel coronary vascular calcification. There is mild atherosclerotic calcification of thoracic aorta. No aneurysmal dilatation. Mildly dilated main pulmonary trunk may represent pulmonary hypertension. Clinical correlation is recommended. Evaluation of the pulmonary arteries is limited due to respiratory motion artifact and suboptimal  visualization of the peripheral branches. No definite pulmonary artery embolus identified. Mediastinum/Nodes: No hilar or mediastinal adenopathy. The esophagus is grossly unremarkable. No mediastinal fluid collection. Lungs/Pleura: Small bilateral pleural effusions. There is diffuse interstitial and interlobular septal prominence and edema. There is background of centrilobular emphysema. There is a 2 cm cavitary lesion with air-fluid level in the right lower lobe which may represent fungal infection, abscess. Other etiologies including TB or neoplasm is not excluded. Clinical correlation is recommended. Several adjacent metallic clips noted. This collection therefore may represent post bronchoscopy or biopsy changes. There is no pneumothorax. The central airways are patent. Musculoskeletal: There is osteopenia with degenerative changes of the spine. No acute osseous pathology. Review of the MIP images confirms the above findings. CT ABDOMEN and PELVIS FINDINGS No intra-abdominal free air or free fluid. Hepatobiliary: The liver is unremarkable. Mild intrahepatic biliary ductal dilatation. Cholecystectomy. Pancreas: Unremarkable. No pancreatic ductal dilatation or surrounding inflammatory changes. Spleen: Normal in size without focal abnormality. Adrenals/Urinary Tract: The adrenal glands are unremarkable. There is no hydronephrosis on either side. There is symmetric enhancement and excretion of contrast by both kidneys. The visualized ureters and urinary bladder appear unremarkable. Stomach/Bowel: There is postsurgical changes of bowel with multiple anastomotic sutures. There is no bowel obstruction or active inflammation. Appendectomy. Vascular/Lymphatic: Moderate aortoiliac atherosclerotic disease. The IVC is unremarkable. No portal venous gas. There is no adenopathy. Reproductive: Hysterectomy. No adnexal masses. Other: Midline vertical anterior abdominal wall incisional scar. There is a broad-based ventral hernia  containing a segment of the transverse colon without evidence of obstruction. Musculoskeletal: Osteopenia with degenerative changes of the spine and hips. No acute osseous pathology. Review of the MIP images confirms the above findings. IMPRESSION: 1. No CT evidence of pulmonary artery embolus. 2. A 2 cm cavitary lesion with air-fluid level in the right lower lobe may represent fungal infection, abscess, or less likely malignancy. Clinical correlation and follow-up recommended. 3. Cardiomegaly with findings of CHF and small bilateral pleural effusions. 4. No acute intra-abdominal or pelvic pathology. 5. Aortic Atherosclerosis (ICD10-I70.0) and Emphysema (ICD10-J43.9). Electronically Signed   By: Anner Crete M.D.   On: 08/16/2020 00:41   CT Abdomen Pelvis W Contrast  Result Date: 08/16/2020 CLINICAL DATA:  80 year old female with shortness of breath and abdominal pain. EXAM: CT ANGIOGRAPHY CHEST CT ABDOMEN AND PELVIS WITH CONTRAST TECHNIQUE: Multidetector CT imaging of the chest was performed using the standard protocol during bolus administration of intravenous contrast. Multiplanar CT image reconstructions and MIPs were obtained to evaluate the vascular anatomy. Multidetector CT imaging of the abdomen and pelvis was performed using the standard protocol during bolus administration of intravenous contrast. CONTRAST:  191mL OMNIPAQUE IOHEXOL 350 MG/ML SOLN COMPARISON:  Chest radiograph dated 07/02/2020 and CT abdomen pelvis dated 08/29/2014. FINDINGS: Evaluation of this exam is limited due to respiratory motion artifact. CTA CHEST FINDINGS Cardiovascular: There is mild cardiomegaly. No pericardial effusion. There is a 3 vessel coronary vascular calcification. There is mild atherosclerotic calcification of thoracic aorta. No aneurysmal dilatation. Mildly dilated main pulmonary trunk may represent pulmonary hypertension. Clinical correlation is recommended. Evaluation of the pulmonary arteries is limited due  to respiratory motion artifact and suboptimal visualization of the peripheral  branches. No definite pulmonary artery embolus identified. Mediastinum/Nodes: No hilar or mediastinal adenopathy. The esophagus is grossly unremarkable. No mediastinal fluid collection. Lungs/Pleura: Small bilateral pleural effusions. There is diffuse interstitial and interlobular septal prominence and edema. There is background of centrilobular emphysema. There is a 2 cm cavitary lesion with air-fluid level in the right lower lobe which may represent fungal infection, abscess. Other etiologies including TB or neoplasm is not excluded. Clinical correlation is recommended. Several adjacent metallic clips noted. This collection therefore may represent post bronchoscopy or biopsy changes. There is no pneumothorax. The central airways are patent. Musculoskeletal: There is osteopenia with degenerative changes of the spine. No acute osseous pathology. Review of the MIP images confirms the above findings. CT ABDOMEN and PELVIS FINDINGS No intra-abdominal free air or free fluid. Hepatobiliary: The liver is unremarkable. Mild intrahepatic biliary ductal dilatation. Cholecystectomy. Pancreas: Unremarkable. No pancreatic ductal dilatation or surrounding inflammatory changes. Spleen: Normal in size without focal abnormality. Adrenals/Urinary Tract: The adrenal glands are unremarkable. There is no hydronephrosis on either side. There is symmetric enhancement and excretion of contrast by both kidneys. The visualized ureters and urinary bladder appear unremarkable. Stomach/Bowel: There is postsurgical changes of bowel with multiple anastomotic sutures. There is no bowel obstruction or active inflammation. Appendectomy. Vascular/Lymphatic: Moderate aortoiliac atherosclerotic disease. The IVC is unremarkable. No portal venous gas. There is no adenopathy. Reproductive: Hysterectomy. No adnexal masses. Other: Midline vertical anterior abdominal wall  incisional scar. There is a broad-based ventral hernia containing a segment of the transverse colon without evidence of obstruction. Musculoskeletal: Osteopenia with degenerative changes of the spine and hips. No acute osseous pathology. Review of the MIP images confirms the above findings. IMPRESSION: 1. No CT evidence of pulmonary artery embolus. 2. A 2 cm cavitary lesion with air-fluid level in the right lower lobe may represent fungal infection, abscess, or less likely malignancy. Clinical correlation and follow-up recommended. 3. Cardiomegaly with findings of CHF and small bilateral pleural effusions. 4. No acute intra-abdominal or pelvic pathology. 5. Aortic Atherosclerosis (ICD10-I70.0) and Emphysema (ICD10-J43.9). Electronically Signed   By: Anner Crete M.D.   On: 08/16/2020 00:41    EKG: Independently reviewed. SVT.   Assessment/Plan   1. SVT  - Pt presents with epigastric pain and was found to be in SVT  - She was treated successfully with adenosine in ED, later went into SVT again, and responded well to 2.5 mg IV Lopressor  - Replace potassium to 4 and magnesium to 2, continue cardiac monitoring, continue Toprol   2. Occult GIB; anemia  - Patient reports hx of Barrett esophagus followed by GI in [place], presents with epigastric pain, and is found to be FOBT+ with a drop in Hgb to 8.9 from 10.6 on 08/06/20  - No acute findings on CT abd/pelvis; BUN is normal  - Hold ASA, continue PPI, trend H&H, consult GI    3. Non small cell lung cancer; cavitary lung lesion  - Diagnosed in June 2022 and started on chemotherapy and radiation  - There is a cavitary RLL lesion noted on CTA in ED that appears to correspond to the hypermetabolic lesion on PET scan from 07/05/20    4. CAD  - No anginal complaints  - Hold ASA for now in light of occult GIB     5. Acute on chronic diastolic CHF  - CTA chest concerning for acute CHF  - Hold off on diuresing for now as SBP is low 100s/MAP is 65 in  ED and she  is speaking full sentences and not hypoxic - SLIV, monitor volume status    6. Hypokalemia; hypomagnesemia  - Supplement potassium and magnesium, repeat labs in am     DVT prophylaxis: SCDs  Code Status: Full, confirmed with patient   Level of Care: Level of care: Stepdown Family Communication: Husband updated at bedside  Disposition Plan:  Patient is from: Home  Anticipated d/c is to: Home  Anticipated d/c date is: 7/29 or 08/18/20  Patient currently: Pending stability in HR and H&H, possible GI consultation   Consults called: message sent to GI with request for routine consultation  Admission status: Observation     Vianne Bulls, MD [facility]  08/16/2020, 3:41 AM

## 2020-08-16 NOTE — Progress Notes (Signed)
MD made aware of patient reporting 8/10 headache pain. Family requesting MD notification. PRN options already administered, see eMAR.

## 2020-08-16 NOTE — Progress Notes (Signed)
PROGRESS NOTE    [name] CALES  XLK:[id number] DOB: 05/13/1940 DOA: 08/15/2020 PCP: [name], MD    Brief Narrative:  80 y.o. female with medical history significant for Barrett esophagus, CAD, chronic diastolic CHF, remote DVT no longer anticoagulated, history of bowel obstruction status post partial colectomy/colostomy and subsequent takedown, history of CVA, and non-small cell lung cancer diagnosed in June 2022 undergoing chemotherapy and radiation, now presenting to the emergency department with epigastric pain.  The patient reports that she had been doing fairly well, felt that she has been tolerating radiation and chemotherapy well, but then woke up 2 nights ago with epigastric pain that has persisted.  Pain has been constant, stabbing in character, severe at times, and without any alleviating or exacerbating factors identified.  She does not remember experiencing similar pain previously.  She has been more short of breath than usual recently but denies any change in her chronic nonproductive cough.  She has not had any fevers or chills.  She denies vomiting, diarrhea, melena, or hematochezia.  She denies chest pain and had not been experiencing any palpitations until she was in the ED.    Assessment & Plan:   Principal Problem:   SVT (supraventricular tachycardia) (HCC) Active Problems:   CAD (coronary artery disease)   Hypokalemia   Hypomagnesemia   Anemia   Acute on chronic diastolic CHF (congestive heart failure) (HCC)   COPD (chronic obstructive pulmonary disease) (HCC)   Malignant neoplasm of bronchus of right lower lobe (HCC)   Occult GI bleeding  1. SVT  - Pt presents with epigastric pain and was found to be in SVT  - She was treated successfully with adenosine in ED, later went into SVT again, and responded well to 2.5 mg IV Lopressor in the ED, now on scheduled toprol XL - Cont to replace lytes with goal K of >4 and Mg >2   2. Occult GIB; anemia  -  Patient reports hx of Barrett esophagus followed by GI in [place], presents with epigastric pain, and is found to be FOBT+ with a drop in Hgb to 8.9 from 10.6 on 08/06/20  - No acute findings on CT abd/pelvis; BUN is normal - ASA on hold, continue PPI -GI consulted with recs for PPI and Carafate, stool studies   3. Non small cell lung cancer; cavitary lung lesion  - Diagnosed in June 2022 and started on chemotherapy and radiation  - There is a cavitary RLL lesion noted on CTA in ED that appears to correspond to the hypermetabolic lesion on PET scan from 07/05/20   -Appears to be stable at this time   4. CAD  - No anginal complaints  - Holding ASA for now in light of occult GIB , would resume when OK with GI   5. Acute on chronic diastolic CHF  - CTA chest concerning for acute CHF  - Hold off on diuresing for now as SBP is low 100s/MAP is 65 in ED and she is speaking full sentences and not hypoxic - SLIV, monitor volume status     6. Hypokalemia; hypomagnesemia  - replaced -Repeat lytes in AM and cont to replace as needed    DVT prophylaxis: SCD's Code Status: Full Family Communication: Pt in room, family not at bedside  Status is: Observation  The patient remains OBS appropriate and will d/c before 2 midnights.  Dispo: The patient is from: Home  Anticipated d/c is to: Home              Patient currently is not medically stable to d/c.   Difficult to place patient No   Consultants:  GI  Procedures:    Antimicrobials: Anti-infectives (From admission, onward)    None       Subjective: Reports feeling better this AM  Objective: Vitals:   08/16/20 1100 08/16/20 1200 08/16/20 1219 08/16/20 1300  BP: (!) 116/40   (!) 114/49  Pulse: 94 97  (!) 102  Resp: (!) 23 (!) 30  (!) 21  Temp:   98.8 F (37.1 C)   TempSrc:   Axillary   SpO2: 96% 95%  97%  Weight:      Height:        Intake/Output Summary (Last 24 hours) at 08/16/2020 1506 Last data filed at  08/16/2020 1300 Gross per 24 hour  Intake 1528.55 ml  Output 2450 ml  Net -921.45 ml   Filed Weights   08/15/20 2045 08/16/20 0534  Weight: 68.6 kg 68.9 kg    Examination: General exam: Awake, laying in bed, in nad Respiratory system: Normal respiratory effort, no wheezing Cardiovascular system: regular rate, s1, s2 Gastrointestinal system: Soft, nondistended, positive BS Central nervous system: CN2-12 grossly intact, strength intact Extremities: Perfused, no clubbing Skin: Normal skin turgor, no notable skin lesions seen Psychiatry: Mood normal // no visual hallucinations   Data Reviewed: I have personally reviewed following labs and imaging studies  CBC: Recent Labs  Lab 08/13/20 1007 08/15/20 2035 08/16/20 0417  WBC 4.0 4.7 5.2  NEUTROABS 2.2  --   --   HGB 9.7* 8.9* 8.9*  HCT 30.8* 28.5* 28.6*  MCV 88.0 89.6 91.1  PLT 180 172 542   Basic Metabolic Panel: Recent Labs  Lab 08/13/20 1007 08/15/20 2035 08/16/20 0417  NA 146* 147* 145  K 3.6 3.3* 3.9  CL 115* 112* 115*  CO2 24 23 24   GLUCOSE 94 132* 111*  BUN 14 19 16   CREATININE 0.76 0.76 0.78  CALCIUM 8.6* 8.8* 8.0*  MG  --  1.5* 2.6*   GFR: Estimated Creatinine Clearance: 46.6 mL/min (by C-G formula based on SCr of 0.78 mg/dL). Liver Function Tests: Recent Labs  Lab 08/13/20 1007 08/15/20 2035  AST 15 14*  ALT 14 16  ALKPHOS 68 56  BILITOT 0.5 0.5  PROT 6.0* 5.7*  ALBUMIN 3.4* 3.4*   Recent Labs  Lab 08/15/20 2035  LIPASE 24   No results for input(s): AMMONIA in the last 168 hours. Coagulation Profile: No results for input(s): INR, PROTIME in the last 168 hours. Cardiac Enzymes: No results for input(s): CKTOTAL, CKMB, CKMBINDEX, TROPONINI in the last 168 hours. BNP (last 3 results) No results for input(s): PROBNP in the last 8760 hours. HbA1C: No results for input(s): HGBA1C in the last 72 hours. CBG: No results for input(s): GLUCAP in the last 168 hours. Lipid Profile: No results  for input(s): CHOL, HDL, LDLCALC, TRIG, CHOLHDL, LDLDIRECT in the last 72 hours. Thyroid Function Tests: No results for input(s): TSH, T4TOTAL, FREET4, T3FREE, THYROIDAB in the last 72 hours. Anemia Panel: No results for input(s): VITAMINB12, FOLATE, FERRITIN, TIBC, IRON, RETICCTPCT in the last 72 hours. Sepsis Labs: No results for input(s): PROCALCITON, LATICACIDVEN in the last 168 hours.  Recent Results (from the past 240 hour(s))  Resp Panel by RT-PCR (Flu A&B, Covid) Nasopharyngeal Swab     Status: None   Collection Time: 08/16/20  1:25 AM  Specimen: Nasopharyngeal Swab; Nasopharyngeal(NP) swabs in vial transport medium  Result Value Ref Range Status   SARS Coronavirus 2 by RT PCR NEGATIVE NEGATIVE Final    Comment: (NOTE) SARS-CoV-2 target nucleic acids are NOT DETECTED.  The SARS-CoV-2 RNA is generally detectable in upper respiratory specimens during the acute phase of infection. The lowest concentration of SARS-CoV-2 viral copies this assay can detect is 138 copies/mL. A negative result does not preclude SARS-Cov-2 infection and should not be used as the sole basis for treatment or other patient management decisions. A negative result may occur with  improper specimen collection/handling, submission of specimen other than nasopharyngeal swab, presence of viral mutation(s) within the areas targeted by this assay, and inadequate number of viral copies(<138 copies/mL). A negative result must be combined with clinical observations, patient history, and epidemiological information. The expected result is Negative.  Fact Sheet for Patients:  EntrepreneurPulse.com.au  Fact Sheet for Healthcare Providers:  IncredibleEmployment.be  This test is no t yet approved or cleared by the [place] FDA and  has been authorized for detection and/or diagnosis of SARS-CoV-2 by FDA under an Emergency Use Authorization (EUA). This EUA will remain  in  effect (meaning this test can be used) for the duration of the COVID-19 declaration under Section 564(b)(1) of the Act, 21 U.S.C.section 360bbb-3(b)(1), unless the authorization is terminated  or revoked sooner.       Influenza A by PCR NEGATIVE NEGATIVE Final   Influenza B by PCR NEGATIVE NEGATIVE Final    Comment: (NOTE) The Xpert Xpress SARS-CoV-2/FLU/RSV plus assay is intended as an aid in the diagnosis of influenza from Nasopharyngeal swab specimens and should not be used as a sole basis for treatment. Nasal washings and aspirates are unacceptable for Xpert Xpress SARS-CoV-2/FLU/RSV testing.  Fact Sheet for Patients: EntrepreneurPulse.com.au  Fact Sheet for Healthcare Providers: IncredibleEmployment.be  This test is not yet approved or cleared by the [place] FDA and has been authorized for detection and/or diagnosis of SARS-CoV-2 by FDA under an Emergency Use Authorization (EUA). This EUA will remain in effect (meaning this test can be used) for the duration of the COVID-19 declaration under Section 564(b)(1) of the Act, 21 U.S.C. section 360bbb-3(b)(1), unless the authorization is terminated or revoked.  Performed at [facility], [place] [address], [place], [place] [address]   MRSA Next Gen by PCR, Nasal     Status: None   Collection Time: 08/16/20  5:36 AM   Specimen: Nasal Mucosa; Nasal Swab  Result Value Ref Range Status   MRSA by PCR Next Gen NOT DETECTED NOT DETECTED Final    Comment: (NOTE) The GeneXpert MRSA Assay (FDA approved for NASAL specimens only), is one component of a comprehensive MRSA colonization surveillance program. It is not intended to diagnose MRSA infection nor to guide or monitor treatment for MRSA infections. Test performance is not FDA approved in patients less than 87 years old. Performed at [facility], [place] [address], [place], [place] [address]       Radiology Studies: CT Angio Chest PE W/Cm &/Or Wo Cm  Result Date: 08/16/2020 CLINICAL DATA:  80 year old female with shortness of breath and abdominal pain. EXAM: CT ANGIOGRAPHY CHEST CT ABDOMEN AND PELVIS WITH CONTRAST TECHNIQUE: Multidetector CT imaging of the chest was performed using the standard protocol during bolus administration of intravenous contrast. Multiplanar CT image reconstructions and MIPs were obtained to evaluate the vascular anatomy. Multidetector CT imaging of the abdomen and pelvis was performed using the  standard protocol during bolus administration of intravenous contrast. CONTRAST:  123mL OMNIPAQUE IOHEXOL 350 MG/ML SOLN COMPARISON:  Chest radiograph dated 07/02/2020 and CT abdomen pelvis dated 08/29/2014. FINDINGS: Evaluation of this exam is limited due to respiratory motion artifact. CTA CHEST FINDINGS Cardiovascular: There is mild cardiomegaly. No pericardial effusion. There is a 3 vessel coronary vascular calcification. There is mild atherosclerotic calcification of thoracic aorta. No aneurysmal dilatation. Mildly dilated main pulmonary trunk may represent pulmonary hypertension. Clinical correlation is recommended. Evaluation of the pulmonary arteries is limited due to respiratory motion artifact and suboptimal visualization of the peripheral branches. No definite pulmonary artery embolus identified. Mediastinum/Nodes: No hilar or mediastinal adenopathy. The esophagus is grossly unremarkable. No mediastinal fluid collection. Lungs/Pleura: Small bilateral pleural effusions. There is diffuse interstitial and interlobular septal prominence and edema. There is background of centrilobular emphysema. There is a 2 cm cavitary lesion with air-fluid level in the right lower lobe which may represent fungal infection, abscess. Other etiologies including TB or neoplasm is not excluded. Clinical correlation is recommended. Several adjacent metallic clips noted. This collection therefore may  represent post bronchoscopy or biopsy changes. There is no pneumothorax. The central airways are patent. Musculoskeletal: There is osteopenia with degenerative changes of the spine. No acute osseous pathology. Review of the MIP images confirms the above findings. CT ABDOMEN and PELVIS FINDINGS No intra-abdominal free air or free fluid. Hepatobiliary: The liver is unremarkable. Mild intrahepatic biliary ductal dilatation. Cholecystectomy. Pancreas: Unremarkable. No pancreatic ductal dilatation or surrounding inflammatory changes. Spleen: Normal in size without focal abnormality. Adrenals/Urinary Tract: The adrenal glands are unremarkable. There is no hydronephrosis on either side. There is symmetric enhancement and excretion of contrast by both kidneys. The visualized ureters and urinary bladder appear unremarkable. Stomach/Bowel: There is postsurgical changes of bowel with multiple anastomotic sutures. There is no bowel obstruction or active inflammation. Appendectomy. Vascular/Lymphatic: Moderate aortoiliac atherosclerotic disease. The IVC is unremarkable. No portal venous gas. There is no adenopathy. Reproductive: Hysterectomy. No adnexal masses. Other: Midline vertical anterior abdominal wall incisional scar. There is a broad-based ventral hernia containing a segment of the transverse colon without evidence of obstruction. Musculoskeletal: Osteopenia with degenerative changes of the spine and hips. No acute osseous pathology. Review of the MIP images confirms the above findings. IMPRESSION: 1. No CT evidence of pulmonary artery embolus. 2. A 2 cm cavitary lesion with air-fluid level in the right lower lobe may represent fungal infection, abscess, or less likely malignancy. Clinical correlation and follow-up recommended. 3. Cardiomegaly with findings of CHF and small bilateral pleural effusions. 4. No acute intra-abdominal or pelvic pathology. 5. Aortic Atherosclerosis (ICD10-I70.0) and Emphysema (ICD10-J43.9).  Electronically Signed   By: Anner Crete M.D.   On: 08/16/2020 00:41   CT Abdomen Pelvis W Contrast  Result Date: 08/16/2020 CLINICAL DATA:  80 year old female with shortness of breath and abdominal pain. EXAM: CT ANGIOGRAPHY CHEST CT ABDOMEN AND PELVIS WITH CONTRAST TECHNIQUE: Multidetector CT imaging of the chest was performed using the standard protocol during bolus administration of intravenous contrast. Multiplanar CT image reconstructions and MIPs were obtained to evaluate the vascular anatomy. Multidetector CT imaging of the abdomen and pelvis was performed using the standard protocol during bolus administration of intravenous contrast. CONTRAST:  155mL OMNIPAQUE IOHEXOL 350 MG/ML SOLN COMPARISON:  Chest radiograph dated 07/02/2020 and CT abdomen pelvis dated 08/29/2014. FINDINGS: Evaluation of this exam is limited due to respiratory motion artifact. CTA CHEST FINDINGS Cardiovascular: There is mild cardiomegaly. No pericardial effusion. There is a 3 vessel coronary  vascular calcification. There is mild atherosclerotic calcification of thoracic aorta. No aneurysmal dilatation. Mildly dilated main pulmonary trunk may represent pulmonary hypertension. Clinical correlation is recommended. Evaluation of the pulmonary arteries is limited due to respiratory motion artifact and suboptimal visualization of the peripheral branches. No definite pulmonary artery embolus identified. Mediastinum/Nodes: No hilar or mediastinal adenopathy. The esophagus is grossly unremarkable. No mediastinal fluid collection. Lungs/Pleura: Small bilateral pleural effusions. There is diffuse interstitial and interlobular septal prominence and edema. There is background of centrilobular emphysema. There is a 2 cm cavitary lesion with air-fluid level in the right lower lobe which may represent fungal infection, abscess. Other etiologies including TB or neoplasm is not excluded. Clinical correlation is recommended. Several adjacent  metallic clips noted. This collection therefore may represent post bronchoscopy or biopsy changes. There is no pneumothorax. The central airways are patent. Musculoskeletal: There is osteopenia with degenerative changes of the spine. No acute osseous pathology. Review of the MIP images confirms the above findings. CT ABDOMEN and PELVIS FINDINGS No intra-abdominal free air or free fluid. Hepatobiliary: The liver is unremarkable. Mild intrahepatic biliary ductal dilatation. Cholecystectomy. Pancreas: Unremarkable. No pancreatic ductal dilatation or surrounding inflammatory changes. Spleen: Normal in size without focal abnormality. Adrenals/Urinary Tract: The adrenal glands are unremarkable. There is no hydronephrosis on either side. There is symmetric enhancement and excretion of contrast by both kidneys. The visualized ureters and urinary bladder appear unremarkable. Stomach/Bowel: There is postsurgical changes of bowel with multiple anastomotic sutures. There is no bowel obstruction or active inflammation. Appendectomy. Vascular/Lymphatic: Moderate aortoiliac atherosclerotic disease. The IVC is unremarkable. No portal venous gas. There is no adenopathy. Reproductive: Hysterectomy. No adnexal masses. Other: Midline vertical anterior abdominal wall incisional scar. There is a broad-based ventral hernia containing a segment of the transverse colon without evidence of obstruction. Musculoskeletal: Osteopenia with degenerative changes of the spine and hips. No acute osseous pathology. Review of the MIP images confirms the above findings. IMPRESSION: 1. No CT evidence of pulmonary artery embolus. 2. A 2 cm cavitary lesion with air-fluid level in the right lower lobe may represent fungal infection, abscess, or less likely malignancy. Clinical correlation and follow-up recommended. 3. Cardiomegaly with findings of CHF and small bilateral pleural effusions. 4. No acute intra-abdominal or pelvic pathology. 5. Aortic  Atherosclerosis (ICD10-I70.0) and Emphysema (ICD10-J43.9). Electronically Signed   By: Anner Crete M.D.   On: 08/16/2020 00:41    Scheduled Meds:  Chlorhexidine Gluconate Cloth  6 each Topical Daily   mouth rinse  15 mL Mouth Rinse BID   metoprolol succinate  50 mg Oral Daily   pantoprazole (PROTONIX) IV  40 mg Intravenous Q12H   Continuous Infusions:  sodium chloride       LOS: 0 days   Marylu Lund, MD [facility] Pager On Amion  If 7PM-7AM, please contact night-coverage 08/16/2020, 3:06 PM

## 2020-08-16 NOTE — TOC Initial Note (Signed)
Transition of Care [facility]) - Initial/Assessment Note    Patient Details  Name: [name] MRN: [id number] Date of Birth: 06-Aug-1940  Transition of Care [facility]) CM/SW Contact:    Leeroy Cha, RN Phone Number: 08/16/2020, 7:19 AM  Clinical Narrative:                  80 y.o. female with medical history significant for Barrett esophagus, CAD, chronic diastolic CHF, remote DVT no longer anticoagulated, history of bowel obstruction status post partial colectomy/colostomy and subsequent takedown, history of CVA, and non-small cell lung cancer diagnosed in June 2022 undergoing chemotherapy and radiation, now presenting to the emergency department with epigastric pain.  The patient reports that she had been doing fairly well, felt that she has been tolerating radiation and chemotherapy well, but then woke up 2 nights ago with epigastric pain that has persisted.  Pain has been constant, stabbing in character, severe at times, and without any alleviating or exacerbating factors identified.  She does not remember experiencing similar pain previously.  She has been more short of breath than usual recently but denies any change in her chronic nonproductive cough.  She has not had any fevers or chills.  She denies vomiting, diarrhea, melena, or hematochezia.  She denies chest pain and had not been experiencing any palpitations until she was in the ED.     ED Course: Upon arrival to the ED, patient is found to be afebrile, saturating well on room air, and with heart rate reaching 150s.  EKG features supraventricular tachycardia.  CTA chest is negative for PE but notable for 2 cm cavitary lesion in the right lower lobe.  CT of the abdomen and pelvis is negative for acute findings.  Chemistry panel notable for sodium 147, potassium 3.3, and magnesium 1.5.  CBC with hemoglobin 8.9.  Troponin is normal.  COVID-19 negative.  Fecal occult blood testing is positive.  Patient was treated with adenosine, magnesium,  potassium, IV Lopressor, Pepcid, and 500 cc of normal saline in the ED. TOC PLAN OF CARE: pt is from home with husband, plan is to returbn to home with self care. Will follow for progression of care. Expected Discharge Plan: Home/Self Care Barriers to Discharge: Continued Medical Work up   Patient Goals and CMS Choice        Expected Discharge Plan and Services Expected Discharge Plan: Home/Self Care   Discharge Planning Services: CM Consult   Living arrangements for the past 2 months: Single Family Home                                      Prior Living Arrangements/Services Living arrangements for the past 2 months: Single Family Home Lives with:: Spouse Patient language and need for interpreter reviewed:: Yes Do you feel safe going back to the place where you live?: Yes            Criminal Activity/Legal Involvement Pertinent to Current Situation/Hospitalization: No - Comment as needed  Activities of Daily Living Home Assistive Devices/Equipment: None ADL Screening (condition at time of admission) Patient's cognitive ability adequate to safely complete daily activities?: No Is the patient deaf or have difficulty hearing?: No Does the patient have difficulty seeing, even when wearing glasses/contacts?: Yes Does the patient have difficulty concentrating, remembering, or making decisions?: No Patient able to express need for assistance with ADLs?: No Does the patient have difficulty dressing  or bathing?: Yes Independently performs ADLs?: Yes (appropriate for developmental age) Does the patient have difficulty walking or climbing stairs?: Yes Weakness of Legs: None Weakness of Arms/Hands: None  Permission Sought/Granted                  Emotional Assessment Appearance:: Appears stated age     Orientation: : Oriented to Self, Oriented to Place, Oriented to  Time, Oriented to Situation Alcohol / Substance Use: Not Applicable Psych Involvement: No  (comment)  Admission diagnosis:  Hypokalemia [E87.6] Hypomagnesemia [E83.42] SVT (supraventricular tachycardia) (HCC) [I47.1] Epigastric abdominal pain [R10.13] Anemia, unspecified type [D64.9] Patient Active Problem List   Diagnosis Date Noted   SVT (supraventricular tachycardia) ([place]) 08/16/2020   Occult GI bleeding 08/16/2020   Malignant neoplasm of bronchus of right lower lobe ([place]) 07/10/2020   Encounter for antineoplastic chemotherapy 07/10/2020   Pulmonary nodule 1 cm or greater in diameter 06/21/2020   COPD (chronic obstructive pulmonary disease) ([place]) 06/21/2020   Degenerative disc disease, lumbar 06/19/2020   Degenerative disc disease, cervical 06/19/2020   Type 2 diabetes mellitus with complication, without long-term current use of insulin ([place]) 01/11/2019   NSTEMI (non-ST elevated myocardial infarction) ([place]) 01/08/2019   Carotid stenosis 10/23/2014   S/P carotid endarterectomy 10/23/2014   Cerebrovascular accident (CVA) due to embolism of right middle cerebral artery ([place]) 10/23/2014   Subacromial impingement of left shoulder 10/02/2014   Adhesive capsulitis of left shoulder 10/02/2014   Other impaction of intestine ([place])    Left hemiparesis ([place]) 08/13/2014   Acute right arterial ischemic stroke, middle cerebral artery (MCA) ([place]) 08/11/2014   Carotid artery stenosis, symptomatic 08/08/2014   Carotid disease, bilateral ([place]) 06/02/2014   Coronary artery disease due to lipid rich plaque 06/02/2014   Essential hypertension 06/02/2014   Hyperlipidemia 06/02/2014   Acute respiratory failure ([place])    S/P colostomy takedown 01/24/2014   Acute on chronic diastolic CHF (congestive heart failure) ([place]) 11/10/2013   Atypical chest pain 10/20/2013   Malnutrition of moderate degree ([place]) 09/18/2013   SBO (small bowel obstruction) ([place]) 09/17/2013   H/O tracheostomy 09/17/2013   Acute respiratory failure with hypoxia s/p tracheostomy 08/19/2013   Physical deconditioning  08/19/2013   HTN (hypertension) 08/19/2013   Dehydration with hypernatremia 08/19/2013   DVT of axillary vein, acute right ([place]) 08/19/2013   Anemia 08/19/2013   Anasarca 08/14/2013   Hypokalemia 07/31/2013   Hypomagnesemia 07/31/2013   Protein-calorie malnutrition, severe w/ electrolyte imbalance 07/31/2013   Colonic obstruction due to suspected colitis; s/p colectomy/colostomy 07/29/2013   CAD (coronary artery disease)    Depression    Barrett's esophagus    Arthritis    Other and unspecified angina pectoris 12/20/2012   Chest pain 12/14/2012   TOBACCO ABUSE 11/08/2009   Hyperlipidemia, mixed 04/17/2008   PCP:  [name], MD Pharmacy:   [facility]  [facility]) - [place], [place] - [address] [place] [address] Phone: [phone] Fax: [phone]     Social Determinants of Health (SDOH) Interventions    Readmission Risk Interventions No flowsheet data found.

## 2020-08-16 NOTE — Consult Note (Signed)
Referring Provider: [facility] Primary Care Physician:  [name], MD Primary Gastroenterologist: Althia Forts (Dr. Toney Rakes)  Reason for Consultation:  Epigastric pain, anemia  HPI: [name] is a 80 y.o. female with past medical history of  Barrett esophagus, CAD, chronic diastolic CHF, remote DVT no longer anticoagulated, bowel obstruction status post partial colectomy/colostomy and subsequent takedown, history of CVA, and non-small cell lung cancer (dx 06/2020), on chemoradiation presenting for consultation of epigastric abdominal pain and anemia.  Patient states she went for chemoradiation yesterday and upon returning home started having some epigastric abdominal pain.  Pain worsened in severity and was 10 out of 10, so she presented to the ED.  She was in sinus tachycardia upon arrival.  She denies any associated nausea or vomiting.  She reports chronic dysphagia for years.  Reports loose green/yellow diarrhea since starting chemotherapy, 2-3 stools per day.  Denies any melena or hematochezia.  Takes 81 mg ASA daily. Denies NSAID or blood thinner use.  CT abdomen with contrast 7/28: A 2 cm cavitary lesion with air-fluid level in the right lower lobe may represent fungal infection, abscess, or less likely malignancy. Cardiomegaly with findings of CHF and small bilateral pleural effusions. No acute intra-abdominal or pelvic pathology.  Last EGD (Dr. Melina Copa) 02/2019: focal gastritis without bleeding, Barrett's esophagus  Past Medical History:  Diagnosis Date   Acute respiratory failure with hypoxia s/p tracheostomy 07/20/2013   Anemia    a. 7-08/2013 felt due to recent critical illness/chronic disease.   Arthritis    handds, knees & back    Barrett's esophagus    CAD (coronary artery disease)    a. Mod dz 2010 initially mgd medically. b. 12/2012 - angina s/p PTCA/DES to mid-circumflex, PTCA/DES to first OM.    Carotid artery disease ([place])    a. 60-70% bilat ICA stenosis  by dopplers 05/2012.   Chronic back pain    deteriorating;DDD   Colonic obstruction due to suspected colitis; s/p colectomy/colostomy    a. See SBO.   Complication of anesthesia    hard to wake up from anesthesia    COPD (chronic obstructive pulmonary disease) (HCC)    Depression    takes Remeron and Zoloft daily   Dizziness    was on Bentyl which caused this-took off of it and no problems since   DVT of axillary vein, acute right ([place])    a. Dx 07/2013 felt due to R IJ central line that had been inserted 7/14. Not on anticoag due to GIB issues and small cerebral bleed.   GERD (gastroesophageal reflux disease)    Heart murmur    History of bronchitis    > 14yrs ago    History of colon polyps    History of hiatal hernia    History of kidney stones    was told it was stable but doesn't know for sure that she ever passed it   HTN (hypertension)    Hypertension    takes Metoprolol daily   Intracranial hemorrhage ([place])    a. 08/11/13 - per DC summary, tiny SAH vs SDH done for altered mentation, anticoag stopped including aspirin.   Joint pain    Neck pain    DDD   Other and unspecified hyperlipidemia    takes Lipitor daily   Pneumonia    hx of->15 yrs ago   Protein-calorie malnutrition, severe w/ electrolyte imbalance    a. Severe hypoalbuminemia leading to 3rd spacing including anasarca and pulm edema 07/2013.  SBO (small bowel obstruction) ([place])    a. Lysis of adhesions and ovarian cystectomy 04/2013 for sBO. b. Admitted 07/2013 for colonic obstruction due to suspected colitis, s/p partial colectomy/colostomy 08/01/13. Admission complicated by anasarca, acute resp failure requiring tracheostomy, decannulated 08/25/13. c. Recurrent SBO8/2015, NGT placed for decompression.    Stroke [facility]) early 80's   right sided weakness--TIA    Past Surgical History:  Procedure Laterality Date   ABDOMINAL HYSTERECTOMY     APPENDECTOMY     BOWEL RESECTION N/A 08/01/2013   Procedure: SMALL BOWEL  RESECTION;  Surgeon: Harl Bowie, MD;  Location: [place];  Service: General;  Laterality: N/A;   BRONCHIAL BIOPSY  07/02/2020   Procedure: BRONCHIAL BIOPSIES;  Surgeon: Collene Gobble, MD;  Location: [facility] ENDOSCOPY;  Service: Pulmonary;;   BRONCHIAL BRUSHINGS  07/02/2020   Procedure: BRONCHIAL BRUSHINGS;  Surgeon: Collene Gobble, MD;  Location[facility] ENDOSCOPY;  Service: Pulmonary;;   BRONCHIAL NEEDLE ASPIRATION BIOPSY  07/02/2020   Procedure: BRONCHIAL NEEDLE ASPIRATION BIOPSIES;  Surgeon: Collene Gobble, MD;  Location: [place];  Service: Pulmonary;;   BRONCHIAL WASHINGS  07/02/2020   Procedure: BRONCHIAL WASHINGS;  Surgeon: Collene Gobble, MD;  Location: [place] ENDOSCOPY;  Service: Pulmonary;;   cataract surgery Bilateral    CHOLECYSTECTOMY     COLON RESECTION     COLON SURGERY     COLOSTOMY N/A 08/01/2013   Procedure: COLOSTOMY;  Surgeon: Harl Bowie, MD;  Location: [place];  Service: General;  Laterality: N/A;   COLOSTOMY TAKEDOWN  01/24/2014   dr blackman   COLOSTOMY TAKEDOWN N/A 01/24/2014   Procedure: COLOSTOMY TAKEDOWN;  Surgeon: Coralie Keens, MD;  Location: [place];  Service: General;  Laterality: N/A;   CORONARY ANGIOPLASTY WITH STENT PLACEMENT  12/20/2012   STENT TO OM         DR COOPER   ENDARTERECTOMY Right 08/08/2014   Procedure: RIGHT CAROTID ENDARTERECTOMY WITH HEMASHIELD PATCH ANGIOPLASTY;  Surgeon: Elam Dutch, MD;  Location: [place];  Service: Vascular;  Laterality: Right;   FIDUCIAL MARKER PLACEMENT  07/02/2020   Procedure: FIDUCIAL MARKER PLACEMENT;  Surgeon: Collene Gobble, MD;  Location: [place] ENDOSCOPY;  Service: Pulmonary;;   FOOT SURGERY Left    HAND SURGERY Bilateral    for ganglion cysts   HEMOSTASIS CONTROL  07/02/2020   Procedure: HEMOSTASIS CONTROL;  Surgeon: Collene Gobble, MD;  Location: [place] ENDOSCOPY;  Service: Pulmonary;;  ice saline and epi   LAPAROTOMY N/A 08/01/2013   Procedure: EXPLORATORY LAPAROTOMY;  Surgeon: Harl Bowie, MD;  Location:  [place];  Service: General;  Laterality: N/A;   LEFT HEART CATH AND CORONARY ANGIOGRAPHY N/A 01/10/2019   Procedure: LEFT HEART CATH AND CORONARY ANGIOGRAPHY;  Surgeon: Troy Sine, MD;  Location: [place] CV LAB;  Service: Cardiovascular;  Laterality: N/A;   LEFT HEART CATHETERIZATION WITH CORONARY ANGIOGRAM N/A 12/20/2012   Procedure: LEFT HEART CATHETERIZATION WITH CORONARY ANGIOGRAM;  Surgeon: Blane Ohara, MD;  Location: [facility] CATH LAB;  Service: Cardiovascular;  Laterality: N/A;   PARTIAL COLECTOMY N/A 08/01/2013   Procedure: PARTIAL COLECTOMY;  Surgeon: Harl Bowie, MD;  Location: [place];  Service: General;  Laterality: N/A;   ROTATOR CUFF REPAIR Left    TONSILLECTOMY     TRACHEOSTOMY     feinstein   VIDEO BRONCHOSCOPY WITH ENDOBRONCHIAL NAVIGATION N/A 07/02/2020   Procedure: VIDEO BRONCHOSCOPY WITH ENDOBRONCHIAL NAVIGATION;  Surgeon: Collene Gobble, MD;  Location: [place] ENDOSCOPY;  Service: Pulmonary;  Laterality: N/A;    Prior to Admission medications   Medication Sig Start Date End Date Taking? Authorizing Provider  aspirin EC 81 MG tablet Take 2 tablets (162 mg total) by mouth in the morning. Okay to restart this medication on 07/04/2020 07/02/20  Yes Collene Gobble, MD  atorvastatin (LIPITOR) 80 MG tablet Take 80 mg by mouth every evening.    Yes [provider]  buPROPion (WELLBUTRIN XL) 150 MG 24 hr tablet Take 1 tablet (150 mg total) by mouth daily. 03/02/15  Yes Dorothy Spark, MD  CALCIUM PO Take 1 tablet by mouth in the morning.   Yes [provider]  Cholecalciferol (VITAMIN D3 PO) Take 1 tablet by mouth in the morning.   Yes [provider]  fish oil-omega-3 fatty acids 1000 MG capsule Take 1,000 mg by mouth in the morning.   Yes [provider]  fluticasone (FLONASE) 50 MCG/ACT nasal spray Place 1 spray into both nostrils daily as needed for allergies. 11/18/18  Yes [provider]  folic acid (FOLVITE) 967 MCG tablet  Take 800 mcg by mouth in the morning.   Yes [provider]  lidocaine-prilocaine (EMLA) cream Apply 1 application topically as needed. Patient taking differently: Apply 1 application topically as needed (pain). 08/13/20  Yes Heilingoetter, Cassandra L, PA-C  metoprolol succinate (TOPROL-XL) 50 MG 24 hr tablet TAKE 1 TABLET BY MOUTH  DAILY Patient taking differently: Take 50 mg by mouth daily. 12/21/19  Yes Camnitz, Will Hassell Done, MD  mirtazapine (REMERON) 45 MG tablet Take 45 mg by mouth at bedtime.  06/29/18  Yes [provider]  nitroGLYCERIN (NITROSTAT) 0.4 MG SL tablet DISSOLVE 1 TABLET UNDER THE TONGUE EVERY 5 MINUTES AS  NEEDED FOR CHEST PAIN. MAX  OF 3 TABLETS IN 15 MINUTES. CALL 911 IF PAIN PERSISTS. Patient taking differently: Place 0.4 mg under the tongue every 5 (five) minutes x 3 doses as needed for chest pain. 06/04/20  Yes Nahser, Wonda Cheng, MD  ondansetron (ZOFRAN) 4 MG tablet Take 4 mg by mouth every 8 (eight) hours as needed for nausea or vomiting.    Yes [provider]  pantoprazole (PROTONIX) 40 MG tablet Take 40 mg by mouth in the morning.   Yes [provider]  sertraline (ZOLOFT) 100 MG tablet Take 200 mg by mouth in the morning. 06/27/14  Yes [provider]  prochlorperazine (COMPAZINE) 10 MG tablet Take 1 tablet (10 mg total) by mouth every 6 (six) hours as needed for nausea or vomiting. 07/27/20   Curt Bears, MD    Scheduled Meds:  Chlorhexidine Gluconate Cloth  6 each Topical Daily   mouth rinse  15 mL Mouth Rinse BID   metoprolol succinate  50 mg Oral Daily   pantoprazole (PROTONIX) IV  40 mg Intravenous Q24H   Continuous Infusions:  sodium chloride     PRN Meds:.sodium chloride, acetaminophen **OR** acetaminophen, metoprolol tartrate, ondansetron **OR** ondansetron (ZOFRAN) IV  Allergies as of 08/15/2020 - Review Complete 08/15/2020  Allergen Reaction Noted   Pregabalin Swelling    Sulfonamide derivatives Swelling      Family History  Problem Relation Age of Onset   Varicose Veins Mother    Heart disease Father    Heart attack Father    AAA (abdominal aortic aneurysm) Father    Heart disease Other     Social History   Socioeconomic History   Marital status: Married    Spouse name: Not on file   Number  of children: Not on file   Years of education: Not on file   Highest education level: Not on file  Occupational History   Occupation: full time  Tobacco Use   Smoking status: Former    Packs/day: 0.25    Years: 53.00    Pack years: 13.25    Types: Cigarettes   Smokeless tobacco: Never   Tobacco comments:    Stopped smoking in May 2022. ARJ 08/10/20  Vaping Use   Vaping Use: Never used  Substance and Sexual Activity   Alcohol use: No    Alcohol/week: 0.0 standard drinks   Drug use: No   Sexual activity: Not Currently    Birth control/protection: Surgical    Comment: Hysterectomy  Other Topics Concern   Not on file  Social History Narrative   Not on file   Social Determinants of Health   Financial Resource Strain: Not on file  Food Insecurity: Not on file  Transportation Needs: Not on file  Physical Activity: Not on file  Stress: Not on file  Social Connections: Not on file  Intimate Partner Violence: Not At Risk   Fear of Current or Ex-Partner: No   Emotionally Abused: No   Physically Abused: No   Sexually Abused: No    Review of Systems: Review of Systems  Constitutional:  Negative for chills and fever.  HENT:  Negative for sore throat.   Eyes:  Negative for pain and redness.  Respiratory:  Negative for cough, shortness of breath and stridor.   Cardiovascular:  Negative for chest pain and palpitations.  Gastrointestinal:  Positive for abdominal pain and diarrhea. Negative for blood in stool, constipation, heartburn, melena, nausea and vomiting.  Genitourinary:  Negative for flank pain and hematuria.  Musculoskeletal:  Negative for falls and joint pain.  Skin:   Negative for itching and rash.  Neurological:  Negative for seizures and loss of consciousness.  Psychiatric/Behavioral:  Negative for substance abuse. The patient is not nervous/anxious.     Physical Exam: Vital signs: Vitals:   08/16/20 0600 08/16/20 0900  BP: (!) 112/50   Pulse: (!) 116   Resp: (!) 30   Temp:  98 F (36.7 C)  SpO2: 97%       Physical Exam Vitals reviewed.  Constitutional:      General: She is not in acute distress.    Interventions: Nasal cannula in place.  HENT:     Head: Normocephalic and atraumatic.     Nose: Nose normal. No congestion.     Mouth/Throat:     Mouth: Mucous membranes are moist.     Pharynx: Oropharynx is clear.  Eyes:     Extraocular Movements: Extraocular movements intact.     Conjunctiva/sclera: Conjunctivae normal.  Cardiovascular:     Rate and Rhythm: Normal rate and regular rhythm.  Pulmonary:     Effort: Pulmonary effort is normal. No respiratory distress.  Abdominal:     General: Bowel sounds are normal. There is no distension.     Palpations: Abdomen is soft. There is no mass.     Tenderness: There is abdominal tenderness (mild, epigastric). There is no guarding or rebound.     Hernia: No hernia is present.  Musculoskeletal:        General: No swelling or tenderness.     Cervical back: Normal range of motion and neck supple.  Skin:    General: Skin is warm and dry.  Neurological:     General: No focal deficit present.  Mental Status: She is alert and oriented to person, place, and time.  Psychiatric:        Mood and Affect: Mood normal.        Behavior: Behavior normal. Behavior is cooperative.    GI:  Lab Results: Recent Labs    08/13/20 1007 08/15/20 2035 08/16/20 0417  WBC 4.0 4.7 5.2  HGB 9.7* 8.9* 8.9*  HCT 30.8* 28.5* 28.6*  PLT 180 172 173   BMET Recent Labs    08/13/20 1007 08/15/20 2035 08/16/20 0417  NA 146* 147* 145  K 3.6 3.3* 3.9  CL 115* 112* 115*  CO2 24 23 24   GLUCOSE 94 132*  111*  BUN 14 19 16   CREATININE 0.76 0.76 0.78  CALCIUM 8.6* 8.8* 8.0*   LFT Recent Labs    08/15/20 2035  PROT 5.7*  ALBUMIN 3.4*  AST 14*  ALT 16  ALKPHOS 56  BILITOT 0.5   PT/INR No results for input(s): LABPROT, INR in the last 72 hours.   Studies/Results: CT Angio Chest PE W/Cm &/Or Wo Cm  Result Date: 08/16/2020 CLINICAL DATA:  80 year old female with shortness of breath and abdominal pain. EXAM: CT ANGIOGRAPHY CHEST CT ABDOMEN AND PELVIS WITH CONTRAST TECHNIQUE: Multidetector CT imaging of the chest was performed using the standard protocol during bolus administration of intravenous contrast. Multiplanar CT image reconstructions and MIPs were obtained to evaluate the vascular anatomy. Multidetector CT imaging of the abdomen and pelvis was performed using the standard protocol during bolus administration of intravenous contrast. CONTRAST:  127mL OMNIPAQUE IOHEXOL 350 MG/ML SOLN COMPARISON:  Chest radiograph dated 07/02/2020 and CT abdomen pelvis dated 08/29/2014. FINDINGS: Evaluation of this exam is limited due to respiratory motion artifact. CTA CHEST FINDINGS Cardiovascular: There is mild cardiomegaly. No pericardial effusion. There is a 3 vessel coronary vascular calcification. There is mild atherosclerotic calcification of thoracic aorta. No aneurysmal dilatation. Mildly dilated main pulmonary trunk may represent pulmonary hypertension. Clinical correlation is recommended. Evaluation of the pulmonary arteries is limited due to respiratory motion artifact and suboptimal visualization of the peripheral branches. No definite pulmonary artery embolus identified. Mediastinum/Nodes: No hilar or mediastinal adenopathy. The esophagus is grossly unremarkable. No mediastinal fluid collection. Lungs/Pleura: Small bilateral pleural effusions. There is diffuse interstitial and interlobular septal prominence and edema. There is background of centrilobular emphysema. There is a 2 cm cavitary lesion  with air-fluid level in the right lower lobe which may represent fungal infection, abscess. Other etiologies including TB or neoplasm is not excluded. Clinical correlation is recommended. Several adjacent metallic clips noted. This collection therefore may represent post bronchoscopy or biopsy changes. There is no pneumothorax. The central airways are patent. Musculoskeletal: There is osteopenia with degenerative changes of the spine. No acute osseous pathology. Review of the MIP images confirms the above findings. CT ABDOMEN and PELVIS FINDINGS No intra-abdominal free air or free fluid. Hepatobiliary: The liver is unremarkable. Mild intrahepatic biliary ductal dilatation. Cholecystectomy. Pancreas: Unremarkable. No pancreatic ductal dilatation or surrounding inflammatory changes. Spleen: Normal in size without focal abnormality. Adrenals/Urinary Tract: The adrenal glands are unremarkable. There is no hydronephrosis on either side. There is symmetric enhancement and excretion of contrast by both kidneys. The visualized ureters and urinary bladder appear unremarkable. Stomach/Bowel: There is postsurgical changes of bowel with multiple anastomotic sutures. There is no bowel obstruction or active inflammation. Appendectomy. Vascular/Lymphatic: Moderate aortoiliac atherosclerotic disease. The IVC is unremarkable. No portal venous gas. There is no adenopathy. Reproductive: Hysterectomy. No adnexal masses. Other: Midline vertical anterior  abdominal wall incisional scar. There is a broad-based ventral hernia containing a segment of the transverse colon without evidence of obstruction. Musculoskeletal: Osteopenia with degenerative changes of the spine and hips. No acute osseous pathology. Review of the MIP images confirms the above findings. IMPRESSION: 1. No CT evidence of pulmonary artery embolus. 2. A 2 cm cavitary lesion with air-fluid level in the right lower lobe may represent fungal infection, abscess, or less likely  malignancy. Clinical correlation and follow-up recommended. 3. Cardiomegaly with findings of CHF and small bilateral pleural effusions. 4. No acute intra-abdominal or pelvic pathology. 5. Aortic Atherosclerosis (ICD10-I70.0) and Emphysema (ICD10-J43.9). Electronically Signed   By: Anner Crete M.D.   On: 08/16/2020 00:41   CT Abdomen Pelvis W Contrast  Result Date: 08/16/2020 CLINICAL DATA:  80 year old female with shortness of breath and abdominal pain. EXAM: CT ANGIOGRAPHY CHEST CT ABDOMEN AND PELVIS WITH CONTRAST TECHNIQUE: Multidetector CT imaging of the chest was performed using the standard protocol during bolus administration of intravenous contrast. Multiplanar CT image reconstructions and MIPs were obtained to evaluate the vascular anatomy. Multidetector CT imaging of the abdomen and pelvis was performed using the standard protocol during bolus administration of intravenous contrast. CONTRAST:  194mL OMNIPAQUE IOHEXOL 350 MG/ML SOLN COMPARISON:  Chest radiograph dated 07/02/2020 and CT abdomen pelvis dated 08/29/2014. FINDINGS: Evaluation of this exam is limited due to respiratory motion artifact. CTA CHEST FINDINGS Cardiovascular: There is mild cardiomegaly. No pericardial effusion. There is a 3 vessel coronary vascular calcification. There is mild atherosclerotic calcification of thoracic aorta. No aneurysmal dilatation. Mildly dilated main pulmonary trunk may represent pulmonary hypertension. Clinical correlation is recommended. Evaluation of the pulmonary arteries is limited due to respiratory motion artifact and suboptimal visualization of the peripheral branches. No definite pulmonary artery embolus identified. Mediastinum/Nodes: No hilar or mediastinal adenopathy. The esophagus is grossly unremarkable. No mediastinal fluid collection. Lungs/Pleura: Small bilateral pleural effusions. There is diffuse interstitial and interlobular septal prominence and edema. There is background of  centrilobular emphysema. There is a 2 cm cavitary lesion with air-fluid level in the right lower lobe which may represent fungal infection, abscess. Other etiologies including TB or neoplasm is not excluded. Clinical correlation is recommended. Several adjacent metallic clips noted. This collection therefore may represent post bronchoscopy or biopsy changes. There is no pneumothorax. The central airways are patent. Musculoskeletal: There is osteopenia with degenerative changes of the spine. No acute osseous pathology. Review of the MIP images confirms the above findings. CT ABDOMEN and PELVIS FINDINGS No intra-abdominal free air or free fluid. Hepatobiliary: The liver is unremarkable. Mild intrahepatic biliary ductal dilatation. Cholecystectomy. Pancreas: Unremarkable. No pancreatic ductal dilatation or surrounding inflammatory changes. Spleen: Normal in size without focal abnormality. Adrenals/Urinary Tract: The adrenal glands are unremarkable. There is no hydronephrosis on either side. There is symmetric enhancement and excretion of contrast by both kidneys. The visualized ureters and urinary bladder appear unremarkable. Stomach/Bowel: There is postsurgical changes of bowel with multiple anastomotic sutures. There is no bowel obstruction or active inflammation. Appendectomy. Vascular/Lymphatic: Moderate aortoiliac atherosclerotic disease. The IVC is unremarkable. No portal venous gas. There is no adenopathy. Reproductive: Hysterectomy. No adnexal masses. Other: Midline vertical anterior abdominal wall incisional scar. There is a broad-based ventral hernia containing a segment of the transverse colon without evidence of obstruction. Musculoskeletal: Osteopenia with degenerative changes of the spine and hips. No acute osseous pathology. Review of the MIP images confirms the above findings. IMPRESSION: 1. No CT evidence of pulmonary artery embolus. 2. A 2 cm  cavitary lesion with air-fluid level in the right lower  lobe may represent fungal infection, abscess, or less likely malignancy. Clinical correlation and follow-up recommended. 3. Cardiomegaly with findings of CHF and small bilateral pleural effusions. 4. No acute intra-abdominal or pelvic pathology. 5. Aortic Atherosclerosis (ICD10-I70.0) and Emphysema (ICD10-J43.9). Electronically Signed   By: Anner Crete M.D.   On: 08/16/2020 00:41    Impression: Epigastric abdominal pain.  Suspect gastric etiology.  Normal LFTs as of 7/27  Acute on chronic anemia: Hgb 8.9, decreased from 9.7 three days prior.  Heme-positive stool without overt bleeding. Normal BUN/Cr.   Diarrhea, possibly due to chemoradiation vs. Infectious due to immunosuppression  History of Barrett's esophagus  Plan: Given recent endoscopy in 10/2019, unrevealing CT, and absence of overt bleeding, recommend medical management at this time.  Increase Protonix to BID dosing. Start Carafate BID.  H pylori serologies.  GI pathogen and C diff testing.  Clear liquid diet, advance as tolerated.  [facility] will follow.   LOS: 0 days   Salley Slaughter  PA-C 08/16/2020, 9:27 AM  Contact #  [phone]

## 2020-08-16 NOTE — ED Notes (Signed)
Pt assisted to bedside commode for a bm and HR went up to 175. Pt assisted back to bed where HR still fluctuating between 148 and 168. Pt advised to take deep slow breaths and to try to relax. Magnesium infusing.

## 2020-08-16 NOTE — Progress Notes (Addendum)
This RN entered room with oncology transport to assist patient into wheelchair for treatment. Patient's heartrate increased to 150s sustained. RN instructed patient on vagal stimulation with effect dropping heartrate to 125. 2.5 mg metopralol IV given, heart rate decreased to 110 before leaving patient's room. Radiation oncology department contacted and notified that no treatment will occur today. Will continue to monitor for cardiac changes.

## 2020-08-16 NOTE — ED Notes (Signed)
Patient transported to CT 

## 2020-08-16 NOTE — ED Notes (Signed)
purewick placed on pt.

## 2020-08-16 NOTE — Plan of Care (Signed)
ongoing

## 2020-08-16 NOTE — ED Notes (Signed)
Pt given ginger ale. Awaiting bed assignment on the floor.

## 2020-08-17 ENCOUNTER — Ambulatory Visit
Admission: RE | Admit: 2020-08-17 | Discharge: 2020-08-17 | Disposition: A | Discharge status: Home / Self Care | Payer: Medicare Other | Source: Ambulatory Visit | Attending: Radiation Oncology | Admitting: Radiation Oncology

## 2020-08-17 DIAGNOSIS — Z79899 Other long term (current) drug therapy: Secondary | ICD-10-CM | POA: Diagnosis not present

## 2020-08-17 DIAGNOSIS — K922 Gastrointestinal hemorrhage, unspecified: Secondary | ICD-10-CM | POA: Diagnosis present

## 2020-08-17 DIAGNOSIS — Z9049 Acquired absence of other specified parts of digestive tract: Secondary | ICD-10-CM | POA: Diagnosis not present

## 2020-08-17 DIAGNOSIS — Z955 Presence of coronary angioplasty implant and graft: Secondary | ICD-10-CM | POA: Diagnosis not present

## 2020-08-17 DIAGNOSIS — Z86010 Personal history of colonic polyps: Secondary | ICD-10-CM | POA: Diagnosis not present

## 2020-08-17 DIAGNOSIS — K219 Gastro-esophageal reflux disease without esophagitis: Secondary | ICD-10-CM | POA: Diagnosis present

## 2020-08-17 DIAGNOSIS — Z8673 Personal history of transient ischemic attack (TIA), and cerebral infarction without residual deficits: Secondary | ICD-10-CM | POA: Diagnosis not present

## 2020-08-17 DIAGNOSIS — Z90710 Acquired absence of both cervix and uterus: Secondary | ICD-10-CM | POA: Diagnosis not present

## 2020-08-17 DIAGNOSIS — Z20822 Contact with and (suspected) exposure to covid-19: Secondary | ICD-10-CM | POA: Diagnosis present

## 2020-08-17 DIAGNOSIS — Z8249 Family history of ischemic heart disease and other diseases of the circulatory system: Secondary | ICD-10-CM | POA: Diagnosis not present

## 2020-08-17 DIAGNOSIS — I471 Supraventricular tachycardia: Secondary | ICD-10-CM | POA: Diagnosis present

## 2020-08-17 DIAGNOSIS — E876 Hypokalemia: Secondary | ICD-10-CM | POA: Diagnosis present

## 2020-08-17 DIAGNOSIS — I110 Hypertensive heart disease with heart failure: Secondary | ICD-10-CM | POA: Diagnosis present

## 2020-08-17 DIAGNOSIS — I5033 Acute on chronic diastolic (congestive) heart failure: Secondary | ICD-10-CM | POA: Diagnosis present

## 2020-08-17 DIAGNOSIS — Z888 Allergy status to other drugs, medicaments and biological substances status: Secondary | ICD-10-CM | POA: Diagnosis not present

## 2020-08-17 DIAGNOSIS — C3431 Malignant neoplasm of lower lobe, right bronchus or lung: Secondary | ICD-10-CM | POA: Diagnosis present

## 2020-08-17 DIAGNOSIS — F32A Depression, unspecified: Secondary | ICD-10-CM | POA: Diagnosis present

## 2020-08-17 DIAGNOSIS — D638 Anemia in other chronic diseases classified elsewhere: Secondary | ICD-10-CM | POA: Diagnosis present

## 2020-08-17 DIAGNOSIS — Z882 Allergy status to sulfonamides status: Secondary | ICD-10-CM | POA: Diagnosis not present

## 2020-08-17 DIAGNOSIS — I2510 Atherosclerotic heart disease of native coronary artery without angina pectoris: Secondary | ICD-10-CM | POA: Diagnosis present

## 2020-08-17 DIAGNOSIS — Z86718 Personal history of other venous thrombosis and embolism: Secondary | ICD-10-CM | POA: Diagnosis not present

## 2020-08-17 DIAGNOSIS — Z87442 Personal history of urinary calculi: Secondary | ICD-10-CM | POA: Diagnosis not present

## 2020-08-17 DIAGNOSIS — Z7982 Long term (current) use of aspirin: Secondary | ICD-10-CM | POA: Diagnosis not present

## 2020-08-17 LAB — CBC
HCT: 28.9 % — ABNORMAL LOW (ref 36.0–46.0)
Hemoglobin: 9.4 g/dL — ABNORMAL LOW (ref 12.0–15.0)
MCH: 28.5 pg (ref 26.0–34.0)
MCHC: 32.5 g/dL (ref 30.0–36.0)
MCV: 87.6 fL (ref 80.0–100.0)
Platelets: 130 10*3/uL — ABNORMAL LOW (ref 150–400)
RBC: 3.3 MIL/uL — ABNORMAL LOW (ref 3.87–5.11)
RDW: 16.4 % — ABNORMAL HIGH (ref 11.5–15.5)
WBC: 4.6 10*3/uL (ref 4.0–10.5)
nRBC: 0 % (ref 0.0–0.2)

## 2020-08-17 LAB — BASIC METABOLIC PANEL
Anion gap: 6 (ref 5–15)
BUN: 14 mg/dL (ref 8–23)
CO2: 25 mmol/L (ref 22–32)
Calcium: 8.5 mg/dL — ABNORMAL LOW (ref 8.9–10.3)
Chloride: 109 mmol/L (ref 98–111)
Creatinine, Ser: 0.64 mg/dL (ref 0.44–1.00)
GFR, Estimated: >60 mL/min (ref >60)
Glucose, Bld: 108 mg/dL — ABNORMAL HIGH (ref 70–99)
Potassium: 3.9 mmol/L (ref 3.5–5.1)
Sodium: 140 mmol/L (ref 135–145)

## 2020-08-17 LAB — MAGNESIUM: Magnesium: 1.8 mg/dL (ref 1.7–2.4)

## 2020-08-17 LAB — H PYLORI, IGM, IGG, IGA AB
H Pylori IgG: 0.16 Index Value (ref 0.00–0.79)
H. Pylogi, Iga Abs: <9 units (ref 0.0–8.9)
H. Pylogi, Igm Abs: <9 units (ref 0.0–8.9)

## 2020-08-17 NOTE — Plan of Care (Signed)
GI path panel pending.  Continue sucralfate and PPI.  [facility] will revisit over weekend, once stool studies are back.

## 2020-08-17 NOTE — Discharge Summary (Signed)
Physician Discharge Summary  [name] KGU:[id number] DOB: 1940-11-17 DOA: 08/15/2020  PCP: [name], MD  Admit date: 08/15/2020 Discharge date: 08/17/2020  Admitted From: Home Disposition:  Home  Recommendations for Outpatient Follow-up:  Follow up with PCP in 2-3 weeks  Discharge Condition:Stable CODE STATUS:Full Diet recommendation: Heart healthy   Brief/Interim Summary: 80 y.o. female with medical history significant for Barrett esophagus, CAD, chronic diastolic CHF, remote DVT no longer anticoagulated, history of bowel obstruction status post partial colectomy/colostomy and subsequent takedown, history of CVA, and non-small cell lung cancer diagnosed in June 2022 undergoing chemotherapy and radiation, now presenting to the emergency department with epigastric pain.  The patient reports that she had been doing fairly well, felt that she has been tolerating radiation and chemotherapy well, but then woke up 2 nights ago with epigastric pain that has persisted.  Pain has been constant, stabbing in character, severe at times, and without any alleviating or exacerbating factors identified.  She does not remember experiencing similar pain previously.  She has been more short of breath than usual recently but denies any change in her chronic nonproductive cough.  She has not had any fevers or chills.  She denies vomiting, diarrhea, melena, or hematochezia.  She denies chest pain and had not been experiencing any palpitations until she was in the ED.      Discharge Diagnoses:  Principal Problem:   SVT (supraventricular tachycardia) (HCC) Active Problems:   CAD (coronary artery disease)   Hypokalemia   Hypomagnesemia   Anemia   Acute on chronic diastolic CHF (congestive heart failure) (HCC)   COPD (chronic obstructive pulmonary disease) (HCC)   Malignant neoplasm of bronchus of right lower lobe (HCC)   Occult GI bleeding  1. SVT  - Pt presents with epigastric pain and  was found to be in SVT  - She was treated successfully with adenosine in ED, later went into SVT again, and responded well to 2.5 mg IV Lopressor in the ED, now on scheduled toprol XL - HR remained stable and controlled   2. Occult GIB; anemia  - Patient reports hx of Barrett esophagus followed by GI in [place], presents with epigastric pain, and is found to be FOBT+ with a drop in Hgb to 8.9 from 10.6 on 08/06/20  - No acute findings on CT abd/pelvis; BUN is normal -Held ASA while in hospital -Hgb remained stable this course -Discussed with GI. OK to d/c and OK to resume ASA on d/c per GI   3. Non small cell lung cancer; cavitary lung lesion  - Diagnosed in June 2022 and started on chemotherapy and radiation  - There is a cavitary RLL lesion noted on CTA in ED that appears to correspond to the hypermetabolic lesion on PET scan from 07/05/20   -Appears to be stable at this time   4. CAD  - No anginal complaints  - Resume ASA on d/c per GI   5. Acute on chronic diastolic CHF  - CTA chest concerning for acute CHF  - Hold off on diuresing for now as SBP is low 100s/MAP is 65 in ED and she is speaking full sentences and not hypoxic   6. Hypokalemia; hypomagnesemia  - replaced  Discharge Instructions   Allergies as of 08/17/2020       Reactions   Pregabalin Swelling   Tongue swelling   Sulfonamide Derivatives Swelling   Childhood reaction        Medication List  TAKE these medications    aspirin EC 81 MG tablet Take 2 tablets (162 mg total) by mouth in the morning. Okay to restart this medication on 07/04/2020   atorvastatin 80 MG tablet Commonly known as: LIPITOR Take 80 mg by mouth every evening.   buPROPion 150 MG 24 hr tablet Commonly known as: WELLBUTRIN XL Take 1 tablet (150 mg total) by mouth daily.   CALCIUM PO Take 1 tablet by mouth in the morning.   fish oil-omega-3 fatty acids 1000 MG capsule Take 1,000 mg by mouth in the morning.   fluticasone 50  MCG/ACT nasal spray Commonly known as: FLONASE Place 1 spray into both nostrils daily as needed for allergies.   folic acid 962 MCG tablet Commonly known as: FOLVITE Take 800 mcg by mouth in the morning.   lidocaine-prilocaine cream Commonly known as: EMLA Apply 1 application topically as needed. What changed: reasons to take this   metoprolol succinate 50 MG 24 hr tablet Commonly known as: TOPROL-XL TAKE 1 TABLET BY MOUTH  DAILY   mirtazapine 45 MG tablet Commonly known as: REMERON Take 45 mg by mouth at bedtime.   nitroGLYCERIN 0.4 MG SL tablet Commonly known as: NITROSTAT DISSOLVE 1 TABLET UNDER THE TONGUE EVERY 5 MINUTES AS  NEEDED FOR CHEST PAIN. MAX  OF 3 TABLETS IN 15 MINUTES. CALL 911 IF PAIN PERSISTS. What changed: See the new instructions.   ondansetron 4 MG tablet Commonly known as: ZOFRAN Take 4 mg by mouth every 8 (eight) hours as needed for nausea or vomiting.   pantoprazole 40 MG tablet Commonly known as: PROTONIX Take 40 mg by mouth in the morning.   prochlorperazine 10 MG tablet Commonly known as: COMPAZINE Take 1 tablet (10 mg total) by mouth every 6 (six) hours as needed for nausea or vomiting.   sertraline 100 MG tablet Commonly known as: ZOLOFT Take 200 mg by mouth in the morning.   VITAMIN D3 PO Take 1 tablet by mouth in the morning.        Follow-up Information     [name], MD. Schedule an appointment as soon as possible for a visit in 2 week(s).   Specialty: Internal Medicine Why: Hospital follow up Contact information: [place] [address] [phone]         Dorothy Spark, MD .   Specialty: Cardiology Contact information: [address] [place] [id number] [phone]         Constance Haw, MD .   Specialty: Cardiology Contact information: [place] [address] [phone]                Allergies  Allergen Reactions    Pregabalin Swelling    Tongue swelling   Sulfonamide Derivatives Swelling    Childhood reaction    Consultations: GI  Procedures/Studies: CT Angio Chest PE W/Cm &/Or Wo Cm  Result Date: 08/16/2020 CLINICAL DATA:  80 year old female with shortness of breath and abdominal pain. EXAM: CT ANGIOGRAPHY CHEST CT ABDOMEN AND PELVIS WITH CONTRAST TECHNIQUE: Multidetector CT imaging of the chest was performed using the standard protocol during bolus administration of intravenous contrast. Multiplanar CT image reconstructions and MIPs were obtained to evaluate the vascular anatomy. Multidetector CT imaging of the abdomen and pelvis was performed using the standard protocol during bolus administration of intravenous contrast. CONTRAST:  117mL OMNIPAQUE IOHEXOL 350 MG/ML SOLN COMPARISON:  Chest radiograph dated 07/02/2020 and CT abdomen pelvis  dated 08/29/2014. FINDINGS: Evaluation of this exam is limited due to respiratory motion artifact. CTA CHEST FINDINGS Cardiovascular: There is mild cardiomegaly. No pericardial effusion. There is a 3 vessel coronary vascular calcification. There is mild atherosclerotic calcification of thoracic aorta. No aneurysmal dilatation. Mildly dilated main pulmonary trunk may represent pulmonary hypertension. Clinical correlation is recommended. Evaluation of the pulmonary arteries is limited due to respiratory motion artifact and suboptimal visualization of the peripheral branches. No definite pulmonary artery embolus identified. Mediastinum/Nodes: No hilar or mediastinal adenopathy. The esophagus is grossly unremarkable. No mediastinal fluid collection. Lungs/Pleura: Small bilateral pleural effusions. There is diffuse interstitial and interlobular septal prominence and edema. There is background of centrilobular emphysema. There is a 2 cm cavitary lesion with air-fluid level in the right lower lobe which may represent fungal infection, abscess. Other etiologies including TB or  neoplasm is not excluded. Clinical correlation is recommended. Several adjacent metallic clips noted. This collection therefore may represent post bronchoscopy or biopsy changes. There is no pneumothorax. The central airways are patent. Musculoskeletal: There is osteopenia with degenerative changes of the spine. No acute osseous pathology. Review of the MIP images confirms the above findings. CT ABDOMEN and PELVIS FINDINGS No intra-abdominal free air or free fluid. Hepatobiliary: The liver is unremarkable. Mild intrahepatic biliary ductal dilatation. Cholecystectomy. Pancreas: Unremarkable. No pancreatic ductal dilatation or surrounding inflammatory changes. Spleen: Normal in size without focal abnormality. Adrenals/Urinary Tract: The adrenal glands are unremarkable. There is no hydronephrosis on either side. There is symmetric enhancement and excretion of contrast by both kidneys. The visualized ureters and urinary bladder appear unremarkable. Stomach/Bowel: There is postsurgical changes of bowel with multiple anastomotic sutures. There is no bowel obstruction or active inflammation. Appendectomy. Vascular/Lymphatic: Moderate aortoiliac atherosclerotic disease. The IVC is unremarkable. No portal venous gas. There is no adenopathy. Reproductive: Hysterectomy. No adnexal masses. Other: Midline vertical anterior abdominal wall incisional scar. There is a broad-based ventral hernia containing a segment of the transverse colon without evidence of obstruction. Musculoskeletal: Osteopenia with degenerative changes of the spine and hips. No acute osseous pathology. Review of the MIP images confirms the above findings. IMPRESSION: 1. No CT evidence of pulmonary artery embolus. 2. A 2 cm cavitary lesion with air-fluid level in the right lower lobe may represent fungal infection, abscess, or less likely malignancy. Clinical correlation and follow-up recommended. 3. Cardiomegaly with findings of CHF and small bilateral pleural  effusions. 4. No acute intra-abdominal or pelvic pathology. 5. Aortic Atherosclerosis (ICD10-I70.0) and Emphysema (ICD10-J43.9). Electronically Signed   By: Anner Crete M.D.   On: 08/16/2020 00:41   MR Brain W Wo Contrast  Result Date: 07/19/2020 CLINICAL DATA:  Non-small cell lung cancer, pretreatment staging. EXAM: MRI HEAD WITHOUT AND WITH CONTRAST TECHNIQUE: Multiplanar, multiecho pulse sequences of the brain and surrounding structures were obtained without and with intravenous contrast. CONTRAST:  29mL GADAVIST GADOBUTROL 1 MMOL/ML IV SOLN COMPARISON:  CT head 01/23/2015. MRI 08/09/2014. FINDINGS: Brain: No acute infarction, hemorrhage, hydrocephalus, extra-axial collection or mass lesion. Confluent remote high right frontoparietal infarct with encephalomalacia and surrounding gliosis. Small remote lacunar infarct in the right basal ganglia. Additional smaller remote cortical infarcts in the right frontal lobe. Small nonenhancing focus of susceptibility artifact in the left frontal lobe appears similar to prior and most likely represents the sequela of chronic microhemorrhage. Additional scattered mild T2/FLAIR hyperintensities within the white matter and pons, most likely related to chronic microvascular ischemic disease. No abnormal enhancement to suggest intraparenchymal metastatic disease. Vascular: Major arterial flow voids are  maintained skull base. Small left cerebellar developmental venous anomaly. Skull and upper cervical spine: Normal marrow signal. Sinuses/Orbits: Inferior left maxillary sinus mucosal thickening with possible retention cyst. Mild ethmoid air cell mucosal thickening. Unremarkable orbits. Other: No sizable mastoid effusions. IMPRESSION: 1. No evidence of acute abnormality or metastatic disease. 2. Confluent remote high right frontoparietal infarct, smaller remote cortical infarcts in the right frontal lobe and small lacunar infarct in the right basal ganglia. Electronically  Signed   By: Margaretha Sheffield MD   On: 07/19/2020 08:45   CT Abdomen Pelvis W Contrast  Result Date: 08/16/2020 CLINICAL DATA:  80 year old female with shortness of breath and abdominal pain. EXAM: CT ANGIOGRAPHY CHEST CT ABDOMEN AND PELVIS WITH CONTRAST TECHNIQUE: Multidetector CT imaging of the chest was performed using the standard protocol during bolus administration of intravenous contrast. Multiplanar CT image reconstructions and MIPs were obtained to evaluate the vascular anatomy. Multidetector CT imaging of the abdomen and pelvis was performed using the standard protocol during bolus administration of intravenous contrast. CONTRAST:  142mL OMNIPAQUE IOHEXOL 350 MG/ML SOLN COMPARISON:  Chest radiograph dated 07/02/2020 and CT abdomen pelvis dated 08/29/2014. FINDINGS: Evaluation of this exam is limited due to respiratory motion artifact. CTA CHEST FINDINGS Cardiovascular: There is mild cardiomegaly. No pericardial effusion. There is a 3 vessel coronary vascular calcification. There is mild atherosclerotic calcification of thoracic aorta. No aneurysmal dilatation. Mildly dilated main pulmonary trunk may represent pulmonary hypertension. Clinical correlation is recommended. Evaluation of the pulmonary arteries is limited due to respiratory motion artifact and suboptimal visualization of the peripheral branches. No definite pulmonary artery embolus identified. Mediastinum/Nodes: No hilar or mediastinal adenopathy. The esophagus is grossly unremarkable. No mediastinal fluid collection. Lungs/Pleura: Small bilateral pleural effusions. There is diffuse interstitial and interlobular septal prominence and edema. There is background of centrilobular emphysema. There is a 2 cm cavitary lesion with air-fluid level in the right lower lobe which may represent fungal infection, abscess. Other etiologies including TB or neoplasm is not excluded. Clinical correlation is recommended. Several adjacent metallic clips  noted. This collection therefore may represent post bronchoscopy or biopsy changes. There is no pneumothorax. The central airways are patent. Musculoskeletal: There is osteopenia with degenerative changes of the spine. No acute osseous pathology. Review of the MIP images confirms the above findings. CT ABDOMEN and PELVIS FINDINGS No intra-abdominal free air or free fluid. Hepatobiliary: The liver is unremarkable. Mild intrahepatic biliary ductal dilatation. Cholecystectomy. Pancreas: Unremarkable. No pancreatic ductal dilatation or surrounding inflammatory changes. Spleen: Normal in size without focal abnormality. Adrenals/Urinary Tract: The adrenal glands are unremarkable. There is no hydronephrosis on either side. There is symmetric enhancement and excretion of contrast by both kidneys. The visualized ureters and urinary bladder appear unremarkable. Stomach/Bowel: There is postsurgical changes of bowel with multiple anastomotic sutures. There is no bowel obstruction or active inflammation. Appendectomy. Vascular/Lymphatic: Moderate aortoiliac atherosclerotic disease. The IVC is unremarkable. No portal venous gas. There is no adenopathy. Reproductive: Hysterectomy. No adnexal masses. Other: Midline vertical anterior abdominal wall incisional scar. There is a broad-based ventral hernia containing a segment of the transverse colon without evidence of obstruction. Musculoskeletal: Osteopenia with degenerative changes of the spine and hips. No acute osseous pathology. Review of the MIP images confirms the above findings. IMPRESSION: 1. No CT evidence of pulmonary artery embolus. 2. A 2 cm cavitary lesion with air-fluid level in the right lower lobe may represent fungal infection, abscess, or less likely malignancy. Clinical correlation and follow-up recommended. 3. Cardiomegaly with findings of CHF  and small bilateral pleural effusions. 4. No acute intra-abdominal or pelvic pathology. 5. Aortic Atherosclerosis  (ICD10-I70.0) and Emphysema (ICD10-J43.9). Electronically Signed   By: Anner Crete M.D.   On: 08/16/2020 00:41    Subjective: Very eager to go home  Discharge Exam: Vitals:   08/17/20 1000 08/17/20 1200  BP: (!) 127/48 (!) 124/50  Pulse: 88 87  Resp: (!) 26 (!) 35  Temp:  98.1 F (36.7 C)  SpO2: 96% 95%   Vitals:   08/17/20 0800 08/17/20 0900 08/17/20 1000 08/17/20 1200  BP: 124/71 (!) 118/48 (!) 127/48 (!) 124/50  Pulse: 82 80 88 87  Resp: (!) 23 18 (!) 26 (!) 35  Temp: 99.3 F (37.4 C)   98.1 F (36.7 C)  TempSrc: Oral   Axillary  SpO2: 98% 99% 96% 95%  Weight:      Height:        General: Pt is alert, awake, not in acute distress Cardiovascular: RRR, S1/S2 + Respiratory: CTA bilaterally, no wheezing, no rhonchi Abdominal: Soft, NT, ND, bowel sounds + Extremities: no edema, no cyanosis   The results of significant diagnostics from this hospitalization (including imaging, microbiology, ancillary and laboratory) are listed below for reference.     Microbiology: Recent Results (from the past 240 hour(s))  Resp Panel by RT-PCR (Flu A&B, Covid) Nasopharyngeal Swab     Status: None   Collection Time: 08/16/20  1:25 AM   Specimen: Nasopharyngeal Swab; Nasopharyngeal(NP) swabs in vial transport medium  Result Value Ref Range Status   SARS Coronavirus 2 by RT PCR NEGATIVE NEGATIVE Final    Comment: (NOTE) SARS-CoV-2 target nucleic acids are NOT DETECTED.  The SARS-CoV-2 RNA is generally detectable in upper respiratory specimens during the acute phase of infection. The lowest concentration of SARS-CoV-2 viral copies this assay can detect is 138 copies/mL. A negative result does not preclude SARS-Cov-2 infection and should not be used as the sole basis for treatment or other patient management decisions. A negative result may occur with  improper specimen collection/handling, submission of specimen other than nasopharyngeal swab, presence of viral mutation(s)  within the areas targeted by this assay, and inadequate number of viral copies(<138 copies/mL). A negative result must be combined with clinical observations, patient history, and epidemiological information. The expected result is Negative.  Fact Sheet for Patients:  EntrepreneurPulse.com.au  Fact Sheet for Healthcare Providers:  IncredibleEmployment.be  This test is no t yet approved or cleared by the [place] FDA and  has been authorized for detection and/or diagnosis of SARS-CoV-2 by FDA under an Emergency Use Authorization (EUA). This EUA will remain  in effect (meaning this test can be used) for the duration of the COVID-19 declaration under Section 564(b)(1) of the Act, 21 U.S.C.section 360bbb-3(b)(1), unless the authorization is terminated  or revoked sooner.       Influenza A by PCR NEGATIVE NEGATIVE Final   Influenza B by PCR NEGATIVE NEGATIVE Final    Comment: (NOTE) The Xpert Xpress SARS-CoV-2/FLU/RSV plus assay is intended as an aid in the diagnosis of influenza from Nasopharyngeal swab specimens and should not be used as a sole basis for treatment. Nasal washings and aspirates are unacceptable for Xpert Xpress SARS-CoV-2/FLU/RSV testing.  Fact Sheet for Patients: EntrepreneurPulse.com.au  Fact Sheet for Healthcare Providers: IncredibleEmployment.be  This test is not yet approved or cleared by the [place] FDA and has been authorized for detection and/or diagnosis of SARS-CoV-2 by FDA under an Emergency Use Authorization (EUA). This EUA will remain  in effect (meaning this test can be used) for the duration of the COVID-19 declaration under Section 564(b)(1) of the Act, 21 U.S.C. section 360bbb-3(b)(1), unless the authorization is terminated or revoked.  Performed at [facility], [place] [address], [place], [place] [address]   MRSA Next Gen by PCR, Nasal      Status: None   Collection Time: 08/16/20  5:36 AM   Specimen: Nasal Mucosa; Nasal Swab  Result Value Ref Range Status   MRSA by PCR Next Gen NOT DETECTED NOT DETECTED Final    Comment: (NOTE) The GeneXpert MRSA Assay (FDA approved for NASAL specimens only), is one component of a comprehensive MRSA colonization surveillance program. It is not intended to diagnose MRSA infection nor to guide or monitor treatment for MRSA infections. Test performance is not FDA approved in patients less than 25 years old. Performed at [facility], [place] [address], [place], [place] [address]   C Difficile Quick Screen w PCR reflex     Status: Abnormal   Collection Time: 08/16/20  3:00 PM   Specimen: STOOL  Result Value Ref Range Status   C Diff antigen POSITIVE (A) NEGATIVE Final   C Diff toxin NEGATIVE NEGATIVE Final   C Diff interpretation Results are indeterminate. See PCR results.  Final    Comment: Performed at [facility], [place] [address] [place], [place] [address]  C. Diff by PCR, Reflexed     Status: None   Collection Time: 08/16/20  3:00 PM  Result Value Ref Range Status   Toxigenic C. Difficile by PCR NEGATIVE NEGATIVE Final    Comment: Patient is colonized with non toxigenic C. difficile. May not need treatment unless significant symptoms are present. Performed at [facility], [place] [address] [place], [place] [address]      Labs: BNP (last 3 results) No results for input(s): BNP in the last 8760 hours. Basic Metabolic Panel: Recent Labs  Lab 08/13/20 1007 08/15/20 2035 08/16/20 0417 08/17/20 0418  NA 146* 147* 145 140  K 3.6 3.3* 3.9 3.9  CL 115* 112* 115* 109  CO2 24 23 24 25   GLUCOSE 94 132* 111* 108*  BUN 14 19 16 14   CREATININE 0.76 0.76 0.78 0.64  CALCIUM 8.6* 8.8* 8.0* 8.5*  MG  --  1.5* 2.6* 1.8   Liver Function Tests: Recent Labs  Lab 08/13/20 1007 08/15/20 2035  AST 15 14*  ALT 14 16  ALKPHOS 68 56  BILITOT 0.5  0.5  PROT 6.0* 5.7*  ALBUMIN 3.4* 3.4*   Recent Labs  Lab 08/15/20 2035  LIPASE 24   No results for input(s): AMMONIA in the last 168 hours. CBC: Recent Labs  Lab 08/13/20 1007 08/15/20 2035 08/16/20 0417 08/16/20 1558 08/17/20 0418  WBC 4.0 4.7 5.2 5.9 4.6  NEUTROABS 2.2  --   --   --   --   HGB 9.7* 8.9* 8.9* 9.2* 9.4*  HCT 30.8* 28.5* 28.6* 29.2* 28.9*  MCV 88.0 89.6 91.1 89.0 87.6  PLT 180 172 173 178 130*   Cardiac Enzymes: No results for input(s): CKTOTAL, CKMB, CKMBINDEX, TROPONINI in the last 168 hours. BNP: Invalid input(s): POCBNP CBG: No results for input(s): GLUCAP in the last 168 hours. D-Dimer No results for input(s): DDIMER in the last 72 hours. Hgb A1c No results for input(s): HGBA1C in the last 72 hours. Lipid Profile No results for input(s): CHOL, HDL, LDLCALC, TRIG, CHOLHDL, LDLDIRECT in the last 72  hours. Thyroid function studies No results for input(s): TSH, T4TOTAL, T3FREE, THYROIDAB in the last 72 hours.  Invalid input(s): FREET3 Anemia work up No results for input(s): VITAMINB12, FOLATE, FERRITIN, TIBC, IRON, RETICCTPCT in the last 72 hours. Urinalysis    Component Value Date/Time   COLORURINE YELLOW 08/16/2020 0444   APPEARANCEUR CLEAR 08/16/2020 0444   LABSPEC >1.046 (H) 08/16/2020 0444   PHURINE 5.0 08/16/2020 0444   GLUCOSEU NEGATIVE 08/16/2020 0444   HGBUR NEGATIVE 08/16/2020 0444   BILIRUBINUR NEGATIVE 08/16/2020 0444   KETONESUR NEGATIVE 08/16/2020 0444   PROTEINUR NEGATIVE 08/16/2020 0444   UROBILINOGEN 0.2 08/29/2014 1856   NITRITE NEGATIVE 08/16/2020 0444   LEUKOCYTESUR NEGATIVE 08/16/2020 0444   Sepsis Labs Invalid input(s): PROCALCITONIN,  WBC,  LACTICIDVEN Microbiology Recent Results (from the past 240 hour(s))  Resp Panel by RT-PCR (Flu A&B, Covid) Nasopharyngeal Swab     Status: None   Collection Time: 08/16/20  1:25 AM   Specimen: Nasopharyngeal Swab; Nasopharyngeal(NP) swabs in vial transport medium  Result  Value Ref Range Status   SARS Coronavirus 2 by RT PCR NEGATIVE NEGATIVE Final    Comment: (NOTE) SARS-CoV-2 target nucleic acids are NOT DETECTED.  The SARS-CoV-2 RNA is generally detectable in upper respiratory specimens during the acute phase of infection. The lowest concentration of SARS-CoV-2 viral copies this assay can detect is 138 copies/mL. A negative result does not preclude SARS-Cov-2 infection and should not be used as the sole basis for treatment or other patient management decisions. A negative result may occur with  improper specimen collection/handling, submission of specimen other than nasopharyngeal swab, presence of viral mutation(s) within the areas targeted by this assay, and inadequate number of viral copies(<138 copies/mL). A negative result must be combined with clinical observations, patient history, and epidemiological information. The expected result is Negative.  Fact Sheet for Patients:  EntrepreneurPulse.com.au  Fact Sheet for Healthcare Providers:  IncredibleEmployment.be  This test is no t yet approved or cleared by the [place] FDA and  has been authorized for detection and/or diagnosis of SARS-CoV-2 by FDA under an Emergency Use Authorization (EUA). This EUA will remain  in effect (meaning this test can be used) for the duration of the COVID-19 declaration under Section 564(b)(1) of the Act, 21 U.S.C.section 360bbb-3(b)(1), unless the authorization is terminated  or revoked sooner.       Influenza A by PCR NEGATIVE NEGATIVE Final   Influenza B by PCR NEGATIVE NEGATIVE Final    Comment: (NOTE) The Xpert Xpress SARS-CoV-2/FLU/RSV plus assay is intended as an aid in the diagnosis of influenza from Nasopharyngeal swab specimens and should not be used as a sole basis for treatment. Nasal washings and aspirates are unacceptable for Xpert Xpress SARS-CoV-2/FLU/RSV testing.  Fact Sheet for  Patients: EntrepreneurPulse.com.au  Fact Sheet for Healthcare Providers: IncredibleEmployment.be  This test is not yet approved or cleared by the [place] FDA and has been authorized for detection and/or diagnosis of SARS-CoV-2 by FDA under an Emergency Use Authorization (EUA). This EUA will remain in effect (meaning this test can be used) for the duration of the COVID-19 declaration under Section 564(b)(1) of the Act, 21 U.S.C. section 360bbb-3(b)(1), unless the authorization is terminated or revoked.  Performed at [facility], [place] [address] [place], [place] [address]   MRSA Next Gen by PCR, Nasal     Status: None   Collection Time: 08/16/20  5:36 AM   Specimen: Nasal Mucosa; Nasal Swab  Result Value Ref Range Status  MRSA by PCR Next Gen NOT DETECTED NOT DETECTED Final    Comment: (NOTE) The GeneXpert MRSA Assay (FDA approved for NASAL specimens only), is one component of a comprehensive MRSA colonization surveillance program. It is not intended to diagnose MRSA infection nor to guide or monitor treatment for MRSA infections. Test performance is not FDA approved in patients less than 49 years old. Performed at [facility], [place] [address] [place], [place] [address]   C Difficile Quick Screen w PCR reflex     Status: Abnormal   Collection Time: 08/16/20  3:00 PM   Specimen: STOOL  Result Value Ref Range Status   C Diff antigen POSITIVE (A) NEGATIVE Final   C Diff toxin NEGATIVE NEGATIVE Final   C Diff interpretation Results are indeterminate. See PCR results.  Final    Comment: Performed at [facility], [place] [address] [place], [place] [address]  C. Diff by PCR, Reflexed     Status: None   Collection Time: 08/16/20  3:00 PM  Result Value Ref Range Status   Toxigenic C. Difficile by PCR NEGATIVE NEGATIVE Final    Comment: Patient is colonized with non toxigenic C.  difficile. May not need treatment unless significant symptoms are present. Performed at [facility], [place] [address], [place], [place] [address]    Time spent: 30 min  SIGNED:   Marylu Lund, MD  [facility] 08/17/2020, 2:25 PM  If 7PM-7AM, please contact night-coverage

## 2020-08-17 NOTE — Progress Notes (Signed)
Heart failure packet reviewed with patient. Hear failure discharge teaching was reviewed with patient and her husband. No questions or concerns at time of discharge

## 2020-08-18 LAB — GASTROINTESTINAL PANEL BY PCR, STOOL (REPLACES STOOL CULTURE)

## 2020-08-20 ENCOUNTER — Inpatient Hospital Stay: Payer: Medicare Other

## 2020-08-20 ENCOUNTER — Inpatient Hospital Stay: Payer: Medicare Other | Attending: Internal Medicine

## 2020-08-20 ENCOUNTER — Ambulatory Visit
Admission: RE | Admit: 2020-08-20 | Discharge: 2020-08-20 | Disposition: A | Discharge status: Home / Self Care | Payer: Medicare Other | Source: Ambulatory Visit | Attending: Radiation Oncology | Admitting: Radiation Oncology

## 2020-08-20 ENCOUNTER — Other Ambulatory Visit: Payer: Self-pay

## 2020-08-20 VITALS — BP 128/47 | HR 70 | Temp 97.9°F | Resp 24 | Wt 146.8 lb

## 2020-08-20 DIAGNOSIS — E876 Hypokalemia: Secondary | ICD-10-CM | POA: Insufficient documentation

## 2020-08-20 DIAGNOSIS — C3431 Malignant neoplasm of lower lobe, right bronchus or lung: Secondary | ICD-10-CM | POA: Diagnosis not present

## 2020-08-20 DIAGNOSIS — R1310 Dysphagia, unspecified: Secondary | ICD-10-CM | POA: Insufficient documentation

## 2020-08-20 DIAGNOSIS — R5383 Other fatigue: Secondary | ICD-10-CM | POA: Insufficient documentation

## 2020-08-20 DIAGNOSIS — K219 Gastro-esophageal reflux disease without esophagitis: Secondary | ICD-10-CM | POA: Diagnosis not present

## 2020-08-20 DIAGNOSIS — Z510 Encounter for antineoplastic radiation therapy: Secondary | ICD-10-CM | POA: Diagnosis not present

## 2020-08-20 DIAGNOSIS — Z5111 Encounter for antineoplastic chemotherapy: Secondary | ICD-10-CM | POA: Insufficient documentation

## 2020-08-20 DIAGNOSIS — C3491 Malignant neoplasm of unspecified part of right bronchus or lung: Secondary | ICD-10-CM

## 2020-08-20 LAB — CBC WITH DIFFERENTIAL (CANCER CENTER ONLY)
Abs Immature Granulocytes: 0.03 10*3/uL (ref 0.00–0.07)
Basophils Absolute: 0 10*3/uL (ref 0.0–0.1)
Basophils Relative: 0 %
Eosinophils Absolute: 0 10*3/uL (ref 0.0–0.5)
Eosinophils Relative: 1 %
HCT: 29.3 % — ABNORMAL LOW (ref 36.0–46.0)
Hemoglobin: 9.4 g/dL — ABNORMAL LOW (ref 12.0–15.0)
Immature Granulocytes: 1 %
Lymphocytes Relative: 14 %
Lymphs Abs: 0.6 10*3/uL — ABNORMAL LOW (ref 0.7–4.0)
MCH: 28.3 pg (ref 26.0–34.0)
MCHC: 32.1 g/dL (ref 30.0–36.0)
MCV: 88.3 fL (ref 80.0–100.0)
Monocytes Absolute: 0.4 10*3/uL (ref 0.1–1.0)
Monocytes Relative: 9 %
Neutro Abs: 3.4 10*3/uL (ref 1.7–7.7)
Neutrophils Relative %: 75 %
Platelet Count: 136 10*3/uL — ABNORMAL LOW (ref 150–400)
RBC: 3.32 MIL/uL — ABNORMAL LOW (ref 3.87–5.11)
RDW: 16.6 % — ABNORMAL HIGH (ref 11.5–15.5)
WBC Count: 4.5 10*3/uL (ref 4.0–10.5)
nRBC: 0 % (ref 0.0–0.2)

## 2020-08-20 LAB — CMP (CANCER CENTER ONLY)
ALT: 14 U/L (ref 0–44)
AST: 15 U/L (ref 15–41)
Albumin: 3.5 g/dL (ref 3.5–5.0)
Alkaline Phosphatase: 72 U/L (ref 38–126)
Anion gap: 10 (ref 5–15)
BUN: 25 mg/dL — ABNORMAL HIGH (ref 8–23)
CO2: 23 mmol/L (ref 22–32)
Calcium: 9.3 mg/dL (ref 8.9–10.3)
Chloride: 111 mmol/L (ref 98–111)
Creatinine: 1.24 mg/dL — ABNORMAL HIGH (ref 0.44–1.00)
GFR, Estimated: 44 mL/min — ABNORMAL LOW (ref >60)
Glucose, Bld: 141 mg/dL — ABNORMAL HIGH (ref 70–99)
Potassium: 3.7 mmol/L (ref 3.5–5.1)
Sodium: 144 mmol/L (ref 135–145)
Total Bilirubin: 0.8 mg/dL (ref 0.3–1.2)
Total Protein: 6.5 g/dL (ref 6.5–8.1)

## 2020-08-20 MED ORDER — SODIUM CHLORIDE 0.9 % IV SOLN
Freq: Once | INTRAVENOUS | Status: AC
  Filled 2020-08-20: qty 250

## 2020-08-20 MED ORDER — PALONOSETRON HCL INJECTION 0.25 MG/5ML
0.2500 mg | Freq: Once | INTRAVENOUS | Status: AC
  Administered 2020-08-20: 0.25 mg via INTRAVENOUS

## 2020-08-20 MED ORDER — SODIUM CHLORIDE 0.9 % IV SOLN
125.0000 mg | Freq: Once | INTRAVENOUS | Status: AC
  Administered 2020-08-20: 130 mg via INTRAVENOUS
  Filled 2020-08-20: qty 13

## 2020-08-20 MED ORDER — PALONOSETRON HCL INJECTION 0.25 MG/5ML
INTRAVENOUS | Status: AC
  Filled 2020-08-20: qty 5

## 2020-08-20 MED ORDER — SODIUM CHLORIDE 0.9 % IV SOLN
20.0000 mg | Freq: Once | INTRAVENOUS | Status: AC
  Administered 2020-08-20: 20 mg via INTRAVENOUS
  Filled 2020-08-20: qty 20

## 2020-08-20 MED ORDER — PACLITAXEL PROTEIN-BOUND CHEMO INJECTION 100 MG
40.0000 mg/m2 | Freq: Once | INTRAVENOUS | Status: AC
  Administered 2020-08-20: 75 mg via INTRAVENOUS
  Filled 2020-08-20: qty 15

## 2020-08-20 NOTE — Patient Instructions (Addendum)
[facility]  Discharge Instructions: Thank you for choosing [facility] to provide your oncology and hematology care.   If you have a lab appointment with the [facility], please go directly to the [facility] and check in at the registration area.   Wear comfortable clothing and clothing appropriate for easy access to any Portacath or PICC line.   We strive to give you quality time with your provider. You may need to reschedule your appointment if you arrive late (15 or more minutes).  Arriving late affects you and other patients whose appointments are after yours.  Also, if you miss three or more appointments without notifying the office, you may be dismissed from the clinic at the provider's discretion.      For prescription refill requests, have your pharmacy contact our office and allow 72 hours for refills to be completed.    Today you received the following chemotherapy and/or immunotherapy agents: Paclitaxel-protein bound (Abraxane) and Carboplatin.    To help prevent nausea and vomiting after your treatment, we encourage you to take your nausea medication as directed.  BELOW ARE SYMPTOMS THAT SHOULD BE REPORTED IMMEDIATELY: *FEVER GREATER THAN 100.4 F (38 C) OR HIGHER *CHILLS OR SWEATING *NAUSEA AND VOMITING THAT IS NOT CONTROLLED WITH YOUR NAUSEA MEDICATION *UNUSUAL SHORTNESS OF BREATH *UNUSUAL BRUISING OR BLEEDING *URINARY PROBLEMS (pain or burning when urinating, or frequent urination) *BOWEL PROBLEMS (unusual diarrhea, constipation, pain near the anus) TENDERNESS IN MOUTH AND THROAT WITH OR WITHOUT PRESENCE OF ULCERS (sore throat, sores in mouth, or a toothache) UNUSUAL RASH, SWELLING OR PAIN  UNUSUAL VAGINAL DISCHARGE OR ITCHING   Items with * indicate a potential emergency and should be followed up as soon as possible or go to the Emergency Department if any problems should occur.  Please show the CHEMOTHERAPY ALERT CARD or  IMMUNOTHERAPY ALERT CARD at check-in to the Emergency Department and triage nurse.  Should you have questions after your visit or need to cancel or reschedule your appointment, please contact [facility]  Dept: [phone]  and follow the prompts.  Office hours are 8:00 a.m. to 4:30 p.m. Monday - Friday. Please note that voicemails left after 4:00 p.m. may not be returned until the following business day.  We are closed weekends and major holidays. You have access to a nurse at all times for urgent questions. Please call the main number to the clinic Dept: [phone] and follow the prompts.   For any non-urgent questions, you may also contact your provider using MyChart. We now offer e-Visits for anyone 6 and older to request care online for non-urgent symptoms. For details visit [email or web address].   Also download the MyChart app! Go to the app store, search "MyChart", open the app, select [facility], and log in with your MyChart username and password.  Due to Covid, a mask is required upon entering the hospital/clinic. If you do not have a mask, one will be given to you upon arrival. For doctor visits, patients may have 1 support person aged 35 or older with them. For treatment visits, patients cannot have anyone with them due to current Covid guidelines and our immunocompromised population.   Nanoparticle Albumin-Bound Paclitaxel injection What is this medication? NANOPARTICLE ALBUMIN-BOUND PACLITAXEL (Na no PAHR ti kuhl al BYOO muhn-bound PAK li TAX el) is a chemotherapy drug. It targets fast dividing cells, like cancer cells, and causes these cells to die. This medicine is used to treatadvanced  breast cancer, lung cancer, and pancreatic cancer. This medicine may be used for other purposes; ask your health care provider orpharmacist if you have questions. COMMON BRAND NAME(S): Abraxane What should I tell my care team before I take this medication? They  need to know if you have any of these conditions: kidney disease liver disease low blood counts, like low white cell, platelet, or red cell counts lung or breathing disease, like asthma tingling of the fingers or toes, or other nerve disorder an unusual or allergic reaction to paclitaxel, albumin, other chemotherapy, other medicines, foods, dyes, or preservatives pregnant or trying to get pregnant breast-feeding How should I use this medication? This drug is given as an infusion into a vein. It is administered in a hospitalor clinic by a specially trained health care professional. Talk to your pediatrician regarding the use of this medicine in children.Special care may be needed. Overdosage: If you think you have taken too much of this medicine contact apoison control center or emergency room at once. NOTE: This medicine is only for you. Do not share this medicine with others. What if I miss a dose? It is important not to miss your dose. Call your doctor or health careprofessional if you are unable to keep an appointment. What may interact with this medication? This medicine may interact with the following medications: antiviral medicines for hepatitis, HIV or AIDS certain antibiotics like erythromycin and clarithromycin certain medicines for fungal infections like ketoconazole and itraconazole certain medicines for seizures like carbamazepine, phenobarbital, phenytoin gemfibrozil nefazodone rifampin St. John's wort This list may not describe all possible interactions. Give your health care provider a list of all the medicines, herbs, non-prescription drugs, or dietary supplements you use. Also tell them if you smoke, drink alcohol, or use illegaldrugs. Some items may interact with your medicine. What should I watch for while using this medication? Your condition will be monitored carefully while you are receiving this medicine. You will need important blood work done while you are taking  thismedicine. This medicine can cause serious allergic reactions. If you experience allergic reactions like skin rash, itching or hives, swelling of the face, lips, ortongue, tell your doctor or health care professional right away. In some cases, you may be given additional medicines to help with side effects.Follow all directions for their use. This drug may make you feel generally unwell. This is not uncommon, as chemotherapy can affect healthy cells as well as cancer cells. Report any side effects. Continue your course of treatment even though you feel ill unless yourdoctor tells you to stop. Call your doctor or health care professional for advice if you get a fever, chills or sore throat, or other symptoms of a cold or flu. Do not treat yourself. This drug decreases your body's ability to fight infections. Try toavoid being around people who are sick. This medicine may increase your risk to bruise or bleed. Call your doctor orhealth care professional if you notice any unusual bleeding. Be careful brushing and flossing your teeth or using a toothpick because you may get an infection or bleed more easily. If you have any dental work done,tell your dentist you are receiving this medicine. Avoid taking products that contain aspirin, acetaminophen, ibuprofen, naproxen, or ketoprofen unless instructed by your doctor. These medicines may hide afever. Do not become pregnant while taking this medicine or for 6 months after stopping it. Women should inform their doctor if they wish to become pregnant or think they might be pregnant. Men should  not father a child while taking this medicine or for 3 months after stopping it. There is a potential for serious side effects to an unborn child. Talk to your health care professionalor pharmacist for more information. Do not breast-feed an infant while taking this medicine or for 2 weeks afterstopping it. This medicine may interfere with the ability to get pregnant or to  father a child. You should talk to your doctor or health care professional if you areconcerned about your fertility. What side effects may I notice from receiving this medication? Side effects that you should report to your doctor or health care professionalas soon as possible: allergic reactions like skin rash, itching or hives, swelling of the face, lips, or tongue breathing problems changes in vision fast, irregular heartbeat low blood pressure mouth sores pain, tingling, numbness in the hands or feet signs of decreased platelets or bleeding - bruising, pinpoint red spots on the skin, black, tarry stools, blood in the urine signs of decreased red blood cells - unusually weak or tired, feeling faint or lightheaded, falls signs of infection - fever or chills, cough, sore throat, pain or difficulty passing urine signs and symptoms of liver injury like dark yellow or brown urine; general ill feeling or flu-like symptoms; light-colored stools; loss of appetite; nausea; right upper belly pain; unusually weak or tired; yellowing of the eyes or skin swelling of the ankles, feet, hands unusually slow heartbeat Side effects that usually do not require medical attention (report to yourdoctor or health care professional if they continue or are bothersome): diarrhea hair loss loss of appetite nausea, vomiting tiredness This list may not describe all possible side effects. Call your doctor for medical advice about side effects. You may report side effects to FDA at1-800-FDA-1088. Where should I keep my medication? This drug is given in a hospital or clinic and will not be stored at home. NOTE: This sheet is a summary. It may not cover all possible information. If you have questions about this medicine, talk to your doctor, pharmacist, orhealth care provider.  2022 Elsevier/Gold Standard (2016-09-09 13:03:45)   Omeprazole (over the counter) take 1-2 tablets once a day.

## 2020-08-21 ENCOUNTER — Ambulatory Visit
Admission: RE | Admit: 2020-08-21 | Discharge: 2020-08-21 | Disposition: A | Discharge status: Home / Self Care | Payer: Medicare Other | Source: Ambulatory Visit | Attending: Radiation Oncology | Admitting: Radiation Oncology

## 2020-08-21 DIAGNOSIS — J449 Chronic obstructive pulmonary disease, unspecified: Secondary | ICD-10-CM | POA: Diagnosis not present

## 2020-08-21 DIAGNOSIS — I471 Supraventricular tachycardia: Secondary | ICD-10-CM | POA: Diagnosis not present

## 2020-08-21 DIAGNOSIS — Z79899 Other long term (current) drug therapy: Secondary | ICD-10-CM | POA: Diagnosis not present

## 2020-08-21 DIAGNOSIS — C3491 Malignant neoplasm of unspecified part of right bronchus or lung: Secondary | ICD-10-CM | POA: Diagnosis not present

## 2020-08-21 DIAGNOSIS — D539 Nutritional anemia, unspecified: Secondary | ICD-10-CM | POA: Diagnosis not present

## 2020-08-21 DIAGNOSIS — C3431 Malignant neoplasm of lower lobe, right bronchus or lung: Secondary | ICD-10-CM | POA: Diagnosis not present

## 2020-08-21 DIAGNOSIS — Z510 Encounter for antineoplastic radiation therapy: Secondary | ICD-10-CM | POA: Diagnosis not present

## 2020-08-21 DIAGNOSIS — I10 Essential (primary) hypertension: Secondary | ICD-10-CM | POA: Diagnosis not present

## 2020-08-22 ENCOUNTER — Other Ambulatory Visit: Payer: Self-pay | Admitting: Radiology

## 2020-08-22 ENCOUNTER — Ambulatory Visit
Admission: RE | Admit: 2020-08-22 | Discharge: 2020-08-22 | Disposition: A | Discharge status: Home / Self Care | Payer: Medicare Other | Source: Ambulatory Visit | Attending: Radiation Oncology | Admitting: Radiation Oncology

## 2020-08-22 DIAGNOSIS — C3431 Malignant neoplasm of lower lobe, right bronchus or lung: Secondary | ICD-10-CM | POA: Diagnosis not present

## 2020-08-22 DIAGNOSIS — Z510 Encounter for antineoplastic radiation therapy: Secondary | ICD-10-CM | POA: Diagnosis not present

## 2020-08-23 ENCOUNTER — Encounter (HOSPITAL_COMMUNITY): Payer: Self-pay

## 2020-08-23 ENCOUNTER — Other Ambulatory Visit: Payer: Self-pay

## 2020-08-23 ENCOUNTER — Ambulatory Visit
Admission: RE | Admit: 2020-08-23 | Discharge: 2020-08-23 | Disposition: A | Discharge status: Home / Self Care | Payer: Medicare Other | Source: Ambulatory Visit | Attending: Radiation Oncology | Admitting: Radiation Oncology

## 2020-08-23 ENCOUNTER — Ambulatory Visit (HOSPITAL_COMMUNITY)
Admission: RE | Admit: 2020-08-23 | Discharge: 2020-08-23 | Disposition: A | Discharge status: Home / Self Care | Payer: Medicare Other | Source: Ambulatory Visit | Attending: Physician Assistant | Admitting: Physician Assistant

## 2020-08-23 DIAGNOSIS — I10 Essential (primary) hypertension: Secondary | ICD-10-CM | POA: Diagnosis not present

## 2020-08-23 DIAGNOSIS — Z882 Allergy status to sulfonamides status: Secondary | ICD-10-CM | POA: Diagnosis not present

## 2020-08-23 DIAGNOSIS — Z90710 Acquired absence of both cervix and uterus: Secondary | ICD-10-CM | POA: Insufficient documentation

## 2020-08-23 DIAGNOSIS — Z832 Family history of diseases of the blood and blood-forming organs and certain disorders involving the immune mechanism: Secondary | ICD-10-CM | POA: Diagnosis not present

## 2020-08-23 DIAGNOSIS — I69351 Hemiplegia and hemiparesis following cerebral infarction affecting right dominant side: Secondary | ICD-10-CM | POA: Insufficient documentation

## 2020-08-23 DIAGNOSIS — Z510 Encounter for antineoplastic radiation therapy: Secondary | ICD-10-CM | POA: Diagnosis not present

## 2020-08-23 DIAGNOSIS — Z91041 Radiographic dye allergy status: Secondary | ICD-10-CM | POA: Insufficient documentation

## 2020-08-23 DIAGNOSIS — I2510 Atherosclerotic heart disease of native coronary artery without angina pectoris: Secondary | ICD-10-CM | POA: Diagnosis not present

## 2020-08-23 DIAGNOSIS — Z86718 Personal history of other venous thrombosis and embolism: Secondary | ICD-10-CM | POA: Insufficient documentation

## 2020-08-23 DIAGNOSIS — Z9049 Acquired absence of other specified parts of digestive tract: Secondary | ICD-10-CM | POA: Insufficient documentation

## 2020-08-23 DIAGNOSIS — Z452 Encounter for adjustment and management of vascular access device: Secondary | ICD-10-CM | POA: Diagnosis not present

## 2020-08-23 DIAGNOSIS — Z87891 Personal history of nicotine dependence: Secondary | ICD-10-CM | POA: Diagnosis not present

## 2020-08-23 DIAGNOSIS — C3431 Malignant neoplasm of lower lobe, right bronchus or lung: Secondary | ICD-10-CM | POA: Insufficient documentation

## 2020-08-23 DIAGNOSIS — C3490 Malignant neoplasm of unspecified part of unspecified bronchus or lung: Secondary | ICD-10-CM | POA: Diagnosis not present

## 2020-08-23 DIAGNOSIS — Z7982 Long term (current) use of aspirin: Secondary | ICD-10-CM | POA: Insufficient documentation

## 2020-08-23 DIAGNOSIS — Z79899 Other long term (current) drug therapy: Secondary | ICD-10-CM | POA: Diagnosis not present

## 2020-08-23 DIAGNOSIS — Z8249 Family history of ischemic heart disease and other diseases of the circulatory system: Secondary | ICD-10-CM | POA: Diagnosis not present

## 2020-08-23 HISTORY — PX: IR IMAGING GUIDED PORT INSERTION: IMG5740

## 2020-08-23 MED ORDER — MIDAZOLAM HCL 2 MG/2ML IJ SOLN
INTRAMUSCULAR | Status: AC
  Filled 2020-08-23: qty 2

## 2020-08-23 MED ORDER — FENTANYL CITRATE (PF) 100 MCG/2ML IJ SOLN
INTRAMUSCULAR | Status: AC
  Filled 2020-08-23: qty 2

## 2020-08-23 MED ORDER — HEPARIN SOD (PORK) LOCK FLUSH 100 UNIT/ML IV SOLN
INTRAVENOUS | Status: AC | PRN
  Administered 2020-08-23: 500 [IU] via INTRAVENOUS

## 2020-08-23 MED ORDER — HEPARIN SOD (PORK) LOCK FLUSH 100 UNIT/ML IV SOLN
INTRAVENOUS | Status: AC
  Filled 2020-08-23: qty 5

## 2020-08-23 MED ORDER — LIDOCAINE-EPINEPHRINE 1 %-1:100000 IJ SOLN
INTRAMUSCULAR | Status: AC
  Filled 2020-08-23: qty 1

## 2020-08-23 MED ORDER — SODIUM CHLORIDE 0.9 % IV SOLN: INTRAVENOUS | Status: DC

## 2020-08-23 MED ORDER — FENTANYL CITRATE (PF) 100 MCG/2ML IJ SOLN
INTRAMUSCULAR | Status: AC | PRN
  Administered 2020-08-23 (×2): 50 ug via INTRAVENOUS

## 2020-08-23 MED ORDER — MIDAZOLAM HCL 2 MG/2ML IJ SOLN
INTRAMUSCULAR | Status: AC | PRN
  Administered 2020-08-23 (×2): 1 mg via INTRAVENOUS

## 2020-08-23 MED ORDER — LIDOCAINE-EPINEPHRINE 1 %-1:100000 IJ SOLN
INTRAMUSCULAR | Status: AC | PRN
  Administered 2020-08-23: 20 mL

## 2020-08-23 NOTE — Discharge Instructions (Signed)

## 2020-08-23 NOTE — Procedures (Signed)
Interventional Radiology Procedure Note  Procedure: RT IJ POWER PORT    Complications: None  Estimated Blood Loss:  MIN  Findings: TIP SVCRA    M. TREVOR Tarissa Kerin, MD    

## 2020-08-23 NOTE — H&P (Signed)
Chief Complaint: Patient was seen in consultation today for port placement at the request of [name]  Referring Physician(s): [name]  Supervising Physician: [name]  Patient Status: [place]  History of Present Illness: [name] is a 80 y.o. female with right sided lung [name]. She is to begin both radiation and chemotherapy. She is referred for port placement. PMHx, meds, labs, imaging, allergies reviewed. Feels well, no recent fevers, chills, illness. Has been NPO today as directed.    Past Medical History:  Diagnosis Date   Acute respiratory failure with hypoxia s/p tracheostomy 07/20/2013   Anemia    a. 7-08/2013 felt due to recent critical illness/chronic disease.   Arthritis    handds, knees & back    Barrett's esophagus    CAD (coronary artery disease)    a. Mod dz 2010 initially mgd medically. b. 12/2012 - angina s/p PTCA/DES to mid-circumflex, PTCA/DES to first OM.    Carotid artery disease ([place])    a. 60-70% bilat ICA stenosis by dopplers 05/2012.   Chronic back pain    deteriorating;DDD   Colonic obstruction due to suspected colitis; s/p colectomy/colostomy    a. See SBO.   Complication of anesthesia    hard to wake up from anesthesia    COPD (chronic obstructive pulmonary disease) (HCC)    Depression    takes Remeron and Zoloft daily   Dizziness    was on Bentyl which caused this-took off of it and no problems since   DVT of axillary vein, acute right ([place])    a. Dx 07/2013 felt due to R IJ central line that had been inserted 7/14. Not on anticoag due to GIB issues and small cerebral bleed.   GERD (gastroesophageal reflux disease)    Heart murmur    History of bronchitis    > 47yrs ago    History of colon polyps    History of hiatal hernia    History of kidney stones    was told it was stable but doesn't know for sure that she ever passed it   HTN (hypertension)    Hypertension    takes  Metoprolol daily   Intracranial hemorrhage ([place])    a. 08/11/13 - per DC summary, tiny SAH vs SDH done for altered mentation, anticoag stopped including aspirin.   Joint pain    Neck pain    DDD   Other and unspecified hyperlipidemia    takes Lipitor daily   Pneumonia    hx of->15 yrs ago   Protein-calorie malnutrition, severe w/ electrolyte imbalance    a. Severe hypoalbuminemia leading to 3rd spacing including anasarca and pulm edema 07/2013.   SBO (small bowel obstruction) ([place])    a. Lysis of adhesions and ovarian cystectomy 04/2013 for sBO. b. Admitted 07/2013 for colonic obstruction due to suspected colitis, s/p partial colectomy/colostomy 08/01/13. Admission complicated by anasarca, acute resp failure requiring tracheostomy, decannulated 08/25/13. c. Recurrent SBO8/2015, NGT placed for decompression.    Stroke [facility]) early 80's   right sided weakness--TIA    Past Surgical History:  Procedure Laterality Date   ABDOMINAL HYSTERECTOMY     APPENDECTOMY     BOWEL RESECTION N/A 08/01/2013   Procedure: SMALL BOWEL RESECTION;  Surgeon: Harl Bowie, MD;  Location: [place];  Service: General;  Laterality: N/A;   BRONCHIAL BIOPSY  07/02/2020   Procedure: BRONCHIAL BIOPSIES;  Surgeon: Collene Gobble, MD;  Location: [facility] ENDOSCOPY;  Service: Pulmonary;;   BRONCHIAL  BRUSHINGS  07/02/2020   Procedure: BRONCHIAL BRUSHINGS;  Surgeon: Collene Gobble, MD;  Location: [facility] ENDOSCOPY;  Service: Pulmonary;;   BRONCHIAL NEEDLE ASPIRATION BIOPSY  07/02/2020   Procedure: BRONCHIAL NEEDLE ASPIRATION BIOPSIES;  Surgeon: Collene Gobble, MD;  Location: [facility] ENDOSCOPY;  Service: Pulmonary;;   BRONCHIAL WASHINGS  07/02/2020   Procedure: BRONCHIAL WASHINGS;  Surgeon: Collene Gobble, MD;  Location: [facility] ENDOSCOPY;  Service: Pulmonary;;   cataract surgery Bilateral    CHOLECYSTECTOMY     COLON RESECTION     COLON SURGERY     COLOSTOMY N/A 08/01/2013   Procedure: COLOSTOMY;  Surgeon: Harl Bowie, MD;  Location: [place];  Service: General;  Laterality: N/A;   COLOSTOMY TAKEDOWN  01/24/2014   dr blackman   COLOSTOMY TAKEDOWN N/A 01/24/2014   Procedure: COLOSTOMY TAKEDOWN;  Surgeon: Coralie Keens, MD;  Location: [place];  Service: General;  Laterality: N/A;   CORONARY ANGIOPLASTY WITH STENT PLACEMENT  12/20/2012   STENT TO OM         DR COOPER   ENDARTERECTOMY Right 08/08/2014   Procedure: RIGHT CAROTID ENDARTERECTOMY WITH HEMASHIELD PATCH ANGIOPLASTY;  Surgeon: Elam Dutch, MD;  Location: [place];  Service: Vascular;  Laterality: Right;   FIDUCIAL MARKER PLACEMENT  07/02/2020   Procedure: FIDUCIAL MARKER PLACEMENT;  Surgeon: Collene Gobble, MD;  Location: [place] ENDOSCOPY;  Service: Pulmonary;;   FOOT SURGERY Left    HAND SURGERY Bilateral    for ganglion cysts   HEMOSTASIS CONTROL  07/02/2020   Procedure: HEMOSTASIS CONTROL;  Surgeon: Collene Gobble, MD;  Location: [place] ENDOSCOPY;  Service: Pulmonary;;  ice saline and epi   LAPAROTOMY N/A 08/01/2013   Procedure: EXPLORATORY LAPAROTOMY;  Surgeon: Harl Bowie, MD;  Location: [place];  Service: General;  Laterality: N/A;   LEFT HEART CATH AND CORONARY ANGIOGRAPHY N/A 01/10/2019   Procedure: LEFT HEART CATH AND CORONARY ANGIOGRAPHY;  Surgeon: Troy Sine, MD;  Location: [place] CV LAB;  Service: Cardiovascular;  Laterality: N/A;   LEFT HEART CATHETERIZATION WITH CORONARY ANGIOGRAM N/A 12/20/2012   Procedure: LEFT HEART CATHETERIZATION WITH CORONARY ANGIOGRAM;  Surgeon: Blane Ohara, MD;  Location[facility] C[facility] LAB;  Service: Cardiovascular;  Laterality: N/A;   PARTIAL COLECTOMY N/A 08/01/2013   Procedure: PARTIAL COLECTOMY;  Surgeon: Harl Bowie, MD;  Location: [place];  Service: General;  Laterality: N/A;   ROTATOR CUFF REPAIR Left    TONSILLECTOMY     TRACHEOSTOMY     feinstein   VIDEO BRONCHOSCOPY WITH ENDOBRONCHIAL NAVIGATION N/A 07/02/2020   Procedure: VIDEO BRONCHOSCOPY WITH ENDOBRONCHIAL NAVIGATION;  Surgeon: Collene Gobble, MD;   Location: [place] ENDOSCOPY;  Service: Pulmonary;  Laterality: N/A;    Allergies: Pregabalin and Sulfonamide derivatives  Medications: Prior to Admission medications   Medication Sig Start Date End Date Taking? Authorizing Provider  aspirin EC 81 MG tablet Take 2 tablets (162 mg total) by mouth in the morning. Okay to restart this medication on 07/04/2020 07/02/20  Yes Collene Gobble, MD  atorvastatin (LIPITOR) 80 MG tablet Take 80 mg by mouth every evening.    Yes [provider]  buPROPion (WELLBUTRIN XL) 150 MG 24 hr tablet Take 1 tablet (150 mg total) by mouth daily. 03/02/15  Yes Dorothy Spark, MD  CALCIUM PO Take 1 tablet by mouth in the morning.   Yes [provider]  Cholecalciferol (VITAMIN D3 PO) Take 1 tablet by mouth in the morning.   Yes [provider]  fish oil-omega-3 fatty acids 1000 MG capsule Take 1,000 mg by mouth in the morning.   Yes [provider]  fluticasone (FLONASE) 50 MCG/ACT nasal spray Place 1 spray into both nostrils daily as needed for allergies. 11/18/18  Yes [provider]  folic acid (FOLVITE) 144 MCG tablet Take 800 mcg by mouth in the morning.   Yes [provider]  metoprolol succinate (TOPROL-XL) 50 MG 24 hr tablet TAKE 1 TABLET BY MOUTH  DAILY 12/21/19  Yes Camnitz, Will Hassell Done, MD  mirtazapine (REMERON) 45 MG tablet Take 45 mg by mouth at bedtime.  06/29/18  Yes [provider]  pantoprazole (PROTONIX) 40 MG tablet Take 40 mg by mouth in the morning.   Yes [provider]  sertraline (ZOLOFT) 100 MG tablet Take 200 mg by mouth in the morning. 06/27/14  Yes [provider]  lidocaine-prilocaine (EMLA) cream Apply 1 application topically as needed. 08/13/20   Heilingoetter, Cassandra L, PA-C  nitroGLYCERIN (NITROSTAT) 0.4 MG SL tablet DISSOLVE 1 TABLET UNDER THE TONGUE EVERY 5 MINUTES AS  NEEDED FOR CHEST PAIN. MAX  OF 3 TABLETS IN 15 MINUTES. CALL 911 IF PAIN PERSISTS. 06/04/20    Nahser, Wonda Cheng, MD  ondansetron (ZOFRAN) 4 MG tablet Take 4 mg by mouth every 8 (eight) hours as needed for nausea or vomiting.     [provider]  prochlorperazine (COMPAZINE) 10 MG tablet Take 1 tablet (10 mg total) by mouth every 6 (six) hours as needed for nausea or vomiting. 07/27/20   Curt Bears, MD     Family History  Problem Relation Age of Onset   Varicose Veins Mother    Heart disease Father    Heart attack Father    AAA (abdominal aortic aneurysm) Father    Heart disease Other     Social History   Socioeconomic History   Marital status: Married    Spouse name: Not on file   Number of children: Not on file   Years of education: Not on file   Highest education level: Not on file  Occupational History   Occupation: full time  Tobacco Use   Smoking status: Former    Packs/day: 0.25    Years: 53.00    Pack years: 13.25    Types: Cigarettes   Smokeless tobacco: Never   Tobacco comments:    Stopped smoking in May 2022. ARJ 08/10/20  Vaping Use   Vaping Use: Never used  Substance and Sexual Activity   Alcohol use: No    Alcohol/week: 0.0 standard drinks   Drug use: No   Sexual activity: Not Currently    Birth control/protection: Surgical    Comment: Hysterectomy  Other Topics Concern   Not on file  Social History Narrative   Not on file   Social Determinants of Health   Financial Resource Strain: Not on file  Food Insecurity: Not on file  Transportation Needs: Not on file  Physical Activity: Not on file  Stress: Not on file  Social Connections: Not on file    Review of Systems: A 12 point ROS discussed and pertinent positives are indicated in the HPI above.  All other systems are negative.  Review of Systems  Vital Signs: BP (!) 121/97   Pulse 76   Temp 98.3 F (36.8 C) (Oral)   Resp 20   SpO2 97%   Physical Exam Constitutional:      Appearance: Normal appearance. She is not ill-appearing.  HENT:  Mouth/Throat:     Mouth:  Mucous membranes are moist.     Pharynx: Oropharynx is clear.  Cardiovascular:     Rate and Rhythm: Normal rate and regular rhythm.     Heart sounds: Normal heart sounds.  Pulmonary:     Effort: Pulmonary effort is normal. No respiratory distress.     Breath sounds: Normal breath sounds.  Skin:    General: Skin is warm and dry.  Neurological:     General: No focal deficit present.     Mental Status: She is alert and oriented to person, place, and time.  Psychiatric:        Mood and Affect: Mood normal.        Thought Content: Thought content normal.        Judgment: Judgment normal.    Imaging: CT Angio Chest PE W/Cm &/Or Wo Cm  Result Date: 08/16/2020 CLINICAL DATA:  80 year old female with shortness of breath and abdominal pain. EXAM: CT ANGIOGRAPHY CHEST CT ABDOMEN AND PELVIS WITH CONTRAST TECHNIQUE: Multidetector CT imaging of the chest was performed using the standard protocol during bolus administration of intravenous contrast. Multiplanar CT image reconstructions and MIPs were obtained to evaluate the vascular anatomy. Multidetector CT imaging of the abdomen and pelvis was performed using the standard protocol during bolus administration of intravenous contrast. CONTRAST:  136mL OMNIPAQUE IOHEXOL 350 MG/ML SOLN COMPARISON:  Chest radiograph dated 07/02/2020 and CT abdomen pelvis dated 08/29/2014. FINDINGS: Evaluation of this exam is limited due to respiratory motion artifact. CTA CHEST FINDINGS Cardiovascular: There is mild cardiomegaly. No pericardial effusion. There is a 3 vessel coronary vascular calcification. There is mild atherosclerotic calcification of thoracic aorta. No aneurysmal dilatation. Mildly dilated main pulmonary trunk may represent pulmonary hypertension. Clinical correlation is recommended. Evaluation of the pulmonary arteries is limited due to respiratory motion artifact and suboptimal visualization of the peripheral branches. No definite pulmonary artery embolus  identified. Mediastinum/Nodes: No hilar or mediastinal adenopathy. The esophagus is grossly unremarkable. No mediastinal fluid collection. Lungs/Pleura: Small bilateral pleural effusions. There is diffuse interstitial and interlobular septal prominence and edema. There is background of centrilobular emphysema. There is a 2 cm cavitary lesion with air-fluid level in the right lower lobe which may represent fungal infection, abscess. Other etiologies including TB or neoplasm is not excluded. Clinical correlation is recommended. Several adjacent metallic clips noted. This collection therefore may represent post bronchoscopy or biopsy changes. There is no pneumothorax. The central airways are patent. Musculoskeletal: There is osteopenia with degenerative changes of the spine. No acute osseous pathology. Review of the MIP images confirms the above findings. CT ABDOMEN and PELVIS FINDINGS No intra-abdominal free air or free fluid. Hepatobiliary: The liver is unremarkable. Mild intrahepatic biliary ductal dilatation. Cholecystectomy. Pancreas: Unremarkable. No pancreatic ductal dilatation or surrounding inflammatory changes. Spleen: Normal in size without focal abnormality. Adrenals/Urinary Tract: The adrenal glands are unremarkable. There is no hydronephrosis on either side. There is symmetric enhancement and excretion of contrast by both kidneys. The visualized ureters and urinary bladder appear unremarkable. Stomach/Bowel: There is postsurgical changes of bowel with multiple anastomotic sutures. There is no bowel obstruction or active inflammation. Appendectomy. Vascular/Lymphatic: Moderate aortoiliac atherosclerotic disease. The IVC is unremarkable. No portal venous gas. There is no adenopathy. Reproductive: Hysterectomy. No adnexal masses. Other: Midline vertical anterior abdominal wall incisional scar. There is a broad-based ventral hernia containing a segment of the transverse colon without evidence of obstruction.  Musculoskeletal: Osteopenia with degenerative changes of the spine and hips. No acute  osseous pathology. Review of the MIP images confirms the above findings. IMPRESSION: 1. No CT evidence of pulmonary artery embolus. 2. A 2 cm cavitary lesion with air-fluid level in the right lower lobe may represent fungal infection, abscess, or less likely malignancy. Clinical correlation and follow-up recommended. 3. Cardiomegaly with findings of CHF and small bilateral pleural effusions. 4. No acute intra-abdominal or pelvic pathology. 5. Aortic Atherosclerosis (ICD10-I70.0) and Emphysema (ICD10-J43.9). Electronically Signed   By: Anner Crete M.D.   On: 08/16/2020 00:41   CT Abdomen Pelvis W Contrast  Result Date: 08/16/2020 CLINICAL DATA:  80 year old female with shortness of breath and abdominal pain. EXAM: CT ANGIOGRAPHY CHEST CT ABDOMEN AND PELVIS WITH CONTRAST TECHNIQUE: Multidetector CT imaging of the chest was performed using the standard protocol during bolus administration of intravenous contrast. Multiplanar CT image reconstructions and MIPs were obtained to evaluate the vascular anatomy. Multidetector CT imaging of the abdomen and pelvis was performed using the standard protocol during bolus administration of intravenous contrast. CONTRAST:  164mL OMNIPAQUE IOHEXOL 350 MG/ML SOLN COMPARISON:  Chest radiograph dated 07/02/2020 and CT abdomen pelvis dated 08/29/2014. FINDINGS: Evaluation of this exam is limited due to respiratory motion artifact. CTA CHEST FINDINGS Cardiovascular: There is mild cardiomegaly. No pericardial effusion. There is a 3 vessel coronary vascular calcification. There is mild atherosclerotic calcification of thoracic aorta. No aneurysmal dilatation. Mildly dilated main pulmonary trunk may represent pulmonary hypertension. Clinical correlation is recommended. Evaluation of the pulmonary arteries is limited due to respiratory motion artifact and suboptimal visualization of the peripheral  branches. No definite pulmonary artery embolus identified. Mediastinum/Nodes: No hilar or mediastinal adenopathy. The esophagus is grossly unremarkable. No mediastinal fluid collection. Lungs/Pleura: Small bilateral pleural effusions. There is diffuse interstitial and interlobular septal prominence and edema. There is background of centrilobular emphysema. There is a 2 cm cavitary lesion with air-fluid level in the right lower lobe which may represent fungal infection, abscess. Other etiologies including TB or neoplasm is not excluded. Clinical correlation is recommended. Several adjacent metallic clips noted. This collection therefore may represent post bronchoscopy or biopsy changes. There is no pneumothorax. The central airways are patent. Musculoskeletal: There is osteopenia with degenerative changes of the spine. No acute osseous pathology. Review of the MIP images confirms the above findings. CT ABDOMEN and PELVIS FINDINGS No intra-abdominal free air or free fluid. Hepatobiliary: The liver is unremarkable. Mild intrahepatic biliary ductal dilatation. Cholecystectomy. Pancreas: Unremarkable. No pancreatic ductal dilatation or surrounding inflammatory changes. Spleen: Normal in size without focal abnormality. Adrenals/Urinary Tract: The adrenal glands are unremarkable. There is no hydronephrosis on either side. There is symmetric enhancement and excretion of contrast by both kidneys. The visualized ureters and urinary bladder appear unremarkable. Stomach/Bowel: There is postsurgical changes of bowel with multiple anastomotic sutures. There is no bowel obstruction or active inflammation. Appendectomy. Vascular/Lymphatic: Moderate aortoiliac atherosclerotic disease. The IVC is unremarkable. No portal venous gas. There is no adenopathy. Reproductive: Hysterectomy. No adnexal masses. Other: Midline vertical anterior abdominal wall incisional scar. There is a broad-based ventral hernia containing a segment of the  transverse colon without evidence of obstruction. Musculoskeletal: Osteopenia with degenerative changes of the spine and hips. No acute osseous pathology. Review of the MIP images confirms the above findings. IMPRESSION: 1. No CT evidence of pulmonary artery embolus. 2. A 2 cm cavitary lesion with air-fluid level in the right lower lobe may represent fungal infection, abscess, or less likely malignancy. Clinical correlation and follow-up recommended. 3. Cardiomegaly with findings of CHF and small bilateral  pleural effusions. 4. No acute intra-abdominal or pelvic pathology. 5. Aortic Atherosclerosis (ICD10-I70.0) and Emphysema (ICD10-J43.9). Electronically Signed   By: Anner Crete M.D.   On: 08/16/2020 00:41    Labs:  CBC: Recent Labs    08/16/20 0417 08/16/20 1558 08/17/20 0418 08/20/20 1000  WBC 5.2 5.9 4.6 4.5  HGB 8.9* 9.2* 9.4* 9.4*  HCT 28.6* 29.2* 28.9* 29.3*  PLT 173 178 130* 136*    COAGS: No results for input(s): INR, APTT in the last 8760 hours.  BMP: Recent Labs    08/15/20 2035 08/16/20 0417 08/17/20 0418 08/20/20 1000  NA 147* 145 140 144  K 3.3* 3.9 3.9 3.7  CL 112* 115* 109 111  CO2 23 24 25 23   GLUCOSE 132* 111* 108* 141*  BUN 19 16 14  25*  CALCIUM 8.8* 8.0* 8.5* 9.3  CREATININE 0.76 0.78 0.64 1.24*  GFRNONAA >60 >60 >60 44*    LIVER FUNCTION TESTS: Recent Labs    08/06/20 1217 08/13/20 1007 08/15/20 2035 08/20/20 1000  BILITOT 0.5 0.5 0.5 0.8  AST 17 15 14* 15  ALT 18 14 16 14   ALKPHOS 76 68 56 72  PROT 6.5 6.0* 5.7* 6.5  ALBUMIN 3.4* 3.4* 3.4* 3.5    TUMOR MARKERS: No results for input(s): AFPTM, CEA, CA199, CHROMGRNA in the last 8760 hours.  Assessment and Plan: Lung [name] For port placement Risks and benefits of image guided port-a-catheter placement was discussed with the patient including, but not limited to bleeding, infection, pneumothorax, or fibrin sheath development and need for additional procedures.  All of the patient's  questions were answered, patient is agreeable to proceed. Consent signed and in chart.   Thank you for this interesting consult.  I greatly enjoyed meeting [name] and look forward to participating in their care.  A copy of this report was sent to the requesting provider on this date.  Electronically Signed: Ascencion Dike, PA-C 08/23/2020, 1:17 PM   I spent a total of 20 minutes in face to face in clinical consultation, greater than 50% of which was counseling/coordinating care for port

## 2020-08-24 ENCOUNTER — Other Ambulatory Visit: Payer: Self-pay | Admitting: Radiation Oncology

## 2020-08-24 ENCOUNTER — Ambulatory Visit
Admission: RE | Admit: 2020-08-24 | Discharge: 2020-08-24 | Disposition: A | Discharge status: Home / Self Care | Payer: Medicare Other | Source: Ambulatory Visit | Attending: Radiation Oncology | Admitting: Radiation Oncology

## 2020-08-24 DIAGNOSIS — C3431 Malignant neoplasm of lower lobe, right bronchus or lung: Secondary | ICD-10-CM | POA: Diagnosis not present

## 2020-08-24 DIAGNOSIS — Z510 Encounter for antineoplastic radiation therapy: Secondary | ICD-10-CM | POA: Diagnosis not present

## 2020-08-24 MED ORDER — SUCRALFATE 1 G PO TABS: 1.0000 g | ORAL_TABLET | Freq: Four times a day (QID) | ORAL | 2 refills | Status: DC

## 2020-08-27 ENCOUNTER — Inpatient Hospital Stay: Payer: Medicare Other

## 2020-08-27 ENCOUNTER — Encounter: Payer: Self-pay | Admitting: Internal Medicine

## 2020-08-27 ENCOUNTER — Inpatient Hospital Stay (HOSPITAL_BASED_OUTPATIENT_CLINIC_OR_DEPARTMENT_OTHER): Payer: Medicare Other | Admitting: Internal Medicine

## 2020-08-27 ENCOUNTER — Other Ambulatory Visit: Payer: Medicare Other

## 2020-08-27 ENCOUNTER — Ambulatory Visit
Admission: RE | Admit: 2020-08-27 | Discharge: 2020-08-27 | Disposition: A | Discharge status: Home / Self Care | Payer: Medicare Other | Source: Ambulatory Visit | Attending: Radiation Oncology | Admitting: Radiation Oncology

## 2020-08-27 ENCOUNTER — Other Ambulatory Visit: Payer: Self-pay

## 2020-08-27 VITALS — BP 136/50 | HR 71 | Temp 96.8°F | Resp 19 | Ht 59.0 in | Wt 147.3 lb

## 2020-08-27 DIAGNOSIS — E876 Hypokalemia: Secondary | ICD-10-CM | POA: Diagnosis not present

## 2020-08-27 DIAGNOSIS — K219 Gastro-esophageal reflux disease without esophagitis: Secondary | ICD-10-CM | POA: Diagnosis not present

## 2020-08-27 DIAGNOSIS — R5383 Other fatigue: Secondary | ICD-10-CM | POA: Diagnosis not present

## 2020-08-27 DIAGNOSIS — C3431 Malignant neoplasm of lower lobe, right bronchus or lung: Secondary | ICD-10-CM | POA: Diagnosis not present

## 2020-08-27 DIAGNOSIS — C3491 Malignant neoplasm of unspecified part of right bronchus or lung: Secondary | ICD-10-CM

## 2020-08-27 DIAGNOSIS — Z95828 Presence of other vascular implants and grafts: Secondary | ICD-10-CM

## 2020-08-27 DIAGNOSIS — Z5111 Encounter for antineoplastic chemotherapy: Secondary | ICD-10-CM | POA: Diagnosis not present

## 2020-08-27 DIAGNOSIS — Z510 Encounter for antineoplastic radiation therapy: Secondary | ICD-10-CM | POA: Diagnosis not present

## 2020-08-27 DIAGNOSIS — R1310 Dysphagia, unspecified: Secondary | ICD-10-CM | POA: Diagnosis not present

## 2020-08-27 LAB — CBC WITH DIFFERENTIAL (CANCER CENTER ONLY)
Abs Immature Granulocytes: 0.02 10*3/uL (ref 0.00–0.07)
Basophils Absolute: 0 10*3/uL (ref 0.0–0.1)
Basophils Relative: 0 %
Eosinophils Absolute: 0 10*3/uL (ref 0.0–0.5)
Eosinophils Relative: 1 %
HCT: 26.6 % — ABNORMAL LOW (ref 36.0–46.0)
Hemoglobin: 8.7 g/dL — ABNORMAL LOW (ref 12.0–15.0)
Immature Granulocytes: 1 %
Lymphocytes Relative: 16 %
Lymphs Abs: 0.6 10*3/uL — ABNORMAL LOW (ref 0.7–4.0)
MCH: 28.5 pg (ref 26.0–34.0)
MCHC: 32.7 g/dL (ref 30.0–36.0)
MCV: 87.2 fL (ref 80.0–100.0)
Monocytes Absolute: 0.1 10*3/uL (ref 0.1–1.0)
Monocytes Relative: 3 %
Neutro Abs: 3 10*3/uL (ref 1.7–7.7)
Neutrophils Relative %: 79 %
Platelet Count: 83 10*3/uL — ABNORMAL LOW (ref 150–400)
RBC: 3.05 MIL/uL — ABNORMAL LOW (ref 3.87–5.11)
RDW: 17 % — ABNORMAL HIGH (ref 11.5–15.5)
WBC Count: 3.8 10*3/uL — ABNORMAL LOW (ref 4.0–10.5)
nRBC: 0 % (ref 0.0–0.2)

## 2020-08-27 LAB — CMP (CANCER CENTER ONLY)
ALT: 10 U/L (ref 0–44)
AST: 11 U/L — ABNORMAL LOW (ref 15–41)
Albumin: 3.4 g/dL — ABNORMAL LOW (ref 3.5–5.0)
Alkaline Phosphatase: 68 U/L (ref 38–126)
Anion gap: 9 (ref 5–15)
BUN: 21 mg/dL (ref 8–23)
CO2: 23 mmol/L (ref 22–32)
Calcium: 9.1 mg/dL (ref 8.9–10.3)
Chloride: 113 mmol/L — ABNORMAL HIGH (ref 98–111)
Creatinine: 0.87 mg/dL (ref 0.44–1.00)
GFR, Estimated: >60 mL/min (ref >60)
Glucose, Bld: 131 mg/dL — ABNORMAL HIGH (ref 70–99)
Potassium: 3.7 mmol/L (ref 3.5–5.1)
Sodium: 145 mmol/L (ref 135–145)
Total Bilirubin: 0.4 mg/dL (ref 0.3–1.2)
Total Protein: 5.9 g/dL — ABNORMAL LOW (ref 6.5–8.1)

## 2020-08-27 MED ORDER — HEPARIN SOD (PORK) LOCK FLUSH 100 UNIT/ML IV SOLN
500.0000 [IU] | Freq: Once | INTRAVENOUS | Status: AC
  Administered 2020-08-27: 500 [IU] via INTRAVENOUS
  Filled 2020-08-27: qty 5

## 2020-08-27 MED ORDER — SODIUM CHLORIDE 0.9% FLUSH
10.0000 mL | INTRAVENOUS | Status: AC | PRN
  Administered 2020-08-27: 10 mL
  Filled 2020-08-27: qty 10

## 2020-08-27 MED ORDER — SODIUM CHLORIDE 0.9% FLUSH
10.0000 mL | INTRAVENOUS | Status: DC | PRN
  Administered 2020-08-27: 10 mL via INTRAVENOUS
  Filled 2020-08-27: qty 10

## 2020-08-27 NOTE — Progress Notes (Signed)
[place] [phone]   [phone]  OFFICE PROGRESS NOTE  [name], MD [place] [address]  DIAGNOSIS: Stage IIIB (T1b, N3, M0) non-small cell lung cancer, squamous cell carcinoma presented with right lower lobe lung nodule in addition to bilateral mediastinal and right hilar lymphadenopathy diagnosed in June 2022.  PDL TPS 30%.  PRIOR THERAPY: None.  CURRENT THERAPY: A course of concurrent chemoradiation with weekly carboplatin for AUC of 2 and paclitaxel 45 Mg/M2.  First cycle July 30, 2020.  Status post 4 cycles of treatment  INTERVAL HISTORY: [name] 80 y.o. female returns to the clinic today for follow-up visit accompanied by her husband.  The patient is complaining of odynophagia and she is taking Carafate but not helping.  She discussed her condition with radiation oncology earlier today.  She is also on PPI for acid reflux.  She was in the hospital recently with congestive heart failure.  She denied having any significant chest pain but has shortness of breath at baseline increased with exertion with no cough or hemoptysis.  She denied having any fever or chills.  She has no nausea, vomiting, diarrhea or constipation.  She was supposed to start cycle #5 of her treatment today.  MEDICAL HISTORY: Past Medical History:  Diagnosis Date   Acute respiratory failure with hypoxia s/p tracheostomy 07/20/2013   Anemia    a. 7-08/2013 felt due to recent critical illness/chronic disease.   Arthritis    handds, knees & back    Barrett's esophagus    CAD (coronary artery disease)    a. Mod dz 2010 initially mgd medically. b. 12/2012 - angina s/p PTCA/DES to mid-circumflex, PTCA/DES to first OM.    Carotid artery disease ([place])    a. 60-70% bilat ICA stenosis by dopplers 05/2012.   Chronic back pain    deteriorating;DDD   Colonic obstruction due to suspected colitis; s/p colectomy/colostomy     a. See SBO.   Complication of anesthesia    hard to wake up from anesthesia    COPD (chronic obstructive pulmonary disease) (HCC)    Depression    takes Remeron and Zoloft daily   Dizziness    was on Bentyl which caused this-took off of it and no problems since   DVT of axillary vein, acute right ([place])    a. Dx 07/2013 felt due to R IJ central line that had been inserted 7/14. Not on anticoag due to GIB issues and small cerebral bleed.   GERD (gastroesophageal reflux disease)    Heart murmur    History of bronchitis    > 44yr ago    History of colon polyps    History of hiatal hernia    History of kidney stones    was told it was stable but doesn't know for sure that she ever passed it   HTN (hypertension)    Hypertension    takes Metoprolol daily   Intracranial hemorrhage ([facility]    a. 08/11/13 - per DC summary, tiny SAH vs SDH done for altered mentation, anticoag stopped including aspirin.   Joint pain    Neck pain    DDD   Other and unspecified hyperlipidemia    takes Lipitor daily   Pneumonia    hx of->15 yrs ago   Protein-calorie malnutrition, severe w/ electrolyte imbalance    a. Severe hypoalbuminemia leading to 3rd spacing including anasarca and pulm edema 07/2013.  SBO (small bowel obstruction) ([place])    a. Lysis of adhesions and ovarian cystectomy 04/2013 for sBO. b. Admitted 07/2013 for colonic obstruction due to suspected colitis, s/p partial colectomy/colostomy 08/01/13. Admission complicated by anasarca, acute resp failure requiring tracheostomy, decannulated 08/25/13. c. Recurrent SBO8/2015, NGT placed for decompression.    Stroke [facility]) early 80's   right sided weakness--TIA    ALLERGIES:  is allergic to pregabalin and sulfonamide derivatives.  MEDICATIONS:  Current Outpatient Medications  Medication Sig Dispense Refill   aspirin EC 81 MG tablet Take 2 tablets (162 mg total) by mouth in the morning. Okay to restart this medication on 07/04/2020 30 tablet 11    atorvastatin (LIPITOR) 80 MG tablet Take 80 mg by mouth every evening.      buPROPion (WELLBUTRIN XL) 150 MG 24 hr tablet Take 1 tablet (150 mg total) by mouth daily. 90 tablet 3   CALCIUM PO Take 1 tablet by mouth in the morning.     Cholecalciferol (VITAMIN D3 PO) Take 1 tablet by mouth in the morning.     fish oil-omega-3 fatty acids 1000 MG capsule Take 1,000 mg by mouth in the morning.     fluticasone (FLONASE) 50 MCG/ACT nasal spray Place 1 spray into both nostrils daily as needed for allergies.     folic acid (FOLVITE) 505 MCG tablet Take 800 mcg by mouth in the morning.     lidocaine-prilocaine (EMLA) cream Apply 1 application topically as needed. 30 g 2   metoprolol succinate (TOPROL-XL) 50 MG 24 hr tablet TAKE 1 TABLET BY MOUTH  DAILY 90 tablet 3   mirtazapine (REMERON) 45 MG tablet Take 45 mg by mouth at bedtime.      nitroGLYCERIN (NITROSTAT) 0.4 MG SL tablet DISSOLVE 1 TABLET UNDER THE TONGUE EVERY 5 MINUTES AS  NEEDED FOR CHEST PAIN. MAX  OF 3 TABLETS IN 15 MINUTES. CALL 911 IF PAIN PERSISTS. 25 tablet 3   ondansetron (ZOFRAN) 4 MG tablet Take 4 mg by mouth every 8 (eight) hours as needed for nausea or vomiting.      pantoprazole (PROTONIX) 40 MG tablet Take 40 mg by mouth in the morning.     prochlorperazine (COMPAZINE) 10 MG tablet Take 1 tablet (10 mg total) by mouth every 6 (six) hours as needed for nausea or vomiting. 30 tablet 0   sertraline (ZOLOFT) 100 MG tablet Take 200 mg by mouth in the morning.     sucralfate (CARAFATE) 1 g tablet Take 1 tablet (1 g total) by mouth 4 (four) times daily. Dissolve each tablet in 15 cc water before use. 120 tablet 2   No current facility-administered medications for this visit.    SURGICAL HISTORY:  Past Surgical History:  Procedure Laterality Date   ABDOMINAL HYSTERECTOMY     APPENDECTOMY     BOWEL RESECTION N/A 08/01/2013   Procedure: SMALL BOWEL RESECTION;  Surgeon: Harl Bowie, MD;  Location: [place];  Service: General;   Laterality: N/A;   BRONCHIAL BIOPSY  07/02/2020   Procedure: BRONCHIAL BIOPSIES;  Surgeon: Collene Gobble, MD;  Location: [facility] ENDOSCOPY;  Service: Pulmonary;;   BRONCHIAL BRUSHINGS  07/02/2020   Procedure: BRONCHIAL BRUSHINGS;  Surgeon: Collene Gobble, MD;  Location: [facility] ENDOSCOPY;  Service: Pulmonary;;   BRONCHIAL NEEDLE ASPIRATION BIOPSY  07/02/2020   Procedure: BRONCHIAL NEEDLE ASPIRATION BIOPSIES;  Surgeon: Collene Gobble, MD;  Location: [facility] ENDOSCOPY;  Service: Pulmonary;;   BRONCHIAL WASHINGS  07/02/2020   Procedure: BRONCHIAL WASHINGS;  Surgeon:  Collene Gobble, MD;  Location: [facility] ENDOSCOPY;  Service: Pulmonary;;   cataract surgery Bilateral    CHOLECYSTECTOMY     COLON RESECTION     COLON SURGERY     COLOSTOMY N/A 08/01/2013   Procedure: COLOSTOMY;  Surgeon: Harl Bowie, MD;  Location: [place];  Service: General;  Laterality: N/A;   COLOSTOMY TAKEDOWN  01/24/2014   dr blackman   COLOSTOMY TAKEDOWN N/A 01/24/2014   Procedure: COLOSTOMY TAKEDOWN;  Surgeon: Coralie Keens, MD;  Location: [place];  Service: General;  Laterality: N/A;   CORONARY ANGIOPLASTY WITH STENT PLACEMENT  12/20/2012   STENT TO OM         DR COOPER   ENDARTERECTOMY Right 08/08/2014   Procedure: RIGHT CAROTID ENDARTERECTOMY WITH HEMASHIELD PATCH ANGIOPLASTY;  Surgeon: Elam Dutch, MD;  Location: [place];  Service: Vascular;  Laterality: Right;   FIDUCIAL MARKER PLACEMENT  07/02/2020   Procedure: FIDUCIAL MARKER PLACEMENT;  Surgeon: Collene Gobble, MD;  Location: [place] ENDOSCOPY;  Service: Pulmonary;;   FOOT SURGERY Left    HAND SURGERY Bilateral    for ganglion cysts   HEMOSTASIS CONTROL  07/02/2020   Procedure: HEMOSTASIS CONTROL;  Surgeon: Collene Gobble, MD;  Location: [place] ENDOSCOPY;  Service: Pulmonary;;  ice saline and epi   IR IMAGING GUIDED PORT INSERTION  08/23/2020   LAPAROTOMY N/A 08/01/2013   Procedure: EXPLORATORY LAPAROTOMY;  Surgeon: Harl Bowie, MD;  Location: [place];  Service: General;   Laterality: N/A;   LEFT HEART CATH AND CORONARY ANGIOGRAPHY N/A 01/10/2019   Procedure: LEFT HEART CATH AND CORONARY ANGIOGRAPHY;  Surgeon: Troy Sine, MD;  Location: [place] CV LAB;  Service: Cardiovascular;  Laterality: N/A;   LEFT HEART CATHETERIZATION WITH CORONARY ANGIOGRAM N/A 12/20/2012   Procedure: LEFT HEART CATHETERIZATION WITH CORONARY ANGIOGRAM;  Surgeon: Blane Ohara, MD;  Location: [facility] CATH LAB;  Service: Cardiovascular;  Laterality: N/A;   PARTIAL COLECTOMY N/A 08/01/2013   Procedure: PARTIAL COLECTOMY;  Surgeon: Harl Bowie, MD;  Location: [place];  Service: General;  Laterality: N/A;   ROTATOR CUFF REPAIR Left    TONSILLECTOMY     TRACHEOSTOMY     feinstein   VIDEO BRONCHOSCOPY WITH ENDOBRONCHIAL NAVIGATION N/A 07/02/2020   Procedure: VIDEO BRONCHOSCOPY WITH ENDOBRONCHIAL NAVIGATION;  Surgeon: Collene Gobble, MD;  Location: [place] ENDOSCOPY;  Service: Pulmonary;  Laterality: N/A;    REVIEW OF SYSTEMS:  A comprehensive review of systems was negative except for: Constitutional: positive for fatigue Respiratory: positive for cough Gastrointestinal: positive for odynophagia   PHYSICAL EXAMINATION: General appearance: alert, cooperative, fatigued, and no distress Head: Normocephalic, without obvious abnormality, atraumatic Neck: no adenopathy, no JVD, supple, symmetrical, trachea midline, and thyroid not enlarged, symmetric, no tenderness/mass/nodules Lymph nodes: Cervical, supraclavicular, and axillary nodes normal. Resp: clear to auscultation bilaterally Back: symmetric, no curvature. ROM normal. No CVA tenderness. Cardio: regular rate and rhythm, S1, S2 normal, no murmur, click, rub or gallop GI: soft, non-tender; bowel sounds normal; no masses,  no organomegaly Extremities: extremities normal, atraumatic, no cyanosis or edema  ECOG PERFORMANCE STATUS: 1 - Symptomatic but completely ambulatory  Blood pressure (!) 136/50, pulse 71, temperature (!) 96.8 F (36  C), temperature source Tympanic, resp. rate 19, height _0  (1.499 m), weight 147 lb 4.8 oz (66.8 kg), SpO2 98 %.  LABORATORY DATA: Lab Results  Component Value Date   WBC 3.8 (L) 08/27/2020   HGB 8.7 (L) 08/27/2020   HCT 26.6 (L) 08/27/2020  MCV 87.2 08/27/2020   PLT 83 (L) 08/27/2020      Chemistry      Component Value Date/Time   NA 144 08/20/2020 1000   NA 139 01/25/2019 1036   K 3.7 08/20/2020 1000   CL 111 08/20/2020 1000   CO2 23 08/20/2020 1000   BUN 25 (H) 08/20/2020 1000   BUN 12 01/25/2019 1036   CREATININE 1.24 (H) 08/20/2020 1000      Component Value Date/Time   CALCIUM 9.3 08/20/2020 1000   ALKPHOS 72 08/20/2020 1000   AST 15 08/20/2020 1000   ALT 14 08/20/2020 1000   BILITOT 0.8 08/20/2020 1000       RADIOGRAPHIC STUDIES: CT Angio Chest PE W/Cm &/Or Wo Cm  Result Date: 08/16/2020 CLINICAL DATA:  80 year old female with shortness of breath and abdominal pain. EXAM: CT ANGIOGRAPHY CHEST CT ABDOMEN AND PELVIS WITH CONTRAST TECHNIQUE: Multidetector CT imaging of the chest was performed using the standard protocol during bolus administration of intravenous contrast. Multiplanar CT image reconstructions and MIPs were obtained to evaluate the vascular anatomy. Multidetector CT imaging of the abdomen and pelvis was performed using the standard protocol during bolus administration of intravenous contrast. CONTRAST:  111m OMNIPAQUE IOHEXOL 350 MG/ML SOLN COMPARISON:  Chest radiograph dated 07/02/2020 and CT abdomen pelvis dated 08/29/2014. FINDINGS: Evaluation of this exam is limited due to respiratory motion artifact. CTA CHEST FINDINGS Cardiovascular: There is mild cardiomegaly. No pericardial effusion. There is a 3 vessel coronary vascular calcification. There is mild atherosclerotic calcification of thoracic aorta. No aneurysmal dilatation. Mildly dilated main pulmonary trunk may represent pulmonary hypertension. Clinical correlation is recommended. Evaluation of  the pulmonary arteries is limited due to respiratory motion artifact and suboptimal visualization of the peripheral branches. No definite pulmonary artery embolus identified. Mediastinum/Nodes: No hilar or mediastinal adenopathy. The esophagus is grossly unremarkable. No mediastinal fluid collection. Lungs/Pleura: Small bilateral pleural effusions. There is diffuse interstitial and interlobular septal prominence and edema. There is background of centrilobular emphysema. There is a 2 cm cavitary lesion with air-fluid level in the right lower lobe which may represent fungal infection, abscess. Other etiologies including TB or neoplasm is not excluded. Clinical correlation is recommended. Several adjacent metallic clips noted. This collection therefore may represent post bronchoscopy or biopsy changes. There is no pneumothorax. The central airways are patent. Musculoskeletal: There is osteopenia with degenerative changes of the spine. No acute osseous pathology. Review of the MIP images confirms the above findings. CT ABDOMEN and PELVIS FINDINGS No intra-abdominal free air or free fluid. Hepatobiliary: The liver is unremarkable. Mild intrahepatic biliary ductal dilatation. Cholecystectomy. Pancreas: Unremarkable. No pancreatic ductal dilatation or surrounding inflammatory changes. Spleen: Normal in size without focal abnormality. Adrenals/Urinary Tract: The adrenal glands are unremarkable. There is no hydronephrosis on either side. There is symmetric enhancement and excretion of contrast by both kidneys. The visualized ureters and urinary bladder appear unremarkable. Stomach/Bowel: There is postsurgical changes of bowel with multiple anastomotic sutures. There is no bowel obstruction or active inflammation. Appendectomy. Vascular/Lymphatic: Moderate aortoiliac atherosclerotic disease. The IVC is unremarkable. No portal venous gas. There is no adenopathy. Reproductive: Hysterectomy. No adnexal masses. Other: Midline  vertical anterior abdominal wall incisional scar. There is a broad-based ventral hernia containing a segment of the transverse colon without evidence of obstruction. Musculoskeletal: Osteopenia with degenerative changes of the spine and hips. No acute osseous pathology. Review of the MIP images confirms the above findings. IMPRESSION: 1. No CT evidence of pulmonary artery embolus. 2. A 2 cm  cavitary lesion with air-fluid level in the right lower lobe may represent fungal infection, abscess, or less likely malignancy. Clinical correlation and follow-up recommended. 3. Cardiomegaly with findings of CHF and small bilateral pleural effusions. 4. No acute intra-abdominal or pelvic pathology. 5. Aortic Atherosclerosis (ICD10-I70.0) and Emphysema (ICD10-J43.9). Electronically Signed   By: Anner Crete M.D.   On: 08/16/2020 00:41   CT Abdomen Pelvis W Contrast  Result Date: 08/16/2020 CLINICAL DATA:  80 year old female with shortness of breath and abdominal pain. EXAM: CT ANGIOGRAPHY CHEST CT ABDOMEN AND PELVIS WITH CONTRAST TECHNIQUE: Multidetector CT imaging of the chest was performed using the standard protocol during bolus administration of intravenous contrast. Multiplanar CT image reconstructions and MIPs were obtained to evaluate the vascular anatomy. Multidetector CT imaging of the abdomen and pelvis was performed using the standard protocol during bolus administration of intravenous contrast. CONTRAST:  146m OMNIPAQUE IOHEXOL 350 MG/ML SOLN COMPARISON:  Chest radiograph dated 07/02/2020 and CT abdomen pelvis dated 08/29/2014. FINDINGS: Evaluation of this exam is limited due to respiratory motion artifact. CTA CHEST FINDINGS Cardiovascular: There is mild cardiomegaly. No pericardial effusion. There is a 3 vessel coronary vascular calcification. There is mild atherosclerotic calcification of thoracic aorta. No aneurysmal dilatation. Mildly dilated main pulmonary trunk may represent pulmonary hypertension.  Clinical correlation is recommended. Evaluation of the pulmonary arteries is limited due to respiratory motion artifact and suboptimal visualization of the peripheral branches. No definite pulmonary artery embolus identified. Mediastinum/Nodes: No hilar or mediastinal adenopathy. The esophagus is grossly unremarkable. No mediastinal fluid collection. Lungs/Pleura: Small bilateral pleural effusions. There is diffuse interstitial and interlobular septal prominence and edema. There is background of centrilobular emphysema. There is a 2 cm cavitary lesion with air-fluid level in the right lower lobe which may represent fungal infection, abscess. Other etiologies including TB or neoplasm is not excluded. Clinical correlation is recommended. Several adjacent metallic clips noted. This collection therefore may represent post bronchoscopy or biopsy changes. There is no pneumothorax. The central airways are patent. Musculoskeletal: There is osteopenia with degenerative changes of the spine. No acute osseous pathology. Review of the MIP images confirms the above findings. CT ABDOMEN and PELVIS FINDINGS No intra-abdominal free air or free fluid. Hepatobiliary: The liver is unremarkable. Mild intrahepatic biliary ductal dilatation. Cholecystectomy. Pancreas: Unremarkable. No pancreatic ductal dilatation or surrounding inflammatory changes. Spleen: Normal in size without focal abnormality. Adrenals/Urinary Tract: The adrenal glands are unremarkable. There is no hydronephrosis on either side. There is symmetric enhancement and excretion of contrast by both kidneys. The visualized ureters and urinary bladder appear unremarkable. Stomach/Bowel: There is postsurgical changes of bowel with multiple anastomotic sutures. There is no bowel obstruction or active inflammation. Appendectomy. Vascular/Lymphatic: Moderate aortoiliac atherosclerotic disease. The IVC is unremarkable. No portal venous gas. There is no adenopathy. Reproductive:  Hysterectomy. No adnexal masses. Other: Midline vertical anterior abdominal wall incisional scar. There is a broad-based ventral hernia containing a segment of the transverse colon without evidence of obstruction. Musculoskeletal: Osteopenia with degenerative changes of the spine and hips. No acute osseous pathology. Review of the MIP images confirms the above findings. IMPRESSION: 1. No CT evidence of pulmonary artery embolus. 2. A 2 cm cavitary lesion with air-fluid level in the right lower lobe may represent fungal infection, abscess, or less likely malignancy. Clinical correlation and follow-up recommended. 3. Cardiomegaly with findings of CHF and small bilateral pleural effusions. 4. No acute intra-abdominal or pelvic pathology. 5. Aortic Atherosclerosis (ICD10-I70.0) and Emphysema (ICD10-J43.9). Electronically Signed   By: AAnner Crete  M.D.   On: 08/16/2020 00:41   IR IMAGING GUIDED PORT INSERTION  Result Date: 08/23/2020 CLINICAL DATA:  Lung cancer, access for chemotherapy EXAM: RIGHT INTERNAL JUGULAR SINGLE LUMEN POWER PORT CATHETER INSERTION Date:  08/23/2020 08/23/2020 3:30 pm Radiologist:  M. Daryll Brod, MD Guidance:  Ultrasound and fluoroscopic MEDICATIONS: 1% lidocaine with epinephrine ANESTHESIA/SEDATION: Versed 2.0 mg IV; Fentanyl 100 mcg IV; Moderate Sedation Time:  25 minutes The patient was continuously monitored during the procedure by the interventional radiology nurse under my direct supervision. FLUOROSCOPY TIME:  0 minutes, 42 seconds (5 mGy) COMPLICATIONS: None immediate. CONTRAST:  None. PROCEDURE: Informed consent was obtained from the patient following explanation of the procedure, risks, benefits and alternatives. The patient understands, agrees and consents for the procedure. All questions were addressed. A time out was performed. Maximal barrier sterile technique utilized including caps, mask, sterile gowns, sterile gloves, large sterile drape, hand hygiene, and 2% chlorhexidine  scrub. Under sterile conditions and local anesthesia, right internal jugular micropuncture venous access was performed. Access was performed with ultrasound. Images were obtained for documentation of the patent right internal jugular vein. A guide wire was inserted followed by a transitional dilator. This allowed insertion of a guide wire and catheter into the IVC. Measurements were obtained from the SVC / RA junction back to the right IJ venotomy site. In the right infraclavicular chest, a subcutaneous pocket was created over the second anterior rib. This was done under sterile conditions and local anesthesia. 1% lidocaine with epinephrine was utilized for this. A 2.5 cm incision was made in the skin. Blunt dissection was performed to create a subcutaneous pocket over the right pectoralis major muscle. The pocket was flushed with saline vigorously. There was adequate hemostasis. The port catheter was assembled and checked for leakage. The port catheter was secured in the pocket with two retention sutures. The tubing was tunneled subcutaneously to the right venotomy site and inserted into the SVC/RA junction through a valved peel-away sheath. Position was confirmed with fluoroscopy. Images were obtained for documentation. The patient tolerated the procedure well. No immediate complications. Incisions were closed in a two layer fashion with 4 - 0 Vicryl suture. Dermabond was applied to the skin. The port catheter was accessed, blood was aspirated followed by saline and heparin flushes. Needle was removed. A dry sterile dressing was applied. IMPRESSION: Ultrasound and fluoroscopically guided right internal jugular single lumen power port catheter insertion. Tip in the SVC/RA junction. Catheter ready for use. Electronically Signed   By: Jerilynn Mages.  Shick M.D.   On: 08/23/2020 15:43    ASSESSMENT AND PLAN: This is a very pleasant 80 years old white female recently diagnosed with a stage IIIb (T1b, N3, M0) non-small cell lung  cancer, squamous cell carcinoma presented with right lower lobe lung nodule in addition to bilateral mediastinal and right hilar adenopathy diagnosed in June 2022 with PD-L1 expression of 30%. The patient had MRI of the brain performed less than 2 weeks ago and it showed no concerning finding for metastatic disease to the brain. The patient is currently undergoing a course of concurrent chemoradiation with weekly carboplatin for AUC of 2 and paclitaxel 45 Mg/M2.  First dose of chemotherapy on 07/30/2020.  Status post 4 cycles of treatment. The patient has been tolerating this treatment well except for fatigue and odynophagia started recently.  She is currently on Carafate for the odynophagia with no significant improvement and she is followed by radiation oncology for management of this condition. Her platelet count are  low today and I recommended for the patient to hold her treatment to cycle #5 today.  We will resume her treatment next week if the patient is feeling better. The patient will come back for follow-up visit in 2 weeks for evaluation before starting cycle #6. She was advised to call immediately if she has any other concerning symptoms in the interval. The patient voices understanding of current disease status and treatment options and is in agreement with the current care plan.  All questions were answered. The patient knows to call the clinic with any problems, questions or concerns. We can certainly see the patient much sooner if necessary.   Disclaimer: This note was dictated with voice recognition software. Similar sounding words can inadvertently be transcribed and may not be corrected upon review.

## 2020-08-28 ENCOUNTER — Ambulatory Visit
Admission: RE | Admit: 2020-08-28 | Discharge: 2020-08-28 | Disposition: A | Discharge status: Home / Self Care | Payer: Medicare Other | Source: Ambulatory Visit | Attending: Radiation Oncology | Admitting: Radiation Oncology

## 2020-08-28 DIAGNOSIS — Z510 Encounter for antineoplastic radiation therapy: Secondary | ICD-10-CM | POA: Diagnosis not present

## 2020-08-28 DIAGNOSIS — C3431 Malignant neoplasm of lower lobe, right bronchus or lung: Secondary | ICD-10-CM | POA: Diagnosis not present

## 2020-08-29 ENCOUNTER — Ambulatory Visit
Admission: RE | Admit: 2020-08-29 | Discharge: 2020-08-29 | Disposition: A | Discharge status: Home / Self Care | Payer: Medicare Other | Source: Ambulatory Visit | Attending: Radiation Oncology | Admitting: Radiation Oncology

## 2020-08-29 ENCOUNTER — Other Ambulatory Visit: Payer: Self-pay

## 2020-08-29 DIAGNOSIS — C3431 Malignant neoplasm of lower lobe, right bronchus or lung: Secondary | ICD-10-CM | POA: Diagnosis not present

## 2020-08-29 DIAGNOSIS — Z510 Encounter for antineoplastic radiation therapy: Secondary | ICD-10-CM | POA: Diagnosis not present

## 2020-08-30 ENCOUNTER — Ambulatory Visit
Admission: RE | Admit: 2020-08-30 | Discharge: 2020-08-30 | Disposition: A | Discharge status: Home / Self Care | Payer: Medicare Other | Source: Ambulatory Visit | Attending: Radiation Oncology | Admitting: Radiation Oncology

## 2020-08-30 DIAGNOSIS — C3431 Malignant neoplasm of lower lobe, right bronchus or lung: Secondary | ICD-10-CM | POA: Diagnosis not present

## 2020-08-30 DIAGNOSIS — Z510 Encounter for antineoplastic radiation therapy: Secondary | ICD-10-CM | POA: Diagnosis not present

## 2020-08-31 ENCOUNTER — Ambulatory Visit
Admission: RE | Admit: 2020-08-31 | Discharge: 2020-08-31 | Disposition: A | Discharge status: Home / Self Care | Payer: Medicare Other | Source: Ambulatory Visit | Attending: Radiation Oncology | Admitting: Radiation Oncology

## 2020-08-31 ENCOUNTER — Other Ambulatory Visit: Payer: Self-pay

## 2020-08-31 DIAGNOSIS — C3431 Malignant neoplasm of lower lobe, right bronchus or lung: Secondary | ICD-10-CM | POA: Diagnosis not present

## 2020-08-31 DIAGNOSIS — Z510 Encounter for antineoplastic radiation therapy: Secondary | ICD-10-CM | POA: Diagnosis not present

## 2020-09-03 ENCOUNTER — Inpatient Hospital Stay: Payer: Medicare Other

## 2020-09-03 ENCOUNTER — Other Ambulatory Visit: Payer: Medicare Other

## 2020-09-03 ENCOUNTER — Other Ambulatory Visit: Payer: Self-pay

## 2020-09-03 ENCOUNTER — Ambulatory Visit
Admission: RE | Admit: 2020-09-03 | Discharge: 2020-09-03 | Disposition: A | Discharge status: Home / Self Care | Payer: Medicare Other | Source: Ambulatory Visit | Attending: Radiation Oncology | Admitting: Radiation Oncology

## 2020-09-03 VITALS — BP 138/65 | HR 80 | Temp 98.7°F | Resp 18 | Wt 150.0 lb

## 2020-09-03 DIAGNOSIS — E876 Hypokalemia: Secondary | ICD-10-CM | POA: Diagnosis not present

## 2020-09-03 DIAGNOSIS — C3431 Malignant neoplasm of lower lobe, right bronchus or lung: Secondary | ICD-10-CM

## 2020-09-03 DIAGNOSIS — C3491 Malignant neoplasm of unspecified part of right bronchus or lung: Secondary | ICD-10-CM

## 2020-09-03 DIAGNOSIS — R5383 Other fatigue: Secondary | ICD-10-CM | POA: Diagnosis not present

## 2020-09-03 DIAGNOSIS — K219 Gastro-esophageal reflux disease without esophagitis: Secondary | ICD-10-CM | POA: Diagnosis not present

## 2020-09-03 DIAGNOSIS — Z510 Encounter for antineoplastic radiation therapy: Secondary | ICD-10-CM | POA: Diagnosis not present

## 2020-09-03 DIAGNOSIS — Z5111 Encounter for antineoplastic chemotherapy: Secondary | ICD-10-CM | POA: Diagnosis not present

## 2020-09-03 DIAGNOSIS — R1310 Dysphagia, unspecified: Secondary | ICD-10-CM | POA: Diagnosis not present

## 2020-09-03 LAB — CMP (CANCER CENTER ONLY)
ALT: 9 U/L (ref 0–44)
AST: 12 U/L — ABNORMAL LOW (ref 15–41)
Albumin: 3.4 g/dL — ABNORMAL LOW (ref 3.5–5.0)
Alkaline Phosphatase: 79 U/L (ref 38–126)
Anion gap: 10 (ref 5–15)
BUN: 17 mg/dL (ref 8–23)
CO2: 23 mmol/L (ref 22–32)
Calcium: 8.6 mg/dL — ABNORMAL LOW (ref 8.9–10.3)
Chloride: 113 mmol/L — ABNORMAL HIGH (ref 98–111)
Creatinine: 0.96 mg/dL (ref 0.44–1.00)
GFR, Estimated: 59 mL/min — ABNORMAL LOW (ref >60)
Glucose, Bld: 114 mg/dL — ABNORMAL HIGH (ref 70–99)
Potassium: 3.7 mmol/L (ref 3.5–5.1)
Sodium: 146 mmol/L — ABNORMAL HIGH (ref 135–145)
Total Bilirubin: 0.8 mg/dL (ref 0.3–1.2)
Total Protein: 6 g/dL — ABNORMAL LOW (ref 6.5–8.1)

## 2020-09-03 LAB — CBC WITH DIFFERENTIAL (CANCER CENTER ONLY)
Abs Immature Granulocytes: 0.02 10*3/uL (ref 0.00–0.07)
Basophils Absolute: 0 10*3/uL (ref 0.0–0.1)
Basophils Relative: 0 %
Eosinophils Absolute: 0.1 10*3/uL (ref 0.0–0.5)
Eosinophils Relative: 2 %
HCT: 25.9 % — ABNORMAL LOW (ref 36.0–46.0)
Hemoglobin: 8.6 g/dL — ABNORMAL LOW (ref 12.0–15.0)
Immature Granulocytes: 1 %
Lymphocytes Relative: 23 %
Lymphs Abs: 0.5 10*3/uL — ABNORMAL LOW (ref 0.7–4.0)
MCH: 29.3 pg (ref 26.0–34.0)
MCHC: 33.2 g/dL (ref 30.0–36.0)
MCV: 88.1 fL (ref 80.0–100.0)
Monocytes Absolute: 0.2 10*3/uL (ref 0.1–1.0)
Monocytes Relative: 9 %
Neutro Abs: 1.5 10*3/uL — ABNORMAL LOW (ref 1.7–7.7)
Neutrophils Relative %: 65 %
Platelet Count: 86 10*3/uL — ABNORMAL LOW (ref 150–400)
RBC: 2.94 MIL/uL — ABNORMAL LOW (ref 3.87–5.11)
RDW: 18.9 % — ABNORMAL HIGH (ref 11.5–15.5)
WBC Count: 2.3 10*3/uL — ABNORMAL LOW (ref 4.0–10.5)
nRBC: 0 % (ref 0.0–0.2)

## 2020-09-03 MED ORDER — SODIUM CHLORIDE 0.9 % IV SOLN: Freq: Once | INTRAVENOUS | Status: AC

## 2020-09-03 MED ORDER — HEPARIN SOD (PORK) LOCK FLUSH 100 UNIT/ML IV SOLN
500.0000 [IU] | Freq: Once | INTRAVENOUS | Status: AC | PRN
  Administered 2020-09-03: 500 [IU]

## 2020-09-03 MED ORDER — SODIUM CHLORIDE 0.9 % IV SOLN
145.0000 mg | Freq: Once | INTRAVENOUS | Status: AC
  Administered 2020-09-03: 150 mg via INTRAVENOUS
  Filled 2020-09-03: qty 15

## 2020-09-03 MED ORDER — SODIUM CHLORIDE 0.9% FLUSH
10.0000 mL | INTRAVENOUS | Status: DC | PRN
  Administered 2020-09-03: 10 mL

## 2020-09-03 MED ORDER — PACLITAXEL PROTEIN-BOUND CHEMO INJECTION 100 MG
40.0000 mg/m2 | Freq: Once | INTRAVENOUS | Status: AC
  Administered 2020-09-03: 75 mg via INTRAVENOUS
  Filled 2020-09-03: qty 15

## 2020-09-03 MED ORDER — PALONOSETRON HCL INJECTION 0.25 MG/5ML
0.2500 mg | Freq: Once | INTRAVENOUS | Status: AC
  Administered 2020-09-03: 0.25 mg via INTRAVENOUS
  Filled 2020-09-03: qty 5

## 2020-09-03 MED ORDER — SODIUM CHLORIDE 0.9 % IV SOLN
20.0000 mg | Freq: Once | INTRAVENOUS | Status: AC
  Administered 2020-09-03: 20 mg via INTRAVENOUS
  Filled 2020-09-03: qty 20
  Filled 2020-09-03: qty 2

## 2020-09-03 NOTE — Patient Instructions (Signed)
[facility]  Discharge Instructions: Thank you for choosing [facility] to provide your oncology and hematology care.   If you have a lab appointment with the [facility], please go directly to the [facility] and check in at the registration area.   Wear comfortable clothing and clothing appropriate for easy access to any Portacath or PICC line.   We strive to give you quality time with your provider. You may need to reschedule your appointment if you arrive late (15 or more minutes).  Arriving late affects you and other patients whose appointments are after yours.  Also, if you miss three or more appointments without notifying the office, you may be dismissed from the clinic at the provider's discretion.      For prescription refill requests, have your pharmacy contact our office and allow 72 hours for refills to be completed.    Today you received the following chemotherapy and/or immunotherapy agents: Abraxane, Carboplatin   To help prevent nausea and vomiting after your treatment, we encourage you to take your nausea medication as directed.  BELOW ARE SYMPTOMS THAT SHOULD BE REPORTED IMMEDIATELY: *FEVER GREATER THAN 100.4 F (38 C) OR HIGHER *CHILLS OR SWEATING *NAUSEA AND VOMITING THAT IS NOT CONTROLLED WITH YOUR NAUSEA MEDICATION *UNUSUAL SHORTNESS OF BREATH *UNUSUAL BRUISING OR BLEEDING *URINARY PROBLEMS (pain or burning when urinating, or frequent urination) *BOWEL PROBLEMS (unusual diarrhea, constipation, pain near the anus) TENDERNESS IN MOUTH AND THROAT WITH OR WITHOUT PRESENCE OF ULCERS (sore throat, sores in mouth, or a toothache) UNUSUAL RASH, SWELLING OR PAIN  UNUSUAL VAGINAL DISCHARGE OR ITCHING   Items with * indicate a potential emergency and should be followed up as soon as possible or go to the Emergency Department if any problems should occur.  Please show the CHEMOTHERAPY ALERT CARD or IMMUNOTHERAPY ALERT CARD at  check-in to the Emergency Department and triage nurse.  Should you have questions after your visit or need to cancel or reschedule your appointment, please contact [facility]  Dept: [phone]  and follow the prompts.  Office hours are 8:00 a.m. to 4:30 p.m. Monday - Friday. Please note that voicemails left after 4:00 p.m. may not be returned until the following business day.  We are closed weekends and major holidays. You have access to a nurse at all times for urgent questions. Please call the main number to the clinic Dept: [phone] and follow the prompts.   For any non-urgent questions, you may also contact your provider using MyChart. We now offer e-Visits for anyone 77 and older to request care online for non-urgent symptoms. For details visit [email or web address].   Also download the MyChart app! Go to the app store, search "MyChart", open the app, select [facility], and log in with your MyChart username and password.  Due to Covid, a mask is required upon entering the hospital/clinic. If you do not have a mask, one will be given to you upon arrival. For doctor visits, patients may have 1 support person aged 66 or older with them. For treatment visits, patients cannot have anyone with them due to current Covid guidelines and our immunocompromised population.

## 2020-09-03 NOTE — Progress Notes (Signed)
Ok to trt with Plt 86 per Dr. Julien Nordmann

## 2020-09-03 NOTE — Patient Instructions (Signed)
Implanted Port Home Guide An implanted port is a device that is placed under the skin. It is usually placed in the chest. The device can be used to give IV medicine, to take blood, or for dialysis. You may have an implanted port if: You need IV medicine that would be irritating to the small veins in your hands or arms. You need IV medicines, such as antibiotics, for a long period of time. You need IV nutrition for a long period of time. You need dialysis. When you have a port, your health care provider can choose to use the port instead of veins in your arms for these procedures. You may have fewer limitations when using a port than you would if you used other types of long-term IVs, and you will likely be able to return to normal activities afteryour incision heals. An implanted port has two main parts: Reservoir. The reservoir is the part where a needle is inserted to give medicines or draw blood. The reservoir is round. After it is placed, it appears as a small, raised area under your skin. Catheter. The catheter is a thin, flexible tube that connects the reservoir to a vein. Medicine that is inserted into the reservoir goes into the catheter and then into the vein. How is my port accessed? To access your port: A numbing cream may be placed on the skin over the port site. Your health care provider will put on a mask and sterile gloves. The skin over your port will be cleaned carefully with a germ-killing soap and allowed to dry. Your health care provider will gently pinch the port and insert a needle into it. Your health care provider will check for a blood return to make sure the port is in the vein and is not clogged. If your port needs to remain accessed to get medicine continuously (constant infusion), your health care provider will place a clear bandage (dressing) over the needle site. The dressing and needle will need to be changed every week, or as told by your health care provider. What  is flushing? Flushing helps keep the port from getting clogged. Follow instructions from your health care provider about how and when to flush the port. Ports are usually flushed with saline solution or a medicine called heparin. The need for flushing will depend on how the port is used: If the port is only used from time to time to give medicines or draw blood, the port may need to be flushed: Before and after medicines have been given. Before and after blood has been drawn. As part of routine maintenance. Flushing may be recommended every 4-6 weeks. If a constant infusion is running, the port may not need to be flushed. Throw away any syringes in a disposal container that is meant for sharp items (sharps container). You can buy a sharps container from a pharmacy, or you can make one by using an empty hard plastic bottle with a cover. How long will my port stay implanted? The port can stay in for as long as your health care provider thinks it is needed. When it is time for the port to come out, a surgery will be done to remove it. The surgery will be similar to the procedure that was done to putthe port in. Follow these instructions at home:  Flush your port as told by your health care provider. If you need an infusion over several days, follow instructions from your health care provider about how to take   care of your port site. Make sure you: Wash your hands with soap and water before you change your dressing. If soap and water are not available, use alcohol-based hand sanitizer. Change your dressing as told by your health care provider. Place any used dressings or infusion bags into a plastic bag. Throw that bag in the trash. Keep the dressing that covers the needle clean and dry. Do not get it wet. Do not use scissors or sharp objects near the tube. Keep the tube clamped, unless it is being used. Check your port site every day for signs of infection. Check for: Redness, swelling, or  pain. Fluid or blood. Pus or a bad smell. Protect the skin around the port site. Avoid wearing bra straps that rub or irritate the site. Protect the skin around your port from seat belts. Place a soft pad over your chest if needed. Bathe or shower as told by your health care provider. The site may get wet as long as you are not actively receiving an infusion. Return to your normal activities as told by your health care provider. Ask your health care provider what activities are safe for you. Carry a medical alert card or wear a medical alert bracelet at all times. This will let health care providers know that you have an implanted port in case of an emergency. Get help right away if: You have redness, swelling, or pain at the port site. You have fluid or blood coming from your port site. You have pus or a bad smell coming from the port site. You have a fever. Summary Implanted ports are usually placed in the chest for long-term IV access. Follow instructions from your health care provider about flushing the port and changing bandages (dressings). Take care of the area around your port by avoiding clothing that puts pressure on the area, and by watching for signs of infection. Protect the skin around your port from seat belts. Place a soft pad over your chest if needed. Get help right away if you have a fever or you have redness, swelling, pain, drainage, or a bad smell at the port site. This information is not intended to replace advice given to you by your health care provider. Make sure you discuss any questions you have with your healthcare provider. Document Revised: 05/23/2019 Document Reviewed: 05/23/2019 [employer]vier Patient Education  2022 [employer]  

## 2020-09-04 ENCOUNTER — Ambulatory Visit
Admission: RE | Admit: 2020-09-04 | Discharge: 2020-09-04 | Disposition: A | Discharge status: Home / Self Care | Payer: Medicare Other | Source: Ambulatory Visit | Attending: Radiation Oncology | Admitting: Radiation Oncology

## 2020-09-04 DIAGNOSIS — Z510 Encounter for antineoplastic radiation therapy: Secondary | ICD-10-CM | POA: Diagnosis not present

## 2020-09-04 DIAGNOSIS — C3431 Malignant neoplasm of lower lobe, right bronchus or lung: Secondary | ICD-10-CM | POA: Diagnosis not present

## 2020-09-05 ENCOUNTER — Ambulatory Visit
Admission: RE | Admit: 2020-09-05 | Discharge: 2020-09-05 | Disposition: A | Discharge status: Home / Self Care | Payer: Medicare Other | Source: Ambulatory Visit | Attending: Radiation Oncology | Admitting: Radiation Oncology

## 2020-09-05 ENCOUNTER — Other Ambulatory Visit: Payer: Self-pay

## 2020-09-05 DIAGNOSIS — R0789 Other chest pain: Secondary | ICD-10-CM | POA: Diagnosis not present

## 2020-09-05 DIAGNOSIS — R0602 Shortness of breath: Secondary | ICD-10-CM | POA: Diagnosis not present

## 2020-09-05 DIAGNOSIS — U071 COVID-19: Secondary | ICD-10-CM | POA: Diagnosis not present

## 2020-09-05 DIAGNOSIS — Z510 Encounter for antineoplastic radiation therapy: Secondary | ICD-10-CM | POA: Diagnosis not present

## 2020-09-05 DIAGNOSIS — I313 Pericardial effusion (noninflammatory): Secondary | ICD-10-CM | POA: Diagnosis not present

## 2020-09-05 DIAGNOSIS — E876 Hypokalemia: Secondary | ICD-10-CM | POA: Diagnosis not present

## 2020-09-05 DIAGNOSIS — C3431 Malignant neoplasm of lower lobe, right bronchus or lung: Secondary | ICD-10-CM | POA: Diagnosis not present

## 2020-09-05 DIAGNOSIS — I4891 Unspecified atrial fibrillation: Secondary | ICD-10-CM | POA: Diagnosis not present

## 2020-09-05 DIAGNOSIS — J90 Pleural effusion, not elsewhere classified: Secondary | ICD-10-CM | POA: Diagnosis not present

## 2020-09-05 DIAGNOSIS — R0902 Hypoxemia: Secondary | ICD-10-CM | POA: Diagnosis not present

## 2020-09-05 DIAGNOSIS — R079 Chest pain, unspecified: Secondary | ICD-10-CM | POA: Diagnosis not present

## 2020-09-05 DIAGNOSIS — A419 Sepsis, unspecified organism: Secondary | ICD-10-CM | POA: Diagnosis not present

## 2020-09-05 DIAGNOSIS — I517 Cardiomegaly: Secondary | ICD-10-CM | POA: Diagnosis not present

## 2020-09-05 DIAGNOSIS — R599 Enlarged lymph nodes, unspecified: Secondary | ICD-10-CM | POA: Diagnosis not present

## 2020-09-05 DIAGNOSIS — R911 Solitary pulmonary nodule: Secondary | ICD-10-CM | POA: Diagnosis not present

## 2020-09-05 DIAGNOSIS — R000 Tachycardia, unspecified: Secondary | ICD-10-CM | POA: Diagnosis not present

## 2020-09-05 DIAGNOSIS — R6520 Severe sepsis without septic shock: Secondary | ICD-10-CM | POA: Diagnosis not present

## 2020-09-06 ENCOUNTER — Ambulatory Visit: Payer: Medicare Other

## 2020-09-06 DIAGNOSIS — R599 Enlarged lymph nodes, unspecified: Secondary | ICD-10-CM | POA: Diagnosis not present

## 2020-09-06 DIAGNOSIS — I313 Pericardial effusion (noninflammatory): Secondary | ICD-10-CM | POA: Diagnosis not present

## 2020-09-06 DIAGNOSIS — R911 Solitary pulmonary nodule: Secondary | ICD-10-CM | POA: Diagnosis not present

## 2020-09-06 DIAGNOSIS — T451X5A Adverse effect of antineoplastic and immunosuppressive drugs, initial encounter: Secondary | ICD-10-CM | POA: Diagnosis not present

## 2020-09-06 DIAGNOSIS — I2510 Atherosclerotic heart disease of native coronary artery without angina pectoris: Secondary | ICD-10-CM | POA: Diagnosis not present

## 2020-09-06 DIAGNOSIS — R000 Tachycardia, unspecified: Secondary | ICD-10-CM | POA: Diagnosis not present

## 2020-09-06 DIAGNOSIS — I340 Nonrheumatic mitral (valve) insufficiency: Secondary | ICD-10-CM | POA: Diagnosis not present

## 2020-09-06 DIAGNOSIS — K219 Gastro-esophageal reflux disease without esophagitis: Secondary | ICD-10-CM | POA: Diagnosis not present

## 2020-09-06 DIAGNOSIS — Z7982 Long term (current) use of aspirin: Secondary | ICD-10-CM | POA: Diagnosis not present

## 2020-09-06 DIAGNOSIS — C3490 Malignant neoplasm of unspecified part of unspecified bronchus or lung: Secondary | ICD-10-CM | POA: Diagnosis not present

## 2020-09-06 DIAGNOSIS — Z792 Long term (current) use of antibiotics: Secondary | ICD-10-CM | POA: Diagnosis not present

## 2020-09-06 DIAGNOSIS — Z79899 Other long term (current) drug therapy: Secondary | ICD-10-CM | POA: Diagnosis not present

## 2020-09-06 DIAGNOSIS — I509 Heart failure, unspecified: Secondary | ICD-10-CM | POA: Diagnosis not present

## 2020-09-06 DIAGNOSIS — R0602 Shortness of breath: Secondary | ICD-10-CM | POA: Diagnosis not present

## 2020-09-06 DIAGNOSIS — E785 Hyperlipidemia, unspecified: Secondary | ICD-10-CM | POA: Diagnosis not present

## 2020-09-06 DIAGNOSIS — D61818 Other pancytopenia: Secondary | ICD-10-CM | POA: Diagnosis not present

## 2020-09-06 DIAGNOSIS — U071 COVID-19: Secondary | ICD-10-CM | POA: Diagnosis not present

## 2020-09-06 DIAGNOSIS — I471 Supraventricular tachycardia: Secondary | ICD-10-CM | POA: Diagnosis not present

## 2020-09-06 DIAGNOSIS — J9601 Acute respiratory failure with hypoxia: Secondary | ICD-10-CM | POA: Diagnosis not present

## 2020-09-06 DIAGNOSIS — D61810 Antineoplastic chemotherapy induced pancytopenia: Secondary | ICD-10-CM | POA: Diagnosis not present

## 2020-09-06 DIAGNOSIS — F17210 Nicotine dependence, cigarettes, uncomplicated: Secondary | ICD-10-CM | POA: Diagnosis not present

## 2020-09-06 DIAGNOSIS — R6520 Severe sepsis without septic shock: Secondary | ICD-10-CM | POA: Diagnosis not present

## 2020-09-06 DIAGNOSIS — A419 Sepsis, unspecified organism: Secondary | ICD-10-CM | POA: Diagnosis not present

## 2020-09-06 DIAGNOSIS — A4189 Other specified sepsis: Secondary | ICD-10-CM | POA: Diagnosis not present

## 2020-09-06 DIAGNOSIS — J1282 Pneumonia due to coronavirus disease 2019: Secondary | ICD-10-CM | POA: Diagnosis not present

## 2020-09-06 DIAGNOSIS — J440 Chronic obstructive pulmonary disease with acute lower respiratory infection: Secondary | ICD-10-CM | POA: Diagnosis not present

## 2020-09-06 DIAGNOSIS — I110 Hypertensive heart disease with heart failure: Secondary | ICD-10-CM | POA: Diagnosis not present

## 2020-09-06 DIAGNOSIS — I4891 Unspecified atrial fibrillation: Secondary | ICD-10-CM | POA: Diagnosis not present

## 2020-09-06 DIAGNOSIS — I517 Cardiomegaly: Secondary | ICD-10-CM | POA: Diagnosis not present

## 2020-09-06 DIAGNOSIS — I252 Old myocardial infarction: Secondary | ICD-10-CM | POA: Diagnosis not present

## 2020-09-06 DIAGNOSIS — J90 Pleural effusion, not elsewhere classified: Secondary | ICD-10-CM | POA: Diagnosis not present

## 2020-09-06 DIAGNOSIS — R6521 Severe sepsis with septic shock: Secondary | ICD-10-CM | POA: Diagnosis not present

## 2020-09-06 DIAGNOSIS — F32A Depression, unspecified: Secondary | ICD-10-CM | POA: Diagnosis not present

## 2020-09-07 ENCOUNTER — Ambulatory Visit: Payer: Medicare Other

## 2020-09-10 ENCOUNTER — Telehealth: Payer: Self-pay | Admitting: Internal Medicine

## 2020-09-10 ENCOUNTER — Inpatient Hospital Stay: Payer: Medicare Other

## 2020-09-10 ENCOUNTER — Ambulatory Visit: Payer: Medicare Other | Admitting: Radiation Oncology

## 2020-09-10 ENCOUNTER — Ambulatory Visit: Payer: Medicare Other

## 2020-09-10 ENCOUNTER — Other Ambulatory Visit: Payer: Medicare Other

## 2020-09-10 ENCOUNTER — Inpatient Hospital Stay: Payer: Medicare Other | Admitting: Internal Medicine

## 2020-09-10 NOTE — Telephone Encounter (Signed)
Scheduled appts per 8/22 sch msg. Called pt, no answer. Left msg with appts date and times.

## 2020-09-11 ENCOUNTER — Ambulatory Visit: Payer: Medicare Other | Admitting: Radiation Oncology

## 2020-09-11 ENCOUNTER — Ambulatory Visit: Payer: Medicare Other

## 2020-09-12 ENCOUNTER — Ambulatory Visit: Payer: Medicare Other

## 2020-09-12 ENCOUNTER — Ambulatory Visit: Payer: Medicare Other | Admitting: Radiation Oncology

## 2020-09-13 ENCOUNTER — Ambulatory Visit: Payer: Medicare Other

## 2020-09-13 NOTE — Progress Notes (Signed)
[facility] OFFICE PROGRESS NOTE  [name], MD [place] [address]  DIAGNOSIS: Stage IIIB (T1b, N3, M0) non-small cell lung cancer, squamous cell carcinoma presented with right lower lobe lung nodule in addition to bilateral mediastinal and right hilar lymphadenopathy diagnosed in June 2022.   PDL TPS 30%.  PRIOR THERAPY: None  CURRENT THERAPY: A course of concurrent chemoradiation with weekly carboplatin for AUC of 2 and paclitaxel 45 Mg/M2.  First cycle July 30, 2020. Status post 5 cycles. Dr. Julien Nordmann changed paclitaxel to abraxane 40 mg/m2 starting from cycle #4 due to a reaction to paclitaxel.   INTERVAL HISTORY: [name] 80 y.o. female returns to the clinic today for a follow-up visit accompanied by her husband. The patient was recently hospitalized at [facility] due to COVID-19 infection. She believes she was hospitalized for 1 week. She believes she was discharged around 8/20. She has a routine follow up with her PCP on Thursday of this week.   Her treatment last week was held. Overall, she reports odynophagia from the radiation. She is taking carafate and PPI. She lost about 9 lbs in the last month per chart review. Her last day of radiation is scheduled for 09/25/20. She likely is not a good candidate for viscous lidocaine.    She denies any fever, chills, or night sweats. Her breathing is improving from her COVID-19 infection but she continues to have dyspnea on exertion and cough. She denies chest pain or hemoptysis. She reports she has had  diarrhea since being in the hospital. She has 2-3 "liquid" stools. She has not tried taking imodium. Unclear if she had anti-biotics in the hospital. It looks like she had a GI pathogen panel performed while in the hospital on 7/29 which was negative. The C. Diff PCR was negative as well. She has mild nausea for which compazine is effective for her. She denies vomiting or  constipation. She denies any headache or visual changes.  The patient is here today for evaluation before starting cycle #6.     MEDICAL HISTORY: Past Medical History:  Diagnosis Date   Acute respiratory failure with hypoxia s/p tracheostomy 07/20/2013   Anemia    a. 7-08/2013 felt due to recent critical illness/chronic disease.   Arthritis    handds, knees & back    Barrett's esophagus    CAD (coronary artery disease)    a. Mod dz 2010 initially mgd medically. b. 12/2012 - angina s/p PTCA/DES to mid-circumflex, PTCA/DES to first OM.    Carotid artery disease ([place])    a. 60-70% bilat ICA stenosis by dopplers 05/2012.   Chronic back pain    deteriorating;DDD   Colonic obstruction due to suspected colitis; s/p colectomy/colostomy    a. See SBO.   Complication of anesthesia    hard to wake up from anesthesia    COPD (chronic obstructive pulmonary disease) (HCC)    Depression    takes Remeron and Zoloft daily   Dizziness    was on Bentyl which caused this-took off of it and no problems since   DVT of axillary vein, acute right ([place])    a. Dx 07/2013 felt due to R IJ central line that had been inserted 7/14. Not on anticoag due to GIB issues and small cerebral bleed.   GERD (gastroesophageal reflux disease)    Heart murmur    History of bronchitis    > 75yrs ago    History of  colon polyps    History of hiatal hernia    History of kidney stones    was told it was stable but doesn't know for sure that she ever passed it   HTN (hypertension)    Hypertension    takes Metoprolol daily   Intracranial hemorrhage ([place])    a. 08/11/13 - per DC summary, tiny SAH vs SDH done for altered mentation, anticoag stopped including aspirin.   Joint pain    Neck pain    DDD   Other and unspecified hyperlipidemia    takes Lipitor daily   Pneumonia    hx of->15 yrs ago   Protein-calorie malnutrition, severe w/ electrolyte imbalance    a. Severe hypoalbuminemia leading to 3rd spacing including  anasarca and pulm edema 07/2013.   SBO (small bowel obstruction) ([place])    a. Lysis of adhesions and ovarian cystectomy 04/2013 for sBO. b. Admitted 07/2013 for colonic obstruction due to suspected colitis, s/p partial colectomy/colostomy 08/01/13. Admission complicated by anasarca, acute resp failure requiring tracheostomy, decannulated 08/25/13. c. Recurrent SBO8/2015, NGT placed for decompression.    Stroke [facility]) early 80's   right sided weakness--TIA    ALLERGIES:  is allergic to pregabalin and sulfonamide derivatives.  MEDICATIONS:  Current Outpatient Medications  Medication Sig Dispense Refill   aspirin EC 81 MG tablet Take 2 tablets (162 mg total) by mouth in the morning. Okay to restart this medication on 07/04/2020 30 tablet 11   atorvastatin (LIPITOR) 80 MG tablet Take 80 mg by mouth every evening.      buPROPion (WELLBUTRIN XL) 150 MG 24 hr tablet Take 1 tablet (150 mg total) by mouth daily. 90 tablet 3   CALCIUM PO Take 1 tablet by mouth in the morning.     Cholecalciferol (VITAMIN D3 PO) Take 1 tablet by mouth in the morning.     fish oil-omega-3 fatty acids 1000 MG capsule Take 1,000 mg by mouth in the morning.     fluticasone (FLONASE) 50 MCG/ACT nasal spray Place 1 spray into both nostrils daily as needed for allergies.     folic acid (FOLVITE) 974 MCG tablet Take 800 mcg by mouth in the morning.     lidocaine-prilocaine (EMLA) cream Apply 1 application topically as needed. 30 g 2   metoprolol succinate (TOPROL-XL) 50 MG 24 hr tablet TAKE 1 TABLET BY MOUTH  DAILY 90 tablet 3   mirtazapine (REMERON) 45 MG tablet Take 45 mg by mouth at bedtime.      nitroGLYCERIN (NITROSTAT) 0.4 MG SL tablet DISSOLVE 1 TABLET UNDER THE TONGUE EVERY 5 MINUTES AS  NEEDED FOR CHEST PAIN. MAX  OF 3 TABLETS IN 15 MINUTES. CALL 911 IF PAIN PERSISTS. 25 tablet 3   ondansetron (ZOFRAN) 4 MG tablet Take 4 mg by mouth every 8 (eight) hours as needed for nausea or vomiting.     pantoprazole (PROTONIX) 40 MG  tablet Take 40 mg by mouth in the morning.     potassium chloride SA (KLOR-CON) 20 MEQ tablet Take 1 tablet (20 mEq total) by mouth daily. 7 tablet 0   prochlorperazine (COMPAZINE) 10 MG tablet Take 1 tablet (10 mg total) by mouth every 6 (six) hours as needed for nausea or vomiting. 30 tablet 0   sertraline (ZOLOFT) 100 MG tablet Take 200 mg by mouth in the morning.     sucralfate (CARAFATE) 1 g tablet Take 1 tablet (1 g total) by mouth 4 (four) times daily. Dissolve each tablet in 15 cc water  before use. 120 tablet 2   No current facility-administered medications for this visit.    SURGICAL HISTORY:  Past Surgical History:  Procedure Laterality Date   ABDOMINAL HYSTERECTOMY     APPENDECTOMY     BOWEL RESECTION N/A 08/01/2013   Procedure: SMALL BOWEL RESECTION;  Surgeon: Harl Bowie, MD;  Location: [place];  Service: General;  Laterality: N/A;   BRONCHIAL BIOPSY  07/02/2020   Procedure: BRONCHIAL BIOPSIES;  Surgeon: Collene Gobble, MD;  Location: [facility] ENDOSCOPY;  Service: Pulmonary;;   BRONCHIAL BRUSHINGS  07/02/2020   Procedure: BRONCHIAL BRUSHINGS;  Surgeon: Collene Gobble, MD;  Location: [facility] ENDOSCOPY;  Service: Pulmonary;;   BRONCHIAL NEEDLE ASPIRATION BIOPSY  07/02/2020   Procedure: BRONCHIAL NEEDLE ASPIRATION BIOPSIES;  Surgeon: Collene Gobble, MD;  Location: [place];  Service: Pulmonary;;   BRONCHIAL WASHINGS  07/02/2020   Procedure: BRONCHIAL WASHINGS;  Surgeon: Collene Gobble, MD;  Location: [place] ENDOSCOPY;  Service: Pulmonary;;   cataract surgery Bilateral    CHOLECYSTECTOMY     COLON RESECTION     COLON SURGERY     COLOSTOMY N/A 08/01/2013   Procedure: COLOSTOMY;  Surgeon: Harl Bowie, MD;  Location: [facility];  Service: General;  Laterality: N/A;   COLOSTOMY TAKEDOWN  01/24/2014   dr blackman   COLOSTOMY TAKEDOWN N/A 01/24/2014   Procedure: COLOSTOMY TAKEDOWN;  Surgeon: Coralie Keens, MD;  Location: [place];  Service: General;  Laterality: N/A;   CORONARY  ANGIOPLASTY WITH STENT PLACEMENT  12/20/2012   STENT TO OM         DR COOPER   ENDARTERECTOMY Right 08/08/2014   Procedure: RIGHT CAROTID ENDARTERECTOMY WITH HEMASHIELD PATCH ANGIOPLASTY;  Surgeon: Elam Dutch, MD;  Location: [place];  Service: Vascular;  Laterality: Right;   FIDUCIAL MARKER PLACEMENT  07/02/2020   Procedure: FIDUCIAL MARKER PLACEMENT;  Surgeon: Collene Gobble, MD;  Location: [place] ENDOSCOPY;  Service: Pulmonary;;   FOOT SURGERY Left    HAND SURGERY Bilateral    for ganglion cysts   HEMOSTASIS CONTROL  07/02/2020   Procedure: HEMOSTASIS CONTROL;  Surgeon: Collene Gobble, MD;  Location: [place] ENDOSCOPY;  Service: Pulmonary;;  ice saline and epi   IR IMAGING GUIDED PORT INSERTION  08/23/2020   LAPAROTOMY N/A 08/01/2013   Procedure: EXPLORATORY LAPAROTOMY;  Surgeon: Harl Bowie, MD;  Location: [place];  Service: General;  Laterality: N/A;   LEFT HEART CATH AND CORONARY ANGIOGRAPHY N/A 01/10/2019   Procedure: LEFT HEART CATH AND CORONARY ANGIOGRAPHY;  Surgeon: Troy Sine, MD;  Location: [place] CV LAB;  Service: Cardiovascular;  Laterality: N/A;   LEFT HEART CATHETERIZATION WITH CORONARY ANGIOGRAM N/A 12/20/2012   Procedure: LEFT HEART CATHETERIZATION WITH CORONARY ANGIOGRAM;  Surgeon: Blane Ohara, MD;  Location: [facility] CATH LAB;  Service: Cardiovascular;  Laterality: N/A;   PARTIAL COLECTOMY N/A 08/01/2013   Procedure: PARTIAL COLECTOMY;  Surgeon: Harl Bowie, MD;  Location: [place];  Service: General;  Laterality: N/A;   ROTATOR CUFF REPAIR Left    TONSILLECTOMY     TRACHEOSTOMY     feinstein   VIDEO BRONCHOSCOPY WITH ENDOBRONCHIAL NAVIGATION N/A 07/02/2020   Procedure: VIDEO BRONCHOSCOPY WITH ENDOBRONCHIAL NAVIGATION;  Surgeon: Collene Gobble, MD;  Location: [place] ENDOSCOPY;  Service: Pulmonary;  Laterality: N/A;    REVIEW OF SYSTEMS:   Review of Systems  Constitutional: Positive for fatigue.  Positive for decreased appetite and weight loss.  Negative for chills  and fever. HENT: Positive for  odynophagia.  Negative for mouth sores or nosebleeds.  Eyes: Negative for eye problems and icterus.  Respiratory: Positive for cough and shortness of breath.  Negative for hemoptysis and wheezing.   Cardiovascular: Negative for chest pain and leg swelling.  Gastrointestinal: Positive for mild nausea.  Positive for diarrhea.  Negative for abdominal pain, constipation, and vomiting.  Genitourinary: Negative for bladder incontinence, difficulty urinating, dysuria, frequency and hematuria.   Musculoskeletal: Negative for back pain, gait problem, neck pain and neck stiffness.  Skin: Negative for itching and rash.  Neurological: Negative for dizziness, extremity weakness, gait problem, headaches, light-headedness and seizures.  Hematological: Negative for adenopathy. Does not bruise/bleed easily.  Psychiatric/Behavioral: Negative for confusion, depression and sleep disturbance. The patient is not nervous/anxious.     PHYSICAL EXAMINATION:  Blood pressure (!) 112/58, pulse 97, temperature 99.1 F (37.3 C), temperature source Oral, resp. rate 20, height $RemoveBe'4\' 11"'FimgscUcW$  (1.499 m), weight 143 lb (64.9 kg), SpO2 99 %.  ECOG PERFORMANCE STATUS: 2-3  Physical Exam  Constitutional: Oriented to person, place, and time and chronically ill-appearing female and in no distress.  HENT:  Head: Normocephalic and atraumatic.  Mouth/Throat: Oropharynx is clear and moist. No oropharyngeal exudate.  Eyes: Conjunctivae are normal. Right eye exhibits no discharge. Left eye exhibits no discharge. No scleral icterus.  Neck: Normal range of motion. Neck supple.  Cardiovascular: Normal rate, regular rhythm, normal heart sounds and intact distal pulses.   Pulmonary/Chest: Effort normal and breath sounds normal. No respiratory distress. No wheezes. No rales.  Abdominal: Soft. Bowel sounds are normal. Exhibits no distension and no mass. There is no tenderness.  Musculoskeletal: Normal range of  motion. Exhibits no edema.  Lymphadenopathy:    No cervical adenopathy.  Neurological: Alert and oriented to person, place, and time.  Exhibits muscle weakness.  Examined in the wheelchair.  Skin: Skin is warm and dry. No rash noted. Not diaphoretic. No erythema. No pallor.  Psychiatric: Mood, memory and judgment normal.  Vitals reviewed.  LABORATORY DATA: Lab Results  Component Value Date   WBC 2.7 (L) 09/17/2020   HGB 9.6 (L) 09/17/2020   HCT 29.2 (L) 09/17/2020   MCV 90.7 09/17/2020   PLT 193 09/17/2020      Chemistry      Component Value Date/Time   NA 146 (H) 09/17/2020 1013   NA 139 01/25/2019 1036   K 3.1 (L) 09/17/2020 1013   CL 111 09/17/2020 1013   CO2 25 09/17/2020 1013   BUN 13 09/17/2020 1013   BUN 12 01/25/2019 1036   CREATININE 0.77 09/17/2020 1013      Component Value Date/Time   CALCIUM 8.7 (L) 09/17/2020 1013   ALKPHOS 75 09/17/2020 1013   AST 14 (L) 09/17/2020 1013   ALT 10 09/17/2020 1013   BILITOT 0.6 09/17/2020 1013       RADIOGRAPHIC STUDIES:  IR IMAGING GUIDED PORT INSERTION  Result Date: 08/23/2020 CLINICAL DATA:  Lung cancer, access for chemotherapy EXAM: RIGHT INTERNAL JUGULAR SINGLE LUMEN POWER PORT CATHETER INSERTION Date:  08/23/2020 08/23/2020 3:30 pm Radiologist:  Jerilynn Mages. Daryll Brod, MD Guidance:  Ultrasound and fluoroscopic MEDICATIONS: 1% lidocaine with epinephrine ANESTHESIA/SEDATION: Versed 2.0 mg IV; Fentanyl 100 mcg IV; Moderate Sedation Time:  25 minutes The patient was continuously monitored during the procedure by the interventional radiology nurse under my direct supervision. FLUOROSCOPY TIME:  0 minutes, 42 seconds (5 mGy) COMPLICATIONS: None immediate. CONTRAST:  None. PROCEDURE: Informed consent was obtained from the patient following explanation of the procedure,  risks, benefits and alternatives. The patient understands, agrees and consents for the procedure. All questions were addressed. A time out was performed. Maximal barrier  sterile technique utilized including caps, mask, sterile gowns, sterile gloves, large sterile drape, hand hygiene, and 2% chlorhexidine scrub. Under sterile conditions and local anesthesia, right internal jugular micropuncture venous access was performed. Access was performed with ultrasound. Images were obtained for documentation of the patent right internal jugular vein. A guide wire was inserted followed by a transitional dilator. This allowed insertion of a guide wire and catheter into the IVC. Measurements were obtained from the SVC / RA junction back to the right IJ venotomy site. In the right infraclavicular chest, a subcutaneous pocket was created over the second anterior rib. This was done under sterile conditions and local anesthesia. 1% lidocaine with epinephrine was utilized for this. A 2.5 cm incision was made in the skin. Blunt dissection was performed to create a subcutaneous pocket over the right pectoralis major muscle. The pocket was flushed with saline vigorously. There was adequate hemostasis. The port catheter was assembled and checked for leakage. The port catheter was secured in the pocket with two retention sutures. The tubing was tunneled subcutaneously to the right venotomy site and inserted into the SVC/RA junction through a valved peel-away sheath. Position was confirmed with fluoroscopy. Images were obtained for documentation. The patient tolerated the procedure well. No immediate complications. Incisions were closed in a two layer fashion with 4 - 0 Vicryl suture. Dermabond was applied to the skin. The port catheter was accessed, blood was aspirated followed by saline and heparin flushes. Needle was removed. A dry sterile dressing was applied. IMPRESSION: Ultrasound and fluoroscopically guided right internal jugular single lumen power port catheter insertion. Tip in the SVC/RA junction. Catheter ready for use. Electronically Signed   By: Jerilynn Mages.  Shick M.D.   On: 08/23/2020 15:43      ASSESSMENT/PLAN:  This is a very pleasant 80 year old Caucasian female diagnosed with stage IIIb (T1b, N3, M0) non-small cell lung cancer, squamous cell carcinoma.  She presented with a right lower lobe lung nodule in addition to bilateral mediastinal and right hilar adenopathy.  She was diagnosed in June 2022.  Her PD-L1 expression is 30%.  The patient is currently undergoing treatment with weekly concurrent chemoradiation with carboplatin for an AUC of 2 and paclitaxel 45 mg per metered squared.  Her first dose of treatment was on 07/30/2020.  This patient is status post 5 cycles.  Due to her reaction to paclitaxel, Dr. Julien Nordmann changed her Taxol to Abraxane 40 mg/m2.     Her last day of radiation is scheduled for 09/25/20.    I discussed the patient's symptoms with Dr. Julien Nordmann today.  Today will be her last dose of chemotherapy.  The patient lives in [place] and would like to transfer care to [place] due to transportation constraints.  I will arrange for her restaging CT scan to be performed in 4 weeks and I will place a referral to the [place] cancer [facility].  The patient would like to see Dr. Anabel Bene.    Labs reviewed.  Potassium is low likely secondary to her recent diarrhea.  I have sent a prescription for potassium supplement to the patient's pharmacy and advised her to take.  I also strongly encouraged her to stay hydrated.  Discussed with her the importance of taking the potassium supplements as potassium helps the heart function properly.  Recommend that she proceed with cycle #6 today as scheduled with carboplatin  today and abraxane.  For the diarrhea which reportedly started during her hospitalization, I reviewed with Dr. Julien Nordmann. Advised for the patient to use imodium. It looks like she was tested with a GI pathogen panel 1 month ago which was negative. If no improvement in her diarrhea, she sees her PCP on Thursday and she will discuss with them if she needs further evaluation.   4  view of diet aphasia, the patient is currently using a PPI and Carafate without significant improvement.  She is likely not a good candidate for lidocaine.  We will reach out to radiation oncology to see if they have any other recommendations.  The patient was advised to call immediately if she has any concerning symptoms in the interval. The patient voices understanding of current disease status and treatment options and is in agreement with the current care plan. All questions were answered. The patient knows to call the clinic with any problems, questions or concerns. We can certainly see the patient much sooner if necessary    Orders Placed This Encounter  Procedures   CT Chest W Contrast    Standing Status:   Future    Standing Expiration Date:   09/17/2021    Order Specific Question:   If indicated for the ordered procedure, I authorize the administration of contrast media per Radiology protocol    Answer:   Yes    Order Specific Question:   Preferred imaging location?    Answer:   [facility]   Ambulatory referral to Hematology / Oncology    Referral Priority:   Routine    Referral Type:   Consultation    Referral Reason:   Specialty Services Required    Requested Specialty:   Oncology    Number of Visits Requested:   1     The total time spent in the appointment was 30-39 minutes   Lyn Joens L Chanz Cahall, PA-C 09/17/20

## 2020-09-14 ENCOUNTER — Telehealth: Payer: Self-pay

## 2020-09-14 ENCOUNTER — Ambulatory Visit: Payer: Medicare Other

## 2020-09-14 MED FILL — Dexamethasone Sodium Phosphate Inj 100 MG/10ML: INTRAMUSCULAR | Qty: 2 | Status: AC

## 2020-09-14 NOTE — Telephone Encounter (Signed)
Pt LM wanting to know when she is to resume tx. According to her records, pt has dementia. I attempted to call pts husband but pt answered and declined to allow me to speak with her husband. I let her know her appt is on Monday with an arrival time of 10:15am.

## 2020-09-17 ENCOUNTER — Other Ambulatory Visit: Payer: Medicare Other

## 2020-09-17 ENCOUNTER — Ambulatory Visit: Payer: Medicare Other

## 2020-09-17 ENCOUNTER — Inpatient Hospital Stay: Payer: Medicare Other

## 2020-09-17 ENCOUNTER — Other Ambulatory Visit: Payer: Self-pay

## 2020-09-17 ENCOUNTER — Ambulatory Visit: Payer: Medicare Other | Admitting: Physician Assistant

## 2020-09-17 ENCOUNTER — Encounter: Payer: Self-pay | Admitting: Physician Assistant

## 2020-09-17 ENCOUNTER — Other Ambulatory Visit: Payer: Self-pay | Admitting: Radiation Oncology

## 2020-09-17 ENCOUNTER — Inpatient Hospital Stay (HOSPITAL_BASED_OUTPATIENT_CLINIC_OR_DEPARTMENT_OTHER): Payer: Medicare Other | Admitting: Physician Assistant

## 2020-09-17 ENCOUNTER — Ambulatory Visit
Admission: RE | Admit: 2020-09-17 | Discharge: 2020-09-17 | Disposition: A | Discharge status: Home / Self Care | Payer: Medicare Other | Source: Ambulatory Visit | Attending: Radiation Oncology | Admitting: Radiation Oncology

## 2020-09-17 VITALS — BP 112/58 | HR 97 | Temp 99.1°F | Resp 20 | Ht 59.0 in | Wt 143.0 lb

## 2020-09-17 DIAGNOSIS — E876 Hypokalemia: Secondary | ICD-10-CM | POA: Diagnosis not present

## 2020-09-17 DIAGNOSIS — C3491 Malignant neoplasm of unspecified part of right bronchus or lung: Secondary | ICD-10-CM

## 2020-09-17 DIAGNOSIS — R5383 Other fatigue: Secondary | ICD-10-CM | POA: Diagnosis not present

## 2020-09-17 DIAGNOSIS — Z510 Encounter for antineoplastic radiation therapy: Secondary | ICD-10-CM | POA: Diagnosis not present

## 2020-09-17 DIAGNOSIS — Z5111 Encounter for antineoplastic chemotherapy: Secondary | ICD-10-CM | POA: Diagnosis not present

## 2020-09-17 DIAGNOSIS — K219 Gastro-esophageal reflux disease without esophagitis: Secondary | ICD-10-CM | POA: Diagnosis not present

## 2020-09-17 DIAGNOSIS — R1310 Dysphagia, unspecified: Secondary | ICD-10-CM | POA: Diagnosis not present

## 2020-09-17 DIAGNOSIS — C3431 Malignant neoplasm of lower lobe, right bronchus or lung: Secondary | ICD-10-CM

## 2020-09-17 DIAGNOSIS — Z95828 Presence of other vascular implants and grafts: Secondary | ICD-10-CM

## 2020-09-17 LAB — CMP (CANCER CENTER ONLY)
ALT: 10 U/L (ref 0–44)
AST: 14 U/L — ABNORMAL LOW (ref 15–41)
Albumin: 3.1 g/dL — ABNORMAL LOW (ref 3.5–5.0)
Alkaline Phosphatase: 75 U/L (ref 38–126)
Anion gap: 10 (ref 5–15)
BUN: 13 mg/dL (ref 8–23)
CO2: 25 mmol/L (ref 22–32)
Calcium: 8.7 mg/dL — ABNORMAL LOW (ref 8.9–10.3)
Chloride: 111 mmol/L (ref 98–111)
Creatinine: 0.77 mg/dL (ref 0.44–1.00)
GFR, Estimated: >60 mL/min (ref >60)
Glucose, Bld: 166 mg/dL — ABNORMAL HIGH (ref 70–99)
Potassium: 3.1 mmol/L — ABNORMAL LOW (ref 3.5–5.1)
Sodium: 146 mmol/L — ABNORMAL HIGH (ref 135–145)
Total Bilirubin: 0.6 mg/dL (ref 0.3–1.2)
Total Protein: 6.1 g/dL — ABNORMAL LOW (ref 6.5–8.1)

## 2020-09-17 LAB — CBC WITH DIFFERENTIAL (CANCER CENTER ONLY)
Abs Immature Granulocytes: 0.02 10*3/uL (ref 0.00–0.07)
Basophils Absolute: 0 10*3/uL (ref 0.0–0.1)
Basophils Relative: 0 %
Eosinophils Absolute: 0 10*3/uL (ref 0.0–0.5)
Eosinophils Relative: 1 %
HCT: 29.2 % — ABNORMAL LOW (ref 36.0–46.0)
Hemoglobin: 9.6 g/dL — ABNORMAL LOW (ref 12.0–15.0)
Immature Granulocytes: 1 %
Lymphocytes Relative: 24 %
Lymphs Abs: 0.7 10*3/uL (ref 0.7–4.0)
MCH: 29.8 pg (ref 26.0–34.0)
MCHC: 32.9 g/dL (ref 30.0–36.0)
MCV: 90.7 fL (ref 80.0–100.0)
Monocytes Absolute: 0.3 10*3/uL (ref 0.1–1.0)
Monocytes Relative: 11 %
Neutro Abs: 1.7 10*3/uL (ref 1.7–7.7)
Neutrophils Relative %: 63 %
Platelet Count: 193 10*3/uL (ref 150–400)
RBC: 3.22 MIL/uL — ABNORMAL LOW (ref 3.87–5.11)
RDW: 20.3 % — ABNORMAL HIGH (ref 11.5–15.5)
WBC Count: 2.7 10*3/uL — ABNORMAL LOW (ref 4.0–10.5)
nRBC: 0 % (ref 0.0–0.2)

## 2020-09-17 MED ORDER — SODIUM CHLORIDE 0.9 % IV SOLN
20.0000 mg | Freq: Once | INTRAVENOUS | Status: DC
  Filled 2020-09-17 (×2): qty 2

## 2020-09-17 MED ORDER — SODIUM CHLORIDE 0.9 % IV SOLN
20.0000 mg | Freq: Once | INTRAVENOUS | Status: AC
  Administered 2020-09-17: 20 mg via INTRAVENOUS
  Filled 2020-09-17: qty 2

## 2020-09-17 MED ORDER — SODIUM CHLORIDE 0.9% FLUSH
10.0000 mL | Freq: Once | INTRAVENOUS | Status: AC
  Administered 2020-09-17: 10 mL via INTRAVENOUS

## 2020-09-17 MED ORDER — PACLITAXEL PROTEIN-BOUND CHEMO INJECTION 100 MG
40.0000 mg/m2 | Freq: Once | INTRAVENOUS | Status: AC
  Administered 2020-09-17: 75 mg via INTRAVENOUS
  Filled 2020-09-17: qty 15

## 2020-09-17 MED ORDER — SODIUM CHLORIDE 0.9 % IV SOLN
145.0000 mg | Freq: Once | INTRAVENOUS | Status: AC
  Administered 2020-09-17: 150 mg via INTRAVENOUS
  Filled 2020-09-17: qty 15

## 2020-09-17 MED ORDER — PALONOSETRON HCL INJECTION 0.25 MG/5ML
0.2500 mg | Freq: Once | INTRAVENOUS | Status: AC
  Administered 2020-09-17: 0.25 mg via INTRAVENOUS
  Filled 2020-09-17: qty 5

## 2020-09-17 MED ORDER — POTASSIUM CHLORIDE CRYS ER 20 MEQ PO TBCR: 20.0000 meq | EXTENDED_RELEASE_TABLET | Freq: Every day | ORAL | 0 refills | Status: DC

## 2020-09-17 MED ORDER — HYDROCODONE-ACETAMINOPHEN 7.5-325 MG/15ML PO SOLN: 10.0000 mL | Freq: Four times a day (QID) | ORAL | 0 refills | Status: DC | PRN

## 2020-09-17 MED ORDER — SODIUM CHLORIDE 0.9% FLUSH
10.0000 mL | INTRAVENOUS | Status: DC | PRN
  Administered 2020-09-17: 10 mL

## 2020-09-17 MED ORDER — SODIUM CHLORIDE 0.9 % IV SOLN
Freq: Once | INTRAVENOUS | Status: AC
Start: 2020-09-17 — End: 2020-09-17

## 2020-09-17 MED ORDER — HEPARIN SOD (PORK) LOCK FLUSH 100 UNIT/ML IV SOLN
500.0000 [IU] | Freq: Once | INTRAVENOUS | Status: AC | PRN
  Administered 2020-09-17: 500 [IU]

## 2020-09-17 NOTE — Patient Instructions (Signed)
[facility] ONCOLOGY  Discharge Instructions: Thank you for choosing [facility] to provide your oncology and hematology care.   If you have a lab appointment with the [facility], please go directly to the [facility] and check in at the registration area.   Wear comfortable clothing and clothing appropriate for easy access to any Portacath or PICC line.   We strive to give you quality time with your provider. You may need to reschedule your appointment if you arrive late (15 or more minutes).  Arriving late affects you and other patients whose appointments are after yours.  Also, if you miss three or more appointments without notifying the office, you may be dismissed from the clinic at the provider's discretion.      For prescription refill requests, have your pharmacy contact our office and allow 72 hours for refills to be completed.    Today you received the following chemotherapy and/or immunotherapy agents: Abraxane, Carboplatin   To help prevent nausea and vomiting after your treatment, we encourage you to take your nausea medication as directed.  BELOW ARE SYMPTOMS THAT SHOULD BE REPORTED IMMEDIATELY: *FEVER GREATER THAN 100.4 F (38 C) OR HIGHER *CHILLS OR SWEATING *NAUSEA AND VOMITING THAT IS NOT CONTROLLED WITH YOUR NAUSEA MEDICATION *UNUSUAL SHORTNESS OF BREATH *UNUSUAL BRUISING OR BLEEDING *URINARY PROBLEMS (pain or burning when urinating, or frequent urination) *BOWEL PROBLEMS (unusual diarrhea, constipation, pain near the anus) TENDERNESS IN MOUTH AND THROAT WITH OR WITHOUT PRESENCE OF ULCERS (sore throat, sores in mouth, or a toothache) UNUSUAL RASH, SWELLING OR PAIN  UNUSUAL VAGINAL DISCHARGE OR ITCHING   Items with * indicate a potential emergency and should be followed up as soon as possible or go to the Emergency Department if any problems should occur.  Please show the CHEMOTHERAPY ALERT CARD or IMMUNOTHERAPY ALERT CARD at  check-in to the Emergency Department and triage nurse.  Should you have questions after your visit or need to cancel or reschedule your appointment, please contact [facility]  Dept: [phone]  and follow the prompts.  Office hours are 8:00 a.m. to 4:30 p.m. Monday - Friday. Please note that voicemails left after 4:00 p.m. may not be returned until the following business day.  We are closed weekends and major holidays. You have access to a nurse at all times for urgent questions. Please call the main number to the clinic Dept: [phone] and follow the prompts.   For any non-urgent questions, you may also contact your provider using MyChart. We now offer e-Visits for anyone 20 and older to request care online for non-urgent symptoms. For details visit [email or web address].   Also download the MyChart app! Go to the app store, search "MyChart", open the app, select [facility], and log in with your MyChart username and password.  Due to Covid, a mask is required upon entering the hospital/clinic. If you do not have a mask, one will be given to you upon arrival. For doctor visits, patients may have 1 support person aged 48 or older with them. For treatment visits, patients cannot have anyone with them due to current Covid guidelines and our immunocompromised population.

## 2020-09-17 NOTE — Progress Notes (Signed)
Per Dr Julien Nordmann , continue same dose of Carboplatin and Abraxane.

## 2020-09-17 NOTE — Patient Instructions (Signed)
-  Today will be her last day of chemotherapy.  However, please continue to complete your radiation this week and next week on Tuesday as planned. I will refer you to Dr. Bobby Rumpf or Dr. Anabel Bene at [facility] to resume care.  -I am ordering a CT scan of your chest to be done in about 4 weeks from today which will help Dr. Bobby Rumpf or Dr. Anabel Bene discussed what the next steps are for care when they see you. -Please take Imodium for your diarrhea.  If this does not help you, see your primary care provider to see if you need further testing.  It looks like the check for a stomach bug in the hospital which was negative. -Your potassium is low today.  I sent a prescription to your pharmacy.  Potassium is important for your heart to beat so please make sure that you are taking this as prescribed. -

## 2020-09-18 ENCOUNTER — Ambulatory Visit: Payer: Medicare Other

## 2020-09-18 ENCOUNTER — Ambulatory Visit
Admission: RE | Admit: 2020-09-18 | Discharge: 2020-09-18 | Disposition: A | Discharge status: Home / Self Care | Payer: Medicare Other | Source: Ambulatory Visit | Attending: Radiation Oncology | Admitting: Radiation Oncology

## 2020-09-18 DIAGNOSIS — C3431 Malignant neoplasm of lower lobe, right bronchus or lung: Secondary | ICD-10-CM | POA: Diagnosis not present

## 2020-09-18 DIAGNOSIS — Z510 Encounter for antineoplastic radiation therapy: Secondary | ICD-10-CM | POA: Diagnosis not present

## 2020-09-19 ENCOUNTER — Ambulatory Visit
Admission: RE | Admit: 2020-09-19 | Discharge: 2020-09-19 | Disposition: A | Discharge status: Home / Self Care | Payer: Medicare Other | Source: Ambulatory Visit | Attending: Radiation Oncology | Admitting: Radiation Oncology

## 2020-09-19 ENCOUNTER — Ambulatory Visit: Payer: Medicare Other

## 2020-09-19 ENCOUNTER — Other Ambulatory Visit: Payer: Self-pay

## 2020-09-19 DIAGNOSIS — Z510 Encounter for antineoplastic radiation therapy: Secondary | ICD-10-CM | POA: Diagnosis not present

## 2020-09-19 DIAGNOSIS — C3431 Malignant neoplasm of lower lobe, right bronchus or lung: Secondary | ICD-10-CM | POA: Diagnosis not present

## 2020-09-20 ENCOUNTER — Ambulatory Visit: Payer: Medicare Other

## 2020-09-20 ENCOUNTER — Ambulatory Visit
Admission: RE | Admit: 2020-09-20 | Discharge: 2020-09-20 | Disposition: A | Discharge status: Home / Self Care | Payer: Medicare Other | Source: Ambulatory Visit | Attending: Radiation Oncology | Admitting: Radiation Oncology

## 2020-09-20 ENCOUNTER — Telehealth: Payer: Self-pay | Admitting: Oncology

## 2020-09-20 DIAGNOSIS — D849 Immunodeficiency, unspecified: Secondary | ICD-10-CM | POA: Diagnosis not present

## 2020-09-20 DIAGNOSIS — C3491 Malignant neoplasm of unspecified part of right bronchus or lung: Secondary | ICD-10-CM | POA: Diagnosis not present

## 2020-09-20 DIAGNOSIS — C3431 Malignant neoplasm of lower lobe, right bronchus or lung: Secondary | ICD-10-CM | POA: Insufficient documentation

## 2020-09-20 DIAGNOSIS — I471 Supraventricular tachycardia: Secondary | ICD-10-CM | POA: Diagnosis not present

## 2020-09-20 DIAGNOSIS — Z510 Encounter for antineoplastic radiation therapy: Secondary | ICD-10-CM | POA: Diagnosis not present

## 2020-09-20 DIAGNOSIS — M79641 Pain in right hand: Secondary | ICD-10-CM | POA: Diagnosis not present

## 2020-09-20 DIAGNOSIS — Z79899 Other long term (current) drug therapy: Secondary | ICD-10-CM | POA: Diagnosis not present

## 2020-09-20 DIAGNOSIS — I10 Essential (primary) hypertension: Secondary | ICD-10-CM | POA: Diagnosis not present

## 2020-09-20 DIAGNOSIS — U071 COVID-19: Secondary | ICD-10-CM | POA: Diagnosis not present

## 2020-09-20 DIAGNOSIS — D539 Nutritional anemia, unspecified: Secondary | ICD-10-CM | POA: Diagnosis not present

## 2020-09-20 NOTE — Telephone Encounter (Signed)
Patient referred by Dr Curt Bears for Lung CA.  Appt made for 09/25/20 Labs 11:30 am - Consult 12:00 pm

## 2020-09-21 ENCOUNTER — Ambulatory Visit: Payer: Medicare Other

## 2020-09-21 ENCOUNTER — Other Ambulatory Visit: Payer: Self-pay

## 2020-09-21 ENCOUNTER — Ambulatory Visit
Admission: RE | Admit: 2020-09-21 | Discharge: 2020-09-21 | Disposition: A | Discharge status: Home / Self Care | Payer: Medicare Other | Source: Ambulatory Visit | Attending: Radiation Oncology | Admitting: Radiation Oncology

## 2020-09-21 DIAGNOSIS — C3431 Malignant neoplasm of lower lobe, right bronchus or lung: Secondary | ICD-10-CM | POA: Diagnosis not present

## 2020-09-21 DIAGNOSIS — Z510 Encounter for antineoplastic radiation therapy: Secondary | ICD-10-CM | POA: Diagnosis not present

## 2020-09-24 ENCOUNTER — Other Ambulatory Visit: Payer: Self-pay | Admitting: Oncology

## 2020-09-24 ENCOUNTER — Ambulatory Visit: Payer: Medicare Other

## 2020-09-24 DIAGNOSIS — C3431 Malignant neoplasm of lower lobe, right bronchus or lung: Secondary | ICD-10-CM

## 2020-09-25 ENCOUNTER — Inpatient Hospital Stay: Payer: Medicare Other | Attending: Internal Medicine

## 2020-09-25 ENCOUNTER — Encounter: Payer: Self-pay | Admitting: Radiation Oncology

## 2020-09-25 ENCOUNTER — Encounter: Payer: Self-pay | Admitting: Oncology

## 2020-09-25 ENCOUNTER — Inpatient Hospital Stay (INDEPENDENT_AMBULATORY_CARE_PROVIDER_SITE_OTHER): Payer: Medicare Other | Admitting: Oncology

## 2020-09-25 ENCOUNTER — Other Ambulatory Visit: Payer: Self-pay

## 2020-09-25 ENCOUNTER — Ambulatory Visit
Admission: RE | Admit: 2020-09-25 | Discharge: 2020-09-25 | Disposition: A | Discharge status: Home / Self Care | Payer: Medicare Other | Source: Ambulatory Visit | Attending: Radiation Oncology | Admitting: Radiation Oncology

## 2020-09-25 VITALS — BP 131/54 | HR 89 | Temp 98.6°F | Resp 20 | Ht 59.0 in | Wt 142.4 lb

## 2020-09-25 DIAGNOSIS — C3431 Malignant neoplasm of lower lobe, right bronchus or lung: Secondary | ICD-10-CM | POA: Diagnosis not present

## 2020-09-25 DIAGNOSIS — R5383 Other fatigue: Secondary | ICD-10-CM | POA: Diagnosis not present

## 2020-09-25 DIAGNOSIS — Z510 Encounter for antineoplastic radiation therapy: Secondary | ICD-10-CM | POA: Diagnosis not present

## 2020-09-25 DIAGNOSIS — K219 Gastro-esophageal reflux disease without esophagitis: Secondary | ICD-10-CM | POA: Diagnosis not present

## 2020-09-25 DIAGNOSIS — R1310 Dysphagia, unspecified: Secondary | ICD-10-CM | POA: Insufficient documentation

## 2020-09-25 DIAGNOSIS — D649 Anemia, unspecified: Secondary | ICD-10-CM | POA: Diagnosis not present

## 2020-09-25 DIAGNOSIS — E876 Hypokalemia: Secondary | ICD-10-CM | POA: Diagnosis not present

## 2020-09-25 DIAGNOSIS — M8580 Other specified disorders of bone density and structure, unspecified site: Secondary | ICD-10-CM

## 2020-09-25 DIAGNOSIS — Z780 Asymptomatic menopausal state: Secondary | ICD-10-CM

## 2020-09-25 LAB — CBC AND DIFFERENTIAL
HCT: 29 — AB (ref 36–46)
Hemoglobin: 9.6 — AB (ref 12.0–16.0)
Neutrophils Absolute: 2.66
Platelets: 187 (ref 150–399)
WBC: 3.5

## 2020-09-25 LAB — COMPREHENSIVE METABOLIC PANEL
Albumin: 3.6 (ref 3.5–5.0)
Calcium: 8.8 (ref 8.7–10.7)

## 2020-09-25 LAB — CBC: RBC: 3.28 — AB (ref 3.87–5.11)

## 2020-09-25 LAB — BASIC METABOLIC PANEL
BUN: 18 (ref 4–21)
CO2: 25 — AB (ref 13–22)
Chloride: 108 (ref 99–108)
Creatinine: 0.8 (ref 0.5–1.1)
Glucose: 182
Potassium: 3 — AB (ref 3.4–5.3)
Sodium: 143 (ref 137–147)

## 2020-09-25 LAB — HEPATIC FUNCTION PANEL
ALT: 15 (ref 7–35)
AST: 24 (ref 13–35)
Alkaline Phosphatase: 69 (ref 25–125)
Bilirubin, Total: 0.6

## 2020-09-25 NOTE — Progress Notes (Signed)
[facility]  [address] [place],  [place]  [id number] [phone]  Clinic Day:  09/25/2020  Referring physician: Heilingoetter, Cassandr*  This document serves as a record of services personally performed by Hosie Poisson, MD. It was created on their behalf by Curry,Lauren E, a trained medical scribe. The creation of this record is based on the scribe's personal observations and the provider's statements to them.  CHIEF COMPLAINT:  CC: Stage IIIB (T1b N3 M0) squamous cell lung cancer  Current Treatment:  Concurrent chemoradiation with carboplatin/paclitaxel   HISTORY OF PRESENT ILLNESS:  [name] is a 80 y.o. female referred by Dr. Fanny Bien. Mohamed for a transfer of care for continued maintenance and treatment of stage IIIB (T1b, N3, M0) squamous cell lung cancer diagnosed in June 2022.  This began when the patient started to experience intermittent chest pain and shortness of breath, and followed up with cardiology for further evaluation.  Chest x-ray was abnormal.  Due to her history of COPD and long history of smoking she was referred to pulmonology.  CT chest from May revealed a new highly suspicious spiculated pulmonary nodule within the superior lateral aspect of the right lower lobe measuring 1.5 x 1.0 x 1.4 cm with central small cavitation highly suspicious to be a neoplastic nodule.  There was also nodularity along the upper wall of the left mainstem bronchus of uncertain etiology but also suspicious for additional neoplastic process.  CT super D of the chest from June revealed enlargement of the thick-walled spiculated cavitary nodule in the superior segment of the right lower lobe measuring 1.6 x 1.6.  There was also numerous mediastinal lymph nodes some of which are enlarged including high AP window lymph node measuring 1.2 cm, lower right paratracheal lymph node measuring 1.4 cm.  Video bronchoscopy with electromagnetic navigation  was pursued on June 13th by Dr. Lamonte Sakai and final pathology confirmed malignant cells consistent with non-small cell carcinoma. Immunohistochemistry, was positive for cytokeratin 5/6, cytokeratin AE1/3, p40 and TTF-1 (weak).  Overall, the morphology and immunophenotype favor a squamous cell carcinoma.  Staging PET scan showed the hypermetabolic right lower lobe lung lesion consistent with primary lung neoplasm.  There was also mediastinal and hilar lymphadenopathy but no findings to suggest abdominal/pelvic metastatic disease or osseous metastatic disease for a T1b N3 M0, stage IIIB.  MRI brain was negative for metastasis.  She was referred to Dr. Julien Nordmann, oncology, and was started concurrent chemoradiation with weekly carboplatin/paclitaxel.  Her 1st cycle was July 11th.  With the 3rd cycle, she developed a reaction to paclitaxel and so was switched to Abraxane.  She has received 6 cycles of therapy.   She has a medical history significant for  coronary artery disease status post stent placement, carotid artery disease, colon obstruction status post surgical intervention, depression, COPD, DVT, GERD, intracranial hemorrhage, hypertension, stroke, anemia and Barrett's esophagus.  INTERVAL HISTORY:  [name] states that she has quit smoking since her diagnosis in June after 60 pack years.  She contracted COVID back in August and was quite ill and required hospitalization for 5 days.  Since that time, she has had more fatigue.  White count is 3.5, up from 2.7 1 week ago, ANC of 2660 up from 17000, hemoglobin is stable at 9.6, and platelets are normal.  Chemistries are unremarkable except for a potassium of 3.0, even lower than last week.  She was prescribed oral supplement 20 meq daily, but she was not informed and so  has not picked it up.  She will start this and I also gave her a list of foods to include in her diet.  Her  appetite is good, and she has been eating well.  She denies any significant weight loss or gain.   She denies fever, chills or other signs of infection.  She denies nausea, vomiting, bowel issues, or abdominal pain.  She denies sore throat, cough, dyspnea, or chest pain.  She is up to date on annual mammogram and bone density which revealed osteopenia as of April.  REVIEW OF SYSTEMS:  Review of Systems  Constitutional:  Positive for fatigue. Negative for appetite change, chills, fever and unexpected weight change.  HENT:  Negative.    Eyes: Negative.   Respiratory:  Negative for chest tightness, cough, hemoptysis, shortness of breath and wheezing.   Cardiovascular: Negative.  Negative for chest pain, leg swelling and palpitations.  Gastrointestinal: Negative.  Negative for abdominal distention, abdominal pain, blood in stool, constipation, diarrhea, nausea and vomiting.  Endocrine: Negative.   Genitourinary: Negative.  Negative for difficulty urinating, dysuria, frequency and hematuria.   Musculoskeletal: Negative.  Negative for arthralgias, back pain, flank pain, gait problem and myalgias.  Skin: Negative.   Neurological: Negative.  Negative for dizziness, extremity weakness, gait problem, headaches, light-headedness, numbness, seizures and speech difficulty.  Hematological: Negative.   Psychiatric/Behavioral:  Positive for depression. Negative for sleep disturbance. The patient is nervous/anxious.     VITALS:  Blood pressure (!) 131/54, pulse 89, temperature 98.6 F (37 C), temperature source Oral, resp. rate 20, height 4\' 11"  (1.499 m), weight 142 lb 6.4 oz (64.6 kg), SpO2 97 %.  Wt Readings from Last 3 Encounters:  09/25/20 142 lb 6.4 oz (64.6 kg)  09/17/20 143 lb (64.9 kg)  09/03/20 150 lb (68 kg)    Body mass index is 28.76 kg/m.  Performance status (ECOG): 1 - Symptomatic but completely ambulatory  PHYSICAL EXAM:  Physical Exam Constitutional:      General: She is not in acute distress.    Appearance: Normal appearance. She is normal weight.  HENT:     Head:  Normocephalic and atraumatic.  Eyes:     General: No scleral icterus.    Extraocular Movements: Extraocular movements intact.     Conjunctiva/sclera: Conjunctivae normal.     Pupils: Pupils are equal, round, and reactive to light.  Cardiovascular:     Rate and Rhythm: Normal rate and regular rhythm.     Pulses: Normal pulses.     Heart sounds: Normal heart sounds. No murmur heard.   No friction rub. No gallop.  Pulmonary:     Effort: Pulmonary effort is normal. No respiratory distress.     Breath sounds: Normal breath sounds.  Chest:     Comments: Cyst at 6 o'clock in the right breast.  Both breasts are without masses. Abdominal:     General: Bowel sounds are normal. There is no distension.     Palpations: Abdomen is soft. There is no hepatomegaly, splenomegaly or mass.     Tenderness: There is no abdominal tenderness.     Comments: She has a large midline abdominal scar which is well healed  Musculoskeletal:        General: Normal range of motion.     Cervical back: Normal range of motion and neck supple.     Right lower leg: No edema.     Left lower leg: No edema.  Lymphadenopathy:     Cervical: No cervical  adenopathy.  Skin:    General: Skin is warm and dry.     Findings: Bruising (occasional) present.  Neurological:     General: No focal deficit present.     Mental Status: She is alert and oriented to person, place, and time. Mental status is at baseline.  Psychiatric:        Mood and Affect: Mood normal.        Behavior: Behavior normal.        Thought Content: Thought content normal.        Judgment: Judgment normal.    LABS:   CBC Latest Ref Rng & Units 09/25/2020 09/17/2020 09/03/2020  WBC - 3.5 2.7(L) 2.3(L)  Hemoglobin 12.0 - 16.0 9.6(A) 9.6(L) 8.6(L)  Hematocrit 36 - 46 29(A) 29.2(L) 25.9(L)  Platelets 150 - 399 187 193 86(L)   CMP Latest Ref Rng & Units 09/25/2020 09/17/2020 09/03/2020  Glucose 70 - 99 mg/dL - 166(H) 114(H)  BUN 4 - 21 18 13 17   Creatinine 0.5  - 1.1 0.8 0.77 0.96  Sodium 137 - 147 143 146(H) 146(H)  Potassium 3.4 - 5.3 3.0(A) 3.1(L) 3.7  Chloride 99 - 108 108 111 113(H)  CO2 13 - 22 25(A) 25 23  Calcium 8.7 - 10.7 8.8 8.7(L) 8.6(L)  Total Protein 6.5 - 8.1 g/dL - 6.1(L) 6.0(L)  Total Bilirubin 0.3 - 1.2 mg/dL - 0.6 0.8  Alkaline Phos 25 - 125 69 75 79  AST 13 - 35 24 14(L) 12(L)  ALT 7 - 35 15 10 9      No results found for: CEA1 / No results found for: CEA1  Lab Results  Component Value Date   IRONPCTSAT 7.7 (L) 06/19/2020   Lab Results  Component Value Date   LDH 166 06/19/2020    STUDIES:  No results found.  EXAM: 05/23/2020 CT CHEST WITHOUT CONTRAST   TECHNIQUE: Multidetector CT imaging of the chest was performed following the standard protocol without IV contrast.   COMPARISON:  Chest CT dated 01/08/2019   FINDINGS: Cardiovascular: Aortic atherosclerosis. No thoracic aortic aneurysm. Three-vessel coronary artery calcifications.   Mediastinum/Nodes: Scattered small lymph nodes, stable, none of which are pathologic by CT size criteria. Esophagus is unremarkable. Small hiatal hernia.   Trachea is unremarkable. However, there is nodularity along the upper wall of the LEFT mainstem bronchus, of uncertain etiology (series 3, images 59 through 63).   Lungs/Pleura: New spiculated pulmonary nodule within the superior-lateral aspect of the RIGHT lower lobe, measuring 1.5 x 1 x 1.4 cm, with central small cavitation, almost certainly a neoplastic nodule (axial series 3, image 80; coronal series 6, image 26).   No additional suspicious-appearing pulmonary nodule or mass.   Chronic pleuroparenchymal scarring/fibrosis at the lung apices and lung bases. No pleural effusion.   Emphysematous changes bilaterally, moderate in degree, upper lobe predominant.   Upper Abdomen: Limited images of the upper abdomen are unremarkable.   Musculoskeletal: Degenerative spondylosis of the kyphotic thoracic spine. No  acute or suspicious osseous finding.   IMPRESSION: 1. New highly suspicious spiculated pulmonary nodule within the superior-lateral aspect of the RIGHT lower lobe, measuring 1.5 x 1 x 1.4 cm, with central small cavitation, almost certainly a neoplastic nodule. Recommend tissue sampling for further characterization. 2. Nodularity along the upper wall of the LEFT mainstem bronchus, of uncertain etiology, but suspicious for additional neoplastic process. Consider further characterization with bronchoscopy and/or PET-CT. 3. No additional suspicious-appearing pulmonary nodule or mass. 4. Small hiatal hernia. 5. Three-vessel coronary  artery calcifications.   Aortic Atherosclerosis (ICD10-I70.0) and Emphysema (ICD10-J43.9).  EXAM: 06/27/2020 CT CHEST WITHOUT CONTRAST   TECHNIQUE: Multidetector CT imaging of the chest was performed using thin slice collimation for electromagnetic bronchoscopy planning purposes, without intravenous contrast.   COMPARISON:  05/23/2020 and 10/21/2013.   FINDINGS: Cardiovascular: Atherosclerotic calcification of the aorta, aortic valve and coronary arteries. Pulmonic trunk and heart are enlarged. No pericardial effusion.   Mediastinum/Nodes: Numerous mediastinal lymph nodes, some of which are enlarged. High AP window lymph node measures 12 mm, similar. Low right paratracheal lymph node measures 14 mm, also similar when remeasured. Hilar regions are difficult to definitively evaluate without IV contrast. No axillary adenopathy. Esophagus is grossly unremarkable. Small hiatal hernia.   Lungs/Pleura: Biapical pleuroparenchymal scarring. Centrilobular emphysema. Thick-walled spiculated cavitary nodule in the superior segment right lower lobe measures 1.6 x 1.6 cm (3/80), enlarged from 1.0 x 1.4 cm on 05/23/2020. Scattered subsegmental atelectasis and/or scarring in the lower lobes. Calcified granulomas. Trace left pleural fluid. Nodular soft tissue in the  distal left mainstem bronchus is unchanged.   Upper Abdomen: Visualized portions of the liver, adrenal glands, kidneys, spleen, pancreas, stomach and bowel are unremarkable with the exception of a small hiatal hernia. Cholecystectomy. No upper abdominal adenopathy. Small midline ventral hernia contains fat.   Musculoskeletal: Degenerative changes in the spine. No worrisome lytic or sclerotic lesions.   IMPRESSION: 1. Enlarging thick-walled spiculated right lower lobe nodule, worrisome for primary bronchogenic carcinoma. An infectious etiology is not excluded. 2. Bilateral mediastinal adenopathy, possibly metastatic. 3. Nodular soft tissue in the left mainstem bronchus, as on 05/23/2020, worrisome for malignancy. This would be amenable to direct visualization via bronchoscopy, as clinically indicated. 4. Trace left pleural fluid. 5. Aortic atherosclerosis (ICD10-I70.0). Coronary artery calcification. 6. Enlarged pulmonic trunk, indicative of pulmonary arterial hypertension. 7.  Emphysema (ICD10-J43.9).  CYTOLOGY - NON PAP 07/02/2020 CASE: [id number]  PATIENT: [facility]  Non-Gynecological Cytology Report   Clinical History: None provided   FINAL MICROSCOPIC DIAGNOSIS:   D. LUNG, RLL, BRUSHING:   - Suspicious for malignancy   E. LUNG, RLL, FINE NEEDLE ASPIRATION:  - Malignant cells consistent with non-small cell carcinoma  - See comment   CYTOLOGY - NON PAP  CASE: [id number]  PATIENT: [facility]  Non-Gynecological Cytology Report   Clinical History: None provided   FINAL MICROSCOPIC DIAGNOSIS:   A. LUNG, LEFT MAINSTEM BRONCHUS, FINE NEEDLE ASPIRATION:  - No malignant cells identified   B. LUNG, LEFT MAINSTEM BRONCHUS, BRUSHING:  - Benign reactive/reparative changes   C. LUNG, LEFT MAINSTEM BRONCHUS BIOPSY:  - No malignant cells identified   CYTOLOGY - NON PAP  CASE: [id number]  PATIENT: [facility]  Non-Gynecological Cytology  Report   Clinical History: None provided  Specimen Submitted:  F. LUNG, RLL, LAVAGE:   FINAL MICROSCOPIC DIAGNOSIS:  - Benign reactive/reparative changes   EXAM: 07/05/2020 NUCLEAR MEDICINE PET SKULL BASE TO THIGH   TECHNIQUE: 7.2 mCi F-18 FDG was injected intravenously. Full-ring PET imaging was performed from the skull base to thigh after the radiotracer. CT data was obtained and used for attenuation correction and anatomic localization.   Fasting blood glucose: 116 mg/dl   COMPARISON:  Chest CT 06/27/2020   FINDINGS: Mediastinal blood pool activity: SUV max 2.03   Liver activity: SUV max NA   NECK: No hypermetabolic lymph nodes in the neck.   Incidental CT findings: Significant muscular uptake is noted in the neck and upper chest area which could be due to the patient  taking insulin, eating recently or possibly related to exercise.   CHEST: The 2.4 cm right lower lobe partially cavitary mass is hypermetabolic with SUV max of 5.99. There are also ipsilateral contralateral hypermetabolic mediastinal nodes.   The subcarinal node has an SUV max 4.76.   Right hilar node has an SUV max of 3.35.   Pretracheal has an SUV max of 5.22.   Prevascular node has an SUV max of 5.47.   No other hypermetabolic pulmonary lesions.   Incidental CT findings: Stable cardiac enlargement and aortic and coronary artery calcifications. Stable significant underlying lung disease/emphysema.   ABDOMEN/PELVIS: No PET-CT findings suspicious for hepatic or adrenal gland metastasis. No abdominal or pelvic hypermetabolic lymphadenopathy.   Incidental CT findings: Advanced aortic and branch vessel calcifications but no aneurysm. The gallbladder is surgically absent. Anterior abdominal wall hernia contains small bowel and colon but no obstructive findings.   Cystic area in the left inguinal area could be postoperative change. No hypermetabolism.   SKELETON: Significant diffuse muscular  uptake but no worrisome muscle lesions. No findings suspicious for osseous metastatic disease.   Incidental CT findings: none   IMPRESSION: 1. Hypermetabolic right lower lobe lung lesion consistent with primary lung neoplasm. 2. Mediastinal and hilar lymphadenopathy as detailed above. 3. No findings to suggest abdominal/pelvic metastatic disease or osseous metastatic disease.  EXAM: 07/18/2020 MRI HEAD WITHOUT AND WITH CONTRAST   TECHNIQUE: Multiplanar, multiecho pulse sequences of the brain and surrounding structures were obtained without and with intravenous contrast.   CONTRAST:  88mL GADAVIST GADOBUTROL 1 MMOL/ML IV SOLN   COMPARISON:  CT head 01/23/2015. MRI 08/09/2014.   FINDINGS: Brain: No acute infarction, hemorrhage, hydrocephalus, extra-axial collection or mass lesion. Confluent remote high right frontoparietal infarct with encephalomalacia and surrounding gliosis. Small remote lacunar infarct in the right basal ganglia. Additional smaller remote cortical infarcts in the right frontal lobe. Small nonenhancing focus of susceptibility artifact in the left frontal lobe appears similar to prior and most likely represents the sequela of chronic microhemorrhage. Additional scattered mild T2/FLAIR hyperintensities within the white matter and pons, most likely related to chronic microvascular ischemic disease. No abnormal enhancement to suggest intraparenchymal metastatic disease.   Vascular: Major arterial flow voids are maintained skull base. Small left cerebellar developmental venous anomaly.   Skull and upper cervical spine: Normal marrow signal.   Sinuses/Orbits: Inferior left maxillary sinus mucosal thickening with possible retention cyst. Mild ethmoid air cell mucosal thickening. Unremarkable orbits.   Other: No sizable mastoid effusions.   IMPRESSION: 1. No evidence of acute abnormality or metastatic disease. 2. Confluent remote high right frontoparietal  infarct, smaller remote cortical infarcts in the right frontal lobe and small lacunar infarct in the right basal ganglia. HISTORY:   Past Medical History:  Diagnosis Date   Acute respiratory failure with hypoxia s/p tracheostomy 07/20/2013   Anemia    a. 7-08/2013 felt due to recent critical illness/chronic disease.   Arthritis    handds, knees & back    Barrett's esophagus    CAD (coronary artery disease)    a. Mod dz 2010 initially mgd medically. b. 12/2012 - angina s/p PTCA/DES to mid-circumflex, PTCA/DES to first OM.    Carotid artery disease ([place])    a. 60-70% bilat ICA stenosis by dopplers 05/2012.   Chronic back pain    deteriorating;DDD   Colonic obstruction due to suspected colitis; s/p colectomy/colostomy    a. See SBO.   Complication of anesthesia    hard to wake up from  anesthesia    COPD (chronic obstructive pulmonary disease) (HCC)    Depression    takes Remeron and Zoloft daily   Dizziness    was on Bentyl which caused this-took off of it and no problems since   DVT of axillary vein, acute right ([place])    a. Dx 07/2013 felt due to R IJ central line that had been inserted 7/14. Not on anticoag due to GIB issues and small cerebral bleed.   GERD (gastroesophageal reflux disease)    Heart murmur    History of bronchitis    > 79yrs ago    History of colon polyps    History of hiatal hernia    History of kidney stones    was told it was stable but doesn't know for sure that she ever passed it   HTN (hypertension)    Hypertension    takes Metoprolol daily   Intracranial hemorrhage ([place])    a. 08/11/13 - per DC summary, tiny SAH vs SDH done for altered mentation, anticoag stopped including aspirin.   Joint pain    Neck pain    DDD   Other and unspecified hyperlipidemia    takes Lipitor daily   Pneumonia    hx of->15 yrs ago   Protein-calorie malnutrition, severe w/ electrolyte imbalance    a. Severe hypoalbuminemia leading to 3rd spacing including anasarca and  pulm edema 07/2013.   SBO (small bowel obstruction) ([place])    a. Lysis of adhesions and ovarian cystectomy 04/2013 for sBO. b. Admitted 07/2013 for colonic obstruction due to suspected colitis, s/p partial colectomy/colostomy 08/01/13. Admission complicated by anasarca, acute resp failure requiring tracheostomy, decannulated 08/25/13. c. Recurrent SBO8/2015, NGT placed for decompression.    Stroke [facility]) early 80's   right sided weakness--TIA    Past Surgical History:  Procedure Laterality Date   ABDOMINAL HYSTERECTOMY     APPENDECTOMY     BOWEL RESECTION N/A 08/01/2013   Procedure: SMALL BOWEL RESECTION;  Surgeon: Harl Bowie, MD;  Location: [facility];  Service: General;  Laterality: N/A;   BRONCHIAL BIOPSY  07/02/2020   Procedure: BRONCHIAL BIOPSIES;  Surgeon: Collene Gobble, MD;  Location: [facility] ENDOSCOPY;  Service: Pulmonary;;   BRONCHIAL BRUSHINGS  07/02/2020   Procedure: BRONCHIAL BRUSHINGS;  Surgeon: Collene Gobble, MD;  Location: [facility];  Service: Pulmonary;;   BRONCHIAL NEEDLE ASPIRATION BIOPSY  07/02/2020   Procedure: BRONCHIAL NEEDLE ASPIRATION BIOPSIES;  Surgeon: Collene Gobble, MD;  Location: [facility] ENDOSCOPY;  Service: Pulmonary;;   BRONCHIAL WASHINGS  07/02/2020   Procedure: BRONCHIAL WASHINGS;  Surgeon: Collene Gobble, MD;  Location: [facility] ENDOSCOPY;  Service: Pulmonary;;   cataract surgery Bilateral    CHOLECYSTECTOMY     COLON RESECTION     COLON SURGERY     COLOSTOMY N/A 08/01/2013   Procedure: COLOSTOMY;  Surgeon: Harl Bowie, MD;  Location: [facility];  Service: General;  Laterality: N/A;   COLOSTOMY TAKEDOWN  01/24/2014   dr blackman   COLOSTOMY TAKEDOWN N/A 01/24/2014   Procedure: COLOSTOMY TAKEDOWN;  Surgeon: Coralie Keens, MD;  Location: [facility];  Service: General;  Laterality: N/A;   CORONARY ANGIOPLASTY WITH STENT PLACEMENT  12/20/2012   STENT TO OM         DR COOPER   ENDARTERECTOMY Right 08/08/2014   Procedure: RIGHT CAROTID ENDARTERECTOMY WITH HEMASHIELD PATCH  ANGIOPLASTY;  Surgeon: Elam Dutch, MD;  Location: [place];  Service: Vascular;  Laterality: Right;   FIDUCIAL MARKER PLACEMENT  07/02/2020   Procedure: FIDUCIAL MARKER PLACEMENT;  Surgeon: Collene Gobble, MD;  Location: [facility] ENDOSCOPY;  Service: Pulmonary;;   FOOT SURGERY Left    HAND SURGERY Bilateral    for ganglion cysts   HEMOSTASIS CONTROL  07/02/2020   Procedure: HEMOSTASIS CONTROL;  Surgeon: Collene Gobble, MD;  Location: [facility] ENDOSCOPY;  Service: Pulmonary;;  ice saline and epi   IR IMAGING GUIDED PORT INSERTION  08/23/2020   LAPAROTOMY N/A 08/01/2013   Procedure: EXPLORATORY LAPAROTOMY;  Surgeon: Harl Bowie, MD;  Location: [place];  Service: General;  Laterality: N/A;   LEFT HEART CATH AND CORONARY ANGIOGRAPHY N/A 01/10/2019   Procedure: LEFT HEART CATH AND CORONARY ANGIOGRAPHY;  Surgeon: Troy Sine, MD;  Location: [place] CV LAB;  Service: Cardiovascular;  Laterality: N/A;   LEFT HEART CATHETERIZATION WITH CORONARY ANGIOGRAM N/A 12/20/2012   Procedure: LEFT HEART CATHETERIZATION WITH CORONARY ANGIOGRAM;  Surgeon: Blane Ohara, MD;  Location: [facility] CATH LAB;  Service: Cardiovascular;  Laterality: N/A;   PARTIAL COLECTOMY N/A 08/01/2013   Procedure: PARTIAL COLECTOMY;  Surgeon: Harl Bowie, MD;  Location: [facility];  Service: General;  Laterality: N/A;   ROTATOR CUFF REPAIR Left    TONSILLECTOMY     TRACHEOSTOMY     feinstein   VIDEO BRONCHOSCOPY WITH ENDOBRONCHIAL NAVIGATION N/A 07/02/2020   Procedure: VIDEO BRONCHOSCOPY WITH ENDOBRONCHIAL NAVIGATION;  Surgeon: Collene Gobble, MD;  Location: [place] ENDOSCOPY;  Service: Pulmonary;  Laterality: N/A;    Family History  Problem Relation Age of Onset   Varicose Veins Mother    Heart disease Father    Heart attack Father    AAA (abdominal aortic aneurysm) Father    Heart disease Other     Social History:  reports that she quit smoking about 3 months ago. Her smoking use included cigarettes. She has a 60.00 pack-year  smoking history. She has never used smokeless tobacco. She reports that she does not drink alcohol and does not use drugs.The patient is accompanied by her husband Clint today.  She is married and lives at home with her spouse.  She has 4 children, but her son passed away.  She is retired, and has never been exposed to chemicals or other toxic agents.  Allergies:  Allergies  Allergen Reactions   Pregabalin Swelling    Tongue swelling   Paclitaxel    Sulfonamide Derivatives Swelling    Childhood reaction    Current Medications: Current Outpatient Medications  Medication Sig Dispense Refill   aspirin EC 81 MG tablet Take 2 tablets (162 mg total) by mouth in the morning. Okay to restart this medication on 07/04/2020 30 tablet 11   atorvastatin (LIPITOR) 80 MG tablet Take 80 mg by mouth every evening.      buPROPion (WELLBUTRIN XL) 150 MG 24 hr tablet Take 1 tablet (150 mg total) by mouth daily. 90 tablet 3   CALCIUM PO Take 1 tablet by mouth in the morning.     Cholecalciferol (VITAMIN D3 PO) Take 1 tablet by mouth in the morning.     fish oil-omega-3 fatty acids 1000 MG capsule Take 1,000 mg by mouth in the morning.     fluticasone (FLONASE) 50 MCG/ACT nasal spray Place 1 spray into both nostrils daily as needed for allergies.     folic acid (FOLVITE) 263 MCG tablet Take 800 mcg by mouth in the morning.     HYDROcodone-acetaminophen (HYCET) 7.5-325 mg/15 ml solution Take 10 mLs by mouth 4 (  four) times daily as needed for moderate pain. 120 mL 0   lidocaine-prilocaine (EMLA) cream Apply 1 application topically as needed. 30 g 2   metoprolol succinate (TOPROL-XL) 50 MG 24 hr tablet TAKE 1 TABLET BY MOUTH  DAILY 90 tablet 3   mirtazapine (REMERON) 45 MG tablet Take 45 mg by mouth at bedtime.      nitroGLYCERIN (NITROSTAT) 0.4 MG SL tablet DISSOLVE 1 TABLET UNDER THE TONGUE EVERY 5 MINUTES AS  NEEDED FOR CHEST PAIN. MAX  OF 3 TABLETS IN 15 MINUTES. CALL 911 IF PAIN PERSISTS. 25 tablet 3    ondansetron (ZOFRAN) 4 MG tablet Take 4 mg by mouth every 8 (eight) hours as needed for nausea or vomiting.     pantoprazole (PROTONIX) 40 MG tablet Take 40 mg by mouth in the morning.     potassium chloride SA (KLOR-CON) 20 MEQ tablet Take 1 tablet (20 mEq total) by mouth daily. 7 tablet 0   prochlorperazine (COMPAZINE) 10 MG tablet Take 1 tablet (10 mg total) by mouth every 6 (six) hours as needed for nausea or vomiting. 30 tablet 0   sertraline (ZOLOFT) 100 MG tablet Take 200 mg by mouth in the morning.     sucralfate (CARAFATE) 1 g tablet Take 1 tablet (1 g total) by mouth 4 (four) times daily. Dissolve each tablet in 15 cc water before use. 120 tablet 2   No current facility-administered medications for this visit.     ASSESSMENT & PLAN:   Assessment:    Stage IIIB (T1b N3 M0) squamous cell lung cancer, diagnosed in June 2022.  She received concurrent chemoradiation and completed 6 cycles of carboplatin/Abraxane.  I do not feel she needs further chemotherapy at this time.  She is scheduled for her last radiation treatment today.  We will plan to pursue maintenance immunotherapy with durvalumab for 1 year, to start by the 1st week of October.  We will schedule her for CT chest at the end of September.  Recent COVID infection, August 2022, requiring hospitalization.  She remains severely fatigued.  Hypokalemia.  She has not been taking oral supplement, and so will start at 20 meq daily.  I also gave her a list of foods to include in her diet.  Osteopenia.  She is up to date on bone density as of April 2022, which are scheduled through Dr. Delena Bali office.  Odynophagia, secondary to radiation esophagitis.  Carafate did not help her symptoms.  This should improve with time and she can try liquid antacids.  Significant co-morbidities.  Plan: This is a pleasant 80 year old female with stage IIIB squamous cell lung cancer.  She completed 6 cycles of carboplatin/Abraxane and will finish out  radiation therapy today.  We reviewed her diagnosis and we will be taking over her future care and treatment.  Since she will be completing concurrent chemoradiation, we will plan to pursue maintenance immunotherapy with durvalumab every 3 weeks for a total of 1 year.  The schedule and potential toxicities of this therapy were reviewed, and we will schedule her for an education session with Melissa.  We will get her started by the 1st week of October.  She is scheduled for CT chest on September 29th, but she wishes to obtain that here, and so I will arrange this.  For her hypokalemia, I will place her on oral supplement 20 meq daily, and I gave her a list of foods to include in her diet.  We will see her  on September 30th with CBC, CMP, T4 and TSH and CT chest for repeat evaluation.  She and her husband understand and agree with this plan of care.  I have answered their questions and they know to call with any concerns.  Thank you for the opportunity to participate in the care of your patients   I provided 50 minutes of face-to-face time during this this encounter and > 50% was spent counseling as documented under my assessment and plan.    Derwood Kaplan, MD [facility] AT [facility] [address] [place] [address] Dept: [phone] Dept Fax: [phone]   I, Rita Ohara, am acting as scribe for Derwood Kaplan, MD  I have reviewed this report as typed by the medical scribe, and it is complete and accurate.

## 2020-09-26 LAB — CEA: CEA: 6.9 ng/mL — ABNORMAL HIGH (ref 0.0–4.7)

## 2020-09-27 ENCOUNTER — Other Ambulatory Visit: Payer: Self-pay | Admitting: Oncology

## 2020-09-27 DIAGNOSIS — C3490 Malignant neoplasm of unspecified part of unspecified bronchus or lung: Secondary | ICD-10-CM

## 2020-09-28 ENCOUNTER — Other Ambulatory Visit: Payer: Self-pay | Admitting: Oncology

## 2020-09-28 ENCOUNTER — Encounter: Payer: Self-pay | Admitting: Internal Medicine

## 2020-09-28 NOTE — Progress Notes (Signed)
DISCONTINUE ON PATHWAY REGIMEN - Non-Small Cell Lung     Administer weekly:     Paclitaxel      Carboplatin   **Always confirm dose/schedule in your pharmacy ordering system**  REASON: Continuation Of Treatment PRIOR TREATMENT: [id number]: Carboplatin AUC=2 + Paclitaxel 45 mg/m2 Weekly During Radiation TREATMENT RESPONSE: Partial Response (PR)  START ON PATHWAY REGIMEN - Non-Small Cell Lung     A cycle is every 14 days:     Durvalumab   **Always confirm dose/schedule in your pharmacy ordering system**  Patient Characteristics: Preoperative or Nonsurgical Candidate (Clinical Staging), Stage III - Nonsurgical Candidate (Nonsquamous and Squamous), PS = 0, 1 Therapeutic Status: Preoperative or Nonsurgical Candidate (Clinical Staging) AJCC T Category: cT1b AJCC N Category: cN3 AJCC M Category: cM0 AJCC 8 Stage Grouping: IIIB ECOG Performance Status: 1 Intent of Therapy: Curative Intent, Discussed with Patient

## 2020-10-01 ENCOUNTER — Other Ambulatory Visit: Payer: Self-pay | Admitting: Pharmacist

## 2020-10-01 DIAGNOSIS — C3431 Malignant neoplasm of lower lobe, right bronchus or lung: Secondary | ICD-10-CM

## 2020-10-01 NOTE — Progress Notes (Signed)
                                                                                                                                                             Patient Name: [name] MRN: [id number] DOB: [birth year] Referring Physician: Nelda Bucks (Profile Not Attached) Date of Service: 09/25/2020 [facility]                                                        End Of Treatment Note  Diagnoses: C34.31-Malignant neoplasm of lower lobe, right bronchus or lung  Cancer Staging:  Stage III non-small cell lung cancer with involvement of the right lower lobe, right hilum, and mediastinum.  Intent: Curative  Radiation Treatment Dates: 08/02/2020 through 09/25/2020 Site Technique Total Dose (Gy) Dose per Fx (Gy) Completed Fx Beam Energies  Lung, Right: Lung_Rt IMRT 60/60 2 30/30 6X   Narrative: The patient tolerated radiation therapy relatively well. She developed fatigue and anticipated changes of esophagitis from treatment.  Plan: The patient will receive a call in about one month from the radiation oncology department. She will continue follow up with Dr. Hinton Rao as well.   ________________________________________________    Carola Rhine, [facility]

## 2020-10-02 ENCOUNTER — Ambulatory Visit (HOSPITAL_BASED_OUTPATIENT_CLINIC_OR_DEPARTMENT_OTHER): Payer: Medicare Other | Admitting: Family

## 2020-10-02 ENCOUNTER — Encounter: Payer: Self-pay | Admitting: Oncology

## 2020-10-22 ENCOUNTER — Encounter: Payer: Self-pay | Admitting: Oncology

## 2020-10-24 ENCOUNTER — Inpatient Hospital Stay: Payer: Medicare Other

## 2020-10-24 ENCOUNTER — Telehealth: Payer: Self-pay | Admitting: Oncology

## 2020-10-24 ENCOUNTER — Encounter: Payer: Self-pay | Admitting: Oncology

## 2020-10-24 NOTE — Telephone Encounter (Signed)
Patient scheduled 10/6 Labs, CT Scans - 10/7 3:45 pm Follow Up Appt

## 2020-10-25 DIAGNOSIS — C3490 Malignant neoplasm of unspecified part of unspecified bronchus or lung: Secondary | ICD-10-CM | POA: Diagnosis not present

## 2020-10-25 DIAGNOSIS — R918 Other nonspecific abnormal finding of lung field: Secondary | ICD-10-CM | POA: Diagnosis not present

## 2020-10-25 DIAGNOSIS — R946 Abnormal results of thyroid function studies: Secondary | ICD-10-CM | POA: Diagnosis not present

## 2020-10-25 DIAGNOSIS — I700 Atherosclerosis of aorta: Secondary | ICD-10-CM | POA: Diagnosis not present

## 2020-10-25 DIAGNOSIS — J439 Emphysema, unspecified: Secondary | ICD-10-CM | POA: Diagnosis not present

## 2020-10-25 DIAGNOSIS — J9811 Atelectasis: Secondary | ICD-10-CM | POA: Diagnosis not present

## 2020-10-25 DIAGNOSIS — J90 Pleural effusion, not elsewhere classified: Secondary | ICD-10-CM | POA: Diagnosis not present

## 2020-10-25 LAB — HEPATIC FUNCTION PANEL
ALT: 14 (ref 7–35)
AST: 26 (ref 13–35)
Alkaline Phosphatase: 74 (ref 25–125)
Bilirubin, Total: 0.4

## 2020-10-25 LAB — CBC AND DIFFERENTIAL
HCT: 31 — AB (ref 36–46)
Hemoglobin: 10 — AB (ref 12.0–16.0)
Neutrophils Absolute: 3.55
Platelets: 136 — AB (ref 150–399)
WBC: 5

## 2020-10-25 LAB — BASIC METABOLIC PANEL
BUN: 17 (ref 4–21)
CO2: 24 — AB (ref 13–22)
Chloride: 110 — AB (ref 99–108)
Creatinine: 0.8 (ref 0.5–1.1)
Glucose: 165
Potassium: 3.8 (ref 3.4–5.3)
Sodium: 142 (ref 137–147)

## 2020-10-25 LAB — CBC: RBC: 3.31 — AB (ref 3.87–5.11)

## 2020-10-25 LAB — COMPREHENSIVE METABOLIC PANEL
Albumin: 3.8 (ref 3.5–5.0)
Calcium: 8.9 (ref 8.7–10.7)

## 2020-10-25 LAB — TSH: TSH: 0.76 (ref 0.41–5.90)

## 2020-10-25 NOTE — Progress Notes (Signed)
[facility]  [address] [place],  [place]  [address] [phone]  Clinic Day:  10/26/2020  Referring physician: [name], MD  This document serves as a record of services personally performed by [name] Poisson, MD. It was created on their behalf by [facility] E, a trained medical scribe. The creation of this record is based on the scribe's personal observations and the provider's statements to them.  CHIEF COMPLAINT:  CC: Stage IIIB (T1b N3 M0) squamous cell lung cancer  Current Treatment:  Concurrent chemoradiation with carboplatin/paclitaxel   HISTORY OF PRESENT ILLNESS:  [name] is a 80 y.o. female referred by Dr. Fanny [name]. [name] for a transfer of care for continued maintenance and treatment of stage IIIB (T1b, N3, M0) squamous cell lung cancer diagnosed in June 2022.  This began when the patient started to experience intermittent chest pain and shortness of breath, and followed up with cardiology for further evaluation.  Chest x-ray was abnormal.  Due to her history of COPD and long history of smoking she was referred to pulmonology.  CT chest from May revealed a new highly suspicious spiculated pulmonary nodule within the superior lateral aspect of the right lower lobe measuring 1.5 x 1.0 x 1.4 cm with central small cavitation highly suspicious to be a neoplastic nodule.  There was also nodularity along the upper wall of the left mainstem bronchus of uncertain etiology but also suspicious for additional neoplastic process.  CT super D of the chest from June revealed enlargement of the thick-walled spiculated cavitary nodule in the superior segment of the right lower lobe measuring 1.6 x 1.6.  There was also numerous mediastinal lymph nodes some of which are enlarged including high AP window lymph node measuring 1.2 cm, lower right paratracheal lymph node measuring 1.4 cm. Final pathology confirmed malignant cells consistent  with non-small cell carcinoma. Overall, the morphology and immunophenotype favor a squamous cell carcinoma.  Staging PET scan showed the hypermetabolic right lower lobe lung lesion consistent with primary lung neoplasm.  There was also mediastinal and hilar lymphadenopathy but no findings to suggest abdominal/pelvic metastatic disease or osseous metastatic disease for a T1b N3 M0, stage IIIB.  MRI brain was negative for metastasis.  She was referred to Dr. Julien [name], oncology, and was started concurrent chemoradiation with weekly carboplatin/paclitaxel.  Her 1st cycle was July 11th.  With the 3rd cycle, she developed a reaction to paclitaxel and so was switched to Abraxane.  She has received 6 cycles of therapy.   She has a medical history significant for coronary artery disease status post stent placement, carotid artery disease, colon obstruction status post surgical intervention, depression, COPD, DVT, GERD, intracranial hemorrhage, hypertension, strokes, anemia and Barrett's esophagus. [name] states that she has quit smoking since her diagnosis in June after 60 pack years.  She contracted COVID back in August and was quite ill and required hospitalization for 5 days.  She has had hypokalemia and supplement was ordered.   INTERVAL HISTORY:  [name] is here for routine follow up to review recent imaging results. CT chest from October 6th revealed interval decrease in size of the right lower lobe lung lesion, now 8 mm, previously 15 mm. Progressive airspace opacity and atelectasis in the left upper lobe. Persistent small left effusion and left lower lobe atelectasis or infiltrate. New 15.5 mm sub solid nodular lesion in the right upper lobe just above the minor fissure. It is possible this is an area of inflammation but  attention on future studies is suggested. Stable emphysematous changes and pulmonary scarring. Slightly smaller borderline enlarged mediastinal and hilar lymph nodes. She completed radiation therapy on  September 6th. I do feel she has infection and probable early pneumonia. She does report some chest congestion and productive cough with creamy yellow sputum. She denies hemoptysis. I will place her on Augmentin 875 mg BID for 10 days. She denies chest pain or shortness of breath. Hemoglobin has mildly improved from 9.6 to 10.0, and white count and platelets are normal. Chemistries are unremarkable. Her  appetite is good, and her weight is stable since her last visit.  She denies fever, chills or other signs of infection.  She denies nausea, vomiting, bowel issues, or abdominal pain.  She denies sore throat dyspnea, or chest pain. Her husband is here with her today.  REVIEW OF SYSTEMS:  Review of Systems  Constitutional: Negative.  Negative for appetite change, chills, fatigue, fever and unexpected weight change.  HENT:  Negative.    Eyes: Negative.   Respiratory:  Positive for cough (productive with creamy yellow sputum). Negative for chest tightness, hemoptysis, shortness of breath and wheezing.   Cardiovascular: Negative.  Negative for chest pain, leg swelling and palpitations.  Gastrointestinal: Negative.  Negative for abdominal distention, abdominal pain, blood in stool, constipation, diarrhea, nausea and vomiting.  Endocrine: Negative.   Genitourinary: Negative.  Negative for difficulty urinating, dysuria, frequency and hematuria.   Musculoskeletal: Negative.  Negative for arthralgias, back pain, flank pain, gait problem and myalgias.  Skin: Negative.   Neurological: Negative.  Negative for dizziness, extremity weakness, gait problem, headaches, light-headedness, numbness, seizures and speech difficulty.  Hematological: Negative.   Psychiatric/Behavioral: Negative.  Negative for depression and sleep disturbance. The patient is not nervous/anxious.     VITALS:  Blood pressure 139/65, pulse 90, temperature 98.8 F (37.1 C), temperature source Oral, resp. rate 18, height 4\' 11"  (1.499 m),  weight 143 lb (64.9 kg), SpO2 95 %.  Wt Readings from Last 3 Encounters:  10/26/20 143 lb (64.9 kg)  09/25/20 142 lb 6.4 oz (64.6 kg)  09/17/20 143 lb (64.9 kg)    Body mass index is 28.88 kg/m.  Performance status (ECOG): 1 - Symptomatic but completely ambulatory  PHYSICAL EXAM:  Physical Exam Constitutional:      General: She is not in acute distress.    Appearance: Normal appearance. She is normal weight.  HENT:     Head: Normocephalic and atraumatic.  Eyes:     General: No scleral icterus.    Extraocular Movements: Extraocular movements intact.     Conjunctiva/sclera: Conjunctivae normal.     Pupils: Pupils are equal, round, and reactive to light.  Cardiovascular:     Rate and Rhythm: Normal rate and regular rhythm.     Pulses: Normal pulses.     Heart sounds: Normal heart sounds. No murmur heard.   No friction rub. No gallop.  Pulmonary:     Effort: Pulmonary effort is normal. No respiratory distress.     Breath sounds: Decreased breath sounds (of the left upper lung) present.  Abdominal:     General: Bowel sounds are normal. There is no distension.     Palpations: Abdomen is soft. There is no hepatomegaly, splenomegaly or mass.     Tenderness: There is no abdominal tenderness.  Musculoskeletal:        General: Normal range of motion.     Cervical back: Normal range of motion and neck supple.  Right lower leg: No edema.     Left lower leg: No edema.  Lymphadenopathy:     Cervical: No cervical adenopathy.  Skin:    General: Skin is warm and dry.  Neurological:     General: No focal deficit present.     Mental Status: She is alert and oriented to person, place, and time. Mental status is at baseline.  Psychiatric:        Mood and Affect: Mood normal.        Behavior: Behavior normal.        Thought Content: Thought content normal.        Judgment: Judgment normal.    LABS:   CBC Latest Ref Rng & Units 10/25/2020 09/25/2020 09/17/2020  WBC - 5.0 3.5 2.7(L)   Hemoglobin 12.0 - 16.0 10.0(A) 9.6(A) 9.6(L)  Hematocrit 36 - 46 31(A) 29(A) 29.2(L)  Platelets 150 - 399 136(A) 187 193   CMP Latest Ref Rng & Units 10/25/2020 09/25/2020 09/17/2020  Glucose 70 - 99 mg/dL - - 166(H)  BUN 4 - 21 17 18 13   Creatinine 0.5 - 1.1 0.8 0.8 0.77  Sodium 137 - 147 142 143 146(H)  Potassium 3.4 - 5.3 3.8 3.0(A) 3.1(L)  Chloride 99 - 108 110(A) 108 111  CO2 13 - 22 24(A) 25(A) 25  Calcium 8.7 - 10.7 8.9 8.8 8.7(L)  Total Protein 6.5 - 8.1 g/dL - - 6.1(L)  Total Bilirubin 0.3 - 1.2 mg/dL - - 0.6  Alkaline Phos 25 - 125 74 69 75  AST 13 - 35 26 24 14(L)  ALT 7 - 35 14 15 10      Lab Results  Component Value Date   CEA1 6.9 (H) 09/25/2020   /  CEA  Date Value Ref Range Status  09/25/2020 6.9 (H) 0.0 - 4.7 ng/mL Final    Comment:    (NOTE)                             Nonsmokers          <3.9                             Smokers             <5.6 [employer] Electrochemiluminescence Immunoassay (ECLIA) Values obtained with different assay methods or kits cannot be used interchangeably.  Results cannot be interpreted as absolute evidence of the presence or absence of malignant disease. Performed At: [facility] [place], [place] [id number] Rush Farmer MD GG:[id number]     Lab Results  Component Value Date   IRONPCTSAT 7.7 (L) 06/19/2020   Lab Results  Component Value Date   LDH 166 06/19/2020    STUDIES:  No results found.   EXAM: 10/25/2020 CT CHEST WITH CONTRAST  TECHNIQUE: Multidetector CT imaging of the chest was performed during intravenous contrast administration.  CONTRAST: 60 cc Isovue 370  COMPARISON: Multiple previous imaging studies. The most recent is 09/05/2020  FINDINGS: Cardiovascular: The heart is normal in size. No pericardial effusion. Stable atherosclerotic calcifications involving the thoracic aorta and branch vessels including three-vessel coronary artery calcifications. The  pulmonary arteries are unremarkable. Mediastinum/Nodes: Persistent borderline enlarged mediastinal and hilar lymph nodes but slight interval decrease in size when compared to the most recent CT scan. The prevascular node on image 35/2 measures 9.5 mm and previously measured 11 mm. Pretracheal  node on image number 35/2 measures 6.5 mm and previously measured 12 mm. Subcarinal node on image 51/2 measures 9 mm and previously measured 14 mm. The esophagus is grossly normal. Lungs/Pleura: Stable underlying emphysematous changes with areas of pulmonary scarring. The right lower lobe lung lesion measures a maximum of 8 mm on image 63/4. This previously measured 15 mm. Nearby fiducials are noted. Progressive airspace opacity and atelectasis in the left upper lobe. There is also a persistent small left effusion and left lower lobe atelectasis or infiltrate. New sub solid nodular lesion noted in the right upper lobe just above the minor fissure. This measures 15.5 mm on image 47/4. It is possible this is an area of inflammation but attention on future studies is suggested. Upper Abdomen: No significant upper abdominal findings. No hepatic or adrenal gland lesions. Stable vascular calcifications. Musculoskeletal: No breast masses, supraclavicular or axillary adenopathy. The bony thorax is intact.  IMPRESSION: 1. Interval decrease in size of the right lower lobe lung lesion. 2. Progressive airspace opacity and atelectasis in the left upper lobe. 3. Persistent small left effusion and left lower lobe atelectasis or infiltrate. 4. New 15.5 mm sub solid nodular lesion in the right upper lobe just above the minor fissure. It is possible this is an area of inflammation but attention on future studies is suggested. Recommend follow-up noncontrast chest CT in 3-4 months. 5. Stable emphysematous changes and pulmonary scarring. 6. Slightly smaller borderline enlarged mediastinal and hilar  lymph nodes. 7. Stable atherosclerotic calcifications involving the aorta and branch vessels including three-vessel coronary artery calcifications. Aortic Atherosclerosis (ICD10-I70.0) and Emphysema (ICD10-J43.9).  HISTORY:   Allergies:  Allergies  Allergen Reactions   Pregabalin Swelling    Tongue swelling   Paclitaxel    Sulfonamide Derivatives Swelling    Childhood reaction    Current Medications: Current Outpatient Medications  Medication Sig Dispense Refill   amoxicillin-clavulanate (AUGMENTIN) 875-125 MG tablet Take 1 tablet by mouth 2 (two) times daily for 10 days. 20 tablet 0   aspirin EC 81 MG tablet Take 2 tablets (162 mg total) by mouth in the morning. Okay to restart this medication on 07/04/2020 30 tablet 11   atorvastatin (LIPITOR) 80 MG tablet Take 80 mg by mouth every evening.      buPROPion (WELLBUTRIN XL) 150 MG 24 hr tablet Take 1 tablet (150 mg total) by mouth daily. 90 tablet 3   CALCIUM PO Take 1 tablet by mouth in the morning.     Cholecalciferol (VITAMIN D3 PO) Take 1 tablet by mouth in the morning.     fish oil-omega-3 fatty acids 1000 MG capsule Take 1,000 mg by mouth in the morning.     fluticasone (FLONASE) 50 MCG/ACT nasal spray Place 1 spray into both nostrils daily as needed for allergies.     folic acid (FOLVITE) 474 MCG tablet Take 800 mcg by mouth in the morning.     HYDROcodone-acetaminophen (HYCET) 7.5-325 mg/15 ml solution Take 10 mLs by mouth 4 (four) times daily as needed for moderate pain. 120 mL 0   lidocaine-prilocaine (EMLA) cream Apply 1 application topically as needed. 30 g 2   metoprolol succinate (TOPROL-XL) 50 MG 24 hr tablet TAKE 1 TABLET BY MOUTH  DAILY 90 tablet 3   mirtazapine (REMERON) 45 MG tablet Take 45 mg by mouth at bedtime.      nitroGLYCERIN (NITROSTAT) 0.4 MG SL tablet DISSOLVE 1 TABLET UNDER THE TONGUE EVERY 5 MINUTES AS  NEEDED FOR CHEST PAIN. MAX  OF 3 TABLETS IN 15 MINUTES. CALL 911 IF PAIN PERSISTS. 25 tablet 3    ondansetron (ZOFRAN) 4 MG tablet Take 4 mg by mouth every 8 (eight) hours as needed for nausea or vomiting.     pantoprazole (PROTONIX) 40 MG tablet Take 40 mg by mouth in the morning.     potassium chloride SA (KLOR-CON) 20 MEQ tablet Take 1 tablet (20 mEq total) by mouth daily. 7 tablet 0   prochlorperazine (COMPAZINE) 10 MG tablet Take 1 tablet (10 mg total) by mouth every 6 (six) hours as needed for nausea or vomiting. 30 tablet 0   sertraline (ZOLOFT) 100 MG tablet Take 200 mg by mouth in the morning.     sucralfate (CARAFATE) 1 g tablet Take 1 tablet (1 g total) by mouth 4 (four) times daily. Dissolve each tablet in 15 cc water before use. 120 tablet 2   No current facility-administered medications for this visit.     ASSESSMENT & PLAN:   Assessment:    Stage IIIB (T1b N3 M0) squamous cell lung cancer, diagnosed in June 2022.  She received concurrent chemoradiation and completed 6 cycles of carboplatin/Abraxane.  I do not feel she needs further chemotherapy at this time.  She completed radiation therapy on September 6th, and CT imaging has shown a good response. We will plan to initiate maintenance immunotherapy with durvalumab for 1 year on October 11th.  Recent COVID infection, August 2022, requiring hospitalization.   Hypokalemia, resolved on oral supplement 20 meq daily.   Osteopenia.  She is up to date on bone density as of April 2022, which are scheduled through Dr. Delena Bali office.  Significant co-morbidities.  Progressive airspace opacity and atelectasis in the left upper lobe on CT imaging, which likely represents infection. I will place her on antibiotics with Augmentin 875 mg BID x10 days.   7.  Anemia, probably largely from chemotherapy.  Plan: CT imaging has shown a good response to radiation therapy with the right lower lobe lung lesion now measuring 8 mm, previously 15 mm. However, there is progressive airspace opacity and atelectasis in the left upper lobe which  likely represents infection and probable early pneumonia. I will place her on antibiotics with Augmentin 875 BID for 10 days. We will plan to initiate maintenance immunotherapy with durvalumab every 2 weeks for a total of 1 year next week on October 11th.  The schedule and potential toxicities of this therapy were reviewed and we will schedule her for an education session with [facility] on October 10th.  We will see her in 2 weeks with CBC, CMP, T4 and TSH prior to her 2nd cycle of maintenance durvalumab.  She and her husband understand and agree with this plan of care.  I have answered their questions and they know to call with any concerns.   I provided 15 minutes of face-to-face time during this this encounter and > 50% was spent counseling as documented under my assessment and plan.    Derwood Kaplan, MD [facility] AT [facility] [address] [place] [address] Dept: [phone] Dept Fax: [phone]   I, Rita Ohara, am acting as scribe for Derwood Kaplan, MD  I have reviewed this report as typed by the medical scribe, and it is complete and accurate.

## 2020-10-26 ENCOUNTER — Ambulatory Visit: Payer: Medicare Other

## 2020-10-26 ENCOUNTER — Encounter: Payer: Self-pay | Admitting: Oncology

## 2020-10-26 ENCOUNTER — Other Ambulatory Visit: Payer: Self-pay | Admitting: Oncology

## 2020-10-26 ENCOUNTER — Inpatient Hospital Stay: Payer: Medicare Other | Attending: Internal Medicine | Admitting: Oncology

## 2020-10-26 ENCOUNTER — Ambulatory Visit: Payer: Medicare Other | Admitting: Oncology

## 2020-10-26 ENCOUNTER — Other Ambulatory Visit: Payer: Self-pay | Admitting: Pharmacist

## 2020-10-26 ENCOUNTER — Telehealth: Payer: Self-pay | Admitting: Oncology

## 2020-10-26 VITALS — BP 139/65 | HR 90 | Temp 98.8°F | Resp 18 | Ht 59.0 in | Wt 143.0 lb

## 2020-10-26 DIAGNOSIS — C3431 Malignant neoplasm of lower lobe, right bronchus or lung: Secondary | ICD-10-CM | POA: Insufficient documentation

## 2020-10-26 DIAGNOSIS — Z452 Encounter for adjustment and management of vascular access device: Secondary | ICD-10-CM | POA: Insufficient documentation

## 2020-10-26 DIAGNOSIS — M8580 Other specified disorders of bone density and structure, unspecified site: Secondary | ICD-10-CM | POA: Insufficient documentation

## 2020-10-26 DIAGNOSIS — J189 Pneumonia, unspecified organism: Secondary | ICD-10-CM

## 2020-10-26 DIAGNOSIS — D6481 Anemia due to antineoplastic chemotherapy: Secondary | ICD-10-CM | POA: Insufficient documentation

## 2020-10-26 DIAGNOSIS — Z5112 Encounter for antineoplastic immunotherapy: Secondary | ICD-10-CM | POA: Insufficient documentation

## 2020-10-26 DIAGNOSIS — K219 Gastro-esophageal reflux disease without esophagitis: Secondary | ICD-10-CM | POA: Insufficient documentation

## 2020-10-26 DIAGNOSIS — R5383 Other fatigue: Secondary | ICD-10-CM | POA: Insufficient documentation

## 2020-10-26 MED ORDER — AMOXICILLIN-POT CLAVULANATE 875-125 MG PO TABS: 1.0000 | ORAL_TABLET | Freq: Two times a day (BID) | ORAL | 0 refills | Status: AC

## 2020-10-26 NOTE — Telephone Encounter (Signed)
Per 10/7 LOS, patient scheduled for 10/24, 10/25 Appt's.  Gave patient Appt Summary

## 2020-10-29 ENCOUNTER — Inpatient Hospital Stay (INDEPENDENT_AMBULATORY_CARE_PROVIDER_SITE_OTHER): Payer: Medicare Other | Admitting: Hematology and Oncology

## 2020-10-29 ENCOUNTER — Ambulatory Visit
Admission: RE | Admit: 2020-10-29 | Discharge: 2020-10-29 | Disposition: A | Discharge status: Home / Self Care | Payer: Medicare Other | Source: Ambulatory Visit | Attending: Radiation Oncology | Admitting: Radiation Oncology

## 2020-10-29 ENCOUNTER — Other Ambulatory Visit: Payer: Medicare Other | Admitting: Hematology and Oncology

## 2020-10-29 ENCOUNTER — Telehealth: Payer: Self-pay | Admitting: Hematology and Oncology

## 2020-10-29 ENCOUNTER — Encounter (HOSPITAL_BASED_OUTPATIENT_CLINIC_OR_DEPARTMENT_OTHER): Payer: Self-pay | Admitting: Family

## 2020-10-29 ENCOUNTER — Other Ambulatory Visit: Payer: Self-pay

## 2020-10-29 ENCOUNTER — Ambulatory Visit (INDEPENDENT_AMBULATORY_CARE_PROVIDER_SITE_OTHER): Payer: Medicare Other | Admitting: Family

## 2020-10-29 ENCOUNTER — Other Ambulatory Visit: Payer: Self-pay | Admitting: Pharmacist

## 2020-10-29 VITALS — BP 112/50 | HR 70 | Ht 59.0 in | Wt 144.4 lb

## 2020-10-29 DIAGNOSIS — C3431 Malignant neoplasm of lower lobe, right bronchus or lung: Secondary | ICD-10-CM

## 2020-10-29 DIAGNOSIS — I25118 Atherosclerotic heart disease of native coronary artery with other forms of angina pectoris: Secondary | ICD-10-CM | POA: Diagnosis not present

## 2020-10-29 DIAGNOSIS — Z8673 Personal history of transient ischemic attack (TIA), and cerebral infarction without residual deficits: Secondary | ICD-10-CM

## 2020-10-29 DIAGNOSIS — C3490 Malignant neoplasm of unspecified part of unspecified bronchus or lung: Secondary | ICD-10-CM

## 2020-10-29 DIAGNOSIS — I10 Essential (primary) hypertension: Secondary | ICD-10-CM | POA: Diagnosis not present

## 2020-10-29 DIAGNOSIS — I6523 Occlusion and stenosis of bilateral carotid arteries: Secondary | ICD-10-CM

## 2020-10-29 DIAGNOSIS — E785 Hyperlipidemia, unspecified: Secondary | ICD-10-CM

## 2020-10-29 MED ORDER — ZOLPIDEM TARTRATE 5 MG PO TABS: 5.0000 mg | ORAL_TABLET | Freq: Every evening | ORAL | 0 refills | Status: DC | PRN

## 2020-10-29 MED FILL — Durvalumab Soln for IV Infusion 500 MG/10ML (50 MG/ML): INTRAVENOUS | Qty: 12.4 | Status: AC

## 2020-10-29 NOTE — Progress Notes (Signed)
[facility]  [phone] [phone]  Patient Care Team: Lowella Dandy, NP as PCP - General (Internal Medicine) Dorothy Spark, MD (Inactive) as PCP - Cardiology (Cardiology) Constance Haw, MD as PCP - Electrophysiology (Cardiology) Lowella Dandy, NP as Nurse Practitioner (Internal Medicine)   Name of the patient: [name]  [id number]  26-May-1940   Date of visit: 10/29/20  Diagnosis- Lung cancer  Chief complaint/Reason for visit- Initial Meeting for [facility], preparing for starting chemotherapy    Heme/Onc history:  Oncology History  Malignant neoplasm of bronchus of right lower lobe ([place])  07/10/2020 Initial Diagnosis   Stage III squamous cell carcinoma of right lung ([place])   07/10/2020 Cancer Staging   Staging form: Lung, AJCC 8th Edition - Clinical: Stage IIIB (cT1b, cN3, cM0) - Signed by Curt Bears, MD on 07/10/2020   07/30/2020 - 09/17/2020 Chemotherapy          10/30/2020 -  Chemotherapy   Patient is on Treatment Plan : LUNG Durvalumab q14d       Interval history-  Patient presents to chemo care clinic today for initial meeting in preparation for starting chemotherapy. I introduced the chemo care clinic and we discussed that the role of the clinic is to assist those who are at an increased risk of emergency room visits and/or complications during the course of chemotherapy treatment. We discussed that the increased risk takes into account factors such as age, performance status, and co-morbidities. We also discussed that for some, this might include barriers to care such as not having a primary care provider, lack of insurance/transportation, or not being able to afford medications. We discussed that the goal of the program is to help prevent unplanned ER visits and help reduce complications during chemotherapy. We do this by discussing specific risk factors to each  individual and identifying ways that we can help improve these risk factors and reduce barriers to care.   Allergies  Allergen Reactions   Pregabalin Swelling    Tongue swelling   Paclitaxel    Sulfonamide Derivatives Swelling    Childhood reaction    Past Medical History:  Diagnosis Date   Acute respiratory failure with hypoxia s/p tracheostomy 07/20/2013   Anemia    a. 7-08/2013 felt due to recent critical illness/chronic disease.   Arthritis    handds, knees & back    Barrett's esophagus    CAD (coronary artery disease)    a. Mod dz 2010 initially mgd medically. b. 12/2012 - angina s/p PTCA/DES to mid-circumflex, PTCA/DES to first OM.    Carotid artery disease ([place])    a. 60-70% bilat ICA stenosis by dopplers 05/2012.   Chronic back pain    deteriorating;DDD   Colonic obstruction due to suspected colitis; s/p colectomy/colostomy    a. See SBO.   Complication of anesthesia    hard to wake up from anesthesia    COPD (chronic obstructive pulmonary disease) (HCC)    Depression    takes Remeron and Zoloft daily   Dizziness    was on Bentyl which caused this-took off of it and no problems since   DVT of axillary vein, acute right ([place])    a. Dx 07/2013 felt due to R IJ central line that had been inserted 7/14. Not on anticoag due to GIB issues and small cerebral bleed.   GERD (gastroesophageal reflux disease)    Heart murmur    History of  bronchitis    > 35yrs ago    History of colon polyps    History of hiatal hernia    History of kidney stones    was told it was stable but doesn't know for sure that she ever passed it   HTN (hypertension)    Hypertension    takes Metoprolol daily   Intracranial hemorrhage ([place])    a. 08/11/13 - per DC summary, tiny SAH vs SDH done for altered mentation, anticoag stopped including aspirin.   Joint pain    Neck pain    DDD   Other and unspecified hyperlipidemia    takes Lipitor daily   Pneumonia    hx of->15 yrs ago   Protein-calorie  malnutrition, severe w/ electrolyte imbalance    a. Severe hypoalbuminemia leading to 3rd spacing including anasarca and pulm edema 07/2013.   SBO (small bowel obstruction) ([place])    a. Lysis of adhesions and ovarian cystectomy 04/2013 for sBO. b. Admitted 07/2013 for colonic obstruction due to suspected colitis, s/p partial colectomy/colostomy 08/01/13. Admission complicated by anasarca, acute resp failure requiring tracheostomy, decannulated 08/25/13. c. Recurrent SBO8/2015, NGT placed for decompression.    Stroke [facility]) early 80's   right sided weakness--TIA    Past Surgical History:  Procedure Laterality Date   ABDOMINAL HYSTERECTOMY     APPENDECTOMY     BOWEL RESECTION N/A 08/01/2013   Procedure: SMALL BOWEL RESECTION;  Surgeon: Harl Bowie, MD;  Location: [place];  Service: General;  Laterality: N/A;   BRONCHIAL BIOPSY  07/02/2020   Procedure: BRONCHIAL BIOPSIES;  Surgeon: Collene Gobble, MD;  Location: [facility] ENDOSCOPY;  Service: Pulmonary;;   BRONCHIAL BRUSHINGS  07/02/2020   Procedure: BRONCHIAL BRUSHINGS;  Surgeon: Collene Gobble, MD;  Location: [facility] ENDOSCOPY;  Service: Pulmonary;;   BRONCHIAL NEEDLE ASPIRATION BIOPSY  07/02/2020   Procedure: BRONCHIAL NEEDLE ASPIRATION BIOPSIES;  Surgeon: Collene Gobble, MD;  Location: [place];  Service: Pulmonary;;   BRONCHIAL WASHINGS  07/02/2020   Procedure: BRONCHIAL WASHINGS;  Surgeon: Collene Gobble, MD;  Location: [place] ENDOSCOPY;  Service: Pulmonary;;   cataract surgery Bilateral    CHOLECYSTECTOMY     COLON RESECTION     COLON SURGERY     COLOSTOMY N/A 08/01/2013   Procedure: COLOSTOMY;  Surgeon: Harl Bowie, MD;  Location: [place];  Service: General;  Laterality: N/A;   COLOSTOMY TAKEDOWN  01/24/2014   dr blackman   COLOSTOMY TAKEDOWN N/A 01/24/2014   Procedure: COLOSTOMY TAKEDOWN;  Surgeon: Coralie Keens, MD;  Location: [place];  Service: General;  Laterality: N/A;   CORONARY ANGIOPLASTY WITH STENT PLACEMENT  12/20/2012   STENT TO  OM         DR COOPER   ENDARTERECTOMY Right 08/08/2014   Procedure: RIGHT CAROTID ENDARTERECTOMY WITH HEMASHIELD PATCH ANGIOPLASTY;  Surgeon: Elam Dutch, MD;  Location: [place];  Service: Vascular;  Laterality: Right;   FIDUCIAL MARKER PLACEMENT  07/02/2020   Procedure: FIDUCIAL MARKER PLACEMENT;  Surgeon: Collene Gobble, MD;  Location: [facility] ENDOSCOPY;  Service: Pulmonary;;   FOOT SURGERY Left    HAND SURGERY Bilateral    for ganglion cysts   HEMOSTASIS CONTROL  07/02/2020   Procedure: HEMOSTASIS CONTROL;  Surgeon: Collene Gobble, MD;  Location: [facility] ENDOSCOPY;  Service: Pulmonary;;  ice saline and epi   IR IMAGING GUIDED PORT INSERTION  08/23/2020   LAPAROTOMY N/A 08/01/2013   Procedure: EXPLORATORY LAPAROTOMY;  Surgeon: Harl Bowie, MD;  Location: [place];  Service: General;  Laterality: N/A;   LEFT HEART CATH AND CORONARY ANGIOGRAPHY N/A 01/10/2019   Procedure: LEFT HEART CATH AND CORONARY ANGIOGRAPHY;  Surgeon: Troy Sine, MD;  Location: [place] CV LAB;  Service: Cardiovascular;  Laterality: N/A;   LEFT HEART CATHETERIZATION WITH CORONARY ANGIOGRAM N/A 12/20/2012   Procedure: LEFT HEART CATHETERIZATION WITH CORONARY ANGIOGRAM;  Surgeon: Blane Ohara, MD;  Location: [facility] CATH LAB;  Service: Cardiovascular;  Laterality: N/A;   PARTIAL COLECTOMY N/A 08/01/2013   Procedure: PARTIAL COLECTOMY;  Surgeon: Harl Bowie, MD;  Location: [place];  Service: General;  Laterality: N/A;   ROTATOR CUFF REPAIR Left    TONSILLECTOMY     TRACHEOSTOMY     feinstein   VIDEO BRONCHOSCOPY WITH ENDOBRONCHIAL NAVIGATION N/A 07/02/2020   Procedure: VIDEO BRONCHOSCOPY WITH ENDOBRONCHIAL NAVIGATION;  Surgeon: Collene Gobble, MD;  Location: [place] ENDOSCOPY;  Service: Pulmonary;  Laterality: N/A;    Social History   Socioeconomic History   Marital status: Married    Spouse name: Not on file   Number of children: Not on file   Years of education: Not on file   Highest education level: Not on file   Occupational History   Occupation: full time  Tobacco Use   Smoking status: Former    Packs/day: 1.00    Years: 60.00    Pack years: 60.00    Types: Cigarettes    Quit date: 06/2020    Years since quitting: 0.3   Smokeless tobacco: Never   Tobacco comments:    Stopped smoking in May 2022. ARJ 08/10/20  Vaping Use   Vaping Use: Never used  Substance and Sexual Activity   Alcohol use: No    Alcohol/week: 0.0 standard drinks   Drug use: No   Sexual activity: Not Currently    Birth control/protection: Surgical    Comment: Hysterectomy  Other Topics Concern   Not on file  Social History Narrative   Not on file   Social Determinants of Health   Financial Resource Strain: Not on file  Food Insecurity: Not on file  Transportation Needs: Not on file  Physical Activity: Not on file  Stress: Not on file  Social Connections: Not on file  Intimate Partner Violence: Not At Risk   Fear of Current or Ex-Partner: No   Emotionally Abused: No   Physically Abused: No   Sexually Abused: No    Family History  Problem Relation Age of Onset   Varicose Veins Mother    Heart disease Father    Heart attack Father    AAA (abdominal aortic aneurysm) Father    Heart disease Other      Current Outpatient Medications:    zolpidem (AMBIEN) 5 MG tablet, Take 1 tablet (5 mg total) by mouth at bedtime as needed for sleep., Disp: 30 tablet, Rfl: 0   amoxicillin-clavulanate (AUGMENTIN) 875-125 MG tablet, Take 1 tablet by mouth 2 (two) times daily for 10 days., Disp: 20 tablet, Rfl: 0   aspirin EC 81 MG tablet, Take 2 tablets (162 mg total) by mouth in the morning. Okay to restart this medication on 07/04/2020, Disp: 30 tablet, Rfl: 11   atorvastatin (LIPITOR) 80 MG tablet, Take 80 mg by mouth every evening. , Disp: , Rfl:    buPROPion (WELLBUTRIN XL) 150 MG 24 hr tablet, Take 1 tablet (150 mg total) by mouth daily., Disp: 90 tablet, Rfl: 3   CALCIUM PO, Take 1 tablet by mouth in the morning.,  Disp: , Rfl:    Cholecalciferol (VITAMIN D3 PO), Take 1 tablet by mouth in the morning., Disp: , Rfl:    fish oil-omega-3 fatty acids 1000 MG capsule, Take 1,000 mg by mouth in the morning., Disp: , Rfl:    fluticasone (FLONASE) 50 MCG/ACT nasal spray, Place 1 spray into both nostrils daily as needed for allergies., Disp: , Rfl:    folic acid (FOLVITE) 622 MCG tablet, Take 800 mcg by mouth in the morning., Disp: , Rfl:    HYDROcodone-acetaminophen (HYCET) 7.5-325 mg/15 ml solution, Take 10 mLs by mouth 4 (four) times daily as needed for moderate pain., Disp: 120 mL, Rfl: 0   lidocaine-prilocaine (EMLA) cream, Apply 1 application topically as needed., Disp: 30 g, Rfl: 2   metoprolol succinate (TOPROL-XL) 50 MG 24 hr tablet, TAKE 1 TABLET BY MOUTH  DAILY, Disp: 90 tablet, Rfl: 3   mirtazapine (REMERON) 45 MG tablet, Take 45 mg by mouth at bedtime. , Disp: , Rfl:    nitroGLYCERIN (NITROSTAT) 0.4 MG SL tablet, DISSOLVE 1 TABLET UNDER THE TONGUE EVERY 5 MINUTES AS  NEEDED FOR CHEST PAIN. MAX  OF 3 TABLETS IN 15 MINUTES. CALL 911 IF PAIN PERSISTS., Disp: 25 tablet, Rfl: 3   ondansetron (ZOFRAN) 4 MG tablet, Take 4 mg by mouth every 8 (eight) hours as needed for nausea or vomiting., Disp: , Rfl:    pantoprazole (PROTONIX) 40 MG tablet, Take 40 mg by mouth in the morning., Disp: , Rfl:    potassium chloride SA (KLOR-CON) 20 MEQ tablet, Take 1 tablet (20 mEq total) by mouth daily., Disp: 7 tablet, Rfl: 0   prochlorperazine (COMPAZINE) 10 MG tablet, Take 1 tablet (10 mg total) by mouth every 6 (six) hours as needed for nausea or vomiting., Disp: 30 tablet, Rfl: 0   sertraline (ZOLOFT) 100 MG tablet, Take 200 mg by mouth in the morning., Disp: , Rfl:    sucralfate (CARAFATE) 1 g tablet, Take 1 tablet (1 g total) by mouth 4 (four) times daily. Dissolve each tablet in 15 cc water before use., Disp: 120 tablet, Rfl: 2  CMP Latest Ref Rng & Units 10/25/2020  Glucose 70 - 99 mg/dL -  BUN 4 - 21 17  Creatinine 0.5  - 1.1 0.8  Sodium 137 - 147 142  Potassium 3.4 - 5.3 3.8  Chloride 99 - 108 110(A)  CO2 13 - 22 24(A)  Calcium 8.7 - 10.7 8.9  Total Protein 6.5 - 8.1 g/dL -  Total Bilirubin 0.3 - 1.2 mg/dL -  Alkaline Phos 25 - 125 74  AST 13 - 35 26  ALT 7 - 35 14   CBC Latest Ref Rng & Units 10/25/2020  WBC - 5.0  Hemoglobin 12.0 - 16.0 10.0(A)  Hematocrit 36 - 46 31(A)  Platelets 150 - 399 136(A)    No images are attached to the encounter.  No results found.   Assessment and plan- Patient is a 80 y.o. female who presents to [facility] for initial meeting in preparation for starting chemotherapy for the treatment of lung cancer.   Chemo Care Clinic/High Risk for ER/Hospitalization during chemotherapy- We discussed the role of the chemo care clinic and identified patient specific risk factors. I discussed that patient was identified as high risk primarily based on:  Patient has past medical history positive for: Past Medical History:  Diagnosis Date   Acute respiratory failure with hypoxia s/p tracheostomy 07/20/2013   Anemia    a. 7-08/2013 felt  due to recent critical illness/chronic disease.   Arthritis    handds, knees & back    Barrett's esophagus    CAD (coronary artery disease)    a. Mod dz 2010 initially mgd medically. b. 12/2012 - angina s/p PTCA/DES to mid-circumflex, PTCA/DES to first OM.    Carotid artery disease ([place])    a. 60-70% bilat ICA stenosis by dopplers 05/2012.   Chronic back pain    deteriorating;DDD   Colonic obstruction due to suspected colitis; s/p colectomy/colostomy    a. See SBO.   Complication of anesthesia    hard to wake up from anesthesia    COPD (chronic obstructive pulmonary disease) (HCC)    Depression    takes Remeron and Zoloft daily   Dizziness    was on Bentyl which caused this-took off of it and no problems since   DVT of axillary vein, acute right ([place])    a. Dx 07/2013 felt due to R IJ central line that had been inserted 7/14. Not on  anticoag due to GIB issues and small cerebral bleed.   GERD (gastroesophageal reflux disease)    Heart murmur    History of bronchitis    > 54yrs ago    History of colon polyps    History of hiatal hernia    History of kidney stones    was told it was stable but doesn't know for sure that she ever passed it   HTN (hypertension)    Hypertension    takes Metoprolol daily   Intracranial hemorrhage ([place])    a. 08/11/13 - per DC summary, tiny SAH vs SDH done for altered mentation, anticoag stopped including aspirin.   Joint pain    Neck pain    DDD   Other and unspecified hyperlipidemia    takes Lipitor daily   Pneumonia    hx of->15 yrs ago   Protein-calorie malnutrition, severe w/ electrolyte imbalance    a. Severe hypoalbuminemia leading to 3rd spacing including anasarca and pulm edema 07/2013.   SBO (small bowel obstruction) ([place])    a. Lysis of adhesions and ovarian cystectomy 04/2013 for sBO. b. Admitted 07/2013 for colonic obstruction due to suspected colitis, s/p partial colectomy/colostomy 08/01/13. Admission complicated by anasarca, acute resp failure requiring tracheostomy, decannulated 08/25/13. c. Recurrent SBO8/2015, NGT placed for decompression.    Stroke [facility]) early 80's   right sided weakness--TIA    Patient has past surgical history positive for: Past Surgical History:  Procedure Laterality Date   ABDOMINAL HYSTERECTOMY     APPENDECTOMY     BOWEL RESECTION N/A 08/01/2013   Procedure: SMALL BOWEL RESECTION;  Surgeon: Harl Bowie, MD;  Location: [place];  Service: General;  Laterality: N/A;   BRONCHIAL BIOPSY  07/02/2020   Procedure: BRONCHIAL BIOPSIES;  Surgeon: Collene Gobble, MD;  Location: [facility] ENDOSCOPY;  Service: Pulmonary;;   BRONCHIAL BRUSHINGS  07/02/2020   Procedure: BRONCHIAL BRUSHINGS;  Surgeon: Collene Gobble, MD;  Location: [facility] ENDOSCOPY;  Service: Pulmonary;;   BRONCHIAL NEEDLE ASPIRATION BIOPSY  07/02/2020   Procedure: BRONCHIAL NEEDLE ASPIRATION BIOPSIES;   Surgeon: Collene Gobble, MD;  Location: [place];  Service: Pulmonary;;   BRONCHIAL WASHINGS  07/02/2020   Procedure: BRONCHIAL WASHINGS;  Surgeon: Collene Gobble, MD;  Location: [facility] ENDOSCOPY;  Service: Pulmonary;;   cataract surgery Bilateral    CHOLECYSTECTOMY     COLON RESECTION     COLON SURGERY     COLOSTOMY N/A 08/01/2013   Procedure: COLOSTOMY;  Surgeon: Harl Bowie, MD;  Location: [employer];  Service: General;  Laterality: N/A;   COLOSTOMY TAKEDOWN  01/24/2014   dr blackman   COLOSTOMY TAKEDOWN N/A 01/24/2014   Procedure: COLOSTOMY TAKEDOWN;  Surgeon: Coralie Keens, MD;  Location: [place];  Service: General;  Laterality: N/A;   CORONARY ANGIOPLASTY WITH STENT PLACEMENT  12/20/2012   STENT TO OM         DR COOPER   ENDARTERECTOMY Right 08/08/2014   Procedure: RIGHT CAROTID ENDARTERECTOMY WITH HEMASHIELD PATCH ANGIOPLASTY;  Surgeon: Elam Dutch, MD;  Location: [place];  Service: Vascular;  Laterality: Right;   FIDUCIAL MARKER PLACEMENT  07/02/2020   Procedure: FIDUCIAL MARKER PLACEMENT;  Surgeon: Collene Gobble, MD;  Location: [place] ENDOSCOPY;  Service: Pulmonary;;   FOOT SURGERY Left    HAND SURGERY Bilateral    for ganglion cysts   HEMOSTASIS CONTROL  07/02/2020   Procedure: HEMOSTASIS CONTROL;  Surgeon: Collene Gobble, MD;  Location: [place] ENDOSCOPY;  Service: Pulmonary;;  ice saline and epi   IR IMAGING GUIDED PORT INSERTION  08/23/2020   LAPAROTOMY N/A 08/01/2013   Procedure: EXPLORATORY LAPAROTOMY;  Surgeon: Harl Bowie, MD;  Location: [place];  Service: General;  Laterality: N/A;   LEFT HEART CATH AND CORONARY ANGIOGRAPHY N/A 01/10/2019   Procedure: LEFT HEART CATH AND CORONARY ANGIOGRAPHY;  Surgeon: Troy Sine, MD;  Location: [place] CV LAB;  Service: Cardiovascular;  Laterality: N/A;   LEFT HEART CATHETERIZATION WITH CORONARY ANGIOGRAM N/A 12/20/2012   Procedure: LEFT HEART CATHETERIZATION WITH CORONARY ANGIOGRAM;  Surgeon: Blane Ohara, MD;  Location: [facility]  CATH LAB;  Service: Cardiovascular;  Laterality: N/A;   PARTIAL COLECTOMY N/A 08/01/2013   Procedure: PARTIAL COLECTOMY;  Surgeon: Harl Bowie, MD;  Location: [place];  Service: General;  Laterality: N/A;   ROTATOR CUFF REPAIR Left    TONSILLECTOMY     TRACHEOSTOMY     feinstein   VIDEO BRONCHOSCOPY WITH ENDOBRONCHIAL NAVIGATION N/A 07/02/2020   Procedure: VIDEO BRONCHOSCOPY WITH ENDOBRONCHIAL NAVIGATION;  Surgeon: Collene Gobble, MD;  Location: [place] ENDOSCOPY;  Service: Pulmonary;  Laterality: N/A;   The patient is a with newly diagnosed lung cancer.  Patient presents to clinic today with her husband for chemotherapy education and palliative care consult.  We will start durvalumab.  We will send in prescriptions for prochlorperazine and ondansetron.  The patient verbalizes understanding of and agreement to the plan as discussed today.  Provided general information including the following: 1.  Date of education: 10/29/2020 2.  Physician name: Dr. Hinton Rao 3.  Diagnosis: Lung cancer 4.  Stage: stage IIIB 5.  Curative 6.  Chemotherapy plan including drugs and how often: Durvalumab IV every 2 weeks 7.  Start date: 10/30/2020 8.  Other referrals: None at this time 9.  The patient is to call our office with any questions or concerns.  Our office number [phone], if after hours or on the weekend, call the same number and wait for the answering service.  There is always an oncologist on call 10.  Medications prescribed: ambien 11.  The patient has verbalized understanding of the treatment plan and has no barriers to adherence or understanding.  Obtained signed consent from patient.  Discussed symptoms including 1.  Low blood counts including red blood cells, white blood cells and platelets. 2. Infection including to avoid large crowds, wash hands frequently, and stay away from people who were sick.  If fever develops of 100.4 or higher,  call our office. 3.  Mucositis-given instructions on  mouth rinse (baking soda and salt mixture).  Keep mouth clean.  Use soft bristle toothbrush.  If mouth sores develop, call our clinic. 4.  Nausea/vomiting-gave prescriptions for ondansetron 4 mg every 4 hours as needed for nausea, may take around the clock if persistent.  Compazine 10 mg every 6 hours, may take around the clock if persistent. 5.  Diarrhea-use over-the-counter Imodium.  Call clinic if not controlled. 6.  Constipation-use senna, 1 to 2 tablets twice a day.  If no BM in 2 to 3 days call the clinic. 7.  Loss of appetite-try to eat small meals every 2-3 hours.  Call clinic if not eating. 8.  Taste changes-zinc 500 mg daily.  If becomes severe call clinic. 9.  Alcoholic beverages. 10.  Drink 2 to 3 quarts of water per day. 11.  Peripheral neuropathy-patient to call if numbness or tingling in hands or feet is persistent  Neulasta-will be given 24 to 48 hours after chemotherapy.  Gave information sheet on bone and joint pain.  Use Claritin or Pepcid.  May use ibuprofen or Aleve.  Call if symptoms persist or are unbearable.   Answered questions to patient satisfaction.  Patient is to call with any further questions or concerns.  Time spent on this palliative care/chemotherapy education was 60 minutes with more than 50% spent discussing diagnosis, prognosis and symptom management.  The medication prescribed to the patient will be printed out from [facility] references This will give the following information: Name of your medication Approved uses Dose and schedule Storage and handling Handling body fluids and waste Drug and food interactions Possible side effects and management Pregnancy, sexual activity, and contraception Obtaining medication   We discussed that social determinants of health may have significant impacts on health and outcomes for cancer patients.  Today we discussed specific social determinants of performance status, alcohol use, depression, financial needs, food  insecurity, housing, interpersonal violence, social connections, stress, tobacco use, and transportation.    After lengthy discussion the following were identified as areas of need:   Outpatient services: We discussed options including home based and outpatient services, DME and care program. We discusssed that patients who participate in regular physical activity report fewer negative impacts of cancer and treatments and report less fatigue.   Financial Concerns: We discussed that living with cancer can create tremendous financial burden.  We discussed options for assistance. I asked that if assistance is needed in affording medications or paying bills to please let us know so that we can provide assistance. We discussed options for food including social services, [employer] ($50 every 2 weeks) and onsite food pantry.  We will also notify [facility] to see if cancer center can provide additional support.  Referral to Social work: Introduced Education officer, museum Elease Etienne and the services he can provide such as support with utility bill, cell phone and gas vouchers.   Support groups: We discussed options for support groups at the cancer center. If interested, please notify nurse navigator to enroll. We discussed options for managing stress including healthy eating, exercise as well as participating in no charge counseling services at the cancer center and support groups.  If these are of interest, patient can notify either myself or primary nursing team.We discussed options for management including medications and referral to quit Smart program  Transportation: We discussed options for transportation including [employer], paratransit, bus routes, link transit, taxi/uber/lyft, and cancer center van.  I  have notified primary oncology team who will help assist with arranging Lucianne Lei transportation for appointments when/if needed. We also discussed options for transportation on short notice/acute  visits.  Palliative care services: We have palliative care services available in the cancer center to discuss goals of care and advanced care planning.  Please let us know if you have any questions or would like to speak to our palliative nurse practitioner.  Symptom Management Clinic: We discussed our symptom management clinic which is available for acute concerns while receiving treatment such as nausea, vomiting or diarrhea.  We can be reached via telephone at [phone] or through my chart.  We are available for virtual or in person visits on the same day from 830 to 4 PM Monday through Friday. She denies needing specific assistance at this time and She will be followed by Dr. Hinton Rao clinical team.  Plan: Discussed symptom management clinic. Discussed palliative care services. Discussed resources that are available here at the cancer center. Discussed medications and new prescriptions to begin treatment such as anti-nausea or steroids.   Disposition: RTC on   Visit Diagnosis No diagnosis found.  Patient expressed understanding and was in agreement with this plan. She also understands that She can call clinic at any time with any questions, concerns, or complaints.   I provided 30 minutes face to face during this encounter, and > 50% was spent counseling as documented under my assessment & plan.   Altha Harm, PhD, NP-C [place] at [facility] [phone]

## 2020-10-29 NOTE — Progress Notes (Signed)
..  Pharmacist Chemotherapy Monitoring - Initial Assessment    Anticipated start date: 10/30/20  The following has been reviewed per standard work regarding the patient's treatment regimen: The patient's diagnosis, treatment plan and drug doses, and organ/hematologic function Lab orders and baseline tests specific to treatment regimen  The treatment plan start date, drug sequencing, and pre-medications Prior authorization status  Patient's documented medication list, including drug-drug interaction screen and prescriptions for anti-emetics and supportive care specific to the treatment regimen The drug concentrations, fluid compatibility, administration routes, and timing of the medications to be used The patient's access for treatment and lifetime cumulative dose history, if applicable  The patient's medication allergies and previous infusion related reactions, if applicable   Changes made to treatment plan:  N/A  Follow up needed:  N/A   Juanetta Beets, [facility], 10/29/2020  4:05 PM

## 2020-10-29 NOTE — Telephone Encounter (Signed)
Chemo [employer]

## 2020-10-29 NOTE — Patient Instructions (Signed)
Medication Instructions:  Continue your current medications.   *If you need a refill on your cardiac medications before your next appointment, please call your pharmacy*   Lab Work: None ordered today.   Testing/Procedures: None ordered today.   Follow-Up: At [facility], you and your health needs are our priority.  As part of our continuing mission to provide you with exceptional heart care, we have created designated Provider Care Teams.  These Care Teams include your primary Cardiologist (physician) and Advanced Practice Providers (APPs -  Physician Assistants and Nurse Practitioners) who all work together to provide you with the care you need, when you need it.  We recommend signing up for the patient portal called "MyChart".  Sign up information is provided on this After Visit Summary.  MyChart is used to connect with patients for Virtual Visits (Telemedicine).  Patients are able to view lab/test results, encounter notes, upcoming appointments, etc.  Non-urgent messages can be sent to your provider as well.   To learn more about what you can do with MyChart, go to [email or web address].    Your next appointment:   6 month(s)  The format for your next appointment:   In Person  Provider:   You may see Freada Bergeron, MD or one of the following Advanced Practice Providers on your designated Care Team:   Richardson Dopp, PA-C [place], [place] Loel Dubonnet, NP    Other Instructions  Heart Healthy Diet Recommendations: A low-salt diet is recommended. Meats should be grilled, baked, or boiled. Avoid fried foods. Focus on lean protein sources like fish or chicken with vegetables and fruits. The American Heart Association is a Microbiologist!   Exercise recommendations: The American Heart Association recommends 150 minutes of moderate intensity exercise weekly. Try 30 minutes of moderate intensity exercise 4-5 times per week. This could include walking, jogging, or  swimming.

## 2020-10-29 NOTE — Progress Notes (Signed)
  Radiation Oncology         [phone]) [phone] ________________________________  Name: [name] MRN: [id number]  Date of Service: 10/29/2020  DOB: 1940-12-27  Post Treatment Telephone Note  Diagnosis:   Stage III non-small cell lung cancer with involvement of the right lower lobe, right hilum, and mediastinum.  Interval Since Last Radiation:  5 weeks   08/02/2020 through 09/25/2020 Site Technique Total Dose (Gy) Dose per Fx (Gy) Completed Fx Beam Energies  Lung, Right: Lung_Rt IMRT 60/60 2 30/30 6X    Narrative:  The patient was contacted today for routine follow-up. During treatment she did very well with radiotherapy and did not have significant desquamation but had fatigue and esophagitis. She reports she is doing well and back to eating regular foods. Her energy is getting better as well and she has follow up soon with Dr. Hinton Rao.  Impression/Plan: 1. Stage III non-small cell lung cancer with involvement of the right lower lobe, right hilum, and mediastinum.. The patient has been doing well since completion of radiotherapy. We discussed that we would be happy to continue to follow her as needed, but she will also continue to follow up with Dr. Hinton Rao in medical oncology.         Carola Rhine, PAC

## 2020-10-29 NOTE — Progress Notes (Signed)
Office Visit    Patient Name: [name] Date of Encounter: 10/31/2020  PCP:  Lowella Dandy, NP   [place]  Cardiologist:  Freada Bergeron, MD  Advanced Practice Provider:  No care team member to display Electrophysiologist:  Will Meredith Leeds, MD      Chief Complaint    [name] is a 80 y.o. female with a hx of CAD s/p PCI to Cx 2014 and NSTEMI 12/2018, hypertension, hyperlipidemia, chronic diastolic heart failure, tobacco use, PAD, CVA, carotid artery disease s/p right carotid endarterectomy 07/2014 presents today for follow up of CAD   Past Medical History    Past Medical History:  Diagnosis Date   Acute respiratory failure with hypoxia s/p tracheostomy 07/20/2013   Anemia    a. 7-08/2013 felt due to recent critical illness/chronic disease.   Arthritis    handds, knees & back    Barrett's esophagus    CAD (coronary artery disease)    a. Mod dz 2010 initially mgd medically. b. 12/2012 - angina s/p PTCA/DES to mid-circumflex, PTCA/DES to first OM.    Carotid artery disease ([place])    a. 60-70% bilat ICA stenosis by dopplers 05/2012.   Chronic back pain    deteriorating;DDD   Colonic obstruction due to suspected colitis; s/p colectomy/colostomy    a. See SBO.   Complication of anesthesia    hard to wake up from anesthesia    COPD (chronic obstructive pulmonary disease) (HCC)    Depression    takes Remeron and Zoloft daily   Dizziness    was on Bentyl which caused this-took off of it and no problems since   DVT of axillary vein, acute right ([place])    a. Dx 07/2013 felt due to R IJ central line that had been inserted 7/14. Not on anticoag due to GIB issues and small cerebral bleed.   GERD (gastroesophageal reflux disease)    Heart murmur    History of bronchitis    > 6yrs ago    History of colon polyps    History of hiatal hernia    History of kidney stones    was told it was stable but doesn't know for sure that she  ever passed it   HTN (hypertension)    Hypertension    takes Metoprolol daily   Intracranial hemorrhage ([place])    a. 08/11/13 - per DC summary, tiny SAH vs SDH done for altered mentation, anticoag stopped including aspirin.   Joint pain    Neck pain    DDD   Other and unspecified hyperlipidemia    takes Lipitor daily   Pneumonia    hx of->15 yrs ago   Protein-calorie malnutrition, severe w/ electrolyte imbalance    a. Severe hypoalbuminemia leading to 3rd spacing including anasarca and pulm edema 07/2013.   SBO (small bowel obstruction) ([place])    a. Lysis of adhesions and ovarian cystectomy 04/2013 for sBO. b. Admitted 07/2013 for colonic obstruction due to suspected colitis, s/p partial colectomy/colostomy 08/01/13. Admission complicated by anasarca, acute resp failure requiring tracheostomy, decannulated 08/25/13. c. Recurrent SBO8/2015, NGT placed for decompression.    Stroke [facility]) early 80's   right sided weakness--TIA   Past Surgical History:  Procedure Laterality Date   ABDOMINAL HYSTERECTOMY     APPENDECTOMY     BOWEL RESECTION N/A 08/01/2013   Procedure: SMALL BOWEL RESECTION;  Surgeon: Harl Bowie, MD;  Location: [place];  Service: General;  Laterality:  N/A;   BRONCHIAL BIOPSY  07/02/2020   Procedure: BRONCHIAL BIOPSIES;  Surgeon: Collene Gobble, MD;  Location: [facility] ENDOSCOPY;  Service: Pulmonary;;   BRONCHIAL BRUSHINGS  07/02/2020   Procedure: BRONCHIAL BRUSHINGS;  Surgeon: Collene Gobble, MD;  Location[facility] ENDOSCOPY;  Service: Pulmonary;;   BRONCHIAL NEEDLE ASPIRATION BIOPSY  07/02/2020   Procedure: BRONCHIAL NEEDLE ASPIRATION BIOPSIES;  Surgeon: Collene Gobble, MD;  Location: [facility] ENDOSCOPY;  Service: Pulmonary;;   BRONCHIAL WASHINGS  07/02/2020   Procedure: BRONCHIAL WASHINGS;  Surgeon: Collene Gobble, MD;  Location: [place] ENDOSCOPY;  Service: Pulmonary;;   cataract surgery Bilateral    CHOLECYSTECTOMY     COLON RESECTION     COLON SURGERY     COLOSTOMY N/A 08/01/2013    Procedure: COLOSTOMY;  Surgeon: Harl Bowie, MD;  Location: [place];  Service: General;  Laterality: N/A;   COLOSTOMY TAKEDOWN  01/24/2014   dr blackman   COLOSTOMY TAKEDOWN N/A 01/24/2014   Procedure: COLOSTOMY TAKEDOWN;  Surgeon: Coralie Keens, MD;  Location: [place];  Service: General;  Laterality: N/A;   CORONARY ANGIOPLASTY WITH STENT PLACEMENT  12/20/2012   STENT TO OM         DR COOPER   ENDARTERECTOMY Right 08/08/2014   Procedure: RIGHT CAROTID ENDARTERECTOMY WITH HEMASHIELD PATCH ANGIOPLASTY;  Surgeon: Elam Dutch, MD;  Location: [place];  Service: Vascular;  Laterality: Right;   FIDUCIAL MARKER PLACEMENT  07/02/2020   Procedure: FIDUCIAL MARKER PLACEMENT;  Surgeon: Collene Gobble, MD;  Location: [place] ENDOSCOPY;  Service: Pulmonary;;   FOOT SURGERY Left    HAND SURGERY Bilateral    for ganglion cysts   HEMOSTASIS CONTROL  07/02/2020   Procedure: HEMOSTASIS CONTROL;  Surgeon: Collene Gobble, MD;  Location: [place] ENDOSCOPY;  Service: Pulmonary;;  ice saline and epi   IR IMAGING GUIDED PORT INSERTION  08/23/2020   LAPAROTOMY N/A 08/01/2013   Procedure: EXPLORATORY LAPAROTOMY;  Surgeon: Harl Bowie, MD;  Location: [place];  Service: General;  Laterality: N/A;   LEFT HEART CATH AND CORONARY ANGIOGRAPHY N/A 01/10/2019   Procedure: LEFT HEART CATH AND CORONARY ANGIOGRAPHY;  Surgeon: Troy Sine, MD;  Location: [place] CV LAB;  Service: Cardiovascular;  Laterality: N/A;   LEFT HEART CATHETERIZATION WITH CORONARY ANGIOGRAM N/A 12/20/2012   Procedure: LEFT HEART CATHETERIZATION WITH CORONARY ANGIOGRAM;  Surgeon: Blane Ohara, MD;  Location: [facility] CATH LAB;  Service: Cardiovascular;  Laterality: N/A;   PARTIAL COLECTOMY N/A 08/01/2013   Procedure: PARTIAL COLECTOMY;  Surgeon: Harl Bowie, MD;  Location: [place];  Service: General;  Laterality: N/A;   ROTATOR CUFF REPAIR Left    TONSILLECTOMY     TRACHEOSTOMY     [place]   VIDEO BRONCHOSCOPY WITH ENDOBRONCHIAL NAVIGATION  N/A 07/02/2020   Procedure: VIDEO BRONCHOSCOPY WITH ENDOBRONCHIAL NAVIGATION;  Surgeon: Collene Gobble, MD;  Location: [place] ENDOSCOPY;  Service: Pulmonary;  Laterality: N/A;    Allergies  Allergies  Allergen Reactions   Pregabalin Swelling    Tongue swelling   Paclitaxel    Sulfonamide Derivatives Swelling    Childhood reaction    History of Present Illness    CAMBRIA OSTEN is a 80 y.o. female with a hx of CAD s/p PCI to Cx 2014 and NSTEMI 12/2018, hypertension, hyperlipidemia, chronic diastolic heart failure, tobacco use, PAD, CVA, carotid artery disease s/p right carotid endarterectomy 07/2014  last seen 05/11/2020 by Dr. Johney Frame.  She underwent cardiac catheterization December 2014 with finding of  severe stenosis in OM1 treated with DES X2.  May 2015 had SBO and required partial colon resection.  She underwent right carotid endarterectomy July 5102 complicated by postoperative stroke in the recovery room.  This resulted in left hemiaplasia.  Hospitalist December 2020 with retrosternal chest pressure diagnosed with NSTEMI and high-sensitivity troponin up to 23,000.  She underwent cardiac catheterization showing distal total occlusion of obtuse marginal branch of left circumflex coronary artery.  There was faint collateralization from LAD.  LVEF 55 to 60%.  No PCI performed and medical management recommended.  Last seen 05/11/2020 she was doing overall well from a cardiac perspective they recommended for CT chest due to a long history of tobacco use.  CT chest 05/2020 with new highly suspicious spiculated pulmonary nodule within the superior lateral aspect of right lower lobe measuring 1.5X1X 1.4 cm with small central cavitation almost certainly a neoplastic nodule.  She has since established with Dr. Hinton Rao of oncology for stage IIIb squamous cell lung cancer having received concurrent chemoradiation and completed 6 cycles of carboplatin/Abraxane. Plan for durvalumab for 1 year.[place]  She was  hospitalized in August due to COVID-19.  She presents today for follow up with her spouse. Endorses feeling overall well since last seen. Glad to be done with chemotherapy and radiation and grateful for her oncology team. Reports no shortness of breath nor dyspnea on exertion. Reports no chest pain, pressure, or tightness. No edema, orthopnea, PND. Reports no palpitations.    EKGs/Labs/Other Studies Reviewed:   The following studies were reviewed today: Cath 01/10/19:     Previously placed Mid Cx to Dist Cx stent (unknown type) is widely patent. Mid Cx lesion is 30% stenosed. 2nd Mrg-1 lesion is 40% stenosed. Previously placed 2nd Mrg-2 stent (unknown type) is widely patent. 2nd Mrg-3 lesion is 100% stenosed. Prox RCA lesion is 30% stenosed. The left ventricular systolic function is normal. LV end diastolic pressure is mildly elevated.   Non-ST segment elevation myocardial infarction secondary to distal total occlusion of the obtuse marginal branch of the left circumflex coronary artery,   Normal left main, LAD and mild nonobstructive 30% narrowing in the RCA which supplies the posterior wall.   The left circumflex vessel has 40% ostial stenosis in the OM 2 vessel with a widely patent stent in the midportion of this marginal branch.  The distal marginal is 100% occluded.  There is faint collateralization from the LAD to the distal vessel which is small caliber; the AV groove circumflex has 30% narrowing in the stent in the AV groove circumflex is widely patent.   Serve global LV function with EF estimated 55%.  There is a subtle focal area of distal inferior hypocontractility.   RECOMMENDATION: The patient's ECG is entirely normal.  She is not having any recurrent chest pain.  Her MI was over 48 hours ago.  With her stability and distal occlusion recommend medical management.  Consider DAPT; however, will need to obtain additional information with her history of prior stroke following  carotid endarterectomy remotely.  Alternatively aspirin alone and medical therapy for her current obstructive disease.  Smoking cessation is essential.  Aggressive lipid-lowering therapy with target LDL less than 70.   TTE 01/09/19: IMPRESSIONS     1. Left ventricular ejection fraction, by visual estimation, is 60 to  65%. The left ventricle has normal function. There is borderline left  ventricular hypertrophy.   2. Left ventricular diastolic parameters are consistent with Grade II  diastolic dysfunction (pseudonormalization).  3. The left ventricle has no regional wall motion abnormalities.   4. Global right ventricle was not well visualized.The right ventricular  size is normal. No increase in right ventricular wall thickness.   5. Left atrial size was mild-moderately dilated.   6. Right atrial size was normal.   7. Presence of pericardial fat pad.   8. Small pericardial effusion.   9. The mitral valve is rheumatic. Trivial mitral valve regurgitation.  Mild mitral stenosis.  10. The tricuspid valve is normal in structure. Tricuspid valve  regurgitation is trivial.  11. The aortic valve is tricuspid. Aortic valve regurgitation is not  visualized. No evidence of aortic valve sclerosis or stenosis.  12. Pulmonic regurgitation is mild.  13. The pulmonic valve was grossly normal. Pulmonic valve regurgitation is  mild.    Zio [id number] 10/2018: Sinus rhythm to sinus tachycardia. Frequent nsVTs with maximum rate 177 BPM, the longest lasting 12 seconds. Frequent short runs of SVTs.   Sinus rhythm to sinus tachycardia. Frequent nsVTs with maximum rate 177 BPM, the longest lasting 12 seconds.   Patients symptoms didn't correlate with significant arrhythmias. Echocardiogram and referral to EP is recommended.  EKG:  No EKG today  Recent Labs: 08/17/2020: Magnesium 1.8 10/25/2020: ALT 14; BUN 17; Creatinine 0.8; Hemoglobin 10.0; Platelets 136; Potassium 3.8; Sodium 142; TSH 0.76  Recent  Lipid Panel    Component Value Date/Time   CHOL 117 01/09/2019 0053   CHOL 150 01/30/2016 1052   TRIG 86 01/09/2019 0053   HDL 42 01/09/2019 0053   HDL 38 (L) 01/30/2016 1052   CHOLHDL 2.8 01/09/2019 0053   VLDL 17 01/09/2019 0053   LDLCALC 58 01/09/2019 0053   LDLCALC 84 01/30/2016 1052   Home Medications   Current Meds  Medication Sig   amoxicillin-clavulanate (AUGMENTIN) 875-125 MG tablet Take 1 tablet by mouth 2 (two) times daily for 10 days.   aspirin EC 81 MG tablet Take 2 tablets (162 mg total) by mouth in the morning. Okay to restart this medication on 07/04/2020   atorvastatin (LIPITOR) 80 MG tablet Take 80 mg by mouth every evening.    buPROPion (WELLBUTRIN XL) 150 MG 24 hr tablet Take 1 tablet (150 mg total) by mouth daily.   fish oil-omega-3 fatty acids 1000 MG capsule Take 1,000 mg by mouth in the morning.   fluticasone (FLONASE) 50 MCG/ACT nasal spray Place 1 spray into both nostrils daily as needed for allergies.   folic acid (FOLVITE) 308 MCG tablet Take 800 mcg by mouth in the morning.   HYDROcodone-acetaminophen (HYCET) 7.5-325 mg/15 ml solution Take 10 mLs by mouth 4 (four) times daily as needed for moderate pain.   metoprolol succinate (TOPROL-XL) 50 MG 24 hr tablet TAKE 1 TABLET BY MOUTH  DAILY   mirtazapine (REMERON) 45 MG tablet Take 45 mg by mouth at bedtime.    nitroGLYCERIN (NITROSTAT) 0.4 MG SL tablet DISSOLVE 1 TABLET UNDER THE TONGUE EVERY 5 MINUTES AS  NEEDED FOR CHEST PAIN. MAX  OF 3 TABLETS IN 15 MINUTES. CALL 911 IF PAIN PERSISTS.   ondansetron (ZOFRAN) 4 MG tablet Take 4 mg by mouth every 8 (eight) hours as needed for nausea or vomiting.   pantoprazole (PROTONIX) 40 MG tablet Take 40 mg by mouth in the morning.   sertraline (ZOLOFT) 100 MG tablet Take 200 mg by mouth in the morning.   zolpidem (AMBIEN) 5 MG tablet Take 1 tablet (5 mg total) by mouth at bedtime as needed for sleep.  Review of Systems      All other systems reviewed and are  otherwise negative except as noted above.  Physical Exam    VS:  BP (!) 112/50 (BP Location: Right Arm, Patient Position: Sitting, Cuff Size: Normal)   Pulse 70   Ht 4\' 11"  (1.499 m)   Wt 144 lb 6.4 oz (65.5 kg)   SpO2 96%   BMI 29.17 kg/m  , BMI Body mass index is 29.17 kg/m.  Wt Readings from Last 3 Encounters:  10/30/20 145 lb 1.4 oz (65.8 kg)  10/29/20 144 lb 6.4 oz (65.5 kg)  10/29/20 144 lb 3.2 oz (65.4 kg)     GEN: Well nourished, well developed, in no acute distress. HEENT: normal. Neck: Supple, no JVD, carotid bruits, or masses. Cardiac: RRR, no murmurs, rubs, or gallops. No clubbing, cyanosis, edema.  Radials/PT 2+ and equal bilaterally.  Respiratory:  Respirations regular and unlabored, clear to auscultation bilaterally. GI: Soft, nontender, nondistended. MS: No deformity or atrophy. Skin: Warm and dry, no rash. Neuro:  Strength and sensation are intact. Psych: Normal affect.  Assessment & Plan    CAD s/p PCI Cx - Stable with no anginal symptoms. No indication for ischemic evaluation. GDMT includes  Metoprolol, aspirin, atorvastatin, PRN nitroglycerin. Heart healthy diet and regular cardiovascular exercise encouraged.   Chronic diastolic heart failure - Euvolemic and well compensated on exam. GDMT includes metoprolol. No indication for loop diuretic at this time. Low salt diet and fluid restriction <2L encouraged.  Carotid endarterectomy s/p right carotid endarterectomy - Follows with [facility]. Continue aspirin, statin.   History of CVA - Continue aspirin, statin and secondary prevention.  Hyperlipidemia - Continue Atorvastatin 80mg  QD. Denies myalgias.   Hypertension- BP well controlled. Continue current antihypertensive regimen.  Heart healthy diet and regular cardiovascular exercise encouraged.    Lung Cancer - Follows with oncology.   Disposition: Follow up in 6 month(s) with Dr. Johney Frame or APP.  Signed, Loel Dubonnet, NP 10/31/2020, 6:51 PM [place]

## 2020-10-30 ENCOUNTER — Inpatient Hospital Stay: Payer: Medicare Other

## 2020-10-30 VITALS — BP 132/59 | HR 67 | Temp 98.2°F | Resp 20 | Ht 59.0 in | Wt 145.1 lb

## 2020-10-30 DIAGNOSIS — K219 Gastro-esophageal reflux disease without esophagitis: Secondary | ICD-10-CM | POA: Diagnosis not present

## 2020-10-30 DIAGNOSIS — Z5112 Encounter for antineoplastic immunotherapy: Secondary | ICD-10-CM | POA: Diagnosis not present

## 2020-10-30 DIAGNOSIS — Z452 Encounter for adjustment and management of vascular access device: Secondary | ICD-10-CM | POA: Diagnosis not present

## 2020-10-30 DIAGNOSIS — M8580 Other specified disorders of bone density and structure, unspecified site: Secondary | ICD-10-CM | POA: Diagnosis not present

## 2020-10-30 DIAGNOSIS — R5383 Other fatigue: Secondary | ICD-10-CM | POA: Diagnosis not present

## 2020-10-30 DIAGNOSIS — D6481 Anemia due to antineoplastic chemotherapy: Secondary | ICD-10-CM | POA: Diagnosis not present

## 2020-10-30 DIAGNOSIS — C3431 Malignant neoplasm of lower lobe, right bronchus or lung: Secondary | ICD-10-CM

## 2020-10-30 MED ORDER — SODIUM CHLORIDE 0.9 % IV SOLN
10.0000 mg/kg | Freq: Once | INTRAVENOUS | Status: AC
  Administered 2020-10-30: 620 mg via INTRAVENOUS
  Filled 2020-10-30: qty 2.4

## 2020-10-30 MED ORDER — SODIUM CHLORIDE 0.9% FLUSH
10.0000 mL | INTRAVENOUS | Status: DC | PRN
  Administered 2020-10-30: 10 mL

## 2020-10-30 MED ORDER — SODIUM CHLORIDE 0.9 % IV SOLN: Freq: Once | INTRAVENOUS | Status: AC

## 2020-10-30 MED ORDER — HEPARIN SOD (PORK) LOCK FLUSH 100 UNIT/ML IV SOLN
500.0000 [IU] | Freq: Once | INTRAVENOUS | Status: AC | PRN
  Administered 2020-10-30: 500 [IU]

## 2020-10-30 NOTE — Progress Notes (Signed)
Discharged home, stable  

## 2020-10-30 NOTE — Patient Instructions (Signed)
[facility]  Discharge Instructions: Thank you for choosing [facility] to provide your oncology and hematology care.  If you have a lab appointment with the [facility], please go directly to the [facility] and check in at the registration area.   Wear comfortable clothing and clothing appropriate for easy access to any Portacath or PICC line.   We strive to give you quality time with your provider. You may need to reschedule your appointment if you arrive late (15 or more minutes).  Arriving late affects you and other patients whose appointments are after yours.  Also, if you miss three or more appointments without notifying the office, you may be dismissed from the clinic at the provider's discretion.      For prescription refill requests, have your pharmacy contact our office and allow 72 hours for refills to be completed.    Today you received the following chemotherapy and/or immunotherapy agents:  Durvalumab injection What is this medication? DURVALUMAB (dur VAL ue mab) is a monoclonal antibody. It is used to treat lung cancer. This medicine may be used for other purposes; ask your health care provider or pharmacist if you have questions. COMMON BRAND NAME(S): IMFINZI What should I tell my care team before I take this medication? They need to know if you have any of these conditions: autoimmune diseases like Crohn's disease, ulcerative colitis, or lupus have had or planning to have an allogeneic stem cell transplant (uses someone else's stem cells) history of organ transplant history of radiation to the chest nervous system problems like myasthenia gravis or Guillain-Barre syndrome an unusual or allergic reaction to durvalumab, other medicines, foods, dyes, or preservatives pregnant or trying to get pregnant breast-feeding How should I use this medication? This medicine is for infusion into a vein. It is given by a health care  professional in a hospital or clinic setting. A special MedGuide will be given to you before each treatment. Be sure to read this information carefully each time. Talk to your pediatrician regarding the use of this medicine in children. Special care may be needed. Overdosage: If you think you have taken too much of this medicine contact a poison control center or emergency room at once. NOTE: This medicine is only for you. Do not share this medicine with others. What if I miss a dose? It is important not to miss your dose. Call your doctor or health care professional if you are unable to keep an appointment. What may interact with this medication? Interactions have not been studied. This list may not describe all possible interactions. Give your health care provider a list of all the medicines, herbs, non-prescription drugs, or dietary supplements you use. Also tell them if you smoke, drink alcohol, or use illegal drugs. Some items may interact with your medicine. What should I watch for while using this medication? This drug may make you feel generally unwell. Continue your course of treatment even though you feel ill unless your doctor tells you to stop. You may need blood work done while you are taking this medicine. Do not become pregnant while taking this medicine or for 3 months after stopping it. Women should inform their doctor if they wish to become pregnant or think they might be pregnant. There is a potential for serious side effects to an unborn child. Talk to your health care professional or pharmacist for more information. Do not breast-feed an infant while taking this medicine or for 3  months after stopping it. What side effects may I notice from receiving this medication? Side effects that you should report to your doctor or health care professional as soon as possible: allergic reactions like skin rash, itching or hives, swelling of the face, lips, or tongue black, tarry  stools bloody or watery diarrhea breathing problems change in emotions or moods change in sex drive changes in vision chest pain or chest tightness chills confusion cough facial flushing fever headache signs and symptoms of high blood sugar such as dizziness; dry mouth; dry skin; fruity breath; nausea; stomach pain; increased hunger or thirst; increased urination signs and symptoms of liver injury like dark yellow or brown urine; general ill feeling or flu-like symptoms; light-colored stools; loss of appetite; nausea; right upper belly pain; unusually weak or tired; yellowing of the eyes or skin stomach pain trouble passing urine or change in the amount of urine weight gain or weight loss Side effects that usually do not require medical attention (report these to your doctor or health care professional if they continue or are bothersome): bone pain constipation loss of appetite muscle pain nausea swelling of the ankles, feet, hands tiredness This list may not describe all possible side effects. Call your doctor for medical advice about side effects. You may report side effects to FDA at 1-800-FDA-1088. Where should I keep my medication? This drug is given in a hospital or clinic and will not be stored at home. NOTE: This sheet is a summary. It may not cover all possible information. If you have questions about this medicine, talk to your doctor, pharmacist, or health care provider.  2022 Elsevier/Gold Standard (2019-03-17 13:01:29)    To help prevent nausea and vomiting after your treatment, we encourage you to take your nausea medication as directed.  BELOW ARE SYMPTOMS THAT SHOULD BE REPORTED IMMEDIATELY: *FEVER GREATER THAN 100.4 F (38 C) OR HIGHER *CHILLS OR SWEATING *NAUSEA AND VOMITING THAT IS NOT CONTROLLED WITH YOUR NAUSEA MEDICATION *UNUSUAL SHORTNESS OF BREATH *UNUSUAL BRUISING OR BLEEDING *URINARY PROBLEMS (pain or burning when urinating, or frequent  urination) *BOWEL PROBLEMS (unusual diarrhea, constipation, pain near the anus) TENDERNESS IN MOUTH AND THROAT WITH OR WITHOUT PRESENCE OF ULCERS (sore throat, sores in mouth, or a toothache) UNUSUAL RASH, SWELLING OR PAIN  UNUSUAL VAGINAL DISCHARGE OR ITCHING   Items with * indicate a potential emergency and should be followed up as soon as possible or go to the Emergency Department if any problems should occur.  Please show the CHEMOTHERAPY ALERT CARD or IMMUNOTHERAPY ALERT CARD at check-in to the Emergency Department and triage nurse.  Should you have questions after your visit or need to cancel or reschedule your appointment, please contact [facility]  Dept: [phone]  and follow the prompts.  Office hours are 8:00 a.m. to 4:30 p.m. Monday - Friday. Please note that voicemails left after 4:00 p.m. may not be returned until the following business day.  We are closed weekends and major holidays. You have access to a nurse at all times for urgent questions. Please call the main number to the clinic Dept: [phone] and follow the prompts.  For any non-urgent questions, you may also contact your provider using MyChart. We now offer e-Visits for anyone 44 and older to request care online for non-urgent symptoms. For details visit [email or web address].   Also download the MyChart app! Go to the app store, search "MyChart", open the app, select [facility], and log in with your MyChart username  and password.  Due to Covid, a mask is required upon entering the hospital/clinic. If you do not have a mask, one will be given to you upon arrival. For doctor visits, patients may have 1 support person aged 63 or older with them. For treatment visits, patients cannot have anyone with them due to current Covid guidelines and our immunocompromised population.

## 2020-10-31 ENCOUNTER — Encounter (HOSPITAL_BASED_OUTPATIENT_CLINIC_OR_DEPARTMENT_OTHER): Payer: Self-pay | Admitting: Family

## 2020-10-31 ENCOUNTER — Telehealth: Payer: Self-pay

## 2020-10-31 ENCOUNTER — Encounter: Payer: Self-pay | Admitting: Oncology

## 2020-10-31 NOTE — Telephone Encounter (Signed)
I spoke with pt. She states she is feeling fine. She denies N/V, skin reactions, diarrhea, & fever. I reviewed possible side effects that can occur with immunotherapy, including but not limited to, diarrhea, fatigue, infections, new or worsening cough, SOB, changes in color of urine & abdominal pain. I then reminded pt of the importance of calling us if temp is 100.4 or higher, day or night. She verbalized understanding.

## 2020-11-01 DIAGNOSIS — E785 Hyperlipidemia, unspecified: Secondary | ICD-10-CM | POA: Diagnosis not present

## 2020-11-01 DIAGNOSIS — I10 Essential (primary) hypertension: Secondary | ICD-10-CM | POA: Diagnosis not present

## 2020-11-01 DIAGNOSIS — G4700 Insomnia, unspecified: Secondary | ICD-10-CM | POA: Diagnosis not present

## 2020-11-01 DIAGNOSIS — D539 Nutritional anemia, unspecified: Secondary | ICD-10-CM | POA: Diagnosis not present

## 2020-11-01 DIAGNOSIS — C3491 Malignant neoplasm of unspecified part of right bronchus or lung: Secondary | ICD-10-CM | POA: Diagnosis not present

## 2020-11-01 DIAGNOSIS — I471 Supraventricular tachycardia: Secondary | ICD-10-CM | POA: Diagnosis not present

## 2020-11-01 DIAGNOSIS — Z23 Encounter for immunization: Secondary | ICD-10-CM | POA: Diagnosis not present

## 2020-11-05 ENCOUNTER — Other Ambulatory Visit: Payer: Self-pay | Admitting: Cardiovascular Disease

## 2020-11-05 ENCOUNTER — Other Ambulatory Visit: Payer: Self-pay | Admitting: Cardiology

## 2020-11-07 ENCOUNTER — Ambulatory Visit (HOSPITAL_BASED_OUTPATIENT_CLINIC_OR_DEPARTMENT_OTHER): Payer: Medicare Other | Admitting: Family

## 2020-11-12 ENCOUNTER — Other Ambulatory Visit: Payer: Medicare Other

## 2020-11-12 ENCOUNTER — Ambulatory Visit: Payer: Medicare Other | Admitting: Oncology

## 2020-11-12 MED FILL — Durvalumab Soln for IV Infusion 500 MG/10ML (50 MG/ML): INTRAVENOUS | Qty: 12.4 | Status: AC

## 2020-11-13 ENCOUNTER — Inpatient Hospital Stay: Payer: Medicare Other

## 2020-11-13 ENCOUNTER — Inpatient Hospital Stay (INDEPENDENT_AMBULATORY_CARE_PROVIDER_SITE_OTHER): Payer: Medicare Other | Admitting: Hematology and Oncology

## 2020-11-13 ENCOUNTER — Telehealth: Payer: Self-pay | Admitting: Hematology and Oncology

## 2020-11-13 ENCOUNTER — Other Ambulatory Visit: Payer: Self-pay | Admitting: Pharmacist

## 2020-11-13 ENCOUNTER — Encounter: Payer: Self-pay | Admitting: Hematology and Oncology

## 2020-11-13 ENCOUNTER — Other Ambulatory Visit: Payer: Self-pay

## 2020-11-13 ENCOUNTER — Ambulatory Visit: Payer: Medicare Other

## 2020-11-13 VITALS — BP 123/47 | HR 69 | Temp 99.0°F | Resp 18 | Ht 59.0 in | Wt 143.8 lb

## 2020-11-13 DIAGNOSIS — C3431 Malignant neoplasm of lower lobe, right bronchus or lung: Secondary | ICD-10-CM

## 2020-11-13 DIAGNOSIS — R5383 Other fatigue: Secondary | ICD-10-CM | POA: Diagnosis not present

## 2020-11-13 DIAGNOSIS — Z5112 Encounter for antineoplastic immunotherapy: Secondary | ICD-10-CM | POA: Diagnosis not present

## 2020-11-13 DIAGNOSIS — Z95828 Presence of other vascular implants and grafts: Secondary | ICD-10-CM | POA: Insufficient documentation

## 2020-11-13 DIAGNOSIS — K219 Gastro-esophageal reflux disease without esophagitis: Secondary | ICD-10-CM | POA: Diagnosis not present

## 2020-11-13 DIAGNOSIS — M8580 Other specified disorders of bone density and structure, unspecified site: Secondary | ICD-10-CM | POA: Diagnosis not present

## 2020-11-13 DIAGNOSIS — D649 Anemia, unspecified: Secondary | ICD-10-CM

## 2020-11-13 DIAGNOSIS — T451X5A Adverse effect of antineoplastic and immunosuppressive drugs, initial encounter: Secondary | ICD-10-CM

## 2020-11-13 DIAGNOSIS — D6481 Anemia due to antineoplastic chemotherapy: Secondary | ICD-10-CM | POA: Diagnosis not present

## 2020-11-13 DIAGNOSIS — Z452 Encounter for adjustment and management of vascular access device: Secondary | ICD-10-CM | POA: Diagnosis not present

## 2020-11-13 LAB — TSH: TSH: 1.428 u[IU]/mL (ref 0.350–4.500)

## 2020-11-13 LAB — HEPATIC FUNCTION PANEL
ALT: 12 (ref 7–35)
AST: 25 (ref 13–35)
Alkaline Phosphatase: 75 (ref 25–125)
Bilirubin, Total: 0.6

## 2020-11-13 LAB — CBC AND DIFFERENTIAL
HCT: 31 — AB (ref 36–46)
Hemoglobin: 9.8 — AB (ref 12.0–16.0)
Neutrophils Absolute: 2.65
Platelets: 164 (ref 150–399)
WBC: 3.9

## 2020-11-13 LAB — IRON AND TIBC
Iron: 93 ug/dL (ref 28–170)
Saturation Ratios: 22 % (ref 10.4–31.8)
TIBC: 417 ug/dL (ref 250–450)
UIBC: 324 ug/dL

## 2020-11-13 LAB — BASIC METABOLIC PANEL
BUN: 15 (ref 4–21)
CO2: 22 (ref 13–22)
Chloride: 110 — AB (ref 99–108)
Creatinine: 0.7 (ref 0.5–1.1)
Glucose: 163
Potassium: 3.5 (ref 3.4–5.3)
Sodium: 141 (ref 137–147)

## 2020-11-13 LAB — CBC
MCV: 97 (ref 81–99)
RBC: 3.15 — AB (ref 3.87–5.11)

## 2020-11-13 LAB — FERRITIN: Ferritin: 29 ng/mL (ref 11–307)

## 2020-11-13 LAB — VITAMIN B12: Vitamin B-12: 178 pg/mL — ABNORMAL LOW (ref 180–914)

## 2020-11-13 LAB — COMPREHENSIVE METABOLIC PANEL
Albumin: 3.6 (ref 3.5–5.0)
Calcium: 8.5 — AB (ref 8.7–10.7)

## 2020-11-13 LAB — FOLATE: Folate: 28.7 ng/mL (ref >5.9)

## 2020-11-13 MED ORDER — HEPARIN SOD (PORK) LOCK FLUSH 100 UNIT/ML IV SOLN
500.0000 [IU] | Freq: Once | INTRAVENOUS | Status: AC | PRN
  Administered 2020-11-13: 500 [IU]

## 2020-11-13 MED ORDER — SODIUM CHLORIDE 0.9% FLUSH
10.0000 mL | INTRAVENOUS | Status: DC | PRN
Start: 2020-11-13 — End: 2020-11-13
  Administered 2020-11-13: 10 mL

## 2020-11-13 NOTE — Assessment & Plan Note (Addendum)
Anemia, which is felt to be due to chemotherapy. Hemoglobin is stable, but not improving despite completing chemotherapy. I will evaluate with iron studies, B12 and folate.

## 2020-11-13 NOTE — Progress Notes (Signed)
[place]  [address] [place],  [place]  [address] [phone]  Clinic Day:  11/13/2020  Referring physician: Lowella Dandy, NP  ASSESSMENT & PLAN:   Assessment & Plan: Malignant neoplasm of bronchus of right lower lobe ([facility]) Stage IIIB (T1b N3 M0) squamous cell lung cancer diagnosed in June 2022.  She received concurrent chemoradiation and completed 6 cycles of carboplatin/Abraxane.  Recent CT imaging revealed a good response. She is now receiving maintenance immunotherapy with durvalumab with plans for 1 year of therapy. She had her 1st cycle on October 11th and tolerated this well. She will proceed with a 2nd cycle on October 27th. We will plan to see her back in 2 weeks with a CBC and comprehensive metabolic panel prior to a 3rd cycle.  Anemia Anemia, which is felt to be due to chemotherapy. Hemoglobin is stable, but not improving despite completing chemotherapy. I will evaluate with iron studies, B12 and folate.    The patient understands the plans discussed today and is in agreement with them.  She knows to contact our office if she develops concerns prior to her next appointment.      Marvia Pickles, PA-C  [facility] AT [facility] [address] [place] [address] Dept: [phone] Dept Fax: [phone]   Orders Placed This Encounter  Procedures   CBC and differential    This external order was created through the Results Console.   CBC    This external order was created through the Results Console.   Basic metabolic panel    This external order was created through the Results Console.   Comprehensive metabolic panel    This external order was created through the Results Console.   Hepatic function panel    This external order was created through the Results Console.   CBC    This order was created through External Result Entry   Ferritin    Standing Status:   Future     Number of Occurrences:   1    Standing Expiration Date:   11/13/2021   Folate    Standing Status:   Future    Number of Occurrences:   1    Standing Expiration Date:   11/13/2021   Iron and TIBC    Standing Status:   Future    Number of Occurrences:   1    Standing Expiration Date:   11/13/2021   Vitamin B12    Standing Status:   Future    Number of Occurrences:   1    Standing Expiration Date:   11/13/2021       CHIEF COMPLAINT:  CC: Stage IIIB squamous cell lung cancer  Current Treatment:  Maintenance durvalumab   HISTORY OF PRESENT ILLNESS:   Oncology History  Malignant neoplasm of bronchus of right lower lobe ([place])  07/10/2020 Initial Diagnosis   Stage III squamous cell carcinoma of right lung ([place])   07/10/2020 Cancer Staging   Staging form: Lung, AJCC 8th Edition - Clinical: Stage IIIB (cT1b, cN3, cM0) - Signed by Curt Bears, MD on 07/10/2020    07/30/2020 - 09/17/2020 Chemotherapy          10/25/2020 Imaging   CT chest: 1. Interval decrease in size of the right lower lobe lung lesion.  2. Progressive airspace opacity and atelectasis in the left upper  lobe.  3. Persistent small left effusion and left lower lobe atelectasis  or  infiltrate.  4. New 15.5 mm sub solid nodular lesion in the right upper lobe just above the minor fissure. It is possible this is an area of  inflammation but attention on future studies is suggested. Recommend follow-up noncontrast chest CT in 3-4 months.  5. Stable emphysematous changes and pulmonary scarring.  6. Slightly smaller borderline enlarged mediastinal and hilar lymph  nodes.  7. Stable atherosclerotic calcifications involving the aorta and  branch vessels including three-vessel coronary artery calcifications.    10/30/2020 -  Chemotherapy   Patient is on Treatment Plan : LUNG Durvalumab q14d        INTERVAL HISTORY:  [name] is here today for repeat clinical assessment prior to a 2nd cycle of maintenance  durvalumab. She was scheduled for labs and follow up yesterday but forgot that appointment, so we will need to delay her treatment until later this week. Due to progressive airspace opacity and atelectasis in the left upper lobe on recent CT felt to be due to infection, she was treated with Augmentin 875 BID for 10 days, which she completed without difficulty. She reports intermittent cough occasionally productive of clear sputum. She denies diarrhea or rashes. She denies significant fatigue. She denies fevers or chills. She denies pain. Her appetite is good. Her weight has been stable.  REVIEW OF SYSTEMS:  Review of Systems  Constitutional:  Negative for appetite change, chills, fatigue, fever and unexpected weight change.  HENT:   Negative for lump/mass, mouth sores and sore throat.   Respiratory:  Positive for cough (intermittent, occasionally productive of clear sputum). Negative for shortness of breath.   Cardiovascular:  Negative for chest pain and leg swelling.  Gastrointestinal:  Negative for abdominal pain, constipation, diarrhea, nausea and vomiting.  Endocrine: Negative for hot flashes.  Genitourinary:  Negative for difficulty urinating, dysuria, frequency and hematuria.   Musculoskeletal:  Negative for arthralgias, back pain and myalgias.  Skin:  Negative for rash.  Neurological:  Negative for dizziness and headaches.  Hematological:  Negative for adenopathy. Does not bruise/bleed easily.  Psychiatric/Behavioral:  Negative for depression and sleep disturbance. The patient is not nervous/anxious.     VITALS:  Blood pressure (!) 123/47, pulse 69, temperature 99 F (37.2 C), temperature source Oral, resp. rate 18, height 4\' 11"  (1.499 m), weight 143 lb 12 oz (65.2 kg), SpO2 98 %.  Wt Readings from Last 3 Encounters:  11/13/20 143 lb 12 oz (65.2 kg)  11/13/20 143 lb 12 oz (65.2 kg)  10/30/20 145 lb 1.4 oz (65.8 kg)    Body mass index is 29.03 kg/m.  Performance status (ECOG): 0 -  Asymptomatic  PHYSICAL EXAM:  Physical Exam Vitals and nursing note reviewed.  Constitutional:      General: She is not in acute distress.    Appearance: Normal appearance.  HENT:     Head: Normocephalic and atraumatic.     Mouth/Throat:     Mouth: Mucous membranes are moist.     Pharynx: Oropharynx is clear. No oropharyngeal exudate or posterior oropharyngeal erythema.  Eyes:     General: No scleral icterus.    Extraocular Movements: Extraocular movements intact.     Conjunctiva/sclera: Conjunctivae normal.     Pupils: Pupils are equal, round, and reactive to light.  Cardiovascular:     Rate and Rhythm: Normal rate and regular rhythm.     Heart sounds: Normal heart sounds. No murmur heard.   No friction rub. No gallop.  Pulmonary:     Effort:  Pulmonary effort is normal.     Breath sounds: Normal breath sounds. No wheezing, rhonchi or rales.  Abdominal:     General: There is no distension.     Palpations: Abdomen is soft. There is no hepatomegaly, splenomegaly or mass.     Tenderness: There is no abdominal tenderness.  Musculoskeletal:        General: Normal range of motion.     Cervical back: Normal range of motion and neck supple. No tenderness.     Right lower leg: No edema.     Left lower leg: No edema.  Lymphadenopathy:     Cervical: No cervical adenopathy.     Upper Body:     Right upper body: No supraclavicular or axillary adenopathy.     Left upper body: No supraclavicular or axillary adenopathy.     Lower Body: No right inguinal adenopathy. No left inguinal adenopathy.  Skin:    General: Skin is warm and dry.     Coloration: Skin is not jaundiced.     Findings: No rash.  Neurological:     Mental Status: She is alert and oriented to person, place, and time.     Cranial Nerves: No cranial nerve deficit.  Psychiatric:        Mood and Affect: Mood normal.        Behavior: Behavior normal.        Thought Content: Thought content normal.    LABS:   CBC Latest  Ref Rng & Units 11/13/2020 10/25/2020 09/25/2020  WBC - 3.9 5.0 3.5  Hemoglobin 12.0 - 16.0 9.8(A) 10.0(A) 9.6(A)  Hematocrit 36 - 46 31(A) 31(A) 29(A)  Platelets 150 - 399 164 136(A) 187   CMP Latest Ref Rng & Units 11/13/2020 10/25/2020 09/25/2020  Glucose 70 - 99 mg/dL - - -  BUN 4 - 21 15 17 18   Creatinine 0.5 - 1.1 0.7 0.8 0.8  Sodium 137 - 147 141 142 143  Potassium 3.4 - 5.3 3.5 3.8 3.0(A)  Chloride 99 - 108 110(A) 110(A) 108  CO2 13 - 22 22 24(A) 25(A)  Calcium 8.7 - 10.7 8.5(A) 8.9 8.8  Total Protein 6.5 - 8.1 g/dL - - -  Total Bilirubin 0.3 - 1.2 mg/dL - - -  Alkaline Phos 25 - 125 75 74 69  AST 13 - 35 25 26 24   ALT 7 - 35 12 14 15      Lab Results  Component Value Date   CEA1 6.9 (H) 09/25/2020   /  CEA  Date Value Ref Range Status  09/25/2020 6.9 (H) 0.0 - 4.7 ng/mL Final    Comment:    (NOTE)                             Nonsmokers          <3.9                             Smokers             <5.6 [employer] Electrochemiluminescence Immunoassay (ECLIA) Values obtained with different assay methods or kits cannot be used interchangeably.  Results cannot be interpreted as absolute evidence of the presence or absence of malignant disease. Performed At: [facility] [place], [place] [id number] Rush Farmer MD EZ:[id number]    No results found for: PSA1 No results found for: TKP546 No  results found for: CAN125  No results found for: TOTALPROTELP, ALBUMINELP, A1GS, A2GS, BETS, BETA2SER, GAMS, MSPIKE, SPEI Lab Results  Component Value Date   IRONPCTSAT 7.7 (L) 06/19/2020   Lab Results  Component Value Date   LDH 166 06/19/2020    STUDIES:  No results found.    HISTORY:   Past Medical History:  Diagnosis Date   Acute respiratory failure with hypoxia s/p tracheostomy 07/20/2013   Anemia    a. 7-08/2013 felt due to recent critical illness/chronic disease.   Arthritis    handds, knees & back    Barrett's esophagus    CAD  (coronary artery disease)    a. Mod dz 2010 initially mgd medically. b. 12/2012 - angina s/p PTCA/DES to mid-circumflex, PTCA/DES to first OM.    Carotid artery disease ([place])    a. 60-70% bilat ICA stenosis by dopplers 05/2012.   Chronic back pain    deteriorating;DDD   Colonic obstruction due to suspected colitis; s/p colectomy/colostomy    a. See SBO.   Complication of anesthesia    hard to wake up from anesthesia    COPD (chronic obstructive pulmonary disease) ([facility])    Depression    takes Remeron and Zoloft daily   Dizziness    was on Bentyl which caused this-took off of it and no problems since   DVT of axillary vein, acute right ([place])    a. Dx 07/2013 felt due to R IJ central line that had been inserted 7/14. Not on anticoag due to GIB issues and small cerebral bleed.   GERD (gastroesophageal reflux disease)    Heart murmur    History of bronchitis    > 30yrs ago    History of colon polyps    History of hiatal hernia    History of kidney stones    was told it was stable but doesn't know for sure that she ever passed it   HTN (hypertension)    Hypertension    takes Metoprolol daily   Intracranial hemorrhage ([place])    a. 08/11/13 - per DC summary, tiny SAH vs SDH done for altered mentation, anticoag stopped including aspirin.   Joint pain    Neck pain    DDD   Other and unspecified hyperlipidemia    takes Lipitor daily   Pneumonia    hx of->15 yrs ago   Protein-calorie malnutrition, severe w/ electrolyte imbalance    a. Severe hypoalbuminemia leading to 3rd spacing including anasarca and pulm edema 07/2013.   SBO (small bowel obstruction) ([place])    a. Lysis of adhesions and ovarian cystectomy 04/2013 for sBO. b. Admitted 07/2013 for colonic obstruction due to suspected colitis, s/p partial colectomy/colostomy 08/01/13. Admission complicated by anasarca, acute resp failure requiring tracheostomy, decannulated 08/25/13. c. Recurrent SBO8/2015, NGT placed for decompression.    Stroke  [facility]) early 80's   right sided weakness--TIA    Past Surgical History:  Procedure Laterality Date   ABDOMINAL HYSTERECTOMY     APPENDECTOMY     BOWEL RESECTION N/A 08/01/2013   Procedure: SMALL BOWEL RESECTION;  Surgeon: Harl Bowie, MD;  Location: [place];  Service: General;  Laterality: N/A;   BRONCHIAL BIOPSY  07/02/2020   Procedure: BRONCHIAL BIOPSIES;  Surgeon: Collene Gobble, MD;  Location: [facility] ENDOSCOPY;  Service: Pulmonary;;   BRONCHIAL BRUSHINGS  07/02/2020   Procedure: BRONCHIAL BRUSHINGS;  Surgeon: Collene Gobble, MD;  Location: [facility] ENDOSCOPY;  Service: Pulmonary;;   BRONCHIAL NEEDLE ASPIRATION BIOPSY  07/02/2020  Procedure: BRONCHIAL NEEDLE ASPIRATION BIOPSIES;  Surgeon: Collene Gobble, MD;  Location: [facility] ENDOSCOPY;  Service: Pulmonary;;   BRONCHIAL WASHINGS  07/02/2020   Procedure: BRONCHIAL WASHINGS;  Surgeon: Collene Gobble, MD;  Location: [facility] ENDOSCOPY;  Service: Pulmonary;;   cataract surgery Bilateral    CHOLECYSTECTOMY     COLON RESECTION     COLON SURGERY     COLOSTOMY N/A 08/01/2013   Procedure: COLOSTOMY;  Surgeon: Harl Bowie, MD;  Location: [place];  Service: General;  Laterality: N/A;   COLOSTOMY TAKEDOWN  01/24/2014   dr blackman   COLOSTOMY TAKEDOWN N/A 01/24/2014   Procedure: COLOSTOMY TAKEDOWN;  Surgeon: Coralie Keens, MD;  Location: [place];  Service: General;  Laterality: N/A;   CORONARY ANGIOPLASTY WITH STENT PLACEMENT  12/20/2012   STENT TO OM         DR COOPER   ENDARTERECTOMY Right 08/08/2014   Procedure: RIGHT CAROTID ENDARTERECTOMY WITH HEMASHIELD PATCH ANGIOPLASTY;  Surgeon: Elam Dutch, MD;  Location: [place];  Service: Vascular;  Laterality: Right;   FIDUCIAL MARKER PLACEMENT  07/02/2020   Procedure: FIDUCIAL MARKER PLACEMENT;  Surgeon: Collene Gobble, MD;  Location: [facility] ENDOSCOPY;  Service: Pulmonary;;   FOOT SURGERY Left    HAND SURGERY Bilateral    for ganglion cysts   HEMOSTASIS CONTROL  07/02/2020   Procedure: HEMOSTASIS CONTROL;   Surgeon: Collene Gobble, MD;  Location: [place] ENDOSCOPY;  Service: Pulmonary;;  ice saline and epi   IR IMAGING GUIDED PORT INSERTION  08/23/2020   LAPAROTOMY N/A 08/01/2013   Procedure: EXPLORATORY LAPAROTOMY;  Surgeon: Harl Bowie, MD;  Location: [place];  Service: General;  Laterality: N/A;   LEFT HEART CATH AND CORONARY ANGIOGRAPHY N/A 01/10/2019   Procedure: LEFT HEART CATH AND CORONARY ANGIOGRAPHY;  Surgeon: Troy Sine, MD;  Location: [place] CV LAB;  Service: Cardiovascular;  Laterality: N/A;   LEFT HEART CATHETERIZATION WITH CORONARY ANGIOGRAM N/A 12/20/2012   Procedure: LEFT HEART CATHETERIZATION WITH CORONARY ANGIOGRAM;  Surgeon: Blane Ohara, MD;  Location[facility] CATH LAB;  Service: Cardiovascular;  Laterality: N/A;   PARTIAL COLECTOMY N/A 08/01/2013   Procedure: PARTIAL COLECTOMY;  Surgeon: Harl Bowie, MD;  Location: [place];  Service: General;  Laterality: N/A;   ROTATOR CUFF REPAIR Left    TONSILLECTOMY     TRACHEOSTOMY     feinstein   VIDEO BRONCHOSCOPY WITH ENDOBRONCHIAL NAVIGATION N/A 07/02/2020   Procedure: VIDEO BRONCHOSCOPY WITH ENDOBRONCHIAL NAVIGATION;  Surgeon: Collene Gobble, MD;  Location: [place] ENDOSCOPY;  Service: Pulmonary;  Laterality: N/A;    Family History  Problem Relation Age of Onset   Varicose Veins Mother    Heart disease Father    Heart attack Father    AAA (abdominal aortic aneurysm) Father    Heart disease Other     Social History:  reports that she quit smoking about 4 months ago. Her smoking use included cigarettes. She has a 60.00 pack-year smoking history. She has never used smokeless tobacco. She reports that she does not drink alcohol and does not use drugs.The patient is alone today.  Allergies:  Allergies  Allergen Reactions   Pregabalin Swelling    Tongue swelling   Paclitaxel    Sulfonamide Derivatives Swelling    Childhood reaction    Current Medications: Current Outpatient Medications  Medication Sig Dispense  Refill   aspirin EC 81 MG tablet Take 2 tablets (162 mg total) by mouth in the morning. Okay to restart  this medication on 07/04/2020 30 tablet 11   atorvastatin (LIPITOR) 80 MG tablet Take 80 mg by mouth every evening.      buPROPion (WELLBUTRIN XL) 150 MG 24 hr tablet Take 1 tablet (150 mg total) by mouth daily. 90 tablet 3   fish oil-omega-3 fatty acids 1000 MG capsule Take 1,000 mg by mouth in the morning.     fluticasone (FLONASE) 50 MCG/ACT nasal spray Place 1 spray into both nostrils daily as needed for allergies.     folic acid (FOLVITE) 462 MCG tablet Take 800 mcg by mouth in the morning.     HYDROcodone-acetaminophen (HYCET) 7.5-325 mg/15 ml solution Take 10 mLs by mouth 4 (four) times daily as needed for moderate pain. 120 mL 0   metoprolol succinate (TOPROL-XL) 50 MG 24 hr tablet TAKE 1 TABLET BY MOUTH  DAILY 90 tablet 3   mirtazapine (REMERON) 45 MG tablet Take 45 mg by mouth at bedtime.      nitroGLYCERIN (NITROSTAT) 0.4 MG SL tablet DISSOLVE 1 TABLET UNDER THE TONGUE EVERY 5 MINUTES AS  NEEDED FOR CHEST PAIN. MAX  OF 3 TABLETS IN 15 MINUTES. CALL 911 IF PAIN PERSISTS. 100 tablet 3   ondansetron (ZOFRAN) 4 MG tablet Take 4 mg by mouth every 8 (eight) hours as needed for nausea or vomiting.     pantoprazole (PROTONIX) 40 MG tablet Take 40 mg by mouth in the morning.     sertraline (ZOLOFT) 100 MG tablet Take 200 mg by mouth in the morning.     zolpidem (AMBIEN) 5 MG tablet Take 1 tablet (5 mg total) by mouth at bedtime as needed for sleep. 30 tablet 0   No current facility-administered medications for this visit.

## 2020-11-13 NOTE — Patient Instructions (Signed)
[employer], Adult A central line is a long, thin tube (catheter) that is put into a vein so that it goes to a large vein above your heart. It can be used to: Give you medicine or fluids. Give you food and nutrients. Take blood or give you blood for testing or treatments. Types of central lines There are four main types of central lines: Peripherally inserted central catheter (PICC) line. This type is usually put in the upper arm and goes up the arm to the heart. Tunneled central line. This type is placed in a large vein in the neck, chest, or groin. It is tunneled under the skin and brought out through a second incision. Non-tunneled central line. This type is used for a shorter time than other types, usually for 7 days at the most. It is inserted in the neck, chest, or groin. Implanted port. This type can stay in place longer than other types of central lines. It is normally put in the upper chest but can also be placed in the upper arm or the belly. Surgery is needed to put it in and take it out. The type of central line you get will depend on how long you need it and your medical condition. Tell a doctor about: Any allergies you have. All medicines you are taking. These include vitamins, herbs, eye drops, creams, and over-the-counter medicines. Any problems you or family members have had with anesthetic medicines. Any blood disorders you have. Any surgeries you have had. Any medical conditions you have. Whether you are pregnant or may be pregnant. What are the risks? Generally, central lines are safe. However, problems may occur, including: Infection. A blood clot. Bleeding from the place where the central line was inserted. Getting a hole or crack in the central line. If this happens, the central line will need to be replaced. Central line failure. The catheter moving or coming out of place. What happens before the procedure? Medicines Ask your doctor about changing or  stopping: Your normal medicines. Vitamins, herbs, and supplements. Over-the-counter medicines. Do not take aspirin or ibuprofen unless you are told to. General instructions Follow instructions from your doctor about eating or drinking. For your safety, your doctor may: [place] the area of the procedure. Remove hair at the procedure site. Ask you to wash with a soap that kills germs. Plan to have a responsible adult take you home from the hospital or clinic. If you will be going home right after the procedure, plan to have a responsible adult care for you for the time you are told. This is important. What happens during the procedure? An IV tube will be put into one of your veins. You may be given: A sedative. This medicine helps you relax. Anesthetics. These medicines numb certain areas of your body. Your skin will be cleaned with a germ-killing (antiseptic) solution. You may be covered with clean drapes. Your blood pressure, heart rate, breathing rate, and blood oxygen level will be monitored during the procedure. The central line will be put into the vein and moved through it to the correct spot. The doctor may use X-ray equipment to help guide the central line to the right place. A bandage (dressing) will be placed over the insertion area. The procedure may vary among doctors and hospitals. What can I expect after the procedure? You will be monitored until you leave the hospital or clinic. This includes checking your blood pressure, heart rate, breathing rate, and blood oxygen level. Caps may  be placed on the ends of the central line tubing. If you were given a sedative during your procedure, do not drive or use machines until your doctor says that it is safe. Follow these instructions at home: Caring for the tube  Follow instructions from your doctor about: Flushing the tube. Cleaning the tube and the area around it. Only use germ-free (sterile) supplies to flush. The supplies  should be from your doctor, a pharmacy, or another place that your doctor recommends. Before you flush the tube or clean the area around the tube: Wash your hands with soap and water for at least 20 seconds. If you cannot use soap and water, use hand sanitizer. Clean the central line hub with rubbing alcohol. To do this: Scrub it using a twisting motion and rub for 10 to 15 seconds or for 30 twists. Follow the manufacturer's instructions. Be sure you scrub the top of the hub, not just the sides. Never reuse alcohol pads. Let the hub dry before use. Keep it from touching anything while drying. Caring for your skin Check the skin around the central line every day for signs of infection. Check for: Redness, swelling, or pain. Fluid or blood. Warmth. Pus or a bad smell. Keep the area where the tube was put in clean and dry. Change bandages only as told by your doctor. Keep your bandage dry. If a bandage gets wet, have it changed right away. General instructions Keep the tube clamped, unless it is being used. If you or someone else accidentally pulls on the tube, make sure: The bandage is okay. There is no bleeding. The tube has not been pulled out. Do not use scissors or sharp objects near the tube. Do not take baths, swim, or use a hot tub until your doctor says it is okay. Ask your doctor if you may take showers. You may only be allowed to take sponge baths. Ask your doctor what activities are safe for you. Your doctor may tell you not to lift anything or move your arm too much. Take over-the-counter and prescription medicines only as told by your doctor. Keep all follow-up visits. Storing and throwing away supplies Keep your supplies in a clean, dry location. Throw away any used syringes in a container that is only for sharp items (sharps container). You can buy a sharps container from a pharmacy, or you can make one by using an empty hard plastic bottle with a cover. Place any used  bandages or infusion bags into a plastic bag. Throw that bag in the trash. Contact a doctor if: You have any of these signs of infection where the tube was put in: Redness, swelling, or pain. Fluid or blood. Warmth. Pus or a bad smell. Get help right away if: You have: A fever or chills. Shortness of breath. Pain in your chest. A fast heartbeat. Swelling in your neck, face, chest, or arm. You feel dizzy or you faint. There are red lines coming from where the tube was put in. The area where the tube was put in is bleeding and the bleeding will not stop. Your tube is hard to flush. You do not get a blood return from the tube. The tube gets loose or comes out. The tube has a hole or a tear. The tube leaks. Summary A central line is a long, thin tube (catheter) that is put in your vein. It can be used to give you medicine, food, or fluids. Follow instructions from your doctor about flushing  and cleaning the tube. Keep the area where the tube was put in clean and dry. Ask your doctor what activities are safe for you. This information is not intended to replace advice given to you by your health care provider. Make sure you discuss any questions you have with your health care provider. Document Revised: 09/08/2019 Document Reviewed: 09/08/2019 Elsevier Patient Education  2022 [employer].

## 2020-11-13 NOTE — Assessment & Plan Note (Signed)
Stage IIIB (T1b N3 M0) squamous cell lung cancer diagnosed in June 2022.  She received concurrent chemoradiation and completed 6 cycles of carboplatin/Abraxane.  Recent CT imaging revealed a good response. She is now receiving maintenance immunotherapy with durvalumab with plans for 1 year of therapy. She had her 1st cycle on October 11th and tolerated this well. She will proceed with a 2nd cycle on October 27th. We will plan to see her back in 2 weeks with a CBC and comprehensive metabolic panel prior to a 3rd cycle.

## 2020-11-13 NOTE — Telephone Encounter (Signed)
Per 10/25 los next appt scheduled and given to patient

## 2020-11-14 ENCOUNTER — Encounter: Payer: Self-pay | Admitting: Hematology and Oncology

## 2020-11-14 ENCOUNTER — Ambulatory Visit (INDEPENDENT_AMBULATORY_CARE_PROVIDER_SITE_OTHER): Payer: Medicare Other | Admitting: Physician Assistant

## 2020-11-14 ENCOUNTER — Other Ambulatory Visit: Payer: Self-pay | Admitting: Hematology and Oncology

## 2020-11-14 ENCOUNTER — Ambulatory Visit (HOSPITAL_COMMUNITY)
Admission: RE | Admit: 2020-11-14 | Discharge: 2020-11-14 | Disposition: A | Discharge status: Home / Self Care | Payer: Medicare Other | Source: Ambulatory Visit | Attending: Vascular Surgery | Admitting: Vascular Surgery

## 2020-11-14 VITALS — BP 123/63 | HR 68 | Temp 98.0°F | Resp 16 | Ht 59.0 in | Wt 143.7 lb

## 2020-11-14 DIAGNOSIS — I6523 Occlusion and stenosis of bilateral carotid arteries: Secondary | ICD-10-CM

## 2020-11-14 DIAGNOSIS — E538 Deficiency of other specified B group vitamins: Secondary | ICD-10-CM

## 2020-11-14 HISTORY — DX: Deficiency of other specified B group vitamins: E53.8

## 2020-11-14 LAB — T4: T4, Total: 5.4 ug/dL (ref 4.5–12.0)

## 2020-11-14 NOTE — Progress Notes (Signed)
History of Present Illness:  Patient is a 80 y.o. year old female who presents for evaluation of carotid stenosis.  She is s/p right carotid endarterectomy on 08/08/2014 by Dr. Oneida Alar. Unfortunately, she suffered a perioperative stroke at that time resulting in left arm weakness and numbness. She continues to have residual left sided weakness and numbness.  This is unchanged. She ambulates with a cane.      She has a new diagnosis of lung cancer and is being treated.     Past Medical History:  Diagnosis Date   Acute respiratory failure with hypoxia s/p tracheostomy 07/20/2013   Anemia    a. 7-08/2013 felt due to recent critical illness/chronic disease.   Arthritis    handds, knees & back    B12 deficiency 11/14/2020   Barrett's esophagus    CAD (coronary artery disease)    a. Mod dz 2010 initially mgd medically. b. 12/2012 - angina s/p PTCA/DES to mid-circumflex, PTCA/DES to first OM.    Cancer [facility])    Carotid artery disease ([place])    a. 60-70% bilat ICA stenosis by dopplers 05/2012.   Chronic back pain    deteriorating;DDD   Colonic obstruction due to suspected colitis; s/p colectomy/colostomy    a. See SBO.   Complication of anesthesia    hard to wake up from anesthesia    COPD (chronic obstructive pulmonary disease) (HCC)    Depression    takes Remeron and Zoloft daily   Dizziness    was on Bentyl which caused this-took off of it and no problems since   DVT of axillary vein, acute right ([place])    a. Dx 07/2013 felt due to R IJ central line that had been inserted 7/14. Not on anticoag due to GIB issues and small cerebral bleed.   GERD (gastroesophageal reflux disease)    Heart murmur    History of bronchitis    > 66yrs ago    History of colon polyps    History of hiatal hernia    History of kidney stones    was told it was stable but doesn't know for sure that she ever passed it   HTN (hypertension)    Hypertension    takes Metoprolol daily   Intracranial hemorrhage  ([place])    a. 08/11/13 - per DC summary, tiny SAH vs SDH done for altered mentation, anticoag stopped including aspirin.   Joint pain    Neck pain    DDD   Other and unspecified hyperlipidemia    takes Lipitor daily   Pneumonia    hx of->15 yrs ago   Protein-calorie malnutrition, severe w/ electrolyte imbalance    a. Severe hypoalbuminemia leading to 3rd spacing including anasarca and pulm edema 07/2013.   SBO (small bowel obstruction) ([place])    a. Lysis of adhesions and ovarian cystectomy 04/2013 for sBO. b. Admitted 07/2013 for colonic obstruction due to suspected colitis, s/p partial colectomy/colostomy 08/01/13. Admission complicated by anasarca, acute resp failure requiring tracheostomy, decannulated 08/25/13. c. Recurrent SBO8/2015, NGT placed for decompression.    Stroke [facility]) early 80's   right sided weakness--TIA    Past Surgical History:  Procedure Laterality Date   ABDOMINAL HYSTERECTOMY     APPENDECTOMY     BOWEL RESECTION N/A 08/01/2013   Procedure: SMALL BOWEL RESECTION;  Surgeon: Harl Bowie, MD;  Location: [place];  Service: General;  Laterality: N/A;   BRONCHIAL BIOPSY  07/02/2020   Procedure: BRONCHIAL BIOPSIES;  Surgeon:  Collene Gobble, MD;  Location[facility] ENDOSCOPY;  Service: Pulmonary;;   BRONCHIAL BRUSHINGS  07/02/2020   Procedure: BRONCHIAL BRUSHINGS;  Surgeon: Collene Gobble, MD;  Location: [facility] ENDOSCOPY;  Service: Pulmonary;;   BRONCHIAL NEEDLE ASPIRATION BIOPSY  07/02/2020   Procedure: BRONCHIAL NEEDLE ASPIRATION BIOPSIES;  Surgeon: Collene Gobble, MD;  Location: [facility] ENDOSCOPY;  Service: Pulmonary;;   BRONCHIAL WASHINGS  07/02/2020   Procedure: BRONCHIAL WASHINGS;  Surgeon: Collene Gobble, MD;  Location: [facility] ENDOSCOPY;  Service: Pulmonary;;   cataract surgery Bilateral    CHOLECYSTECTOMY     COLON RESECTION     COLON SURGERY     COLOSTOMY N/A 08/01/2013   Procedure: COLOSTOMY;  Surgeon: Harl Bowie, MD;  Location: [place];  Service: General;  Laterality: N/A;    COLOSTOMY TAKEDOWN  01/24/2014   dr blackman   COLOSTOMY TAKEDOWN N/A 01/24/2014   Procedure: COLOSTOMY TAKEDOWN;  Surgeon: Coralie Keens, MD;  Location: [place];  Service: General;  Laterality: N/A;   CORONARY ANGIOPLASTY WITH STENT PLACEMENT  12/20/2012   STENT TO OM         DR COOPER   ENDARTERECTOMY Right 08/08/2014   Procedure: RIGHT CAROTID ENDARTERECTOMY WITH HEMASHIELD PATCH ANGIOPLASTY;  Surgeon: Elam Dutch, MD;  Location: [place];  Service: Vascular;  Laterality: Right;   FIDUCIAL MARKER PLACEMENT  07/02/2020   Procedure: FIDUCIAL MARKER PLACEMENT;  Surgeon: Collene Gobble, MD;  Location: [place] ENDOSCOPY;  Service: Pulmonary;;   FOOT SURGERY Left    HAND SURGERY Bilateral    for ganglion cysts   HEMOSTASIS CONTROL  07/02/2020   Procedure: HEMOSTASIS CONTROL;  Surgeon: Collene Gobble, MD;  Location: [place] ENDOSCOPY;  Service: Pulmonary;;  ice saline and epi   IR IMAGING GUIDED PORT INSERTION  08/23/2020   LAPAROTOMY N/A 08/01/2013   Procedure: EXPLORATORY LAPAROTOMY;  Surgeon: Harl Bowie, MD;  Location: [place];  Service: General;  Laterality: N/A;   LEFT HEART CATH AND CORONARY ANGIOGRAPHY N/A 01/10/2019   Procedure: LEFT HEART CATH AND CORONARY ANGIOGRAPHY;  Surgeon: Troy Sine, MD;  Location: [place] CV LAB;  Service: Cardiovascular;  Laterality: N/A;   LEFT HEART CATHETERIZATION WITH CORONARY ANGIOGRAM N/A 12/20/2012   Procedure: LEFT HEART CATHETERIZATION WITH CORONARY ANGIOGRAM;  Surgeon: Blane Ohara, MD;  Location: [facility] CATH LAB;  Service: Cardiovascular;  Laterality: N/A;   PARTIAL COLECTOMY N/A 08/01/2013   Procedure: PARTIAL COLECTOMY;  Surgeon: Harl Bowie, MD;  Location: [place];  Service: General;  Laterality: N/A;   ROTATOR CUFF REPAIR Left    TONSILLECTOMY     TRACHEOSTOMY     feinstein   VIDEO BRONCHOSCOPY WITH ENDOBRONCHIAL NAVIGATION N/A 07/02/2020   Procedure: VIDEO BRONCHOSCOPY WITH ENDOBRONCHIAL NAVIGATION;  Surgeon: Collene Gobble, MD;   Location: [place] ENDOSCOPY;  Service: Pulmonary;  Laterality: N/A;     Social History Social History   Tobacco Use   Smoking status: Former    Packs/day: 1.00    Years: 60.00    Pack years: 60.00    Types: Cigarettes    Quit date: 06/2020    Years since quitting: 0.4   Smokeless tobacco: Never   Tobacco comments:    Stopped smoking in May 2022. ARJ 08/10/20  Vaping Use   Vaping Use: Never used  Substance Use Topics   Alcohol use: No    Alcohol/week: 0.0 standard drinks   Drug use: No    Family History Family History  Problem Relation Age of  Onset   Varicose Veins Mother    Heart disease Father    Heart attack Father    AAA (abdominal aortic aneurysm) Father    Heart disease Other     Allergies  Allergies  Allergen Reactions   Pregabalin Swelling    Tongue swelling   Paclitaxel    Sulfonamide Derivatives Swelling    Childhood reaction     Current Outpatient Medications  Medication Sig Dispense Refill   aspirin EC 81 MG tablet Take 2 tablets (162 mg total) by mouth in the morning. Okay to restart this medication on 07/04/2020 30 tablet 11   atorvastatin (LIPITOR) 80 MG tablet Take 80 mg by mouth every evening.      buPROPion (WELLBUTRIN XL) 150 MG 24 hr tablet Take 1 tablet (150 mg total) by mouth daily. 90 tablet 3   fish oil-omega-3 fatty acids 1000 MG capsule Take 1,000 mg by mouth in the morning.     fluticasone (FLONASE) 50 MCG/ACT nasal spray Place 1 spray into both nostrils daily as needed for allergies.     folic acid (FOLVITE) 097 MCG tablet Take 800 mcg by mouth in the morning.     HYDROcodone-acetaminophen (HYCET) 7.5-325 mg/15 ml solution Take 10 mLs by mouth 4 (four) times daily as needed for moderate pain. 120 mL 0   metoprolol succinate (TOPROL-XL) 50 MG 24 hr tablet TAKE 1 TABLET BY MOUTH  DAILY 90 tablet 3   mirtazapine (REMERON) 45 MG tablet Take 45 mg by mouth at bedtime.      nitroGLYCERIN (NITROSTAT) 0.4 MG SL tablet DISSOLVE 1 TABLET UNDER THE  TONGUE EVERY 5 MINUTES AS  NEEDED FOR CHEST PAIN. MAX  OF 3 TABLETS IN 15 MINUTES. CALL 911 IF PAIN PERSISTS. 100 tablet 3   ondansetron (ZOFRAN) 4 MG tablet Take 4 mg by mouth every 8 (eight) hours as needed for nausea or vomiting.     pantoprazole (PROTONIX) 40 MG tablet Take 40 mg by mouth in the morning.     sertraline (ZOLOFT) 100 MG tablet Take 200 mg by mouth in the morning.     zolpidem (AMBIEN) 5 MG tablet Take 1 tablet (5 mg total) by mouth at bedtime as needed for sleep. 30 tablet 0   No current facility-administered medications for this visit.    ROS:   General:  No weight loss, Fever, chills  HEENT: No recent headaches, no nasal bleeding, no visual changes, no sore throat  Neurologic: No dizziness, blackouts, seizures. No recent symptoms of stroke or mini- stroke. No recent episodes of slurred speech, or temporary blindness.  Cardiac: No recent episodes of chest pain/pressure, no shortness of breath at rest.  No shortness of breath with exertion.  Denies history of atrial fibrillation or irregular heartbeat  Vascular: No history of rest pain in feet.  No history of claudication.  No history of non-healing ulcer, No history of DVT   Pulmonary: No home oxygen, no productive cough, no hemoptysis,  No asthma or wheezing  Musculoskeletal:  [ ]  Arthritis, [ ]  Low back pain,  [ ]  Joint pain  Hematologic:No history of hypercoagulable state.  No history of easy bleeding.  No history of anemia  Gastrointestinal: No hematochezia or melena,  No gastroesophageal reflux, no trouble swallowing  Urinary: [ ]  chronic Kidney disease, [ ]  on HD - [ ]  MWF or [ ]  TTHS, [ ]  Burning with urination, [ ]  Frequent urination, [ ]  Difficulty urinating;   Skin: No rashes  Psychological:  No history of anxiety,  No history of depression   Physical Examination  Vitals:   11/14/20 1324  BP: 123/63  Pulse: 68  Resp: 16  Temp: 98 F (36.7 C)  TempSrc: Temporal  SpO2: 98%  Weight: 143 lb 11.2  oz (65.2 kg)  Height: 4\' 11"  (1.499 m)    Body mass index is 29.02 kg/m.  General:  Alert and oriented, no acute distress HEENT: Normal Neck: No bruit or JVD Pulmonary: Clear to auscultation bilaterally Cardiac: Regular Rate and Rhythm without murmur Gastrointestinal: Soft, non-tender, non-distended, no mass, no scars Skin: No rash Extremity Pulses:  2+ radial Musculoskeletal: No deformity or edema  Neurologic: Upper and lower extremity motor 5/5 and symmetric  DATA:     Right Carotid Findings:  +----------+--------+--------+--------+------------------+--------+            PSV cm/sEDV cm/sStenosisPlaque DescriptionComments  +----------+--------+--------+--------+------------------+--------+  CCA Prox  99      12              heterogenous                +----------+--------+--------+--------+------------------+--------+  CCA Distal80      14              heterogenous                +----------+--------+--------+--------+------------------+--------+  ICA Prox  99      27      1-39%   heterogenous                +----------+--------+--------+--------+------------------+--------+  ICA Distal95      26                                          +----------+--------+--------+--------+------------------+--------+  ECA       94                                                  +----------+--------+--------+--------+------------------+--------+   +----------+--------+-------+--------+-------------------+            PSV cm/sEDV cmsDescribeArm Pressure (mmHG)  +----------+--------+-------+--------+-------------------+  [id number]                                         +----------+--------+-------+--------+-------------------+   +---------+--------+--+--------+--+---------+  VertebralPSV cm/s75EDV cm/s14Antegrade  +---------+--------+--+--------+--+---------+       Left Carotid Findings:   +----------+--------+--------+--------+-------------------------+--------+            PSV cm/sEDV cm/sStenosisPlaque Description       Comments  +----------+--------+--------+--------+-------------------------+--------+  CCA Prox  104     14              heterogenous                       +----------+--------+--------+--------+-------------------------+--------+  CCA Distal72      14              heterogenous                       +----------+--------+--------+--------+-------------------------+--------+  ICA Prox  257     71      60-79%  heterogenous and calcific          +----------+--------+--------+--------+-------------------------+--------+  ICA Mid   139     27                                                 +----------+--------+--------+--------+-------------------------+--------+  ICA Distal140     28                                                 +----------+--------+--------+--------+-------------------------+--------+  ECA       101                                                        +----------+--------+--------+--------+-------------------------+--------+   +----------+--------+--------+--------+-------------------+            PSV cm/sEDV cm/sDescribeArm Pressure (mmHG)  +----------+--------+--------+--------+-------------------+  Subclavian108                                          +----------+--------+--------+--------+-------------------+   +---------+--------+--+--------+--+---------+  VertebralPSV cm/s68EDV cm/s13Antegrade  +---------+--------+--+--------+--+---------+   Summary:  Right Carotid: Velocities in the right ICA are consistent with a 1-39%  stenosis.   Left Carotid: Velocities in the left ICA are consistent with a 60-79%  stenosis.   Vertebrals:  Bilateral vertebral arteries demonstrate antegrade flow.  Subclavians: Normal flow hemodynamics were seen in bilateral subclavian                arteries.   ASSESSMENT/Plan: Carotid stenosis s/p right CEA with  Residual peri-operative stroke left sided weakness unchanged.  No acute symptoms.  Seek immediate medical attention should these occur.  Follow-up in 6 months due to increased left ICA velocities compared to last duplex.   with carotid duplex.      Roxy Horseman PA-C [facility] Office: [phone]  MD in clinic [place]

## 2020-11-15 ENCOUNTER — Other Ambulatory Visit: Payer: Self-pay

## 2020-11-15 ENCOUNTER — Ambulatory Visit: Payer: Medicare Other

## 2020-11-15 ENCOUNTER — Inpatient Hospital Stay: Payer: Medicare Other

## 2020-11-15 VITALS — BP 137/62 | HR 75 | Temp 98.4°F | Resp 18 | Ht 59.0 in | Wt 145.2 lb

## 2020-11-15 DIAGNOSIS — Z452 Encounter for adjustment and management of vascular access device: Secondary | ICD-10-CM | POA: Diagnosis not present

## 2020-11-15 DIAGNOSIS — Z5112 Encounter for antineoplastic immunotherapy: Secondary | ICD-10-CM | POA: Diagnosis not present

## 2020-11-15 DIAGNOSIS — Z95828 Presence of other vascular implants and grafts: Secondary | ICD-10-CM

## 2020-11-15 DIAGNOSIS — C3431 Malignant neoplasm of lower lobe, right bronchus or lung: Secondary | ICD-10-CM

## 2020-11-15 DIAGNOSIS — K219 Gastro-esophageal reflux disease without esophagitis: Secondary | ICD-10-CM | POA: Diagnosis not present

## 2020-11-15 DIAGNOSIS — M8580 Other specified disorders of bone density and structure, unspecified site: Secondary | ICD-10-CM | POA: Diagnosis not present

## 2020-11-15 DIAGNOSIS — D6481 Anemia due to antineoplastic chemotherapy: Secondary | ICD-10-CM | POA: Diagnosis not present

## 2020-11-15 DIAGNOSIS — I6523 Occlusion and stenosis of bilateral carotid arteries: Secondary | ICD-10-CM

## 2020-11-15 DIAGNOSIS — R5383 Other fatigue: Secondary | ICD-10-CM | POA: Diagnosis not present

## 2020-11-15 MED ORDER — HEPARIN SOD (PORK) LOCK FLUSH 100 UNIT/ML IV SOLN
500.0000 [IU] | Freq: Once | INTRAVENOUS | Status: AC | PRN
  Administered 2020-11-15: 500 [IU]

## 2020-11-15 MED ORDER — SODIUM CHLORIDE 0.9 % IV SOLN
10.0000 mg/kg | Freq: Once | INTRAVENOUS | Status: AC
  Administered 2020-11-15: 620 mg via INTRAVENOUS
  Filled 2020-11-15: qty 12.4
  Filled 2020-11-15: qty 10

## 2020-11-15 MED ORDER — CYANOCOBALAMIN 1000 MCG/ML IJ SOLN
1000.0000 ug | Freq: Once | INTRAMUSCULAR | Status: AC
  Administered 2020-11-15: 1000 ug via INTRAMUSCULAR
  Filled 2020-11-15: qty 1

## 2020-11-15 MED ORDER — SODIUM CHLORIDE 0.9 % IV SOLN: Freq: Once | INTRAVENOUS | Status: AC

## 2020-11-15 MED ORDER — SODIUM CHLORIDE 0.9% FLUSH
10.0000 mL | INTRAVENOUS | Status: DC | PRN
  Administered 2020-11-15: 10 mL

## 2020-11-15 NOTE — Patient Instructions (Signed)
[facility]  Discharge Instructions: Thank you for choosing [facility] to provide your oncology and hematology care.  If you have a lab appointment with the [facility], please go directly to the [facility] and check in at the registration area.   Wear comfortable clothing and clothing appropriate for easy access to any Portacath or PICC line.   We strive to give you quality time with your provider. You may need to reschedule your appointment if you arrive late (15 or more minutes).  Arriving late affects you and other patients whose appointments are after yours.  Also, if you miss three or more appointments without notifying the office, you may be dismissed from the clinic at the provider's discretion.      For prescription refill requests, have your pharmacy contact our office and allow 72 hours for refills to be completed.    Today you received the following chemotherapy and/or immunotherapy agents Durvalumab   To help prevent nausea and vomiting after your treatment, we encourage you to take your nausea medication as directed.  BELOW ARE SYMPTOMS THAT SHOULD BE REPORTED IMMEDIATELY: *FEVER GREATER THAN 100.4 F (38 C) OR HIGHER *CHILLS OR SWEATING *NAUSEA AND VOMITING THAT IS NOT CONTROLLED WITH YOUR NAUSEA MEDICATION *UNUSUAL SHORTNESS OF BREATH *UNUSUAL BRUISING OR BLEEDING *URINARY PROBLEMS (pain or burning when urinating, or frequent urination) *BOWEL PROBLEMS (unusual diarrhea, constipation, pain near the anus) TENDERNESS IN MOUTH AND THROAT WITH OR WITHOUT PRESENCE OF ULCERS (sore throat, sores in mouth, or a toothache) UNUSUAL RASH, SWELLING OR PAIN  UNUSUAL VAGINAL DISCHARGE OR ITCHING   Items with * indicate a potential emergency and should be followed up as soon as possible or go to the Emergency Department if any problems should occur.  Please show the CHEMOTHERAPY ALERT CARD or IMMUNOTHERAPY ALERT CARD at check-in to the  Emergency Department and triage nurse.  Should you have questions after your visit or need to cancel or reschedule your appointment, please contact [facility]  Dept: [phone]  and follow the prompts.  Office hours are 8:00 a.m. to 4:30 p.m. Monday - Friday. Please note that voicemails left after 4:00 p.m. may not be returned until the following business day.  We are closed weekends and major holidays. You have access to a nurse at all times for urgent questions. Please call the main number to the clinic Dept: [phone] and follow the prompts.  For any non-urgent questions, you may also contact your provider using MyChart. We now offer e-Visits for anyone 35 and older to request care online for non-urgent symptoms. For details visit [email or web address].   Also download the MyChart app! Go to the app store, search "MyChart", open the app, select [facility], and log in with your MyChart username and password.  Due to Covid, a mask is required upon entering the hospital/clinic. If you do not have a mask, one will be given to you upon arrival. For doctor visits, patients may have 1 support person aged 57 or older with them. For treatment visits, patients cannot have anyone with them due to current Covid guidelines and our immunocompromised population.

## 2020-11-15 NOTE — Progress Notes (Signed)
1413:PT STABLE AT TIME OF DISCHARGE ?

## 2020-11-22 ENCOUNTER — Other Ambulatory Visit: Payer: Self-pay

## 2020-11-22 ENCOUNTER — Inpatient Hospital Stay: Payer: Medicare Other | Attending: Internal Medicine

## 2020-11-22 VITALS — BP 131/55 | HR 84 | Temp 99.3°F | Resp 18 | Ht 59.0 in | Wt 145.2 lb

## 2020-11-22 DIAGNOSIS — D649 Anemia, unspecified: Secondary | ICD-10-CM | POA: Diagnosis not present

## 2020-11-22 DIAGNOSIS — C3431 Malignant neoplasm of lower lobe, right bronchus or lung: Secondary | ICD-10-CM | POA: Diagnosis not present

## 2020-11-22 DIAGNOSIS — Z95828 Presence of other vascular implants and grafts: Secondary | ICD-10-CM

## 2020-11-22 MED ORDER — CYANOCOBALAMIN 1000 MCG/ML IJ SOLN
1000.0000 ug | Freq: Once | INTRAMUSCULAR | Status: AC
  Administered 2020-11-22: 1000 ug via INTRAMUSCULAR
  Filled 2020-11-22: qty 1

## 2020-11-22 NOTE — Patient Instructions (Signed)
Vitamin B12 Injection What is this medication? Vitamin B12 (VAHY tuh min B12) prevents and treats low vitamin B12 levels in your body. It is used in people who do not get enough vitamin B12 from their diet or when their digestive tract does not absorb enough. Vitamin B12 plays an important role in maintaining the health of your nervous system and red blood cells. This medicine may be used for other purposes; ask your health care provider or pharmacist if you have questions. COMMON BRAND NAME(S): B-12 Compliance Kit, B-12 Injection Kit, Cyomin, Dodex, LA-12, Nutri-Twelve, Physicians EZ Use B-12, Primabalt What should I tell my care team before I take this medication? They need to know if you have any of these conditions: Kidney disease Leber's disease Megaloblastic anemia An unusual or allergic reaction to cyanocobalamin, cobalt, other medications, foods, dyes, or preservatives Pregnant or trying to get pregnant Breast-feeding How should I use this medication? This medication is injected into a muscle or deeply under the skin. It is usually given in a clinic or care team's office. However, your care team may teach you how to inject yourself. Follow all instructions. Talk to your care team about the use of this medication in children. Special care may be needed. Overdosage: If you think you have taken too much of this medicine contact a poison control center or emergency room at once. NOTE: This medicine is only for you. Do not share this medicine with others. What if I miss a dose? If you are given your dose at a clinic or care team's office, call to reschedule your appointment. If you give your own injections, and you miss a dose, take it as soon as you can. If it is almost time for your next dose, take only that dose. Do not take double or extra doses. What may interact with this medication? Colchicine Heavy alcohol intake This list may not describe all possible interactions. Give your health  care provider a list of all the medicines, herbs, non-prescription drugs, or dietary supplements you use. Also tell them if you smoke, drink alcohol, or use illegal drugs. Some items may interact with your medicine. What should I watch for while using this medication? Visit your care team regularly. You may need blood work done while you are taking this medication. You may need to follow a special diet. Talk to your care team. Limit your alcohol intake and avoid smoking to get the best benefit. What side effects may I notice from receiving this medication? Side effects that you should report to your care team as soon as possible: Allergic reactions-skin rash, itching, hives, swelling of the face, lips, tongue, or throat Swelling of the ankles, hands, or feet Trouble breathing Side effects that usually do not require medical attention (report to your care team if they continue or are bothersome): Diarrhea This list may not describe all possible side effects. Call your doctor for medical advice about side effects. You may report side effects to FDA at 1-800-FDA-1088. Where should I keep my medication? Keep out of the reach of children. Store at room temperature between 15 and 30 degrees C (59 and 85 degrees F). Protect from light. Throw away any unused medication after the expiration date. NOTE: This sheet is a summary. It may not cover all possible information. If you have questions about this medicine, talk to your doctor, pharmacist, or health care provider.  2022 [employer]/Gold Standard (2020-02-27 11:47:06)

## 2020-11-23 ENCOUNTER — Telehealth: Payer: Self-pay

## 2020-11-23 DIAGNOSIS — J440 Chronic obstructive pulmonary disease with acute lower respiratory infection: Secondary | ICD-10-CM | POA: Diagnosis not present

## 2020-11-23 DIAGNOSIS — Z792 Long term (current) use of antibiotics: Secondary | ICD-10-CM | POA: Diagnosis not present

## 2020-11-23 DIAGNOSIS — Z28310 Unvaccinated for covid-19: Secondary | ICD-10-CM | POA: Diagnosis not present

## 2020-11-23 DIAGNOSIS — J9601 Acute respiratory failure with hypoxia: Secondary | ICD-10-CM | POA: Diagnosis not present

## 2020-11-23 DIAGNOSIS — M1990 Unspecified osteoarthritis, unspecified site: Secondary | ICD-10-CM | POA: Diagnosis not present

## 2020-11-23 DIAGNOSIS — F32A Depression, unspecified: Secondary | ICD-10-CM | POA: Diagnosis not present

## 2020-11-23 DIAGNOSIS — Z7982 Long term (current) use of aspirin: Secondary | ICD-10-CM | POA: Diagnosis not present

## 2020-11-23 DIAGNOSIS — Z8673 Personal history of transient ischemic attack (TIA), and cerebral infarction without residual deficits: Secondary | ICD-10-CM | POA: Diagnosis not present

## 2020-11-23 DIAGNOSIS — R918 Other nonspecific abnormal finding of lung field: Secondary | ICD-10-CM | POA: Diagnosis not present

## 2020-11-23 DIAGNOSIS — R000 Tachycardia, unspecified: Secondary | ICD-10-CM | POA: Diagnosis not present

## 2020-11-23 DIAGNOSIS — R0789 Other chest pain: Secondary | ICD-10-CM | POA: Diagnosis not present

## 2020-11-23 DIAGNOSIS — R079 Chest pain, unspecified: Secondary | ICD-10-CM | POA: Diagnosis not present

## 2020-11-23 DIAGNOSIS — I2510 Atherosclerotic heart disease of native coronary artery without angina pectoris: Secondary | ICD-10-CM | POA: Diagnosis not present

## 2020-11-23 DIAGNOSIS — Z87891 Personal history of nicotine dependence: Secondary | ICD-10-CM | POA: Diagnosis not present

## 2020-11-23 DIAGNOSIS — R1013 Epigastric pain: Secondary | ICD-10-CM | POA: Diagnosis not present

## 2020-11-23 DIAGNOSIS — Z79899 Other long term (current) drug therapy: Secondary | ICD-10-CM | POA: Diagnosis not present

## 2020-11-23 DIAGNOSIS — I517 Cardiomegaly: Secondary | ICD-10-CM | POA: Diagnosis not present

## 2020-11-23 DIAGNOSIS — Z882 Allergy status to sulfonamides status: Secondary | ICD-10-CM | POA: Diagnosis not present

## 2020-11-23 DIAGNOSIS — K219 Gastro-esophageal reflux disease without esophagitis: Secondary | ICD-10-CM | POA: Diagnosis not present

## 2020-11-23 DIAGNOSIS — I480 Paroxysmal atrial fibrillation: Secondary | ICD-10-CM | POA: Diagnosis not present

## 2020-11-23 DIAGNOSIS — I252 Old myocardial infarction: Secondary | ICD-10-CM | POA: Diagnosis not present

## 2020-11-23 DIAGNOSIS — D649 Anemia, unspecified: Secondary | ICD-10-CM | POA: Diagnosis not present

## 2020-11-23 DIAGNOSIS — Z8616 Personal history of COVID-19: Secondary | ICD-10-CM | POA: Diagnosis not present

## 2020-11-23 DIAGNOSIS — Z888 Allergy status to other drugs, medicaments and biological substances status: Secondary | ICD-10-CM | POA: Diagnosis not present

## 2020-11-23 DIAGNOSIS — E876 Hypokalemia: Secondary | ICD-10-CM | POA: Diagnosis not present

## 2020-11-23 DIAGNOSIS — C3490 Malignant neoplasm of unspecified part of unspecified bronchus or lung: Secondary | ICD-10-CM | POA: Diagnosis not present

## 2020-11-23 NOTE — Telephone Encounter (Addendum)
11/26/20 : Pt was admitted to [facility] on 11/23/20. Dr Hinton Rao aware.  11/23/20 : Pt is in ER @ 1250.   Pt is c/o pain in her abdomen, radiating to her back for the last 30 minutes. "She took a NTG, and it made her sick". Pt is a little nauseated. No fever. No diarrhea. "She is less anxious right now". I instructed pt's daughter, Anderson Malta, to take her to the ER right now, or call 911. Chinera verbalized understanding. Dr Hinton Rao notified of above.

## 2020-11-25 DIAGNOSIS — R000 Tachycardia, unspecified: Secondary | ICD-10-CM | POA: Diagnosis not present

## 2020-11-26 ENCOUNTER — Encounter: Payer: Self-pay | Admitting: Oncology

## 2020-11-26 ENCOUNTER — Telehealth: Payer: Self-pay | Admitting: Oncology

## 2020-11-26 ENCOUNTER — Encounter: Payer: Self-pay | Admitting: Hematology and Oncology

## 2020-11-26 NOTE — Telephone Encounter (Signed)
Per 11/7 Staff Msg, patient rescheduled to 11/15 Labs 2:00 pm - Follow Up 2:30 pm - Infusion rescheduled to 11/17 11:00 am

## 2020-11-27 ENCOUNTER — Other Ambulatory Visit: Payer: Medicare Other

## 2020-11-27 ENCOUNTER — Ambulatory Visit: Payer: Medicare Other | Admitting: Oncology

## 2020-11-27 ENCOUNTER — Telehealth: Payer: Self-pay

## 2020-11-27 DIAGNOSIS — R001 Bradycardia, unspecified: Secondary | ICD-10-CM | POA: Diagnosis not present

## 2020-11-27 DIAGNOSIS — R531 Weakness: Secondary | ICD-10-CM | POA: Diagnosis not present

## 2020-11-27 DIAGNOSIS — S40021A Contusion of right upper arm, initial encounter: Secondary | ICD-10-CM | POA: Diagnosis not present

## 2020-11-27 DIAGNOSIS — I498 Other specified cardiac arrhythmias: Secondary | ICD-10-CM | POA: Diagnosis not present

## 2020-11-27 DIAGNOSIS — R911 Solitary pulmonary nodule: Secondary | ICD-10-CM | POA: Diagnosis not present

## 2020-11-27 DIAGNOSIS — I4891 Unspecified atrial fibrillation: Secondary | ICD-10-CM | POA: Diagnosis not present

## 2020-11-27 DIAGNOSIS — R0602 Shortness of breath: Secondary | ICD-10-CM | POA: Diagnosis not present

## 2020-11-27 DIAGNOSIS — I480 Paroxysmal atrial fibrillation: Secondary | ICD-10-CM | POA: Diagnosis not present

## 2020-11-27 DIAGNOSIS — I517 Cardiomegaly: Secondary | ICD-10-CM | POA: Diagnosis not present

## 2020-11-27 DIAGNOSIS — I3139 Other pericardial effusion (noninflammatory): Secondary | ICD-10-CM | POA: Diagnosis not present

## 2020-11-27 DIAGNOSIS — J439 Emphysema, unspecified: Secondary | ICD-10-CM | POA: Diagnosis not present

## 2020-11-27 DIAGNOSIS — E8770 Fluid overload, unspecified: Secondary | ICD-10-CM | POA: Diagnosis not present

## 2020-11-27 DIAGNOSIS — J984 Other disorders of lung: Secondary | ICD-10-CM | POA: Diagnosis not present

## 2020-11-27 DIAGNOSIS — R0600 Dyspnea, unspecified: Secondary | ICD-10-CM | POA: Diagnosis not present

## 2020-11-27 DIAGNOSIS — R0781 Pleurodynia: Secondary | ICD-10-CM | POA: Diagnosis not present

## 2020-11-27 DIAGNOSIS — J9601 Acute respiratory failure with hypoxia: Secondary | ICD-10-CM | POA: Diagnosis not present

## 2020-11-27 DIAGNOSIS — J440 Chronic obstructive pulmonary disease with acute lower respiratory infection: Secondary | ICD-10-CM | POA: Diagnosis not present

## 2020-11-27 DIAGNOSIS — R0603 Acute respiratory distress: Secondary | ICD-10-CM | POA: Diagnosis not present

## 2020-11-27 DIAGNOSIS — M2578 Osteophyte, vertebrae: Secondary | ICD-10-CM | POA: Diagnosis not present

## 2020-11-27 DIAGNOSIS — I2510 Atherosclerotic heart disease of native coronary artery without angina pectoris: Secondary | ICD-10-CM | POA: Diagnosis not present

## 2020-11-27 DIAGNOSIS — R0902 Hypoxemia: Secondary | ICD-10-CM | POA: Diagnosis not present

## 2020-11-27 DIAGNOSIS — I472 Ventricular tachycardia, unspecified: Secondary | ICD-10-CM | POA: Diagnosis not present

## 2020-11-27 DIAGNOSIS — Z20822 Contact with and (suspected) exposure to covid-19: Secondary | ICD-10-CM | POA: Diagnosis not present

## 2020-11-27 DIAGNOSIS — J9690 Respiratory failure, unspecified, unspecified whether with hypoxia or hypercapnia: Secondary | ICD-10-CM | POA: Diagnosis not present

## 2020-11-27 DIAGNOSIS — I672 Cerebral atherosclerosis: Secondary | ICD-10-CM | POA: Diagnosis not present

## 2020-11-27 DIAGNOSIS — Z7401 Bed confinement status: Secondary | ICD-10-CM | POA: Diagnosis not present

## 2020-11-27 DIAGNOSIS — A419 Sepsis, unspecified organism: Secondary | ICD-10-CM | POA: Diagnosis not present

## 2020-11-27 DIAGNOSIS — C3431 Malignant neoplasm of lower lobe, right bronchus or lung: Secondary | ICD-10-CM | POA: Diagnosis not present

## 2020-11-27 DIAGNOSIS — G9341 Metabolic encephalopathy: Secondary | ICD-10-CM | POA: Diagnosis not present

## 2020-11-27 DIAGNOSIS — L89150 Pressure ulcer of sacral region, unstageable: Secondary | ICD-10-CM | POA: Diagnosis not present

## 2020-11-27 DIAGNOSIS — I4892 Unspecified atrial flutter: Secondary | ICD-10-CM | POA: Diagnosis not present

## 2020-11-27 DIAGNOSIS — R918 Other nonspecific abnormal finding of lung field: Secondary | ICD-10-CM | POA: Diagnosis not present

## 2020-11-27 DIAGNOSIS — E7800 Pure hypercholesterolemia, unspecified: Secondary | ICD-10-CM | POA: Diagnosis not present

## 2020-11-27 DIAGNOSIS — J80 Acute respiratory distress syndrome: Secondary | ICD-10-CM | POA: Diagnosis not present

## 2020-11-27 DIAGNOSIS — Z9981 Dependence on supplemental oxygen: Secondary | ICD-10-CM | POA: Diagnosis not present

## 2020-11-27 DIAGNOSIS — E876 Hypokalemia: Secondary | ICD-10-CM | POA: Diagnosis not present

## 2020-11-27 DIAGNOSIS — J90 Pleural effusion, not elsewhere classified: Secondary | ICD-10-CM | POA: Diagnosis not present

## 2020-11-27 DIAGNOSIS — R079 Chest pain, unspecified: Secondary | ICD-10-CM | POA: Diagnosis not present

## 2020-11-27 DIAGNOSIS — S99912A Unspecified injury of left ankle, initial encounter: Secondary | ICD-10-CM | POA: Diagnosis not present

## 2020-11-27 DIAGNOSIS — Z9911 Dependence on respirator [ventilator] status: Secondary | ICD-10-CM | POA: Diagnosis not present

## 2020-11-27 DIAGNOSIS — S20211A Contusion of right front wall of thorax, initial encounter: Secondary | ICD-10-CM | POA: Diagnosis not present

## 2020-11-27 DIAGNOSIS — E785 Hyperlipidemia, unspecified: Secondary | ICD-10-CM | POA: Diagnosis not present

## 2020-11-27 DIAGNOSIS — I21A1 Myocardial infarction type 2: Secondary | ICD-10-CM | POA: Diagnosis not present

## 2020-11-27 DIAGNOSIS — I700 Atherosclerosis of aorta: Secondary | ICD-10-CM | POA: Diagnosis not present

## 2020-11-27 DIAGNOSIS — I110 Hypertensive heart disease with heart failure: Secondary | ICD-10-CM | POA: Diagnosis not present

## 2020-11-27 DIAGNOSIS — E870 Hyperosmolality and hypernatremia: Secondary | ICD-10-CM | POA: Diagnosis not present

## 2020-11-27 DIAGNOSIS — R6521 Severe sepsis with septic shock: Secondary | ICD-10-CM | POA: Diagnosis not present

## 2020-11-27 DIAGNOSIS — Z9989 Dependence on other enabling machines and devices: Secondary | ICD-10-CM | POA: Diagnosis not present

## 2020-11-27 NOTE — Telephone Encounter (Addendum)
Dr Hinton Rao notified of below, and that pt has been admitted to ICU.      Pt's dtr, Chinera, called to make [place] aware that her mom was discharged from hospital yesterday, 11/26/20. However, she had to call 911 today because she couldn't catch her breath. Her oxygen levels were low when they arrived also.

## 2020-11-28 ENCOUNTER — Ambulatory Visit: Payer: Medicare Other

## 2020-11-28 DIAGNOSIS — G9341 Metabolic encephalopathy: Secondary | ICD-10-CM | POA: Diagnosis not present

## 2020-11-28 DIAGNOSIS — R6521 Severe sepsis with septic shock: Secondary | ICD-10-CM | POA: Diagnosis not present

## 2020-11-28 DIAGNOSIS — A419 Sepsis, unspecified organism: Secondary | ICD-10-CM | POA: Diagnosis not present

## 2020-11-28 DIAGNOSIS — Z9989 Dependence on other enabling machines and devices: Secondary | ICD-10-CM | POA: Diagnosis not present

## 2020-11-28 DIAGNOSIS — R079 Chest pain, unspecified: Secondary | ICD-10-CM | POA: Diagnosis not present

## 2020-11-28 DIAGNOSIS — Z9911 Dependence on respirator [ventilator] status: Secondary | ICD-10-CM | POA: Diagnosis not present

## 2020-11-28 DIAGNOSIS — J9601 Acute respiratory failure with hypoxia: Secondary | ICD-10-CM | POA: Diagnosis not present

## 2020-11-28 DIAGNOSIS — I4891 Unspecified atrial fibrillation: Secondary | ICD-10-CM

## 2020-11-28 DIAGNOSIS — R0600 Dyspnea, unspecified: Secondary | ICD-10-CM | POA: Diagnosis not present

## 2020-11-29 ENCOUNTER — Encounter: Payer: Self-pay | Admitting: Hematology and Oncology

## 2020-11-29 ENCOUNTER — Encounter: Payer: Self-pay | Admitting: Oncology

## 2020-11-29 ENCOUNTER — Inpatient Hospital Stay: Payer: Medicare Other

## 2020-11-29 DIAGNOSIS — Z9989 Dependence on other enabling machines and devices: Secondary | ICD-10-CM | POA: Diagnosis not present

## 2020-11-29 DIAGNOSIS — A419 Sepsis, unspecified organism: Secondary | ICD-10-CM | POA: Diagnosis not present

## 2020-11-29 DIAGNOSIS — R6521 Severe sepsis with septic shock: Secondary | ICD-10-CM | POA: Diagnosis not present

## 2020-11-29 DIAGNOSIS — R0600 Dyspnea, unspecified: Secondary | ICD-10-CM | POA: Diagnosis not present

## 2020-11-29 DIAGNOSIS — G9341 Metabolic encephalopathy: Secondary | ICD-10-CM | POA: Diagnosis not present

## 2020-11-29 DIAGNOSIS — J9601 Acute respiratory failure with hypoxia: Secondary | ICD-10-CM | POA: Diagnosis not present

## 2020-11-29 DIAGNOSIS — R079 Chest pain, unspecified: Secondary | ICD-10-CM | POA: Diagnosis not present

## 2020-11-30 DIAGNOSIS — R079 Chest pain, unspecified: Secondary | ICD-10-CM | POA: Diagnosis not present

## 2020-11-30 DIAGNOSIS — G9341 Metabolic encephalopathy: Secondary | ICD-10-CM | POA: Diagnosis not present

## 2020-11-30 DIAGNOSIS — I517 Cardiomegaly: Secondary | ICD-10-CM | POA: Diagnosis not present

## 2020-11-30 DIAGNOSIS — A419 Sepsis, unspecified organism: Secondary | ICD-10-CM | POA: Diagnosis not present

## 2020-11-30 DIAGNOSIS — R0602 Shortness of breath: Secondary | ICD-10-CM | POA: Diagnosis not present

## 2020-11-30 DIAGNOSIS — J9601 Acute respiratory failure with hypoxia: Secondary | ICD-10-CM | POA: Diagnosis not present

## 2020-12-01 DIAGNOSIS — G9341 Metabolic encephalopathy: Secondary | ICD-10-CM | POA: Diagnosis not present

## 2020-12-01 DIAGNOSIS — A419 Sepsis, unspecified organism: Secondary | ICD-10-CM | POA: Diagnosis not present

## 2020-12-02 DIAGNOSIS — G9341 Metabolic encephalopathy: Secondary | ICD-10-CM | POA: Diagnosis not present

## 2020-12-02 DIAGNOSIS — A419 Sepsis, unspecified organism: Secondary | ICD-10-CM | POA: Diagnosis not present

## 2020-12-03 ENCOUNTER — Other Ambulatory Visit: Payer: Self-pay | Admitting: Pharmacist

## 2020-12-03 DIAGNOSIS — A419 Sepsis, unspecified organism: Secondary | ICD-10-CM | POA: Diagnosis not present

## 2020-12-03 DIAGNOSIS — J90 Pleural effusion, not elsewhere classified: Secondary | ICD-10-CM | POA: Diagnosis not present

## 2020-12-03 DIAGNOSIS — R6521 Severe sepsis with septic shock: Secondary | ICD-10-CM | POA: Diagnosis not present

## 2020-12-03 DIAGNOSIS — J9601 Acute respiratory failure with hypoxia: Secondary | ICD-10-CM | POA: Diagnosis not present

## 2020-12-03 DIAGNOSIS — R0600 Dyspnea, unspecified: Secondary | ICD-10-CM | POA: Diagnosis not present

## 2020-12-03 DIAGNOSIS — G9341 Metabolic encephalopathy: Secondary | ICD-10-CM | POA: Diagnosis not present

## 2020-12-03 DIAGNOSIS — Z9989 Dependence on other enabling machines and devices: Secondary | ICD-10-CM | POA: Diagnosis not present

## 2020-12-04 ENCOUNTER — Other Ambulatory Visit: Payer: Medicare Other

## 2020-12-04 ENCOUNTER — Ambulatory Visit: Payer: Medicare Other | Admitting: Hematology and Oncology

## 2020-12-04 DIAGNOSIS — J9601 Acute respiratory failure with hypoxia: Secondary | ICD-10-CM | POA: Diagnosis not present

## 2020-12-04 DIAGNOSIS — R6521 Severe sepsis with septic shock: Secondary | ICD-10-CM | POA: Diagnosis not present

## 2020-12-04 DIAGNOSIS — G9341 Metabolic encephalopathy: Secondary | ICD-10-CM | POA: Diagnosis not present

## 2020-12-04 DIAGNOSIS — J984 Other disorders of lung: Secondary | ICD-10-CM | POA: Diagnosis not present

## 2020-12-04 DIAGNOSIS — A419 Sepsis, unspecified organism: Secondary | ICD-10-CM | POA: Diagnosis not present

## 2020-12-04 DIAGNOSIS — Z9989 Dependence on other enabling machines and devices: Secondary | ICD-10-CM | POA: Diagnosis not present

## 2020-12-04 DIAGNOSIS — R918 Other nonspecific abnormal finding of lung field: Secondary | ICD-10-CM | POA: Diagnosis not present

## 2020-12-04 DIAGNOSIS — I498 Other specified cardiac arrhythmias: Secondary | ICD-10-CM

## 2020-12-04 DIAGNOSIS — J90 Pleural effusion, not elsewhere classified: Secondary | ICD-10-CM | POA: Diagnosis not present

## 2020-12-05 DIAGNOSIS — Z9989 Dependence on other enabling machines and devices: Secondary | ICD-10-CM | POA: Diagnosis not present

## 2020-12-05 DIAGNOSIS — R079 Chest pain, unspecified: Secondary | ICD-10-CM | POA: Diagnosis not present

## 2020-12-05 DIAGNOSIS — R6521 Severe sepsis with septic shock: Secondary | ICD-10-CM | POA: Diagnosis not present

## 2020-12-05 DIAGNOSIS — G9341 Metabolic encephalopathy: Secondary | ICD-10-CM | POA: Diagnosis not present

## 2020-12-05 DIAGNOSIS — A419 Sepsis, unspecified organism: Secondary | ICD-10-CM | POA: Diagnosis not present

## 2020-12-05 DIAGNOSIS — J90 Pleural effusion, not elsewhere classified: Secondary | ICD-10-CM | POA: Diagnosis not present

## 2020-12-05 DIAGNOSIS — J9601 Acute respiratory failure with hypoxia: Secondary | ICD-10-CM | POA: Diagnosis not present

## 2020-12-05 DIAGNOSIS — I498 Other specified cardiac arrhythmias: Secondary | ICD-10-CM | POA: Diagnosis not present

## 2020-12-05 DIAGNOSIS — R0600 Dyspnea, unspecified: Secondary | ICD-10-CM | POA: Diagnosis not present

## 2020-12-06 ENCOUNTER — Ambulatory Visit: Payer: Medicare Other

## 2020-12-06 DIAGNOSIS — A419 Sepsis, unspecified organism: Secondary | ICD-10-CM | POA: Diagnosis not present

## 2020-12-06 DIAGNOSIS — G9341 Metabolic encephalopathy: Secondary | ICD-10-CM | POA: Diagnosis not present

## 2020-12-06 DIAGNOSIS — I517 Cardiomegaly: Secondary | ICD-10-CM | POA: Diagnosis not present

## 2020-12-06 DIAGNOSIS — J9690 Respiratory failure, unspecified, unspecified whether with hypoxia or hypercapnia: Secondary | ICD-10-CM | POA: Diagnosis not present

## 2020-12-06 DIAGNOSIS — J90 Pleural effusion, not elsewhere classified: Secondary | ICD-10-CM | POA: Diagnosis not present

## 2020-12-06 DIAGNOSIS — J9601 Acute respiratory failure with hypoxia: Secondary | ICD-10-CM | POA: Diagnosis not present

## 2020-12-06 DIAGNOSIS — Z9989 Dependence on other enabling machines and devices: Secondary | ICD-10-CM | POA: Diagnosis not present

## 2020-12-06 DIAGNOSIS — R6521 Severe sepsis with septic shock: Secondary | ICD-10-CM | POA: Diagnosis not present

## 2020-12-07 DIAGNOSIS — Z9981 Dependence on supplemental oxygen: Secondary | ICD-10-CM | POA: Diagnosis not present

## 2020-12-07 DIAGNOSIS — A419 Sepsis, unspecified organism: Secondary | ICD-10-CM | POA: Diagnosis not present

## 2020-12-07 DIAGNOSIS — J9601 Acute respiratory failure with hypoxia: Secondary | ICD-10-CM | POA: Diagnosis not present

## 2020-12-07 DIAGNOSIS — R0600 Dyspnea, unspecified: Secondary | ICD-10-CM | POA: Diagnosis not present

## 2020-12-07 DIAGNOSIS — R079 Chest pain, unspecified: Secondary | ICD-10-CM | POA: Diagnosis not present

## 2020-12-07 DIAGNOSIS — G9341 Metabolic encephalopathy: Secondary | ICD-10-CM | POA: Diagnosis not present

## 2020-12-08 DIAGNOSIS — G9341 Metabolic encephalopathy: Secondary | ICD-10-CM | POA: Diagnosis not present

## 2020-12-08 DIAGNOSIS — A419 Sepsis, unspecified organism: Secondary | ICD-10-CM | POA: Diagnosis not present

## 2020-12-09 DIAGNOSIS — E8770 Fluid overload, unspecified: Secondary | ICD-10-CM | POA: Diagnosis not present

## 2020-12-09 DIAGNOSIS — R0902 Hypoxemia: Secondary | ICD-10-CM | POA: Diagnosis not present

## 2020-12-09 DIAGNOSIS — A419 Sepsis, unspecified organism: Secondary | ICD-10-CM | POA: Diagnosis not present

## 2020-12-09 DIAGNOSIS — J90 Pleural effusion, not elsewhere classified: Secondary | ICD-10-CM | POA: Diagnosis not present

## 2020-12-09 DIAGNOSIS — G9341 Metabolic encephalopathy: Secondary | ICD-10-CM | POA: Diagnosis not present

## 2020-12-10 DIAGNOSIS — Z9981 Dependence on supplemental oxygen: Secondary | ICD-10-CM | POA: Diagnosis not present

## 2020-12-10 DIAGNOSIS — J9601 Acute respiratory failure with hypoxia: Secondary | ICD-10-CM | POA: Diagnosis not present

## 2020-12-10 DIAGNOSIS — G9341 Metabolic encephalopathy: Secondary | ICD-10-CM | POA: Diagnosis not present

## 2020-12-10 DIAGNOSIS — A419 Sepsis, unspecified organism: Secondary | ICD-10-CM | POA: Diagnosis not present

## 2020-12-11 DIAGNOSIS — J9601 Acute respiratory failure with hypoxia: Secondary | ICD-10-CM | POA: Diagnosis not present

## 2020-12-11 DIAGNOSIS — A419 Sepsis, unspecified organism: Secondary | ICD-10-CM | POA: Diagnosis not present

## 2020-12-11 DIAGNOSIS — J90 Pleural effusion, not elsewhere classified: Secondary | ICD-10-CM | POA: Diagnosis not present

## 2020-12-11 DIAGNOSIS — Z9981 Dependence on supplemental oxygen: Secondary | ICD-10-CM | POA: Diagnosis not present

## 2020-12-11 DIAGNOSIS — G9341 Metabolic encephalopathy: Secondary | ICD-10-CM | POA: Diagnosis not present

## 2020-12-11 DIAGNOSIS — J9690 Respiratory failure, unspecified, unspecified whether with hypoxia or hypercapnia: Secondary | ICD-10-CM | POA: Diagnosis not present

## 2020-12-12 DIAGNOSIS — G9341 Metabolic encephalopathy: Secondary | ICD-10-CM | POA: Diagnosis not present

## 2020-12-12 DIAGNOSIS — A419 Sepsis, unspecified organism: Secondary | ICD-10-CM | POA: Diagnosis not present

## 2020-12-12 DIAGNOSIS — I4892 Unspecified atrial flutter: Secondary | ICD-10-CM

## 2020-12-13 DIAGNOSIS — A419 Sepsis, unspecified organism: Secondary | ICD-10-CM | POA: Diagnosis not present

## 2020-12-13 DIAGNOSIS — G9341 Metabolic encephalopathy: Secondary | ICD-10-CM | POA: Diagnosis not present

## 2020-12-14 DIAGNOSIS — S99912A Unspecified injury of left ankle, initial encounter: Secondary | ICD-10-CM | POA: Diagnosis not present

## 2020-12-14 DIAGNOSIS — A419 Sepsis, unspecified organism: Secondary | ICD-10-CM | POA: Diagnosis not present

## 2020-12-14 DIAGNOSIS — G9341 Metabolic encephalopathy: Secondary | ICD-10-CM | POA: Diagnosis not present

## 2020-12-15 DIAGNOSIS — G9341 Metabolic encephalopathy: Secondary | ICD-10-CM | POA: Diagnosis not present

## 2020-12-15 DIAGNOSIS — A419 Sepsis, unspecified organism: Secondary | ICD-10-CM | POA: Diagnosis not present

## 2020-12-16 DIAGNOSIS — I498 Other specified cardiac arrhythmias: Secondary | ICD-10-CM

## 2020-12-16 DIAGNOSIS — A419 Sepsis, unspecified organism: Secondary | ICD-10-CM | POA: Diagnosis not present

## 2020-12-16 DIAGNOSIS — G9341 Metabolic encephalopathy: Secondary | ICD-10-CM | POA: Diagnosis not present

## 2020-12-17 DIAGNOSIS — A419 Sepsis, unspecified organism: Secondary | ICD-10-CM | POA: Diagnosis not present

## 2020-12-17 DIAGNOSIS — G9341 Metabolic encephalopathy: Secondary | ICD-10-CM | POA: Diagnosis not present

## 2020-12-18 DIAGNOSIS — A419 Sepsis, unspecified organism: Secondary | ICD-10-CM | POA: Diagnosis not present

## 2020-12-18 DIAGNOSIS — G9341 Metabolic encephalopathy: Secondary | ICD-10-CM | POA: Diagnosis not present

## 2020-12-19 DIAGNOSIS — Z9981 Dependence on supplemental oxygen: Secondary | ICD-10-CM | POA: Diagnosis not present

## 2020-12-19 DIAGNOSIS — G9341 Metabolic encephalopathy: Secondary | ICD-10-CM | POA: Diagnosis not present

## 2020-12-19 DIAGNOSIS — J9601 Acute respiratory failure with hypoxia: Secondary | ICD-10-CM | POA: Diagnosis not present

## 2020-12-19 DIAGNOSIS — A419 Sepsis, unspecified organism: Secondary | ICD-10-CM | POA: Diagnosis not present

## 2020-12-20 DIAGNOSIS — Z7401 Bed confinement status: Secondary | ICD-10-CM | POA: Diagnosis not present

## 2020-12-20 DIAGNOSIS — A419 Sepsis, unspecified organism: Secondary | ICD-10-CM | POA: Diagnosis not present

## 2020-12-20 DIAGNOSIS — R531 Weakness: Secondary | ICD-10-CM | POA: Diagnosis not present

## 2020-12-20 DIAGNOSIS — I480 Paroxysmal atrial fibrillation: Secondary | ICD-10-CM | POA: Diagnosis not present

## 2020-12-20 DIAGNOSIS — Z9981 Dependence on supplemental oxygen: Secondary | ICD-10-CM | POA: Diagnosis not present

## 2020-12-20 DIAGNOSIS — G9341 Metabolic encephalopathy: Secondary | ICD-10-CM | POA: Diagnosis not present

## 2020-12-20 DIAGNOSIS — J9601 Acute respiratory failure with hypoxia: Secondary | ICD-10-CM | POA: Diagnosis not present

## 2020-12-21 ENCOUNTER — Encounter: Payer: Self-pay | Admitting: Oncology

## 2020-12-22 DIAGNOSIS — F32A Depression, unspecified: Secondary | ICD-10-CM | POA: Diagnosis not present

## 2020-12-22 DIAGNOSIS — K219 Gastro-esophageal reflux disease without esophagitis: Secondary | ICD-10-CM | POA: Diagnosis not present

## 2020-12-22 DIAGNOSIS — E7800 Pure hypercholesterolemia, unspecified: Secondary | ICD-10-CM | POA: Diagnosis not present

## 2020-12-22 DIAGNOSIS — L89150 Pressure ulcer of sacral region, unstageable: Secondary | ICD-10-CM | POA: Diagnosis not present

## 2020-12-22 DIAGNOSIS — I2510 Atherosclerotic heart disease of native coronary artery without angina pectoris: Secondary | ICD-10-CM | POA: Diagnosis not present

## 2020-12-22 DIAGNOSIS — I700 Atherosclerosis of aorta: Secondary | ICD-10-CM | POA: Diagnosis not present

## 2020-12-22 DIAGNOSIS — I222 Subsequent non-ST elevation (NSTEMI) myocardial infarction: Secondary | ICD-10-CM | POA: Diagnosis not present

## 2020-12-22 DIAGNOSIS — R6521 Severe sepsis with septic shock: Secondary | ICD-10-CM | POA: Diagnosis not present

## 2020-12-22 DIAGNOSIS — I110 Hypertensive heart disease with heart failure: Secondary | ICD-10-CM | POA: Diagnosis not present

## 2020-12-22 DIAGNOSIS — D649 Anemia, unspecified: Secondary | ICD-10-CM | POA: Diagnosis not present

## 2020-12-22 DIAGNOSIS — J439 Emphysema, unspecified: Secondary | ICD-10-CM | POA: Diagnosis not present

## 2020-12-22 DIAGNOSIS — K5790 Diverticulosis of intestine, part unspecified, without perforation or abscess without bleeding: Secondary | ICD-10-CM | POA: Diagnosis not present

## 2020-12-22 DIAGNOSIS — J9601 Acute respiratory failure with hypoxia: Secondary | ICD-10-CM | POA: Diagnosis not present

## 2020-12-22 DIAGNOSIS — E876 Hypokalemia: Secondary | ICD-10-CM | POA: Diagnosis not present

## 2020-12-22 DIAGNOSIS — G9341 Metabolic encephalopathy: Secondary | ICD-10-CM | POA: Diagnosis not present

## 2020-12-22 DIAGNOSIS — A419 Sepsis, unspecified organism: Secondary | ICD-10-CM | POA: Diagnosis not present

## 2020-12-22 DIAGNOSIS — N390 Urinary tract infection, site not specified: Secondary | ICD-10-CM | POA: Diagnosis not present

## 2020-12-22 DIAGNOSIS — Z7982 Long term (current) use of aspirin: Secondary | ICD-10-CM | POA: Diagnosis not present

## 2020-12-22 DIAGNOSIS — T83511A Infection and inflammatory reaction due to indwelling urethral catheter, initial encounter: Secondary | ICD-10-CM | POA: Diagnosis not present

## 2020-12-22 DIAGNOSIS — I5030 Unspecified diastolic (congestive) heart failure: Secondary | ICD-10-CM | POA: Diagnosis not present

## 2020-12-22 DIAGNOSIS — I480 Paroxysmal atrial fibrillation: Secondary | ICD-10-CM | POA: Diagnosis not present

## 2020-12-22 DIAGNOSIS — M1990 Unspecified osteoarthritis, unspecified site: Secondary | ICD-10-CM | POA: Diagnosis not present

## 2020-12-23 DIAGNOSIS — R1010 Upper abdominal pain, unspecified: Secondary | ICD-10-CM | POA: Diagnosis not present

## 2020-12-23 DIAGNOSIS — R0789 Other chest pain: Secondary | ICD-10-CM | POA: Diagnosis not present

## 2020-12-23 DIAGNOSIS — I110 Hypertensive heart disease with heart failure: Secondary | ICD-10-CM | POA: Diagnosis not present

## 2020-12-23 DIAGNOSIS — T83511A Infection and inflammatory reaction due to indwelling urethral catheter, initial encounter: Secondary | ICD-10-CM | POA: Diagnosis not present

## 2020-12-23 DIAGNOSIS — I509 Heart failure, unspecified: Secondary | ICD-10-CM | POA: Diagnosis not present

## 2020-12-23 DIAGNOSIS — L89159 Pressure ulcer of sacral region, unspecified stage: Secondary | ICD-10-CM | POA: Diagnosis not present

## 2020-12-23 DIAGNOSIS — R0902 Hypoxemia: Secondary | ICD-10-CM | POA: Diagnosis not present

## 2020-12-23 DIAGNOSIS — C3490 Malignant neoplasm of unspecified part of unspecified bronchus or lung: Secondary | ICD-10-CM | POA: Diagnosis not present

## 2020-12-23 DIAGNOSIS — Z7401 Bed confinement status: Secondary | ICD-10-CM | POA: Diagnosis not present

## 2020-12-23 DIAGNOSIS — R079 Chest pain, unspecified: Secondary | ICD-10-CM | POA: Diagnosis not present

## 2020-12-23 DIAGNOSIS — Z743 Need for continuous supervision: Secondary | ICD-10-CM | POA: Diagnosis not present

## 2020-12-23 DIAGNOSIS — Z792 Long term (current) use of antibiotics: Secondary | ICD-10-CM | POA: Diagnosis not present

## 2020-12-24 DIAGNOSIS — J9601 Acute respiratory failure with hypoxia: Secondary | ICD-10-CM | POA: Diagnosis not present

## 2020-12-24 DIAGNOSIS — I700 Atherosclerosis of aorta: Secondary | ICD-10-CM | POA: Diagnosis not present

## 2020-12-24 DIAGNOSIS — K219 Gastro-esophageal reflux disease without esophagitis: Secondary | ICD-10-CM | POA: Diagnosis not present

## 2020-12-24 DIAGNOSIS — E7800 Pure hypercholesterolemia, unspecified: Secondary | ICD-10-CM | POA: Diagnosis not present

## 2020-12-24 DIAGNOSIS — I110 Hypertensive heart disease with heart failure: Secondary | ICD-10-CM | POA: Diagnosis not present

## 2020-12-24 DIAGNOSIS — F32A Depression, unspecified: Secondary | ICD-10-CM | POA: Diagnosis not present

## 2020-12-24 DIAGNOSIS — I222 Subsequent non-ST elevation (NSTEMI) myocardial infarction: Secondary | ICD-10-CM | POA: Diagnosis not present

## 2020-12-24 DIAGNOSIS — I480 Paroxysmal atrial fibrillation: Secondary | ICD-10-CM | POA: Diagnosis not present

## 2020-12-24 DIAGNOSIS — Z7982 Long term (current) use of aspirin: Secondary | ICD-10-CM | POA: Diagnosis not present

## 2020-12-24 DIAGNOSIS — R6521 Severe sepsis with septic shock: Secondary | ICD-10-CM | POA: Diagnosis not present

## 2020-12-24 DIAGNOSIS — J439 Emphysema, unspecified: Secondary | ICD-10-CM | POA: Diagnosis not present

## 2020-12-24 DIAGNOSIS — A419 Sepsis, unspecified organism: Secondary | ICD-10-CM | POA: Diagnosis not present

## 2020-12-24 DIAGNOSIS — I5030 Unspecified diastolic (congestive) heart failure: Secondary | ICD-10-CM | POA: Diagnosis not present

## 2020-12-24 DIAGNOSIS — D649 Anemia, unspecified: Secondary | ICD-10-CM | POA: Diagnosis not present

## 2020-12-24 DIAGNOSIS — K5790 Diverticulosis of intestine, part unspecified, without perforation or abscess without bleeding: Secondary | ICD-10-CM | POA: Diagnosis not present

## 2020-12-24 DIAGNOSIS — M1990 Unspecified osteoarthritis, unspecified site: Secondary | ICD-10-CM | POA: Diagnosis not present

## 2020-12-24 DIAGNOSIS — L89150 Pressure ulcer of sacral region, unstageable: Secondary | ICD-10-CM | POA: Diagnosis not present

## 2020-12-24 DIAGNOSIS — T83511A Infection and inflammatory reaction due to indwelling urethral catheter, initial encounter: Secondary | ICD-10-CM | POA: Diagnosis not present

## 2020-12-24 DIAGNOSIS — G9341 Metabolic encephalopathy: Secondary | ICD-10-CM | POA: Diagnosis not present

## 2020-12-24 DIAGNOSIS — E876 Hypokalemia: Secondary | ICD-10-CM | POA: Diagnosis not present

## 2020-12-24 DIAGNOSIS — N390 Urinary tract infection, site not specified: Secondary | ICD-10-CM | POA: Diagnosis not present

## 2020-12-24 DIAGNOSIS — I2510 Atherosclerotic heart disease of native coronary artery without angina pectoris: Secondary | ICD-10-CM | POA: Diagnosis not present

## 2020-12-25 DIAGNOSIS — L89153 Pressure ulcer of sacral region, stage 3: Secondary | ICD-10-CM | POA: Diagnosis not present

## 2020-12-25 DIAGNOSIS — E7800 Pure hypercholesterolemia, unspecified: Secondary | ICD-10-CM | POA: Diagnosis not present

## 2020-12-25 DIAGNOSIS — I700 Atherosclerosis of aorta: Secondary | ICD-10-CM | POA: Diagnosis not present

## 2020-12-25 DIAGNOSIS — N390 Urinary tract infection, site not specified: Secondary | ICD-10-CM | POA: Diagnosis not present

## 2020-12-25 DIAGNOSIS — I110 Hypertensive heart disease with heart failure: Secondary | ICD-10-CM | POA: Diagnosis not present

## 2020-12-25 DIAGNOSIS — J439 Emphysema, unspecified: Secondary | ICD-10-CM | POA: Diagnosis not present

## 2020-12-25 DIAGNOSIS — I5030 Unspecified diastolic (congestive) heart failure: Secondary | ICD-10-CM | POA: Diagnosis not present

## 2020-12-25 DIAGNOSIS — G9341 Metabolic encephalopathy: Secondary | ICD-10-CM | POA: Diagnosis not present

## 2020-12-25 DIAGNOSIS — M1990 Unspecified osteoarthritis, unspecified site: Secondary | ICD-10-CM | POA: Diagnosis not present

## 2020-12-25 DIAGNOSIS — L89150 Pressure ulcer of sacral region, unstageable: Secondary | ICD-10-CM | POA: Diagnosis not present

## 2020-12-25 DIAGNOSIS — A419 Sepsis, unspecified organism: Secondary | ICD-10-CM | POA: Diagnosis not present

## 2020-12-25 DIAGNOSIS — Z7982 Long term (current) use of aspirin: Secondary | ICD-10-CM | POA: Diagnosis not present

## 2020-12-25 DIAGNOSIS — K5790 Diverticulosis of intestine, part unspecified, without perforation or abscess without bleeding: Secondary | ICD-10-CM | POA: Diagnosis not present

## 2020-12-25 DIAGNOSIS — I222 Subsequent non-ST elevation (NSTEMI) myocardial infarction: Secondary | ICD-10-CM | POA: Diagnosis not present

## 2020-12-25 DIAGNOSIS — I480 Paroxysmal atrial fibrillation: Secondary | ICD-10-CM | POA: Diagnosis not present

## 2020-12-25 DIAGNOSIS — J9601 Acute respiratory failure with hypoxia: Secondary | ICD-10-CM | POA: Diagnosis not present

## 2020-12-25 DIAGNOSIS — F32A Depression, unspecified: Secondary | ICD-10-CM | POA: Diagnosis not present

## 2020-12-25 DIAGNOSIS — T83511A Infection and inflammatory reaction due to indwelling urethral catheter, initial encounter: Secondary | ICD-10-CM | POA: Diagnosis not present

## 2020-12-25 DIAGNOSIS — K219 Gastro-esophageal reflux disease without esophagitis: Secondary | ICD-10-CM | POA: Diagnosis not present

## 2020-12-25 DIAGNOSIS — R6521 Severe sepsis with septic shock: Secondary | ICD-10-CM | POA: Diagnosis not present

## 2020-12-25 DIAGNOSIS — D649 Anemia, unspecified: Secondary | ICD-10-CM | POA: Diagnosis not present

## 2020-12-25 DIAGNOSIS — E876 Hypokalemia: Secondary | ICD-10-CM | POA: Diagnosis not present

## 2020-12-25 DIAGNOSIS — Z79899 Other long term (current) drug therapy: Secondary | ICD-10-CM | POA: Diagnosis not present

## 2020-12-25 DIAGNOSIS — I2510 Atherosclerotic heart disease of native coronary artery without angina pectoris: Secondary | ICD-10-CM | POA: Diagnosis not present

## 2020-12-26 ENCOUNTER — Encounter: Payer: Self-pay | Admitting: Oncology

## 2020-12-27 DIAGNOSIS — L89150 Pressure ulcer of sacral region, unstageable: Secondary | ICD-10-CM | POA: Diagnosis not present

## 2020-12-27 DIAGNOSIS — J9601 Acute respiratory failure with hypoxia: Secondary | ICD-10-CM | POA: Diagnosis not present

## 2020-12-27 DIAGNOSIS — R6521 Severe sepsis with septic shock: Secondary | ICD-10-CM | POA: Diagnosis not present

## 2020-12-27 DIAGNOSIS — K5790 Diverticulosis of intestine, part unspecified, without perforation or abscess without bleeding: Secondary | ICD-10-CM | POA: Diagnosis not present

## 2020-12-27 DIAGNOSIS — I222 Subsequent non-ST elevation (NSTEMI) myocardial infarction: Secondary | ICD-10-CM | POA: Diagnosis not present

## 2020-12-27 DIAGNOSIS — N390 Urinary tract infection, site not specified: Secondary | ICD-10-CM | POA: Diagnosis not present

## 2020-12-27 DIAGNOSIS — I5030 Unspecified diastolic (congestive) heart failure: Secondary | ICD-10-CM | POA: Diagnosis not present

## 2020-12-27 DIAGNOSIS — T83511A Infection and inflammatory reaction due to indwelling urethral catheter, initial encounter: Secondary | ICD-10-CM | POA: Diagnosis not present

## 2020-12-27 DIAGNOSIS — Z7982 Long term (current) use of aspirin: Secondary | ICD-10-CM | POA: Diagnosis not present

## 2020-12-27 DIAGNOSIS — G9341 Metabolic encephalopathy: Secondary | ICD-10-CM | POA: Diagnosis not present

## 2020-12-27 DIAGNOSIS — E7800 Pure hypercholesterolemia, unspecified: Secondary | ICD-10-CM | POA: Diagnosis not present

## 2020-12-27 DIAGNOSIS — K219 Gastro-esophageal reflux disease without esophagitis: Secondary | ICD-10-CM | POA: Diagnosis not present

## 2020-12-27 DIAGNOSIS — A419 Sepsis, unspecified organism: Secondary | ICD-10-CM | POA: Diagnosis not present

## 2020-12-27 DIAGNOSIS — I480 Paroxysmal atrial fibrillation: Secondary | ICD-10-CM | POA: Diagnosis not present

## 2020-12-27 DIAGNOSIS — I700 Atherosclerosis of aorta: Secondary | ICD-10-CM | POA: Diagnosis not present

## 2020-12-27 DIAGNOSIS — J439 Emphysema, unspecified: Secondary | ICD-10-CM | POA: Diagnosis not present

## 2020-12-27 DIAGNOSIS — F32A Depression, unspecified: Secondary | ICD-10-CM | POA: Diagnosis not present

## 2020-12-27 DIAGNOSIS — E876 Hypokalemia: Secondary | ICD-10-CM | POA: Diagnosis not present

## 2020-12-27 DIAGNOSIS — I2510 Atherosclerotic heart disease of native coronary artery without angina pectoris: Secondary | ICD-10-CM | POA: Diagnosis not present

## 2020-12-27 DIAGNOSIS — I110 Hypertensive heart disease with heart failure: Secondary | ICD-10-CM | POA: Diagnosis not present

## 2020-12-27 DIAGNOSIS — D649 Anemia, unspecified: Secondary | ICD-10-CM | POA: Diagnosis not present

## 2020-12-27 DIAGNOSIS — M1990 Unspecified osteoarthritis, unspecified site: Secondary | ICD-10-CM | POA: Diagnosis not present

## 2020-12-28 DIAGNOSIS — R6521 Severe sepsis with septic shock: Secondary | ICD-10-CM | POA: Diagnosis not present

## 2020-12-28 DIAGNOSIS — F32A Depression, unspecified: Secondary | ICD-10-CM | POA: Diagnosis not present

## 2020-12-28 DIAGNOSIS — T83511A Infection and inflammatory reaction due to indwelling urethral catheter, initial encounter: Secondary | ICD-10-CM | POA: Diagnosis not present

## 2020-12-28 DIAGNOSIS — J439 Emphysema, unspecified: Secondary | ICD-10-CM | POA: Diagnosis not present

## 2020-12-28 DIAGNOSIS — I5030 Unspecified diastolic (congestive) heart failure: Secondary | ICD-10-CM | POA: Diagnosis not present

## 2020-12-28 DIAGNOSIS — I480 Paroxysmal atrial fibrillation: Secondary | ICD-10-CM | POA: Diagnosis not present

## 2020-12-28 DIAGNOSIS — I222 Subsequent non-ST elevation (NSTEMI) myocardial infarction: Secondary | ICD-10-CM | POA: Diagnosis not present

## 2020-12-28 DIAGNOSIS — I110 Hypertensive heart disease with heart failure: Secondary | ICD-10-CM | POA: Diagnosis not present

## 2020-12-28 DIAGNOSIS — I700 Atherosclerosis of aorta: Secondary | ICD-10-CM | POA: Diagnosis not present

## 2020-12-28 DIAGNOSIS — G9341 Metabolic encephalopathy: Secondary | ICD-10-CM | POA: Diagnosis not present

## 2020-12-28 DIAGNOSIS — E7800 Pure hypercholesterolemia, unspecified: Secondary | ICD-10-CM | POA: Diagnosis not present

## 2020-12-28 DIAGNOSIS — K5790 Diverticulosis of intestine, part unspecified, without perforation or abscess without bleeding: Secondary | ICD-10-CM | POA: Diagnosis not present

## 2020-12-28 DIAGNOSIS — M1990 Unspecified osteoarthritis, unspecified site: Secondary | ICD-10-CM | POA: Diagnosis not present

## 2020-12-28 DIAGNOSIS — A419 Sepsis, unspecified organism: Secondary | ICD-10-CM | POA: Diagnosis not present

## 2020-12-28 DIAGNOSIS — L89150 Pressure ulcer of sacral region, unstageable: Secondary | ICD-10-CM | POA: Diagnosis not present

## 2020-12-28 DIAGNOSIS — Z7982 Long term (current) use of aspirin: Secondary | ICD-10-CM | POA: Diagnosis not present

## 2020-12-28 DIAGNOSIS — I2510 Atherosclerotic heart disease of native coronary artery without angina pectoris: Secondary | ICD-10-CM | POA: Diagnosis not present

## 2020-12-28 DIAGNOSIS — N390 Urinary tract infection, site not specified: Secondary | ICD-10-CM | POA: Diagnosis not present

## 2020-12-28 DIAGNOSIS — E876 Hypokalemia: Secondary | ICD-10-CM | POA: Diagnosis not present

## 2020-12-28 DIAGNOSIS — K219 Gastro-esophageal reflux disease without esophagitis: Secondary | ICD-10-CM | POA: Diagnosis not present

## 2020-12-28 DIAGNOSIS — D649 Anemia, unspecified: Secondary | ICD-10-CM | POA: Diagnosis not present

## 2020-12-28 DIAGNOSIS — J9601 Acute respiratory failure with hypoxia: Secondary | ICD-10-CM | POA: Diagnosis not present

## 2020-12-31 DIAGNOSIS — K5790 Diverticulosis of intestine, part unspecified, without perforation or abscess without bleeding: Secondary | ICD-10-CM | POA: Diagnosis not present

## 2020-12-31 DIAGNOSIS — J9601 Acute respiratory failure with hypoxia: Secondary | ICD-10-CM | POA: Diagnosis not present

## 2020-12-31 DIAGNOSIS — L89150 Pressure ulcer of sacral region, unstageable: Secondary | ICD-10-CM | POA: Diagnosis not present

## 2020-12-31 DIAGNOSIS — I700 Atherosclerosis of aorta: Secondary | ICD-10-CM | POA: Diagnosis not present

## 2020-12-31 DIAGNOSIS — E876 Hypokalemia: Secondary | ICD-10-CM | POA: Diagnosis not present

## 2020-12-31 DIAGNOSIS — J439 Emphysema, unspecified: Secondary | ICD-10-CM | POA: Diagnosis not present

## 2020-12-31 DIAGNOSIS — I480 Paroxysmal atrial fibrillation: Secondary | ICD-10-CM | POA: Diagnosis not present

## 2020-12-31 DIAGNOSIS — I5030 Unspecified diastolic (congestive) heart failure: Secondary | ICD-10-CM | POA: Diagnosis not present

## 2020-12-31 DIAGNOSIS — I2510 Atherosclerotic heart disease of native coronary artery without angina pectoris: Secondary | ICD-10-CM | POA: Diagnosis not present

## 2020-12-31 DIAGNOSIS — Z7982 Long term (current) use of aspirin: Secondary | ICD-10-CM | POA: Diagnosis not present

## 2020-12-31 DIAGNOSIS — I222 Subsequent non-ST elevation (NSTEMI) myocardial infarction: Secondary | ICD-10-CM | POA: Diagnosis not present

## 2020-12-31 DIAGNOSIS — R6521 Severe sepsis with septic shock: Secondary | ICD-10-CM | POA: Diagnosis not present

## 2020-12-31 DIAGNOSIS — F32A Depression, unspecified: Secondary | ICD-10-CM | POA: Diagnosis not present

## 2020-12-31 DIAGNOSIS — T83511A Infection and inflammatory reaction due to indwelling urethral catheter, initial encounter: Secondary | ICD-10-CM | POA: Diagnosis not present

## 2020-12-31 DIAGNOSIS — A419 Sepsis, unspecified organism: Secondary | ICD-10-CM | POA: Diagnosis not present

## 2020-12-31 DIAGNOSIS — G9341 Metabolic encephalopathy: Secondary | ICD-10-CM | POA: Diagnosis not present

## 2020-12-31 DIAGNOSIS — N390 Urinary tract infection, site not specified: Secondary | ICD-10-CM | POA: Diagnosis not present

## 2020-12-31 DIAGNOSIS — K219 Gastro-esophageal reflux disease without esophagitis: Secondary | ICD-10-CM | POA: Diagnosis not present

## 2020-12-31 DIAGNOSIS — M1990 Unspecified osteoarthritis, unspecified site: Secondary | ICD-10-CM | POA: Diagnosis not present

## 2020-12-31 DIAGNOSIS — E7800 Pure hypercholesterolemia, unspecified: Secondary | ICD-10-CM | POA: Diagnosis not present

## 2020-12-31 DIAGNOSIS — D649 Anemia, unspecified: Secondary | ICD-10-CM | POA: Diagnosis not present

## 2020-12-31 DIAGNOSIS — I110 Hypertensive heart disease with heart failure: Secondary | ICD-10-CM | POA: Diagnosis not present

## 2021-01-01 ENCOUNTER — Other Ambulatory Visit: Payer: Self-pay | Admitting: Oncology

## 2021-01-01 DIAGNOSIS — N390 Urinary tract infection, site not specified: Secondary | ICD-10-CM | POA: Diagnosis not present

## 2021-01-01 DIAGNOSIS — K219 Gastro-esophageal reflux disease without esophagitis: Secondary | ICD-10-CM | POA: Diagnosis not present

## 2021-01-01 DIAGNOSIS — E876 Hypokalemia: Secondary | ICD-10-CM | POA: Diagnosis not present

## 2021-01-01 DIAGNOSIS — Z7982 Long term (current) use of aspirin: Secondary | ICD-10-CM | POA: Diagnosis not present

## 2021-01-01 DIAGNOSIS — I700 Atherosclerosis of aorta: Secondary | ICD-10-CM | POA: Diagnosis not present

## 2021-01-01 DIAGNOSIS — G9341 Metabolic encephalopathy: Secondary | ICD-10-CM | POA: Diagnosis not present

## 2021-01-01 DIAGNOSIS — A419 Sepsis, unspecified organism: Secondary | ICD-10-CM | POA: Diagnosis not present

## 2021-01-01 DIAGNOSIS — T83511A Infection and inflammatory reaction due to indwelling urethral catheter, initial encounter: Secondary | ICD-10-CM | POA: Diagnosis not present

## 2021-01-01 DIAGNOSIS — J9601 Acute respiratory failure with hypoxia: Secondary | ICD-10-CM | POA: Diagnosis not present

## 2021-01-01 DIAGNOSIS — I110 Hypertensive heart disease with heart failure: Secondary | ICD-10-CM | POA: Diagnosis not present

## 2021-01-01 DIAGNOSIS — L89150 Pressure ulcer of sacral region, unstageable: Secondary | ICD-10-CM | POA: Diagnosis not present

## 2021-01-01 DIAGNOSIS — J439 Emphysema, unspecified: Secondary | ICD-10-CM | POA: Diagnosis not present

## 2021-01-01 DIAGNOSIS — I222 Subsequent non-ST elevation (NSTEMI) myocardial infarction: Secondary | ICD-10-CM | POA: Diagnosis not present

## 2021-01-01 DIAGNOSIS — E7800 Pure hypercholesterolemia, unspecified: Secondary | ICD-10-CM | POA: Diagnosis not present

## 2021-01-01 DIAGNOSIS — I2510 Atherosclerotic heart disease of native coronary artery without angina pectoris: Secondary | ICD-10-CM | POA: Diagnosis not present

## 2021-01-01 DIAGNOSIS — K5790 Diverticulosis of intestine, part unspecified, without perforation or abscess without bleeding: Secondary | ICD-10-CM | POA: Diagnosis not present

## 2021-01-01 DIAGNOSIS — D649 Anemia, unspecified: Secondary | ICD-10-CM | POA: Diagnosis not present

## 2021-01-01 DIAGNOSIS — I480 Paroxysmal atrial fibrillation: Secondary | ICD-10-CM | POA: Diagnosis not present

## 2021-01-01 DIAGNOSIS — F32A Depression, unspecified: Secondary | ICD-10-CM | POA: Diagnosis not present

## 2021-01-01 DIAGNOSIS — I5030 Unspecified diastolic (congestive) heart failure: Secondary | ICD-10-CM | POA: Diagnosis not present

## 2021-01-01 DIAGNOSIS — C3431 Malignant neoplasm of lower lobe, right bronchus or lung: Secondary | ICD-10-CM

## 2021-01-01 DIAGNOSIS — M1990 Unspecified osteoarthritis, unspecified site: Secondary | ICD-10-CM | POA: Diagnosis not present

## 2021-01-01 DIAGNOSIS — R6521 Severe sepsis with septic shock: Secondary | ICD-10-CM | POA: Diagnosis not present

## 2021-01-02 DIAGNOSIS — J9601 Acute respiratory failure with hypoxia: Secondary | ICD-10-CM | POA: Diagnosis not present

## 2021-01-02 DIAGNOSIS — K5790 Diverticulosis of intestine, part unspecified, without perforation or abscess without bleeding: Secondary | ICD-10-CM | POA: Diagnosis not present

## 2021-01-02 DIAGNOSIS — L89150 Pressure ulcer of sacral region, unstageable: Secondary | ICD-10-CM | POA: Diagnosis not present

## 2021-01-02 DIAGNOSIS — J439 Emphysema, unspecified: Secondary | ICD-10-CM | POA: Diagnosis not present

## 2021-01-02 DIAGNOSIS — M1990 Unspecified osteoarthritis, unspecified site: Secondary | ICD-10-CM | POA: Diagnosis not present

## 2021-01-02 DIAGNOSIS — D649 Anemia, unspecified: Secondary | ICD-10-CM | POA: Diagnosis not present

## 2021-01-02 DIAGNOSIS — G9341 Metabolic encephalopathy: Secondary | ICD-10-CM | POA: Diagnosis not present

## 2021-01-02 DIAGNOSIS — A419 Sepsis, unspecified organism: Secondary | ICD-10-CM | POA: Diagnosis not present

## 2021-01-02 DIAGNOSIS — N390 Urinary tract infection, site not specified: Secondary | ICD-10-CM | POA: Diagnosis not present

## 2021-01-02 DIAGNOSIS — F32A Depression, unspecified: Secondary | ICD-10-CM | POA: Diagnosis not present

## 2021-01-02 DIAGNOSIS — I110 Hypertensive heart disease with heart failure: Secondary | ICD-10-CM | POA: Diagnosis not present

## 2021-01-02 DIAGNOSIS — T83511A Infection and inflammatory reaction due to indwelling urethral catheter, initial encounter: Secondary | ICD-10-CM | POA: Diagnosis not present

## 2021-01-02 DIAGNOSIS — E7800 Pure hypercholesterolemia, unspecified: Secondary | ICD-10-CM | POA: Diagnosis not present

## 2021-01-02 DIAGNOSIS — I222 Subsequent non-ST elevation (NSTEMI) myocardial infarction: Secondary | ICD-10-CM | POA: Diagnosis not present

## 2021-01-02 DIAGNOSIS — R6521 Severe sepsis with septic shock: Secondary | ICD-10-CM | POA: Diagnosis not present

## 2021-01-02 DIAGNOSIS — I700 Atherosclerosis of aorta: Secondary | ICD-10-CM | POA: Diagnosis not present

## 2021-01-02 DIAGNOSIS — I480 Paroxysmal atrial fibrillation: Secondary | ICD-10-CM | POA: Diagnosis not present

## 2021-01-02 DIAGNOSIS — I2510 Atherosclerotic heart disease of native coronary artery without angina pectoris: Secondary | ICD-10-CM | POA: Diagnosis not present

## 2021-01-02 DIAGNOSIS — Z7982 Long term (current) use of aspirin: Secondary | ICD-10-CM | POA: Diagnosis not present

## 2021-01-02 DIAGNOSIS — K219 Gastro-esophageal reflux disease without esophagitis: Secondary | ICD-10-CM | POA: Diagnosis not present

## 2021-01-02 DIAGNOSIS — I5030 Unspecified diastolic (congestive) heart failure: Secondary | ICD-10-CM | POA: Diagnosis not present

## 2021-01-02 DIAGNOSIS — E876 Hypokalemia: Secondary | ICD-10-CM | POA: Diagnosis not present

## 2021-01-03 DIAGNOSIS — F32A Depression, unspecified: Secondary | ICD-10-CM | POA: Diagnosis not present

## 2021-01-03 DIAGNOSIS — L89150 Pressure ulcer of sacral region, unstageable: Secondary | ICD-10-CM | POA: Diagnosis not present

## 2021-01-03 DIAGNOSIS — K5790 Diverticulosis of intestine, part unspecified, without perforation or abscess without bleeding: Secondary | ICD-10-CM | POA: Diagnosis not present

## 2021-01-03 DIAGNOSIS — D649 Anemia, unspecified: Secondary | ICD-10-CM | POA: Diagnosis not present

## 2021-01-03 DIAGNOSIS — A419 Sepsis, unspecified organism: Secondary | ICD-10-CM | POA: Diagnosis not present

## 2021-01-03 DIAGNOSIS — N390 Urinary tract infection, site not specified: Secondary | ICD-10-CM | POA: Diagnosis not present

## 2021-01-03 DIAGNOSIS — I700 Atherosclerosis of aorta: Secondary | ICD-10-CM | POA: Diagnosis not present

## 2021-01-03 DIAGNOSIS — Z7982 Long term (current) use of aspirin: Secondary | ICD-10-CM | POA: Diagnosis not present

## 2021-01-03 DIAGNOSIS — I222 Subsequent non-ST elevation (NSTEMI) myocardial infarction: Secondary | ICD-10-CM | POA: Diagnosis not present

## 2021-01-03 DIAGNOSIS — G9341 Metabolic encephalopathy: Secondary | ICD-10-CM | POA: Diagnosis not present

## 2021-01-03 DIAGNOSIS — J439 Emphysema, unspecified: Secondary | ICD-10-CM | POA: Diagnosis not present

## 2021-01-03 DIAGNOSIS — R6521 Severe sepsis with septic shock: Secondary | ICD-10-CM | POA: Diagnosis not present

## 2021-01-03 DIAGNOSIS — I110 Hypertensive heart disease with heart failure: Secondary | ICD-10-CM | POA: Diagnosis not present

## 2021-01-03 DIAGNOSIS — I480 Paroxysmal atrial fibrillation: Secondary | ICD-10-CM | POA: Diagnosis not present

## 2021-01-03 DIAGNOSIS — J9601 Acute respiratory failure with hypoxia: Secondary | ICD-10-CM | POA: Diagnosis not present

## 2021-01-03 DIAGNOSIS — E876 Hypokalemia: Secondary | ICD-10-CM | POA: Diagnosis not present

## 2021-01-03 DIAGNOSIS — M1990 Unspecified osteoarthritis, unspecified site: Secondary | ICD-10-CM | POA: Diagnosis not present

## 2021-01-03 DIAGNOSIS — T83511A Infection and inflammatory reaction due to indwelling urethral catheter, initial encounter: Secondary | ICD-10-CM | POA: Diagnosis not present

## 2021-01-03 DIAGNOSIS — I2510 Atherosclerotic heart disease of native coronary artery without angina pectoris: Secondary | ICD-10-CM | POA: Diagnosis not present

## 2021-01-03 DIAGNOSIS — I5030 Unspecified diastolic (congestive) heart failure: Secondary | ICD-10-CM | POA: Diagnosis not present

## 2021-01-03 DIAGNOSIS — K219 Gastro-esophageal reflux disease without esophagitis: Secondary | ICD-10-CM | POA: Diagnosis not present

## 2021-01-03 DIAGNOSIS — E7800 Pure hypercholesterolemia, unspecified: Secondary | ICD-10-CM | POA: Diagnosis not present

## 2021-01-04 ENCOUNTER — Telehealth: Payer: Self-pay | Admitting: Oncology

## 2021-01-04 NOTE — Telephone Encounter (Signed)
Per 12/13 Staff Msg, patient scheduled for 1/16 Lab Appt at 11:30 am

## 2021-01-06 DIAGNOSIS — F32A Depression, unspecified: Secondary | ICD-10-CM | POA: Diagnosis not present

## 2021-01-06 DIAGNOSIS — T83511A Infection and inflammatory reaction due to indwelling urethral catheter, initial encounter: Secondary | ICD-10-CM | POA: Diagnosis not present

## 2021-01-06 DIAGNOSIS — J439 Emphysema, unspecified: Secondary | ICD-10-CM | POA: Diagnosis not present

## 2021-01-06 DIAGNOSIS — I222 Subsequent non-ST elevation (NSTEMI) myocardial infarction: Secondary | ICD-10-CM | POA: Diagnosis not present

## 2021-01-06 DIAGNOSIS — I700 Atherosclerosis of aorta: Secondary | ICD-10-CM | POA: Diagnosis not present

## 2021-01-06 DIAGNOSIS — M1990 Unspecified osteoarthritis, unspecified site: Secondary | ICD-10-CM | POA: Diagnosis not present

## 2021-01-06 DIAGNOSIS — D649 Anemia, unspecified: Secondary | ICD-10-CM | POA: Diagnosis not present

## 2021-01-06 DIAGNOSIS — E7800 Pure hypercholesterolemia, unspecified: Secondary | ICD-10-CM | POA: Diagnosis not present

## 2021-01-06 DIAGNOSIS — K5790 Diverticulosis of intestine, part unspecified, without perforation or abscess without bleeding: Secondary | ICD-10-CM | POA: Diagnosis not present

## 2021-01-06 DIAGNOSIS — A419 Sepsis, unspecified organism: Secondary | ICD-10-CM | POA: Diagnosis not present

## 2021-01-06 DIAGNOSIS — I5030 Unspecified diastolic (congestive) heart failure: Secondary | ICD-10-CM | POA: Diagnosis not present

## 2021-01-06 DIAGNOSIS — L89150 Pressure ulcer of sacral region, unstageable: Secondary | ICD-10-CM | POA: Diagnosis not present

## 2021-01-06 DIAGNOSIS — I2510 Atherosclerotic heart disease of native coronary artery without angina pectoris: Secondary | ICD-10-CM | POA: Diagnosis not present

## 2021-01-06 DIAGNOSIS — I480 Paroxysmal atrial fibrillation: Secondary | ICD-10-CM | POA: Diagnosis not present

## 2021-01-06 DIAGNOSIS — J9601 Acute respiratory failure with hypoxia: Secondary | ICD-10-CM | POA: Diagnosis not present

## 2021-01-06 DIAGNOSIS — K219 Gastro-esophageal reflux disease without esophagitis: Secondary | ICD-10-CM | POA: Diagnosis not present

## 2021-01-06 DIAGNOSIS — N390 Urinary tract infection, site not specified: Secondary | ICD-10-CM | POA: Diagnosis not present

## 2021-01-06 DIAGNOSIS — Z7982 Long term (current) use of aspirin: Secondary | ICD-10-CM | POA: Diagnosis not present

## 2021-01-06 DIAGNOSIS — G9341 Metabolic encephalopathy: Secondary | ICD-10-CM | POA: Diagnosis not present

## 2021-01-06 DIAGNOSIS — R6521 Severe sepsis with septic shock: Secondary | ICD-10-CM | POA: Diagnosis not present

## 2021-01-06 DIAGNOSIS — I110 Hypertensive heart disease with heart failure: Secondary | ICD-10-CM | POA: Diagnosis not present

## 2021-01-06 DIAGNOSIS — E876 Hypokalemia: Secondary | ICD-10-CM | POA: Diagnosis not present

## 2021-01-07 DIAGNOSIS — I2510 Atherosclerotic heart disease of native coronary artery without angina pectoris: Secondary | ICD-10-CM | POA: Diagnosis not present

## 2021-01-07 DIAGNOSIS — J439 Emphysema, unspecified: Secondary | ICD-10-CM | POA: Diagnosis not present

## 2021-01-07 DIAGNOSIS — I480 Paroxysmal atrial fibrillation: Secondary | ICD-10-CM | POA: Diagnosis not present

## 2021-01-07 DIAGNOSIS — F32A Depression, unspecified: Secondary | ICD-10-CM | POA: Diagnosis not present

## 2021-01-07 DIAGNOSIS — A419 Sepsis, unspecified organism: Secondary | ICD-10-CM | POA: Diagnosis not present

## 2021-01-07 DIAGNOSIS — Z7982 Long term (current) use of aspirin: Secondary | ICD-10-CM | POA: Diagnosis not present

## 2021-01-07 DIAGNOSIS — I110 Hypertensive heart disease with heart failure: Secondary | ICD-10-CM | POA: Diagnosis not present

## 2021-01-07 DIAGNOSIS — K219 Gastro-esophageal reflux disease without esophagitis: Secondary | ICD-10-CM | POA: Diagnosis not present

## 2021-01-07 DIAGNOSIS — N390 Urinary tract infection, site not specified: Secondary | ICD-10-CM | POA: Diagnosis not present

## 2021-01-07 DIAGNOSIS — M1990 Unspecified osteoarthritis, unspecified site: Secondary | ICD-10-CM | POA: Diagnosis not present

## 2021-01-07 DIAGNOSIS — I700 Atherosclerosis of aorta: Secondary | ICD-10-CM | POA: Diagnosis not present

## 2021-01-07 DIAGNOSIS — D649 Anemia, unspecified: Secondary | ICD-10-CM | POA: Diagnosis not present

## 2021-01-07 DIAGNOSIS — I5030 Unspecified diastolic (congestive) heart failure: Secondary | ICD-10-CM | POA: Diagnosis not present

## 2021-01-07 DIAGNOSIS — N3000 Acute cystitis without hematuria: Secondary | ICD-10-CM | POA: Diagnosis not present

## 2021-01-07 DIAGNOSIS — T83511A Infection and inflammatory reaction due to indwelling urethral catheter, initial encounter: Secondary | ICD-10-CM | POA: Diagnosis not present

## 2021-01-07 DIAGNOSIS — E7800 Pure hypercholesterolemia, unspecified: Secondary | ICD-10-CM | POA: Diagnosis not present

## 2021-01-07 DIAGNOSIS — K5790 Diverticulosis of intestine, part unspecified, without perforation or abscess without bleeding: Secondary | ICD-10-CM | POA: Diagnosis not present

## 2021-01-07 DIAGNOSIS — R6521 Severe sepsis with septic shock: Secondary | ICD-10-CM | POA: Diagnosis not present

## 2021-01-07 DIAGNOSIS — G9341 Metabolic encephalopathy: Secondary | ICD-10-CM | POA: Diagnosis not present

## 2021-01-07 DIAGNOSIS — E876 Hypokalemia: Secondary | ICD-10-CM | POA: Diagnosis not present

## 2021-01-07 DIAGNOSIS — I222 Subsequent non-ST elevation (NSTEMI) myocardial infarction: Secondary | ICD-10-CM | POA: Diagnosis not present

## 2021-01-07 DIAGNOSIS — J9601 Acute respiratory failure with hypoxia: Secondary | ICD-10-CM | POA: Diagnosis not present

## 2021-01-07 DIAGNOSIS — L89150 Pressure ulcer of sacral region, unstageable: Secondary | ICD-10-CM | POA: Diagnosis not present

## 2021-01-08 DIAGNOSIS — I2510 Atherosclerotic heart disease of native coronary artery without angina pectoris: Secondary | ICD-10-CM | POA: Diagnosis not present

## 2021-01-08 DIAGNOSIS — L89150 Pressure ulcer of sacral region, unstageable: Secondary | ICD-10-CM | POA: Diagnosis not present

## 2021-01-08 DIAGNOSIS — J9601 Acute respiratory failure with hypoxia: Secondary | ICD-10-CM | POA: Diagnosis not present

## 2021-01-08 DIAGNOSIS — F32A Depression, unspecified: Secondary | ICD-10-CM | POA: Diagnosis not present

## 2021-01-08 DIAGNOSIS — K5790 Diverticulosis of intestine, part unspecified, without perforation or abscess without bleeding: Secondary | ICD-10-CM | POA: Diagnosis not present

## 2021-01-08 DIAGNOSIS — D649 Anemia, unspecified: Secondary | ICD-10-CM | POA: Diagnosis not present

## 2021-01-08 DIAGNOSIS — Z7982 Long term (current) use of aspirin: Secondary | ICD-10-CM | POA: Diagnosis not present

## 2021-01-08 DIAGNOSIS — E876 Hypokalemia: Secondary | ICD-10-CM | POA: Diagnosis not present

## 2021-01-08 DIAGNOSIS — I110 Hypertensive heart disease with heart failure: Secondary | ICD-10-CM | POA: Diagnosis not present

## 2021-01-08 DIAGNOSIS — I480 Paroxysmal atrial fibrillation: Secondary | ICD-10-CM | POA: Diagnosis not present

## 2021-01-08 DIAGNOSIS — I700 Atherosclerosis of aorta: Secondary | ICD-10-CM | POA: Diagnosis not present

## 2021-01-08 DIAGNOSIS — T83511A Infection and inflammatory reaction due to indwelling urethral catheter, initial encounter: Secondary | ICD-10-CM | POA: Diagnosis not present

## 2021-01-08 DIAGNOSIS — I222 Subsequent non-ST elevation (NSTEMI) myocardial infarction: Secondary | ICD-10-CM | POA: Diagnosis not present

## 2021-01-08 DIAGNOSIS — R6521 Severe sepsis with septic shock: Secondary | ICD-10-CM | POA: Diagnosis not present

## 2021-01-08 DIAGNOSIS — E7800 Pure hypercholesterolemia, unspecified: Secondary | ICD-10-CM | POA: Diagnosis not present

## 2021-01-08 DIAGNOSIS — N390 Urinary tract infection, site not specified: Secondary | ICD-10-CM | POA: Diagnosis not present

## 2021-01-08 DIAGNOSIS — A419 Sepsis, unspecified organism: Secondary | ICD-10-CM | POA: Diagnosis not present

## 2021-01-08 DIAGNOSIS — I5030 Unspecified diastolic (congestive) heart failure: Secondary | ICD-10-CM | POA: Diagnosis not present

## 2021-01-08 DIAGNOSIS — J439 Emphysema, unspecified: Secondary | ICD-10-CM | POA: Diagnosis not present

## 2021-01-08 DIAGNOSIS — K219 Gastro-esophageal reflux disease without esophagitis: Secondary | ICD-10-CM | POA: Diagnosis not present

## 2021-01-08 DIAGNOSIS — M1990 Unspecified osteoarthritis, unspecified site: Secondary | ICD-10-CM | POA: Diagnosis not present

## 2021-01-08 DIAGNOSIS — G9341 Metabolic encephalopathy: Secondary | ICD-10-CM | POA: Diagnosis not present

## 2021-01-10 DIAGNOSIS — I110 Hypertensive heart disease with heart failure: Secondary | ICD-10-CM | POA: Diagnosis not present

## 2021-01-10 DIAGNOSIS — I2510 Atherosclerotic heart disease of native coronary artery without angina pectoris: Secondary | ICD-10-CM | POA: Diagnosis not present

## 2021-01-10 DIAGNOSIS — I222 Subsequent non-ST elevation (NSTEMI) myocardial infarction: Secondary | ICD-10-CM | POA: Diagnosis not present

## 2021-01-10 DIAGNOSIS — E7800 Pure hypercholesterolemia, unspecified: Secondary | ICD-10-CM | POA: Diagnosis not present

## 2021-01-10 DIAGNOSIS — R6521 Severe sepsis with septic shock: Secondary | ICD-10-CM | POA: Diagnosis not present

## 2021-01-10 DIAGNOSIS — J9601 Acute respiratory failure with hypoxia: Secondary | ICD-10-CM | POA: Diagnosis not present

## 2021-01-10 DIAGNOSIS — M1990 Unspecified osteoarthritis, unspecified site: Secondary | ICD-10-CM | POA: Diagnosis not present

## 2021-01-10 DIAGNOSIS — K219 Gastro-esophageal reflux disease without esophagitis: Secondary | ICD-10-CM | POA: Diagnosis not present

## 2021-01-10 DIAGNOSIS — J439 Emphysema, unspecified: Secondary | ICD-10-CM | POA: Diagnosis not present

## 2021-01-10 DIAGNOSIS — Z7982 Long term (current) use of aspirin: Secondary | ICD-10-CM | POA: Diagnosis not present

## 2021-01-10 DIAGNOSIS — E876 Hypokalemia: Secondary | ICD-10-CM | POA: Diagnosis not present

## 2021-01-10 DIAGNOSIS — T83511A Infection and inflammatory reaction due to indwelling urethral catheter, initial encounter: Secondary | ICD-10-CM | POA: Diagnosis not present

## 2021-01-10 DIAGNOSIS — G9341 Metabolic encephalopathy: Secondary | ICD-10-CM | POA: Diagnosis not present

## 2021-01-10 DIAGNOSIS — D649 Anemia, unspecified: Secondary | ICD-10-CM | POA: Diagnosis not present

## 2021-01-10 DIAGNOSIS — I480 Paroxysmal atrial fibrillation: Secondary | ICD-10-CM | POA: Diagnosis not present

## 2021-01-10 DIAGNOSIS — L89150 Pressure ulcer of sacral region, unstageable: Secondary | ICD-10-CM | POA: Diagnosis not present

## 2021-01-10 DIAGNOSIS — I700 Atherosclerosis of aorta: Secondary | ICD-10-CM | POA: Diagnosis not present

## 2021-01-10 DIAGNOSIS — I5030 Unspecified diastolic (congestive) heart failure: Secondary | ICD-10-CM | POA: Diagnosis not present

## 2021-01-10 DIAGNOSIS — K5790 Diverticulosis of intestine, part unspecified, without perforation or abscess without bleeding: Secondary | ICD-10-CM | POA: Diagnosis not present

## 2021-01-10 DIAGNOSIS — N390 Urinary tract infection, site not specified: Secondary | ICD-10-CM | POA: Diagnosis not present

## 2021-01-10 DIAGNOSIS — F32A Depression, unspecified: Secondary | ICD-10-CM | POA: Diagnosis not present

## 2021-01-10 DIAGNOSIS — A419 Sepsis, unspecified organism: Secondary | ICD-10-CM | POA: Diagnosis not present

## 2021-01-11 DIAGNOSIS — E7800 Pure hypercholesterolemia, unspecified: Secondary | ICD-10-CM | POA: Diagnosis not present

## 2021-01-11 DIAGNOSIS — K5790 Diverticulosis of intestine, part unspecified, without perforation or abscess without bleeding: Secondary | ICD-10-CM | POA: Diagnosis not present

## 2021-01-11 DIAGNOSIS — J439 Emphysema, unspecified: Secondary | ICD-10-CM | POA: Diagnosis not present

## 2021-01-11 DIAGNOSIS — L89150 Pressure ulcer of sacral region, unstageable: Secondary | ICD-10-CM | POA: Diagnosis not present

## 2021-01-11 DIAGNOSIS — J9601 Acute respiratory failure with hypoxia: Secondary | ICD-10-CM | POA: Diagnosis not present

## 2021-01-11 DIAGNOSIS — I2510 Atherosclerotic heart disease of native coronary artery without angina pectoris: Secondary | ICD-10-CM | POA: Diagnosis not present

## 2021-01-11 DIAGNOSIS — A419 Sepsis, unspecified organism: Secondary | ICD-10-CM | POA: Diagnosis not present

## 2021-01-11 DIAGNOSIS — I110 Hypertensive heart disease with heart failure: Secondary | ICD-10-CM | POA: Diagnosis not present

## 2021-01-11 DIAGNOSIS — M1990 Unspecified osteoarthritis, unspecified site: Secondary | ICD-10-CM | POA: Diagnosis not present

## 2021-01-11 DIAGNOSIS — I480 Paroxysmal atrial fibrillation: Secondary | ICD-10-CM | POA: Diagnosis not present

## 2021-01-11 DIAGNOSIS — E876 Hypokalemia: Secondary | ICD-10-CM | POA: Diagnosis not present

## 2021-01-11 DIAGNOSIS — I222 Subsequent non-ST elevation (NSTEMI) myocardial infarction: Secondary | ICD-10-CM | POA: Diagnosis not present

## 2021-01-11 DIAGNOSIS — R6521 Severe sepsis with septic shock: Secondary | ICD-10-CM | POA: Diagnosis not present

## 2021-01-11 DIAGNOSIS — Z7982 Long term (current) use of aspirin: Secondary | ICD-10-CM | POA: Diagnosis not present

## 2021-01-11 DIAGNOSIS — D649 Anemia, unspecified: Secondary | ICD-10-CM | POA: Diagnosis not present

## 2021-01-11 DIAGNOSIS — K219 Gastro-esophageal reflux disease without esophagitis: Secondary | ICD-10-CM | POA: Diagnosis not present

## 2021-01-11 DIAGNOSIS — I700 Atherosclerosis of aorta: Secondary | ICD-10-CM | POA: Diagnosis not present

## 2021-01-11 DIAGNOSIS — G9341 Metabolic encephalopathy: Secondary | ICD-10-CM | POA: Diagnosis not present

## 2021-01-11 DIAGNOSIS — I5030 Unspecified diastolic (congestive) heart failure: Secondary | ICD-10-CM | POA: Diagnosis not present

## 2021-01-11 DIAGNOSIS — T83511A Infection and inflammatory reaction due to indwelling urethral catheter, initial encounter: Secondary | ICD-10-CM | POA: Diagnosis not present

## 2021-01-11 DIAGNOSIS — F32A Depression, unspecified: Secondary | ICD-10-CM | POA: Diagnosis not present

## 2021-01-11 DIAGNOSIS — N390 Urinary tract infection, site not specified: Secondary | ICD-10-CM | POA: Diagnosis not present

## 2021-01-14 DIAGNOSIS — E7800 Pure hypercholesterolemia, unspecified: Secondary | ICD-10-CM | POA: Diagnosis not present

## 2021-01-14 DIAGNOSIS — K5790 Diverticulosis of intestine, part unspecified, without perforation or abscess without bleeding: Secondary | ICD-10-CM | POA: Diagnosis not present

## 2021-01-14 DIAGNOSIS — L89150 Pressure ulcer of sacral region, unstageable: Secondary | ICD-10-CM | POA: Diagnosis not present

## 2021-01-14 DIAGNOSIS — G9341 Metabolic encephalopathy: Secondary | ICD-10-CM | POA: Diagnosis not present

## 2021-01-14 DIAGNOSIS — I110 Hypertensive heart disease with heart failure: Secondary | ICD-10-CM | POA: Diagnosis not present

## 2021-01-14 DIAGNOSIS — A419 Sepsis, unspecified organism: Secondary | ICD-10-CM | POA: Diagnosis not present

## 2021-01-14 DIAGNOSIS — J9601 Acute respiratory failure with hypoxia: Secondary | ICD-10-CM | POA: Diagnosis not present

## 2021-01-14 DIAGNOSIS — J439 Emphysema, unspecified: Secondary | ICD-10-CM | POA: Diagnosis not present

## 2021-01-14 DIAGNOSIS — I2510 Atherosclerotic heart disease of native coronary artery without angina pectoris: Secondary | ICD-10-CM | POA: Diagnosis not present

## 2021-01-14 DIAGNOSIS — E876 Hypokalemia: Secondary | ICD-10-CM | POA: Diagnosis not present

## 2021-01-14 DIAGNOSIS — F32A Depression, unspecified: Secondary | ICD-10-CM | POA: Diagnosis not present

## 2021-01-14 DIAGNOSIS — I700 Atherosclerosis of aorta: Secondary | ICD-10-CM | POA: Diagnosis not present

## 2021-01-14 DIAGNOSIS — R6521 Severe sepsis with septic shock: Secondary | ICD-10-CM | POA: Diagnosis not present

## 2021-01-14 DIAGNOSIS — T83511A Infection and inflammatory reaction due to indwelling urethral catheter, initial encounter: Secondary | ICD-10-CM | POA: Diagnosis not present

## 2021-01-14 DIAGNOSIS — I5030 Unspecified diastolic (congestive) heart failure: Secondary | ICD-10-CM | POA: Diagnosis not present

## 2021-01-14 DIAGNOSIS — I222 Subsequent non-ST elevation (NSTEMI) myocardial infarction: Secondary | ICD-10-CM | POA: Diagnosis not present

## 2021-01-14 DIAGNOSIS — D649 Anemia, unspecified: Secondary | ICD-10-CM | POA: Diagnosis not present

## 2021-01-14 DIAGNOSIS — M1990 Unspecified osteoarthritis, unspecified site: Secondary | ICD-10-CM | POA: Diagnosis not present

## 2021-01-14 DIAGNOSIS — I480 Paroxysmal atrial fibrillation: Secondary | ICD-10-CM | POA: Diagnosis not present

## 2021-01-14 DIAGNOSIS — N390 Urinary tract infection, site not specified: Secondary | ICD-10-CM | POA: Diagnosis not present

## 2021-01-14 DIAGNOSIS — K219 Gastro-esophageal reflux disease without esophagitis: Secondary | ICD-10-CM | POA: Diagnosis not present

## 2021-01-14 DIAGNOSIS — Z7982 Long term (current) use of aspirin: Secondary | ICD-10-CM | POA: Diagnosis not present

## 2021-01-15 DIAGNOSIS — I222 Subsequent non-ST elevation (NSTEMI) myocardial infarction: Secondary | ICD-10-CM | POA: Diagnosis not present

## 2021-01-15 DIAGNOSIS — Z7982 Long term (current) use of aspirin: Secondary | ICD-10-CM | POA: Diagnosis not present

## 2021-01-15 DIAGNOSIS — G9341 Metabolic encephalopathy: Secondary | ICD-10-CM | POA: Diagnosis not present

## 2021-01-15 DIAGNOSIS — K5790 Diverticulosis of intestine, part unspecified, without perforation or abscess without bleeding: Secondary | ICD-10-CM | POA: Diagnosis not present

## 2021-01-15 DIAGNOSIS — J439 Emphysema, unspecified: Secondary | ICD-10-CM | POA: Diagnosis not present

## 2021-01-15 DIAGNOSIS — J9601 Acute respiratory failure with hypoxia: Secondary | ICD-10-CM | POA: Diagnosis not present

## 2021-01-15 DIAGNOSIS — F32A Depression, unspecified: Secondary | ICD-10-CM | POA: Diagnosis not present

## 2021-01-15 DIAGNOSIS — A419 Sepsis, unspecified organism: Secondary | ICD-10-CM | POA: Diagnosis not present

## 2021-01-15 DIAGNOSIS — I2510 Atherosclerotic heart disease of native coronary artery without angina pectoris: Secondary | ICD-10-CM | POA: Diagnosis not present

## 2021-01-15 DIAGNOSIS — I110 Hypertensive heart disease with heart failure: Secondary | ICD-10-CM | POA: Diagnosis not present

## 2021-01-15 DIAGNOSIS — I700 Atherosclerosis of aorta: Secondary | ICD-10-CM | POA: Diagnosis not present

## 2021-01-15 DIAGNOSIS — D649 Anemia, unspecified: Secondary | ICD-10-CM | POA: Diagnosis not present

## 2021-01-15 DIAGNOSIS — E7800 Pure hypercholesterolemia, unspecified: Secondary | ICD-10-CM | POA: Diagnosis not present

## 2021-01-15 DIAGNOSIS — T83511A Infection and inflammatory reaction due to indwelling urethral catheter, initial encounter: Secondary | ICD-10-CM | POA: Diagnosis not present

## 2021-01-15 DIAGNOSIS — L89150 Pressure ulcer of sacral region, unstageable: Secondary | ICD-10-CM | POA: Diagnosis not present

## 2021-01-15 DIAGNOSIS — R6521 Severe sepsis with septic shock: Secondary | ICD-10-CM | POA: Diagnosis not present

## 2021-01-15 DIAGNOSIS — N390 Urinary tract infection, site not specified: Secondary | ICD-10-CM | POA: Diagnosis not present

## 2021-01-15 DIAGNOSIS — M1990 Unspecified osteoarthritis, unspecified site: Secondary | ICD-10-CM | POA: Diagnosis not present

## 2021-01-15 DIAGNOSIS — I480 Paroxysmal atrial fibrillation: Secondary | ICD-10-CM | POA: Diagnosis not present

## 2021-01-15 DIAGNOSIS — K219 Gastro-esophageal reflux disease without esophagitis: Secondary | ICD-10-CM | POA: Diagnosis not present

## 2021-01-15 DIAGNOSIS — E876 Hypokalemia: Secondary | ICD-10-CM | POA: Diagnosis not present

## 2021-01-15 DIAGNOSIS — I5030 Unspecified diastolic (congestive) heart failure: Secondary | ICD-10-CM | POA: Diagnosis not present

## 2021-01-16 DIAGNOSIS — J9601 Acute respiratory failure with hypoxia: Secondary | ICD-10-CM | POA: Diagnosis not present

## 2021-01-17 DIAGNOSIS — Z7982 Long term (current) use of aspirin: Secondary | ICD-10-CM | POA: Diagnosis not present

## 2021-01-17 DIAGNOSIS — I110 Hypertensive heart disease with heart failure: Secondary | ICD-10-CM | POA: Diagnosis not present

## 2021-01-17 DIAGNOSIS — G9341 Metabolic encephalopathy: Secondary | ICD-10-CM | POA: Diagnosis not present

## 2021-01-17 DIAGNOSIS — L89150 Pressure ulcer of sacral region, unstageable: Secondary | ICD-10-CM | POA: Diagnosis not present

## 2021-01-17 DIAGNOSIS — E7800 Pure hypercholesterolemia, unspecified: Secondary | ICD-10-CM | POA: Diagnosis not present

## 2021-01-17 DIAGNOSIS — D649 Anemia, unspecified: Secondary | ICD-10-CM | POA: Diagnosis not present

## 2021-01-17 DIAGNOSIS — J9601 Acute respiratory failure with hypoxia: Secondary | ICD-10-CM | POA: Diagnosis not present

## 2021-01-17 DIAGNOSIS — J439 Emphysema, unspecified: Secondary | ICD-10-CM | POA: Diagnosis not present

## 2021-01-17 DIAGNOSIS — Z5189 Encounter for other specified aftercare: Secondary | ICD-10-CM | POA: Diagnosis not present

## 2021-01-17 DIAGNOSIS — I222 Subsequent non-ST elevation (NSTEMI) myocardial infarction: Secondary | ICD-10-CM | POA: Diagnosis not present

## 2021-01-17 DIAGNOSIS — A419 Sepsis, unspecified organism: Secondary | ICD-10-CM | POA: Diagnosis not present

## 2021-01-17 DIAGNOSIS — I700 Atherosclerosis of aorta: Secondary | ICD-10-CM | POA: Diagnosis not present

## 2021-01-17 DIAGNOSIS — M1990 Unspecified osteoarthritis, unspecified site: Secondary | ICD-10-CM | POA: Diagnosis not present

## 2021-01-17 DIAGNOSIS — K219 Gastro-esophageal reflux disease without esophagitis: Secondary | ICD-10-CM | POA: Diagnosis not present

## 2021-01-17 DIAGNOSIS — I2510 Atherosclerotic heart disease of native coronary artery without angina pectoris: Secondary | ICD-10-CM | POA: Diagnosis not present

## 2021-01-17 DIAGNOSIS — K5790 Diverticulosis of intestine, part unspecified, without perforation or abscess without bleeding: Secondary | ICD-10-CM | POA: Diagnosis not present

## 2021-01-17 DIAGNOSIS — R6521 Severe sepsis with septic shock: Secondary | ICD-10-CM | POA: Diagnosis not present

## 2021-01-17 DIAGNOSIS — I480 Paroxysmal atrial fibrillation: Secondary | ICD-10-CM | POA: Diagnosis not present

## 2021-01-17 DIAGNOSIS — I5030 Unspecified diastolic (congestive) heart failure: Secondary | ICD-10-CM | POA: Diagnosis not present

## 2021-01-17 DIAGNOSIS — E876 Hypokalemia: Secondary | ICD-10-CM | POA: Diagnosis not present

## 2021-01-17 DIAGNOSIS — N390 Urinary tract infection, site not specified: Secondary | ICD-10-CM | POA: Diagnosis not present

## 2021-01-17 DIAGNOSIS — F32A Depression, unspecified: Secondary | ICD-10-CM | POA: Diagnosis not present

## 2021-01-17 DIAGNOSIS — T83511A Infection and inflammatory reaction due to indwelling urethral catheter, initial encounter: Secondary | ICD-10-CM | POA: Diagnosis not present

## 2021-01-18 DIAGNOSIS — I480 Paroxysmal atrial fibrillation: Secondary | ICD-10-CM | POA: Diagnosis not present

## 2021-01-18 DIAGNOSIS — K5790 Diverticulosis of intestine, part unspecified, without perforation or abscess without bleeding: Secondary | ICD-10-CM | POA: Diagnosis not present

## 2021-01-18 DIAGNOSIS — J9601 Acute respiratory failure with hypoxia: Secondary | ICD-10-CM | POA: Diagnosis not present

## 2021-01-18 DIAGNOSIS — G9341 Metabolic encephalopathy: Secondary | ICD-10-CM | POA: Diagnosis not present

## 2021-01-18 DIAGNOSIS — I110 Hypertensive heart disease with heart failure: Secondary | ICD-10-CM | POA: Diagnosis not present

## 2021-01-18 DIAGNOSIS — K219 Gastro-esophageal reflux disease without esophagitis: Secondary | ICD-10-CM | POA: Diagnosis not present

## 2021-01-18 DIAGNOSIS — A419 Sepsis, unspecified organism: Secondary | ICD-10-CM | POA: Diagnosis not present

## 2021-01-18 DIAGNOSIS — R6521 Severe sepsis with septic shock: Secondary | ICD-10-CM | POA: Diagnosis not present

## 2021-01-18 DIAGNOSIS — L89150 Pressure ulcer of sacral region, unstageable: Secondary | ICD-10-CM | POA: Diagnosis not present

## 2021-01-18 DIAGNOSIS — N390 Urinary tract infection, site not specified: Secondary | ICD-10-CM | POA: Diagnosis not present

## 2021-01-18 DIAGNOSIS — E7800 Pure hypercholesterolemia, unspecified: Secondary | ICD-10-CM | POA: Diagnosis not present

## 2021-01-18 DIAGNOSIS — F32A Depression, unspecified: Secondary | ICD-10-CM | POA: Diagnosis not present

## 2021-01-18 DIAGNOSIS — E876 Hypokalemia: Secondary | ICD-10-CM | POA: Diagnosis not present

## 2021-01-18 DIAGNOSIS — M1990 Unspecified osteoarthritis, unspecified site: Secondary | ICD-10-CM | POA: Diagnosis not present

## 2021-01-18 DIAGNOSIS — I2510 Atherosclerotic heart disease of native coronary artery without angina pectoris: Secondary | ICD-10-CM | POA: Diagnosis not present

## 2021-01-18 DIAGNOSIS — T83511A Infection and inflammatory reaction due to indwelling urethral catheter, initial encounter: Secondary | ICD-10-CM | POA: Diagnosis not present

## 2021-01-18 DIAGNOSIS — I222 Subsequent non-ST elevation (NSTEMI) myocardial infarction: Secondary | ICD-10-CM | POA: Diagnosis not present

## 2021-01-18 DIAGNOSIS — Z7982 Long term (current) use of aspirin: Secondary | ICD-10-CM | POA: Diagnosis not present

## 2021-01-18 DIAGNOSIS — D649 Anemia, unspecified: Secondary | ICD-10-CM | POA: Diagnosis not present

## 2021-01-18 DIAGNOSIS — I700 Atherosclerosis of aorta: Secondary | ICD-10-CM | POA: Diagnosis not present

## 2021-01-18 DIAGNOSIS — J439 Emphysema, unspecified: Secondary | ICD-10-CM | POA: Diagnosis not present

## 2021-01-18 DIAGNOSIS — I5030 Unspecified diastolic (congestive) heart failure: Secondary | ICD-10-CM | POA: Diagnosis not present

## 2021-01-20 DIAGNOSIS — J9601 Acute respiratory failure with hypoxia: Secondary | ICD-10-CM | POA: Diagnosis not present

## 2021-01-20 DIAGNOSIS — I480 Paroxysmal atrial fibrillation: Secondary | ICD-10-CM | POA: Diagnosis not present

## 2021-01-21 DIAGNOSIS — Z85118 Personal history of other malignant neoplasm of bronchus and lung: Secondary | ICD-10-CM | POA: Diagnosis not present

## 2021-01-21 DIAGNOSIS — E7800 Pure hypercholesterolemia, unspecified: Secondary | ICD-10-CM | POA: Diagnosis not present

## 2021-01-21 DIAGNOSIS — I5030 Unspecified diastolic (congestive) heart failure: Secondary | ICD-10-CM | POA: Diagnosis not present

## 2021-01-21 DIAGNOSIS — M1990 Unspecified osteoarthritis, unspecified site: Secondary | ICD-10-CM | POA: Diagnosis not present

## 2021-01-21 DIAGNOSIS — K219 Gastro-esophageal reflux disease without esophagitis: Secondary | ICD-10-CM | POA: Diagnosis not present

## 2021-01-21 DIAGNOSIS — D649 Anemia, unspecified: Secondary | ICD-10-CM | POA: Diagnosis not present

## 2021-01-21 DIAGNOSIS — F32A Depression, unspecified: Secondary | ICD-10-CM | POA: Diagnosis not present

## 2021-01-21 DIAGNOSIS — I222 Subsequent non-ST elevation (NSTEMI) myocardial infarction: Secondary | ICD-10-CM | POA: Diagnosis not present

## 2021-01-21 DIAGNOSIS — G9341 Metabolic encephalopathy: Secondary | ICD-10-CM | POA: Diagnosis not present

## 2021-01-21 DIAGNOSIS — Z87891 Personal history of nicotine dependence: Secondary | ICD-10-CM | POA: Diagnosis not present

## 2021-01-21 DIAGNOSIS — I480 Paroxysmal atrial fibrillation: Secondary | ICD-10-CM | POA: Diagnosis not present

## 2021-01-21 DIAGNOSIS — I2510 Atherosclerotic heart disease of native coronary artery without angina pectoris: Secondary | ICD-10-CM | POA: Diagnosis not present

## 2021-01-21 DIAGNOSIS — Z8673 Personal history of transient ischemic attack (TIA), and cerebral infarction without residual deficits: Secondary | ICD-10-CM | POA: Diagnosis not present

## 2021-01-21 DIAGNOSIS — Z7982 Long term (current) use of aspirin: Secondary | ICD-10-CM | POA: Diagnosis not present

## 2021-01-21 DIAGNOSIS — E876 Hypokalemia: Secondary | ICD-10-CM | POA: Diagnosis not present

## 2021-01-21 DIAGNOSIS — I700 Atherosclerosis of aorta: Secondary | ICD-10-CM | POA: Diagnosis not present

## 2021-01-21 DIAGNOSIS — I110 Hypertensive heart disease with heart failure: Secondary | ICD-10-CM | POA: Diagnosis not present

## 2021-01-21 DIAGNOSIS — L89150 Pressure ulcer of sacral region, unstageable: Secondary | ICD-10-CM | POA: Diagnosis not present

## 2021-01-21 DIAGNOSIS — Z9181 History of falling: Secondary | ICD-10-CM | POA: Diagnosis not present

## 2021-01-21 DIAGNOSIS — J439 Emphysema, unspecified: Secondary | ICD-10-CM | POA: Diagnosis not present

## 2021-01-21 DIAGNOSIS — Z452 Encounter for adjustment and management of vascular access device: Secondary | ICD-10-CM | POA: Diagnosis not present

## 2021-01-21 DIAGNOSIS — K5790 Diverticulosis of intestine, part unspecified, without perforation or abscess without bleeding: Secondary | ICD-10-CM | POA: Diagnosis not present

## 2021-01-24 DIAGNOSIS — G9341 Metabolic encephalopathy: Secondary | ICD-10-CM | POA: Diagnosis not present

## 2021-01-24 DIAGNOSIS — J439 Emphysema, unspecified: Secondary | ICD-10-CM | POA: Diagnosis not present

## 2021-01-24 DIAGNOSIS — M1990 Unspecified osteoarthritis, unspecified site: Secondary | ICD-10-CM | POA: Diagnosis not present

## 2021-01-24 DIAGNOSIS — L89150 Pressure ulcer of sacral region, unstageable: Secondary | ICD-10-CM | POA: Diagnosis not present

## 2021-01-24 DIAGNOSIS — F32A Depression, unspecified: Secondary | ICD-10-CM | POA: Diagnosis not present

## 2021-01-24 DIAGNOSIS — D649 Anemia, unspecified: Secondary | ICD-10-CM | POA: Diagnosis not present

## 2021-01-24 DIAGNOSIS — I700 Atherosclerosis of aorta: Secondary | ICD-10-CM | POA: Diagnosis not present

## 2021-01-24 DIAGNOSIS — I110 Hypertensive heart disease with heart failure: Secondary | ICD-10-CM | POA: Diagnosis not present

## 2021-01-24 DIAGNOSIS — E7800 Pure hypercholesterolemia, unspecified: Secondary | ICD-10-CM | POA: Diagnosis not present

## 2021-01-24 DIAGNOSIS — Z8673 Personal history of transient ischemic attack (TIA), and cerebral infarction without residual deficits: Secondary | ICD-10-CM | POA: Diagnosis not present

## 2021-01-24 DIAGNOSIS — I222 Subsequent non-ST elevation (NSTEMI) myocardial infarction: Secondary | ICD-10-CM | POA: Diagnosis not present

## 2021-01-24 DIAGNOSIS — I5030 Unspecified diastolic (congestive) heart failure: Secondary | ICD-10-CM | POA: Diagnosis not present

## 2021-01-24 DIAGNOSIS — I2510 Atherosclerotic heart disease of native coronary artery without angina pectoris: Secondary | ICD-10-CM | POA: Diagnosis not present

## 2021-01-24 DIAGNOSIS — I480 Paroxysmal atrial fibrillation: Secondary | ICD-10-CM | POA: Diagnosis not present

## 2021-01-24 DIAGNOSIS — Z9181 History of falling: Secondary | ICD-10-CM | POA: Diagnosis not present

## 2021-01-24 DIAGNOSIS — E876 Hypokalemia: Secondary | ICD-10-CM | POA: Diagnosis not present

## 2021-01-24 DIAGNOSIS — Z452 Encounter for adjustment and management of vascular access device: Secondary | ICD-10-CM | POA: Diagnosis not present

## 2021-01-24 DIAGNOSIS — Z85118 Personal history of other malignant neoplasm of bronchus and lung: Secondary | ICD-10-CM | POA: Diagnosis not present

## 2021-01-24 DIAGNOSIS — Z87891 Personal history of nicotine dependence: Secondary | ICD-10-CM | POA: Diagnosis not present

## 2021-01-24 DIAGNOSIS — K219 Gastro-esophageal reflux disease without esophagitis: Secondary | ICD-10-CM | POA: Diagnosis not present

## 2021-01-24 DIAGNOSIS — Z7982 Long term (current) use of aspirin: Secondary | ICD-10-CM | POA: Diagnosis not present

## 2021-01-24 DIAGNOSIS — K5790 Diverticulosis of intestine, part unspecified, without perforation or abscess without bleeding: Secondary | ICD-10-CM | POA: Diagnosis not present

## 2021-01-25 DIAGNOSIS — J439 Emphysema, unspecified: Secondary | ICD-10-CM | POA: Diagnosis not present

## 2021-01-25 DIAGNOSIS — Z452 Encounter for adjustment and management of vascular access device: Secondary | ICD-10-CM | POA: Diagnosis not present

## 2021-01-25 DIAGNOSIS — E876 Hypokalemia: Secondary | ICD-10-CM | POA: Diagnosis not present

## 2021-01-25 DIAGNOSIS — Z9181 History of falling: Secondary | ICD-10-CM | POA: Diagnosis not present

## 2021-01-25 DIAGNOSIS — K5790 Diverticulosis of intestine, part unspecified, without perforation or abscess without bleeding: Secondary | ICD-10-CM | POA: Diagnosis not present

## 2021-01-25 DIAGNOSIS — I222 Subsequent non-ST elevation (NSTEMI) myocardial infarction: Secondary | ICD-10-CM | POA: Diagnosis not present

## 2021-01-25 DIAGNOSIS — Z87891 Personal history of nicotine dependence: Secondary | ICD-10-CM | POA: Diagnosis not present

## 2021-01-25 DIAGNOSIS — L89150 Pressure ulcer of sacral region, unstageable: Secondary | ICD-10-CM | POA: Diagnosis not present

## 2021-01-25 DIAGNOSIS — F32A Depression, unspecified: Secondary | ICD-10-CM | POA: Diagnosis not present

## 2021-01-25 DIAGNOSIS — Z85118 Personal history of other malignant neoplasm of bronchus and lung: Secondary | ICD-10-CM | POA: Diagnosis not present

## 2021-01-25 DIAGNOSIS — D649 Anemia, unspecified: Secondary | ICD-10-CM | POA: Diagnosis not present

## 2021-01-25 DIAGNOSIS — I2510 Atherosclerotic heart disease of native coronary artery without angina pectoris: Secondary | ICD-10-CM | POA: Diagnosis not present

## 2021-01-25 DIAGNOSIS — Z8673 Personal history of transient ischemic attack (TIA), and cerebral infarction without residual deficits: Secondary | ICD-10-CM | POA: Diagnosis not present

## 2021-01-25 DIAGNOSIS — K219 Gastro-esophageal reflux disease without esophagitis: Secondary | ICD-10-CM | POA: Diagnosis not present

## 2021-01-25 DIAGNOSIS — I700 Atherosclerosis of aorta: Secondary | ICD-10-CM | POA: Diagnosis not present

## 2021-01-25 DIAGNOSIS — I480 Paroxysmal atrial fibrillation: Secondary | ICD-10-CM | POA: Diagnosis not present

## 2021-01-25 DIAGNOSIS — I5030 Unspecified diastolic (congestive) heart failure: Secondary | ICD-10-CM | POA: Diagnosis not present

## 2021-01-25 DIAGNOSIS — Z7982 Long term (current) use of aspirin: Secondary | ICD-10-CM | POA: Diagnosis not present

## 2021-01-25 DIAGNOSIS — G9341 Metabolic encephalopathy: Secondary | ICD-10-CM | POA: Diagnosis not present

## 2021-01-25 DIAGNOSIS — E7800 Pure hypercholesterolemia, unspecified: Secondary | ICD-10-CM | POA: Diagnosis not present

## 2021-01-25 DIAGNOSIS — I110 Hypertensive heart disease with heart failure: Secondary | ICD-10-CM | POA: Diagnosis not present

## 2021-01-25 DIAGNOSIS — M1990 Unspecified osteoarthritis, unspecified site: Secondary | ICD-10-CM | POA: Diagnosis not present

## 2021-01-26 DIAGNOSIS — R0789 Other chest pain: Secondary | ICD-10-CM | POA: Diagnosis not present

## 2021-01-29 DIAGNOSIS — I2510 Atherosclerotic heart disease of native coronary artery without angina pectoris: Secondary | ICD-10-CM | POA: Diagnosis not present

## 2021-01-29 DIAGNOSIS — Z9181 History of falling: Secondary | ICD-10-CM | POA: Diagnosis not present

## 2021-01-29 DIAGNOSIS — E876 Hypokalemia: Secondary | ICD-10-CM | POA: Diagnosis not present

## 2021-01-29 DIAGNOSIS — D649 Anemia, unspecified: Secondary | ICD-10-CM | POA: Diagnosis not present

## 2021-01-29 DIAGNOSIS — J439 Emphysema, unspecified: Secondary | ICD-10-CM | POA: Diagnosis not present

## 2021-01-29 DIAGNOSIS — I110 Hypertensive heart disease with heart failure: Secondary | ICD-10-CM | POA: Diagnosis not present

## 2021-01-29 DIAGNOSIS — E7800 Pure hypercholesterolemia, unspecified: Secondary | ICD-10-CM | POA: Diagnosis not present

## 2021-01-29 DIAGNOSIS — M1990 Unspecified osteoarthritis, unspecified site: Secondary | ICD-10-CM | POA: Diagnosis not present

## 2021-01-29 DIAGNOSIS — Z452 Encounter for adjustment and management of vascular access device: Secondary | ICD-10-CM | POA: Diagnosis not present

## 2021-01-29 DIAGNOSIS — K5790 Diverticulosis of intestine, part unspecified, without perforation or abscess without bleeding: Secondary | ICD-10-CM | POA: Diagnosis not present

## 2021-01-29 DIAGNOSIS — G9341 Metabolic encephalopathy: Secondary | ICD-10-CM | POA: Diagnosis not present

## 2021-01-29 DIAGNOSIS — K219 Gastro-esophageal reflux disease without esophagitis: Secondary | ICD-10-CM | POA: Diagnosis not present

## 2021-01-29 DIAGNOSIS — L89150 Pressure ulcer of sacral region, unstageable: Secondary | ICD-10-CM | POA: Diagnosis not present

## 2021-01-29 DIAGNOSIS — I700 Atherosclerosis of aorta: Secondary | ICD-10-CM | POA: Diagnosis not present

## 2021-01-29 DIAGNOSIS — I5030 Unspecified diastolic (congestive) heart failure: Secondary | ICD-10-CM | POA: Diagnosis not present

## 2021-01-29 DIAGNOSIS — Z8673 Personal history of transient ischemic attack (TIA), and cerebral infarction without residual deficits: Secondary | ICD-10-CM | POA: Diagnosis not present

## 2021-01-29 DIAGNOSIS — Z85118 Personal history of other malignant neoplasm of bronchus and lung: Secondary | ICD-10-CM | POA: Diagnosis not present

## 2021-01-29 DIAGNOSIS — Z87891 Personal history of nicotine dependence: Secondary | ICD-10-CM | POA: Diagnosis not present

## 2021-01-29 DIAGNOSIS — I222 Subsequent non-ST elevation (NSTEMI) myocardial infarction: Secondary | ICD-10-CM | POA: Diagnosis not present

## 2021-01-29 DIAGNOSIS — F32A Depression, unspecified: Secondary | ICD-10-CM | POA: Diagnosis not present

## 2021-01-29 DIAGNOSIS — I480 Paroxysmal atrial fibrillation: Secondary | ICD-10-CM | POA: Diagnosis not present

## 2021-01-29 DIAGNOSIS — Z7982 Long term (current) use of aspirin: Secondary | ICD-10-CM | POA: Diagnosis not present

## 2021-01-31 DIAGNOSIS — Z9181 History of falling: Secondary | ICD-10-CM | POA: Diagnosis not present

## 2021-01-31 DIAGNOSIS — Z8673 Personal history of transient ischemic attack (TIA), and cerebral infarction without residual deficits: Secondary | ICD-10-CM | POA: Diagnosis not present

## 2021-01-31 DIAGNOSIS — D649 Anemia, unspecified: Secondary | ICD-10-CM | POA: Diagnosis not present

## 2021-01-31 DIAGNOSIS — I222 Subsequent non-ST elevation (NSTEMI) myocardial infarction: Secondary | ICD-10-CM | POA: Diagnosis not present

## 2021-01-31 DIAGNOSIS — I2510 Atherosclerotic heart disease of native coronary artery without angina pectoris: Secondary | ICD-10-CM | POA: Diagnosis not present

## 2021-01-31 DIAGNOSIS — E7800 Pure hypercholesterolemia, unspecified: Secondary | ICD-10-CM | POA: Diagnosis not present

## 2021-01-31 DIAGNOSIS — I110 Hypertensive heart disease with heart failure: Secondary | ICD-10-CM | POA: Diagnosis not present

## 2021-01-31 DIAGNOSIS — Z7982 Long term (current) use of aspirin: Secondary | ICD-10-CM | POA: Diagnosis not present

## 2021-01-31 DIAGNOSIS — I700 Atherosclerosis of aorta: Secondary | ICD-10-CM | POA: Diagnosis not present

## 2021-01-31 DIAGNOSIS — I480 Paroxysmal atrial fibrillation: Secondary | ICD-10-CM | POA: Diagnosis not present

## 2021-01-31 DIAGNOSIS — K5790 Diverticulosis of intestine, part unspecified, without perforation or abscess without bleeding: Secondary | ICD-10-CM | POA: Diagnosis not present

## 2021-01-31 DIAGNOSIS — Z85118 Personal history of other malignant neoplasm of bronchus and lung: Secondary | ICD-10-CM | POA: Diagnosis not present

## 2021-01-31 DIAGNOSIS — L89150 Pressure ulcer of sacral region, unstageable: Secondary | ICD-10-CM | POA: Diagnosis not present

## 2021-01-31 DIAGNOSIS — M1990 Unspecified osteoarthritis, unspecified site: Secondary | ICD-10-CM | POA: Diagnosis not present

## 2021-01-31 DIAGNOSIS — J439 Emphysema, unspecified: Secondary | ICD-10-CM | POA: Diagnosis not present

## 2021-01-31 DIAGNOSIS — G9341 Metabolic encephalopathy: Secondary | ICD-10-CM | POA: Diagnosis not present

## 2021-01-31 DIAGNOSIS — I5030 Unspecified diastolic (congestive) heart failure: Secondary | ICD-10-CM | POA: Diagnosis not present

## 2021-01-31 DIAGNOSIS — K219 Gastro-esophageal reflux disease without esophagitis: Secondary | ICD-10-CM | POA: Diagnosis not present

## 2021-01-31 DIAGNOSIS — E876 Hypokalemia: Secondary | ICD-10-CM | POA: Diagnosis not present

## 2021-01-31 DIAGNOSIS — F32A Depression, unspecified: Secondary | ICD-10-CM | POA: Diagnosis not present

## 2021-01-31 DIAGNOSIS — Z452 Encounter for adjustment and management of vascular access device: Secondary | ICD-10-CM | POA: Diagnosis not present

## 2021-01-31 DIAGNOSIS — Z87891 Personal history of nicotine dependence: Secondary | ICD-10-CM | POA: Diagnosis not present

## 2021-02-01 DIAGNOSIS — K219 Gastro-esophageal reflux disease without esophagitis: Secondary | ICD-10-CM | POA: Diagnosis not present

## 2021-02-01 DIAGNOSIS — I471 Supraventricular tachycardia: Secondary | ICD-10-CM | POA: Diagnosis not present

## 2021-02-01 DIAGNOSIS — D539 Nutritional anemia, unspecified: Secondary | ICD-10-CM | POA: Diagnosis not present

## 2021-02-01 DIAGNOSIS — L89153 Pressure ulcer of sacral region, stage 3: Secondary | ICD-10-CM | POA: Diagnosis not present

## 2021-02-01 DIAGNOSIS — K5790 Diverticulosis of intestine, part unspecified, without perforation or abscess without bleeding: Secondary | ICD-10-CM | POA: Diagnosis not present

## 2021-02-01 DIAGNOSIS — Z85118 Personal history of other malignant neoplasm of bronchus and lung: Secondary | ICD-10-CM | POA: Diagnosis not present

## 2021-02-01 DIAGNOSIS — E876 Hypokalemia: Secondary | ICD-10-CM | POA: Diagnosis not present

## 2021-02-01 DIAGNOSIS — M1990 Unspecified osteoarthritis, unspecified site: Secondary | ICD-10-CM | POA: Diagnosis not present

## 2021-02-01 DIAGNOSIS — Z87891 Personal history of nicotine dependence: Secondary | ICD-10-CM | POA: Diagnosis not present

## 2021-02-01 DIAGNOSIS — G9341 Metabolic encephalopathy: Secondary | ICD-10-CM | POA: Diagnosis not present

## 2021-02-01 DIAGNOSIS — I480 Paroxysmal atrial fibrillation: Secondary | ICD-10-CM | POA: Diagnosis not present

## 2021-02-01 DIAGNOSIS — Z9181 History of falling: Secondary | ICD-10-CM | POA: Diagnosis not present

## 2021-02-01 DIAGNOSIS — I700 Atherosclerosis of aorta: Secondary | ICD-10-CM | POA: Diagnosis not present

## 2021-02-01 DIAGNOSIS — E538 Deficiency of other specified B group vitamins: Secondary | ICD-10-CM | POA: Diagnosis not present

## 2021-02-01 DIAGNOSIS — E7800 Pure hypercholesterolemia, unspecified: Secondary | ICD-10-CM | POA: Diagnosis not present

## 2021-02-01 DIAGNOSIS — J439 Emphysema, unspecified: Secondary | ICD-10-CM | POA: Diagnosis not present

## 2021-02-01 DIAGNOSIS — Z7982 Long term (current) use of aspirin: Secondary | ICD-10-CM | POA: Diagnosis not present

## 2021-02-01 DIAGNOSIS — I10 Essential (primary) hypertension: Secondary | ICD-10-CM | POA: Diagnosis not present

## 2021-02-01 DIAGNOSIS — F32A Depression, unspecified: Secondary | ICD-10-CM | POA: Diagnosis not present

## 2021-02-01 DIAGNOSIS — Z8673 Personal history of transient ischemic attack (TIA), and cerebral infarction without residual deficits: Secondary | ICD-10-CM | POA: Diagnosis not present

## 2021-02-01 DIAGNOSIS — I2510 Atherosclerotic heart disease of native coronary artery without angina pectoris: Secondary | ICD-10-CM | POA: Diagnosis not present

## 2021-02-01 DIAGNOSIS — J449 Chronic obstructive pulmonary disease, unspecified: Secondary | ICD-10-CM | POA: Diagnosis not present

## 2021-02-01 DIAGNOSIS — L89150 Pressure ulcer of sacral region, unstageable: Secondary | ICD-10-CM | POA: Diagnosis not present

## 2021-02-01 DIAGNOSIS — Z452 Encounter for adjustment and management of vascular access device: Secondary | ICD-10-CM | POA: Diagnosis not present

## 2021-02-01 DIAGNOSIS — C3491 Malignant neoplasm of unspecified part of right bronchus or lung: Secondary | ICD-10-CM | POA: Diagnosis not present

## 2021-02-01 DIAGNOSIS — D649 Anemia, unspecified: Secondary | ICD-10-CM | POA: Diagnosis not present

## 2021-02-01 DIAGNOSIS — E785 Hyperlipidemia, unspecified: Secondary | ICD-10-CM | POA: Diagnosis not present

## 2021-02-01 DIAGNOSIS — I110 Hypertensive heart disease with heart failure: Secondary | ICD-10-CM | POA: Diagnosis not present

## 2021-02-01 DIAGNOSIS — I5030 Unspecified diastolic (congestive) heart failure: Secondary | ICD-10-CM | POA: Diagnosis not present

## 2021-02-01 DIAGNOSIS — G4700 Insomnia, unspecified: Secondary | ICD-10-CM | POA: Diagnosis not present

## 2021-02-01 DIAGNOSIS — I222 Subsequent non-ST elevation (NSTEMI) myocardial infarction: Secondary | ICD-10-CM | POA: Diagnosis not present

## 2021-02-01 NOTE — Progress Notes (Signed)
[facility]  [address] [place],  [place]  [address] [phone]  Clinic Day:  02/07/2021  Referring physician: Lowella Dandy, NP  This document serves as a record of services personally performed by Hosie Poisson, MD. It was created on their behalf by Curry,Lauren E, a trained medical scribe. The creation of this record is based on the scribe's personal observations and the provider's statements to them.  ASSESSMENT & PLAN:   Assessment & Plan:  Stage IIIB (T1b N3 M0) squamous cell lung cancer, diagnosed in June 2022.  She received concurrent chemoradiation and completed 6 cycles of carboplatin/Abraxane.  I do not feel she needs further chemotherapy at this time.  She completed radiation therapy on September 6th, and CT imaging has shown a good response. She started maintenance immunotherapy with durvalumab for 1 year on October 11th and only received 2 cycles.   2.  COVID infection, August 2022, requiring hospitalization for pneumonia from November 8th to December 1st. She is slowly returning back to baseline. She continues physical therapy at home and is working on her nutrition.  3.  Osteopenia.  She is up to date on bone density as of April 2022, which are scheduled through Dr. Delena Bali office.   4.  Significant co-morbidities.  Anemia, probably largely from chemotherapy. This has improved.  Respiratory infection with productive cough with yellow sputum. I will place her on a Z-pack.  She is doing better, but has not fully returned back to baseline. She remains weak and continues physical therapy at home. She will continue to work on her nutrition. In view of her productive cough, I will place her on a Z-pack. We will plan to see her back in 3-4 weeks with CBC, CMP, CEA and CT chest to reassess her disease baseline. At that time, we can make a decision on further treatment or proceeding with surveillance. Her last dose of immunotherapy was on  October 27th. She and her husband understand and agree with this plan of care. I have answered their questions and they know to call with any concerns.   I provided 20 minutes of face-to-face time during this this encounter and > 50% was spent counseling as documented under my assessment and plan.     [facility] [address] [place] [address] Dept: [phone] Dept Fax: [phone]   No orders of the defined types were placed in this encounter.      CHIEF COMPLAINT:  CC: Stage IIIB squamous cell lung cancer  Current Treatment:  Maintenance durvalumab   HISTORY OF PRESENT ILLNESS:  Stage IIIB (T1b, N3, M0) squamous cell lung cancer diagnosed in June 2022.  This began when the patient started to experience intermittent chest pain and shortness of breath, and followed up with cardiology for further evaluation.  Chest x-ray was abnormal.  Due to her history of COPD and long history of smoking she was referred to pulmonology.  CT chest from May revealed a new highly suspicious spiculated pulmonary nodule within the superior lateral aspect of the right lower lobe measuring 1.5 x 1.0 x 1.4 cm with central small cavitation highly suspicious to be a neoplastic nodule.  There was also nodularity along the upper wall of the left mainstem bronchus of uncertain etiology but also suspicious for additional neoplastic process.  CT super D of the chest from June revealed enlargement of the thick-walled spiculated cavitary nodule  in the superior segment of the right lower lobe measuring 1.6 x 1.6.  There was also numerous mediastinal lymph nodes some of which are enlarged including high AP window lymph node measuring 1.2 cm, lower right paratracheal lymph node measuring 1.4 cm. Final pathology confirmed malignant cells consistent with non-small cell carcinoma. Overall, the morphology and immunophenotype favor a squamous  cell carcinoma.  Staging PET scan showed the hypermetabolic right lower lobe lung lesion consistent with primary lung neoplasm.  There was also mediastinal and hilar lymphadenopathy but no findings to suggest abdominal/pelvic metastatic disease or osseous metastatic disease for a T1b N3 M0, stage IIIB.  MRI brain was negative for metastasis.  She was referred to Dr. Julien Nordmann, oncology, and was started concurrent chemoradiation with weekly carboplatin/paclitaxel.  Her 1st cycle was July 11th.  With the 3rd cycle, she developed a reaction to paclitaxel and so was switched to Abraxane.  She has received 6 cycles of therapy.    She has a medical history significant for coronary artery disease status post stent placement, carotid artery disease, colon obstruction status post surgical intervention, depression, COPD, DVT, GERD, intracranial hemorrhage, hypertension, strokes, anemia and Barrett's esophagus. [name] states that she has quit smoking since her diagnosis in June after 60 pack years.  She contracted COVID back in August and was quite ill and required hospitalization for 5 days.  She has had hypokalemia and supplement was ordered.   Oncology History  Malignant neoplasm of bronchus of right lower lobe ([facility])  07/10/2020 Initial Diagnosis   Stage III squamous cell carcinoma of right lung ([facility])   07/10/2020 Cancer Staging   Staging form: Lung, AJCC 8th Edition - Clinical: Stage IIIB (cT1b, cN3, cM0) - Signed by Curt Bears, MD on 07/10/2020    07/30/2020 - 09/17/2020 Chemotherapy          10/25/2020 Imaging   CT chest: 1. Interval decrease in size of the right lower lobe lung lesion.  2. Progressive airspace opacity and atelectasis in the left upper  lobe.  3. Persistent small left effusion and left lower lobe atelectasis or  infiltrate.  4. New 15.5 mm sub solid nodular lesion in the right upper lobe just above the minor fissure. It is possible this is an area of  inflammation but attention on  future studies is suggested. Recommend follow-up noncontrast chest CT in 3-4 months.  5. Stable emphysematous changes and pulmonary scarring.  6. Slightly smaller borderline enlarged mediastinal and hilar lymph  nodes.  7. Stable atherosclerotic calcifications involving the aorta and  branch vessels including three-vessel coronary artery calcifications.    10/30/2020 -  Chemotherapy   Patient is on Treatment Plan : LUNG Durvalumab q14d        INTERVAL HISTORY:  [name] is here for follow up after a lengthy hospital admission due to pneumonia and acute respiratory failure. She and her family wished full code and so she was intubated and sedated with propofol and was in the intensive care unit for a time. She was responding to treatment for her lung cancer and we had planned maintenance immunotherapy, but she is immunocompromised and has many comorbidities. She was in septic shock but was eventually weaned off the levophed and slowly recovered.  She initially had worsening bilateral infiltrates on CXR, but then had improvement. She was admitted from November 8th to December 1st. She has received 2 doses of durvalumab as maintenance, with the last dose at the end of October before she was admitted to the hospital.  She was placed on home physical therapy, which she continues, but she states that she remains weak. She is able to walk short distances with assistance. She continues to have a bedsore at the bottom of her spine which is painful, and she rates this as an 8/10. She has home oxygen to use as needed. She has had productive cough with yellow sputum for the past 1-2 weeks. Blood counts and chemistries are unremarkable except for a hemoglobin of 11.1, and a BUN of 21. Her  appetite is good, and she has lost 16 pounds since her last visit, nearly 3 months ago.  She denies fever, chills or other signs of infection.  She denies nausea, vomiting, bowel issues, or abdominal pain.  She denies sore throat,  dyspnea, or chest pain.  REVIEW OF SYSTEMS:  Review of Systems  Constitutional:  Positive for fatigue. Negative for appetite change, chills, fever and unexpected weight change.       Generalized weakness, moderate  HENT:  Negative.    Eyes: Negative.   Respiratory:  Positive for cough (productive with yellow sputum). Negative for chest tightness, hemoptysis, shortness of breath and wheezing.   Cardiovascular: Negative.  Negative for chest pain, leg swelling and palpitations.  Gastrointestinal: Negative.  Negative for abdominal distention, abdominal pain, blood in stool, constipation, diarrhea, nausea and vomiting.  Endocrine: Negative.   Genitourinary: Negative.  Negative for difficulty urinating, dysuria, frequency and hematuria.   Musculoskeletal:  Negative for arthralgias, back pain, flank pain, gait problem and myalgias.  Skin:  Positive for wound (bed sore at the base of her spine).  Neurological:  Positive for extremity weakness (using a wheelchair). Negative for dizziness, gait problem, headaches, light-headedness, numbness, seizures and speech difficulty.  Hematological: Negative.   Psychiatric/Behavioral: Negative.  Negative for depression and sleep disturbance. The patient is not nervous/anxious.     VITALS:  Blood pressure 129/66, pulse 98, temperature 98.3 F (36.8 C), temperature source Oral, resp. rate 20, height 4\' 11"  (1.499 m), weight 129 lb 9.6 oz (58.8 kg), SpO2 94 %.  Wt Readings from Last 3 Encounters:  02/07/21 129 lb 9.6 oz (58.8 kg)  11/22/20 145 lb 4 oz (65.9 kg)  11/15/20 145 lb 4 oz (65.9 kg)    Body mass index is 26.18 kg/m.  Performance status (ECOG): 1 - Symptomatic but completely ambulatory  PHYSICAL EXAM:  Physical Exam Constitutional:      General: She is not in acute distress.    Appearance: Normal appearance. She is normal weight.  HENT:     Head: Normocephalic and atraumatic.  Eyes:     General: No scleral icterus.    Extraocular Movements:  Extraocular movements intact.     Conjunctiva/sclera: Conjunctivae normal.     Pupils: Pupils are equal, round, and reactive to light.  Cardiovascular:     Rate and Rhythm: Normal rate and regular rhythm.     Pulses: Normal pulses.     Heart sounds: Normal heart sounds. No murmur heard.   No friction rub. No gallop.  Pulmonary:     Effort: Pulmonary effort is normal. No respiratory distress.     Breath sounds: Normal breath sounds.  Abdominal:     General: Bowel sounds are normal. There is no distension.     Palpations: Abdomen is soft. There is no hepatomegaly, splenomegaly or mass.     Tenderness: There is no abdominal tenderness.  Musculoskeletal:        General: Normal range of motion.  Cervical back: Normal range of motion and neck supple.     Right lower leg: No edema.     Left lower leg: No edema.  Lymphadenopathy:     Cervical: No cervical adenopathy.  Skin:    General: Skin is warm and dry.  Neurological:     General: No focal deficit present.     Mental Status: She is alert and oriented to person, place, and time. Mental status is at baseline.  Psychiatric:        Mood and Affect: Mood normal.        Behavior: Behavior normal.        Thought Content: Thought content normal.        Judgment: Judgment normal.    LABS:   CBC Latest Ref Rng & Units 02/07/2021 11/13/2020 10/25/2020  WBC - 7.3 3.9 5.0  Hemoglobin 12.0 - 16.0 11.1(A) 9.8(A) 10.0(A)  Hematocrit 36 - 46 34(A) 31(A) 31(A)  Platelets 150 - 399 223 164 136(A)   CMP Latest Ref Rng & Units 02/07/2021 11/13/2020 10/25/2020  Glucose 70 - 99 mg/dL - - -  BUN 4 - 21 21 15 17   Creatinine 0.5 - 1.1 0.8 0.7 0.8  Sodium 137 - 147 138 141 142  Potassium 3.4 - 5.3 3.6 3.5 3.8  Chloride 99 - 108 106 110(A) 110(A)  CO2 13 - 22 23(A) 22 24(A)  Calcium 8.7 - 10.7 9.4 8.5(A) 8.9  Total Protein 6.5 - 8.1 g/dL - - -  Total Bilirubin 0.3 - 1.2 mg/dL - - -  Alkaline Phos 25 - 125 92 75 74  AST 13 - 35 29 25 26   ALT 7 -  35 32 12 14     Lab Results  Component Value Date   CEA1 6.9 (H) 09/25/2020   /  CEA  Date Value Ref Range Status  09/25/2020 6.9 (H) 0.0 - 4.7 ng/mL Final    Comment:    (NOTE)                             Nonsmokers          <3.9                             Smokers             <5.6 [employer] Electrochemiluminescence Immunoassay (ECLIA) Values obtained with different assay methods or kits cannot be used interchangeably.  Results cannot be interpreted as absolute evidence of the presence or absence of malignant disease. Performed At: [facility] [place], [place] [id number] Rush Farmer MD ME:[id number]     Lab Results  Component Value Date   TIBC 417 11/13/2020   FERRITIN 29 11/13/2020   IRONPCTSAT 22 11/13/2020   IRONPCTSAT 7.7 (L) 06/19/2020   Lab Results  Component Value Date   LDH 166 06/19/2020    STUDIES:  No results found.     EXAM:  CT CHEST WITHOUT CONTRAST  TECHNIQUE:  Multidetector CT imaging of the chest was performed following the  standard protocol without IV contrast.  COMPARISON: November 23, 2020.  FINDINGS:  Cardiovascular: Atherosclerosis of thoracic aorta is noted without  aneurysm formation. Mild cardiomegaly is noted. Minimal pericardial  effusion is noted. Coronary artery calcifications are noted.  Mediastinum/Nodes: Thyroid gland is unremarkable. Enlarged  pretracheal lymph node is noted measuring 17 mm which is enlarged  compared to prior exam. This most likely is inflammatory in  etiology. Moderate size sliding-type hiatal hernia is noted.  Lungs/Pleura: Small bilateral pleural effusions are noted. No  pneumothorax is noted. Emphysematous disease is noted bilaterally.  Significantly increased bilateral upper lobe and right lower lobe  opacities are noted concerning for worsening pneumonia. Stable 9 mm  spiculated density is noted in right lower lobe best seen on image  number 62 of series 4.   Upper Abdomen: No acute abnormality.  Musculoskeletal: No chest wall mass or suspicious bone lesions  identified.   IMPRESSION:  Significant increased bilateral upper lobe and right lower lobe  airspace opacities are noted concerning for worsening multifocal  pneumonia.  Small bilateral pleural effusions are noted.  Stable 9 mm spiculated density is seen in right lower lobe. Consider  one of the following in 3 months for both low-risk and high-risk  individuals: (a) repeat chest CT, (b) follow-up PET-CT, or (c)  tissue sampling. This recommendation follows the consensus  statement: Guidelines for Management of Incidental Pulmonary Nodules  Detected on CT Images: From the Fleischner Society 2017; Radiology  2017; 284:228-243.  Coronary artery calcifications are noted suggesting coronary artery  disease.  Minimal pericardial effusion.  Moderate size sliding-type hiatal hernia.  Enlarged pretracheal lymph node is noted which most likely is  inflammatory in etiology.  Aortic Atherosclerosis (ICD10-I70.0) and Emphysema (ICD10-J43.9).   HISTORY:   Allergies:  Allergies  Allergen Reactions   Pregabalin Swelling    Tongue swelling   Paclitaxel    Sulfonamide Derivatives Swelling    Childhood reaction    Current Medications: Current Outpatient Medications  Medication Sig Dispense Refill   aspirin EC 81 MG tablet Take 2 tablets (162 mg total) by mouth in the morning. Okay to restart this medication on 07/04/2020 30 tablet 11   atorvastatin (LIPITOR) 80 MG tablet Take 80 mg by mouth every evening.      buPROPion (WELLBUTRIN XL) 150 MG 24 hr tablet Take 1 tablet (150 mg total) by mouth daily. 90 tablet 3   fish oil-omega-3 fatty acids 1000 MG capsule Take 1,000 mg by mouth in the morning.     fluticasone (FLONASE) 50 MCG/ACT nasal spray Place 1 spray into both nostrils daily as needed for allergies.     folic acid (FOLVITE) 542 MCG tablet Take 800 mcg by mouth in the morning.      metoprolol succinate (TOPROL-XL) 25 MG 24 hr tablet Take by mouth.     mirtazapine (REMERON) 45 MG tablet Take 45 mg by mouth at bedtime.      nitroGLYCERIN (NITROSTAT) 0.4 MG SL tablet DISSOLVE 1 TABLET UNDER THE TONGUE EVERY 5 MINUTES AS  NEEDED FOR CHEST PAIN. MAX  OF 3 TABLETS IN 15 MINUTES. CALL 911 IF PAIN PERSISTS. 100 tablet 3   ondansetron (ZOFRAN) 4 MG tablet Take 4 mg by mouth every 8 (eight) hours as needed for nausea or vomiting.     oxyCODONE (OXY IR/ROXICODONE) 5 MG immediate release tablet Take by mouth.     pantoprazole (PROTONIX) 40 MG tablet Take 40 mg by mouth in the morning.     potassium chloride SA (KLOR-CON M) 20 MEQ tablet Take 20 mEq by mouth daily.     sertraline (ZOLOFT) 100 MG tablet Take 200 mg by mouth in the morning.     zolpidem (AMBIEN) 5 MG tablet Take 1 tablet (5 mg total) by mouth at bedtime as needed for sleep. 30 tablet 0   No  current facility-administered medications for this visit.    I, Rita Ohara, am acting as scribe for Derwood Kaplan, MD  I have reviewed this report as typed by the medical scribe, and it is complete and accurate.

## 2021-02-04 ENCOUNTER — Other Ambulatory Visit: Payer: Medicare Other

## 2021-02-05 DIAGNOSIS — F32A Depression, unspecified: Secondary | ICD-10-CM | POA: Diagnosis not present

## 2021-02-05 DIAGNOSIS — J439 Emphysema, unspecified: Secondary | ICD-10-CM | POA: Diagnosis not present

## 2021-02-05 DIAGNOSIS — Z452 Encounter for adjustment and management of vascular access device: Secondary | ICD-10-CM | POA: Diagnosis not present

## 2021-02-05 DIAGNOSIS — I700 Atherosclerosis of aorta: Secondary | ICD-10-CM | POA: Diagnosis not present

## 2021-02-05 DIAGNOSIS — I110 Hypertensive heart disease with heart failure: Secondary | ICD-10-CM | POA: Diagnosis not present

## 2021-02-05 DIAGNOSIS — E7800 Pure hypercholesterolemia, unspecified: Secondary | ICD-10-CM | POA: Diagnosis not present

## 2021-02-05 DIAGNOSIS — D649 Anemia, unspecified: Secondary | ICD-10-CM | POA: Diagnosis not present

## 2021-02-05 DIAGNOSIS — E876 Hypokalemia: Secondary | ICD-10-CM | POA: Diagnosis not present

## 2021-02-05 DIAGNOSIS — Z7982 Long term (current) use of aspirin: Secondary | ICD-10-CM | POA: Diagnosis not present

## 2021-02-05 DIAGNOSIS — I2510 Atherosclerotic heart disease of native coronary artery without angina pectoris: Secondary | ICD-10-CM | POA: Diagnosis not present

## 2021-02-05 DIAGNOSIS — Z85118 Personal history of other malignant neoplasm of bronchus and lung: Secondary | ICD-10-CM | POA: Diagnosis not present

## 2021-02-05 DIAGNOSIS — K5790 Diverticulosis of intestine, part unspecified, without perforation or abscess without bleeding: Secondary | ICD-10-CM | POA: Diagnosis not present

## 2021-02-05 DIAGNOSIS — Z9181 History of falling: Secondary | ICD-10-CM | POA: Diagnosis not present

## 2021-02-05 DIAGNOSIS — K219 Gastro-esophageal reflux disease without esophagitis: Secondary | ICD-10-CM | POA: Diagnosis not present

## 2021-02-05 DIAGNOSIS — I222 Subsequent non-ST elevation (NSTEMI) myocardial infarction: Secondary | ICD-10-CM | POA: Diagnosis not present

## 2021-02-05 DIAGNOSIS — I480 Paroxysmal atrial fibrillation: Secondary | ICD-10-CM | POA: Diagnosis not present

## 2021-02-05 DIAGNOSIS — Z87891 Personal history of nicotine dependence: Secondary | ICD-10-CM | POA: Diagnosis not present

## 2021-02-05 DIAGNOSIS — G9341 Metabolic encephalopathy: Secondary | ICD-10-CM | POA: Diagnosis not present

## 2021-02-05 DIAGNOSIS — I5030 Unspecified diastolic (congestive) heart failure: Secondary | ICD-10-CM | POA: Diagnosis not present

## 2021-02-05 DIAGNOSIS — Z8673 Personal history of transient ischemic attack (TIA), and cerebral infarction without residual deficits: Secondary | ICD-10-CM | POA: Diagnosis not present

## 2021-02-05 DIAGNOSIS — M1990 Unspecified osteoarthritis, unspecified site: Secondary | ICD-10-CM | POA: Diagnosis not present

## 2021-02-05 DIAGNOSIS — L89150 Pressure ulcer of sacral region, unstageable: Secondary | ICD-10-CM | POA: Diagnosis not present

## 2021-02-06 DIAGNOSIS — I480 Paroxysmal atrial fibrillation: Secondary | ICD-10-CM | POA: Diagnosis not present

## 2021-02-06 DIAGNOSIS — Z8673 Personal history of transient ischemic attack (TIA), and cerebral infarction without residual deficits: Secondary | ICD-10-CM | POA: Diagnosis not present

## 2021-02-06 DIAGNOSIS — L89150 Pressure ulcer of sacral region, unstageable: Secondary | ICD-10-CM | POA: Diagnosis not present

## 2021-02-06 DIAGNOSIS — I700 Atherosclerosis of aorta: Secondary | ICD-10-CM | POA: Diagnosis not present

## 2021-02-06 DIAGNOSIS — Z85118 Personal history of other malignant neoplasm of bronchus and lung: Secondary | ICD-10-CM | POA: Diagnosis not present

## 2021-02-06 DIAGNOSIS — I5030 Unspecified diastolic (congestive) heart failure: Secondary | ICD-10-CM | POA: Diagnosis not present

## 2021-02-06 DIAGNOSIS — Z9181 History of falling: Secondary | ICD-10-CM | POA: Diagnosis not present

## 2021-02-06 DIAGNOSIS — K219 Gastro-esophageal reflux disease without esophagitis: Secondary | ICD-10-CM | POA: Diagnosis not present

## 2021-02-06 DIAGNOSIS — K5790 Diverticulosis of intestine, part unspecified, without perforation or abscess without bleeding: Secondary | ICD-10-CM | POA: Diagnosis not present

## 2021-02-06 DIAGNOSIS — F32A Depression, unspecified: Secondary | ICD-10-CM | POA: Diagnosis not present

## 2021-02-06 DIAGNOSIS — E7800 Pure hypercholesterolemia, unspecified: Secondary | ICD-10-CM | POA: Diagnosis not present

## 2021-02-06 DIAGNOSIS — Z87891 Personal history of nicotine dependence: Secondary | ICD-10-CM | POA: Diagnosis not present

## 2021-02-06 DIAGNOSIS — I222 Subsequent non-ST elevation (NSTEMI) myocardial infarction: Secondary | ICD-10-CM | POA: Diagnosis not present

## 2021-02-06 DIAGNOSIS — G9341 Metabolic encephalopathy: Secondary | ICD-10-CM | POA: Diagnosis not present

## 2021-02-06 DIAGNOSIS — Z452 Encounter for adjustment and management of vascular access device: Secondary | ICD-10-CM | POA: Diagnosis not present

## 2021-02-06 DIAGNOSIS — M1990 Unspecified osteoarthritis, unspecified site: Secondary | ICD-10-CM | POA: Diagnosis not present

## 2021-02-06 DIAGNOSIS — J439 Emphysema, unspecified: Secondary | ICD-10-CM | POA: Diagnosis not present

## 2021-02-06 DIAGNOSIS — Z7982 Long term (current) use of aspirin: Secondary | ICD-10-CM | POA: Diagnosis not present

## 2021-02-06 DIAGNOSIS — E876 Hypokalemia: Secondary | ICD-10-CM | POA: Diagnosis not present

## 2021-02-06 DIAGNOSIS — I2510 Atherosclerotic heart disease of native coronary artery without angina pectoris: Secondary | ICD-10-CM | POA: Diagnosis not present

## 2021-02-06 DIAGNOSIS — I110 Hypertensive heart disease with heart failure: Secondary | ICD-10-CM | POA: Diagnosis not present

## 2021-02-06 DIAGNOSIS — D649 Anemia, unspecified: Secondary | ICD-10-CM | POA: Diagnosis not present

## 2021-02-07 ENCOUNTER — Other Ambulatory Visit: Payer: Self-pay | Admitting: Hematology and Oncology

## 2021-02-07 ENCOUNTER — Other Ambulatory Visit: Payer: Self-pay | Admitting: Oncology

## 2021-02-07 ENCOUNTER — Other Ambulatory Visit: Payer: Self-pay

## 2021-02-07 ENCOUNTER — Inpatient Hospital Stay: Payer: Medicare Other | Attending: Internal Medicine | Admitting: Oncology

## 2021-02-07 ENCOUNTER — Inpatient Hospital Stay: Payer: Medicare Other

## 2021-02-07 ENCOUNTER — Encounter: Payer: Self-pay | Admitting: Oncology

## 2021-02-07 VITALS — BP 129/66 | HR 98 | Temp 98.3°F | Resp 20 | Ht 59.0 in | Wt 129.6 lb

## 2021-02-07 DIAGNOSIS — C3431 Malignant neoplasm of lower lobe, right bronchus or lung: Secondary | ICD-10-CM | POA: Diagnosis not present

## 2021-02-07 DIAGNOSIS — I480 Paroxysmal atrial fibrillation: Secondary | ICD-10-CM | POA: Diagnosis not present

## 2021-02-07 DIAGNOSIS — M1990 Unspecified osteoarthritis, unspecified site: Secondary | ICD-10-CM | POA: Diagnosis not present

## 2021-02-07 DIAGNOSIS — I110 Hypertensive heart disease with heart failure: Secondary | ICD-10-CM | POA: Diagnosis not present

## 2021-02-07 DIAGNOSIS — K5790 Diverticulosis of intestine, part unspecified, without perforation or abscess without bleeding: Secondary | ICD-10-CM | POA: Diagnosis not present

## 2021-02-07 DIAGNOSIS — G9341 Metabolic encephalopathy: Secondary | ICD-10-CM | POA: Diagnosis not present

## 2021-02-07 DIAGNOSIS — L89150 Pressure ulcer of sacral region, unstageable: Secondary | ICD-10-CM | POA: Diagnosis not present

## 2021-02-07 DIAGNOSIS — D649 Anemia, unspecified: Secondary | ICD-10-CM | POA: Diagnosis not present

## 2021-02-07 DIAGNOSIS — K219 Gastro-esophageal reflux disease without esophagitis: Secondary | ICD-10-CM | POA: Diagnosis not present

## 2021-02-07 DIAGNOSIS — Z8673 Personal history of transient ischemic attack (TIA), and cerebral infarction without residual deficits: Secondary | ICD-10-CM | POA: Diagnosis not present

## 2021-02-07 DIAGNOSIS — E7800 Pure hypercholesterolemia, unspecified: Secondary | ICD-10-CM | POA: Diagnosis not present

## 2021-02-07 DIAGNOSIS — J439 Emphysema, unspecified: Secondary | ICD-10-CM | POA: Diagnosis not present

## 2021-02-07 DIAGNOSIS — I222 Subsequent non-ST elevation (NSTEMI) myocardial infarction: Secondary | ICD-10-CM | POA: Diagnosis not present

## 2021-02-07 DIAGNOSIS — Z9181 History of falling: Secondary | ICD-10-CM | POA: Diagnosis not present

## 2021-02-07 DIAGNOSIS — Z87891 Personal history of nicotine dependence: Secondary | ICD-10-CM | POA: Diagnosis not present

## 2021-02-07 DIAGNOSIS — Z7982 Long term (current) use of aspirin: Secondary | ICD-10-CM | POA: Diagnosis not present

## 2021-02-07 DIAGNOSIS — Z452 Encounter for adjustment and management of vascular access device: Secondary | ICD-10-CM | POA: Diagnosis not present

## 2021-02-07 DIAGNOSIS — E876 Hypokalemia: Secondary | ICD-10-CM | POA: Diagnosis not present

## 2021-02-07 DIAGNOSIS — Z85118 Personal history of other malignant neoplasm of bronchus and lung: Secondary | ICD-10-CM | POA: Diagnosis not present

## 2021-02-07 DIAGNOSIS — I700 Atherosclerosis of aorta: Secondary | ICD-10-CM | POA: Diagnosis not present

## 2021-02-07 DIAGNOSIS — I5030 Unspecified diastolic (congestive) heart failure: Secondary | ICD-10-CM | POA: Diagnosis not present

## 2021-02-07 DIAGNOSIS — I2510 Atherosclerotic heart disease of native coronary artery without angina pectoris: Secondary | ICD-10-CM | POA: Diagnosis not present

## 2021-02-07 DIAGNOSIS — F32A Depression, unspecified: Secondary | ICD-10-CM | POA: Diagnosis not present

## 2021-02-07 LAB — CBC
MCV: 90 (ref 81–99)
RBC: 3.8 — AB (ref 3.87–5.11)

## 2021-02-07 LAB — CBC AND DIFFERENTIAL
HCT: 34 — AB (ref 36–46)
Hemoglobin: 11.1 — AB (ref 12.0–16.0)
Neutrophils Absolute: 5.4
Platelets: 223 (ref 150–399)
WBC: 7.3

## 2021-02-07 LAB — BASIC METABOLIC PANEL
BUN: 21 (ref 4–21)
CO2: 23 — AB (ref 13–22)
Chloride: 106 (ref 99–108)
Creatinine: 0.8 (ref 0.5–1.1)
Glucose: 128
Potassium: 3.6 (ref 3.4–5.3)
Sodium: 138 (ref 137–147)

## 2021-02-07 LAB — HEPATIC FUNCTION PANEL
ALT: 32 (ref 7–35)
AST: 29 (ref 13–35)
Alkaline Phosphatase: 92 (ref 25–125)
Bilirubin, Total: 0.6

## 2021-02-07 LAB — COMPREHENSIVE METABOLIC PANEL
Albumin: 3.9 (ref 3.5–5.0)
Calcium: 9.4 (ref 8.7–10.7)

## 2021-02-07 LAB — TSH: TSH: 1.884 u[IU]/mL (ref 0.350–4.500)

## 2021-02-08 DIAGNOSIS — Z452 Encounter for adjustment and management of vascular access device: Secondary | ICD-10-CM | POA: Diagnosis not present

## 2021-02-08 DIAGNOSIS — I480 Paroxysmal atrial fibrillation: Secondary | ICD-10-CM | POA: Diagnosis not present

## 2021-02-08 DIAGNOSIS — Z7982 Long term (current) use of aspirin: Secondary | ICD-10-CM | POA: Diagnosis not present

## 2021-02-08 DIAGNOSIS — E7800 Pure hypercholesterolemia, unspecified: Secondary | ICD-10-CM | POA: Diagnosis not present

## 2021-02-08 DIAGNOSIS — K5790 Diverticulosis of intestine, part unspecified, without perforation or abscess without bleeding: Secondary | ICD-10-CM | POA: Diagnosis not present

## 2021-02-08 DIAGNOSIS — G9341 Metabolic encephalopathy: Secondary | ICD-10-CM | POA: Diagnosis not present

## 2021-02-08 DIAGNOSIS — Z87891 Personal history of nicotine dependence: Secondary | ICD-10-CM | POA: Diagnosis not present

## 2021-02-08 DIAGNOSIS — Z9181 History of falling: Secondary | ICD-10-CM | POA: Diagnosis not present

## 2021-02-08 DIAGNOSIS — J439 Emphysema, unspecified: Secondary | ICD-10-CM | POA: Diagnosis not present

## 2021-02-08 DIAGNOSIS — F32A Depression, unspecified: Secondary | ICD-10-CM | POA: Diagnosis not present

## 2021-02-08 DIAGNOSIS — I700 Atherosclerosis of aorta: Secondary | ICD-10-CM | POA: Diagnosis not present

## 2021-02-08 DIAGNOSIS — E876 Hypokalemia: Secondary | ICD-10-CM | POA: Diagnosis not present

## 2021-02-08 DIAGNOSIS — I110 Hypertensive heart disease with heart failure: Secondary | ICD-10-CM | POA: Diagnosis not present

## 2021-02-08 DIAGNOSIS — I222 Subsequent non-ST elevation (NSTEMI) myocardial infarction: Secondary | ICD-10-CM | POA: Diagnosis not present

## 2021-02-08 DIAGNOSIS — K219 Gastro-esophageal reflux disease without esophagitis: Secondary | ICD-10-CM | POA: Diagnosis not present

## 2021-02-08 DIAGNOSIS — I2510 Atherosclerotic heart disease of native coronary artery without angina pectoris: Secondary | ICD-10-CM | POA: Diagnosis not present

## 2021-02-08 DIAGNOSIS — Z85118 Personal history of other malignant neoplasm of bronchus and lung: Secondary | ICD-10-CM | POA: Diagnosis not present

## 2021-02-08 DIAGNOSIS — L89150 Pressure ulcer of sacral region, unstageable: Secondary | ICD-10-CM | POA: Diagnosis not present

## 2021-02-08 DIAGNOSIS — I5030 Unspecified diastolic (congestive) heart failure: Secondary | ICD-10-CM | POA: Diagnosis not present

## 2021-02-08 DIAGNOSIS — Z8673 Personal history of transient ischemic attack (TIA), and cerebral infarction without residual deficits: Secondary | ICD-10-CM | POA: Diagnosis not present

## 2021-02-08 DIAGNOSIS — M1990 Unspecified osteoarthritis, unspecified site: Secondary | ICD-10-CM | POA: Diagnosis not present

## 2021-02-08 DIAGNOSIS — D649 Anemia, unspecified: Secondary | ICD-10-CM | POA: Diagnosis not present

## 2021-02-08 LAB — T4: T4, Total: 5.7 ug/dL (ref 4.5–12.0)

## 2021-02-11 ENCOUNTER — Other Ambulatory Visit: Payer: Self-pay | Admitting: Hematology and Oncology

## 2021-02-11 ENCOUNTER — Other Ambulatory Visit: Payer: Self-pay | Admitting: Oncology

## 2021-02-11 DIAGNOSIS — G4700 Insomnia, unspecified: Secondary | ICD-10-CM

## 2021-02-11 DIAGNOSIS — I700 Atherosclerosis of aorta: Secondary | ICD-10-CM | POA: Diagnosis not present

## 2021-02-11 DIAGNOSIS — Z9181 History of falling: Secondary | ICD-10-CM | POA: Diagnosis not present

## 2021-02-11 DIAGNOSIS — D649 Anemia, unspecified: Secondary | ICD-10-CM | POA: Diagnosis not present

## 2021-02-11 DIAGNOSIS — K5790 Diverticulosis of intestine, part unspecified, without perforation or abscess without bleeding: Secondary | ICD-10-CM | POA: Diagnosis not present

## 2021-02-11 DIAGNOSIS — Z452 Encounter for adjustment and management of vascular access device: Secondary | ICD-10-CM | POA: Diagnosis not present

## 2021-02-11 DIAGNOSIS — M1990 Unspecified osteoarthritis, unspecified site: Secondary | ICD-10-CM | POA: Diagnosis not present

## 2021-02-11 DIAGNOSIS — E7800 Pure hypercholesterolemia, unspecified: Secondary | ICD-10-CM | POA: Diagnosis not present

## 2021-02-11 DIAGNOSIS — I480 Paroxysmal atrial fibrillation: Secondary | ICD-10-CM | POA: Diagnosis not present

## 2021-02-11 DIAGNOSIS — Z85118 Personal history of other malignant neoplasm of bronchus and lung: Secondary | ICD-10-CM | POA: Diagnosis not present

## 2021-02-11 DIAGNOSIS — Z8673 Personal history of transient ischemic attack (TIA), and cerebral infarction without residual deficits: Secondary | ICD-10-CM | POA: Diagnosis not present

## 2021-02-11 DIAGNOSIS — J019 Acute sinusitis, unspecified: Secondary | ICD-10-CM

## 2021-02-11 DIAGNOSIS — E876 Hypokalemia: Secondary | ICD-10-CM | POA: Diagnosis not present

## 2021-02-11 DIAGNOSIS — Z87891 Personal history of nicotine dependence: Secondary | ICD-10-CM | POA: Diagnosis not present

## 2021-02-11 DIAGNOSIS — G9341 Metabolic encephalopathy: Secondary | ICD-10-CM | POA: Diagnosis not present

## 2021-02-11 DIAGNOSIS — Z7982 Long term (current) use of aspirin: Secondary | ICD-10-CM | POA: Diagnosis not present

## 2021-02-11 DIAGNOSIS — L89150 Pressure ulcer of sacral region, unstageable: Secondary | ICD-10-CM | POA: Diagnosis not present

## 2021-02-11 DIAGNOSIS — K219 Gastro-esophageal reflux disease without esophagitis: Secondary | ICD-10-CM | POA: Diagnosis not present

## 2021-02-11 DIAGNOSIS — J439 Emphysema, unspecified: Secondary | ICD-10-CM | POA: Diagnosis not present

## 2021-02-11 DIAGNOSIS — I110 Hypertensive heart disease with heart failure: Secondary | ICD-10-CM | POA: Diagnosis not present

## 2021-02-11 DIAGNOSIS — I222 Subsequent non-ST elevation (NSTEMI) myocardial infarction: Secondary | ICD-10-CM | POA: Diagnosis not present

## 2021-02-11 DIAGNOSIS — I2510 Atherosclerotic heart disease of native coronary artery without angina pectoris: Secondary | ICD-10-CM | POA: Diagnosis not present

## 2021-02-11 DIAGNOSIS — I5030 Unspecified diastolic (congestive) heart failure: Secondary | ICD-10-CM | POA: Diagnosis not present

## 2021-02-11 DIAGNOSIS — F32A Depression, unspecified: Secondary | ICD-10-CM | POA: Diagnosis not present

## 2021-02-11 MED ORDER — AZITHROMYCIN 250 MG PO TABS: ORAL_TABLET | ORAL | 0 refills | Status: DC

## 2021-02-12 ENCOUNTER — Telehealth: Payer: Self-pay | Admitting: Dietician

## 2021-02-12 DIAGNOSIS — I480 Paroxysmal atrial fibrillation: Secondary | ICD-10-CM | POA: Diagnosis not present

## 2021-02-12 DIAGNOSIS — I2510 Atherosclerotic heart disease of native coronary artery without angina pectoris: Secondary | ICD-10-CM | POA: Diagnosis not present

## 2021-02-12 DIAGNOSIS — I5030 Unspecified diastolic (congestive) heart failure: Secondary | ICD-10-CM | POA: Diagnosis not present

## 2021-02-12 DIAGNOSIS — E7800 Pure hypercholesterolemia, unspecified: Secondary | ICD-10-CM | POA: Diagnosis not present

## 2021-02-12 DIAGNOSIS — I110 Hypertensive heart disease with heart failure: Secondary | ICD-10-CM | POA: Diagnosis not present

## 2021-02-12 DIAGNOSIS — F32A Depression, unspecified: Secondary | ICD-10-CM | POA: Diagnosis not present

## 2021-02-12 DIAGNOSIS — M1990 Unspecified osteoarthritis, unspecified site: Secondary | ICD-10-CM | POA: Diagnosis not present

## 2021-02-12 DIAGNOSIS — Z9181 History of falling: Secondary | ICD-10-CM | POA: Diagnosis not present

## 2021-02-12 DIAGNOSIS — G9341 Metabolic encephalopathy: Secondary | ICD-10-CM | POA: Diagnosis not present

## 2021-02-12 DIAGNOSIS — K219 Gastro-esophageal reflux disease without esophagitis: Secondary | ICD-10-CM | POA: Diagnosis not present

## 2021-02-12 DIAGNOSIS — E876 Hypokalemia: Secondary | ICD-10-CM | POA: Diagnosis not present

## 2021-02-12 DIAGNOSIS — I222 Subsequent non-ST elevation (NSTEMI) myocardial infarction: Secondary | ICD-10-CM | POA: Diagnosis not present

## 2021-02-12 DIAGNOSIS — Z85118 Personal history of other malignant neoplasm of bronchus and lung: Secondary | ICD-10-CM | POA: Diagnosis not present

## 2021-02-12 DIAGNOSIS — D649 Anemia, unspecified: Secondary | ICD-10-CM | POA: Diagnosis not present

## 2021-02-12 DIAGNOSIS — L89150 Pressure ulcer of sacral region, unstageable: Secondary | ICD-10-CM | POA: Diagnosis not present

## 2021-02-12 DIAGNOSIS — J439 Emphysema, unspecified: Secondary | ICD-10-CM | POA: Diagnosis not present

## 2021-02-12 DIAGNOSIS — Z8673 Personal history of transient ischemic attack (TIA), and cerebral infarction without residual deficits: Secondary | ICD-10-CM | POA: Diagnosis not present

## 2021-02-12 DIAGNOSIS — Z87891 Personal history of nicotine dependence: Secondary | ICD-10-CM | POA: Diagnosis not present

## 2021-02-12 DIAGNOSIS — K5790 Diverticulosis of intestine, part unspecified, without perforation or abscess without bleeding: Secondary | ICD-10-CM | POA: Diagnosis not present

## 2021-02-12 DIAGNOSIS — I700 Atherosclerosis of aorta: Secondary | ICD-10-CM | POA: Diagnosis not present

## 2021-02-12 DIAGNOSIS — Z452 Encounter for adjustment and management of vascular access device: Secondary | ICD-10-CM | POA: Diagnosis not present

## 2021-02-12 DIAGNOSIS — Z7982 Long term (current) use of aspirin: Secondary | ICD-10-CM | POA: Diagnosis not present

## 2021-02-12 NOTE — Telephone Encounter (Signed)
Nutrition Assessment   Reason for Assessment: Called patient at her home in response to MST screen for eating poorly and weight loss.   ASSESSMENT:  Patient is a 81 year old female with Stage IIIB (T1b N3 M0) squamous cell lung cancer, diagnosed in June 2022.  She received concurrent chemoradiation and completed 6 cycles of carboplatin/Abraxane.   She completed radiation therapy on September 6th. She started maintenance immunotherapy with durvalumab for 1 year on October 11th and only received 2 cycles. Her future appointments include follow up with Dr. Hinton Rao  02/28/21 after CT chest  She reports her appetite same. She said states she never ate a whole lot. She does admit her energy level is very low. Feels to tired to do much including any food prep. Daughter lives with her and is doing most of the cooking right now.  Eats spaghetti, vegetable plates with cabbage, beans, and macaroni. Only eats a little a bit of breakfast. Spouse cooks sausage and eggs. Lunch is usually a sandwich or crackers.  Lunch is often light. Drinks a lot of juices, coffee, tea, doesn't drink much water. Never drinks milks. Snacks on oatmeal cookies, cheese and crackers, trail mix. She likes cottage cheese, doesn't like yogurts, hasn't tried and ONS.  Anthropometrics: Significant weight loss 16#, 11% past 90 days  02/07/21 129# 11/22/20 145#  Height: 59" Weight: 129.6# UBW: 145# BMI: 26.18   INTERVENTION: Reviewed strategies for increasing PO intake with healthy choices. Encouraged small frequent feeds with food requiring minimal prep. Emailed nutrition tips for healthy snacking. Provided contact information in email. She had not other questions at this time.  Next Visit: At patient request  April Manson, RDN, LDN Registered Dietitian, [place] Part Time Remote (Usual office hours: Tuesday-Thursday) Cell: [phone]

## 2021-02-13 DIAGNOSIS — G9341 Metabolic encephalopathy: Secondary | ICD-10-CM | POA: Diagnosis not present

## 2021-02-13 DIAGNOSIS — E7800 Pure hypercholesterolemia, unspecified: Secondary | ICD-10-CM | POA: Diagnosis not present

## 2021-02-13 DIAGNOSIS — I2510 Atherosclerotic heart disease of native coronary artery without angina pectoris: Secondary | ICD-10-CM | POA: Diagnosis not present

## 2021-02-13 DIAGNOSIS — M1990 Unspecified osteoarthritis, unspecified site: Secondary | ICD-10-CM | POA: Diagnosis not present

## 2021-02-13 DIAGNOSIS — Z7982 Long term (current) use of aspirin: Secondary | ICD-10-CM | POA: Diagnosis not present

## 2021-02-13 DIAGNOSIS — Z85118 Personal history of other malignant neoplasm of bronchus and lung: Secondary | ICD-10-CM | POA: Diagnosis not present

## 2021-02-13 DIAGNOSIS — I222 Subsequent non-ST elevation (NSTEMI) myocardial infarction: Secondary | ICD-10-CM | POA: Diagnosis not present

## 2021-02-13 DIAGNOSIS — D649 Anemia, unspecified: Secondary | ICD-10-CM | POA: Diagnosis not present

## 2021-02-13 DIAGNOSIS — E876 Hypokalemia: Secondary | ICD-10-CM | POA: Diagnosis not present

## 2021-02-13 DIAGNOSIS — K219 Gastro-esophageal reflux disease without esophagitis: Secondary | ICD-10-CM | POA: Diagnosis not present

## 2021-02-13 DIAGNOSIS — L89150 Pressure ulcer of sacral region, unstageable: Secondary | ICD-10-CM | POA: Diagnosis not present

## 2021-02-13 DIAGNOSIS — Z9181 History of falling: Secondary | ICD-10-CM | POA: Diagnosis not present

## 2021-02-13 DIAGNOSIS — I110 Hypertensive heart disease with heart failure: Secondary | ICD-10-CM | POA: Diagnosis not present

## 2021-02-13 DIAGNOSIS — J439 Emphysema, unspecified: Secondary | ICD-10-CM | POA: Diagnosis not present

## 2021-02-13 DIAGNOSIS — I480 Paroxysmal atrial fibrillation: Secondary | ICD-10-CM | POA: Diagnosis not present

## 2021-02-13 DIAGNOSIS — F32A Depression, unspecified: Secondary | ICD-10-CM | POA: Diagnosis not present

## 2021-02-13 DIAGNOSIS — K5790 Diverticulosis of intestine, part unspecified, without perforation or abscess without bleeding: Secondary | ICD-10-CM | POA: Diagnosis not present

## 2021-02-13 DIAGNOSIS — I700 Atherosclerosis of aorta: Secondary | ICD-10-CM | POA: Diagnosis not present

## 2021-02-13 DIAGNOSIS — Z452 Encounter for adjustment and management of vascular access device: Secondary | ICD-10-CM | POA: Diagnosis not present

## 2021-02-13 DIAGNOSIS — I5030 Unspecified diastolic (congestive) heart failure: Secondary | ICD-10-CM | POA: Diagnosis not present

## 2021-02-13 DIAGNOSIS — Z8673 Personal history of transient ischemic attack (TIA), and cerebral infarction without residual deficits: Secondary | ICD-10-CM | POA: Diagnosis not present

## 2021-02-13 DIAGNOSIS — Z87891 Personal history of nicotine dependence: Secondary | ICD-10-CM | POA: Diagnosis not present

## 2021-02-15 ENCOUNTER — Encounter: Payer: Self-pay | Admitting: Oncology

## 2021-02-15 DIAGNOSIS — I700 Atherosclerosis of aorta: Secondary | ICD-10-CM | POA: Diagnosis not present

## 2021-02-15 DIAGNOSIS — Z87891 Personal history of nicotine dependence: Secondary | ICD-10-CM | POA: Diagnosis not present

## 2021-02-15 DIAGNOSIS — E876 Hypokalemia: Secondary | ICD-10-CM | POA: Diagnosis not present

## 2021-02-15 DIAGNOSIS — G9341 Metabolic encephalopathy: Secondary | ICD-10-CM | POA: Diagnosis not present

## 2021-02-15 DIAGNOSIS — Z9181 History of falling: Secondary | ICD-10-CM | POA: Diagnosis not present

## 2021-02-15 DIAGNOSIS — Z85118 Personal history of other malignant neoplasm of bronchus and lung: Secondary | ICD-10-CM | POA: Diagnosis not present

## 2021-02-15 DIAGNOSIS — L89150 Pressure ulcer of sacral region, unstageable: Secondary | ICD-10-CM | POA: Diagnosis not present

## 2021-02-15 DIAGNOSIS — Z452 Encounter for adjustment and management of vascular access device: Secondary | ICD-10-CM | POA: Diagnosis not present

## 2021-02-15 DIAGNOSIS — Z7982 Long term (current) use of aspirin: Secondary | ICD-10-CM | POA: Diagnosis not present

## 2021-02-15 DIAGNOSIS — I5030 Unspecified diastolic (congestive) heart failure: Secondary | ICD-10-CM | POA: Diagnosis not present

## 2021-02-15 DIAGNOSIS — F32A Depression, unspecified: Secondary | ICD-10-CM | POA: Diagnosis not present

## 2021-02-15 DIAGNOSIS — J439 Emphysema, unspecified: Secondary | ICD-10-CM | POA: Diagnosis not present

## 2021-02-15 DIAGNOSIS — E7800 Pure hypercholesterolemia, unspecified: Secondary | ICD-10-CM | POA: Diagnosis not present

## 2021-02-15 DIAGNOSIS — K219 Gastro-esophageal reflux disease without esophagitis: Secondary | ICD-10-CM | POA: Diagnosis not present

## 2021-02-15 DIAGNOSIS — I110 Hypertensive heart disease with heart failure: Secondary | ICD-10-CM | POA: Diagnosis not present

## 2021-02-15 DIAGNOSIS — K5790 Diverticulosis of intestine, part unspecified, without perforation or abscess without bleeding: Secondary | ICD-10-CM | POA: Diagnosis not present

## 2021-02-15 DIAGNOSIS — I222 Subsequent non-ST elevation (NSTEMI) myocardial infarction: Secondary | ICD-10-CM | POA: Diagnosis not present

## 2021-02-15 DIAGNOSIS — D649 Anemia, unspecified: Secondary | ICD-10-CM | POA: Diagnosis not present

## 2021-02-15 DIAGNOSIS — I2510 Atherosclerotic heart disease of native coronary artery without angina pectoris: Secondary | ICD-10-CM | POA: Diagnosis not present

## 2021-02-15 DIAGNOSIS — Z8673 Personal history of transient ischemic attack (TIA), and cerebral infarction without residual deficits: Secondary | ICD-10-CM | POA: Diagnosis not present

## 2021-02-15 DIAGNOSIS — M1990 Unspecified osteoarthritis, unspecified site: Secondary | ICD-10-CM | POA: Diagnosis not present

## 2021-02-15 DIAGNOSIS — I480 Paroxysmal atrial fibrillation: Secondary | ICD-10-CM | POA: Diagnosis not present

## 2021-02-16 DIAGNOSIS — J9601 Acute respiratory failure with hypoxia: Secondary | ICD-10-CM | POA: Diagnosis not present

## 2021-02-18 ENCOUNTER — Encounter: Payer: Self-pay | Admitting: Oncology

## 2021-02-18 ENCOUNTER — Encounter: Payer: Self-pay | Admitting: Hematology and Oncology

## 2021-02-18 DIAGNOSIS — F32A Depression, unspecified: Secondary | ICD-10-CM | POA: Diagnosis not present

## 2021-02-18 DIAGNOSIS — E876 Hypokalemia: Secondary | ICD-10-CM | POA: Diagnosis not present

## 2021-02-18 DIAGNOSIS — Z452 Encounter for adjustment and management of vascular access device: Secondary | ICD-10-CM | POA: Diagnosis not present

## 2021-02-18 DIAGNOSIS — E7800 Pure hypercholesterolemia, unspecified: Secondary | ICD-10-CM | POA: Diagnosis not present

## 2021-02-18 DIAGNOSIS — L89150 Pressure ulcer of sacral region, unstageable: Secondary | ICD-10-CM | POA: Diagnosis not present

## 2021-02-18 DIAGNOSIS — I700 Atherosclerosis of aorta: Secondary | ICD-10-CM | POA: Diagnosis not present

## 2021-02-18 DIAGNOSIS — Z85118 Personal history of other malignant neoplasm of bronchus and lung: Secondary | ICD-10-CM | POA: Diagnosis not present

## 2021-02-18 DIAGNOSIS — G9341 Metabolic encephalopathy: Secondary | ICD-10-CM | POA: Diagnosis not present

## 2021-02-18 DIAGNOSIS — I2510 Atherosclerotic heart disease of native coronary artery without angina pectoris: Secondary | ICD-10-CM | POA: Diagnosis not present

## 2021-02-18 DIAGNOSIS — J439 Emphysema, unspecified: Secondary | ICD-10-CM | POA: Diagnosis not present

## 2021-02-18 DIAGNOSIS — K219 Gastro-esophageal reflux disease without esophagitis: Secondary | ICD-10-CM | POA: Diagnosis not present

## 2021-02-18 DIAGNOSIS — I5030 Unspecified diastolic (congestive) heart failure: Secondary | ICD-10-CM | POA: Diagnosis not present

## 2021-02-18 DIAGNOSIS — M1990 Unspecified osteoarthritis, unspecified site: Secondary | ICD-10-CM | POA: Diagnosis not present

## 2021-02-18 DIAGNOSIS — D649 Anemia, unspecified: Secondary | ICD-10-CM | POA: Diagnosis not present

## 2021-02-18 DIAGNOSIS — I110 Hypertensive heart disease with heart failure: Secondary | ICD-10-CM | POA: Diagnosis not present

## 2021-02-18 DIAGNOSIS — Z9181 History of falling: Secondary | ICD-10-CM | POA: Diagnosis not present

## 2021-02-18 DIAGNOSIS — Z7982 Long term (current) use of aspirin: Secondary | ICD-10-CM | POA: Diagnosis not present

## 2021-02-18 DIAGNOSIS — Z87891 Personal history of nicotine dependence: Secondary | ICD-10-CM | POA: Diagnosis not present

## 2021-02-18 DIAGNOSIS — Z8673 Personal history of transient ischemic attack (TIA), and cerebral infarction without residual deficits: Secondary | ICD-10-CM | POA: Diagnosis not present

## 2021-02-18 DIAGNOSIS — I480 Paroxysmal atrial fibrillation: Secondary | ICD-10-CM | POA: Diagnosis not present

## 2021-02-18 DIAGNOSIS — K5790 Diverticulosis of intestine, part unspecified, without perforation or abscess without bleeding: Secondary | ICD-10-CM | POA: Diagnosis not present

## 2021-02-18 DIAGNOSIS — I222 Subsequent non-ST elevation (NSTEMI) myocardial infarction: Secondary | ICD-10-CM | POA: Diagnosis not present

## 2021-02-20 DIAGNOSIS — K219 Gastro-esophageal reflux disease without esophagitis: Secondary | ICD-10-CM | POA: Diagnosis not present

## 2021-02-20 DIAGNOSIS — L89150 Pressure ulcer of sacral region, unstageable: Secondary | ICD-10-CM | POA: Diagnosis not present

## 2021-02-20 DIAGNOSIS — F32A Depression, unspecified: Secondary | ICD-10-CM | POA: Diagnosis not present

## 2021-02-20 DIAGNOSIS — E7800 Pure hypercholesterolemia, unspecified: Secondary | ICD-10-CM | POA: Diagnosis not present

## 2021-02-20 DIAGNOSIS — Z9181 History of falling: Secondary | ICD-10-CM | POA: Diagnosis not present

## 2021-02-20 DIAGNOSIS — I480 Paroxysmal atrial fibrillation: Secondary | ICD-10-CM | POA: Diagnosis not present

## 2021-02-20 DIAGNOSIS — Z85118 Personal history of other malignant neoplasm of bronchus and lung: Secondary | ICD-10-CM | POA: Diagnosis not present

## 2021-02-20 DIAGNOSIS — I252 Old myocardial infarction: Secondary | ICD-10-CM | POA: Diagnosis not present

## 2021-02-20 DIAGNOSIS — J9611 Chronic respiratory failure with hypoxia: Secondary | ICD-10-CM | POA: Diagnosis not present

## 2021-02-20 DIAGNOSIS — J439 Emphysema, unspecified: Secondary | ICD-10-CM | POA: Diagnosis not present

## 2021-02-20 DIAGNOSIS — D649 Anemia, unspecified: Secondary | ICD-10-CM | POA: Diagnosis not present

## 2021-02-20 DIAGNOSIS — M1990 Unspecified osteoarthritis, unspecified site: Secondary | ICD-10-CM | POA: Diagnosis not present

## 2021-02-20 DIAGNOSIS — I2510 Atherosclerotic heart disease of native coronary artery without angina pectoris: Secondary | ICD-10-CM | POA: Diagnosis not present

## 2021-02-20 DIAGNOSIS — I700 Atherosclerosis of aorta: Secondary | ICD-10-CM | POA: Diagnosis not present

## 2021-02-20 DIAGNOSIS — K5790 Diverticulosis of intestine, part unspecified, without perforation or abscess without bleeding: Secondary | ICD-10-CM | POA: Diagnosis not present

## 2021-02-20 DIAGNOSIS — Z87891 Personal history of nicotine dependence: Secondary | ICD-10-CM | POA: Diagnosis not present

## 2021-02-20 DIAGNOSIS — I5030 Unspecified diastolic (congestive) heart failure: Secondary | ICD-10-CM | POA: Diagnosis not present

## 2021-02-20 DIAGNOSIS — Z7982 Long term (current) use of aspirin: Secondary | ICD-10-CM | POA: Diagnosis not present

## 2021-02-20 DIAGNOSIS — L89153 Pressure ulcer of sacral region, stage 3: Secondary | ICD-10-CM | POA: Diagnosis not present

## 2021-02-20 DIAGNOSIS — J90 Pleural effusion, not elsewhere classified: Secondary | ICD-10-CM | POA: Diagnosis not present

## 2021-02-20 DIAGNOSIS — J701 Chronic and other pulmonary manifestations due to radiation: Secondary | ICD-10-CM | POA: Diagnosis not present

## 2021-02-20 DIAGNOSIS — Z8673 Personal history of transient ischemic attack (TIA), and cerebral infarction without residual deficits: Secondary | ICD-10-CM | POA: Diagnosis not present

## 2021-02-20 DIAGNOSIS — Z951 Presence of aortocoronary bypass graft: Secondary | ICD-10-CM | POA: Diagnosis not present

## 2021-02-20 DIAGNOSIS — I110 Hypertensive heart disease with heart failure: Secondary | ICD-10-CM | POA: Diagnosis not present

## 2021-02-22 NOTE — Progress Notes (Signed)
 NO SHOW

## 2021-02-26 DIAGNOSIS — Z85110 Personal history of malignant carcinoid tumor of bronchus and lung: Secondary | ICD-10-CM | POA: Diagnosis not present

## 2021-02-26 DIAGNOSIS — J90 Pleural effusion, not elsewhere classified: Secondary | ICD-10-CM | POA: Diagnosis not present

## 2021-02-26 DIAGNOSIS — I517 Cardiomegaly: Secondary | ICD-10-CM | POA: Diagnosis not present

## 2021-02-26 DIAGNOSIS — I700 Atherosclerosis of aorta: Secondary | ICD-10-CM | POA: Diagnosis not present

## 2021-02-26 DIAGNOSIS — C3431 Malignant neoplasm of lower lobe, right bronchus or lung: Secondary | ICD-10-CM | POA: Diagnosis not present

## 2021-02-26 DIAGNOSIS — R0789 Other chest pain: Secondary | ICD-10-CM | POA: Diagnosis not present

## 2021-02-26 DIAGNOSIS — J432 Centrilobular emphysema: Secondary | ICD-10-CM | POA: Diagnosis not present

## 2021-02-26 DIAGNOSIS — I2510 Atherosclerotic heart disease of native coronary artery without angina pectoris: Secondary | ICD-10-CM | POA: Diagnosis not present

## 2021-02-26 DIAGNOSIS — J439 Emphysema, unspecified: Secondary | ICD-10-CM | POA: Diagnosis not present

## 2021-02-26 LAB — BASIC METABOLIC PANEL
BUN: 15 (ref 4–21)
CO2: 25 — AB (ref 13–22)
Chloride: 109 — AB (ref 99–108)
Creatinine: 0.7 (ref 0.5–1.1)
Glucose: 107
Potassium: 4 (ref 3.4–5.3)
Sodium: 141 (ref 137–147)

## 2021-02-26 LAB — CEA: CEA: 7.1

## 2021-02-26 LAB — COMPREHENSIVE METABOLIC PANEL
Albumin: 3.3 — AB (ref 3.5–5.0)
Calcium: 9.4 (ref 8.7–10.7)

## 2021-02-26 LAB — CBC AND DIFFERENTIAL
HCT: 31 — AB (ref 36–46)
Hemoglobin: 10.3 — AB (ref 12.0–16.0)
Neutrophils Absolute: 5.26
Platelets: 223 (ref 150–399)
WBC: 7.3

## 2021-02-26 LAB — HEPATIC FUNCTION PANEL
ALT: 18 (ref 7–35)
AST: 26 (ref 13–35)
Alkaline Phosphatase: 86 (ref 25–125)
Bilirubin, Total: 0.9

## 2021-02-26 LAB — CBC: RBC: 3.45 — AB (ref 3.87–5.11)

## 2021-02-27 ENCOUNTER — Telehealth: Payer: Self-pay

## 2021-02-27 DIAGNOSIS — L89153 Pressure ulcer of sacral region, stage 3: Secondary | ICD-10-CM | POA: Diagnosis not present

## 2021-02-27 DIAGNOSIS — L89150 Pressure ulcer of sacral region, unstageable: Secondary | ICD-10-CM | POA: Diagnosis not present

## 2021-02-27 DIAGNOSIS — J9611 Chronic respiratory failure with hypoxia: Secondary | ICD-10-CM | POA: Diagnosis not present

## 2021-02-27 DIAGNOSIS — I110 Hypertensive heart disease with heart failure: Secondary | ICD-10-CM | POA: Diagnosis not present

## 2021-02-27 DIAGNOSIS — K5790 Diverticulosis of intestine, part unspecified, without perforation or abscess without bleeding: Secondary | ICD-10-CM | POA: Diagnosis not present

## 2021-02-27 DIAGNOSIS — J439 Emphysema, unspecified: Secondary | ICD-10-CM | POA: Diagnosis not present

## 2021-02-27 DIAGNOSIS — F32A Depression, unspecified: Secondary | ICD-10-CM | POA: Diagnosis not present

## 2021-02-27 DIAGNOSIS — E7800 Pure hypercholesterolemia, unspecified: Secondary | ICD-10-CM | POA: Diagnosis not present

## 2021-02-27 DIAGNOSIS — Z9181 History of falling: Secondary | ICD-10-CM | POA: Diagnosis not present

## 2021-02-27 DIAGNOSIS — Z951 Presence of aortocoronary bypass graft: Secondary | ICD-10-CM | POA: Diagnosis not present

## 2021-02-27 DIAGNOSIS — I252 Old myocardial infarction: Secondary | ICD-10-CM | POA: Diagnosis not present

## 2021-02-27 DIAGNOSIS — M1990 Unspecified osteoarthritis, unspecified site: Secondary | ICD-10-CM | POA: Diagnosis not present

## 2021-02-27 DIAGNOSIS — D649 Anemia, unspecified: Secondary | ICD-10-CM | POA: Diagnosis not present

## 2021-02-27 DIAGNOSIS — J701 Chronic and other pulmonary manifestations due to radiation: Secondary | ICD-10-CM | POA: Diagnosis not present

## 2021-02-27 DIAGNOSIS — I480 Paroxysmal atrial fibrillation: Secondary | ICD-10-CM | POA: Diagnosis not present

## 2021-02-27 DIAGNOSIS — K219 Gastro-esophageal reflux disease without esophagitis: Secondary | ICD-10-CM | POA: Diagnosis not present

## 2021-02-27 DIAGNOSIS — I700 Atherosclerosis of aorta: Secondary | ICD-10-CM | POA: Diagnosis not present

## 2021-02-27 DIAGNOSIS — I2510 Atherosclerotic heart disease of native coronary artery without angina pectoris: Secondary | ICD-10-CM | POA: Diagnosis not present

## 2021-02-27 DIAGNOSIS — Z8673 Personal history of transient ischemic attack (TIA), and cerebral infarction without residual deficits: Secondary | ICD-10-CM | POA: Diagnosis not present

## 2021-02-27 DIAGNOSIS — Z7982 Long term (current) use of aspirin: Secondary | ICD-10-CM | POA: Diagnosis not present

## 2021-02-27 DIAGNOSIS — I5030 Unspecified diastolic (congestive) heart failure: Secondary | ICD-10-CM | POA: Diagnosis not present

## 2021-02-27 DIAGNOSIS — Z85118 Personal history of other malignant neoplasm of bronchus and lung: Secondary | ICD-10-CM | POA: Diagnosis not present

## 2021-02-27 DIAGNOSIS — Z87891 Personal history of nicotine dependence: Secondary | ICD-10-CM | POA: Diagnosis not present

## 2021-02-27 DIAGNOSIS — J90 Pleural effusion, not elsewhere classified: Secondary | ICD-10-CM | POA: Diagnosis not present

## 2021-02-27 NOTE — Telephone Encounter (Addendum)
I spoke to pt's daughter. She gave her mom the in home COVID test, and it was negative. Her temp 98.1 @ present. The home health nurse was actually in the home when I called.  The nurse, Estill Bamberg, reports pt has dry cough and some wheezing in upper lobes. She did not have the wheezing last week. I told the daughter and home health nurse that pt is to see Dr Alcide Clever asap. I told them I sent fax over to his office earlier today. They will call in the morning, if Dr Chodri's office hasn't called them first. She will call PCP or see Urgent Care for acute symptoms and resp testing. Her appt for Dr Hinton Rao will be switched to telephone visit (Damaris notified to change appt type).   RE: Feeling bad last 3-4 days. Increased SOB Received: Today Derwood Kaplan, MD  Dairl Ponder, RN Her CT is very abnormal but not likely due to cancer or treatment.  It appears to be infection and/or fibrosis.   She needs to see Dr. Alcide Clever ASAP, he saw in hospital, and send him the CT report & labs    ----- Message from Derwood Kaplan, MD sent at 02/27/2021 10:56 AM EST ----- Regarding: RE: Feeling bad last 3-4 days. Increased SOB She needs to see her PCP or Urgent Care to deal with acute illness.  I can do tomorrow's visit by phone, will try to look for test results when I have a chance.  ----- Message ----- From: Dairl Ponder, RN Sent: 02/27/2021  10:22 AM EST To: Derwood Kaplan, MD Subject: Feeling bad last 3-4 days. Increased SOB       Pt's dtr, Chinera, called to report her mom is not doing well. She states she is having more trouble breathing and is having to use oxygen. She said her moms temp was 96.5 last night using head thermometer.  I spoke with Mrs Lumb. She states, "I haven't felt good for 3-4 days. I cant lie down to sleep". Pt admits to SOB @ rest, so is using her O2. She has dry cough mostly, alternating Dayquil and Nyquil. She denies N/V, sore throat, chills, and diarrhea. She said she  is eating a little and drinking a little. Her urine is yellow. Pt's daughter states the in home nurse is coming out to see her this afternoon. Pt's daughter is going to give her mom an in home COVID test, and check her temp orally. So when I call back she will have those answers for me. Pt will not go to emergency room. Pt had her labs and CT yesterday. She is scheduled to see you tomorrow in clinic. Do we need to make it a phone visit?

## 2021-02-28 ENCOUNTER — Inpatient Hospital Stay: Payer: Medicare Other | Attending: Internal Medicine | Admitting: Oncology

## 2021-02-28 DIAGNOSIS — J701 Chronic and other pulmonary manifestations due to radiation: Secondary | ICD-10-CM | POA: Diagnosis not present

## 2021-02-28 DIAGNOSIS — Z8673 Personal history of transient ischemic attack (TIA), and cerebral infarction without residual deficits: Secondary | ICD-10-CM | POA: Diagnosis not present

## 2021-02-28 DIAGNOSIS — I5033 Acute on chronic diastolic (congestive) heart failure: Secondary | ICD-10-CM | POA: Diagnosis not present

## 2021-02-28 DIAGNOSIS — M1990 Unspecified osteoarthritis, unspecified site: Secondary | ICD-10-CM | POA: Diagnosis not present

## 2021-02-28 DIAGNOSIS — I2510 Atherosclerotic heart disease of native coronary artery without angina pectoris: Secondary | ICD-10-CM | POA: Diagnosis not present

## 2021-02-28 DIAGNOSIS — I509 Heart failure, unspecified: Secondary | ICD-10-CM | POA: Diagnosis not present

## 2021-02-28 DIAGNOSIS — Z87440 Personal history of urinary (tract) infections: Secondary | ICD-10-CM | POA: Diagnosis not present

## 2021-02-28 DIAGNOSIS — I252 Old myocardial infarction: Secondary | ICD-10-CM | POA: Diagnosis not present

## 2021-02-28 DIAGNOSIS — I110 Hypertensive heart disease with heart failure: Secondary | ICD-10-CM | POA: Diagnosis not present

## 2021-02-28 DIAGNOSIS — I517 Cardiomegaly: Secondary | ICD-10-CM | POA: Diagnosis not present

## 2021-02-28 DIAGNOSIS — C3490 Malignant neoplasm of unspecified part of unspecified bronchus or lung: Secondary | ICD-10-CM | POA: Diagnosis not present

## 2021-02-28 DIAGNOSIS — J441 Chronic obstructive pulmonary disease with (acute) exacerbation: Secondary | ICD-10-CM | POA: Diagnosis not present

## 2021-02-28 DIAGNOSIS — E7800 Pure hypercholesterolemia, unspecified: Secondary | ICD-10-CM | POA: Diagnosis not present

## 2021-02-28 DIAGNOSIS — Z7982 Long term (current) use of aspirin: Secondary | ICD-10-CM | POA: Diagnosis not present

## 2021-02-28 DIAGNOSIS — I480 Paroxysmal atrial fibrillation: Secondary | ICD-10-CM | POA: Diagnosis not present

## 2021-02-28 DIAGNOSIS — R0600 Dyspnea, unspecified: Secondary | ICD-10-CM | POA: Diagnosis not present

## 2021-02-28 DIAGNOSIS — K219 Gastro-esophageal reflux disease without esophagitis: Secondary | ICD-10-CM | POA: Diagnosis not present

## 2021-02-28 DIAGNOSIS — W888XXS Exposure to other ionizing radiation, sequela: Secondary | ICD-10-CM | POA: Diagnosis not present

## 2021-02-28 DIAGNOSIS — J8410 Pulmonary fibrosis, unspecified: Secondary | ICD-10-CM | POA: Diagnosis not present

## 2021-02-28 DIAGNOSIS — J439 Emphysema, unspecified: Secondary | ICD-10-CM | POA: Diagnosis not present

## 2021-02-28 DIAGNOSIS — R0602 Shortness of breath: Secondary | ICD-10-CM | POA: Diagnosis not present

## 2021-02-28 DIAGNOSIS — J90 Pleural effusion, not elsewhere classified: Secondary | ICD-10-CM | POA: Diagnosis not present

## 2021-02-28 DIAGNOSIS — C3431 Malignant neoplasm of lower lobe, right bronchus or lung: Secondary | ICD-10-CM

## 2021-02-28 DIAGNOSIS — L89152 Pressure ulcer of sacral region, stage 2: Secondary | ICD-10-CM | POA: Diagnosis not present

## 2021-02-28 DIAGNOSIS — J9611 Chronic respiratory failure with hypoxia: Secondary | ICD-10-CM | POA: Diagnosis not present

## 2021-02-28 DIAGNOSIS — Z9981 Dependence on supplemental oxygen: Secondary | ICD-10-CM | POA: Diagnosis not present

## 2021-02-28 DIAGNOSIS — R059 Cough, unspecified: Secondary | ICD-10-CM | POA: Diagnosis not present

## 2021-02-28 DIAGNOSIS — Z792 Long term (current) use of antibiotics: Secondary | ICD-10-CM | POA: Diagnosis not present

## 2021-02-28 DIAGNOSIS — F32A Depression, unspecified: Secondary | ICD-10-CM | POA: Diagnosis not present

## 2021-02-28 DIAGNOSIS — R918 Other nonspecific abnormal finding of lung field: Secondary | ICD-10-CM | POA: Diagnosis not present

## 2021-02-28 DIAGNOSIS — R0689 Other abnormalities of breathing: Secondary | ICD-10-CM | POA: Diagnosis not present

## 2021-02-28 DIAGNOSIS — R0603 Acute respiratory distress: Secondary | ICD-10-CM | POA: Diagnosis not present

## 2021-02-28 DIAGNOSIS — Z882 Allergy status to sulfonamides status: Secondary | ICD-10-CM | POA: Diagnosis not present

## 2021-02-28 DIAGNOSIS — R0902 Hypoxemia: Secondary | ICD-10-CM | POA: Diagnosis not present

## 2021-02-28 DIAGNOSIS — Z888 Allergy status to other drugs, medicaments and biological substances status: Secondary | ICD-10-CM | POA: Diagnosis not present

## 2021-02-28 DIAGNOSIS — R000 Tachycardia, unspecified: Secondary | ICD-10-CM | POA: Diagnosis not present

## 2021-02-28 DIAGNOSIS — R062 Wheezing: Secondary | ICD-10-CM | POA: Diagnosis not present

## 2021-02-28 DIAGNOSIS — J9811 Atelectasis: Secondary | ICD-10-CM | POA: Diagnosis not present

## 2021-02-28 DIAGNOSIS — I4891 Unspecified atrial fibrillation: Secondary | ICD-10-CM | POA: Diagnosis not present

## 2021-02-28 DIAGNOSIS — Z79899 Other long term (current) drug therapy: Secondary | ICD-10-CM | POA: Diagnosis not present

## 2021-03-01 DIAGNOSIS — C3431 Malignant neoplasm of lower lobe, right bronchus or lung: Secondary | ICD-10-CM

## 2021-03-01 DIAGNOSIS — R000 Tachycardia, unspecified: Secondary | ICD-10-CM | POA: Diagnosis not present

## 2021-03-05 ENCOUNTER — Other Ambulatory Visit: Payer: Self-pay

## 2021-03-05 ENCOUNTER — Telehealth: Payer: Self-pay

## 2021-03-05 DIAGNOSIS — Z87891 Personal history of nicotine dependence: Secondary | ICD-10-CM | POA: Diagnosis not present

## 2021-03-05 DIAGNOSIS — I5030 Unspecified diastolic (congestive) heart failure: Secondary | ICD-10-CM | POA: Diagnosis not present

## 2021-03-05 DIAGNOSIS — J439 Emphysema, unspecified: Secondary | ICD-10-CM | POA: Diagnosis not present

## 2021-03-05 DIAGNOSIS — D649 Anemia, unspecified: Secondary | ICD-10-CM | POA: Diagnosis not present

## 2021-03-05 DIAGNOSIS — J701 Chronic and other pulmonary manifestations due to radiation: Secondary | ICD-10-CM | POA: Diagnosis not present

## 2021-03-05 DIAGNOSIS — E7800 Pure hypercholesterolemia, unspecified: Secondary | ICD-10-CM | POA: Diagnosis not present

## 2021-03-05 DIAGNOSIS — K5790 Diverticulosis of intestine, part unspecified, without perforation or abscess without bleeding: Secondary | ICD-10-CM | POA: Diagnosis not present

## 2021-03-05 DIAGNOSIS — I2510 Atherosclerotic heart disease of native coronary artery without angina pectoris: Secondary | ICD-10-CM | POA: Diagnosis not present

## 2021-03-05 DIAGNOSIS — Z7982 Long term (current) use of aspirin: Secondary | ICD-10-CM | POA: Diagnosis not present

## 2021-03-05 DIAGNOSIS — Z85118 Personal history of other malignant neoplasm of bronchus and lung: Secondary | ICD-10-CM | POA: Diagnosis not present

## 2021-03-05 DIAGNOSIS — M1990 Unspecified osteoarthritis, unspecified site: Secondary | ICD-10-CM | POA: Diagnosis not present

## 2021-03-05 DIAGNOSIS — J9611 Chronic respiratory failure with hypoxia: Secondary | ICD-10-CM | POA: Diagnosis not present

## 2021-03-05 DIAGNOSIS — K219 Gastro-esophageal reflux disease without esophagitis: Secondary | ICD-10-CM | POA: Diagnosis not present

## 2021-03-05 DIAGNOSIS — L89150 Pressure ulcer of sacral region, unstageable: Secondary | ICD-10-CM | POA: Diagnosis not present

## 2021-03-05 DIAGNOSIS — Z9181 History of falling: Secondary | ICD-10-CM | POA: Diagnosis not present

## 2021-03-05 DIAGNOSIS — L89153 Pressure ulcer of sacral region, stage 3: Secondary | ICD-10-CM | POA: Diagnosis not present

## 2021-03-05 DIAGNOSIS — F32A Depression, unspecified: Secondary | ICD-10-CM | POA: Diagnosis not present

## 2021-03-05 DIAGNOSIS — I252 Old myocardial infarction: Secondary | ICD-10-CM | POA: Diagnosis not present

## 2021-03-05 DIAGNOSIS — I110 Hypertensive heart disease with heart failure: Secondary | ICD-10-CM | POA: Diagnosis not present

## 2021-03-05 DIAGNOSIS — J90 Pleural effusion, not elsewhere classified: Secondary | ICD-10-CM | POA: Diagnosis not present

## 2021-03-05 DIAGNOSIS — C3431 Malignant neoplasm of lower lobe, right bronchus or lung: Secondary | ICD-10-CM

## 2021-03-05 DIAGNOSIS — Z8673 Personal history of transient ischemic attack (TIA), and cerebral infarction without residual deficits: Secondary | ICD-10-CM | POA: Diagnosis not present

## 2021-03-05 DIAGNOSIS — I480 Paroxysmal atrial fibrillation: Secondary | ICD-10-CM | POA: Diagnosis not present

## 2021-03-05 DIAGNOSIS — Z951 Presence of aortocoronary bypass graft: Secondary | ICD-10-CM | POA: Diagnosis not present

## 2021-03-05 DIAGNOSIS — I700 Atherosclerosis of aorta: Secondary | ICD-10-CM | POA: Diagnosis not present

## 2021-03-05 NOTE — Telephone Encounter (Signed)
Patients daughter states they do not have an appointment with the pulmonologist. Will need to send referral to [facility].

## 2021-03-05 NOTE — Telephone Encounter (Signed)
-----  Message from Derwood Kaplan, MD sent at 03/04/2021  2:04 PM EST ----- Regarding: referral When I met with the family Friday in the ER, we feel she needs to see [facility], previously saw Dr. Baltazar Apo. It is regarding the latest findings on CT scan, she has NOT had any chemotherapy in 6 months, and only had 2 doses of immunotherapy in Oct & Nov.,over 3 months ago.  Check with the family whether she was referred when discharged over the weekend, or do we need to do?  If not, please make referral regarding poss atypical infection vs pulm fibrosis.  Be sure to send her latest CT from last week, the disch sum from this admission, but also the disch sum from her prolonged admission in Dec.

## 2021-03-07 DIAGNOSIS — I110 Hypertensive heart disease with heart failure: Secondary | ICD-10-CM | POA: Diagnosis not present

## 2021-03-07 DIAGNOSIS — K219 Gastro-esophageal reflux disease without esophagitis: Secondary | ICD-10-CM | POA: Diagnosis not present

## 2021-03-07 DIAGNOSIS — I480 Paroxysmal atrial fibrillation: Secondary | ICD-10-CM | POA: Diagnosis not present

## 2021-03-07 DIAGNOSIS — F32A Depression, unspecified: Secondary | ICD-10-CM | POA: Diagnosis not present

## 2021-03-07 DIAGNOSIS — I5030 Unspecified diastolic (congestive) heart failure: Secondary | ICD-10-CM | POA: Diagnosis not present

## 2021-03-07 DIAGNOSIS — K5790 Diverticulosis of intestine, part unspecified, without perforation or abscess without bleeding: Secondary | ICD-10-CM | POA: Diagnosis not present

## 2021-03-07 DIAGNOSIS — Z9181 History of falling: Secondary | ICD-10-CM | POA: Diagnosis not present

## 2021-03-07 DIAGNOSIS — Z87891 Personal history of nicotine dependence: Secondary | ICD-10-CM | POA: Diagnosis not present

## 2021-03-07 DIAGNOSIS — Z85118 Personal history of other malignant neoplasm of bronchus and lung: Secondary | ICD-10-CM | POA: Diagnosis not present

## 2021-03-07 DIAGNOSIS — Z7982 Long term (current) use of aspirin: Secondary | ICD-10-CM | POA: Diagnosis not present

## 2021-03-07 DIAGNOSIS — L89150 Pressure ulcer of sacral region, unstageable: Secondary | ICD-10-CM | POA: Diagnosis not present

## 2021-03-07 DIAGNOSIS — J701 Chronic and other pulmonary manifestations due to radiation: Secondary | ICD-10-CM | POA: Diagnosis not present

## 2021-03-07 DIAGNOSIS — J90 Pleural effusion, not elsewhere classified: Secondary | ICD-10-CM | POA: Diagnosis not present

## 2021-03-07 DIAGNOSIS — L89153 Pressure ulcer of sacral region, stage 3: Secondary | ICD-10-CM | POA: Diagnosis not present

## 2021-03-07 DIAGNOSIS — E7800 Pure hypercholesterolemia, unspecified: Secondary | ICD-10-CM | POA: Diagnosis not present

## 2021-03-07 DIAGNOSIS — I252 Old myocardial infarction: Secondary | ICD-10-CM | POA: Diagnosis not present

## 2021-03-07 DIAGNOSIS — J9611 Chronic respiratory failure with hypoxia: Secondary | ICD-10-CM | POA: Diagnosis not present

## 2021-03-07 DIAGNOSIS — D649 Anemia, unspecified: Secondary | ICD-10-CM | POA: Diagnosis not present

## 2021-03-07 DIAGNOSIS — Z951 Presence of aortocoronary bypass graft: Secondary | ICD-10-CM | POA: Diagnosis not present

## 2021-03-07 DIAGNOSIS — I2510 Atherosclerotic heart disease of native coronary artery without angina pectoris: Secondary | ICD-10-CM | POA: Diagnosis not present

## 2021-03-07 DIAGNOSIS — I700 Atherosclerosis of aorta: Secondary | ICD-10-CM | POA: Diagnosis not present

## 2021-03-07 DIAGNOSIS — Z8673 Personal history of transient ischemic attack (TIA), and cerebral infarction without residual deficits: Secondary | ICD-10-CM | POA: Diagnosis not present

## 2021-03-07 DIAGNOSIS — J439 Emphysema, unspecified: Secondary | ICD-10-CM | POA: Diagnosis not present

## 2021-03-07 DIAGNOSIS — M1990 Unspecified osteoarthritis, unspecified site: Secondary | ICD-10-CM | POA: Diagnosis not present

## 2021-03-08 ENCOUNTER — Encounter: Payer: Self-pay | Admitting: Oncology

## 2021-03-11 DIAGNOSIS — I480 Paroxysmal atrial fibrillation: Secondary | ICD-10-CM | POA: Diagnosis not present

## 2021-03-11 DIAGNOSIS — L89150 Pressure ulcer of sacral region, unstageable: Secondary | ICD-10-CM | POA: Diagnosis not present

## 2021-03-11 DIAGNOSIS — I252 Old myocardial infarction: Secondary | ICD-10-CM | POA: Diagnosis not present

## 2021-03-11 DIAGNOSIS — F32A Depression, unspecified: Secondary | ICD-10-CM | POA: Diagnosis not present

## 2021-03-11 DIAGNOSIS — E7800 Pure hypercholesterolemia, unspecified: Secondary | ICD-10-CM | POA: Diagnosis not present

## 2021-03-11 DIAGNOSIS — Z951 Presence of aortocoronary bypass graft: Secondary | ICD-10-CM | POA: Diagnosis not present

## 2021-03-11 DIAGNOSIS — I5030 Unspecified diastolic (congestive) heart failure: Secondary | ICD-10-CM | POA: Diagnosis not present

## 2021-03-11 DIAGNOSIS — J90 Pleural effusion, not elsewhere classified: Secondary | ICD-10-CM | POA: Diagnosis not present

## 2021-03-11 DIAGNOSIS — L89153 Pressure ulcer of sacral region, stage 3: Secondary | ICD-10-CM | POA: Diagnosis not present

## 2021-03-11 DIAGNOSIS — J439 Emphysema, unspecified: Secondary | ICD-10-CM | POA: Diagnosis not present

## 2021-03-11 DIAGNOSIS — D649 Anemia, unspecified: Secondary | ICD-10-CM | POA: Diagnosis not present

## 2021-03-11 DIAGNOSIS — Z8673 Personal history of transient ischemic attack (TIA), and cerebral infarction without residual deficits: Secondary | ICD-10-CM | POA: Diagnosis not present

## 2021-03-11 DIAGNOSIS — K219 Gastro-esophageal reflux disease without esophagitis: Secondary | ICD-10-CM | POA: Diagnosis not present

## 2021-03-11 DIAGNOSIS — Z87891 Personal history of nicotine dependence: Secondary | ICD-10-CM | POA: Diagnosis not present

## 2021-03-11 DIAGNOSIS — J701 Chronic and other pulmonary manifestations due to radiation: Secondary | ICD-10-CM | POA: Diagnosis not present

## 2021-03-11 DIAGNOSIS — I110 Hypertensive heart disease with heart failure: Secondary | ICD-10-CM | POA: Diagnosis not present

## 2021-03-11 DIAGNOSIS — I2510 Atherosclerotic heart disease of native coronary artery without angina pectoris: Secondary | ICD-10-CM | POA: Diagnosis not present

## 2021-03-11 DIAGNOSIS — Z7982 Long term (current) use of aspirin: Secondary | ICD-10-CM | POA: Diagnosis not present

## 2021-03-11 DIAGNOSIS — Z9181 History of falling: Secondary | ICD-10-CM | POA: Diagnosis not present

## 2021-03-11 DIAGNOSIS — Z85118 Personal history of other malignant neoplasm of bronchus and lung: Secondary | ICD-10-CM | POA: Diagnosis not present

## 2021-03-11 DIAGNOSIS — J9611 Chronic respiratory failure with hypoxia: Secondary | ICD-10-CM | POA: Diagnosis not present

## 2021-03-11 DIAGNOSIS — M1990 Unspecified osteoarthritis, unspecified site: Secondary | ICD-10-CM | POA: Diagnosis not present

## 2021-03-11 DIAGNOSIS — K5790 Diverticulosis of intestine, part unspecified, without perforation or abscess without bleeding: Secondary | ICD-10-CM | POA: Diagnosis not present

## 2021-03-11 DIAGNOSIS — I700 Atherosclerosis of aorta: Secondary | ICD-10-CM | POA: Diagnosis not present

## 2021-03-12 DIAGNOSIS — C3491 Malignant neoplasm of unspecified part of right bronchus or lung: Secondary | ICD-10-CM | POA: Diagnosis not present

## 2021-03-12 DIAGNOSIS — J90 Pleural effusion, not elsewhere classified: Secondary | ICD-10-CM | POA: Diagnosis not present

## 2021-03-12 DIAGNOSIS — J449 Chronic obstructive pulmonary disease, unspecified: Secondary | ICD-10-CM | POA: Diagnosis not present

## 2021-03-12 DIAGNOSIS — D539 Nutritional anemia, unspecified: Secondary | ICD-10-CM | POA: Diagnosis not present

## 2021-03-12 DIAGNOSIS — I10 Essential (primary) hypertension: Secondary | ICD-10-CM | POA: Diagnosis not present

## 2021-03-12 DIAGNOSIS — R5381 Other malaise: Secondary | ICD-10-CM | POA: Diagnosis not present

## 2021-03-12 DIAGNOSIS — I480 Paroxysmal atrial fibrillation: Secondary | ICD-10-CM | POA: Diagnosis not present

## 2021-03-12 DIAGNOSIS — L89153 Pressure ulcer of sacral region, stage 3: Secondary | ICD-10-CM | POA: Diagnosis not present

## 2021-03-13 ENCOUNTER — Other Ambulatory Visit: Payer: Self-pay

## 2021-03-13 ENCOUNTER — Encounter: Payer: Self-pay | Admitting: Primary Care

## 2021-03-13 ENCOUNTER — Ambulatory Visit (INDEPENDENT_AMBULATORY_CARE_PROVIDER_SITE_OTHER): Payer: Medicare Other | Admitting: Primary Care

## 2021-03-13 VITALS — BP 122/68 | HR 94 | Ht 59.0 in | Wt 130.6 lb

## 2021-03-13 DIAGNOSIS — F17200 Nicotine dependence, unspecified, uncomplicated: Secondary | ICD-10-CM | POA: Diagnosis not present

## 2021-03-13 DIAGNOSIS — C3431 Malignant neoplasm of lower lobe, right bronchus or lung: Secondary | ICD-10-CM | POA: Diagnosis not present

## 2021-03-13 DIAGNOSIS — J9611 Chronic respiratory failure with hypoxia: Secondary | ICD-10-CM | POA: Diagnosis not present

## 2021-03-13 DIAGNOSIS — R918 Other nonspecific abnormal finding of lung field: Secondary | ICD-10-CM

## 2021-03-13 DIAGNOSIS — J449 Chronic obstructive pulmonary disease, unspecified: Secondary | ICD-10-CM | POA: Diagnosis not present

## 2021-03-13 MED ORDER — IPRATROPIUM-ALBUTEROL 0.5-2.5 (3) MG/3ML IN SOLN: 3.0000 mL | Freq: Four times a day (QID) | RESPIRATORY_TRACT | 0 refills | Status: DC | PRN

## 2021-03-13 NOTE — Progress Notes (Signed)
@Patient  ID: [name], female    DOB: Jun 08, 1940, 81 y.o.   MRN: [id number]  Chief Complaint  Patient presents with   Hospitalization Follow-up    PT WAS IN [place] LAST WEEK FOR SOB SHE WAS GIVEN A ROUND OF STEROIDS AND ANTIBIOTICS AND PATIENT STATES SHE IS NOW FEELING BETTER     Referring provider: Lowella Dandy, NP  HPI: 81 year old female, former smoker quit in June 2022 (60-pack-year history).  Past medical history significant for COPD, COVID 19 (August 2022), respiratory failure, malignant neoplasm right lower lobe, history tracheostomy, coronary artery disease, hypertension, NSTEMI, esophagus, diabetes.  Patient of Dr. Lamonte Sakai, last seen in office on 08/10/2020.  03/13/2021 Patient presents today for 6 month follow-up.   History of stage IIIb squamous cell lung cancer, diagnosed in June 2022.  Patient received concurrent chemoradiation and completed 6 cycles of carboplatin/abraxane.  Completed radiation September 2022.  CT imaging showed good response.  Her last dose of immunotherapy was on October 27.  CT imaging revealed worsening aeration throughout lungs bilaterally.  Previously noted right lower lobe neoplasm is now completely obscured by what scared by evolving postradiation fibrosis.  Increased moderate bilateral pleural effusions.  Admitted 02/28/21-03/03/21 for COPD exacerbations, radiation fibrosis and right upper lobe pulmonary infiltrate. She had a prolonged hospitalization back in November d/t pneumonia and septic shock requiring ventilation. Patient presents with worsening shortness of breath over 4 days. Imaging revealed no PE however it did note that patient had post radiation fibrosis and ground glass pulmonary infiltrate which differential includes atypical infection ves post radiation or drug reaction. Treated with IV steriods, neb treatments and antibiotics (cefepime, azithromycin and Augmentin)  She is feeling better, residual fatigue. She has  cough without significant mucus production. Denies wheezing or chest tightness. She has presumed COPD, she is not on any maintenance inhalers. She has a nebulizer machine at home, uses ipratropium- albuterol 3-4 times a day. No PFTs on file. She is not wearing her oxygen today. She uses 3L with exertion and at night. She is following with oncology, she is due to see them back in 1-2 weeks with repeat labs and CT imaging.    Oncology did not feel this was related to drug reaction because patient had nto had any chemotherapy or immunotherapy in months. They also feel non-small cell lung cancer is stable and does not appear to be ana active issue at this time. They recommend bronchoscopy to evaluate right lower lone infiltrate.   Allergies  Allergen Reactions   Pregabalin Swelling    Tongue swelling   Paclitaxel    Sulfonamide Derivatives Swelling    Childhood reaction    Immunization History  Administered Date(s) Administered   Influenza-Unspecified 05/10/2013, 10/21/2015   PFIZER(Purple Top)SARS-COV-2 Vaccination 03/03/2019, 03/28/2019, 10/21/2019   Pneumococcal Polysaccharide-23 10/20/2012, 12/21/2012   Tdap 01/23/2020    Past Medical History:  Diagnosis Date   Acute respiratory failure with hypoxia s/p tracheostomy 07/20/2013   Anemia    a. 7-08/2013 felt due to recent critical illness/chronic disease.   Arthritis    handds, knees & back    B12 deficiency 11/14/2020   Barrett's esophagus    CAD (coronary artery disease)    a. Mod dz 2010 initially mgd medically. b. 12/2012 - angina s/p PTCA/DES to mid-circumflex, PTCA/DES to first OM.    Cancer [facility])    Carotid artery disease ([place])    a. 60-70% bilat ICA stenosis by dopplers 05/2012.   Chronic back pain  deteriorating;DDD   Colonic obstruction due to suspected colitis; s/p colectomy/colostomy    a. See SBO.   Complication of anesthesia    hard to wake up from anesthesia    COPD (chronic obstructive pulmonary disease) (HCC)     Depression    takes Remeron and Zoloft daily   Dizziness    was on Bentyl which caused this-took off of it and no problems since   DVT of axillary vein, acute right ([place])    a. Dx 07/2013 felt due to R IJ central line that had been inserted 7/14. Not on anticoag due to GIB issues and small cerebral bleed.   GERD (gastroesophageal reflux disease)    Heart murmur    History of bronchitis    > 31yrs ago    History of colon polyps    History of hiatal hernia    History of kidney stones    was told it was stable but doesn't know for sure that she ever passed it   HTN (hypertension)    Hypertension    takes Metoprolol daily   Intracranial hemorrhage ([place])    a. 08/11/13 - per DC summary, tiny SAH vs SDH done for altered mentation, anticoag stopped including aspirin.   Joint pain    Neck pain    DDD   Other and unspecified hyperlipidemia    takes Lipitor daily   Pneumonia    hx of->15 yrs ago   Protein-calorie malnutrition, severe w/ electrolyte imbalance    a. Severe hypoalbuminemia leading to 3rd spacing including anasarca and pulm edema 07/2013.   SBO (small bowel obstruction) ([place])    a. Lysis of adhesions and ovarian cystectomy 04/2013 for sBO. b. Admitted 07/2013 for colonic obstruction due to suspected colitis, s/p partial colectomy/colostomy 08/01/13. Admission complicated by anasarca, acute resp failure requiring tracheostomy, decannulated 08/25/13. c. Recurrent SBO8/2015, NGT placed for decompression.    Stroke [facility]) early 80's   right sided weakness--TIA    Tobacco History: Social History   Tobacco Use  Smoking Status Former   Packs/day: 1.00   Years: 60.00   Pack years: 60.00   Types: Cigarettes   Quit date: 06/2020   Years since quitting: 0.7  Smokeless Tobacco Never  Tobacco Comments   Stopped smoking in May 2022. ARJ 08/10/20   Counseling given: Not Answered Tobacco comments: Stopped smoking in May 2022. ARJ 08/10/20   Outpatient Medications Prior to Visit   Medication Sig Dispense Refill   aspirin EC 81 MG tablet Take 2 tablets (162 mg total) by mouth in the morning. Okay to restart this medication on 07/04/2020 30 tablet 11   atorvastatin (LIPITOR) 80 MG tablet Take 80 mg by mouth every evening.      azithromycin (ZITHROMAX Z-PAK) 250 MG tablet 2 pills today, then 1 pill daily 6 each 0   buPROPion (WELLBUTRIN XL) 150 MG 24 hr tablet Take 1 tablet (150 mg total) by mouth daily. 90 tablet 3   fish oil-omega-3 fatty acids 1000 MG capsule Take 1,000 mg by mouth in the morning.     fluticasone (FLONASE) 50 MCG/ACT nasal spray Place 1 spray into both nostrils daily as needed for allergies.     folic acid (FOLVITE) 568 MCG tablet Take 800 mcg by mouth in the morning.     metoprolol succinate (TOPROL-XL) 25 MG 24 hr tablet Take by mouth.     mirtazapine (REMERON) 45 MG tablet Take 45 mg by mouth at bedtime.      nitroGLYCERIN (  NITROSTAT) 0.4 MG SL tablet DISSOLVE 1 TABLET UNDER THE TONGUE EVERY 5 MINUTES AS  NEEDED FOR CHEST PAIN. MAX  OF 3 TABLETS IN 15 MINUTES. CALL 911 IF PAIN PERSISTS. 100 tablet 3   ondansetron (ZOFRAN) 4 MG tablet Take 4 mg by mouth every 8 (eight) hours as needed for nausea or vomiting.     oxyCODONE (OXY IR/ROXICODONE) 5 MG immediate release tablet Take by mouth.     pantoprazole (PROTONIX) 40 MG tablet Take 40 mg by mouth in the morning.     potassium chloride SA (KLOR-CON M) 20 MEQ tablet Take 20 mEq by mouth daily.     sertraline (ZOLOFT) 100 MG tablet Take 200 mg by mouth in the morning.     zolpidem (AMBIEN) 5 MG tablet TAKE 1 TABLET BY MOUTH AT BEDTIME AS NEEDED FOR SLEEP 30 tablet 0   No facility-administered medications prior to visit.    Review of Systems  Review of Systems  Constitutional:  Positive for fatigue.  HENT: Negative.    Respiratory:  Positive for cough. Negative for chest tightness, shortness of breath and wheezing.   Cardiovascular: Negative.     Physical Exam  BP 122/68    Pulse 94    Ht 4\' 11"   (1.499 m)    Wt 130 lb 9.6 oz (59.2 kg)    SpO2 96%    BMI 26.38 kg/m  Physical Exam HENT:     Head: Normocephalic and atraumatic.     Mouth/Throat:     Mouth: Mucous membranes are moist.     Pharynx: Oropharynx is clear.  Cardiovascular:     Rate and Rhythm: Normal rate and regular rhythm.     Comments: No edema Pulmonary:     Effort: Pulmonary effort is normal.     Breath sounds: Normal breath sounds. No rhonchi.     Comments: Fine rales right lower base; O2 96% RA  Musculoskeletal:        General: Normal range of motion.     Cervical back: Normal range of motion and neck supple.  Skin:    General: Skin is warm and dry.  Neurological:     General: No focal deficit present.     Mental Status: She is alert and oriented to person, place, and time. Mental status is at baseline.  Psychiatric:        Mood and Affect: Mood normal.        Behavior: Behavior normal.        Thought Content: Thought content normal.        Judgment: Judgment normal.     Lab Results:  CBC    Component Value Date/Time   WBC 7.3 02/07/2021 0000   WBC 2.7 (L) 09/17/2020 1013   WBC 4.6 08/17/2020 0418   RBC 3.8 (A) 02/07/2021 0000   HGB 11.1 (A) 02/07/2021 0000   HGB 9.6 (L) 09/17/2020 1013   HGB 11.9 01/25/2019 1036   HCT 34 (A) 02/07/2021 0000   HCT 36.7 01/25/2019 1036   PLT 223 02/07/2021 0000   PLT 193 09/17/2020 1013   PLT 281 01/25/2019 1036   MCV 90 02/07/2021 0000   MCH 29.8 09/17/2020 1013   MCHC 32.9 09/17/2020 1013   RDW 20.3 (H) 09/17/2020 1013   RDW 13.5 01/25/2019 1036   LYMPHSABS 0.7 09/17/2020 1013   LYMPHSABS 2.7 01/30/2016 1052   MONOABS 0.3 09/17/2020 1013   EOSABS 0.0 09/17/2020 1013   EOSABS 0.1 01/30/2016 1052   BASOSABS  0.0 09/17/2020 1013   BASOSABS 0.0 01/30/2016 1052    BMET    Component Value Date/Time   NA 138 02/07/2021 0000   K 3.6 02/07/2021 0000   CL 106 02/07/2021 0000   CO2 23 (A) 02/07/2021 0000   GLUCOSE 166 (H) 09/17/2020 1013   BUN 21  02/07/2021 0000   CREATININE 0.8 02/07/2021 0000   CREATININE 0.77 09/17/2020 1013   CALCIUM 9.4 02/07/2021 0000   GFRNONAA >60 09/17/2020 1013   GFRAA 66 01/25/2019 1036    BNP    Component Value Date/Time   BNP 186.4 (H) 01/08/2019 2233    ProBNP    Component Value Date/Time   PROBNP 379.5 (H) 10/20/2013 1550    Imaging: No results found.   Assessment & Plan:   Pulmonary infiltrate - Patient was admitted on 02/28/21-03/03/21 at [facility] for COPD exacerbation, radiation fibrosis and RUL pulmonary infiltrate. Imaging revealed no PE however it did note that patient had post radiation fibrosis and ground glass pulmonary infiltrate. Treated with IV steriods, bronchodilators and antibiotics (cefemine, azithromycin and Augmentin). Oncology did not feel thios was related to drug reaction because patient had nto had any chemotherapy or immunotherapy in months. They also feel non-small cell lung cancer is stable and does not appear to be ana ctive issue at this time. They had recommended repeating CT imaging in 2-4 weeks, if not better will likely need bronchoscopy to evaluate right lobe infiltrate.    COPD (chronic obstructive pulmonary disease) ([place]) - Patient carries a dx COPD but has had no formal pulmonary function testing. She is not on scheduled bronchodilators. She has tried stiolto in the past. Recommend getting PFTs at follow-up. Advised she start using ipratropium-albuterol four times a day.   Chronic respiratory failure with hypoxia (HCC) - Continue supplemental oxygen 3L with exertion and at night to maintain O2 > 88-90%  Malignant neoplasm of bronchus of right lower lobe ([place]) - Advised she call oncologist about scheduling CT chest in 1-2 weeks to follow-up on post radiation fibrosis RLL and GGO infiltrate RUL   FU in 1 month with Dr. Lamonte Sakai  after CT imaging and PFTs  40 mins spent on case: > 50% face to face   Martyn Ehrich, NP 03/15/2021

## 2021-03-13 NOTE — Patient Instructions (Addendum)
Recommendations: Continue supplemental oxygen 3L with exertion and at night to maintain O2 > 88-90% Using ipratropium-albuterol four times a day (8am,12pm,4pm, 8pm) Call oncologist about follow-up CT imaging/labs in 1-2 weeks   Orders: Pulmonary function testing   Follow-up: 1 month with Dr. Lamonte Sakai - he has openings in March (1 hour PFT prior)

## 2021-03-14 DIAGNOSIS — I700 Atherosclerosis of aorta: Secondary | ICD-10-CM | POA: Diagnosis not present

## 2021-03-14 DIAGNOSIS — F32A Depression, unspecified: Secondary | ICD-10-CM | POA: Diagnosis not present

## 2021-03-14 DIAGNOSIS — I5030 Unspecified diastolic (congestive) heart failure: Secondary | ICD-10-CM | POA: Diagnosis not present

## 2021-03-14 DIAGNOSIS — Z85118 Personal history of other malignant neoplasm of bronchus and lung: Secondary | ICD-10-CM | POA: Diagnosis not present

## 2021-03-14 DIAGNOSIS — Z87891 Personal history of nicotine dependence: Secondary | ICD-10-CM | POA: Diagnosis not present

## 2021-03-14 DIAGNOSIS — Z7982 Long term (current) use of aspirin: Secondary | ICD-10-CM | POA: Diagnosis not present

## 2021-03-14 DIAGNOSIS — K219 Gastro-esophageal reflux disease without esophagitis: Secondary | ICD-10-CM | POA: Diagnosis not present

## 2021-03-14 DIAGNOSIS — I110 Hypertensive heart disease with heart failure: Secondary | ICD-10-CM | POA: Diagnosis not present

## 2021-03-14 DIAGNOSIS — I480 Paroxysmal atrial fibrillation: Secondary | ICD-10-CM | POA: Diagnosis not present

## 2021-03-14 DIAGNOSIS — E7800 Pure hypercholesterolemia, unspecified: Secondary | ICD-10-CM | POA: Diagnosis not present

## 2021-03-14 DIAGNOSIS — D649 Anemia, unspecified: Secondary | ICD-10-CM | POA: Diagnosis not present

## 2021-03-14 DIAGNOSIS — I252 Old myocardial infarction: Secondary | ICD-10-CM | POA: Diagnosis not present

## 2021-03-14 DIAGNOSIS — Z951 Presence of aortocoronary bypass graft: Secondary | ICD-10-CM | POA: Diagnosis not present

## 2021-03-14 DIAGNOSIS — J439 Emphysema, unspecified: Secondary | ICD-10-CM | POA: Diagnosis not present

## 2021-03-14 DIAGNOSIS — K5790 Diverticulosis of intestine, part unspecified, without perforation or abscess without bleeding: Secondary | ICD-10-CM | POA: Diagnosis not present

## 2021-03-14 DIAGNOSIS — J9611 Chronic respiratory failure with hypoxia: Secondary | ICD-10-CM | POA: Diagnosis not present

## 2021-03-14 DIAGNOSIS — Z9181 History of falling: Secondary | ICD-10-CM | POA: Diagnosis not present

## 2021-03-14 DIAGNOSIS — M1990 Unspecified osteoarthritis, unspecified site: Secondary | ICD-10-CM | POA: Diagnosis not present

## 2021-03-14 DIAGNOSIS — L89153 Pressure ulcer of sacral region, stage 3: Secondary | ICD-10-CM | POA: Diagnosis not present

## 2021-03-14 DIAGNOSIS — L89150 Pressure ulcer of sacral region, unstageable: Secondary | ICD-10-CM | POA: Diagnosis not present

## 2021-03-14 DIAGNOSIS — J701 Chronic and other pulmonary manifestations due to radiation: Secondary | ICD-10-CM | POA: Diagnosis not present

## 2021-03-14 DIAGNOSIS — J90 Pleural effusion, not elsewhere classified: Secondary | ICD-10-CM | POA: Diagnosis not present

## 2021-03-14 DIAGNOSIS — I2510 Atherosclerotic heart disease of native coronary artery without angina pectoris: Secondary | ICD-10-CM | POA: Diagnosis not present

## 2021-03-14 DIAGNOSIS — Z8673 Personal history of transient ischemic attack (TIA), and cerebral infarction without residual deficits: Secondary | ICD-10-CM | POA: Diagnosis not present

## 2021-03-15 DIAGNOSIS — L89150 Pressure ulcer of sacral region, unstageable: Secondary | ICD-10-CM | POA: Diagnosis not present

## 2021-03-15 DIAGNOSIS — I5030 Unspecified diastolic (congestive) heart failure: Secondary | ICD-10-CM | POA: Diagnosis not present

## 2021-03-15 DIAGNOSIS — I252 Old myocardial infarction: Secondary | ICD-10-CM | POA: Diagnosis not present

## 2021-03-15 DIAGNOSIS — Z9181 History of falling: Secondary | ICD-10-CM | POA: Diagnosis not present

## 2021-03-15 DIAGNOSIS — Z951 Presence of aortocoronary bypass graft: Secondary | ICD-10-CM | POA: Diagnosis not present

## 2021-03-15 DIAGNOSIS — F32A Depression, unspecified: Secondary | ICD-10-CM | POA: Diagnosis not present

## 2021-03-15 DIAGNOSIS — Z85118 Personal history of other malignant neoplasm of bronchus and lung: Secondary | ICD-10-CM | POA: Diagnosis not present

## 2021-03-15 DIAGNOSIS — D649 Anemia, unspecified: Secondary | ICD-10-CM | POA: Diagnosis not present

## 2021-03-15 DIAGNOSIS — M1990 Unspecified osteoarthritis, unspecified site: Secondary | ICD-10-CM | POA: Diagnosis not present

## 2021-03-15 DIAGNOSIS — I110 Hypertensive heart disease with heart failure: Secondary | ICD-10-CM | POA: Diagnosis not present

## 2021-03-15 DIAGNOSIS — I700 Atherosclerosis of aorta: Secondary | ICD-10-CM | POA: Diagnosis not present

## 2021-03-15 DIAGNOSIS — J9611 Chronic respiratory failure with hypoxia: Secondary | ICD-10-CM | POA: Diagnosis not present

## 2021-03-15 DIAGNOSIS — I480 Paroxysmal atrial fibrillation: Secondary | ICD-10-CM | POA: Diagnosis not present

## 2021-03-15 DIAGNOSIS — R918 Other nonspecific abnormal finding of lung field: Secondary | ICD-10-CM | POA: Insufficient documentation

## 2021-03-15 DIAGNOSIS — L89153 Pressure ulcer of sacral region, stage 3: Secondary | ICD-10-CM | POA: Diagnosis not present

## 2021-03-15 DIAGNOSIS — Z7982 Long term (current) use of aspirin: Secondary | ICD-10-CM | POA: Diagnosis not present

## 2021-03-15 DIAGNOSIS — K219 Gastro-esophageal reflux disease without esophagitis: Secondary | ICD-10-CM | POA: Diagnosis not present

## 2021-03-15 DIAGNOSIS — I2510 Atherosclerotic heart disease of native coronary artery without angina pectoris: Secondary | ICD-10-CM | POA: Diagnosis not present

## 2021-03-15 DIAGNOSIS — K5790 Diverticulosis of intestine, part unspecified, without perforation or abscess without bleeding: Secondary | ICD-10-CM | POA: Diagnosis not present

## 2021-03-15 DIAGNOSIS — Z87891 Personal history of nicotine dependence: Secondary | ICD-10-CM | POA: Diagnosis not present

## 2021-03-15 DIAGNOSIS — E7800 Pure hypercholesterolemia, unspecified: Secondary | ICD-10-CM | POA: Diagnosis not present

## 2021-03-15 DIAGNOSIS — J701 Chronic and other pulmonary manifestations due to radiation: Secondary | ICD-10-CM | POA: Diagnosis not present

## 2021-03-15 DIAGNOSIS — J90 Pleural effusion, not elsewhere classified: Secondary | ICD-10-CM | POA: Diagnosis not present

## 2021-03-15 DIAGNOSIS — J439 Emphysema, unspecified: Secondary | ICD-10-CM | POA: Diagnosis not present

## 2021-03-15 DIAGNOSIS — Z8673 Personal history of transient ischemic attack (TIA), and cerebral infarction without residual deficits: Secondary | ICD-10-CM | POA: Diagnosis not present

## 2021-03-15 NOTE — Assessment & Plan Note (Signed)
-   Continue supplemental oxygen 3L with exertion and at night to maintain O2 > 88-90%

## 2021-03-15 NOTE — Assessment & Plan Note (Signed)
-   Patient carries a dx COPD but has had no formal pulmonary function testing. She is not on scheduled bronchodilators. She has tried stiolto in the past. Recommend getting PFTs at follow-up. Advised she start using ipratropium-albuterol four times a day.

## 2021-03-15 NOTE — Assessment & Plan Note (Addendum)
-   Patient was admitted on 02/28/21-03/03/21 at [facility] for COPD exacerbation, radiation fibrosis and RUL pulmonary infiltrate. Imaging revealed no PE however it did note that patient had post radiation fibrosis and ground glass pulmonary infiltrate. Treated with IV steriods, bronchodilators and antibiotics (cefemine, azithromycin and Augmentin). Oncology did not feel thios was related to drug reaction because patient had nto had any chemotherapy or immunotherapy in months. They also feel non-small cell lung cancer is stable and does not appear to be ana ctive issue at this time. They had recommended repeating CT imaging in 2-4 weeks, if not better will likely need bronchoscopy to evaluate right lobe infiltrate.

## 2021-03-15 NOTE — Assessment & Plan Note (Signed)
-   Advised she call oncologist about scheduling CT chest in 1-2 weeks to follow-up on post radiation fibrosis RLL and GGO infiltrate RUL

## 2021-03-19 DIAGNOSIS — J9601 Acute respiratory failure with hypoxia: Secondary | ICD-10-CM | POA: Diagnosis not present

## 2021-03-20 DIAGNOSIS — Z8673 Personal history of transient ischemic attack (TIA), and cerebral infarction without residual deficits: Secondary | ICD-10-CM | POA: Diagnosis not present

## 2021-03-20 DIAGNOSIS — Z85118 Personal history of other malignant neoplasm of bronchus and lung: Secondary | ICD-10-CM | POA: Diagnosis not present

## 2021-03-20 DIAGNOSIS — D649 Anemia, unspecified: Secondary | ICD-10-CM | POA: Diagnosis not present

## 2021-03-20 DIAGNOSIS — Z7982 Long term (current) use of aspirin: Secondary | ICD-10-CM | POA: Diagnosis not present

## 2021-03-20 DIAGNOSIS — K219 Gastro-esophageal reflux disease without esophagitis: Secondary | ICD-10-CM | POA: Diagnosis not present

## 2021-03-20 DIAGNOSIS — F32A Depression, unspecified: Secondary | ICD-10-CM | POA: Diagnosis not present

## 2021-03-20 DIAGNOSIS — I700 Atherosclerosis of aorta: Secondary | ICD-10-CM | POA: Diagnosis not present

## 2021-03-20 DIAGNOSIS — I2510 Atherosclerotic heart disease of native coronary artery without angina pectoris: Secondary | ICD-10-CM | POA: Diagnosis not present

## 2021-03-20 DIAGNOSIS — I252 Old myocardial infarction: Secondary | ICD-10-CM | POA: Diagnosis not present

## 2021-03-20 DIAGNOSIS — Z951 Presence of aortocoronary bypass graft: Secondary | ICD-10-CM | POA: Diagnosis not present

## 2021-03-20 DIAGNOSIS — I5030 Unspecified diastolic (congestive) heart failure: Secondary | ICD-10-CM | POA: Diagnosis not present

## 2021-03-20 DIAGNOSIS — M1990 Unspecified osteoarthritis, unspecified site: Secondary | ICD-10-CM | POA: Diagnosis not present

## 2021-03-20 DIAGNOSIS — L89153 Pressure ulcer of sacral region, stage 3: Secondary | ICD-10-CM | POA: Diagnosis not present

## 2021-03-20 DIAGNOSIS — Z9181 History of falling: Secondary | ICD-10-CM | POA: Diagnosis not present

## 2021-03-20 DIAGNOSIS — E7800 Pure hypercholesterolemia, unspecified: Secondary | ICD-10-CM | POA: Diagnosis not present

## 2021-03-20 DIAGNOSIS — J701 Chronic and other pulmonary manifestations due to radiation: Secondary | ICD-10-CM | POA: Diagnosis not present

## 2021-03-20 DIAGNOSIS — I110 Hypertensive heart disease with heart failure: Secondary | ICD-10-CM | POA: Diagnosis not present

## 2021-03-20 DIAGNOSIS — I480 Paroxysmal atrial fibrillation: Secondary | ICD-10-CM | POA: Diagnosis not present

## 2021-03-20 DIAGNOSIS — J9611 Chronic respiratory failure with hypoxia: Secondary | ICD-10-CM | POA: Diagnosis not present

## 2021-03-20 DIAGNOSIS — Z87891 Personal history of nicotine dependence: Secondary | ICD-10-CM | POA: Diagnosis not present

## 2021-03-20 DIAGNOSIS — J90 Pleural effusion, not elsewhere classified: Secondary | ICD-10-CM | POA: Diagnosis not present

## 2021-03-20 DIAGNOSIS — J439 Emphysema, unspecified: Secondary | ICD-10-CM | POA: Diagnosis not present

## 2021-03-20 DIAGNOSIS — K5790 Diverticulosis of intestine, part unspecified, without perforation or abscess without bleeding: Secondary | ICD-10-CM | POA: Diagnosis not present

## 2021-03-20 NOTE — Progress Notes (Signed)
[name]  [address] [place],  [place]  [address] [phone]  Clinic Day:  03/25/2021  Referring physician: Lowella Dandy, NP  This document serves as a record of services personally performed by Hosie Poisson, MD. It was created on their behalf by Curry,Lauren E, a trained medical scribe. The creation of this record is based on the scribe's personal observations and the provider's statements to them.  ASSESSMENT & PLAN:   Assessment & Plan:  Stage IIIB (T1b N3 M0) squamous cell lung cancer, diagnosed in June 2022.  She received concurrent chemoradiation and completed 6 cycles of carboplatin/Abraxane.  I do not feel she needs further chemotherapy at this time.  She completed radiation therapy on September 6th, and CT imaging has shown a good response. She started maintenance immunotherapy with durvalumab for 1 year on October 11th and only received 2 cycles. Her last dose of immunotherapy was on October 27th. CT imaging from February 2023 revealed no evidence of progressive disease.   2.  COVID infection, August 2022, requiring hospitalization for pneumonia from November 8th to December 1st. She is slowly returning back to baseline.   3.  Osteopenia.  She is up to date on bone density scans as of April 2022, which are scheduled through Dr. Delena Bali office.  4.  Anemia, improved, but with her symptoms we will order iron studies, B12 and folate for further evaluation.    She is doing better, but has not fully returned back to baseline. She remains weak, fatigued and short of breath. As above, we will ask home health draw iron studies, B12 and folate to look for any correctable deficiencies. She may continue supplemental oxygen as needed. She will continue to work on her nutrition. I will send in Ambien 5 mg to use at bedtime for sleep. We will plan to see her back in 1 month with CBC and CMP for repeat evaluation. In 2 months we will plan to repeat CT  chest. She and her husband understand and agree with this plan of care. I have answered their questions and they know to call with any concerns.   I provided 20 minutes of face-to-face time during this this encounter and > 50% was spent counseling as documented under my assessment and plan.     [facility] [address] [place] [address] Dept: [phone] Dept Fax: [phone]   No orders of the defined types were placed in this encounter.      CHIEF COMPLAINT:  CC: Stage IIIB squamous cell lung cancer  Current Treatment:  Maintenance durvalumab   HISTORY OF PRESENT ILLNESS:  Stage IIIB (T1b, N3, M0) squamous cell lung cancer diagnosed in June 2022.  This began when the patient started to experience intermittent chest pain and shortness of breath, and followed up with cardiology for further evaluation.  Chest x-ray was abnormal.  Due to her history of COPD and long history of smoking she was referred to pulmonology.  CT chest from May revealed a new highly suspicious spiculated pulmonary nodule within the superior lateral aspect of the right lower lobe measuring 1.5 x 1.0 x 1.4 cm with central small cavitation highly suspicious to be a neoplastic nodule.  There was also nodularity along the upper wall of the left mainstem bronchus of uncertain etiology but also suspicious for additional neoplastic process.  CT super D of the chest from June revealed  enlargement of the thick-walled spiculated cavitary nodule in the superior segment of the right lower lobe measuring 1.6 x 1.6.  There was also numerous mediastinal lymph nodes some of which are enlarged including high AP window lymph node measuring 1.2 cm, lower right paratracheal lymph node measuring 1.4 cm. Final pathology confirmed malignant cells consistent with non-small cell carcinoma. Overall, the morphology and immunophenotype favor a squamous cell  carcinoma.  Staging PET scan showed the hypermetabolic right lower lobe lung lesion consistent with primary lung neoplasm.  There was also mediastinal and hilar lymphadenopathy but no findings to suggest abdominal/pelvic metastatic disease or osseous metastatic disease for a T1b N3 M0, stage IIIB.  MRI brain was negative for metastasis.  She was referred to Dr. Julien Nordmann, oncology, and was started concurrent chemoradiation with weekly carboplatin/paclitaxel.  Her 1st cycle was July 11th.  With the 3rd cycle, she developed a reaction to paclitaxel and so was switched to Abraxane.  She has received 6 cycles of therapy.    She has a medical history significant for coronary artery disease status post stent placement, carotid artery disease, colon obstruction status post surgical intervention, depression, COPD, DVT, GERD, intracranial hemorrhage, hypertension, strokes, anemia and Barrett's esophagus. [name] states that she has quit smoking since her diagnosis in June after 60 pack years.  She contracted COVID back in August and was quite ill and required hospitalization for 5 days.  She has had hypokalemia and supplement was ordered.   Oncology History  Malignant neoplasm of bronchus of right lower lobe ([facility])  07/10/2020 Initial Diagnosis   Stage III squamous cell carcinoma of right lung ([facility])   07/10/2020 Cancer Staging   Staging form: Lung, AJCC 8th Edition - Clinical: Stage IIIB (cT1b, cN3, cM0) - Signed by Curt Bears, MD on 07/10/2020    07/30/2020 - 09/17/2020 Chemotherapy          10/25/2020 Imaging   CT chest: 1. Interval decrease in size of the right lower lobe lung lesion.  2. Progressive airspace opacity and atelectasis in the left upper  lobe.  3. Persistent small left effusion and left lower lobe atelectasis or  infiltrate.  4. New 15.5 mm sub solid nodular lesion in the right upper lobe just above the minor fissure. It is possible this is an area of  inflammation but attention on  future studies is suggested. Recommend follow-up noncontrast chest CT in 3-4 months.  5. Stable emphysematous changes and pulmonary scarring.  6. Slightly smaller borderline enlarged mediastinal and hilar lymph  nodes.  7. Stable atherosclerotic calcifications involving the aorta and  branch vessels including three-vessel coronary artery calcifications.    10/30/2020 -  Chemotherapy   Patient is on Treatment Plan : LUNG Durvalumab q14d        INTERVAL HISTORY:  Jalayiah is here for follow up after another admission to the hospital for dyspnea and pneumonia. She notes severe fatigue, generalized weakness and shortness of breath with minimal exertion. She has required supplemental oxygen 3 L per nasal cannula intermittently throughout the day and at night. She has had insomnia, which has improved with Ambien in the past. I will refill this for her today. She does admit to depression as she has not been able to continue her normal activities. She continues Wellbutrin 150 mg daily as well as Zoloft 200 mg daily. Hemoglobin has mildly improved from 9.6 to 10.4, and white count and platelets are normal. Chemistries are unremarkable. Her  appetite is fair, and she has gained 2  and 1/2 pounds since her last visit. She has tried mirtazapine 45 mg at bedtime without improvement.  She denies fever, chills or other signs of infection.  She denies nausea, vomiting, bowel issues, or abdominal pain.  She denies sore throat, cough, dyspnea, or chest pain.  REVIEW OF SYSTEMS:  Review of Systems  Constitutional:  Positive for fatigue. Negative for appetite change, chills, fever and unexpected weight change.       Generalized weakness, moderate  HENT:  Negative.    Eyes: Negative.   Respiratory:  Positive for shortness of breath (with minimal exertion, on supplemental oxygen). Negative for chest tightness, cough, hemoptysis and wheezing.   Cardiovascular: Negative.  Negative for chest pain, leg swelling and  palpitations.  Gastrointestinal: Negative.  Negative for abdominal distention, abdominal pain, blood in stool, constipation, diarrhea, nausea and vomiting.  Endocrine: Negative.   Genitourinary: Negative.  Negative for difficulty urinating, dysuria, frequency and hematuria.   Musculoskeletal:  Negative for arthralgias, back pain, flank pain, gait problem and myalgias.  Skin:  Negative for wound.  Neurological:  Positive for extremity weakness (using a wheelchair). Negative for dizziness, gait problem, headaches, light-headedness, numbness, seizures and speech difficulty.  Hematological: Negative.   Psychiatric/Behavioral:  Positive for depression and sleep disturbance (insomnia). The patient is not nervous/anxious.     VITALS:  Blood pressure 137/72, pulse 81, temperature 98.5 F (36.9 C), temperature source Oral, resp. rate 19, weight 132 lb (59.9 kg), SpO2 93 %.  Wt Readings from Last 3 Encounters:  03/25/21 132 lb (59.9 kg)  03/13/21 130 lb 9.6 oz (59.2 kg)  02/07/21 129 lb 9.6 oz (58.8 kg)    Body mass index is 26.66 kg/m.  Performance status (ECOG): 1 - Symptomatic but completely ambulatory  PHYSICAL EXAM:  Physical Exam Constitutional:      General: She is not in acute distress.    Appearance: Normal appearance. She is normal weight.  HENT:     Head: Normocephalic and atraumatic.  Eyes:     General: No scleral icterus.    Extraocular Movements: Extraocular movements intact.     Conjunctiva/sclera: Conjunctivae normal.     Pupils: Pupils are equal, round, and reactive to light.  Cardiovascular:     Rate and Rhythm: Normal rate and regular rhythm.     Pulses: Normal pulses.     Heart sounds: Normal heart sounds. No murmur heard.   No friction rub. No gallop.  Pulmonary:     Effort: Pulmonary effort is normal. No respiratory distress.     Breath sounds: Decreased breath sounds (of the left base) and rhonchi (course on inspiration in all lung fields) present.  Abdominal:      General: Bowel sounds are normal. There is no distension.     Palpations: Abdomen is soft. There is no hepatomegaly, splenomegaly or mass.     Tenderness: There is no abdominal tenderness.  Musculoskeletal:        General: Normal range of motion.     Cervical back: Normal range of motion and neck supple.     Right lower leg: No edema.     Left lower leg: No edema.  Lymphadenopathy:     Cervical: No cervical adenopathy.  Skin:    General: Skin is warm and dry.  Neurological:     General: No focal deficit present.     Mental Status: She is alert and oriented to person, place, and time. Mental status is at baseline.  Psychiatric:  Mood and Affect: Mood normal.        Behavior: Behavior normal.        Thought Content: Thought content normal.        Judgment: Judgment normal.    LABS:   CBC Latest Ref Rng & Units 03/25/2021 02/26/2021 02/07/2021  WBC - 5.3 7.3 7.3  Hemoglobin 12.0 - 16.0 10.4(A) 10.3(A) 11.1(A)  Hematocrit 36 - 46 33(A) 31(A) 34(A)  Platelets 150 - 399 181 223 223   CMP Latest Ref Rng & Units 03/25/2021 02/26/2021 02/07/2021  Glucose 70 - 99 mg/dL - - -  BUN 4 - 21 17 15 21   Creatinine 0.5 - 1.1 0.7 0.7 0.8  Sodium 137 - 147 141 141 138  Potassium 3.4 - 5.3 3.9 4.0 3.6  Chloride 99 - 108 113(A) 109(A) 106  CO2 13 - 22 23(A) 25(A) 23(A)  Calcium 8.7 - 10.7 9.4 9.4 9.4  Total Protein 6.5 - 8.1 g/dL - - -  Total Bilirubin 0.3 - 1.2 mg/dL - - -  Alkaline Phos 25 - 125 75 86 92  AST 13 - 35 20 26 29   ALT 7 - 35 18 18 32     Lab Results  Component Value Date   CEA1 7.1 02/26/2021   /  CEA  Date Value Ref Range Status  02/26/2021 7.1  Final    Lab Results  Component Value Date   TIBC 417 11/13/2020   FERRITIN 29 11/13/2020   IRONPCTSAT 22 11/13/2020   IRONPCTSAT 7.7 (L) 06/19/2020   Lab Results  Component Value Date   LDH 166 06/19/2020    STUDIES:  No results found.     EXAM: 02/26/2021 CT CHEST WITH CONTRAST   TECHNIQUE:   Multidetector CT imaging of the chest was performed during  intravenous contrast administration.   RADIATION DOSE REDUCTION: This exam was performed according to the  departmental dose-optimization program which includes automated  exposure control, adjustment of the mA and/or kV according to  patient size and/or use of iterative reconstruction technique.   CONTRAST: 60 mL of Isovue 370.   COMPARISON: Chest CT 11/28/2020.   FINDINGS:  Cardiovascular: Heart size is mildly enlarged. There is no  significant pericardial fluid, thickening or pericardial  calcification. There is aortic atherosclerosis, as well as  atherosclerosis of the great vessels of the mediastinum and the  coronary arteries, including calcified atherosclerotic plaque in the  left main, left anterior descending, left circumflex and right  coronary arteries.  Mediastinum/Nodes: Multiple prominent borderline enlarged  mediastinal and bilateral hilar lymph nodes are noted, largest of  which is in the low right paratracheal nodal station measuring 1.2  cm in short axis, similar to the prior study. Esophagus is  unremarkable in appearance. No axillary lymphadenopathy.  Lungs/Pleura: Fiducial markers in the right lower lobe are again  noted. Increasing volume loss and architectural distortion in the  right lower lobe adjacent to the fiducial markers likely reflect  areas of postradiation fibrosis. Patchy multifocal interstitial and  airspace disease is noted throughout the lungs bilaterally,  progressive compared to the prior examination. Increasing moderate  bilateral pleural effusions. Diffuse bronchial wall thickening with  mild to moderate centrilobular and paraseptal emphysema.  Upper Abdomen: Aortic atherosclerosis.  Musculoskeletal: Old healed fractures of the lateral right sixth,  seventh and eighth ribs are noted. There are no aggressive appearing  lytic or blastic lesions noted in the visualized portions of  the  skeleton.   IMPRESSION:  1. Worsening  aeration throughout the lungs bilaterally. Given the  chronicity of these findings, clinical correlation for signs and  symptoms of drug reaction is recommended, particularly if the  patient is undergoing chemotherapy. Alternatively, progressive  atypical infection could have a similar appearance.  2. Previously noted right lower lobe neoplasm is now completely  obscured by evolving postradiation fibrosis. Continued attention on  follow-up studies is recommended.  3. Increasing moderate bilateral pleural effusions.  4. Mild diffuse bronchial wall thickening with mild to moderate  centrilobular and paraseptal emphysema; imaging findings suggestive  of underlying COPD.  5. Aortic atherosclerosis, in addition to left main and three-vessel  coronary artery disease.  6. Mild cardiomegaly.  Aortic Atherosclerosis (ICD10-I70.0) and Emphysema (ICD10-J43.9).   HISTORY:   Allergies:  Allergies  Allergen Reactions   Pregabalin Swelling    Tongue swelling   Paclitaxel    Sulfonamide Derivatives Swelling    Childhood reaction    Current Medications: Current Outpatient Medications  Medication Sig Dispense Refill   aspirin EC 81 MG tablet Take 2 tablets (162 mg total) by mouth in the morning. Okay to restart this medication on 07/04/2020 30 tablet 11   atorvastatin (LIPITOR) 80 MG tablet Take 80 mg by mouth every evening.      budesonide (PULMICORT) 0.5 MG/2ML nebulizer solution Take by nebulization.     buPROPion (WELLBUTRIN XL) 150 MG 24 hr tablet Take 1 tablet (150 mg total) by mouth daily. 90 tablet 3   fish oil-omega-3 fatty acids 1000 MG capsule Take 1,000 mg by mouth in the morning.     fluticasone (FLONASE) 50 MCG/ACT nasal spray Place 1 spray into both nostrils daily as needed for allergies.     folic acid (FOLVITE) 588 MCG tablet Take 800 mcg by mouth in the morning.     ipratropium-albuterol (DUONEB) 0.5-2.5 (3) MG/3ML SOLN Take 3 mLs  by nebulization every 6 (six) hours as needed. 360 mL 0   metoprolol succinate (TOPROL-XL) 25 MG 24 hr tablet Take by mouth.     mirtazapine (REMERON) 45 MG tablet Take 45 mg by mouth at bedtime.      nitroGLYCERIN (NITROSTAT) 0.4 MG SL tablet DISSOLVE 1 TABLET UNDER THE TONGUE EVERY 5 MINUTES AS  NEEDED FOR CHEST PAIN. MAX  OF 3 TABLETS IN 15 MINUTES. CALL 911 IF PAIN PERSISTS. 100 tablet 3   ondansetron (ZOFRAN) 4 MG tablet Take 4 mg by mouth every 8 (eight) hours as needed for nausea or vomiting.     pantoprazole (PROTONIX) 40 MG tablet Take 40 mg by mouth in the morning.     potassium chloride SA (KLOR-CON M) 20 MEQ tablet Take 20 mEq by mouth daily.     sertraline (ZOLOFT) 100 MG tablet Take 200 mg by mouth in the morning.     zolpidem (AMBIEN) 5 MG tablet TAKE 1 TABLET BY MOUTH AT BEDTIME AS NEEDED FOR SLEEP 30 tablet 0   azithromycin (ZITHROMAX Z-PAK) 250 MG tablet 2 pills today, then 1 pill daily (Patient not taking: Reported on 03/25/2021) 6 each 0   oxyCODONE (OXY IR/ROXICODONE) 5 MG immediate release tablet Take by mouth. (Patient not taking: Reported on 03/25/2021)     No current facility-administered medications for this visit.    I, Rita Ohara, am acting as scribe for Derwood Kaplan, MD  I have reviewed this report as typed by the medical scribe, and it is complete and accurate.

## 2021-03-24 ENCOUNTER — Encounter: Payer: Self-pay | Admitting: Oncology

## 2021-03-25 ENCOUNTER — Other Ambulatory Visit: Payer: Self-pay | Admitting: Oncology

## 2021-03-25 ENCOUNTER — Other Ambulatory Visit: Payer: Self-pay

## 2021-03-25 ENCOUNTER — Encounter: Payer: Self-pay | Admitting: Oncology

## 2021-03-25 ENCOUNTER — Inpatient Hospital Stay: Payer: Medicare Other

## 2021-03-25 ENCOUNTER — Inpatient Hospital Stay: Payer: Medicare Other | Attending: Internal Medicine | Admitting: Oncology

## 2021-03-25 ENCOUNTER — Telehealth: Payer: Self-pay

## 2021-03-25 VITALS — BP 137/72 | HR 81 | Temp 98.5°F | Resp 19 | Wt 132.0 lb

## 2021-03-25 DIAGNOSIS — Z79899 Other long term (current) drug therapy: Secondary | ICD-10-CM | POA: Diagnosis not present

## 2021-03-25 DIAGNOSIS — C3431 Malignant neoplasm of lower lobe, right bronchus or lung: Secondary | ICD-10-CM | POA: Diagnosis not present

## 2021-03-25 DIAGNOSIS — G4700 Insomnia, unspecified: Secondary | ICD-10-CM

## 2021-03-25 DIAGNOSIS — D539 Nutritional anemia, unspecified: Secondary | ICD-10-CM | POA: Insufficient documentation

## 2021-03-25 LAB — BASIC METABOLIC PANEL
BUN: 17 (ref 4–21)
CO2: 23 — AB (ref 13–22)
Chloride: 113 — AB (ref 99–108)
Creatinine: 0.7 (ref 0.5–1.1)
Glucose: 106
Potassium: 3.9 (ref 3.4–5.3)
Sodium: 141 (ref 137–147)

## 2021-03-25 LAB — CBC AND DIFFERENTIAL
HCT: 33 — AB (ref 36–46)
Hemoglobin: 10.4 — AB (ref 12.0–16.0)
Neutrophils Absolute: 3.6
Platelets: 181 (ref 150–399)
WBC: 5.3

## 2021-03-25 LAB — COMPREHENSIVE METABOLIC PANEL
Albumin: 3.6 (ref 3.5–5.0)
Calcium: 9.4 (ref 8.7–10.7)

## 2021-03-25 LAB — HEPATIC FUNCTION PANEL
ALT: 18 (ref 7–35)
AST: 20 (ref 13–35)
Alkaline Phosphatase: 75 (ref 25–125)
Bilirubin, Total: 0.4

## 2021-03-25 LAB — CBC: RBC: 3.53 — AB (ref 3.87–5.11)

## 2021-03-25 LAB — TSH: TSH: 1.637 u[IU]/mL (ref 0.350–4.500)

## 2021-03-25 MED ORDER — ZOLPIDEM TARTRATE 5 MG PO TABS: 5.0000 mg | ORAL_TABLET | Freq: Every evening | ORAL | 0 refills | Status: DC | PRN

## 2021-03-25 NOTE — Telephone Encounter (Signed)
Attempted to contact patient. No answer. 

## 2021-03-25 NOTE — Telephone Encounter (Signed)
-----   Message from Derwood Kaplan, MD sent at 03/25/2021  4:41 PM EST ----- ?Regarding: labs ?I need add'l labs but [name] does not have enough blood.  If she still has home health nurses coming out, ask them to draw B12, folate, iron/TIBC and ferritin. ? ?

## 2021-03-25 NOTE — Progress Notes (Signed)
Patient here for oncology follow-up appointment, expresses concerns of fatigue and trouble sleeping ?

## 2021-03-26 DIAGNOSIS — R0789 Other chest pain: Secondary | ICD-10-CM | POA: Diagnosis not present

## 2021-03-26 LAB — T4: T4, Total: 5.4 ug/dL (ref 4.5–12.0)

## 2021-03-27 DIAGNOSIS — I2510 Atherosclerotic heart disease of native coronary artery without angina pectoris: Secondary | ICD-10-CM | POA: Diagnosis not present

## 2021-03-27 DIAGNOSIS — Z8673 Personal history of transient ischemic attack (TIA), and cerebral infarction without residual deficits: Secondary | ICD-10-CM | POA: Diagnosis not present

## 2021-03-27 DIAGNOSIS — Z7982 Long term (current) use of aspirin: Secondary | ICD-10-CM | POA: Diagnosis not present

## 2021-03-27 DIAGNOSIS — Z85118 Personal history of other malignant neoplasm of bronchus and lung: Secondary | ICD-10-CM | POA: Diagnosis not present

## 2021-03-27 DIAGNOSIS — I110 Hypertensive heart disease with heart failure: Secondary | ICD-10-CM | POA: Diagnosis not present

## 2021-03-27 DIAGNOSIS — E7800 Pure hypercholesterolemia, unspecified: Secondary | ICD-10-CM | POA: Diagnosis not present

## 2021-03-27 DIAGNOSIS — I480 Paroxysmal atrial fibrillation: Secondary | ICD-10-CM | POA: Diagnosis not present

## 2021-03-27 DIAGNOSIS — J701 Chronic and other pulmonary manifestations due to radiation: Secondary | ICD-10-CM | POA: Diagnosis not present

## 2021-03-27 DIAGNOSIS — M1990 Unspecified osteoarthritis, unspecified site: Secondary | ICD-10-CM | POA: Diagnosis not present

## 2021-03-27 DIAGNOSIS — F32A Depression, unspecified: Secondary | ICD-10-CM | POA: Diagnosis not present

## 2021-03-27 DIAGNOSIS — J90 Pleural effusion, not elsewhere classified: Secondary | ICD-10-CM | POA: Diagnosis not present

## 2021-03-27 DIAGNOSIS — I5030 Unspecified diastolic (congestive) heart failure: Secondary | ICD-10-CM | POA: Diagnosis not present

## 2021-03-27 DIAGNOSIS — Z951 Presence of aortocoronary bypass graft: Secondary | ICD-10-CM | POA: Diagnosis not present

## 2021-03-27 DIAGNOSIS — J439 Emphysema, unspecified: Secondary | ICD-10-CM | POA: Diagnosis not present

## 2021-03-27 DIAGNOSIS — Z9181 History of falling: Secondary | ICD-10-CM | POA: Diagnosis not present

## 2021-03-27 DIAGNOSIS — J9611 Chronic respiratory failure with hypoxia: Secondary | ICD-10-CM | POA: Diagnosis not present

## 2021-03-27 DIAGNOSIS — K5790 Diverticulosis of intestine, part unspecified, without perforation or abscess without bleeding: Secondary | ICD-10-CM | POA: Diagnosis not present

## 2021-03-27 DIAGNOSIS — I700 Atherosclerosis of aorta: Secondary | ICD-10-CM | POA: Diagnosis not present

## 2021-03-27 DIAGNOSIS — D649 Anemia, unspecified: Secondary | ICD-10-CM | POA: Diagnosis not present

## 2021-03-27 DIAGNOSIS — K219 Gastro-esophageal reflux disease without esophagitis: Secondary | ICD-10-CM | POA: Diagnosis not present

## 2021-03-27 DIAGNOSIS — I252 Old myocardial infarction: Secondary | ICD-10-CM | POA: Diagnosis not present

## 2021-03-27 DIAGNOSIS — Z87891 Personal history of nicotine dependence: Secondary | ICD-10-CM | POA: Diagnosis not present

## 2021-03-27 DIAGNOSIS — L89153 Pressure ulcer of sacral region, stage 3: Secondary | ICD-10-CM | POA: Diagnosis not present

## 2021-03-31 ENCOUNTER — Encounter: Payer: Self-pay | Admitting: Oncology

## 2021-03-31 ENCOUNTER — Encounter: Payer: Self-pay | Admitting: Hematology and Oncology

## 2021-04-01 ENCOUNTER — Telehealth: Payer: Self-pay

## 2021-04-01 NOTE — Telephone Encounter (Signed)
-----   Message from Derwood Kaplan, MD sent at 03/31/2021  2:29 PM EDT ----- ?Regarding: labs ?We need to ask home health to draw labs, did I tell you that yet?  Iron/TIBC, B12, folate and ferritin. ? ?

## 2021-04-02 ENCOUNTER — Other Ambulatory Visit: Payer: Self-pay | Admitting: Primary Care

## 2021-04-02 DIAGNOSIS — J90 Pleural effusion, not elsewhere classified: Secondary | ICD-10-CM | POA: Diagnosis not present

## 2021-04-02 DIAGNOSIS — I480 Paroxysmal atrial fibrillation: Secondary | ICD-10-CM | POA: Diagnosis not present

## 2021-04-02 DIAGNOSIS — D539 Nutritional anemia, unspecified: Secondary | ICD-10-CM | POA: Diagnosis not present

## 2021-04-02 DIAGNOSIS — R5381 Other malaise: Secondary | ICD-10-CM | POA: Diagnosis not present

## 2021-04-02 DIAGNOSIS — C3491 Malignant neoplasm of unspecified part of right bronchus or lung: Secondary | ICD-10-CM | POA: Diagnosis not present

## 2021-04-02 DIAGNOSIS — J449 Chronic obstructive pulmonary disease, unspecified: Secondary | ICD-10-CM | POA: Diagnosis not present

## 2021-04-02 DIAGNOSIS — L89153 Pressure ulcer of sacral region, stage 3: Secondary | ICD-10-CM | POA: Diagnosis not present

## 2021-04-03 DIAGNOSIS — I5030 Unspecified diastolic (congestive) heart failure: Secondary | ICD-10-CM | POA: Diagnosis not present

## 2021-04-03 DIAGNOSIS — Z87891 Personal history of nicotine dependence: Secondary | ICD-10-CM | POA: Diagnosis not present

## 2021-04-03 DIAGNOSIS — L89153 Pressure ulcer of sacral region, stage 3: Secondary | ICD-10-CM | POA: Diagnosis not present

## 2021-04-03 DIAGNOSIS — E7800 Pure hypercholesterolemia, unspecified: Secondary | ICD-10-CM | POA: Diagnosis not present

## 2021-04-03 DIAGNOSIS — I2510 Atherosclerotic heart disease of native coronary artery without angina pectoris: Secondary | ICD-10-CM | POA: Diagnosis not present

## 2021-04-03 DIAGNOSIS — Z85118 Personal history of other malignant neoplasm of bronchus and lung: Secondary | ICD-10-CM | POA: Diagnosis not present

## 2021-04-03 DIAGNOSIS — I110 Hypertensive heart disease with heart failure: Secondary | ICD-10-CM | POA: Diagnosis not present

## 2021-04-03 DIAGNOSIS — K5790 Diverticulosis of intestine, part unspecified, without perforation or abscess without bleeding: Secondary | ICD-10-CM | POA: Diagnosis not present

## 2021-04-03 DIAGNOSIS — M1990 Unspecified osteoarthritis, unspecified site: Secondary | ICD-10-CM | POA: Diagnosis not present

## 2021-04-03 DIAGNOSIS — D649 Anemia, unspecified: Secondary | ICD-10-CM | POA: Diagnosis not present

## 2021-04-03 DIAGNOSIS — F32A Depression, unspecified: Secondary | ICD-10-CM | POA: Diagnosis not present

## 2021-04-03 DIAGNOSIS — I252 Old myocardial infarction: Secondary | ICD-10-CM | POA: Diagnosis not present

## 2021-04-03 DIAGNOSIS — J701 Chronic and other pulmonary manifestations due to radiation: Secondary | ICD-10-CM | POA: Diagnosis not present

## 2021-04-03 DIAGNOSIS — I480 Paroxysmal atrial fibrillation: Secondary | ICD-10-CM | POA: Diagnosis not present

## 2021-04-03 DIAGNOSIS — J90 Pleural effusion, not elsewhere classified: Secondary | ICD-10-CM | POA: Diagnosis not present

## 2021-04-03 DIAGNOSIS — J439 Emphysema, unspecified: Secondary | ICD-10-CM | POA: Diagnosis not present

## 2021-04-03 DIAGNOSIS — K219 Gastro-esophageal reflux disease without esophagitis: Secondary | ICD-10-CM | POA: Diagnosis not present

## 2021-04-03 DIAGNOSIS — Z951 Presence of aortocoronary bypass graft: Secondary | ICD-10-CM | POA: Diagnosis not present

## 2021-04-03 DIAGNOSIS — Z8673 Personal history of transient ischemic attack (TIA), and cerebral infarction without residual deficits: Secondary | ICD-10-CM | POA: Diagnosis not present

## 2021-04-03 DIAGNOSIS — I700 Atherosclerosis of aorta: Secondary | ICD-10-CM | POA: Diagnosis not present

## 2021-04-03 DIAGNOSIS — Z9181 History of falling: Secondary | ICD-10-CM | POA: Diagnosis not present

## 2021-04-03 DIAGNOSIS — J9611 Chronic respiratory failure with hypoxia: Secondary | ICD-10-CM | POA: Diagnosis not present

## 2021-04-03 DIAGNOSIS — Z7982 Long term (current) use of aspirin: Secondary | ICD-10-CM | POA: Diagnosis not present

## 2021-04-04 DIAGNOSIS — K219 Gastro-esophageal reflux disease without esophagitis: Secondary | ICD-10-CM | POA: Diagnosis not present

## 2021-04-04 DIAGNOSIS — I2510 Atherosclerotic heart disease of native coronary artery without angina pectoris: Secondary | ICD-10-CM | POA: Diagnosis not present

## 2021-04-04 DIAGNOSIS — J9611 Chronic respiratory failure with hypoxia: Secondary | ICD-10-CM | POA: Diagnosis not present

## 2021-04-04 DIAGNOSIS — L89153 Pressure ulcer of sacral region, stage 3: Secondary | ICD-10-CM | POA: Diagnosis not present

## 2021-04-04 DIAGNOSIS — Z9181 History of falling: Secondary | ICD-10-CM | POA: Diagnosis not present

## 2021-04-04 DIAGNOSIS — I252 Old myocardial infarction: Secondary | ICD-10-CM | POA: Diagnosis not present

## 2021-04-04 DIAGNOSIS — Z85118 Personal history of other malignant neoplasm of bronchus and lung: Secondary | ICD-10-CM | POA: Diagnosis not present

## 2021-04-04 DIAGNOSIS — D649 Anemia, unspecified: Secondary | ICD-10-CM | POA: Diagnosis not present

## 2021-04-04 DIAGNOSIS — K5790 Diverticulosis of intestine, part unspecified, without perforation or abscess without bleeding: Secondary | ICD-10-CM | POA: Diagnosis not present

## 2021-04-04 DIAGNOSIS — I110 Hypertensive heart disease with heart failure: Secondary | ICD-10-CM | POA: Diagnosis not present

## 2021-04-04 DIAGNOSIS — I700 Atherosclerosis of aorta: Secondary | ICD-10-CM | POA: Diagnosis not present

## 2021-04-04 DIAGNOSIS — J90 Pleural effusion, not elsewhere classified: Secondary | ICD-10-CM | POA: Diagnosis not present

## 2021-04-04 DIAGNOSIS — E7800 Pure hypercholesterolemia, unspecified: Secondary | ICD-10-CM | POA: Diagnosis not present

## 2021-04-04 DIAGNOSIS — Z87891 Personal history of nicotine dependence: Secondary | ICD-10-CM | POA: Diagnosis not present

## 2021-04-04 DIAGNOSIS — Z951 Presence of aortocoronary bypass graft: Secondary | ICD-10-CM | POA: Diagnosis not present

## 2021-04-04 DIAGNOSIS — M1990 Unspecified osteoarthritis, unspecified site: Secondary | ICD-10-CM | POA: Diagnosis not present

## 2021-04-04 DIAGNOSIS — I5030 Unspecified diastolic (congestive) heart failure: Secondary | ICD-10-CM | POA: Diagnosis not present

## 2021-04-04 DIAGNOSIS — J439 Emphysema, unspecified: Secondary | ICD-10-CM | POA: Diagnosis not present

## 2021-04-04 DIAGNOSIS — I480 Paroxysmal atrial fibrillation: Secondary | ICD-10-CM | POA: Diagnosis not present

## 2021-04-04 DIAGNOSIS — F32A Depression, unspecified: Secondary | ICD-10-CM | POA: Diagnosis not present

## 2021-04-04 DIAGNOSIS — J701 Chronic and other pulmonary manifestations due to radiation: Secondary | ICD-10-CM | POA: Diagnosis not present

## 2021-04-04 DIAGNOSIS — Z7982 Long term (current) use of aspirin: Secondary | ICD-10-CM | POA: Diagnosis not present

## 2021-04-04 DIAGNOSIS — Z8673 Personal history of transient ischemic attack (TIA), and cerebral infarction without residual deficits: Secondary | ICD-10-CM | POA: Diagnosis not present

## 2021-04-08 DIAGNOSIS — I480 Paroxysmal atrial fibrillation: Secondary | ICD-10-CM | POA: Diagnosis not present

## 2021-04-08 DIAGNOSIS — Z87891 Personal history of nicotine dependence: Secondary | ICD-10-CM | POA: Diagnosis not present

## 2021-04-08 DIAGNOSIS — I252 Old myocardial infarction: Secondary | ICD-10-CM | POA: Diagnosis not present

## 2021-04-08 DIAGNOSIS — Z85118 Personal history of other malignant neoplasm of bronchus and lung: Secondary | ICD-10-CM | POA: Diagnosis not present

## 2021-04-08 DIAGNOSIS — I5030 Unspecified diastolic (congestive) heart failure: Secondary | ICD-10-CM | POA: Diagnosis not present

## 2021-04-08 DIAGNOSIS — K219 Gastro-esophageal reflux disease without esophagitis: Secondary | ICD-10-CM | POA: Diagnosis not present

## 2021-04-08 DIAGNOSIS — I110 Hypertensive heart disease with heart failure: Secondary | ICD-10-CM | POA: Diagnosis not present

## 2021-04-08 DIAGNOSIS — E7800 Pure hypercholesterolemia, unspecified: Secondary | ICD-10-CM | POA: Diagnosis not present

## 2021-04-08 DIAGNOSIS — Z9181 History of falling: Secondary | ICD-10-CM | POA: Diagnosis not present

## 2021-04-08 DIAGNOSIS — Z8673 Personal history of transient ischemic attack (TIA), and cerebral infarction without residual deficits: Secondary | ICD-10-CM | POA: Diagnosis not present

## 2021-04-08 DIAGNOSIS — J701 Chronic and other pulmonary manifestations due to radiation: Secondary | ICD-10-CM | POA: Diagnosis not present

## 2021-04-08 DIAGNOSIS — L89153 Pressure ulcer of sacral region, stage 3: Secondary | ICD-10-CM | POA: Diagnosis not present

## 2021-04-08 DIAGNOSIS — F32A Depression, unspecified: Secondary | ICD-10-CM | POA: Diagnosis not present

## 2021-04-08 DIAGNOSIS — I2510 Atherosclerotic heart disease of native coronary artery without angina pectoris: Secondary | ICD-10-CM | POA: Diagnosis not present

## 2021-04-08 DIAGNOSIS — J90 Pleural effusion, not elsewhere classified: Secondary | ICD-10-CM | POA: Diagnosis not present

## 2021-04-08 DIAGNOSIS — K5790 Diverticulosis of intestine, part unspecified, without perforation or abscess without bleeding: Secondary | ICD-10-CM | POA: Diagnosis not present

## 2021-04-08 DIAGNOSIS — D649 Anemia, unspecified: Secondary | ICD-10-CM | POA: Diagnosis not present

## 2021-04-08 DIAGNOSIS — Z951 Presence of aortocoronary bypass graft: Secondary | ICD-10-CM | POA: Diagnosis not present

## 2021-04-08 DIAGNOSIS — J9611 Chronic respiratory failure with hypoxia: Secondary | ICD-10-CM | POA: Diagnosis not present

## 2021-04-08 DIAGNOSIS — J439 Emphysema, unspecified: Secondary | ICD-10-CM | POA: Diagnosis not present

## 2021-04-08 DIAGNOSIS — Z7982 Long term (current) use of aspirin: Secondary | ICD-10-CM | POA: Diagnosis not present

## 2021-04-08 DIAGNOSIS — I700 Atherosclerosis of aorta: Secondary | ICD-10-CM | POA: Diagnosis not present

## 2021-04-08 DIAGNOSIS — M1990 Unspecified osteoarthritis, unspecified site: Secondary | ICD-10-CM | POA: Diagnosis not present

## 2021-04-09 ENCOUNTER — Encounter: Payer: Self-pay | Admitting: Oncology

## 2021-04-09 ENCOUNTER — Encounter: Payer: Self-pay | Admitting: Hematology and Oncology

## 2021-04-16 DIAGNOSIS — J9601 Acute respiratory failure with hypoxia: Secondary | ICD-10-CM | POA: Diagnosis not present

## 2021-04-18 DIAGNOSIS — Z87891 Personal history of nicotine dependence: Secondary | ICD-10-CM | POA: Diagnosis not present

## 2021-04-18 DIAGNOSIS — J9611 Chronic respiratory failure with hypoxia: Secondary | ICD-10-CM | POA: Diagnosis not present

## 2021-04-18 DIAGNOSIS — I110 Hypertensive heart disease with heart failure: Secondary | ICD-10-CM | POA: Diagnosis not present

## 2021-04-18 DIAGNOSIS — J90 Pleural effusion, not elsewhere classified: Secondary | ICD-10-CM | POA: Diagnosis not present

## 2021-04-18 DIAGNOSIS — E7800 Pure hypercholesterolemia, unspecified: Secondary | ICD-10-CM | POA: Diagnosis not present

## 2021-04-18 DIAGNOSIS — I2510 Atherosclerotic heart disease of native coronary artery without angina pectoris: Secondary | ICD-10-CM | POA: Diagnosis not present

## 2021-04-18 DIAGNOSIS — F32A Depression, unspecified: Secondary | ICD-10-CM | POA: Diagnosis not present

## 2021-04-18 DIAGNOSIS — D649 Anemia, unspecified: Secondary | ICD-10-CM | POA: Diagnosis not present

## 2021-04-18 DIAGNOSIS — Z85118 Personal history of other malignant neoplasm of bronchus and lung: Secondary | ICD-10-CM | POA: Diagnosis not present

## 2021-04-18 DIAGNOSIS — I700 Atherosclerosis of aorta: Secondary | ICD-10-CM | POA: Diagnosis not present

## 2021-04-18 DIAGNOSIS — J701 Chronic and other pulmonary manifestations due to radiation: Secondary | ICD-10-CM | POA: Diagnosis not present

## 2021-04-18 DIAGNOSIS — Z9181 History of falling: Secondary | ICD-10-CM | POA: Diagnosis not present

## 2021-04-18 DIAGNOSIS — Z951 Presence of aortocoronary bypass graft: Secondary | ICD-10-CM | POA: Diagnosis not present

## 2021-04-18 DIAGNOSIS — I5030 Unspecified diastolic (congestive) heart failure: Secondary | ICD-10-CM | POA: Diagnosis not present

## 2021-04-18 DIAGNOSIS — M1990 Unspecified osteoarthritis, unspecified site: Secondary | ICD-10-CM | POA: Diagnosis not present

## 2021-04-18 DIAGNOSIS — Z8673 Personal history of transient ischemic attack (TIA), and cerebral infarction without residual deficits: Secondary | ICD-10-CM | POA: Diagnosis not present

## 2021-04-18 DIAGNOSIS — L89153 Pressure ulcer of sacral region, stage 3: Secondary | ICD-10-CM | POA: Diagnosis not present

## 2021-04-18 DIAGNOSIS — I480 Paroxysmal atrial fibrillation: Secondary | ICD-10-CM | POA: Diagnosis not present

## 2021-04-18 DIAGNOSIS — Z7982 Long term (current) use of aspirin: Secondary | ICD-10-CM | POA: Diagnosis not present

## 2021-04-18 DIAGNOSIS — K5790 Diverticulosis of intestine, part unspecified, without perforation or abscess without bleeding: Secondary | ICD-10-CM | POA: Diagnosis not present

## 2021-04-18 DIAGNOSIS — I252 Old myocardial infarction: Secondary | ICD-10-CM | POA: Diagnosis not present

## 2021-04-18 DIAGNOSIS — K219 Gastro-esophageal reflux disease without esophagitis: Secondary | ICD-10-CM | POA: Diagnosis not present

## 2021-04-18 DIAGNOSIS — J439 Emphysema, unspecified: Secondary | ICD-10-CM | POA: Diagnosis not present

## 2021-04-24 ENCOUNTER — Other Ambulatory Visit: Payer: Self-pay | Admitting: Oncology

## 2021-04-24 DIAGNOSIS — G4700 Insomnia, unspecified: Secondary | ICD-10-CM

## 2021-04-25 ENCOUNTER — Inpatient Hospital Stay: Payer: Medicare Other

## 2021-04-25 ENCOUNTER — Inpatient Hospital Stay: Payer: Medicare Other | Attending: Internal Medicine | Admitting: Hematology and Oncology

## 2021-04-25 VITALS — BP 111/57 | HR 140 | Temp 99.1°F | Resp 21 | Ht 59.0 in

## 2021-04-25 DIAGNOSIS — Z79899 Other long term (current) drug therapy: Secondary | ICD-10-CM | POA: Insufficient documentation

## 2021-04-25 DIAGNOSIS — D539 Nutritional anemia, unspecified: Secondary | ICD-10-CM

## 2021-04-25 DIAGNOSIS — C3431 Malignant neoplasm of lower lobe, right bronchus or lung: Secondary | ICD-10-CM | POA: Insufficient documentation

## 2021-04-25 DIAGNOSIS — D649 Anemia, unspecified: Secondary | ICD-10-CM | POA: Diagnosis not present

## 2021-04-25 DIAGNOSIS — Z9221 Personal history of antineoplastic chemotherapy: Secondary | ICD-10-CM | POA: Diagnosis not present

## 2021-04-25 LAB — FOLATE: Folate: 35.5 ng/mL (ref >5.9)

## 2021-04-25 LAB — HEPATIC FUNCTION PANEL
ALT: 20 U/L (ref 7–35)
AST: 25 (ref 13–35)
Alkaline Phosphatase: 80 (ref 25–125)
Bilirubin, Total: 0.7

## 2021-04-25 LAB — CBC AND DIFFERENTIAL
HCT: 33 — AB (ref 36–46)
Hemoglobin: 10.5 — AB (ref 12.0–16.0)
Neutrophils Absolute: 4.41
Platelets: 190 K/uL (ref 150–400)
WBC: 5.8

## 2021-04-25 LAB — VITAMIN B12: Vitamin B-12: 353 pg/mL (ref 180–914)

## 2021-04-25 LAB — IRON AND TIBC
Iron: 53 ug/dL (ref 28–170)
Saturation Ratios: 13 % (ref 10.4–31.8)
TIBC: 422 ug/dL (ref 250–450)
UIBC: 369 ug/dL

## 2021-04-25 LAB — BASIC METABOLIC PANEL
BUN: 16 (ref 4–21)
CO2: 19 (ref 13–22)
Chloride: 113 — AB (ref 99–108)
Creatinine: 0.8 (ref 0.5–1.1)
Glucose: 132
Potassium: 3.5 mEq/L (ref 3.5–5.1)
Sodium: 144 (ref 137–147)

## 2021-04-25 LAB — COMPREHENSIVE METABOLIC PANEL
Albumin: 4 (ref 3.5–5.0)
Calcium: 9.3 (ref 8.7–10.7)

## 2021-04-25 LAB — CBC: RBC: 3.62 — AB (ref 3.87–5.11)

## 2021-04-25 LAB — TSH: TSH: 1.435 u[IU]/mL (ref 0.350–4.500)

## 2021-04-25 LAB — FERRITIN: Ferritin: 42 ng/mL (ref 11–307)

## 2021-04-26 ENCOUNTER — Encounter: Payer: Self-pay | Admitting: Hematology and Oncology

## 2021-04-26 ENCOUNTER — Encounter: Payer: Self-pay | Admitting: Oncology

## 2021-04-26 DIAGNOSIS — R0789 Other chest pain: Secondary | ICD-10-CM | POA: Diagnosis not present

## 2021-04-26 LAB — T4: T4, Total: 6.9 ug/dL (ref 4.5–12.0)

## 2021-04-26 NOTE — Progress Notes (Signed)
?Patient Care Team: ?Lowella Dandy, NP as PCP - General (Internal Medicine) ?Constance Haw, MD as PCP - Electrophysiology (Cardiology) ?Freada Bergeron, MD as PCP - Cardiology (Cardiology) ?Derwood Kaplan, MD as PCP - Hematology/Oncology (Oncology) ?Lowella Dandy, NP as Nurse Practitioner (Internal Medicine) ? ?Clinic Day:  04/26/2021 ? ?Referring physician: Lowella Dandy, NP ? ?ASSESSMENT & PLAN:  ? ?Assessment & Plan: ?Malignant neoplasm of bronchus of right lower lobe (HCC) ?Stage IIIB (T1b N3 M0) squamous cell lung cancer, diagnosed in June 2022.  She received concurrent chemoradiation and completed 6 cycles of carboplatin/Abraxane.  She completed radiation therapy on September 6th, and CT imaging has shown a good response. She started maintenance immunotherapy with durvalumab for 1 year on October 11th and only received 2 cycles. Her last dose of immunotherapy was on October 27th. CT imaging from February 2023 revealed no evidence of progressive disease. She unfortunately just lost her husband on 03/26 unexpectedly and is very emotional in clinic today. Her daughter is staying with her now and she is coping. She is requiring oxygen 24 hours now and her PCP is working on ordering her supplies. We will plan for CT imaging prior to next visit in 4 weeks.   ? ?The patient understands the plans discussed today and is in agreement with them.  She knows to contact our office if she develops concerns prior to her next appointment. ? ? ? ?Melodye Ped, NP  ?[place] ?[address] ?[place] [address] ?Dept: [phone] ?Dept Fax: [phone]  ? ?Orders Placed This Encounter  ?Procedures  ? CT Chest W Contrast  ?  Standing Status:   Future  ?  Standing Expiration Date:   04/25/2022  ?  Order Specific Question:   If indicated for the ordered procedure, I authorize the administration of contrast media per Radiology protocol  ?  Answer:    Yes  ?  Order Specific Question:   Preferred imaging location?  ?  Answer:   External  ?  ? ? ?CHIEF COMPLAINT:  ?CC: An 81 year old female with history of lung cancer here for 4 week evaluation ? ?Current Treatment:  Surveillance ? ?INTERVAL HISTORY:  ?[name] is here today for repeat clinical assessment. She denies fevers or chills. She denies pain. Her appetite is good. Her weight has been stable. ? ?I have reviewed the past medical history, past surgical history, social history and family history with the patient and they are unchanged from previous note. ? ?ALLERGIES:  is allergic to pregabalin, paclitaxel, and sulfonamide derivatives. ? ?MEDICATIONS:  ?Current Outpatient Medications  ?Medication Sig Dispense Refill  ? aspirin EC 81 MG tablet Take 2 tablets (162 mg total) by mouth in the morning. Okay to restart this medication on 07/04/2020 30 tablet 11  ? atorvastatin (LIPITOR) 80 MG tablet Take 80 mg by mouth every evening.     ? budesonide (PULMICORT) 0.5 MG/2ML nebulizer solution Take by nebulization.    ? buPROPion (WELLBUTRIN XL) 150 MG 24 hr tablet Take 1 tablet (150 mg total) by mouth daily. 90 tablet 3  ? fish oil-omega-3 fatty acids 1000 MG capsule Take 1,000 mg by mouth in the morning.    ? fluticasone (FLONASE) 50 MCG/ACT nasal spray Place 1 spray into both nostrils daily as needed for allergies.    ? folic acid (FOLVITE) 947 MCG tablet Take 800 mcg by mouth in the morning.    ?  ipratropium-albuterol (DUONEB) 0.5-2.5 (3) MG/3ML SOLN USE 1 VIAL VIA NEBULIZER EVERY 6 HOURS AS NEEDED 360 mL 11  ? metoprolol succinate (TOPROL-XL) 25 MG 24 hr tablet Take by mouth.    ? mirtazapine (REMERON) 45 MG tablet Take 45 mg by mouth at bedtime.     ? nitroGLYCERIN (NITROSTAT) 0.4 MG SL tablet DISSOLVE 1 TABLET UNDER THE TONGUE EVERY 5 MINUTES AS  NEEDED FOR CHEST PAIN. MAX  OF 3 TABLETS IN 15 MINUTES. CALL 911 IF PAIN PERSISTS. 100 tablet 3  ? ondansetron (ZOFRAN) 4 MG tablet Take 4 mg by mouth every 8 (eight)  hours as needed for nausea or vomiting.    ? pantoprazole (PROTONIX) 40 MG tablet Take 40 mg by mouth in the morning.    ? potassium chloride SA (KLOR-CON M) 20 MEQ tablet Take 20 mEq by mouth daily.    ? sertraline (ZOLOFT) 100 MG tablet Take 200 mg by mouth in the morning.    ? zolpidem (AMBIEN) 5 MG tablet TAKE 1 TABLET BY MOUTH ONCE DAILY AT BEDTIME AS NEEDED FOR SLEEP. 30 tablet 0  ? ?No current facility-administered medications for this visit.  ? ? ?HISTORY OF PRESENT ILLNESS:  ? ?Oncology History  ?Malignant neoplasm of bronchus of right lower lobe ([place])  ?07/10/2020 Initial Diagnosis  ? Stage III squamous cell carcinoma of right lung ([place]) ?  ?07/10/2020 Cancer Staging  ? Staging form: Lung, AJCC 8th Edition ?- Clinical: Stage IIIB (cT1b, cN3, cM0) - Signed by Curt Bears, MD on 07/10/2020 ? ?  ?07/30/2020 - 09/17/2020 Chemotherapy  ?  ? ?  ? ?  ?10/25/2020 Imaging  ? CT chest: ?1. Interval decrease in size of the right lower lobe lung lesion.  ?2. Progressive airspace opacity and atelectasis in the left upper  ?lobe.  ?3. Persistent small left effusion and left lower lobe atelectasis or  ?infiltrate.  ?4. New 15.5 mm sub solid nodular lesion in the right upper lobe just above the minor fissure. It is possible this is an area of  ?inflammation but attention on future studies is suggested. Recommend follow-up noncontrast chest CT in 3-4 months.  ?5. Stable emphysematous changes and pulmonary scarring.  ?6. Slightly smaller borderline enlarged mediastinal and hilar lymph  ?nodes.  ?7. Stable atherosclerotic calcifications involving the aorta and  ?branch vessels including three-vessel coronary artery calcifications.  ?  ?10/30/2020 -  Chemotherapy  ? Patient is on Treatment Plan : LUNG Durvalumab q14d  ?   ?  ? ? ?REVIEW OF SYSTEMS:  ? ?Constitutional: Denies fevers, chills or abnormal weight loss ?Eyes: Denies blurriness of vision ?Ears, nose, mouth, throat, and face: Denies mucositis or sore  throat ?Respiratory: Denies cough, dyspnea or wheezes ?Cardiovascular: Denies palpitation, chest discomfort or lower extremity swelling ?Gastrointestinal:  Denies nausea, heartburn or change in bowel habits ?Skin: Denies abnormal skin rashes ?Lymphatics: Denies new lymphadenopathy or easy bruising ?Neurological:Denies numbness, tingling or new weaknesses ?Behavioral/Psych: Mood is stable, no new changes  ?All other systems were reviewed with the patient and are negative. ? ? ?VITALS:  ?Blood pressure (!) 111/57, pulse (!) 140, temperature 99.1 ?F (37.3 ?C), temperature source Oral, resp. rate (!) 21, height 4\' 11"  (1.499 m), SpO2 95 %.  ?Wt Readings from Last 3 Encounters:  ?03/25/21 132 lb (59.9 kg)  ?03/13/21 130 lb 9.6 oz (59.2 kg)  ?02/07/21 129 lb 9.6 oz (58.8 kg)  ?  ?Body mass index is 26.66 kg/m?. ? ?Performance status (ECOG): 1 - Symptomatic but  completely ambulatory ? ?PHYSICAL EXAM:  ? ?GENERAL:alert, no distress and comfortable ?SKIN: skin color, texture, turgor are normal, no rashes or significant lesions ?EYES: normal, Conjunctiva are pink and non-injected, sclera clear ?OROPHARYNX:no exudate, no erythema and lips, buccal mucosa, and tongue normal  ?NECK: supple, thyroid normal size, non-tender, without nodularity ?LYMPH:  no palpable lymphadenopathy in the cervical, axillary or inguinal ?LUNGS: clear to auscultation and percussion with normal breathing effort ?HEART: regular rate & rhythm and no murmurs and no lower extremity edema ?ABDOMEN:abdomen soft, non-tender and normal bowel sounds ?Musculoskeletal:no cyanosis of digits and no clubbing  ?NEURO: alert & oriented x 3 with fluent speech, no focal motor/sensory deficits ? ?LABORATORY DATA:  ?I have reviewed the data as listed ?   ?Component Value Date/Time  ? NA 144 04/25/2021 0000  ? K 3.5 04/25/2021 0000  ? CL 113 (A) 04/25/2021 0000  ? CO2 19 04/25/2021 0000  ? GLUCOSE 166 (H) 09/17/2020 1013  ? BUN 16 04/25/2021 0000  ? CREATININE 0.8  04/25/2021 0000  ? CREATININE 0.77 09/17/2020 1013  ? CALCIUM 9.3 04/25/2021 0000  ? PROT 6.1 (L) 09/17/2020 1013  ? PROT 6.5 01/25/2019 1036  ? ALBUMIN 4.0 04/25/2021 0000  ? ALBUMIN 4.3 01/25/2019 1036  ? AST 25 04/25/2021 00

## 2021-04-26 NOTE — Assessment & Plan Note (Signed)
Stage IIIB (T1b N3 M0) squamous cell lung cancer, diagnosed in June 2022. ?She received concurrent chemoradiation and completed 6 cycles of carboplatin/Abraxane. ?She completed?radiation therapy on September 6th, and CT imaging has shown a good response.?She started?maintenance immunotherapy with durvalumab for 1 year on October 11th and only received 2 cycles. Her last dose of immunotherapy was on October 27th. CT imaging from February 2023 revealed no evidence of progressive disease. She unfortunately just lost her husband on 03/26 unexpectedly and is very emotional in clinic today. Her daughter is staying with her now and she is coping. She is requiring oxygen 24 hours now and her PCP is working on ordering her supplies. We will plan for CT imaging prior to next visit in 4 weeks.  ?

## 2021-05-02 DIAGNOSIS — L89153 Pressure ulcer of sacral region, stage 3: Secondary | ICD-10-CM | POA: Diagnosis not present

## 2021-05-02 DIAGNOSIS — D539 Nutritional anemia, unspecified: Secondary | ICD-10-CM | POA: Diagnosis not present

## 2021-05-02 DIAGNOSIS — E785 Hyperlipidemia, unspecified: Secondary | ICD-10-CM | POA: Diagnosis not present

## 2021-05-02 DIAGNOSIS — G4700 Insomnia, unspecified: Secondary | ICD-10-CM | POA: Diagnosis not present

## 2021-05-02 DIAGNOSIS — I471 Supraventricular tachycardia: Secondary | ICD-10-CM | POA: Diagnosis not present

## 2021-05-02 DIAGNOSIS — C3491 Malignant neoplasm of unspecified part of right bronchus or lung: Secondary | ICD-10-CM | POA: Diagnosis not present

## 2021-05-02 DIAGNOSIS — M1990 Unspecified osteoarthritis, unspecified site: Secondary | ICD-10-CM | POA: Diagnosis not present

## 2021-05-02 DIAGNOSIS — J9611 Chronic respiratory failure with hypoxia: Secondary | ICD-10-CM | POA: Diagnosis not present

## 2021-05-02 DIAGNOSIS — J449 Chronic obstructive pulmonary disease, unspecified: Secondary | ICD-10-CM | POA: Diagnosis not present

## 2021-05-02 DIAGNOSIS — I10 Essential (primary) hypertension: Secondary | ICD-10-CM | POA: Diagnosis not present

## 2021-05-07 ENCOUNTER — Encounter: Payer: Self-pay | Admitting: Oncology

## 2021-05-07 DIAGNOSIS — R0789 Other chest pain: Secondary | ICD-10-CM | POA: Diagnosis not present

## 2021-05-09 ENCOUNTER — Encounter: Payer: Self-pay | Admitting: Oncology

## 2021-05-20 ENCOUNTER — Other Ambulatory Visit: Payer: Self-pay | Admitting: Oncology

## 2021-05-20 DIAGNOSIS — J90 Pleural effusion, not elsewhere classified: Secondary | ICD-10-CM | POA: Diagnosis not present

## 2021-05-20 DIAGNOSIS — J439 Emphysema, unspecified: Secondary | ICD-10-CM | POA: Diagnosis not present

## 2021-05-20 DIAGNOSIS — J449 Chronic obstructive pulmonary disease, unspecified: Secondary | ICD-10-CM | POA: Diagnosis not present

## 2021-05-20 DIAGNOSIS — G4700 Insomnia, unspecified: Secondary | ICD-10-CM

## 2021-05-20 DIAGNOSIS — C3411 Malignant neoplasm of upper lobe, right bronchus or lung: Secondary | ICD-10-CM | POA: Diagnosis not present

## 2021-05-21 ENCOUNTER — Encounter: Payer: Self-pay | Admitting: Hematology and Oncology

## 2021-05-23 ENCOUNTER — Other Ambulatory Visit: Payer: Self-pay | Admitting: Hematology and Oncology

## 2021-05-23 ENCOUNTER — Inpatient Hospital Stay: Payer: Medicare Other | Attending: Internal Medicine | Admitting: Oncology

## 2021-05-23 ENCOUNTER — Other Ambulatory Visit: Payer: Self-pay

## 2021-05-23 ENCOUNTER — Inpatient Hospital Stay: Payer: Medicare Other

## 2021-05-23 ENCOUNTER — Other Ambulatory Visit: Payer: Self-pay | Admitting: Oncology

## 2021-05-23 ENCOUNTER — Telehealth: Payer: Self-pay | Admitting: Oncology

## 2021-05-23 VITALS — BP 145/65 | HR 76 | Temp 98.4°F | Resp 21 | Ht 59.0 in | Wt 132.8 lb

## 2021-05-23 DIAGNOSIS — C3431 Malignant neoplasm of lower lobe, right bronchus or lung: Secondary | ICD-10-CM | POA: Diagnosis not present

## 2021-05-23 DIAGNOSIS — D649 Anemia, unspecified: Secondary | ICD-10-CM | POA: Diagnosis not present

## 2021-05-23 DIAGNOSIS — D539 Nutritional anemia, unspecified: Secondary | ICD-10-CM

## 2021-05-23 DIAGNOSIS — E876 Hypokalemia: Secondary | ICD-10-CM

## 2021-05-23 LAB — HEPATIC FUNCTION PANEL
ALT: 19 U/L (ref 7–35)
AST: 26 (ref 13–35)
Alkaline Phosphatase: 77 (ref 25–125)
Bilirubin, Total: 0.6

## 2021-05-23 LAB — BASIC METABOLIC PANEL
BUN: 20 (ref 4–21)
CO2: 22 (ref 13–22)
Chloride: 109 — AB (ref 99–108)
Creatinine: 0.9 (ref 0.5–1.1)
Glucose: 154
Potassium: 3.3 mEq/L — AB (ref 3.5–5.1)
Sodium: 144 (ref 137–147)

## 2021-05-23 LAB — COMPREHENSIVE METABOLIC PANEL
Albumin: 3.9 (ref 3.5–5.0)
Calcium: 9.2 (ref 8.7–10.7)

## 2021-05-23 LAB — CBC
MCV: 91 (ref 81–99)
RBC: 3.6 — AB (ref 3.87–5.11)

## 2021-05-23 LAB — CBC AND DIFFERENTIAL
HCT: 33 — AB (ref 36–46)
Hemoglobin: 10.5 — AB (ref 12.0–16.0)
Neutrophils Absolute: 3.55
Platelets: 177 10*3/uL (ref 150–400)
WBC: 5.3

## 2021-05-23 MED ORDER — POTASSIUM CHLORIDE CRYS ER 20 MEQ PO TBCR: 20.0000 meq | EXTENDED_RELEASE_TABLET | Freq: Two times a day (BID) | ORAL | 5 refills | Status: DC

## 2021-05-23 NOTE — Progress Notes (Signed)
[facility]  [address] [place],  [place]  [address] [phone]  Clinic Day:  05/23/21  Referring physician: Lowella Dandy, NP  ASSESSMENT & PLAN:   Assessment & Plan:  Stage IIIB (T1b N3 M0) squamous cell lung cancer, diagnosed in June 2022.  She received concurrent chemoradiation and completed 6 cycles of carboplatin/Abraxane.  I do not feel she needs further chemotherapy at this time.  She completed radiation therapy on September 6th, and CT imaging has shown a good response. She started maintenance immunotherapy with durvalumab for 1 year on October 11th and only received 2 cycles. Her last dose of immunotherapy was on October 27th. CT imaging from February 2023 revealed no evidence of progressive disease.   2.  COVID infection, August 2022, requiring hospitalization for pneumonia from November 8th to December 1st. She is slowly returning back to baseline.   3.  Osteopenia.  She is up to date on bone density scans as of April 2022, which are scheduled through Dr. Delena Bali office.  4.  Anemia, consistent with iron deficiency.  I recommend she try ferrous sulfate 1 daily and we will monitor this.  5.  Hypokalemia.  This is mild but she is not taking her potassium supplement and so I encouraged her to get back on 20 mEq twice daily..    She remains weak, fatigued and short of breath with exertion.  She continues on surveillance only.  She may continue supplemental oxygen as needed. We will plan to see her back in 3 months with CBC, CMP and CEA for repeat evaluation. In 6 months we will plan to repeat CT chest. She understands and agrees with this plan of care. I have answered her questions and she knows to call with any concerns.   I provided 30 minutes of face-to-face time during this this encounter and > 50% was spent counseling as documented under my assessment and plan.     Derwood Kaplan, MD  [facility] [address] [place] [address] Dept: [phone] Dept Fax: [phone]       CHIEF COMPLAINT:  CC: Stage IIIB squamous cell lung cancer  Current Treatment: Surveillance   HISTORY OF PRESENT ILLNESS:  Stage IIIB (T1b, N3, M0) squamous cell lung cancer diagnosed in June 2022.  This began when the patient started to experience intermittent chest pain and shortness of breath, and followed up with cardiology for further evaluation.  Chest x-ray was abnormal.  Due to her history of COPD and long history of smoking she was referred to pulmonology.  CT chest from May revealed a new highly suspicious spiculated pulmonary nodule within the superior lateral aspect of the right lower lobe measuring 1.5 x 1.0 x 1.4 cm with central small cavitation highly suspicious to be a neoplastic nodule.  There was also nodularity along the upper wall of the left mainstem bronchus of uncertain etiology but also suspicious for additional neoplastic process.  CT super D of the chest from June revealed enlargement of the thick-walled spiculated cavitary nodule in the superior segment of the right lower lobe measuring 1.6 x 1.6.  There was also numerous mediastinal lymph nodes some of which are enlarged including high AP window lymph node measuring 1.2 cm, lower right paratracheal lymph node measuring 1.4 cm. Final pathology confirmed malignant cells consistent with non-small cell carcinoma. Overall, the morphology and immunophenotype favor a squamous cell carcinoma.  Staging PET  scan showed the hypermetabolic right lower lobe lung lesion consistent with primary lung neoplasm.  There was also mediastinal and hilar lymphadenopathy but no findings to suggest abdominal/pelvic metastatic disease or osseous metastatic disease for a T1b N3 M0, stage IIIB.  MRI brain was negative for metastasis.  She was referred to Dr. Julien Nordmann, oncology, and was started concurrent chemoradiation with weekly  carboplatin/paclitaxel.  Her 1st cycle was July 11th.  With the 3rd cycle, she developed a reaction to paclitaxel and so was switched to Abraxane.  She has received 6 cycles of therapy.    She has a medical history significant for coronary artery disease status post stent placement, carotid artery disease, colon obstruction status post surgical intervention, depression, COPD, DVT, GERD, intracranial hemorrhage, hypertension, strokes, anemia and Barrett's esophagus. [name] states that she has quit smoking since her diagnosis in June after 60 pack years.  She contracted COVID back in August and was quite ill and required hospitalization for 5 days.  She has had hypokalemia and supplement was ordered.   Oncology History  Malignant neoplasm of bronchus of right lower lobe ([facility])  07/10/2020 Initial Diagnosis   Stage III squamous cell carcinoma of right lung ([facility])    07/10/2020 Cancer Staging   Staging form: Lung, AJCC 8th Edition - Clinical: Stage IIIB (cT1b, cN3, cM0) - Signed by Curt Bears, MD on 07/10/2020    07/30/2020 - 09/17/2020 Chemotherapy          10/25/2020 Imaging   CT chest: 1. Interval decrease in size of the right lower lobe lung lesion.  2. Progressive airspace opacity and atelectasis in the left upper  lobe.  3. Persistent small left effusion and left lower lobe atelectasis or  infiltrate.  4. New 15.5 mm sub solid nodular lesion in the right upper lobe just above the minor fissure. It is possible this is an area of  inflammation but attention on future studies is suggested. Recommend follow-up noncontrast chest CT in 3-4 months.  5. Stable emphysematous changes and pulmonary scarring.  6. Slightly smaller borderline enlarged mediastinal and hilar lymph  nodes.  7. Stable atherosclerotic calcifications involving the aorta and  branch vessels including three-vessel coronary artery calcifications.    10/30/2020 -  Chemotherapy   Patient is on Treatment Plan : LUNG  Durvalumab q14d         INTERVAL HISTORY:  Brandalynn is here for follow up after being widowed earlier this year.  She is understandably depressed and feels that she is going through this alone now.  She notes fatigue, generalized weakness and shortness of breath with exertion.  She has had insomnia, which has improved with Ambien.  She does admit to depression as she is still grieving, and has not been able to continue her normal activities. She continues Wellbutrin 150 mg daily as well as Zoloft 200 mg daily. Hemoglobin is stable at 10.5, and white count and platelets are normal. Chemistries are unremarkable except for a low potassium of 3.3 and a nonfasting blood sugar of 154.Marland Kitchen Her  appetite is fair, and her weight is stable since her last visit.  She denies fever, chills or other signs of infection.  She denies nausea, vomiting, bowel issues, or abdominal pain.  She denies sore throat, cough, dyspnea, or chest pain.  REVIEW OF SYSTEMS:  Review of Systems  Constitutional:  Positive for fatigue. Negative for appetite change, chills, fever and unexpected weight change.       Generalized weakness, moderate  HENT:  Negative.  Eyes: Negative.   Respiratory:  Negative for chest tightness, cough, hemoptysis and wheezing. Shortness of breath: with minimal exertion, on supplemental oxygen.  Cardiovascular: Negative.  Negative for chest pain, leg swelling and palpitations.  Gastrointestinal: Negative.  Negative for abdominal distention, abdominal pain, blood in stool, constipation, diarrhea, nausea and vomiting.  Endocrine: Negative.   Genitourinary: Negative.  Negative for difficulty urinating, dysuria, frequency and hematuria.   Musculoskeletal:  Negative for arthralgias, back pain, flank pain, gait problem and myalgias.  Skin:  Negative for wound.  Neurological:  Negative for dizziness, extremity weakness (using a wheelchair), gait problem, headaches, light-headedness, numbness, seizures and speech  difficulty.  Hematological: Negative.   Psychiatric/Behavioral:  Sleep disturbance: insomnia. The patient is not nervous/anxious.     VITALS:  Blood pressure (!) 145/65, pulse 76, temperature 98.4 F (36.9 C), temperature source Oral, resp. rate (!) 21, height 4\' 11"  (1.499 m), weight 132 lb 12.8 oz (60.2 kg), SpO2 94 %.  Wt Readings from Last 3 Encounters:  05/23/21 132 lb 12.8 oz (60.2 kg)  03/25/21 132 lb (59.9 kg)  03/13/21 130 lb 9.6 oz (59.2 kg)    Body mass index is 26.82 kg/m.  Performance status (ECOG): 1 - Symptomatic but completely ambulatory  PHYSICAL EXAM:  Physical Exam Constitutional:      General: She is not in acute distress.    Appearance: Normal appearance. She is normal weight.  HENT:     Head: Normocephalic and atraumatic.  Eyes:     General: No scleral icterus.    Extraocular Movements: Extraocular movements intact.     Conjunctiva/sclera: Conjunctivae normal.     Pupils: Pupils are equal, round, and reactive to light.  Cardiovascular:     Rate and Rhythm: Normal rate and regular rhythm.     Pulses: Normal pulses.     Heart sounds: Normal heart sounds. No murmur heard.   No friction rub. No gallop.  Pulmonary:     Effort: Pulmonary effort is normal. No respiratory distress.     Breath sounds: Decreased breath sounds (of the left base) and rhonchi (course on inspiration in all lung fields) present.  Abdominal:     General: Bowel sounds are normal. There is no distension.     Palpations: Abdomen is soft. There is no hepatomegaly, splenomegaly or mass.     Tenderness: There is no abdominal tenderness.  Musculoskeletal:        General: Normal range of motion.     Cervical back: Normal range of motion and neck supple.     Right lower leg: No edema.     Left lower leg: No edema.  Lymphadenopathy:     Cervical: No cervical adenopathy.  Skin:    General: Skin is warm and dry.  Neurological:     General: No focal deficit present.     Mental Status: She  is alert and oriented to person, place, and time. Mental status is at baseline.  Psychiatric:        Mood and Affect: Mood normal.        Behavior: Behavior normal.        Thought Content: Thought content normal.        Judgment: Judgment normal.    LABS:      Latest Ref Rng & Units 05/23/2021   12:00 AM 04/25/2021   12:00 AM 03/25/2021   12:00 AM  CBC  WBC  5.3      5.8      5.3  Hemoglobin 12.0 - 16.0 10.5      10.5      10.4    Hematocrit 36 - 46 33      33      33    Platelets 150 - 400 K/uL 177      190      181       This result is from an external source.      Latest Ref Rng & Units 05/23/2021   12:00 AM 04/25/2021   12:00 AM 03/25/2021   12:00 AM  CMP  BUN 4 - 21 20      16      17     Creatinine 0.5 - 1.1 0.9      0.8      0.7    Sodium 137 - 147 144      144      141    Potassium 3.5 - 5.1 mEq/L 3.3      3.5      3.9    Chloride 99 - 108 109      113      113    CO2 13 - 22 22      19      23     Calcium 8.7 - 10.7 9.2      9.3      9.4    Alkaline Phos 25 - 125 77      80      75    AST 13 - 35 26      25      20     ALT 7 - 35 U/L 19      20      18        This result is from an external source.     Lab Results  Component Value Date   CEA1 7.1 02/26/2021   /  CEA  Date Value Ref Range Status  02/26/2021 7.1  Final    Lab Results  Component Value Date   TIBC 422 04/25/2021   TIBC 417 11/13/2020   FERRITIN 42 04/25/2021   FERRITIN 29 11/13/2020   IRONPCTSAT 13 04/25/2021   IRONPCTSAT 22 11/13/2020   IRONPCTSAT 7.7 (L) 06/19/2020   Lab Results  Component Value Date   LDH 166 06/19/2020    STUDIES:    EXAM: 05/20/21 CT CHEST WITH CONTRAST  TECHNIQUE:  Multidetector CT imaging of the chest was performed during  intravenous contrast administration.  RADIATION DOSE REDUCTION: This exam was performed according to the  departmental dose-optimization program which includes automated  exposure control, adjustment of the mA and/or kV according to  patient  size and/or use of iterative reconstruction technique.  CONTRAST: 60 cc Isovue 370  COMPARISON: Direct comparison 03/18/2021 diagnostic chest CT.  Secondary comparison to the interval 02/28/2021 CTA chest   FINDINGS:  Cardiovascular: Right Port-A-Cath tip high right atrium. Aortic  atherosclerosis. Mild cardiomegaly. Left main and 3 vessel coronary  artery calcification. Trace pericardial fluid is likely physiologic.  Pulmonary artery enlargement, outflow tract 3.2 cm. No central  pulmonary embolism, on this non-dedicated study.  Mediastinum/Nodes: No supraclavicular adenopathy. 9 mm pretracheal  node on 39/2 measures 11 mm on the prior. No hilar adenopathy. Tiny  hiatal hernia. Small prevascular nodes are unchanged.  Lungs/Pleura: Small, right larger than left pleural effusions.  Slightly decreased compared to 02/26/2021.  Moderate centrilobular emphysema.  Again identified is radiation induced consolidation within the  superior  segment right lower lobe, with adjacent fiducials. This  presumably obscures the known right lower lobe neoplasm.  Significantly improved aeration throughout the remainder of the  lungs. The only remaining area of mild airspace disease with  suggestion of architectural distortion is seen within the left upper  lobe including on 46/301.  Upper Abdomen: Normal imaged portions of the liver, spleen, adrenal  glands, kidneys.  Musculoskeletal: Lower anterior right rib remote fractures.   IMPRESSION:  1. Redemonstration of radiation induced consolidation within the  superior segment right lower lobe. No specific findings of  recurrent/metastatic disease.  2. Since 02/26/2021, significantly improved aeration, most  consistent with resolved infection/inflammation. Favor developing  post infectious/inflammatory scarring within the left upper lobe.  3. Small mediastinal nodes, including decreased size of a dominant  node, nonspecific.  4. Decreased small bilateral  pleural effusions.  5. Aortic atherosclerosis (ICD10-I70.0), coronary artery  atherosclerosis and emphysema (ICD10-J43.9).  6. Tiny hiatal hernia.  7. Pulmonary artery enlargement suggests pulmonary arterial  hypertension.   Electronically Signed  By: Abigail Miyamoto M.D.  On: 05/21/2021 08:55    HISTORY:   Allergies:  Allergies  Allergen Reactions   Pregabalin Swelling    Tongue swelling   Paclitaxel    Sulfonamide Derivatives Swelling    Childhood reaction    Current Medications: Current Outpatient Medications  Medication Sig Dispense Refill   aspirin EC 81 MG tablet Take 2 tablets (162 mg total) by mouth in the morning. Okay to restart this medication on 07/04/2020 30 tablet 11   atorvastatin (LIPITOR) 80 MG tablet Take 80 mg by mouth every evening.      budesonide (PULMICORT) 0.5 MG/2ML nebulizer solution Take by nebulization.     buPROPion (WELLBUTRIN XL) 150 MG 24 hr tablet Take 1 tablet (150 mg total) by mouth daily. 90 tablet 3   fish oil-omega-3 fatty acids 1000 MG capsule Take 1,000 mg by mouth in the morning.     fluticasone (FLONASE) 50 MCG/ACT nasal spray Place 1 spray into both nostrils daily as needed for allergies.     folic acid (FOLVITE) 161 MCG tablet Take 800 mcg by mouth in the morning.     ipratropium-albuterol (DUONEB) 0.5-2.5 (3) MG/3ML SOLN USE 1 VIAL VIA NEBULIZER EVERY 6 HOURS AS NEEDED 360 mL 11   metoprolol succinate (TOPROL-XL) 25 MG 24 hr tablet Take by mouth.     mirtazapine (REMERON) 45 MG tablet Take 45 mg by mouth at bedtime.      nitroGLYCERIN (NITROSTAT) 0.4 MG SL tablet DISSOLVE 1 TABLET UNDER THE TONGUE EVERY 5 MINUTES AS  NEEDED FOR CHEST PAIN. MAX  OF 3 TABLETS IN 15 MINUTES. CALL 911 IF PAIN PERSISTS. 100 tablet 3   ondansetron (ZOFRAN) 4 MG tablet Take 4 mg by mouth every 8 (eight) hours as needed for nausea or vomiting.     pantoprazole (PROTONIX) 40 MG tablet Take 40 mg by mouth in the morning.     potassium chloride SA (KLOR-CON M) 20  MEQ tablet Take 1 tablet (20 mEq total) by mouth 2 (two) times daily. 60 tablet 5   sertraline (ZOLOFT) 100 MG tablet Take 200 mg by mouth in the morning.     zolpidem (AMBIEN) 5 MG tablet TAKE 1 TABLET BY MOUTH ONCE DAILY AT BEDTIME AS NEEDED FOR SLEEP. 30 tablet 0   No current facility-administered medications for this visit.

## 2021-05-23 NOTE — Telephone Encounter (Signed)
Per 05/23/21 los next appt scheduled and confirmed with patient. ?

## 2021-05-24 ENCOUNTER — Encounter: Payer: Self-pay | Admitting: Oncology

## 2021-05-26 DIAGNOSIS — R0789 Other chest pain: Secondary | ICD-10-CM | POA: Diagnosis not present

## 2021-06-06 DIAGNOSIS — R0789 Other chest pain: Secondary | ICD-10-CM | POA: Diagnosis not present

## 2021-06-07 ENCOUNTER — Encounter: Payer: Self-pay | Admitting: Oncology

## 2021-06-07 ENCOUNTER — Encounter: Payer: Self-pay | Admitting: Hematology and Oncology

## 2021-06-20 ENCOUNTER — Other Ambulatory Visit: Payer: Self-pay | Admitting: Oncology

## 2021-06-20 DIAGNOSIS — G4700 Insomnia, unspecified: Secondary | ICD-10-CM

## 2021-06-20 MED ORDER — ZOLPIDEM TARTRATE 5 MG PO TABS: ORAL_TABLET | ORAL | 0 refills | Status: DC

## 2021-06-25 DIAGNOSIS — J9611 Chronic respiratory failure with hypoxia: Secondary | ICD-10-CM | POA: Diagnosis not present

## 2021-06-25 DIAGNOSIS — J449 Chronic obstructive pulmonary disease, unspecified: Secondary | ICD-10-CM | POA: Diagnosis not present

## 2021-06-26 DIAGNOSIS — R0789 Other chest pain: Secondary | ICD-10-CM | POA: Diagnosis not present

## 2021-07-01 ENCOUNTER — Other Ambulatory Visit: Payer: Self-pay | Admitting: Oncology

## 2021-07-07 DIAGNOSIS — R0789 Other chest pain: Secondary | ICD-10-CM | POA: Diagnosis not present

## 2021-07-15 DIAGNOSIS — J449 Chronic obstructive pulmonary disease, unspecified: Secondary | ICD-10-CM | POA: Diagnosis not present

## 2021-07-15 DIAGNOSIS — J209 Acute bronchitis, unspecified: Secondary | ICD-10-CM | POA: Diagnosis not present

## 2021-07-24 ENCOUNTER — Other Ambulatory Visit: Payer: Self-pay | Admitting: Hematology and Oncology

## 2021-07-24 DIAGNOSIS — G4700 Insomnia, unspecified: Secondary | ICD-10-CM

## 2021-07-24 MED ORDER — ZOLPIDEM TARTRATE 5 MG PO TABS: ORAL_TABLET | ORAL | 0 refills | Status: DC

## 2021-07-25 DIAGNOSIS — J9611 Chronic respiratory failure with hypoxia: Secondary | ICD-10-CM | POA: Diagnosis not present

## 2021-07-25 DIAGNOSIS — J449 Chronic obstructive pulmonary disease, unspecified: Secondary | ICD-10-CM | POA: Diagnosis not present

## 2021-08-08 DIAGNOSIS — M1990 Unspecified osteoarthritis, unspecified site: Secondary | ICD-10-CM | POA: Diagnosis not present

## 2021-08-08 DIAGNOSIS — J9611 Chronic respiratory failure with hypoxia: Secondary | ICD-10-CM | POA: Diagnosis not present

## 2021-08-08 DIAGNOSIS — R739 Hyperglycemia, unspecified: Secondary | ICD-10-CM | POA: Diagnosis not present

## 2021-08-08 DIAGNOSIS — E785 Hyperlipidemia, unspecified: Secondary | ICD-10-CM | POA: Diagnosis not present

## 2021-08-08 DIAGNOSIS — J449 Chronic obstructive pulmonary disease, unspecified: Secondary | ICD-10-CM | POA: Diagnosis not present

## 2021-08-08 DIAGNOSIS — D539 Nutritional anemia, unspecified: Secondary | ICD-10-CM | POA: Diagnosis not present

## 2021-08-08 DIAGNOSIS — I471 Supraventricular tachycardia: Secondary | ICD-10-CM | POA: Diagnosis not present

## 2021-08-08 DIAGNOSIS — C3491 Malignant neoplasm of unspecified part of right bronchus or lung: Secondary | ICD-10-CM | POA: Diagnosis not present

## 2021-08-08 DIAGNOSIS — Z9181 History of falling: Secondary | ICD-10-CM | POA: Diagnosis not present

## 2021-08-08 DIAGNOSIS — I10 Essential (primary) hypertension: Secondary | ICD-10-CM | POA: Diagnosis not present

## 2021-08-08 DIAGNOSIS — G4700 Insomnia, unspecified: Secondary | ICD-10-CM | POA: Diagnosis not present

## 2021-08-16 ENCOUNTER — Encounter: Payer: Self-pay | Admitting: Hematology and Oncology

## 2021-08-19 ENCOUNTER — Other Ambulatory Visit: Payer: Self-pay | Admitting: Hematology and Oncology

## 2021-08-19 DIAGNOSIS — G4700 Insomnia, unspecified: Secondary | ICD-10-CM

## 2021-08-19 MED ORDER — ZOLPIDEM TARTRATE 5 MG PO TABS: ORAL_TABLET | ORAL | 0 refills | Status: DC

## 2021-08-23 ENCOUNTER — Inpatient Hospital Stay: Payer: Medicare Other | Attending: Internal Medicine | Admitting: Oncology

## 2021-08-23 ENCOUNTER — Encounter: Payer: Self-pay | Admitting: Oncology

## 2021-08-23 ENCOUNTER — Inpatient Hospital Stay: Payer: Medicare Other

## 2021-08-23 ENCOUNTER — Other Ambulatory Visit: Payer: Self-pay | Admitting: Oncology

## 2021-08-23 ENCOUNTER — Telehealth: Payer: Self-pay | Admitting: Oncology

## 2021-08-23 VITALS — BP 132/77 | HR 95 | Temp 97.1°F | Resp 20 | Ht 59.0 in | Wt 131.2 lb

## 2021-08-23 DIAGNOSIS — Z87891 Personal history of nicotine dependence: Secondary | ICD-10-CM | POA: Insufficient documentation

## 2021-08-23 DIAGNOSIS — C3431 Malignant neoplasm of lower lobe, right bronchus or lung: Secondary | ICD-10-CM

## 2021-08-23 DIAGNOSIS — Z7982 Long term (current) use of aspirin: Secondary | ICD-10-CM | POA: Insufficient documentation

## 2021-08-23 DIAGNOSIS — Z9221 Personal history of antineoplastic chemotherapy: Secondary | ICD-10-CM | POA: Diagnosis not present

## 2021-08-23 DIAGNOSIS — Z923 Personal history of irradiation: Secondary | ICD-10-CM | POA: Insufficient documentation

## 2021-08-23 DIAGNOSIS — D649 Anemia, unspecified: Secondary | ICD-10-CM | POA: Diagnosis not present

## 2021-08-23 DIAGNOSIS — M8580 Other specified disorders of bone density and structure, unspecified site: Secondary | ICD-10-CM | POA: Diagnosis not present

## 2021-08-23 DIAGNOSIS — Z79899 Other long term (current) drug therapy: Secondary | ICD-10-CM | POA: Diagnosis not present

## 2021-08-23 DIAGNOSIS — E876 Hypokalemia: Secondary | ICD-10-CM | POA: Insufficient documentation

## 2021-08-23 DIAGNOSIS — Z8616 Personal history of COVID-19: Secondary | ICD-10-CM | POA: Insufficient documentation

## 2021-08-23 LAB — BASIC METABOLIC PANEL
BUN: 18 (ref 4–21)
CO2: 22 (ref 13–22)
Chloride: 109 — AB (ref 99–108)
Creatinine: 0.8 (ref 0.5–1.1)
Glucose: 140
Potassium: 3.2 mEq/L — AB (ref 3.5–5.1)
Sodium: 141 (ref 137–147)

## 2021-08-23 LAB — HEPATIC FUNCTION PANEL
ALT: 23 U/L (ref 7–35)
AST: 25 (ref 13–35)
Alkaline Phosphatase: 70 (ref 25–125)
Bilirubin, Total: 0.5

## 2021-08-23 LAB — CBC: RBC: 3.7 — AB (ref 3.87–5.11)

## 2021-08-23 LAB — CBC AND DIFFERENTIAL
HCT: 33 — AB (ref 36–46)
Hemoglobin: 10.8 — AB (ref 12.0–16.0)
Neutrophils Absolute: 4.32
Platelets: 171 10*3/uL (ref 150–400)
WBC: 6

## 2021-08-23 LAB — COMPREHENSIVE METABOLIC PANEL
Albumin: 4 (ref 3.5–5.0)
Calcium: 9.4 (ref 8.7–10.7)

## 2021-08-23 NOTE — Telephone Encounter (Signed)
Per 08/23/21 los next appt scheduled and confirmed with patient

## 2021-08-23 NOTE — Progress Notes (Signed)
[facility]  [address] [place],  [place]  [address] [phone]  Clinic Day:  08/23/21  Referring physician: Lowella Dandy, NP  ASSESSMENT & PLAN:   Assessment & Plan:  Stage IIIB (T1b N3 M0) squamous cell lung cancer, diagnosed in June 2022.  She received concurrent chemoradiation and completed 6 cycles of carboplatin/Abraxane.  I do not feel she needs further chemotherapy at this time.  She completed radiation therapy on September 6th, and CT imaging has shown a good response. She started maintenance immunotherapy with durvalumab for 1 year on October 11th and only received 2 cycles. Her last dose of immunotherapy was on October 27th. She had a prolonged hospitalization after pneumonia and was never able to resume therapy. CT imaging from February 2023 revealed no evidence of progressive disease.   2.  COVID infection, August 2022, requiring hospitalization for pneumonia from November 8th to December 1st. She is improved.  3.  Osteopenia.  She is up to date on bone density scans as of April 2022, which are scheduled through Dr. Delena Bali office.  4.  Anemia, consistent with iron deficiency.  She stopped the ferrous sulfate due to constipation alternating with diarrhea. We will have her try taking once weekly.  5.  Hypokalemia.  This is mild but slightly worse so I recommended she increase supplement from 1 BID to 2 BID.   She is grieving for both her husband and daughter now.  She continues on surveillance only.  She may continue supplemental oxygen as needed. We flushed her port today. We will plan to see her back in 3 months with CBC, CMP, CEA and CT chest for repeat evaluation. She understands and agrees with this plan of care. I have answered her questions and she knows to call with any concerns.   I provided 20 minutes of face-to-face time during this this encounter and > 50% was spent counseling as documented under my assessment and  plan.   ADDENDUM: CEA is down from 7.1 in February to 5.9.    Derwood Kaplan, MD  [facility] [address] [place] [address] Dept: [phone] Dept Fax: [phone]       CHIEF COMPLAINT:  CC: Stage IIIB squamous cell lung cancer  Current Treatment: Surveillance   HISTORY OF PRESENT ILLNESS:  Stage IIIB (T1b, N3, M0) squamous cell lung cancer diagnosed in June 2022.  This began when the patient started to experience intermittent chest pain and shortness of breath, and followed up with cardiology for further evaluation.  Chest x-ray was abnormal.  Due to her history of COPD and long history of smoking she was referred to pulmonology.  CT chest from May revealed a new highly suspicious spiculated pulmonary nodule within the superior lateral aspect of the right lower lobe measuring 1.5 x 1.0 x 1.4 cm with central small cavitation highly suspicious to be a neoplastic nodule.  There was also nodularity along the upper wall of the left mainstem bronchus of uncertain etiology but also suspicious for additional neoplastic process.  CT super D of the chest from June revealed enlargement of the thick-walled spiculated cavitary nodule in the superior segment of the right lower lobe measuring 1.6 x 1.6.  There was also numerous mediastinal lymph nodes some of which are enlarged including high AP window lymph node measuring 1.2 cm, lower right paratracheal lymph node measuring 1.4 cm. Final pathology confirmed malignant cells consistent with  non-small cell carcinoma. Overall, the morphology and immunophenotype favor a squamous cell carcinoma.  Staging PET scan showed the hypermetabolic right lower lobe lung lesion consistent with primary lung neoplasm.  There was also mediastinal and hilar lymphadenopathy but no findings to suggest abdominal/pelvic metastatic disease or osseous metastatic disease for a T1b N3 M0, stage IIIB.  MRI  brain was negative for metastasis.  She was referred to Dr. Julien Nordmann, oncology, and was started concurrent chemoradiation with weekly carboplatin/paclitaxel.  Her 1st cycle was July 11th.  With the 3rd cycle, she developed a reaction to paclitaxel and so was switched to Abraxane.  She has received 6 cycles of therapy.    She has a medical history significant for coronary artery disease status post stent placement, carotid artery disease, colon obstruction status post surgical intervention, depression, COPD, DVT, GERD, intracranial hemorrhage, hypertension, strokes, anemia and Barrett's esophagus. [name] states that she has quit smoking since her diagnosis in July 21, 2022 after 60 pack years.  She contracted COVID back in August of 2022 and was quite ill and required hospitalization for 5 days.  She was readmitted in November and spent months in the hospital and rehab before returning home, so we never resumed immunotherapy. She just had the 2 doses in October of 2022.  Oncology History  Malignant neoplasm of bronchus of right lower lobe ([facility])  07/10/2020 Initial Diagnosis   Stage III squamous cell carcinoma of right lung ([facility])   07/10/2020 Cancer Staging   Staging form: Lung, AJCC 8th Edition - Clinical: Stage IIIB (cT1b, cN3, cM0) - Signed by Curt Bears, MD on 07/10/2020   07/30/2020 - 09/17/2020 Chemotherapy         10/25/2020 Imaging   CT chest: 1. Interval decrease in size of the right lower lobe lung lesion.  2. Progressive airspace opacity and atelectasis in the left upper  lobe.  3. Persistent small left effusion and left lower lobe atelectasis or  infiltrate.  4. New 15.5 mm sub solid nodular lesion in the right upper lobe just above the minor fissure. It is possible this is an area of  inflammation but attention on future studies is suggested. Recommend follow-up noncontrast chest CT in 3-4 months.  5. Stable emphysematous changes and pulmonary scarring.  6. Slightly smaller borderline  enlarged mediastinal and hilar lymph  nodes.  7. Stable atherosclerotic calcifications involving the aorta and  branch vessels including three-vessel coronary artery calcifications.    10/30/2020 - 11/15/2020 Chemotherapy   Patient is on Treatment Plan : LUNG Durvalumab q14d        INTERVAL HISTORY:  Kaisey is here for follow up after being widowed earlier this year. Her husband expired in Apr 20, 2022 and then her daughter died of a MVA in 07/21/2022. She is understandably depressed and feels that she is going through this alone now.  She has had insomnia, which has improved with Ambien.  She does admit to depression as she is still grieving, and has not been able to continue her normal activities. She notes increased bruising on aspirin 81  mg daily She is also taking a woman's MVI. She stopped the iron supplement due to constipation alternating with diarrhea so I suggested she try 1 weekly since her hemoglobin is improving. She continues Wellbutrin 150 mg daily as well as Zoloft 200 mg daily. Hemoglobin is slightly better at 10.8, and white count and platelets are normal. Chemistries are unremarkable except for a low potassium of 3.2, down from 3.3 last time despite supplement of 20  meq BID. Her  appetite is fair, and her weight is down 1 pound since her last visit.  She denies fever, chills or other signs of infection.  She denies nausea, vomiting, bowel issues, or abdominal pain.  She denies sore throat, cough, dyspnea, or chest pain.  REVIEW OF SYSTEMS:  Review of Systems  Constitutional:  Positive for fatigue. Negative for appetite change, chills, fever and unexpected weight change.       Generalized weakness, moderate  HENT:  Negative.    Eyes: Negative.   Respiratory:  Negative for chest tightness, cough, hemoptysis and wheezing. Shortness of breath: with minimal exertion, on supplemental oxygen.  Cardiovascular: Negative.  Negative for chest pain, leg swelling and palpitations.  Gastrointestinal:  Negative.  Negative for abdominal distention, abdominal pain, blood in stool, constipation, diarrhea, nausea and vomiting.  Endocrine: Negative.   Genitourinary: Negative.  Negative for difficulty urinating, dysuria, frequency and hematuria.   Musculoskeletal:  Negative for arthralgias, back pain, flank pain, gait problem and myalgias.  Skin:  Negative for wound.  Neurological:  Negative for dizziness, extremity weakness (using a wheelchair), gait problem, headaches, light-headedness, numbness, seizures and speech difficulty.  Hematological: Negative.   Psychiatric/Behavioral:  Sleep disturbance: insomnia. The patient is not nervous/anxious.      VITALS:  Blood pressure 132/77, pulse 95, temperature (!) 97.1 F (36.2 C), temperature source Tympanic, resp. rate 20, height 4\' 11"  (1.499 m), weight 131 lb 3.2 oz (59.5 kg), SpO2 100 %.  Wt Readings from Last 3 Encounters:  08/23/21 131 lb 3.2 oz (59.5 kg)  05/23/21 132 lb 12.8 oz (60.2 kg)  03/25/21 132 lb (59.9 kg)    Body mass index is 26.5 kg/m.  Performance status (ECOG): 1 - Symptomatic but completely ambulatory  PHYSICAL EXAM:  Physical Exam Constitutional:      General: She is not in acute distress.    Appearance: Normal appearance. She is normal weight.  HENT:     Head: Normocephalic and atraumatic.  Eyes:     General: No scleral icterus.    Extraocular Movements: Extraocular movements intact.     Conjunctiva/sclera: Conjunctivae normal.     Pupils: Pupils are equal, round, and reactive to light.  Cardiovascular:     Rate and Rhythm: Normal rate and regular rhythm.     Pulses: Normal pulses.     Heart sounds: Normal heart sounds. No murmur heard.    No friction rub. No gallop.  Pulmonary:     Effort: Pulmonary effort is normal. No respiratory distress.     Breath sounds: Decreased breath sounds (of the left base) and rhonchi (course on inspiration in all lung fields) present.  Abdominal:     General: Bowel sounds are  normal. There is no distension.     Palpations: Abdomen is soft. There is no hepatomegaly, splenomegaly or mass.     Tenderness: There is no abdominal tenderness.  Musculoskeletal:        General: Normal range of motion.     Cervical back: Normal range of motion and neck supple.     Right lower leg: No edema.     Left lower leg: No edema.  Lymphadenopathy:     Cervical: No cervical adenopathy.  Skin:    General: Skin is warm and dry.  Neurological:     General: No focal deficit present.     Mental Status: She is alert and oriented to person, place, and time. Mental status is at baseline.  Psychiatric:  Mood and Affect: Mood normal.        Behavior: Behavior normal.        Thought Content: Thought content normal.        Judgment: Judgment normal.     LABS:      Latest Ref Rng & Units 08/23/2021   12:00 AM 05/23/2021   12:00 AM 04/25/2021   12:00 AM  CBC  WBC  6.0     5.3     5.8      Hemoglobin 12.0 - 16.0 10.8     10.5     10.5      Hematocrit 36 - 46 33     33     33      Platelets 150 - 400 K/uL 171     177     190         This result is from an external source.      Latest Ref Rng & Units 08/23/2021   12:00 AM 05/23/2021   12:00 AM 04/25/2021   12:00 AM  CMP  BUN 4 - 21 18     20     16       Creatinine 0.5 - 1.1 0.8     0.9     0.8      Sodium 137 - 147 141     144     144      Potassium 3.5 - 5.1 mEq/L 3.2     3.3     3.5      Chloride 99 - 108 109     109     113      CO2 13 - 22 22     22     19       Calcium 8.7 - 10.7 9.4     9.2     9.3      Alkaline Phos 25 - 125 70     77     80      AST 13 - 35 25     26     25       ALT 7 - 35 U/L 23     19     20          This result is from an external source.     Lab Results  Component Value Date   CEA1 5.9 (H) 08/23/2021   /  CEA  Date Value Ref Range Status  08/23/2021 5.9 (H) 0.0 - 4.7 ng/mL Final    Comment:    (NOTE)                             Nonsmokers          <3.9                              Smokers             <5.6 [employer] Electrochemiluminescence Immunoassay (ECLIA) Values obtained with different assay methods or kits cannot be used interchangeably.  Results cannot be interpreted as absolute evidence of the presence or absence of malignant disease. Performed At: [facility] [place], [place] [id number] Rush Farmer MD ID:[id number]     Lab Results  Component Value Date   TIBC 422 04/25/2021   TIBC 417 11/13/2020   FERRITIN 42 04/25/2021  FERRITIN 29 11/13/2020   IRONPCTSAT 13 04/25/2021   IRONPCTSAT 22 11/13/2020   IRONPCTSAT 7.7 (L) 06/19/2020   Lab Results  Component Value Date   LDH 166 06/19/2020    STUDIES:    EXAM: 05/20/21 CT CHEST WITH CONTRAST  TECHNIQUE:  Multidetector CT imaging of the chest was performed during  intravenous contrast administration.  RADIATION DOSE REDUCTION: This exam was performed according to the  departmental dose-optimization program which includes automated  exposure control, adjustment of the mA and/or kV according to  patient size and/or use of iterative reconstruction technique.  CONTRAST: 60 cc Isovue 370  COMPARISON: Direct comparison 03/18/2021 diagnostic chest CT.  Secondary comparison to the interval 02/28/2021 CTA chest   FINDINGS:  Cardiovascular: Right Port-A-Cath tip high right atrium. Aortic  atherosclerosis. Mild cardiomegaly. Left main and 3 vessel coronary  artery calcification. Trace pericardial fluid is likely physiologic.  Pulmonary artery enlargement, outflow tract 3.2 cm. No central  pulmonary embolism, on this non-dedicated study.  Mediastinum/Nodes: No supraclavicular adenopathy. 9 mm pretracheal  node on 39/2 measures 11 mm on the prior. No hilar adenopathy. Tiny  hiatal hernia. Small prevascular nodes are unchanged.  Lungs/Pleura: Small, right larger than left pleural effusions.  Slightly decreased compared to 02/26/2021.  Moderate centrilobular emphysema.   Again identified is radiation induced consolidation within the  superior segment right lower lobe, with adjacent fiducials. This  presumably obscures the known right lower lobe neoplasm.  Significantly improved aeration throughout the remainder of the  lungs. The only remaining area of mild airspace disease with  suggestion of architectural distortion is seen within the left upper  lobe including on 46/301.  Upper Abdomen: Normal imaged portions of the liver, spleen, adrenal  glands, kidneys.  Musculoskeletal: Lower anterior right rib remote fractures.   IMPRESSION:  1. Redemonstration of radiation induced consolidation within the  superior segment right lower lobe. No specific findings of  recurrent/metastatic disease.  2. Since 02/26/2021, significantly improved aeration, most  consistent with resolved infection/inflammation. Favor developing  post infectious/inflammatory scarring within the left upper lobe.  3. Small mediastinal nodes, including decreased size of a dominant  node, nonspecific.  4. Decreased small bilateral pleural effusions.  5. Aortic atherosclerosis (ICD10-I70.0), coronary artery  atherosclerosis and emphysema (ICD10-J43.9).  6. Tiny hiatal hernia.  7. Pulmonary artery enlargement suggests pulmonary arterial  hypertension.   Electronically Signed  By: Abigail Miyamoto M.D.  On: 05/21/2021 08:55    HISTORY:   Allergies:  Allergies  Allergen Reactions   Pregabalin Swelling    Tongue swelling   Paclitaxel    Sulfonamide Derivatives Swelling    Childhood reaction    Current Medications: Current Outpatient Medications  Medication Sig Dispense Refill   aspirin EC 81 MG tablet Take 2 tablets (162 mg total) by mouth in the morning. Okay to restart this medication on 07/04/2020 30 tablet 11   atorvastatin (LIPITOR) 80 MG tablet Take 80 mg by mouth every evening.      budesonide (PULMICORT) 0.5 MG/2ML nebulizer solution Take by nebulization.     buPROPion  (WELLBUTRIN XL) 150 MG 24 hr tablet Take 1 tablet (150 mg total) by mouth daily. 90 tablet 3   fish oil-omega-3 fatty acids 1000 MG capsule Take 1,000 mg by mouth in the morning.     fluticasone (FLONASE) 50 MCG/ACT nasal spray Place 1 spray into both nostrils daily as needed for allergies.     folic acid (FOLVITE) 742 MCG tablet Take 800 mcg by mouth in the  morning.     ipratropium-albuterol (DUONEB) 0.5-2.5 (3) MG/3ML SOLN USE 1 VIAL VIA NEBULIZER EVERY 6 HOURS AS NEEDED 360 mL 11   metoprolol succinate (TOPROL-XL) 25 MG 24 hr tablet Take by mouth.     mirtazapine (REMERON) 45 MG tablet Take 45 mg by mouth at bedtime.      nitroGLYCERIN (NITROSTAT) 0.4 MG SL tablet DISSOLVE 1 TABLET UNDER THE TONGUE EVERY 5 MINUTES AS  NEEDED FOR CHEST PAIN. MAX  OF 3 TABLETS IN 15 MINUTES. CALL 911 IF PAIN PERSISTS. 100 tablet 3   ondansetron (ZOFRAN) 4 MG tablet Take 4 mg by mouth every 8 (eight) hours as needed for nausea or vomiting.     pantoprazole (PROTONIX) 40 MG tablet Take 40 mg by mouth in the morning.     potassium chloride SA (KLOR-CON M) 20 MEQ tablet Take 1 tablet (20 mEq total) by mouth 2 (two) times daily. 60 tablet 5   sertraline (ZOLOFT) 100 MG tablet Take 200 mg by mouth in the morning.     zolpidem (AMBIEN) 5 MG tablet TAKE 1 TABLET BY MOUTH ONCE DAILY AT BEDTIME AS NEEDED FOR SLEEP. 30 tablet 0   No current facility-administered medications for this visit.

## 2021-08-25 DIAGNOSIS — J9611 Chronic respiratory failure with hypoxia: Secondary | ICD-10-CM | POA: Diagnosis not present

## 2021-08-25 DIAGNOSIS — J449 Chronic obstructive pulmonary disease, unspecified: Secondary | ICD-10-CM | POA: Diagnosis not present

## 2021-08-25 LAB — CEA: CEA: 5.9 ng/mL — ABNORMAL HIGH (ref 0.0–4.7)

## 2021-08-27 ENCOUNTER — Telehealth: Payer: Self-pay

## 2021-08-27 NOTE — Telephone Encounter (Signed)
-----   Message from Derwood Kaplan, MD sent at 08/23/2021  7:14 PM EDT ----- Juluis Rainier ----- Message ----- From: Daryel November, LPN Sent: 09/24/720   4:43 PM EDT To: Derwood Kaplan, MD  Patient will call back the first of the week, she is not home at this time. ----- Message ----- From: Belva Chimes, LPN Sent: 05/26/5049   4:35 PM EDT To: Daryel November, LPN  Please call patient and clarify potassium dosage and how often she is taking it. Send back to Dr. Hinton Rao.

## 2021-08-27 NOTE — Telephone Encounter (Signed)
Patient called back and states she is taking her Potassium 55mEq one tablet twice a day.

## 2021-08-28 ENCOUNTER — Encounter: Payer: Self-pay | Admitting: Hematology and Oncology

## 2021-08-28 NOTE — Telephone Encounter (Signed)
Patient notified

## 2021-09-03 ENCOUNTER — Telehealth: Payer: Self-pay

## 2021-09-03 NOTE — Telephone Encounter (Signed)
-----   Message from Derwood Kaplan, MD sent at 08/23/2021  6:19 PM EDT ----- Regarding: call Tell her the potassium is even lower, is she taking 1 bid? If so, we need to double it to 2 bid.  The rest of the blood looks good

## 2021-09-13 ENCOUNTER — Encounter: Payer: Self-pay | Admitting: Hematology and Oncology

## 2021-09-16 ENCOUNTER — Encounter: Payer: Self-pay | Admitting: Hematology and Oncology

## 2021-09-19 DIAGNOSIS — Z9181 History of falling: Secondary | ICD-10-CM | POA: Diagnosis not present

## 2021-09-19 DIAGNOSIS — Z0000 Encounter for general adult medical examination without abnormal findings: Secondary | ICD-10-CM | POA: Diagnosis not present

## 2021-09-24 ENCOUNTER — Other Ambulatory Visit: Payer: Self-pay

## 2021-09-24 DIAGNOSIS — G4700 Insomnia, unspecified: Secondary | ICD-10-CM

## 2021-09-24 MED ORDER — ZOLPIDEM TARTRATE 5 MG PO TABS: ORAL_TABLET | ORAL | 0 refills | Status: DC

## 2021-10-01 ENCOUNTER — Other Ambulatory Visit: Payer: Self-pay

## 2021-10-01 MED ORDER — METOPROLOL SUCCINATE ER 25 MG PO TB24: 25.0000 mg | ORAL_TABLET | Freq: Every day | ORAL | 0 refills | Status: DC

## 2021-10-16 ENCOUNTER — Encounter: Payer: Self-pay | Admitting: Hematology and Oncology

## 2021-10-17 NOTE — Progress Notes (Deleted)
Cardiology Office Note:    Date:  10/17/2021   ID:  [name], DOB 04-02-1940, MRN [id number]  PCP:  Lowella Dandy, NP   [facility]  Cardiologist:  Freada Bergeron, MD  Advanced Practice Provider:  No care team member to display Electrophysiologist:  Will Meredith Leeds, MD    Referring MD: Lowella Dandy, NP     History of Present Illness:    [name] is a 81 y.o. female with a hx of CAD s/p PCI to OM1, HTN, HLD, and PAD who was previously followed by Dr. Meda Coffee now returning to clinic for follow-up.  Per review of the record, the patient underwent a cardiac cath on 12/20/2012 with finding of severe stenosis in OM1 and received 2 DES. In May 2015 had a SBO and required partial colon resection. She underwent right carotid endarterectomy on 97/28/2060 that was complicated by postop stroke in the recovery room. This resulted in a left hemiplegia. She was hospitalized in December 2020 with retrosternal chest pressure, and was diagnosed with non-STEMI. High-sensitivity troponin was up to 23,000. She underwent cardiac catheterization that showed distal total occlusion of the obtuse marginal branch of the left circumflex coronary artery.  There was faint collateralization from the LAD.  Her LVEF was 55 to 60%. No PCI performed and patient was continued on medical management.  Today, she is accompanied by her husband. She is feeling unwell due to back pain which has been ongoing for several months. She has not seen anyone for it yet and is interested in a referral. She states she does not want surgery or to do PT. She is hoping to have injection therapy like her sister did.   Otherwise, she is doing well from a cardiac standpoint. Denies any chest discomfort, SOB, LE edema, dizziness, or focal weakness. She is tolerating her current medications. Her blood pressure at home is well-controlled at this time. Her last catheterization was in 2020 where she  presented with chest pain. Since then, she has had no similar symptoms.   Has 65 pack year smoking history.   Past Medical History:  Diagnosis Date   Acute respiratory failure with hypoxia s/p tracheostomy 07/20/2013   Anemia    a. 7-08/2013 felt due to recent critical illness/chronic disease.   Arthritis    handds, knees & back    B12 deficiency 11/14/2020   Barrett's esophagus    CAD (coronary artery disease)    a. Mod dz 2010 initially mgd medically. b. 12/2012 - angina s/p PTCA/DES to mid-circumflex, PTCA/DES to first OM.    Cancer [facility])    Carotid artery disease ([employer])    a. 60-70% bilat ICA stenosis by dopplers 05/2012.   Chronic back pain    deteriorating;DDD   Colonic obstruction due to suspected colitis; s/p colectomy/colostomy    a. See SBO.   Complication of anesthesia    hard to wake up from anesthesia    COPD (chronic obstructive pulmonary disease) (HCC)    Depression    takes Remeron and Zoloft daily   Dizziness    was on Bentyl which caused this-took off of it and no problems since   DVT of axillary vein, acute right ([place])    a. Dx 07/2013 felt due to R IJ central line that had been inserted 7/14. Not on anticoag due to GIB issues and small cerebral bleed.   GERD (gastroesophageal reflux disease)    Heart murmur    History  of bronchitis    > 76yrs ago    History of colon polyps    History of hiatal hernia    History of kidney stones    was told it was stable but doesn't know for sure that she ever passed it   HTN (hypertension)    Hypertension    takes Metoprolol daily   Intracranial hemorrhage ([place])    a. 08/11/13 - per DC summary, tiny SAH vs SDH done for altered mentation, anticoag stopped including aspirin.   Joint pain    Neck pain    DDD   Other and unspecified hyperlipidemia    takes Lipitor daily   Pneumonia    hx of->15 yrs ago   Protein-calorie malnutrition, severe w/ electrolyte imbalance    a. Severe hypoalbuminemia leading to 3rd spacing  including anasarca and pulm edema 07/2013.   SBO (small bowel obstruction) ([place])    a. Lysis of adhesions and ovarian cystectomy 04/2013 for sBO. b. Admitted 07/2013 for colonic obstruction due to suspected colitis, s/p partial colectomy/colostomy 08/01/13. Admission complicated by anasarca, acute resp failure requiring tracheostomy, decannulated 08/25/13. c. Recurrent SBO8/2015, NGT placed for decompression.    Stroke [facility]) early 80's   right sided weakness--TIA    Past Surgical History:  Procedure Laterality Date   ABDOMINAL HYSTERECTOMY     APPENDECTOMY     BOWEL RESECTION N/A 08/01/2013   Procedure: SMALL BOWEL RESECTION;  Surgeon: Harl Bowie, MD;  Location: [place];  Service: General;  Laterality: N/A;   BRONCHIAL BIOPSY  07/02/2020   Procedure: BRONCHIAL BIOPSIES;  Surgeon: Collene Gobble, MD;  Location[facility] ENDOSCOPY;  Service: Pulmonary;;   BRONCHIAL BRUSHINGS  07/02/2020   Procedure: BRONCHIAL BRUSHINGS;  Surgeon: Collene Gobble, MD;  Location: [facility] ENDOSCOPY;  Service: Pulmonary;;   BRONCHIAL NEEDLE ASPIRATION BIOPSY  07/02/2020   Procedure: BRONCHIAL NEEDLE ASPIRATION BIOPSIES;  Surgeon: Collene Gobble, MD;  Location: [place];  Service: Pulmonary;;   BRONCHIAL WASHINGS  07/02/2020   Procedure: BRONCHIAL WASHINGS;  Surgeon: Collene Gobble, MD;  Location: [place] ENDOSCOPY;  Service: Pulmonary;;   cataract surgery Bilateral    CHOLECYSTECTOMY     COLON RESECTION     COLON SURGERY     COLOSTOMY N/A 08/01/2013   Procedure: COLOSTOMY;  Surgeon: Harl Bowie, MD;  Location: [place];  Service: General;  Laterality: N/A;   COLOSTOMY TAKEDOWN  01/24/2014   dr blackman   COLOSTOMY TAKEDOWN N/A 01/24/2014   Procedure: COLOSTOMY TAKEDOWN;  Surgeon: Coralie Keens, MD;  Location: [place];  Service: General;  Laterality: N/A;   CORONARY ANGIOPLASTY WITH STENT PLACEMENT  12/20/2012   STENT TO OM         DR COOPER   ENDARTERECTOMY Right 08/08/2014   Procedure: RIGHT CAROTID ENDARTERECTOMY  WITH HEMASHIELD PATCH ANGIOPLASTY;  Surgeon: Elam Dutch, MD;  Location: [place];  Service: Vascular;  Laterality: Right;   FIDUCIAL MARKER PLACEMENT  07/02/2020   Procedure: FIDUCIAL MARKER PLACEMENT;  Surgeon: Collene Gobble, MD;  Location: [place] ENDOSCOPY;  Service: Pulmonary;;   FOOT SURGERY Left    HAND SURGERY Bilateral    for ganglion cysts   HEMOSTASIS CONTROL  07/02/2020   Procedure: HEMOSTASIS CONTROL;  Surgeon: Collene Gobble, MD;  Location: [facility] ENDOSCOPY;  Service: Pulmonary;;  ice saline and epi   IR IMAGING GUIDED PORT INSERTION  08/23/2020   LAPAROTOMY N/A 08/01/2013   Procedure: EXPLORATORY LAPAROTOMY;  Surgeon: Harl Bowie, MD;  Location[facility]  OR;  Service: General;  Laterality: N/A;   LEFT HEART CATH AND CORONARY ANGIOGRAPHY N/A 01/10/2019   Procedure: LEFT HEART CATH AND CORONARY ANGIOGRAPHY;  Surgeon: Troy Sine, MD;  Location: [facility] CV LAB;  Service: Cardiovascular;  Laterality: N/A;   LEFT HEART CATHETERIZATION WITH CORONARY ANGIOGRAM N/A 12/20/2012   Procedure: LEFT HEART CATHETERIZATION WITH CORONARY ANGIOGRAM;  Surgeon: Blane Ohara, MD;  Location: [facility] CATH LAB;  Service: Cardiovascular;  Laterality: N/A;   PARTIAL COLECTOMY N/A 08/01/2013   Procedure: PARTIAL COLECTOMY;  Surgeon: Harl Bowie, MD;  Location: [place];  Service: General;  Laterality: N/A;   ROTATOR CUFF REPAIR Left    TONSILLECTOMY     TRACHEOSTOMY     feinstein   VIDEO BRONCHOSCOPY WITH ENDOBRONCHIAL NAVIGATION N/A 07/02/2020   Procedure: VIDEO BRONCHOSCOPY WITH ENDOBRONCHIAL NAVIGATION;  Surgeon: Collene Gobble, MD;  Location: [place] ENDOSCOPY;  Service: Pulmonary;  Laterality: N/A;    Current Medications: No outpatient medications have been marked as taking for the 10/23/21 encounter (Appointment) with Freada Bergeron, MD.     Allergies:   Pregabalin, Paclitaxel, and Sulfonamide derivatives   Social History   Socioeconomic History   Marital status: Married    Spouse name:  Not on file   Number of children: Not on file   Years of education: Not on file   Highest education level: Not on file  Occupational History   Occupation: full time  Tobacco Use   Smoking status: Former    Packs/day: 1.00    Years: 60.00    Total pack years: 60.00    Types: Cigarettes    Quit date: 06/2020    Years since quitting: 1.3   Smokeless tobacco: Never   Tobacco comments:    Stopped smoking in May 2022. ARJ 08/10/20  Vaping Use   Vaping Use: Never used  Substance and Sexual Activity   Alcohol use: No    Alcohol/week: 0.0 standard drinks of alcohol   Drug use: No   Sexual activity: Not Currently    Birth control/protection: Surgical    Comment: Hysterectomy  Other Topics Concern   Not on file  Social History Narrative   Not on file   Social Determinants of Health   Financial Resource Strain: Not on file  Food Insecurity: Not on file  Transportation Needs: Not on file  Physical Activity: Not on file  Stress: Not on file  Social Connections: Not on file     Family History: The patient's family history includes AAA (abdominal aortic aneurysm) in her father; Heart attack in her father; Heart disease in her father and another family member; Varicose Veins in her mother.  ROS:   Please see the history of present illness.    Review of Systems  Constitutional:  Negative for fever and malaise/fatigue.  HENT:  Negative for ear pain and hearing loss.   Eyes:  Negative for blurred vision.  Respiratory:  Negative for cough and shortness of breath.   Cardiovascular:  Negative for chest pain and palpitations.  Gastrointestinal:  Negative for nausea and vomiting.  Genitourinary:  Negative for flank pain.  Musculoskeletal:  Positive for back pain. Negative for falls and myalgias.  Neurological:  Negative for focal weakness and loss of consciousness.  Psychiatric/Behavioral:  Negative for depression.      EKGs/Labs/Other Studies Reviewed:    The following studies  were reviewed today: Cath 01/10/19:    Previously placed Mid Cx to Dist Cx stent (unknown type) is  widely patent. Mid Cx lesion is 30% stenosed. 2nd Mrg-1 lesion is 40% stenosed. Previously placed 2nd Mrg-2 stent (unknown type) is widely patent. 2nd Mrg-3 lesion is 100% stenosed. Prox RCA lesion is 30% stenosed. The left ventricular systolic function is normal. LV end diastolic pressure is mildly elevated.   Non-ST segment elevation myocardial infarction secondary to distal total occlusion of the obtuse marginal branch of the left circumflex coronary artery,   Normal left main, LAD and mild nonobstructive 30% narrowing in the RCA which supplies the posterior wall.   The left circumflex vessel has 40% ostial stenosis in the OM 2 vessel with a widely patent stent in the midportion of this marginal branch.  The distal marginal is 100% occluded.  There is faint collateralization from the LAD to the distal vessel which is small caliber; the AV groove circumflex has 30% narrowing in the stent in the AV groove circumflex is widely patent.   Serve global LV function with EF estimated 55%.  There is a subtle focal area of distal inferior hypocontractility.   RECOMMENDATION: The patient's ECG is entirely normal.  She is not having any recurrent chest pain.  Her MI was over 48 hours ago.  With her stability and distal occlusion recommend medical management.  Consider DAPT; however, will need to obtain additional information with her history of prior stroke following carotid endarterectomy remotely.  Alternatively aspirin alone and medical therapy for her current obstructive disease.  Smoking cessation is essential.  Aggressive lipid-lowering therapy with target LDL less than 70.  TTE 01/09/19: IMPRESSIONS     1. Left ventricular ejection fraction, by visual estimation, is 60 to  65%. The left ventricle has normal function. There is borderline left  ventricular hypertrophy.   2. Left ventricular  diastolic parameters are consistent with Grade II  diastolic dysfunction (pseudonormalization).   3. The left ventricle has no regional wall motion abnormalities.   4. Global right ventricle was not well visualized.The right ventricular  size is normal. No increase in right ventricular wall thickness.   5. Left atrial size was mild-moderately dilated.   6. Right atrial size was normal.   7. Presence of pericardial fat pad.   8. Small pericardial effusion.   9. The mitral valve is rheumatic. Trivial mitral valve regurgitation.  Mild mitral stenosis.  10. The tricuspid valve is normal in structure. Tricuspid valve  regurgitation is trivial.  11. The aortic valve is tricuspid. Aortic valve regurgitation is not  visualized. No evidence of aortic valve sclerosis or stenosis.  12. Pulmonic regurgitation is mild.  13. The pulmonic valve was grossly normal. Pulmonic valve regurgitation is  mild.   Zio [id number] 10/2018: Sinus rhythm to sinus tachycardia. Frequent nsVTs with maximum rate 177 BPM, the longest lasting 12 seconds. Frequent short runs of SVTs.   Sinus rhythm to sinus tachycardia. Frequent nsVTs with maximum rate 177 BPM, the longest lasting 12 seconds.   Patients symptoms didn't correlate with significant arrhythmias. Echocardiogram and referral to EP is recommended.   EKG:   05/11/20: NSR, Rate: 87 bpm, isolated PAC   Recent Labs: 04/25/2021: TSH 1.435 08/23/2021: ALT 23; BUN 18; Creatinine 0.8; Hemoglobin 10.8; Platelets 171; Potassium 3.2; Sodium 141  Recent Lipid Panel    Component Value Date/Time   CHOL 117 01/09/2019 0053   CHOL 150 01/30/2016 1052   TRIG 86 01/09/2019 0053   HDL 42 01/09/2019 0053   HDL 38 (L) 01/30/2016 1052   CHOLHDL 2.8 01/09/2019 0053   VLDL  17 01/09/2019 0053   LDLCALC 58 01/09/2019 0053   LDLCALC 84 01/30/2016 1052     Risk Assessment/Calculations:       Physical Exam:    VS:  There were no vitals taken for this visit.    Wt  Readings from Last 3 Encounters:  08/23/21 131 lb 3.2 oz (59.5 kg)  05/23/21 132 lb 12.8 oz (60.2 kg)  03/25/21 132 lb (59.9 kg)     GEN: Well nourished, well developed in no acute distress HEENT: Normal NECK: No JVD; No carotid bruits CARDIAC: RRR, no murmurs, rubs, gallops RESPIRATORY:  Clear to auscultation without rales, wheezing or rhonchi  ABDOMEN: Soft, non-tender, non-distended MUSCULOSKELETAL:  No edema; No deformity  SKIN: Warm and dry NEUROLOGIC:  Alert and oriented x 3 PSYCHIATRIC:  Normal affect   ASSESSMENT:    No diagnosis found.  PLAN:    In order of problems listed above:  #Coronary Artery Disease s/p PCI to LCx: Cath 2020 with occluded OM with collaterals from LAD. No PCI performed at that time. Now on medical management. No anginal symptoms.  -Continue ASA -Continue lipitor 80mg  daily -Continue metop 50mg  XL -Can consider ARB vs spiro at next visit but patient is concerned about dropping her blood pressure as has a history of dalls  #Chronic diastolic HF: Euvolemic with NYHA class II symptoms. Not on diuretics. -Continue metop 50mg  XL -Off diuretics -Can consider spiro at next visit pending blood pressures -Low Na diet  #Carotid artery disease: S/p right carotid endarterectomy - complicated by stroke. Followed by Dr Oneida Alar. No bruit.  Most recent carotid ultrasound in July 2020 showed stable 40 to 60% stenosis bilaterally. -Continue ASA and lipitor  #HTN: Well controlled.  -Continue metop as above  #HLD: -Continue lipitor  #Tobacco Use: -CT chest to monitor for lung cancer -Patient motivated to quit; currently trying the nicotine patches  #Low Back Pain: -Refer to Dr. Tamala Julian with [facility]  Medication Adjustments/Labs and Tests Ordered: Current medicines are reviewed at length with the patient today.  Concerns regarding medicines are outlined above.  No orders of the defined types were placed in this encounter.  No orders of the defined  types were placed in this encounter.   There are no Patient Instructions on file for this visit.    Follow-up in 6 months.  I,Mathew Stumpf,acting as a Education administrator for Freada Bergeron, MD.,have documented all relevant documentation on the behalf of Freada Bergeron, MD,as directed by  Freada Bergeron, MD while in the presence of Freada Bergeron, MD.  I, Freada Bergeron, MD, have reviewed all documentation for this visit. The documentation on 10/17/21 for the exam, diagnosis, procedures, and orders are all accurate and complete.  Signed, Freada Bergeron, MD  10/17/2021 2:11 PM    [place]

## 2021-10-18 DIAGNOSIS — I493 Ventricular premature depolarization: Secondary | ICD-10-CM | POA: Diagnosis not present

## 2021-10-18 DIAGNOSIS — I340 Nonrheumatic mitral (valve) insufficiency: Secondary | ICD-10-CM | POA: Diagnosis not present

## 2021-10-20 ENCOUNTER — Encounter: Payer: Self-pay | Admitting: Hematology and Oncology

## 2021-10-22 DIAGNOSIS — J9621 Acute and chronic respiratory failure with hypoxia: Secondary | ICD-10-CM | POA: Diagnosis not present

## 2021-10-22 DIAGNOSIS — Z79899 Other long term (current) drug therapy: Secondary | ICD-10-CM | POA: Diagnosis not present

## 2021-10-22 DIAGNOSIS — I509 Heart failure, unspecified: Secondary | ICD-10-CM | POA: Diagnosis not present

## 2021-10-23 ENCOUNTER — Ambulatory Visit: Payer: Medicare Other | Admitting: Cardiology

## 2021-10-25 ENCOUNTER — Other Ambulatory Visit: Payer: Self-pay | Admitting: Oncology

## 2021-10-25 DIAGNOSIS — G4700 Insomnia, unspecified: Secondary | ICD-10-CM

## 2021-10-25 DIAGNOSIS — J449 Chronic obstructive pulmonary disease, unspecified: Secondary | ICD-10-CM | POA: Diagnosis not present

## 2021-10-25 DIAGNOSIS — J9611 Chronic respiratory failure with hypoxia: Secondary | ICD-10-CM | POA: Diagnosis not present

## 2021-10-25 MED ORDER — ZOLPIDEM TARTRATE 5 MG PO TABS: ORAL_TABLET | ORAL | 0 refills | Status: DC

## 2021-10-28 DIAGNOSIS — M1990 Unspecified osteoarthritis, unspecified site: Secondary | ICD-10-CM | POA: Diagnosis not present

## 2021-10-28 DIAGNOSIS — I959 Hypotension, unspecified: Secondary | ICD-10-CM | POA: Diagnosis not present

## 2021-10-28 DIAGNOSIS — R933 Abnormal findings on diagnostic imaging of other parts of digestive tract: Secondary | ICD-10-CM | POA: Diagnosis not present

## 2021-10-28 DIAGNOSIS — J90 Pleural effusion, not elsewhere classified: Secondary | ICD-10-CM | POA: Diagnosis not present

## 2021-10-28 DIAGNOSIS — E860 Dehydration: Secondary | ICD-10-CM | POA: Diagnosis not present

## 2021-10-28 DIAGNOSIS — E7800 Pure hypercholesterolemia, unspecified: Secondary | ICD-10-CM | POA: Diagnosis not present

## 2021-10-28 DIAGNOSIS — J449 Chronic obstructive pulmonary disease, unspecified: Secondary | ICD-10-CM | POA: Diagnosis not present

## 2021-10-28 DIAGNOSIS — I110 Hypertensive heart disease with heart failure: Secondary | ICD-10-CM | POA: Diagnosis not present

## 2021-10-28 DIAGNOSIS — I2510 Atherosclerotic heart disease of native coronary artery without angina pectoris: Secondary | ICD-10-CM | POA: Diagnosis not present

## 2021-10-28 DIAGNOSIS — K429 Umbilical hernia without obstruction or gangrene: Secondary | ICD-10-CM | POA: Diagnosis not present

## 2021-10-28 DIAGNOSIS — K920 Hematemesis: Secondary | ICD-10-CM | POA: Diagnosis not present

## 2021-10-28 DIAGNOSIS — K562 Volvulus: Secondary | ICD-10-CM | POA: Diagnosis not present

## 2021-10-28 DIAGNOSIS — F32A Depression, unspecified: Secondary | ICD-10-CM | POA: Diagnosis not present

## 2021-10-28 DIAGNOSIS — Z923 Personal history of irradiation: Secondary | ICD-10-CM | POA: Diagnosis not present

## 2021-10-28 DIAGNOSIS — N179 Acute kidney failure, unspecified: Secondary | ICD-10-CM | POA: Diagnosis not present

## 2021-10-28 DIAGNOSIS — J439 Emphysema, unspecified: Secondary | ICD-10-CM | POA: Diagnosis not present

## 2021-10-28 DIAGNOSIS — I10 Essential (primary) hypertension: Secondary | ICD-10-CM | POA: Diagnosis not present

## 2021-10-28 DIAGNOSIS — R0789 Other chest pain: Secondary | ICD-10-CM | POA: Diagnosis not present

## 2021-10-28 DIAGNOSIS — Z9221 Personal history of antineoplastic chemotherapy: Secondary | ICD-10-CM | POA: Diagnosis not present

## 2021-10-28 DIAGNOSIS — R1013 Epigastric pain: Secondary | ICD-10-CM | POA: Diagnosis not present

## 2021-10-28 DIAGNOSIS — R112 Nausea with vomiting, unspecified: Secondary | ICD-10-CM | POA: Diagnosis not present

## 2021-10-28 DIAGNOSIS — Z87440 Personal history of urinary (tract) infections: Secondary | ICD-10-CM | POA: Diagnosis not present

## 2021-10-28 DIAGNOSIS — D509 Iron deficiency anemia, unspecified: Secondary | ICD-10-CM | POA: Diagnosis not present

## 2021-10-28 DIAGNOSIS — Z888 Allergy status to other drugs, medicaments and biological substances status: Secondary | ICD-10-CM | POA: Diagnosis not present

## 2021-10-28 DIAGNOSIS — D649 Anemia, unspecified: Secondary | ICD-10-CM | POA: Diagnosis not present

## 2021-10-28 DIAGNOSIS — J8410 Pulmonary fibrosis, unspecified: Secondary | ICD-10-CM | POA: Diagnosis not present

## 2021-10-28 DIAGNOSIS — R1011 Right upper quadrant pain: Secondary | ICD-10-CM | POA: Diagnosis not present

## 2021-10-28 DIAGNOSIS — I252 Old myocardial infarction: Secondary | ICD-10-CM | POA: Diagnosis not present

## 2021-10-28 DIAGNOSIS — K222 Esophageal obstruction: Secondary | ICD-10-CM | POA: Diagnosis not present

## 2021-10-28 DIAGNOSIS — Z85118 Personal history of other malignant neoplasm of bronchus and lung: Secondary | ICD-10-CM | POA: Diagnosis not present

## 2021-10-28 DIAGNOSIS — Z9981 Dependence on supplemental oxygen: Secondary | ICD-10-CM | POA: Diagnosis not present

## 2021-10-28 DIAGNOSIS — C3490 Malignant neoplasm of unspecified part of unspecified bronchus or lung: Secondary | ICD-10-CM | POA: Diagnosis not present

## 2021-10-28 DIAGNOSIS — K439 Ventral hernia without obstruction or gangrene: Secondary | ICD-10-CM | POA: Diagnosis not present

## 2021-10-28 DIAGNOSIS — K219 Gastro-esophageal reflux disease without esophagitis: Secondary | ICD-10-CM | POA: Diagnosis not present

## 2021-10-28 DIAGNOSIS — R079 Chest pain, unspecified: Secondary | ICD-10-CM | POA: Diagnosis not present

## 2021-10-28 DIAGNOSIS — Z955 Presence of coronary angioplasty implant and graft: Secondary | ICD-10-CM | POA: Diagnosis not present

## 2021-10-28 DIAGNOSIS — K6389 Other specified diseases of intestine: Secondary | ICD-10-CM | POA: Diagnosis not present

## 2021-10-28 DIAGNOSIS — Z8673 Personal history of transient ischemic attack (TIA), and cerebral infarction without residual deficits: Secondary | ICD-10-CM | POA: Diagnosis not present

## 2021-10-28 DIAGNOSIS — Z882 Allergy status to sulfonamides status: Secondary | ICD-10-CM | POA: Diagnosis not present

## 2021-10-28 DIAGNOSIS — Z87891 Personal history of nicotine dependence: Secondary | ICD-10-CM | POA: Diagnosis not present

## 2021-10-28 DIAGNOSIS — I5032 Chronic diastolic (congestive) heart failure: Secondary | ICD-10-CM | POA: Diagnosis not present

## 2021-10-28 DIAGNOSIS — I4891 Unspecified atrial fibrillation: Secondary | ICD-10-CM | POA: Diagnosis not present

## 2021-10-28 DIAGNOSIS — R110 Nausea: Secondary | ICD-10-CM | POA: Diagnosis not present

## 2021-10-29 DIAGNOSIS — K222 Esophageal obstruction: Secondary | ICD-10-CM | POA: Diagnosis not present

## 2021-10-29 DIAGNOSIS — K920 Hematemesis: Secondary | ICD-10-CM | POA: Diagnosis not present

## 2021-10-29 DIAGNOSIS — N179 Acute kidney failure, unspecified: Secondary | ICD-10-CM | POA: Diagnosis not present

## 2021-10-29 DIAGNOSIS — Z923 Personal history of irradiation: Secondary | ICD-10-CM | POA: Diagnosis not present

## 2021-10-29 DIAGNOSIS — R1011 Right upper quadrant pain: Secondary | ICD-10-CM | POA: Diagnosis not present

## 2021-10-29 DIAGNOSIS — E860 Dehydration: Secondary | ICD-10-CM | POA: Diagnosis not present

## 2021-10-29 DIAGNOSIS — R1310 Dysphagia, unspecified: Secondary | ICD-10-CM | POA: Diagnosis not present

## 2021-10-29 DIAGNOSIS — Z85118 Personal history of other malignant neoplasm of bronchus and lung: Secondary | ICD-10-CM | POA: Diagnosis not present

## 2021-10-29 DIAGNOSIS — I5032 Chronic diastolic (congestive) heart failure: Secondary | ICD-10-CM | POA: Diagnosis not present

## 2021-11-04 ENCOUNTER — Other Ambulatory Visit: Payer: Self-pay

## 2021-11-04 MED ORDER — METOPROLOL SUCCINATE ER 25 MG PO TB24: 25.0000 mg | ORAL_TABLET | Freq: Every day | ORAL | 0 refills | Status: DC

## 2021-11-05 DIAGNOSIS — R112 Nausea with vomiting, unspecified: Secondary | ICD-10-CM | POA: Diagnosis not present

## 2021-11-05 DIAGNOSIS — Z79899 Other long term (current) drug therapy: Secondary | ICD-10-CM | POA: Diagnosis not present

## 2021-11-05 DIAGNOSIS — D539 Nutritional anemia, unspecified: Secondary | ICD-10-CM | POA: Diagnosis not present

## 2021-11-05 DIAGNOSIS — K2100 Gastro-esophageal reflux disease with esophagitis, without bleeding: Secondary | ICD-10-CM | POA: Diagnosis not present

## 2021-11-05 DIAGNOSIS — R1013 Epigastric pain: Secondary | ICD-10-CM | POA: Diagnosis not present

## 2021-11-05 DIAGNOSIS — N179 Acute kidney failure, unspecified: Secondary | ICD-10-CM | POA: Diagnosis not present

## 2021-11-05 DIAGNOSIS — R531 Weakness: Secondary | ICD-10-CM | POA: Diagnosis not present

## 2021-11-06 ENCOUNTER — Telehealth: Payer: Self-pay

## 2021-11-06 NOTE — Patient Outreach (Signed)
  Care Coordination   Initial Visit Note   11/06/2021 Name: [name] MRN: [id number] DOB: 07-17-1940  [name] is [name] 81 y.o. year old female who sees Moon, Amy A, NP for primary care. I spoke with  [name] by phone today.  What matters to the patients health and wellness today?  Placed call to patient who reports that she is doing well. Reviewed [facility] care coordination program and patient declines. Reports she has already completed her  AWV.   SDOH assessments and interventions completed:  No     Care Coordination Interventions Activated:  No  Care Coordination Interventions:  No, not indicated   Follow up plan: No further intervention required.   Encounter Outcome:  Pt. Refused   [name] Rand, RN, BSN, CEN [facility] [employer] [phone]

## 2021-11-08 ENCOUNTER — Other Ambulatory Visit (HOSPITAL_BASED_OUTPATIENT_CLINIC_OR_DEPARTMENT_OTHER): Payer: Self-pay

## 2021-11-12 ENCOUNTER — Encounter: Payer: Self-pay | Admitting: Hematology and Oncology

## 2021-11-12 ENCOUNTER — Telehealth: Payer: Self-pay | Admitting: Oncology

## 2021-11-12 NOTE — Telephone Encounter (Signed)
CT Chest has been scheduled for 11/25/21 @ 2:30; Check in @ 1:30  Notified pt of date,time and instructions.

## 2021-11-18 NOTE — Progress Notes (Deleted)
Cardiology Office Note:    Date:  11/18/2021   ID:  LILYAUNA MIEDEMA, DOB 06/02/1940, MRN [id number]  PCP:  Lowella Dandy, NP   [facility] Providers Cardiologist:  Freada Bergeron, MD Electrophysiologist:  Constance Haw, MD { Click to update primary MD,subspecialty MD or APP then REFRESH:1}    Referring MD: Lowella Dandy, NP   Chief Complaint: ***  History of Present Illness:    [name] is a *** 81 y.o. female with a hx of CAD s/p PCI to Cx 2014, NSTEMI 12/2018, HTN, HLD, chronic HFpEF, tobacco use, PAD, CVA, carotid artery disease s/p right carotid endarterectomy 07/2014, iron deficiency anemia, and lung cancer.  CAD s/p PCI to Cx 2014.  May 2015 had SBO and required partial colon resection.  She underwent right carotid endarterectomy July 9798 complicated by postoperative stroke in the recovery room.  This resulted in left hemoaplasia.  NSTEMI 12/2018 with cardiac catheterization that revealed culprit distal total occlusion of obtuse marginal branch of left circumflex coronary artery.  Normal LM, LAD and mild nonobstructive 30% narrowing in the RCA.  LCx has 40% ostial stenosis in the OM 2 with a widely patent stent in the midportion of this marginal branch.  Distal marginal 100% occluded.  There is faint collateralization from the LAD to the distal vessel was just small caliber; AV groove circumflex has 30% narrowing in the stent in the AV groove circumflex is widely patent.  Severe global LV function with EF 55%. Medical management advised.   CT chest 05/2020 with new highly suspicious spiculated pulmonary nodule right lower lobe that was diagnosed as stage IIIb squamous cell lung cancer.  Last cardiology clinic visit was 10/29/2020 with Laurann Montana, NP at which time she had completed radiation and 6 cycles of chemotherapy.  She was hospitalized August 2022 with COVID-19.  No changes made to her treatment plan and she was advised to follow-up in 6  months.  Today, she is here   Past Medical History:  Diagnosis Date   Acute respiratory failure with hypoxia s/p tracheostomy 07/20/2013   Anemia    a. 7-08/2013 felt due to recent critical illness/chronic disease.   Arthritis    handds, knees & back    B12 deficiency 11/14/2020   Barrett's esophagus    CAD (coronary artery disease)    a. Mod dz 2010 initially mgd medically. b. 12/2012 - angina s/p PTCA/DES to mid-circumflex, PTCA/DES to first OM.    Cancer [facility])    Carotid artery disease ([place])    a. 60-70% bilat ICA stenosis by dopplers 05/2012.   Chronic back pain    deteriorating;DDD   Colonic obstruction due to suspected colitis; s/p colectomy/colostomy    a. See SBO.   Complication of anesthesia    hard to wake up from anesthesia    COPD (chronic obstructive pulmonary disease) (HCC)    Depression    takes Remeron and Zoloft daily   Dizziness    was on Bentyl which caused this-took off of it and no problems since   DVT of axillary vein, acute right ([place])    a. Dx 07/2013 felt due to R IJ central line that had been inserted 7/14. Not on anticoag due to GIB issues and small cerebral bleed.   GERD (gastroesophageal reflux disease)    Heart murmur    History of bronchitis    > 26yrs ago    History of colon polyps    History of  hiatal hernia    History of kidney stones    was told it was stable but doesn't know for sure that she ever passed it   HTN (hypertension)    Hypertension    takes Metoprolol daily   Intracranial hemorrhage ([place])    a. 08/11/13 - per DC summary, tiny SAH vs SDH done for altered mentation, anticoag stopped including aspirin.   Joint pain    Neck pain    DDD   Other and unspecified hyperlipidemia    takes Lipitor daily   Pneumonia    hx of->15 yrs ago   Protein-calorie malnutrition, severe w/ electrolyte imbalance    a. Severe hypoalbuminemia leading to 3rd spacing including anasarca and pulm edema 07/2013.   SBO (small bowel obstruction) ([place])     a. Lysis of adhesions and ovarian cystectomy 04/2013 for sBO. b. Admitted 07/2013 for colonic obstruction due to suspected colitis, s/p partial colectomy/colostomy 08/01/13. Admission complicated by anasarca, acute resp failure requiring tracheostomy, decannulated 08/25/13. c. Recurrent SBO8/2015, NGT placed for decompression.    Stroke [facility]) early 80's   right sided weakness--TIA    Past Surgical History:  Procedure Laterality Date   ABDOMINAL HYSTERECTOMY     APPENDECTOMY     BOWEL RESECTION N/A 08/01/2013   Procedure: SMALL BOWEL RESECTION;  Surgeon: Harl Bowie, MD;  Location: [place];  Service: General;  Laterality: N/A;   BRONCHIAL BIOPSY  07/02/2020   Procedure: BRONCHIAL BIOPSIES;  Surgeon: Collene Gobble, MD;  Location: [facility] ENDOSCOPY;  Service: Pulmonary;;   BRONCHIAL BRUSHINGS  07/02/2020   Procedure: BRONCHIAL BRUSHINGS;  Surgeon: Collene Gobble, MD;  Location: [facility] ENDOSCOPY;  Service: Pulmonary;;   BRONCHIAL NEEDLE ASPIRATION BIOPSY  07/02/2020   Procedure: BRONCHIAL NEEDLE ASPIRATION BIOPSIES;  Surgeon: Collene Gobble, MD;  Location: [place];  Service: Pulmonary;;   BRONCHIAL WASHINGS  07/02/2020   Procedure: BRONCHIAL WASHINGS;  Surgeon: Collene Gobble, MD;  Location: [place] ENDOSCOPY;  Service: Pulmonary;;   cataract surgery Bilateral    CHOLECYSTECTOMY     COLON RESECTION     COLON SURGERY     COLOSTOMY N/A 08/01/2013   Procedure: COLOSTOMY;  Surgeon: Harl Bowie, MD;  Location: [place];  Service: General;  Laterality: N/A;   COLOSTOMY TAKEDOWN  01/24/2014   dr blackman   COLOSTOMY TAKEDOWN N/A 01/24/2014   Procedure: COLOSTOMY TAKEDOWN;  Surgeon: Coralie Keens, MD;  Location: [place];  Service: General;  Laterality: N/A;   CORONARY ANGIOPLASTY WITH STENT PLACEMENT  12/20/2012   STENT TO OM         DR COOPER   ENDARTERECTOMY Right 08/08/2014   Procedure: RIGHT CAROTID ENDARTERECTOMY WITH HEMASHIELD PATCH ANGIOPLASTY;  Surgeon: Elam Dutch, MD;  Location: [place];   Service: Vascular;  Laterality: Right;   FIDUCIAL MARKER PLACEMENT  07/02/2020   Procedure: FIDUCIAL MARKER PLACEMENT;  Surgeon: Collene Gobble, MD;  Location: [place] ENDOSCOPY;  Service: Pulmonary;;   FOOT SURGERY Left    HAND SURGERY Bilateral    for ganglion cysts   HEMOSTASIS CONTROL  07/02/2020   Procedure: HEMOSTASIS CONTROL;  Surgeon: Collene Gobble, MD;  Location: [place] ENDOSCOPY;  Service: Pulmonary;;  ice saline and epi   IR IMAGING GUIDED PORT INSERTION  08/23/2020   LAPAROTOMY N/A 08/01/2013   Procedure: EXPLORATORY LAPAROTOMY;  Surgeon: Harl Bowie, MD;  Location: [place];  Service: General;  Laterality: N/A;   LEFT HEART CATH AND CORONARY ANGIOGRAPHY N/A 01/10/2019   Procedure:  LEFT HEART CATH AND CORONARY ANGIOGRAPHY;  Surgeon: Troy Sine, MD;  Location: [place] CV LAB;  Service: Cardiovascular;  Laterality: N/A;   LEFT HEART CATHETERIZATION WITH CORONARY ANGIOGRAM N/A 12/20/2012   Procedure: LEFT HEART CATHETERIZATION WITH CORONARY ANGIOGRAM;  Surgeon: Blane Ohara, MD;  Location: [facility] CATH LAB;  Service: Cardiovascular;  Laterality: N/A;   PARTIAL COLECTOMY N/A 08/01/2013   Procedure: PARTIAL COLECTOMY;  Surgeon: Harl Bowie, MD;  Location: [place];  Service: General;  Laterality: N/A;   ROTATOR CUFF REPAIR Left    TONSILLECTOMY     TRACHEOSTOMY     feinstein   VIDEO BRONCHOSCOPY WITH ENDOBRONCHIAL NAVIGATION N/A 07/02/2020   Procedure: VIDEO BRONCHOSCOPY WITH ENDOBRONCHIAL NAVIGATION;  Surgeon: Collene Gobble, MD;  Location: [place] ENDOSCOPY;  Service: Pulmonary;  Laterality: N/A;    Current Medications: No outpatient medications have been marked as taking for the 11/19/21 encounter (Appointment) with Ann Maki, Lanice Schwab, NP.     Allergies:   Pregabalin, Paclitaxel, and Sulfonamide derivatives   Social History   Socioeconomic History   Marital status: Married    Spouse name: Not on file   Number of children: Not on file   Years of education: Not on file    Highest education level: Not on file  Occupational History   Occupation: full time  Tobacco Use   Smoking status: Former    Packs/day: 1.00    Years: 60.00    Total pack years: 60.00    Types: Cigarettes    Quit date: 06/2020    Years since quitting: 1.4   Smokeless tobacco: Never   Tobacco comments:    Stopped smoking in May 2022. ARJ 08/10/20  Vaping Use   Vaping Use: Never used  Substance and Sexual Activity   Alcohol use: No    Alcohol/week: 0.0 standard drinks of alcohol   Drug use: No   Sexual activity: Not Currently    Birth control/protection: Surgical    Comment: Hysterectomy  Other Topics Concern   Not on file  Social History Narrative   Not on file   Social Determinants of Health   Financial Resource Strain: Not on file  Food Insecurity: Not on file  Transportation Needs: Not on file  Physical Activity: Not on file  Stress: Not on file  Social Connections: Not on file     Family History: The patient's ***family history includes AAA (abdominal aortic aneurysm) in her father; Heart attack in her father; Heart disease in her father and another family member; Varicose Veins in her mother.  ROS:   Please see the history of present illness.    *** All other systems reviewed and are negative.  Labs/Other Studies Reviewed:    The following studies were reviewed today:  Echo ([place]) 10/18/21  LVEF 94-85%, diastolic dysfunction Mildly dilated LA Mildly elevated PASP at 43% Mild MR, mild TR  Carotid Duplex 11/14/20  Right Carotid: Velocities in the right ICA are consistent with a 1-39%  stenosis.   Left Carotid: Velocities in the left ICA are consistent with a 60-79%  stenosis.   Vertebrals: Bilateral vertebral arteries demonstrate antegrade flow.  Subclavians: Normal flow hemodynamics were seen in bilateral subclavian               arteries.   *See table(s) above for measurements and observations.     LHC 01/10/2019  Previously placed Mid Cx  to Dist Cx stent (unknown type) is widely patent. Mid Cx lesion is 30% stenosed.  2nd Mrg-1 lesion is 40% stenosed. Previously placed 2nd Mrg-2 stent (unknown type) is widely patent. 2nd Mrg-3 lesion is 100% stenosed. Prox RCA lesion is 30% stenosed. The left ventricular systolic function is normal. LV end diastolic pressure is mildly elevated.   Non-ST segment elevation myocardial infarction secondary to distal total occlusion of the obtuse marginal branch of the left circumflex coronary artery,   Normal left main, LAD and mild nonobstructive 30% narrowing in the RCA which supplies the posterior wall.   The left circumflex vessel has 40% ostial stenosis in the OM 2 vessel with a widely patent stent in the midportion of this marginal branch.  The distal marginal is 100% occluded.  There is faint collateralization from the LAD to the distal vessel which is small caliber; the AV groove circumflex has 30% narrowing in the stent in the AV groove circumflex is widely patent.   Serve global LV function with EF estimated 55%.  There is a subtle focal area of distal inferior hypocontractility.   RECOMMENDATION: The patient's ECG is entirely normal.  She is not having any recurrent chest pain.  Her MI was over 48 hours ago.  With her stability and distal occlusion recommend medical management.  Consider DAPT; however, will need to obtain additional information with her history of prior stroke following carotid endarterectomy remotely.  Alternatively aspirin alone and medical therapy for her current obstructive disease.  Smoking cessation is essential.  Aggressive lipid-lowering therapy with target LDL less than 70.    Diagnostic Dominance: Co-dominant     Recent Labs: 04/25/2021: TSH 1.435 08/23/2021: ALT 23; BUN 18; Creatinine 0.8; Hemoglobin 10.8; Platelets 171; Potassium 3.2; Sodium 141  Recent Lipid Panel    Component Value Date/Time   CHOL 117 01/09/2019 0053   CHOL 150 01/30/2016 1052    TRIG 86 01/09/2019 0053   HDL 42 01/09/2019 0053   HDL 38 (L) 01/30/2016 1052   CHOLHDL 2.8 01/09/2019 0053   VLDL 17 01/09/2019 0053   LDLCALC 58 01/09/2019 0053   LDLCALC 84 01/30/2016 1052     Risk Assessment/Calculations:   {Does this patient have ATRIAL FIBRILLATION?:[phone]}       Physical Exam:    VS:  There were no vitals taken for this visit.    Wt Readings from Last 3 Encounters:  08/23/21 131 lb 3.2 oz (59.5 kg)  05/23/21 132 lb 12.8 oz (60.2 kg)  03/25/21 132 lb (59.9 kg)     GEN: *** Well nourished, well developed in no acute distress HEENT: Normal NECK: No JVD; No carotid bruits CARDIAC: ***RRR, no murmurs, rubs, gallops RESPIRATORY:  Clear to auscultation without rales, wheezing or rhonchi  ABDOMEN: Soft, non-tender, non-distended MUSCULOSKELETAL:  No edema; No deformity. *** pedal pulses, ***bilaterally SKIN: Warm and dry NEUROLOGIC:  Alert and oriented x 3 PSYCHIATRIC:  Normal affect   EKG:  EKG is *** ordered today.  The ekg ordered today demonstrates ***  No BP recorded.  {Refresh Note OR Click here to enter BP  :1}***    Diagnoses:    No diagnosis found. Assessment and Plan:     CAD without angina: Hyperlipidemia LDL goal < 70: Hypertension: History of stroke: Carotid artery stenosis s/p right CEA: Recommendation from [facility] to follow-up in 6 months due to increased left ICA velocities  {Are you ordering a CV Procedure (e.g. stress test, cath, DCCV, TEE, etc)?   Press F2        :[id number]}   Disposition:  Medication Adjustments/Labs and Tests  Ordered: Current medicines are reviewed at length with the patient today.  Concerns regarding medicines are outlined above.  No orders of the defined types were placed in this encounter.  No orders of the defined types were placed in this encounter.   There are no Patient Instructions on file for this visit.   Signed, Emmaline Life, NP  11/18/2021 3:06 PM    [place]

## 2021-11-19 ENCOUNTER — Ambulatory Visit: Payer: Medicare Other | Admitting: Nurse Practitioner

## 2021-11-19 DIAGNOSIS — R531 Weakness: Secondary | ICD-10-CM | POA: Diagnosis not present

## 2021-11-19 DIAGNOSIS — I509 Heart failure, unspecified: Secondary | ICD-10-CM | POA: Diagnosis not present

## 2021-11-19 DIAGNOSIS — J90 Pleural effusion, not elsewhere classified: Secondary | ICD-10-CM | POA: Diagnosis not present

## 2021-11-22 ENCOUNTER — Other Ambulatory Visit: Payer: Self-pay

## 2021-11-22 ENCOUNTER — Other Ambulatory Visit (HOSPITAL_BASED_OUTPATIENT_CLINIC_OR_DEPARTMENT_OTHER): Payer: Self-pay

## 2021-11-22 DIAGNOSIS — G4700 Insomnia, unspecified: Secondary | ICD-10-CM

## 2021-11-22 MED ORDER — ZOLPIDEM TARTRATE 5 MG PO TABS: ORAL_TABLET | ORAL | 0 refills | Status: DC

## 2021-11-22 NOTE — Telephone Encounter (Signed)
Received fax from [facility] requesting refills for Metoprolol Succinate 25 mg.   Pt of Dr. Johney Frame. Routing to [facility] refill pool to review for refill.

## 2021-11-25 ENCOUNTER — Other Ambulatory Visit: Payer: Self-pay

## 2021-11-25 ENCOUNTER — Encounter: Payer: Self-pay | Admitting: Hematology and Oncology

## 2021-11-25 DIAGNOSIS — J439 Emphysema, unspecified: Secondary | ICD-10-CM | POA: Diagnosis not present

## 2021-11-25 DIAGNOSIS — C3490 Malignant neoplasm of unspecified part of unspecified bronchus or lung: Secondary | ICD-10-CM | POA: Diagnosis not present

## 2021-11-25 DIAGNOSIS — C3431 Malignant neoplasm of lower lobe, right bronchus or lung: Secondary | ICD-10-CM | POA: Diagnosis not present

## 2021-11-25 DIAGNOSIS — J9611 Chronic respiratory failure with hypoxia: Secondary | ICD-10-CM | POA: Diagnosis not present

## 2021-11-25 DIAGNOSIS — J449 Chronic obstructive pulmonary disease, unspecified: Secondary | ICD-10-CM | POA: Diagnosis not present

## 2021-11-25 DIAGNOSIS — J90 Pleural effusion, not elsewhere classified: Secondary | ICD-10-CM | POA: Diagnosis not present

## 2021-11-25 DIAGNOSIS — G4700 Insomnia, unspecified: Secondary | ICD-10-CM

## 2021-11-25 LAB — BASIC METABOLIC PANEL
BUN: 22 — AB (ref 4–21)
CO2: 21 (ref 13–22)
Chloride: 105 (ref 99–108)
Creatinine: 1.3 — AB (ref 0.5–1.1)
Glucose: 95
Potassium: 3.8 mEq/L (ref 3.5–5.1)
Sodium: 135 — AB (ref 137–147)

## 2021-11-25 LAB — CBC AND DIFFERENTIAL
HCT: 34 — AB (ref 36–46)
Hemoglobin: 11.2 — AB (ref 12.0–16.0)
Platelets: 183 10*3/uL (ref 150–400)
WBC: 6.4

## 2021-11-25 LAB — HEPATIC FUNCTION PANEL
ALT: 19 U/L (ref 7–35)
AST: 28 (ref 13–35)
Alkaline Phosphatase: 87 (ref 25–125)
Bilirubin, Total: 0.6

## 2021-11-25 LAB — COMPREHENSIVE METABOLIC PANEL
Albumin: 4 (ref 3.5–5.0)
Calcium: 9.1 (ref 8.7–10.7)

## 2021-11-25 LAB — CBC: RBC: 3.81 — AB (ref 3.87–5.11)

## 2021-11-25 MED ORDER — ZOLPIDEM TARTRATE 5 MG PO TABS: ORAL_TABLET | ORAL | 0 refills | Status: DC

## 2021-11-25 MED ORDER — METOPROLOL SUCCINATE ER 25 MG PO TB24: 25.0000 mg | ORAL_TABLET | Freq: Every day | ORAL | 0 refills | Status: DC

## 2021-11-27 ENCOUNTER — Other Ambulatory Visit: Payer: Self-pay

## 2021-11-27 ENCOUNTER — Encounter: Payer: Self-pay | Admitting: Oncology

## 2021-11-27 ENCOUNTER — Inpatient Hospital Stay: Payer: Medicare Other | Attending: Internal Medicine | Admitting: Oncology

## 2021-11-27 VITALS — BP 122/60 | HR 101 | Temp 98.5°F | Resp 20 | Ht 59.0 in | Wt 135.5 lb

## 2021-11-27 DIAGNOSIS — C3431 Malignant neoplasm of lower lobe, right bronchus or lung: Secondary | ICD-10-CM | POA: Diagnosis not present

## 2021-11-27 NOTE — Progress Notes (Addendum)
[facility]  [address] [place],  [place]  [address] [phone]  Clinic Day: 11/27/21  Referring physician: Lowella Dandy, NP  ASSESSMENT & PLAN:   Assessment & Plan:  Stage IIIB (T1b N3 M0) squamous cell lung cancer, diagnosed in June 2022.  She received concurrent chemoradiation and completed 6 cycles of carboplatin/Abraxane.  I do not feel she needs further chemotherapy at this time.  She completed radiation therapy on September 6th, and CT imaging has shown a good response. She started maintenance immunotherapy with durvalumab for 1 year on October 11th and only received 2 cycles. Her last dose of immunotherapy was on October 27th 2022. She had a prolonged hospitalization after COVID pneumonia and was never able to resume therapy. CT imaging from February 2023 revealed no evidence of progressive disease. Her latest scan from 11/25/2021 remains stable with emphysema. She has post treatment changes in the lungs but no evidence of recurrent or metastatic disease. She does have a small loculated pleural effusion. Her CEA has decreased from 7.1 to 5.9.   2.  COVID infection, August 2022, requiring hospitalization for pneumonia from November 8th to December 1st.   3.  Osteopenia.  She is up to date on bone density scans as of April 2022, which are scheduled through Dr. Delena Bali office.  4.  Anemia, consistent with iron deficiency. She is taking PO iron once weekly. HGB of 11.2  in November 2023.  5.  Hypokalemia.  Taking Potassium supplementation x2 BID. Last potassium was 3.8 in November 2023.  6. Kidney failure. Creatinine of 1.30 in November 2023.   She continues on surveillance only.  She may continue supplemental oxygen as needed, she is using this at night primarily. Her scan looks good and her CEA has decreased so we feel her lung cancer is under control. We will plan to see her back in 3 months with CBC, CMP, and CEA for repeat evaluation. We will  then repeat CT chest in 6 months. She understands and agrees with this plan of care. I have answered her questions and she knows to call with any concerns.   I provided 20 minutes of face-to-face time during this this encounter and > 50% was spent counseling as documented under my assessment and plan.     Derwood Kaplan, MD  [facility] [address] [place] [address] Dept: [phone] Dept Fax: [phone]    CHIEF COMPLAINT:  CC: Stage IIIB squamous cell lung cancer  Current Treatment: Surveillance   HISTORY OF PRESENT ILLNESS:  Stage IIIB (T1b, N3, M0) squamous cell lung cancer diagnosed in June 2022.  This began when the patient started to experience intermittent chest pain and shortness of breath, and followed up with cardiology for further evaluation.  Chest x-ray was abnormal.  Due to her history of COPD and long history of smoking she was referred to pulmonology.  CT chest from May revealed a new highly suspicious spiculated pulmonary nodule within the superior lateral aspect of the right lower lobe measuring 1.5 x 1.0 x 1.4 cm with central small cavitation highly suspicious to be a neoplastic nodule.  There was also nodularity along the upper wall of the left mainstem bronchus of uncertain etiology but also suspicious for additional neoplastic process.  CT super D of the chest from June revealed enlargement of the thick-walled spiculated cavitary nodule in the superior segment of the right lower lobe measuring  1.6 x 1.6.  There was also numerous mediastinal lymph nodes some of which are enlarged including high AP window lymph node measuring 1.2 cm, lower right paratracheal lymph node measuring 1.4 cm. Final pathology confirmed malignant cells consistent with non-small cell carcinoma. Overall, the morphology and immunophenotype favor a squamous cell carcinoma.  Staging PET scan showed the hypermetabolic right  lower lobe lung lesion consistent with primary lung neoplasm.  There was also mediastinal and hilar lymphadenopathy but no findings to suggest abdominal/pelvic metastatic disease or osseous metastatic disease for a T1b N3 M0, stage IIIB.  MRI brain was negative for metastasis.  She was referred to Dr. Julien Nordmann, oncology, and was started concurrent chemoradiation with weekly carboplatin/paclitaxel.  Her 1st cycle was July 11th.  With the 3rd cycle, she developed a reaction to paclitaxel and so was switched to Abraxane.  She has received 6 cycles of therapy.    She has a medical history significant for coronary artery disease status post stent placement, carotid artery disease, colon obstruction status post surgical intervention, depression, COPD, DVT, GERD, intracranial hemorrhage, hypertension, strokes, anemia and Barrett's esophagus. [name] states that she has quit smoking since her diagnosis in 07-06-2022 after 60 pack years.  She contracted COVID back in August of 2022 and was quite ill and required hospitalization for 5 days.  She was readmitted in November and spent months in the hospital and rehab before returning home, so we never resumed immunotherapy. She just had the 2 doses in October of 2022.  Her husband expired in 04-04-2021 and then her daughter died of a MVA in 07-06-2022. She is understandably depressed and feels that she is going through this alone now.  She has had insomnia, which has improved with Ambien.  She does admit to depression as she is still grieving, and has not been able to continue her normal activities.  Oncology History  Malignant neoplasm of bronchus of right lower lobe ([place])  07/10/2020 Initial Diagnosis   Stage III squamous cell carcinoma of right lung ([facility])   07/10/2020 Cancer Staging   Staging form: Lung, AJCC 8th Edition - Clinical: Stage IIIB (cT1b, cN3, cM0) - Signed by Curt Bears, MD on 07/10/2020   07/30/2020 - 09/17/2020 Chemotherapy         10/25/2020 Imaging   CT  chest: 1. Interval decrease in size of the right lower lobe lung lesion.  2. Progressive airspace opacity and atelectasis in the left upper  lobe.  3. Persistent small left effusion and left lower lobe atelectasis or  infiltrate.  4. New 15.5 mm sub solid nodular lesion in the right upper lobe just above the minor fissure. It is possible this is an area of  inflammation but attention on future studies is suggested. Recommend follow-up noncontrast chest CT in 3-4 months.  5. Stable emphysematous changes and pulmonary scarring.  6. Slightly smaller borderline enlarged mediastinal and hilar lymph  nodes.  7. Stable atherosclerotic calcifications involving the aorta and  branch vessels including three-vessel coronary artery calcifications.    10/30/2020 - 11/15/2020 Chemotherapy   Patient is on Treatment Plan : LUNG Durvalumab q14d        INTERVAL HISTORY:  Emmilynn is here for follow up. She was hospitalized in September for treatment of pneumonia and respiratory failure. She was again hospitalized in October for kidney failure secondary to severe projectile emesis. Her creatinine was 1.4 in the hospital and dropped to 0.9 by the day of discharge in October. She is on Lasix 40mg   daily and I will defer to [facility] regarding any adjustments. She stopped the iron supplement due to constipation alternating with diarrhea so I suggested she try 1 weekly since her hemoglobin is improving. Hemoglobin is better at 11.2 today. Her creatinine is 1.3 today. Her CEA has decreased from 7.1 to 5.9. We will send copies of her labs to [facility]. Her  appetite is fair, and her weight is down 1 pound since her last visit.  She denies fever, chills or other signs of infection. She denies nausea, vomiting, bowel issues, or abdominal pain.  She denies sore throat, cough, dyspnea, or chest pain.  REVIEW OF SYSTEMS:  Review of Systems  Constitutional:  Negative for appetite change, chills, fever and unexpected weight change.   HENT:  Negative.  Negative for lump/mass, mouth sores and sore throat.   Eyes: Negative.   Respiratory: Negative.  Negative for chest tightness, cough, hemoptysis, shortness of breath and wheezing.   Cardiovascular: Negative.  Negative for chest pain, leg swelling and palpitations.  Gastrointestinal: Negative.  Negative for abdominal distention, abdominal pain, blood in stool, constipation, diarrhea, nausea and vomiting.  Endocrine: Negative.   Genitourinary: Negative.  Negative for difficulty urinating, dysuria, frequency and hematuria.   Musculoskeletal:  Positive for back pain (chronic spasms). Negative for arthralgias, flank pain and gait problem.  Skin: Negative.   Neurological:  Negative for dizziness, extremity weakness, gait problem, headaches, light-headedness, numbness, seizures and speech difficulty.  Hematological:  Negative for adenopathy. Bruises/bleeds easily.  Psychiatric/Behavioral: Negative.  Negative for depression and sleep disturbance. The patient is not nervous/anxious.      VITALS:  Blood pressure 122/60, pulse (!) 101, temperature 98.5 F (36.9 C), temperature source Oral, resp. rate 20, height 4\' 11"  (1.499 m), weight 135 lb 8 oz (61.5 kg), SpO2 95 %.  Wt Readings from Last 3 Encounters:  11/27/21 135 lb 8 oz (61.5 kg)  08/23/21 131 lb 3.2 oz (59.5 kg)  05/23/21 132 lb 12.8 oz (60.2 kg)    Body mass index is 27.37 kg/m.  Performance status (ECOG): 1 - Symptomatic but completely ambulatory  PHYSICAL EXAM:  Physical Exam Constitutional:      General: She is not in acute distress.    Appearance: Normal appearance. She is normal weight.  HENT:     Head: Normocephalic and atraumatic.  Eyes:     General: No scleral icterus.    Extraocular Movements: Extraocular movements intact.     Conjunctiva/sclera: Conjunctivae normal.     Pupils: Pupils are equal, round, and reactive to light.  Cardiovascular:     Rate and Rhythm: Normal rate and regular rhythm.      Pulses: Normal pulses.     Heart sounds: Normal heart sounds. No murmur heard.    No friction rub. No gallop.  Pulmonary:     Effort: Pulmonary effort is normal. No respiratory distress.     Breath sounds: Normal breath sounds.  Abdominal:     General: Bowel sounds are normal. There is no distension.     Palpations: Abdomen is soft. There is no hepatomegaly, splenomegaly or mass.     Tenderness: There is no abdominal tenderness.  Musculoskeletal:        General: Normal range of motion.     Cervical back: Normal range of motion and neck supple.     Right lower leg: No edema.     Left lower leg: No edema.  Lymphadenopathy:     Cervical: No cervical adenopathy.  Skin:  General: Skin is warm and dry.  Neurological:     General: No focal deficit present.     Mental Status: She is alert and oriented to person, place, and time. Mental status is at baseline.  Psychiatric:        Mood and Affect: Mood normal.        Behavior: Behavior normal.        Thought Content: Thought content normal.        Judgment: Judgment normal.     LABS:      Latest Ref Rng & Units 11/25/2021   12:00 AM 08/23/2021   12:00 AM 05/23/2021   12:00 AM  CBC  WBC  6.4     6.0     5.3      Hemoglobin 12.0 - 16.0 11.2     10.8     10.5      Hematocrit 36 - 46 34     33     33      Platelets 150 - 400 K/uL 183     171     177         This result is from an external source.      Latest Ref Rng & Units 11/25/2021   12:00 AM 08/23/2021   12:00 AM 05/23/2021   12:00 AM  CMP  BUN 4 - 21 22     18     20       Creatinine 0.5 - 1.1 1.3     0.8     0.9      Sodium 137 - 147 135     141     144      Potassium 3.5 - 5.1 mEq/L 3.8     3.2     3.3      Chloride 99 - 108 105     109     109      CO2 13 - 22 21     22     22       Calcium 8.7 - 10.7 9.1     9.4     9.2      Alkaline Phos 25 - 125 87     70     77      AST 13 - 35 28     25     26       ALT 7 - 35 U/L 19     23     19          This result is from an  external source.     Lab Results  Component Value Date   CEA1 5.9 (H) 08/23/2021   CEA 5.9 11/25/2021   /  CEA  Date Value Ref Range Status  11/25/2021 5.9  Final  08/23/2021 5.9 (H) 0.0 - 4.7 ng/mL Final    Comment:    (NOTE)                             Nonsmokers          <3.9                             Smokers             <5.6 [employer] Electrochemiluminescence Immunoassay (ECLIA) Values obtained with different assay methods or kits cannot be used interchangeably.  Results cannot be interpreted as absolute evidence of the presence or absence of malignant disease. Performed At: [facility] [place], [place] [id number] Rush Farmer MD [id number]     Lab Results  Component Value Date   TIBC 422 04/25/2021   TIBC 417 11/13/2020   FERRITIN 42 04/25/2021   FERRITIN 29 11/13/2020   IRONPCTSAT 13 04/25/2021   IRONPCTSAT 22 11/13/2020   IRONPCTSAT 7.7 (L) 06/19/2020   Lab Results  Component Value Date   LDH 166 06/19/2020    STUDIES:   CT chest with contrast 11/25/2021 Impression: Post treatment changes in the lung bilaterally with a small loculated right pleural effusion, stable. No evidence of recurrent or metastatic disease. Aortic atherosclerosis. Coronary artery calcification. Enlarged pulmonic trunk, indicative of pulmonary arterial hypertension.  Emphysema.  EXAM: 05/20/21 CT CHEST WITH CONTRAST  TECHNIQUE:  Multidetector CT imaging of the chest was performed during  intravenous contrast administration.  RADIATION DOSE REDUCTION: This exam was performed according to the  departmental dose-optimization program which includes automated  exposure control, adjustment of the mA and/or kV according to  patient size and/or use of iterative reconstruction technique.  CONTRAST: 60 cc Isovue 370  COMPARISON: Direct comparison 03/18/2021 diagnostic chest CT.  Secondary comparison to the interval 02/28/2021 CTA chest   FINDINGS:   Cardiovascular: Right Port-A-Cath tip high right atrium. Aortic  atherosclerosis. Mild cardiomegaly. Left main and 3 vessel coronary  artery calcification. Trace pericardial fluid is likely physiologic.  Pulmonary artery enlargement, outflow tract 3.2 cm. No central  pulmonary embolism, on this non-dedicated study.  Mediastinum/Nodes: No supraclavicular adenopathy. 9 mm pretracheal  node on 39/2 measures 11 mm on the prior. No hilar adenopathy. Tiny  hiatal hernia. Small prevascular nodes are unchanged.  Lungs/Pleura: Small, right larger than left pleural effusions.  Slightly decreased compared to 02/26/2021.  Moderate centrilobular emphysema.  Again identified is radiation induced consolidation within the  superior segment right lower lobe, with adjacent fiducials. This  presumably obscures the known right lower lobe neoplasm.  Significantly improved aeration throughout the remainder of the  lungs. The only remaining area of mild airspace disease with  suggestion of architectural distortion is seen within the left upper  lobe including on 46/301.  Upper Abdomen: Normal imaged portions of the liver, spleen, adrenal  glands, kidneys.  Musculoskeletal: Lower anterior right rib remote fractures.   IMPRESSION:  1. Redemonstration of radiation induced consolidation within the  superior segment right lower lobe. No specific findings of  recurrent/metastatic disease.  2. Since 02/26/2021, significantly improved aeration, most  consistent with resolved infection/inflammation. Favor developing  post infectious/inflammatory scarring within the left upper lobe.  3. Small mediastinal nodes, including decreased size of a dominant  node, nonspecific.  4. Decreased small bilateral pleural effusions.  5. Aortic atherosclerosis (ICD10-I70.0), coronary artery  atherosclerosis and emphysema (ICD10-J43.9).  6. Tiny hiatal hernia.  7. Pulmonary artery enlargement suggests pulmonary arterial   hypertension.   Electronically Signed  By: Abigail Miyamoto M.D.  On: 05/21/2021 08:55    HISTORY:   Allergies:  Allergies  Allergen Reactions   Pregabalin Swelling    Tongue swelling   Paclitaxel    Sulfonamide Derivatives Swelling    Childhood reaction    Current Medications: Current Outpatient Medications  Medication Sig Dispense Refill   aspirin EC 81 MG tablet Take 2 tablets (162 mg total) by mouth in the morning. Okay to restart this medication on 07/04/2020 30 tablet 11   atorvastatin (LIPITOR) 80 MG tablet  Take 80 mg by mouth every evening.      budesonide (PULMICORT) 0.5 MG/2ML nebulizer solution Take by nebulization.     buPROPion (WELLBUTRIN XL) 150 MG 24 hr tablet Take 1 tablet (150 mg total) by mouth daily. 90 tablet 3   DULoxetine (CYMBALTA) 60 MG capsule Take 60 mg by mouth daily.     ferrous sulfate 324 MG TBEC Take 324 mg by mouth daily with breakfast.     fish oil-omega-3 fatty acids 1000 MG capsule Take 1,000 mg by mouth in the morning.     folic acid (FOLVITE) 622 MCG tablet Take 800 mcg by mouth in the morning.     ipratropium-albuterol (DUONEB) 0.5-2.5 (3) MG/3ML SOLN USE 1 VIAL VIA NEBULIZER EVERY 6 HOURS AS NEEDED 360 mL 11   metoprolol succinate (TOPROL-XL) 25 MG 24 hr tablet Take 1 tablet (25 mg total) by mouth daily. (Patient taking differently: Take 25 mg by mouth daily. 1 1/2) 15 tablet 0   mirtazapine (REMERON) 45 MG tablet Take 45 mg by mouth at bedtime.      Multiple Vitamins-Minerals (CENTRUM ADULTS PO) Take by mouth. Centrum silver     nitroGLYCERIN (NITROSTAT) 0.4 MG SL tablet DISSOLVE 1 TABLET UNDER THE TONGUE EVERY 5 MINUTES AS  NEEDED FOR CHEST PAIN. MAX  OF 3 TABLETS IN 15 MINUTES. CALL 911 IF PAIN PERSISTS. 100 tablet 3   ondansetron (ZOFRAN) 4 MG tablet Take 4 mg by mouth every 8 (eight) hours as needed for nausea or vomiting.     pantoprazole (PROTONIX) 40 MG tablet Take 40 mg by mouth in the morning.     potassium chloride SA (KLOR-CON M) 20  MEQ tablet Take 1 tablet (20 mEq total) by mouth 2 (two) times daily. 60 tablet 5   zolpidem (AMBIEN) 5 MG tablet TAKE 1 TABLET BY MOUTH ONCE DAILY AT BEDTIME AS NEEDED FOR SLEEP. 30 tablet 0   No current facility-administered medications for this visit.    I,Alexis Herring,acting as a scribe for Derwood Kaplan, MD.,have documented all relevant documentation on the behalf of Derwood Kaplan, MD,as directed by  Derwood Kaplan, MD while in the presence of Derwood Kaplan, MD.

## 2021-11-28 ENCOUNTER — Encounter: Payer: Self-pay | Admitting: Oncology

## 2021-11-28 LAB — CEA: CEA: 5.9

## 2021-12-16 ENCOUNTER — Encounter: Payer: Self-pay | Admitting: Hematology and Oncology

## 2021-12-17 DIAGNOSIS — C3491 Malignant neoplasm of unspecified part of right bronchus or lung: Secondary | ICD-10-CM | POA: Diagnosis not present

## 2021-12-17 DIAGNOSIS — I509 Heart failure, unspecified: Secondary | ICD-10-CM | POA: Diagnosis not present

## 2021-12-17 DIAGNOSIS — Z139 Encounter for screening, unspecified: Secondary | ICD-10-CM | POA: Diagnosis not present

## 2021-12-17 DIAGNOSIS — R531 Weakness: Secondary | ICD-10-CM | POA: Diagnosis not present

## 2021-12-17 DIAGNOSIS — J90 Pleural effusion, not elsewhere classified: Secondary | ICD-10-CM | POA: Diagnosis not present

## 2021-12-19 DIAGNOSIS — G4700 Insomnia, unspecified: Secondary | ICD-10-CM | POA: Diagnosis not present

## 2021-12-19 DIAGNOSIS — M1990 Unspecified osteoarthritis, unspecified site: Secondary | ICD-10-CM | POA: Diagnosis not present

## 2021-12-19 DIAGNOSIS — I491 Atrial premature depolarization: Secondary | ICD-10-CM | POA: Diagnosis not present

## 2021-12-19 DIAGNOSIS — J701 Chronic and other pulmonary manifestations due to radiation: Secondary | ICD-10-CM | POA: Diagnosis not present

## 2021-12-19 DIAGNOSIS — Z743 Need for continuous supervision: Secondary | ICD-10-CM | POA: Diagnosis not present

## 2021-12-19 DIAGNOSIS — I69354 Hemiplegia and hemiparesis following cerebral infarction affecting left non-dominant side: Secondary | ICD-10-CM | POA: Diagnosis not present

## 2021-12-19 DIAGNOSIS — Z8719 Personal history of other diseases of the digestive system: Secondary | ICD-10-CM | POA: Diagnosis not present

## 2021-12-19 DIAGNOSIS — I5032 Chronic diastolic (congestive) heart failure: Secondary | ICD-10-CM | POA: Diagnosis not present

## 2021-12-19 DIAGNOSIS — Z85118 Personal history of other malignant neoplasm of bronchus and lung: Secondary | ICD-10-CM | POA: Diagnosis not present

## 2021-12-19 DIAGNOSIS — I110 Hypertensive heart disease with heart failure: Secondary | ICD-10-CM | POA: Diagnosis not present

## 2021-12-19 DIAGNOSIS — I2510 Atherosclerotic heart disease of native coronary artery without angina pectoris: Secondary | ICD-10-CM | POA: Diagnosis not present

## 2021-12-19 DIAGNOSIS — Z23 Encounter for immunization: Secondary | ICD-10-CM | POA: Diagnosis not present

## 2021-12-19 DIAGNOSIS — I4891 Unspecified atrial fibrillation: Secondary | ICD-10-CM | POA: Diagnosis not present

## 2021-12-19 DIAGNOSIS — E7800 Pure hypercholesterolemia, unspecified: Secondary | ICD-10-CM | POA: Diagnosis not present

## 2021-12-19 DIAGNOSIS — Z87440 Personal history of urinary (tract) infections: Secondary | ICD-10-CM | POA: Diagnosis not present

## 2021-12-19 DIAGNOSIS — Z9221 Personal history of antineoplastic chemotherapy: Secondary | ICD-10-CM | POA: Diagnosis not present

## 2021-12-19 DIAGNOSIS — I10 Essential (primary) hypertension: Secondary | ICD-10-CM | POA: Diagnosis not present

## 2021-12-19 DIAGNOSIS — I499 Cardiac arrhythmia, unspecified: Secondary | ICD-10-CM | POA: Diagnosis not present

## 2021-12-19 DIAGNOSIS — F32A Depression, unspecified: Secondary | ICD-10-CM | POA: Diagnosis not present

## 2021-12-19 DIAGNOSIS — J90 Pleural effusion, not elsewhere classified: Secondary | ICD-10-CM | POA: Diagnosis not present

## 2021-12-19 DIAGNOSIS — Z87891 Personal history of nicotine dependence: Secondary | ICD-10-CM | POA: Diagnosis not present

## 2021-12-19 DIAGNOSIS — R069 Unspecified abnormalities of breathing: Secondary | ICD-10-CM | POA: Diagnosis not present

## 2021-12-19 DIAGNOSIS — J440 Chronic obstructive pulmonary disease with acute lower respiratory infection: Secondary | ICD-10-CM | POA: Diagnosis not present

## 2021-12-19 DIAGNOSIS — Z923 Personal history of irradiation: Secondary | ICD-10-CM | POA: Diagnosis not present

## 2021-12-19 DIAGNOSIS — A419 Sepsis, unspecified organism: Secondary | ICD-10-CM | POA: Diagnosis not present

## 2021-12-19 DIAGNOSIS — K219 Gastro-esophageal reflux disease without esophagitis: Secondary | ICD-10-CM | POA: Diagnosis not present

## 2021-12-19 DIAGNOSIS — J441 Chronic obstructive pulmonary disease with (acute) exacerbation: Secondary | ICD-10-CM | POA: Diagnosis not present

## 2021-12-19 DIAGNOSIS — Z9981 Dependence on supplemental oxygen: Secondary | ICD-10-CM | POA: Diagnosis not present

## 2021-12-19 DIAGNOSIS — J9621 Acute and chronic respiratory failure with hypoxia: Secondary | ICD-10-CM | POA: Diagnosis not present

## 2021-12-19 DIAGNOSIS — R000 Tachycardia, unspecified: Secondary | ICD-10-CM | POA: Diagnosis not present

## 2021-12-19 DIAGNOSIS — I252 Old myocardial infarction: Secondary | ICD-10-CM | POA: Diagnosis not present

## 2021-12-19 DIAGNOSIS — R6889 Other general symptoms and signs: Secondary | ICD-10-CM | POA: Diagnosis not present

## 2021-12-19 DIAGNOSIS — R0902 Hypoxemia: Secondary | ICD-10-CM | POA: Diagnosis not present

## 2021-12-21 DIAGNOSIS — R000 Tachycardia, unspecified: Secondary | ICD-10-CM | POA: Diagnosis not present

## 2021-12-21 DIAGNOSIS — I491 Atrial premature depolarization: Secondary | ICD-10-CM | POA: Diagnosis not present

## 2021-12-25 DIAGNOSIS — J9611 Chronic respiratory failure with hypoxia: Secondary | ICD-10-CM | POA: Diagnosis not present

## 2021-12-25 DIAGNOSIS — J449 Chronic obstructive pulmonary disease, unspecified: Secondary | ICD-10-CM | POA: Diagnosis not present

## 2021-12-31 DIAGNOSIS — J9621 Acute and chronic respiratory failure with hypoxia: Secondary | ICD-10-CM | POA: Diagnosis not present

## 2021-12-31 DIAGNOSIS — R739 Hyperglycemia, unspecified: Secondary | ICD-10-CM | POA: Diagnosis not present

## 2021-12-31 DIAGNOSIS — I10 Essential (primary) hypertension: Secondary | ICD-10-CM | POA: Diagnosis not present

## 2021-12-31 DIAGNOSIS — Z79899 Other long term (current) drug therapy: Secondary | ICD-10-CM | POA: Diagnosis not present

## 2022-01-14 DIAGNOSIS — I480 Paroxysmal atrial fibrillation: Secondary | ICD-10-CM | POA: Diagnosis not present

## 2022-01-14 DIAGNOSIS — J449 Chronic obstructive pulmonary disease, unspecified: Secondary | ICD-10-CM | POA: Diagnosis not present

## 2022-01-14 DIAGNOSIS — R531 Weakness: Secondary | ICD-10-CM | POA: Diagnosis not present

## 2022-01-14 DIAGNOSIS — R739 Hyperglycemia, unspecified: Secondary | ICD-10-CM | POA: Diagnosis not present

## 2022-01-16 ENCOUNTER — Other Ambulatory Visit: Payer: Self-pay | Admitting: Oncology

## 2022-01-16 DIAGNOSIS — G4700 Insomnia, unspecified: Secondary | ICD-10-CM

## 2022-01-16 MED ORDER — ZOLPIDEM TARTRATE 5 MG PO TABS: ORAL_TABLET | ORAL | 0 refills | Status: DC

## 2022-01-21 ENCOUNTER — Ambulatory Visit (HOSPITAL_BASED_OUTPATIENT_CLINIC_OR_DEPARTMENT_OTHER): Payer: Medicare Other | Admitting: Family

## 2022-01-25 DIAGNOSIS — J449 Chronic obstructive pulmonary disease, unspecified: Secondary | ICD-10-CM | POA: Diagnosis not present

## 2022-01-25 DIAGNOSIS — J9611 Chronic respiratory failure with hypoxia: Secondary | ICD-10-CM | POA: Diagnosis not present

## 2022-02-04 ENCOUNTER — Encounter: Payer: Self-pay | Admitting: Hematology and Oncology

## 2022-02-10 DIAGNOSIS — I480 Paroxysmal atrial fibrillation: Secondary | ICD-10-CM | POA: Diagnosis not present

## 2022-02-10 DIAGNOSIS — R531 Weakness: Secondary | ICD-10-CM | POA: Diagnosis not present

## 2022-02-10 DIAGNOSIS — J449 Chronic obstructive pulmonary disease, unspecified: Secondary | ICD-10-CM | POA: Diagnosis not present

## 2022-02-10 DIAGNOSIS — I509 Heart failure, unspecified: Secondary | ICD-10-CM | POA: Diagnosis not present

## 2022-02-10 DIAGNOSIS — I700 Atherosclerosis of aorta: Secondary | ICD-10-CM | POA: Diagnosis not present

## 2022-02-11 ENCOUNTER — Encounter: Payer: Self-pay | Admitting: Physician Assistant

## 2022-02-11 ENCOUNTER — Ambulatory Visit: Payer: 59 | Attending: Family | Admitting: Physician Assistant

## 2022-02-11 VITALS — BP 104/48 | HR 67 | Ht 59.0 in | Wt 139.0 lb

## 2022-02-11 DIAGNOSIS — I739 Peripheral vascular disease, unspecified: Secondary | ICD-10-CM | POA: Diagnosis not present

## 2022-02-11 DIAGNOSIS — C3490 Malignant neoplasm of unspecified part of unspecified bronchus or lung: Secondary | ICD-10-CM

## 2022-02-11 DIAGNOSIS — J449 Chronic obstructive pulmonary disease, unspecified: Secondary | ICD-10-CM | POA: Diagnosis not present

## 2022-02-11 DIAGNOSIS — I5033 Acute on chronic diastolic (congestive) heart failure: Secondary | ICD-10-CM | POA: Diagnosis not present

## 2022-02-11 DIAGNOSIS — I4891 Unspecified atrial fibrillation: Secondary | ICD-10-CM

## 2022-02-11 DIAGNOSIS — Z8673 Personal history of transient ischemic attack (TIA), and cerebral infarction without residual deficits: Secondary | ICD-10-CM

## 2022-02-11 DIAGNOSIS — I6523 Occlusion and stenosis of bilateral carotid arteries: Secondary | ICD-10-CM | POA: Diagnosis not present

## 2022-02-11 NOTE — Progress Notes (Signed)
Office Visit    Patient Name: [name] Date of Encounter: 02/11/2022  PCP:  Hurshel Party, NP   [facility]  Cardiologist:  Meriam Sprague, MD  Advanced Practice Provider:  No care team member to display Electrophysiologist:  Will Jorja Loa, MD    HPI    [name] is a 82 y.o. female with past medical history of CAD status post PCI to LCx in 2014 and NSTEMI 12/2018, hypertension, hyperlipidemia, chronic diastolic heart failure, tobacco use, PAD, CVA, carotid artery disease status post right carotid endarterectomy 07/2014 presents today for hospital follow-up.  She underwent cardiac catheterization December 2014 with findings of severe stenosis of OM1 treated with DES x 2.  May 2015 had SBO and required partial colon resection.  Underwent right carotid endarterectomy July 2016 complicated by postoperative stroke in the recovery room.  This resulted in left hemiaplasia.  Hospitalized December 2022 with retrosternal chest pressure diagnosed with NSTEMI and has elevated troponin at 03/11/1939 severe.  Underwent cardiac catheterization showing distal total occlusion of the obtuse marginal branch of the left circumflex coronary artery.  Faint collateralization from LAD.  LVEF 55 to 60%.  No PCI performed and medical management recommended.  Last seen 10/29/2020 and was doing well overall from a cardiac standpoint.  Prior to that appointment she was seen April 2022 and they recommended CT of the chest due to longstanding history of tobacco abuse.  Chest CT 05/2020 with new highly suspicious spiculated pulmonary nodule within the superior lateral aspect of the right lower lobe measuring 1.5 x 1 x 1.4 cm with associated cavitation, most negative neoplastic established with Dr. Melida Quitter of oncology for stage IIIb squamous cell cancer-received concurrent chemoradiation completed 6 cycles of carboplatin/Abraxane.  Plan for durvalumab x 1 year.   At her  last appointment and she endorsed feeling well overall.  Plan to be done with chemotherapy and radiation.  Grateful for her oncology team.  No shortness of breath or chest pain/pressure.  No edema.  In the ED 10/23 for dehydration. Her heart rate and blood sugar keeps going up,  Today, she tells me that she has been to the ED twice over the past month or so for her rhythm. She states that her heart rate has been as high as 180 but it has not been that high recently.  She was started on metoprolol succinate 75 mg daily as well as diltiazem 180 mg daily within the last few weeks.  She feels like these medications have helped control her heart rate.  She thinks she may have had her heart shocked back in 2022 but she does not remember all the details.  She also states that when she goes out in cold weather she has sudden shortness of breath and her chest hurts so severely that she cries.  This sounds to me like it is potentially bronchospasm related since it subsides when she goes back inside.  She tries not to stay outside for too long but she has to open the door to let her dogs out.  Suggested that she uses her bronchodilator medication before opening the door.  She is on 4 L nasal cannula and rarely has to increase it and less she has an episode of atrial fibrillation.  Blood pressure is low normal today and heart rate is in the 60s.  Unfortunately we do not have room to titrate any medications today.  No edema, orthopnea, PND.   Past Medical  History    Past Medical History:  Diagnosis Date   Acute respiratory failure with hypoxia s/p tracheostomy 07/20/2013   Anemia    a. 7-08/2013 felt due to recent critical illness/chronic disease.   Arthritis    handds, knees & back    B12 deficiency 11/14/2020   Barrett's esophagus    CAD (coronary artery disease)    a. Mod dz 2010 initially mgd medically. b. 12/2012 - angina s/p PTCA/DES to mid-circumflex, PTCA/DES to first OM.    Cancer [facility])    Carotid  artery disease (HCC)    a. 60-70% bilat ICA stenosis by dopplers 05/2012.   Chronic back pain    deteriorating;DDD   Colonic obstruction due to suspected colitis; s/p colectomy/colostomy    a. See SBO.   Complication of anesthesia    hard to wake up from anesthesia    COPD (chronic obstructive pulmonary disease) (HCC)    Depression    takes Remeron and Zoloft daily   Dizziness    was on Bentyl which caused this-took off of it and no problems since   DVT of axillary vein, acute right (HCC)    a. Dx 07/2013 felt due to R IJ central line that had been inserted 7/14. Not on anticoag due to GIB issues and small cerebral bleed.   GERD (gastroesophageal reflux disease)    Heart murmur    History of bronchitis    > 39yrs ago    History of colon polyps    History of hiatal hernia    History of kidney stones    was told it was stable but doesn't know for sure that she ever passed it   HTN (hypertension)    Hypertension    takes Metoprolol daily   Intracranial hemorrhage (HCC)    a. 08/11/13 - per DC summary, tiny SAH vs SDH done for altered mentation, anticoag stopped including aspirin.   Joint pain    Neck pain    DDD   Other and unspecified hyperlipidemia    takes Lipitor daily   Pneumonia    hx of->15 yrs ago   Protein-calorie malnutrition, severe w/ electrolyte imbalance    a. Severe hypoalbuminemia leading to 3rd spacing including anasarca and pulm edema 07/2013.   SBO (small bowel obstruction) (HCC)    a. Lysis of adhesions and ovarian cystectomy 04/2013 for sBO. b. Admitted 07/2013 for colonic obstruction due to suspected colitis, s/p partial colectomy/colostomy 08/01/13. Admission complicated by anasarca, acute resp failure requiring tracheostomy, decannulated 08/25/13. c. Recurrent SBO8/2015, NGT placed for decompression.    Stroke [facility]) early 80's   right sided weakness--TIA   Past Surgical History:  Procedure Laterality Date   ABDOMINAL HYSTERECTOMY     APPENDECTOMY     BOWEL  RESECTION N/A 08/01/2013   Procedure: SMALL BOWEL RESECTION;  Surgeon: Shelly Rubenstein, MD;  Location: [facility] OR;  Service: General;  Laterality: N/A;   BRONCHIAL BIOPSY  07/02/2020   Procedure: BRONCHIAL BIOPSIES;  Surgeon: Leslye Peer, MD;  Location: [facility] ENDOSCOPY;  Service: Pulmonary;;   BRONCHIAL BRUSHINGS  07/02/2020   Procedure: BRONCHIAL BRUSHINGS;  Surgeon: Leslye Peer, MD;  Location: [facility] ENDOSCOPY;  Service: Pulmonary;;   BRONCHIAL NEEDLE ASPIRATION BIOPSY  07/02/2020   Procedure: BRONCHIAL NEEDLE ASPIRATION BIOPSIES;  Surgeon: Leslye Peer, MD;  Location: [facility] ENDOSCOPY;  Service: Pulmonary;;   BRONCHIAL WASHINGS  07/02/2020   Procedure: BRONCHIAL WASHINGS;  Surgeon: Leslye Peer, MD;  Location: [facility] ENDOSCOPY;  Service: Pulmonary;;  cataract surgery Bilateral    CHOLECYSTECTOMY     COLON RESECTION     COLON SURGERY     COLOSTOMY N/A 08/01/2013   Procedure: COLOSTOMY;  Surgeon: Shelly Rubenstein, MD;  Location: [facility] OR;  Service: General;  Laterality: N/A;   COLOSTOMY TAKEDOWN  01/24/2014   dr blackman   COLOSTOMY TAKEDOWN N/A 01/24/2014   Procedure: COLOSTOMY TAKEDOWN;  Surgeon: Abigail Miyamoto, MD;  Location: [facility] OR;  Service: General;  Laterality: N/A;   CORONARY ANGIOPLASTY WITH STENT PLACEMENT  12/20/2012   STENT TO OM         DR COOPER   ENDARTERECTOMY Right 08/08/2014   Procedure: RIGHT CAROTID ENDARTERECTOMY WITH HEMASHIELD PATCH ANGIOPLASTY;  Surgeon: Sherren Kerns, MD;  Location: [facility] OR;  Service: Vascular;  Laterality: Right;   FIDUCIAL MARKER PLACEMENT  07/02/2020   Procedure: FIDUCIAL MARKER PLACEMENT;  Surgeon: Leslye Peer, MD;  Location: [facility] ENDOSCOPY;  Service: Pulmonary;;   FOOT SURGERY Left    HAND SURGERY Bilateral    for ganglion cysts   HEMOSTASIS CONTROL  07/02/2020   Procedure: HEMOSTASIS CONTROL;  Surgeon: Leslye Peer, MD;  Location: [facility] ENDOSCOPY;  Service: Pulmonary;;  ice saline and epi   IR IMAGING GUIDED PORT INSERTION  08/23/2020   LAPAROTOMY  N/A 08/01/2013   Procedure: EXPLORATORY LAPAROTOMY;  Surgeon: Shelly Rubenstein, MD;  Location: [facility] OR;  Service: General;  Laterality: N/A;   LEFT HEART CATH AND CORONARY ANGIOGRAPHY N/A 01/10/2019   Procedure: LEFT HEART CATH AND CORONARY ANGIOGRAPHY;  Surgeon: Lennette Bihari, MD;  Location: [facility] INVASIVE CV LAB;  Service: Cardiovascular;  Laterality: N/A;   LEFT HEART CATHETERIZATION WITH CORONARY ANGIOGRAM N/A 12/20/2012   Procedure: LEFT HEART CATHETERIZATION WITH CORONARY ANGIOGRAM;  Surgeon: Micheline Chapman, MD;  Location: [facility] CATH LAB;  Service: Cardiovascular;  Laterality: N/A;   PARTIAL COLECTOMY N/A 08/01/2013   Procedure: PARTIAL COLECTOMY;  Surgeon: Shelly Rubenstein, MD;  Location: [facility] OR;  Service: General;  Laterality: N/A;   ROTATOR CUFF REPAIR Left    TONSILLECTOMY     TRACHEOSTOMY     feinstein   VIDEO BRONCHOSCOPY WITH ENDOBRONCHIAL NAVIGATION N/A 07/02/2020   Procedure: VIDEO BRONCHOSCOPY WITH ENDOBRONCHIAL NAVIGATION;  Surgeon: Leslye Peer, MD;  Location: [facility] ENDOSCOPY;  Service: Pulmonary;  Laterality: N/A;    Allergies  Allergies  Allergen Reactions   Pregabalin Swelling    Tongue swelling   Paclitaxel    Sulfonamide Derivatives Swelling    Childhood reaction     EKGs/Labs/Other Studies Reviewed:   The following studies were reviewed today:  Echocardiogram 11/06/2021 External report echocardiogram reviewed with LVEF 60 to 65%.  Mild mitral sclerosis with assist.  Mild tricuspid vegetations.  EKG:  EKG is  ordered today.  The ekg ordered today demonstrates NSR with PVCs.  Recent Labs: 04/25/2021: TSH 1.435 11/25/2021: ALT 19; BUN 22; Creatinine 1.3; Hemoglobin 11.2; Platelets 183; Potassium 3.8; Sodium 135  Recent Lipid Panel    Component Value Date/Time   CHOL 117 01/09/2019 0053   CHOL 150 01/30/2016 1052   TRIG 86 01/09/2019 0053   HDL 42 01/09/2019 0053   HDL 38 (L) 01/30/2016 1052   CHOLHDL 2.8 01/09/2019 0053   VLDL 17 01/09/2019 0053    LDLCALC 58 01/09/2019 0053   LDLCALC 84 01/30/2016 1052    Risk Assessment/Calculations:   CHA2DS2-VASc Score = 8   This indicates a 10.8% annual risk of stroke. The patient's score is based upon: CHF History:  1 HTN History: 1 Diabetes History: 0 Stroke History: 2 Vascular Disease History: 1 Age Score: 2 Gender Score: 1     Home Medications   Current Meds  Medication Sig   apixaban (ELIQUIS) 5 MG TABS tablet Take 5 mg by mouth 2 (two) times daily.   aspirin EC 81 MG tablet Take 2 tablets (162 mg total) by mouth in the morning. Okay to restart this medication on 07/04/2020   atorvastatin (LIPITOR) 80 MG tablet Take 80 mg by mouth every evening.    budesonide (PULMICORT) 0.5 MG/2ML nebulizer solution Take by nebulization.   buPROPion (WELLBUTRIN XL) 150 MG 24 hr tablet Take 1 tablet (150 mg total) by mouth daily.   diltiazem (CARDIZEM CD) 180 MG 24 hr capsule Take 180 mg by mouth daily.   DULoxetine (CYMBALTA) 60 MG capsule Take 60 mg by mouth daily.   ferrous sulfate 324 MG TBEC Take 324 mg by mouth daily with breakfast.   fish oil-omega-3 fatty acids 1000 MG capsule Take 1,000 mg by mouth in the morning.   folic acid (FOLVITE) 800 MCG tablet Take 800 mcg by mouth in the morning.   furosemide (LASIX) 40 MG tablet Take 40 mg by mouth every morning.   ipratropium-albuterol (DUONEB) 0.5-2.5 (3) MG/3ML SOLN USE 1 VIAL VIA NEBULIZER EVERY 6 HOURS AS NEEDED   metoprolol succinate (TOPROL-XL) 50 MG 24 hr tablet Take 75 mg by mouth daily. Take with or immediately following a meal.   mirtazapine (REMERON) 45 MG tablet Take 45 mg by mouth at bedtime.    Multiple Vitamins-Minerals (CENTRUM ADULTS PO) Take by mouth. Centrum silver   nitroGLYCERIN (NITROSTAT) 0.4 MG SL tablet DISSOLVE 1 TABLET UNDER THE TONGUE EVERY 5 MINUTES AS  NEEDED FOR CHEST PAIN. MAX  OF 3 TABLETS IN 15 MINUTES. CALL 911 IF PAIN PERSISTS.   ondansetron (ZOFRAN) 4 MG tablet Take 4 mg by mouth every 8 (eight) hours as  needed for nausea or vomiting.   pantoprazole (PROTONIX) 40 MG tablet Take 40 mg by mouth in the morning.   potassium chloride SA (KLOR-CON M) 20 MEQ tablet Take 1 tablet (20 mEq total) by mouth 2 (two) times daily.   zolpidem (AMBIEN) 5 MG tablet TAKE 1 TABLET BY MOUTH ONCE DAILY AT BEDTIME AS NEEDED FOR SLEEP.     Review of Systems      All other systems reviewed and are otherwise negative except as noted above.  Physical Exam    VS:  BP (!) 104/48   Pulse 67   Ht 4\' 11"  (1.499 m)   Wt 139 lb (63 kg)   SpO2 97%   BMI 28.07 kg/m  , BMI Body mass index is 28.07 kg/m.  Wt Readings from Last 3 Encounters:  02/11/22 139 lb (63 kg)  11/27/21 135 lb 8 oz (61.5 kg)  08/23/21 131 lb 3.2 oz (59.5 kg)     GEN: Well nourished, well developed, in no acute distress. HEENT: normal. Neck: Supple, no JVD, carotid bruits, or masses. Cardiac: RRR, no murmurs, rubs, or gallops. No clubbing, cyanosis, edema.  Radials/PT 2+ and equal bilaterally.  Respiratory:  Respirations regular and unlabored, clear to auscultation bilaterally. GI: Soft, nontender, nondistended. MS: No deformity or atrophy. Skin: Warm and dry, no rash. Neuro:  Strength and sensation are intact. Psych: Normal affect.  Assessment & Plan    New onset Afib? -patient endorses what sounds like DCCV back in 2022 however recently started on metoprolol succinate and diltiazem  -BP  low normal today so not much room for titration -On Eliquis (she states for leg pain), encouraged her not to miss a dose -referral to EP to review other options -reviewed labs last month, TSH normal, potassium 3.8 -CHA2DS2-VASc score is 8  CVA after enterectomy 2014 -no current symptoms  PAD -pain in her legs has improved since starting Eliquis  COPD -on oxygen at home 4L -continue current regimen with inhaled medications -recently changed from albuterol to xopenex  5. Chronic diastolic CHF -euvolemic on exam today -continue lasix 40mg   daily -echo from 10/18/21 reviewed       Disposition: Follow up 3 months with 10/20/21, MD or APP. Follow-up arranged with EP for 1 month   Signed, Meriam Sprague, PA-C 02/11/2022, 2:24 PM [facility]

## 2022-02-11 NOTE — Patient Instructions (Signed)
Medication Instructions:  Be sure not to miss any doses of Eliquis *If you need a refill on your cardiac medications before your next appointment, please call your pharmacy*   Lab Work: None If you have labs (blood work) drawn today and your tests are completely normal, you will receive your results only by: MyChart Message (if you have MyChart) OR A paper copy in the mail If you have any lab test that is abnormal or we need to change your treatment, we will call you to review the results.   Follow-Up: At [facility], you and your health needs are our priority.  As part of our continuing mission to provide you with exceptional heart care, we have created designated Provider Care Teams.  These Care Teams include your primary Cardiologist (physician) and Advanced Practice Providers (APPs -  Physician Assistants and Nurse Practitioners) who all work together to provide you with the care you need, when you need it.   Your next appointment:   3 month(s)  Provider:   Meriam Sprague, MD  or APP  Other Instructions You have been referred to see an electrophysiologist here in our office.

## 2022-02-13 ENCOUNTER — Other Ambulatory Visit: Payer: Self-pay

## 2022-02-13 DIAGNOSIS — G4700 Insomnia, unspecified: Secondary | ICD-10-CM

## 2022-02-13 MED ORDER — ZOLPIDEM TARTRATE 5 MG PO TABS: ORAL_TABLET | ORAL | 0 refills | Status: DC

## 2022-02-18 ENCOUNTER — Ambulatory Visit (HOSPITAL_BASED_OUTPATIENT_CLINIC_OR_DEPARTMENT_OTHER): Payer: Medicare Other | Admitting: Family

## 2022-02-21 NOTE — Progress Notes (Signed)
[facility]  [address] [place],  [place]  [address] [phone]  Clinic Day: 02/24/22   Referring physician: Lowella Dandy, NP  ASSESSMENT & PLAN:   Assessment & Plan:  Stage IIIB (T1b N3 M0) squamous cell lung cancer, diagnosed in June 2022.  She received concurrent chemoradiation and completed 6 cycles of carboplatin/Abraxane.  I do not feel she needs further chemotherapy at this time.  She completed radiation therapy on September 6th, and CT imaging has shown a good response. She started maintenance immunotherapy with durvalumab for 1 year on October 11th and only received 2 cycles. Her last dose of immunotherapy was on October 27th 2022. She had a prolonged hospitalization after COVID pneumonia and was never able to resume therapy. CT imaging from February 2023 revealed no evidence of progressive disease. Her latest scan from 11/25/2021 remains stable with findings of emphysema. She has post treatment changes in the lungs but no evidence of recurrent or metastatic disease. She does have a small loculated pleural effusion. Her CEA decreased from 7.1 to 5.9 at her last visit in November.    2.  COVID infection, August 2022, requiring hospitalization for pneumonia from November 8th to December 1st.   3.  Osteopenia.  She is up to date on bone density scans as of April 2022, which are scheduled through Dr. Delena Bali office.  4.  Anemia, consistent with iron deficiency. She is taking PO iron once weekly. HGB of 11.2  in November 2023.  5.  Hypokalemia.  Taking Potassium supplementation x2 BID. Last potassium was 3.8 in November 2023.  6. Kidney failure. Creatinine of 1.30 in November 2023.   Plan She has acute bronchitis with a cough production of yellow sputum for the past 2 weeks. I prescribed Augmentin 875mg  BID for 10 days. Her last CT chest scan with contrast scan was about 3 months ago so we will repeat it next time. Her labs are pending. We will  send copies of her labs to Amy. I will see her back in 3 months with CBC, CMP, CEA, and CT of the chest. She understands and agrees with this plan of care. I have answered her questions and she knows to call with any concerns.   I provided 17 minutes of face-to-face time during this this encounter and > 50% was spent counseling as documented under my assessment and plan.   Derwood Kaplan, MD  [facility] [address] [place] [address] Dept: [phone] Dept Fax: [phone]    CHIEF COMPLAINT:  CC: Stage IIIB squamous cell lung cancer  Current Treatment: Surveillance   HISTORY OF PRESENT ILLNESS:  Stage IIIB (T1b, N3, M0) squamous cell lung cancer diagnosed in June 2022.  This began when the patient started to experience intermittent chest pain and shortness of breath, and followed up with cardiology for further evaluation.  Chest x-ray was abnormal.  Due to her history of COPD and long history of smoking she was referred to pulmonology.  CT chest from May revealed a new highly suspicious spiculated pulmonary nodule within the superior lateral aspect of the right lower lobe measuring 1.5 x 1.0 x 1.4 cm with central small cavitation highly suspicious to be a neoplastic nodule.  There was also nodularity along the upper wall of the left mainstem bronchus of uncertain etiology but also suspicious for additional neoplastic process.  CT super D of the chest from June revealed  enlargement of the thick-walled spiculated cavitary nodule in the superior segment of the right lower lobe measuring 1.6 x 1.6.  There was also numerous mediastinal lymph nodes some of which are enlarged including high AP window lymph node measuring 1.2 cm, lower right paratracheal lymph node measuring 1.4 cm. Final pathology confirmed malignant cells consistent with non-small cell carcinoma. Overall, the morphology and immunophenotype favor a squamous  cell carcinoma.  Staging PET scan showed the hypermetabolic right lower lobe lung lesion consistent with primary lung neoplasm.  There was also mediastinal and hilar lymphadenopathy but no findings to suggest abdominal/pelvic metastatic disease or osseous metastatic disease for a T1b N3 M0, stage IIIB.  MRI brain was negative for metastasis.  She was referred to Dr. Julien Nordmann, oncology, and was started concurrent chemoradiation with weekly carboplatin/paclitaxel.  Her 1st cycle was July 11th.  With the 3rd cycle, she developed a reaction to paclitaxel and so was switched to Abraxane.  She has received 6 cycles of therapy.    She has a medical history significant for coronary artery disease status post stent placement, carotid artery disease, colon obstruction status post surgical intervention, depression, COPD, DVT, GERD, intracranial hemorrhage, hypertension, strokes, anemia and Barrett's esophagus. [name] states that she has quit smoking since her diagnosis in 07/07/22 after 60 pack years.  She contracted COVID back in August of 2022 and was quite ill and required hospitalization for 5 days.  She was readmitted in November and spent months in the hospital and rehab before returning home, so we never resumed immunotherapy. She just had the 2 doses in October of 2022.  Her husband expired in 04/05/2021 and then her daughter died of a MVA in July 07, 2022. She is understandably depressed and feels that she is going through this alone now.  She has had insomnia, which has improved with Ambien.  She does admit to depression as she is still grieving, and has not been able to continue her normal activities.  Oncology History  Malignant neoplasm of bronchus of right lower lobe ([facility])  07/10/2020 Initial Diagnosis   Stage III squamous cell carcinoma of right lung ([place])   07/10/2020 Cancer Staging   Staging form: Lung, AJCC 8th Edition - Clinical: Stage IIIB (cT1b, cN3, cM0) - Signed by Curt Bears, MD on 07/10/2020    07/30/2020 - 09/17/2020 Chemotherapy         10/25/2020 Imaging   CT chest: 1. Interval decrease in size of the right lower lobe lung lesion.  2. Progressive airspace opacity and atelectasis in the left upper  lobe.  3. Persistent small left effusion and left lower lobe atelectasis or  infiltrate.  4. New 15.5 mm sub solid nodular lesion in the right upper lobe just above the minor fissure. It is possible this is an area of  inflammation but attention on future studies is suggested. Recommend follow-up noncontrast chest CT in 3-4 months.  5. Stable emphysematous changes and pulmonary scarring.  6. Slightly smaller borderline enlarged mediastinal and hilar lymph  nodes.  7. Stable atherosclerotic calcifications involving the aorta and  branch vessels including three-vessel coronary artery calcifications.    10/30/2020 - 11/15/2020 Chemotherapy   Patient is on Treatment Plan : LUNG Durvalumab q14d        INTERVAL HISTORY:  Clovia is here for follow up for her stage III non-small cell lung cancer. She was hospitalized in September, 2023 for treatment of pneumonia and respiratory failure. She was again hospitalized in October for kidney failure secondary to  severe projectile emesis. Patient states that she feels fair and still uses oxygen the majority of the time. The patients daughter informed me that her mom coughs occasionally and it has been going on for several weeks. The patient is coughing up yellow phlegm. I will prescribe Augmentin 875mg  BID for 10 days. She continues to take Ambien before bed. Her daughter informed me that her mother has occasional times of confusion and being unable to focus. She's worried it can be Alzheimers or "chemo fog" I advised her that I believe it could be from hardening of the arteries. Chemo brain usually occurs during or soon after chemotherapy and it has been years since she was last treated. Her CEA had decreased from 7.1 to 5.9 in November of 2023. We  will send copies of her labs to Amy. Her last CT chest scan with contrast was about 3 months ago so we will repeat it next time. Her labs are pending. I will see her back in 3 months with CBC, CMP, CEA, and CT of the chest. She denies signs of infection such as sore throat, sinus drainage, cough, or urinary symptoms.  She denies fevers or recurrent chills. She denies pain. She denies nausea, vomiting, chest pain, dyspnea or cough. Her weight has increased 4 pounds over last month . She is accompanied today with her youngest daughter.  REVIEW OF SYSTEMS:  Review of Systems  Constitutional: Negative.  Negative for appetite change, chills, diaphoresis, fatigue, fever and unexpected weight change.  HENT:  Negative.  Negative for hearing loss, lump/mass, mouth sores, nosebleeds, sore throat, tinnitus, trouble swallowing and voice change.   Eyes: Negative.  Negative for eye problems and icterus.  Respiratory:  Positive for cough (occasionally). Negative for chest tightness, hemoptysis, shortness of breath and wheezing.   Cardiovascular: Negative.  Negative for chest pain, leg swelling and palpitations.  Gastrointestinal: Negative.  Negative for abdominal distention, abdominal pain, blood in stool, constipation, diarrhea, nausea, rectal pain and vomiting.  Endocrine: Negative.   Genitourinary: Negative.  Negative for bladder incontinence, difficulty urinating, dyspareunia, dysuria, frequency, hematuria, menstrual problem, nocturia, pelvic pain, vaginal bleeding and vaginal discharge.   Musculoskeletal:  Positive for back pain (chronic spasms). Negative for arthralgias, flank pain, gait problem, myalgias, neck pain and neck stiffness.  Skin: Negative.  Negative for itching, rash and wound.  Neurological:  Negative for dizziness, extremity weakness, gait problem, headaches, light-headedness, numbness, seizures and speech difficulty.  Hematological:  Negative for adenopathy. Bruises/bleeds easily.   Psychiatric/Behavioral:  Positive for decreased concentration (occasionally). Negative for confusion, depression, sleep disturbance and suicidal ideas. The patient is not nervous/anxious.      VITALS:  Blood pressure 129/66, pulse 95, temperature 98.6 F (37 C), temperature source Oral, resp. rate 20, height 4\' 11"  (1.499 m), weight 143 lb 1.6 oz (64.9 kg), SpO2 96 %.  Wt Readings from Last 3 Encounters:  02/24/22 143 lb 1.6 oz (64.9 kg)  02/11/22 139 lb (63 kg)  11/27/21 135 lb 8 oz (61.5 kg)    Body mass index is 28.9 kg/m.  Performance status (ECOG): 1 - Symptomatic but completely ambulatory  PHYSICAL EXAM:  Physical Exam Vitals and nursing note reviewed. Exam conducted with a chaperone present.  Constitutional:      General: She is not in acute distress.    Appearance: Normal appearance. She is normal weight. She is not ill-appearing, toxic-appearing or diaphoretic.  HENT:     Head: Normocephalic and atraumatic.     Right Ear: Tympanic membrane,  ear canal and external ear normal. There is no impacted cerumen.     Left Ear: Tympanic membrane, ear canal and external ear normal. There is no impacted cerumen.     Nose: Nose normal. No congestion or rhinorrhea.     Mouth/Throat:     Mouth: Mucous membranes are moist.     Pharynx: Oropharynx is clear. No oropharyngeal exudate or posterior oropharyngeal erythema.  Eyes:     General: No scleral icterus.       Right eye: No discharge.        Left eye: No discharge.     Extraocular Movements: Extraocular movements intact.     Conjunctiva/sclera: Conjunctivae normal.     Pupils: Pupils are equal, round, and reactive to light.  Neck:     Vascular: No carotid bruit.  Cardiovascular:     Rate and Rhythm: Normal rate and regular rhythm.     Pulses: Normal pulses.     Heart sounds: Normal heart sounds. No murmur heard.    No friction rub. No gallop.  Pulmonary:     Effort: Pulmonary effort is normal. No respiratory distress.      Breath sounds: No stridor. Examination of the right-upper field reveals wheezing. Wheezing (tiny wheeze.) present. No rhonchi or rales.  Chest:     Chest wall: No tenderness.  Abdominal:     General: Bowel sounds are normal. There is no distension.     Palpations: Abdomen is soft. There is no hepatomegaly, splenomegaly or mass.     Tenderness: There is no abdominal tenderness. There is no right CVA tenderness, left CVA tenderness, guarding or rebound.     Hernia: No hernia is present.  Musculoskeletal:        General: No swelling, tenderness, deformity or signs of injury. Normal range of motion.     Cervical back: Normal range of motion and neck supple. No rigidity or tenderness.     Right lower leg: No edema.     Left lower leg: No edema.  Lymphadenopathy:     Cervical: No cervical adenopathy.  Skin:    General: Skin is warm and dry.     Coloration: Skin is not jaundiced or pale.     Findings: No bruising, erythema, lesion or rash.  Neurological:     General: No focal deficit present.     Mental Status: She is alert and oriented to person, place, and time. Mental status is at baseline.     Cranial Nerves: No cranial nerve deficit.     Sensory: No sensory deficit.     Motor: No weakness.     Coordination: Coordination normal.     Gait: Gait normal.     Deep Tendon Reflexes: Reflexes normal.  Psychiatric:        Mood and Affect: Mood normal.        Behavior: Behavior normal.        Thought Content: Thought content normal.        Judgment: Judgment normal.     LABS:      Latest Ref Rng & Units 02/24/2022    2:02 PM 11/25/2021   12:00 AM 08/23/2021   12:00 AM  CBC  WBC 4.0 - 10.5 K/uL 7.2  6.4     6.0      Hemoglobin 12.0 - 15.0 g/dL 10.6  11.2     10.8      Hematocrit 36.0 - 46.0 % 35.1  34     33  Platelets 150 - 400 K/uL 173  183     171         This result is from an external source.      Latest Ref Rng & Units 02/24/2022    2:02 PM 11/25/2021   12:00 AM 08/23/2021    12:00 AM  CMP  Glucose 70 - 99 mg/dL 141     BUN 8 - 23 mg/dL 23  22     18       Creatinine 0.44 - 1.00 mg/dL 0.96  1.3     0.8      Sodium 135 - 145 mmol/L 141  135     141      Potassium 3.5 - 5.1 mmol/L 4.0  3.8     3.2      Chloride 98 - 111 mmol/L 109  105     109      CO2 22 - 32 mmol/L 25  21     22       Calcium 8.9 - 10.3 mg/dL 9.2  9.1     9.4      Total Protein 6.5 - 8.1 g/dL 6.5     Total Bilirubin 0.3 - 1.2 mg/dL 0.7     Alkaline Phos 38 - 126 U/L 54  87     70      AST 15 - 41 U/L 20  28     25       ALT 0 - 44 U/L 16  19     23          This result is from an external source.   Lab Results  Component Value Date   CEA1 5.8 (H) 02/24/2022   CEA 5.9 11/25/2021   /  CEA  Date Value Ref Range Status  02/24/2022 5.8 (H) 0.0 - 4.7 ng/mL Final    Comment:    (NOTE)                             Nonsmokers          <3.9                             Smokers             <5.6 [employer] Electrochemiluminescence Immunoassay (ECLIA) Values obtained with different assay methods or kits cannot be used interchangeably.  Results cannot be interpreted as absolute evidence of the presence or absence of malignant disease. Performed At: [facility] [address] [place], [place] [id number] Rush Farmer MD RF:[id number]   11/25/2021 5.9  Final    Lab Results  Component Value Date   TIBC 422 04/25/2021   TIBC 417 11/13/2020   FERRITIN 42 04/25/2021   FERRITIN 29 11/13/2020   IRONPCTSAT 13 04/25/2021   IRONPCTSAT 22 11/13/2020   IRONPCTSAT 7.7 (L) 06/19/2020   Lab Results  Component Value Date   LDH 166 06/19/2020    STUDIES:   CT chest with contrast 11/25/2021 Impression: Post treatment changes in the lung bilaterally with a small loculated right pleural effusion, stable. No evidence of recurrent or metastatic disease. Aortic atherosclerosis. Coronary artery calcification. Enlarged pulmonic trunk, indicative of pulmonary arterial hypertension.   Emphysema.  EXAM: 05/20/21 CT CHEST WITH CONTRAST  IMPRESSION:  1. Redemonstration of radiation induced consolidation within the  superior segment right lower lobe. No specific findings of  recurrent/metastatic disease.  2. Since 02/26/2021, significantly improved aeration, most  consistent with resolved infection/inflammation. Favor developing  post infectious/inflammatory scarring within the left upper lobe.  3. Small mediastinal nodes, including decreased size of a dominant  node, nonspecific.  4. Decreased small bilateral pleural effusions.  5. Aortic atherosclerosis (ICD10-I70.0), coronary artery  atherosclerosis and emphysema (ICD10-J43.9).  6. Tiny hiatal hernia.  7. Pulmonary artery enlargement suggests pulmonary arterial  hypertension.   Electronically Signed  By: Abigail Miyamoto M.D.  On: 05/21/2021 08:55    HISTORY:   Allergies:  Allergies  Allergen Reactions   Pregabalin Swelling    Tongue swelling   Paclitaxel    Sulfonamide Derivatives Swelling    Childhood reaction    Current Medications: Current Outpatient Medications  Medication Sig Dispense Refill   apixaban (ELIQUIS) 5 MG TABS tablet Take 5 mg by mouth 2 (two) times daily.     aspirin EC 81 MG tablet Take 2 tablets (162 mg total) by mouth in the morning. Okay to restart this medication on 07/04/2020 30 tablet 11   atorvastatin (LIPITOR) 80 MG tablet Take 80 mg by mouth every evening.      budesonide (PULMICORT) 0.5 MG/2ML nebulizer solution Take by nebulization.     buPROPion (WELLBUTRIN XL) 150 MG 24 hr tablet Take 1 tablet (150 mg total) by mouth daily. 90 tablet 3   diltiazem (CARDIZEM CD) 180 MG 24 hr capsule Take 180 mg by mouth daily.     DULoxetine (CYMBALTA) 60 MG capsule Take 60 mg by mouth daily.     ferrous sulfate 324 MG TBEC Take 324 mg by mouth daily with breakfast.     fish oil-omega-3 fatty acids 1000 MG capsule Take 1,000 mg by mouth in the morning.     folic acid (FOLVITE) 502 MCG  tablet Take 800 mcg by mouth in the morning.     furosemide (LASIX) 40 MG tablet Take 40 mg by mouth every morning.     ipratropium-albuterol (DUONEB) 0.5-2.5 (3) MG/3ML SOLN USE 1 VIAL VIA NEBULIZER EVERY 6 HOURS AS NEEDED 360 mL 11   metoprolol succinate (TOPROL-XL) 50 MG 24 hr tablet Take 75 mg by mouth daily. Take with or immediately following a meal.     mirtazapine (REMERON) 45 MG tablet Take 45 mg by mouth at bedtime.      Multiple Vitamins-Minerals (CENTRUM ADULTS PO) Take by mouth. Centrum silver     nitroGLYCERIN (NITROSTAT) 0.4 MG SL tablet DISSOLVE 1 TABLET UNDER THE TONGUE EVERY 5 MINUTES AS  NEEDED FOR CHEST PAIN. MAX  OF 3 TABLETS IN 15 MINUTES. CALL 911 IF PAIN PERSISTS. 100 tablet 3   ondansetron (ZOFRAN) 4 MG tablet Take 4 mg by mouth every 8 (eight) hours as needed for nausea or vomiting.     pantoprazole (PROTONIX) 40 MG tablet Take 40 mg by mouth in the morning.     potassium chloride SA (KLOR-CON M) 20 MEQ tablet Take 1 tablet (20 mEq total) by mouth 2 (two) times daily. 60 tablet 5   zolpidem (AMBIEN) 5 MG tablet TAKE 1 TABLET BY MOUTH ONCE DAILY AT BEDTIME AS NEEDED FOR SLEEP. 30 tablet 0   Current Facility-Administered Medications  Medication Dose Route Frequency Provider Last Rate Last Admin   sodium chloride flush (NS) 0.9 % injection 10 mL  10 mL Intracatheter PRN Mosher, Vida Roller A, PA-C   10 mL at 02/24/22 1424     I,Jasmine M Lassiter,acting as a Education administrator for Derwood Kaplan, MD.,have  documented all relevant documentation on the behalf of Derwood Kaplan, MD,as directed by  Derwood Kaplan, MD while in the presence of Derwood Kaplan, MD.

## 2022-02-24 ENCOUNTER — Other Ambulatory Visit: Payer: Self-pay | Admitting: Oncology

## 2022-02-24 ENCOUNTER — Inpatient Hospital Stay: Payer: 59

## 2022-02-24 ENCOUNTER — Inpatient Hospital Stay: Payer: 59 | Attending: Internal Medicine | Admitting: Oncology

## 2022-02-24 ENCOUNTER — Encounter: Payer: Self-pay | Admitting: Oncology

## 2022-02-24 VITALS — BP 129/66 | HR 95 | Temp 98.6°F | Resp 20 | Ht 59.0 in | Wt 143.1 lb

## 2022-02-24 DIAGNOSIS — Z7982 Long term (current) use of aspirin: Secondary | ICD-10-CM | POA: Insufficient documentation

## 2022-02-24 DIAGNOSIS — C3431 Malignant neoplasm of lower lobe, right bronchus or lung: Secondary | ICD-10-CM

## 2022-02-24 DIAGNOSIS — M8580 Other specified disorders of bone density and structure, unspecified site: Secondary | ICD-10-CM | POA: Diagnosis not present

## 2022-02-24 DIAGNOSIS — Z8616 Personal history of COVID-19: Secondary | ICD-10-CM | POA: Diagnosis not present

## 2022-02-24 DIAGNOSIS — E876 Hypokalemia: Secondary | ICD-10-CM | POA: Diagnosis not present

## 2022-02-24 DIAGNOSIS — N19 Unspecified kidney failure: Secondary | ICD-10-CM | POA: Diagnosis not present

## 2022-02-24 DIAGNOSIS — J209 Acute bronchitis, unspecified: Secondary | ICD-10-CM | POA: Insufficient documentation

## 2022-02-24 DIAGNOSIS — D509 Iron deficiency anemia, unspecified: Secondary | ICD-10-CM | POA: Diagnosis not present

## 2022-02-24 DIAGNOSIS — Z79899 Other long term (current) drug therapy: Secondary | ICD-10-CM | POA: Diagnosis not present

## 2022-02-24 DIAGNOSIS — Z87891 Personal history of nicotine dependence: Secondary | ICD-10-CM | POA: Insufficient documentation

## 2022-02-24 DIAGNOSIS — Z95828 Presence of other vascular implants and grafts: Secondary | ICD-10-CM | POA: Diagnosis not present

## 2022-02-24 DIAGNOSIS — D539 Nutritional anemia, unspecified: Secondary | ICD-10-CM | POA: Diagnosis not present

## 2022-02-24 DIAGNOSIS — G4700 Insomnia, unspecified: Secondary | ICD-10-CM | POA: Diagnosis not present

## 2022-02-24 DIAGNOSIS — Z923 Personal history of irradiation: Secondary | ICD-10-CM | POA: Insufficient documentation

## 2022-02-24 DIAGNOSIS — Z9221 Personal history of antineoplastic chemotherapy: Secondary | ICD-10-CM | POA: Insufficient documentation

## 2022-02-24 DIAGNOSIS — Z634 Disappearance and death of family member: Secondary | ICD-10-CM | POA: Insufficient documentation

## 2022-02-24 DIAGNOSIS — F32A Depression, unspecified: Secondary | ICD-10-CM | POA: Insufficient documentation

## 2022-02-24 LAB — CBC WITH DIFFERENTIAL (CANCER CENTER ONLY)
Abs Immature Granulocytes: 0.02 10*3/uL (ref 0.00–0.07)
Basophils Absolute: 0.1 10*3/uL (ref 0.0–0.1)
Basophils Relative: 1 %
Eosinophils Absolute: 0.4 10*3/uL (ref 0.0–0.5)
Eosinophils Relative: 5 %
HCT: 35.1 % — ABNORMAL LOW (ref 36.0–46.0)
Hemoglobin: 10.6 g/dL — ABNORMAL LOW (ref 12.0–15.0)
Immature Granulocytes: 0 %
Lymphocytes Relative: 17 %
Lymphs Abs: 1.2 10*3/uL (ref 0.7–4.0)
MCH: 28.3 pg (ref 26.0–34.0)
MCHC: 30.2 g/dL (ref 30.0–36.0)
MCV: 93.9 fL (ref 80.0–100.0)
Monocytes Absolute: 0.6 10*3/uL (ref 0.1–1.0)
Monocytes Relative: 9 %
Neutro Abs: 4.9 10*3/uL (ref 1.7–7.7)
Neutrophils Relative %: 68 %
Platelet Count: 173 10*3/uL (ref 150–400)
RBC: 3.74 MIL/uL — ABNORMAL LOW (ref 3.87–5.11)
RDW: 15.9 % — ABNORMAL HIGH (ref 11.5–15.5)
WBC Count: 7.2 10*3/uL (ref 4.0–10.5)
nRBC: 0 % (ref 0.0–0.2)

## 2022-02-24 LAB — CMP (CANCER CENTER ONLY)
ALT: 16 U/L (ref 0–44)
AST: 20 U/L (ref 15–41)
Albumin: 3.9 g/dL (ref 3.5–5.0)
Alkaline Phosphatase: 54 U/L (ref 38–126)
Anion gap: 7 (ref 5–15)
BUN: 23 mg/dL (ref 8–23)
CO2: 25 mmol/L (ref 22–32)
Calcium: 9.2 mg/dL (ref 8.9–10.3)
Chloride: 109 mmol/L (ref 98–111)
Creatinine: 0.96 mg/dL (ref 0.44–1.00)
GFR, Estimated: 59 mL/min — ABNORMAL LOW (ref >60)
Glucose, Bld: 141 mg/dL — ABNORMAL HIGH (ref 70–99)
Potassium: 4 mmol/L (ref 3.5–5.1)
Sodium: 141 mmol/L (ref 135–145)
Total Bilirubin: 0.7 mg/dL (ref 0.3–1.2)
Total Protein: 6.5 g/dL (ref 6.5–8.1)

## 2022-02-24 MED ORDER — SODIUM CHLORIDE 0.9% FLUSH
10.0000 mL | INTRAVENOUS | Status: DC | PRN
  Administered 2022-02-24: 10 mL

## 2022-02-24 MED ORDER — HEPARIN SOD (PORK) LOCK FLUSH 100 UNIT/ML IV SOLN
500.0000 [IU] | Freq: Once | INTRAVENOUS | Status: AC | PRN
  Administered 2022-02-24: 500 [IU]

## 2022-02-24 MED ORDER — AMOXICILLIN-POT CLAVULANATE 875-125 MG PO TABS: 1.0000 | ORAL_TABLET | Freq: Two times a day (BID) | ORAL | 0 refills | Status: AC

## 2022-02-25 DIAGNOSIS — J449 Chronic obstructive pulmonary disease, unspecified: Secondary | ICD-10-CM | POA: Diagnosis not present

## 2022-02-25 DIAGNOSIS — J9611 Chronic respiratory failure with hypoxia: Secondary | ICD-10-CM | POA: Diagnosis not present

## 2022-02-26 LAB — CEA: CEA: 5.8 ng/mL — ABNORMAL HIGH (ref 0.0–4.7)

## 2022-03-07 ENCOUNTER — Encounter: Payer: Self-pay | Admitting: Hematology and Oncology

## 2022-03-17 DIAGNOSIS — J449 Chronic obstructive pulmonary disease, unspecified: Secondary | ICD-10-CM | POA: Diagnosis not present

## 2022-03-17 DIAGNOSIS — I700 Atherosclerosis of aorta: Secondary | ICD-10-CM | POA: Diagnosis not present

## 2022-03-17 DIAGNOSIS — R531 Weakness: Secondary | ICD-10-CM | POA: Diagnosis not present

## 2022-03-17 DIAGNOSIS — I480 Paroxysmal atrial fibrillation: Secondary | ICD-10-CM | POA: Diagnosis not present

## 2022-03-17 DIAGNOSIS — I509 Heart failure, unspecified: Secondary | ICD-10-CM | POA: Diagnosis not present

## 2022-03-18 ENCOUNTER — Ambulatory Visit: Payer: 59 | Attending: Cardiology | Admitting: Cardiology

## 2022-03-18 ENCOUNTER — Encounter: Payer: Self-pay | Admitting: Cardiology

## 2022-03-18 VITALS — BP 118/58 | HR 46 | Ht 59.0 in | Wt 140.0 lb

## 2022-03-18 DIAGNOSIS — I4719 Other supraventricular tachycardia: Secondary | ICD-10-CM | POA: Diagnosis not present

## 2022-03-18 DIAGNOSIS — I5032 Chronic diastolic (congestive) heart failure: Secondary | ICD-10-CM

## 2022-03-18 NOTE — Patient Instructions (Signed)
Medication Instructions:  Your physician recommends that you continue on your current medications as directed. Please refer to the Current Medication list given to you today.  *If you need a refill on your cardiac medications before your next appointment, please call your pharmacy*   Lab Work: None ordered   Testing/Procedures: None ordered   Follow-Up: At [facility], you and your health needs are our priority.  As part of our continuing mission to provide you with exceptional heart care, we have created designated Provider Care Teams.  These Care Teams include your primary Cardiologist (physician) and Advanced Practice Providers (APPs -  Physician Assistants and Nurse Practitioners) who all work together to provide you with the care you need, when you need it.  Your next appointment:   To be  determined  after reviewing [facility] records  The format for your next appointment:   In Person  Provider:   Allegra Lai, MD    Thank you for choosing [facility]!!   Trinidad Curet, RN [phone]

## 2022-03-18 NOTE — Progress Notes (Signed)
Electrophysiology Office Note   Date:  03/18/2022   ID:  [name], [name] 20-Jan-1941, MRN [id number]  PCP:  Lowella Dandy, NP  Cardiologist:  Johney Frame Primary Electrophysiologist:  Rhiley Solem Meredith Leeds, MD    Chief Complaint: AF   History of Present Illness: [name] is a 82 y.o. female who is being seen today for the evaluation of AF at the request of Harriet Pho, Tessa N, [place]. Presenting today for electrophysiology evaluation.  For coronary artery disease post circumflex stent in 2014, non-STEMI 2020, hypertension, hyperlipidemia, diastolic heart failure, tobacco abuse, PAD, CVA, carotid artery disease, atrial fibrillation.  May 2015 she had SBO and had partial colon resection.  She underwent right carotid enterectomy July Q000111Q complicated by postop stroke.  This resulted in [place].  She was hospitalized December 2022 with chest pressure and was diagnosed with non-STEMI.  Cath showed total occlusion of the OM.  She had faint collateralization.  She was then diagnosed with stage IIIb squamous cell lung cancer and has received chemotherapy.  She has had multiple emergency room visits for shortness of breath.  Per the patient, she states that she gets nebulizers and then her heart rate is fast.  Heart rates have been into the 180s.  She was started on metoprolol and diltiazem.  These have helped for her heart rates.  Today, she denies symptoms of palpitations, chest pain, shortness of breath, orthopnea, PND, lower extremity edema, claudication, dizziness, presyncope, syncope, bleeding, or neurologic sequela. The patient is tolerating medications without difficulties.    Past Medical History:  Diagnosis Date   Acute respiratory failure with hypoxia s/p tracheostomy 07/20/2013   Anemia    a. 7-08/2013 felt due to recent critical illness/chronic disease.   Arthritis    handds, knees & back    B12 deficiency 11/14/2020   Barrett's esophagus    CAD (coronary artery  disease)    a. Mod dz 2010 initially mgd medically. b. 12/2012 - angina s/p PTCA/DES to mid-circumflex, PTCA/DES to first OM.    Cancer [facility])    Carotid artery disease ([place])    a. 60-70% bilat ICA stenosis by dopplers 05/2012.   Chronic back pain    deteriorating;DDD   Colonic obstruction due to suspected colitis; s/p colectomy/colostomy    a. See SBO.   Complication of anesthesia    hard to wake up from anesthesia    COPD (chronic obstructive pulmonary disease) (HCC)    Depression    takes Remeron and Zoloft daily   Dizziness    was on Bentyl which caused this-took off of it and no problems since   DVT of axillary vein, acute right ([place])    a. Dx 07/2013 felt due to R IJ central line that had been inserted 7/14. Not on anticoag due to GIB issues and small cerebral bleed.   GERD (gastroesophageal reflux disease)    Heart murmur    History of bronchitis    > 61yr ago    History of colon polyps    History of hiatal hernia    History of kidney stones    was told it was stable but doesn't know for sure that she ever passed it   HTN (hypertension)    Hypertension    takes Metoprolol daily   [place]    a. 08/11/13 - per DC summary, tiny SAH vs SDH done for altered mentation, anticoag stopped including aspirin.   Joint pain    Neck pain  DDD   Other and unspecified hyperlipidemia    takes Lipitor daily   Pneumonia    hx of->15 yrs ago   Protein-calorie malnutrition, severe w/ electrolyte imbalance    a. Severe hypoalbuminemia leading to 3rd spacing including anasarca and pulm edema 07/2013.   SBO (small bowel obstruction) ([place])    a. Lysis of adhesions and ovarian cystectomy 04/2013 for sBO. b. Admitted 07/2013 for colonic obstruction due to suspected colitis, s/p partial colectomy/colostomy 08/01/13. Admission complicated by anasarca, acute resp failure requiring tracheostomy, decannulated 08/25/13. c. Recurrent SBO8/2015, NGT placed for decompression.    Stroke  [facility]) early 80's   right sided weakness--TIA   Past Surgical History:  Procedure Laterality Date   ABDOMINAL HYSTERECTOMY     APPENDECTOMY     BOWEL RESECTION N/A 08/01/2013   Procedure: SMALL BOWEL RESECTION;  Surgeon: Harl Bowie, MD;  Location: [place];  Service: General;  Laterality: N/A;   BRONCHIAL BIOPSY  07/02/2020   Procedure: BRONCHIAL BIOPSIES;  Surgeon: Collene Gobble, MD;  Location: [facility] ENDOSCOPY;  Service: Pulmonary;;   BRONCHIAL BRUSHINGS  07/02/2020   Procedure: BRONCHIAL BRUSHINGS;  Surgeon: Collene Gobble, MD;  Location: [facility] ENDOSCOPY;  Service: Pulmonary;;   BRONCHIAL NEEDLE ASPIRATION BIOPSY  07/02/2020   Procedure: BRONCHIAL NEEDLE ASPIRATION BIOPSIES;  Surgeon: Collene Gobble, MD;  Location: [place];  Service: Pulmonary;;   BRONCHIAL WASHINGS  07/02/2020   Procedure: BRONCHIAL WASHINGS;  Surgeon: Collene Gobble, MD;  Location: [place] ENDOSCOPY;  Service: Pulmonary;;   cataract surgery Bilateral    CHOLECYSTECTOMY     COLON RESECTION     COLON SURGERY     COLOSTOMY N/A 08/01/2013   Procedure: COLOSTOMY;  Surgeon: Harl Bowie, MD;  Location: [place];  Service: General;  Laterality: N/A;   COLOSTOMY TAKEDOWN  01/24/2014   dr blackman   COLOSTOMY TAKEDOWN N/A 01/24/2014   Procedure: COLOSTOMY TAKEDOWN;  Surgeon: Coralie Keens, MD;  Location: [place];  Service: General;  Laterality: N/A;   CORONARY ANGIOPLASTY WITH STENT PLACEMENT  12/20/2012   STENT TO OM         DR COOPER   ENDARTERECTOMY Right 08/08/2014   Procedure: RIGHT CAROTID ENDARTERECTOMY WITH HEMASHIELD PATCH ANGIOPLASTY;  Surgeon: Elam Dutch, MD;  Location: [place];  Service: Vascular;  Laterality: Right;   FIDUCIAL MARKER PLACEMENT  07/02/2020   Procedure: FIDUCIAL MARKER PLACEMENT;  Surgeon: Collene Gobble, MD;  Location: [place] ENDOSCOPY;  Service: Pulmonary;;   FOOT SURGERY Left    HAND SURGERY Bilateral    for ganglion cysts   HEMOSTASIS CONTROL  07/02/2020   Procedure: HEMOSTASIS CONTROL;   Surgeon: Collene Gobble, MD;  Location: [place] ENDOSCOPY;  Service: Pulmonary;;  ice saline and epi   IR IMAGING GUIDED PORT INSERTION  08/23/2020   LAPAROTOMY N/A 08/01/2013   Procedure: EXPLORATORY LAPAROTOMY;  Surgeon: Harl Bowie, MD;  Location: [place];  Service: General;  Laterality: N/A;   LEFT HEART CATH AND CORONARY ANGIOGRAPHY N/A 01/10/2019   Procedure: LEFT HEART CATH AND CORONARY ANGIOGRAPHY;  Surgeon: Troy Sine, MD;  Location: [place] CV LAB;  Service: Cardiovascular;  Laterality: N/A;   LEFT HEART CATHETERIZATION WITH CORONARY ANGIOGRAM N/A 12/20/2012   Procedure: LEFT HEART CATHETERIZATION WITH CORONARY ANGIOGRAM;  Surgeon: Blane Ohara, MD;  Location: [facility] CATH LAB;  Service: Cardiovascular;  Laterality: N/A;   PARTIAL COLECTOMY N/A 08/01/2013   Procedure: PARTIAL COLECTOMY;  Surgeon: Harl Bowie, MD;  Location: [facility]  OR;  Service: General;  Laterality: N/A;   ROTATOR CUFF REPAIR Left    TONSILLECTOMY     TRACHEOSTOMY     feinstein   VIDEO BRONCHOSCOPY WITH ENDOBRONCHIAL NAVIGATION N/A 07/02/2020   Procedure: VIDEO BRONCHOSCOPY WITH ENDOBRONCHIAL NAVIGATION;  Surgeon: Collene Gobble, MD;  Location: [place] ENDOSCOPY;  Service: Pulmonary;  Laterality: N/A;     Current Outpatient Medications  Medication Sig Dispense Refill   apixaban (ELIQUIS) 5 MG TABS tablet Take 5 mg by mouth 2 (two) times daily.     atorvastatin (LIPITOR) 80 MG tablet Take 80 mg by mouth every evening.      budesonide (PULMICORT) 0.5 MG/2ML nebulizer solution Take by nebulization.     buPROPion (WELLBUTRIN XL) 150 MG 24 hr tablet Take 1 tablet (150 mg total) by mouth daily. 90 tablet 3   diltiazem (CARDIZEM CD) 180 MG 24 hr capsule Take 180 mg by mouth daily.     DULoxetine (CYMBALTA) 60 MG capsule Take 60 mg by mouth daily.     ferrous sulfate 324 MG TBEC Take 324 mg by mouth daily with breakfast. Every other day     fish oil-omega-3 fatty acids 1000 MG capsule Take 1,000 mg by mouth in the  morning.     folic acid (FOLVITE) Q000111Q MCG tablet Take 800 mcg by mouth in the morning.     furosemide (LASIX) 40 MG tablet Take 40 mg by mouth every morning.     ipratropium-albuterol (DUONEB) 0.5-2.5 (3) MG/3ML SOLN USE 1 VIAL VIA NEBULIZER EVERY 6 HOURS AS NEEDED 360 mL 11   metoprolol succinate (TOPROL-XL) 50 MG 24 hr tablet Take 75 mg by mouth daily. Take with or immediately following a meal.     mirtazapine (REMERON) 45 MG tablet Take 45 mg by mouth at bedtime.      Multiple Vitamins-Minerals (CENTRUM ADULTS PO) Take by mouth. Centrum silver     nitroGLYCERIN (NITROSTAT) 0.4 MG SL tablet DISSOLVE 1 TABLET UNDER THE TONGUE EVERY 5 MINUTES AS  NEEDED FOR CHEST PAIN. MAX  OF 3 TABLETS IN 15 MINUTES. CALL 911 IF PAIN PERSISTS. 100 tablet 3   ondansetron (ZOFRAN) 4 MG tablet Take 4 mg by mouth every 8 (eight) hours as needed for nausea or vomiting.     pantoprazole (PROTONIX) 40 MG tablet Take 40 mg by mouth in the morning.     potassium chloride SA (KLOR-CON M) 20 MEQ tablet Take 1 tablet (20 mEq total) by mouth 2 (two) times daily. 60 tablet 5   zolpidem (AMBIEN) 5 MG tablet TAKE 1 TABLET BY MOUTH ONCE DAILY AT BEDTIME AS NEEDED FOR SLEEP. 30 tablet 0   aspirin EC 81 MG tablet Take 2 tablets (162 mg total) by mouth in the morning. Okay to restart this medication on 07/04/2020 (Patient not taking: Reported on 03/18/2022) 30 tablet 11   No current facility-administered medications for this visit.    Allergies:   Pregabalin, Paclitaxel, and Sulfonamide derivatives   Social History:  The patient  reports that she quit smoking about 20 months ago. Her smoking use included cigarettes. She has a 60.00 pack-year smoking history. She has never used smokeless tobacco. She reports that she does not drink alcohol and does not use drugs.   Family History:  The patient's family history includes AAA (abdominal aortic aneurysm) in her father; Heart attack in her father; Heart disease in her father and another  family member; Varicose Veins in her mother.    ROS:  Please  see the history of present illness.   Otherwise, review of systems is positive for none.   All other systems are reviewed and negative.    PHYSICAL EXAM: VS:  BP (!) 118/58   Pulse (!) 46   Ht '4\' 11"'$  (1.499 m)   Wt 140 lb (63.5 kg)   SpO2 98%   BMI 28.28 kg/m  , BMI Body mass index is 28.28 kg/m. GEN: Well nourished, well developed, in no acute distress  HEENT: normal  Neck: no JVD, carotid bruits, or masses Cardiac: RRR; no murmurs, rubs, or gallops,no edema  Respiratory:  clear to auscultation bilaterally, normal work of breathing GI: soft, nontender, nondistended, + BS MS: no deformity or atrophy  Skin: warm and dry Neuro:  Strength and sensation are intact Psych: euthymic mood, full affect  EKG:  EKG is not ordered today. Personal review of the ekg ordered 02/11/22 shows sinus rhythm  Recent Labs: 04/25/2021: TSH 1.435 02/24/2022: ALT 16; BUN 23; Creatinine 0.96; Hemoglobin 10.6; Platelet Count 173; Potassium 4.0; Sodium 141    Lipid Panel     Component Value Date/Time   CHOL 117 01/09/2019 0053   CHOL 150 01/30/2016 1052   TRIG 86 01/09/2019 0053   HDL 42 01/09/2019 0053   HDL 38 (L) 01/30/2016 1052   CHOLHDL 2.8 01/09/2019 0053   VLDL 17 01/09/2019 0053   LDLCALC 58 01/09/2019 0053   LDLCALC 84 01/30/2016 1052     Wt Readings from Last 3 Encounters:  03/18/22 140 lb (63.5 kg)  02/24/22 143 lb 1.6 oz (64.9 kg)  02/11/22 139 lb (63 kg)      Other studies Reviewed: Additional studies/ records that were reviewed today include: TTE 10/18/21  Review of the above records today demonstrates:  Ejection fraction 60 to [id number] Impaired diastolic relaxation Mildly dilated left atrium PA pressure mildly elevated, 43 mmHg Mild mitral regurgitation   ASSESSMENT AND PLAN:  1.  Tachycardia: Unclear as to the cause of her tachycardia.  This could be atrial fibrillation but she also has a history of SVT.  It  sounds like it was exacerbated by nebulizer therapy.  Ordered to get the results from her hospitalization at [facility].  2.  CVA: Post endarterectomy.  No current symptoms.  3.  COPD: On 4 L of oxygen.  Plan per pulmonary  4.  Chronic diastolic heart failure: Euvolemic on exam.  Plan per primary cardiology.  5.  Coronary artery disease: No current chest pain   Current medicines are reviewed at length with the patient today.   The patient does not have concerns regarding her medicines.  The following changes were made today:  none  Labs/ tests ordered today include:  No orders of the defined types were placed in this encounter.    Disposition:   FU with Arieal [name] [place] pending [facility] results  Signed, Sharra Cayabyab Meredith Leeds, MD  03/18/2022 3:19 PM     [facility] [address] [place] [address] [phone] (office) [phone] (fax)

## 2022-03-20 DIAGNOSIS — J449 Chronic obstructive pulmonary disease, unspecified: Secondary | ICD-10-CM | POA: Diagnosis not present

## 2022-03-20 DIAGNOSIS — I10 Essential (primary) hypertension: Secondary | ICD-10-CM | POA: Diagnosis not present

## 2022-03-20 DIAGNOSIS — E785 Hyperlipidemia, unspecified: Secondary | ICD-10-CM | POA: Diagnosis not present

## 2022-03-25 ENCOUNTER — Other Ambulatory Visit: Payer: Self-pay | Admitting: Oncology

## 2022-03-25 DIAGNOSIS — G4700 Insomnia, unspecified: Secondary | ICD-10-CM

## 2022-03-25 MED ORDER — ZOLPIDEM TARTRATE 5 MG PO TABS: ORAL_TABLET | ORAL | 0 refills | Status: DC

## 2022-03-26 ENCOUNTER — Telehealth: Payer: Self-pay | Admitting: Cardiology

## 2022-03-26 NOTE — Telephone Encounter (Signed)
Patient is returning call.  °

## 2022-03-26 NOTE — Telephone Encounter (Signed)
Aware records reviewed. Advised yearly follow up at this time, recall placed in epic. Advised to call if she has any issues with her HRs. Advised to keep follow up with the PA in April. Patient verbalized understanding and agreeable to plan.

## 2022-04-15 DIAGNOSIS — I10 Essential (primary) hypertension: Secondary | ICD-10-CM | POA: Diagnosis not present

## 2022-04-15 DIAGNOSIS — I700 Atherosclerosis of aorta: Secondary | ICD-10-CM | POA: Diagnosis not present

## 2022-04-15 DIAGNOSIS — R739 Hyperglycemia, unspecified: Secondary | ICD-10-CM | POA: Diagnosis not present

## 2022-04-15 DIAGNOSIS — J449 Chronic obstructive pulmonary disease, unspecified: Secondary | ICD-10-CM | POA: Diagnosis not present

## 2022-04-15 DIAGNOSIS — R531 Weakness: Secondary | ICD-10-CM | POA: Diagnosis not present

## 2022-04-15 DIAGNOSIS — E785 Hyperlipidemia, unspecified: Secondary | ICD-10-CM | POA: Diagnosis not present

## 2022-04-15 DIAGNOSIS — I480 Paroxysmal atrial fibrillation: Secondary | ICD-10-CM | POA: Diagnosis not present

## 2022-04-28 ENCOUNTER — Other Ambulatory Visit: Payer: Self-pay | Admitting: Oncology

## 2022-04-28 DIAGNOSIS — G4700 Insomnia, unspecified: Secondary | ICD-10-CM

## 2022-05-02 ENCOUNTER — Telehealth: Payer: Self-pay | Admitting: Oncology

## 2022-05-02 NOTE — Telephone Encounter (Signed)
CT Chest has been scheduled for 05/26/22 @ 2:00; Checking in @ 1 pm   LVM requesting pt call me back to provide her appt date,time and instructions.

## 2022-05-02 NOTE — Telephone Encounter (Signed)
lvm

## 2022-05-06 DIAGNOSIS — S0003XA Contusion of scalp, initial encounter: Secondary | ICD-10-CM | POA: Diagnosis not present

## 2022-05-06 DIAGNOSIS — I639 Cerebral infarction, unspecified: Secondary | ICD-10-CM | POA: Diagnosis not present

## 2022-05-06 DIAGNOSIS — W19XXXA Unspecified fall, initial encounter: Secondary | ICD-10-CM | POA: Diagnosis not present

## 2022-05-06 DIAGNOSIS — S0990XA Unspecified injury of head, initial encounter: Secondary | ICD-10-CM | POA: Diagnosis not present

## 2022-05-06 DIAGNOSIS — Z7901 Long term (current) use of anticoagulants: Secondary | ICD-10-CM | POA: Diagnosis not present

## 2022-05-06 DIAGNOSIS — Z043 Encounter for examination and observation following other accident: Secondary | ICD-10-CM | POA: Diagnosis not present

## 2022-05-12 ENCOUNTER — Telehealth: Payer: Self-pay

## 2022-05-12 DIAGNOSIS — I480 Paroxysmal atrial fibrillation: Secondary | ICD-10-CM | POA: Diagnosis not present

## 2022-05-12 DIAGNOSIS — G4700 Insomnia, unspecified: Secondary | ICD-10-CM | POA: Diagnosis not present

## 2022-05-12 DIAGNOSIS — I10 Essential (primary) hypertension: Secondary | ICD-10-CM | POA: Diagnosis not present

## 2022-05-12 DIAGNOSIS — R531 Weakness: Secondary | ICD-10-CM | POA: Diagnosis not present

## 2022-05-12 NOTE — Telephone Encounter (Signed)
     Patient  visit on 05/06/2022 at [facility] was for fall.  Have you been able to follow up with your primary care physician? Yes  The patient was or was not able to obtain any needed medicine or equipment. No medication prescribed.  Are there diet recommendations that you are having difficulty following? No  Patient expresses understanding of discharge instructions and education provided has no other needs at this time. Yes   Linell Meldrum [facility]  [facility] Population Health Community Resource Care Guide   [email or web address]  ?? [phone]   Website: [email or web address]  [email or web address]

## 2022-05-13 ENCOUNTER — Ambulatory Visit: Payer: 59 | Admitting: Physician Assistant

## 2022-05-15 ENCOUNTER — Encounter: Payer: Self-pay | Admitting: Hematology and Oncology

## 2022-05-15 NOTE — Telephone Encounter (Signed)
Closed

## 2022-05-16 ENCOUNTER — Telehealth: Payer: Self-pay | Admitting: Oncology

## 2022-05-16 NOTE — Telephone Encounter (Signed)
CT Chest has been scheduled for 05/26/22 @ 2:00; Checking in @ 1 pm   Notified pt of date,time and instructions.

## 2022-05-18 ENCOUNTER — Emergency Department (HOSPITAL_COMMUNITY): Payer: 59

## 2022-05-18 ENCOUNTER — Other Ambulatory Visit: Payer: Self-pay

## 2022-05-18 ENCOUNTER — Inpatient Hospital Stay (HOSPITAL_COMMUNITY): Payer: 59

## 2022-05-18 ENCOUNTER — Inpatient Hospital Stay (HOSPITAL_COMMUNITY)
Admission: EM | Admit: 2022-05-18 | Discharge: 2022-05-21 | DRG: 208 | Disposition: E | Payer: 59 | Attending: Pulmonary Disease | Admitting: Pulmonary Disease

## 2022-05-18 DIAGNOSIS — Z86718 Personal history of other venous thrombosis and embolism: Secondary | ICD-10-CM

## 2022-05-18 DIAGNOSIS — E440 Moderate protein-calorie malnutrition: Secondary | ICD-10-CM | POA: Diagnosis present

## 2022-05-18 DIAGNOSIS — R531 Weakness: Secondary | ICD-10-CM | POA: Diagnosis not present

## 2022-05-18 DIAGNOSIS — Z90710 Acquired absence of both cervix and uterus: Secondary | ICD-10-CM

## 2022-05-18 DIAGNOSIS — J9621 Acute and chronic respiratory failure with hypoxia: Principal | ICD-10-CM | POA: Diagnosis present

## 2022-05-18 DIAGNOSIS — Z66 Do not resuscitate: Secondary | ICD-10-CM | POA: Diagnosis present

## 2022-05-18 DIAGNOSIS — I4891 Unspecified atrial fibrillation: Secondary | ICD-10-CM | POA: Diagnosis present

## 2022-05-18 DIAGNOSIS — Z923 Personal history of irradiation: Secondary | ICD-10-CM

## 2022-05-18 DIAGNOSIS — Z9981 Dependence on supplemental oxygen: Secondary | ICD-10-CM

## 2022-05-18 DIAGNOSIS — I2699 Other pulmonary embolism without acute cor pulmonale: Secondary | ICD-10-CM | POA: Diagnosis not present

## 2022-05-18 DIAGNOSIS — E785 Hyperlipidemia, unspecified: Secondary | ICD-10-CM | POA: Diagnosis present

## 2022-05-18 DIAGNOSIS — Z9049 Acquired absence of other specified parts of digestive tract: Secondary | ICD-10-CM

## 2022-05-18 DIAGNOSIS — Z955 Presence of coronary angioplasty implant and graft: Secondary | ICD-10-CM

## 2022-05-18 DIAGNOSIS — Z7901 Long term (current) use of anticoagulants: Secondary | ICD-10-CM | POA: Diagnosis not present

## 2022-05-18 DIAGNOSIS — Z8249 Family history of ischemic heart disease and other diseases of the circulatory system: Secondary | ICD-10-CM

## 2022-05-18 DIAGNOSIS — I110 Hypertensive heart disease with heart failure: Secondary | ICD-10-CM | POA: Diagnosis present

## 2022-05-18 DIAGNOSIS — Z1152 Encounter for screening for COVID-19: Secondary | ICD-10-CM | POA: Diagnosis not present

## 2022-05-18 DIAGNOSIS — I2510 Atherosclerotic heart disease of native coronary artery without angina pectoris: Secondary | ICD-10-CM | POA: Diagnosis present

## 2022-05-18 DIAGNOSIS — J9622 Acute and chronic respiratory failure with hypercapnia: Secondary | ICD-10-CM | POA: Diagnosis present

## 2022-05-18 DIAGNOSIS — Z882 Allergy status to sulfonamides status: Secondary | ICD-10-CM | POA: Diagnosis not present

## 2022-05-18 DIAGNOSIS — T380X5A Adverse effect of glucocorticoids and synthetic analogues, initial encounter: Secondary | ICD-10-CM | POA: Diagnosis present

## 2022-05-18 DIAGNOSIS — Z9221 Personal history of antineoplastic chemotherapy: Secondary | ICD-10-CM | POA: Diagnosis not present

## 2022-05-18 DIAGNOSIS — I499 Cardiac arrhythmia, unspecified: Secondary | ICD-10-CM | POA: Diagnosis not present

## 2022-05-18 DIAGNOSIS — K2270 Barrett's esophagus without dysplasia: Secondary | ICD-10-CM | POA: Diagnosis present

## 2022-05-18 DIAGNOSIS — Z515 Encounter for palliative care: Secondary | ICD-10-CM | POA: Diagnosis not present

## 2022-05-18 DIAGNOSIS — I5033 Acute on chronic diastolic (congestive) heart failure: Secondary | ICD-10-CM | POA: Diagnosis present

## 2022-05-18 DIAGNOSIS — J9690 Respiratory failure, unspecified, unspecified whether with hypoxia or hypercapnia: Secondary | ICD-10-CM | POA: Diagnosis present

## 2022-05-18 DIAGNOSIS — E876 Hypokalemia: Secondary | ICD-10-CM | POA: Diagnosis present

## 2022-05-18 DIAGNOSIS — J441 Chronic obstructive pulmonary disease with (acute) exacerbation: Secondary | ICD-10-CM | POA: Diagnosis present

## 2022-05-18 DIAGNOSIS — I214 Non-ST elevation (NSTEMI) myocardial infarction: Secondary | ICD-10-CM | POA: Diagnosis present

## 2022-05-18 DIAGNOSIS — D72829 Elevated white blood cell count, unspecified: Secondary | ICD-10-CM | POA: Diagnosis present

## 2022-05-18 DIAGNOSIS — Z7951 Long term (current) use of inhaled steroids: Secondary | ICD-10-CM

## 2022-05-18 DIAGNOSIS — Z87891 Personal history of nicotine dependence: Secondary | ICD-10-CM

## 2022-05-18 DIAGNOSIS — R0602 Shortness of breath: Secondary | ICD-10-CM | POA: Diagnosis present

## 2022-05-18 DIAGNOSIS — N179 Acute kidney failure, unspecified: Secondary | ICD-10-CM | POA: Diagnosis present

## 2022-05-18 DIAGNOSIS — Z79899 Other long term (current) drug therapy: Secondary | ICD-10-CM | POA: Diagnosis not present

## 2022-05-18 DIAGNOSIS — K219 Gastro-esophageal reflux disease without esophagitis: Secondary | ICD-10-CM | POA: Diagnosis present

## 2022-05-18 DIAGNOSIS — R059 Cough, unspecified: Secondary | ICD-10-CM | POA: Diagnosis not present

## 2022-05-18 DIAGNOSIS — I252 Old myocardial infarction: Secondary | ICD-10-CM

## 2022-05-18 DIAGNOSIS — R54 Age-related physical debility: Secondary | ICD-10-CM | POA: Diagnosis present

## 2022-05-18 DIAGNOSIS — E8729 Other acidosis: Secondary | ICD-10-CM | POA: Diagnosis present

## 2022-05-18 DIAGNOSIS — R0902 Hypoxemia: Secondary | ICD-10-CM | POA: Diagnosis not present

## 2022-05-18 DIAGNOSIS — Z85118 Personal history of other malignant neoplasm of bronchus and lung: Secondary | ICD-10-CM

## 2022-05-18 DIAGNOSIS — Z743 Need for continuous supervision: Secondary | ICD-10-CM | POA: Diagnosis not present

## 2022-05-18 DIAGNOSIS — Z4659 Encounter for fitting and adjustment of other gastrointestinal appliance and device: Secondary | ICD-10-CM | POA: Diagnosis not present

## 2022-05-18 DIAGNOSIS — R509 Fever, unspecified: Secondary | ICD-10-CM | POA: Diagnosis not present

## 2022-05-18 DIAGNOSIS — Z888 Allergy status to other drugs, medicaments and biological substances status: Secondary | ICD-10-CM

## 2022-05-18 DIAGNOSIS — Z8673 Personal history of transient ischemic attack (TIA), and cerebral infarction without residual deficits: Secondary | ICD-10-CM

## 2022-05-18 DIAGNOSIS — Z4682 Encounter for fitting and adjustment of non-vascular catheter: Secondary | ICD-10-CM | POA: Diagnosis not present

## 2022-05-18 LAB — TROPONIN I (HIGH SENSITIVITY): Troponin I (High Sensitivity): 487 ng/L (ref <18)

## 2022-05-18 LAB — COMPREHENSIVE METABOLIC PANEL
ALT: 18 U/L (ref 0–44)
AST: 27 U/L (ref 15–41)
Albumin: 3 g/dL — ABNORMAL LOW (ref 3.5–5.0)
Alkaline Phosphatase: 75 U/L (ref 38–126)
Anion gap: 13 (ref 5–15)
BUN: 12 mg/dL (ref 8–23)
CO2: 21 mmol/L — ABNORMAL LOW (ref 22–32)
Calcium: 9.1 mg/dL (ref 8.9–10.3)
Chloride: 108 mmol/L (ref 98–111)
Creatinine, Ser: 0.98 mg/dL (ref 0.44–1.00)
GFR, Estimated: 57 mL/min — ABNORMAL LOW (ref >60)
Glucose, Bld: 175 mg/dL — ABNORMAL HIGH (ref 70–99)
Potassium: 2.6 mmol/L — CL (ref 3.5–5.1)
Sodium: 142 mmol/L (ref 135–145)
Total Bilirubin: 0.6 mg/dL (ref 0.3–1.2)
Total Protein: 5.7 g/dL — ABNORMAL LOW (ref 6.5–8.1)

## 2022-05-18 LAB — RESP PANEL BY RT-PCR (RSV, FLU A&B, COVID)  RVPGX2
Influenza A by PCR: NEGATIVE
Influenza B by PCR: NEGATIVE
Resp Syncytial Virus by PCR: NEGATIVE
SARS Coronavirus 2 by RT PCR: NEGATIVE

## 2022-05-18 LAB — CBC WITH DIFFERENTIAL/PLATELET
Abs Immature Granulocytes: 0.01 10*3/uL (ref 0.00–0.07)
Basophils Absolute: 0.1 10*3/uL (ref 0.0–0.1)
Basophils Relative: 1 %
Eosinophils Absolute: 0.3 10*3/uL (ref 0.0–0.5)
Eosinophils Relative: 5 %
HCT: 28.9 % — ABNORMAL LOW (ref 36.0–46.0)
Hemoglobin: 9.1 g/dL — ABNORMAL LOW (ref 12.0–15.0)
Immature Granulocytes: 0 %
Lymphocytes Relative: 32 %
Lymphs Abs: 2.2 10*3/uL (ref 0.7–4.0)
MCH: 27.7 pg (ref 26.0–34.0)
MCHC: 31.5 g/dL (ref 30.0–36.0)
MCV: 87.8 fL (ref 80.0–100.0)
Monocytes Absolute: 0.7 10*3/uL (ref 0.1–1.0)
Monocytes Relative: 10 %
Neutro Abs: 3.7 10*3/uL (ref 1.7–7.7)
Neutrophils Relative %: 52 %
Platelets: 144 10*3/uL — ABNORMAL LOW (ref 150–400)
RBC: 3.29 MIL/uL — ABNORMAL LOW (ref 3.87–5.11)
RDW: 17.1 % — ABNORMAL HIGH (ref 11.5–15.5)
WBC: 7 10*3/uL (ref 4.0–10.5)
nRBC: 0 % (ref 0.0–0.2)

## 2022-05-18 LAB — I-STAT VENOUS BLOOD GAS, ED
Acid-base deficit: 3 mmol/L — ABNORMAL HIGH (ref 0.0–2.0)
Bicarbonate: 21.5 mmol/L (ref 20.0–28.0)
Calcium, Ion: 1.24 mmol/L (ref 1.15–1.40)
HCT: 28 % — ABNORMAL LOW (ref 36.0–46.0)
Hemoglobin: 9.5 g/dL — ABNORMAL LOW (ref 12.0–15.0)
O2 Saturation: 77 %
Potassium: 2.7 mmol/L — CL (ref 3.5–5.1)
Sodium: 144 mmol/L (ref 135–145)
TCO2: 23 mmol/L (ref 22–32)
pCO2, Ven: 34.9 mmHg — ABNORMAL LOW (ref 44–60)
pH, Ven: 7.397 (ref 7.25–7.43)
pO2, Ven: 41 mmHg (ref 32–45)

## 2022-05-18 LAB — APTT: aPTT: 29 seconds (ref 24–36)

## 2022-05-18 LAB — PROTIME-INR
INR: 1.2 (ref 0.8–1.2)
Prothrombin Time: 14.9 seconds (ref 11.4–15.2)

## 2022-05-18 LAB — LACTIC ACID, PLASMA: Lactic Acid, Venous: 2.2 mmol/L (ref 0.5–1.9)

## 2022-05-18 LAB — BRAIN NATRIURETIC PEPTIDE: B Natriuretic Peptide: 535.2 pg/mL — ABNORMAL HIGH (ref 0.0–100.0)

## 2022-05-18 MED ORDER — ARFORMOTEROL TARTRATE 15 MCG/2ML IN NEBU
15.0000 ug | INHALATION_SOLUTION | Freq: Two times a day (BID) | RESPIRATORY_TRACT | Status: DC
  Administered 2022-05-19 (×3): 15 ug via RESPIRATORY_TRACT
  Filled 2022-05-18 (×3): qty 2

## 2022-05-18 MED ORDER — DILTIAZEM LOAD VIA INFUSION
10.0000 mg | Freq: Once | INTRAVENOUS | Status: AC
  Administered 2022-05-18: 10 mg via INTRAVENOUS
  Filled 2022-05-18: qty 10

## 2022-05-18 MED ORDER — AMIODARONE HCL IN DEXTROSE 360-4.14 MG/200ML-% IV SOLN
60.0000 mg/h | INTRAVENOUS | Status: AC
  Administered 2022-05-19: 60 mg/h via INTRAVENOUS
  Filled 2022-05-18 (×2): qty 200

## 2022-05-18 MED ORDER — LORAZEPAM 2 MG/ML IJ SOLN
0.5000 mg | Freq: Once | INTRAMUSCULAR | Status: AC
  Administered 2022-05-18: 0.5 mg via INTRAVENOUS
  Filled 2022-05-18: qty 1

## 2022-05-18 MED ORDER — ORAL CARE MOUTH RINSE
15.0000 mL | OROMUCOSAL | Status: DC
  Administered 2022-05-19 (×9): 15 mL via OROMUCOSAL

## 2022-05-18 MED ORDER — HALOPERIDOL LACTATE 5 MG/ML IJ SOLN
5.0000 mg | Freq: Once | INTRAMUSCULAR | Status: AC
  Administered 2022-05-18: 5 mg via INTRAVENOUS

## 2022-05-18 MED ORDER — ALBUTEROL SULFATE (2.5 MG/3ML) 0.083% IN NEBU
10.0000 mg | INHALATION_SOLUTION | RESPIRATORY_TRACT | Status: DC
  Administered 2022-05-18: 10 mg via RESPIRATORY_TRACT

## 2022-05-18 MED ORDER — POTASSIUM CHLORIDE 20 MEQ PO PACK
40.0000 meq | PACK | Freq: Once | ORAL | Status: AC
  Administered 2022-05-19: 40 meq
  Filled 2022-05-18: qty 2

## 2022-05-18 MED ORDER — FENTANYL CITRATE PF 50 MCG/ML IJ SOSY
25.0000 ug | PREFILLED_SYRINGE | INTRAMUSCULAR | Status: DC | PRN
  Administered 2022-05-19 (×3): 50 ug via INTRAVENOUS
  Filled 2022-05-18 (×3): qty 1

## 2022-05-18 MED ORDER — KETAMINE HCL 50 MG/5ML IJ SOSY
PREFILLED_SYRINGE | INTRAMUSCULAR | Status: AC
  Filled 2022-05-18: qty 10

## 2022-05-18 MED ORDER — MIDAZOLAM HCL 2 MG/2ML IJ SOLN
INTRAMUSCULAR | Status: AC
  Filled 2022-05-18: qty 2

## 2022-05-18 MED ORDER — REVEFENACIN 175 MCG/3ML IN SOLN
175.0000 ug | Freq: Every day | RESPIRATORY_TRACT | Status: DC
  Administered 2022-05-19: 175 ug via RESPIRATORY_TRACT
  Filled 2022-05-18: qty 3

## 2022-05-18 MED ORDER — ETOMIDATE 2 MG/ML IV SOLN
INTRAVENOUS | Status: AC
  Filled 2022-05-18: qty 20

## 2022-05-18 MED ORDER — FENTANYL CITRATE PF 50 MCG/ML IJ SOSY
PREFILLED_SYRINGE | INTRAMUSCULAR | Status: AC
  Filled 2022-05-18: qty 2

## 2022-05-18 MED ORDER — SODIUM CHLORIDE 0.9 % IV SOLN
500.0000 mg | INTRAVENOUS | Status: DC
  Administered 2022-05-18: 500 mg via INTRAVENOUS
  Filled 2022-05-18: qty 5

## 2022-05-18 MED ORDER — DOCUSATE SODIUM 100 MG PO CAPS: 100.0000 mg | ORAL_CAPSULE | Freq: Two times a day (BID) | ORAL | Status: DC | PRN

## 2022-05-18 MED ORDER — ETOMIDATE 2 MG/ML IV SOLN
INTRAVENOUS | Status: AC | PRN
  Administered 2022-05-18: 20 mg via INTRAVENOUS

## 2022-05-18 MED ORDER — FAMOTIDINE 20 MG PO TABS
20.0000 mg | ORAL_TABLET | Freq: Two times a day (BID) | ORAL | Status: DC
  Administered 2022-05-19: 20 mg
  Filled 2022-05-18: qty 1

## 2022-05-18 MED ORDER — IPRATROPIUM BROMIDE 0.02 % IN SOLN
0.5000 mg | Freq: Once | RESPIRATORY_TRACT | Status: AC
  Administered 2022-05-18: 0.5 mg via RESPIRATORY_TRACT

## 2022-05-18 MED ORDER — BUDESONIDE 0.5 MG/2ML IN SUSP
0.5000 mg | Freq: Two times a day (BID) | RESPIRATORY_TRACT | Status: DC
  Administered 2022-05-19 (×3): 0.5 mg via RESPIRATORY_TRACT
  Filled 2022-05-18 (×3): qty 2

## 2022-05-18 MED ORDER — ROCURONIUM BROMIDE 50 MG/5ML IV SOLN
INTRAVENOUS | Status: AC | PRN
  Administered 2022-05-18: 50 mg via INTRAVENOUS

## 2022-05-18 MED ORDER — PROPOFOL 1000 MG/100ML IV EMUL: 0.0000 ug/kg/min | INTRAVENOUS | Status: DC

## 2022-05-18 MED ORDER — MAGNESIUM SULFATE 2 GM/50ML IV SOLN
2.0000 g | Freq: Once | INTRAVENOUS | Status: AC
  Administered 2022-05-18: 2 g via INTRAVENOUS
  Filled 2022-05-18: qty 50

## 2022-05-18 MED ORDER — AMIODARONE HCL IN DEXTROSE 360-4.14 MG/200ML-% IV SOLN
30.0000 mg/h | INTRAVENOUS | Status: DC
  Administered 2022-05-19: 59.9999 mg/h via INTRAVENOUS
  Administered 2022-05-19: 30 mg/h via INTRAVENOUS
  Filled 2022-05-18: qty 200

## 2022-05-18 MED ORDER — LACTATED RINGERS IV SOLN: INTRAVENOUS | Status: DC

## 2022-05-18 MED ORDER — SUCCINYLCHOLINE CHLORIDE 200 MG/10ML IV SOSY
PREFILLED_SYRINGE | INTRAVENOUS | Status: AC
  Filled 2022-05-18: qty 10

## 2022-05-18 MED ORDER — ACETAMINOPHEN 500 MG PO TABS
1000.0000 mg | ORAL_TABLET | Freq: Once | ORAL | Status: AC
  Administered 2022-05-18: 1000 mg via ORAL
  Filled 2022-05-18: qty 2

## 2022-05-18 MED ORDER — HALOPERIDOL LACTATE 5 MG/ML IJ SOLN
INTRAMUSCULAR | Status: AC
  Filled 2022-05-18: qty 1

## 2022-05-18 MED ORDER — IPRATROPIUM BROMIDE 0.03 % NA SOLN
2.0000 | Freq: Once | NASAL | Status: DC
  Filled 2022-05-18: qty 30

## 2022-05-18 MED ORDER — LORAZEPAM 2 MG/ML IJ SOLN
0.5000 mg | Freq: Once | INTRAMUSCULAR | Status: AC
  Administered 2022-05-18: 0.5 mg via INTRAVENOUS

## 2022-05-18 MED ORDER — DEXMEDETOMIDINE HCL IN NACL 400 MCG/100ML IV SOLN
0.0000 ug/kg/h | INTRAVENOUS | Status: DC
  Administered 2022-05-18: 0.4 ug/kg/h via INTRAVENOUS
  Administered 2022-05-19: 0.8 ug/kg/h via INTRAVENOUS
  Administered 2022-05-19: 1 ug/kg/h via INTRAVENOUS
  Filled 2022-05-18 (×3): qty 100

## 2022-05-18 MED ORDER — ORAL CARE MOUTH RINSE: 15.0000 mL | OROMUCOSAL | Status: DC | PRN

## 2022-05-18 MED ORDER — METHYLPREDNISOLONE SODIUM SUCC 125 MG IJ SOLR
80.0000 mg | Freq: Two times a day (BID) | INTRAMUSCULAR | Status: DC
  Administered 2022-05-19: 80 mg via INTRAVENOUS
  Filled 2022-05-18: qty 2

## 2022-05-18 MED ORDER — FENTANYL CITRATE PF 50 MCG/ML IJ SOSY: 25.0000 ug | PREFILLED_SYRINGE | INTRAMUSCULAR | Status: DC | PRN

## 2022-05-18 MED ORDER — POLYETHYLENE GLYCOL 3350 17 G PO PACK: 17.0000 g | PACK | Freq: Every day | ORAL | Status: DC | PRN

## 2022-05-18 MED ORDER — POTASSIUM CHLORIDE 10 MEQ/100ML IV SOLN
10.0000 meq | INTRAVENOUS | Status: DC
  Administered 2022-05-18: 10 meq via INTRAVENOUS
  Filled 2022-05-18 (×2): qty 100

## 2022-05-18 MED ORDER — METHYLPREDNISOLONE SODIUM SUCC 125 MG IJ SOLR
125.0000 mg | Freq: Once | INTRAMUSCULAR | Status: AC
  Administered 2022-05-18: 125 mg via INTRAVENOUS
  Filled 2022-05-18: qty 2

## 2022-05-18 MED ORDER — ROCURONIUM BROMIDE 10 MG/ML (PF) SYRINGE
PREFILLED_SYRINGE | INTRAVENOUS | Status: AC
  Filled 2022-05-18: qty 10

## 2022-05-18 MED ORDER — POTASSIUM CHLORIDE 10 MEQ/50ML IV SOLN
10.0000 meq | INTRAVENOUS | Status: AC
  Administered 2022-05-19 (×5): 10 meq via INTRAVENOUS
  Filled 2022-05-18 (×11): qty 50

## 2022-05-18 MED ORDER — SODIUM CHLORIDE 0.9 % IV SOLN
2.0000 g | INTRAVENOUS | Status: DC
  Administered 2022-05-18 – 2022-05-19 (×2): 2 g via INTRAVENOUS
  Filled 2022-05-18 (×2): qty 20

## 2022-05-18 MED ORDER — DILTIAZEM HCL-DEXTROSE 125-5 MG/125ML-% IV SOLN (PREMIX)
5.0000 mg/h | INTRAVENOUS | Status: DC
  Administered 2022-05-18: 5 mg/h via INTRAVENOUS
  Filled 2022-05-18: qty 125

## 2022-05-18 MED ORDER — APIXABAN 5 MG PO TABS
5.0000 mg | ORAL_TABLET | Freq: Two times a day (BID) | ORAL | Status: DC
  Administered 2022-05-19 (×2): 5 mg
  Filled 2022-05-18 (×2): qty 1

## 2022-05-18 NOTE — ED Provider Notes (Signed)
[place] EMERGENCY DEPARTMENT AT [facility] Provider Note   CSN: [id number] Arrival date & time: 04/22/2022  1847     History  Chief Complaint  Patient presents with   Shortness of Breath    [name] is a 82 y.o. female.  Patient is an 82 year old female with a history of lung cancer status post chemo and radiation with good response not currently receiving therapy, CAD status post stents, DVT, COPD, prior stroke and intracranial hemorrhage, GERD who is presenting today with complaints of shortness of breath.  Patient reports symptoms started a few days ago with worsening shortness of breath and cough.  She has had minimal sputum production but her inhaler at home is not seeming to work.  She has not noticed any swelling in her lower extremities or recent medication changes.  She denies any chest pain, abdominal pain, nausea or vomiting.  When EMS arrived today they noted the patient had wheezing and rhonchi throughout bilateral lung fields and gave patient 15 mg of albuterol and 1 Atrovent which patient reports has made her feel better.  She normally wears 4 L of oxygen all the time.  The history is provided by the patient and the EMS personnel.  Shortness of Breath      Home Medications Prior to Admission medications   Medication Sig Start Date End Date Taking? Authorizing Provider  apixaban (ELIQUIS) 5 MG TABS tablet Take 5 mg by mouth 2 (two) times daily.    [provider]  aspirin EC 81 MG tablet Take 2 tablets (162 mg total) by mouth in the morning. Okay to restart this medication on 07/04/2020 Patient not taking: Reported on 03/18/2022 07/02/20   Leslye Peer, MD  atorvastatin (LIPITOR) 80 MG tablet Take 80 mg by mouth every evening.     [provider]  budesonide (PULMICORT) 0.5 MG/2ML nebulizer solution Take by nebulization. 02/28/21   [provider]  buPROPion (WELLBUTRIN XL) 150 MG 24 hr tablet Take 1 tablet (150 mg total)  by mouth daily. 03/02/15   Lars Masson, MD  diltiazem (CARDIZEM CD) 180 MG 24 hr capsule Take 180 mg by mouth daily.    [provider]  DULoxetine (CYMBALTA) 60 MG capsule Take 60 mg by mouth daily. 11/07/21   [provider]  ferrous sulfate 324 MG TBEC Take 324 mg by mouth daily with breakfast. Every other day    [provider]  fish oil-omega-3 fatty acids 1000 MG capsule Take 1,000 mg by mouth in the morning.    [provider]  folic acid (FOLVITE) 800 MCG tablet Take 800 mcg by mouth in the morning.    [provider]  furosemide (LASIX) 40 MG tablet Take 40 mg by mouth every morning. 01/16/22   [provider]  ipratropium-albuterol (DUONEB) 0.5-2.5 (3) MG/3ML SOLN USE 1 VIAL VIA NEBULIZER EVERY 6 HOURS AS NEEDED 04/02/21   Glenford Bayley, NP  metoprolol succinate (TOPROL-XL) 50 MG 24 hr tablet Take 75 mg by mouth daily. Take with or immediately following a meal.    [provider]  mirtazapine (REMERON) 45 MG tablet Take 45 mg by mouth at bedtime.  06/29/18   [provider]  Multiple Vitamins-Minerals (CENTRUM ADULTS PO) Take by mouth. Centrum silver    [provider]  nitroGLYCERIN (NITROSTAT) 0.4 MG SL tablet DISSOLVE 1 TABLET UNDER THE TONGUE EVERY 5 MINUTES AS  NEEDED FOR CHEST PAIN. MAX  OF 3 TABLETS IN  15 MINUTES. CALL 911 IF PAIN PERSISTS. 11/05/20   Nahser, Deloris Ping, MD  ondansetron (ZOFRAN) 4 MG tablet Take 4 mg by mouth every 8 (eight) hours as needed for nausea or vomiting.    [provider]  pantoprazole (PROTONIX) 40 MG tablet Take 40 mg by mouth in the morning.    [provider]  potassium chloride SA (KLOR-CON M) 20 MEQ tablet Take 1 tablet (20 mEq total) by mouth 2 (two) times daily. 05/23/21   Dellia Beckwith, MD  zolpidem (AMBIEN) 5 MG tablet TAKE 1 TABLET BY MOUTH ONCE DAILY AT BEDTIME AS NEEDED FOR SLEEP. 04/29/22   Dellia Beckwith, MD      Allergies     Pregabalin, Paclitaxel, and Sulfonamide derivatives    Review of Systems   Review of Systems  Respiratory:  Positive for shortness of breath.     Physical Exam Updated Vital Signs BP (!) 133/99   Pulse (!) 111   Temp (!) 100.9 F (38.3 C) (Rectal)   Resp (!) 28   Ht 4\' 11"  (1.499 m)   Wt 63.5 kg   SpO2 (!) 86%   BMI 28.28 kg/m  Physical Exam Vitals and nursing note reviewed.  Constitutional:      General: She is not in acute distress.    Appearance: She is well-developed. She is ill-appearing.  HENT:     Head: Normocephalic and atraumatic.  Eyes:     Pupils: Pupils are equal, round, and reactive to light.  Cardiovascular:     Rate and Rhythm: Regular rhythm. Tachycardia present.     Heart sounds: Normal heart sounds. No murmur heard.    No friction rub.  Pulmonary:     Effort: Pulmonary effort is normal. Tachypnea present.     Breath sounds: Wheezing present. No rales.  Abdominal:     General: Bowel sounds are normal. There is no distension.     Palpations: Abdomen is soft.     Tenderness: There is no abdominal tenderness. There is no guarding or rebound.  Musculoskeletal:        General: No tenderness. Normal range of motion.     Right lower leg: No edema.     Left lower leg: No edema.     Comments: No edema  Skin:    General: Skin is warm and dry.     Findings: No rash.  Neurological:     Mental Status: She is alert and oriented to person, place, and time. Mental status is at baseline.     Cranial Nerves: No cranial nerve deficit.  Psychiatric:        Behavior: Behavior normal.     ED Results / Procedures / Treatments   Labs (all labs ordered are listed, but only abnormal results are displayed) Labs Reviewed  CBC WITH DIFFERENTIAL/PLATELET - Abnormal; Notable for the following components:      Result Value   RBC 3.29 (*)    Hemoglobin 9.1 (*)    HCT 28.9 (*)    RDW 17.1 (*)    Platelets 144 (*)    All other components within normal limits   COMPREHENSIVE METABOLIC PANEL - Abnormal; Notable for the following components:   Potassium 2.6 (*)    CO2 21 (*)    Glucose, Bld 175 (*)    Total Protein 5.7 (*)    Albumin 3.0 (*)    GFR, Estimated 57 (*)    All other components within normal limits  BRAIN NATRIURETIC  PEPTIDE - Abnormal; Notable for the following components:   B Natriuretic Peptide 535.2 (*)    All other components within normal limits  LACTIC ACID, PLASMA - Abnormal; Notable for the following components:   Lactic Acid, Venous 2.2 (*)    All other components within normal limits  I-STAT VENOUS BLOOD GAS, ED - Abnormal; Notable for the following components:   pCO2, Ven 34.9 (*)    Acid-base deficit 3.0 (*)    Potassium 2.7 (*)    HCT 28.0 (*)    Hemoglobin 9.5 (*)    All other components within normal limits  TROPONIN I (HIGH SENSITIVITY) - Abnormal; Notable for the following components:   Troponin I (High Sensitivity) 487 (*)    All other components within normal limits  RESP PANEL BY RT-PCR (RSV, FLU A&B, COVID)  RVPGX2  CULTURE, BLOOD (ROUTINE X 2)  CULTURE, BLOOD (ROUTINE X 2)  PROTIME-INR  APTT  LACTIC ACID, PLASMA  URINALYSIS, W/ REFLEX TO CULTURE (INFECTION SUSPECTED)    EKG None ED ECG REPORT   Date: 05/16/2022  Rate: 152  Rhythm: atrial fibrillation  QRS Axis: normal  Intervals:  afib  ST/T Wave abnormalities: nonspecific ST changes  Conduction Disutrbances:none  Narrative Interpretation:   Old EKG Reviewed: none available  I have personally reviewed the EKG tracing and agree with the computerized printout as noted.  Radiology DG Chest Port 1 View  Result Date: 04/22/2022 CLINICAL DATA:  Cough and shortness of breath history of lung carcinoma EXAM: PORTABLE CHEST 1 VIEW COMPARISON:  12/19/2021 FINDINGS: Cardiac shadow is stable. Aortic calcifications are noted. Right chest wall port is seen. The patchy infiltrates seen on the prior exam are less well appreciated on today's study.  Persistent density in the right hilum is seen in part due to post radiation change. It would be difficult to exclude recurrent neoplasm in this area. Small effusions are noted likely chronic in nature. No acute bony abnormality is seen. IMPRESSION: Fullness in the right hilar region likely related to post radiation changes although the possibility of underlying soft tissue mass could not be totally excluded. Previously seen bilateral patchy infiltrates have improved in the interval from the prior exam. Electronically Signed   By: Alcide Clever M.D.   On: 04/22/2022 19:53    Procedures Procedures    Medications Ordered in ED Medications  lactated ringers infusion ( Intravenous New Bag/Given 04/30/2022 2003)  cefTRIAXone (ROCEPHIN) 2 g in sodium chloride 0.9 % 100 mL IVPB (0 g Intravenous Stopped 05/03/2022 2048)  azithromycin (ZITHROMAX) 500 mg in sodium chloride 0.9 % 250 mL IVPB (0 mg Intravenous Stopped 05/03/2022 2126)  albuterol (PROVENTIL) (2.5 MG/3ML) 0.083% nebulizer solution 10 mg (0 mg Nebulization Stopped 05/15/2022 2214)  potassium chloride 10 mEq in 100 mL IVPB (0 mEq Intravenous Hold 04/23/2022 2256)  diltiazem (CARDIZEM) 1 mg/mL load via infusion 10 mg (10 mg Intravenous Bolus from Bag 05/01/2022 2151)    And  diltiazem (CARDIZEM) 125 mg in dextrose 5% 125 mL (1 mg/mL) infusion (0 mg/hr Intravenous Paused 05/14/2022 2202)  dexmedetomidine (PRECEDEX) 400 MCG/100ML (4 mcg/mL) infusion (has no administration in time range)  methylPREDNISolone sodium succinate (SOLU-MEDROL) 125 mg/2 mL injection 125 mg (125 mg Intravenous Given 04/21/2022 1934)  magnesium sulfate IVPB 2 g 50 mL (0 g Intravenous Stopped 05/17/2022 2048)  acetaminophen (TYLENOL) tablet 1,000 mg (1,000 mg Oral Given 04/30/2022 2009)  ipratropium (ATROVENT) nebulizer solution 0.5 mg (0.5 mg Nebulization Given 05/01/2022 2131)  LORazepam (ATIVAN) injection 0.5 mg (  0.5 mg Intravenous Given 05/01/2022 2125)  LORazepam (ATIVAN) injection 0.5 mg (0.5 mg  Intravenous Given 05/05/2022 2235)  LORazepam (ATIVAN) injection 0.5 mg (0.5 mg Intravenous Given 04/23/2022 2246)  haloperidol lactate (HALDOL) injection 5 mg (5 mg Intravenous Given 04/26/2022 2252)    ED Course/ Medical Decision Making/ A&P                             Medical Decision Making Amount and/or Complexity of Data Reviewed External Data Reviewed: notes. Labs: ordered. Decision-making details documented in ED Course. Radiology: ordered and independent interpretation performed. Decision-making details documented in ED Course. ECG/medicine tests: ordered and independent interpretation performed. Decision-making details documented in ED Course.  Risk OTC drugs. Prescription drug management. Decision regarding hospitalization.   Pt with multiple medical problems and comorbidities and presenting today with a complaint that caries a high risk for morbidity and mortality.  Here today with complaint of cough and shortness of breath.  Patient found to have a temperature of 100.9, heart rate of 148 and elevated respiratory rate.  Patient is currently on her 4 L and satting at 100%.  Concern for pneumonia versus COPD exacerbation versus viral etiology.  Lower suspicion for CHF or PE.  Sepsis order set was initiated given patient's initial vital signs.  Patient had received 15 mg of albuterol and 1 Atrovent prior to arrival and reports improvement in her shortness of breath.  Will give Solu-Medrol and magnesium due to concern for COPD exacerbation.  Patient also covered with azithromycin and Rocephin per sepsis guidelines.  Patient was given IV fluid as lower suspicion for CHF as patient has no edema and no prior history.  11:06 PM I independently interpreted patient's EKG and labs.  Viral panel is negative, i-STAT with no evidence of hypercarbia normal pH, CBC with hemoglobin of 9 from baseline of 10 but normal white count and platelets, CMP with hypokalemia today of 2.6 most likely partially  related to albuterol treatments normal LFTs and creatinine, BNP elevated today at 530 from baseline of 300 and troponin elevated at 487 may be secondary to work of breathing and persistent tachycardia.  Lactic acid is elevated 2.2.  EKG today showing A-fib RVR with rates between 130-150.  Patient did reports she did not take her Cardizem or metoprolol today. I have independently visualized and interpreted pt's images today.  Without evidence of pneumonia. On repeat evaluation patient's work of breathing has worsened.  She is tripoding and wheezing diffusely.  A continuous neb was started however given patient's work of breathing she was started on BiPAP with improvement of her work of breathing and continuous neb was continued.  She also was given Cardizem for her A-fib RVR which is most likely causing her BNP to be up as her daughter reports at home she has been having intermittently fast heart rates.  Patient will need admission for COPD exacerbation and A-fib RVR.  Discussed this with the daughter and the patient and they are comfortable with this plan.  Consulted hospitalist for admission.  Potassium replaced IV.  11:06 PM After patient placed on BiPAP initially she was doing better, resting more comfortably work of breathing improving however she then became very agitated started reporting that she could not breathe and started thrashing in the bed pulling everything off.  She was given Ativan with little improvement and daughter is at bedside reporting she had said she would not want to be on a ventilator  again because the last time she was it was very hard for her.  Discussed with her daughter that we will try noninvasive means but it may not be successful and she reports that under no circumstance would her mom want to be intubated.  Patient was then given Haldol and did help her rest a little more comfortably where a BiPAP mask was ever did build to be put back on.  Discussed with critical care.   Patient was then started on Precedex however discussed with patient's daughter that this still may not be enough and patient may not improve without more invasive means.  She understands this.  Offered a chaplain for her daughter who she does not feel like she needs it right now.  CRITICAL CARE Performed by: Gean Laursen Total critical care time: 90 minutes Critical care time was exclusive of separately billable procedures and treating other patients. Critical care was necessary to treat or prevent imminent or life-threatening deterioration. Critical care was time spent personally by me on the following activities: development of treatment plan with patient and/or surrogate as well as nursing, discussions with consultants, evaluation of patient's response to treatment, examination of patient, obtaining history from patient or surrogate, ordering and performing treatments and interventions, ordering and review of laboratory studies, ordering and review of radiographic studies, pulse oximetry and re-evaluation of patient's condition.           Final Clinical Impression(s) / ED Diagnoses Final diagnoses:  COPD exacerbation (HCC)  Atrial fibrillation with RVR (HCC)  Hypokalemia  Acute on chronic respiratory failure with hypoxia [facility])    Rx / DC Orders ED Discharge Orders     None         Gwyneth Sprout, MD 05/10/2022 2306

## 2022-05-18 NOTE — ED Notes (Signed)
This RN assisting primary RN during intubation.  RT and provider at bedside as well.

## 2022-05-18 NOTE — Subjective & Objective (Signed)
Comes in from home complaining of shortness of breath patient has stage IV lung cancer currently not on chemotherapy.  Has been falling at home noticed significant wheezing patient was given albuterol and Atrovent with little bit of improvement but continues to be very short of breath at baseline.  She is on 4 L of oxygen but now wearing 8 blood pressure on arrival 130/70 heart rate in 130s respirations 20s to 30s History of strokes or intracranial hemorrhage reports her symptoms have been getting progressively worse she has little bit of sputum production no chest pain abdominal pain no nausea no vomiting patient has history of A-fib on Eliquis Emergency department found to be mildly febrile up to 100.9 question of COPD exacerbation patient was given a dose of Solu-Medrol started on Rocephin azithromycin patient required BiPAP noted to be in A-fib with RVR and started on Cardizem drip

## 2022-05-18 NOTE — ED Notes (Signed)
Date and time results received: 05/11/2022 2005  Test: K+ Critical Value: 2.7  Name of Provider Notified: Gwyneth Sprout Awaiting orders

## 2022-05-18 NOTE — Sepsis Progress Note (Signed)
Elink following code sepsis °

## 2022-05-18 NOTE — ED Notes (Signed)
Date and time results received: 05/10/2022 2109   Test: Lactic acid  Critical Value: 2.2  Name of Provider Notified: Gwyneth Sprout Awaiting orders

## 2022-05-18 NOTE — ED Notes (Signed)
Notified MD of pt vital signs changes. Per MD, pause cardizem infusion and bolus LR, recheck vitals q26m, continue to monitor.

## 2022-05-18 NOTE — H&P (Signed)
NAME:  [name], MRN:  [id number], DOB:  01/26/1940, LOS: 0 ADMISSION DATE:  05/17/2022,  REFERRING MD:  EDP, CHIEF COMPLAINT:  SOB   History of Present Illness:  30 year year old woman with advanced COPD, lung cancer in remission, on 4LPM chronically presenting with malaise, cough, congestion x 3 days.  Initially stabilized somewhat on BIPAP then developed what sounds like terminal delirium, hypoxemia so PCCM consulted.  Given haldol/ativan to help with BIPAP tolerance.  Daughter at bedside states mother would never want to go on breathing machine again after protracted course during previous admission.  Pertinent  Medical History  COPD on HOT Stage IIIb lung cancer in remission  Significant Hospital Events: Including procedures, antibiotic start and stop dates in addition to other pertinent events   4/28 admit  Interim History / Subjective:  Consult/admit  Objective   Blood pressure (!) 133/99, pulse (!) 111, temperature (!) 100.9 F (38.3 C), temperature source Rectal, resp. rate (!) 28, height 4\' 11"  (1.499 m), weight 63.5 kg, SpO2 (!) 86 %.    Vent Mode: PCV;BIPAP FiO2 (%):  [40 %-100 %] 100 % Set Rate:  [12 bmp] 12 bmp PEEP:  [8 cmH20] 8 cmH20   Intake/Output Summary (Last 24 hours) at 04/30/2022 2327 Last data filed at 05/03/2022 2252 Gross per 24 hour  Intake 248.76 ml  Output --  Net 248.76 ml    Filed Weights   05/01/2022 1901  Weight: 63.5 kg    Examination: General: ill appearing HENT: BIPAP in place with bad leak due to agitation and tachypnea Lungs: rhonci related to transmitted upper airway sounds Cardiovascular: tachy/regular, aflutter on monitor Abdomen: soft, +BS Extremities: trace edema Neuro: somnolent due to sedation, nonfocal exam per EDP Skin: age related changes, port accessed   VBG looks good CBC normal Severe hypokalemia noted  Resolved Hospital Problem list   N/A  Assessment & Plan:  Acute on chronic hypoxemic resp failuire-  CXR with lots of chronic changes, recurrence or progression of cancer  AFIB/rvr- on AC PTA, amio gtt ordered Hx Stage IIIb lung cancer- post chemoradiation and immunotherapy supposedly in remission Likely Type 2 MI Moderate protein calorie malnutrition POA AECOPD Prior DVT Prior tracheostomy GOC R/o CAP R/o viral PNA R/o volume overload Hypoxemic/septic delirium  Daughter at bedside states patient would not want ventilator.  Daughter on phone indicates that she would.  Patient in extremis and unable to answer questions.  - Intubate, vent bundle - PAD targeting RASS 0 to -1 - Replete K then consider diuresis - Amio gtt no bolus - PTA eliquis - Steroids, nebs - Check tracheal aspirate, RVP, CTA chest, Pct; ceftriaxone/azithro for now - Trops, limited echo, asa - PMT to help with complicated family dynamics, eldest daughter states patient only stated the wish to be DNI while depressed; youngest daughter thinks otherwise, full code until sorted out  Best Practice (right click and "Reselect all SmartList Selections" daily)   Diet/type: NPO DVT prophylaxis: DOAC GI prophylaxis: H2B Lines: [place]:  N/A Code Status:  full code Last date of multidisciplinary goals of care discussion [4/28, eldest daughter by phone youngest daughter by bedside]  Labs   CBC: Recent Labs  Lab 05/11/2022 1945 05/08/2022 2001  WBC 7.0  --   NEUTROABS 3.7  --   HGB 9.1* 9.5*  HCT 28.9* 28.0*  MCV 87.8  --   PLT 144*  --      Basic Metabolic Panel: Recent Labs  Lab 04/28/2022 1945  05/08/2022 2001  NA 142 144  K 2.6* 2.7*  CL 108  --   CO2 21*  --   GLUCOSE 175*  --   BUN 12  --   CREATININE 0.98  --   CALCIUM 9.1  --     GFR: Estimated Creatinine Clearance: 35.2 mL/min (by C-G formula based on SCr of 0.98 mg/dL). Recent Labs  Lab 04/29/2022 1945  WBC 7.0  LATICACIDVEN 2.2*     Liver Function Tests: Recent Labs  Lab 04/28/2022 1945  AST 27  ALT 18  ALKPHOS 75  BILITOT 0.6   PROT 5.7*  ALBUMIN 3.0*    No results for input(s): "LIPASE", "AMYLASE" in the last 168 hours. No results for input(s): "AMMONIA" in the last 168 hours.  ABG    Component Value Date/Time   PHART 7.301 (L) 08/08/2014 1214   PCO2ART 44.8 08/08/2014 1214   PO2ART 65.0 (L) 08/08/2014 1214   HCO3 21.5 05/01/2022 2001   TCO2 23 05/13/2022 2001   ACIDBASEDEF 3.0 (H) 05/09/2022 2001   O2SAT 77 05/01/2022 2001     Coagulation Profile: Recent Labs  Lab 05/17/2022 1945  INR 1.2     Cardiac Enzymes: No results for input(s): "CKTOTAL", "CKMB", "CKMBINDEX", "TROPONINI" in the last 168 hours.  HbA1C: Hgb A1c MFr Bld  Date/Time Value Ref Range Status  01/08/2019 10:33 PM 6.5 (H) 4.8 - 5.6 % Final    Comment:    (NOTE) Pre diabetes:          5.7%-6.4% Diabetes:              >6.4% Glycemic control for   <7.0% adults with diabetes   08/30/2014 07:08 AM 6.2 (H) 4.8 - 5.6 % Final    Comment:    (NOTE)         Pre-diabetes: 5.7 - 6.4         Diabetes: >6.4         Glycemic control for adults with diabetes: <7.0     CBG: No results for input(s): "GLUCAP" in the last 168 hours.  Review of Systems:   In extremis  Past Medical History:  She,  has a past medical history of Acute respiratory failure with hypoxia s/p tracheostomy (07/20/2013), Anemia, Arthritis, B12 deficiency (11/14/2020), Barrett's esophagus, CAD (coronary artery disease), Cancer (HCC), Carotid artery disease (HCC), Chronic back pain, Colonic obstruction due to suspected colitis; s/p colectomy/colostomy, Complication of anesthesia, COPD (chronic obstructive pulmonary disease) (HCC), Depression, Dizziness, DVT of axillary vein, acute right (HCC), GERD (gastroesophageal reflux disease), Heart murmur, History of bronchitis, History of colon polyps, History of hiatal hernia, History of kidney stones, HTN (hypertension), Hypertension, Intracranial hemorrhage (HCC), Joint pain, Neck pain, Other and unspecified hyperlipidemia,  Pneumonia, Protein-calorie malnutrition, severe w/ electrolyte imbalance, SBO (small bowel obstruction) (HCC), and Stroke (HCC) (early 80's).   Surgical History:   Past Surgical History:  Procedure Laterality Date   ABDOMINAL HYSTERECTOMY     APPENDECTOMY     BOWEL RESECTION N/A 08/01/2013   Procedure: SMALL BOWEL RESECTION;  Surgeon: Shelly Rubenstein, MD;  Location: [facility] OR;  Service: General;  Laterality: N/A;   BRONCHIAL BIOPSY  07/02/2020   Procedure: BRONCHIAL BIOPSIES;  Surgeon: Leslye Peer, MD;  Location: [facility] ENDOSCOPY;  Service: Pulmonary;;   BRONCHIAL BRUSHINGS  07/02/2020   Procedure: BRONCHIAL BRUSHINGS;  Surgeon: Leslye Peer, MD;  Location: [facility] ENDOSCOPY;  Service: Pulmonary;;   BRONCHIAL NEEDLE ASPIRATION BIOPSY  07/02/2020   Procedure: BRONCHIAL NEEDLE ASPIRATION  BIOPSIES;  Surgeon: Leslye Peer, MD;  Location: [facility] ENDOSCOPY;  Service: Pulmonary;;   BRONCHIAL WASHINGS  07/02/2020   Procedure: BRONCHIAL WASHINGS;  Surgeon: Leslye Peer, MD;  Location: [facility] ENDOSCOPY;  Service: Pulmonary;;   cataract surgery Bilateral    CHOLECYSTECTOMY     COLON RESECTION     COLON SURGERY     COLOSTOMY N/A 08/01/2013   Procedure: COLOSTOMY;  Surgeon: Shelly Rubenstein, MD;  Location: [facility] OR;  Service: General;  Laterality: N/A;   COLOSTOMY TAKEDOWN  01/24/2014   dr blackman   COLOSTOMY TAKEDOWN N/A 01/24/2014   Procedure: COLOSTOMY TAKEDOWN;  Surgeon: Abigail Miyamoto, MD;  Location: [facility] OR;  Service: General;  Laterality: N/A;   CORONARY ANGIOPLASTY WITH STENT PLACEMENT  12/20/2012   STENT TO OM         DR COOPER   ENDARTERECTOMY Right 08/08/2014   Procedure: RIGHT CAROTID ENDARTERECTOMY WITH HEMASHIELD PATCH ANGIOPLASTY;  Surgeon: Sherren Kerns, MD;  Location: [facility] OR;  Service: Vascular;  Laterality: Right;   FIDUCIAL MARKER PLACEMENT  07/02/2020   Procedure: FIDUCIAL MARKER PLACEMENT;  Surgeon: Leslye Peer, MD;  Location: [facility] ENDOSCOPY;  Service: Pulmonary;;   FOOT SURGERY Left     HAND SURGERY Bilateral    for ganglion cysts   HEMOSTASIS CONTROL  07/02/2020   Procedure: HEMOSTASIS CONTROL;  Surgeon: Leslye Peer, MD;  Location: [facility] ENDOSCOPY;  Service: Pulmonary;;  ice saline and epi   IR IMAGING GUIDED PORT INSERTION  08/23/2020   LAPAROTOMY N/A 08/01/2013   Procedure: EXPLORATORY LAPAROTOMY;  Surgeon: Shelly Rubenstein, MD;  Location: [facility] OR;  Service: General;  Laterality: N/A;   LEFT HEART CATH AND CORONARY ANGIOGRAPHY N/A 01/10/2019   Procedure: LEFT HEART CATH AND CORONARY ANGIOGRAPHY;  Surgeon: Lennette Bihari, MD;  Location: [facility] INVASIVE CV LAB;  Service: Cardiovascular;  Laterality: N/A;   LEFT HEART CATHETERIZATION WITH CORONARY ANGIOGRAM N/A 12/20/2012   Procedure: LEFT HEART CATHETERIZATION WITH CORONARY ANGIOGRAM;  Surgeon: Micheline Chapman, MD;  Location: [facility] CATH LAB;  Service: Cardiovascular;  Laterality: N/A;   PARTIAL COLECTOMY N/A 08/01/2013   Procedure: PARTIAL COLECTOMY;  Surgeon: Shelly Rubenstein, MD;  Location: [facility] OR;  Service: General;  Laterality: N/A;   ROTATOR CUFF REPAIR Left    TONSILLECTOMY     TRACHEOSTOMY     feinstein   VIDEO BRONCHOSCOPY WITH ENDOBRONCHIAL NAVIGATION N/A 07/02/2020   Procedure: VIDEO BRONCHOSCOPY WITH ENDOBRONCHIAL NAVIGATION;  Surgeon: Leslye Peer, MD;  Location: [facility] ENDOSCOPY;  Service: Pulmonary;  Laterality: N/A;     Social History:   reports that she quit smoking about 22 months ago. Her smoking use included cigarettes. She has a 60.00 pack-year smoking history. She has never used smokeless tobacco. She reports that she does not drink alcohol and does not use drugs.   Family History:  Her family history includes AAA (abdominal aortic aneurysm) in her father; Heart attack in her father; Heart disease in her father and another family member; Varicose Veins in her mother.   Allergies Allergies  Allergen Reactions   Pregabalin Swelling    Tongue swelling   Paclitaxel     Unknown reaction   Sulfonamide  Derivatives Swelling    Childhood reaction     Home Medications  Prior to Admission medications   Medication Sig Start Date End Date Taking? Authorizing Provider  apixaban (ELIQUIS) 5 MG TABS tablet Take 5 mg by mouth 2 (two) times daily.    [provider]  aspirin EC 81 MG tablet Take 2 tablets (162 mg total) by mouth in the morning. Okay to restart this medication on 07/04/2020 Patient not taking: Reported on 03/18/2022 07/02/20   Leslye Peer, MD  atorvastatin (LIPITOR) 80 MG tablet Take 80 mg by mouth every evening.     [provider]  budesonide (PULMICORT) 0.5 MG/2ML nebulizer solution Take by nebulization. 02/28/21   [provider]  buPROPion (WELLBUTRIN XL) 150 MG 24 hr tablet Take 1 tablet (150 mg total) by mouth daily. 03/02/15   Lars Masson, MD  diltiazem (CARDIZEM CD) 180 MG 24 hr capsule Take 180 mg by mouth daily.    [provider]  DULoxetine (CYMBALTA) 60 MG capsule Take 60 mg by mouth daily. 11/07/21   [provider]  ferrous sulfate 324 MG TBEC Take 324 mg by mouth daily with breakfast. Every other day    [provider]  fish oil-omega-3 fatty acids 1000 MG capsule Take 1,000 mg by mouth in the morning.    [provider]  folic acid (FOLVITE) 800 MCG tablet Take 800 mcg by mouth in the morning.    [provider]  furosemide (LASIX) 40 MG tablet Take 40 mg by mouth every morning. 01/16/22   [provider]  ipratropium-albuterol (DUONEB) 0.5-2.5 (3) MG/3ML SOLN USE 1 VIAL VIA NEBULIZER EVERY 6 HOURS AS NEEDED 04/02/21   Glenford Bayley, NP  metoprolol succinate (TOPROL-XL) 50 MG 24 hr tablet Take 75 mg by mouth daily. Take with or immediately following a meal.    [provider]  mirtazapine (REMERON) 45 MG tablet Take 45 mg by mouth at bedtime.  06/29/18   [provider]  Multiple Vitamins-Minerals (CENTRUM ADULTS PO) Take by mouth. Centrum silver    [provider]  nitroGLYCERIN (NITROSTAT) 0.4 MG SL tablet DISSOLVE 1 TABLET UNDER THE TONGUE EVERY 5 MINUTES AS  NEEDED FOR CHEST PAIN. MAX  OF 3 TABLETS IN 15 MINUTES. CALL 911 IF PAIN PERSISTS. 11/05/20   Nahser, Deloris Ping, MD  ondansetron (ZOFRAN) 4 MG tablet Take 4 mg by mouth every 8 (eight) hours as needed for nausea or vomiting.    [provider]  pantoprazole (PROTONIX) 40 MG tablet Take 40 mg by mouth in the morning.    [provider]  potassium chloride SA (KLOR-CON M) 20 MEQ tablet Take 1 tablet (20 mEq total) by mouth 2 (two) times daily. 05/23/21   Dellia Beckwith, MD  zolpidem (AMBIEN) 5 MG tablet TAKE 1 TABLET BY MOUTH ONCE DAILY AT BEDTIME AS NEEDED FOR SLEEP. 04/29/22   Dellia Beckwith, MD     Critical care time: 32 min independent of procedures

## 2022-05-18 NOTE — ED Notes (Signed)
Pt began to rapidly decompensate and was no longer tolerating bipap. MD aware and at bedside. Administered meds for agitation (see MAR) to help pt calm down. Attempted to use non-rebreather without success. After giving ativan x 2 doses and haldol, bipap was replaced.

## 2022-05-18 NOTE — ED Notes (Signed)
Llana Aliment (granddaughter) [phone] Carmel Sacramento (daughter) [phone]  Please call to discuss pt code status.

## 2022-05-18 NOTE — Sedation Documentation (Signed)
ETT placed with confirmed color change.  Breath sounds equal.

## 2022-05-18 NOTE — Procedures (Signed)
Intubation Procedure Note  [name]  [id number]  1940-12-28  Date:05/11/2022  Time:11:53 PM   Provider Performing:Emre Stock, Chales Abrahams    Procedure: Intubation (31500)  Indication(s) Respiratory Failure  Consent Unable to obtain consent due to emergent nature of procedure.   Anesthesia Per MD   Time Out Verified patient identification, verified procedure, site/side was marked, verified correct patient position, special equipment/implants available, medications/allergies/relevant history reviewed, required imaging and test results available.   Sterile Technique Usual hand hygeine, masks, and gloves were used   Procedure Description Patient positioned in bed supine.  Sedation given as noted above.  Patient was intubated with endotracheal tube using Glidescope.  View was Grade 1 full glottis .  Number of attempts was 1.  Colorimetric CO2 detector was consistent with tracheal placement.   Complications/Tolerance None; patient tolerated the procedure well. Chest X-ray is ordered to verify placement.   EBL Minimal   Specimen(s) None

## 2022-05-18 NOTE — ED Notes (Signed)
Date and time results received: 05/12/2022 2045  Test: Troponin, K+ Critical Value: Troponin 487, K+ 2.6  Name of Provider Notified: Dr. Anitra Lauth  Awaiting orders

## 2022-05-18 NOTE — ED Triage Notes (Signed)
Pt via [facility] from home for SOB and symptoms similar to previous PNA. Pt has stage IV lung cancer, currently not receiving treatment. Multiple falls at home. Wheezing rhonchi throughout bilateral lungs. Received 15mg  albuterol, 1mg  atrovent with some slight improvement. Pt is dyspneic at rest. Pt wears 4L oxygen at baseline, currently on 8L. Generalized malaise reported. A/O x 4.   BP 130/70 ETCO2 23-26 ST 130-160 RR 28-36 96% on neb

## 2022-05-19 ENCOUNTER — Inpatient Hospital Stay (HOSPITAL_COMMUNITY): Payer: 59

## 2022-05-19 ENCOUNTER — Other Ambulatory Visit: Payer: Self-pay

## 2022-05-19 DIAGNOSIS — J9621 Acute and chronic respiratory failure with hypoxia: Secondary | ICD-10-CM

## 2022-05-19 DIAGNOSIS — I2699 Other pulmonary embolism without acute cor pulmonale: Secondary | ICD-10-CM

## 2022-05-19 LAB — COOXEMETRY PANEL
Carboxyhemoglobin: 1.8 % — ABNORMAL HIGH (ref 0.5–1.5)
Methemoglobin: <0.7 % (ref 0.0–1.5)
O2 Saturation: 62.6 %
Total hemoglobin: 10.1 g/dL — ABNORMAL LOW (ref 12.0–16.0)

## 2022-05-19 LAB — CBC
HCT: 36 % (ref 36.0–46.0)
Hemoglobin: 10.9 g/dL — ABNORMAL LOW (ref 12.0–15.0)
MCH: 27.7 pg (ref 26.0–34.0)
MCHC: 30.3 g/dL (ref 30.0–36.0)
MCV: 91.6 fL (ref 80.0–100.0)
Platelets: 261 10*3/uL (ref 150–400)
RBC: 3.93 MIL/uL (ref 3.87–5.11)
RDW: 17.2 % — ABNORMAL HIGH (ref 11.5–15.5)
WBC: 15.7 10*3/uL — ABNORMAL HIGH (ref 4.0–10.5)
nRBC: 0.3 % — ABNORMAL HIGH (ref 0.0–0.2)

## 2022-05-19 LAB — PROCALCITONIN: Procalcitonin: <0.1 ng/mL

## 2022-05-19 LAB — LACTIC ACID, PLASMA
Lactic Acid, Venous: 4.8 mmol/L (ref 0.5–1.9)
Lactic Acid, Venous: 3.5 mmol/L (ref 0.5–1.9)

## 2022-05-19 LAB — POCT I-STAT 7, (LYTES, BLD GAS, ICA,H+H)
Acid-Base Excess: 0 mmol/L (ref 0.0–2.0)
Acid-base deficit: 13 mmol/L — ABNORMAL HIGH (ref 0.0–2.0)
Acid-base deficit: 12 mmol/L — ABNORMAL HIGH (ref 0.0–2.0)
Acid-base deficit: 11 mmol/L — ABNORMAL HIGH (ref 0.0–2.0)
Acid-base deficit: 3 mmol/L — ABNORMAL HIGH (ref 0.0–2.0)
Bicarbonate: 20 mmol/L (ref 20.0–28.0)
Bicarbonate: 19 mmol/L — ABNORMAL LOW (ref 20.0–28.0)
Bicarbonate: 20.2 mmol/L (ref 20.0–28.0)
Bicarbonate: 24.3 mmol/L (ref 20.0–28.0)
Bicarbonate: 28 mmol/L (ref 20.0–28.0)
Calcium, Ion: 1.26 mmol/L (ref 1.15–1.40)
Calcium, Ion: 1.26 mmol/L (ref 1.15–1.40)
Calcium, Ion: 1.27 mmol/L (ref 1.15–1.40)
Calcium, Ion: 1.2 mmol/L (ref 1.15–1.40)
Calcium, Ion: 1.12 mmol/L — ABNORMAL LOW (ref 1.15–1.40)
HCT: 34 % — ABNORMAL LOW (ref 36.0–46.0)
HCT: 35 % — ABNORMAL LOW (ref 36.0–46.0)
HCT: 35 % — ABNORMAL LOW (ref 36.0–46.0)
HCT: 32 % — ABNORMAL LOW (ref 36.0–46.0)
HCT: 30 % — ABNORMAL LOW (ref 36.0–46.0)
Hemoglobin: 11.6 g/dL — ABNORMAL LOW (ref 12.0–15.0)
Hemoglobin: 11.9 g/dL — ABNORMAL LOW (ref 12.0–15.0)
Hemoglobin: 11.9 g/dL — ABNORMAL LOW (ref 12.0–15.0)
Hemoglobin: 10.9 g/dL — ABNORMAL LOW (ref 12.0–15.0)
Hemoglobin: 10.2 g/dL — ABNORMAL LOW (ref 12.0–15.0)
O2 Saturation: 79 %
O2 Saturation: 88 %
O2 Saturation: 87 %
O2 Saturation: 88 %
O2 Saturation: 78 %
Patient temperature: 98.6
Patient temperature: 98.6
Patient temperature: 38.1
Potassium: 3.7 mmol/L (ref 3.5–5.1)
Potassium: 4.8 mmol/L (ref 3.5–5.1)
Potassium: 6 mmol/L — ABNORMAL HIGH (ref 3.5–5.1)
Potassium: 5.5 mmol/L — ABNORMAL HIGH (ref 3.5–5.1)
Potassium: 5 mmol/L (ref 3.5–5.1)
Sodium: 139 mmol/L (ref 135–145)
Sodium: 140 mmol/L (ref 135–145)
Sodium: 142 mmol/L (ref 135–145)
Sodium: 144 mmol/L (ref 135–145)
Sodium: 144 mmol/L (ref 135–145)
TCO2: 23 mmol/L (ref 22–32)
TCO2: 21 mmol/L — ABNORMAL LOW (ref 22–32)
TCO2: 22 mmol/L (ref 22–32)
TCO2: 26 mmol/L (ref 22–32)
TCO2: 30 mmol/L (ref 22–32)
pCO2 arterial: 94.9 mmHg (ref 32–48)
pCO2 arterial: 71.2 mmHg (ref 32–48)
pCO2 arterial: 74.2 mmHg (ref 32–48)
pCO2 arterial: 54.6 mmHg — ABNORMAL HIGH (ref 32–48)
pCO2 arterial: 63.5 mmHg — ABNORMAL HIGH (ref 32–48)
pH, Arterial: 6.933 — CL (ref 7.35–7.45)
pH, Arterial: 7.033 — CL (ref 7.35–7.45)
pH, Arterial: 7.043 — CL (ref 7.35–7.45)
pH, Arterial: 7.257 — ABNORMAL LOW (ref 7.35–7.45)
pH, Arterial: 7.258 — ABNORMAL LOW (ref 7.35–7.45)
pO2, Arterial: 72 mmHg — ABNORMAL LOW (ref 83–108)
pO2, Arterial: 81 mmHg — ABNORMAL LOW (ref 83–108)
pO2, Arterial: 77 mmHg — ABNORMAL LOW (ref 83–108)
pO2, Arterial: 63 mmHg — ABNORMAL LOW (ref 83–108)
pO2, Arterial: 53 mmHg — ABNORMAL LOW (ref 83–108)

## 2022-05-19 LAB — GLUCOSE, CAPILLARY
Glucose-Capillary: 411 mg/dL — ABNORMAL HIGH (ref 70–99)
Glucose-Capillary: 428 mg/dL — ABNORMAL HIGH (ref 70–99)
Glucose-Capillary: 359 mg/dL — ABNORMAL HIGH (ref 70–99)
Glucose-Capillary: 278 mg/dL — ABNORMAL HIGH (ref 70–99)
Glucose-Capillary: 156 mg/dL — ABNORMAL HIGH (ref 70–99)

## 2022-05-19 LAB — HEMOGLOBIN A1C
Hgb A1c MFr Bld: 6.3 % — ABNORMAL HIGH (ref 4.8–5.6)
Mean Plasma Glucose: 134.11 mg/dL

## 2022-05-19 LAB — RESPIRATORY PANEL BY PCR

## 2022-05-19 LAB — BASIC METABOLIC PANEL
Anion gap: 15 (ref 5–15)
BUN: 17 mg/dL (ref 8–23)
CO2: 21 mmol/L — ABNORMAL LOW (ref 22–32)
Calcium: 8.3 mg/dL — ABNORMAL LOW (ref 8.9–10.3)
Chloride: 106 mmol/L (ref 98–111)
Creatinine, Ser: 1.66 mg/dL — ABNORMAL HIGH (ref 0.44–1.00)
GFR, Estimated: 30 mL/min — ABNORMAL LOW (ref >60)
Glucose, Bld: 447 mg/dL — ABNORMAL HIGH (ref 70–99)
Potassium: 4.6 mmol/L (ref 3.5–5.1)
Sodium: 142 mmol/L (ref 135–145)

## 2022-05-19 LAB — URINALYSIS, W/ REFLEX TO CULTURE (INFECTION SUSPECTED)
Bilirubin Urine: NEGATIVE
Glucose, UA: 50 mg/dL — AB
Hgb urine dipstick: NEGATIVE
Ketones, ur: NEGATIVE mg/dL
Leukocytes,Ua: NEGATIVE
Nitrite: NEGATIVE
Protein, ur: 100 mg/dL — AB
Specific Gravity, Urine: 1.03 (ref 1.005–1.030)
pH: 5 (ref 5.0–8.0)

## 2022-05-19 LAB — TROPONIN I (HIGH SENSITIVITY)
Troponin I (High Sensitivity): 936 ng/L (ref <18)
Troponin I (High Sensitivity): 4066 ng/L (ref <18)

## 2022-05-19 LAB — PHOSPHORUS
Phosphorus: 8.3 mg/dL — ABNORMAL HIGH (ref 2.5–4.6)
Phosphorus: 5.6 mg/dL — ABNORMAL HIGH (ref 2.5–4.6)

## 2022-05-19 LAB — CULTURE, RESPIRATORY W GRAM STAIN

## 2022-05-19 LAB — MAGNESIUM
Magnesium: 2.4 mg/dL (ref 1.7–2.4)
Magnesium: 1.8 mg/dL (ref 1.7–2.4)

## 2022-05-19 LAB — ECHOCARDIOGRAM LIMITED
Height: 59 in
S' Lateral: 3.25 cm
Weight: 2659.63 oz

## 2022-05-19 LAB — CULTURE, BLOOD (ROUTINE X 2)

## 2022-05-19 LAB — BRAIN NATRIURETIC PEPTIDE: B Natriuretic Peptide: 1042.2 pg/mL — ABNORMAL HIGH (ref 0.0–100.0)

## 2022-05-19 LAB — MRSA NEXT GEN BY PCR, NASAL: MRSA by PCR Next Gen: NOT DETECTED

## 2022-05-19 MED ORDER — FUROSEMIDE 10 MG/ML IJ SOLN
40.0000 mg | Freq: Once | INTRAMUSCULAR | Status: AC
  Administered 2022-05-19: 40 mg via INTRAVENOUS
  Filled 2022-05-19: qty 4

## 2022-05-19 MED ORDER — NOREPINEPHRINE 4 MG/250ML-% IV SOLN
INTRAVENOUS | Status: AC
  Filled 2022-05-19: qty 250

## 2022-05-19 MED ORDER — SODIUM BICARBONATE 8.4 % IV SOLN
100.0000 meq | Freq: Once | INTRAVENOUS | Status: AC
  Administered 2022-05-19: 100 meq via INTRAVENOUS

## 2022-05-19 MED ORDER — ONDANSETRON 4 MG PO TBDP: 4.0000 mg | ORAL_TABLET | Freq: Four times a day (QID) | ORAL | Status: DC | PRN

## 2022-05-19 MED ORDER — MIDAZOLAM BOLUS VIA INFUSION (WITHDRAWAL LIFE SUSTAINING TX): 2.0000 mg | INTRAVENOUS | Status: DC | PRN

## 2022-05-19 MED ORDER — MIDAZOLAM BOLUS VIA INFUSION: 0.0000 mg | INTRAVENOUS | Status: DC | PRN

## 2022-05-19 MED ORDER — SODIUM CHLORIDE 0.9 % IV SOLN: INTRAVENOUS | Status: DC

## 2022-05-19 MED ORDER — IOHEXOL 350 MG/ML SOLN
75.0000 mL | Freq: Once | INTRAVENOUS | Status: AC | PRN
  Administered 2022-05-19: 75 mL via INTRAVENOUS

## 2022-05-19 MED ORDER — MIDAZOLAM-SODIUM CHLORIDE 100-0.9 MG/100ML-% IV SOLN: 0.0000 mg/h | INTRAVENOUS | Status: DC

## 2022-05-19 MED ORDER — FUROSEMIDE 10 MG/ML IJ SOLN
80.0000 mg | Freq: Two times a day (BID) | INTRAMUSCULAR | Status: DC
  Administered 2022-05-19: 80 mg via INTRAVENOUS
  Filled 2022-05-19: qty 8

## 2022-05-19 MED ORDER — GLYCOPYRROLATE 0.2 MG/ML IJ SOLN: 0.2000 mg | INTRAMUSCULAR | Status: DC | PRN

## 2022-05-19 MED ORDER — ACETAMINOPHEN 650 MG RE SUPP: 650.0000 mg | Freq: Four times a day (QID) | RECTAL | Status: DC | PRN

## 2022-05-19 MED ORDER — INSULIN ASPART 100 UNIT/ML IJ SOLN: 0.0000 [IU] | INTRAMUSCULAR | Status: DC

## 2022-05-19 MED ORDER — FENTANYL BOLUS VIA INFUSION: 100.0000 ug | INTRAVENOUS | Status: DC | PRN

## 2022-05-19 MED ORDER — STERILE WATER FOR INJECTION IV SOLN
INTRAVENOUS | Status: DC
  Filled 2022-05-19 (×2): qty 1000

## 2022-05-19 MED ORDER — POLYVINYL ALCOHOL 1.4 % OP SOLN: 1.0000 [drp] | Freq: Four times a day (QID) | OPHTHALMIC | Status: DC | PRN

## 2022-05-19 MED ORDER — FAMOTIDINE 20 MG PO TABS: 20.0000 mg | ORAL_TABLET | Freq: Every day | ORAL | Status: DC

## 2022-05-19 MED ORDER — SODIUM CHLORIDE 0.9% FLUSH: 10.0000 mL | INTRAVENOUS | Status: DC | PRN

## 2022-05-19 MED ORDER — INSULIN ASPART 100 UNIT/ML IJ SOLN
0.0000 [IU] | INTRAMUSCULAR | Status: DC
  Administered 2022-05-19: 3 [IU] via SUBCUTANEOUS
  Administered 2022-05-19: 15 [IU] via SUBCUTANEOUS
  Administered 2022-05-19: 8 [IU] via SUBCUTANEOUS
  Administered 2022-05-19: 3 [IU] via SUBCUTANEOUS

## 2022-05-19 MED ORDER — VITAL 1.5 CAL PO LIQD
1000.0000 mL | ORAL | Status: DC
  Administered 2022-05-19: 1000 mL

## 2022-05-19 MED ORDER — ACETAMINOPHEN 325 MG PO TABS: 650.0000 mg | ORAL_TABLET | Freq: Four times a day (QID) | ORAL | Status: DC | PRN

## 2022-05-19 MED ORDER — MIDAZOLAM-SODIUM CHLORIDE 100-0.9 MG/100ML-% IV SOLN
0.0000 mg/h | INTRAVENOUS | Status: DC
  Administered 2022-05-19: 2 mg/h via INTRAVENOUS
  Filled 2022-05-19: qty 100

## 2022-05-19 MED ORDER — NOREPINEPHRINE 4 MG/250ML-% IV SOLN
0.0000 ug/min | INTRAVENOUS | Status: DC
  Administered 2022-05-19: 10 ug/min via INTRAVENOUS

## 2022-05-19 MED ORDER — PROSOURCE TF20 ENFIT COMPATIBL EN LIQD
60.0000 mL | Freq: Every day | ENTERAL | Status: DC
  Administered 2022-05-19: 60 mL
  Filled 2022-05-19: qty 60

## 2022-05-19 MED ORDER — VITAL HIGH PROTEIN PO LIQD: 1000.0000 mL | ORAL | Status: DC

## 2022-05-19 MED ORDER — SODIUM BICARBONATE 8.4 % IV SOLN
100.0000 meq | Freq: Once | INTRAVENOUS | Status: AC
  Administered 2022-05-19: 100 meq via INTRAVENOUS
  Filled 2022-05-19: qty 100

## 2022-05-19 MED ORDER — ASPIRIN 325 MG PO TABS
325.0000 mg | ORAL_TABLET | Freq: Once | ORAL | Status: AC
  Administered 2022-05-19: 325 mg
  Filled 2022-05-19: qty 1

## 2022-05-19 MED ORDER — METHYLPREDNISOLONE SODIUM SUCC 125 MG IJ SOLR: 80.0000 mg | INTRAMUSCULAR | Status: DC

## 2022-05-19 MED ORDER — HEPARIN (PORCINE) 25000 UT/250ML-% IV SOLN
1200.0000 [IU]/h | INTRAVENOUS | Status: DC
  Administered 2022-05-19: 1200 [IU]/h via INTRAVENOUS
  Filled 2022-05-19: qty 250

## 2022-05-19 MED ORDER — ACETAMINOPHEN 160 MG/5ML PO SOLN
650.0000 mg | Freq: Four times a day (QID) | ORAL | Status: DC | PRN
  Administered 2022-05-19: 650 mg

## 2022-05-19 MED ORDER — ACETAMINOPHEN 160 MG/5ML PO SOLN
ORAL | Status: AC
  Filled 2022-05-19: qty 20.3

## 2022-05-19 MED ORDER — ASPIRIN 81 MG PO CHEW: 81.0000 mg | CHEWABLE_TABLET | Freq: Every day | ORAL | Status: DC

## 2022-05-19 MED ORDER — FENTANYL 2500MCG IN NS 250ML (10MCG/ML) PREMIX INFUSION: 0.0000 ug/h | INTRAVENOUS | Status: DC

## 2022-05-19 MED ORDER — DIPHENHYDRAMINE HCL 50 MG/ML IJ SOLN: 25.0000 mg | INTRAMUSCULAR | Status: DC | PRN

## 2022-05-19 MED ORDER — CHLORHEXIDINE GLUCONATE CLOTH 2 % EX PADS
6.0000 | MEDICATED_PAD | Freq: Every day | CUTANEOUS | Status: DC
  Administered 2022-05-19: 6 via TOPICAL

## 2022-05-19 MED ORDER — SODIUM CHLORIDE 0.9% FLUSH
10.0000 mL | Freq: Two times a day (BID) | INTRAVENOUS | Status: DC
  Administered 2022-05-19: 10 mL

## 2022-05-19 MED ORDER — INSULIN ASPART 100 UNIT/ML IJ SOLN
10.0000 [IU] | Freq: Once | INTRAMUSCULAR | Status: AC
  Administered 2022-05-19: 10 [IU] via SUBCUTANEOUS

## 2022-05-19 MED ORDER — ONDANSETRON HCL 4 MG/2ML IJ SOLN: 4.0000 mg | Freq: Four times a day (QID) | INTRAMUSCULAR | Status: DC | PRN

## 2022-05-19 MED ORDER — FENTANYL 2500MCG IN NS 250ML (10MCG/ML) PREMIX INFUSION
0.0000 ug/h | INTRAVENOUS | Status: DC
  Administered 2022-05-19: 50 ug/h via INTRAVENOUS
  Filled 2022-05-19: qty 250

## 2022-05-19 MED ORDER — GLYCOPYRROLATE 1 MG PO TABS: 1.0000 mg | ORAL_TABLET | ORAL | Status: DC | PRN

## 2022-05-19 MED ORDER — MIDAZOLAM HCL 2 MG/2ML IJ SOLN: 1.0000 mg | INTRAMUSCULAR | Status: DC | PRN

## 2022-05-19 MED ORDER — DOCUSATE SODIUM 50 MG/5ML PO LIQD: 100.0000 mg | Freq: Two times a day (BID) | ORAL | Status: DC

## 2022-05-19 MED ORDER — POLYETHYLENE GLYCOL 3350 17 G PO PACK
17.0000 g | PACK | Freq: Every day | ORAL | Status: DC
  Administered 2022-05-19: 17 g
  Filled 2022-05-19: qty 1

## 2022-05-19 MED ORDER — SODIUM BICARBONATE 8.4 % IV SOLN
50.0000 meq | Freq: Once | INTRAVENOUS | Status: AC
  Administered 2022-05-19: 50 meq via INTRAVENOUS
  Filled 2022-05-19: qty 50

## 2022-05-19 MED ORDER — INSULIN GLARGINE-YFGN 100 UNIT/ML ~~LOC~~ SOLN
10.0000 [IU] | Freq: Every day | SUBCUTANEOUS | Status: DC
  Administered 2022-05-19: 10 [IU] via SUBCUTANEOUS
  Filled 2022-05-19 (×2): qty 0.1

## 2022-05-20 LAB — CULTURE, RESPIRATORY W GRAM STAIN

## 2022-05-20 LAB — GLUCOSE, CAPILLARY: Glucose-Capillary: 149 mg/dL — ABNORMAL HIGH (ref 70–99)

## 2022-05-20 LAB — CULTURE, BLOOD (ROUTINE X 2): Culture: NO GROWTH

## 2022-05-21 LAB — CULTURE, BLOOD (ROUTINE X 2): Culture: NO GROWTH

## 2022-05-21 LAB — CULTURE, RESPIRATORY W GRAM STAIN: Culture: NORMAL

## 2022-05-21 NOTE — TOC Initial Note (Signed)
Transition of Care [facility]) - Initial/Assessment Note    Patient Details  Name: [name] MRN: [id number] Date of Birth: 21-Aug-1940  Transition of Care [facility]) CM/SW Contact:    Elliot Cousin, RN Phone Number: [phone] 05/01/2022, 4:19 PM  Clinical Narrative:    HF TOC CM contacted dtr, left message for return call.                Expected Discharge Plan: Skilled Nursing Facility Barriers to Discharge: Continued Medical Work up   Patient Goals and CMS Choice    Expected Discharge Plan and Services    Prior Living Arrangements/Services    Activities of Daily Living      Permission Sought/Granted      Emotional Assessment   Attitude/Demeanor/Rapport: Intubated (Following Commands or Not Following Commands)       Psych Involvement: No (comment)  Admission diagnosis:  Respiratory failure (HCC) [J96.90] Hypokalemia [E87.6] COPD exacerbation (HCC) [J44.1] Atrial fibrillation with RVR (HCC) [I48.91] Acute on chronic respiratory failure with hypoxia (HCC) [J96.21] Patient Active Problem List   Diagnosis Date Noted   Respiratory failure (HCC) 05/17/2022   Deficiency anemia 03/25/2021    Class: Chronic   Pulmonary infiltrate 03/15/2021   B12 deficiency 11/14/2020   Port-A-Cath in place 11/13/2020   SVT (supraventricular tachycardia) 08/16/2020   Occult GI bleeding 08/16/2020   Malignant neoplasm of bronchus of right lower lobe (HCC) 07/10/2020   Encounter for antineoplastic chemotherapy 07/10/2020   Pulmonary nodule 1 cm or greater in diameter 06/21/2020   COPD (chronic obstructive pulmonary disease) (HCC) 06/21/2020   Degenerative disc disease, lumbar 06/19/2020   Degenerative disc disease, cervical 06/19/2020   Type 2 diabetes mellitus with complication, without long-term current use of insulin (HCC) 01/11/2019   NSTEMI (non-ST elevated myocardial infarction) (HCC) 01/08/2019   Osteopenia after menopause 03/17/2016    Class: Chronic    Carotid stenosis 10/23/2014   S/P carotid endarterectomy 10/23/2014   Cerebrovascular accident (CVA) due to embolism of right middle cerebral artery (HCC) 10/23/2014   Subacromial impingement of left shoulder 10/02/2014   Adhesive capsulitis of left shoulder 10/02/2014   Other impaction of intestine (HCC)    Left hemiparesis (HCC) 08/13/2014   Acute right arterial ischemic stroke, middle cerebral artery (MCA) (HCC) 08/11/2014   Carotid artery stenosis, symptomatic 08/08/2014   Carotid disease, bilateral (HCC) 06/02/2014   Coronary artery disease due to lipid rich plaque 06/02/2014   Essential hypertension 06/02/2014   Hyperlipidemia 06/02/2014   Chronic respiratory failure with hypoxia (HCC)    S/P colostomy takedown 01/24/2014   Acute on chronic diastolic CHF (congestive heart failure) (HCC) 11/10/2013   Atypical chest pain 10/20/2013   Malnutrition of moderate degree (HCC) 09/18/2013   SBO (small bowel obstruction) (HCC) 09/17/2013   H/O tracheostomy 09/17/2013   Acute respiratory failure with hypoxia s/p tracheostomy 08/19/2013   Physical deconditioning 08/19/2013   HTN (hypertension) 08/19/2013   Dehydration with hypernatremia 08/19/2013   DVT of axillary vein, acute right (HCC) 08/19/2013   Anemia 08/19/2013   Anasarca 08/14/2013   Hypokalemia 07/31/2013   Hypomagnesemia 07/31/2013   Protein-calorie malnutrition, severe w/ electrolyte imbalance 07/31/2013   Colonic obstruction due to suspected colitis; s/p colectomy/colostomy 07/29/2013   CAD (coronary artery disease)    Depression    Barrett's esophagus    Arthritis    Other and unspecified angina pectoris 12/20/2012   Chest pain 12/14/2012   TOBACCO ABUSE 11/08/2009   Hyperlipidemia, mixed 04/17/2008   PCP:  Waynetta Sandy,  Amy A, NP Pharmacy:   [facility]) - [place], [place] - [address] [facility] [address] [place] [address] [place] [address] Phone: [phone] Fax: [phone]  [facility] - [place], [place] - [address] [place] [address] Phone: [phone] Fax: [phone]     Social Determinants of Health (SDOH) Social History: SDOH Screenings   Tobacco Use: Medium Risk (03/18/2022)   SDOH Interventions:     Readmission Risk Interventions     No data to display

## 2022-05-21 NOTE — Progress Notes (Signed)
Arterial Catheter Insertion Procedure Note  [name]  [id number]  05-01-1940  Date:05/08/2022  Time:3:55 PM    Provider Performing: Lynnell Catalan    Procedure: Insertion of Arterial Line (78469) with US guidance (62952)   Indication(s) Blood pressure monitoring and/or need for frequent ABGs  Consent Unable to obtain consent due to emergent nature of procedure.  Anesthesia 1% local   Time Out Verified patient identification, verified procedure, site/side was marked, verified correct patient position, special equipment/implants available, medications/allergies/relevant history reviewed, required imaging and test results available.   Sterile Technique Maximal sterile technique including full sterile barrier drape, hand hygiene, sterile gown, sterile gloves, mask, hair covering, sterile ultrasound probe cover (if used).   Procedure Description Area of catheter insertion was cleaned with chlorhexidine and draped in sterile fashion. With real-time ultrasound guidance an arterial catheter was placed into the right femoral artery.  Appropriate arterial tracings confirmed on monitor.     Complications/Tolerance None; patient tolerated the procedure well.   EBL Minimal   Specimen(s) None  Lynnell Catalan, MD [facility] ICU Physician [facility]  Pager: [phone] Or Epic Secure Chat After hours: [phone].  05/01/2022, 3:56 PM

## 2022-05-21 NOTE — Progress Notes (Addendum)
eLink Physician-Brief Progress Note Patient Name: [name] DOB: January 18, 1941 MRN: [id number]   Date of Service  04/29/2022  HPI/Events of Note  82/F admitted for respiratory failure secondary to combination of COPD exacerbation, pulmonary edema. She had failed trial of BIPAP, was ultimately intubated and admitted to the ICU.   eICU Interventions  -s/p intubation. Will maintain on low tidal volume ventilation strategy.  - With severe respiratory acidosis on last ABG. RR increased to 32. Will continue to monitor serial ABG, make further adjustments as warranted.  - Titrate FiO2 and PEEP to target SpO2 around 90% - Continue bronchodilators, systemic steroids - Given lasix to address pulmonary edema     - Pt without recorded UO for the past 6 hours. Will have foley catheter placed to facilitate I/O monitoring.      - On K repletion. Will continue to monitor and give additional Kcl as needed.          Stephfon Bovey M DELA CRUZ 04/21/2022, 1:04 AM  2:12 AM Notified of elevated glucose, pt without due insulin at this time.  Will give one time order for insulin aspart 10 units.  Increased subsequent sliding scale to medium dose q4h Will monitor glucose trends and make further adjustments as needed

## 2022-05-21 NOTE — Progress Notes (Signed)
ANTICOAGULATION CONSULT NOTE  Pharmacy Consult for heparin Indication: chest pain/ACS  Allergies  Allergen Reactions   Pregabalin Swelling    Tongue swelling   Paclitaxel     Unknown reaction   Sulfonamide Derivatives Swelling    Childhood reaction    Patient Measurements: Height: 4\' 11"  (149.9 cm) Weight: 75.4 kg (166 lb 3.6 oz) IBW/kg (Calculated) : 43.2 Heparin Dosing Weight: 75kg  Vital Signs: Temp: 99.5 F (37.5 C) (04/29 1300) Temp Source: Bladder (04/29 1200) BP: 114/60 (04/29 1300) Pulse Rate: 123 (04/29 1300)  Labs: Recent Labs    05/20/2022 1945 05/10/2022 2001 05/13/2022 0146 05/04/2022 0317 04/24/2022 0404 05/11/2022 0745  HGB 9.1*   < >  --  11.9* 10.9* 11.9*  HCT 28.9*   < >  --  35.0* 36.0 35.0*  PLT 144*  --   --   --  261  --   APTT 29  --   --   --   --   --   LABPROT 14.9  --   --   --   --   --   INR 1.2  --   --   --   --   --   CREATININE 0.98  --   --   --  1.66*  --   TROPONINIHS 487*  --  936*  --  4,066*  --    < > = values in this interval not displayed.    Estimated Creatinine Clearance: 22.7 mL/min (A) (by C-G formula based on SCr of 1.66 mg/dL (H)).   Medical History: Past Medical History:  Diagnosis Date   Acute respiratory failure with hypoxia s/p tracheostomy 07/20/2013   Anemia    a. 7-08/2013 felt due to recent critical illness/chronic disease.   Arthritis    handds, knees & back    B12 deficiency 11/14/2020   Barrett's esophagus    CAD (coronary artery disease)    a. Mod dz 2010 initially mgd medically. b. 12/2012 - angina s/p PTCA/DES to mid-circumflex, PTCA/DES to first OM.    Cancer [facility])    Carotid artery disease (HCC)    a. 60-70% bilat ICA stenosis by dopplers 05/2012.   Chronic back pain    deteriorating;DDD   Colonic obstruction due to suspected colitis; s/p colectomy/colostomy    a. See SBO.   Complication of anesthesia    hard to wake up from anesthesia    COPD (chronic obstructive pulmonary disease) (HCC)     Depression    takes Remeron and Zoloft daily   Dizziness    was on Bentyl which caused this-took off of it and no problems since   DVT of axillary vein, acute right (HCC)    a. Dx 07/2013 felt due to R IJ central line that had been inserted 7/14. Not on anticoag due to GIB issues and small cerebral bleed.   GERD (gastroesophageal reflux disease)    Heart murmur    History of bronchitis    > 67yrs ago    History of colon polyps    History of hiatal hernia    History of kidney stones    was told it was stable but doesn't know for sure that she ever passed it   HTN (hypertension)    Hypertension    takes Metoprolol daily   Intracranial hemorrhage (HCC)    a. 08/11/13 - per DC summary, tiny SAH vs SDH done for altered mentation, anticoag stopped including aspirin.   Joint pain  Neck pain    DDD   Other and unspecified hyperlipidemia    takes Lipitor daily   Pneumonia    hx of->15 yrs ago   Protein-calorie malnutrition, severe w/ electrolyte imbalance    a. Severe hypoalbuminemia leading to 3rd spacing including anasarca and pulm edema 07/2013.   SBO (small bowel obstruction) (HCC)    a. Lysis of adhesions and ovarian cystectomy 04/2013 for sBO. b. Admitted 07/2013 for colonic obstruction due to suspected colitis, s/p partial colectomy/colostomy 08/01/13. Admission complicated by anasarca, acute resp failure requiring tracheostomy, decannulated 08/25/13. c. Recurrent SBO8/2015, NGT placed for decompression.    Stroke [facility]) early 80's   right sided weakness--TIA     Assessment: 22 yoF with hx CAD admitted with respiratory failure. Pt on apixaban PTA for hx DVT. ECHO with WMAs and troponins elevated so transitioning to heparin as pt may need ischemic evaluation. Last dose of apixaban was 1030 this am so will need to check aPTTs.  Goal of Therapy:  Heparin level 0.3-0.7 units/ml aPTT 66-102 seconds Monitor platelets by anticoagulation protocol: Yes   Plan:  Hold apixaban Heparin 1200  units/h starting at 2230 Check aPTT and heparin level with am labs   Fredonia Highland, PharmD, BCPS, [facility] Clinical Pharmacist [phone] Please check AMION for all [facility] Pharmacy numbers 05/11/2022

## 2022-05-21 NOTE — Progress Notes (Signed)
Post intubation film looks like flash edema; should improve with ETT; will give a dose of lasix, need to keep repleting K.

## 2022-05-21 NOTE — Progress Notes (Signed)
eLink Physician-Brief Progress Note Patient Name: [name] DOB: [birth year] MRN: [id number]   Date of Service  04/24/2022  HPI/Events of Note  Family has decided to transition patient to comfort care.   eICU Interventions  Comfort measures order set placed.  Clarified sedation orders - OK to maintain on fentnayl, precedex, and versed for pain control/anxiolysis.         [name] M DELA CRUZ 04/27/2022, 9:45 PM

## 2022-05-21 NOTE — Progress Notes (Deleted)
Endotracheal tube advanced 2cm and secured at 24cm at the lip

## 2022-05-21 NOTE — Progress Notes (Signed)
Gave 3 rings to patients daughter Eunice Blase.

## 2022-05-21 NOTE — Progress Notes (Signed)
NAME:  [name], MRN:  [id number], DOB:  04/04/1940, LOS: 1 ADMISSION DATE:  05/10/2022, CONSULTATION DATE:  4/28 REFERRING MD:  EDP, CHIEF COMPLAINT:  Dyspnea  History of Present Illness:  71 year year old woman with advanced COPD, lung cancer in remission, on 4LPM chronically, CAD s/p PCI to Cx 2014, NSTEMI 2020, HTN, HLD, HFpEF, PAD, CVA, Hx of DVT on eliquis, carotid artery disease s/p right carotid endarterectomy 2016 presenting with malaise, cough, congestion x 3 days. Initially stabilized somewhat on BIPAP then developed what sounds like terminal delirium, hypoxemia so PCCM consulted. Given haldol/ativan to help with BIPAP tolerance. Unclear code status with discussions with patients daughters, patient ultimately intubated and moved to ICU  Pertinent  Medical History  Advanced COPD Chronic hypoxic respiratory failure Stage IIIb lung cancer in remission CAD s/p PCI 2014, NSTEMI 2020 HTN HLD HFpEF PAD CVA Carotid artery disease s/p right carotid endarterectomy Hx of DVT on chronic anticoagulation  Significant Hospital Events: Including procedures, antibiotic start and stop dates in addition to other pertinent events   4/29 Intubated and transferred to ICU 4/29 started on azithromycin, ceftriaxone  Interim History / Subjective:   Increased respiratory rate and breathing over ventillator. Minimal urinary output.   Objective   Blood pressure (!) 116/53, pulse (!) 106, temperature 98.2 F (36.8 C), resp. rate (!) 34, height 4\' 11"  (1.499 m), weight 75.4 kg, SpO2 96 %.    Vent Mode: PRVC FiO2 (%):  [40 %-100 %] 100 % Set Rate:  [12 bmp-32 bmp] 32 bmp Vt Set:  [340 mL-350 mL] 340 mL PEEP:  [8 cmH20-12 cmH20] 12 cmH20 Plateau Pressure:  [16 cmH20-28 cmH20] 28 cmH20   Intake/Output Summary (Last 24 hours) at 05/02/2022 4098 Last data filed at 04/28/2022 0900 Gross per 24 hour  Intake 1732.5 ml  Output 138 ml  Net 1594.5 ml   Filed Weights   05/16/2022 1901  04/25/2022 0128  Weight: 63.5 kg 75.4 kg    Examination: General: ill appearing female HENT: ETT in place Lungs: crackles at lung bases bilaterally Cardiovascular: tachycardic Abdomen: soft, non-distended Extremities: warm, trace lower extremity edema Neuro: sedated, not following commands  Pertinent labs BNP - 535>1042 Troponin 936>4066 pH 7.04, pCO2 74, PO2 77 Procal <.10  Resolved Hospital Problem list   N/a  Assessment & Plan:   Acute on chronic hypercarbic and hypoxic respiratory failure AGMA Advanced COPD Hx of Stage IIIIb lung cancer s/p chemoradiation/immunotherapy in remission Acute on chronic HFpEF exacerbation Possible aspiration Respiratory failure likely multifactorial, initially thought to be infectious but with negative pro calcitonin and negative infectious workup thus far, less likely. Possible aspiration after intubation so will continue abx to cover for this as well as for COPD exacerbation.   This morning BNP doubled to over 1000 and troponin increased to 4000. Repeat EKG this morning with negative QRS complexes in lateral leads. Echocardiogram with worsening EF 30-35%, regional wall motion abnormalities. Mildly reduced RV. Will start heparin for NSTEMI w/ history of CAD. Will need to sort out if ischemic or tachycardia induced cardiomyopathy once stabilizes from respiratory perspective.   -IV heparin and aspirin daily -PICC placement to measure CVP and check coox -IV lasix 80 mg BID, titrate based on above -consult cardiology once stabilizes  -VAP protocol, VENT bundle -try SBT tomorrow -PAD with RASS of 0,-1 -COPD exacerbation tx; methylpred 80 mg BID day 2/5, brovana BID, yupelri, pulmicort BID -Aspiration tx with ceftriaxone day 2/5 -trend leukocytosis, likely due to steroids  Afib with RVR Hx of SVT No prior documented history of atrial fibrillation, unclear if related HF exacerbation or possible ischemic event.  -continue amiodarone -heparin  ggt  AKI Cr of 1.66, baseline appears to be under 1. UA without hematuria, though RBC present. Proteinuria. No casts. Possibly congestive secondary to HF exacerbation. Will monitor daily with diuretics -if no improvement will order renal US -diurese as per above, replete electrolytes as needed  Hx of DVT right axillary vein Not chronically anticoagulated per chart review due hx of GI bleeding. Unclear if this was provoked with history of lung cancer. -heparin ggt  Hx of Barrett esophagus -monitor for GI bleed while on heparin -famotidine BID  Goals of care Need ongoing goals of care conversations with family. Two daughters note difference in patients wishes in regards to code status. Will keep full code until further clarity. No documentation in epic seen. Prior admissions all full code status.  -appreciate palliative care assistance   Best Practice (right click and "Reselect all SmartList Selections" daily)   Diet/type: NPO DVT prophylaxis: systemic heparin GI prophylaxis: H2B Lines: PICC Foley:  Yes, and it is still needed Code Status:  Intubated, keep full code Last date of multidisciplinary goals of care discussion [Pending conversations once family arrives] Appreciate palliative care medicine assistance  Labs   CBC: Recent Labs  Lab 05/06/2022 1945 04/28/2022 2001 05/15/2022 0102 04/23/2022 0317 04/27/2022 0404 04/22/2022 0745  WBC 7.0  --   --   --  15.7*  --   NEUTROABS 3.7  --   --   --   --   --   HGB 9.1* 9.5* 11.6* 11.9* 10.9* 11.9*  HCT 28.9* 28.0* 34.0* 35.0* 36.0 35.0*  MCV 87.8  --   --   --  91.6  --   PLT 144*  --   --   --  261  --     Basic Metabolic Panel: Recent Labs  Lab 05/02/2022 1945 04/24/2022 2001 05/05/2022 0102 05/11/2022 0317 04/29/2022 0404 05/14/2022 0745  NA 142 144 139 140 142 142  K 2.6* 2.7* 3.7 4.8 4.6 6.0*  CL 108  --   --   --  106  --   CO2 21*  --   --   --  21*  --   GLUCOSE 175*  --   --   --  447*  --   BUN 12  --   --   --  17  --    CREATININE 0.98  --   --   --  1.66*  --   CALCIUM 9.1  --   --   --  8.3*  --   MG  --   --   --   --  2.4  --   PHOS  --   --   --   --  8.3*  --    GFR: Estimated Creatinine Clearance: 22.7 mL/min (A) (by C-G formula based on SCr of 1.66 mg/dL (H)). Recent Labs  Lab 05/09/2022 1945 05/13/2022 0146 05/20/2022 0404  PROCALCITON  --  <0.10  --   WBC 7.0  --  15.7*  LATICACIDVEN 2.2* 4.8*  --     Liver Function Tests: Recent Labs  Lab 05/11/2022 1945  AST 27  ALT 18  ALKPHOS 75  BILITOT 0.6  PROT 5.7*  ALBUMIN 3.0*   No results for input(s): "LIPASE", "AMYLASE" in the last 168 hours. No results for input(s): "AMMONIA" in the last  168 hours.  ABG    Component Value Date/Time   PHART 7.043 (LL) 05/17/2022 0745   PCO2ART 74.2 (HH) 04/29/2022 0745   PO2ART 77 (L) 04/26/2022 0745   HCO3 20.2 05/08/2022 0745   TCO2 22 05/03/2022 0745   ACIDBASEDEF 11.0 (H) 04/28/2022 0745   O2SAT 87 04/26/2022 0745     Coagulation Profile: Recent Labs  Lab 05/14/2022 1945  INR 1.2    Cardiac Enzymes: No results for input(s): "CKTOTAL", "CKMB", "CKMBINDEX", "TROPONINI" in the last 168 hours.  HbA1C: Hgb A1c MFr Bld  Date/Time Value Ref Range Status  05/15/2022 01:46 AM 6.3 (H) 4.8 - 5.6 % Final    Comment:    (NOTE) Pre diabetes:          5.7%-6.4%  Diabetes:              >6.4%  Glycemic control for   <7.0% adults with diabetes   01/08/2019 10:33 PM 6.5 (H) 4.8 - 5.6 % Final    Comment:    (NOTE) Pre diabetes:          5.7%-6.4% Diabetes:              >6.4% Glycemic control for   <7.0% adults with diabetes     CBG: Recent Labs  Lab 05/12/2022 0143 05/12/2022 0158 04/26/2022 0339 05/11/2022 0740  GLUCAP 411* 428* 359* 278*    Review of Systems:   Unable to obtain secondary to sedation  Past Medical History:  She,  has a past medical history of Acute respiratory failure with hypoxia s/p tracheostomy (07/20/2013), Anemia, Arthritis, B12 deficiency (11/14/2020), Barrett's  esophagus, CAD (coronary artery disease), Cancer (HCC), Carotid artery disease (HCC), Chronic back pain, Colonic obstruction due to suspected colitis; s/p colectomy/colostomy, Complication of anesthesia, COPD (chronic obstructive pulmonary disease) (HCC), Depression, Dizziness, DVT of axillary vein, acute right (HCC), GERD (gastroesophageal reflux disease), Heart murmur, History of bronchitis, History of colon polyps, History of hiatal hernia, History of kidney stones, HTN (hypertension), Hypertension, Intracranial hemorrhage (HCC), Joint pain, Neck pain, Other and unspecified hyperlipidemia, Pneumonia, Protein-calorie malnutrition, severe w/ electrolyte imbalance, SBO (small bowel obstruction) (HCC), and Stroke (HCC) (early 80's).   Surgical History:   Past Surgical History:  Procedure Laterality Date   ABDOMINAL HYSTERECTOMY     APPENDECTOMY     BOWEL RESECTION N/A 08/01/2013   Procedure: SMALL BOWEL RESECTION;  Surgeon: Shelly Rubenstein, MD;  Location: [facility] OR;  Service: General;  Laterality: N/A;   BRONCHIAL BIOPSY  07/02/2020   Procedure: BRONCHIAL BIOPSIES;  Surgeon: Leslye Peer, MD;  Location[facility] ENDOSCOPY;  Service: Pulmonary;;   BRONCHIAL BRUSHINGS  07/02/2020   Procedure: BRONCHIAL BRUSHINGS;  Surgeon: Leslye Peer, MD;  Location[facility] ENDOSCOPY;  Service: Pulmonary;;   BRONCHIAL NEEDLE ASPIRATION BIOPSY  07/02/2020   Procedure: BRONCHIAL NEEDLE ASPIRATION BIOPSIES;  Surgeon: Leslye Peer, MD;  Location: [facility] ENDOSCOPY;  Service: Pulmonary;;   BRONCHIAL WASHINGS  07/02/2020   Procedure: BRONCHIAL WASHINGS;  Surgeon: Leslye Peer, MD;  Location: [facility] ENDOSCOPY;  Service: Pulmonary;;   cataract surgery Bilateral    CHOLECYSTECTOMY     COLON RESECTION     COLON SURGERY     COLOSTOMY N/A 08/01/2013   Procedure: COLOSTOMY;  Surgeon: Shelly Rubenstein, MD;  Location: [facility] OR;  Service: General;  Laterality: N/A;   COLOSTOMY TAKEDOWN  01/24/2014   dr blackman   COLOSTOMY TAKEDOWN N/A  01/24/2014   Procedure: COLOSTOMY TAKEDOWN;  Surgeon: Abigail Miyamoto, MD;  Location:  [facility] OR;  Service: General;  Laterality: N/A;   CORONARY ANGIOPLASTY WITH STENT PLACEMENT  12/20/2012   STENT TO OM         DR COOPER   ENDARTERECTOMY Right 08/08/2014   Procedure: RIGHT CAROTID ENDARTERECTOMY WITH HEMASHIELD PATCH ANGIOPLASTY;  Surgeon: Sherren Kerns, MD;  Location: [facility] OR;  Service: Vascular;  Laterality: Right;   FIDUCIAL MARKER PLACEMENT  07/02/2020   Procedure: FIDUCIAL MARKER PLACEMENT;  Surgeon: Leslye Peer, MD;  Location: [facility] ENDOSCOPY;  Service: Pulmonary;;   FOOT SURGERY Left    HAND SURGERY Bilateral    for ganglion cysts   HEMOSTASIS CONTROL  07/02/2020   Procedure: HEMOSTASIS CONTROL;  Surgeon: Leslye Peer, MD;  Location: [facility] ENDOSCOPY;  Service: Pulmonary;;  ice saline and epi   IR IMAGING GUIDED PORT INSERTION  08/23/2020   LAPAROTOMY N/A 08/01/2013   Procedure: EXPLORATORY LAPAROTOMY;  Surgeon: Shelly Rubenstein, MD;  Location: [facility] OR;  Service: General;  Laterality: N/A;   LEFT HEART CATH AND CORONARY ANGIOGRAPHY N/A 01/10/2019   Procedure: LEFT HEART CATH AND CORONARY ANGIOGRAPHY;  Surgeon: Lennette Bihari, MD;  Location: [facility] INVASIVE CV LAB;  Service: Cardiovascular;  Laterality: N/A;   LEFT HEART CATHETERIZATION WITH CORONARY ANGIOGRAM N/A 12/20/2012   Procedure: LEFT HEART CATHETERIZATION WITH CORONARY ANGIOGRAM;  Surgeon: Micheline Chapman, MD;  [facility] C[facility] LAB;  Service: Cardiovascular;  Laterality: N/A;   PARTIAL COLECTOMY N/A 08/01/2013   Procedure: PARTIAL COLECTOMY;  Surgeon: Shelly Rubenstein, MD;  Location: [facility] OR;  Service: General;  Laterality: N/A;   ROTATOR CUFF REPAIR Left    TONSILLECTOMY     TRACHEOSTOMY     feinstein   VIDEO BRONCHOSCOPY WITH ENDOBRONCHIAL NAVIGATION N/A 07/02/2020   Procedure: VIDEO BRONCHOSCOPY WITH ENDOBRONCHIAL NAVIGATION;  Surgeon: Leslye Peer, MD;  Location: [facility] ENDOSCOPY;  Service: Pulmonary;  Laterality: N/A;     Social  History:   reports that she quit smoking about 22 months ago. Her smoking use included cigarettes. She has a 60.00 pack-year smoking history. She has never used smokeless tobacco. She reports that she does not drink alcohol and does not use drugs.   Family History:  Her family history includes AAA (abdominal aortic aneurysm) in her father; Heart attack in her father; Heart disease in her father and another family member; Varicose Veins in her mother.   Allergies Allergies  Allergen Reactions   Pregabalin Swelling    Tongue swelling   Paclitaxel     Unknown reaction   Sulfonamide Derivatives Swelling    Childhood reaction     Home Medications  Prior to Admission medications   Medication Sig Start Date End Date Taking? Authorizing Provider  apixaban (ELIQUIS) 5 MG TABS tablet Take 5 mg by mouth 2 (two) times daily.   Yes [provider]  atorvastatin (LIPITOR) 80 MG tablet Take 80 mg by mouth every evening.    Yes [provider]  budesonide (PULMICORT) 0.5 MG/2ML nebulizer solution Take 0.5 mg by nebulization in the morning and at bedtime. 02/28/21  Yes [provider]  buPROPion (WELLBUTRIN XL) 150 MG 24 hr tablet Take 1 tablet (150 mg total) by mouth daily. 03/02/15  Yes Lars Masson, MD  diltiazem (CARDIZEM CD) 180 MG 24 hr capsule Take 180 mg by mouth daily.   Yes [provider]  DULoxetine (CYMBALTA) 60 MG capsule Take 60 mg by mouth daily. 11/07/21  Yes [provider]  ferrous sulfate 324 MG TBEC  Take 324 mg by mouth every other day.   Yes [provider]  fish oil-omega-3 fatty acids 1000 MG capsule Take 1,000 mg by mouth in the morning.   Yes [provider]  folic acid (FOLVITE) 400 MCG tablet Take 400 mcg by mouth in the morning.   Yes [provider]  furosemide (LASIX) 40 MG tablet Take 40 mg by mouth every morning. 01/16/22  Yes [provider]  ipratropium-albuterol (DUONEB) 0.5-2.5 (3)  MG/3ML SOLN USE 1 VIAL VIA NEBULIZER EVERY 6 HOURS AS NEEDED Patient taking differently: Take 3 mLs by nebulization every 6 (six) hours as needed (for shortness of breath or wheezing). 04/02/21  Yes Glenford Bayley, NP  levalbuterol Pauline Aus) 1.25 MG/3ML nebulizer solution Inhale 1.25 mg into the lungs every 6 (six) hours as needed for wheezing or shortness of breath. 01/15/22  Yes [provider]  metoprolol succinate (TOPROL-XL) 50 MG 24 hr tablet Take 75 mg by mouth daily. Take with or immediately following a meal.   Yes [provider]  mirtazapine (REMERON) 45 MG tablet Take 45 mg by mouth at bedtime.  06/29/18  Yes [provider]  Multiple Vitamins-Minerals (CENTRUM ADULTS PO) Take by mouth. Centrum silver   Yes [provider]  nitroGLYCERIN (NITROSTAT) 0.4 MG SL tablet DISSOLVE 1 TABLET UNDER THE TONGUE EVERY 5 MINUTES AS  NEEDED FOR CHEST PAIN. MAX  OF 3 TABLETS IN 15 MINUTES. CALL 911 IF PAIN PERSISTS. Patient taking differently: Place 0.4 mg under the tongue every 5 (five) minutes as needed for chest pain. 11/05/20  Yes Nahser, Deloris Ping, MD  ondansetron (ZOFRAN) 4 MG tablet Take 4 mg by mouth every 8 (eight) hours as needed for nausea or vomiting.   Yes [provider]  pantoprazole (PROTONIX) 40 MG tablet Take 40 mg by mouth in the morning.   Yes [provider]  potassium chloride SA (KLOR-CON M) 20 MEQ tablet Take 1 tablet (20 mEq total) by mouth 2 (two) times daily. 05/23/21  Yes Dellia Beckwith, MD  zolpidem (AMBIEN) 5 MG tablet TAKE 1 TABLET BY MOUTH ONCE DAILY AT BEDTIME AS NEEDED FOR SLEEP. Patient taking differently: Take 5 mg by mouth at bedtime as needed for sleep. 04/29/22  Yes Dellia Beckwith, MD  aspirin EC 81 MG tablet Take 2 tablets (162 mg total) by mouth in the morning. Okay to restart this medication on 07/04/2020 Patient not taking: Reported on 03/18/2022 07/02/20   Leslye Peer, MD    Thalia Bloodgood DO   Internal Medicine Resident PGY-3 [place]  Pager: [phone]

## 2022-05-21 NOTE — Progress Notes (Signed)
Initial Nutrition Assessment  DOCUMENTATION CODES:   Not applicable  INTERVENTION:   Tube Feeding via OG: Vital 1.5 at 40 ml/hr Begin TF at rate of 20 ml/hr; titrate by 10 mL q 8 hours until goal rate of 40 ml/hr Pro-Source TF60 mL  This provides 1520 kcals, 85 g of protein and 730 mL of free water  NUTRITION DIAGNOSIS:   Inadequate oral intake related to acute illness, poor appetite as evidenced by per patient/family report, NPO status.  GOAL:   Patient will meet greater than or equal to 90% of their needs  MONITOR:   TF tolerance, Vent status, Labs, Weight trends  REASON FOR ASSESSMENT:   Consult, Ventilator Enteral/tube feeding initiation and management  ASSESSMENT:   82 yo female admitted with acute on chronic respiratory failure requiring intubation. PMH includes advanced COPD on home oxygen, hx lung cancer-in remission), prior trach.  4/28 Admitted, Intubated 4/29 TF initiated  Dietitian Consult received for TF initiation and management  OG tube in place. Abd xray from earlier this AM suggesting advancement by 5-10 cm. No repeat xray. Discussed with RN who is repositioning OG, abd xray now indicating good gastric position  Minimal UOP, Net +1.7 L  Unable to obtain diet and weight history at this time.   Noted pt seen by [facility] RD in January of this year for poor appetite and unintentional wt loss  Noted pt did receive potassium supplement yesterday for hypokalemia, today potassium now up to 6, Received 40 mEq yesterday. Acidosis contributing as well, started on sodium bicarb gtt  Lab Results  Component Value Date   HGBA1C 6.3 (H) 04/29/2022  CBGs 200-300s, also on solumedrol. Noted DM coordinator following  Labs: CBGs 278-359, potassium 6.0 (H), phosphorus 8.3 (H) Meds: solumedrol, sodium bicarb gtt at 75 ml/hr, lasix, ss novolog, semglee  NUTRITION - FOCUSED PHYSICAL EXAM:  Assess on follow-up  Diet Order:   Diet Order              Diet NPO time specified  Diet effective now                   EDUCATION NEEDS:   Not appropriate for education at this time  Skin:  Skin Assessment: Reviewed RN Assessment  Last BM:  PTA, no flatus, abdomen soft, BS hypoactive  Height:   Ht Readings from Last 1 Encounters:  05/05/2022 4\' 11"  (1.499 m)    Weight:   Wt Readings from Last 1 Encounters:  05/05/2022 75.4 kg    BMI:  Body mass index is 33.57 kg/m.  Estimated Nutritional Needs:   Kcal:  1400-1600 kcals  Protein:  75-90 g  Fluid:  1.5 L   Romelle Starcher MS, RDN, LDN, CNSC Registered Dietitian 3 Clinical Nutrition RD Pager and On-Call Pager Number Located in [place]

## 2022-05-21 NOTE — Procedures (Signed)
Patient extubated to comfort care per order.   Extubation Procedure Note  Patient Details:   Name: [name] DOB: 07/25/1940 MRN: [id number]   Airway Documentation:    Vent end date: 04/30/2022 Vent end time: 2224   Evaluation  O2 sats: transiently fell during during procedure Complications: No apparent complications Patient did tolerate procedure well. Bilateral Breath Sounds: Clear   No  Allena Napoleon 05/16/2022, 10:32 PM

## 2022-05-21 NOTE — Progress Notes (Signed)
Peripherally Inserted Central Catheter Placement  The IV Nurse has discussed with the patient and/or persons authorized to consent for the patient, the purpose of this procedure and the potential benefits and risks involved with this procedure.  The benefits include less needle sticks, lab draws from the catheter, and the patient may be discharged home with the catheter. Risks include, but not limited to, infection, bleeding, blood clot (thrombus formation), and puncture of an artery; nerve damage and irregular heartbeat and possibility to perform a PICC exchange if needed/ordered by physician.  Alternatives to this procedure were also discussed.  Bard Power PICC patient education guide, fact sheet on infection prevention and patient information card has been provided to patient /or left at bedside.    PICC Placement Documentation  PICC Triple Lumen 05/05/2022 Right Brachial 35 cm 0 cm (Active)  Indication for Insertion or Continuance of Line Vasoactive infusions 04/30/2022 1413  Exposed Catheter (cm) 0 cm 05/17/2022 1413  Site Assessment Clean, Dry, Intact;Other (Comment) 05/02/2022 1413  Lumen #1 Status Flushed;Saline locked;Blood return noted 04/27/2022 1413  Lumen #2 Status Flushed;Saline locked;Blood return noted 05/03/2022 1413  Lumen #3 Status Flushed;Saline locked;Blood return noted 05/17/2022 1413  Dressing Type Transparent;Securing device 04/27/2022 1413  Dressing Status Antimicrobial disc in place;Clean, Dry, Intact 05/08/2022 1413  Safety Lock Intact 04/25/2022 1413  Line Adjustment (NICU/IV Team Only) No 04/21/2022 1413  Dressing Intervention New dressing;Other (Comment) 05/13/2022 1413  Dressing Change Due 05/26/22 05/08/2022 1413  Telephone consent by Daughter [name] signed consent.    [name] 04/24/2022, 2:16 PM

## 2022-05-21 NOTE — Inpatient Diabetes Management (Signed)
Inpatient Diabetes Program Recommendations  AACE/ADA: New Consensus Statement on Inpatient Glycemic Control (2015)  Target Ranges:  Prepandial:   less than 140 mg/dL      Peak postprandial:   less than 180 mg/dL (1-2 hours)      Critically ill patients:  140 - 180 mg/dL    Latest Reference Range & Units 04/26/2022 01:46  Hemoglobin A1C 4.8 - 5.6 % 6.3 (H)  (H): Data is abnormally high  Latest Reference Range & Units 05/08/2022 01:43 05/07/2022 01:58 05/05/2022 03:39 04/29/2022 07:40  Glucose-Capillary 70 - 99 mg/dL  161 mg Solumedrol @1934  411 (H) 428 (H)  10 units Novolog  359 (H)  15 units Novolog  278 (H)  8 units Novolog  80 mg Solumedrol  (H): Data is abnormally high   Admit with: Acute on chronic hypoxemic resp failure/ AFib w/ RVR  History: Advanced COPD, lung cancer in remission   Current Insulin Orders: Novolog Moderate Correction Scale/ SSI (0-15 units) Q4 hours    MD- Note pt Getting Solumedrol 80 mg BID.  Novolog SSi started early this AM  May consider adding weight based basal insulin to current Insulin regimen:  Semglee 8 units BID (0.2 units/kg split dose)    --Will follow patient during hospitalization--  Ambrose Finland RN, MSN, CDCES Diabetes Coordinator Inpatient Glycemic Control Team Team Pager: [phone] (8a-5p)

## 2022-05-21 NOTE — IPAL (Signed)
  Interdisciplinary Goals of Care Family Meeting   Date carried out: 05/02/2022  Location of the meeting: Conference room  Member's involved: Physician, Bedside Registered Nurse, and Family Member or next of kin  Durable Power of Attorney or acting medical decision maker: family consensus    Discussion: We discussed goals of care for [employer] .  Family was gathered and informed that she was currently on maximal ventilator support and that no other options were realistically available. Option of continuing until tomorrow and reassessing was offered with proviso that she would not receive CPR if she arrested given irriversibility of lung disease. Family stated that she had been declining of late and that they were ready to transition to comfort care and compassionate extubation and allow peaceful natural death.   Code status:   Code Status: DNR   Disposition: In-patient comfort care  Time spent for the meeting:    Lynnell Catalan, MD  05/16/2022, 7:53 PM

## 2022-05-21 NOTE — Progress Notes (Signed)
Directly available for intubation.

## 2022-05-21 DEATH — deceased

## 2022-05-23 LAB — CULTURE, BLOOD (ROUTINE X 2)
Special Requests: ADEQUATE
Special Requests: ADEQUATE

## 2022-05-28 ENCOUNTER — Ambulatory Visit: Payer: 59 | Admitting: Oncology

## 2022-06-05 ENCOUNTER — Ambulatory Visit: Payer: 59 | Admitting: Physician Assistant

## 2022-06-21 NOTE — Discharge Summary (Signed)
DEATH SUMMARY   Patient Details  Name: [name] MRN: [id number] DOB: 1940/09/05  Admission/Discharge Information   Admit Date:  2022-06-05  Date of Death: Date of Death: 2022-06-06  Time of Death: Time of Death: 15-Jun-2251  Length of Stay: 1  Referring Physician: Hurshel Party, NP   Reason(s) for Hospitalization  Respiratory failure    Diagnoses  Preliminary cause of death: congestive heart failure Secondary Diagnoses (including complications and co-morbidities):  Principal Problem:   Respiratory failure (HCC) NSTEMI CHF COPD  Lung Cancer  Brief Hospital Course (including significant findings, care, treatment, and services provided and events leading to death)  [name] is a 82 y.o. year old female who presented with respiratory failure. She required intubation and had severe hypoxic and hypercarbia requiring significant escalation in ventilator support with persistent air hunger. She ruled in for NSTEMI and was felt to have both HF and exacerbation of COPD. Given persistently poor gas exchange on maximal ventilator support, we informed the family of the likely poor outcome especially in light of her chronic lung disease. They endorsed that she had been declining and they then opted to transition to comfort care and compassionate extubation.   Pertinent Labs and Studies  Significant Diagnostic Studies DG Abd Portable 1V  Result Date: 2022-06-06 CLINICAL DATA:  [id number] Encounter for orogastric tube placement [id number] EXAM: PORTABLE ABDOMEN - 1 VIEW COMPARISON:  Radiograph 06/06/2022 FINDINGS: Nasogastric tube has been advanced, side port overlies the body of the stomach, tip overlies the distal stomach. Paucity of bowel gas. Multifocal bilateral airspace disease, as seen on recent chest radiograph and CT. IMPRESSION: Nasogastric tube side port overlies the body of the stomach, tip overlies the distal stomach. Paucity of bowel gas. Electronically Signed   By: Caprice Renshaw  M.D.   On: 06/06/22 12:59   ECHOCARDIOGRAM LIMITED  Result Date: 06-Jun-2022    ECHOCARDIOGRAM REPORT   Patient Name:   [name] [facility] Date of Exam: 2022-06-06 Medical Rec #:  [id number]           Height:       59.0 in Accession #:    [id number]          Weight:       166.2 lb Date of Birth:  [birth year]           BSA:          1.705 m Patient Age:    82 years            BP:           116/53 mmHg Patient Gender: F                   HR:           112 bpm. Exam Location:  Inpatient Procedure: Limited Echo, Limited Color Doppler and Cardiac Doppler Indications:    Pulmonary Embolism; NSTEMI  History:        Patient has prior history of Echocardiogram examinations, most                 recent 10/18/2021. CAD and Acute MI, COPD, Carotid Disease and                 Repiratory failure; Risk Factors:Hypertension, Diabetes and                 Dyslipidemia.  Sonographer:    Dondra Prader RVT RCS Referring Phys: [id number] Vinnie Level SMITH IMPRESSIONS  1. Septal  and apical akinesis with overall moderate to severe LV dysfunction. Limited study; not all views obtained.  2. Left ventricular ejection fraction, by estimation, is 30 to 35%. The left ventricle has moderately decreased function. The left ventricle demonstrates regional wall motion abnormalities (see scoring diagram/findings for description). Left ventricular  diastolic function could not be evaluated.  3. Right ventricular systolic function is mildly reduced. The right ventricular size is normal.  4. The mitral valve is normal in structure. Not assessed mitral valve regurgitation.  5. The aortic valve was not well visualized. Aortic valve regurgitation Not assessed.  6. Pulmonic valve regurgitation Not assessed.  7. The inferior vena cava is normal in size with greater than 50% respiratory variability, suggesting right atrial pressure of 3 mmHg. FINDINGS  Left Ventricle: Left ventricular ejection fraction, by estimation, is 30 to 35%. The left ventricle has moderately  decreased function. The left ventricle demonstrates regional wall motion abnormalities. The left ventricular internal cavity size was normal in size. There is no left ventricular hypertrophy. Left ventricular diastolic function could not be evaluated. Right Ventricle: The right ventricular size is normal. Right ventricular systolic function is mildly reduced. Left Atrium: Left atrial size was normal in size. Right Atrium: Right atrial size was normal in size. Pericardium: Trivial pericardial effusion is present. Mitral Valve: The mitral valve is normal in structure. Not assessed mitral valve regurgitation. Tricuspid Valve: The tricuspid valve is normal in structure. Tricuspid valve regurgitation is mild. Aortic Valve: The aortic valve was not well visualized. Aortic valve regurgitation Not assessed. Pulmonic Valve: The pulmonic valve was not well visualized. Pulmonic valve regurgitation Not assessed. Aorta: The aortic root is normal in size and structure. Venous: The inferior vena cava is normal in size with greater than 50% respiratory variability, suggesting right atrial pressure of 3 mmHg. IAS/Shunts: The interatrial septum was not assessed. Additional Comments: Septal and apical akinesis with overall moderate to severe LV dysfunction. Limited study; not all views obtained.  LEFT VENTRICLE PLAX 2D LVIDd:         4.20 cm LVIDs:         3.25 cm LV PW:         0.90 cm LV IVS:        0.80 cm  RIGHT VENTRICLE            IVC RV S prime:     8.59 cm/s  IVC diam: 1.80 cm TAPSE (M-mode): 1.1 cm RIGHT ATRIUM           Index RA Area:     10.70 cm RA Volume:   23.60 ml  13.84 ml/m  TRICUSPID VALVE TR Peak grad:   39.9 mmHg TR Vmax:        316.00 cm/s Olga Millers MD Electronically signed by Olga Millers MD Signature Date/Time: 05/06/2022/12:34:52 PM    Final    [place] EKG SITE RITE  Result Date: 04/25/2022 If Site Rite image not attached, placement could not be confirmed due to current cardiac rhythm.  CT Angio Chest  Pulmonary Embolism (PE) W or WO Contrast  Result Date: 04/21/2022 CLINICAL DATA:  hypoxia EXAM: CT ANGIOGRAPHY CHEST WITH CONTRAST TECHNIQUE: Multidetector CT imaging of the chest was performed using the standard protocol during bolus administration of intravenous contrast. Multiplanar CT image reconstructions and MIPs were obtained to evaluate the vascular anatomy. RADIATION DOSE REDUCTION: This exam was performed according to the departmental dose-optimization program which includes automated exposure control, adjustment of the mA and/or kV according to patient  size and/or use of iterative reconstruction technique. CONTRAST:  75mL OMNIPAQUE IOHEXOL 350 MG/ML SOLN COMPARISON:  Chest x-ray 04/24/2022 FINDINGS: Cardiovascular: Satisfactory opacification of the pulmonary arteries to the segmental level. No evidence of pulmonary embolism. The main pulmonary artery is enlarged measuring up to 3.2 cm. Prominent heart size. No significant pericardial effusion. The thoracic aorta is normal in caliber. Severe atherosclerotic plaque of the thoracic aorta. Four-vessel coronary artery calcifications. Mediastinum/Nodes: Enlarged mediastinal lymph nodes. No definite hilar axillary lymphadenopathy. Thyroid gland, trachea, and esophagus demonstrate no significant findings. Lungs/Pleura: At least moderate centrilobular emphysematous changes. Interlobular septal wall thickening. Consolidative airspace opacities with air bronchograms most prominent within the dependent portions of the upper and lower lobes. Limited evaluation for pulmonary nodule. No pulmonary nodule. No pulmonary mass. Bilateral trace to small pleural effusions. No pneumothorax. Upper Abdomen: No acute abnormality. Musculoskeletal: No chest wall abnormality. No suspicious lytic or blastic osseous lesions. No acute displaced fracture. Multilevel degenerative changes of the spine. Lines and tubes: Endotracheal tube terminates 3.5 cm above the carina. Enteric tube  courses below the hemidiaphragm with tip and side port terminating within the gastric lumen. Accessed right chest wall Port-A-Cath with tip terminating at the superior cavoatrial junction. Review of the MIP images confirms the above findings. IMPRESSION: 1. Lines and tubes in good position. 2. No pulmonary embolus. 3. Enlarged main pulmonary artery-correlate for pulmonary hypertension. 4. Marked pulmonary edema with bilateral trace to small pleural effusions. Superimposed infection not excluded. Recommend CT chest follow-up in 3 months to evaluate for complete resolution. 5. Mediastinal lymphadenopathy likely reactive in etiology. Recommend attention on follow-up. 6. Aortic Atherosclerosis (ICD10-I70.0) and Emphysema (ICD10-J43.9). Four-vessel coronary calcification. Electronically Signed   By: Tish Frederickson M.D.   On: 04/28/2022 00:50   DG Abd 1 View  Result Date: 05/10/2022 CLINICAL DATA:  Tube placement EXAM: ABDOMEN - 1 VIEW COMPARISON:  CT 08/16/2020 FINDINGS: Airspace disease at the bases. Esophageal tube tip overlies the mid gastric region, side-port just beyond GE junction, further advancement by 5-10 cm could be considered for more optimal positioning. Nonobstructed gas pattern IMPRESSION: Esophageal tube tip overlies the mid gastric region, side-port just beyond GE junction, consider further advancement by 5-10 cm for more optimal positioning. Electronically Signed   By: Jasmine Pang M.D.   On: 05/07/2022 00:31   Portable Chest x-ray  Result Date: 04/29/2022 CLINICAL DATA:  Check endotracheal tube placement EXAM: PORTABLE CHEST 1 VIEW COMPARISON:  Film from the previous day. FINDINGS: Cardiac shadow is stable. Right chest wall port is again seen. Endotracheal tube and gastric catheter are noted in satisfactory position. Significant increased airspace opacity is noted throughout the right lung as well as in the left mid and lower lung. No sizable effusion is seen. No bony abnormality is noted.  IMPRESSION: Significant increased airspace opacity bilaterally right greater than left. This likely represents pulmonary edema given the abrupt onset. Electronically Signed   By: Alcide Clever M.D.   On: 04/21/2022 00:10   DG Chest Port 1 View  Result Date: 05/15/2022 CLINICAL DATA:  Cough and shortness of breath history of lung carcinoma EXAM: PORTABLE CHEST 1 VIEW COMPARISON:  12/19/2021 FINDINGS: Cardiac shadow is stable. Aortic calcifications are noted. Right chest wall port is seen. The patchy infiltrates seen on the prior exam are less well appreciated on today's study. Persistent density in the right hilum is seen in part due to post radiation change. It would be difficult to exclude recurrent neoplasm in this area. Small effusions are noted  likely chronic in nature. No acute bony abnormality is seen. IMPRESSION: Fullness in the right hilar region likely related to post radiation changes although the possibility of underlying soft tissue mass could not be totally excluded. Previously seen bilateral patchy infiltrates have improved in the interval from the prior exam. Electronically Signed   By: Alcide Clever M.D.   On: 04/29/2022 19:53    Microbiology Recent Results (from the past 240 hour(s))  Blood Culture (routine x 2)     Status: None (Preliminary result)   Collection Time: 05/09/2022  7:35 PM   Specimen: BLOOD  Result Value Ref Range Status   Specimen Description BLOOD PORTA CATH  Final   Special Requests   Final    BOTTLES DRAWN AEROBIC AND ANAEROBIC Blood Culture adequate volume   Culture   Final    NO GROWTH 2 DAYS Performed at [facility], [address] [place], [place] [address]    Report Status PENDING  Incomplete  Blood Culture (routine x 2)     Status: None (Preliminary result)   Collection Time: 04/29/2022  7:50 PM   Specimen: BLOOD LEFT WRIST  Result Value Ref Range Status   Specimen Description BLOOD LEFT WRIST  Final   Special Requests   Final    BOTTLES DRAWN  AEROBIC AND ANAEROBIC Blood Culture adequate volume   Culture   Final    NO GROWTH 2 DAYS Performed at [facility], [address] [place], [place] [address]    Report Status PENDING  Incomplete  Resp panel by RT-PCR (RSV, Flu A&B, Covid) Anterior Nasal Swab     Status: None   Collection Time: 05/04/2022  8:05 PM   Specimen: Anterior Nasal Swab  Result Value Ref Range Status   SARS Coronavirus 2 by RT PCR NEGATIVE NEGATIVE Final   Influenza A by PCR NEGATIVE NEGATIVE Final   Influenza B by PCR NEGATIVE NEGATIVE Final    Comment: (NOTE) The Xpert Xpress SARS-CoV-2/FLU/RSV plus assay is intended as an aid in the diagnosis of influenza from Nasopharyngeal swab specimens and should not be used as a sole basis for treatment. Nasal washings and aspirates are unacceptable for Xpert Xpress SARS-CoV-2/FLU/RSV testing.  Fact Sheet for Patients: BloggerCourse.com  Fact Sheet for Healthcare Providers: SeriousBroker.it  This test is not yet approved or cleared by the [place] FDA and has been authorized for detection and/or diagnosis of SARS-CoV-2 by FDA under an Emergency Use Authorization (EUA). This EUA will remain in effect (meaning this test can be used) for the duration of the COVID-19 declaration under Section 564(b)(1) of the Act, 21 U.S.C. section 360bbb-3(b)(1), unless the authorization is terminated or revoked.     Resp Syncytial Virus by PCR NEGATIVE NEGATIVE Final    Comment: (NOTE) Fact Sheet for Patients: BloggerCourse.com  Fact Sheet for Healthcare Providers: SeriousBroker.it  This test is not yet approved or cleared by the [place] FDA and has been authorized for detection and/or diagnosis of SARS-CoV-2 by FDA under an Emergency Use Authorization (EUA). This EUA will remain in effect (meaning this test can be used) for the duration of the COVID-19  declaration under Section 564(b)(1) of the Act, 21 U.S.C. section 360bbb-3(b)(1), unless the authorization is terminated or revoked.  Performed at [facility], [address] [place], [place] [address]   MRSA Next Gen by PCR, Nasal     Status: None   Collection Time: 04/27/2022  1:00 AM   Specimen: Nasal Mucosa;  Nasal Swab  Result Value Ref Range Status   MRSA by PCR Next Gen NOT DETECTED NOT DETECTED Final    Comment: (NOTE) The GeneXpert MRSA Assay (FDA approved for NASAL specimens only), is one component of a comprehensive MRSA colonization surveillance program. It is not intended to diagnose MRSA infection nor to guide or monitor treatment for MRSA infections. Test performance is not FDA approved in patients less than 34 years old. Performed at [facility], [address], [place], [place] [address]   Culture, Respiratory w Gram Stain     Status: None (Preliminary result)   Collection Time: 05/10/2022  1:47 AM   Specimen: Tracheal Aspirate; Respiratory  Result Value Ref Range Status   Specimen Description TRACHEAL ASPIRATE  Final   Special Requests NONE  Final   Gram Stain   Final    RARE WBC PRESENT,BOTH PMN AND MONONUCLEAR NO ORGANISMS SEEN    Culture   Final    NO GROWTH 1 DAY Performed at [facility], [address], [place], [place] [address]    Report Status PENDING  Incomplete  Respiratory (~20 pathogens) panel by PCR     Status: None   Collection Time: 04/30/2022  1:47 AM   Specimen: Nasopharyngeal Swab; Respiratory  Result Value Ref Range Status   Adenovirus NOT DETECTED NOT DETECTED Final   Coronavirus 229E NOT DETECTED NOT DETECTED Final    Comment: (NOTE) The Coronavirus on the Respiratory Panel, DOES NOT test for the novel  Coronavirus (2019 nCoV)    Coronavirus HKU1 NOT DETECTED NOT DETECTED Final   Coronavirus NL63 NOT DETECTED NOT DETECTED Final   Coronavirus OC43 NOT DETECTED NOT DETECTED Final   Metapneumovirus NOT DETECTED NOT  DETECTED Final   Rhinovirus / Enterovirus NOT DETECTED NOT DETECTED Final   Influenza A NOT DETECTED NOT DETECTED Final   Influenza B NOT DETECTED NOT DETECTED Final   Parainfluenza Virus 1 NOT DETECTED NOT DETECTED Final   Parainfluenza Virus 2 NOT DETECTED NOT DETECTED Final   Parainfluenza Virus 3 NOT DETECTED NOT DETECTED Final   Parainfluenza Virus 4 NOT DETECTED NOT DETECTED Final   Respiratory Syncytial Virus NOT DETECTED NOT DETECTED Final   Bordetella pertussis NOT DETECTED NOT DETECTED Final   Bordetella Parapertussis NOT DETECTED NOT DETECTED Final   Chlamydophila pneumoniae NOT DETECTED NOT DETECTED Final   Mycoplasma pneumoniae NOT DETECTED NOT DETECTED Final    Comment: Performed at [facility], [address], [place], [place] [address]    Lab Basic Metabolic Panel: Recent Labs  Lab 05/02/2022 1945 05/10/2022 2001 05/10/2022 0317 05/06/2022 0404 04/30/2022 0745 05/16/2022 1437 05/02/2022 1748 05/16/2022 1802  NA 142   < > 140 142 142 144  --  144  K 2.6*   < > 4.8 4.6 6.0* 5.5*  --  5.0  CL 108  --   --  106  --   --   --   --   CO2 21*  --   --  21*  --   --   --   --   GLUCOSE 175*  --   --  447*  --   --   --   --   BUN 12  --   --  17  --   --   --   --   CREATININE 0.98  --   --  1.66*  --   --   --   --   CALCIUM  9.1  --   --  8.3*  --   --   --   --   MG  --   --   --  2.4  --   --  1.8  --   PHOS  --   --   --  8.3*  --   --  5.6*  --    < > = values in this interval not displayed.   Liver Function Tests: Recent Labs  Lab 05/15/2022 1945  AST 27  ALT 18  ALKPHOS 75  BILITOT 0.6  PROT 5.7*  ALBUMIN 3.0*   No results for input(s): "LIPASE", "AMYLASE" in the last 168 hours. No results for input(s): "AMMONIA" in the last 168 hours. CBC: Recent Labs  Lab 05/04/2022 1945 05/14/2022 2001 05/01/2022 0317 04/26/2022 0404 04/27/2022 0745 05/04/2022 1437 05/14/2022 1802  WBC 7.0  --   --  15.7*  --   --   --   NEUTROABS 3.7  --   --   --   --   --   --   HGB 9.1*    < > 11.9* 10.9* 11.9* 10.9* 10.2*  HCT 28.9*   < > 35.0* 36.0 35.0* 32.0* 30.0*  MCV 87.8  --   --  91.6  --   --   --   PLT 144*  --   --  261  --   --   --    < > = values in this interval not displayed.   Cardiac Enzymes: No results for input(s): "CKTOTAL", "CKMB", "CKMBINDEX", "TROPONINI" in the last 168 hours. Sepsis Labs: Recent Labs  Lab 05/05/2022 1945 05/02/2022 0146 04/29/2022 0404 05/07/2022 1748  PROCALCITON  --  <0.10  --   --   WBC 7.0  --  15.7*  --   LATICACIDVEN 2.2* 4.8*  --  3.5*    Procedures/Operations  Mechanical ventilation.    Kalonji Zurawski 05/21/2022, 7:14 AM
